# Patient Record
Sex: Female | Born: 1975 | Race: White | Hispanic: No | Marital: Single | Smoking: Current some day smoker
Health system: Southern US, Community
[De-identification: ages and names within clinical notes are randomized; demographics above are authoritative.]

## PROBLEM LIST (undated history)

## (undated) DIAGNOSIS — L0390 Cellulitis, unspecified: Secondary | ICD-10-CM

## (undated) DIAGNOSIS — I330 Acute and subacute infective endocarditis: Secondary | ICD-10-CM

## (undated) DIAGNOSIS — E039 Hypothyroidism, unspecified: Secondary | ICD-10-CM

## (undated) DIAGNOSIS — I2699 Other pulmonary embolism without acute cor pulmonale: Secondary | ICD-10-CM

## (undated) HISTORY — PX: OTHER SURGICAL HISTORY: SHX169

## (undated) HISTORY — PX: BELOW KNEE LEG AMPUTATION: SUR23

## (undated) HISTORY — PX: TOE AMPUTATION: SHX809

---

## 1999-02-07 ENCOUNTER — Encounter: Payer: Self-pay | Admitting: Emergency Medicine

## 1999-02-07 ENCOUNTER — Emergency Department (HOSPITAL_COMMUNITY): Admission: EM | Admit: 1999-02-07 | Discharge: 1999-02-07 | Payer: Self-pay | Admitting: Emergency Medicine

## 2001-03-10 ENCOUNTER — Inpatient Hospital Stay (HOSPITAL_COMMUNITY): Admission: EM | Admit: 2001-03-10 | Discharge: 2001-03-11 | Payer: Self-pay | Admitting: Emergency Medicine

## 2002-06-30 ENCOUNTER — Emergency Department (HOSPITAL_COMMUNITY): Admission: EM | Admit: 2002-06-30 | Discharge: 2002-06-30 | Payer: Self-pay | Admitting: Emergency Medicine

## 2002-06-30 ENCOUNTER — Encounter: Payer: Self-pay | Admitting: Emergency Medicine

## 2003-05-07 ENCOUNTER — Emergency Department (HOSPITAL_COMMUNITY): Admission: EM | Admit: 2003-05-07 | Discharge: 2003-05-08 | Payer: Self-pay | Admitting: Emergency Medicine

## 2003-05-08 ENCOUNTER — Encounter: Payer: Self-pay | Admitting: Emergency Medicine

## 2003-07-31 ENCOUNTER — Emergency Department (HOSPITAL_COMMUNITY): Admission: EM | Admit: 2003-07-31 | Discharge: 2003-07-31 | Payer: Self-pay | Admitting: Emergency Medicine

## 2003-08-05 ENCOUNTER — Emergency Department (HOSPITAL_COMMUNITY): Admission: AD | Admit: 2003-08-05 | Discharge: 2003-08-05 | Payer: Self-pay | Admitting: Family Medicine

## 2004-06-08 ENCOUNTER — Emergency Department (HOSPITAL_COMMUNITY): Admission: EM | Admit: 2004-06-08 | Discharge: 2004-06-08 | Payer: Self-pay | Admitting: Emergency Medicine

## 2004-06-13 ENCOUNTER — Inpatient Hospital Stay (HOSPITAL_COMMUNITY): Admission: EM | Admit: 2004-06-13 | Discharge: 2004-06-22 | Payer: Self-pay | Admitting: Emergency Medicine

## 2004-06-18 ENCOUNTER — Ambulatory Visit: Payer: Self-pay | Admitting: Infectious Diseases

## 2004-07-24 ENCOUNTER — Inpatient Hospital Stay (HOSPITAL_COMMUNITY): Admission: EM | Admit: 2004-07-24 | Discharge: 2004-07-30 | Payer: Self-pay | Admitting: Emergency Medicine

## 2004-07-24 ENCOUNTER — Ambulatory Visit: Payer: Self-pay | Admitting: Infectious Diseases

## 2005-12-07 ENCOUNTER — Emergency Department (HOSPITAL_COMMUNITY): Admission: EM | Admit: 2005-12-07 | Discharge: 2005-12-08 | Payer: Self-pay | Admitting: Emergency Medicine

## 2006-04-26 ENCOUNTER — Emergency Department (HOSPITAL_COMMUNITY): Admission: EM | Admit: 2006-04-26 | Discharge: 2006-04-26 | Payer: Self-pay | Admitting: Emergency Medicine

## 2006-05-15 ENCOUNTER — Emergency Department (HOSPITAL_COMMUNITY): Admission: EM | Admit: 2006-05-15 | Discharge: 2006-05-15 | Payer: Self-pay | Admitting: Emergency Medicine

## 2006-09-04 ENCOUNTER — Emergency Department (HOSPITAL_COMMUNITY): Admission: EM | Admit: 2006-09-04 | Discharge: 2006-09-04 | Payer: Self-pay | Admitting: Emergency Medicine

## 2006-09-06 ENCOUNTER — Emergency Department (HOSPITAL_COMMUNITY): Admission: EM | Admit: 2006-09-06 | Discharge: 2006-09-06 | Payer: Self-pay | Admitting: Emergency Medicine

## 2007-03-22 ENCOUNTER — Emergency Department (HOSPITAL_COMMUNITY): Admission: EM | Admit: 2007-03-22 | Discharge: 2007-03-22 | Payer: Self-pay | Admitting: Emergency Medicine

## 2007-05-07 ENCOUNTER — Emergency Department (HOSPITAL_COMMUNITY): Admission: EM | Admit: 2007-05-07 | Discharge: 2007-05-07 | Payer: Self-pay | Admitting: Emergency Medicine

## 2007-05-13 ENCOUNTER — Inpatient Hospital Stay (HOSPITAL_COMMUNITY): Admission: EM | Admit: 2007-05-13 | Discharge: 2007-05-24 | Payer: Self-pay | Admitting: Emergency Medicine

## 2007-05-17 ENCOUNTER — Ambulatory Visit: Payer: Self-pay | Admitting: Infectious Disease

## 2007-05-20 ENCOUNTER — Encounter (INDEPENDENT_AMBULATORY_CARE_PROVIDER_SITE_OTHER): Payer: Self-pay | Admitting: Surgery

## 2007-05-20 ENCOUNTER — Encounter (INDEPENDENT_AMBULATORY_CARE_PROVIDER_SITE_OTHER): Payer: Self-pay | Admitting: Internal Medicine

## 2007-05-20 ENCOUNTER — Ambulatory Visit: Payer: Self-pay | Admitting: Surgery

## 2007-05-21 ENCOUNTER — Encounter (INDEPENDENT_AMBULATORY_CARE_PROVIDER_SITE_OTHER): Payer: Self-pay | Admitting: *Deleted

## 2007-06-12 ENCOUNTER — Ambulatory Visit: Payer: Self-pay | Admitting: *Deleted

## 2007-06-12 ENCOUNTER — Encounter (INDEPENDENT_AMBULATORY_CARE_PROVIDER_SITE_OTHER): Payer: Self-pay | Admitting: Internal Medicine

## 2007-06-12 DIAGNOSIS — E119 Type 2 diabetes mellitus without complications: Secondary | ICD-10-CM | POA: Insufficient documentation

## 2007-06-12 DIAGNOSIS — E109 Type 1 diabetes mellitus without complications: Secondary | ICD-10-CM | POA: Insufficient documentation

## 2007-06-12 DIAGNOSIS — E1165 Type 2 diabetes mellitus with hyperglycemia: Secondary | ICD-10-CM

## 2007-06-12 DIAGNOSIS — E118 Type 2 diabetes mellitus with unspecified complications: Secondary | ICD-10-CM

## 2007-06-12 DIAGNOSIS — Z794 Long term (current) use of insulin: Secondary | ICD-10-CM | POA: Insufficient documentation

## 2007-06-12 DIAGNOSIS — D649 Anemia, unspecified: Secondary | ICD-10-CM | POA: Insufficient documentation

## 2007-06-12 DIAGNOSIS — E785 Hyperlipidemia, unspecified: Secondary | ICD-10-CM | POA: Insufficient documentation

## 2007-06-14 ENCOUNTER — Telehealth (INDEPENDENT_AMBULATORY_CARE_PROVIDER_SITE_OTHER): Payer: Self-pay | Admitting: *Deleted

## 2007-07-30 ENCOUNTER — Encounter: Admission: RE | Admit: 2007-07-30 | Discharge: 2007-07-30 | Payer: Self-pay | Admitting: Surgery

## 2007-12-11 ENCOUNTER — Emergency Department (HOSPITAL_COMMUNITY): Admission: EM | Admit: 2007-12-11 | Discharge: 2007-12-11 | Payer: Self-pay | Admitting: Emergency Medicine

## 2008-02-15 ENCOUNTER — Inpatient Hospital Stay (HOSPITAL_COMMUNITY): Admission: EM | Admit: 2008-02-15 | Discharge: 2008-02-18 | Payer: Self-pay | Admitting: Emergency Medicine

## 2008-02-27 ENCOUNTER — Telehealth: Payer: Self-pay | Admitting: *Deleted

## 2008-04-21 ENCOUNTER — Emergency Department (HOSPITAL_COMMUNITY): Admission: EM | Admit: 2008-04-21 | Discharge: 2008-04-21 | Payer: Self-pay | Admitting: Emergency Medicine

## 2008-04-29 ENCOUNTER — Inpatient Hospital Stay (HOSPITAL_COMMUNITY): Admission: EM | Admit: 2008-04-29 | Discharge: 2008-05-05 | Payer: Self-pay | Admitting: Emergency Medicine

## 2008-05-07 ENCOUNTER — Ambulatory Visit: Payer: Self-pay | Admitting: Family Medicine

## 2008-05-07 ENCOUNTER — Encounter (INDEPENDENT_AMBULATORY_CARE_PROVIDER_SITE_OTHER): Payer: Self-pay | Admitting: Adult Health

## 2008-05-07 LAB — CONVERTED CEMR LAB
INR: 2.7 — ABNORMAL HIGH (ref 0.0–1.5)
Prothrombin Time: 30.9 s — ABNORMAL HIGH (ref 11.6–15.2)

## 2008-06-11 ENCOUNTER — Telehealth (INDEPENDENT_AMBULATORY_CARE_PROVIDER_SITE_OTHER): Payer: Self-pay | Admitting: *Deleted

## 2008-09-30 ENCOUNTER — Inpatient Hospital Stay (HOSPITAL_COMMUNITY): Admission: EM | Admit: 2008-09-30 | Discharge: 2008-10-05 | Payer: Self-pay | Admitting: Emergency Medicine

## 2008-10-01 ENCOUNTER — Ambulatory Visit: Payer: Self-pay | Admitting: Surgery

## 2008-10-01 ENCOUNTER — Encounter (INDEPENDENT_AMBULATORY_CARE_PROVIDER_SITE_OTHER): Payer: Self-pay | Admitting: Internal Medicine

## 2009-03-12 ENCOUNTER — Inpatient Hospital Stay (HOSPITAL_COMMUNITY): Admission: EM | Admit: 2009-03-12 | Discharge: 2009-03-18 | Payer: Self-pay | Admitting: Emergency Medicine

## 2009-03-12 ENCOUNTER — Encounter (INDEPENDENT_AMBULATORY_CARE_PROVIDER_SITE_OTHER): Payer: Self-pay | Admitting: Internal Medicine

## 2009-03-13 ENCOUNTER — Encounter (INDEPENDENT_AMBULATORY_CARE_PROVIDER_SITE_OTHER): Payer: Self-pay | Admitting: Internal Medicine

## 2009-03-15 ENCOUNTER — Encounter (INDEPENDENT_AMBULATORY_CARE_PROVIDER_SITE_OTHER): Payer: Self-pay | Admitting: Internal Medicine

## 2009-03-15 ENCOUNTER — Ambulatory Visit: Payer: Self-pay | Admitting: Vascular Surgery

## 2010-08-14 ENCOUNTER — Encounter: Payer: Self-pay | Admitting: Surgery

## 2010-08-18 IMAGING — CR DG CHEST 2V
2 series · 2 of 2 positions shown · non-contrast
Comparison: Two-view chest x-ray 04/21/2008 and 12/07/2005.

CLINICAL DATA: Shortness of breath.  Wheezing.

CHEST - 2 VIEW 04/29/2008:

[w chest pa]
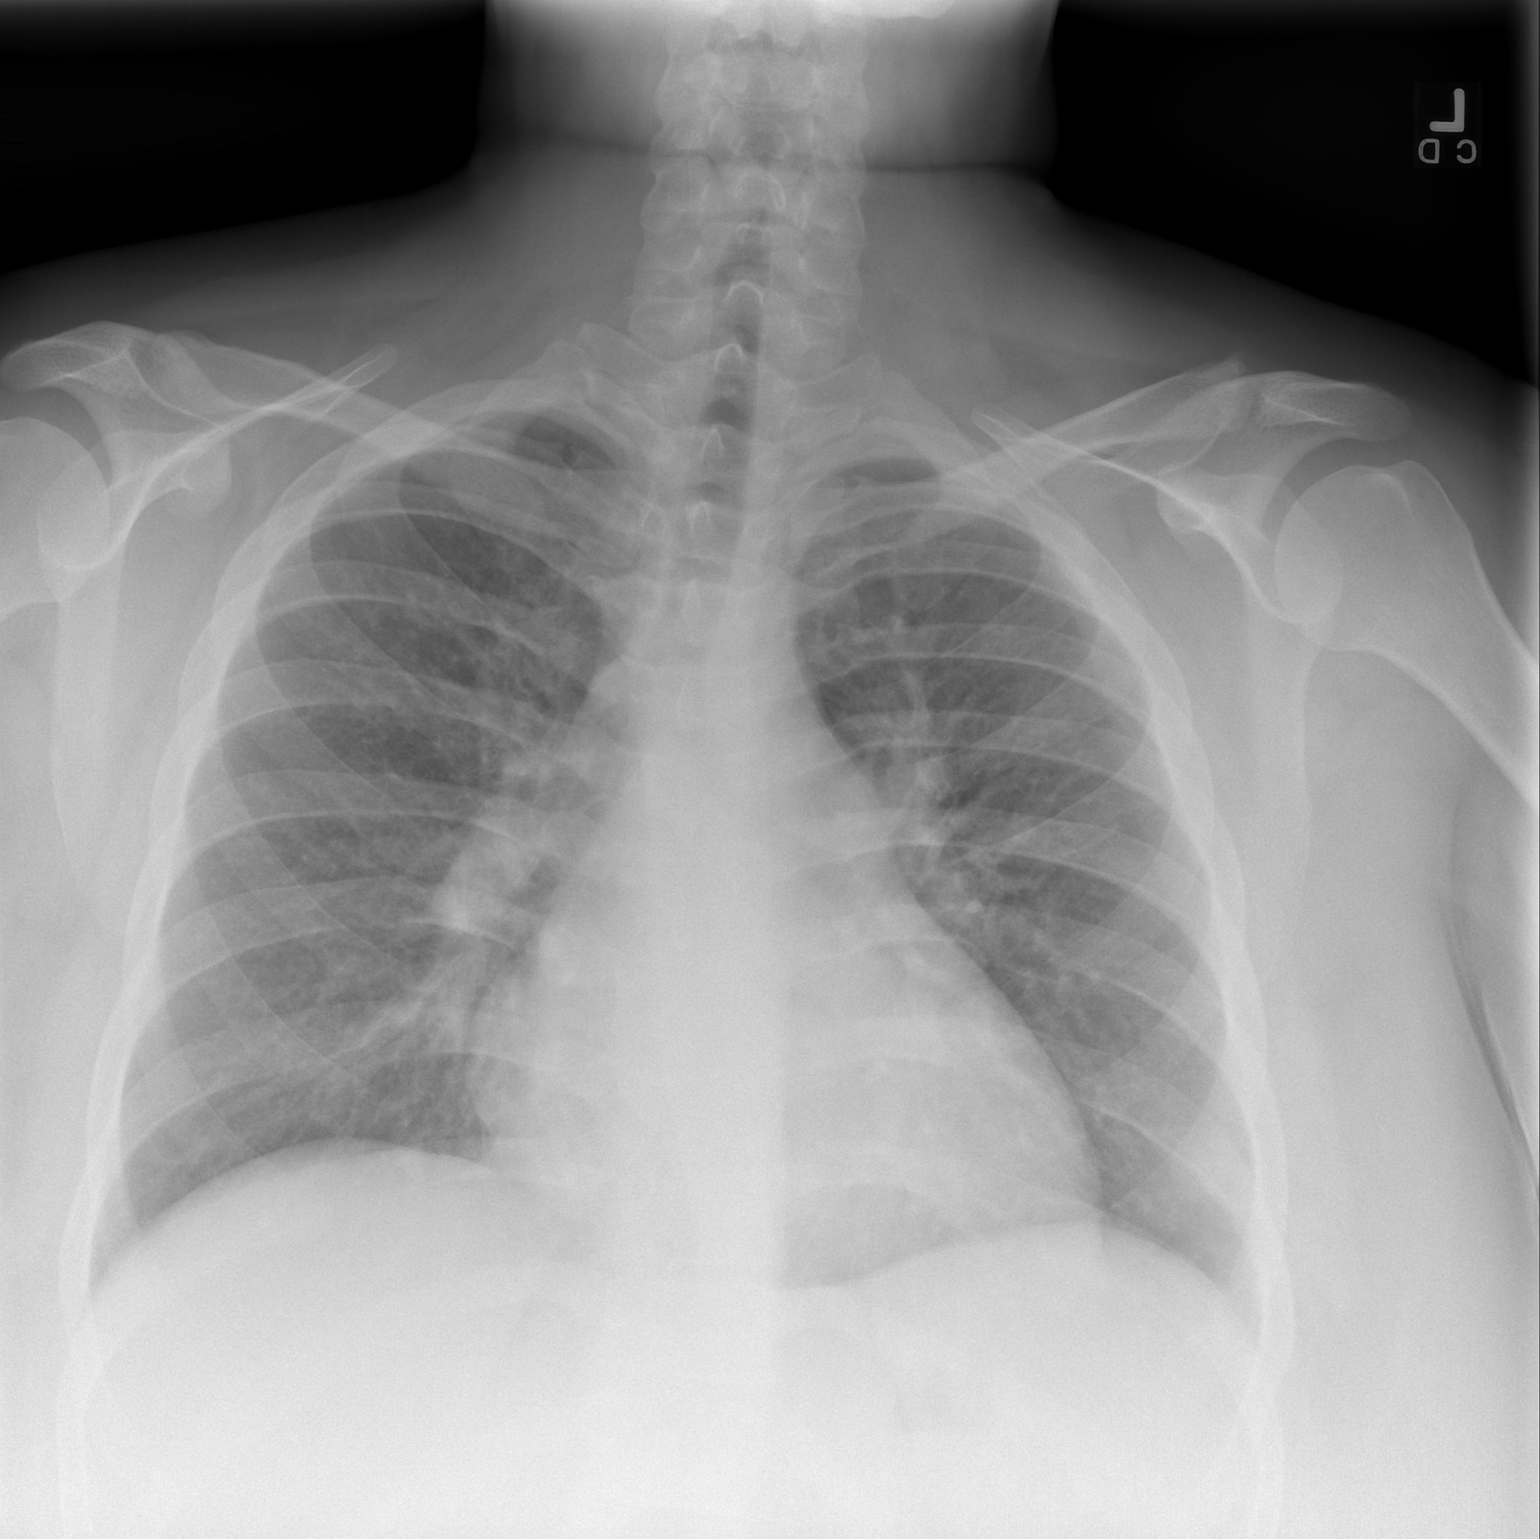

[w chest lat]
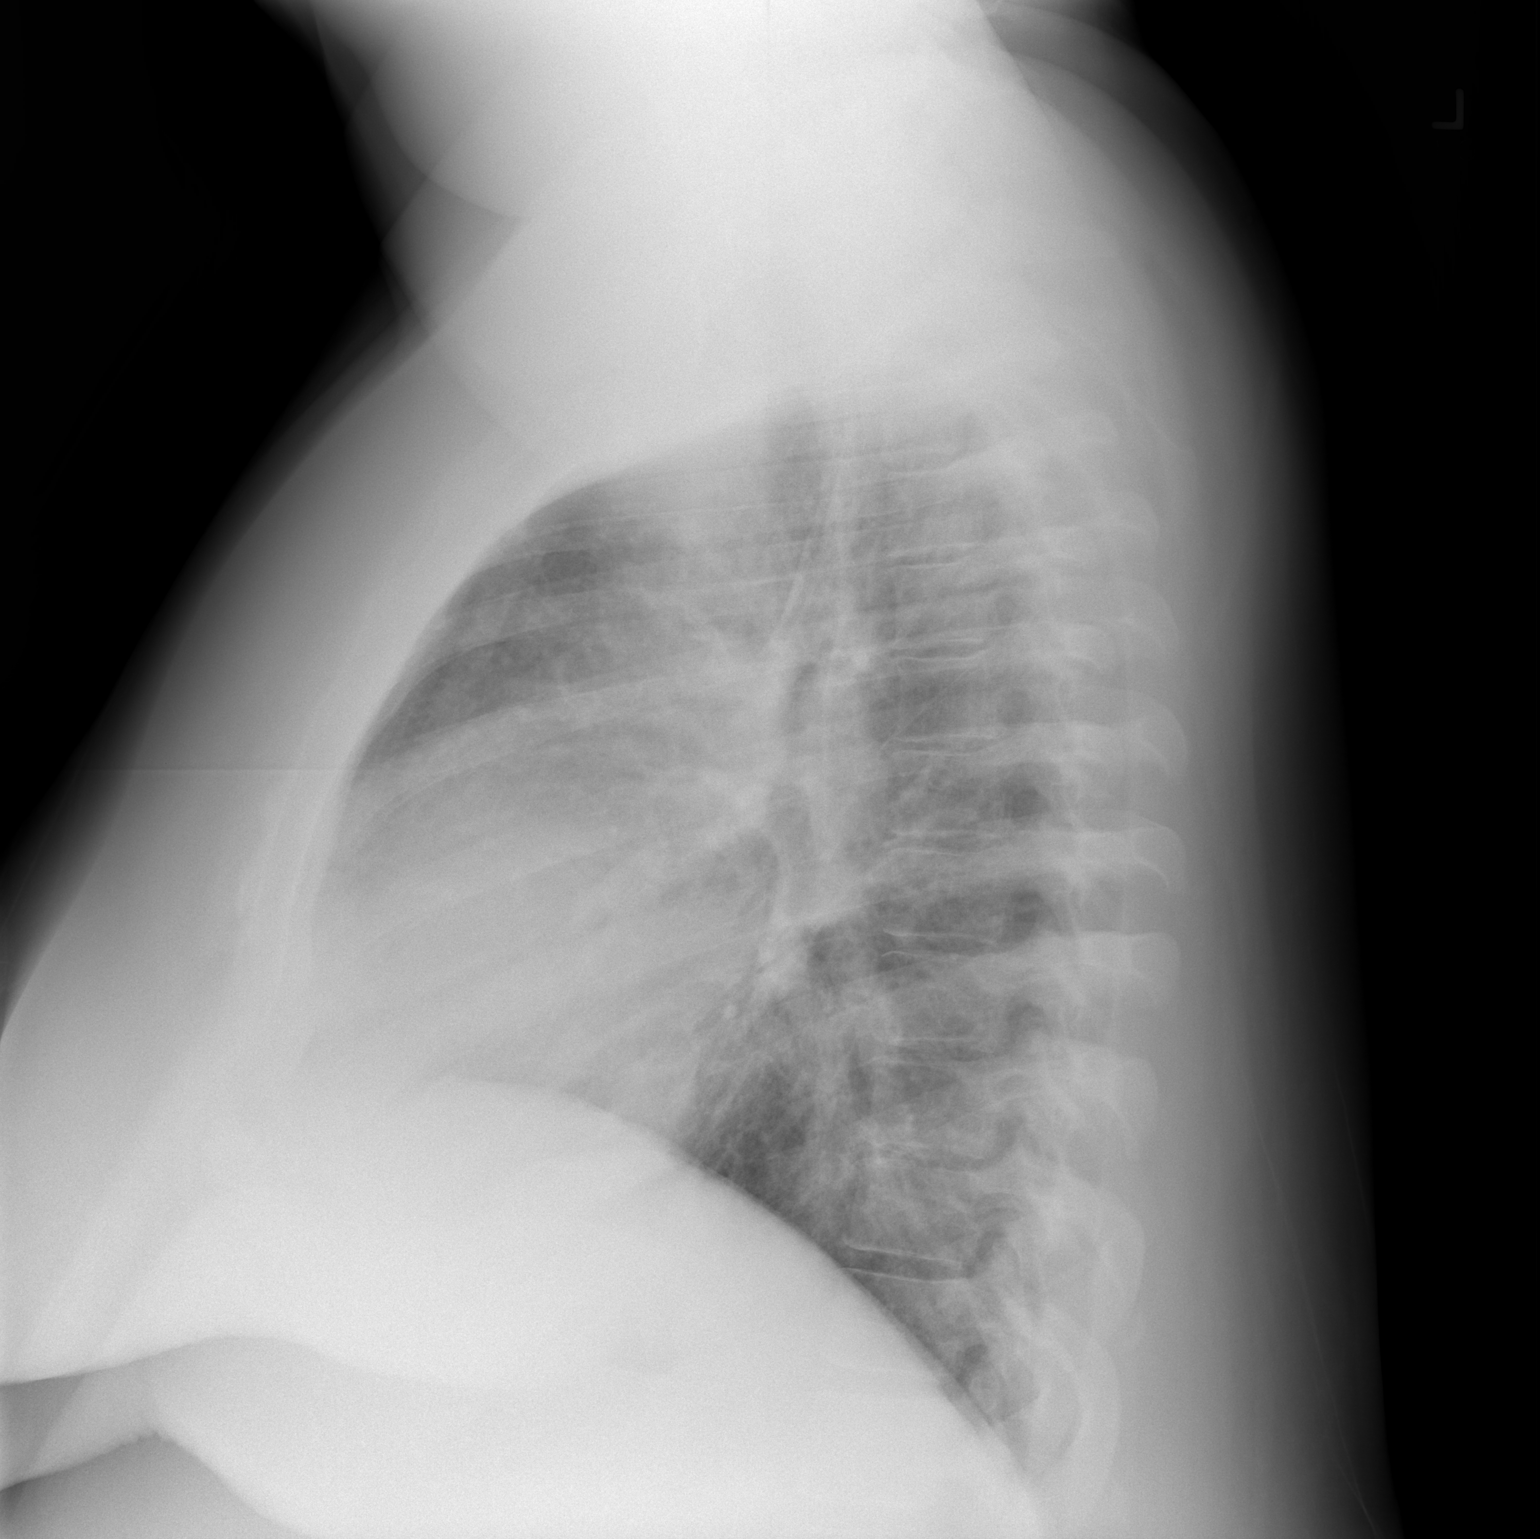

[2 of 2 positions shown; findings below may reference images not displayed]

FINDINGS: Cardiomediastinal silhouette unremarkable and unchanged.
Mildly prominent bronchovascular markings diffusely and mild
central peribronchial thickening, slightly more prominent than on
the prior examinations.  No localized airspace consolidation.  No
pleural effusions.  Visualized bony thorax intact.
IMPRESSION: Mild changes of acute asthma / bronchitis without localized
airspace pneumonia.

## 2010-10-29 LAB — GLUCOSE, CAPILLARY
Glucose-Capillary: 112 mg/dL — ABNORMAL HIGH (ref 70–99)
Glucose-Capillary: 117 mg/dL — ABNORMAL HIGH (ref 70–99)
Glucose-Capillary: 121 mg/dL — ABNORMAL HIGH (ref 70–99)
Glucose-Capillary: 124 mg/dL — ABNORMAL HIGH (ref 70–99)
Glucose-Capillary: 127 mg/dL — ABNORMAL HIGH (ref 70–99)
Glucose-Capillary: 152 mg/dL — ABNORMAL HIGH (ref 70–99)
Glucose-Capillary: 164 mg/dL — ABNORMAL HIGH (ref 70–99)
Glucose-Capillary: 174 mg/dL — ABNORMAL HIGH (ref 70–99)
Glucose-Capillary: 215 mg/dL — ABNORMAL HIGH (ref 70–99)

## 2010-10-29 LAB — URINALYSIS, ROUTINE W REFLEX MICROSCOPIC
Bilirubin Urine: NEGATIVE
Ketones, ur: NEGATIVE mg/dL
Ketones, ur: NEGATIVE mg/dL
Nitrite: NEGATIVE
Nitrite: NEGATIVE
Protein, ur: 100 mg/dL — AB
pH: 5.5 (ref 5.0–8.0)
pH: 5.5 (ref 5.0–8.0)

## 2010-10-29 LAB — COMPREHENSIVE METABOLIC PANEL
AST: 12 U/L (ref 0–37)
Albumin: 3 g/dL — ABNORMAL LOW (ref 3.5–5.2)
Alkaline Phosphatase: 46 U/L (ref 39–117)
Chloride: 97 mEq/L (ref 96–112)
GFR calc Af Amer: >60 mL/min (ref >60)
Potassium: 3.8 mEq/L (ref 3.5–5.1)
Total Bilirubin: 0.8 mg/dL (ref 0.3–1.2)
Total Protein: 7 g/dL (ref 6.0–8.3)

## 2010-10-29 LAB — POCT I-STAT, CHEM 8
Calcium, Ion: 1.12 mmol/L (ref 1.12–1.32)
Creatinine, Ser: 0.5 mg/dL (ref 0.4–1.2)
Glucose, Bld: 233 mg/dL — ABNORMAL HIGH (ref 70–99)
Hemoglobin: 14.3 g/dL (ref 12.0–15.0)
Potassium: 3.9 mEq/L (ref 3.5–5.1)

## 2010-10-29 LAB — DIFFERENTIAL
Basophils Relative: 1 % (ref 0–1)
Basophils Relative: 2 % — ABNORMAL HIGH (ref 0–1)
Lymphocytes Relative: 13 % (ref 12–46)
Lymphocytes Relative: 20 % (ref 12–46)
Lymphs Abs: 1.5 10*3/uL (ref 0.7–4.0)
Lymphs Abs: 1.8 10*3/uL (ref 0.7–4.0)
Monocytes Absolute: 0.6 10*3/uL (ref 0.1–1.0)
Monocytes Relative: 5 % (ref 3–12)
Monocytes Relative: 8 % (ref 3–12)
Neutro Abs: 6.3 10*3/uL (ref 1.7–7.7)
Neutro Abs: 9 10*3/uL — ABNORMAL HIGH (ref 1.7–7.7)
Neutrophils Relative %: 69 % (ref 43–77)
Neutrophils Relative %: 78 % — ABNORMAL HIGH (ref 43–77)

## 2010-10-29 LAB — BASIC METABOLIC PANEL
BUN: 7 mg/dL (ref 6–23)
CO2: 28 mEq/L (ref 19–32)
Calcium: 8.6 mg/dL (ref 8.4–10.5)
Calcium: 8.7 mg/dL (ref 8.4–10.5)
Creatinine, Ser: 0.76 mg/dL (ref 0.4–1.2)
Creatinine, Ser: 0.88 mg/dL (ref 0.4–1.2)
GFR calc Af Amer: >60 mL/min (ref >60)
GFR calc non Af Amer: >60 mL/min (ref >60)
GFR calc non Af Amer: >60 mL/min (ref >60)
Glucose, Bld: 133 mg/dL — ABNORMAL HIGH (ref 70–99)
Sodium: 135 mEq/L (ref 135–145)

## 2010-10-29 LAB — CULTURE, ROUTINE-ABSCESS

## 2010-10-29 LAB — CBC
Hemoglobin: 14.2 g/dL (ref 12.0–15.0)
MCHC: 34.8 g/dL (ref 30.0–36.0)
Platelets: 267 10*3/uL (ref 150–400)
RBC: 4.16 MIL/uL (ref 3.87–5.11)
RBC: 4.52 MIL/uL (ref 3.87–5.11)
RDW: 12.4 % (ref 11.5–15.5)
WBC: 11.5 10*3/uL — ABNORMAL HIGH (ref 4.0–10.5)
WBC: 9.1 10*3/uL (ref 4.0–10.5)

## 2010-10-29 LAB — LIPID PANEL
Cholesterol: 178 mg/dL (ref 0–200)
HDL: 23 mg/dL — ABNORMAL LOW (ref >39)
LDL Cholesterol: 96 mg/dL (ref 0–99)
Total CHOL/HDL Ratio: 7.7 RATIO
Triglycerides: 297 mg/dL — ABNORMAL HIGH (ref <150)
VLDL: UNDETERMINED mg/dL (ref 0–40)

## 2010-10-29 LAB — CULTURE, BLOOD (ROUTINE X 2): Culture: NO GROWTH

## 2010-10-29 LAB — URINE CULTURE
Colony Count: NO GROWTH
Culture: NO GROWTH

## 2010-10-29 LAB — WOUND CULTURE

## 2010-10-29 LAB — URINE MICROSCOPIC-ADD ON

## 2010-10-29 LAB — PROTIME-INR
INR: 0.9 (ref 0.00–1.49)
Prothrombin Time: 12.4 seconds (ref 11.6–15.2)

## 2010-10-29 LAB — ANAEROBIC CULTURE

## 2010-10-29 LAB — HEMOGLOBIN A1C: Mean Plasma Glucose: 186 mg/dL

## 2010-10-29 LAB — APTT: aPTT: 25 seconds (ref 24–37)

## 2010-11-03 LAB — CBC
HCT: 40.5 % (ref 36.0–46.0)
Hemoglobin: 12.6 g/dL (ref 12.0–15.0)
MCHC: 35.7 g/dL (ref 30.0–36.0)
MCHC: 36 g/dL (ref 30.0–36.0)
MCV: 89.9 fL (ref 78.0–100.0)
MCV: 90.7 fL (ref 78.0–100.0)
Platelets: 221 10*3/uL (ref 150–400)
RBC: 3.89 MIL/uL (ref 3.87–5.11)
RBC: 4.51 MIL/uL (ref 3.87–5.11)
RDW: 12.6 % (ref 11.5–15.5)
WBC: 10.8 10*3/uL — ABNORMAL HIGH (ref 4.0–10.5)

## 2010-11-03 LAB — URINALYSIS, ROUTINE W REFLEX MICROSCOPIC
Glucose, UA: >1000 mg/dL — AB
Hgb urine dipstick: NEGATIVE
Ketones, ur: NEGATIVE mg/dL
Protein, ur: NEGATIVE mg/dL
pH: 5.5 (ref 5.0–8.0)

## 2010-11-03 LAB — BASIC METABOLIC PANEL
BUN: 3 mg/dL — ABNORMAL LOW (ref 6–23)
CO2: 25 mEq/L (ref 19–32)
CO2: 25 mEq/L (ref 19–32)
Calcium: 8.4 mg/dL (ref 8.4–10.5)
Chloride: 103 mEq/L (ref 96–112)
Chloride: 93 mEq/L — ABNORMAL LOW (ref 96–112)
Chloride: 96 mEq/L (ref 96–112)
Creatinine, Ser: 0.33 mg/dL — ABNORMAL LOW (ref 0.4–1.2)
Creatinine, Ser: 0.44 mg/dL (ref 0.4–1.2)
Creatinine, Ser: 0.51 mg/dL (ref 0.4–1.2)
GFR calc Af Amer: >60 mL/min (ref >60)
GFR calc Af Amer: >60 mL/min (ref >60)
GFR calc Af Amer: >60 mL/min (ref >60)
GFR calc non Af Amer: >60 mL/min (ref >60)
Glucose, Bld: 401 mg/dL — ABNORMAL HIGH (ref 70–99)
Potassium: 3.9 mEq/L (ref 3.5–5.1)
Potassium: 4.6 mEq/L (ref 3.5–5.1)
Sodium: 123 mEq/L — ABNORMAL LOW (ref 135–145)
Sodium: 126 mEq/L — ABNORMAL LOW (ref 135–145)
Sodium: 127 mEq/L — ABNORMAL LOW (ref 135–145)

## 2010-11-03 LAB — GLUCOSE, CAPILLARY
Glucose-Capillary: 184 mg/dL — ABNORMAL HIGH (ref 70–99)
Glucose-Capillary: 213 mg/dL — ABNORMAL HIGH (ref 70–99)
Glucose-Capillary: 224 mg/dL — ABNORMAL HIGH (ref 70–99)
Glucose-Capillary: 233 mg/dL — ABNORMAL HIGH (ref 70–99)
Glucose-Capillary: 241 mg/dL — ABNORMAL HIGH (ref 70–99)
Glucose-Capillary: 251 mg/dL — ABNORMAL HIGH (ref 70–99)
Glucose-Capillary: 260 mg/dL — ABNORMAL HIGH (ref 70–99)
Glucose-Capillary: 281 mg/dL — ABNORMAL HIGH (ref 70–99)
Glucose-Capillary: 301 mg/dL — ABNORMAL HIGH (ref 70–99)
Glucose-Capillary: 331 mg/dL — ABNORMAL HIGH (ref 70–99)
Glucose-Capillary: 424 mg/dL — ABNORMAL HIGH (ref 70–99)

## 2010-11-03 LAB — COMPREHENSIVE METABOLIC PANEL
CO2: 23 mEq/L (ref 19–32)
Calcium: 8.4 mg/dL (ref 8.4–10.5)
Creatinine, Ser: 0.67 mg/dL (ref 0.4–1.2)
GFR calc non Af Amer: >60 mL/min (ref >60)
Glucose, Bld: 283 mg/dL — ABNORMAL HIGH (ref 70–99)

## 2010-11-03 LAB — CULTURE, BLOOD (ROUTINE X 2): Culture: NO GROWTH

## 2010-11-03 LAB — DIFFERENTIAL
Eosinophils Absolute: 0.2 10*3/uL (ref 0.0–0.7)
Eosinophils Relative: 2 % (ref 0–5)
Lymphocytes Relative: 17 % (ref 12–46)
Lymphs Abs: 1.8 10*3/uL (ref 0.7–4.0)
Monocytes Relative: 3 % (ref 3–12)

## 2010-11-03 LAB — URINE MICROSCOPIC-ADD ON

## 2010-11-03 LAB — RAPID URINE DRUG SCREEN, HOSP PERFORMED
Benzodiazepines: NOT DETECTED
Cocaine: NOT DETECTED
Opiates: POSITIVE — AB

## 2010-11-03 LAB — MAGNESIUM: Magnesium: 2 mg/dL (ref 1.5–2.5)

## 2010-11-03 LAB — CORTISOL: Cortisol, Plasma: 14.2 ug/dL

## 2010-11-03 LAB — TSH: TSH: 2.427 u[IU]/mL (ref 0.350–4.500)

## 2010-12-06 NOTE — H&P (Signed)
NAME:  Karen Bennett, Karen Bennett NO.:  [id number]   MEDICAL RECORD NO.:  [id number]          PATIENT TYPE:  EMS   LOCATION:  ED                           FACILITY:  [facility]   PHYSICIAN:  Arne Cleveland, MD       DATE OF BIRTH:  May 08, 1976   DATE OF ADMISSION:  03/12/2009  DATE OF DISCHARGE:                              HISTORY & PHYSICAL   CHIEF COMPLAINT:  Left foot abscess.   HISTORY OF PRESENT ILLNESS:  Karen Bennett is a 35 year old Caucasian  female who noticed some pain in her left foot which she thought was  reactivation of left fourth and fifth metatarsal fractures.  This  started about 2 months ago.  She put an Ace bandage on her foot and wore  her boot that she was given when she was treated for this in March 2010.  She then started noticing a slight raw area a few weeks later, so she  stopped wearing the boot and only used the Ace bandage.  Two days ago  she developed swelling and pain in the left foot and then last  night/early this morning she had an ulceration on the lateral part of  her left foot over the fifth metatarsal and on the sole under the fifth  metatarsal with drainage and her left fifth toe developed a slight blue  blister with small area of ulceration and draining.  The patient came to  the emergency room because of pain and the abscess draining.   PAST MEDICAL HISTORY:  1. Significant for morbid obesity.  2. Diabetes type 2 which she is not compliant with because she states      she has no insurance.   PAST SURGICAL HISTORY:  Only surgery she had was recently when she broke  her left fifth and fourth metatarsal.  She developed swelling in her  left lower leg and a large hematoma which later became an abscess.  She  had incision and drainage and a tube was put into the abscess cavity and  a wound vac was used to treat her infection.  That is the only surgery  that she has had.   FAMILY HISTORY:  Significant for heart disease and diabetes.  Her  mother  recently had a stroke and a heart attack and is currently at a nursing  facility and recuperating from that.  During that time her father was  found dead at their home and the cause is not sure, but they feel  complications of diabetes.  The patient has been under a lot of anxiety  and stress because of that.   DRUG ALLERGIES:  DOXYCYCLINE causes nausea.   MEDICATIONS:  She only takes over-the-counter ibuprofen.  She does not  take anything for her diabetes.   SOCIAL HISTORY:  She is single.  Her dad recently expired.  Her mom has  had a stroke and heart attack.   REVIEW OF SYSTEMS:  Other than the findings in the history of present  illness, the patient's review of systems is only positive for chronic  medical problems.  No other  positive findings on review of systems.   PHYSICAL EXAMINATION:  GENERAL APPEARANCE:  She is anxious, morbidly  obese in mild distress secondary to pain Caucasian female lying in the  bed.  VITALS:  Temperature 98.4, pulse 102, blood pressure 183/116,  respirations 18.  HEENT:  Head is atraumatic, normocephalic.  Eyes: Pupils are equal,  round and reactive to light.  Disks sharp.  Ears:  External ear canals  and tympanic membranes are normal.  Oropharynx basically normal.  She  does have a tongue piercing.  NECK:  Supple without jugular venous distention, thyromegaly, thyroid  mass or carotid bruit.  CHEST:  Normal to inspection and palpation.  BREASTS/PELVIC/RECTAL:  Exam deferred at the patient's request.  HEART:  Regular rhythm and rate.  Normal S1, S2 without murmur or  gallop.  ABDOMEN:  Morbidly obese, soft, nontender with normoactive bowel sounds.  No hepatomegaly.  No splenomegaly.  No palpable mass.  SKIN/EXTREMITIES:  Normal except for the left foot.  Both legs have  trace edema.  Left leg has more, 1+ from the knee down.  Her left foot  is swollen almost to the ankle.  She has an ulcer on the top of her  fifth metatarsal and also in  the same location, at the base or sole over  the fifth metatarsal.  There is drainage coming from the ulcer and her  fifth toe has a large hematoma bleb and a small area of draining  ulceration.   X-ray of her left foot shows findings of soft tissue ulceration  laterally at the level of the distal fifth metatarsal, subacute healing  fractures involving fourth and fifth metatarsals, marked diffuse tissue  swelling.  No osteolysis involving the fifth metatarsal or phalange of  the fifth toe to suggest osteomyelitis currently.  Joint space is well  preserved.  Spurring arising from the dorsal aspect of the distal  calcification at the insertion of the Achilles tendon posterior  calcaneus.  Impression:  Soft tissue ulceration lateral left fifth  metatarsal, no radiographic evidence of osteomyelitis and subacute  healing fractures of the fourth and fifth metatarsal.   LABORATORY:  CBC:  White count 11.5, hemoglobin 14.3, hematocrit 43,  platelets 114, left shift with 78% neutrophils.  Sed rate is 43.   IMPRESSION:  1. Left foot cellulitis and abscess formation with cellulitis and dark      blue bleb formation and ulceration the left fifth toe.  2. Diabetes non control.  3. Morbid obesity.  4. Tobacco use.   PLAN:  1. Obtain Doppler of the left leg to rule out deep venous thrombosis.  2. Obtain arterial Dopplers of the left leg to assess circulation.  3. MRI of the left foot to rule out osteomyelitis.  4. Obtain surgical evaluation for evaluation and treatment of the left      foot abscess.  5. Start the patient on a 1800 calorie ADA diet.  6. Start the patient on sliding scale with Accu-Cheks before meals and      at bedtime.   The patient is being admitted to the C Team.      Arne Cleveland, MD  Electronically Signed     ML/MEDQ  D:  03/12/2009  T:  03/12/2009  Job:  [id number]

## 2010-12-06 NOTE — Discharge Summary (Signed)
NAME:  Karen Bennett, Karen Bennett               ACCOUNT NO.:  [id number]   MEDICAL RECORD NO.:  [id number]          PATIENT TYPE:  INP   LOCATION:  1318                         FACILITY:  [facility]   PHYSICIAN:  Lonia Blood, M.D.DATE OF BIRTH:  July 21, 1976   DATE OF ADMISSION:  03/12/2009  DATE OF DISCHARGE:  03/18/2009                               DISCHARGE SUMMARY   PRIMARY CARE PHYSICIAN:  Unassigned.   DISCHARGE DIAGNOSES:  1. 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      a.     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      b.     Preoperative vancomycin and Zosyn empirically.      c.     Wound culture from March 12, 2009, confirming methicillin-       resistant staphylococcus aureus (MRSA) susceptible to clindamycin.      d.     Transition to clindamycin oral prior to discharge home.  2. Severely uncontrolled diabetes.      a.     Noncompliance secondary to financial constraints.      b.     CBG well-controlled during hospital stay.      c.     Outpatient referral to diabetes education and assistance       with procurement of medications.  3. Morbid obesity.  4. Reported history of hypothyroidism with normal TSH and free T4      during hospital stay.  5. History of tobacco and marijuana abuse.  The patient advised to      come absolutely discontinue both during hospital stay.  6. Pyuria.  Urine culture and to the urine.  Urinalysis suggestive of      candidal vulvovaginitis.  Empiric treatment.  7. Hypertriglyceridemia.  Treatment Initiated.   DISCHARGE MEDICATIONS:  1. Glucotrol 10 mg p.o. b.i.d.  2. Glucophage 500 mg p.o. b.i.d.  3  Zocor 20 mg p.o. q.h.s.  1. Clindamycin 300 mg p.o. q.8h.  Dose subject to change  2. Oxycodone 5 mg 1 to 2 tablets p.o. q.4h. p.r.n. severe pain.  3. Tylenol 650 mg p.o. q.4h. p.r.n. pain.   FOLLOW UP:  1. Clinical social work has been consulted to assist the patient in   obtaining a primary care physician.  This will be of critical      importance as the patient's CBG's must be very strictly controlled      and followed to assure maximal wound healing.  Additionally, the      patient will need follow-up LFTs and lipid assessment in 6 to 8      weeks, as Zocor therapy has been initiated.  2. The patient will follow up with Dr. Aldean Baker, her orthopedic      surgeon, in 1 to 2 weeks.  She is to call his office at [phone].  3. Wound care:  The patient is instructed to keep her current dressing      intact until she follows up with Dr. Lajoyce Corners in  1 to 2 weeks, as      discussed above.   PROCEDURE:  1. CT scan of the left lower extremity on March 14, 2009, forefoot      cellulitis with probable small superficial abscess dorsal and      lateral to the mid fifth metatarsal.  Effusion and synovial      enhancement of the fifth metatarsal phalangeal joint suspicious for      infection.  Early osteomyelitis of the fifth metatarsal head cannot      be excluded.  Healing fractures of the fourth and fifth metatarsal      shafts.  2. ABIs of bilateral lower extremities, March 15, 2009.  ABIs greater      than 1.0 bilaterally.  3  Left lower extremity venous Doppler, March 15, 2009.  No obvious  DVT.   HOSPITAL COURSE:  [name] is a pleasant 35 year old female,  who secondary to severe financial restraints, has suffered with severe  uncontrolled diabetes.  Additionally, she suffered an injury in March  2010 which was diagnosed as left fourth and fifth metatarsal fractures.  Approximately 2 months prior to her admission in the hospital, she began  to experience significant pain on the lateral aspect of her left foot.  She began to treat this at home with different wraps and wearing a  pressure-relieving blue.  A few weeks later, she began to notice  significant erythema of the lateral foot 2 days prior to her admission.  This progressed to include  severe pain as well as ulceration of the  lateral aspect of the foot and the fifth metatarsal.  She then presented  to the [facility] Emergency Room.  The patient was felt to be suffering  with significant diabetic foot infection.  Wound cultures were  accomplished.  The patient was found to be suffering with MRSA  cellulitis.  The patient was admitted to the acute units.  She was  treated empirically with vancomycin and Zosyn.  Diabetes medications  were adjusted to assure for a strict blood sugar control.  Given the  patient's history of hypothyroidism, a TSH and free T4 was assessed.  These were found to be well within the normal range without the patient  on replacement medications for over a year.  Attempts were made to  accomplish an MRI, but because of the patient's morbid obesity, this was  not able to be accomplished.  CT scan of the foot was therefore  accomplished.  Results are as noted above.  ABIs and left lower  extremity venous Dopplers were carried out with results as noted above.  Ultimately, it was felt that the patient would need surgical  debridement.  Dr. Aldean Baker of orthopedic surgery was consulted.  Dr.  Lajoyce Corners took the patient to the OR on March 16, 2009 and ultimately  performed a left foot fourth and fifth ray amputation.  The patient  tolerated the procedure very well.  CBGs were strictly controlled in the  perioperative period.  The patient improved clinically on her empiric  antibiotic therapy.   At the present time, the patient is medically stable.  She is afebrile  and tolerating a regular diet.  CBGs are much improved.  The patient is  suffering some pain, but this is currently controllable.  The patient  will be cleared for discharge home once it is proven that she will  remain afebrile on oral antibiotics.  These are being initiated tonight.  Additionally, Social  Work will be consulted to assist with outpatient  follow-up and assure that the patient  has all assistance available to  procure her medications and appropriate medical care.  As noted above,  hypertriglyceridemia was noted during this hospital stay with  triglyceride levels in excess of 400.  As a result, Zocor therapy has  been initiated.   It is anticipated that Ms. Keegan will be discharged the hospital on  March 18, 2009.  Any medication adjustments from those listed above  will be dictated in a follow-up addendum as necessary.      Lonia Blood, M.D.  Electronically Signed     JTM/MEDQ  D:  03/17/2009  T:  03/17/2009  Job:  [id number]

## 2010-12-06 NOTE — Discharge Summary (Signed)
NAME:  Karen Bennett, Karen Bennett               ACCOUNT NO.:  [id number]   MEDICAL RECORD NO.:  [id number]          PATIENT TYPE:  INP   LOCATION:  1536                         FACILITY:  [facility]   PHYSICIAN:  Hind I Elsaid, MD      DATE OF BIRTH:  10/01/1975   DATE OF ADMISSION:  02/15/2008  DATE OF DISCHARGE:  02/18/2008                               DISCHARGE SUMMARY   DISCHARGE DIAGNOSES:  1. Acute pyelonephritis/urinary tract infection.  2. Diabetes mellitus, uncontrolled with hemoglobin C1 of 11.  3. Nausea and vomiting, resolved.  4. Constipation, resolved.  5. Hypertension.  6. Morbid obesity.  7. Right hip pain, improving, felt to be secondary to sciatica or      nerve compression.   DISCHARGE MEDICATIONS:  1. Cipro 500 mg p.o. q.12 h for 4 days.  2. Insulin/Lantus 25 units subcu q.h.s.  3. Lisinopril 10 mg daily.  4. Darvocet 1-2 tablets p.o. q.6 h for two weeks.  5. Ibuprofen 400 mg q.6 h p.r.n.  6. Laxative.   PROCEDURES:  Abdominal x-ray, no acute findings.   HISTORY OF PRESENT ILLNESS:  This is a 35 year old Caucasian female,  morbidly obese, presented with chief complaint of flank/abdominal pain.  Also, with nausea and vomiting and constipation.  I planned to do a CT  of the abdomen and pelvis, but secondary to overweight could not be  done.  Found to have blood sugars of 304.  Patient admitted for further  evaluation.  1. The patient admitted as the case of UTI/viral nephritis.  Placed on      Cipro.  Urinalysis and culture were collected.  It was      insufficient.  Patient placed on Cipro with complete resolution of      her symptoms.  The patient will be treated empirically with Cipro      p.o. for another four days.  During hospitalization, the patient      has no further abdominal pain and we did not feel a need for CT of      abdomen and pelvis as necessary.  The patient had blood workup that      was completely normal with normal lactic acid and normal liver  function.  2. Diabetes mellitus.  The patient was found to have significantly      high CBG with hemoglobin C1 of 11.  The patient apparently was      diagnosed in 2008, but she is not on any medication.  Diabetic      coordinator contacted, and diabetic teaching was done during      hospitalization.  The patient, also, will have an appointment with      [facility] for further management of her hyperglycemia.  Diabetic      education was done during hospitalization.  3. Obesity.  Nutrition and dietician consultation was done, also      during hospitalization.  4. Constipation which has resolved.  The patient had good bowel      movement during hospital stay.  5. Hyponatremia.  Completely resolved during hospitalization.  Sodium  level today is 131.  6. Right hip pain.  No further workup was done for that, and the      patient will receive pain medication, mainly Darvocet and      ibuprofen.  We feel that the right hip pain secondary to overweight      with possibility of      osteoarthritis or degenerative joint.  There are no white blood      cells or fever, and the pain is really very slight in nature.  The      patient was asked to follow with [facility] if pain continued.   DISPOSITION:  We felt that the patient was medically stable to be  discharged home.      Hind Bosie Helper, MD  Electronically Signed     HIE/MEDQ  D:  02/18/2008  T:  02/18/2008  Job:  [id number]

## 2010-12-06 NOTE — Discharge Summary (Signed)
NAME:  Karen Bennett, TONKINSON                ACCOUNT NO.:  [id number]   MEDICAL RECORD NO.:  [id number]          PATIENT TYPE:  INP   LOCATION:  1538                         [facility]ACILITY:  [facility]   PHYSICIAN:  Herbie Saxon, MDDATE OF BIRTH:  1976-03-11   DATE OF ADMISSION:  05/12/2007  DATE OF DISCHARGE:                               DISCHARGE SUMMARY   ADDENDUM:  This is an Addendum to the Interim Discharge Summary for [facility]   DISCHARGE DIAGNOSES:  Will include:  1. Left thigh cellulitis, rule out underlying fluid collection or      abscess, rule out underlying deep vein thrombosis.  2. Morbid obesity.  3. Diabetes mellitus.  4. Vulvovaginitis.  5. Anemia of chronic disease.  6. History of methicillin-resistant Staphylococcus aureus infection,      status post incision and drainage left thigh.  7. History of tobacco abuse.  8. Dietary indiscretion.  9. Dyslipidemia.      Herbie Saxon, MD  Electronically Signed     MIO/MEDQ  D:  05/19/2007  T:  05/19/2007  Job:  [id number]

## 2010-12-06 NOTE — Op Note (Signed)
NAME:  Karen Bennett, Karen Bennett               ACCOUNT NO.:  [id number]   MEDICAL RECORD NO.:  [id number]          PATIENT TYPE:  INP   LOCATION:  1318                         FACILITY:  [facility]   PHYSICIAN:  Nadara Mustard, MD     DATE OF BIRTH:  1976-05-29   DATE OF PROCEDURE:  03/16/2009  DATE OF DISCHARGE:                               OPERATIVE REPORT   PREOPERATIVE DIAGNOSIS:  Abscess osteomyelitis, left foot.   POSTOPERATIVE DIAGNOSIS:  Abscess osteomyelitis, left foot.   PROCEDURE:  1. Left foot fifth ray amputation.  2. Left foot fourth ray amputation.   ANESTHESIA:  General.   ESTIMATED BLOOD LOSS:  Minimal.   ANTIBIOTICS:  Vancomycin and Zosyn preoperatively.   DRAINS:  None.   COMPLICATIONS:  None.   TOURNIQUET TIME:  None.   DISPOSITION:  To PACU in stable condition.   PROCEDURE:  The patient is a 35 year old woman with diabetic insensate  neuropathy who has had a several-month history and ulcer over the  plantar lateral aspect of the left foot beneath the fifth metatarsal  head.  The patient was admitted on 08/20, started on IV antibiotics to  decrease the cellulitis ascending up her leg and once the cellulitis was  stabilized, the patient presents at this time for surgical intervention.  Risks and benefits of surgery were discussed including persistent  infection, neurovascular injury, nonhealing wound, need for a higher-  level amputation.  The patient states she understands and wishes to  proceed at this time.  Her BMI is 69.   DESCRIPTION OF PROCEDURE:  The patient was brought to OR room two and  underwent a general LMA anesthetic.  After an adequate level of  anesthesia was obtained the patient's left lower extremity was prepped  using DuraPrep and draped into a sterile field.  The fifth ray was  amputated with the ulcers and one block of tissue.  There was a deep  abscess and necrosis of the fifth metatarsal head with complete  destruction of the bone from the  chronic osteomyelitis.  Deep cultures  were obtained.  There was a section that extended over to the fourth  metatarsal.  Due to the extension of the abscess of the fourth  metatarsal the fourth ray was also amputated.  The wound was irrigated  with normal saline.  After the amputation there was good bleeding and  granulation tissue in the wound bed and no evidence of any further  abscess.  The wound was then closed using 2-0 nylon with a far-near-near-  far suture.  The wound approximated well without tension on the skin.  The wound was covered with Adaptic, orthopedic sponges,  AB dressing, Webril and a Coban dressing was wrapped from her proximal  tibia to the toes.  The patient was then extubated, taken to PACU in  stable condition.  Plan for physical therapy, progressive ambulation  with a Darco shoe, touchdown weightbearing on the left.      Nadara Mustard, MD  Electronically Signed     MVD/MEDQ  D:  03/16/2009  T:  03/16/2009  Job:  [id number] 

## 2010-12-06 NOTE — Consult Note (Signed)
NAME:  Karen Bennett, AUER                ACCOUNT NO.:  [id number]   MEDICAL RECORD NO.:  [id number]          PATIENT TYPE:  EMS   LOCATION:  ED                           FACILITY:  [facility]   PHYSICIAN:  Alfonse Ras, MD   DATE OF BIRTH:  04-04-1976   DATE OF CONSULTATION:  03/22/2007  DATE OF DISCHARGE:                                 CONSULTATION   CHIEF COMPLAINT:  Abdominal pain.   HISTORY OF PRESENT ILLNESS:  The patient is an extremely, morbidly obese  35 year old white female, who comes in with a 3 day history of abdominal  pain on the right side radiating to her right flank.  She was seen by  Dr. Deretha Emory, and I was asked to further evaluate the patient to rule  out appendicitis.  The patient was weighed and was too large to be  weighed because she weighed over 450 pounds.  She describes her pain as  in the right mid abdomen radiating to her right flank.  She denies any  acholic stools or dark urine.  She denies any fevers at home.  She has  been having some nausea and vomiting at home.   PAST MEDICAL HISTORY:  Is none.   MEDICATIONS:  Are none.   LABORATORY DATA:  Laboratory values show a white count 11.8 thousand,  hemoglobin of 15.6.   Urinalysis does show positive for urine nitrates and urine WBC's were 11  to 20.   REVIEW OF SYSTEMS:  Is negative for dysuria.  Other review of systems  are positive as above.   PHYSICAL EXAM:  She is an age appropriate, morbidly obese white female  in minimal distress.  Her temperature is 99.6, blood pressure is 157/79.  Her heart rate is initially 85 and then 110, respiratory rate is 20.  Limited abdominal exam shows tenderness in the right upper quadrant  which is quite exquisite; however, minimal right lower quadrant  tenderness.  No other abdominal tenderness.   IMPRESSION:  Right upper quadrant tenderness, pain and nausea.  Normal  liver function test.   PLAN:  Ultrasound of the right upper quadrant to rule out  cholelithiasis.  I do not think the patient has appendicitis.  However,  without being able to CT scan the patient.  I really think the first  step should be ultrasound of the right upper quadrant.  Pending those  results, if that shows gallstones and a mildly thickened gallbladder  wall, we can certainly send her home on p.o. antibiotics and follow-up  with her for an elective cholecystectomy at some point.  We will await  those results.      Alfonse Ras, MD  Electronically Signed    KRE/MEDQ  D:  03/22/2007  T:  03/24/2007  Job:  [id number]

## 2010-12-06 NOTE — H&P (Signed)
NAME:  Karen Bennett, Karen Bennett               ACCOUNT NO.:  [id number]   MEDICAL RECORD NO.:  [id number]          PATIENT TYPE:  EMS   LOCATION:  ED                           FACILITY:  [facility]   PHYSICIAN:  Hind I Elsaid, MD      DATE OF BIRTH:  1975-12-30   DATE OF ADMISSION:  02/14/2008  DATE OF DISCHARGE:                              HISTORY & PHYSICAL   CHIEF COMPLAINT:  Nausea, vomiting, and abdominal pain for six days.   HISTORY OF PRESENT ILLNESS:  This is a 35 year old Caucasian female with  morbid obesity who presented to the emergency room with a chief  complaint of right flank abdominal pain that started suddenly on Monday.  The pain was 8/10.  It did radiate from the right flank to the right  lower abdomen.  Condition associated with nausea and vomiting.  The  patient admitted she vomits whatever she eats.  She denies any  hematemesis.  Vomiting is greenish to clear on color.  Patient denies  any fever.  Also, condition associated with constipation.  Last bowel  movement was on Monday.  Patient denies any surgical history to the  abdomen.  Patient denies any burning micturition.  At this time, patient  admitted pain mainly at the right lower abdomen.  The patient's pain is  sharp.  In the emergency room, plan of doing CT abdomen and pelvis for  evaluation of her cause of abdominal pain was unable to be done  secondary to overweight.  Patient was admitted for further evaluation of  her nausea, vomiting, and abdominal pain.   PAST MEDICAL HISTORY:  1. Morbid obesity.  2. History of MRSA.  3. Diabetes mellitus.  Patient noncompliant with her diabetes      medication.  She denies being on any diabetes.  4. History of anemia of chronic disease and dyslipidemia.   MEDICATIONS:  None.   ALLERGIES:  DOXYCYCLINE.   PAST SURGICAL HISTORY:  Incision and drainage of left thigh abscess.   SOCIAL HISTORY:  Patient works at the [employer].  Denies any  smoking.  Denies any alcohol  abuse.  Denies any IV drug abuse.  She is  single.  She lives with her mother.   FAMILY HISTORY:  Significant for diabetes.   REVIEW OF SYSTEMS:  Patient denies any headache.  Denies loss of  consciousness.  Denies blurring of vision or seizure activity.  Denies  any shortness of breath or chest pain.  Denies any weakness.  Denies any  joint pain.   LAB WORKUP:  White blood cells on the urine 11-20, RBCs 0-5, urine  glucose more than 1000, small leukocytes.  Lipase 15, sodium 130,  potassium 3.7, chloride 96, glucose 304, BUN 5, creatinine 0.9.  CBC:  White blood cells 7.6, hemoglobin 14.2, hematocrit 40.5, platelets 270.   As we mentioned, CT scan cannot be done secondary to overweight.   PHYSICAL EXAMINATION:  VITAL SIGNS:  Temperature 98.2, blood pressure  187/109, pulse rate 97, respiratory rate 20, saturating 98% on room air.  HEENT:  Patient lying comfortably in bed, not in  respiratory distress or  shortness of breath.  Morbidly obese lady.  Mucosa moist.  No  lymphadenopathy.  No JVD.  LUNGS:  Normal fascicular breathing with equal air entry.  HEART:  S1 and S2 without any murmur or gallop.  ABDOMEN:  Markedly obese.  Soft.  Some tenderness at the right lower  quadrant with tenderness at the costophrenic angle.  Bowel sounds  positive.  Could not appreciate any organomegaly.  EXTREMITIES:  Peripheral pulses intact.  There is no lower limb edema.  CNS:  Patient alert and oriented x3 without focal neurological findings.  RECTAL:  Empty rectal vault.   ASSESSMENT/PLAN:  1. This is a morbidly obese female who presented with nausea,      vomiting, and abdominal pain, possibility of pyelonephritis.      Cannot rule out acute abdominal process.  Patient was admitted to      the hospital, kept on a clear-liquid diet.  Will start the patient      on Cipro, Flagyl, and pain medications.  Supplement antiemetics.      Will get an x-ray of the abdomen, upright and flat.  Will get       lactic acid level and bicarb level.  Will begin the patient on      Cipro and Flagyl to cover for the possibility of pyelonephritis.      The patient needs to have a CT of the abdomen and pelvis.  Will      arrange that with an open CT scan.  If symptoms worsening, will      consult surgery.  2. Diabetes mellitus:  Patient diagnosed with diabetes mellitus since      2008.  She is not on any diabetic regimen.  The cause is unclear.      Will place the patient on a small dose of insulin, Lantus, and      sliding scale.  Oral hypoglycemic agent.  Will get hemoglobin A1C.  3. Hyperlipidemia:  Will get lipid profile and start the patient on a      statin.  4. Obesity:  Counseling.  Nutrition consult.  5. Hyponatremia, possibly secondary to hyperglycemia.  Will repeat.  6. Hypokalemia:  Would replace.  7. Deep venous thrombosis and gastrointestinal prophylaxis.      Hind Bosie Helper, MD  Electronically Signed     HIE/MEDQ  D:  02/15/2008  T:  02/15/2008  Job:  [id number]

## 2010-12-06 NOTE — H&P (Signed)
NAME:  Karen Bennett, Karen Bennett               ACCOUNT NO.:  [id number]   MEDICAL RECORD NO.:  [id number]          PATIENT TYPE:  INP   LOCATION:  0104                         FACILITY:  [facility]   PHYSICIAN:  Lucita Ferrara, MD         DATE OF BIRTH:  [birth year]   DATE OF ADMISSION:  04/29/2008  DATE OF DISCHARGE:                              HISTORY & PHYSICAL   PRIMARY CARE PHYSICIAN:  None.  The patient is a morbidly obese Caucasian female who presented to [facility] with  a chief complaint of shortness of breath  accompanied by pleuritis.  The patient's supposedly was diagnosed with  bronchitis last week, was given Bactrim and albuterol as an outpatient  and also nebulizer treatments and regardless of this outpatient  management, the patient still has shortness of breath with a progressive  cough.  The  patient denies recent travel, trauma to her lower  extremities.  Denies smoking, and denies hypercoagulable state or being  diagnosed with hypercoagulable syndrome or lupus.  She also denies  pregnancy.  Also denies for contraceptive pills.  She was evaluated in  the emergency room and was found to have pulmonary embolus which she  never had before.   REVIEW OF SYSTEMS:  Her review of systems otherwise negative.  She  denies fevers, chills, nausea, vomiting.  She denies any lower extremity  swelling.   PAST MEDICAL HISTORY:  1. History of pyelonephritis and urinary tract infection.  2. Diabetes which is uncontrolled with hemoglobin A1c of 11.  3. Morbidly obese.  4. History of MRSA.  5. History of left thigh abscess, status post incision and drainage,      February 2008.  6. History of tobacco abuse.  No longer smokes.   PAST SURGICAL HISTORY:  As above, abscess drainage.   SOCIAL HISTORY:  No longer smokes, no drugs or alcohol.   ALLERGIES:  Allergic to DOXYCYCLINE.   MEDICATIONS AT HOME:  None.  The patient states that she cannot afford  any of her medications.  She is  on not any prescription medications  right now.   PHYSICAL EXAMINATION:  GENERAL:  The patient is a morbidly obese female.  She is in no acute distress.  HEENT: She is normocephalic, atraumatic.  Sclerae nonicteric.  Extraocular muscles intact.  NECK:  Supple.  No JVD.  No carotid bruits.  CARDIOVASCULAR:  S1 and S2. Regular rate and rhythm.  No murmurs, rubs.  LUNGS:  Although due to her body habitus, it is very hard to hear the  lung sounds or the heart sounds.  ABDOMEN:  Morbidly obese.  Soft,  nontender.  I could hear some bowel sounds.  NEURO:  Patient is oriented x3.  Cranial nerves II-XII grossly intact.   LABORATORY DATA:  Her EKG shows normal sinus rhythm.  Normal ST-T waves  unchanged from May 19, 2008.  Her glucose was 266, D-dimer was  elevated to 0.4. Beta natriuretic peptide 552.  Her glucose was found to  be 330 here.  Hemoglobin 30.9, hematocrit 41.9 and her troponin is less  than 0.05.  Her chest x-ray shows mild changes of acute asthma and  bronchitis without localization of any type of pneumonia.  And her first  set of cardiac markers were essentially negative.  Her CT angiography  showed extensive bilateral pulmonary embolism, patchy densities in both  lungs.  Could be small infarctions or superimposed infection.  Critical  test results verified by emergency room physician.   ASSESSMENT:  A 35 year old, morbidly obese female with:  1. Shortness of breath and pleuritis with bilateral pulmonary embolism      diagnosed here in the emergency room.  No hemodynamic compromise or      instability.  2. Morbid obesity.  3. Diabetes type 2, uncontrolled.  4. Noncompliance with medical care including diabetes.  5. Increased beta natriuretic peptide with known congestive heart      failure and fluid overload.  The chest x-ray showing pulmonary      edema.   DISCUSSION AND PLAN:  1. Pulmonary embolism, likely secondary to immobility  given the      patient's body  habitus.  The patient states that she has not been      very mobile.  Question value of getting lower extremity Dopplers      secondary to body habitus.  The patient regardless is treated with      Lovenox and Coumadin per pharmacy protocol.  The patient needs to      establish with a primary care physician for compliance reasons and      to check coags.  Was initiated on Coumadin.  This needs to be      discussed with social services to see if we can set her up with      [facility].  I question the value getting hypercoagulable profile      in a patient that has obvious risk factors for DVT/PE.  Thus we      will go ahead and proceed.  Also question getting a 2-D      echocardiogram given her body habitus may not be very beneficial.      The patient has no hemodynamic compromise at this point. We may      proceed with getting a 2-D echocardiogram and/or ccm consult, if      there is hypotension, tachycardia or hypoxemia, or other signs of      hemodynamic compromise.  2. Diabetes.  Will initiate sliding scale insulin, hemoglobin A1c,      Lantus, and diabetic teaching.   Further plans really depend on her progress.      Lucita Ferrara, MD  Electronically Signed     RR/MEDQ  D:  04/29/2008  T:  04/30/2008  Job:  [id number]

## 2010-12-06 NOTE — Discharge Summary (Signed)
NAMEKERRYANN, Karen Bennett               ACCOUNT NO.:  [id number]   MEDICAL RECORD NO.:  [id number]          PATIENT TYPE:  INP   LOCATION:  1509                         [facility]ACILITY:  [name]   PHYSICIAN:  Lonia Blood, M.D.DATE OF BIRTH:  1976-03-21   DATE OF ADMISSION:  09/30/2008  DATE OF DISCHARGE:  10/05/2008                               DISCHARGE SUMMARY   PRIMARY CARE PHYSICIAN:  [facility].   DISCHARGE DIAGNOSES:  1. Left lower extremity presumed community-acquired methicillin-      resistant Staphylococcus aureus cellulitis.      a.     Lower extremity Doppler evaluation was negative for deep       vein thrombosis.      b.     Plain film x-rays of the left tibia and fibula without       evidence of underlying osteomyelitis.      c.     Improved with empiric IV vancomycin titrated to p.o. Septra.  2. Severely uncontrolled diabetes mellitus.  3. Morbid obesity.  4. Hypothyroidism.  5. Tobacco abuse.  6. Marijuana abuse.   DISCHARGE MEDICATIONS:  1. Glucotrol 10 mg p.o. b.i.d.  2. Glucophage 1000 mg p.o. b.i.d.  3. Synthroid 25 mcg p.o. daily.  4. Lantus insulin 45 units subcutaneous nightly.  5. Avandia 2 mg b.i.d.  6. NovoLog 4 units t.i.d. with meals.  7. Septra DS one b.i.d. for 7 days then stop.  8. Zofran 4 mg q.4h. p.r.n. nausea.  9. Vicodin 5/500 one to two every 6 hours p.r.n. pain.  10.Aspirin 325 mg a day over-the-counter.   FOLLOW UP:  The patient is scheduled with a follow up with Dr. Audria Nine  at [facility] on November 13, 2008, at 10:30 a.m.  At the time, the  patient's CBG log should be reassessed and hemoglobin A1c should be  reassessed.  The patient's left lower extremity should be assessed to  assure that her cellulitis has completely resolved.  The patient is  advised that should she develop recurrence of severe erythema of the  left lower extremity prior to this time that she should call [facility]  for an earlier appointment or present to the  emergency room.   PROCEDURE:  Left lower extremity Doppler evaluation - no evidence of  DVT.   HOSPITAL COURSE:  Ms. Lindie Roberson is a 35 year old female with  previously known diabetes who is morbidly obese.  She presented to the  hospital on the date of her admission with complaints of severe left  lower extremity pain, swelling and tenderness.  Clinical exam suggested  a significant cellulitis.  Plain film x-rays were carried out and  revealed no evidence of underlying bony osteomyelitis or periosteal  lifting.  Doppler evaluation was accomplished and revealed no evidence  of a DVT.  The patient was empirically placed on IV vancomycin therapy.  Her significant erythema improved steadily during the ongoing use of her  vancomycin therapy.  Blood cultures were obtained and were unrevealing.  At such time that the patient had improved sufficiently she was titrated  over to p.o. Septra.  She tolerated  this without difficulty.  With 24-  hour additional hours of oral therapy only the patient's erythema  continued to improve.  At the time of her discharge there is essentially  no erythema and only a moderate amount of persistent induration of the  previously noted severe erythema and band-like induration of the left  lower extremity.  The region effected was approximately 4 inches  superior to the ankle and primarily in the anterior tibial region.   During the hospital stay the patient was also noted to have severely  uncontrolled diabetes.  She admitted to noncompliance with medications  at home and reported this was a financial issue.  It was noted however,  that the patient could afford to continue to smoke cigarettes and was  smoking marijuana as well.  The patient was counseled as to the extreme  importance of ongoing use of her diabetes medicines and for strict  control of her blood sugar.  She was prescribed medications that are  mostly available on the $4.00 list from both [facility]  and [facility]  pharmacies.  CBG improved significant with titration of diabetes  medications.  The patient was advised that she should check her CBG on a  b.i.d. basis at home and record the values.  She was advised to be very  strictly compliant with a diabetic diet and her medication regimen.   During the hospital stay the patient was also found to have  TSH in  excess of 5.  Low-dose Synthroid was initiated.  A follow up TSH is  recommended in 6-8 weeks.      Lonia Blood, M.D.  Electronically Signed     JTM/MEDQ  D:  10/05/2008  T:  10/05/2008  Job:  [id number]   cc:   Melvern Banker  Fax: [phone]

## 2010-12-06 NOTE — Consult Note (Signed)
NAME:  Karen Bennett, Karen Bennett                ACCOUNT NO.:  [id number]   MEDICAL RECORD NO.:  [id number]          PATIENT TYPE:  INP   LOCATION:  1538                         FACILITY:  [facility]   PHYSICIAN:  Wilmon Arms. Corliss Skains, M.D. DATE OF BIRTH:  October 02, 1975   DATE OF CONSULTATION:  05/14/2007  DATE OF DISCHARGE:                                 CONSULTATION   CONSULTING PHYSICIAN:  Herbie Saxon, MD   REASON FOR CONSULTATION:  Cellulitis left thigh.   HISTORY:  The patient is a 35 year old female who is massively obese and  has had very limited previous medical care.  Last week on May 07, 2007, she presented to the emergency department for drainage of a  subcutaneous abscess on the left abdominal wall.  Last Thursday she  began noticing some swelling and pain in her left thigh.  This worsened  and she presented to the emergency department on Sunday evening.  She  was admitted on May 13, 2007, and was placed on Cubicin.  We are  asked to consult today regarding possible incision and drainage..  Of  note, the patient is too large to fit in the CT scanner or MRI.   PAST MEDICAL HISTORY:  New diagnosis of diabetes with a hemoglobin A1c  greater than 9, super-super morbid obesity with an estimated BMI over  77.   PAST SURGICAL HISTORY:  None.   ALLERGIES:  DOXYCYCLINE.   MEDICATIONS:  1. Lovenox for DVT prophylaxis.  2. Senokot.  3. Glucophage.  4. Glipizide, sliding scale insulin.  5. Cubicin.   SOCIAL HISTORY:  The patient smokes a half-a-pack a day x12 years.  Denies alcohol or drug use.   PHYSICAL EXAMINATION:  VITAL SIGNS:  T max 100.0, T current 98.3, pulse  92, blood pressure 132/89, saturations 100% on room air.  GENERAL:  This is a massively obese female in no apparent distress.  HEENT:  EOMI.  Sclerae anicteric.  NECK:  Obese.  Midline trachea.  No palpable masses.  LUNGS:  Distant breath sounds, clear bilaterally.  Normal respiratory  effort.  HEART:  Regular  rate and rhythm.  No murmur.  ABDOMEN:  Massively obese.  Healing noninfected wound left upper abdomen  with minimal drainage.  EXTREMITIES:  The patient's left thigh was edematous and erythematous  medially.  There were no obvious punctate openings.  There was no sign  of fluctuance.  This does not appear to traverse above the inguinal  crease.  In the emergency department they drew a line around the area of  cellulitis and the erythema seems to be decreased.   LABS:  White count today 17.4 which is down from 18.1, hemoglobin 12.9,  platelet count 248.  Sodium 128, potassium 3.7, blood sugar 233, BUN 3,  creatinine 0.57, hemoglobin A1c is 9.3.  The patient had an ultrasound  of the left thigh performed yesterday which showed some edema in the  muscle bundles of the left medial thigh but no discrete abscess or mass.  No drainable fluid collection.   IMPRESSION:  1. Cellulitis of the left thigh with no  clear signs of abscess and no      indications for drainage.  2. Super-super morbid obesity with BMI greater than 77.  3. Poorly controlled diabetes.   RECOMMENDATIONS:  1. No surgical indications at this time.  Continue antibiotics.      Optimize blood sugar control and correct electrolytes.  2. Will follow along with you to see if there are any indications for      surgery.  If she does have incision and drainage there will be some      considerable wound care issues due to her poorly controlled      diabetes and her obesity.      Wilmon Arms. Tsuei, M.D.  Electronically Signed     MKT/MEDQ  D:  05/14/2007  T:  05/15/2007  Job:  [id number]   cc:   Herbie Saxon, MD

## 2010-12-06 NOTE — H&P (Signed)
NAME:  Karen Bennett, Karen Bennett               ACCOUNT NO.:  [id number]   MEDICAL RECORD NO.:  [id number]          PATIENT TYPE:  INP   LOCATION:  0113                         FACILITY:  [facility]   PHYSICIAN:  Pedro Earls, MD     DATE OF BIRTH:  April 28, 1976   DATE OF ADMISSION:  09/30/2008  DATE OF DISCHARGE:                              HISTORY & PHYSICAL   ADMITTING TEAM:  Team [facility], [facility].   CHIEF COMPLAINT:  Left lower extremity pain, swelling, and tenderness.   HISTORY OF PRESENT ILLNESS:  This is a 35 year old white female patient  with a past medical history significant for diabetes type 2 and morbid  obesity who had not been taking her medication for diabetes.  Was  admitted with a chief complaint of left lower extremity edema and pain.  According to the patient, many years ago she had a hematoma in the left  lower extremity which was drained.  Subsequently it got infected, and  patient had a catheter put in as well.  It had healed until a week ago  when patient started noticing some swelling in the left lower extremity  at the site of the previous surgery.  The swelling kept getting worse,  to the extent that today, patient's whole left lower extremity is  swollen with warmth, tenderness, and the redness.  Patient was unable to  ambulate due to the severe pain.  He was also feeling nauseous.  Patient  also feeling thirsty and having to urinate multiple times a day.  Patient has no appetite as well.   REVIEW OF SYSTEMS:  As above.  The rest of the review of systems were  negative.   PAST MEDICAL HISTORY:  1. Morbid obesity.  2. Diabetes, which is uncontrolled.  3. Smoking addiction.   PAST SURGICAL HISTORY:  Patient had a wound VAC placed in the left upper  extremity for the wound infection and also incision and drainage at the  infected hematoma of the left lower extremity.   ALLERGIES:  DOXYCYCLINE, makes her sick to her stomach.   SOCIAL HISTORY:  Smokes 5 to 6 cigarettes  per day for 15 years.  No  alcohol.  No IV drug abuse.   FAMILY HISTORY:  Positive for diabetes and hypotension.   MEDICATIONS:  None.   PHYSICAL EXAMINATION:  VITALS:  Temperature 98, blood pressure 142/74,  pulse 96, respirations 16, pulse ox 96% on room air.  Patient is awake, alert and oriented x3.  Does appear to be in some  distress secondary to the pain in her left lower extremity.  HEENT:  Pupils are equal, round and reactive to light.  No icterus.  No  pallor.  Extraocular muscles are intact.  Oral mucosa is dry.  NECK:  Supple.  No JVD.  No lymphadenopathy.  CVS:  S1 and S2.  Regular.  No murmurs, heaves, or gallops.  CHEST:  Clear.  ABDOMEN: Soft, obese.  Nontender.  Bowel sounds are present.  No  hepatosplenomegaly.  EXTREMITIES:  Peripheral pulses are present.  No clubbing or cyanosis.  Patient has a left lower  extremity edema, warm with tenderness and  erythema.  CNS;  Motor and sensory grossly intact.  Cranial nerves II-XII are  intact.  SKIN:  As above.  MUSCULOSKELETAL:  Unremarkable.   LABS:  Patient's white count is 10.8 with a left shift.  Platelet count  is 235.  Sodium is 127.   X-ray of the left tibial and fibula did not reveal any bony abnormality.  Patient had advanced tricompartmental degenerative changes in the knee.   Blood cultures are pending.   Chest x-ray is pending.   IMPRESSION:  1. Left lower extremity cellulitis.  2. Uncontrolled diabetes type 2.  3. Morbid obesity.  4. Hypotension secondary to dehydration as a result of hyperglycemia.  5. Smoking and nicotine addiction.   PLAN:  Admit the patient to med-surg.  IV Ancef.  Blood cultures.  Insulin sliding scale with basal Lantus.  Continue IV fluids with normal  saline at 125 an hour.  Pain management.  Repeat CBC and BMP.  An 1800-  calorie ADA diet.      Pedro Earls, MD  Electronically Signed     NS/MEDQ  D:  09/30/2008  T:  09/30/2008  Job:  [id number]

## 2010-12-06 NOTE — Op Note (Signed)
NAME:  KSENIA, KUNZ                ACCOUNT NO.:  [id number]   MEDICAL RECORD NO.:  [id number]          PATIENT TYPE:  INP   LOCATION:  1538                         FACILITY:  [facility]   PHYSICIAN:  Wilmon Arms. Corliss Skains, M.D. DATE OF BIRTH:  04/11/1976   DATE OF PROCEDURE:  05/20/2007  DATE OF DISCHARGE:                               OPERATIVE REPORT   PREOPERATIVE DIAGNOSES:  1. Left thigh abscess.  2. Super, super morbid obesity.   POSTOPERATIVE DIAGNOSES:  1. Left thigh abscess.  2. Super, super morbid obesity.   PROCEDURE PERFORMED:  Incision and drainage of left thigh abscess.   SURGEON:  Wilmon Arms. Corliss Skains, M.D., FACS   ANESTHESIA:  General.   INDICATIONS:  The patient is a 35 year old female who is massively obese  with a body mass index greater than 75.  She presented a week ago with  uncontrolled diabetes and left thigh cellulitis.  The patient was too  large to fit inside a CT scan or MRI.  She has been followed with  ultrasounds which never showed a fluid collection.  The patient has had  minimal improvement on IV antibiotics.  Today's ultrasound showed a  fluid collection.  The decision was made to come operating room for  incision and drainage.   DESCRIPTION OF PROCEDURE:  The patient is brought to the operating room,  placed in supine position on her bed.  She was too large to move over to  the operating room table.  Her left medial thigh was exposed.  After an  adequate level of general anesthesia was obtained, the patient's left  thigh was prepped with Betadine and draped in sterile fashion.  A  longitudinal incision was made over the most indurated area.  Dissection  was carried down through about 3 inches of subcutaneous fat.  Blunt  dissection was used to dissect into the very large abscess cavity.  Approximately 4 mL of foul-smelling purulent drainage was suctioned out.  The wound extended both medially and laterally but was all superficial  to the muscle.  Then a  circle of skin was excised to provide better  exposure to the abscess cavity.  The abscess cavity was thoroughly  irrigated with pulse lavage with 3 liters normal saline.  The wound was  then packed with two Kerlix gauze rolls.  An ABD dressing was applied.  The patient was extubated and brought to recovery in stable condition.  All sponge, instrument, needle counts correct.      Wilmon Arms. Tsuei, M.D.  Electronically Signed     MKT/MEDQ  D:  05/20/2007  T:  05/21/2007  Job:  [id number]

## 2010-12-06 NOTE — Consult Note (Signed)
NAME:  Karen Bennett, Karen Bennett               ACCOUNT NO.:  [id number]   MEDICAL RECORD NO.:  [id number]          PATIENT TYPE:  INP   LOCATION:  1318                         FACILITY:  [facility]   PHYSICIAN:  Nadara Mustard, MD     DATE OF BIRTH:  04-23-1976   DATE OF CONSULTATION:  03/12/2009  DATE OF DISCHARGE:                                 CONSULTATION   HISTORY OF PRESENT ILLNESS:  The patient is a 35 year old woman who  states that she had an ulcer develop approximately 2 months ago.  She  states that this did not seem bad until recently when she started having  more drainage, redness and warmth in the left foot.  She was seen in the  emergency room today for evaluation and treatment of the worsening left  foot ulcer.  Vital signs:  Blood pressure 183/126, pulse 103,  respiratory rate 18, temperature 98.4.   PAST MEDICAL HISTORY:  1. Significant for hypertension.  2. Diabetes.  3. Bronchitis.  4. BMI of 69.  5. History of MRSA.   FAMILY HISTORY:  Noncontributory.   SOCIAL HISTORY:  She lives at home.  Does smoke.   ALLERGIES:  DOXYCYCLINE.   LABORATORY DATA:  UA is positive for a protein of 109 mg/dL,  urobilinogen 0.2, nitrates negative, leukocyte esterase small, urine  glucose 250.  BUN of 12, creatinine 0.5 with glucose of 233, hemoglobin  14.3 with a white blood cell count of 11.5.   DIAGNOSTICS:  Radiographs show soft tissue ulceration of the left foot,  but no evidence of any chronic osteomyelitis.   PHYSICAL EXAMINATION:  The patient has a good dorsalis pedis pulse.  She  has cellulitis involving the lateral aspect of the left half of the left  foot.  She has gangrene of the left fifth toe.  There is a Wagner grade  3 ulcer which probes to bone over the lateral aspect of the fifth  metatarsal.  The patient does not have protective sensation.   ASSESSMENT:  1. Diabetic insensate neuropathy.  2. Cellulitis with Wagner grade 3 ulceration left foot.  3. Gangrene of the  fifth toe.   PLAN:  The patient is admitted.  She will start her antibiotics which  include vancomycin and Zosyn.  We will start her on wound care with Dial  soap soaks and Silvadene dressing changes twice a day.  She will obtain  ankle-brachial indices as well as MRI scan.  I discussed with the  patient and her family that we will allow her 48 hours of IV  antibiotics and wound care to see how much the foot will resolve with  conservative measures.  Discussed that she most likely will require some  type of surgical intervention and we will evaluate on Monday.  Hopefully  at best, the patient could get by with amputation of the fifth  metatarsal and fifth toe.      Nadara Mustard, MD  Electronically Signed     MVD/MEDQ  D:  03/12/2009  T:  03/12/2009  Job:  [id number]

## 2010-12-06 NOTE — Discharge Summary (Signed)
NAME:  Karen Bennett, Karen Bennett                ACCOUNT NO.:  [id number]   MEDICAL RECORD NO.:  [id number]          PATIENT TYPE:  INP   LOCATION:  1538                         FACILITY:  [facility]   PHYSICIAN:  Herbie Saxon, MDDATE OF BIRTH:  [birth year]   DATE OF ADMISSION:  05/12/2007  DATE OF DISCHARGE:                               DISCHARGE SUMMARY   CONSULTATIONS:  1. Surgery, Dr. Manus Rudd.  2. Infectious disease, Dr. Johny Sax.  3. Radiology.  Left thigh ultrasound of May 13, 2007 showed edema      separating the muscle bundles of the left medial thigh.  There is      no focal abscess or drainable fluid collection.  A repeat      ultrasound on May 16, 2007 also showed left thigh cellulitis      with no visible abscess.  Chest x-ray on May 16, 2007:  Lungs      are clear.   HOSPITAL COURSE:  This 35 year old Caucasian lady who is morbidly obese  presented to the emergency room complaining of swelling of the left  thigh.  She had earlier visited the emergency room and was treated with  incision and draining and sent out with Bactrim.   PAST MEDICAL HISTORY:  1. She also has a past medical history of MRSA infection  2. Peripheral edema.   On this admission the patient was initially started on Cubicin.  Zosyn  was eventually added.  She was noticed to be having whitish, itchy  vaginal discharge and she was ordered the addition of Diflucan and p.o.  Flagyl.  Glycemic control has been improved by increasing her Lantus  insulin dose and glipizide dose.  She was also started on Niaspan  because of hypertriglyceridemia.  She has been educated on the need for  improved dietary compliance.  Hyponatremia has been improved without  fluid hydration and fever has been controlled with Tylenol p.r.n.  The  culture of the thigh wound has grown MRSA.  This was from the previous  incision and drainage.  Infectious disease was consulted and he switched  the antibiotic regimen  to narrow it down to IV vancomycin and Diflucan  and the patient has remained afebrile; however, the left thigh is still  swollen and tender with erythema and the possibility of repeating the  ultrasound to rule out any underlying abscess collection.  Condition at  present is stable.  Diet to be 1800 calorie ADA, low fat, low  cholesterol.  Activity will be increased as tolerated once the  inflammation of the left thigh improves.  Her current medication will be  dictated on final discharge .   PHYSICAL EXAMINATION:  Temperature 98, pulse 65, respiratory rate 20,  blood pressure is 120/60.  HEENT:  Pupils equal, reactive to light and accommodation.  Extraocular  muscles are intact.  Head is atraumatic, normocephalic.  Mucosa is  moist.  She is morbidly obese.  __________  EXTREMITIES:  No cyanosis, no clubbing.  NECK:  Supple.  There is no swelling of the bilateral lymph nodes.  CHEST:  Clinically clear.  HEART:  Heart sounds 1 and 2 muffled.  ABDOMEN:  She has gross truncal obesity.  No organomegaly palpated.  Left thigh is erythematous and tender.  Shotty inguinal lymph nodes on  the left groin.  Peripheral pulses reduced.  Trace pedal edema.   I attempted to get a Doppler of the left leg to rule out any underlying  deep venous thrombosis.   I will follow  infectious disease and surgical recommendations.  Continue present antibiotics.      Herbie Saxon, MD  Electronically Signed     MIO/MEDQ  D:  05/19/2007  T:  05/19/2007  Job:  [id number]

## 2010-12-06 NOTE — H&P (Signed)
NAME:  Karen Bennett, Karen Bennett                ACCOUNT NO.:  [id number]   MEDICAL RECORD NO.:  [id number]          PATIENT TYPE:  INP   LOCATION:  1538                         FACILITY:  [facility]   PHYSICIAN:  Altha Harm, MDDATE OF BIRTH:  10-15-1975   DATE OF ADMISSION:  05/12/2007  DATE OF DISCHARGE:                              HISTORY & PHYSICAL   CHIEF COMPLAINT:  Swelling of the left thigh.   HISTORY OF PRESENT ILLNESS:  This is a morbidly obese 35 year old female  who presented to the emergency room with complaints of swelling of her  left inner thigh.  Patient states that she was seen in the emergency  room on Tuesday night where she was treated for an abscess with incision  and drainage and sent out with Bactrim.  She states that today she  noticed that she felt as though something popped within the skin of her  thigh and then noticed the redness and swellness.  Patient denies any  fever or chills, she denies any diarrhea.  She had 1 episode of emesis  and some nausea.  Patient denies any recollection of trauma, skin tears  or punctures.  While in the emergency room the ER physician, attempted  an aspiration in the area but did not get anything on drawback.   PAST MEDICAL HISTORY:  The patient has no chronic illnesses.   FAMILY HISTORY:  Significant for diabetes.   SOCIAL HISTORY:  1. The patient smokes about 1/2 pack per day for approximately 12      years.  2. She denies any alcohol or drug use.   CURRENT MEDICATIONS:  Bactrim and Motrin.   ALLERGIES:  DOXYCYCLINE.   PRIMARY MEDICAL PHYSICIAN:  Patient is unassigned without a primary  medical physician.   REVIEW OF SYSTEMS:  Patient denies any weight loss or weight gain, heat  or cold intolerance, any easy bruising, any diarrhea or constipation.  She denies any dizziness, loss of consciousness, headache, blurring of  vision or seizure activity.  She denies any difficulty eating,  swallowing.  She denies any mouth  pain.  She denies any shortness of  breath, cough, dyspnea on exertion.  She denies any chest pain or  palpitations.  Patient denies any abdominal pain.  She denies any  dysuria, frequency or urgency.  She denies any weakness.  Patient states  she does have joint pain upon standing and ambulating.   Laboratory studies in Emergency reveal the following:  White blood cell  count 18.1, hemoglobin 14.3, hematocrit 41, platelet count 264,000.  Sodium 127, potassium 3.6, chloride 94, bicarbonate 25, BUN 4,  creatinine 0.57.   PHYSICAL EXAMINATION:  Patient is morbidly obese, laying in bed.  Temperature 100.1 orally, blood pressure 148/83, heart rate 105,  respiratory rate 16, sats 96% on room air.  HEENT EXAMINATION:  She is normocephalic, atraumatic.  Pupils are  equally round and reactive to light and accommodation, extraocular  movements are intact.  Oropharynx is moist.  Patient has piercing in her  tongue.  There is no exudate, erythema or lesions noted.  NECK EXAMINATION:  Neck  is obese.  Trachea is midline.  I am unable to  palpate any masses or any thyromegaly.  I am unable to appreciate any  JVD and there is no carotid bruit.  RESPIRATORY EXAMINATION:  Patient has normal respiratory effort.  She  has got no wheezing, rales or rhonchi noted.  CARDIOVASCULAR:  She has got a mild tachycardia, normal S1 and S2, no  murmurs, rubs or gallops noted.  ABDOMEN:  Markedly obese.  The patient has a small area where she had  the I&D performed on the left side of her abdomen.  I am unable to  appreciate any masses or hepatosplenomegaly.  However, please note the  exam is almost prohibitive by the patient's obesity.  LYMPHADENOPATHY:  I am unable to appreciate any cervical or axillary  lymphadenopathy.  Patient refuses palpation in the inguinal area  secondary to pain.  NEUROLOGICAL:  Cranial nerves II through XII appear to be grossly  intact.  There is no focal neurological deficits; however,  I am unable  to carry out a formal strength testing on the patient.  Sensation is  intact to light touch and proprioception and the patient has 2+ DTRs in  bilateral upper and lower extremities.  PSYCHIATRIC:  She is alert and oriented x3 with good insight and  cognition, good recent and remote recall.   ASSESSMENT AND PLAN:  This is a morbidly obese female who presents with  a cellulitis of the left thigh area.  I am suspicious for methicillin-  resistant Staphylococcus aureus in this patient and I will start her on  vancomycin.  Blood cultures have been sent and will await culture and  sensitivity.  In light of the patient's obesity and the limitation on  the examination, I will get a CT scan of her leg and hip to further  evaluate for any abscess or collection of fluid in the area.      Altha Harm, MD  Electronically Signed     MAM/MEDQ  D:  05/13/2007  T:  05/13/2007  Job:  [id number]

## 2010-12-06 NOTE — Discharge Summary (Signed)
NAME:  Karen Bennett, Karen Bennett               ACCOUNT NO.:  [id number]   MEDICAL RECORD NO.:  [id number]          PATIENT TYPE:  INP   LOCATION:  1418                         FACILITY:  [facility]   PHYSICIAN:  Lonia Blood, M.D.      DATE OF BIRTH:  27-Jun-1976   DATE OF ADMISSION:  04/29/2008  DATE OF DISCHARGE:                               DISCHARGE SUMMARY   PRIMARY CARE PHYSICIAN:  She is unassigned to [place].   DISCHARGE DIAGNOSES:  1. Extensive bilateral pulmonary emboli.  2. Diabetes.  3. Morbid obesity.  4. Hyponatremia transiently.  5. Transient hypokalemia.  6. History of methicillin-resistant Staphylococcus aureus infection,      currently stable.   DISCHARGE MEDICATIONS:  1. Coumadin currently at 12.5 mg daily, to be adjusted as an      outpatient.  2. Lantus insulin 35 units q.h.s. with sliding scale insulin during      the day.  3. Lopressor 25 mg p.o. b.i.d.   DISPOSITION:  The patient will be discharged home.  We will set her up  with [facility] to possibly get her medications from there and follow  up her PT/INR on a regular basis.   PROCEDURES PERFORMED THIS ADMISSION:  Chest x-ray on April 29, 2008,  showed mild changes of acute asthma and bronchitis without localized  airspace pneumonia.  CT chest with contrast on April 29, 2008, showed  extensive bilateral pulmonary emboli.  Patchy densities in both lungs  could be small infarctions of superimposed infection.   CONSULTATIONS:  None.   BRIEF HISTORY AND PHYSICAL:  Please refer to dictated history and  physical on admission.  In short, however, a 35 year old female that  presented with chief complaint of shortness of breath and pleuritis.  She was diagnosed with bronchitis earlier and treated with some  nebulizers.  The patient was seen in the ER and evaluated, and her CT  scan showed excessive bilateral pulmonary emboli, as indicated above.  Hence, she was admitted for further management.   HOSPITAL COURSE:  1.  Bilateral pulmonary emboli.  The patient was admitted and started      on Lovenox and Coumadin concurrently.  Her INR was followed daily      while the dosing of her Coumadin was adjusted.  She finally had an      INR of 2.0 on May 04, 2008, and today it is 2.6.  Based on the      patient's response, her current dose should be between 10 and 12.5      for now, to be adjusted appropriately as an outpatient.  She has      remained stable.  Occasional chest pain.  Otherwise, her shortness      of breath has virtually disappeared.  2. Diabetes.  The patient's hemoglobin A1c was more than 11,      indicating poor control as an outpatient.  She could not afford her      medications apparently, so she was unable to take her medicine.  We      have put her back on insulin, and her  sugars have been steadily      controlled.  She will need social work input and help, probably      referral to [facility] so she could get her medications through      this program.  3. Morbid obesity.  The patient was counseled as necessary.  4. Transient hypokalemia.  Potassium was low briefly and sodium was      also low.  Sodium was 113, potassium 3.4.  Both were corrected      appropriately with both fluids and potassium supplementation.      Lonia Blood, M.D.  Electronically Signed     LG/MEDQ  D:  05/05/2008  T:  05/05/2008  Job:  [id number]

## 2010-12-09 NOTE — Discharge Summary (Signed)
NAMEABEL, RA               ACCOUNT NO.:  [id number]   MEDICAL RECORD NO.:  [id number]          PATIENT TYPE:  INP   LOCATION:  1318                         FACILITY:  [facility]   PHYSICIAN:  Elliot Cousin, M.D.    DATE OF BIRTH:  1975-12-27   DATE OF ADMISSION:  03/12/2009  DATE OF DISCHARGE:  03/18/2009                               DISCHARGE SUMMARY   ADDENDUM:  Please see the previous discharge summary dictated by Dr.  Jetty Duhamel.   This is an amendment to the discharge medications dictated previously.  Zocor was discontinued secondary to cost.  The patient was therefore  discharged home on pravastatin 20 mg q.h.s. primarily because this  medication can be acquired at the [facility] pharmacy for $4.00 per  prescription.   AMENDED DISCHARGE MEDICATIONS:  1. Glucotrol 10 mg b.i.d.  2. Glucophage 500 mg b.i.d.  3. Pravastatin 20 mg q.h.s.  4. Clindamycin 300 mg every 8 hours.  5. Oxycodone 5 mg 1-2 tablets every 4 hours as needed for severe pain.  6. Tylenol 650 mg every 4 hours as needed for pain.      Elliot Cousin, M.D.  Electronically Signed     DF/MEDQ  D:  03/20/2009  T:  03/20/2009  Job:  [id number]

## 2010-12-09 NOTE — H&P (Signed)
[facility]  Patient:    Karen Bennett, Karen Bennett                      MRN: [id number] Adm. Date:  [id number] Attending:  Charlton Haws                         History and Physical  CHIEF COMPLAINT:  Abscess.  CLINICAL HISTORY:  Ms. Depriest is a 35 year old woman who has had gradually increasing pain and swelling in the right perineal area at about the level of the bottom of the vulva on the right side.  This has gotten progressive more painful, swollen, red.  She thought there was some infection present and has tried to "draw the infection out" by using Epsom salts, drawing salts, etc., to no avail.  She presented to the emergency room this morning for evaluation. She is seen by Dr. _________  and found to have what appeared to be a large indurated area with an abscess and some drainage.  Surgical consultation was requested.  The patient has had no other rectal symptoms nor any vaginal symptoms other than this abscess.  PAST MEDICAL HISTORY:  OPERATIONS:  None.  MEDICATIONS:  None.  ALLERGIES:  None known.  REVIEW OF SYSTEMS:  HEENT:  Negative.  CHEST:  No cough, shortness of breath. HEART:  No history of murmurs, hypertension, other cardiac problems.  ABDOMEN: Negative.  GENITOURINARY:  Negative.  EXTREMITIES:  Negative.  PHYSICAL EXAMINATION:  GENERAL:  The patient is a morbidly obese female who was alert, awake, and in no distress.  VITAL SIGNS:  She has a temperature of 100.2 in the emergency room with a slight tachycardia of a rate of about 130.  HEENT:  Head is normocephalic.  Eyes nonicteric.  Pupils round and regular.  NECK:  Supple.  LUNGS:  Clear to auscultation.  HEART:  Regular.  No murmurs, rubs, or gallops.  ABDOMEN:  Benign.  No masses or tenderness.  Perineal area shows a marked induration to the right of the buttock and vulva with an open area that is about on a level with the inferior edge of the vulva, but lateral  to it, but not quite out to the buttock leg crease.  The induration centered around this area.  It is woody, hard, _________ .  EXTREMITIES:  No cyanosis or edema.  LABORATORY STUDIES:  Show normal urinalysis, although some nitrates were positive.  Electrolytes were okay, although her potassium was borderline low at 3.6.  White count was 17,000 with a hemoglobin of 14.  IMPRESSION:  Perineal abscess.  PLAN:  This abscess I do not believe can be drained in the emergency room under local, so will plan to take her to the operating room for drainage under anesthesia.  I have discussed that with the patient.  I have spoken with anesthesia and we will attempt to do this with a MAC anesthesia. DD:  03/10/01 TD:  03/10/01 Job: [id number]

## 2010-12-09 NOTE — Op Note (Signed)
[facility]  Patient:    Karen Bennett, Karen Bennett                      MRN: [id number] Proc. Date: 03/10/01 Adm. Date:  [id number] Attending:  Charlton Haws                           Operative Report  ACCOUNT:  [id number]  PREOPERATIVE DIAGNOSIS:  Perineal abscess.  POSTOPERATIVE DIAGNOSIS:  Perineal abscess.  OPERATION:  Drainage of perineal abscess.  SURGEON:  Currie Paris, M.D.  ANESTHESIA:  MAC.  CLINICAL HISTORY:  This patient is a 35 year old, who has had a one week history of a developing abscess to the right of the vulva.  It was not clear of the origin, but this was thought to be not drainable under local in the emergency room.  DESCRIPTION OF PROCEDURE:  The patient was brought to the operating room and given some IV sedation.  She was placed in lithotomy position.  The area that was draining was even with the posterior end of the vulva, lateral to it, but not quite out to the buttock crease.  The whole area was indurated, and I really could not tell which way the fluctuance went from there.  I put local in, which was 1% Xylocaine, and made the incision, and then probed the incision with a Kelly clamp.  I apparently got some pus out, and there seemed to be a deep cavity.  It seemed initially to track medially towards the vulva, so I made about a 1.5 cm incision horizontally from the drainage site.  I was unable to examine the abscess cavity digitally and found that it was about 2 cm deep but tracked anteriorly parallel to the vulva and labia.  I therefore extended the incision in that direction after infiltrating more local.  The abscess cavity was about the size of a golf ball, and there was some necrotic tissue in it.  The was all copiously irrigated out.  I did obtain cultures. There was a fair amount of bleeding from the thick, edematous skin and subcutaneous tissues, and this was controlled with cautery.  Once  everything appeared to be completely dry, I went ahead and packed it with Kerlix. Digital rectal exam showed no evidence of any perirectal induration, and I think must have started much more anteriorly and was not really a perirectal abscess, although the etiology was still unclear, as the vulva area also looked fairly normal.  The patient was awakened to be taken back to the recovery room in satisfactory condition.  All counts were correct. DD:  03/10/01 TD:  03/10/01 Job: [id number]

## 2010-12-09 NOTE — Consult Note (Signed)
NAME:  Karen Bennett, Karen Bennett                ACCOUNT NO.:  [id number]   MEDICAL RECORD NO.:  [id number]          PATIENT TYPE:  INP   LOCATION:  0101                         FACILITY:  [facility]   PHYSICIAN:  Currie Paris, M.D.DATE OF BIRTH:  04-09-1976   DATE OF CONSULTATION:  07/24/2004  DATE OF DISCHARGE:                                   CONSULTATION   CHIEF COMPLAINT:  Swelling, left lower extremity.   CLINICAL HISTORY:  Karen Bennett is a 35 year old woman with several medical  issues who presented with chronic left lower extremity swelling and  tenderness.  She states that she had an injury to that about a year and a  half ago and was told she had a hematoma and it has been there ever since.  She has always been concerned about it, nothing's been done, but now she  has had increasing pain and tenderness in this area and it has become more  swollen.   PAST MEDICAL HISTORY:  As noted in her old chart and significant for  diabetes, hypertension, dyslipidemia, tobacco abuse.  She is also noted to  have morbid obesity.  In addition, she recently in November had cellulitis  of the left lower extremity and had I&D by Dr. Orson Slick, which has healed over  completely.  The remainder of her past history normal.   MEDICATIONS:  None.   ALLERGIES:  NONE KNOWN.   SOCIAL HISTORY:  She does not work.  Lives with her mother.  Does not use  alcohol, but does smoke about 1-1/2 packs per day.   REVIEW OF SYSTEMS:  HEENT:  Negative.  CHEST:  No cough or shortness of  breath.  HEART:  Negative.  ABDOMEN:  Negative.  GENITOURINARY:  Negative.  EXTREMITIES:  Notable for recent right leg infection.   PHYSICAL EXAMINATION:  GENERAL:  The patient is alert and oriented,  uncomfortable appearing, morbidly obese.  VITAL SIGNS:  Blood pressure 146/82, pulse 91, respirations 20, temperature  97.0, weight 492 pounds.  HEENT:  Head normocephalic.  Eyes anicteric.  NECK:  Supple.  ABDOMEN:  Benign.  EXTREMITIES:  She is morbidly obese, but just above the ankle on the left  side is a marked area of about 20 cm of fluctuance that is exquisitely  tender and has some overlying erythema.  This is consistent with a  developing abscess.   LABORATORY DATA:  White count 19,000, hemoglobin 15.  Normal electrolytes  and renal function.   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   PLAN:  She is being admitted by the hospitalist service for management of  her medical issues.  I think we need to take her to the operating room for  drainage of this area and cultures.  I discussed this with the patient and  she agreeable.  We will try this with a MAC anesthesia.     Chri   CJS/MEDQ  D:  07/24/2004  T:  07/24/2004  Job:  [id number]

## 2010-12-09 NOTE — Discharge Summary (Signed)
Karen Bennett, Karen Bennett                ACCOUNT NO.:  [id number]   MEDICAL RECORD NO.:  [id number]          PATIENT TYPE:  INP   LOCATION:  0464                         FACILITY:  [facility]   PHYSICIAN:  Marcie Mowers, M.D.DATE OF BIRTH:  04-02-1976   DATE OF ADMISSION:  07/24/2004  DATE OF DISCHARGE:  07/30/2004                                 DISCHARGE SUMMARY   DISCHARGE DIAGNOSES:  1.  Left lower extremity synovitis with methicillin-resistant Staphylococcus      aureus abscess.  2.  Diabetes, type 2.  3.  Dyslipidemia.  4.  Hypertension.  5.  Morbid obesity.   DISCHARGE MEDICATIONS:  1.  Vancomycin 2500 mg IV q.24h. until August 06, 2004.  2.  Doxycycline 100 mg b.i.d. to be started on August 07, 2004 and      continued through August 13, 2004.  3.  Glucophage 500 mg t.i.d.  4.  Zocor 20 mg p.o. daily.  5.  Maxzide p.o. daily.  6.  Aspirin 1 pill daily.   DISPOSITION:  The patient was discharged to home with home health services  to help with administration of vancomycin, to check her serum creatinine and  CBC on August 05, 2004.  The results to be reported to Dr. Barbee Shropshire.  Home  health RN also to help with left lower extremity wound care and J-P  management.   PROCEDURES PERFORMED:  Incision and drainage of left lower extremity  abscess.   CONSULTATIONS:  1.  Dr. Currie Paris.  2.  Infectious disease, Dr. Maurice March.   ADMITTING HISTORY AND PHYSICAL:  In brief, this is a 35 year old Caucasian  female with a past medical history of diabetes, type 2, hypertension,  dyslipidemia, history of tobacco use, presented to emergency department due  to left lower extremity swelling and pain of 1 day duration.  She did not  have any fever or chills.  She did have history of trauma to her left lower  extremity 1-1/2 years ago and stated that her swelling had never gone down.  She was told that she had a hematoma.  No history of any surgeries or recent  trauma.   MEDICATIONS ON ADMISSION:  1.  Zocor 20 mg daily.  2.  Ambien.  3.  Maxzide.  4.  Glucophage 250 mg daily.  5.  Aspirin 81 mg daily.  6.  Percocet p.r.n.   For detailed history and physical, please refer to the history and physical  note by Dr. __________ on July 24, 2004.   LABORATORY DATA ON ADMISSION:  White count of 19.5 with a differential of  87% neutrophils, lymphocytes 7%, monocytes 4%, eosinophils 1%, basophils 1%.  Hemoglobin 15.3, hematocrit 44, platelet count 273,000.  The CBC also showed  increased bands of more than 20%.  A BNP revealed sodium of 136, potassium  4.1, chloride 105, CO2 23, glucose 150, BUN 8, creatinine of 0.6.  Her total  bilirubin was 1.5, alkaline phosphatase 61, AST 26, ALT 48, total protein  6.8, albumin 3.9, calcium 9.4.  Pro time was 12.6, INR 0.9, PTT 23.  Blood  cultures drawn upon admission were negative.  Wound culture done on  admission was reported on July 26, 2004, with methicillin-resistant  Staphylococcus aureus, which was sensitive to vancomycin.   IMAGING STUDIES:  1.  Left tibia and fibula plain x-ray showed soft tissue swelling of distal      anterior calf and diffuse subcutaneous edema.  No fracture or abnormal      calcification was seen.  2.  Portable chest x-ray done, per physician, of PICC line.  It showed      catheter to be in the right IJ.  The catheter was then repositioned, and      a repeat portable chest x-ray was done which showed the PICC line tip to      be in right atrium 2 cm inferior to right atrial SVC junction.  3.  CT scan with contrast of the left lower extremity.  It showed no      significant residual abscess in left lower leg after the surgical      drainage procedure.  No fluid collection was seen around the drain.      Extensive cellulitis involving the lower calf extending into the      hindfoot was seen.   HOSPITAL COURSE:  #1 - LEFT LOWER EXTREMITY ABSCESS:  The patient was  admitted to medical  floor.  She was started on vancomycin and Unasyn as  empiric coverage, started on analgesics.  Surgery was consulted due to  suspicion of abscess.  She underwent the procedure of incision and drainage  and placement of J-P drain.  Wound cultures and blood cultures were sent;  wound culture came back on hospital day #3 as positive for MRSA.  Unasyn was  then discontinued; PICC line was placed.  On January 6 , 2006, infectious  disease specialist was consulted.  The recommendation was to continue the  vancomycin for 2 weeks and then start doxycycline for another week to make  the total treatment for 3 weeks.  ID recommendations were followed for  antibiotic management.  Home health services were consulted for continuous  infused management of wound at home.   #2 - DIABETES MELLITUS, TYPE 2:  Initially, the Metformin was held, and  sliding-scale insulin was instituted.  By hospital day #3, her Metformin was  resumed at her usual dose of 250 mg daily, but her blood sugars were  suboptimally controlled with that dose.  Subsequently, the dose was  increased to 500 mg t.i.d.  Also, patient was instructed to follow up with  Dr. Barbee Shropshire.  She had an appointment on August 03, 2004, set up for follow  up.  She was referred to Dr. Barbee Shropshire for overall diabetic teaching and  management of diabetes.   #3 - HYPERTENSION:  The patient was started on her usual dose of Maxzide.  Throughout the hospital stay, her blood pressure was well-controlled, and  the same dose was continued.   #4 - DYSLIPIDEMIA:  The patient was on Zocor, and her usual dose was  continued.  The patient was to follow up with Dr. Barbee Shropshire, as outpatient  for further ms of her hypertension and dyslipidemia.   #5 - MORBID OBESITY:  The patient was counseled on healthy eating habits and  exercise.   DISCHARGE LABORATORY DATA:  Labs upon discharge:  Her blood cultures were negative by the day of discharge.  Anaerobic cultures came  back 1 day  postdischarge and were negative for any anaerobes.  Serum creatinine was  0.5.  CBC done on July 27, 2004, showed an improved white cell count of  8.6, hemoglobin 13.4, hematocrit 38.3, platelet count 264,000.  BMP was  sodium 135, potassium 3.8, chloride 103, CO2 27, glucose 141, BUN 6,  creatinine 0.6.   FOLLOW UP APPOINTMENT:  The patient was to follow up with Dr. Barbee Shropshire as  outpatient.  She was also to make an appointment with Dr. Jamey Ripa within 1  week for further follow up.  She was sent and discharged to home with home  health care for administration of antibiotics and for lab work to check her  creatinine and CBC.      PMJ/MEDQ  D:  10/01/2004  T:  10/01/2004  Job:  [id number]   cc:   Olene Craven, M.D.  [address]  [address]  [place]  [place] [address]  Fax: [phone]

## 2010-12-09 NOTE — H&P (Signed)
NAME:  Karen Bennett, Karen Bennett                ACCOUNT NO.:  [id number]   MEDICAL RECORD NO.:  [id number]          PATIENT TYPE:  EMS   LOCATION:  ED                           FACILITY:  [facility]   PHYSICIAN:  Gertha Calkin, M.D.DATE OF BIRTH:  Jul 19, 1976   DATE OF ADMISSION:  07/24/2004  DATE OF DISCHARGE:                                HISTORY & PHYSICAL   PRIMARY CARE PHYSICIAN:  Unassigned.   HISTORY OF PRESENT ILLNESS:  This is a 35 year old Caucasian female with  history of hypertension, diabetes newly diagnosed, as well as dyslipidemia  which was newly diagnosed from her most recent admission and discharged in  November of this year.  She also is a tobacco user.  She comes to the ED  today complaining of left lower extremity swelling and pain.  This started  yesterday.  She denies fevers or chills.  No headaches, constipation, or  diarrhea.  No abdominal pain.  Regarding the swelling in her extremities,  she states that she had no recent history of trauma but did hurt her leg  approximately a year and one-half ago.  She states the swelling has been  persistent since then.  Initial incident when she was evaluated, she was  told this was a hematoma.  She states that this swelling has never gone  down.  Even on the prior admission, nothing was done.  She denies any  recent surgeries or recent trauma.   PAST MEDICAL HISTORY:  1.  Diabetes.  2.  Hypertension.  3.  Dyslipidemia.  4.  Tobacco abuse.   MEDICATIONS:  Her discharge medications included:  1.  Zocor 20 mg daily.  2.  Ambien 5 mg q.h.s. p.r.n.  3.  Maxzide 37.5/25 daily.  4.  Glucophage 250 daily.  5.  Aspirin 81 mg daily.  6.  Percocet p.r.n.   ALLERGIES:  None.   SOCIAL HISTORY:  Unemployed, lives with her mother.  She smokes one-half  pack a day for 14 years.  Denies alcohol abuse but does drink socially.  Denies all other illicit drugs.   FAMILY HISTORY:  Negative for hypertension, coronary artery disease,  diabetes.   REVIEW OF SYSTEMS:  No weight loss or changes, no blurred vision, otherwise  Review of Systems negative.   PHYSICAL EXAMINATION:  VITAL SIGNS:  Temperature 98.4, blood pressure  126/76, heart rate 87, respirations 20, 96% on room air.  HEENT:  Unremarkable.  NECK:  Supple without masses.  CHEST: Clear to auscultation bilaterally with good air movement.  CARDIOVASCULAR:  Regular rate and rhythm with no murmurs, rubs, or gallops.  ABDOMEN:  Markedly obese with positive bowel sounds and no  hepatosplenomegaly.  EXTREMITIES:  She has significant band-like swelling which is warm and  tender to light palpation in the left lower extremity right above the  malleolus.  The right leg shows a bandage over the previously drained  abscess from her previous admission.  There is significant 2+ pitting edema  in bilateral extremities.  No palpable cords noted in the left lower  extremity above this area.  She has negative Homan's sign.  NEUROLOGIC:  Cranial nerves are intact without deficit.  Alert and oriented  x 3.  Strength is 5/5.   LABORATORY DATA AND OTHER STUDIES:  White count 19.5, hemoglobin 15.3,  hematocrit 44, platelets 273.  Sodium 136, potassium 4.1, chloride 105,  carbon dioxide 23, glucose 150, BUN 8, creatinine 0.6, calcium 9.4.  Total  protein 6.8, albumin 3.9.  She has a PT of 12.6, INR 0.9, PTT 23.  Blood  cultures were obtained, two sets.  Those have been drawn already.  Total  protein 6.8, albumin 3.9, AST 26, ALT 48, alkaline phosphatase 61, bilirubin  1.5.   IMPRESSION:  1.  Left lower extremity swelling.  2.  Diabetes.  3.  Hypertension.  4.  Dyslipidemia.  5.  Tobacco abuse.   PLAN:  1.  Get lower extremity Dopplers; however, likely this is an abscess.      Surgery is already consulted as they had been following her on her      previous admission.  2.  I will start her empirically on vancomycin and Unasyn until culture data      back.  We can tailor  medications towards sensitivities.  3.  Will give her pain control.  4.  Will hold metformin and check CBGs __________,  5.  Will resume antihypertensive medications and give her a patch at this      time.      JD/MEDQ  D:  07/24/2004  T:  07/24/2004  Job:  [id number]   cc:   Currie Paris, M.D.  [address], [address]  [place]  [place] [address]  Fax: [phone]   Lorre Munroe., M.D.  Fax: [phone]   Rockey Situ. Flavia Shipper., M.D.  [address]  [place]  [place] [address]  Fax: [phone]

## 2010-12-09 NOTE — Op Note (Signed)
NAME:  Karen Bennett, Karen Bennett                ACCOUNT NO.:  [id number]   MEDICAL RECORD NO.:  [id number]          PATIENT TYPE:  INP   LOCATION:  0101                         FACILITY:  [facility]   PHYSICIAN:  Currie Paris, M.D.DATE OF BIRTH:  12-30-1975   DATE OF PROCEDURE:  07/24/2004  DATE OF DISCHARGE:                                 OPERATIVE REPORT   PREOPERATIVE DIAGNOSIS:  Chronic hematoma cavity, left lower extremity,  developing abscess.   POSTOPERATIVE DIAGNOSIS:  Chronic hematoma cavity, left lower extremity,  developing abscess.   PROCEDURE:  Incision and drainage.   SURGEON:  Currie Paris, M.D.   ANESTHESIA:  MAC.   INDICATIONS FOR PROCEDURE:  This patient is a 35 year old morbidly obese  lady with what was thought to be a large hematoma in the left lower  extremity anteriorly.  Recently, this has gotten more tender, swollen, and  the patient has developed an increased white count.  By physical, it was  felt that this was becoming infected.  We discussed the procedure with the  patient, to bring her to the operating room and drain this under anesthesia.   DESCRIPTION OF PROCEDURE:  The patient was seen in the holding area and had  no further questions.  The area on the left lower quadrant was marked by me  and confirmed by the patient. She was taken to the operating room and given  IV sedation.  Lower extremity was prepped with Betadine and draped.  Initially, I infiltrated a little local and then aspirated this area and got  a cloudy, but mainly more serous fluid than pus.   At this point, I put a little more local in and made a vertical incision  over the most fluctuant area, and entered a large, chronic cavity. This had  a well-formed wall consistent with an old hematoma cavity or some other  seroma cavity.  This was completely emptied.  I elected, rather than to pack  this with a large open area, to instead see if we could get this to close  over a drain.  I  put more local in medially, made a small stab wound to  place a 19 Blake drain. This was brought into the wound and cut to the  appropriate length and sutured with a 2-0 nylon to the skin.  We irrigated  the area and then closed with staples.  The patient tolerated the procedure  well, there were no operative complications and all counts were correct.     Chri   CJS/MEDQ  D:  07/24/2004  T:  07/24/2004  Job:  [id number]

## 2010-12-09 NOTE — Discharge Summary (Signed)
Karen Bennett, Karen Bennett                ACCOUNT NO.:  [id number]   MEDICAL RECORD NO.:  [id number]          PATIENT TYPE:  INP   LOCATION:  0357                         FACILITY:  [facility]   PHYSICIAN:  Michaelyn Barter, M.D. DATE OF BIRTH:  08-03-1975   DATE OF ADMISSION:  06/13/2004  DATE OF DISCHARGE:  06/22/2004                                 DISCHARGE SUMMARY   PRIMARY CARE PHYSICIAN:  Unassigned.   HISTORY OF PRESENT ILLNESS:  The patient is a 35 year old female who arrived  in the emergency room with a chief complaint of swelling and pain within her  right leg.  She had been seen a few days previously with the same complaint  and was discharged home on Keflex.  The antibiotics apparently did not help.  Therefore, she came back to the hospital.   PAST MEDICAL HISTORY:  Perianal abscess in 2002.   MEDICATIONS:  No medications.   ALLERGIES:  No allergies.   SOCIAL HISTORY:  Cigarettes:  The patient smokes a half a pack per day.  Alcohol:  The patient denies.   After evaluation of the patient's leg, she was admitted into the hospital  with the diagnosis of cellulitis of the right lower extremity.  She was  initially started on Unasyn.  Venous Dopplers of the lower extremity were  ordered in order to rule out the presence of deep venous thrombosis.  Results of the duplex revealed no evidence of DVT, superficial thrombus, or  Baker's cyst.  The day following the patient's admission into the hospital,  she stated that her leg appeared to be more painful, and it was noted that  there were a few areas which were darker in color.  The patient was  subsequently started on vancomycin as a result for the cellulitis.  Likewise, the patient had never been diagnosed with diabetes mellitus.  However, a hemoglobin A1c was checked, the result of which was 6.4.  Therefore, metformin was initiated.  Lovenox was also started as a DVT  prophylaxis.  By the following day, the patient stated that her  leg was  still painful, although it was less tight than previously.  It was noted  that the patient's right leg continued to remain red in color and painful.  Therefore, the decision was made to consult infectious disease for further  evaluation and help with regard to antibiotic choice.  Blood cultures were  also drawn.  Blood cultures were negative, however.  After Dr. Lenn Sink examined the patient, he vocalized concern that the patient may  have developed an abscess, and surgery was consulted.  Dr. Orson Slick was the  surgeon who responded to the consult.  Dr. Orson Slick had agreed that the  patient had developed an abscess and decided to drain the abscess.  His note  indicated that the patient had a large amount of purulent pus material  within her right lower extremity and that initial drainage took place on  June 20, 2004.  On June 21, 2004, Dr. Orson Slick noted that the patient  was still draining some purulent material.  However, by this  time, the  surrounding area of erythema had decreased.  Dr. Orvan Falconer was the ID  physician who took over after Dr. Roxan Hockey, and by June 22, 2004 surgery  stated that the patient was doing okay, and from their point of view it  would be okay to discharge the patient home.  Likewise, ID concurred that it  would be okay to discharge the patient home and that she could be switched  to p.o. antibiotics.  Therefore, the decision was made to discharge the  patient home.  The patient was discharged home in an improved condition.   DISCHARGE MEDICATIONS:  1.  Zocor 20 mg 1 tablet daily.  2.  Ambien 5 mg 1 tablet at bedtime.  3.  Maxzide 37.5/25, 1 tablet daily.  4.  Glucophage 250 mg 1 tablet daily.  5.  Aspirin 81 mg p.o. daily.  6.  Percocet 5 mg 1 tablet q.6 h. p.r.n. for pain.  7.  Augmentin 875 mg was given x 1 prior to discharge.   The patient was set up with [facility] for dressing changes.  She  was instructed to take all of  her medications as prescribed, to find a  primary care doctor or to go to [facility] within one to two weeks to  follow up treatment of her leg abscess, her newly diagnosed diabetes, and  her newly diagnosed high blood pressure.  She was also instructed to perform  glucose checks one to two times a day and to keep a diary of her sugars.  She was instructed also to consume a low salt 1,800 kcal ADA diet.      Laymond Purser   OR/MEDQ  D:  07/01/2004  T:  07/02/2004  Job:  [id number]

## 2010-12-09 NOTE — Consult Note (Signed)
NAME:  Karen Bennett, Karen Bennett                ACCOUNT NO.:  [id number]   MEDICAL RECORD NO.:  [id number]          PATIENT TYPE:  INP   LOCATION:  0357                         FACILITY:  [facility]   PHYSICIAN:  Lorre Munroe., M.D.DATE OF BIRTH:  09/24/1975   DATE OF CONSULTATION:  06/20/2004  DATE OF DISCHARGE:                                   CONSULTATION   CHIEF COMPLAINT:  Pain in the right leg.   HISTORY OF PRESENT ILLNESS:  This is a 35 year old morbidly obese white  female who had the spontaneous development of swelling, pain, and redness  anterior right leg. She does not recall any trauma. She does not recall any  bits but noticed an area of discoloration and subsequent pain and swelling.  There has been fever at times. No procedures have been done, and there had  been no drainage or pus. There had been development of blister on the area.  The patient has been in the hospital and on intravenous antibiotic  treatment, and I am now consulted to consider surgical treatment of the  area.   PAST MEDICAL HISTORY:  No previous similar problems. The patient has had  problems with her weight for a very long time. She denies diabetes. She had  a perianal abscess drained in 2002. She is currently on no medications. No  medicine allergies. No major operations. Review of systems, family history,  childhood illnesses unremarkable.   PHYSICAL EXAMINATION:  I restricted my exam to the lower extremities. The  patient's legs are massively swollen, but there is not much edema on the  left. Moderate right leg below the knee. There is an area of redness, and  induration without fluctuance anterior right leg. There is some skin  discoloration and fasciculation.   IMPRESSION:  Abscess, right leg, with cellulitis.   PROCEDURE:  After the patient gave me consent, I thoroughly anesthetized the  skin and subcutaneous tissue and deeper tissues in the maximum point of  discoloration and induration. I aspirated  pus and then incised the abscess  with a drainage of copiously yellow-gray pus which I had quite a bit  pressure on it. I then cleansed the abscess cavity, took a culture, and  packed it. Bulky bandage was secured. The patient tolerated that well. I  will change the bandage tomorrow, and it can be reinforced or changed as  necessary today.     Will   WB/MEDQ  D:  06/20/2004  T:  06/20/2004  Job:  [id number]   cc:   Rockey Situ. Flavia Shipper., M.D.  [address]  [place]  [place] [address]  Fax: [phone]

## 2010-12-09 NOTE — H&P (Signed)
[name], [name]                ACCOUNT NO.:  [id number]   MEDICAL RECORD NO.:  [id number]          PATIENT TYPE:  INP   LOCATION:  0357                         FACILITY:  [facility]   PHYSICIAN:  Burnice Logan, M.D.  DATE OF BIRTH:  [birth year]   DATE OF ADMISSION:  06/13/2004  DATE OF DISCHARGE:                                HISTORY & PHYSICAL   CHIEF COMPLAINT:  Painful swelling, right leg.   HISTORY OF PRESENT ILLNESS:  The patient is a 35 year old, mildly obese,  Caucasian lady who was seen in the emergency room with the above complaint.  She had earlier been seen in the emergency room a few days ago with similar  complaint and had been discharged on Keflex.  She has not done any better  with the oral antibiotics and thus came back to the emergency room.  She is  being admitted at this time with cellulitis of her right lower extremity.  She tells me that she had her venous Doppler done last Wednesday, about five  days ago, and this exam was negative for any DVT.   PAST MEDICAL HISTORY:  1.  Perianal abscess in 2002.  2.  She denies any knowledge of hypertension, diabetes, hyperlipidemia.   MEDICATIONS:  No usual medications.   ALLERGIES:  None.   SOCIAL HISTORY:  She is unemployed and lives with her mother.  She smokes  half a pack of cigarettes daily.  She denies the use of alcohol.   REVIEW OF SYSTEMS:  She does not recall any trauma or injury to the right  lower extremity.  No fevers or chills.  She denies any cough, chest pain,  sputum production. No change in bowel habits, no polyuria, dysuria.  All  other systems negative.   PHYSICAL EXAMINATION:  GENERAL:  Mildly obese, Caucasian lady in no acute  distress.  VITAL SIGNS: Temperature 98.3, blood pressure 155/79, heart rate 101.  HEENT:  She is normocephalic.  Symmetric pupils.  Normal external eye  movements.  Pink conjunctivae.  Mucous membranes.  Throat unremarkable.  NECK:  Obese.  Supple.  No masses.  LUNGS:   Clear to auscultation.  Unlabored breathing without use of  accessories.  CARDIOVASCULAR:  Heart sounds S1 and S2 regular.  Apex difficult to locate.  ABDOMEN:  Morbidly obese.  Soft, nontender.  CNS:  Alert and oriented x3.  No focal deficits.  EXTREMITIES:  Markedly swollen and erythematous.  Tender right lower  extremity with swelling, extending from the ankle to the knee area.  No open  wounds or drainage is noted.  Pulses are difficult to feel.   LABORATORY DATA:  White count 12.6, hemoglobin 14.  Glucose 160, creatinine  0.6.   ASSESSMENT/PLAN:  1.  Cellulitis of the right lower extremity.  2.  Morbid obesity.  3.  Elevated blood glucose.   PLAN:  We need to admit the patient for IV antibiotic therapy.  I will be  treating her with Unasyn.  Will repeat the leg vein Doppler to exclude any  deep venous thrombosis because the patient is prone to thromboembolic events  on account of her weight alone.  She has elevated blood glucose.  I will be  checking her hemoglobin A1C level.      ES/MEDQ  D:  06/13/2004  T:  06/14/2004  Job:  [id number]

## 2011-04-19 LAB — POCT I-STAT, CHEM 8
BUN: 4 — ABNORMAL LOW
Calcium, Ion: 1.09 — ABNORMAL LOW
Chloride: 100
Creatinine, Ser: 0.4
Glucose, Bld: 414 — ABNORMAL HIGH

## 2011-04-21 LAB — GLUCOSE, CAPILLARY
Glucose-Capillary: 245 — ABNORMAL HIGH
Glucose-Capillary: 257 — ABNORMAL HIGH
Glucose-Capillary: 267 — ABNORMAL HIGH
Glucose-Capillary: 272 — ABNORMAL HIGH
Glucose-Capillary: 272 — ABNORMAL HIGH
Glucose-Capillary: 275 — ABNORMAL HIGH
Glucose-Capillary: 280 — ABNORMAL HIGH
Glucose-Capillary: 315 — ABNORMAL HIGH

## 2011-04-21 LAB — CBC
HCT: 40.5
Hemoglobin: 12.8
MCHC: 35.1
MCV: 88.6
Platelets: 244
Platelets: 265
Platelets: 270
RDW: 12.8
RDW: 12.9
WBC: 6.8

## 2011-04-21 LAB — POCT I-STAT, CHEM 8
BUN: 5 — ABNORMAL LOW
Calcium, Ion: 1.14
Glucose, Bld: 304 — ABNORMAL HIGH
HCT: 43
TCO2: 28

## 2011-04-21 LAB — COMPREHENSIVE METABOLIC PANEL
ALT: 17
AST: 28
Albumin: 2.9 — ABNORMAL LOW
Alkaline Phosphatase: 45
Alkaline Phosphatase: 49
BUN: 4 — ABNORMAL LOW
CO2: 26
Calcium: 8.8
Chloride: 99
GFR calc Af Amer: >60
GFR calc non Af Amer: >60
Glucose, Bld: 267 — ABNORMAL HIGH
Potassium: 4.2
Sodium: 133 — ABNORMAL LOW
Total Bilirubin: 0.7
Total Protein: 6.4

## 2011-04-21 LAB — URINALYSIS, ROUTINE W REFLEX MICROSCOPIC
Glucose, UA: >1000 — AB
Hgb urine dipstick: NEGATIVE
Nitrite: NEGATIVE
Nitrite: NEGATIVE
Protein, ur: 100 — AB
Protein, ur: 30 — AB
Specific Gravity, Urine: 1.028
Specific Gravity, Urine: 1.041 — ABNORMAL HIGH
Urobilinogen, UA: 0.2
Urobilinogen, UA: 1

## 2011-04-21 LAB — CK TOTAL AND CKMB (NOT AT ARMC)
Relative Index: INVALID
Total CK: 19

## 2011-04-21 LAB — CULTURE, BLOOD (ROUTINE X 2): Culture: NO GROWTH

## 2011-04-21 LAB — PHOSPHORUS: Phosphorus: 4

## 2011-04-21 LAB — URINE CULTURE: Colony Count: >=100000

## 2011-04-21 LAB — HEMOGLOBIN A1C
Hgb A1c MFr Bld: 11 — ABNORMAL HIGH
Mean Plasma Glucose: 314

## 2011-04-21 LAB — DIFFERENTIAL
Basophils Absolute: 0
Eosinophils Absolute: 0.1
Eosinophils Relative: 2

## 2011-04-21 LAB — URINE MICROSCOPIC-ADD ON

## 2011-04-21 LAB — BASIC METABOLIC PANEL
BUN: 7
CO2: 23
Calcium: 8.5
Creatinine, Ser: 0.58
Glucose, Bld: 298 — ABNORMAL HIGH
Sodium: 129 — ABNORMAL LOW

## 2011-04-21 LAB — LIPASE, BLOOD: Lipase: 15

## 2011-04-21 LAB — B-NATRIURETIC PEPTIDE (CONVERTED LAB): Pro B Natriuretic peptide (BNP): <30

## 2011-04-24 LAB — COMPREHENSIVE METABOLIC PANEL
ALT: 19
Alkaline Phosphatase: 41
CO2: 23
Calcium: 9.1
Chloride: 100
GFR calc non Af Amer: >60
Glucose, Bld: 317 — ABNORMAL HIGH
Potassium: 3.4 — ABNORMAL LOW
Sodium: 131 — ABNORMAL LOW
Total Bilirubin: 1.3 — ABNORMAL HIGH

## 2011-04-24 LAB — CARDIOLIPIN ANTIBODIES, IGG, IGM, IGA
Anticardiolipin IgA: <7 — ABNORMAL LOW (ref <13)
Anticardiolipin IgG: <7 — ABNORMAL LOW (ref <11)
Anticardiolipin IgM: 16 (ref <10)

## 2011-04-24 LAB — BASIC METABOLIC PANEL
BUN: 12
CO2: 24
CO2: 25
Calcium: 9.2
Chloride: 100
Chloride: 101
Creatinine, Ser: 0.67
Creatinine, Ser: 0.69
GFR calc Af Amer: >60
GFR calc Af Amer: >60
Potassium: 4
Sodium: 131 — ABNORMAL LOW

## 2011-04-24 LAB — GLUCOSE, CAPILLARY
Glucose-Capillary: 195 — ABNORMAL HIGH
Glucose-Capillary: 203 — ABNORMAL HIGH
Glucose-Capillary: 206 — ABNORMAL HIGH
Glucose-Capillary: 214 — ABNORMAL HIGH
Glucose-Capillary: 226 — ABNORMAL HIGH
Glucose-Capillary: 235 — ABNORMAL HIGH
Glucose-Capillary: 251 — ABNORMAL HIGH
Glucose-Capillary: 262 — ABNORMAL HIGH
Glucose-Capillary: 285 — ABNORMAL HIGH
Glucose-Capillary: 311 — ABNORMAL HIGH
Glucose-Capillary: 320 — ABNORMAL HIGH

## 2011-04-24 LAB — BETA-2-GLYCOPROTEIN I ABS, IGG/M/A
Beta-2 Glyco I IgG: <4 U/mL (ref <20)
Beta-2-Glycoprotein I IgM: <4 U/mL (ref <10)

## 2011-04-24 LAB — CBC
HCT: 37.1
Hemoglobin: 12.9
Hemoglobin: 13.1
MCHC: 34.8
MCHC: 35.1
MCHC: 35.5
MCV: 90.9
MCV: 91
Platelets: 217
RBC: 4.03
RBC: 4.08
RBC: 4.09
WBC: 8.2

## 2011-04-24 LAB — POCT I-STAT, CHEM 8
BUN: 7
Creatinine, Ser: 0.8
Hemoglobin: 13.9
Potassium: 4
Sodium: 131 — ABNORMAL LOW

## 2011-04-24 LAB — LUPUS ANTICOAGULANT PANEL
DRVVT: 44.8 (ref 36.1–47.0)
Lupus Anticoagulant: NOT DETECTED

## 2011-04-24 LAB — ANTITHROMBIN III: AntiThromb III Func: 121 — ABNORMAL HIGH (ref 76–126)

## 2011-04-24 LAB — PROTIME-INR
INR: 2 — ABNORMAL HIGH
INR: 2.6 — ABNORMAL HIGH
Prothrombin Time: 24.3 — ABNORMAL HIGH
Prothrombin Time: 29.4 — ABNORMAL HIGH

## 2011-04-24 LAB — PROTEIN C, TOTAL: Protein C, Total: 130 % (ref 70–140)

## 2011-04-24 LAB — HEMOGLOBIN A1C: Hgb A1c MFr Bld: 11.9 — ABNORMAL HIGH

## 2011-04-24 LAB — POCT CARDIAC MARKERS
CKMB, poc: 1
Myoglobin, poc: 72.1
Troponin i, poc: <0.05

## 2011-04-24 LAB — PROTHROMBIN GENE MUTATION

## 2011-05-03 LAB — LIPID PANEL
LDL Cholesterol: UNDETERMINED
Total CHOL/HDL Ratio: 6.7
Triglycerides: 711 — ABNORMAL HIGH
VLDL: UNDETERMINED

## 2011-05-03 LAB — CBC
HCT: 27.6 — ABNORMAL LOW
HCT: 29 — ABNORMAL LOW
HCT: 32.2 — ABNORMAL LOW
HCT: 33.1 — ABNORMAL LOW
HCT: 41
Hemoglobin: 10 — ABNORMAL LOW
Hemoglobin: 11.8 — ABNORMAL LOW
Hemoglobin: 12.5
Hemoglobin: 12.9
Hemoglobin: 9.8 — ABNORMAL LOW
MCHC: 34.9
MCHC: 35
MCHC: 35.2
MCHC: 35.3
MCHC: 35.5
MCHC: 35.5
MCV: 88.6
MCV: 88.8
MCV: 88.9
MCV: 90.2
Platelets: 262
Platelets: 264
Platelets: 272
RBC: 3.1 — ABNORMAL LOW
RBC: 4.03
RBC: 4.11
RDW: 12.5
RDW: 12.6
RDW: 12.7
RDW: 12.8
RDW: 12.8
RDW: 12.8
WBC: 18.1 — ABNORMAL HIGH
WBC: 9

## 2011-05-03 LAB — DIFFERENTIAL
Basophils Relative: 0
Eosinophils Absolute: 0
Eosinophils Relative: 0
Lymphs Abs: 1.8
Neutrophils Relative %: 87 — ABNORMAL HIGH

## 2011-05-03 LAB — BASIC METABOLIC PANEL
BUN: 4 — ABNORMAL LOW
BUN: 5 — ABNORMAL LOW
CO2: 22
CO2: 25
CO2: 25
CO2: 26
Calcium: 8.5
Calcium: 8.5
Calcium: 8.8
Chloride: 100
Chloride: 102
Chloride: 94 — ABNORMAL LOW
Chloride: 99
Creatinine, Ser: 0.57
Creatinine, Ser: 0.57
Creatinine, Ser: 0.62
GFR calc Af Amer: >60
GFR calc Af Amer: >60
GFR calc non Af Amer: >60
GFR calc non Af Amer: >60
Glucose, Bld: 140 — ABNORMAL HIGH
Glucose, Bld: 220 — ABNORMAL HIGH
Glucose, Bld: 222 — ABNORMAL HIGH
Glucose, Bld: 233 — ABNORMAL HIGH
Potassium: 3.6
Potassium: 3.7
Potassium: 3.8
Sodium: 128 — ABNORMAL LOW
Sodium: 133 — ABNORMAL LOW
Sodium: 135
Sodium: 137

## 2011-05-03 LAB — VANCOMYCIN, TROUGH: Vancomycin Tr: 15.9

## 2011-05-03 LAB — CULTURE, BLOOD (ROUTINE X 2)

## 2011-05-03 LAB — GRAM STAIN

## 2011-05-03 LAB — CREATININE, SERUM
Creatinine, Ser: 0.55
GFR calc non Af Amer: >60

## 2011-05-03 LAB — ANAEROBIC CULTURE

## 2011-05-03 LAB — HEPATIC FUNCTION PANEL
AST: 22
Albumin: 2.2 — ABNORMAL LOW
Bilirubin, Direct: 0.3
Total Protein: 6

## 2011-05-03 LAB — CULTURE, ROUTINE-ABSCESS

## 2011-05-03 LAB — OSMOLALITY: Osmolality: 276

## 2011-05-04 LAB — WOUND CULTURE

## 2011-05-05 LAB — URINALYSIS, ROUTINE W REFLEX MICROSCOPIC
Glucose, UA: NEGATIVE
Ketones, ur: NEGATIVE
pH: 5.5

## 2011-05-05 LAB — URINE MICROSCOPIC-ADD ON

## 2011-05-05 LAB — DIFFERENTIAL
Basophils Relative: 0
Eosinophils Relative: 0
Monocytes Absolute: 1.1 — ABNORMAL HIGH
Monocytes Relative: 9
Neutro Abs: 9.2 — ABNORMAL HIGH

## 2011-05-05 LAB — COMPREHENSIVE METABOLIC PANEL
ALT: 22
AST: 19
Albumin: 3.7
Alkaline Phosphatase: 51
BUN: 4 — ABNORMAL LOW
GFR calc Af Amer: >60
Potassium: 4.2
Sodium: 134 — ABNORMAL LOW
Total Protein: 7.3

## 2011-05-05 LAB — URINE CULTURE

## 2011-05-05 LAB — CBC
Platelets: 211
RDW: 12.3
WBC: 11.8 — ABNORMAL HIGH

## 2011-08-15 ENCOUNTER — Encounter (HOSPITAL_COMMUNITY): Payer: Self-pay | Admitting: *Deleted

## 2011-08-15 ENCOUNTER — Inpatient Hospital Stay (HOSPITAL_COMMUNITY)
Admission: EM | Admit: 2011-08-15 | Discharge: 2011-08-17 | DRG: 603 | Disposition: A | Discharge status: Home / Self Care | Payer: Medicaid Other | Attending: Internal Medicine | Admitting: Internal Medicine

## 2011-08-15 DIAGNOSIS — L0390 Cellulitis, unspecified: Secondary | ICD-10-CM | POA: Diagnosis present

## 2011-08-15 DIAGNOSIS — IMO0002 Reserved for concepts with insufficient information to code with codable children: Secondary | ICD-10-CM | POA: Diagnosis present

## 2011-08-15 DIAGNOSIS — Z794 Long term (current) use of insulin: Secondary | ICD-10-CM | POA: Diagnosis present

## 2011-08-15 DIAGNOSIS — L02419 Cutaneous abscess of limb, unspecified: Principal | ICD-10-CM | POA: Diagnosis present

## 2011-08-15 DIAGNOSIS — Z6841 Body Mass Index (BMI) 40.0 and over, adult: Secondary | ICD-10-CM

## 2011-08-15 DIAGNOSIS — L03119 Cellulitis of unspecified part of limb: Secondary | ICD-10-CM | POA: Diagnosis present

## 2011-08-15 DIAGNOSIS — E1165 Type 2 diabetes mellitus with hyperglycemia: Secondary | ICD-10-CM | POA: Diagnosis present

## 2011-08-15 DIAGNOSIS — E118 Type 2 diabetes mellitus with unspecified complications: Secondary | ICD-10-CM

## 2011-08-15 DIAGNOSIS — E119 Type 2 diabetes mellitus without complications: Secondary | ICD-10-CM | POA: Diagnosis present

## 2011-08-15 DIAGNOSIS — L02619 Cutaneous abscess of unspecified foot: Secondary | ICD-10-CM | POA: Diagnosis present

## 2011-08-15 HISTORY — DX: Other pulmonary embolism without acute cor pulmonale: I26.99

## 2011-08-15 HISTORY — DX: Cellulitis, unspecified: L03.90

## 2011-08-15 LAB — CBC
HCT: 41.3 % (ref 36.0–46.0)
Hemoglobin: 14.7 g/dL (ref 12.0–15.0)
MCH: 30.6 pg (ref 26.0–34.0)
MCHC: 35.6 g/dL (ref 30.0–36.0)

## 2011-08-15 LAB — BASIC METABOLIC PANEL
BUN: 13 mg/dL (ref 6–23)
Creatinine, Ser: 0.51 mg/dL (ref 0.50–1.10)
GFR calc Af Amer: >90 mL/min (ref >90)
GFR calc non Af Amer: >90 mL/min (ref >90)

## 2011-08-15 LAB — DIFFERENTIAL
Basophils Relative: 0 % (ref 0–1)
Eosinophils Absolute: 0.1 10*3/uL (ref 0.0–0.7)
Eosinophils Relative: 1 % (ref 0–5)
Monocytes Absolute: 0.6 10*3/uL (ref 0.1–1.0)
Monocytes Relative: 8 % (ref 3–12)

## 2011-08-15 MED ORDER — INFLUENZA VIRUS VACC SPLIT PF IM SUSP
0.5000 mL | INTRAMUSCULAR | Status: AC
  Administered 2011-08-16: 0.5 mL via INTRAMUSCULAR
  Filled 2011-08-15 (×2): qty 0.5

## 2011-08-15 MED ORDER — ONDANSETRON HCL 4 MG/2ML IJ SOLN
4.0000 mg | Freq: Once | INTRAMUSCULAR | Status: DC
  Filled 2011-08-15: qty 2

## 2011-08-15 MED ORDER — HYDROMORPHONE HCL PF 1 MG/ML IJ SOLN
1.0000 mg | Freq: Once | INTRAMUSCULAR | Status: AC
  Administered 2011-08-15: 1 mg via INTRAVENOUS
  Filled 2011-08-15: qty 1

## 2011-08-15 MED ORDER — VANCOMYCIN HCL IN DEXTROSE 1-5 GM/200ML-% IV SOLN
1000.0000 mg | INTRAVENOUS | Status: AC
  Administered 2011-08-15: 1000 mg via INTRAVENOUS
  Filled 2011-08-15: qty 200

## 2011-08-15 MED ORDER — SODIUM CHLORIDE 0.9 % IV BOLUS (SEPSIS): 500.0000 mL | Freq: Once | INTRAVENOUS | Status: DC

## 2011-08-15 MED ORDER — PROMETHAZINE HCL 25 MG/ML IJ SOLN
12.5000 mg | Freq: Once | INTRAMUSCULAR | Status: AC
  Administered 2011-08-15: 12.5 mg via INTRAVENOUS
  Filled 2011-08-15: qty 1

## 2011-08-15 NOTE — H&P (Signed)
PCP:   None  Chief Complaint:  Left leg swelling, redness and pain  HPI: Karen Bennett is a 36 year old morbidly obese female with type 2 diabetes which is uncontrolled presents to the emergency room with left lower leg swelling redness and pain. Patient reports that she noted a blister on the base of her left foot about 4 days ago, subsequently she noted redness and swelling of her foot which progressively spread up her leg to involve her lower leg. Ambulation is limited due to his severe pain in her left foot. She denies any history of trauma, fevers or chills. She states that her diabetes is uncontrolled she cannot afford to take medicines.   Allergies:   Allergies  Allergen Reactions  . Zofran Itching and Nausea And Vomiting  . Doxycycline       Past Medical History  Diagnosis Date  . Cellulitis   . Diabetes mellitus   . 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 Surgery to remove hematoma in l leg   . Abscess removal from l groin     Prior to Admission medications   Medication Sig Start Date End Date Taking? Authorizing Provider  acetaminophen (TYLENOL) 500 MG tablet Take 1,000 mg by mouth every 6 (six) hours as needed. For pain.   Yes Historical Provider, MD  ibuprofen (ADVIL,MOTRIN) 200 MG tablet Take 400 mg by mouth every 8 (eight) hours as needed. For pain.   Yes Historical Provider, MD    Social History:Single, lives alone in a trailer  , smokes 4 cigarettes a day, denies any alcohol or illicit drug use   Family history: Mother has CAD and had a CVA, father deceased secondary to GI hemorrhage  Review of Systems:  Constitutional: Denies fever, chills, diaphoresis, appetite change and fatigue.  HEENT: Denies photophobia, eye pain, redness, hearing loss, ear pain, congestion, sore throat, rhinorrhea, sneezing, mouth sores,  trouble swallowing, neck pain, neck stiffness and tinnitus.   Respiratory: Denies SOB, DOE, cough, chest tightness,  and wheezing.   Cardiovascular: Denies chest pain, palpitations and leg swelling.  Gastrointestinal: Denies nausea, vomiting, abdominal pain, diarrhea, constipation, blood in stool and abdominal distention.  Genitourinary: Denies dysuria, urgency, frequency, hematuria, flank pain and difficulty urinating.  Musculoskeletal: Denies myalgias, back pain, joint swelling, arthralgias and gait problem.  Skin: Denies pallor, rash and wound.  Neurological: Denies dizziness, seizures, syncope, weakness, light-headedness, numbness and headaches.  Hematological: Denies adenopathy. Easy bruising, personal or family bleeding history  Psychiatric/Behavioral: Denies suicidal ideation, mood changes, confusion, nervousness, sleep disturbance and agitation   Physical Exam: Blood pressure 156/89, pulse 93, temperature 98.1 F (36.7 C), temperature source Oral, resp. rate 18, last menstrual period 07/15/2011, SpO2 98.00%. Morbidly obese female sitting in bed in no acute distress she is alert awake oriented x3 HEENT pupils equal reactive to light oral mucosa is moist for dental hygiene, no JVD unable to appreciate thyromegaly CVS S1-S2 regular rate rhythm no murmurs rubs or gallops Lungs distant breath sounds Abdomen obese soft nontender with normal bowel sounds no organomegaly Extremities: Left foot status post amputation of 2 toes, redness swelling overlying the dorsum of the foot, in addition over the plantar surface there is a small open wound without any obvious drainage but with surrounding swelling and areas concerning for small abscess, this area is fluctuant however  I am unable to express any pus from it. In addition erythema and swelling overlying three quarters of her left lower leg, that is tender and warm to touch.  Labs on Admission:  Results for orders placed during the hospital  encounter of 08/15/11 (from the past 48 hour(s))  CBC     Status: Normal   Collection Time   08/15/11  8:10 PM      Component Value Range Comment   WBC 7.9  4.0 - 10.5 (K/uL)    RBC 4.81  3.87 - 5.11 (MIL/uL)    Hemoglobin 14.7  12.0 - 15.0 (g/dL)    HCT 16.1  09.6 - 04.5 (%)    MCV 85.9  78.0 - 100.0 (fL)    MCH 30.6  26.0 - 34.0 (pg)    MCHC 35.6  30.0 - 36.0 (g/dL)    RDW 40.9  81.1 - 91.4 (%)    Platelets 191  150 - 400 (K/uL)   DIFFERENTIAL     Status: Normal   Collection Time   08/15/11  8:10 PM      Component Value Range Comment   Neutrophils Relative 65  43 - 77 (%)    Neutro Abs 5.1  1.7 - 7.7 (K/uL)    Lymphocytes Relative 26  12 - 46 (%)    Lymphs Abs 2.1  0.7 - 4.0 (K/uL)    Monocytes Relative 8  3 - 12 (%)    Monocytes Absolute 0.6  0.1 - 1.0 (K/uL)    Eosinophils Relative 1  0 - 5 (%)    Eosinophils Absolute 0.1  0.0 - 0.7 (K/uL)    Basophils Relative 0  0 - 1 (%)    Basophils Absolute 0.0  0.0 - 0.1 (K/uL)   BASIC METABOLIC PANEL     Status: Abnormal   Collection Time   08/15/11  8:10 PM      Component Value Range Comment   Sodium 130 (*) 135 - 145 (mEq/L)    Potassium 4.5  3.5 - 5.1 (mEq/L) SLIGHT HEMOLYSIS   Chloride 95 (*) 96 - 112 (mEq/L)    CO2 23  19 - 32 (mEq/L)    Glucose, Bld 305 (*) 70 - 99 (mg/dL)    BUN 13  6 - 23 (mg/dL)    Creatinine, Ser 7.82  0.50 - 1.10 (mg/dL)    Calcium 9.6  8.4 - 10.5 (mg/dL)    GFR calc non Af Amer >90  >90 (mL/min)    GFR calc Af Amer >90  >90 (mL/min)     Radiological Exams on Admission: No results found.  Assessment/Plan 1. left lower leg and foot cellulitis/ rule out osteomyelitis and underlying abscess 2. Uncontrolled diabetes type 2 3. Morbid obesity Plan Will admit to medical floor Start IV vancomycin and IV Zosyn to cover for gram negatives and anaerobes given uncontrolled diabetes Check an MRI of her left foot to rule out osteomyelitis or abscess In terms of her type 2 diabetes, check hemoglobin A1c start  Lantus and sliding scale Will need assistance from the case manager prior to discharge DVT prophylaxis with Lovenox   Time Spent on Admission:  Karen Bennett [facility] Pager: [phone] 08/15/2011, 10:00 PM

## 2011-08-15 NOTE — ED Notes (Signed)
Pt from home c/o L leg swelling, redness and pain that started Saturday night.

## 2011-08-15 NOTE — ED Provider Notes (Signed)
History     CSN: [id number]  Arrival date & time 08/15/11  1637   First MD Initiated Contact with Patient 08/15/11 1809      Chief Complaint  Patient presents with  . Leg Swelling    (Consider location/radiation/quality/duration/timing/severity/associated sxs/prior treatment) HPI... left lower extremity redness and swelling for several days. No fever or chills. Patient is type II diabetic but unable to afford medication. Additionally she is morbidly obese.  Palpation makes it worse. Nothing makes it better. Pain is moderate. No radiation   Past Medical History  Diagnosis Date  . Cellulitis   . Diabetes mellitus   . PE (pulmonary embolism)     Past Surgical History  Procedure Date  . 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 Surgery to remove hematoma in l leg   . Abscess removal from l groin     No family history on file.  History  Substance Use Topics  . Smoking status: Never Smoker   . Smokeless tobacco: Never Used  . Alcohol Use: No    OB History    Grav Para Term Preterm Abortions TAB SAB Ect Mult Living                  Review of Systems  All other systems reviewed and are negative.    Allergies  Zofran and Doxycycline  Home Medications   Current Outpatient Rx  Name Route Sig Dispense Refill  . ACETAMINOPHEN 500 MG PO TABS Oral Take 1,000 mg by mouth every 6 (six) hours as needed. For pain.    . IBUPROFEN 200 MG PO TABS Oral Take 400 mg by mouth every 8 (eight) hours as needed. For pain.      BP 156/89  Pulse 93  Temp(Src) 98.1 F (36.7 C) (Oral)  Resp 18  SpO2 98%  LMP 07/15/2011  Physical Exam  Nursing note and vitals reviewed. Constitutional: She is oriented to person, place, and time. She appears well-developed and well-nourished.       Morbidly obese  HENT:  Head: Normocephalic and atraumatic.  Eyes: Conjunctivae and EOM are normal. Pupils are equal, round, and reactive to light.  Neck: Normal range of motion. Neck supple.    Cardiovascular: Normal rate and regular rhythm.   Pulmonary/Chest: Effort normal and breath sounds normal.  Abdominal: Soft. Bowel sounds are normal.  Musculoskeletal: Normal range of motion.  Neurological: She is alert and oriented to person, place, and time.  Skin:       Left lower extremity: Erythema circumferentially from mid tibia distally to foot consistent with cellulitis  Psychiatric: She has a normal mood and affect.    ED Course  Procedures (including critical care time)  Labs Reviewed  BASIC METABOLIC PANEL - Abnormal; Notable for the following:    Sodium 130 (*)    Chloride 95 (*)    Glucose, Bld 305 (*)    All other components within normal limits  CBC  DIFFERENTIAL   No results found.   1. Cellulitis   2. Morbid obesity   3. Diabetes mellitus       MDM  Patient is morbidly obese, has type 2 diabetes, obvious cellulitis of left lower extremity. Rx IV vancomycin. Admit        Donnetta Hutching, MD 08/15/11 2140

## 2011-08-15 NOTE — ED Notes (Signed)
Report called to Jay, RN.

## 2011-08-15 NOTE — ED Notes (Addendum)
Edema of both lower extremities but more significant in the left lower leg with redness involving over one half of the lower leg---redness and pain began about three days ago--She does have a history for a P.E.4-5 yrs ago which she states was 2nd to birth control pill.  She is on NO blood thinners.

## 2011-08-16 ENCOUNTER — Other Ambulatory Visit (HOSPITAL_COMMUNITY): Payer: Self-pay

## 2011-08-16 DIAGNOSIS — M7989 Other specified soft tissue disorders: Secondary | ICD-10-CM

## 2011-08-16 DIAGNOSIS — M79609 Pain in unspecified limb: Secondary | ICD-10-CM

## 2011-08-16 LAB — GLUCOSE, CAPILLARY
Glucose-Capillary: 316 mg/dL — ABNORMAL HIGH (ref 70–99)
Glucose-Capillary: 229 mg/dL — ABNORMAL HIGH (ref 70–99)

## 2011-08-16 LAB — BASIC METABOLIC PANEL
BUN: 14 mg/dL (ref 6–23)
Chloride: 94 mEq/L — ABNORMAL LOW (ref 96–112)
Creatinine, Ser: 0.54 mg/dL (ref 0.50–1.10)
Glucose, Bld: 326 mg/dL — ABNORMAL HIGH (ref 70–99)
Potassium: 4 mEq/L (ref 3.5–5.1)

## 2011-08-16 LAB — CBC
HCT: 39.4 % (ref 36.0–46.0)
Hemoglobin: 13.3 g/dL (ref 12.0–15.0)
MCHC: 33.8 g/dL (ref 30.0–36.0)
MCV: 86.6 fL (ref 78.0–100.0)
RDW: 12.5 % (ref 11.5–15.5)

## 2011-08-16 LAB — HEMOGLOBIN A1C: Mean Plasma Glucose: 278 mg/dL — ABNORMAL HIGH (ref <117)

## 2011-08-16 MED ORDER — ENOXAPARIN SODIUM 40 MG/0.4ML ~~LOC~~ SOLN
40.0000 mg | SUBCUTANEOUS | Status: DC
  Filled 2011-08-16: qty 0.4

## 2011-08-16 MED ORDER — SODIUM CHLORIDE 0.9 % IV SOLN
INTRAVENOUS | Status: DC
  Administered 2011-08-16: 02:00:00 via INTRAVENOUS

## 2011-08-16 MED ORDER — PIPERACILLIN-TAZOBACTAM 3.375 G IVPB
3.3750 g | Freq: Three times a day (TID) | INTRAVENOUS | Status: DC
  Administered 2011-08-16 (×2): 3.375 g via INTRAVENOUS
  Filled 2011-08-16 (×5): qty 50

## 2011-08-16 MED ORDER — PROMETHAZINE HCL 25 MG/ML IJ SOLN
12.5000 mg | Freq: Four times a day (QID) | INTRAMUSCULAR | Status: DC | PRN
  Administered 2011-08-16 – 2011-08-17 (×5): 12.5 mg via INTRAVENOUS
  Filled 2011-08-16 (×5): qty 1

## 2011-08-16 MED ORDER — VANCOMYCIN HCL IN DEXTROSE 1-5 GM/200ML-% IV SOLN
1000.0000 mg | Freq: Once | INTRAVENOUS | Status: AC
  Administered 2011-08-16: 1000 mg via INTRAVENOUS
  Filled 2011-08-16: qty 200

## 2011-08-16 MED ORDER — INSULIN GLARGINE 100 UNIT/ML ~~LOC~~ SOLN
15.0000 [IU] | Freq: Every day | SUBCUTANEOUS | Status: DC
  Filled 2011-08-16: qty 3

## 2011-08-16 MED ORDER — INSULIN ASPART PROT & ASPART (70-30 MIX) 100 UNIT/ML ~~LOC~~ SUSP
22.0000 [IU] | Freq: Two times a day (BID) | SUBCUTANEOUS | Status: DC
  Administered 2011-08-16 – 2011-08-17 (×2): 22 [IU] via SUBCUTANEOUS
  Filled 2011-08-16: qty 3

## 2011-08-16 MED ORDER — ACETAMINOPHEN 325 MG PO TABS
650.0000 mg | ORAL_TABLET | Freq: Four times a day (QID) | ORAL | Status: DC | PRN
  Administered 2011-08-17: 650 mg via ORAL
  Filled 2011-08-16: qty 2

## 2011-08-16 MED ORDER — ENOXAPARIN SODIUM 80 MG/0.8ML ~~LOC~~ SOLN
80.0000 mg | SUBCUTANEOUS | Status: DC
  Administered 2011-08-16 – 2011-08-17 (×2): 80 mg via SUBCUTANEOUS
  Filled 2011-08-16 (×3): qty 0.8

## 2011-08-16 MED ORDER — ACETAMINOPHEN 650 MG RE SUPP: 650.0000 mg | Freq: Four times a day (QID) | RECTAL | Status: DC | PRN

## 2011-08-16 MED ORDER — HYDROMORPHONE HCL PF 1 MG/ML IJ SOLN
0.5000 mg | INTRAMUSCULAR | Status: DC | PRN
  Administered 2011-08-16 – 2011-08-17 (×7): 1 mg via INTRAVENOUS
  Filled 2011-08-16 (×7): qty 1

## 2011-08-16 MED ORDER — VANCOMYCIN HCL 1000 MG IV SOLR
1500.0000 mg | Freq: Two times a day (BID) | INTRAVENOUS | Status: DC
  Administered 2011-08-16: 1500 mg via INTRAVENOUS
  Filled 2011-08-16 (×2): qty 1500

## 2011-08-16 MED ORDER — SULFAMETHOXAZOLE-TMP DS 800-160 MG PO TABS
1.0000 | ORAL_TABLET | Freq: Two times a day (BID) | ORAL | Status: DC
  Administered 2011-08-16 – 2011-08-17 (×3): 1 via ORAL
  Filled 2011-08-16 (×6): qty 1

## 2011-08-16 MED ORDER — INSULIN ASPART 100 UNIT/ML ~~LOC~~ SOLN
0.0000 [IU] | Freq: Three times a day (TID) | SUBCUTANEOUS | Status: DC
  Administered 2011-08-16: 15 [IU] via SUBCUTANEOUS
  Administered 2011-08-16: 11 [IU] via SUBCUTANEOUS
  Administered 2011-08-16: 15 [IU] via SUBCUTANEOUS
  Administered 2011-08-17: 11 [IU] via SUBCUTANEOUS
  Administered 2011-08-17: 7 [IU] via SUBCUTANEOUS
  Administered 2011-08-17: 11 [IU] via SUBCUTANEOUS
  Filled 2011-08-16: qty 3

## 2011-08-16 NOTE — Progress Notes (Signed)
Subjective: Feeling better today with her leg.  Still having some leg pain but improved.  No other specific complaints.  Objective: Vital signs in last 24 hours: Filed Vitals:   08/15/11 2353 08/16/11 0100 08/16/11 0627 08/16/11 1411  BP: 129/66 162/76 134/68 152/84  Pulse: 74 83 86 71  Temp: 98.6 F (37 C) 98.1 F (36.7 C) 98 F (36.7 C) 97.6 F (36.4 C)  TempSrc: Oral Oral Oral Oral  Resp: 20 20 18 20   Height:  5\' 8"  (1.727 m)    Weight:  188.8 kg (416 lb 3.7 oz)    SpO2: 96% 93% 95% 97%   Weight change:   Intake/Output Summary (Last 24 hours) at 08/16/11 1645 Last data filed at 08/16/11 1300  Gross per 24 hour  Intake    600 ml  Output      0 ml  Net    600 ml    Physical Exam: General: Awake, Oriented, No acute distress. HEENT: EOMI. Neck: Supple CV: S1 and S2 Lungs: Clear to ascultation bilaterally Abdomen: Soft, Nontender, Nondistended, +bowel sounds. Ext: Good pulses.  Erythema over her left lower leg especially in the posterior region still within the margins and improved from yesterday..  Lab Results:  Basename 08/16/11 0423 08/15/11 2010  NA 128* 130*  K 4.0 4.5  CL 94* 95*  CO2 23 23  GLUCOSE 326* 305*  BUN 14 13  CREATININE 0.54 0.51  CALCIUM 9.1 9.6  MG -- --  PHOS -- --   No results found for this basename: AST:2,ALT:2,ALKPHOS:2,BILITOT:2,PROT:2,ALBUMIN:2 in the last 72 hours No results found for this basename: LIPASE:2,AMYLASE:2 in the last 72 hours  Basename 08/16/11 0423 08/15/11 2010  WBC 6.2 7.9  NEUTROABS -- 5.1  HGB 13.3 14.7  HCT 39.4 41.3  MCV 86.6 85.9  PLT 194 191   No results found for this basename: CKTOTAL:3,CKMB:3,CKMBINDEX:3,TROPONINI:3 in the last 72 hours No components found with this basename: POCBNP:3 No results found for this basename: DDIMER:2 in the last 72 hours  Basename 08/15/11 2010  HGBA1C 11.3*   No results found for this basename: CHOL:2,HDL:2,LDLCALC:2,TRIG:2,CHOLHDL:2,LDLDIRECT:2 in the last 72  hours No results found for this basename: TSH,T4TOTAL,FREET3,T3FREE,THYROIDAB in the last 72 hours No results found for this basename: VITAMINB12:2,FOLATE:2,FERRITIN:2,TIBC:2,IRON:2,RETICCTPCT:2 in the last 72 hours  Micro Results: Recent Results (from the past 240 hour(s))  MRSA PCR SCREENING     Status: Normal   Collection Time   08/16/11 12:20 PM      Component Value Range Status Comment   MRSA by PCR NEGATIVE  NEGATIVE  Final     Studies/Results: No results found.  Medications: I have reviewed the patient's current medications. Scheduled Meds:   . enoxaparin (LOVENOX) injection  80 mg Subcutaneous Q24H  .  HYDROmorphone (DILAUDID) injection  1 mg Intravenous Once  . influenza  inactive virus vaccine  0.5 mL Intramuscular Tomorrow-1000  . insulin aspart  0-20 Units Subcutaneous TID WC  . insulin aspart protamine-insulin aspart  22 Units Subcutaneous BID WC  . promethazine  12.5 mg Intravenous Once  . sulfamethoxazole-trimethoprim  1 tablet Oral Q12H  . vancomycin  1,000 mg Intravenous To ER  . vancomycin  1,000 mg Intravenous Once  . DISCONTD: enoxaparin  40 mg Subcutaneous Q24H  . DISCONTD: insulin glargine  15 Units Subcutaneous QHS  . DISCONTD: ondansetron  4 mg Intravenous Once  . DISCONTD: piperacillin-tazobactam (ZOSYN)  IV  3.375 g Intravenous Q8H  . DISCONTD: sodium chloride  500 mL Intravenous Once  .  DISCONTD: vancomycin  1,500 mg Intravenous Q12H   Continuous Infusions:   . DISCONTD: sodium chloride 75 mL/hr at 08/16/11 0206   PRN Meds:.acetaminophen, acetaminophen, HYDROmorphone, promethazine  Assessment/Plan: 1. Left lower leg and foot cellulitis.  Patient was on vancomycin and Zosyn.  Antibiotics transitioned to Bactrim.  Antibiotics since January 22 of 2013.  Patient is overweight for the MRI scanner.  Lower extremity dopplers done of left leg was negative for DVT or superficial venous thromboembolism.  No cyst or abscess noted.  2. DM (diabetes mellitus),  type 2, uncontrolled with complications.  Hemoglobin A1c was 11.3.  Reports that she does not have the means to afford for insulin.  Discontinue Lantus and NovoLog and start the patient on NovoLog 70/30.  Further titration to be done as an outpatient.  3.  Morbid obesity.  Diet and exercise as outpatient.  4.  Prophylaxis.  Lovenox.  5.  Disposition.  Pending.   LOS: 1 day  Melquisedec Journey A, MD 08/16/2011, 4:45 PM

## 2011-08-16 NOTE — Progress Notes (Signed)
Left lower extremity venous duplex completed.  Preliminary report is negative for DVT, SVT, or a Baker's cyst in the left leg.  Negative for DVT in the right common femoral vein. Jefferson Fuel, RVT

## 2011-08-16 NOTE — Progress Notes (Signed)
ANTIBIOTIC CONSULT NOTE - INITIAL  Pharmacy Consult for Vancomycin and Zosyn and lovenox Indication: cellulitis, dvt prophylaxis   Allergies  Allergen Reactions  . Zofran Itching and Nausea And Vomiting  . Doxycycline     Patient Measurements: Height: 5\' 8"  (172.7 cm) (Simultaneous filing. User may not have seen previous data.) Weight: 416 lb 3.7 oz (188.8 kg) (Simultaneous filing. User may not have seen previous data.) IBW/kg (Calculated) : 63.9  Adjusted Body Weight:   Vital Signs: Temp: 98.1 F (36.7 C) (01/23 0100) Temp src: Oral (01/23 0100) BP: 162/76 mmHg (01/23 0100) Pulse Rate: 83  (01/23 0100) Intake/Output from previous day:   Intake/Output from this shift:    Labs:  [facility] 08/15/11 2010  WBC 7.9  HGB 14.7  PLT 191  LABCREA --  CREATININE 0.51   Estimated Creatinine Clearance: 178.2 ml/min (by C-G formula based on Cr of 0.51). No results found for this basename: VANCOTROUGH:2,VANCOPEAK:2,VANCORANDOM:2,GENTTROUGH:2,GENTPEAK:2,GENTRANDOM:2,TOBRATROUGH:2,TOBRAPEAK:2,TOBRARND:2,AMIKACINPEAK:2,AMIKACINTROU:2,AMIKACIN:2, in the last 72 hours   Microbiology: No results found for this or any previous visit (from the past 720 hour(s)).  Medical History: Past Medical History  Diagnosis Date  . Cellulitis   . Diabetes mellitus   . PE (pulmonary embolism)     Medications:  Anti-infectives     Start     Dose/Rate Route Frequency Ordered Stop   08/16/11 1200   vancomycin (VANCOCIN) 1,500 mg in sodium chloride 0.9 % 500 mL IVPB        1,500 mg 250 mL/hr over 120 Minutes Intravenous Every 12 hours 08/16/11 0129     08/16/11 0130   vancomycin (VANCOCIN) IVPB 1000 mg/200 mL premix        1,000 mg 200 mL/hr over 60 Minutes Intravenous  Once 08/16/11 0128     08/16/11 0130   piperacillin-tazobactam (ZOSYN) IVPB 3.375 g        3.375 g 12.5 mL/hr over 240 Minutes Intravenous 3 times per day 08/16/11 0128     08/15/11 2000   vancomycin (VANCOCIN) IVPB 1000  mg/200 mL premix        1,000 mg 200 mL/hr over 60 Minutes Intravenous To Emergency Dept 08/15/11 1952 08/15/11 2230         Assessment: Patient with cellulits and obesity.  First dose of antibiotics already given in ED.  Goal of Therapy:  Vancomycin trough level 10-15 mcg/ml Zosyn based on renal function  Lovenox based on weight/renal function  Plan:  Measure antibiotic drug levels at steady state Follow up culture results Vancomycin 1gm iv x1, then 1500mg  iv q12hr Lovenox 80mg  sq q24hr Zosyn 3.375g IV Q8H infused over 4hrs.   Darlina Guys, Jacquenette Shone Crowford 08/16/2011,1:57 AM

## 2011-08-17 LAB — GLUCOSE, CAPILLARY: Glucose-Capillary: 272 mg/dL — ABNORMAL HIGH (ref 70–99)

## 2011-08-17 MED ORDER — UNABLE TO FIND: Status: DC

## 2011-08-17 MED ORDER — ACETAMINOPHEN 325 MG PO TABS: 650.0000 mg | ORAL_TABLET | Freq: Four times a day (QID) | ORAL | Status: DC | PRN

## 2011-08-17 MED ORDER — HYDROCODONE-ACETAMINOPHEN 5-325 MG PO TABS: 1.0000 | ORAL_TABLET | Freq: Four times a day (QID) | ORAL | Status: AC | PRN

## 2011-08-17 MED ORDER — SULFAMETHOXAZOLE-TMP DS 800-160 MG PO TABS: 1.0000 | ORAL_TABLET | Freq: Two times a day (BID) | ORAL | Status: AC

## 2011-08-17 MED ORDER — INSULIN ASPART PROT & ASPART (70-30 MIX) 100 UNIT/ML ~~LOC~~ SUSP
30.0000 [IU] | Freq: Two times a day (BID) | SUBCUTANEOUS | Status: DC
  Administered 2011-08-17: 30 [IU] via SUBCUTANEOUS
  Filled 2011-08-17: qty 3

## 2011-08-17 MED ORDER — INSULIN ASPART PROT & ASPART (70-30 MIX) 100 UNIT/ML ~~LOC~~ SUSP: 30.0000 [IU] | Freq: Two times a day (BID) | SUBCUTANEOUS | Status: DC

## 2011-08-17 MED ORDER — SULFAMETHOXAZOLE-TMP DS 800-160 MG PO TABS: 1.0000 | ORAL_TABLET | Freq: Two times a day (BID) | ORAL | Status: DC

## 2011-08-17 NOTE — Progress Notes (Signed)
Subjective: Feeling better today with her leg.  Leg not as tight.  No other specific complaints.  Objective: Vital signs in last 24 hours: Filed Vitals:   08/16/11 0627 08/16/11 1411 08/16/11 2116 08/17/11 0605  BP: 134/68 152/84 137/84 137/81  Pulse: 86 71 66 80  Temp: 98 F (36.7 C) 97.6 F (36.4 C) 98 F (36.7 C) 98.3 F (36.8 C)  TempSrc: Oral Oral Oral Oral  Resp: 18 20 20 20   Height:      Weight:      SpO2: 95% 97% 97% 97%   Weight change:   Intake/Output Summary (Last 24 hours) at 08/17/11 1331 Last data filed at 08/17/11 0900  Gross per 24 hour  Intake    240 ml  Output    600 ml  Net   -360 ml    Physical Exam: General: Awake, Oriented, No acute distress. HEENT: EOMI. Neck: Supple CV: S1 and S2 Lungs: Clear to ascultation bilaterally Abdomen: Soft, Nontender, Nondistended, +bowel sounds. Ext: Good pulses.  Erythema over her left lower leg especially in the posterior region still within the margins and improved from yesterday..  Lab Results:  Basename 08/16/11 0423 08/15/11 2010  NA 128* 130*  K 4.0 4.5  CL 94* 95*  CO2 23 23  GLUCOSE 326* 305*  BUN 14 13  CREATININE 0.54 0.51  CALCIUM 9.1 9.6  MG -- --  PHOS -- --   No results found for this basename: AST:2,ALT:2,ALKPHOS:2,BILITOT:2,PROT:2,ALBUMIN:2 in the last 72 hours No results found for this basename: LIPASE:2,AMYLASE:2 in the last 72 hours  Basename 08/16/11 0423 08/15/11 2010  WBC 6.2 7.9  NEUTROABS -- 5.1  HGB 13.3 14.7  HCT 39.4 41.3  MCV 86.6 85.9  PLT 194 191   No results found for this basename: CKTOTAL:3,CKMB:3,CKMBINDEX:3,TROPONINI:3 in the last 72 hours No components found with this basename: POCBNP:3 No results found for this basename: DDIMER:2 in the last 72 hours  Basename 08/15/11 2010  HGBA1C 11.3*   No results found for this basename: CHOL:2,HDL:2,LDLCALC:2,TRIG:2,CHOLHDL:2,LDLDIRECT:2 in the last 72 hours No results found for this basename:  TSH,T4TOTAL,FREET3,T3FREE,THYROIDAB in the last 72 hours No results found for this basename: VITAMINB12:2,FOLATE:2,FERRITIN:2,TIBC:2,IRON:2,RETICCTPCT:2 in the last 72 hours  Micro Results: Recent Results (from the past 240 hour(s))  MRSA PCR SCREENING     Status: Normal   Collection Time   08/16/11 12:20 PM      Component Value Range Status Comment   MRSA by PCR NEGATIVE  NEGATIVE  Final     Studies/Results: No results found.  Medications: I have reviewed the patient's current medications. Scheduled Meds:    . enoxaparin (LOVENOX) injection  80 mg Subcutaneous Q24H  . insulin aspart  0-20 Units Subcutaneous TID WC  . insulin aspart protamine-insulin aspart  22 Units Subcutaneous BID WC  . sulfamethoxazole-trimethoprim  1 tablet Oral Q12H   Continuous Infusions:  PRN Meds:.acetaminophen, acetaminophen, HYDROmorphone, promethazine  Assessment/Plan: 1. Left lower leg and foot cellulitis.  Patient was on vancomycin and Zosyn.  Antibiotics transitioned to Bactrim.  Antibiotics since January 22 of 2013.  Patient is overweight for the MRI scanner.  Lower extremity dopplers done of left leg was negative for DVT or superficial venous thromboembolism.  No cyst or abscess noted. Continue bactrim at discharge.  2. DM (diabetes mellitus), type 2, uncontrolled with complications.  Hemoglobin A1c was 11.3.  Reports that she does not have the means to afford for insulin.  Continue NovoLog 70/30 further titration to be done as outpatient.  3.  Morbid obesity.  Diet and exercise as outpatient.  4.  Prophylaxis.  Lovenox.  5.  Disposition. Discharge patient home today.   LOS: 2 days  Simran Mannis A, MD 08/17/2011, 1:31 PM

## 2011-08-17 NOTE — Progress Notes (Signed)
Information about [employer], [employer] and [employer] transportation given to pt.  Encouraged pt to keep appointment with [facility]. mp

## 2011-08-17 NOTE — Discharge Summary (Signed)
Discharge Summary  Karen Bennett MR#: [id number]  DOB:October 13, 1975  Date of Admission: 08/15/2011 Date of Discharge: 08/17/2011  Patient's PCP: No primary provider on file., [facility].  Attending Physician:Brinnley Lacap A  Consults: None.  Discharge Diagnoses: Active Problems:  DM (diabetes mellitus), type 2, uncontrolled with complications  Cellulitis  Obesity, Class III, BMI 40-49.9 (morbid obesity)  Brief Admitting History and Physical 36 year old Caucasian female with history of type 2 diabetes who presented with left lower leg swelling, redness and pain.  Discharge Medications Medication List  As of 08/17/2011  1:42 PM   STOP taking these medications         ibuprofen 200 MG tablet         TAKE these medications         acetaminophen 325 MG tablet   Commonly known as: TYLENOL   Take 2 tablets (650 mg total) by mouth every 6 (six) hours as needed (or Fever >/= 101).      HYDROcodone-acetaminophen 5-325 MG per tablet   Commonly known as: NORCO   Take 1 tablet by mouth every 6 (six) hours as needed for pain.      insulin aspart protamine-insulin aspart (70-30) 100 UNIT/ML injection   Commonly known as: NOVOLOG 70/30   Inject 30 Units into the skin 2 (two) times daily with a meal.      sulfamethoxazole-trimethoprim 800-160 MG per tablet   Commonly known as: BACTRIM DS   Take 1 tablet by mouth every 12 (twelve) hours.      UNABLE TO FIND   Insulin 60 syringes and 60 needles. Including refill, supply for 2 months.            Hospital Course: 1. Left lower leg and foot cellulitis. Patient was initially started on vancomycin and Zosyn. Antibiotics transitioned to Bactrim on January 23 of 2013. Antibiotics since January 22 of 2013.  Initially planned for MRI of the leg however the patient was over the weight limit for the scanner.  As a result, lower extremity dopplers done of left leg was negative for DVT or superficial venous  thromboembolism. No cyst or abscess noted. Continue bactrim at discharge for 12 more days to complete a two-week course.  Instructed patient that if redness worsened or if erythema extends up to the leg she is to come back to the emergency department for further evaluation.  2. DM (diabetes mellitus), type 2, uncontrolled with complications. Hemoglobin A1c was 11.3. Reports that she does not have the means to afford for insulin, case manager spoke with the patient and offered the patient medication assistance.  Started the patient on NovoLog 70/30 further titration to be done as outpatient.   3. Morbid obesity. Diet and exercise as outpatient.   Day of Discharge BP 137/81  Pulse 80  Temp(Src) 98.3 F (36.8 C) (Oral)  Resp 20  Ht 5\' 8"  (1.727 m)  Wt 188.8 kg (416 lb 3.7 oz)  BMI 63.29 kg/m2  SpO2 97%  LMP 07/15/2011  Results for orders placed during the hospital encounter of 08/15/11 (from the past 48 hour(s))  CBC     Status: Normal   Collection Time   08/15/11  8:10 PM      Component Value Range Comment   WBC 7.9  4.0 - 10.5 (K/uL)    RBC 4.81  3.87 - 5.11 (MIL/uL)    Hemoglobin 14.7  12.0 - 15.0 (g/dL)    HCT 40.9  81.1 - 91.4 (%)  MCV 85.9  78.0 - 100.0 (fL)    MCH 30.6  26.0 - 34.0 (pg)    MCHC 35.6  30.0 - 36.0 (g/dL)    RDW 82.9  56.2 - 13.0 (%)    Platelets 191  150 - 400 (K/uL)   DIFFERENTIAL     Status: Normal   Collection Time   08/15/11  8:10 PM      Component Value Range Comment   Neutrophils Relative 65  43 - 77 (%)    Neutro Abs 5.1  1.7 - 7.7 (K/uL)    Lymphocytes Relative 26  12 - 46 (%)    Lymphs Abs 2.1  0.7 - 4.0 (K/uL)    Monocytes Relative 8  3 - 12 (%)    Monocytes Absolute 0.6  0.1 - 1.0 (K/uL)    Eosinophils Relative 1  0 - 5 (%)    Eosinophils Absolute 0.1  0.0 - 0.7 (K/uL)    Basophils Relative 0  0 - 1 (%)    Basophils Absolute 0.0  0.0 - 0.1 (K/uL)   BASIC METABOLIC PANEL     Status: Abnormal   Collection Time   08/15/11  8:10 PM       Component Value Range Comment   Sodium 130 (*) 135 - 145 (mEq/L)    Potassium 4.5  3.5 - 5.1 (mEq/L) SLIGHT HEMOLYSIS   Chloride 95 (*) 96 - 112 (mEq/L)    CO2 23  19 - 32 (mEq/L)    Glucose, Bld 305 (*) 70 - 99 (mg/dL)    BUN 13  6 - 23 (mg/dL)    Creatinine, Ser 8.65  0.50 - 1.10 (mg/dL)    Calcium 9.6  8.4 - 10.5 (mg/dL)    GFR calc non Af Amer >90  >90 (mL/min)    GFR calc Af Amer >90  >90 (mL/min)   HEMOGLOBIN A1C     Status: Abnormal   Collection Time   08/15/11  8:10 PM      Component Value Range Comment   Hemoglobin A1C 11.3 (*) <5.7 (%)    Mean Plasma Glucose 278 (*) <117 (mg/dL)   CBC     Status: Normal   Collection Time   08/16/11  4:23 AM      Component Value Range Comment   WBC 6.2  4.0 - 10.5 (K/uL)    RBC 4.55  3.87 - 5.11 (MIL/uL)    Hemoglobin 13.3  12.0 - 15.0 (g/dL)    HCT 78.4  69.6 - 29.5 (%)    MCV 86.6  78.0 - 100.0 (fL)    MCH 29.2  26.0 - 34.0 (pg)    MCHC 33.8  30.0 - 36.0 (g/dL)    RDW 28.4  13.2 - 44.0 (%)    Platelets 194  150 - 400 (K/uL)   BASIC METABOLIC PANEL     Status: Abnormal   Collection Time   08/16/11  4:23 AM      Component Value Range Comment   Sodium 128 (*) 135 - 145 (mEq/L)    Potassium 4.0  3.5 - 5.1 (mEq/L)    Chloride 94 (*) 96 - 112 (mEq/L)    CO2 23  19 - 32 (mEq/L)    Glucose, Bld 326 (*) 70 - 99 (mg/dL)    BUN 14  6 - 23 (mg/dL)    Creatinine, Ser 1.02  0.50 - 1.10 (mg/dL)    Calcium 9.1  8.4 - 10.5 (mg/dL)    GFR calc  non Af Amer >90  >90 (mL/min)    GFR calc Af Amer >90  >90 (mL/min)   GLUCOSE, CAPILLARY     Status: Abnormal   Collection Time   08/16/11  8:39 AM      Component Value Range Comment   Glucose-Capillary 336 (*) 70 - 99 (mg/dL)   GLUCOSE, CAPILLARY     Status: Abnormal   Collection Time   08/16/11 11:43 AM      Component Value Range Comment   Glucose-Capillary 316 (*) 70 - 99 (mg/dL)    Comment 1 Notify RN      Comment 2 Documented in Chart     MRSA PCR SCREENING     Status: Normal   Collection Time     08/16/11 12:20 PM      Component Value Range Comment   MRSA by PCR NEGATIVE  NEGATIVE    GLUCOSE, CAPILLARY     Status: Abnormal   Collection Time   08/16/11  4:51 PM      Component Value Range Comment   Glucose-Capillary 266 (*) 70 - 99 (mg/dL)    Comment 1 Notify RN      Comment 2 Documented in Chart     GLUCOSE, CAPILLARY     Status: Abnormal   Collection Time   08/16/11 10:39 PM      Component Value Range Comment   Glucose-Capillary 229 (*) 70 - 99 (mg/dL)    Comment 1 Notify RN     GLUCOSE, CAPILLARY     Status: Abnormal   Collection Time   08/17/11 10:07 AM      Component Value Range Comment   Glucose-Capillary 272 (*) 70 - 99 (mg/dL)    Comment 1 Notify RN      Comment 2 Documented in Chart     GLUCOSE, CAPILLARY     Status: Abnormal   Collection Time   08/17/11 12:10 PM      Component Value Range Comment   Glucose-Capillary 294 (*) 70 - 99 (mg/dL)    Comment 1 Notify RN      Comment 2 Documented in Chart       No results found.  Disposition: Home  Diet: Diabetic diet  Activity: Resume as tolerated   Follow-up Appts: Discharge Orders    Future Orders Please Complete By Expires   Diet Carb Modified      Increase activity slowly      Discharge instructions      Comments:   Followup with [facility] as arranged. If you have worsening symptoms in leg please come back to the ER.      TESTS THAT NEED FOLLOW-UP None  Time spent on discharge, talking to the patient, and coordinating care: 25 mins.   Signed: Cristal Ford, MD 08/17/2011, 1:42 PM

## 2011-09-04 ENCOUNTER — Inpatient Hospital Stay (HOSPITAL_COMMUNITY)
Admission: EM | Admit: 2011-09-04 | Discharge: 2011-09-08 | DRG: 638 | Disposition: A | Discharge status: Home / Self Care | Payer: Self-pay | Attending: Internal Medicine | Admitting: Internal Medicine

## 2011-09-04 ENCOUNTER — Encounter (HOSPITAL_COMMUNITY): Payer: Self-pay | Admitting: Emergency Medicine

## 2011-09-04 DIAGNOSIS — Z91199 Patient's noncompliance with other medical treatment and regimen due to unspecified reason: Secondary | ICD-10-CM

## 2011-09-04 DIAGNOSIS — E876 Hypokalemia: Secondary | ICD-10-CM | POA: Diagnosis present

## 2011-09-04 DIAGNOSIS — Z9119 Patient's noncompliance with other medical treatment and regimen: Secondary | ICD-10-CM

## 2011-09-04 DIAGNOSIS — Z794 Long term (current) use of insulin: Secondary | ICD-10-CM | POA: Diagnosis present

## 2011-09-04 DIAGNOSIS — L97509 Non-pressure chronic ulcer of other part of unspecified foot with unspecified severity: Secondary | ICD-10-CM

## 2011-09-04 DIAGNOSIS — N179 Acute kidney failure, unspecified: Secondary | ICD-10-CM | POA: Diagnosis present

## 2011-09-04 DIAGNOSIS — S98139A Complete traumatic amputation of one unspecified lesser toe, initial encounter: Secondary | ICD-10-CM

## 2011-09-04 DIAGNOSIS — N390 Urinary tract infection, site not specified: Secondary | ICD-10-CM | POA: Diagnosis present

## 2011-09-04 DIAGNOSIS — E872 Acidosis, unspecified: Secondary | ICD-10-CM | POA: Diagnosis present

## 2011-09-04 DIAGNOSIS — IMO0002 Reserved for concepts with insufficient information to code with codable children: Principal | ICD-10-CM | POA: Diagnosis present

## 2011-09-04 DIAGNOSIS — Z86711 Personal history of pulmonary embolism: Secondary | ICD-10-CM

## 2011-09-04 DIAGNOSIS — E119 Type 2 diabetes mellitus without complications: Secondary | ICD-10-CM | POA: Diagnosis present

## 2011-09-04 DIAGNOSIS — E118 Type 2 diabetes mellitus with unspecified complications: Secondary | ICD-10-CM | POA: Diagnosis present

## 2011-09-04 DIAGNOSIS — Z6841 Body Mass Index (BMI) 40.0 and over, adult: Secondary | ICD-10-CM

## 2011-09-04 DIAGNOSIS — L97409 Non-pressure chronic ulcer of unspecified heel and midfoot with unspecified severity: Secondary | ICD-10-CM | POA: Diagnosis present

## 2011-09-04 DIAGNOSIS — E1165 Type 2 diabetes mellitus with hyperglycemia: Principal | ICD-10-CM | POA: Diagnosis present

## 2011-09-04 DIAGNOSIS — E785 Hyperlipidemia, unspecified: Secondary | ICD-10-CM | POA: Diagnosis present

## 2011-09-04 DIAGNOSIS — E871 Hypo-osmolality and hyponatremia: Secondary | ICD-10-CM | POA: Diagnosis present

## 2011-09-04 LAB — GLUCOSE, CAPILLARY: Glucose-Capillary: 290 mg/dL — ABNORMAL HIGH (ref 70–99)

## 2011-09-04 NOTE — ED Notes (Signed)
Pt states she has nausea and vomiting that started Saturday night  Pt states she also has an abscess on the bottom of her left foot  Pt is diabetic  Pt states she just feels real sick, stiff all over, and dizziness

## 2011-09-05 ENCOUNTER — Inpatient Hospital Stay (HOSPITAL_COMMUNITY): Payer: Self-pay

## 2011-09-05 ENCOUNTER — Emergency Department (HOSPITAL_COMMUNITY): Payer: Self-pay

## 2011-09-05 ENCOUNTER — Encounter (HOSPITAL_COMMUNITY): Payer: Self-pay | Admitting: Family Medicine

## 2011-09-05 DIAGNOSIS — L97509 Non-pressure chronic ulcer of other part of unspecified foot with unspecified severity: Secondary | ICD-10-CM

## 2011-09-05 DIAGNOSIS — E11621 Type 2 diabetes mellitus with foot ulcer: Secondary | ICD-10-CM | POA: Diagnosis present

## 2011-09-05 DIAGNOSIS — E871 Hypo-osmolality and hyponatremia: Secondary | ICD-10-CM | POA: Diagnosis present

## 2011-09-05 LAB — URINALYSIS, ROUTINE W REFLEX MICROSCOPIC
Protein, ur: 100 mg/dL — AB
Urobilinogen, UA: 0.2 mg/dL (ref 0.0–1.0)

## 2011-09-05 LAB — CBC
HCT: 42 % (ref 36.0–46.0)
Hemoglobin: 16.2 g/dL — ABNORMAL HIGH (ref 12.0–15.0)
Hemoglobin: 14.9 g/dL (ref 12.0–15.0)
MCH: 30.4 pg (ref 26.0–34.0)
MCHC: 36.7 g/dL — ABNORMAL HIGH (ref 30.0–36.0)
MCHC: 35.5 g/dL (ref 30.0–36.0)
MCV: 85.7 fL (ref 78.0–100.0)
RBC: 5.21 MIL/uL — ABNORMAL HIGH (ref 3.87–5.11)
RBC: 4.9 MIL/uL (ref 3.87–5.11)

## 2011-09-05 LAB — GLUCOSE, CAPILLARY
Glucose-Capillary: 281 mg/dL — ABNORMAL HIGH (ref 70–99)
Glucose-Capillary: 281 mg/dL — ABNORMAL HIGH (ref 70–99)

## 2011-09-05 LAB — LIPASE, BLOOD: Lipase: 20 U/L (ref 11–59)

## 2011-09-05 LAB — DIFFERENTIAL
Basophils Relative: 0 % (ref 0–1)
Lymphocytes Relative: 15 % (ref 12–46)
Lymphs Abs: 1.3 10*3/uL (ref 0.7–4.0)
Monocytes Relative: 8 % (ref 3–12)
Neutro Abs: 6.8 10*3/uL (ref 1.7–7.7)

## 2011-09-05 LAB — MRSA PCR SCREENING: MRSA by PCR: NEGATIVE

## 2011-09-05 LAB — HEMOGLOBIN A1C: Hgb A1c MFr Bld: 10.1 % — ABNORMAL HIGH (ref <5.7)

## 2011-09-05 LAB — COMPREHENSIVE METABOLIC PANEL
Alkaline Phosphatase: 63 U/L (ref 39–117)
BUN: 13 mg/dL (ref 6–23)
Chloride: 88 mEq/L — ABNORMAL LOW (ref 96–112)
GFR calc Af Amer: 53 mL/min — ABNORMAL LOW (ref >90)
GFR calc non Af Amer: 46 mL/min — ABNORMAL LOW (ref >90)
Glucose, Bld: 246 mg/dL — ABNORMAL HIGH (ref 70–99)
Potassium: 3.2 mEq/L — ABNORMAL LOW (ref 3.5–5.1)
Total Bilirubin: 0.9 mg/dL (ref 0.3–1.2)

## 2011-09-05 LAB — URINE MICROSCOPIC-ADD ON

## 2011-09-05 LAB — PROCALCITONIN: Procalcitonin: 4.09 ng/mL

## 2011-09-05 MED ORDER — INSULIN ASPART 100 UNIT/ML ~~LOC~~ SOLN
0.0000 [IU] | Freq: Three times a day (TID) | SUBCUTANEOUS | Status: DC
  Administered 2011-09-05 (×3): 8 [IU] via SUBCUTANEOUS
  Administered 2011-09-05: 4 [IU] via SUBCUTANEOUS
  Administered 2011-09-06: 11 [IU] via SUBCUTANEOUS
  Administered 2011-09-06: 8 [IU] via SUBCUTANEOUS
  Administered 2011-09-06: 11 [IU] via SUBCUTANEOUS
  Administered 2011-09-07 (×3): 8 [IU] via SUBCUTANEOUS
  Administered 2011-09-08: 5 [IU] via SUBCUTANEOUS
  Administered 2011-09-08: 3 [IU] via SUBCUTANEOUS
  Filled 2011-09-05: qty 3

## 2011-09-05 MED ORDER — SODIUM CHLORIDE 0.9 % IV BOLUS (SEPSIS)
1000.0000 mL | Freq: Once | INTRAVENOUS | Status: AC
  Administered 2011-09-05: 1000 mL via INTRAVENOUS

## 2011-09-05 MED ORDER — INSULIN ASPART 100 UNIT/ML ~~LOC~~ SOLN
0.0000 [IU] | Freq: Every day | SUBCUTANEOUS | Status: DC
  Administered 2011-09-06: 2 [IU] via SUBCUTANEOUS
  Administered 2011-09-07: 3 [IU] via SUBCUTANEOUS

## 2011-09-05 MED ORDER — PNEUMOCOCCAL VAC POLYVALENT 25 MCG/0.5ML IJ INJ
0.5000 mL | INJECTION | INTRAMUSCULAR | Status: AC
  Administered 2011-09-06: 0.5 mL via INTRAMUSCULAR
  Filled 2011-09-05: qty 0.5

## 2011-09-05 MED ORDER — ACETAMINOPHEN 650 MG RE SUPP: 650.0000 mg | Freq: Four times a day (QID) | RECTAL | Status: DC | PRN

## 2011-09-05 MED ORDER — ACETAMINOPHEN 325 MG PO TABS
650.0000 mg | ORAL_TABLET | Freq: Four times a day (QID) | ORAL | Status: DC | PRN
Start: 2011-09-05 — End: 2011-09-08
  Administered 2011-09-05 – 2011-09-06 (×2): 650 mg via ORAL
  Filled 2011-09-05 (×2): qty 2

## 2011-09-05 MED ORDER — ENOXAPARIN SODIUM 40 MG/0.4ML ~~LOC~~ SOLN
40.0000 mg | SUBCUTANEOUS | Status: DC
  Filled 2011-09-05: qty 0.4

## 2011-09-05 MED ORDER — INSULIN GLARGINE 100 UNIT/ML ~~LOC~~ SOLN: 20.0000 [IU] | Freq: Every day | SUBCUTANEOUS | Status: DC

## 2011-09-05 MED ORDER — ENOXAPARIN SODIUM 100 MG/ML ~~LOC~~ SOLN
90.0000 mg | SUBCUTANEOUS | Status: DC
  Administered 2011-09-05 – 2011-09-08 (×4): 90 mg via SUBCUTANEOUS
  Filled 2011-09-05 (×5): qty 1

## 2011-09-05 MED ORDER — PROMETHAZINE HCL 25 MG/ML IJ SOLN
12.5000 mg | Freq: Four times a day (QID) | INTRAMUSCULAR | Status: DC | PRN
  Administered 2011-09-05 – 2011-09-08 (×11): 12.5 mg via INTRAVENOUS
  Filled 2011-09-05 (×11): qty 1

## 2011-09-05 MED ORDER — MORPHINE SULFATE 4 MG/ML IJ SOLN
4.0000 mg | Freq: Once | INTRAMUSCULAR | Status: AC
  Administered 2011-09-05: 4 mg via INTRAVENOUS
  Filled 2011-09-05: qty 1

## 2011-09-05 MED ORDER — POTASSIUM CHLORIDE IN NACL 20-0.9 MEQ/L-% IV SOLN
INTRAVENOUS | Status: DC
  Administered 2011-09-05: 09:00:00 via INTRAVENOUS
  Administered 2011-09-07: 1000 mL via INTRAVENOUS
  Filled 2011-09-05 (×6): qty 1000

## 2011-09-05 MED ORDER — ACETAMINOPHEN 500 MG PO TABS
1000.0000 mg | ORAL_TABLET | Freq: Once | ORAL | Status: AC
Start: 2011-09-05 — End: 2011-09-05
  Administered 2011-09-05: 1000 mg via ORAL
  Filled 2011-09-05: qty 2

## 2011-09-05 MED ORDER — PIPERACILLIN SOD-TAZOBACTAM SO 2.25 (2-0.25) G IV SOLR
3.3750 g | Freq: Once | INTRAVENOUS | Status: AC
  Administered 2011-09-05: 3.375 g via INTRAVENOUS
  Filled 2011-09-05 (×2): qty 3.38

## 2011-09-05 MED ORDER — SODIUM CHLORIDE 0.9 % IV SOLN
1500.0000 mg | Freq: Two times a day (BID) | INTRAVENOUS | Status: DC
  Administered 2011-09-05 – 2011-09-07 (×5): 1500 mg via INTRAVENOUS
  Filled 2011-09-05 (×8): qty 1500

## 2011-09-05 MED ORDER — MORPHINE SULFATE 2 MG/ML IJ SOLN
2.0000 mg | INTRAMUSCULAR | Status: DC | PRN
  Administered 2011-09-05 – 2011-09-08 (×14): 2 mg via INTRAVENOUS
  Filled 2011-09-05 (×15): qty 1

## 2011-09-05 MED ORDER — INSULIN GLARGINE 100 UNIT/ML ~~LOC~~ SOLN
10.0000 [IU] | Freq: Every day | SUBCUTANEOUS | Status: DC
  Administered 2011-09-05: 10 [IU] via SUBCUTANEOUS
  Filled 2011-09-05: qty 3

## 2011-09-05 MED ORDER — METOCLOPRAMIDE HCL 5 MG/ML IJ SOLN
10.0000 mg | Freq: Once | INTRAMUSCULAR | Status: AC
  Administered 2011-09-05: 10 mg via INTRAVENOUS
  Filled 2011-09-05: qty 2

## 2011-09-05 MED ORDER — VANCOMYCIN HCL IN DEXTROSE 1-5 GM/200ML-% IV SOLN
1000.0000 mg | Freq: Once | INTRAVENOUS | Status: AC
  Administered 2011-09-05: 1000 mg via INTRAVENOUS
  Filled 2011-09-05: qty 200

## 2011-09-05 MED ORDER — HYDROCODONE-ACETAMINOPHEN 5-325 MG PO TABS
1.0000 | ORAL_TABLET | ORAL | Status: DC | PRN
  Administered 2011-09-05 – 2011-09-06 (×2): 2 via ORAL
  Administered 2011-09-07 – 2011-09-08 (×2): 1 via ORAL
  Administered 2011-09-08 (×2): 2 via ORAL
  Filled 2011-09-05: qty 2
  Filled 2011-09-05: qty 1
  Filled 2011-09-05: qty 2
  Filled 2011-09-05: qty 1
  Filled 2011-09-05 (×3): qty 2

## 2011-09-05 MED ORDER — PIPERACILLIN-TAZOBACTAM 3.375 G IVPB
3.3750 g | Freq: Three times a day (TID) | INTRAVENOUS | Status: DC
  Administered 2011-09-05 – 2011-09-07 (×7): 3.375 g via INTRAVENOUS
  Filled 2011-09-05 (×9): qty 50

## 2011-09-05 NOTE — ED Provider Notes (Signed)
History     CSN: [id number]  Arrival date & time 09/04/11  Paulo Fruit   First MD Initiated Contact with Patient 09/05/11 0111      Chief Complaint  Patient presents with  . Emesis  . Abscess    (Consider location/radiation/quality/duration/timing/severity/associated sxs/prior treatment) HPI History provided by pt.   Pt presents w/ multiple complaints.  Has had N/V/D for the past three days.  No  Hematemesis/hematochezia/melena.  Not tolerating pos.  Has had pain in LLQ but only with sudden movements.  No GU sx.  No fever.  Also c/o painful wound to left foot.  Draining foul-smelling, purulent, bloody discharge.  Per prior chart, pt admitted for cellulitis of left foot and lower leg 1/22-1/24/13.  Pt reports that cellulitis resolved w/ abx but the blister that she had at that time has developed into an open wound.  Pt has untreated diabetes.    Past Medical History  Diagnosis Date  . Cellulitis   . Diabetes mellitus   . PE (pulmonary embolism)     Past Surgical History  Procedure Date  . 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 Surgery to remove hematoma in l leg   . Abscess removal from l groin     Family History  Problem Relation Age of Onset  . Diabetes Mother   . Coronary artery disease Mother     History  Substance Use Topics  . Smoking status: Never Smoker   . Smokeless tobacco: Never Used  . Alcohol Use: No    OB History    Grav Para Term Preterm Abortions TAB SAB Ect Mult Living                  Review of Systems  All other systems reviewed and are negative.    Allergies  Zofran and Doxycycline  Home Medications   Current Outpatient Rx  Name Route Sig Dispense Refill  . ACETAMINOPHEN 325 MG PO TABS Oral Take 2 tablets (650 mg total) by mouth every 6 (six) hours as needed (or Fever >/= 101).    . INSULIN ASPART PROT & ASPART (70-30) 100 UNIT/ML [place] SUSP Subcutaneous Inject 30 Units into the skin 2 (two) times daily with a meal. 30 mL 1    BP 110/48   Pulse 106  Temp(Src) 100.4 F (38 C) (Oral)  Resp 24  SpO2 98%  LMP 07/15/2011  Physical Exam  Nursing note and vitals reviewed. Constitutional: She is oriented to person, place, and time. She appears well-developed and well-nourished. No distress.       Morbidly obese  HENT:  Head: Normocephalic and atraumatic.  Eyes:       Normal appearance  Neck: Normal range of motion.  Cardiovascular: Normal rate and regular rhythm.   Pulmonary/Chest: Effort normal and breath sounds normal.  Abdominal: Soft. Bowel sounds are normal. She exhibits no distension and no mass. There is no tenderness. There is no rebound and no guarding.       No CVA tenderness  Musculoskeletal:       4th and 5th toes surgically absent.  2cm open wound on plantar surface at distal 5th metatarsal.  No drainage currently.  Narrow ring of surrounding erythema that appears inflammatory.  Ttp.    Neurological: She is alert and oriented to person, place, and time.  Skin: Skin is warm and dry. No rash noted.  Psychiatric: She has a normal mood and affect. Her behavior is normal.  ED Course  Procedures (including critical care time)  Labs Reviewed  GLUCOSE, CAPILLARY - Abnormal; Notable for the following:    Glucose-Capillary 290 (*)    All other components within normal limits  GLUCOSE, CAPILLARY - Abnormal; Notable for the following:    Glucose-Capillary 281 (*)    All other components within normal limits  CBC - Abnormal; Notable for the following:    RBC 5.21 (*)    Hemoglobin 16.2 (*)    MCHC 36.7 (*)    All other components within normal limits  COMPREHENSIVE METABOLIC PANEL - Abnormal; Notable for the following:    Sodium 123 (*)    Potassium 3.2 (*)    Chloride 88 (*)    CO2 18 (*)    Glucose, Bld 246 (*)    Creatinine, Ser 1.46 (*)    GFR calc non Af Amer 46 (*)    GFR calc Af Amer 53 (*)    All other components within normal limits  GLUCOSE, CAPILLARY - Abnormal; Notable for the following:     Glucose-Capillary 263 (*)    All other components within normal limits  DIFFERENTIAL  LIPASE, BLOOD  PREGNANCY, URINE  URINALYSIS, ROUTINE W REFLEX MICROSCOPIC  CULTURE, BLOOD (ROUTINE X 2)  CULTURE, BLOOD (ROUTINE X 2)   Dg Foot Complete Left  09/05/2011  *RADIOLOGY REPORT*  Clinical Data: Open wound along the fifth metatarsal.  LEFT FOOT - COMPLETE 3+ VIEW  Comparison: CT of the foot 03/14/2009.  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  Extensive edematous changes are noted along the lateral aspect of the foot.  There is a focal ulceration.  Gas does not extend to the level of the bone.  No osseous erosion is evident.  Moderate diffuse edematous changes are present.  IMPRESSION:  1.  Ulceration along the plantar and lateral aspect of the foot near the residual fifth metatarsal. 2.  The ulceration is not extend to the bone, nor is there any underlying osseous resorption. 3.  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  Original Report Authenticated By: Jamesetta Orleans. MATTERN, M.D.     1. Diabetes mellitus       MDM  36yo, morbidly obese F w/ untreated diabetes, presents w/ c/o N/V/D x 3 days as well as open wound to left foot.  Wound started as a on-traumatic blister; treated for associated cellulitis of left foot and lower leg in January.   Temp 100.2, abd benign and non-tender, open wound plantar surface of left foot w/out drainage or surrounding cellulitis on exam.  Xray left foot to look for obvious osteomyelitis.  BG 290 and rest of labs pending.  Pt to receive IV fluids, morphine, reglan and tylenol.   Pt is in mild DKA w/ bicarb of 18 and anion gap of 17.  A second NS bolus ordered.  Xray left foot shows that ulceration does not extend to the bone and no sign of osteomyelitis.  All results discussed w/ pt.  She reports that her pain and nausea are much improved.  Dr. Nicanor Alcon has consulted [facility] for admissions.          Otilio Miu, [place] 09/05/11 [phone]

## 2011-09-05 NOTE — H&P (Signed)
PCP:   None  Chief Complaint:  Diabetic foot ulcer  HPI: This is an unfortunate 36 year old female who was just admitted treated with cellulitis. She was discharged with Bactrim. She states in addition to the small close blister on the left foot, since discharge the blister has opened and become an ulcer. It is painful and draining. She reports she's had fevers. On Saturday she developed nausea vomiting and diarrhea, which she describes as severe no evidence of bleeding. This resolved by Sunday. She came to the ER today because of her diabetic foot ulcer. She has a history of diabetes mellitus, for which she is not medications. She states she's been off medications for approximately a year despite being diabetic. She does not have a glucometer to check her blood sugar. Her lack of medical compliance appears to be mostly due to lack of transportation.  Review of Systems: Positives bolded anorexia, fever, weight loss,, vision loss, decreased hearing, hoarseness, chest pain, syncope, dyspnea on exertion, peripheral edema, balance deficits, hemoptysis, abdominal pain, melena, hematochezia, severe indigestion/heartburn, hematuria, incontinence, genital sores, muscle weakness, suspicious skin lesions, transient blindness, difficulty walking, depression, unusual weight change, abnormal bleeding, enlarged lymph nodes, angioedema, and breast masses.  Past Medical History: Past Medical History  Diagnosis Date  . Cellulitis   . Diabetes mellitus   . PE (pulmonary embolism)    Past Surgical History  Procedure Date  . 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 Surgery to remove hematoma in l leg   . Abscess removal from l groin     Medications: Prior to Admission medications   Medication Sig Start Date End Date Taking? Authorizing Provider  acetaminophen (TYLENOL) 325 MG tablet Take 2 tablets (650 mg total) by mouth every 6 (six) hours as needed (or Fever >/= 101). 08/17/11 08/16/12 Yes Srikar Cherlynn Kaiser, MD   insulin aspart protamine-insulin aspart (NOVOLOG 70/30) (70-30) 100 UNIT/ML injection Inject 30 Units into the skin 2 (two) times daily with a meal. 08/17/11 08/16/12 Yes Cristal Ford, MD    Allergies:   Allergies  Allergen Reactions  . Zofran Itching and Nausea And Vomiting  . Doxycycline     Social History:  reports that she has never smoked. She has never used smokeless tobacco. She reports that she does not drink alcohol or use illicit drugs.  Family History: Family History  Problem Relation Age of Onset  . Diabetes Mother   . Coronary artery disease Mother     Physical Exam: Filed Vitals:   09/05/11 0113 09/05/11 0302 09/05/11 0425 09/05/11 0432  BP: 110/48 127/57    Pulse: 106 99    Temp: 100.4 F (38 C)   99 F (37.2 C)  TempSrc: Oral   Oral  Resp: 24 16    Height:   5\' 8"  (1.727 m)   Weight:   189.604 kg (418 lb)   SpO2: 98% 99%      General:  Alert and oriented times three, well developed and nourished, no acute distress Eyes: PERRLA, pink conjunctiva, no scleral icterus ENT: Moist oral mucosa, neck supple, no thyromegaly Lungs: clear to ascultation, no wheeze, no crackles, no use of accessory muscles Cardiovascular: regular rate and rhythm, no regurgitation, no gallops, no murmurs. No carotid bruits, no JVD Abdomen: soft, positive BS, non-tender, non-distended, no organomegaly, not an acute abdomen GU: not examined Neuro: CN II - XII grossly intact, sensation intact Musculoskeletal: strength 5/5 all extremities, no clubbing, cyanosis or edema, diabetic  foot ulcer left lateral foot, status post amputation toe Skin: no rash, no subcutaneous crepitation, no decubitus Psych: appropriate patient   Labs on Admission:   Basename 09/05/11 0120  NA 123*  K 3.2*  CL 88*  CO2 18*  GLUCOSE 246*  BUN 13  CREATININE 1.46*  CALCIUM 9.2  MG --  PHOS --    Basename 09/05/11 0120  AST 11  ALT 11  ALKPHOS 63  BILITOT 0.9  PROT 8.0  ALBUMIN 3.5     Basename 09/05/11 0120  LIPASE 20  AMYLASE --    Basename 09/05/11 0120  WBC 8.8  NEUTROABS 6.8  HGB 16.2*  HCT 44.2  MCV 84.8  PLT 167   No results found for this basename: CKTOTAL:3,CKMB:3,CKMBINDEX:3,TROPONINI:3 in the last 72 hours No components found with this basename: POCBNP:3 No results found for this basename: DDIMER:2 in the last 72 hours No results found for this basename: HGBA1C:2 in the last 72 hours No results found for this basename: CHOL:2,HDL:2,LDLCALC:2,TRIG:2,CHOLHDL:2,LDLDIRECT:2 in the last 72 hours No results found for this basename: TSH,T4TOTAL,FREET3,T3FREE,THYROIDAB in the last 72 hours No results found for this basename: VITAMINB12:2,FOLATE:2,FERRITIN:2,TIBC:2,IRON:2,RETICCTPCT:2 in the last 72 hours  Micro Results: No results found for this or any previous visit (from the past 240 hour(s)).   Radiological Exams on Admission: Dg Foot Complete Left  09/05/2011  *RADIOLOGY REPORT*  Clinical Data: Open wound along the fifth metatarsal.  LEFT FOOT - COMPLETE 3+ VIEW  Comparison: CT of the foot 03/14/2009.  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  Extensive edematous changes are noted along the lateral aspect of the foot.  There is a focal ulceration.  Gas does not extend to the level of the bone.  No osseous erosion is evident.  Moderate diffuse edematous changes are present.  IMPRESSION:  1.  Ulceration along the plantar and lateral aspect of the foot near the residual fifth metatarsal. 2.  The ulceration is not extend to the bone, nor is there any underlying osseous resorption. 3.  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  Original Report Authenticated By: Jamesetta Orleans. MATTERN, M.D.    Assessment/Plan Present on Admission:  .Hyponatremia Hypokalemia Likely due to patient's recent bout of gastroenteritis Urine sodium and osmolarity ordered, plasma osmolarity and TSH  IV fluid  hydration  .Diabetic foot ulcer Untreated diabetes mellitus Blood cultures and wound cultures ordered IV antibiotics Vanco and Zosyn ordered  Woundcare consulted  Ankle-brachial indexes ordered CT ordered for extremity rule out bony involvement Hemoglobin A1c ADA diet and sliding scale insulin Low dose Lantus Acute kidney injury/dehydration Gastroenteritis -resolved Metabolic acidosis IV fluid hydration ordered due to patient's recent bout of gastroenteritis .HYPERLIPIDEMIA .Obesity, Class III, BMI 40-49.9 (morbid obesity) History of diabetic foot ulcer than led to toe amputation   Full code and DVT prophylaxis Team 1/DR Oleh Genin, Millette Halberstam 09/05/2011, 4:37 AM

## 2011-09-05 NOTE — Progress Notes (Signed)
   Patient seen and examined, database reviewed.   Patient seen earlier today by my colleague Dr Joneen Roach.  DM 2 not taking her medications, cellulitis,acute on chronic hyponatremia and UTI.  Vancomycin and Zosyn, continue IV Hydration.  Clint Lipps Pager: [phone] 09/05/2011, 6:14 PM

## 2011-09-05 NOTE — Progress Notes (Signed)
ANTIBIOTIC CONSULT NOTE - INITIAL  Pharmacy Consult for Vancomyin/Zosyn Indication: Foot ulcer/diabetic  Allergies  Allergen Reactions  . Zofran Itching and Nausea And Vomiting  . Doxycycline     Patient Measurements: Height: 5\' 8"  (172.7 cm) Weight: 418 lb (189.604 kg) IBW/kg (Calculated) : 63.9  Adjusted Body Weight: 101 kg  Vital Signs: Temp: 98.4 F (36.9 C) (02/12 0719) Temp src: Oral (02/12 0719) BP: 109/67 mmHg (02/12 0719) Pulse Rate: 78  (02/12 0719) Intake/Output from previous day:   Intake/Output from this shift:    Labs:  Basename 09/05/11 0120  WBC 8.8  HGB 16.2*  PLT 167  LABCREA --  CREATININE 1.46*   Estimated Creatinine Clearance: 97.9 ml/min (by C-G formula based on Cr of 1.46). No results found for this basename: VANCOTROUGH:2,VANCOPEAK:2,VANCORANDOM:2,GENTTROUGH:2,GENTPEAK:2,GENTRANDOM:2,TOBRATROUGH:2,TOBRAPEAK:2,TOBRARND:2,AMIKACINPEAK:2,AMIKACINTROU:2,AMIKACIN:2, in the last 72 hours   Microbiology: Recent Results (from the past 720 hour(s))  MRSA PCR SCREENING     Status: Normal   Collection Time   08/16/11 12:20 PM      Component Value Range Status Comment   MRSA by PCR NEGATIVE  NEGATIVE  Final     Medical History: Past Medical History  Diagnosis Date  . Cellulitis   . Diabetes mellitus   . PE (pulmonary embolism)     Medications:  Anti-infectives     Start     Dose/Rate Route Frequency Ordered Stop   09/05/11 1200   piperacillin-tazobactam (ZOSYN) IVPB 3.375 g        3.375 g 12.5 mL/hr over 240 Minutes Intravenous Every 8 hours 09/05/11 0805     09/05/11 1000   vancomycin (VANCOCIN) 1,500 mg in sodium chloride 0.9 % 500 mL IVPB        1,500 mg 250 mL/hr over 120 Minutes Intravenous Every 12 hours 09/05/11 0805     09/05/11 0400   vancomycin (VANCOCIN) IVPB 1000 mg/200 mL premix        1,000 mg 200 mL/hr over 60 Minutes Intravenous  Once 09/05/11 0349 09/05/11 0704   09/05/11 0400   piperacillin-tazobactam (ZOSYN) 3.375 g  in dextrose 5 % 50 mL IVPB        3.375 g 100 mL/hr over 30 Minutes Intravenous  Once 09/05/11 0349           Assessment: -36 yo F with continued diabetic foot ulcer,was treated with Septra as outpatient PTA. Xray with no sign of osteomyelitis. -HcA1C pending, no medical tx of diabetes at home. -Received 1gm Vanc in ED, Zosyn 3.375 x1 -Pt obese, optimum loading dose of Vancomycin 2gm -will adjust Lovenox for TBW  Goal of Therapy:  Vancomycin trough level 15-20 mcg/ml  Plan:  Vancomycin 1500mg  q12 Zosyn 3.375gm q8hr Measure antibiotic drug levels at steady state Follow up culture results  Change Lovenox to 90mg  SQ q24hr for TBW 190kg  Otho Bellows PharmD Pager [phone] 8:14 AM

## 2011-09-05 NOTE — Progress Notes (Signed)
UR complete 

## 2011-09-05 NOTE — ED Notes (Signed)
MD at bedside. Dr. Daun Peacock at bedside.

## 2011-09-05 NOTE — ED Notes (Signed)
Patient transported to X-ray 

## 2011-09-05 NOTE — ED Provider Notes (Signed)
Medical screening examination/treatment/procedure(s) were conducted as a shared visit with non-physician practitioner(s) and myself.  I personally evaluated the patient during the encounter  PERRL MMM RRR CTAB NABS Wound on the left foot  Plan admit  Emanuella Nickle K Alonna Bartling-Rasch, MD 09/05/11 [phone]

## 2011-09-05 NOTE — Progress Notes (Signed)
Inpatient Diabetes Program Recommendations  AACE/ADA: New Consensus Statement on Inpatient Glycemic Control (2009)  Target Ranges:  Prepandial:   less than 140 mg/dL      Peak postprandial:   less than 180 mg/dL (1-2 hours)      Critically ill patients:  140 - 180 mg/dL   Reason for Visit: Hyperglycemia  Results for Karen Bennett, Karen Bennett (MRN [id number]) as of 09/05/2011 14:16  Ref. Range 09/04/2011 21:01 09/05/2011 01:18 09/05/2011 04:36 09/05/2011 07:56 09/05/2011 12:44  Glucose-Capillary Latest Range: 70-99 mg/dL 409 (H) 811 (H) 914 (H) 285 (H) 270 (H)    Inpatient Diabetes Program Recommendations Insulin - Basal: Recommend 70/30 25 units bid Correction (SSI): Increase Novolog to resisstant tidwc and hs  Note: Pt states she cannot afford insulin or meter to check blood sugars.  Does not have transportation for MD visits.  Has friend to bring groceries to her.  Will need social work consult to assist with financial concerns and plan of care after discharge in light of diabetes diagnosis.  Will follow.

## 2011-09-05 NOTE — ED Notes (Signed)
MD at bedside. PA, Santina Evans, at bedside.

## 2011-09-05 NOTE — Progress Notes (Signed)
VASCULAR LAB PRELIMINARY  PRELIMINARY  PRELIMINARY  PRELIMINARY  ARTERIAL  ABI completed: Bilateral ABIs indicate mild decrease in flow.  Waveforms are normal.  ABIs calculated with left brachial pressure are 0.89 which is borderline within normal limits.  Bilateral brachial pressures taken multiple times.      RIGHT    LEFT    PRESSURE WAVEFORM  PRESSURE WAVEFORM  BRACHIAL 171 tri BRACHIAL 154 tri  DP 138 tri DP 127 tri  PT 134 tri PT 137  tri  AT   AT    PER   PER    GREAT TOE   GREAT TOE      RIGHT LEFT  ABI 0.81 0.8     Terance Hart, RVT 09/05/2011, 2:24 PM

## 2011-09-05 NOTE — Consult Note (Signed)
WOC consult Note Reason for Consult: Consult requested for left outer foot wound.  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  This site recently developed a blister this week which ruptured and drained. And has evolved into full thickness wound at this time. Measurement: .2X.2X.2cm Bone palpable with swab.  Undermining at least .5cm, difficult to assess r/t pain when site probed. Wound bed: Red inner wound bed difficult to visualize r/t narrow opening. Drainage (amount, consistency, odor) Mod tan drainage, no odor Periwound: Erythremia and edema surrounding wound to 5 cm.  Suspect fluid is undermining and opening is too narrow to drain adequately. Dressing procedure/placement/frequency: Aquacel to provide antimicrobial benefits and absorb drainage.  CT scan results pending.  If no s/s improvement with vancomycin in next 48 hours, then pt could benefit from ortho consult.  Will not plan to follow further unless re-consulted.  [address], RN, MSN, [employer]  [phone]

## 2011-09-05 NOTE — ED Notes (Signed)
Blood glucose POC 263mg /dl.

## 2011-09-06 LAB — BASIC METABOLIC PANEL
BUN: 15 mg/dL (ref 6–23)
CO2: 19 mEq/L (ref 19–32)
Chloride: 96 mEq/L (ref 96–112)
GFR calc non Af Amer: 85 mL/min — ABNORMAL LOW (ref >90)
Glucose, Bld: 311 mg/dL — ABNORMAL HIGH (ref 70–99)
Potassium: 3.2 mEq/L — ABNORMAL LOW (ref 3.5–5.1)
Sodium: 128 mEq/L — ABNORMAL LOW (ref 135–145)

## 2011-09-06 LAB — CBC
HCT: 39.5 % (ref 36.0–46.0)
Hemoglobin: 14 g/dL (ref 12.0–15.0)
MCH: 30.2 pg (ref 26.0–34.0)
MCV: 85.3 fL (ref 78.0–100.0)
RBC: 4.63 MIL/uL (ref 3.87–5.11)

## 2011-09-06 LAB — URINE CULTURE: Colony Count: NO GROWTH

## 2011-09-06 LAB — GLUCOSE, CAPILLARY: Glucose-Capillary: 277 mg/dL — ABNORMAL HIGH (ref 70–99)

## 2011-09-06 MED ORDER — INSULIN GLARGINE 100 UNIT/ML ~~LOC~~ SOLN: 40.0000 [IU] | Freq: Every day | SUBCUTANEOUS | Status: DC

## 2011-09-06 MED ORDER — LIDOCAINE HCL 1 % IJ SOLN
30.0000 mL | Freq: Once | INTRAMUSCULAR | Status: AC
  Administered 2011-09-06: 30 mL via INTRADERMAL
  Filled 2011-09-06: qty 30

## 2011-09-06 MED ORDER — INSULIN GLARGINE 100 UNIT/ML ~~LOC~~ SOLN
40.0000 [IU] | Freq: Every day | SUBCUTANEOUS | Status: DC
  Administered 2011-09-06 – 2011-09-07 (×2): 40 [IU] via SUBCUTANEOUS

## 2011-09-06 NOTE — Progress Notes (Signed)
Subjective: Still with a lot of pain in L foot  Objective: Vital signs in last 24 hours: Temp:  [99.8 F (37.7 C)-100.7 F (38.2 C)] 99.8 F (37.7 C) (02/13 0506) Pulse Rate:  [87-104] 90  (02/13 0506) Resp:  [18-22] 19  (02/13 0506) BP: (95-159)/(68-80) 159/80 mmHg (02/13 0506) SpO2:  [91 %-96 %] 94 % (02/13 0506) Weight change:  Last BM Date: 09/05/11  Intake/Output from previous day: 02/12 0701 - 02/13 0700 In: 2716.3 [P.O.:1680; I.V.:486.3; IV Piggyback:550] Out: 650 [Urine:650]     Physical Exam: General: Alert, awake, oriented x3, in no acute distress. HEENT: No bruits, no goiter. Heart: Regular rate and rhythm, without murmurs, rubs, gallops. Lungs: Clear to auscultation bilaterally. Abdomen: Soft, minimaly tender in LUQ, nondistended, positive bowel sounds. Extremities: L foot with swelling over the lateral surface, very tender to palpation, opening about 1cm Neuro: Grossly intact, nonfocal.    Lab Results: Basic Metabolic Panel:  Basename 09/06/11 0024 09/05/11 0848 09/05/11 0120  NA 128* -- 123*  K 3.2* -- 3.2*  CL 96 -- 88*  CO2 19 -- 18*  GLUCOSE 311* -- 246*  BUN 15 -- 13  CREATININE 0.88 1.07 --  CALCIUM 8.6 -- 9.2  MG -- -- --  PHOS -- -- --   Liver Function Tests:  Basename 09/05/11 0120  AST 11  ALT 11  ALKPHOS 63  BILITOT 0.9  PROT 8.0  ALBUMIN 3.5    Basename 09/05/11 0120  LIPASE 20  AMYLASE --   No results found for this basename: AMMONIA:2 in the last 72 hours CBC:  Basename 09/06/11 0024 09/05/11 0848 09/05/11 0120  WBC 7.0 7.3 --  NEUTROABS -- -- 6.8  HGB 14.0 14.9 --  HCT 39.5 42.0 --  MCV 85.3 85.7 --  PLT 125* 139* --   Cardiac Enzymes: No results found for this basename: CKTOTAL:3,CKMB:3,CKMBINDEX:3,TROPONINI:3 in the last 72 hours BNP: No results found for this basename: PROBNP:3 in the last 72 hours D-Dimer: No results found for this basename: DDIMER:2 in the last 72 hours CBG:  Basename 09/05/11 2118  09/05/11 1710 09/05/11 1244 09/05/11 0756 09/05/11 0436 09/05/11 0118  GLUCAP 346* 281* 270* 285* 263* 281*   Hemoglobin A1C:  Basename 09/05/11 0848  HGBA1C 10.1*   Fasting Lipid Panel: No results found for this basename: CHOL,HDL,LDLCALC,TRIG,CHOLHDL,LDLDIRECT in the last 72 hours Thyroid Function Tests:  Basename 09/05/11 0848  TSH 4.273  T4TOTAL --  FREET4 --  T3FREE --  THYROIDAB --   Anemia Panel: No results found for this basename: VITAMINB12,FOLATE,FERRITIN,TIBC,IRON,RETICCTPCT in the last 72 hours Coagulation: No results found for this basename: LABPROT:2,INR:2 in the last 72 hours Urine Drug Screen: Drugs of Abuse     Component Value Date/Time   LABOPIA POSITIVE* 09/30/2008 1730   COCAINSCRNUR NONE DETECTED 09/30/2008 1730   LABBENZ NONE DETECTED 09/30/2008 1730   AMPHETMU NONE DETECTED 09/30/2008 1730   THCU POSITIVE* 09/30/2008 1730   LABBARB  Value: NONE DETECTED        DRUG SCREEN FOR MEDICAL PURPOSES ONLY.  IF CONFIRMATION IS NEEDED FOR ANY PURPOSE, NOTIFY LAB WITHIN 5 DAYS.        LOWEST DETECTABLE LIMITS FOR URINE DRUG SCREEN Drug Class       Cutoff (ng/mL) Amphetamine      1000 Barbiturate      200 Benzodiazepine   200 Tricyclics       300 Opiates          300 Cocaine  300 THC              50 09/30/2008 1730    Alcohol Level: No results found for this basename: ETH:2 in the last 72 hours Urinalysis:  Basename 09/05/11 0558  COLORURINE AMBER*  LABSPEC 1.024  PHURINE 5.5  GLUCOSEU 250*  HGBUR LARGE*  BILIRUBINUR SMALL*  KETONESUR TRACE*  PROTEINUR 100*  UROBILINOGEN 0.2  NITRITE POSITIVE*  LEUKOCYTESUR LARGE*    Recent Results (from the past 240 hour(s))  CULTURE, BLOOD (ROUTINE X 2)     Status: Normal (Preliminary result)   Collection Time   09/05/11  5:05 AM      Component Value Range Status Comment   Specimen Description BLOOD LEFT ANTECUBITAL   Final    Special Requests BOTTLES DRAWN AEROBIC AND ANAEROBIC 3CC   Final    Culture  Setup  Time [id number]   Final    Culture     Final    Value:        BLOOD CULTURE RECEIVED NO GROWTH TO DATE CULTURE WILL BE HELD FOR 5 DAYS BEFORE ISSUING A FINAL NEGATIVE REPORT   Report Status PENDING   Incomplete   CULTURE, BLOOD (ROUTINE X 2)     Status: Normal (Preliminary result)   Collection Time   09/05/11  5:11 AM      Component Value Range Status Comment   Specimen Description BLOOD LEFT ANTECUBITAL   Final    Special Requests BOTTLES DRAWN AEROBIC AND ANAEROBIC 5CC   Final    Culture  Setup Time [id number]   Final    Culture     Final    Value:        BLOOD CULTURE RECEIVED NO GROWTH TO DATE CULTURE WILL BE HELD FOR 5 DAYS BEFORE ISSUING A FINAL NEGATIVE REPORT   Report Status PENDING   Incomplete   MRSA PCR SCREENING     Status: Normal   Collection Time   09/05/11  8:23 AM      Component Value Range Status Comment   MRSA by PCR NEGATIVE  NEGATIVE  Final     Studies/Results: Ct Foot Left Wo Contrast  09/05/2011  *RADIOLOGY REPORT*  Clinical Data: Cellulitis and soft tissue ulceration on the foot. Pain.  Draining wound.  Fever.  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  CT OF THE LEFT FOOT WITHOUT CONTRAST  Technique:  Multidetector CT imaging was performed according to the standard protocol. Multiplanar CT image reconstructions were also generated.  Comparison: Radiographs dated 09/05/2011  Findings: There is no evidence of osteomyelitis of the bones of the foot.  Specifically, the stumps of the fourth and fifth metatarsals do not demonstrate any osteomyelitis.  The soft tissue ulceration is distal and lateral to the base of the fifth metatarsal and extends only 5 mm deep in the subcutaneous tissues.  The patient does have moderate osteoarthritic changes of the second, third and fourth tarsometatarsal joints as well as cystic degenerative changes of the middle and medial cuneiform.  IMPRESSION:   No evidence of osteomyelitis or deep soft tissue abscess.  Superficial ulceration and cellulitis of the distal lateral aspect of the foot.  Original Report Authenticated By: Gwynn Burly, M.D.   Dg Foot Complete Left  09/05/2011  *RADIOLOGY REPORT*  Clinical Data: Open wound along the fifth metatarsal.  LEFT FOOT - COMPLETE 3+ VIEW  Comparison: CT of the foot 03/14/2009.  Findings: The patient is status post amputation of the fourth  and fifth metatarsals.  Extensive edematous changes are noted along the lateral aspect of the foot.  There is a focal ulceration.  Gas does not extend to the level of the bone.  No osseous erosion is evident.  Moderate diffuse edematous changes are present.  IMPRESSION:  1.  Ulceration along the plantar and lateral aspect of the foot near the residual fifth metatarsal. 2.  The ulceration is not extend to the bone, nor is there any underlying osseous resorption. 3.  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  Original Report Authenticated By: Jamesetta Orleans. MATTERN, M.D.    Medications: Scheduled Meds:   . enoxaparin (LOVENOX) injection  90 mg Subcutaneous Q24H  . insulin aspart  0-15 Units Subcutaneous TID WC  . insulin aspart  0-5 Units Subcutaneous QHS  . insulin glargine  20 Units Subcutaneous Q breakfast  . piperacillin-tazobactam (ZOSYN)  IV  3.375 g Intravenous Once  . piperacillin-tazobactam (ZOSYN)  IV  3.375 g Intravenous Q8H  . pneumococcal 23 valent vaccine  0.5 mL Intramuscular Tomorrow-1000  . vancomycin  1,500 mg Intravenous Q12H  . DISCONTD: enoxaparin  40 mg Subcutaneous Q24H  . DISCONTD: insulin glargine  10 Units Subcutaneous Q breakfast   Continuous Infusions:   . 0.9 % NaCl with KCl 20 mEq / L 75 mL/hr at 09/05/11 0831   PRN Meds:.acetaminophen, acetaminophen, HYDROcodone-acetaminophen, morphine, promethazine  Assessment/Plan: 1. Diabetic foot ulcer: continue current antibiotics, will consult ortho-i suspect she will need debridement CT with no evidence of abscess or  osteomyelitis, unable to have MRI due to size 2. DM (diabetes mellitus), type 2, uncontrolled with complications: increase lantus, ssi 3. UTI on zosyn, cultures pending 4. HYPERLIPIDEMIA 5. Obesity, Class III, BMI 40-49.9 (morbid obesity) 6. Hyponatremia: suspect related to dehydration, continue IVF, improving 7. DVT prophylaxis with lovenox    LOS: 2 days   [facility] Pager: [phone] 09/06/2011, 8:00 AM

## 2011-09-06 NOTE — Progress Notes (Signed)
Patient ID: Karen Bennett, female   DOB: [birth year], 36 y.o.   MRN: [id number] I discussed with the patient debridement of her left foot ulcer to healthy base and she wished to proceed  Procedure  Left lateral foot ulcer was anesthetized with 8 cc 1% lidocaine following this the superficial thick callus and eschar was removed completely back to clean healthy base pus or abscess was found secondary to a nice pain granulation bed. Normal saline wet-to-dry gauze was applied.  Recommendatio  Wound care consult dressing changes her wound care nurse while in the hospital. Recommend followup and treatment [facility] under care of Dr. Kelly Splinter since there is no evidence of deep infection abscess no osteomyelitis spears be a superficial problem recommended by mouth antibiotics choice home health nurse for dressing changes as necessary before implementing treatment [facility] the [facility]. Would be optimal with the patient to go from the hospital to [facility] for initial evaluation Dr.Sanger and further followup and treatment there. All this was discussed with the patient and she is in agreement.

## 2011-09-06 NOTE — Consult Note (Signed)
Reason for Consult painful nonhealing ulcer lateral left foot Referring Physician: Dr. Si Raider  Karen Bennett is an 36 y.o. female.  HPI: Patient is a 36 year old female with a history of diabetes who's hypertension obesity. 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 Lajoyce Corners. Patient was admitted earlier this winter for cellulitis was treated in had improvement on IV antibiotics was discharged home patient to you have pain and reported redness at the ulcer site over the lateral aspect of the left foot patient was readmitted for reevaluation of this area. Patient reports she's not happy with previous care with Dr.Duda so our services were requested  Past Medical History  Diagnosis Date  . Cellulitis   . Diabetes mellitus   . PE (pulmonary embolism)     Past Surgical History  Procedure Date  . 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 Surgery to remove hematoma in l leg   . Abscess removal from l groin     Family History  Problem Relation Age of Onset  . Diabetes Mother   . Coronary artery disease Mother     Social History:  reports that she has never smoked. She has never used smokeless tobacco. She reports that she does not drink alcohol or use illicit drugs.  Allergies:  Allergies  Allergen Reactions  . Zofran Itching and Nausea And Vomiting  . Doxycycline     Medications: I have reviewed the patient's current medications.  Results for orders placed during the hospital encounter of 09/04/11 (from the past 48 hour(s))  GLUCOSE, CAPILLARY     Status: Abnormal   Collection Time   09/04/11  9:01 PM      Component Value Range Comment   Glucose-Capillary 290 (*) 70 - 99 (mg/dL)   GLUCOSE, CAPILLARY     Status: Abnormal   Collection Time   09/05/11  1:18 AM      Component Value Range Comment   Glucose-Capillary 281 (*) 70 - 99 (mg/dL)   CBC     Status: Abnormal   Collection Time   09/05/11  1:20 AM      Component Value  Range Comment   WBC 8.8  4.0 - 10.5 (K/uL)    RBC 5.21 (*) 3.87 - 5.11 (MIL/uL)    Hemoglobin 16.2 (*) 12.0 - 15.0 (g/dL)    HCT 62.9  52.8 - 41.3 (%)    MCV 84.8  78.0 - 100.0 (fL)    MCH 31.1  26.0 - 34.0 (pg)    MCHC 36.7 (*) 30.0 - 36.0 (g/dL)    RDW 24.4  01.0 - 27.2 (%)    Platelets 167  150 - 400 (K/uL)   DIFFERENTIAL     Status: Normal   Collection Time   09/05/11  1:20 AM      Component Value Range Comment   Neutrophils Relative 77  43 - 77 (%)    Lymphocytes Relative 15  12 - 46 (%)    Monocytes Relative 8  3 - 12 (%)    Eosinophils Relative 0  0 - 5 (%)    Basophils Relative 0  0 - 1 (%)    Neutro Abs 6.8  1.7 - 7.7 (K/uL)    Lymphs Abs 1.3  0.7 - 4.0 (K/uL)    Monocytes Absolute 0.7  0.1 - 1.0 (K/uL)    Eosinophils Absolute 0.0  0.0 - 0.7 (K/uL)  Basophils Absolute 0.0  0.0 - 0.1 (K/uL)    Smear Review MORPHOLOGY UNREMARKABLE     COMPREHENSIVE METABOLIC PANEL     Status: Abnormal   Collection Time   09/05/11  1:20 AM      Component Value Range Comment   Sodium 123 (*) 135 - 145 (mEq/L)    Potassium 3.2 (*) 3.5 - 5.1 (mEq/L)    Chloride 88 (*) 96 - 112 (mEq/L)    CO2 18 (*) 19 - 32 (mEq/L)    Glucose, Bld 246 (*) 70 - 99 (mg/dL)    BUN 13  6 - 23 (mg/dL)    Creatinine, Ser 4.54 (*) 0.50 - 1.10 (mg/dL)    Calcium 9.2  8.4 - 10.5 (mg/dL)    Total Protein 8.0  6.0 - 8.3 (g/dL)    Albumin 3.5  3.5 - 5.2 (g/dL)    AST 11  0 - 37 (U/L)    ALT 11  0 - 35 (U/L)    Alkaline Phosphatase 63  39 - 117 (U/L)    Total Bilirubin 0.9  0.3 - 1.2 (mg/dL)    GFR calc non Af Amer 46 (*) >90 (mL/min)    GFR calc Af Amer 53 (*) >90 (mL/min)   LIPASE, BLOOD     Status: Normal   Collection Time   09/05/11  1:20 AM      Component Value Range Comment   Lipase 20  11 - 59 (U/L)   GLUCOSE, CAPILLARY     Status: Abnormal   Collection Time   09/05/11  4:36 AM      Component Value Range Comment   Glucose-Capillary 263 (*) 70 - 99 (mg/dL)    Comment 1 Notify RN     CULTURE, BLOOD  (ROUTINE X 2)     Status: Normal (Preliminary result)   Collection Time   09/05/11  5:05 AM      Component Value Range Comment   Specimen Description BLOOD LEFT ANTECUBITAL      Special Requests BOTTLES DRAWN AEROBIC AND ANAEROBIC 3CC      Culture  Setup Time [id number]      Culture        Value:        BLOOD CULTURE RECEIVED NO GROWTH TO DATE CULTURE WILL BE HELD FOR 5 DAYS BEFORE ISSUING A FINAL NEGATIVE REPORT   Report Status PENDING     CULTURE, BLOOD (ROUTINE X 2)     Status: Normal (Preliminary result)   Collection Time   09/05/11  5:11 AM      Component Value Range Comment   Specimen Description BLOOD LEFT ANTECUBITAL      Special Requests BOTTLES DRAWN AEROBIC AND ANAEROBIC 5CC      Culture  Setup Time [id number]      Culture        Value:        BLOOD CULTURE RECEIVED NO GROWTH TO DATE CULTURE WILL BE HELD FOR 5 DAYS BEFORE ISSUING A FINAL NEGATIVE REPORT   Report Status PENDING     PREGNANCY, URINE     Status: Normal   Collection Time   09/05/11  5:58 AM      Component Value Range Comment   Preg Test, Ur NEGATIVE  NEGATIVE    URINALYSIS, ROUTINE W REFLEX MICROSCOPIC     Status: Abnormal   Collection Time   09/05/11  5:58 AM      Component Value Range Comment   Color, Urine AMBER (*)  YELLOW  BIOCHEMICALS MAY BE AFFECTED BY COLOR   APPearance TURBID (*) CLEAR     Specific Gravity, Urine 1.024  1.005 - 1.030     pH 5.5  5.0 - 8.0     Glucose, UA 250 (*) NEGATIVE (mg/dL)    Hgb urine dipstick LARGE (*) NEGATIVE     Bilirubin Urine SMALL (*) NEGATIVE     Ketones, ur TRACE (*) NEGATIVE (mg/dL)    Protein, ur 161 (*) NEGATIVE (mg/dL)    Urobilinogen, UA 0.2  0.0 - 1.0 (mg/dL)    Nitrite POSITIVE (*) NEGATIVE     Leukocytes, UA LARGE (*) NEGATIVE    URINE MICROSCOPIC-ADD ON     Status: Abnormal   Collection Time   09/05/11  5:58 AM      Component Value Range Comment   Squamous Epithelial / LPF MANY (*) RARE     WBC, UA TOO NUMEROUS TO COUNT  <3 (WBC/hpf) WITH CLUMPS    RBC / HPF 3-6  <3 (RBC/hpf)    Bacteria, UA MANY (*) RARE     Casts HYALINE CASTS (*) NEGATIVE     Urine-Other MUCOUS PRESENT   FEW YEAST  GLUCOSE, CAPILLARY     Status: Abnormal   Collection Time   09/05/11  7:56 AM      Component Value Range Comment   Glucose-Capillary 285 (*) 70 - 99 (mg/dL)   MRSA PCR SCREENING     Status: Normal   Collection Time   09/05/11  8:23 AM      Component Value Range Comment   MRSA by PCR NEGATIVE  NEGATIVE    OSMOLALITY     Status: Normal   Collection Time   09/05/11  8:48 AM      Component Value Range Comment   Osmolality 282  275 - 300 (mOsm/kg)   TSH     Status: Normal   Collection Time   09/05/11  8:48 AM      Component Value Range Comment   TSH 4.273  0.350 - 4.500 (uIU/mL)   CBC     Status: Abnormal   Collection Time   09/05/11  8:48 AM      Component Value Range Comment   WBC 7.3  4.0 - 10.5 (K/uL)    RBC 4.90  3.87 - 5.11 (MIL/uL)    Hemoglobin 14.9  12.0 - 15.0 (g/dL)    HCT 09.6  04.5 - 40.9 (%)    MCV 85.7  78.0 - 100.0 (fL)    MCH 30.4  26.0 - 34.0 (pg)    MCHC 35.5  30.0 - 36.0 (g/dL)    RDW 81.1  91.4 - 78.2 (%)    Platelets 139 (*) 150 - 400 (K/uL)   CREATININE, SERUM     Status: Abnormal   Collection Time   09/05/11  8:48 AM      Component Value Range Comment   Creatinine, Ser 1.07  0.50 - 1.10 (mg/dL)    GFR calc non Af Amer 67 (*) >90 (mL/min)    GFR calc Af Amer 78 (*) >90 (mL/min)   HEMOGLOBIN A1C     Status: Abnormal   Collection Time   09/05/11  8:48 AM      Component Value Range Comment   Hemoglobin A1C 10.1 (*) <5.7 (%)    Mean Plasma Glucose 243 (*) <117 (mg/dL)   OSMOLALITY, URINE     Status: Abnormal   Collection Time   09/05/11 11:56 AM  Component Value Range Comment   Osmolality, Ur 383 (*) 390 - 1090 (mOsm/kg)   SODIUM, URINE, RANDOM     Status: Normal   Collection Time   09/05/11 11:56 AM      Component Value Range Comment   Sodium, Ur 28     GLUCOSE, CAPILLARY     Status: Abnormal   Collection Time    09/05/11 12:44 PM      Component Value Range Comment   Glucose-Capillary 270 (*) 70 - 99 (mg/dL)   GLUCOSE, CAPILLARY     Status: Abnormal   Collection Time   09/05/11  5:10 PM      Component Value Range Comment   Glucose-Capillary 281 (*) 70 - 99 (mg/dL)   PROCALCITONIN     Status: Normal   Collection Time   09/05/11  7:15 PM      Component Value Range Comment   Procalcitonin 4.09     GLUCOSE, CAPILLARY     Status: Abnormal   Collection Time   09/05/11  9:18 PM      Component Value Range Comment   Glucose-Capillary 346 (*) 70 - 99 (mg/dL)    Comment 1 Notify RN     BASIC METABOLIC PANEL     Status: Abnormal   Collection Time   09/06/11 12:24 AM      Component Value Range Comment   Sodium 128 (*) 135 - 145 (mEq/L)    Potassium 3.2 (*) 3.5 - 5.1 (mEq/L) SLIGHT HEMOLYSIS   Chloride 96  96 - 112 (mEq/L)    CO2 19  19 - 32 (mEq/L)    Glucose, Bld 311 (*) 70 - 99 (mg/dL)    BUN 15  6 - 23 (mg/dL)    Creatinine, Ser 1.61  0.50 - 1.10 (mg/dL)    Calcium 8.6  8.4 - 10.5 (mg/dL)    GFR calc non Af Amer 85 (*) >90 (mL/min)    GFR calc Af Amer >90  >90 (mL/min)   CBC     Status: Abnormal   Collection Time   09/06/11 12:24 AM      Component Value Range Comment   WBC 7.0  4.0 - 10.5 (K/uL)    RBC 4.63  3.87 - 5.11 (MIL/uL)    Hemoglobin 14.0  12.0 - 15.0 (g/dL)    HCT 09.6  04.5 - 40.9 (%)    MCV 85.3  78.0 - 100.0 (fL)    MCH 30.2  26.0 - 34.0 (pg)    MCHC 35.4  30.0 - 36.0 (g/dL)    RDW 81.1  91.4 - 78.2 (%)    Platelets 125 (*) 150 - 400 (K/uL)   GLUCOSE, CAPILLARY     Status: Abnormal   Collection Time   09/06/11  7:38 AM      Component Value Range Comment   Glucose-Capillary 277 (*) 70 - 99 (mg/dL)   GLUCOSE, CAPILLARY     Status: Abnormal   Collection Time   09/06/11 11:35 AM      Component Value Range Comment   Glucose-Capillary 329 (*) 70 - 99 (mg/dL)     Ct Foot Left Wo Contrast  09/05/2011  *RADIOLOGY REPORT*  Clinical Data: Cellulitis and soft tissue ulceration on  the foot. Pain.  Draining wound.  Fever.  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  CT OF THE LEFT FOOT WITHOUT CONTRAST  Technique:  Multidetector CT imaging was performed according to the standard protocol. Multiplanar CT image  reconstructions were also generated.  Comparison: Radiographs dated 09/05/2011  Findings: There is no evidence of osteomyelitis of the bones of the foot.  Specifically, the stumps of the fourth and fifth metatarsals do not demonstrate any osteomyelitis.  The soft tissue ulceration is distal and lateral to the base of the fifth metatarsal and extends only 5 mm deep in the subcutaneous tissues.  The patient does have moderate osteoarthritic changes of the second, third and fourth tarsometatarsal joints as well as cystic degenerative changes of the middle and medial cuneiform.  IMPRESSION:   No evidence of osteomyelitis or deep soft tissue abscess. Superficial ulceration and cellulitis of the distal lateral aspect of the foot.  Original Report Authenticated By: Gwynn Burly, M.D.   Dg Foot Complete Left  09/05/2011  *RADIOLOGY REPORT*  Clinical Data: Open wound along the fifth metatarsal.  LEFT FOOT - COMPLETE 3+ VIEW  Comparison: CT of the foot 03/14/2009.  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  Extensive edematous changes are noted along the lateral aspect of the foot.  There is a focal ulceration.  Gas does not extend to the level of the bone.  No osseous erosion is evident.  Moderate diffuse edematous changes are present.  IMPRESSION:  1.  Ulceration along the plantar and lateral aspect of the foot near the residual fifth metatarsal. 2.  The ulceration is not extend to the bone, nor is there any underlying osseous resorption. 3.  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  Original Report Authenticated By: Jamesetta Orleans. MATTERN, M.D.    Review of Systems  HENT: Negative.     Respiratory: Negative.   Cardiovascular: Negative.   Genitourinary: Negative.   Musculoskeletal: Positive for joint pain.   Blood pressure 123/68, pulse 84, temperature 98.9 F (37.2 C), temperature source Oral, resp. rate 18, height 5\' 8"  (1.727 m), weight 189.604 kg (418 lb), last menstrual period 08/31/2010, SpO2 92.00%. Physical Exam patient is conscious alert and appropriate morbidly obese female. Her skin is without erythema around the lower extremities and 2+ dorsalis pedis pulse is very tender over the lateral aspect of the base of the fifth metatarsal. She has a previous well-healed lateral foot incision in this area. She has a large area of dry callus as scarring skin over the area of very tenderness there is no open drainage no erythema. She's got a very small central area of scabbing in.    Assessment/Plan: Impression left foot superficial ulceration and cellulitis over the distal lateral aspect of the fifth metatarsal base. No evidence of osteomyelitis or deep soft tissue abscess her CT scan. Diabetes and morbid obesity.  Plan findings were reviewed with the patient discussed bedside debridement of callus and ulceration tissue down to clean tissue and then continued wound care treatment through the wound care clinic. Further treatment plan will be to decided after a bedside debridement. Patient was seen and physical exam and decision was made by Dr. Thomasena Edis for further treatment.  Jamelle Rushing 09/06/2011, 4:35 PM

## 2011-09-07 LAB — BASIC METABOLIC PANEL
BUN: 12 mg/dL (ref 6–23)
CO2: 23 mEq/L (ref 19–32)
Calcium: 8.9 mg/dL (ref 8.4–10.5)
Chloride: 97 mEq/L (ref 96–112)
Creatinine, Ser: 0.87 mg/dL (ref 0.50–1.10)
GFR calc Af Amer: >90 mL/min (ref >90)
GFR calc non Af Amer: 86 mL/min — ABNORMAL LOW (ref >90)
Glucose, Bld: 258 mg/dL — ABNORMAL HIGH (ref 70–99)
Potassium: 3.1 mEq/L — ABNORMAL LOW (ref 3.5–5.1)
Sodium: 131 mEq/L — ABNORMAL LOW (ref 135–145)

## 2011-09-07 LAB — GLUCOSE, CAPILLARY
Glucose-Capillary: 256 mg/dL — ABNORMAL HIGH (ref 70–99)
Glucose-Capillary: 253 mg/dL — ABNORMAL HIGH (ref 70–99)
Glucose-Capillary: 256 mg/dL — ABNORMAL HIGH (ref 70–99)

## 2011-09-07 MED ORDER — SODIUM CHLORIDE 0.9 % IV SOLN
INTRAVENOUS | Status: DC | PRN
  Administered 2011-09-07: 250 mL via INTRAVENOUS

## 2011-09-07 MED ORDER — SULFAMETHOXAZOLE-TRIMETHOPRIM 800-160 MG PO TABS: 1.0000 | ORAL_TABLET | Freq: Two times a day (BID) | ORAL | Status: DC

## 2011-09-07 MED ORDER — SULFAMETHOXAZOLE-TMP DS 800-160 MG PO TABS
1.0000 | ORAL_TABLET | Freq: Two times a day (BID) | ORAL | Status: DC
  Administered 2011-09-07 – 2011-09-08 (×2): 1 via ORAL
  Filled 2011-09-07 (×5): qty 1

## 2011-09-07 MED ORDER — HYDROCODONE-ACETAMINOPHEN 5-325 MG PO TABS: 1.0000 | ORAL_TABLET | ORAL | Status: AC | PRN

## 2011-09-07 MED ORDER — INSULIN GLARGINE 100 UNIT/ML ~~LOC~~ SOLN
50.0000 [IU] | Freq: Every day | SUBCUTANEOUS | Status: DC
  Administered 2011-09-08: 50 [IU] via SUBCUTANEOUS
  Filled 2011-09-07: qty 3

## 2011-09-07 NOTE — Progress Notes (Signed)
Inpatient Diabetes Recommendations:  Results for Karen Bennett, Karen Bennett (MRN [id number]) as of 09/07/2011 15:56  Ref. Range 09/06/2011 07:38 09/06/2011 11:35 09/06/2011 17:44 09/06/2011 22:12 09/07/2011 07:58 09/07/2011 12:11  Glucose-Capillary Latest Range: 70-99 mg/dL 956 (H) 213 (H) 086 (H) 217 (H) 253 (H) 256 (H)   Add Novolog 6 units tidwc for meal coverage insulin. Increase Novolog correction to resistant tidwc and hs.

## 2011-09-07 NOTE — Progress Notes (Signed)
Subjective:     Pt comfortable with a little burning on side of foot.     Objective: Vital signs in last 24 hours: Temp:  [98.8 F (37.1 C)-100.5 F (38.1 C)] 100.5 F (38.1 C) (02/14 0745) Pulse Rate:  [80-99] 87  (02/14 0745) Resp:  [18] 18  (02/14 0745) BP: (123-140)/(68-79) 128/78 mmHg (02/14 0745) SpO2:  [92 %-95 %] 95 % (02/14 0745)  Intake/Output from previous day: 02/13 0701 - 02/14 0700 In: 5241 [P.O.:1520; I.V.:2521; IV Piggyback:1200] Out: 2625 [Urine:2625] Intake/Output this shift:     Basename 09/06/11 0024 09/05/11 0848 09/05/11 0120  HGB 14.0 14.9 16.2*    Basename 09/06/11 0024 09/05/11 0848  WBC 7.0 7.3  RBC 4.63 4.90  HCT 39.5 42.0  PLT 125* 139*    Basename 09/07/11 0325 09/06/11 0024  NA 131* 128*  K 3.1* 3.2*  CL 97 96  CO2 23 19  BUN 12 15  CREATININE 0.87 0.88  GLUCOSE 258* 311*  CALCIUM 8.9 8.6   No results found for this basename: LABPT:2,INR:2 in the last 72 hours  Pt cons and alert sitting up eating. No distress. Left foot dressing intact. Toe pink brisk cap refill.  Assessment/Plan: S/p Ulcer debridment ding well. Will take down dressing in am.       Jamelle Rushing 09/07/2011, 1:27 PM

## 2011-09-07 NOTE — Progress Notes (Signed)
Subjective: Pain improved some  Objective: Vital signs in last 24 hours: Temp:  [98.8 F (37.1 C)-100.5 F (38.1 C)] 99 F (37.2 C) (02/14 1444) Pulse Rate:  [80-99] 88  (02/14 1444) Resp:  [18] 18  (02/14 1444) BP: (103-140)/(67-79) 103/67 mmHg (02/14 1444) SpO2:  [92 %-95 %] 93 % (02/14 1444) Weight change:  Last BM Date: 09/04/11  Intake/Output from previous day: 02/13 0701 - 02/14 0700 In: 5241 [P.O.:1520; I.V.:2521; IV Piggyback:1200] Out: 2625 [Urine:2625]     Physical Exam: General: Alert, awake, oriented x3, in no acute distress. HEENT: No bruits, no goiter. Heart: Regular rate and rhythm, without murmurs, rubs, gallops. Lungs: Clear to auscultation bilaterally. Abdomen: Soft, minimaly tender in LUQ, nondistended, positive bowel sounds. Extremities: L foot with dressing  Neuro: Grossly intact, nonfocal.    Lab Results: Basic Metabolic Panel:  Basename 09/07/11 0325 09/06/11 0024  NA 131* 128*  K 3.1* 3.2*  CL 97 96  CO2 23 19  GLUCOSE 258* 311*  BUN 12 15  CREATININE 0.87 0.88  CALCIUM 8.9 8.6  MG -- --  PHOS -- --   Liver Function Tests:  Basename 09/05/11 0120  AST 11  ALT 11  ALKPHOS 63  BILITOT 0.9  PROT 8.0  ALBUMIN 3.5    Basename 09/05/11 0120  LIPASE 20  AMYLASE --   No results found for this basename: AMMONIA:2 in the last 72 hours CBC:  Basename 09/06/11 0024 09/05/11 0848 09/05/11 0120  WBC 7.0 7.3 --  NEUTROABS -- -- 6.8  HGB 14.0 14.9 --  HCT 39.5 42.0 --  MCV 85.3 85.7 --  PLT 125* 139* --   Cardiac Enzymes: No results found for this basename: CKTOTAL:3,CKMB:3,CKMBINDEX:3,TROPONINI:3 in the last 72 hours BNP: No results found for this basename: PROBNP:3 in the last 72 hours D-Dimer: No results found for this basename: DDIMER:2 in the last 72 hours CBG:  Basename 09/07/11 1211 09/07/11 0758 09/06/11 2212 09/06/11 1744 09/06/11 1135 09/06/11 0738  GLUCAP 256* 253* 217* 305* 329* 277*   Hemoglobin  A1C:  Basename 09/05/11 0848  HGBA1C 10.1*   Fasting Lipid Panel: No results found for this basename: CHOL,HDL,LDLCALC,TRIG,CHOLHDL,LDLDIRECT in the last 72 hours Thyroid Function Tests:  Basename 09/05/11 0848  TSH 4.273  T4TOTAL --  FREET4 --  T3FREE --  THYROIDAB --   Anemia Panel: No results found for this basename: VITAMINB12,FOLATE,FERRITIN,TIBC,IRON,RETICCTPCT in the last 72 hours Coagulation: No results found for this basename: LABPROT:2,INR:2 in the last 72 hours Urine Drug Screen: Drugs of Abuse     Component Value Date/Time   LABOPIA POSITIVE* 09/30/2008 1730   COCAINSCRNUR NONE DETECTED 09/30/2008 1730   LABBENZ NONE DETECTED 09/30/2008 1730   AMPHETMU NONE DETECTED 09/30/2008 1730   THCU POSITIVE* 09/30/2008 1730   LABBARB  Value: NONE DETECTED        DRUG SCREEN FOR MEDICAL PURPOSES ONLY.  IF CONFIRMATION IS NEEDED FOR ANY PURPOSE, NOTIFY LAB WITHIN 5 DAYS.        LOWEST DETECTABLE LIMITS FOR URINE DRUG SCREEN Drug Class       Cutoff (ng/mL) Amphetamine      1000 Barbiturate      200 Benzodiazepine   200 Tricyclics       300 Opiates          300 Cocaine          300 THC              50 09/30/2008 1730    Alcohol  Level: No results found for this basename: ETH:2 in the last 72 hours Urinalysis:  Basename 09/05/11 0558  COLORURINE AMBER*  LABSPEC 1.024  PHURINE 5.5  GLUCOSEU 250*  HGBUR LARGE*  BILIRUBINUR SMALL*  KETONESUR TRACE*  PROTEINUR 100*  UROBILINOGEN 0.2  NITRITE POSITIVE*  LEUKOCYTESUR LARGE*    Recent Results (from the past 240 hour(s))  CULTURE, BLOOD (ROUTINE X 2)     Status: Normal (Preliminary result)   Collection Time   09/05/11  5:05 AM      Component Value Range Status Comment   Specimen Description BLOOD LEFT ANTECUBITAL   Final    Special Requests BOTTLES DRAWN AEROBIC AND ANAEROBIC 3CC   Final    Culture  Setup Time [id number]   Final    Culture     Final    Value:        BLOOD CULTURE RECEIVED NO GROWTH TO DATE CULTURE WILL BE  HELD FOR 5 DAYS BEFORE ISSUING A FINAL NEGATIVE REPORT   Report Status PENDING   Incomplete   CULTURE, BLOOD (ROUTINE X 2)     Status: Normal (Preliminary result)   Collection Time   09/05/11  5:11 AM      Component Value Range Status Comment   Specimen Description BLOOD LEFT ANTECUBITAL   Final    Special Requests BOTTLES DRAWN AEROBIC AND ANAEROBIC 5CC   Final    Culture  Setup Time [id number]   Final    Culture     Final    Value:        BLOOD CULTURE RECEIVED NO GROWTH TO DATE CULTURE WILL BE HELD FOR 5 DAYS BEFORE ISSUING A FINAL NEGATIVE REPORT   Report Status PENDING   Incomplete   MRSA PCR SCREENING     Status: Normal   Collection Time   09/05/11  8:23 AM      Component Value Range Status Comment   MRSA by PCR NEGATIVE  NEGATIVE  Final   URINE CULTURE     Status: Normal   Collection Time   09/05/11 11:56 AM      Component Value Range Status Comment   Specimen Description URINE, CLEAN CATCH   Final    Special Requests Zosyn   Final    Culture  Setup Time [id number]   Final    Colony Count NO GROWTH   Final    Culture NO GROWTH   Final    Report Status 09/06/2011 FINAL   Final     Studies/Results: No results found.  Medications: Scheduled Meds:    . enoxaparin (LOVENOX) injection  90 mg Subcutaneous Q24H  . insulin aspart  0-15 Units Subcutaneous TID WC  . insulin aspart  0-5 Units Subcutaneous QHS  . insulin glargine  50 Units Subcutaneous Q breakfast  . lidocaine  30 mL Intradermal Once  . sulfamethoxazole-trimethoprim  1 tablet Oral Q12H  . DISCONTD: insulin glargine  40 Units Subcutaneous Q breakfast  . DISCONTD: piperacillin-tazobactam (ZOSYN)  IV  3.375 g Intravenous Q8H  . DISCONTD: vancomycin  1,500 mg Intravenous Q12H   Continuous Infusions:    . DISCONTD: 0.9 % NaCl with KCl 20 mEq / L 1,000 mL (09/07/11 0403)   PRN Meds:.sodium chloride, acetaminophen, acetaminophen, HYDROcodone-acetaminophen, morphine, promethazine  Assessment/Plan: 1. Diabetic  foot ulcer: change to oral bactrim DS Appreciate ortho consult/assistance Pt reports will not be able to follow up at wound center due to limited/no transportation options CT with no evidence of abscess or osteomyelitis, unable  to have MRI due to size 2. DM (diabetes mellitus), type 2, uncontrolled with complications: increase lantus, ssi, unable to afford meds, [facility] issues 3. UTI , cultures negative, DC zosyn, bactrim should cover usual pathogens 4. HYPERLIPIDEMIA 5. Obesity, Class III, BMI 40-49.9 (morbid obesity) 6. Hyponatremia: suspect related to dehydration, DC IVF, improving 7. DVT prophylaxis with lovenox Dispo: home tomorrow, after ortho wound re-eval  LOS: 3 days   [facility] Pager: [phone] 09/07/2011, 3:48 PM

## 2011-09-08 LAB — GLUCOSE, CAPILLARY
Glucose-Capillary: 214 mg/dL — ABNORMAL HIGH (ref 70–99)
Glucose-Capillary: 191 mg/dL — ABNORMAL HIGH (ref 70–99)

## 2011-09-08 MED ORDER — SULFAMETHOXAZOLE-TRIMETHOPRIM 800-160 MG PO TABS: 1.0000 | ORAL_TABLET | Freq: Two times a day (BID) | ORAL | Status: AC

## 2011-09-08 NOTE — Progress Notes (Signed)
Patient discharged home, all discharge medications and instructions reviewed and questions answered. Patient to be assisted to vehicle by wheelchair.  

## 2011-09-08 NOTE — Discharge Summary (Signed)
Physician Discharge Summary  Patient ID: Karen Bennett MRN: [id number] DOB/AGE: [birth year] 36 y.o.  Admit date: 09/04/2011 Discharge date: 09/08/2011  Primary Care Physician:  No primary provider on file.   Discharge Diagnoses:    Principal Problem:  *Diabetic foot ulcer Active Problems:  DM (diabetes mellitus), type 2, uncontrolled with complications  HYPERLIPIDEMIA  Obesity, Class III, BMI 40-49.9 (morbid obesity)  Hyponatremia    Medication List  As of 09/08/2011  4:07 PM   TAKE these medications         acetaminophen 325 MG tablet   Commonly known as: TYLENOL   Take 2 tablets (650 mg total) by mouth every 6 (six) hours as needed (or Fever >/= 101).      HYDROcodone-acetaminophen 5-325 MG per tablet   Commonly known as: NORCO   Take 1-2 tablets by mouth every 4 (four) hours as needed.      insulin aspart protamine-insulin aspart (70-30) 100 UNIT/ML injection   Commonly known as: NOVOLOG 70/30   Inject 30 Units into the skin 2 (two) times daily with a meal.      sulfamethoxazole-trimethoprim 800-160 MG per tablet   Commonly known as: BACTRIM DS,SEPTRA DS   Take 1 tablet by mouth 2 (two) times daily.             Disposition and Follow-up:  Dr.Collins in 1 week  Consults: Dr.Collins orthopedics   Significant Diagnostic Studies:  Ct Foot Left Wo Contrast 09/05/2011  IMPRESSION:   No evidence of osteomyelitis or deep soft tissue abscess. Superficial ulceration and cellulitis of the distal lateral aspect of the foot.  Original Report Authenticated By: Gwynn Burly, M.D.   Dg Foot Complete Left 09/05/2011    IMPRESSION:  1.  Ulceration along the plantar and lateral aspect of the foot near the residual fifth metatarsal. 2.  The ulceration is not extend to the bone, nor is there any underlying osseous resorption. 3.  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  Original Report Authenticated By: Jamesetta Orleans. MATTERN, M.D.    Brief H  and P: Karen Bennett is a 36 year old morbidly obese diabetic female who was recently discharged from the hospital for antibiotic course for her left foot diabetic ulcer with cellulitis presented to the ER on 2/12 with increasing pain at the site of her ulcer with purulent discharge, nausea, vomiting and feeling feverish.  Hospital Course:  1.Diabetic foot ulcer: For her diabetic foot ulcer she was started on IV vancomycin as well as Zosyn, was seen by Dr. Thomasena Edis with orthopedics, she had a CT of her lower extremity which did not show any evidence of osteomyelitis or underlying abscess. Could not undergo an MRI due to size. Subsequently underwent debridement of the diabetic foot ulcer, has had dressing changes since, has been advised to follow up at the wound care center at [facility] long however patient reports she would be unable to do this since she does not have transportation. She is also set up with a home health nurse for dressing changes and wound check. She is also advised to follow up with Dr. Thomasena Edis. 2. Uncontrolled diabetes: Started on lantus while in the hospital being transitioned back to 70/30, unfortunately compliance continues to be a big issue due to cost and transportation,  patient reports that she is unable to [facility] at [facility] due to transportation related issues. She is been seen by social work as well as case management to assist with possible resources in  the community. 3. Non-compliance: poor motivation with cost and transportation remain challenges 3. Morbid obesity: extensively counselled  Time spent on Discharge:  Signed: Jamone Garrido [facility] Pager: [phone] 09/08/2011, 4:08 PM

## 2011-09-08 NOTE — Progress Notes (Signed)
Subjective: Patient reports her foot is not as painful today   Objective: Vital signs in last 24 hours: Temp:  [97.8 F (36.6 C)-100.5 F (38.1 C)] 97.8 F (36.6 C) (02/15 0450) Pulse Rate:  [80-88] 80  (02/15 0450) Resp:  [17-18] 17  (02/15 0450) BP: (103-161)/(67-78) 136/75 mmHg (02/15 0450) SpO2:  [93 %-95 %] 95 % (02/15 0450)  Intake/Output from previous day: 02/14 0701 - 02/15 0700 In: 1230 [P.O.:1230] Out: -  Intake/Output this shift: Total I/O In: 750 [P.O.:750] Out: -    Basename 09/06/11 0024 09/05/11 0848  HGB 14.0 14.9    Basename 09/06/11 0024 09/05/11 0848  WBC 7.0 7.3  RBC 4.63 4.90  HCT 39.5 42.0  PLT 125* 139*    Basename 09/07/11 0325 09/06/11 0024  NA 131* 128*  K 3.1* 3.2*  CL 97 96  CO2 23 19  BUN 12 15  CREATININE 0.87 0.88  GLUCOSE 258* 311*  CALCIUM 8.9 8.6   No results found for this basename: LABPT:2,INR:2 in the last 72 hours  Patient is conscious alert and appropriate resting in a hospital bed. Patient's left foot is without any erythema just a good dorsalis pedis pulse good light touch sensation throughout she is sensitive with cleaning of the foot. There is a no drainage in the wound the edges are clean  Assessment/Plan: #1 left foot nonhealing ulcer no signs of abscess or osteomyelitis with a history of a previous fourth and fifth partial ray amputation in a diabetic patient Plan patient is to followup with the palp[facility] wound care center for long-term treatment care of her ulcer. She should put as little weight as possible on the foot. Continue with oral antibiotics per medical service. Patient is to have daily wet-to-dry dressing changes will need outpatient home health nurse visits. Patient can make a followup appointment with Dr. Thomasena Edis in 2 weeks for wound care check and referral to a foot specialist for further treatment necessary. Will sign off at this time thank you for the consult. Orthopedic office number [phone]  for her appointment   Jamelle Rushing 09/08/2011, 6:35 AM

## 2011-09-08 NOTE — Progress Notes (Signed)
Spoke with patient several times over last several days regarding transportation issues. Patient states only transportation she has is from a friend after 4 weekdays and on weekends. CSW has provided transportation options for her but she either does not qualify or it is not affordable. Encouraged her to continue to work on trying to arrange transportation with her support system and make appts made at [facility]. Will arrange [facility] with [facility] for wound care and CSW to continue to assist with social needs.

## 2011-09-11 LAB — CULTURE, BLOOD (ROUTINE X 2): Culture: NO GROWTH

## 2011-11-10 ENCOUNTER — Emergency Department (HOSPITAL_COMMUNITY): Payer: Self-pay

## 2011-11-10 ENCOUNTER — Inpatient Hospital Stay (HOSPITAL_COMMUNITY)
Admission: EM | Admit: 2011-11-10 | Discharge: 2011-11-14 | DRG: 629 | Disposition: A | Discharge status: Home / Self Care | Payer: Self-pay | Attending: Internal Medicine | Admitting: Internal Medicine

## 2011-11-10 ENCOUNTER — Encounter (HOSPITAL_COMMUNITY): Payer: Self-pay | Admitting: *Deleted

## 2011-11-10 DIAGNOSIS — E118 Type 2 diabetes mellitus with unspecified complications: Secondary | ICD-10-CM | POA: Diagnosis present

## 2011-11-10 DIAGNOSIS — IMO0002 Reserved for concepts with insufficient information to code with codable children: Principal | ICD-10-CM | POA: Diagnosis present

## 2011-11-10 DIAGNOSIS — L0390 Cellulitis, unspecified: Secondary | ICD-10-CM | POA: Diagnosis present

## 2011-11-10 DIAGNOSIS — E871 Hypo-osmolality and hyponatremia: Secondary | ICD-10-CM | POA: Diagnosis present

## 2011-11-10 DIAGNOSIS — Z86711 Personal history of pulmonary embolism: Secondary | ICD-10-CM

## 2011-11-10 DIAGNOSIS — L97509 Non-pressure chronic ulcer of other part of unspecified foot with unspecified severity: Secondary | ICD-10-CM | POA: Diagnosis present

## 2011-11-10 DIAGNOSIS — S98139A Complete traumatic amputation of one unspecified lesser toe, initial encounter: Secondary | ICD-10-CM

## 2011-11-10 DIAGNOSIS — A498 Other bacterial infections of unspecified site: Secondary | ICD-10-CM | POA: Diagnosis present

## 2011-11-10 DIAGNOSIS — Z9119 Patient's noncompliance with other medical treatment and regimen: Secondary | ICD-10-CM

## 2011-11-10 DIAGNOSIS — E785 Hyperlipidemia, unspecified: Secondary | ICD-10-CM | POA: Diagnosis present

## 2011-11-10 DIAGNOSIS — E1165 Type 2 diabetes mellitus with hyperglycemia: Principal | ICD-10-CM | POA: Diagnosis present

## 2011-11-10 DIAGNOSIS — M79672 Pain in left foot: Secondary | ICD-10-CM | POA: Diagnosis present

## 2011-11-10 DIAGNOSIS — I2699 Other pulmonary embolism without acute cor pulmonale: Secondary | ICD-10-CM | POA: Diagnosis present

## 2011-11-10 DIAGNOSIS — A4901 Methicillin susceptible Staphylococcus aureus infection, unspecified site: Secondary | ICD-10-CM | POA: Diagnosis present

## 2011-11-10 DIAGNOSIS — Z91199 Patient's noncompliance with other medical treatment and regimen due to unspecified reason: Secondary | ICD-10-CM

## 2011-11-10 DIAGNOSIS — Z794 Long term (current) use of insulin: Secondary | ICD-10-CM | POA: Diagnosis present

## 2011-11-10 DIAGNOSIS — D72829 Elevated white blood cell count, unspecified: Secondary | ICD-10-CM | POA: Diagnosis present

## 2011-11-10 DIAGNOSIS — E1169 Type 2 diabetes mellitus with other specified complication: Principal | ICD-10-CM | POA: Diagnosis present

## 2011-11-10 DIAGNOSIS — Z6841 Body Mass Index (BMI) 40.0 and over, adult: Secondary | ICD-10-CM

## 2011-11-10 DIAGNOSIS — Z833 Family history of diabetes mellitus: Secondary | ICD-10-CM

## 2011-11-10 DIAGNOSIS — L02619 Cutaneous abscess of unspecified foot: Secondary | ICD-10-CM | POA: Diagnosis present

## 2011-11-10 DIAGNOSIS — I10 Essential (primary) hypertension: Secondary | ICD-10-CM | POA: Diagnosis present

## 2011-11-10 DIAGNOSIS — E119 Type 2 diabetes mellitus without complications: Secondary | ICD-10-CM | POA: Diagnosis present

## 2011-11-10 LAB — POCT I-STAT, CHEM 8
Glucose, Bld: 302 mg/dL — ABNORMAL HIGH (ref 70–99)
HCT: 48 % — ABNORMAL HIGH (ref 36.0–46.0)
Hemoglobin: 16.3 g/dL — ABNORMAL HIGH (ref 12.0–15.0)
Potassium: 4.3 mEq/L (ref 3.5–5.1)

## 2011-11-10 LAB — CBC
Hemoglobin: 16 g/dL — ABNORMAL HIGH (ref 12.0–15.0)
MCHC: 35.5 g/dL (ref 30.0–36.0)
RDW: 12.8 % (ref 11.5–15.5)

## 2011-11-10 LAB — DIFFERENTIAL
Basophils Absolute: 0 10*3/uL (ref 0.0–0.1)
Basophils Relative: 0 % (ref 0–1)
Monocytes Relative: 6 % (ref 3–12)
Neutro Abs: 7.5 10*3/uL (ref 1.7–7.7)
Neutrophils Relative %: 73 % (ref 43–77)

## 2011-11-10 LAB — GLUCOSE, CAPILLARY: Glucose-Capillary: 297 mg/dL — ABNORMAL HIGH (ref 70–99)

## 2011-11-10 MED ORDER — HEPARIN SODIUM (PORCINE) 5000 UNIT/ML IJ SOLN
5000.0000 [IU] | Freq: Three times a day (TID) | INTRAMUSCULAR | Status: DC
  Administered 2011-11-11 (×2): 5000 [IU] via SUBCUTANEOUS
  Filled 2011-11-10 (×5): qty 1

## 2011-11-10 MED ORDER — INSULIN GLARGINE 100 UNIT/ML ~~LOC~~ SOLN
10.0000 [IU] | Freq: Every day | SUBCUTANEOUS | Status: DC
  Administered 2011-11-11: 10 [IU] via SUBCUTANEOUS

## 2011-11-10 MED ORDER — SODIUM CHLORIDE 0.9 % IJ SOLN
3.0000 mL | Freq: Two times a day (BID) | INTRAMUSCULAR | Status: DC
  Administered 2011-11-11: 10 mL via INTRAVENOUS
  Administered 2011-11-11 – 2011-11-14 (×5): 3 mL via INTRAVENOUS

## 2011-11-10 MED ORDER — OXYCODONE HCL 5 MG PO TABS
5.0000 mg | ORAL_TABLET | ORAL | Status: DC | PRN
  Administered 2011-11-10 – 2011-11-11 (×2): 5 mg via ORAL
  Filled 2011-11-10: qty 1

## 2011-11-10 MED ORDER — ACETAMINOPHEN 650 MG RE SUPP: 650.0000 mg | Freq: Four times a day (QID) | RECTAL | Status: DC | PRN

## 2011-11-10 MED ORDER — SENNA 8.6 MG PO TABS
1.0000 | ORAL_TABLET | Freq: Two times a day (BID) | ORAL | Status: DC
  Administered 2011-11-11 – 2011-11-14 (×7): 8.6 mg via ORAL
  Filled 2011-11-10 (×7): qty 1

## 2011-11-10 MED ORDER — CLINDAMYCIN PHOSPHATE 600 MG/50ML IV SOLN
600.0000 mg | Freq: Once | INTRAVENOUS | Status: AC
  Administered 2011-11-10: 600 mg via INTRAVENOUS
  Filled 2011-11-10: qty 50

## 2011-11-10 MED ORDER — SODIUM CHLORIDE 0.9 % IV SOLN
250.0000 mL | INTRAVENOUS | Status: DC | PRN
  Administered 2011-11-12: 1000 mL via INTRAVENOUS

## 2011-11-10 MED ORDER — HYDROMORPHONE HCL PF 1 MG/ML IJ SOLN
1.0000 mg | Freq: Once | INTRAMUSCULAR | Status: AC
  Administered 2011-11-10: 1 mg via INTRAVENOUS
  Filled 2011-11-10: qty 1

## 2011-11-10 MED ORDER — ONDANSETRON HCL 4 MG/2ML IJ SOLN: 4.0000 mg | Freq: Four times a day (QID) | INTRAMUSCULAR | Status: DC | PRN

## 2011-11-10 MED ORDER — ACETAMINOPHEN 325 MG PO TABS
650.0000 mg | ORAL_TABLET | Freq: Four times a day (QID) | ORAL | Status: DC | PRN
  Administered 2011-11-11: 650 mg via ORAL
  Filled 2011-11-10: qty 2

## 2011-11-10 MED ORDER — SODIUM CHLORIDE 0.9 % IV SOLN: INTRAVENOUS | Status: DC

## 2011-11-10 MED ORDER — SODIUM CHLORIDE 0.9 % IJ SOLN: 3.0000 mL | INTRAMUSCULAR | Status: DC | PRN

## 2011-11-10 MED ORDER — INSULIN ASPART 100 UNIT/ML ~~LOC~~ SOLN
0.0000 [IU] | Freq: Three times a day (TID) | SUBCUTANEOUS | Status: DC
  Administered 2011-11-11: 11 [IU] via SUBCUTANEOUS
  Administered 2011-11-11: 7 [IU] via SUBCUTANEOUS
  Administered 2011-11-11: 11 [IU] via SUBCUTANEOUS
  Administered 2011-11-12 (×2): 7 [IU] via SUBCUTANEOUS
  Administered 2011-11-13: 4 [IU] via SUBCUTANEOUS
  Administered 2011-11-13: 7 [IU] via SUBCUTANEOUS
  Administered 2011-11-13: 4 [IU] via SUBCUTANEOUS
  Administered 2011-11-14 (×2): 7 [IU] via SUBCUTANEOUS

## 2011-11-10 MED ORDER — OXYCODONE-ACETAMINOPHEN 5-325 MG PO TABS
2.0000 | ORAL_TABLET | Freq: Once | ORAL | Status: AC
  Administered 2011-11-10: 2 via ORAL
  Filled 2011-11-10: qty 2

## 2011-11-10 MED ORDER — ONDANSETRON HCL 4 MG PO TABS: 4.0000 mg | ORAL_TABLET | Freq: Four times a day (QID) | ORAL | Status: DC | PRN

## 2011-11-10 MED ORDER — OXYCODONE-ACETAMINOPHEN 5-325 MG PO TABS
ORAL_TABLET | ORAL | Status: AC
  Filled 2011-11-10: qty 1

## 2011-11-10 MED ORDER — DOCUSATE SODIUM 100 MG PO CAPS
100.0000 mg | ORAL_CAPSULE | Freq: Two times a day (BID) | ORAL | Status: DC
  Administered 2011-11-11 – 2011-11-14 (×7): 100 mg via ORAL
  Filled 2011-11-10 (×11): qty 1

## 2011-11-10 NOTE — ED Notes (Signed)
Pt co of Left foot pain, pt states wound on left foot started out as a blister then turned into a callas, the callas was cut open 1  1/2 months ago. Last night foot began to swell. Pt has a open wound at sole of left foot no obvious drainage but odorous. Pt is diabetic

## 2011-11-10 NOTE — H&P (Signed)
PCP:  No primary provider on file.  She has been unable to follow up with anyone due to transportation issues  Chief Complaint:  Left foot pain and swelling  HPI: 35yoF with h/o uncontrolled DM and left foot DM ulceration, s/p 4th and 5th metatarsal  amputations, and recent admissions for DM ulceration and cellulitis now presents with  acute left foot swelling and pain, possible 3rd metatarsal fracture vs Charcot changes.   Pt has been admitted twice in 07/2011 and 08/2011. In Jan, presented with LLE swelling,  redness, pain coming from a blister on base of left foot 4d prior to presentation, that  progressed to LLE cellulitis. Start Vanc/Zosyn, transitioned to Bactrim. Planned MRI, but  she was too obese for the scanner and LE dopplers were done instead (?) which were  negative for DVT. A1c was 11.3, case management consulted bc pt unable to control  diabetes due to financial difficulty.   Then, readmitted 08/2011 when the blister opened and ulcerated and was draining. Noted to  have poor medical complaince due to transportation issues. IV vanc/zosyn ordered. Dr  Thomasena Edis in ortho saw the pt, had CT of LLE which didn't show osteo or abscess, had  debridement of the DM foot ulcer. Advised to see [facility] wound care, but stated she  wouldn't be able to due to transportation, so set up with home health nurse for wound  dressings, wound checks. On Lantus in hospital, back to 70/30 on discharge, but again  compliance, cost, transportation were persistent issues.   After discharge pt states she was slowly improving. She had a visiting nurse for a week  but then was no longer able to keep coming, so she has been dressing it and cleaning it  on her own, applying saline and gauze and dial antibacterial soap. Overall, it was slowly  improving up until Wednesday, when she was walking and felt acute onset left foot pain,  which has progressively gotten worse in the past 1-2d with associated left  dorsal foot  swelling. However, she denies any erythema or warmth, denies fevers, chills, sweats, or  feeling systemically ill -- just pain and swelling, making ambulation difficult.   Vitals with HTN up to 219/120 otherwise stable, and currently BP improved. Labs showed  hypoNa 133, glucose 302, WBC 10.4, Hgb 16.0. Wound culture pending. Left foot plain film  showed significant soft tissue swelling and small amt of soft tissue gas vs defect  lateral to 5th metatarsal, is s/p 4th and 5th metatarsal amputations, and possible  fracture vs Charcot changes at base 3rd metatarsal. Pt was given 600 mg Clinda, dilaudid,  percocet.   ROS as above, otherwise pt wihtout complaint and was in usual state of health. She  appears sad and expresses frustration that she has really been trying to take care of  herself and the wound on her own, but can't afford insulin and has no transportation to  get to appointments. She has only one friend who helps her, and has no family or other  assistance otherwise. She has applied for Medicaid and other assistance to no avail.     Past Medical History  Diagnosis Date  . Cellulitis   . Diabetes mellitus   . PE (pulmonary embolism) ~2007-2008    Not on anticoagulation    Past Surgical History  Procedure Date  . 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 Surgery to remove hematoma in l leg   . Abscess removal  from l groin     Medications:  HOME MEDS: She doesn't take any daily meds, could not afford insulin Prior to Admission medications   Medication Sig Start Date End Date Taking? Authorizing Provider  ibuprofen (ADVIL,MOTRIN) 200 MG tablet Take 400 mg by mouth every 6 (six) hours as needed. pain   Yes Historical Provider, MD    Allergies:  Allergies  Allergen Reactions  . Zofran Itching and Nausea And Vomiting  . Doxycycline Nausea And Vomiting  . Morphine And Related Itching    Social History:   reports that she has never smoked. She has never  used smokeless tobacco. She reports that she does not drink alcohol or use illicit drugs. Lives at home on her own and is ambulatory, is single and has no children, no family who checks up on her or helps her, and only one friend who helps with daily activities when she can. She is a never smoker.    Family History: Family History  Problem Relation Age of Onset  . Diabetes Mother   . Coronary artery disease Mother     Physical Exam: Filed Vitals:   11/10/11 1533 11/10/11 1600 11/10/11 1649 11/10/11 1923  BP: 219/120 158/87 150/74 146/70  Pulse: 87  77 70  Temp: 98.2 F (36.8 C)  98 F (36.7 C) 98.4 F (36.9 C)  TempSrc: Oral  Oral Oral  Resp: 20  20 21   SpO2: 99%  99% 99%   Blood pressure 146/70, pulse 70, temperature 98.4 F (36.9 C), temperature source Oral, resp. rate 21, last menstrual period 10/19/2011, SpO2 99.00%. Gen: Pleasant, very morbidly obese, non-toxic, non-ill appearing F in no distress, able  to relate history well, is soft-spoken and appears sad and understandably frustrated with  her medical situation HEENT: Pupils round, reactive, EOMI, sclera clear and normal. Mouth is moist and normal  appearing.  Lungs: CTAB no w/c/r, good air movement, normal exam throughout Heart: Faint, soft S1/2, no m/g appreciated or other adventitious heart sounds Abd: Large pannus, but soft, non tender, not distended, no facial grimacing to palpation Extrem: Increased bulk, but normal tone, normal perfusion, no BLE edema noted. Her left  foot shows 4th/5th digit amputations and a lateral defect is present from the amputation  that is basline. However, there is soft tissue swelling on the lateral, dorsal aspect of  the foot that is asymmetric, and it is exquisitely tender to palpation. Frankly, there is  not much erythema or warmth and it doesn't look infected to me. On the lateral plantar  aspect there is a diabetic foot ulceration with some serosanguinous drainage, but no  purulence  noted, and no surrounding erythema Neuro: Alert, attentive, conversant, CN 2-12 intact, able to move extremities well,  grossly non-focal.    Labs & Imaging Results for orders placed during the hospital encounter of 11/10/11 (from the past 48 hour(s))  GLUCOSE, CAPILLARY     Status: Abnormal   Collection Time   11/10/11  3:30 PM      Component Value Range Comment   Glucose-Capillary 297 (*) 70 - 99 (mg/dL)    Comment 1 Notify RN     CBC     Status: Abnormal   Collection Time   11/10/11  5:40 PM      Component Value Range Comment   WBC 10.4  4.0 - 10.5 (K/uL)    RBC 5.21 (*) 3.87 - 5.11 (MIL/uL)    Hemoglobin 16.0 (*) 12.0 - 15.0 (g/dL)  HCT 45.1  36.0 - 46.0 (%)    MCV 86.6  78.0 - 100.0 (fL)    MCH 30.7  26.0 - 34.0 (pg)    MCHC 35.5  30.0 - 36.0 (g/dL)    RDW 78.2  95.6 - 21.3 (%)    Platelets 259  150 - 400 (K/uL)   DIFFERENTIAL     Status: Normal   Collection Time   11/10/11  5:40 PM      Component Value Range Comment   Neutrophils Relative 73  43 - 77 (%)    Neutro Abs 7.5  1.7 - 7.7 (K/uL)    Lymphocytes Relative 20  12 - 46 (%)    Lymphs Abs 2.1  0.7 - 4.0 (K/uL)    Monocytes Relative 6  3 - 12 (%)    Monocytes Absolute 0.6  0.1 - 1.0 (K/uL)    Eosinophils Relative 1  0 - 5 (%)    Eosinophils Absolute 0.1  0.0 - 0.7 (K/uL)    Basophils Relative 0  0 - 1 (%)    Basophils Absolute 0.0  0.0 - 0.1 (K/uL)   POCT I-STAT, CHEM 8     Status: Abnormal   Collection Time   11/10/11  5:49 PM      Component Value Range Comment   Sodium 133 (*) 135 - 145 (mEq/L)    Potassium 4.3  3.5 - 5.1 (mEq/L)    Chloride 102  96 - 112 (mEq/L)    BUN 10  6 - 23 (mg/dL)    Creatinine, Ser 0.86  0.50 - 1.10 (mg/dL)    Glucose, Bld 578 (*) 70 - 99 (mg/dL)    Calcium, Ion 4.69  1.12 - 1.32 (mmol/L)    TCO2 23  0 - 100 (mmol/L)    Hemoglobin 16.3 (*) 12.0 - 15.0 (g/dL)    HCT 62.9 (*) 52.8 - 46.0 (%)    Dg Foot Complete Left  11/10/2011  *RADIOLOGY REPORT*  Clinical Data: Lateral foot  pain and swelling.  LEFT FOOT - COMPLETE 3+ VIEW  Comparison: 09/05/2011, plain films and CT  Findings: There is significant soft tissue swelling along the lateral aspect of the foot.  The patient has had previous amputations of the fourth and fifth metatarsals at the proximal aspects.  There is a small amount of soft tissue gas or defect lateral to the fifth metatarsal base.  Small bone density is identified adjacent to the base of the third metatarsal, possibly representing the small bone fracture or sequestrum.  Findings may be related to Charcot joint.  IMPRESSION:  1.  Significant soft tissue swelling and soft tissue gas as described. 2.  Soft tissue swelling may be slightly increased. 3.  Possible fracture of the base of the third metatarsal versus Charcot changes/bony sequestrum.  Original Report Authenticated By: Patterson Hammersmith, M.D.    Impression Present on Admission:  .DM (diabetes mellitus), type 2, uncontrolled with complications .Diabetic foot ulcer .Foot pain, left   35yoF with h/o uncontrolled DM and left foot DM ulceration, s/p 4th and 5th metatarsal  amputations, and recent admissions for DM ulceration and cellulitis now presents with  acute left foot swelling and pain, possible 3rd metatarsal fracture vs Charcot changes.   1. DM foot ulceration, with acute swelling, pain: I do not think this is an infected  ulcer -- there is no erythema, warmth, WBC count, or systemic symptoms. Rather, I think  this is a developing Charcot foot and possible 3rd  metatarsal fracture as evidenced by  x-ray.  - Orthopedics consultation, surgical vs boot mangement? Pain control, elevation. No  indication for antibiotics at present   2. Diabetes: will start lantus and SSI. A1c 10.1 in 08/2011, don't see need to repeat and  I'm pretty sure it will be high.   3. Social: Really this is the issue underlying all of the above, and the reason for the  3rd admission since 07/2011. She is massively  obese, and other then a friend who helps her  get around, she has no family, no friends, etc. to help her. She has no finances to get  medications, and appears to have earnestly tried to get Medicaid and other assistance but  without success.   SubQ heparin Regular bed, [facility] team 3 Presumed full code  Other plans as per orders.  Ester Hilley 11/10/2011, 9:56 PM

## 2011-11-10 NOTE — ED Provider Notes (Addendum)
History     CSN: [id number]  Arrival date & time 11/10/11  1526   First MD Initiated Contact with Patient 11/10/11 1620      Chief Complaint  Patient presents with  . Foot Pain    (Consider location/radiation/quality/duration/timing/severity/associated sxs/prior treatment) HPI Comments: Pt with about 2 month hx of non-healing callous to left foot.  Over last few days has become increasingly more swollen and painful.  Still draining.  Has had 1st 2 toes amputated already from prior infections.  No fevers.  No vomiting.  Has been trying to clean it at home without improvement  The history is provided by the patient.    Past Medical History  Diagnosis Date  . Cellulitis   . Diabetes mellitus   . PE (pulmonary embolism)     Past Surgical History  Procedure Date  . 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 Surgery to remove hematoma in l leg   . Abscess removal from l groin     Family History  Problem Relation Age of Onset  . Diabetes Mother   . Coronary artery disease Mother     History  Substance Use Topics  . Smoking status: Never Smoker   . Smokeless tobacco: Never Used  . Alcohol Use: No    OB History    Grav Para Term Preterm Abortions TAB SAB Ect Mult Living                  Review of Systems  Constitutional: Negative for fever, chills, diaphoresis and fatigue.  HENT: Negative for congestion, rhinorrhea and sneezing.   Eyes: Negative.   Respiratory: Negative for cough, chest tightness and shortness of breath.   Cardiovascular: Negative for chest pain and leg swelling.  Gastrointestinal: Negative for nausea, vomiting, abdominal pain, diarrhea and blood in stool.  Genitourinary: Negative for frequency, hematuria, flank pain and difficulty urinating.  Musculoskeletal: Positive for joint swelling. Negative for back pain and arthralgias.  Skin: Positive for wound. Negative for rash.  Neurological: Negative for dizziness, speech difficulty, weakness, numbness  and headaches.    Allergies  Zofran; Doxycycline; and Morphine and related  Home Medications   Current Outpatient Rx  Name Route Sig Dispense Refill  . IBUPROFEN 200 MG PO TABS Oral Take 400 mg by mouth every 6 (six) hours as needed. pain      BP 146/70  Pulse 70  Temp(Src) 98.4 F (36.9 C) (Oral)  Resp 21  SpO2 99%  LMP 10/19/2011  Physical Exam  Constitutional: She is oriented to person, place, and time. She appears well-developed and well-nourished.  HENT:  Head: Normocephalic and atraumatic.  Eyes: Pupils are equal, round, and reactive to light.  Neck: Normal range of motion. Neck supple.  Cardiovascular: Normal rate, regular rhythm and normal heart sounds.   Pulmonary/Chest: Effort normal and breath sounds normal. No respiratory distress. She has no wheezes. She has no rales. She exhibits no tenderness.  Abdominal: Soft. Bowel sounds are normal. There is no tenderness. There is no rebound and no guarding.  Musculoskeletal: Normal range of motion. She exhibits no edema.       2cm open calloused area to plantar surface of foot.  Moderate amount of erythema and swelling around area.  No evident necrotic areas  Lymphadenopathy:    She has no cervical adenopathy.  Neurological: She is alert and oriented to person, place, and time.  Skin: Skin is warm and dry. No rash noted.  Psychiatric: She has a normal mood and affect.    ED Course  Procedures (including critical care time)  Results for orders placed during the hospital encounter of 11/10/11  GLUCOSE, CAPILLARY      Component Value Range   Glucose-Capillary 297 (*) 70 - 99 (mg/dL)   Comment 1 Notify RN    POCT I-STAT, CHEM 8      Component Value Range   Sodium 133 (*) 135 - 145 (mEq/L)   Potassium 4.3  3.5 - 5.1 (mEq/L)   Chloride 102  96 - 112 (mEq/L)   BUN 10  6 - 23 (mg/dL)   Creatinine, Ser 1.61  0.50 - 1.10 (mg/dL)   Glucose, Bld 096 (*) 70 - 99 (mg/dL)   Calcium, Ion 0.45  4.09 - 1.32 (mmol/L)   TCO2 23   0 - 100 (mmol/L)   Hemoglobin 16.3 (*) 12.0 - 15.0 (g/dL)   HCT 81.1 (*) 91.4 - 46.0 (%)  CBC      Component Value Range   WBC 10.4  4.0 - 10.5 (K/uL)   RBC 5.21 (*) 3.87 - 5.11 (MIL/uL)   Hemoglobin 16.0 (*) 12.0 - 15.0 (g/dL)   HCT 78.2  95.6 - 21.3 (%)   MCV 86.6  78.0 - 100.0 (fL)   MCH 30.7  26.0 - 34.0 (pg)   MCHC 35.5  30.0 - 36.0 (g/dL)   RDW 08.6  57.8 - 46.9 (%)   Platelets 259  150 - 400 (K/uL)  DIFFERENTIAL      Component Value Range   Neutrophils Relative 73  43 - 77 (%)   Neutro Abs 7.5  1.7 - 7.7 (K/uL)   Lymphocytes Relative 20  12 - 46 (%)   Lymphs Abs 2.1  0.7 - 4.0 (K/uL)   Monocytes Relative 6  3 - 12 (%)   Monocytes Absolute 0.6  0.1 - 1.0 (K/uL)   Eosinophils Relative 1  0 - 5 (%)   Eosinophils Absolute 0.1  0.0 - 0.7 (K/uL)   Basophils Relative 0  0 - 1 (%)   Basophils Absolute 0.0  0.0 - 0.1 (K/uL)   Dg Foot Complete Left  11/10/2011  *RADIOLOGY REPORT*  Clinical Data: Lateral foot pain and swelling.  LEFT FOOT - COMPLETE 3+ VIEW  Comparison: 09/05/2011, plain films and CT  Findings: There is significant soft tissue swelling along the lateral aspect of the foot.  The patient has had previous amputations of the fourth and fifth metatarsals at the proximal aspects.  There is a small amount of soft tissue gas or defect lateral to the fifth metatarsal base.  Small bone density is identified adjacent to the base of the third metatarsal, possibly representing the small bone fracture or sequestrum.  Findings may be related to Charcot joint.  IMPRESSION:  1.  Significant soft tissue swelling and soft tissue gas as described. 2.  Soft tissue swelling may be slightly increased. 3.  Possible fracture of the base of the third metatarsal versus Charcot changes/bony sequestrum.  Original Report Authenticated By: Patterson Hammersmith, M.D.     Dg Foot Complete Left  11/10/2011  *RADIOLOGY REPORT*  Clinical Data: Lateral foot pain and swelling.  LEFT FOOT - COMPLETE 3+ VIEW   Comparison: 09/05/2011, plain films and CT  Findings: There is significant soft tissue swelling along the lateral aspect of the foot.  The patient has had previous amputations of the fourth and fifth metatarsals at the proximal aspects.  There is a small  amount of soft tissue gas or defect lateral to the fifth metatarsal base.  Small bone density is identified adjacent to the base of the third metatarsal, possibly representing the small bone fracture or sequestrum.  Findings may be related to Charcot joint.  IMPRESSION:  1.  Significant soft tissue swelling and soft tissue gas as described. 2.  Soft tissue swelling may be slightly increased. 3.  Possible fracture of the base of the third metatarsal versus Charcot changes/bony sequestrum.  Original Report Authenticated By: Patterson Hammersmith, M.D.     1. Cellulitis       MDM  Pt with diabetic foot ulcer, no evidence of necrotizing facsiitis.  No definite osteo.  There is one area of gas in tissues that appears to be where the ulcer is.   Spoke with hospitalist who will admit pt        Rolan Bucco, MD 11/10/11 1610  Rolan Bucco, MD 11/10/11 2028

## 2011-11-11 DIAGNOSIS — I2699 Other pulmonary embolism without acute cor pulmonale: Secondary | ICD-10-CM | POA: Diagnosis present

## 2011-11-11 DIAGNOSIS — D72829 Elevated white blood cell count, unspecified: Secondary | ICD-10-CM | POA: Diagnosis present

## 2011-11-11 LAB — CBC
HCT: 41.5 % (ref 36.0–46.0)
MCHC: 35.4 g/dL (ref 30.0–36.0)
Platelets: 252 10*3/uL (ref 150–400)
RDW: 12.9 % (ref 11.5–15.5)
WBC: 10.6 10*3/uL — ABNORMAL HIGH (ref 4.0–10.5)

## 2011-11-11 LAB — GLUCOSE, CAPILLARY
Glucose-Capillary: 274 mg/dL — ABNORMAL HIGH (ref 70–99)
Glucose-Capillary: 230 mg/dL — ABNORMAL HIGH (ref 70–99)
Glucose-Capillary: 206 mg/dL — ABNORMAL HIGH (ref 70–99)

## 2011-11-11 LAB — BASIC METABOLIC PANEL
BUN: 10 mg/dL (ref 6–23)
Calcium: 9.1 mg/dL (ref 8.4–10.5)
Creatinine, Ser: 0.6 mg/dL (ref 0.50–1.10)
GFR calc Af Amer: >90 mL/min (ref >90)
GFR calc non Af Amer: >90 mL/min (ref >90)

## 2011-11-11 MED ORDER — HYDROMORPHONE HCL PF 1 MG/ML IJ SOLN
1.0000 mg | INTRAMUSCULAR | Status: DC | PRN
  Administered 2011-11-11 – 2011-11-12 (×4): 1 mg via INTRAVENOUS
  Filled 2011-11-11 (×4): qty 1

## 2011-11-11 MED ORDER — PROMETHAZINE HCL 25 MG/ML IJ SOLN
12.5000 mg | Freq: Four times a day (QID) | INTRAMUSCULAR | Status: DC | PRN
  Administered 2011-11-11 – 2011-11-14 (×8): 25 mg via INTRAVENOUS
  Filled 2011-11-11 (×8): qty 1

## 2011-11-11 MED ORDER — ENOXAPARIN SODIUM 150 MG/ML ~~LOC~~ SOLN
180.0000 mg | Freq: Two times a day (BID) | SUBCUTANEOUS | Status: DC
  Filled 2011-11-11 (×2): qty 2

## 2011-11-11 MED ORDER — ENOXAPARIN SODIUM 150 MG/ML ~~LOC~~ SOLN
180.0000 mg | Freq: Two times a day (BID) | SUBCUTANEOUS | Status: AC
  Administered 2011-11-11: 180 mg via SUBCUTANEOUS
  Filled 2011-11-11 (×2): qty 2

## 2011-11-11 MED ORDER — CLINDAMYCIN PHOSPHATE 600 MG/50ML IV SOLN
600.0000 mg | Freq: Four times a day (QID) | INTRAVENOUS | Status: DC
  Administered 2011-11-11 – 2011-11-14 (×13): 600 mg via INTRAVENOUS
  Filled 2011-11-11 (×16): qty 50

## 2011-11-11 NOTE — Progress Notes (Signed)
ANTICOAGULATION CONSULT NOTE - Initial Consult  Pharmacy Consult for Lovenox Indication: pulmonary embolus  Allergies  Allergen Reactions  . Zofran Itching and Nausea And Vomiting  . Doxycycline Nausea And Vomiting  . Morphine And Related Itching    Patient Measurements: Height: 5\' 8"  (172.7 cm) Weight: 395 lb 11.2 oz (179.488 kg) IBW/kg (Calculated) : 63.9   Vital Signs: Temp: 98.1 F (36.7 C) (04/20 0503) Temp src: Oral (04/20 0503) BP: 135/84 mmHg (04/20 0503) Pulse Rate: 64  (04/20 0503)  Labs:  Basename 11/11/11 0352 11/10/11 1749 11/10/11 1740  HGB 14.7 16.3* --  HCT 41.5 48.0* 45.1  PLT 252 -- 259  APTT -- -- --  LABPROT -- -- --  INR -- -- --  HEPARINUNFRC -- -- --  CREATININE 0.60 0.70 --  CKTOTAL -- -- --  CKMB -- -- --  TROPONINI -- -- --   Estimated Creatinine Clearance: 172.2 ml/min (by C-G formula based on Cr of 0.6).  Medical History: Past Medical History  Diagnosis Date  . Cellulitis   . Diabetes mellitus   . PE (pulmonary embolism) ~2007-2008    Not on anticoagulation    Assessment: 35yof with history of PE to begin treatment dose Lovenox for possible new PE.  Patient has been off anticoagulation for years.  CrCl>100 ml/hr, weight is 179 kg.  Capping of LMWH doses does not appear to be needed for the treatment of VTE.  Will dose as recommended by literature review at 1 mg/kg q12h for obese patient.  Goal of Therapy:  0.5-1.0 IU/mL   Plan:  Lovenox 180 mg SQ q12h. F/u anti-Xa level 4 hours following 3rd dose.  Clance Boll 11/11/2011,2:37 PM

## 2011-11-11 NOTE — Consult Note (Signed)
Reason for Consult:  Left acute on chronic foot ulcer with infection  Referring Physician:   Elisabeth Pigeon, MD [facility])  Karen Bennett is an 36 y.o. female.  HPI:   36 yo female with a left foot wound that she has had since February of this year.  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 Lajoyce Corners.  She has not followed up with him since.  This past Wednesday, the left foot wound opened up and began draining more.  She developed worsening pain and swelling and was admitted last evening to the medicine service and started on IV antibiotics.  Ortho was consulted for recommendations, eval&tx.  Past Medical History  Diagnosis Date  . Cellulitis   . Diabetes mellitus   . PE (pulmonary embolism) ~2007-2008    Not on anticoagulation    Past Surgical History  Procedure Date  . 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 Surgery to remove hematoma in l leg   . Abscess removal from l groin     Family History  Problem Relation Age of Onset  . Diabetes Mother   . Coronary artery disease Mother     Social History:  reports that she has never smoked. She has never used smokeless tobacco. She reports that she does not drink alcohol or use illicit drugs.  Allergies:  Allergies  Allergen Reactions  . Zofran Itching and Nausea And Vomiting  . Doxycycline Nausea And Vomiting  . Morphine And Related Itching    Medications: I have reviewed the patient's current medications.  Results for orders placed during the hospital encounter of 11/10/11 (from the past 48 hour(s))  GLUCOSE, CAPILLARY     Status: Abnormal   Collection Time   11/10/11  3:30 PM      Component Value Range Comment   Glucose-Capillary 297 (*) 70 - 99 (mg/dL)    Comment 1 Notify RN     CBC     Status: Abnormal   Collection Time   11/10/11  5:40 PM      Component Value Range Comment   WBC 10.4  4.0 - 10.5 (K/uL)    RBC 5.21 (*) 3.87 - 5.11 (MIL/uL)    Hemoglobin 16.0 (*) 12.0 - 15.0  (g/dL)    HCT 40.9  81.1 - 91.4 (%)    MCV 86.6  78.0 - 100.0 (fL)    MCH 30.7  26.0 - 34.0 (pg)    MCHC 35.5  30.0 - 36.0 (g/dL)    RDW 78.2  95.6 - 21.3 (%)    Platelets 259  150 - 400 (K/uL)   DIFFERENTIAL     Status: Normal   Collection Time   11/10/11  5:40 PM      Component Value Range Comment   Neutrophils Relative 73  43 - 77 (%)    Neutro Abs 7.5  1.7 - 7.7 (K/uL)    Lymphocytes Relative 20  12 - 46 (%)    Lymphs Abs 2.1  0.7 - 4.0 (K/uL)    Monocytes Relative 6  3 - 12 (%)    Monocytes Absolute 0.6  0.1 - 1.0 (K/uL)    Eosinophils Relative 1  0 - 5 (%)    Eosinophils Absolute 0.1  0.0 - 0.7 (K/uL)    Basophils Relative 0  0 - 1 (%)    Basophils Absolute 0.0  0.0 - 0.1 (K/uL)   POCT I-STAT,  CHEM 8     Status: Abnormal   Collection Time   11/10/11  5:49 PM      Component Value Range Comment   Sodium 133 (*) 135 - 145 (mEq/L)    Potassium 4.3  3.5 - 5.1 (mEq/L)    Chloride 102  96 - 112 (mEq/L)    BUN 10  6 - 23 (mg/dL)    Creatinine, Ser 1.61  0.50 - 1.10 (mg/dL)    Glucose, Bld 096 (*) 70 - 99 (mg/dL)    Calcium, Ion 0.45  1.12 - 1.32 (mmol/L)    TCO2 23  0 - 100 (mmol/L)    Hemoglobin 16.3 (*) 12.0 - 15.0 (g/dL)    HCT 40.9 (*) 81.1 - 46.0 (%)   WOUND CULTURE     Status: Normal (Preliminary result)   Collection Time   11/10/11  7:53 PM      Component Value Range Comment   Specimen Description FOOT      Special Requests Normal      Gram Stain        Value: FEW WBC PRESENT,BOTH PMN AND MONONUCLEAR     NO SQUAMOUS EPITHELIAL CELLS SEEN     FEW GRAM POSITIVE COCCI IN PAIRS   Culture PENDING      Report Status PENDING     BASIC METABOLIC PANEL     Status: Abnormal   Collection Time   11/11/11  3:52 AM      Component Value Range Comment   Sodium 127 (*) 135 - 145 (mEq/L)    Potassium 4.7  3.5 - 5.1 (mEq/L) SLIGHT HEMOLYSIS   Chloride 95 (*) 96 - 112 (mEq/L)    CO2 22  19 - 32 (mEq/L)    Glucose, Bld 269 (*) 70 - 99 (mg/dL)    BUN 10  6 - 23 (mg/dL)     Creatinine, Ser 9.14  0.50 - 1.10 (mg/dL)    Calcium 9.1  8.4 - 10.5 (mg/dL)    GFR calc non Af Amer >90  >90 (mL/min)    GFR calc Af Amer >90  >90 (mL/min)   CBC     Status: Abnormal   Collection Time   11/11/11  3:52 AM      Component Value Range Comment   WBC 10.6 (*) 4.0 - 10.5 (K/uL) WHITE COUNT CONFIRMED ON SMEAR   RBC 4.80  3.87 - 5.11 (MIL/uL)    Hemoglobin 14.7  12.0 - 15.0 (g/dL)    HCT 78.2  95.6 - 21.3 (%)    MCV 86.5  78.0 - 100.0 (fL)    MCH 30.6  26.0 - 34.0 (pg)    MCHC 35.4  30.0 - 36.0 (g/dL)    RDW 08.6  57.8 - 46.9 (%)    Platelets 252  150 - 400 (K/uL)   GLUCOSE, CAPILLARY     Status: Abnormal   Collection Time   11/11/11  7:43 AM      Component Value Range Comment   Glucose-Capillary 274 (*) 70 - 99 (mg/dL)    Comment 1 Documented in Chart      Comment 2 Notify RN     GLUCOSE, CAPILLARY     Status: Abnormal   Collection Time   11/11/11  1:55 PM      Component Value Range Comment   Glucose-Capillary 230 (*) 70 - 99 (mg/dL)     Dg Foot Complete Left  11/10/2011  *RADIOLOGY REPORT*  Clinical Data: Lateral foot pain and swelling.  LEFT FOOT - COMPLETE 3+ VIEW  Comparison: 09/05/2011, plain films and CT  Findings: There is significant soft tissue swelling along the lateral aspect of the foot.  The patient has had previous amputations of the fourth and fifth metatarsals at the proximal aspects.  There is a small amount of soft tissue gas or defect lateral to the fifth metatarsal base.  Small bone density is identified adjacent to the base of the third metatarsal, possibly representing the small bone fracture or sequestrum.  Findings may be related to Charcot joint.  IMPRESSION:  1.  Significant soft tissue swelling and soft tissue gas as described. 2.  Soft tissue swelling may be slightly increased. 3.  Possible fracture of the base of the third metatarsal versus Charcot changes/bony sequestrum.  Original Report Authenticated By: Patterson Hammersmith, M.D.    Review of  Systems  All other systems reviewed and are negative.   Blood pressure 135/84, pulse 64, temperature 98.1 F (36.7 C), temperature source Oral, resp. rate 18, height 5\' 8"  (1.727 m), weight 179.488 kg (395 lb 11.2 oz), last menstrual period 10/19/2011, SpO2 94.00%. Physical Exam  Musculoskeletal:       Feet:    Assessment/Plan: Left Diabetic Foot Ulcer with Active Infection 1)  Given the amount of swelling she is having and the pain on exam, I feel that this needs a surgical exploration and debridement.  I'm concerned about worsening infection due to her pain, swelling, and her WBC being higher at 10.6 than it was in February when it was 7.0.  I talked with her in length about this and will plan on surgery for tomorrow 4/21.  I will make her NPO after midnight and proceed with surgery tomorrow.  Kathryne Hitch 11/11/2011, 3:21 PM

## 2011-11-11 NOTE — Progress Notes (Addendum)
Patient ID: Karen Bennett, female   DOB: September 27, 1975, 36 y.o.   MRN: [id number]  Assessment and Plan:  Principal Problem:   *Cellulitis - infected diabetic foot ulcer - continue clindamycin Q 6 hours IV - follow up wound culture results  Active Problems:  History of PE - patient has stopped taking lovenox and coumadin about few years ago but she has stopped on her own as she had no PCP; I entirely sure what the provoking incident was at that time to account for extensive pulmonary embolism but considering her debilitated state which includes but is not limited to morbid obesity and toes amputation (left foot) I think she should be on lovenox. Unfortunately due to her weight issues CT scan does not support her weight (up to 350 lbs). - spoke with pulmonary MD and recommendation was unless there is a strong evidence of new PE, Lovenox is not recommended - we will Discontinue lovenox   DM (diabetes mellitus), type 2, uncontrolled with complications - sliding scale insulin - continue CBG monitoring   Obesity, Class III, BMI 40-49.9 (morbid obesity) - nutrition consult   Hyponatremia - unclear etiology; the patient does tell me she avoids salt at home as she is afraid it will raise her blood pressure - we will repeat in am but for now continue NS; if sodium trends up then we will discontinue IV fluids   Diabetic foot ulcer - ortho consult appreciated - PT/OT evaluation   Leukocytosis - secondary to cellulitis - continue clindamycin Q 6 hours - follow up wound culture results  DVT Prophylaxis - lovenox subQ  Bennett Status - full Bennett  Education  - test results and diagnostic studies were discussed with patient  - patient verbalized the understanding - questions were answered at the bedside and contact information was provided for additional questions or concerns  Subjective: No events overnight.   Objective:  Vital signs in last 24 hours:  Filed Vitals:   11/10/11  1649 11/10/11 1923 11/10/11 2225 11/11/11 0503  BP: 150/74 146/70 122/68 135/84  Pulse: 77 70 78 64  Temp: 98 F (36.7 C) 98.4 F (36.9 C) 97.9 F (36.6 C) 98.1 F (36.7 C)  TempSrc: Oral Oral Oral Oral  Resp: 20 21 18 18   Height:   5\' 8"  (1.727 m)   Weight:   179.488 kg (395 lb 11.2 oz)   SpO2: 99% 99% 92% 94%    Intake/Output from previous day:   Gross per 24 hour  Intake    480 ml  Output      0 ml  Net    480 ml    Physical Exam: General: Alert, awake, oriented x3, in no acute distress; morbidly obese HEENT: No bruits, no goiter. Moist mucous membranes, no scleral icterus, no conjunctival pallor. Heart: Regular rate and rhythm, S1/S2 +, no murmurs, rubs, gallops. Lungs: Clear to auscultation bilaterally. No wheezing, no rhonchi, no rales.  Abdomen: Soft, nontender, nondistended, positive bowel sounds. Extremities: left foot ulcer about < 1 inch in diameter without drainage but there is a slight surrounding erythema and warmth  nonfocal.  Lab Results:  Lab 11/11/11 0352 11/10/11 1749 11/10/11 1740  WBC 10.6* -- 10.4  HGB 14.7 16.3* 16.0*  HCT 41.5 48.0* 45.1  PLT 252 -- 259  MCV 86.5 -- 86.6    Lab 11/11/11 0352 11/10/11 1749  NA 127* 133*  K 4.7 4.3  CL 95* 102  CO2 22 --  GLUCOSE 269* 302*  BUN 10  10  CREATININE 0.60 0.70  CALCIUM 9.1 --   WOUND CULTURE     Status: Normal (Preliminary result)   Collection Time   11/10/11  7:53 PM      Component Value Range Status Comment   Specimen Description FOOT   Final    Special Requests Normal   Final    Gram Stain     Final    Value: FEW WBC PRESENT,BOTH PMN AND MONONUCLEAR     NO SQUAMOUS EPITHELIAL CELLS SEEN     FEW GRAM POSITIVE COCCI IN PAIRS   Culture PENDING   Incomplete    Report Status PENDING   Incomplete     Studies/Results: Dg Foot Complete Left 11/10/2011 IMPRESSION:  1.  Significant soft tissue swelling and soft tissue gas as described. 2.  Soft tissue swelling may be slightly increased. 3.   Possible fracture of the base of the third metatarsal versus Charcot changes/bony sequestrum.     Medications: Scheduled Meds:   . clindamycin (CLEOCIN) IV  600 mg Intravenous Q6H  . docusate sodium  100 mg Oral BID  . heparin  5,000 Units Subcutaneous Q8H  .  HYDROmorphone (DILAUDID) injection  1 mg Intravenous Once  . insulin aspart  0-20 Units Subcutaneous TID WC  . insulin glargine  10 Units Subcutaneous QHS  . oxyCODONE-acetaminophen  2 tablet Oral Once  . senna  1 tablet Oral BID  . sodium chloride  3 mL Intravenous Q12H  . DISCONTD: sodium chloride   Intravenous STAT   Continuous Infusions:  PRN Meds:.sodium chloride, acetaminophen, acetaminophen, HYDROmorphone (DILAUDID) injection, sodium chloride, DISCONTD: ondansetron (ZOFRAN) IV, DISCONTD: ondansetron, DISCONTD: oxyCODONE   LOS: 1 day   Karen Bennett 11/11/2011, 2:12 PM  [facility] Pager: [phone]

## 2011-11-12 ENCOUNTER — Encounter (HOSPITAL_COMMUNITY): Payer: Self-pay | Admitting: Anesthesiology

## 2011-11-12 ENCOUNTER — Encounter (HOSPITAL_COMMUNITY)
Admission: EM | Disposition: A | Discharge status: Home / Self Care | Payer: Self-pay | Source: Home / Self Care | Attending: Internal Medicine

## 2011-11-12 ENCOUNTER — Inpatient Hospital Stay (HOSPITAL_COMMUNITY): Payer: Self-pay | Admitting: Anesthesiology

## 2011-11-12 HISTORY — PX: I & D EXTREMITY: SHX5045

## 2011-11-12 LAB — CBC
HCT: 38.4 % (ref 36.0–46.0)
Hemoglobin: 13.8 g/dL (ref 12.0–15.0)
MCHC: 35.9 g/dL (ref 30.0–36.0)
RBC: 4.39 MIL/uL (ref 3.87–5.11)
WBC: 7.6 10*3/uL (ref 4.0–10.5)

## 2011-11-12 LAB — GLUCOSE, CAPILLARY
Glucose-Capillary: 206 mg/dL — ABNORMAL HIGH (ref 70–99)
Glucose-Capillary: 197 mg/dL — ABNORMAL HIGH (ref 70–99)
Glucose-Capillary: 236 mg/dL — ABNORMAL HIGH (ref 70–99)

## 2011-11-12 LAB — BASIC METABOLIC PANEL
BUN: 11 mg/dL (ref 6–23)
CO2: 20 mEq/L (ref 19–32)
Chloride: 100 mEq/L (ref 96–112)
GFR calc non Af Amer: >90 mL/min (ref >90)
Glucose, Bld: 220 mg/dL — ABNORMAL HIGH (ref 70–99)
Potassium: 4 mEq/L (ref 3.5–5.1)
Sodium: 133 mEq/L — ABNORMAL LOW (ref 135–145)

## 2011-11-12 SURGERY — IRRIGATION AND DEBRIDEMENT EXTREMITY
Anesthesia: General | Site: Foot | Laterality: Left | Wound class: Dirty or Infected

## 2011-11-12 MED ORDER — ENOXAPARIN SODIUM 100 MG/ML ~~LOC~~ SOLN
0.5000 mg/kg | SUBCUTANEOUS | Status: DC
  Administered 2011-11-13 – 2011-11-14 (×2): 90 mg via SUBCUTANEOUS
  Filled 2011-11-12 (×4): qty 1

## 2011-11-12 MED ORDER — HYDROMORPHONE HCL PF 1 MG/ML IJ SOLN
1.0000 mg | INTRAMUSCULAR | Status: DC | PRN
  Administered 2011-11-12 – 2011-11-14 (×10): 1 mg via INTRAVENOUS
  Filled 2011-11-12 (×10): qty 1

## 2011-11-12 MED ORDER — EPHEDRINE SULFATE 50 MG/ML IJ SOLN
INTRAMUSCULAR | Status: DC | PRN
  Administered 2011-11-12: 5 mg via INTRAVENOUS

## 2011-11-12 MED ORDER — FENTANYL CITRATE 0.05 MG/ML IJ SOLN
25.0000 ug | INTRAMUSCULAR | Status: DC | PRN
  Administered 2011-11-12 (×2): 50 ug via INTRAVENOUS

## 2011-11-12 MED ORDER — SODIUM CHLORIDE 0.9 % IR SOLN
Status: DC | PRN
  Administered 2011-11-12: 3000 mL

## 2011-11-12 MED ORDER — METOCLOPRAMIDE HCL 5 MG/ML IJ SOLN
INTRAMUSCULAR | Status: DC | PRN
  Administered 2011-11-12: 10 mg via INTRAVENOUS

## 2011-11-12 MED ORDER — METHOCARBAMOL 500 MG PO TABS
500.0000 mg | ORAL_TABLET | Freq: Four times a day (QID) | ORAL | Status: DC | PRN
  Administered 2011-11-12: 500 mg via ORAL
  Filled 2011-11-12: qty 1

## 2011-11-12 MED ORDER — LIDOCAINE HCL (CARDIAC) 20 MG/ML IV SOLN
INTRAVENOUS | Status: DC | PRN
  Administered 2011-11-12: 100 mg via INTRAVENOUS

## 2011-11-12 MED ORDER — FENTANYL CITRATE 0.05 MG/ML IJ SOLN
INTRAMUSCULAR | Status: DC | PRN
  Administered 2011-11-12: 100 ug via INTRAVENOUS

## 2011-11-12 MED ORDER — OXYCODONE HCL 5 MG PO TABS
10.0000 mg | ORAL_TABLET | ORAL | Status: DC | PRN
  Administered 2011-11-12 – 2011-11-14 (×5): 10 mg via ORAL
  Filled 2011-11-12 (×2): qty 2
  Filled 2011-11-12: qty 1
  Filled 2011-11-12 (×3): qty 2

## 2011-11-12 MED ORDER — MIDAZOLAM HCL 5 MG/5ML IJ SOLN
INTRAMUSCULAR | Status: DC | PRN
  Administered 2011-11-12: 1 mg via INTRAVENOUS

## 2011-11-12 MED ORDER — INSULIN GLARGINE 100 UNIT/ML ~~LOC~~ SOLN
15.0000 [IU] | Freq: Every day | SUBCUTANEOUS | Status: DC
  Administered 2011-11-12: 15 [IU] via SUBCUTANEOUS

## 2011-11-12 MED ORDER — PROPOFOL 10 MG/ML IV BOLUS
INTRAVENOUS | Status: DC | PRN
  Administered 2011-11-12: 200 mg via INTRAVENOUS

## 2011-11-12 MED ORDER — 0.9 % SODIUM CHLORIDE (POUR BTL) OPTIME
TOPICAL | Status: DC | PRN
  Administered 2011-11-12: 1000 mL

## 2011-11-12 SURGICAL SUPPLY — 33 items
BAG SPEC THK2 15X12 ZIP CLS (MISCELLANEOUS) ×1
BAG ZIPLOCK 12X15 (MISCELLANEOUS) ×2 IMPLANT
BANDAGE ESMARK 6X9 LF (GAUZE/BANDAGES/DRESSINGS) ×1 IMPLANT
BANDAGE GAUZE ELAST BULKY 4 IN (GAUZE/BANDAGES/DRESSINGS) ×2 IMPLANT
BNDG CMPR 9X6 STRL LF SNTH (GAUZE/BANDAGES/DRESSINGS) ×1
BNDG COHESIVE 4X5 TAN STRL (GAUZE/BANDAGES/DRESSINGS) ×1 IMPLANT
BNDG ESMARK 6X9 LF (GAUZE/BANDAGES/DRESSINGS) ×2
CLOTH BEACON ORANGE TIMEOUT ST (SAFETY) ×2 IMPLANT
CUFF TOURN SGL QUICK 18 (TOURNIQUET CUFF) IMPLANT
CUFF TOURN SGL QUICK 24 (TOURNIQUET CUFF)
CUFF TOURN SGL QUICK 34 (TOURNIQUET CUFF) ×2
CUFF TRNQT CYL 24X4X40X1 (TOURNIQUET CUFF) IMPLANT
CUFF TRNQT CYL 34X4X40X1 (TOURNIQUET CUFF) IMPLANT
DRAIN PENROSE 18X1/2 LTX STRL (DRAIN) ×1 IMPLANT
DRSG PAD ABDOMINAL 8X10 ST (GAUZE/BANDAGES/DRESSINGS) ×4 IMPLANT
DURAPREP 26ML APPLICATOR (WOUND CARE) ×2 IMPLANT
ELECT REM PT RETURN 9FT ADLT (ELECTROSURGICAL) ×2
ELECTRODE REM PT RTRN 9FT ADLT (ELECTROSURGICAL) ×1 IMPLANT
GAUZE XEROFORM 2X2 STRL (GAUZE/BANDAGES/DRESSINGS) ×1 IMPLANT
GLOVE BIO SURGEON STRL SZ7.5 (GLOVE) ×2 IMPLANT
GOWN STRL REIN XL XLG (GOWN DISPOSABLE) ×2 IMPLANT
HANDPIECE INTERPULSE COAX TIP (DISPOSABLE) ×2
HOVERMATT SINGLE USE (MISCELLANEOUS) ×1 IMPLANT
KIT BASIN OR (CUSTOM PROCEDURE TRAY) ×2 IMPLANT
PACK LOWER EXTREMITY WL (CUSTOM PROCEDURE TRAY) ×2 IMPLANT
PAD CAST 4YDX4 CTTN HI CHSV (CAST SUPPLIES) ×1 IMPLANT
PADDING CAST COTTON 4X4 STRL (CAST SUPPLIES) ×2
POSITIONER SURGICAL ARM (MISCELLANEOUS) ×2 IMPLANT
SET HNDPC FAN SPRY TIP SCT (DISPOSABLE) ×1 IMPLANT
SPONGE GAUZE 4X4 12PLY (GAUZE/BANDAGES/DRESSINGS) ×2 IMPLANT
SUT ETHILON 2 0 PSLX (SUTURE) ×2 IMPLANT
SYR CONTROL 10ML LL (SYRINGE) ×2 IMPLANT
TOWEL OR 17X26 10 PK STRL BLUE (TOWEL DISPOSABLE) ×4 IMPLANT

## 2011-11-12 NOTE — Anesthesia Preprocedure Evaluation (Addendum)
Anesthesia Evaluation    Airway Mallampati: II TM Distance: >3 FB Neck ROM: Full    Dental No notable dental hx.    Pulmonary  breath sounds clear to auscultation  Pulmonary exam normal       Cardiovascular hypertension, Rhythm:Regular Rate:Normal  No meds.   Neuro/Psych    GI/Hepatic   Endo/Other  Diabetes mellitus-, Type obesityUncontrolled DM on no meds due to financial reasons. A!C 10.1  Renal/GU      Musculoskeletal   Abdominal   Peds  Hematology   Anesthesia Other Findings   Reproductive/Obstetrics                           Anesthesia Physical Anesthesia Plan  ASA: III  Anesthesia Plan: General   Post-op Pain Management:    Induction: Intravenous  Airway Management Planned: LMA  Additional Equipment:   Intra-op Plan:   Post-operative Plan: Extubation in OR  Informed Consent: I have reviewed the patients History and Physical, chart, labs and discussed the procedure including the risks, benefits and alternatives for the proposed anesthesia with the patient or authorized representative who has indicated his/her understanding and acceptance.   Dental advisory given  Plan Discussed with: CRNA  Anesthesia Plan Comments: (Uncontrolled diabetes and hypertension and morbid obesity. Short case, NPO 12 hours, will use LMA proseal and metoclopramide. Intubation if necessary.)     Anesthesia Quick Evaluation

## 2011-11-12 NOTE — Anesthesia Postprocedure Evaluation (Signed)
  Anesthesia Post-op Note  Patient: Karen Bennett  Procedure(s) Performed: Procedure(s) (LRB): IRRIGATION AND DEBRIDEMENT EXTREMITY (Left)  Patient Location: PACU  Anesthesia Type: General  Level of Consciousness: awake and alert   Airway and Oxygen Therapy: Patient Spontanous Breathing  Post-op Pain: mild  Post-op Assessment: Post-op Vital signs reviewed, Patient's Cardiovascular Status Stable, Respiratory Function Stable, Patent Airway and No signs of Nausea or vomiting  Post-op Vital Signs: stable  Complications: No apparent anesthesia complications

## 2011-11-12 NOTE — Progress Notes (Signed)
Patient ID: Karen Bennett, female   DOB: 17-Oct-1975, 36 y.o.   MRN: [id number] WBC much improved at 7.6.  Still reports a lot of pressure in her left foot and significant pain.  There is still drainage as well.  Plan: She is NPO.  We will proceed to the OR this am for an I&D of her left foot.  Consent has been obtained and she fully understands the need for surgery and the risks and benefits involved.

## 2011-11-12 NOTE — Brief Op Note (Signed)
11/10/2011 - 11/12/2011  11:08 AM  PATIENT:  Karen Bennett  36 y.o. female  PRE-OPERATIVE DIAGNOSIS:  infected left foot  POST-OPERATIVE DIAGNOSIS:  infected left foot  PROCEDURE:  Procedure(s) (LRB): IRRIGATION AND DEBRIDEMENT EXTREMITY (Left)  SURGEON:  Surgeon(s) and Role:    * Kathryne Hitch, MD - Primary  PHYSICIAN ASSISTANT:   ASSISTANTS: none   ANESTHESIA:   general  EBL:  Total I/O In: -  Out: 400 [Urine:400]  BLOOD ADMINISTERED:none  DRAINS: none   LOCAL MEDICATIONS USED:  NONE  SPECIMEN:  No Specimen  DISPOSITION OF SPECIMEN:  N/A  COUNTS:  YES  TOURNIQUET:   Total Tourniquet Time Documented: Calf (Left) - 18 minutes  DICTATION: .Other Dictation: Dictation Number [id number]  PLAN OF CARE: Admit to inpatient   PATIENT DISPOSITION:  PACU - hemodynamically stable.   Delay start of Pharmacological VTE agent (>24hrs) due to surgical blood loss or risk of bleeding: not applicable

## 2011-11-12 NOTE — Progress Notes (Signed)
OT Cancellation Note  Treatment cancelled today due to pt having surgery this morning. Will await new OT orders. Marland Kitchen  Alba Cory 11/12/2011, 9:59 AM

## 2011-11-12 NOTE — Transfer of Care (Signed)
Immediate Anesthesia Transfer of Care Note  Patient: Karen Bennett  Procedure(s) Performed: Procedure(s) (LRB): IRRIGATION AND DEBRIDEMENT EXTREMITY (Left)  Patient Location: PACU  Anesthesia Type: General  Level of Consciousness: awake, alert  and oriented  Airway & Oxygen Therapy: Patient Spontanous Breathing and Patient connected to face mask oxygen  Post-op Assessment: Report given to PACU RN and Post -op Vital signs reviewed and stable  Post vital signs: Reviewed and stable  Complications: No apparent anesthesia complications

## 2011-11-12 NOTE — Progress Notes (Signed)
Subjective:   Chart reviewed. Patient complains off 8/10 pain in her left foot. Denies dyspnea or chest pain or palpitations. Feels anxious and nervous about procedure.  Patient had extensive pulmonary embolism in 2009 in the context of being on oral contraceptives. She indicates she took Coumadin for 8-9 months and then discontinued voluntarily. There is no other history of VTE before or after that event. No family history of VTE.   Objective  Vital signs in last 24 hours: Filed Vitals:   11/12/11 0945 11/12/11 1101 11/12/11 1130 11/12/11 1148  BP: 124/79 135/69 112/59 108/54  Pulse: 65 58    Temp: 97.6 F (36.4 C) 97.1 F (36.2 C) 98 F (36.7 C) 98 F (36.7 C)  TempSrc: Oral     Resp: 18 15  15   Height:      Weight:      SpO2: 97% 100%  98%   Weight change:   Intake/Output Summary (Last 24 hours) at 11/12/11 1315 Last data filed at 11/12/11 1130  Gross per 24 hour  Intake    620 ml  Output    400 ml  Net    220 ml    Physical Exam:  General Exam: Morbidly obese lying comfortably supine in bed.  Respiratory System: Clear. No increased work of breathing.  Cardiovascular System: First and second heart sounds heard. Regular rate and rhythm. No JVD/murmurs.  Gastrointestinal System: Abdomen is non distended, soft and normal bowel sounds heard.  Central Nervous System: Alert and oriented. No focal neurological deficits. Extremities: Patient has approximately 3 mm area of swelling, redness, tenderness on the left lateral aspect of left foot with associated central ulceration that is oozing purulent material. Amputated left fourth and fifth toes. Symmetric 5 x 5 power.  Labs:  Basic Metabolic Panel:  Lab 11/12/11 1610 11/11/11 0352 11/10/11 1749  NA 133* 127* 133*  K 4.0 4.7 4.3  CL 100 95* 102  CO2 20 22 --  GLUCOSE 220* 269* 302*  BUN 11 10 10   CREATININE 0.69 0.60 0.70  CALCIUM 8.8 9.1 --  ALB -- -- --  PHOS -- -- --   Liver Function Tests: No results found for  this basename: AST:3,ALT:3,ALKPHOS:3,BILITOT:3,PROT:3,ALBUMIN:3 in the last 168 hours No results found for this basename: LIPASE:3,AMYLASE:3 in the last 168 hours No results found for this basename: AMMONIA:3 in the last 168 hours CBC:  Lab 11/12/11 0540 11/11/11 0352 11/10/11 1749 11/10/11 1740  WBC 7.6 10.6* -- 10.4  NEUTROABS -- -- -- 7.5  HGB 13.8 14.7 16.3* --  HCT 38.4 41.5 48.0* --  MCV 87.5 86.5 -- 86.6  PLT 184 252 -- 259   Cardiac Enzymes: No results found for this basename: CKTOTAL:5,CKMB:5,CKMBINDEX:5,TROPONINI:5 in the last 168 hours CBG:  Lab 11/12/11 1115 11/12/11 0747 11/11/11 2146 11/11/11 1824 11/11/11 1355  GLUCAP 197* 206* 206* 256* 230*    Iron Studies: No results found for this basename: IRON,TIBC,TRANSFERRIN,FERRITIN in the last 72 hours Studies/Results: Dg Foot Complete Left  11/10/2011  *RADIOLOGY REPORT*  Clinical Data: Lateral foot pain and swelling.  LEFT FOOT - COMPLETE 3+ VIEW  Comparison: 09/05/2011, plain films and CT  Findings: There is significant soft tissue swelling along the lateral aspect of the foot.  The patient has had previous amputations of the fourth and fifth metatarsals at the proximal aspects.  There is a small amount of soft tissue gas or defect lateral to the fifth metatarsal base.  Small bone density is identified adjacent to the base  of the third metatarsal, possibly representing the small bone fracture or sequestrum.  Findings may be related to Charcot joint.  IMPRESSION:  1.  Significant soft tissue swelling and soft tissue gas as described. 2.  Soft tissue swelling may be slightly increased. 3.  Possible fracture of the base of the third metatarsal versus Charcot changes/bony sequestrum.  Original Report Authenticated By: Patterson Hammersmith, M.D.   Medications:      . clindamycin (CLEOCIN) IV  600 mg Intravenous Q6H  . docusate sodium  100 mg Oral BID  . enoxaparin (LOVENOX) injection  180 mg Subcutaneous Q12H  . insulin aspart   0-20 Units Subcutaneous TID WC  . insulin glargine  10 Units Subcutaneous QHS  . senna  1 tablet Oral BID  . sodium chloride  3 mL Intravenous Q12H  . DISCONTD: enoxaparin (LOVENOX) injection  180 mg Subcutaneous Q12H  . DISCONTD: heparin  5,000 Units Subcutaneous Q8H    I  have reviewed scheduled and prn medications.     Problem/Plan: Principal Problem:  *Cellulitis Active Problems:  DM (diabetes mellitus), type 2, uncontrolled with complications  Obesity, Class III, BMI 40-49.9 (morbid obesity)  Hyponatremia  Diabetic foot ulcer  Leukocytosis  Pulmonary emboli  1. Infected left foot diabetic ulcer,? Abscess: Continue intravenous clindamycin. Orthopedic input appreciated and plan for surgical exploration and debridement later today. Patient reassured. 2. Uncontrolled type 2 diabetes mellitus: We'll increase Lantus to 15 units subcutaneous each bedtime. Continue sliding scale insulin. 3. Hyponatremia: May have an element of pseudohyponatremia secondary to hyperglycemia. Improved and stable. 4. History of pulmonary embolism in 2009 while on oral contraceptives. Has discontinued oral contraceptives. Currently asymptomatic. Will start Lovenox for DVT prophylaxis tomorrow as per all per recommendations. 5. Morbid obesity with BMI 60. Nutrition consulted. 6. Full code.  Discussed with patient and her best friend (at patient's request) at the bedside and updated care and answered questions.  [name] 11/12/2011,1:15 PM  LOS: 2 days

## 2011-11-12 NOTE — Progress Notes (Signed)
PT cancel note:  Noted PT evaluation order, however pt due for surgery today on L foot.  Will follow up tomorrow with pt.    Thanks,  Clovia Cuff, PT

## 2011-11-12 NOTE — Progress Notes (Signed)
Consult received for education due to morbid obesity with BMI of 60.  Pt just s/p surgery.  Will complete when appropriate.  Oran Rein, RD [phone]

## 2011-11-12 NOTE — Op Note (Signed)
Karen Bennett, WHOBREY NO.:  [id number]  MEDICAL RECORD NO.:  [id number]  LOCATION:  1307                         FACILITY:  [facility]  PHYSICIAN:  Vanita Panda. Magnus Ivan, M.D.DATE OF BIRTH:  08/08/1975  DATE OF PROCEDURE:  11/12/2011 DATE OF DISCHARGE:                              OPERATIVE REPORT   PREOPERATIVE DIAGNOSIS:  Left foot acute on chronic infected diabetic foot ulcer.  POSTOPERATIVE DIAGNOSIS:  Left foot acute on chronic infected diabetic foot ulcer.  PROCEDURE:  Irrigation and debridement of left foot diabetic foot ulcer of skin, soft tissue, and bone.  SURGEON:  Vanita Panda. Magnus Ivan, M.D.  ANESTHESIA:  General.  BLOOD LOSS:  Less than 50 mL.  COMPLICATIONS:  None.  INDICATIONS:  Ms. 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 Lajoyce Corners.  Around February she developed a significant callus on the plantar lateral aspect of her left foot and she started to have an open wound. She did not seek any other medical attention other than coming to the emergency room.  She then presented to the emergency room  this week after the wound opened up more and she developed purulent drainage with significant pain.  She had a white blood cell count of over 10000, which is high for her.  I saw her as a consultation yesterday and recommended she undergo irrigation debridement of the wound so we could assess the bone and soft tissues due to the gross purulence coming out of the wound.  She understands this and does wish to proceed with surgery.  DESCRIPTION OF PROCEDURE:  After informed consent was obtained, the appropriate left foot was marked.  She was brought to the operating room and placed supine on the operating table.  General anesthesia was then obtained.  A calf tourniquet was placed due to her morbid obesity.  Her left foot was prepped and draped with Betadine scrub and paint.   A time- out was called.  Sterile drapes were applied as well.  Time-out was called and she was identified as the correct patient, correct left foot. I then had the tourniquet inflated to 300 mm of pressure.  The about 1 cm size wound had gross purulence coming off this from the plantar lateral aspect of her foot.  I made a broad incision and ellipsed out the callus and necrotic tissue.  I dissected down to the bone and did not find soft bone or evidence of osteomyelitis.  I did rongeur the bone.  I did debride a little bit of the end of the bones to make sure. I debrided deep soft tissue with a rongeur and a #10 blade as well.  I then used 3 L of pulsatile lavage solution with normal saline to lavage the wound thoroughly.  I then reapproximated the skin edges with interrupted 2-0 nylon suture.  Xeroform followed with well-padded sterile dressing was applied.  She was awakened, extubated, and taken to recovery room in stable condition.  All final counts were correct and there were no complications noted.     Vanita Panda. Magnus Ivan, M.D.     CYB/MEDQ  D:  11/12/2011  T:  11/12/2011  Job:  [id number]

## 2011-11-12 NOTE — Progress Notes (Signed)
ANTICOAGULATION CONSULT NOTE - Initial Consult  Pharmacy Consult for Lovenox Indication: VTE prophylaxis  Patient Measurements: Height: 5\' 8"  (172.7 cm) Weight: 395 lb 11.2 oz (179.488 kg) IBW/kg (Calculated) : 63.9   Labs:  Basename 11/12/11 0540 11/11/11 0352 11/10/11 1749 11/10/11 1740  HGB 13.8 14.7 -- --  HCT 38.4 41.5 48.0* --  PLT 184 252 -- 259  APTT -- -- -- --  LABPROT -- -- -- --  INR -- -- -- --  HEPARINUNFRC -- -- -- --  CREATININE 0.69 0.60 0.70 --  CKTOTAL -- -- -- --  CKMB -- -- -- --  TROPONINI -- -- -- --   Estimated Creatinine Clearance: 172.2 ml/min (by C-G formula based on Cr of 0.69).  Medical History: Past Medical History  Diagnosis Date  . Cellulitis   . Diabetes mellitus   . PE (pulmonary embolism) ~2007-2008    Not on anticoagulation   Assessment: 35yo obese female s/p I&D of infected left foot.  Dr. Magnus Ivan performed I&D and wants VTE prophylaxis to be delayed until morning of 4/22.  Will begin Lovenox for VTE prophylaxis (obese dosing) in AM.  SCr 0.69, CrCl>100 ml/min, stable.   Goal of Therapy:  Anti-Xa level 0.2-0.4 IU/mL   Plan:  Lovenox 90 mg (0.5 mg/kg) q24h.  Start in AM. Follow up anti-Xa levels.  Clance Boll 11/12/2011,1:13 PM

## 2011-11-13 LAB — GLUCOSE, CAPILLARY: Glucose-Capillary: 222 mg/dL — ABNORMAL HIGH (ref 70–99)

## 2011-11-13 LAB — WOUND CULTURE

## 2011-11-13 MED ORDER — INSULIN GLARGINE 100 UNIT/ML ~~LOC~~ SOLN
25.0000 [IU] | Freq: Every day | SUBCUTANEOUS | Status: DC
  Administered 2011-11-13: 25 [IU] via SUBCUTANEOUS

## 2011-11-13 NOTE — Progress Notes (Signed)
OT Note:  Pt is awaiting surgery shoe is can weight bear only on heel.  She reports that she completed ADL herself this morning and has been able to walk to the bathroom without assistance.  She has a tub at home and will sponge bathe initially.  Pt does live alone but has support from her best friend.  Screened for OT.  [place], [place] [phone] 11/13/2011

## 2011-11-13 NOTE — Progress Notes (Signed)
Subjective:   Left foot pressure/pain is better. Denies any other complaints.  Objective  Vital signs in last 24 hours: Filed Vitals:   11/12/11 1445 11/12/11 2035 11/13/11 0453 11/13/11 1437  BP: 124/68 137/77 124/78 147/62  Pulse: 69 82 66 85  Temp: 98.8 F (37.1 C) 98.1 F (36.7 C) 98.4 F (36.9 C) 98.5 F (36.9 C)  TempSrc: Oral Oral Oral Oral  Resp: 20 19 18 20   Height:      Weight:      SpO2: 96% 98% 93% 96%   Weight change:   Intake/Output Summary (Last 24 hours) at 11/13/11 1636 Last data filed at 11/13/11 1437  Gross per 24 hour  Intake 2115.83 ml  Output    400 ml  Net 1715.83 ml    Physical Exam:  General Exam: Morbidly obese lying comfortably supine in bed.  Respiratory System: Clear. No increased work of breathing.  Cardiovascular System: First and second heart sounds heard. Regular rate and rhythm. No JVD/murmurs.  Gastrointestinal System: Abdomen is non distended, soft and normal bowel sounds heard.  Central Nervous System: Alert and oriented. No focal neurological deficits. Extremities: Left leg dressing is clean dry and intact. Color is normal. Good capillary refill and left toes.  Labs:  Basic Metabolic Panel:  Lab 11/12/11 1610 11/11/11 0352 11/10/11 1749  NA 133* 127* 133*  K 4.0 4.7 4.3  CL 100 95* 102  CO2 20 22 --  GLUCOSE 220* 269* 302*  BUN 11 10 10   CREATININE 0.69 0.60 0.70  CALCIUM 8.8 9.1 --  ALB -- -- --  PHOS -- -- --   Liver Function Tests: No results found for this basename: AST:3,ALT:3,ALKPHOS:3,BILITOT:3,PROT:3,ALBUMIN:3 in the last 168 hours No results found for this basename: LIPASE:3,AMYLASE:3 in the last 168 hours No results found for this basename: AMMONIA:3 in the last 168 hours CBC:  Lab 11/12/11 0540 11/11/11 0352 11/10/11 1749 11/10/11 1740  WBC 7.6 10.6* -- 10.4  NEUTROABS -- -- -- 7.5  HGB 13.8 14.7 16.3* --  HCT 38.4 41.5 48.0* --  MCV 87.5 86.5 -- 86.6  PLT 184 252 -- 259   Cardiac Enzymes: No  results found for this basename: CKTOTAL:5,CKMB:5,CKMBINDEX:5,TROPONINI:5 in the last 168 hours CBG:  Lab 11/13/11 1237 11/13/11 0734 11/12/11 2103 11/12/11 1756 11/12/11 1115  GLUCAP 185* 248* 236* 223* 197*    Iron Studies: No results found for this basename: IRON,TIBC,TRANSFERRIN,FERRITIN in the last 72 hours Studies/Results: No results found. Medications:      . clindamycin (CLEOCIN) IV  600 mg Intravenous Q6H  . docusate sodium  100 mg Oral BID  . enoxaparin (LOVENOX) injection  0.5 mg/kg Subcutaneous Q24H  . insulin aspart  0-20 Units Subcutaneous TID WC  . insulin glargine  15 Units Subcutaneous QHS  . senna  1 tablet Oral BID  . sodium chloride  3 mL Intravenous Q12H    I  have reviewed scheduled and prn medications.     Problem/Plan: Principal Problem:  *Cellulitis Active Problems:  DM (diabetes mellitus), type 2, uncontrolled with complications  Obesity, Class III, BMI 40-49.9 (morbid obesity)  Hyponatremia  Diabetic foot ulcer  Leukocytosis  Pulmonary emboli  1. Left foot acute on chronic infected diabetic foot ulcer, status post I&D on 4/21: Orthopedic input appreciated. Continue IV antibiotics for another day and possible discharge tomorrow on oral doxycycline. 2. Uncontrolled type 2 diabetes mellitus: Issues with noncompliance at home. Patient indicates that she took her 70/30 insulin for 2 weeks and then  could not afford it and has not been on any insulins for approximately a month. Case manager consulted to assist. 3. Hyponatremia: May have an element of pseudohyponatremia secondary to hyperglycemia. Improved and stable. 4. History of pulmonary embolism in 2009 while on oral contraceptives. Has discontinued oral contraceptives. Currently asymptomatic. On Lovenox DVT prophylaxis. 5. Morbid obesity with BMI 60. Nutrition consulted. 6. Full code.  Disposition: Possible discharge home on 4/23.  Karen Bennett 11/13/2011,4:36 PM  LOS: 3 days

## 2011-11-13 NOTE — Progress Notes (Signed)
Patient ID: Karen Bennett, female   DOB: 1976/02/25, 36 y.o.   MRN: [id number] Patient reports left foot pain post-op to be expected.  She has been up on her foot some.  The dressing has scant drainage on it.  My plan would be to have her on IV antibiotics today and then let her go home tomorrow on oral antibiotics such as doxycycline 100 mg twice daily for 2 weeks.  I'll change her dressing myself either tonight or tomorrow am.

## 2011-11-13 NOTE — Progress Notes (Signed)
PT Cancellation Note  Treatment cancelled today due to pt has been up in room walking on heel and actually has used a darco shoe before when she has her toes amputated.  She has a RW at home if she needs it  Pt advised to use it out of doors for extra balance and to make sure she wears a shoe on right foot. Pt does not think she needs PT as she is walking OK.  Attempted multiple times to see pt today and was not successful for various reasons.  Will check back in AM prior to D/C to see if any problems or questions arise.Manson Passey, Cherylann Parr 11/13/2011, 3:40 PM

## 2011-11-13 NOTE — Progress Notes (Signed)
Inpatient Diabetes Program Recommendations  AACE/ADA: New Consensus Statement on Inpatient Glycemic Control (2009)  Target Ranges:  Prepandial:   less than 140 mg/dL      Peak postprandial:   less than 180 mg/dL (1-2 hours)      Critically ill patients:  140 - 180 mg/dL   Reason for Visit: Hyperglycemia No documentation of dosage amounts of 70/30 insulin at home.  Inpatient Diabetes Program Recommendations Insulin - Basal: Pt at 180 kg.  Pt needs at least 30 units lantus to start. Hx states pt takes 70/30 at home, but there is no documentation of doses.  Correction (SSI): Add meal coverage per below and decrease correction to moderate tidwc and add HS correction. Insulin - Meal Coverage: Please add 6 units novolog tidwc  Note: Will talk with patient as soon as possible to help determine insulin dose needs and home doses. Thank you, Lenor Coffin, RN, CNS, Diabetes Coordinator [phone])

## 2011-11-13 NOTE — Progress Notes (Signed)
Referral received for SNF per team meeting, pt plans to D/C home. CSW will follow up with the financial counselor and consult with medicaid. The purpose is to assist the pt with following up on obtaining services through [facility] for transportation based on Medical need. CSW will sign off, please re consult of CSW needs arise.   Kayleen Memos. Leighton Ruff [phone]

## 2011-11-13 NOTE — Consult Note (Signed)
WOC consult Note Reason for Consult: Left foot, DFU Patient consult received from Dr. Waymon Amato upon admission; noted that Dr. Magnus Ivan has already seen and performed a surgical procedure.  He is following.  I did not see.  I will not follow.  Please re-consult if needed. Thanks, Ladona Mow, MSN, RN, [facility], CWOCN [phone])

## 2011-11-13 NOTE — Progress Notes (Signed)
Clinical Social Work Department BRIEF PSYCHOSOCIAL ASSESSMENT 11/13/2011  Patient:  Karen Bennett, Karen Bennett     Account Number:  [id number]     Admit date:  11/10/2011  Clinical Social Worker:  Tommi Emery, CLINICAL SOCIAL WORKER  Date/Time:  11/13/2011 10:33 AM  Referred by:  Physician  Date Referred:  11/13/2011 Referred for  Other - See comment   Other Referral:   Pt may have some financial need based on her Medicad status. The pt informed that she has sumbitted several application and has been denied servies becuase she is single and does not have any kids. The pt has some issues with mobility related to her foot injury and weight. Follow up is being done with [employer] o assist the pt with getting transporation assistance or finding out why she has been denied, if possible   Interview type:  Patient Other interview type:    PSYCHOSOCIAL DATA Living Status:  ALONE Admitted from facility:   Level of care:   Primary support name:   Primary support relationship to patient:   Degree of support available:   The pt has a set of colaterals that she can depend. However, she is not in contact with immediate family and expressed being distant.    CURRENT CONCERNS Current Concerns  Financial Resources  Other - See comment   Other Concerns:   Transportation due to Phsycial ability    SOCIAL WORK ASSESSMENT / PLAN The pt seems to needs assistance with tranportation to medical appotinments to maintain medical compliance based on her physical injury and low level of modbility.   Assessment/plan status:  Other - See comment Other assessment/ plan:   To follow up with Medicaid and financial services and reconsult about why the pt is denied Medicaid   Information/referral to community resources:    PATIENT'S/FAMILY'S RESPONSE TO PLAN OF CARE: the pt is appreciative of the helps.        Kayleen Memos. Leighton Ruff [phone]

## 2011-11-13 NOTE — Plan of Care (Addendum)
Problem: Food- and Nutrition-Related Knowledge Deficit (NB-1.1) Goal: Nutrition education Formal process to instruct or train a patient/client in a skill or to impart knowledge to help patients/clients voluntarily manage or modify food choices and eating behavior to maintain or improve health.  Outcome: Completed/Met Date Met:  11/13/11 Met with pt who reports being on food stamps at home, having $70/week for food. Pt reports being very aware of healthy food choices for diabetics and was able to provide correct examples of healthy food choices. Pt reports eating 1-2 meals/day r/t being on a limited income. Pt frustrated with foods you can buy on food stamps saying "they set you up to fail". Pt reports drinking diet sodas/water. Discussed importance of small frequent meals to increase metabolism. Pt became tearful r/t frustration over multiple admissions despite pt eating correctly. Pt reports 100 pound weight loss in the past 2 years r/t watching portion sizes and exercising. Pt reports she likes to dance as her exercise, and tries to do this 2-3 times/week. Provided handouts on weight loss and healthy eating with food stamps. Pt grateful for visit. Education needs addressed.

## 2011-11-14 LAB — GLUCOSE, CAPILLARY
Glucose-Capillary: 211 mg/dL — ABNORMAL HIGH (ref 70–99)
Glucose-Capillary: 210 mg/dL — ABNORMAL HIGH (ref 70–99)

## 2011-11-14 MED ORDER — OXYCODONE HCL 5 MG PO TABS: 5.0000 mg | ORAL_TABLET | Freq: Four times a day (QID) | ORAL | Status: AC | PRN

## 2011-11-14 MED ORDER — CIPROFLOXACIN HCL 500 MG PO TABS: 500.0000 mg | ORAL_TABLET | Freq: Two times a day (BID) | ORAL | Status: DC

## 2011-11-14 MED ORDER — CIPROFLOXACIN HCL 500 MG PO TABS: 500.0000 mg | ORAL_TABLET | Freq: Two times a day (BID) | ORAL | Status: AC

## 2011-11-14 MED ORDER — INSULIN NPH ISOPHANE & REGULAR (70-30) 100 UNIT/ML ~~LOC~~ SUSP: 25.0000 [IU] | Freq: Two times a day (BID) | SUBCUTANEOUS | Status: DC

## 2011-11-14 MED ORDER — SULFAMETHOXAZOLE-TRIMETHOPRIM 800-160 MG PO TABS: 1.0000 | ORAL_TABLET | Freq: Two times a day (BID) | ORAL | Status: AC

## 2011-11-14 MED ORDER — SULFAMETHOXAZOLE-TRIMETHOPRIM 800-160 MG PO TABS: 1.0000 | ORAL_TABLET | Freq: Two times a day (BID) | ORAL | Status: DC

## 2011-11-14 NOTE — Progress Notes (Signed)
PT Cancellation Note  Treatment cancelled today due to pt states she is independent in walking and does not need PT.  Karen Bennett 11/14/2011, 10:58 AM

## 2011-11-14 NOTE — Progress Notes (Signed)
Pt D/C home, stable, pain improved. Pt will follow up out patient with Dr Magnus Ivan in one week. Social work is working with pt to get medication assistance. Pt has no new complains.vitals stable for Pts normal value. On room air. D/C education done. Medication administration done. Pt verbalizes understanding. RN re- emphasis  teaching on diabetes management. And insulin administration.

## 2011-11-14 NOTE — Discharge Summary (Signed)
Discharge Summary  Karen Bennett MR#: [id number]  DOB:05/06/1976  Date of Admission: 11/10/2011 Date of Discharge: 11/14/2011  Patient's PCP: No primary provider on file.  Attending Physician:Breyon Blass  Consults: 1.  Orthopedics: Dr. Doneen Poisson  Discharge Diagnoses: Principal Problem:  *Cellulitis Active Problems:  DM (diabetes mellitus), type 2, uncontrolled with complications  Obesity, Class III, BMI 40-49.9 (morbid obesity)  Hyponatremia  Diabetic foot ulcer  Leukocytosis  Pulmonary emboli  noncompliance  Brief Admitting History and Physical 35yoF with h/o uncontrolled DM and left foot DM ulceration, s/p 4th and 5th metatarsal amputations, and recent admissions for DM ulceration and cellulitis now presented with acute left foot swelling and pain, possible 3rd metatarsal fracture vs Charcot changes. She has had left foot wound since February of this year. 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 Lajoyce Corners. She has not followed up with him since. This past Wednesday, the left foot wound opened up and began draining more. She developed worsening pain and swelling and was admitted.      Discharge Medications Current Discharge Medication List    START taking these medications   Details  ciprofloxacin (CIPRO) 500 MG tablet Take 1 tablet (500 mg total) by mouth 2 (two) times daily. Qty: 28 tablet, Refills: 0    insulin NPH-insulin regular (NOVOLIN 70/30) (70-30) 100 UNIT/ML injection Inject 25 Units into the skin 2 (two) times daily with a meal. Qty: 10 mL, Refills: 0    oxyCODONE (OXY IR/ROXICODONE) 5 MG immediate release tablet Take 1-2 tablets (5-10 mg total) by mouth every 6 (six) hours as needed for pain. Qty: 30 tablet, Refills: 0    sulfamethoxazole-trimethoprim (BACTRIM DS) 800-160 MG per tablet Take 1 tablet by mouth 2 (two) times daily. Qty: 28 tablet, Refills: 0      CONTINUE these medications which have NOT  CHANGED   Details  ibuprofen (ADVIL,MOTRIN) 200 MG tablet Take 400 mg by mouth every 6 (six) hours as needed. pain        Hospital Course: Cellulitis Present on Admission:  .DM (diabetes mellitus), type 2, uncontrolled with complications .Diabetic foot ulcer .Foot pain, left .Hyponatremia .Obesity, Class III, BMI 40-49.9 (morbid obesity) .Cellulitis .HYPERLIPIDEMIA .Leukocytosis .Pulmonary emboli   1. Left foot acute on chronic infected diabetic foot ulcer, status post I&D on 4/21: Wound culture shows Escherichia coli and MSSA. Patient will be discharged on oral Cipro and Bactrim for 2 weeks. She is to followup with orthopedic M.D. in a week from hospital discharge. 2. Uncontrolled type 2 diabetes mellitus: Issues with noncompliance at home. Patient indicates that she took her 70/30 insulin for 2 weeks and then could not afford it and has not been on any insulins for approximately a month. Patient was provided insulin and antibiotics through the indigent funds, again. Please see care management notes for details. Patient indicates that she has been declined for Medicaid services multiple times. She has been referred to health serv but refuses to go because she does not have transport. She says she cannot afford medications even at the $4 services through [place] or other retail pharmacies. She also indicates that she does not have family or friends who can assist her. It is an extremely difficult situation. Patient counseled repeatedly regarding compliance with medications, diet and M.D. followups. Unclear if this will happen. 3. Hyponatremia: May have an element of pseudohyponatremia secondary to hyperglycemia. Improved and stable. 4. History of pulmonary embolism in 2009 while on oral contraceptives.  Has discontinued oral contraceptives. Currently asymptomatic. Was on Lovenox DVT prophylaxis. 5. Morbid obesity with BMI 60. Nutrition consulted. 6. Full code. 7.   Day of  Discharge Improving pain in the left foot and able to stand up and ambulate to the bathroom by herself with a special boot on the left leg. BP 126/72  Pulse 74  Temp(Src) 98.2 F (36.8 C) (Oral)  Resp 18  Ht 5\' 8"  (1.727 m)  Wt 179.488 kg (395 lb 11.2 oz)  BMI 60.17 kg/m2  SpO2 97%  LMP 10/19/2011 General Exam: Morbidly obese lying comfortably supine in bed.  Respiratory System: Clear. No increased work of breathing.  Cardiovascular System: First and second heart sounds heard. Regular rate and rhythm. No JVD/murmurs.  Gastrointestinal System: Abdomen is non distended, soft and normal bowel sounds heard.  Central Nervous System: Alert and oriented. No focal neurological deficits.  Extremities: Left leg dressing is clean dry and intact. Color is normal. Good capillary refill and left toes.  Basic Metabolic Panel:  Lab 11/12/11 1610 11/11/11 0352 11/10/11 1749  NA 133* 127* 133*  K 4.0 4.7 4.3  CL 100 95* 102  CO2 20 22 --  GLUCOSE 220* 269* 302*  BUN 11 10 10   CREATININE 0.69 0.60 0.70  CALCIUM 8.8 9.1 --  ALB -- -- --  PHOS -- -- --   Liver Function Tests: No results found for this basename: AST:3,ALT:3,ALKPHOS:3,BILITOT:3,PROT:3,ALBUMIN:3 in the last 168 hours No results found for this basename: LIPASE:3,AMYLASE:3 in the last 168 hours No results found for this basename: AMMONIA:3 in the last 168 hours CBC:  Lab 11/12/11 0540 11/11/11 0352 11/10/11 1749 11/10/11 1740  WBC 7.6 10.6* -- 10.4  NEUTROABS -- -- -- 7.5  HGB 13.8 14.7 16.3* --  HCT 38.4 41.5 48.0* --  MCV 87.5 86.5 -- 86.6  PLT 184 252 -- 259   Cardiac Enzymes: No results found for this basename: CKTOTAL:5,CKMB:5,CKMBINDEX:5,TROPONINI:5 in the last 168 hours CBG:  Lab 11/14/11 1224 11/14/11 0739 11/13/11 2225 11/13/11 1716 11/13/11 1237  GLUCAP 210* 211* 210* 222* 185*     Dg Foot Complete Left  11/10/2011  *RADIOLOGY REPORT*  Clinical Data: Lateral foot pain and swelling.  LEFT FOOT - COMPLETE 3+  VIEW  Comparison: 09/05/2011, plain films and CT  Findings: There is significant soft tissue swelling along the lateral aspect of the foot.  The patient has had previous amputations of the fourth and fifth metatarsals at the proximal aspects.  There is a small amount of soft tissue gas or defect lateral to the fifth metatarsal base.  Small bone density is identified adjacent to the base of the third metatarsal, possibly representing the small bone fracture or sequestrum.  Findings may be related to Charcot joint.  IMPRESSION:  1.  Significant soft tissue swelling and soft tissue gas as described. 2.  Soft tissue swelling may be slightly increased. 3.  Possible fracture of the base of the third metatarsal versus Charcot changes/bony sequestrum.  Original Report Authenticated By: Patterson Hammersmith, M.D.     Disposition: Discharged home in stable condition.  Diet: Heart healthy and diabetic  Activity: Increase activity gradually   Follow-up Appts: Discharge Orders    Future Orders Please Complete By Expires   Diet - low sodium heart healthy      Diet Carb Modified      Increase activity slowly      Call MD for:  temperature >100.4      Call MD for:  severe uncontrolled pain      Call MD for:  redness, tenderness, or signs of infection (pain, swelling, redness, odor or green/yellow discharge around incision site)      Discharge wound care:      Comments:   Followup with orthopedic M.D. next week for dressing change. Keep dressing clean and dry.      TESTS THAT NEED FOLLOW-UP None  Time spent on discharge, talking to the patient, and coordinating care: 45 mins.   SignedMarcellus Scott, MD 11/14/2011, 8:37 PM

## 2011-11-14 NOTE — Progress Notes (Signed)
Patient ID: Karen Bennett, female   DOB: 20-Jul-1976, 36 y.o.   MRN: [id number] I changed the dressing on Karen Bennett's left foot and her incision looks good.  A new dressing was applied.  She can be discharged to home form my standpoint on oral antibiotics with follow-up in my office next week.  i will leave instructions in the discharge instructions section of EPIC.

## 2011-11-14 NOTE — Progress Notes (Signed)
CARE MANAGEMENT NOTE 11/14/2011  Patient:  Karen Bennett, Karen Bennett   Account Number:  [id number]  Date Initiated:  11/14/2011  Documentation initiated by:  Bryer Cozzolino  Subjective/Objective Assessment:   36 yo female admitted 11/10/11 foot pain and infected left foot     Action/Plan:   D/C when medically stable   Anticipated DC Date:  11/14/2011   Anticipated DC Plan:  HOME/SELF CARE  In-house referral  Clinical Social Worker      DC Planning Services  CM consult  Medication Assistance                 Status of service:  Completed, signed off    Discharge Disposition:  HOME/SELF CARE  Comments:  11/14/11, Kathi Der, RNC-MNN, BSN, [phone], CM received referral for medication assistance.  Per Riki Rusk in pharmacy, pt received indigent funds for medication assistance on 08/17/11, therefore not eligible for medication assistance.  Pt states that she will not be able to afford prescriptions until Friday when she gets paid. Dr. Waymon Amato requested to check with supervisor for medication assistance, otherwise pt will have to stay until Friday.  Multiple resources have been given to pt for transportation, physician follow up, and medications.  CSW currently working with pt as well.  CM called [facility] and she states they have tried to work with pt to set up appointments, etc. to no avail.  CM received approval from Genella Rife, Assistant Director, for insulin and antibiotics.  Pt aware we will be unable to provide medciation assistance again within the designated time frame for indigent funds.  Pt given Medicaid transportation phone number, [phone].

## 2011-11-14 NOTE — Discharge Instructions (Signed)
Try to keep pressure off of the out-side/lateral part of your foot with weight primarily thru your heel. Wait until this Friday before getting your incision wet in the shower. Call [facility] [phone] for a follow-up appointment next week.

## 2011-11-20 ENCOUNTER — Encounter (HOSPITAL_COMMUNITY): Payer: Self-pay | Admitting: Orthopaedic Surgery

## 2011-11-21 ENCOUNTER — Emergency Department (HOSPITAL_COMMUNITY)
Admission: EM | Admit: 2011-11-21 | Discharge: 2011-11-22 | Disposition: A | Discharge status: Home / Self Care | Payer: Self-pay | Attending: Emergency Medicine | Admitting: Emergency Medicine

## 2011-11-21 ENCOUNTER — Encounter (HOSPITAL_COMMUNITY): Payer: Self-pay | Admitting: Family Medicine

## 2011-11-21 DIAGNOSIS — G8918 Other acute postprocedural pain: Secondary | ICD-10-CM | POA: Insufficient documentation

## 2011-11-21 DIAGNOSIS — Z79899 Other long term (current) drug therapy: Secondary | ICD-10-CM | POA: Insufficient documentation

## 2011-11-21 DIAGNOSIS — Z794 Long term (current) use of insulin: Secondary | ICD-10-CM | POA: Insufficient documentation

## 2011-11-21 DIAGNOSIS — L7682 Other postprocedural complications of skin and subcutaneous tissue: Secondary | ICD-10-CM

## 2011-11-21 DIAGNOSIS — Z86711 Personal history of pulmonary embolism: Secondary | ICD-10-CM | POA: Insufficient documentation

## 2011-11-21 DIAGNOSIS — E119 Type 2 diabetes mellitus without complications: Secondary | ICD-10-CM | POA: Insufficient documentation

## 2011-11-21 DIAGNOSIS — M7989 Other specified soft tissue disorders: Secondary | ICD-10-CM | POA: Insufficient documentation

## 2011-11-21 MED ORDER — PROMETHAZINE HCL 25 MG/ML IJ SOLN
12.5000 mg | Freq: Once | INTRAMUSCULAR | Status: AC
  Administered 2011-11-22: 12.5 mg via INTRAVENOUS
  Filled 2011-11-21: qty 1

## 2011-11-21 MED ORDER — HYDROMORPHONE HCL PF 1 MG/ML IJ SOLN
1.0000 mg | Freq: Once | INTRAMUSCULAR | Status: AC
  Administered 2011-11-22: 1 mg via INTRAVENOUS
  Filled 2011-11-21: qty 1

## 2011-11-21 NOTE — ED Notes (Signed)
Patient states that she has surgery on her foot 2 weeks ago. Presents now with blackened area to left foot with drainage.

## 2011-11-22 LAB — BASIC METABOLIC PANEL
Calcium: 9.3 mg/dL (ref 8.4–10.5)
GFR calc Af Amer: >90 mL/min (ref >90)
GFR calc non Af Amer: >90 mL/min (ref >90)
Sodium: 134 mEq/L — ABNORMAL LOW (ref 135–145)

## 2011-11-22 LAB — CBC
MCH: 30.2 pg (ref 26.0–34.0)
MCHC: 35.3 g/dL (ref 30.0–36.0)
Platelets: 301 10*3/uL (ref 150–400)
RBC: 5.07 MIL/uL (ref 3.87–5.11)
RDW: 12.7 % (ref 11.5–15.5)

## 2011-11-22 LAB — GLUCOSE, CAPILLARY: Glucose-Capillary: 185 mg/dL — ABNORMAL HIGH (ref 70–99)

## 2011-11-22 NOTE — ED Provider Notes (Signed)
  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 She had a recent surgery, there is no postop complications or fevers or wound infections. She is gradually developed a site of dark colored skin within the wound. There is no drainage, purulence, fevers.  Physical exam:  Well appearing, no acute distress, left lower extremity with postoperative wound, there is no dehiscence of the wound, sutures are in place, there is a small amount of eschar in the middle of the wound, no drainage, no foul smelling purulent material, no serosanguineous drainage. There is no surrounding redness or warmth of the foot.  Assessment:  Postoperatively the foot like it is healing appropriately, there is no hard signs of infection, patient cautioned to check her blood sugars daily, she does not have any test strips at home, encouraged to do this to maintain good control of her blood sugars. Will followup at her postop check on the 14th of this month. She has been explained the indications for return including infection  Medical screening examination/treatment/procedure(s) were conducted as a shared visit with non-physician practitioner(s) and myself.  I personally evaluated the patient during the encounter   Vida Roller, MD 11/22/11 [phone]

## 2011-11-22 NOTE — ED Provider Notes (Signed)
History     CSN: [id number]  Arrival date & time 11/21/11  4098   First MD Initiated Contact with Patient 11/21/11 2239      Chief Complaint  Patient presents with  . Foot Pain    (Consider location/radiation/quality/duration/timing/severity/associated sxs/prior treatment) HPI  Patient presents to the emergency department with complaints of left foot-appearing black. She had surgery done on her foot approximately 9 days ago by Dr. Rayburn Ma. The patient had previously had her fourth and fifth toes amputated on that foot but she got a bladder infection and he required debridement. The patient is a diabetic and she does not check her sugars on a daily basis do to being unable to afford her test strips. She denies having fevers, nausea, chills, weakness or any other associated symptoms. She admits to having severe pain to the left foot. He states that she looked at her foot today and noted that there was a black spot was concerned and therefore she came in.  Past Medical History  Diagnosis Date  . Cellulitis   . Diabetes mellitus   . PE (pulmonary embolism) ~2007-2008    Not on anticoagulation    Past Surgical History  Procedure Date  . 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 Surgery to remove hematoma in l leg   . Abscess removal from l groin   . I&d extremity 11/12/2011    Procedure: IRRIGATION AND DEBRIDEMENT EXTREMITY;  Surgeon: Kathryne Hitch, MD;  Location: [facility];  Service: Orthopedics;  Laterality: Left;  foot left    Family History  Problem Relation Age of Onset  . Diabetes Mother   . Coronary artery disease Mother     History  Substance Use Topics  . Smoking status: Never Smoker   . Smokeless tobacco: Never Used  . Alcohol Use: No    OB History    Grav Para Term Preterm Abortions TAB SAB Ect Mult Living                  Review of Systems  ROS   HEENT: denies blurry vision or change in hearing PULMONARY: Denies difficulty breathing and  SOB CARDIAC: denies chest pain or heart palpitations MUSCULOSKELETAL:  denies being unable to ambulate ABDOMEN AL: denies abdominal pain GU: denies loss of bowel or urinary control NEURO: denies numbness and tingling in extremities     Allergies  Zofran; Doxycycline; and Morphine and related  Home Medications   Current Outpatient Rx  Name Route Sig Dispense Refill  . CIPROFLOXACIN HCL 500 MG PO TABS Oral Take 1 tablet (500 mg total) by mouth 2 (two) times daily. 28 tablet 0  . IBUPROFEN 200 MG PO TABS Oral Take 400 mg by mouth every 6 (six) hours as needed. pain    . INSULIN ISOPHANE & REGULAR (70-30) 100 UNIT/ML Lamoni SUSP Subcutaneous Inject 25 Units into the skin 2 (two) times daily with a meal. 10 mL 0  . OXYCODONE HCL 5 MG PO TABS Oral Take 1-2 tablets (5-10 mg total) by mouth every 6 (six) hours as needed for pain. 30 tablet 0  . SULFAMETHOXAZOLE-TRIMETHOPRIM 800-160 MG PO TABS Oral Take 1 tablet by mouth 2 (two) times daily. 28 tablet 0    BP 129/82  Pulse 69  Temp(Src) 98.3 F (36.8 C) (Oral)  Resp 20  Wt 413 lb (187.336 kg)  SpO2 95%  LMP 10/19/2011  Physical Exam  Nursing note and vitals reviewed. Constitutional: She appears  well-developed and well-nourished. No distress.       Morbidly obese  HENT:  Head: Normocephalic and atraumatic.  Eyes: Pupils are equal, round, and reactive to light.  Neck: Normal range of motion. Neck supple.  Cardiovascular: Normal rate and regular rhythm.   Pulmonary/Chest: Effort normal.  Abdominal: Soft.  Musculoskeletal:       Left foot: She exhibits tenderness and swelling.       Feet:       pts 4th and 5th toe digits surgically absent. The patient has all of her sutures in place and wound is healing. No puss or surrounding cellulitis, induration or erythema noted. Pt has blood collection under skin which appears dark in color but not necrotic.  Neurological: She is alert.  Skin: Skin is warm and dry.    ED Course  Procedures  (including critical care time)  Labs Reviewed  CBC - Abnormal; Notable for the following:    Hemoglobin 15.3 (*)    All other components within normal limits  BASIC METABOLIC PANEL - Abnormal; Notable for the following:    Sodium 134 (*)    Glucose, Bld 205 (*)    All other components within normal limits  GLUCOSE, CAPILLARY - Abnormal; Notable for the following:    Glucose-Capillary 185 (*)    All other components within normal limits   No results found.   1. Pain at surgical incision       MDM  Dr. Hyacinth Meeker has evaluated patient as well and agrees with my findings at the patient's left foot findings are appropriate healing post surgery. Do not see any red flags or alarming symptoms that would suggest necrotic tissue or worsening infection. The patient is to followup with Dr. Magnus Ivan within the next week. Her sugars were checked while in the ED and they were 158. The patient admits to taking her insulin as directed. The patient has been advised to return to the ED if her symptoms change or worsen.        Dorthula Matas, PA 11/22/11 [phone]

## 2011-11-22 NOTE — Discharge Instructions (Signed)
Paresthesia Paresthesia is an abnormal burning or prickling sensation. This sensation is generally felt in the hands, arms, legs, or feet. However, it may occur in any part of the body. It is usually not painful. The feeling may be described as:  Tingling or numbness.   "Pins and needles."   Skin crawling.   Buzzing.   Limbs "falling asleep."   Itching.  Most people experience temporary (transient) paresthesia at some time in their lives. CAUSES  Paresthesia may occur when you breathe too quickly (hyperventilation). It can also occur without any apparent cause. Commonly, paresthesia occurs when pressure is placed on a nerve. The feeling quickly goes away once the pressure is removed. For some people, however, paresthesia is a long-lasting (chronic) condition caused by an underlying disorder. The underlying disorder may be:  A traumatic, direct injury to nerves. Examples include a:   Broken (fractured) neck.   Fractured skull.   A disorder affecting the brain and spinal cord (central nervous system). Examples include:   Transverse myelitis.   Encephalitis.   Transient ischemic attack.   Multiple sclerosis.   Stroke.   Tumor or blood vessel problems, such as an arteriovenous malformation pressing against the brain or spinal cord.   A condition that damages the peripheral nerves (peripheral neuropathy). Peripheral nerves are not part of the brain and spinal cord. These conditions include:   Diabetes.   Peripheral vascular disease.   Nerve entrapment syndromes, such as carpal tunnel syndrome.   Shingles.   Hypothyroidism.   Vitamin B12 deficiencies.   Alcoholism.   Heavy metal poisoning (lead, arsenic).   Rheumatoid arthritis.   Systemic lupus erythematosus.  DIAGNOSIS  Your caregiver will attempt to find the underlying cause of your paresthesia. Your caregiver may:  Take your medical history.   Perform a physical exam.   Order various lab tests.    Order imaging tests.  TREATMENT  Treatment for paresthesia depends on the underlying cause. HOME CARE INSTRUCTIONS  Avoid drinking alcohol.   You may consider massage or acupuncture to help relieve your symptoms.   Keep all follow-up appointments as directed by your caregiver.  SEEK IMMEDIATE MEDICAL CARE IF:   You feel weak.   You have trouble walking or moving.   You have problems with speech or vision.   You feel confused.   You cannot control your bladder or bowel movements.   You feel numbness after an injury.   You faint.   Your burning or prickling feeling gets worse when walking.   You have pain, cramps, or dizziness.   You develop a rash.  MAKE SURE YOU:  Understand these instructions.   Will watch your condition.   Will get help right away if you are not doing well or get worse.  Document Released: 06/30/2002 Document Revised: 06/29/2011 Document Reviewed: 03/31/2011 [facility] Patient Information 2012 [place], [place].Neuropathic Pain We often think that pain has a physical cause. If we get rid of the cause, the pain should go away. Nerves themselves can also cause pain. It is called neuropathic pain, which means nerve abnormality. It may be difficult for the patients who have it and for the treating caregivers. Pain is usually described as acute (short-lived) or chronic (long-lasting). Acute pain is related to the physical sensations caused by an injury. It can last from a few seconds to many weeks, but it usually goes away when normal healing occurs. Chronic pain lasts beyond the typical healing time. With neuropathic pain, the nerve fibers themselves  may be damaged or injured. They then send incorrect signals to other pain centers. The pain you feel is real, but the cause is not easy to find.  CAUSES  Chronic pain can result from diseases, such as diabetes and shingles (an infection related to chickenpox), or from trauma, surgery, or amputation. It can also  happen without any known injury or disease. The nerves are sending pain messages, even though there is no identifiable cause for such messages.   Other common causes of neuropathy include diabetes, phantom limb pain, or Regional Pain Syndrome (RPS).   As with all forms of chronic back pain, if neuropathy is not correctly treated, there can be a number of associated problems that lead to a downward cycle for the patient. These include depression, sleeplessness, feelings of fear and anxiety, limited social interaction and inability to do normal daily activities or work.   The most dramatic and mysterious example of neuropathic pain is called "phantom limb syndrome." This occurs when an arm or a leg has been removed because of illness or injury. The brain still gets pain messages from the nerves that originally carried impulses from the missing limb. These nerves now seem to misfire and cause troubling pain.   Neuropathic pain often seems to have no cause. It responds poorly to standard pain treatment.  Neuropathic pain can occur after:  Shingles (herpes zoster virus infection).   A lasting burning sensation of the skin, caused usually by injury to a peripheral nerve.   Peripheral neuropathy which is widespread nerve damage, often caused by diabetes or alcoholism.   Phantom limb pain following an amputation.   Facial nerve problems (trigeminal neuralgia).   Multiple sclerosis.   Reflex sympathetic dystrophy.   Pain which comes with cancer and cancer chemotherapy.   Entrapment neuropathy such as when pressure is put on a nerve such as in carpal tunnel syndrome.   Back, leg, and hip problems (sciatica).   Spine or back surgery.   HIV Infection or AIDS where nerves are infected by viruses.  Your caregiver can explain items in the above list which may apply to you. SYMPTOMS  Characteristics of neuropathic pain are:  Severe, sharp, electric shock-like, shooting, lightening-like,  knife-like.   Pins and needles sensation.   Deep burning, deep cold, or deep ache.   Persistent numbness, tingling, or weakness.   Pain resulting from light touch or other stimulus that would not usually cause pain.   Increased sensitivity to something that would normally cause pain, such as a pinprick.  Pain may persist for months or years following the healing of damaged tissues. When this happens, pain signals no longer sound an alarm about current injuries or injuries about to happen. Instead, the alarm system itself is not working correctly.  Neuropathic pain may get worse instead of better over time. For some people, it can lead to serious disability. It is important to be aware that severe injury in a limb can occur without a proper, protective pain response.Burns, cuts, and other injuries may go unnoticed. Without proper treatment, these injuries can become infected or lead to further disability. Take any injury seriously, and consult your caregiver for treatment. DIAGNOSIS  When you have a pain with no known cause, your caregiver will probably ask some specific questions:   Do you have any other conditions, such as diabetes, shingles, multiple sclerosis, or HIV infection?   How would you describe your pain? (Neuropathic pain is often described as shooting, stabbing, burning, or searing.)  Is your pain worse at any time of the day? (Neuropathic pain is usually worse at night.)   Does the pain seem to follow a certain physical pathway?   Does the pain come from an area that has missing or injured nerves? (An example would be phantom limb pain.)   Is the pain triggered by minor things such as rubbing against the sheets at night?  These questions often help define the type of pain involved. Once your caregiver knows what is happening, treatment can begin. Anticonvulsant, antidepressant drugs, and various pain relievers seem to work in some cases. If another condition, such as  diabetes is involved, better management of that disorder may relieve the neuropathic pain.  TREATMENT  Neuropathic pain is frequently long-lasting and tends not to respond to treatment with narcotic type pain medication. It may respond well to other drugs such as antiseizure and antidepressant medications. Usually, neuropathic problems do not completely go away, but partial improvement is often possible with proper treatment. Your caregivers have large numbers of medications available to treat you. Do not be discouraged if you do not get immediate relief. Sometimes different medications or a combination of medications will be tried before you receive the results you are hoping for. See your caregiver if you have pain that seems to be coming from nowhere and does not go away. Help is available.  SEEK IMMEDIATE MEDICAL CARE IF:   There is a sudden change in the quality of your pain, especially if the change is on only one side of the body.   You notice changes of the skin, such as redness, black or purple discoloration, swelling, or an ulcer.   You cannot move the affected limbs.  Document Released: 04/06/2004 Document Revised: 06/29/2011 Document Reviewed: 04/06/2004 [facility] Patient Information 2012 [place], [place].Neuropathic Pain We often think that pain has a physical cause. If we get rid of the cause, the pain should go away. Nerves themselves can also cause pain. It is called neuropathic pain, which means nerve abnormality. It may be difficult for the patients who have it and for the treating caregivers. Pain is usually described as acute (short-lived) or chronic (long-lasting). Acute pain is related to the physical sensations caused by an injury. It can last from a few seconds to many weeks, but it usually goes away when normal healing occurs. Chronic pain lasts beyond the typical healing time. With neuropathic pain, the nerve fibers themselves may be damaged or injured. They then send incorrect  signals to other pain centers. The pain you feel is real, but the cause is not easy to find.  CAUSES  Chronic pain can result from diseases, such as diabetes and shingles (an infection related to chickenpox), or from trauma, surgery, or amputation. It can also happen without any known injury or disease. The nerves are sending pain messages, even though there is no identifiable cause for such messages.   Other common causes of neuropathy include diabetes, phantom limb pain, or Regional Pain Syndrome (RPS).   As with all forms of chronic back pain, if neuropathy is not correctly treated, there can be a number of associated problems that lead to a downward cycle for the patient. These include depression, sleeplessness, feelings of fear and anxiety, limited social interaction and inability to do normal daily activities or work.   The most dramatic and mysterious example of neuropathic pain is called "phantom limb syndrome." This occurs when an arm or a leg has been removed because of illness or injury.  The brain still gets pain messages from the nerves that originally carried impulses from the missing limb. These nerves now seem to misfire and cause troubling pain.   Neuropathic pain often seems to have no cause. It responds poorly to standard pain treatment.  Neuropathic pain can occur after:  Shingles (herpes zoster virus infection).   A lasting burning sensation of the skin, caused usually by injury to a peripheral nerve.   Peripheral neuropathy which is widespread nerve damage, often caused by diabetes or alcoholism.   Phantom limb pain following an amputation.   Facial nerve problems (trigeminal neuralgia).   Multiple sclerosis.   Reflex sympathetic dystrophy.   Pain which comes with cancer and cancer chemotherapy.   Entrapment neuropathy such as when pressure is put on a nerve such as in carpal tunnel syndrome.   Back, leg, and hip problems (sciatica).   Spine or back surgery.    HIV Infection or AIDS where nerves are infected by viruses.  Your caregiver can explain items in the above list which may apply to you. SYMPTOMS  Characteristics of neuropathic pain are:  Severe, sharp, electric shock-like, shooting, lightening-like, knife-like.   Pins and needles sensation.   Deep burning, deep cold, or deep ache.   Persistent numbness, tingling, or weakness.   Pain resulting from light touch or other stimulus that would not usually cause pain.   Increased sensitivity to something that would normally cause pain, such as a pinprick.  Pain may persist for months or years following the healing of damaged tissues. When this happens, pain signals no longer sound an alarm about current injuries or injuries about to happen. Instead, the alarm system itself is not working correctly.  Neuropathic pain may get worse instead of better over time. For some people, it can lead to serious disability. It is important to be aware that severe injury in a limb can occur without a proper, protective pain response.Burns, cuts, and other injuries may go unnoticed. Without proper treatment, these injuries can become infected or lead to further disability. Take any injury seriously, and consult your caregiver for treatment. DIAGNOSIS  When you have a pain with no known cause, your caregiver will probably ask some specific questions:   Do you have any other conditions, such as diabetes, shingles, multiple sclerosis, or HIV infection?   How would you describe your pain? (Neuropathic pain is often described as shooting, stabbing, burning, or searing.)   Is your pain worse at any time of the day? (Neuropathic pain is usually worse at night.)   Does the pain seem to follow a certain physical pathway?   Does the pain come from an area that has missing or injured nerves? (An example would be phantom limb pain.)   Is the pain triggered by minor things such as rubbing against the sheets at  night?  These questions often help define the type of pain involved. Once your caregiver knows what is happening, treatment can begin. Anticonvulsant, antidepressant drugs, and various pain relievers seem to work in some cases. If another condition, such as diabetes is involved, better management of that disorder may relieve the neuropathic pain.  TREATMENT  Neuropathic pain is frequently long-lasting and tends not to respond to treatment with narcotic type pain medication. It may respond well to other drugs such as antiseizure and antidepressant medications. Usually, neuropathic problems do not completely go away, but partial improvement is often possible with proper treatment. Your caregivers have large numbers of medications available to treat  you. Do not be discouraged if you do not get immediate relief. Sometimes different medications or a combination of medications will be tried before you receive the results you are hoping for. See your caregiver if you have pain that seems to be coming from nowhere and does not go away. Help is available.  SEEK IMMEDIATE MEDICAL CARE IF:   There is a sudden change in the quality of your pain, especially if the change is on only one side of the body.   You notice changes of the skin, such as redness, black or purple discoloration, swelling, or an ulcer.   You cannot move the affected limbs.  Document Released: 04/06/2004 Document Revised: 06/29/2011 Document Reviewed: 04/06/2004 [facility] Patient Information 2012 [place], [place].

## 2011-11-22 NOTE — ED Notes (Signed)
Patient ambulatory with steady gait. Respirations equal and unlabored. Skin warm and dry. No acute distress noted. 

## 2012-09-02 ENCOUNTER — Encounter (HOSPITAL_COMMUNITY): Payer: Self-pay | Admitting: Emergency Medicine

## 2012-09-02 ENCOUNTER — Emergency Department (HOSPITAL_COMMUNITY): Payer: Self-pay

## 2012-09-02 ENCOUNTER — Inpatient Hospital Stay (HOSPITAL_COMMUNITY)
Admission: EM | Admit: 2012-09-02 | Discharge: 2012-09-09 | DRG: 623 | Disposition: A | Discharge status: Home Health | Payer: Medicaid Other | Attending: Internal Medicine | Admitting: Internal Medicine

## 2012-09-02 DIAGNOSIS — L0390 Cellulitis, unspecified: Secondary | ICD-10-CM

## 2012-09-02 DIAGNOSIS — M869 Osteomyelitis, unspecified: Secondary | ICD-10-CM | POA: Diagnosis present

## 2012-09-02 DIAGNOSIS — L97509 Non-pressure chronic ulcer of other part of unspecified foot with unspecified severity: Secondary | ICD-10-CM | POA: Diagnosis present

## 2012-09-02 DIAGNOSIS — Z86711 Personal history of pulmonary embolism: Secondary | ICD-10-CM

## 2012-09-02 DIAGNOSIS — Z91199 Patient's noncompliance with other medical treatment and regimen due to unspecified reason: Secondary | ICD-10-CM

## 2012-09-02 DIAGNOSIS — E876 Hypokalemia: Secondary | ICD-10-CM | POA: Diagnosis not present

## 2012-09-02 DIAGNOSIS — E1169 Type 2 diabetes mellitus with other specified complication: Principal | ICD-10-CM | POA: Diagnosis present

## 2012-09-02 DIAGNOSIS — D649 Anemia, unspecified: Secondary | ICD-10-CM

## 2012-09-02 DIAGNOSIS — S98139A Complete traumatic amputation of one unspecified lesser toe, initial encounter: Secondary | ICD-10-CM

## 2012-09-02 DIAGNOSIS — Z6841 Body Mass Index (BMI) 40.0 and over, adult: Secondary | ICD-10-CM

## 2012-09-02 DIAGNOSIS — D72829 Elevated white blood cell count, unspecified: Secondary | ICD-10-CM

## 2012-09-02 DIAGNOSIS — L02612 Cutaneous abscess of left foot: Secondary | ICD-10-CM

## 2012-09-02 DIAGNOSIS — Z9119 Patient's noncompliance with other medical treatment and regimen: Secondary | ICD-10-CM

## 2012-09-02 DIAGNOSIS — I2699 Other pulmonary embolism without acute cor pulmonale: Secondary | ICD-10-CM

## 2012-09-02 DIAGNOSIS — A4901 Methicillin susceptible Staphylococcus aureus infection, unspecified site: Secondary | ICD-10-CM | POA: Diagnosis present

## 2012-09-02 DIAGNOSIS — E11621 Type 2 diabetes mellitus with foot ulcer: Secondary | ICD-10-CM

## 2012-09-02 DIAGNOSIS — E66813 Obesity, class 3: Secondary | ICD-10-CM

## 2012-09-02 DIAGNOSIS — E119 Type 2 diabetes mellitus without complications: Secondary | ICD-10-CM | POA: Diagnosis present

## 2012-09-02 DIAGNOSIS — E118 Type 2 diabetes mellitus with unspecified complications: Secondary | ICD-10-CM | POA: Diagnosis present

## 2012-09-02 DIAGNOSIS — IMO0002 Reserved for concepts with insufficient information to code with codable children: Principal | ICD-10-CM

## 2012-09-02 DIAGNOSIS — Z794 Long term (current) use of insulin: Secondary | ICD-10-CM | POA: Diagnosis present

## 2012-09-02 DIAGNOSIS — D638 Anemia in other chronic diseases classified elsewhere: Secondary | ICD-10-CM | POA: Diagnosis present

## 2012-09-02 DIAGNOSIS — E871 Hypo-osmolality and hyponatremia: Secondary | ICD-10-CM

## 2012-09-02 DIAGNOSIS — R7881 Bacteremia: Secondary | ICD-10-CM

## 2012-09-02 DIAGNOSIS — L02619 Cutaneous abscess of unspecified foot: Secondary | ICD-10-CM | POA: Diagnosis present

## 2012-09-02 LAB — CBC WITH DIFFERENTIAL/PLATELET
HCT: 37.8 % (ref 36.0–46.0)
Lymphocytes Relative: 19 % (ref 12–46)
Lymphs Abs: 1.9 10*3/uL (ref 0.7–4.0)
MCH: 30 pg (ref 26.0–34.0)
MCHC: 36 g/dL (ref 30.0–36.0)
MCV: 83.3 fL (ref 78.0–100.0)
Monocytes Relative: 7 % (ref 3–12)
Neutro Abs: 7.6 10*3/uL (ref 1.7–7.7)
Neutrophils Relative %: 73 % (ref 43–77)
Platelets: 230 10*3/uL (ref 150–400)
RDW: 12.5 % (ref 11.5–15.5)
WBC: 10.4 10*3/uL (ref 4.0–10.5)

## 2012-09-02 LAB — BASIC METABOLIC PANEL
BUN: 9 mg/dL (ref 6–23)
Chloride: 90 mEq/L — ABNORMAL LOW (ref 96–112)
Glucose, Bld: 339 mg/dL — ABNORMAL HIGH (ref 70–99)
Potassium: 3.8 mEq/L (ref 3.5–5.1)

## 2012-09-02 MED ORDER — HYDROMORPHONE HCL PF 1 MG/ML IJ SOLN
1.0000 mg | Freq: Once | INTRAMUSCULAR | Status: AC
  Administered 2012-09-02: 1 mg via INTRAVENOUS
  Filled 2012-09-02: qty 1

## 2012-09-02 MED ORDER — SODIUM CHLORIDE 0.9 % IV SOLN
Freq: Once | INTRAVENOUS | Status: AC
  Administered 2012-09-02: via INTRAVENOUS

## 2012-09-02 MED ORDER — MORPHINE SULFATE 4 MG/ML IJ SOLN: 6.0000 mg | Freq: Once | INTRAMUSCULAR | Status: DC

## 2012-09-02 MED ORDER — PIPERACILLIN-TAZOBACTAM 3.375 G IVPB
3.3750 g | Freq: Once | INTRAVENOUS | Status: AC
  Administered 2012-09-02: 3.375 g via INTRAVENOUS
  Filled 2012-09-02: qty 50

## 2012-09-02 MED ORDER — VANCOMYCIN HCL IN DEXTROSE 1-5 GM/200ML-% IV SOLN
1000.0000 mg | Freq: Once | INTRAVENOUS | Status: AC
  Administered 2012-09-02: 1000 mg via INTRAVENOUS
  Filled 2012-09-02: qty 200

## 2012-09-02 MED ORDER — METOCLOPRAMIDE HCL 5 MG/ML IJ SOLN
10.0000 mg | Freq: Once | INTRAMUSCULAR | Status: AC
  Administered 2012-09-02: 10 mg via INTRAVENOUS
  Filled 2012-09-02: qty 2

## 2012-09-02 NOTE — ED Notes (Signed)
Pt presents to ed with open wound on her left foot. Base of foot is swollen, wound is open, oozing with foul odor. Pt reports pain 10/10, unable to ambulate.

## 2012-09-02 NOTE — ED Provider Notes (Signed)
History     CSN: [id number]  Arrival date & time 09/02/12  1941   First MD Initiated Contact with Patient 09/02/12 2052      Chief Complaint  Patient presents with  . Wound Infection    The history is provided by the patient and medical records.   The patient reports increasing swelling and pain as well as redness to her distal left foot.  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  She underwent irrigation and debridement of her left foot chronic ulceration in April 2013.  She reports her last week her pain is been increasing in the size of the swelling as does increasing significantly.  She reports is a foul smell coming from it.  She took several days to come the emergency apartment because she states she did not have a ride.  Her pain is moderate to severe in severity.  Her pain is worsened by palpation and movement.  She also has a history of diabetes and pulmonary embolism.   Past Medical History  Diagnosis Date  . Cellulitis   . Diabetes mellitus   . PE (pulmonary embolism) ~2007-2008    Not on anticoagulation    Past Surgical History  Procedure Laterality Date  . Toe amputation      last 2 on L foot  . Surgery to remove hematoma in l leg    . Abscess removal from l groin    . I&d extremity  11/12/2011    Procedure: IRRIGATION AND DEBRIDEMENT EXTREMITY;  Surgeon: Kathryne Hitch, MD;  Location: [facility];  Service: Orthopedics;  Laterality: Left;  foot left    Family History  Problem Relation Age of Onset  . Diabetes Mother   . Coronary artery disease Mother     History  Substance Use Topics  . Smoking status: Never Smoker   . Smokeless tobacco: Never Used  . Alcohol Use: No    OB History   Grav Para Term Preterm Abortions TAB SAB Ect Mult Living                  Review of Systems  All other systems reviewed and are negative.    Allergies  Zofran; Doxycycline; and Morphine and related  Home Medications   Current  Outpatient Rx  Name  Route  Sig  Dispense  Refill  . ibuprofen (ADVIL,MOTRIN) 200 MG tablet   Oral   Take 800 mg by mouth every 6 (six) hours as needed. pain           BP 177/85  Pulse 104  Temp(Src) 98.1 F (36.7 C) (Oral)  Resp 20  SpO2 98%  LMP 08/26/2012  Physical Exam  Nursing note and vitals reviewed. Constitutional: She is oriented to person, place, and time. She appears well-developed and well-nourished. No distress.  HENT:  Head: Normocephalic and atraumatic.  Eyes: EOM are normal.  Neck: Normal range of motion.  Cardiovascular: Normal rate, regular rhythm and normal heart sounds.   Pulmonary/Chest: Effort normal and breath sounds normal.  Abdominal: Soft. She exhibits no distension. There is no tenderness.  Musculoskeletal: Normal range of motion.  Left foot with significant swelling and chronic appearing ulceration of her left distal lateral foot overlying the plantar surface.  There is a large 6 6 x 6 cm fluctuant mass without significant drainage.  This is extremely tender to the patient is warm to the touch.  No significant spreading erythema.  Significant pain along the fourth  and fifth metatarsals.  Neurological: She is alert and oriented to person, place, and time.  Skin: Skin is warm and dry.  Psychiatric: She has a normal mood and affect. Judgment normal.    ED Course  Procedures   Labs Reviewed  BASIC METABOLIC PANEL - Abnormal; Notable for the following:    Sodium 126 (*)    Chloride 90 (*)    Glucose, Bld 339 (*)    Creatinine, Ser 0.47 (*)    All other components within normal limits  CULTURE, BLOOD (ROUTINE X 2)  CULTURE, BLOOD (ROUTINE X 2)  CBC WITH DIFFERENTIAL   Dg Foot Complete Left  09/02/2012  *RADIOLOGY REPORT*  Clinical Data: Diabetes.  Swelling.  Pain.  Wound infection.  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  Extensive prominent overlying and plantar soft tissue swelling noted with a small amount of suspected gas in the soft tissues. Chronic fragmented spurring along the medial base of the remaining fourth metatarsal observed.  The there is some progressive erosion of the distal margin of the fifth metatarsal which could reflect osteomyelitis.  Dorsal midfoot spurring observed. Subcutaneous edema noted dorsally in the foot and ankle.  IMPRESSION:  1. Extensive and increased soft tissue swelling adjacent to the fourth and fifth metatarsals, with some progressive erosion of the distal fifth metatarsal raising the possibility of osteomyelitis, and a small amount of gas along the irregular soft tissue margins favoring ulceration. 2.  Subcutaneous edema noted dorsally in the foot and ankle.   Original Report Authenticated By: Gaylyn Rong, M.D.    I personally reviewed the imaging tests through PACS system I reviewed available ER/hospitalization records through the EMR   1. Diabetic foot ulcer   2. Foot abscess, left       MDM  Spoke with Dr Ophelia Charter, ortho who will evaluate in the AM. Recommends MRI to evaluate further. Pt will be admitted to hospitalist service.         Lyanne Co, MD 09/02/12 [phone]

## 2012-09-02 NOTE — ED Notes (Signed)
Pt c/o pain redness,and swelling to left foot. Pt has a wound that is not healing and malodorous.pt had surery on foot in April of 2013.

## 2012-09-03 ENCOUNTER — Encounter (HOSPITAL_COMMUNITY): Payer: Self-pay | Admitting: Internal Medicine

## 2012-09-03 ENCOUNTER — Inpatient Hospital Stay (HOSPITAL_COMMUNITY): Payer: Self-pay

## 2012-09-03 DIAGNOSIS — L03119 Cellulitis of unspecified part of limb: Secondary | ICD-10-CM

## 2012-09-03 DIAGNOSIS — M869 Osteomyelitis, unspecified: Secondary | ICD-10-CM | POA: Diagnosis present

## 2012-09-03 DIAGNOSIS — E118 Type 2 diabetes mellitus with unspecified complications: Secondary | ICD-10-CM

## 2012-09-03 DIAGNOSIS — M7989 Other specified soft tissue disorders: Secondary | ICD-10-CM

## 2012-09-03 DIAGNOSIS — L97509 Non-pressure chronic ulcer of other part of unspecified foot with unspecified severity: Secondary | ICD-10-CM

## 2012-09-03 DIAGNOSIS — E871 Hypo-osmolality and hyponatremia: Secondary | ICD-10-CM

## 2012-09-03 DIAGNOSIS — L0291 Cutaneous abscess, unspecified: Secondary | ICD-10-CM

## 2012-09-03 DIAGNOSIS — E1169 Type 2 diabetes mellitus with other specified complication: Secondary | ICD-10-CM

## 2012-09-03 DIAGNOSIS — D72829 Elevated white blood cell count, unspecified: Secondary | ICD-10-CM

## 2012-09-03 LAB — GLUCOSE, CAPILLARY
Glucose-Capillary: 313 mg/dL — ABNORMAL HIGH (ref 70–99)
Glucose-Capillary: 270 mg/dL — ABNORMAL HIGH (ref 70–99)
Glucose-Capillary: 312 mg/dL — ABNORMAL HIGH (ref 70–99)
Glucose-Capillary: 288 mg/dL — ABNORMAL HIGH (ref 70–99)

## 2012-09-03 LAB — BASIC METABOLIC PANEL
Chloride: 93 mEq/L — ABNORMAL LOW (ref 96–112)
GFR calc Af Amer: >90 mL/min (ref >90)
GFR calc non Af Amer: >90 mL/min (ref >90)
Potassium: 3.5 mEq/L (ref 3.5–5.1)
Sodium: 127 mEq/L — ABNORMAL LOW (ref 135–145)

## 2012-09-03 LAB — CBC
HCT: 36.4 % (ref 36.0–46.0)
Hemoglobin: 12.8 g/dL (ref 12.0–15.0)
WBC: 10.9 10*3/uL — ABNORMAL HIGH (ref 4.0–10.5)

## 2012-09-03 LAB — SURGICAL PCR SCREEN: MRSA, PCR: NEGATIVE

## 2012-09-03 LAB — HEMOGLOBIN A1C
Hgb A1c MFr Bld: 16.8 % — ABNORMAL HIGH (ref <5.7)
Mean Plasma Glucose: 435 mg/dL — ABNORMAL HIGH (ref <117)

## 2012-09-03 MED ORDER — PIPERACILLIN-TAZOBACTAM 3.375 G IVPB
3.3750 g | Freq: Three times a day (TID) | INTRAVENOUS | Status: DC
  Administered 2012-09-03 – 2012-09-05 (×9): 3.375 g via INTRAVENOUS
  Filled 2012-09-03 (×13): qty 50

## 2012-09-03 MED ORDER — VANCOMYCIN HCL IN DEXTROSE 1-5 GM/200ML-% IV SOLN
1000.0000 mg | Freq: Two times a day (BID) | INTRAVENOUS | Status: DC
  Administered 2012-09-03: 1000 mg via INTRAVENOUS
  Filled 2012-09-03: qty 200

## 2012-09-03 MED ORDER — PROMETHAZINE HCL 25 MG/ML IJ SOLN
12.5000 mg | Freq: Four times a day (QID) | INTRAMUSCULAR | Status: DC | PRN
  Administered 2012-09-03 – 2012-09-09 (×19): 12.5 mg via INTRAVENOUS
  Filled 2012-09-03 (×20): qty 1

## 2012-09-03 MED ORDER — HYDROMORPHONE HCL PF 1 MG/ML IJ SOLN
1.0000 mg | INTRAMUSCULAR | Status: DC | PRN
  Administered 2012-09-03 (×2): 1 mg via INTRAVENOUS
  Filled 2012-09-03 (×2): qty 1

## 2012-09-03 MED ORDER — INSULIN ASPART 100 UNIT/ML ~~LOC~~ SOLN
0.0000 [IU] | Freq: Three times a day (TID) | SUBCUTANEOUS | Status: DC
  Administered 2012-09-03: 5 [IU] via SUBCUTANEOUS

## 2012-09-03 MED ORDER — HYDROMORPHONE HCL PF 1 MG/ML IJ SOLN
1.0000 mg | INTRAMUSCULAR | Status: DC | PRN
  Administered 2012-09-03 – 2012-09-05 (×16): 1 mg via INTRAVENOUS
  Filled 2012-09-03 (×16): qty 1

## 2012-09-03 MED ORDER — ACETAMINOPHEN 325 MG PO TABS
650.0000 mg | ORAL_TABLET | Freq: Four times a day (QID) | ORAL | Status: DC | PRN
  Administered 2012-09-07: 650 mg via ORAL
  Filled 2012-09-03: qty 2

## 2012-09-03 MED ORDER — INSULIN GLARGINE 100 UNIT/ML ~~LOC~~ SOLN
5.0000 [IU] | Freq: Every day | SUBCUTANEOUS | Status: DC
  Administered 2012-09-03: 5 [IU] via SUBCUTANEOUS

## 2012-09-03 MED ORDER — BD GETTING STARTED TAKE HOME KIT: 1/2ML X 30G SYRINGES
1.0000 | Freq: Once | Status: DC
  Filled 2012-09-03: qty 1

## 2012-09-03 MED ORDER — HYDROMORPHONE HCL PF 1 MG/ML IJ SOLN
1.0000 mg | Freq: Once | INTRAMUSCULAR | Status: AC
  Administered 2012-09-03: 1 mg via INTRAVENOUS
  Filled 2012-09-03: qty 1

## 2012-09-03 MED ORDER — IOHEXOL 300 MG/ML  SOLN
100.0000 mL | Freq: Once | INTRAMUSCULAR | Status: AC | PRN
  Administered 2012-09-03: 100 mL via INTRAVENOUS

## 2012-09-03 MED ORDER — HYDROMORPHONE HCL PF 1 MG/ML IJ SOLN
0.5000 mg | INTRAMUSCULAR | Status: DC | PRN
  Administered 2012-09-03: 0.5 mg via INTRAVENOUS
  Filled 2012-09-03: qty 1

## 2012-09-03 MED ORDER — INSULIN ASPART 100 UNIT/ML ~~LOC~~ SOLN
0.0000 [IU] | Freq: Three times a day (TID) | SUBCUTANEOUS | Status: DC
  Administered 2012-09-03: 11 [IU] via SUBCUTANEOUS
  Administered 2012-09-03 – 2012-09-04 (×2): 8 [IU] via SUBCUTANEOUS
  Administered 2012-09-04: 11 [IU] via SUBCUTANEOUS
  Administered 2012-09-05: 5 [IU] via SUBCUTANEOUS
  Administered 2012-09-05: 8 [IU] via SUBCUTANEOUS
  Administered 2012-09-05: 11 [IU] via SUBCUTANEOUS
  Administered 2012-09-06: 3 [IU] via SUBCUTANEOUS
  Administered 2012-09-06: 5 [IU] via SUBCUTANEOUS
  Administered 2012-09-06: 3 [IU] via SUBCUTANEOUS
  Administered 2012-09-07: 5 [IU] via SUBCUTANEOUS
  Administered 2012-09-07 – 2012-09-08 (×3): 3 [IU] via SUBCUTANEOUS
  Administered 2012-09-08 (×2): 2 [IU] via SUBCUTANEOUS
  Administered 2012-09-09: 3 [IU] via SUBCUTANEOUS
  Administered 2012-09-09: 2 [IU] via SUBCUTANEOUS

## 2012-09-03 MED ORDER — SODIUM CHLORIDE 0.9 % IV SOLN: INTRAVENOUS | Status: DC

## 2012-09-03 MED ORDER — OXYCODONE HCL 5 MG PO TABS
5.0000 mg | ORAL_TABLET | ORAL | Status: DC | PRN
  Administered 2012-09-03 – 2012-09-09 (×21): 5 mg via ORAL
  Filled 2012-09-03 (×21): qty 1

## 2012-09-03 MED ORDER — ACETAMINOPHEN 650 MG RE SUPP: 650.0000 mg | Freq: Four times a day (QID) | RECTAL | Status: DC | PRN

## 2012-09-03 MED ORDER — INSULIN ASPART 100 UNIT/ML ~~LOC~~ SOLN
5.0000 [IU] | Freq: Once | SUBCUTANEOUS | Status: AC
  Administered 2012-09-03: 5 [IU] via SUBCUTANEOUS

## 2012-09-03 MED ORDER — BD GETTING STARTED TAKE HOME KIT: 3/10ML X 30G SYRINGES
1.0000 | Freq: Once | Status: AC
  Administered 2012-09-03: 1
  Filled 2012-09-03: qty 1

## 2012-09-03 MED ORDER — SODIUM CHLORIDE 0.9 % IV SOLN
INTRAVENOUS | Status: DC
  Administered 2012-09-03 (×2): via INTRAVENOUS
  Administered 2012-09-04: 100 mL/h via INTRAVENOUS
  Administered 2012-09-06 – 2012-09-09 (×7): via INTRAVENOUS

## 2012-09-03 MED ORDER — VANCOMYCIN HCL 10 G IV SOLR
1500.0000 mg | Freq: Two times a day (BID) | INTRAVENOUS | Status: DC
  Administered 2012-09-03 – 2012-09-05 (×5): 1500 mg via INTRAVENOUS
  Filled 2012-09-03 (×5): qty 1500

## 2012-09-03 NOTE — ED Notes (Signed)
Floor Unit RN unavailable to take report on pt at this time. 

## 2012-09-03 NOTE — Consult Note (Signed)
WOC consult Note Reason for Consult:consulted simultaneously with ortho, who is managing.  I will add only nursing care orders for saline dressings Q shift. Wound type:neuropathic Pressure Ulcer POA:No Measurement:4cm x 2cm x 0.2cm Wound UXL:KGMW, moist, clean and without texture Drainage (amount, consistency, odor) small amount serous exudate Periwound:intact, but consistent with bony deformity noted in ortho note Dressing procedure/placement/frequency:saline dressings twice daily and PRN initiated. I will not follow.  Please re-consult if needed. Thanks, Ladona Mow, MSN, RN, [facility], CWOCN [phone])

## 2012-09-03 NOTE — Progress Notes (Signed)
ANTIBIOTIC CONSULT NOTE - INITIAL  Pharmacy Consult for Vancomycin and Zosyn  Indication: Cellulitis/poss. osteo  Allergies  Allergen Reactions  . Zofran Itching and Nausea And Vomiting  . Doxycycline Nausea And Vomiting  . Morphine And Related Itching    Patient Measurements: Height: 5\' 8"  (172.7 cm) Weight: 390 lb 3.4 oz (177 kg) IBW/kg (Calculated) : 63.9 Adjusted Body Weight:   Vital Signs: Temp: 99.2 F (37.3 C) (02/11 0356) Temp src: Oral (02/11 0356) BP: 133/66 mmHg (02/11 0356) Pulse Rate: 75 (02/11 0356) Intake/Output from previous day:   Intake/Output from this shift:    Labs:  Recent Labs  09/02/12 2220  WBC 10.4  HGB 13.6  PLT 230  CREATININE 0.47*   Estimated Creatinine Clearance: 169 ml/min (by C-G formula based on Cr of 0.47). No results found for this basename: VANCOTROUGH, VANCOPEAK, VANCORANDOM, GENTTROUGH, GENTPEAK, GENTRANDOM, TOBRATROUGH, TOBRAPEAK, TOBRARND, AMIKACINPEAK, AMIKACINTROU, AMIKACIN,  in the last 72 hours   Microbiology: No results found for this or any previous visit (from the past 720 hour(s)).  Medical History: Past Medical History  Diagnosis Date  . Cellulitis   . Diabetes mellitus   . PE (pulmonary embolism) ~2007-2008    Not on anticoagulation    Medications:  Anti-infectives   Start     Dose/Rate Route Frequency Ordered Stop   09/03/12 0600  vancomycin (VANCOCIN) IVPB 1000 mg/200 mL premix     1,000 mg 200 mL/hr over 60 Minutes Intravenous Every 12 hours 09/03/12 0416     09/03/12 0600  piperacillin-tazobactam (ZOSYN) IVPB 3.375 g     3.375 g 12.5 mL/hr over 240 Minutes Intravenous 3 times per day 09/03/12 0416     09/02/12 2145  vancomycin (VANCOCIN) IVPB 1000 mg/200 mL premix     1,000 mg 200 mL/hr over 60 Minutes Intravenous  Once 09/02/12 2136 09/03/12 0144   09/02/12 2145  piperacillin-tazobactam (ZOSYN) IVPB 3.375 g     3.375 g 100 mL/hr over 30 Minutes Intravenous  Once 09/02/12 2136 09/02/12 2350      Assessment: Patient with cellulitis and possible osteo in foot.  First dose of antibiotics already given in ED.  Patient >100kg and only 1gm vancomycin load given vs 2gm.  Will dose for osteo for now.  Goal of Therapy:  Vancomycin trough level 15-20 mcg/ml Zosyn based on renal function  Plan:  Measure antibiotic drug levels at steady state Follow up culture results Vancomycin 1gm iv q12hr (~6hr after prior to more equal 2gm given) Zosyn 3.375g IV Q8H infused over 4hrs.   Aleene Davidson Crowford 09/03/2012,4:21 AM

## 2012-09-03 NOTE — Consult Note (Signed)
Reason for Consult:left diabetic foot infection Referring Physician: Raymond Gurney MD  [facility] Karen Bennett is an 37 y.o. female.  HPI:  37 yo morbidly obese female diabetic , no insurance , medicaid denied in past , had 2 wks of insulin 25 units daily when left the hospital last admission but has not seen any care provider, no doctor. Noticed left foot which has had previous left 4th and 5th ray amputation in past develop increased pain and swelling in her foot.      Past Medical History  Diagnosis Date  . Cellulitis   . Diabetes mellitus   . PE (pulmonary embolism) ~2007-2008    Not on anticoagulation    Past Surgical History  Procedure Laterality Date  . Toe amputation      last 2 on L foot  . Surgery to remove hematoma in l leg    . Abscess removal from l groin    . I&d extremity  11/12/2011    Procedure: IRRIGATION AND DEBRIDEMENT EXTREMITY;  Surgeon: Kathryne Hitch, MD;  Location: [facility];  Service: Orthopedics;  Laterality: Left;  foot left    Family History  Problem Relation Age of Onset  . Diabetes Mother   . Coronary artery disease Mother     Social History:  reports that she has never smoked. She has never used smokeless tobacco. She reports that she does not drink alcohol or use illicit drugs.  Allergies:  Allergies  Allergen Reactions  . Zofran Itching and Nausea And Vomiting  . Doxycycline Nausea And Vomiting  . Morphine And Related Itching    Medications: I have reviewed the patient's current medications.  Results for orders placed during the hospital encounter of 09/02/12 (from the past 48 hour(s))  CBC WITH DIFFERENTIAL     Status: None   Collection Time    09/02/12 10:20 PM      Result Value Range   WBC 10.4  4.0 - 10.5 K/uL   RBC 4.54  3.87 - 5.11 MIL/uL   Hemoglobin 13.6  12.0 - 15.0 g/dL   HCT 16.1  09.6 - 04.5 %   MCV 83.3  78.0 - 100.0 fL   MCH 30.0  26.0 - 34.0 pg   MCHC 36.0  30.0 - 36.0 g/dL   RDW 40.9  81.1 - 91.4 %   Platelets 230  150  - 400 K/uL   Neutrophils Relative 73  43 - 77 %   Neutro Abs 7.6  1.7 - 7.7 K/uL   Lymphocytes Relative 19  12 - 46 %   Lymphs Abs 1.9  0.7 - 4.0 K/uL   Monocytes Relative 7  3 - 12 %   Monocytes Absolute 0.8  0.1 - 1.0 K/uL   Eosinophils Relative 1  0 - 5 %   Eosinophils Absolute 0.1  0.0 - 0.7 K/uL   Basophils Relative 0  0 - 1 %   Basophils Absolute 0.0  0.0 - 0.1 K/uL  BASIC METABOLIC PANEL     Status: Abnormal   Collection Time    09/02/12 10:20 PM      Result Value Range   Sodium 126 (*) 135 - 145 mEq/L   Potassium 3.8  3.5 - 5.1 mEq/L   Chloride 90 (*) 96 - 112 mEq/L   CO2 23  19 - 32 mEq/L   Glucose, Bld 339 (*) 70 - 99 mg/dL   BUN 9  6 - 23 mg/dL   Creatinine, Ser 7.82 (*) 0.50 -  1.10 mg/dL   Calcium 8.9  8.4 - 10.2 mg/dL   GFR calc non Af Amer >90  >90 mL/min   GFR calc Af Amer >90  >90 mL/min   Comment:            The eGFR has been calculated     using the CKD EPI equation.     This calculation has not been     validated in all clinical     situations.     eGFR's persistently     <90 mL/min signify     possible Chronic Kidney Disease.  GLUCOSE, CAPILLARY     Status: Abnormal   Collection Time    09/03/12  3:00 AM      Result Value Range   Glucose-Capillary 313 (*) 70 - 99 mg/dL  SURGICAL PCR SCREEN     Status: Abnormal   Collection Time    09/03/12  3:58 AM      Result Value Range   MRSA, PCR NEGATIVE  NEGATIVE   Staphylococcus aureus POSITIVE (*) NEGATIVE   Comment:            The Xpert SA Assay (FDA     approved for NASAL specimens     in patients over 41 years of age),     is one component of     a comprehensive surveillance     program.  Test performance has     been validated by [employer] for patients greater     than or equal to 75 year old.     It is not intended     to diagnose infection nor to     guide or monitor treatment.  BASIC METABOLIC PANEL     Status: Abnormal   Collection Time    09/03/12  5:05 AM      Result Value Range    Sodium 127 (*) 135 - 145 mEq/L   Potassium 3.5  3.5 - 5.1 mEq/L   Chloride 93 (*) 96 - 112 mEq/L   CO2 24  19 - 32 mEq/L   Glucose, Bld 290 (*) 70 - 99 mg/dL   BUN 9  6 - 23 mg/dL   Creatinine, Ser 7.25  0.50 - 1.10 mg/dL   Calcium 8.7  8.4 - 36.6 mg/dL   GFR calc non Af Amer >90  >90 mL/min   GFR calc Af Amer >90  >90 mL/min   Comment:            The eGFR has been calculated     using the CKD EPI equation.     This calculation has not been     validated in all clinical     situations.     eGFR's persistently     <90 mL/min signify     possible Chronic Kidney Disease.  CBC     Status: Abnormal   Collection Time    09/03/12  5:05 AM      Result Value Range   WBC 10.9 (*) 4.0 - 10.5 K/uL   RBC 4.32  3.87 - 5.11 MIL/uL   Hemoglobin 12.8  12.0 - 15.0 g/dL   HCT 44.0  34.7 - 42.5 %   MCV 84.3  78.0 - 100.0 fL   MCH 29.6  26.0 - 34.0 pg   MCHC 35.2  30.0 - 36.0 g/dL   RDW 95.6  38.7 - 56.4 %   Platelets 221  150 - 400  K/uL  GLUCOSE, CAPILLARY     Status: Abnormal   Collection Time    09/03/12  7:37 AM      Result Value Range   Glucose-Capillary 270 (*) 70 - 99 mg/dL   Comment 1 Notify RN    GLUCOSE, CAPILLARY     Status: Abnormal   Collection Time    09/03/12 11:25 AM      Result Value Range   Glucose-Capillary 268 (*) 70 - 99 mg/dL   Comment 1 Notify RN      Dg Foot Complete Left  09/02/2012  *RADIOLOGY REPORT*  Clinical Data: Diabetes.  Swelling.  Pain.  Wound infection.  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  Extensive prominent overlying and plantar soft tissue swelling noted with a small amount of suspected gas in the soft tissues. Chronic fragmented spurring along the medial base of the remaining fourth metatarsal observed.  The there is some progressive erosion of the distal margin of the fifth metatarsal which could reflect osteomyelitis.   Dorsal midfoot spurring observed. Subcutaneous edema noted dorsally in the foot and ankle.  IMPRESSION:  1. Extensive and increased soft tissue swelling adjacent to the fourth and fifth metatarsals, with some progressive erosion of the distal fifth metatarsal raising the possibility of osteomyelitis, and a small amount of gas along the irregular soft tissue margins favoring ulceration. 2.  Subcutaneous edema noted dorsally in the foot and ankle.   Original Report Authenticated By: Gaylyn Rong, M.D.     Review of Systems  Constitutional: Negative for fever, chills and weight loss.  HENT: Negative.   Eyes: Negative.   Respiratory:       Hx of PE  Cardiovascular: Negative.   Neurological: Negative for sensory change.       Decreased sensation in feet .    Blood pressure 118/56, pulse 87, temperature 99.9 F (37.7 C), temperature source Oral, resp. rate 18, height 5\' 8"  (1.727 m), weight 177 kg (390 lb 3.4 oz), last menstrual period 08/26/2012, SpO2 94.00%. Physical Exam  Constitutional: She appears well-developed.  mobidly obese,  Not ill appearing.   HENT:  Head: Normocephalic.  Cardiovascular: Normal rate.   Respiratory: Effort normal.  GI: She exhibits no distension. There is no tenderness.  Musculoskeletal:  Skin excoriation of left foot, with edge hard callus buildup.  Patient states enlarged area 2 X4 CM developed over the last week with increased pain.  Good cap refill  Skin: No rash noted. No pallor.  Psychiatric: She has a normal mood and affect.    Assessment/Plan: Left foot cellulitis.  Uncontrolled diabetes with previous A!C of 10.1 and no insulin since May of 2013.   Plan CT scan to evaluate whether this can be addressed at bedside or needs to go to the OR if there is an islolated  Fluid collection. She has calf swelling and will Hx of DVT and PE , doppler test  Needs to be done will order. Will followup with you and after review of CT decide on tx.   Bertie Mcconathy  C 09/03/2012, 12:05 PM

## 2012-09-03 NOTE — ED Notes (Signed)
Dr. Patria Mane and Dr. Dierdre Highman at bedside.

## 2012-09-03 NOTE — Progress Notes (Signed)
Left:  No evidence of DVT, superficial thrombosis, or Baker's cyst.  Right:  Negative for DVT in the common femoral vein.  

## 2012-09-03 NOTE — Progress Notes (Signed)
ANTIBIOTIC CONSULT NOTE - FOLLOW UP  Pharmacy Consult for Vanc, Zosyn Indication: cellulitis, r/o osteo  Allergies  Allergen Reactions  . Zofran Itching and Nausea And Vomiting  . Doxycycline Nausea And Vomiting  . Morphine And Related Itching    Patient Measurements: Height: 5\' 8"  (172.7 cm) Weight: 390 lb 3.4 oz (177 kg) IBW/kg (Calculated) : 63.9  Vital Signs: Temp: 99.9 F (37.7 C) (02/11 0829) Temp src: Oral (02/11 0829) BP: 118/56 mmHg (02/11 0829) Pulse Rate: 87 (02/11 0829) Intake/Output from previous day:   Intake/Output from this shift:    Labs:  Recent Labs  09/02/12 2220 09/03/12 0505  WBC 10.4 10.9*  HGB 13.6 12.8  PLT 230 221  CREATININE 0.47* 0.68   Estimated Creatinine Clearance: 169 ml/min (by C-G formula based on Cr of 0.68). No results found for this basename: Rolm [place]ala, [place], GENTTROUGH, GENTPEAK, GENTRANDOM, TOBRATROUGH, TOBRAPEAK, TOBRARND, AMIKACINPEAK, AMIKACINTROU, AMIKACIN,  in the last 72 hours    Assessment:  8 yof with long-standing chronic diabetic ulceration of L foot, s/p fourth and fifth toe amputations in 2013 and underwent I&D 10/2011 presented 09/02/12 with L foot cellulitis. X-ray of L foot shows possibility of osteo.  Today is D#2 Vanc and Zosyn.  MRI cannot be completed to r/o osteo given patient's weight.  Tmax is 99.9, WBC 10.9, Scr okay with CrCl (CG and normalized) > 100 ml/min  Goal of Therapy:  Vancomycin trough level 15-20 mcg/ml  Plan:   Increase Vancomycin to 1500 mg IV q12h  Continue Zosyn as ordered  Pharmacy will f/u  Geoffry Paradise, PharmD, BCPS Pager: [phone] 11:44 AM Pharmacy #: [id number]

## 2012-09-03 NOTE — Progress Notes (Signed)
Patient ID: Karen Bennett, female   DOB: [birth year], 37 y.o.   MRN: [id number] CT of foot shows no abscess formation and no osteomyelitis. Will debribe callus at bedside tomorrow. Her principle problem is no insulin and no medical care after discharge.  Will need some type of help with this obviously to prevent repeated admissions etc.

## 2012-09-03 NOTE — H&P (Signed)
Karen Bennett is an 37 y.o. female.   Patient was seen and examined on September 03, 2012. PCP - none. Chief Complaint: Left foot swelling pain and discharge. HPI: 37 year-old female with history of diabetes mellitus type 2 not on medication presently secondary to financial issues presents with complaint of worsening swelling pain and discharge from left foot. 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 In the ER patient had x-ray of the left foot which shows possibility of osteomyelitis and on clinical exam patient has significant swelling concerning for cellulitis. Patient's blood sugar is also uncontrolled.  Past Medical History  Diagnosis Date  . Cellulitis   . Diabetes mellitus   . PE (pulmonary embolism) ~2007-2008    Not on anticoagulation    Past Surgical History  Procedure Laterality Date  . Toe amputation      last 2 on L foot  . Surgery to remove hematoma in l leg    . Abscess removal from l groin    . I&d extremity  11/12/2011    Procedure: IRRIGATION AND DEBRIDEMENT EXTREMITY;  Surgeon: Kathryne Hitch, MD;  Location: [facility];  Service: Orthopedics;  Laterality: Left;  foot left    Family History  Problem Relation Age of Onset  . Diabetes Mother   . Coronary artery disease Mother    Social History:  reports that she has never smoked. She has never used smokeless tobacco. She reports that she does not drink alcohol or use illicit drugs.  Allergies:  Allergies  Allergen Reactions  . Zofran Itching and Nausea And Vomiting  . Doxycycline Nausea And Vomiting  . Morphine And Related Itching     (Not in a hospital admission)  Results for orders placed during the hospital encounter of 09/02/12 (from the past 48 hour(s))  CBC WITH DIFFERENTIAL     Status: None   Collection Time    09/02/12 10:20 PM      Result Value Range   WBC 10.4  4.0 - 10.5 K/uL   RBC 4.54  3.87 - 5.11 MIL/uL   Hemoglobin 13.6   12.0 - 15.0 g/dL   HCT 91.4  78.2 - 95.6 %   MCV 83.3  78.0 - 100.0 fL   MCH 30.0  26.0 - 34.0 pg   MCHC 36.0  30.0 - 36.0 g/dL   RDW 21.3  08.6 - 57.8 %   Platelets 230  150 - 400 K/uL   Neutrophils Relative 73  43 - 77 %   Neutro Abs 7.6  1.7 - 7.7 K/uL   Lymphocytes Relative 19  12 - 46 %   Lymphs Abs 1.9  0.7 - 4.0 K/uL   Monocytes Relative 7  3 - 12 %   Monocytes Absolute 0.8  0.1 - 1.0 K/uL   Eosinophils Relative 1  0 - 5 %   Eosinophils Absolute 0.1  0.0 - 0.7 K/uL   Basophils Relative 0  0 - 1 %   Basophils Absolute 0.0  0.0 - 0.1 K/uL  BASIC METABOLIC PANEL     Status: Abnormal   Collection Time    09/02/12 10:20 PM      Result Value Range   Sodium 126 (*) 135 - 145 mEq/L   Potassium 3.8  3.5 - 5.1 mEq/L   Chloride 90 (*) 96 - 112 mEq/L   CO2 23  19 - 32 mEq/L   Glucose, Bld 339 (*) 70 -  99 mg/dL   BUN 9  6 - 23 mg/dL   Creatinine, Ser 1.61 (*) 0.50 - 1.10 mg/dL   Calcium 8.9  8.4 - 09.6 mg/dL   GFR calc non Af Amer >90  >90 mL/min   GFR calc Af Amer >90  >90 mL/min   Comment:            The eGFR has been calculated     using the CKD EPI equation.     This calculation has not been     validated in all clinical     situations.     eGFR's persistently     <90 mL/min signify     possible Chronic Kidney Disease.   Dg Foot Complete Left  09/02/2012  *RADIOLOGY REPORT*  Clinical Data: Diabetes.  Swelling.  Pain.  Wound infection.  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  Extensive prominent overlying and plantar soft tissue swelling noted with a small amount of suspected gas in the soft tissues. Chronic fragmented spurring along the medial base of the remaining fourth metatarsal observed.  The there is some progressive erosion of the distal margin of the fifth metatarsal which could reflect osteomyelitis.  Dorsal midfoot spurring observed.  Subcutaneous edema noted dorsally in the foot and ankle.  IMPRESSION:  1. Extensive and increased soft tissue swelling adjacent to the fourth and fifth metatarsals, with some progressive erosion of the distal fifth metatarsal raising the possibility of osteomyelitis, and a small amount of gas along the irregular soft tissue margins favoring ulceration. 2.  Subcutaneous edema noted dorsally in the foot and ankle.   Original Report Authenticated By: Gaylyn Rong, M.D.     Review of Systems  Constitutional: Negative.   HENT: Negative.   Eyes: Negative.   Respiratory: Negative.   Cardiovascular: Negative.   Gastrointestinal: Negative.   Genitourinary: Negative.   Musculoskeletal:       Left foot pain and swelling with discharge.  Skin: Negative.   Neurological: Negative.   Endo/Heme/Allergies: Negative.     Blood pressure 177/85, pulse 104, temperature 98.1 F (36.7 C), temperature source Oral, resp. rate 20, last menstrual period 08/26/2012, SpO2 98.00%. Physical Exam  Constitutional: She is oriented to person, place, and time. She appears well-developed and well-nourished. No distress.  HENT:  Head: Normocephalic and atraumatic.  Eyes: Conjunctivae are normal. Pupils are equal, round, and reactive to light. Right eye exhibits no discharge. Left eye exhibits no discharge. No scleral icterus.  Neck: Normal range of motion. Neck supple.  Cardiovascular: Normal rate and regular rhythm.   Respiratory: Effort normal and breath sounds normal. No respiratory distress. She has no wheezes. She has no rales.  GI: Soft. Bowel sounds are normal. She exhibits no distension. There is no tenderness. There is no rebound.  Musculoskeletal:  Left foot hasa4 cm ulcer on the plantar aspect with drainage.  Neurological: She is alert and oriented to person, place, and time.  Skin: Skin is warm and dry. She is not diaphoretic.     Assessment/Plan #1. Left foot cellulitis with possible osteomyelitis -  at this time ER physician has already discussed with Dr. Ophelia Charter orthopedic surgeon on call. Dr. Ophelia Charter has advised an MRI of the left foot and they will be seeing patient in consult. Continue with vancomycin and Zosyn for now. Pain relief medications. #2. Uncontrolled diabetes mellitus type 2 secondary to  noncompliance - at this time I have ordered one dose of NovoLog 5 units subcutaneous. Place patient on sliding-scale coverage and patient may need long-acting insulin. Check hemoglobin A1c. #3. Hyponatremia - probably from uncontrolled diabetes and dehydration. Recheck metabolic panel after hydration and control of blood sugar.  CODE STATUS - full code.  Matty Vanroekel N. 09/03/2012, 1:54 AM

## 2012-09-03 NOTE — Progress Notes (Signed)
RN notified by imaging department that the patient's MRI can not be done here because she exceeds the weight limit. She could go to [facility] but she would have to be discharged first and seen outpatient. Or she would have to be transferred to [facility]. [facility] on-call notified.

## 2012-09-03 NOTE — Progress Notes (Signed)
[facility] PROGRESS NOTE  Karen Bennett ZOX:[id number] DOB: 1975-08-02 DOA: 09/02/2012 PCP: No primary provider on file.  Assessment/Plan: #1. Left foot cellulitis with possible osteomyelitis  - Will await further recommendations from Dr. Ophelia Charter.  CT of affected foot ordered - DVT preliminary results negative for DVT - continue current antibiotic regimen - x ray of foot interpreted as:  Extensive and increased soft tissue swelling adjacent to the  fourth and fifth metatarsals, with some progressive erosion of the  distal fifth metatarsal raising the possibility of osteomyelitis,  and a small amount of gas along the irregular soft tissue margins  favoring ulceration.  #2. Uncontrolled diabetes mellitus type 2 secondary to noncompliance  - Hemoglobin A1c 16.8  - Add Lantus 5 units qhs - Continue SSI  #3. Hyponatremia  - probably from uncontrolled diabetes and dehydration.  - continue to monitor  Code Status: full Family Communication: No family at bedside Disposition Plan: Pending specialist recommendations.   Consultants:  Ortho: Dr. Ophelia Charter  Procedures:  Doppler of lower extremities  Antibiotics:  Vancomycin and Zosyn  HPI/Subjective: Patient has no new complaints.  No acute issues reported overnight. Pain is currently tolerable  Objective: Filed Vitals:   09/03/12 0230 09/03/12 0356 09/03/12 0621 09/03/12 0829  BP: 143/62 133/66 119/57 118/56  Pulse: 81 75 82 87  Temp:  99.2 F (37.3 C) 99.9 F (37.7 C) 99.9 F (37.7 C)  TempSrc:  Oral Oral Oral  Resp: 18 18 18    Height:  5\' 8"  (1.727 m)    Weight:  177 kg (390 lb 3.4 oz)    SpO2: 98% 100% 100% 94%   No intake or output data in the 24 hours ending 09/03/12 1404 Filed Weights   09/03/12 0356  Weight: 177 kg (390 lb 3.4 oz)    Exam:   General:  Pt in NAD, Alert and Awake  Cardiovascular: RRR, no mrg  Respiratory: CTA BL,no wheezes  Abdomen: soft, ND, obese  Musculoskeletal: Left  foot wrapped in gauze  Data Reviewed: Basic Metabolic Panel:  Recent Labs Lab 09/02/12 2220 09/03/12 0505  NA 126* 127*  K 3.8 3.5  CL 90* 93*  CO2 23 24  GLUCOSE 339* 290*  BUN 9 9  CREATININE 0.47* 0.68  CALCIUM 8.9 8.7   Liver Function Tests: No results found for this basename: AST, ALT, ALKPHOS, BILITOT, PROT, ALBUMIN,  in the last 168 hours No results found for this basename: LIPASE, AMYLASE,  in the last 168 hours No results found for this basename: AMMONIA,  in the last 168 hours CBC:  Recent Labs Lab 09/02/12 2220 09/03/12 0505  WBC 10.4 10.9*  NEUTROABS 7.6  --   HGB 13.6 12.8  HCT 37.8 36.4  MCV 83.3 84.3  PLT 230 221   Cardiac Enzymes: No results found for this basename: CKTOTAL, CKMB, CKMBINDEX, TROPONINI,  in the last 168 hours BNP (last 3 results) No results found for this basename: PROBNP,  in the last 8760 hours CBG:  Recent Labs Lab 09/03/12 0300 09/03/12 0737 09/03/12 1125  GLUCAP 313* 270* 268*    Recent Results (from the past 240 hour(s))  SURGICAL PCR SCREEN     Status: Abnormal   Collection Time    09/03/12  3:58 AM      Result Value Range Status   MRSA, PCR NEGATIVE  NEGATIVE Final   Staphylococcus aureus POSITIVE (*) NEGATIVE Final   Comment:            The  Xpert SA Assay (FDA     approved for NASAL specimens     in patients over 37 years of age),     is one component of     a comprehensive surveillance     program.  Test performance has     been validated by [employer] for patients greater     than or equal to 81 year old.     It is not intended     to diagnose infection nor to     guide or monitor treatment.     Studies: Dg Foot Complete Left  09/02/2012  *RADIOLOGY REPORT*  Clinical Data: Diabetes.  Swelling.  Pain.  Wound infection.  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  Extensive  prominent overlying and plantar soft tissue swelling noted with a small amount of suspected gas in the soft tissues. Chronic fragmented spurring along the medial base of the remaining fourth metatarsal observed.  The there is some progressive erosion of the distal margin of the fifth metatarsal which could reflect osteomyelitis.  Dorsal midfoot spurring observed. Subcutaneous edema noted dorsally in the foot and ankle.  IMPRESSION:  1. Extensive and increased soft tissue swelling adjacent to the fourth and fifth metatarsals, with some progressive erosion of the distal fifth metatarsal raising the possibility of osteomyelitis, and a small amount of gas along the irregular soft tissue margins favoring ulceration. 2.  Subcutaneous edema noted dorsally in the foot and ankle.   Original Report Authenticated By: Gaylyn Rong, M.D.     Scheduled Meds: . insulin aspart  0-15 Units Subcutaneous TID WC  . piperacillin-tazobactam (ZOSYN)  IV  3.375 g Intravenous Q8H  . vancomycin  1,500 mg Intravenous Q12H   Continuous Infusions: . sodium chloride 125 mL/hr at 09/03/12 1335    Principal Problem:   Cellulitis Active Problems:   DM (diabetes mellitus), type 2, uncontrolled with complications   Hyponatremia   Osteomyelitis    Time spent: > 35 minutes    Penny Pia  [facility] Pager [phone] If 8PM-8AM, please contact night-coverage at [email or web address], password [facility] 09/03/2012, 2:04 PM  LOS: 1 day

## 2012-09-03 NOTE — ED Notes (Signed)
Dr. Kakrakandy, hospitalist, at bedside. 

## 2012-09-04 DIAGNOSIS — R7881 Bacteremia: Secondary | ICD-10-CM | POA: Clinically undetermined

## 2012-09-04 DIAGNOSIS — B9561 Methicillin susceptible Staphylococcus aureus infection as the cause of diseases classified elsewhere: Secondary | ICD-10-CM | POA: Clinically undetermined

## 2012-09-04 LAB — BASIC METABOLIC PANEL
BUN: 8 mg/dL (ref 6–23)
Calcium: 8.5 mg/dL (ref 8.4–10.5)
Chloride: 92 mEq/L — ABNORMAL LOW (ref 96–112)
Creatinine, Ser: 0.63 mg/dL (ref 0.50–1.10)
GFR calc Af Amer: >90 mL/min (ref >90)
GFR calc non Af Amer: >90 mL/min (ref >90)

## 2012-09-04 LAB — CBC
HCT: 34.1 % — ABNORMAL LOW (ref 36.0–46.0)
MCHC: 34.6 g/dL (ref 30.0–36.0)
Platelets: 221 10*3/uL (ref 150–400)
RDW: 12.6 % (ref 11.5–15.5)
WBC: 11.2 10*3/uL — ABNORMAL HIGH (ref 4.0–10.5)

## 2012-09-04 LAB — GLUCOSE, CAPILLARY
Glucose-Capillary: 297 mg/dL — ABNORMAL HIGH (ref 70–99)
Glucose-Capillary: 307 mg/dL — ABNORMAL HIGH (ref 70–99)
Glucose-Capillary: 323 mg/dL — ABNORMAL HIGH (ref 70–99)
Glucose-Capillary: 334 mg/dL — ABNORMAL HIGH (ref 70–99)

## 2012-09-04 MED ORDER — INSULIN ASPART PROT & ASPART (70-30 MIX) 100 UNIT/ML ~~LOC~~ SUSP
20.0000 [IU] | Freq: Two times a day (BID) | SUBCUTANEOUS | Status: DC
  Administered 2012-09-04 – 2012-09-05 (×2): 20 [IU] via SUBCUTANEOUS
  Filled 2012-09-04: qty 10

## 2012-09-04 MED ORDER — DOCUSATE SODIUM 100 MG PO CAPS
100.0000 mg | ORAL_CAPSULE | Freq: Two times a day (BID) | ORAL | Status: DC
  Administered 2012-09-04 – 2012-09-09 (×10): 100 mg via ORAL
  Filled 2012-09-04 (×12): qty 1

## 2012-09-04 NOTE — Progress Notes (Signed)
Patient currently watching diabetic videos 501-510.  Will assess for questions when finished.  Patient states that she is comfortable drawing up and injecting herself with insulin, but will allow her to demonstrate at dinner time when she is due for insulin.  Will continue to monitor.

## 2012-09-04 NOTE — Progress Notes (Signed)
RN has called ortho tech and informed them that patient needs a left foot darco shoe.

## 2012-09-04 NOTE — Progress Notes (Signed)
[facility] PROGRESS NOTE  [place] Krauss ZOX:[id number] DOB: Feb 12, 1976 DOA: 09/02/2012  PCP: No PCP  Brief HPI: 37 year-old female with history of diabetes mellitus type 2 not on medication secondary to financial issues presented with complaint of worsening swelling pain and discharge from left foot. 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 In the ER patient had x-ray of the left foot which shows possibility of osteomyelitis and on clinical exam patient has significant swelling concerning for cellulitis. Patient's blood sugar is also uncontrolled.  Past medical history:  Past Medical History  Diagnosis Date  . Cellulitis   . Diabetes mellitus   . PE (pulmonary embolism) ~2007-2008    Not on anticoagulation    Consultants: Ortho (Dr. Ophelia Charter)  Procedures: None yet  Antibiotics: IV Vanc 2/10--> IV Zosyn 2/10-->  Subjective: Patient complains of 10/10 pain in left foot. No other complaints offered.  Objective: Vital Signs  Filed Vitals:   09/03/12 0829 09/03/12 1540 09/03/12 2200 09/04/12 0640  BP: 118/56 134/67 151/83 148/78  Pulse: 87 77 85 95  Temp: 99.9 F (37.7 C) 98.8 F (37.1 C) 99.3 F (37.4 C) 99.4 F (37.4 C)  TempSrc: Oral  Oral Oral  Resp:   20 20  Height:      Weight:      SpO2: 94% 94% 97% 91%    Intake/Output Summary (Last 24 hours) at 09/04/12 1053 Last data filed at 09/04/12 0500  Gross per 24 hour  Intake 3532.08 ml  Output      0 ml  Net 3532.08 ml   Filed Weights   09/03/12 0356  Weight: 177 kg (390 lb 3.4 oz)    General appearance: alert, cooperative, appears stated age and morbidly obese Resp: clear to auscultation bilaterally Cardio: regular rate and rhythm, S1, S2 normal, no murmur, click, rub or gallop GI: soft, non-tender; bowel sounds normal; no masses,  no organomegaly Extremities: left foot covered with dressing Pulses: 2+ and symmetric Neurologic: Alert and  oriented x 3. No focal deficits.  Lab Results:  Basic Metabolic Panel:  Recent Labs Lab 09/02/12 2220 09/03/12 0505 09/04/12 0512  NA 126* 127* 125*  K 3.8 3.5 3.8  CL 90* 93* 92*  CO2 23 24 23   GLUCOSE 339* 290* 330*  BUN 9 9 8   CREATININE 0.47* 0.68 0.63  CALCIUM 8.9 8.7 8.5   CBC:  Recent Labs Lab 09/02/12 2220 09/03/12 0505 09/04/12 0512  WBC 10.4 10.9* 11.2*  NEUTROABS 7.6  --   --   HGB 13.6 12.8 11.8*  HCT 37.8 36.4 34.1*  MCV 83.3 84.3 84.6  PLT 230 221 221   CBG:  Recent Labs Lab 09/03/12 1632 09/03/12 2010 09/04/12 0002 09/04/12 0418 09/04/12 0748  GLUCAP 312* 288* 297* 307* 268*    Recent Results (from the past 240 hour(s))  CULTURE, BLOOD (ROUTINE X 2)     Status: None   Collection Time    09/02/12 10:20 PM      Result Value Range Status   Specimen Description BLOOD RIGHT WRIST   Final   Special Requests BOTTLES DRAWN AEROBIC AND ANAEROBIC   Final   Culture  Setup Time 09/03/2012 03:48   Final   Culture     Final   Value:        BLOOD CULTURE RECEIVED NO GROWTH TO DATE CULTURE WILL BE HELD FOR 5 DAYS BEFORE ISSUING A FINAL NEGATIVE REPORT  Report Status PENDING   Incomplete  CULTURE, BLOOD (ROUTINE X 2)     Status: None   Collection Time    09/02/12 10:30 PM      Result Value Range Status   Specimen Description BLOOD LEFT ARM   Final   Special Requests BOTTLES DRAWN AEROBIC AND ANAEROBIC 5CC   Final   Culture  Setup Time 09/03/2012 03:48   Final   Culture     Final   Value: GRAM POSITIVE COCCI IN CLUSTERS     Note: Gram Stain Report Called to,Read Back By and Verified With: CINDY SHELTON @0418  ON 09/04/12 BY MCLET   Report Status PENDING   Incomplete  SURGICAL PCR SCREEN     Status: Abnormal   Collection Time    09/03/12  3:58 AM      Result Value Range Status   MRSA, PCR NEGATIVE  NEGATIVE Final   Staphylococcus aureus POSITIVE (*) NEGATIVE Final   Comment:            The Xpert SA Assay (FDA     approved for NASAL specimens      in patients over 13 years of age),     is one component of     a comprehensive surveillance     program.  Test performance has     been validated by [employer] for patients greater     than or equal to 5 year old.     It is not intended     to diagnose infection nor to     guide or monitor treatment.      Studies/Results: Ct Foot Left W Contrast  09/04/2012  *RADIOLOGY REPORT*  Clinical Data: Diabetic with left foot infection.  Question abscess.  History of partial amputation.  CT OF THE LEFT FOOT WITH CONTRAST  Technique:  Multidetector CT imaging was performed following the standard protocol during bolus administration of intravenous contrast.  Contrast: OMNIPAQUE IOHEXOL 300 MG/ML  SOLN  Comparison: Radiographs 11/10/2011 and 09/02/2012.  CT 09/05/2011.  Findings: There are new extensive inflammatory changes within the remaining lateral forefoot.  There is a focal abscess adjacent to the remnant of the fifth metatarsal.  This measures 1.4 x 2.2 x 1.4 cm and shows peripheral enhancement.  Peripheral to this well- circumscribed fluid collection is irregular enhancing soft tissue, likely a phlegmon. A small amount of peripherally enhancing fluid appears to extend along the plantar aspect of the Lisfranc joint. No soft tissue emphysema or foreign body is identified.  The remnants of the fourth and fifth metatarsals appear stable status post partial amputation.  There is no evidence of progressive osteomyelitis.  Midfoot degenerative changes are stable.  There is diffuse muscular atrophy throughout the foot.  IMPRESSION:  1.  Recurrent soft tissue infection laterally in the foot with focal abscess adjacent to the remnant of the fifth metatarsal. There is probable extension of infection along the plantar aspect of the Lisfranc joint. 2.  No evidence of recurrent osteomyelitis.   Original Report Authenticated By: Carey Bullocks, M.D.    Dg Foot Complete Left  09/02/2012  *RADIOLOGY  REPORT*  Clinical Data: Diabetes.  Swelling.  Pain.  Wound infection.  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  Extensive prominent overlying and plantar soft tissue swelling noted with a small amount of suspected gas in the  soft tissues. Chronic fragmented spurring along the medial base of the remaining fourth metatarsal observed.  The there is some progressive erosion of the distal margin of the fifth metatarsal which could reflect osteomyelitis.  Dorsal midfoot spurring observed. Subcutaneous edema noted dorsally in the foot and ankle.  IMPRESSION:  1. Extensive and increased soft tissue swelling adjacent to the fourth and fifth metatarsals, with some progressive erosion of the distal fifth metatarsal raising the possibility of osteomyelitis, and a small amount of gas along the irregular soft tissue margins favoring ulceration. 2.  Subcutaneous edema noted dorsally in the foot and ankle.   Original Report Authenticated By: Gaylyn Rong, M.D.     Medications:  Scheduled: . insulin aspart  0-15 Units Subcutaneous TID WC  . insulin aspart protamine-insulin aspart  20 Units Subcutaneous BID WC  . piperacillin-tazobactam (ZOSYN)  IV  3.375 g Intravenous Q8H  . vancomycin  1,500 mg Intravenous Q12H   Continuous: . sodium chloride 125 mL/hr at 09/03/12 1335   YQM:[id number], acetaminophen, HYDROmorphone (DILAUDID) injection, oxyCODONE, promethazine  Assessment/Plan:  Principal Problem:   Cellulitis Active Problems:   DM (diabetes mellitus), type 2, uncontrolled with complications   Hyponatremia   Osteomyelitis    Left foot cellulitis with focal abscess.  No osteomyelitis noted on CT. Plan is for debridement at bedside today. Continue antibiotics. Await blood culture reports. Pain control. Doppler negative for DVT.   Bacteremia In one of two bottles. Possibly  a contaminant. Will wait for final ID.  Uncontrolled diabetes mellitus type 2 secondary to noncompliance  Hemoglobin A1c 16.8. Start Insulin 70/30 as she will not be able to afford any other insulin. Continue SSI   Hyponatremia  Probably from uncontrolled diabetes and dehydration. Continue IVF. Continue to monitor   DVT Prophylaxis SCD's  Code Status: full Code Family Communication: No family at bedside  Disposition Plan: Unclear for now. Likely discharge in 1-2 days    LOS: 2 days   [facility]  [facility] Pager [phone] 09/04/2012, 10:53 AM  If 8PM-8AM, please contact night-coverage at [email or web address], password [facility]

## 2012-09-04 NOTE — Plan of Care (Signed)
Problem: Food- and Nutrition-Related Knowledge Deficit (NB-1.1) Goal: Nutrition education Formal process to instruct or train a patient/client in a skill or to impart knowledge to help patients/clients voluntarily manage or modify food choices and eating behavior to maintain or improve health. Outcome: Completed/Met Date Met:  09/04/12 Pt known to RD from previous admission in April 2013. Pt with HbA1c 16.8% (09/03/12). Pt had met with RD during previous admission regarding healthy eating on a budget and diabetic diet. Pt able to state in current admission how to appropriately count carbohydrates and identify sources of carbohydrates in the diet. Pt reports she has been trying to eat healthy and drinks only water and unsweet tea. Pt reports her biggest portions at mealtimes are of protein and vegetables, not carbohydrates. Pt reports her biggest barrier to following diabetic diet is being on Food Stamps and getting around $180/month. Provided pt with extensive detailed handouts that discuss healthy eating on a budget with food stamps that discuss ways to save money like buying in bulk and less expensive healthy eating choices. Also provided diabetic diet handout for pt to review. Teach back method used. Pt expressed understanding. Expect good compliance. Pt's weight down 23 pounds in the past 10 months which pt attributes to eating healthier. Pt had questions regarding Medicaid, called social worker and left message regarding pt's request.     Wt Readings from Last 10 Encounters:  09/03/12 390 lb 3.4 oz (177 kg)  11/21/11 413 lb (187.336 kg)  11/10/11 395 lb 11.2 oz (179.488 kg)  11/10/11 395 lb 11.2 oz (179.488 kg)  09/05/11 418 lb (189.604 kg)  08/16/11 416 lb 3.7 oz (188.8 kg)  06/12/07 501 lb 14.4 oz (227.66 kg)

## 2012-09-04 NOTE — Progress Notes (Addendum)
Inpatient Diabetes Program Recommendations  AACE/ADA: New Consensus Statement on Inpatient Glycemic Control (2013)  Target Ranges:  Prepandial:   less than 140 mg/dL      Peak postprandial:   less than 180 mg/dL (1-2 hours)      Critically ill patients:  140 - 180 mg/dL    Patient admitted with cellulitis and uncontrolled diabetes.  A1c 16.8% (09/03/12).  Not taking any diabetes medications at home due to financial issues.  Does not have a PCP.  Spoke with patient about her A1c today.  Explained what an A1c is and what it measures.  Reminded patient that uncontrolled CBGs can lead to devastating complications including blindness, kidney failure, neuropathy, etc.  Patient became tearful and told me that she wants to control her blood sugars but that [place] and has no means of transportation and no finances.  Patient does not work.  Receives food stamps from [employer].  Cannot afford any medications and cannot afford to pay her bills.  Per patient, she has a friend that can drive her every now and then, but that friend has a job and works during daytime hours and cannot take the patient to the MD for visits.  Patient also told me she has a female friend who pays her rent and her electricity bills.  Noted care management consult placed yesterday.  Spoke with patient about the fact that she will be discharged home on insulin.  Reminded patient that she can get the insulin we will prescribe for her at [facility] for $25 per vial.  MD to start patient on 70/30 insulin today.  This insulin can be purchased at [facility] for $25 per vial (Novolin Reli-on 70/30 insulin).  Patient again became tearful and told me she could not afford $25.    Have asked RNs to have patient review the DM videos on the patient education network.  Have also asked the RNs to provide a review of insulin instruction for this patient since she will be d/c'd home on insulin.  Will order RD consult to see if we  can provide information on healthy food choices on a limited budget for glucose control and weight loss.  Gave patient basic diabetes informational pamphlets on insulin, A1c and how to prevent long-term complications.    Note: Will follow. Ambrose Finland RN, MSN, CDE Diabetes Coordinator Inpatient Diabetes Program [phone]

## 2012-09-04 NOTE — Progress Notes (Signed)
Patient able to draw up and inject herself with insulin without any difficulty.

## 2012-09-05 LAB — CBC
HCT: 32.7 % — ABNORMAL LOW (ref 36.0–46.0)
Hemoglobin: 11 g/dL — ABNORMAL LOW (ref 12.0–15.0)
MCHC: 33.6 g/dL (ref 30.0–36.0)
MCV: 85.4 fL (ref 78.0–100.0)

## 2012-09-05 LAB — GLUCOSE, CAPILLARY
Glucose-Capillary: 297 mg/dL — ABNORMAL HIGH (ref 70–99)
Glucose-Capillary: 304 mg/dL — ABNORMAL HIGH (ref 70–99)

## 2012-09-05 LAB — BASIC METABOLIC PANEL
BUN: 8 mg/dL (ref 6–23)
CO2: 25 mEq/L (ref 19–32)
Chloride: 94 mEq/L — ABNORMAL LOW (ref 96–112)
Creatinine, Ser: 0.62 mg/dL (ref 0.50–1.10)
GFR calc Af Amer: >90 mL/min (ref >90)
Potassium: 3.6 mEq/L (ref 3.5–5.1)

## 2012-09-05 LAB — VANCOMYCIN, TROUGH: Vancomycin Tr: 7 ug/mL — ABNORMAL LOW (ref 10.0–20.0)

## 2012-09-05 MED ORDER — HYDROMORPHONE HCL PF 1 MG/ML IJ SOLN
1.0000 mg | Freq: Once | INTRAMUSCULAR | Status: AC
  Administered 2012-09-05: 1 mg via INTRAVENOUS
  Filled 2012-09-05: qty 1

## 2012-09-05 MED ORDER — INSULIN ASPART PROT & ASPART (70-30 MIX) 100 UNIT/ML ~~LOC~~ SUSP
25.0000 [IU] | Freq: Two times a day (BID) | SUBCUTANEOUS | Status: DC
  Administered 2012-09-05 – 2012-09-06 (×2): 25 [IU] via SUBCUTANEOUS

## 2012-09-05 MED ORDER — VANCOMYCIN HCL 10 G IV SOLR
1250.0000 mg | Freq: Three times a day (TID) | INTRAVENOUS | Status: DC
  Administered 2012-09-06: 1250 mg via INTRAVENOUS
  Filled 2012-09-05 (×3): qty 1250

## 2012-09-05 NOTE — Progress Notes (Signed)
Inpatient Diabetes Program Recommendations  AACE/ADA: New Consensus Statement on Inpatient Glycemic Control (2013)  Target Ranges:  Prepandial:   less than 140 mg/dL      Peak postprandial:   less than 180 mg/dL (1-2 hours)      Critically ill patients:  140 - 180 mg/dL    Results for ISSABELLE, MCRANEY (MRN [id number]) as of 09/05/2012 12:01  Ref. Range 09/04/2012 00:02 09/04/2012 04:18 09/04/2012 07:48 09/04/2012 11:58 09/04/2012 17:09 09/04/2012 20:19  Glucose-Capillary Latest Range: 70-99 mg/dL 782 (H) 956 (H) 213 (H) 323 (H) 298 (H) 334 (H)    Results for KATHE, WIRICK (MRN [id number]) as of 09/05/2012 12:01  Ref. Range 09/05/2012 00:13 09/05/2012 04:01 09/05/2012 07:54  Glucose-Capillary Latest Range: 70-99 mg/dL 629 (H) 528 (H) 413 (H)   Noted patient received 1st dose 70/30 insulin last night with supper.  RNs have confirmed patient is able to draw up and administer insulin via vial and syringe safely.  Noted RD had visit with patient to re-emphasize carbohydrate counting with patient and eating on a budget.  Noted Dr. Rito Ehrlich increased 70/30 insulin today.  Patient received 20 units 70/30 this morning.  Will get 25 units 70/30 tonight with supper.  May want to go ahead and increase SSI to Resistant scale tid ac + HS.  Will follow. Ambrose Finland RN, MSN, CDE Diabetes Coordinator Inpatient Diabetes Program [phone]

## 2012-09-05 NOTE — Progress Notes (Signed)
ANTIBIOTIC CONSULT NOTE - FOLLOW UP  Pharmacy Consult for Vanc, Zosyn Indication: cellulitis, r/o osteo  Allergies  Allergen Reactions  . Zofran Itching and Nausea And Vomiting  . Doxycycline Nausea And Vomiting  . Morphine And Related Itching   Patient Measurements: Height: 5\' 8"  (172.7 cm) Weight: 390 lb 3.4 oz (177 kg) IBW/kg (Calculated) : 63.9  Vital Signs: Temp: 99.9 F (37.7 C) (02/13 1358) Temp src: Oral (02/13 1358) BP: 132/56 mmHg (02/13 1358) Pulse Rate: 73 (02/13 1358) Intake/Output from previous day: 02/12 0701 - 02/13 0700 In: 2281.3 [P.O.:960; I.V.:1171.3; IV Piggyback:150] Out: 300 [Urine:300] Intake/Output from this shift: Total I/O In: 4970 [P.O.:600; I.V.:2720; IV Piggyback:1650] Out: -   Labs:  Recent Labs  09/03/12 0505 09/04/12 0512 09/05/12 0520  WBC 10.9* 11.2* 9.1  HGB 12.8 11.8* 11.0*  PLT 221 221 249  CREATININE 0.68 0.63 0.62   Estimated Creatinine Clearance: 169 ml/min (by C-G formula based on Cr of 0.62).  Recent Labs  09/05/12 1719  VANCOTROUGH 7.0*   Assessment:  36 yof with long-standing chronic diabetic ulceration of L foot, s/p fourth and fifth toe amputations in 2013 and underwent I&D 10/2011 presented 09/02/12 with L foot cellulitis. X-ray of L foot shows possibility of osteo. MRI cannot be completed to r/o osteo given patient's weight.  Day #4 Vanc and Zosyn.  Tmax is 99.9, WBC wnl, SCr wnl.  CrCl > 144ml/min.  Vanc trough below target range on 1500mg  IV q12h.  Goal of Therapy:  Vancomycin trough level 15-20 mcg/ml  Plan:   Change Vanc to 1250mg  IV q8h.  Continue Zosyn 3.375g IV Q8H infused over 4hrs. Measure Vanc trough at steady state. Follow up renal fxn and culture results.  Charolotte Eke, PharmD, pager [phone]. 09/05/2012,6:43 PM.

## 2012-09-05 NOTE — Progress Notes (Signed)
Dressing changed to left foot.  Old dressing with minimal amount of serous/purulent drainage noted.  Wound cleansed with normal saline, new wet to dry dressing applied with kerlix.  Patient tolerated well.  Left foot elevated on a pillow.  Darco shoe currently in patient's room does not fit patient.  Will continue to monitor.

## 2012-09-05 NOTE — Progress Notes (Signed)
[facility] PROGRESS NOTE  [name] ZOX:[id number] DOB: 1976-02-01 DOA: 09/02/2012  PCP: No PCP  Brief HPI: 37 year-old female with history of diabetes mellitus type 2 not on medication secondary to financial issues presented with complaint of worsening swelling pain and discharge from left foot. 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 In the ER patient had x-ray of the left foot which shows possibility of osteomyelitis and on clinical exam patient has significant swelling concerning for cellulitis. Patient's blood sugar is also uncontrolled.  Past medical history:  Past Medical History  Diagnosis Date  . Cellulitis   . Diabetes mellitus   . PE (pulmonary embolism) ~2007-2008    Not on anticoagulation    Consultants: Ortho (Dr. Ophelia Charter)  Procedures: None yet  Antibiotics: IV Vanc 2/10--> IV Zosyn 2/10-->  Subjective: Patient continues to have 10/10 pain in left foot. IV in left arm infiltrated and left arm is swollen.   Objective: Vital Signs  Filed Vitals:   09/04/12 0640 09/04/12 1621 09/04/12 2207 09/05/12 0535  BP: 148/78 148/73 130/93 136/67  Pulse: 95 86 88 81  Temp: 99.4 F (37.4 C) 100.1 F (37.8 C) 98 F (36.7 C) 98.5 F (36.9 C)  TempSrc: Oral Oral Oral Oral  Resp: 20 20 20 20   Height:      Weight:      SpO2: 91% 95% 92% 92%    Intake/Output Summary (Last 24 hours) at 09/05/12 1040 Last data filed at 09/05/12 0827  Gross per 24 hour  Intake 2641.25 ml  Output    300 ml  Net 2341.25 ml   Filed Weights   09/03/12 0356  Weight: 177 kg (390 lb 3.4 oz)    General appearance: alert, cooperative, appears stated age and morbidly obese Resp: clear to auscultation bilaterally Cardio: regular rate and rhythm, S1, S2 normal, no murmur, click, rub or gallop GI: soft, non-tender; bowel sounds normal; no masses,  no organomegaly Extremities: left foot covered with dressing. Left arm is  swollen with good pulses. No erythema. Pulses: 2+ and symmetric Neurologic: Alert and oriented x 3. No focal deficits.  Lab Results:  Basic Metabolic Panel:  Recent Labs Lab 09/02/12 2220 09/03/12 0505 09/04/12 0512 09/05/12 0520  NA 126* 127* 125* 126*  K 3.8 3.5 3.8 3.6  CL 90* 93* 92* 94*  CO2 23 24 23 25   GLUCOSE 339* 290* 330* 315*  BUN 9 9 8 8   CREATININE 0.47* 0.68 0.63 0.62  CALCIUM 8.9 8.7 8.5 8.8   CBC:  Recent Labs Lab 09/02/12 2220 09/03/12 0505 09/04/12 0512 09/05/12 0520  WBC 10.4 10.9* 11.2* 9.1  NEUTROABS 7.6  --   --   --   HGB 13.6 12.8 11.8* 11.0*  HCT 37.8 36.4 34.1* 32.7*  MCV 83.3 84.3 84.6 85.4  PLT 230 221 221 249   CBG:  Recent Labs Lab 09/04/12 1709 09/04/12 2019 09/05/12 0013 09/05/12 0401 09/05/12 0754  GLUCAP 298* 334* 297* 304* 287*    Recent Results (from the past 240 hour(s))  CULTURE, BLOOD (ROUTINE X 2)     Status: None   Collection Time    09/02/12 10:20 PM      Result Value Range Status   Specimen Description BLOOD RIGHT WRIST   Final   Special Requests BOTTLES DRAWN AEROBIC AND ANAEROBIC   Final   Culture  Setup Time 09/03/2012 03:48   Final   Culture  Final   Value:        BLOOD CULTURE RECEIVED NO GROWTH TO DATE CULTURE WILL BE HELD FOR 5 DAYS BEFORE ISSUING A FINAL NEGATIVE REPORT   Report Status PENDING   Incomplete  CULTURE, BLOOD (ROUTINE X 2)     Status: None   Collection Time    09/02/12 10:30 PM      Result Value Range Status   Specimen Description BLOOD LEFT ARM   Final   Special Requests BOTTLES DRAWN AEROBIC AND ANAEROBIC 5CC   Final   Culture  Setup Time 09/03/2012 03:48   Final   Culture     Final   Value: GRAM POSITIVE COCCI IN CLUSTERS     Note: Gram Stain Report Called to,Read Back By and Verified With: CINDY SHELTON @0418  ON 09/04/12 BY MCLET   Report Status PENDING   Incomplete  SURGICAL PCR SCREEN     Status: Abnormal   Collection Time    09/03/12  3:58 AM      Result Value Range  Status   MRSA, PCR NEGATIVE  NEGATIVE Final   Staphylococcus aureus POSITIVE (*) NEGATIVE Final   Comment:            The Xpert SA Assay (FDA     approved for NASAL specimens     in patients over 32 years of age),     is one component of     a comprehensive surveillance     program.  Test performance has     been validated by [employer] for patients greater     than or equal to 47 year old.     It is not intended     to diagnose infection nor to     guide or monitor treatment.      Studies/Results: Ct Foot Left W Contrast  09/04/2012  *RADIOLOGY REPORT*  Clinical Data: Diabetic with left foot infection.  Question abscess.  History of partial amputation.  CT OF THE LEFT FOOT WITH CONTRAST  Technique:  Multidetector CT imaging was performed following the standard protocol during bolus administration of intravenous contrast.  Contrast: OMNIPAQUE IOHEXOL 300 MG/ML  SOLN  Comparison: Radiographs 11/10/2011 and 09/02/2012.  CT 09/05/2011.  Findings: There are new extensive inflammatory changes within the remaining lateral forefoot.  There is a focal abscess adjacent to the remnant of the fifth metatarsal.  This measures 1.4 x 2.2 x 1.4 cm and shows peripheral enhancement.  Peripheral to this well- circumscribed fluid collection is irregular enhancing soft tissue, likely a phlegmon. A small amount of peripherally enhancing fluid appears to extend along the plantar aspect of the Lisfranc joint. No soft tissue emphysema or foreign body is identified.  The remnants of the fourth and fifth metatarsals appear stable status post partial amputation.  There is no evidence of progressive osteomyelitis.  Midfoot degenerative changes are stable.  There is diffuse muscular atrophy throughout the foot.  IMPRESSION:  1.  Recurrent soft tissue infection laterally in the foot with focal abscess adjacent to the remnant of the fifth metatarsal. There is probable extension of infection along the plantar aspect  of the Lisfranc joint. 2.  No evidence of recurrent osteomyelitis.   Original Report Authenticated By: Carey Bullocks, M.D.     Medications:  Scheduled: . docusate sodium  100 mg Oral BID  . insulin aspart  0-15 Units Subcutaneous TID WC  . insulin aspart protamine-insulin aspart  20 Units Subcutaneous BID WC  .  piperacillin-tazobactam (ZOSYN)  IV  3.375 g Intravenous Q8H  . vancomycin  1,500 mg Intravenous Q12H   Continuous: . sodium chloride 100 mL/hr (09/04/12 1151)   ZOX:[id number], acetaminophen, HYDROmorphone (DILAUDID) injection, oxyCODONE, promethazine  Assessment/Plan:  Principal Problem:   Cellulitis Active Problems:   DM (diabetes mellitus), type 2, uncontrolled with complications   Hyponatremia   Osteomyelitis   Bacteremia    Left foot cellulitis with focal abscess.  No osteomyelitis noted on CT. Plan is for debridement at bedside. Continue antibiotics. Await blood culture reports. Pain control. Doppler negative for DVT.   Bacteremia In one of two bottles. Possibly a contaminant. Will wait for final ID.  Uncontrolled diabetes mellitus type 2 secondary to noncompliance  Hemoglobin A1c 16.8. Start Insulin 70/30 as she will not be able to afford any other insulin. Continue SSI. Increase dose of 70/30.   Hyponatremia  Not much improvement. Probably from uncontrolled diabetes and dehydration. Continue IVF. Continue to monitor   DVT Prophylaxis SCD's  Code Status: full Code Family Communication: No family at bedside  Disposition Plan: Unclear for now.    LOS: 3 days   [facility]  [facility] Pager [phone] 09/05/2012, 10:40 AM  If 8PM-8AM, please contact night-coverage at [email or web address], password [facility]

## 2012-09-05 NOTE — Progress Notes (Signed)
Left forearm IV infiltrate.  Iv in left arm removed. New IV placed in right wrist.  Left arm swollen to +2 nonpitting edema.  As per patient her arm feels tight, but does not hurt.  Positive left radial pulse.  Left arm elevated on pillows.  Dr. Rito Ehrlich aware and saw patient.  Will monitor.

## 2012-09-06 ENCOUNTER — Encounter (HOSPITAL_COMMUNITY)
Admission: EM | Disposition: A | Discharge status: Home Health | Payer: Self-pay | Source: Home / Self Care | Attending: Internal Medicine

## 2012-09-06 ENCOUNTER — Encounter (HOSPITAL_COMMUNITY): Payer: Self-pay | Admitting: Anesthesiology

## 2012-09-06 ENCOUNTER — Inpatient Hospital Stay (HOSPITAL_COMMUNITY): Payer: Medicaid Other

## 2012-09-06 ENCOUNTER — Inpatient Hospital Stay (HOSPITAL_COMMUNITY): Payer: MEDICAID | Admitting: Anesthesiology

## 2012-09-06 DIAGNOSIS — A4901 Methicillin susceptible Staphylococcus aureus infection, unspecified site: Secondary | ICD-10-CM | POA: Diagnosis present

## 2012-09-06 DIAGNOSIS — L02619 Cutaneous abscess of unspecified foot: Secondary | ICD-10-CM

## 2012-09-06 DIAGNOSIS — L03119 Cellulitis of unspecified part of limb: Secondary | ICD-10-CM

## 2012-09-06 DIAGNOSIS — I517 Cardiomegaly: Secondary | ICD-10-CM

## 2012-09-06 HISTORY — PX: I & D EXTREMITY: SHX5045

## 2012-09-06 LAB — BASIC METABOLIC PANEL
BUN: 7 mg/dL (ref 6–23)
Calcium: 8.7 mg/dL (ref 8.4–10.5)
GFR calc Af Amer: >90 mL/min (ref >90)
GFR calc non Af Amer: >90 mL/min (ref >90)
Glucose, Bld: 234 mg/dL — ABNORMAL HIGH (ref 70–99)
Potassium: 3.3 mEq/L — ABNORMAL LOW (ref 3.5–5.1)

## 2012-09-06 LAB — CBC
HCT: 32.1 % — ABNORMAL LOW (ref 36.0–46.0)
MCH: 28.9 pg (ref 26.0–34.0)
MCHC: 33.6 g/dL (ref 30.0–36.0)
RDW: 12.5 % (ref 11.5–15.5)

## 2012-09-06 LAB — CULTURE, BLOOD (ROUTINE X 2)

## 2012-09-06 LAB — GLUCOSE, CAPILLARY
Glucose-Capillary: 237 mg/dL — ABNORMAL HIGH (ref 70–99)
Glucose-Capillary: 180 mg/dL — ABNORMAL HIGH (ref 70–99)
Glucose-Capillary: 156 mg/dL — ABNORMAL HIGH (ref 70–99)

## 2012-09-06 SURGERY — IRRIGATION AND DEBRIDEMENT EXTREMITY
Anesthesia: General | Site: Foot | Laterality: Left | Wound class: Dirty or Infected

## 2012-09-06 MED ORDER — HYDROMORPHONE HCL PF 1 MG/ML IJ SOLN
1.0000 mg | INTRAMUSCULAR | Status: DC | PRN
  Administered 2012-09-06 – 2012-09-09 (×19): 1 mg via INTRAVENOUS
  Filled 2012-09-06 (×19): qty 1

## 2012-09-06 MED ORDER — CEFAZOLIN SODIUM-DEXTROSE 2-3 GM-% IV SOLR
2.0000 g | Freq: Three times a day (TID) | INTRAVENOUS | Status: DC
  Administered 2012-09-06 – 2012-09-09 (×10): 2 g via INTRAVENOUS
  Filled 2012-09-06 (×11): qty 50

## 2012-09-06 MED ORDER — FENTANYL CITRATE 0.05 MG/ML IJ SOLN: 25.0000 ug | INTRAMUSCULAR | Status: DC | PRN

## 2012-09-06 MED ORDER — LACTATED RINGERS IV SOLN
INTRAVENOUS | Status: DC | PRN
  Administered 2012-09-06: 16:00:00 via INTRAVENOUS

## 2012-09-06 MED ORDER — PROPOFOL 10 MG/ML IV BOLUS
INTRAVENOUS | Status: DC | PRN
  Administered 2012-09-06: 200 mg via INTRAVENOUS

## 2012-09-06 MED ORDER — HYDROMORPHONE HCL PF 1 MG/ML IJ SOLN: 1.0000 mg | INTRAMUSCULAR | Status: DC | PRN

## 2012-09-06 MED ORDER — LORAZEPAM 1 MG PO TABS
1.0000 mg | ORAL_TABLET | Freq: Once | ORAL | Status: AC
  Administered 2012-09-06: 1 mg via ORAL
  Filled 2012-09-06: qty 1

## 2012-09-06 MED ORDER — ACETAMINOPHEN 10 MG/ML IV SOLN
INTRAVENOUS | Status: DC | PRN
  Administered 2012-09-06: 1000 mg via INTRAVENOUS

## 2012-09-06 MED ORDER — SODIUM CHLORIDE 0.9 % IJ SOLN
10.0000 mL | Freq: Two times a day (BID) | INTRAMUSCULAR | Status: DC
  Administered 2012-09-07: 12 mL
  Administered 2012-09-08: 10 mL

## 2012-09-06 MED ORDER — MIDAZOLAM HCL 5 MG/5ML IJ SOLN
INTRAMUSCULAR | Status: DC | PRN
  Administered 2012-09-06: 2 mg via INTRAVENOUS

## 2012-09-06 MED ORDER — HYDROMORPHONE HCL PF 1 MG/ML IJ SOLN
1.0000 mg | INTRAMUSCULAR | Status: DC | PRN
  Administered 2012-09-06 (×2): 1 mg via INTRAMUSCULAR
  Filled 2012-09-06 (×3): qty 1

## 2012-09-06 MED ORDER — INSULIN ASPART PROT & ASPART (70-30 MIX) 100 UNIT/ML ~~LOC~~ SUSP
30.0000 [IU] | Freq: Two times a day (BID) | SUBCUTANEOUS | Status: DC
  Administered 2012-09-07 – 2012-09-09 (×5): 30 [IU] via SUBCUTANEOUS

## 2012-09-06 MED ORDER — SODIUM CHLORIDE 0.9 % IJ SOLN
10.0000 mL | INTRAMUSCULAR | Status: DC | PRN
  Administered 2012-09-09: 10 mL

## 2012-09-06 MED ORDER — FENTANYL CITRATE 0.05 MG/ML IJ SOLN
INTRAMUSCULAR | Status: DC | PRN
  Administered 2012-09-06: 50 ug via INTRAVENOUS

## 2012-09-06 MED ORDER — VANCOMYCIN HCL 1000 MG IV SOLR
1000.0000 mg | INTRAVENOUS | Status: DC | PRN
  Administered 2012-09-06: 1000 mg via INTRAVENOUS

## 2012-09-06 MED ORDER — LIDOCAINE HCL (CARDIAC) 20 MG/ML IV SOLN
INTRAVENOUS | Status: DC | PRN
  Administered 2012-09-06: 100 mg via INTRAVENOUS

## 2012-09-06 MED ORDER — POTASSIUM CHLORIDE 10 MEQ/100ML IV SOLN
10.0000 meq | INTRAVENOUS | Status: AC
  Administered 2012-09-06 (×4): 10 meq via INTRAVENOUS
  Filled 2012-09-06 (×4): qty 100

## 2012-09-06 MED ORDER — LACTATED RINGERS IV SOLN: INTRAVENOUS | Status: DC

## 2012-09-06 SURGICAL SUPPLY — 33 items
BAG SPEC THK2 15X12 ZIP CLS (MISCELLANEOUS) ×1
BAG ZIPLOCK 12X15 (MISCELLANEOUS) ×2 IMPLANT
BANDAGE ESMARK 6X9 LF (GAUZE/BANDAGES/DRESSINGS) ×1 IMPLANT
BANDAGE GAUZE ELAST BULKY 4 IN (GAUZE/BANDAGES/DRESSINGS) ×2 IMPLANT
BNDG CMPR 9X6 STRL LF SNTH (GAUZE/BANDAGES/DRESSINGS) ×1
BNDG COHESIVE 6X5 TAN STRL LF (GAUZE/BANDAGES/DRESSINGS) ×1 IMPLANT
BNDG ESMARK 6X9 LF (GAUZE/BANDAGES/DRESSINGS) ×2
CLOTH BEACON ORANGE TIMEOUT ST (SAFETY) ×2 IMPLANT
CUFF TOURN SGL QUICK 18 (TOURNIQUET CUFF) IMPLANT
CUFF TOURN SGL QUICK 24 (TOURNIQUET CUFF)
CUFF TOURN SGL QUICK 34 (TOURNIQUET CUFF)
CUFF TRNQT CYL 24X4X40X1 (TOURNIQUET CUFF) IMPLANT
CUFF TRNQT CYL 34X4X40X1 (TOURNIQUET CUFF) IMPLANT
DRAIN PENROSE 18X1/2 LTX STRL (DRAIN) ×2 IMPLANT
DRSG PAD ABDOMINAL 8X10 ST (GAUZE/BANDAGES/DRESSINGS) ×3 IMPLANT
DURAPREP 26ML APPLICATOR (WOUND CARE) ×2 IMPLANT
ELECT REM PT RETURN 9FT ADLT (ELECTROSURGICAL) ×2
ELECTRODE REM PT RTRN 9FT ADLT (ELECTROSURGICAL) ×1 IMPLANT
EVACUATOR 1/8 PVC DRAIN (DRAIN) ×2 IMPLANT
GAUZE PACKING IODOFORM 1/2 (PACKING) ×1 IMPLANT
GAUZE XEROFORM 5X9 LF (GAUZE/BANDAGES/DRESSINGS) ×1 IMPLANT
GLOVE ORTHO TXT STRL SZ7.5 (GLOVE) ×2 IMPLANT
GOWN PREVENTION PLUS LG XLONG (DISPOSABLE) ×2 IMPLANT
HANDPIECE INTERPULSE COAX TIP (DISPOSABLE) ×2
KIT BASIN OR (CUSTOM PROCEDURE TRAY) ×2 IMPLANT
PACK LOWER EXTREMITY WL (CUSTOM PROCEDURE TRAY) ×2 IMPLANT
PAD CAST 4YDX4 CTTN HI CHSV (CAST SUPPLIES) ×1 IMPLANT
PADDING CAST COTTON 4X4 STRL (CAST SUPPLIES) ×2
POSITIONER SURGICAL ARM (MISCELLANEOUS) ×2 IMPLANT
SET HNDPC FAN SPRY TIP SCT (DISPOSABLE) ×1 IMPLANT
SPONGE GAUZE 4X4 12PLY (GAUZE/BANDAGES/DRESSINGS) ×2 IMPLANT
SYR CONTROL 10ML LL (SYRINGE) ×2 IMPLANT
TOWEL OR 17X26 10 PK STRL BLUE (TOWEL DISPOSABLE) ×2 IMPLANT

## 2012-09-06 NOTE — Anesthesia Preprocedure Evaluation (Addendum)
Anesthesia Evaluation  Patient identified by MRN, date of birth, ID band Patient awake    Reviewed: Allergy & Precautions, H&P , NPO status , Patient's Chart, lab work & pertinent test results  Airway Mallampati: II TM Distance: >3 FB Neck ROM: full    Dental no notable dental hx. (+) Teeth Intact and Dental Advisory Given   Pulmonary neg pulmonary ROS,  Hx. PE breath sounds clear to auscultation  Pulmonary exam normal       Cardiovascular Exercise Tolerance: Good negative cardio ROS  Rhythm:regular Rate:Normal     Neuro/Psych negative neurological ROS  negative psych ROS   GI/Hepatic negative GI ROS, Neg liver ROS,   Endo/Other  negative endocrine ROSdiabetes, Well Controlled, Type 2, Insulin DependentMorbid obesity  Renal/GU negative Renal ROS  negative genitourinary   Musculoskeletal   Abdominal (+) + obese,   Peds  Hematology negative hematology ROS (+) Blood dyscrasia, , Hgb 10.8   Anesthesia Other Findings Na 128  Reproductive/Obstetrics negative OB ROS                        Anesthesia Physical Anesthesia Plan  ASA: III  Anesthesia Plan: General   Post-op Pain Management:    Induction: Intravenous  Airway Management Planned: LMA  Additional Equipment:   Intra-op Plan:   Post-operative Plan:   Informed Consent: I have reviewed the patients History and Physical, chart, labs and discussed the procedure including the risks, benefits and alternatives for the proposed anesthesia with the patient or authorized representative who has indicated his/her understanding and acceptance.   Dental Advisory Given  Plan Discussed with: CRNA and Surgeon  Anesthesia Plan Comments:         Anesthesia Quick Evaluation

## 2012-09-06 NOTE — Transfer of Care (Signed)
Immediate Anesthesia Transfer of Care Note  Patient: Karen Bennett  Procedure(s) Performed: Procedure(s): IRRIGATION AND DEBRIDEMENT EXTREMITY (Left)  Patient Location: PACU  Anesthesia Type:General  Level of Consciousness: sedated  Airway & Oxygen Therapy: Patient Spontanous Breathing and Patient connected to face mask oxygen  Post-op Assessment: Report given to PACU RN and Post -op Vital signs reviewed and stable  Post vital signs: Reviewed and stable  Complications: No apparent anesthesia complications

## 2012-09-06 NOTE — Progress Notes (Signed)
[facility] PROGRESS NOTE  [place] [address] AVW:[id number] DOB: 09/21/1975 DOA: 09/02/2012  PCP: No PCP  Brief HPI: 37 year-old female with history of diabetes mellitus type 2 not on medication secondary to financial issues presented with complaint of worsening swelling pain and discharge from left foot. 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 In the ER patient had x-ray of the left foot which shows possibility of osteomyelitis and on clinical exam patient has significant swelling concerning for cellulitis. Patient's blood sugar is also uncontrolled.  Past medical history:  Past Medical History  Diagnosis Date  . Cellulitis   . Diabetes mellitus   . PE (pulmonary embolism) ~2007-2008    Not on anticoagulation    Consultants: Ortho (Dr. Ophelia Charter)  Procedures: None yet  Antibiotics: IV Vanc 2/10--> IV Zosyn 2/10-->  Subjective: Patient informed me that she was going to be taken to the OR for debridement today. Continues to have 10/10 pain in left foot. PICC line placed right forearm.   Objective: Vital Signs  Filed Vitals:   09/05/12 0535 09/05/12 1358 09/05/12 2200 09/06/12 0600  BP: 136/67 132/56 132/51 117/67  Pulse: 81 73 77 82  Temp: 98.5 F (36.9 C) 99.9 F (37.7 C) 98.6 F (37 C) 97.8 F (36.6 C)  TempSrc: Oral Oral Oral Oral  Resp: 20 18 18 18   Height:      Weight:      SpO2: 92% 94% 97% 93%    Intake/Output Summary (Last 24 hours) at 09/06/12 1120 Last data filed at 09/06/12 0900  Gross per 24 hour  Intake   5330 ml  Output      0 ml  Net   5330 ml   Filed Weights   09/03/12 0356  Weight: 177 kg (390 lb 3.4 oz)    General appearance: alert, cooperative, appears stated age and morbidly obese Resp: clear to auscultation bilaterally Cardio: regular rate and rhythm, S1, S2 normal. Systolic murmur in aortic area. No click, rub or gallop GI: soft, non-tender; bowel sounds normal; no  masses,  no organomegaly Extremities: left foot covered with dressing. Left arm is swollen with good pulses. No erythema. Pulses: 2+ and symmetric Neurologic: Alert and oriented x 3. No focal deficits.  Lab Results:  Basic Metabolic Panel:  Recent Labs Lab 09/02/12 2220 09/03/12 0505 09/04/12 0512 09/05/12 0520 09/06/12 0450  NA 126* 127* 125* 126* 128*  K 3.8 3.5 3.8 3.6 3.3*  CL 90* 93* 92* 94* 92*  CO2 23 24 23 25 24   GLUCOSE 339* 290* 330* 315* 234*  BUN 9 9 8 8 7   CREATININE 0.47* 0.68 0.63 0.62 0.58  CALCIUM 8.9 8.7 8.5 8.8 8.7   CBC:  Recent Labs Lab 09/02/12 2220 09/03/12 0505 09/04/12 0512 09/05/12 0520 09/06/12 0450  WBC 10.4 10.9* 11.2* 9.1 7.5  NEUTROABS 7.6  --   --   --   --   HGB 13.6 12.8 11.8* 11.0* 10.8*  HCT 37.8 36.4 34.1* 32.7* 32.1*  MCV 83.3 84.3 84.6 85.4 85.8  PLT 230 221 221 249 258   CBG:  Recent Labs Lab 09/05/12 1210 09/05/12 1646 09/05/12 2002 09/05/12 2356 09/06/12 0736  GLUCAP 207* 306* 249* 246* 237*    Recent Results (from the past 240 hour(s))  CULTURE, BLOOD (ROUTINE X 2)     Status: None   Collection Time    09/02/12 10:20 PM      Result Value  Range Status   Specimen Description BLOOD RIGHT WRIST   Final   Special Requests BOTTLES DRAWN AEROBIC AND ANAEROBIC   Final   Culture  Setup Time 09/03/2012 03:48   Final   Culture     Final   Value:        BLOOD CULTURE RECEIVED NO GROWTH TO DATE CULTURE WILL BE HELD FOR 5 DAYS BEFORE ISSUING A FINAL NEGATIVE REPORT   Report Status PENDING   Incomplete  CULTURE, BLOOD (ROUTINE X 2)     Status: None   Collection Time    09/02/12 10:30 PM      Result Value Range Status   Specimen Description BLOOD LEFT ARM   Final   Special Requests BOTTLES DRAWN AEROBIC AND ANAEROBIC 5CC   Final   Culture  Setup Time 09/03/2012 03:48   Final   Culture     Final   Value: STAPHYLOCOCCUS AUREUS     Note: RIFAMPIN AND GENTAMICIN SHOULD NOT BE USED AS SINGLE DRUGS FOR TREATMENT OF  STAPH INFECTIONS.     Note: Gram Stain Report Called to,Read Back By and Verified With: CINDY SHELTON @0418  ON 09/04/12 BY MCLET   Report Status 09/06/2012 FINAL   Final   Organism ID, Bacteria STAPHYLOCOCCUS AUREUS   Final  SURGICAL PCR SCREEN     Status: Abnormal   Collection Time    09/03/12  3:58 AM      Result Value Range Status   MRSA, PCR NEGATIVE  NEGATIVE Final   Staphylococcus aureus POSITIVE (*) NEGATIVE Final   Comment:            The Xpert SA Assay (FDA     approved for NASAL specimens     in patients over 12 years of age),     is one component of     a comprehensive surveillance     program.  Test performance has     been validated by [employer] for patients greater     than or equal to 74 year old.     It is not intended     to diagnose infection nor to     guide or monitor treatment.      Studies/Results: No results found.  Medications:  Scheduled: . docusate sodium  100 mg Oral BID  . insulin aspart  0-15 Units Subcutaneous TID WC  . insulin aspart protamine-insulin aspart  25 Units Subcutaneous BID WC  . piperacillin-tazobactam (ZOSYN)  IV  3.375 g Intravenous Q8H  . vancomycin  1,250 mg Intravenous Q8H   Continuous: . sodium chloride 100 mL/hr (09/04/12 1151)   ZOX:[id number], acetaminophen, HYDROmorphone (DILAUDID) injection, oxyCODONE, promethazine  Assessment/Plan:  Principal Problem:   Cellulitis Active Problems:   DM (diabetes mellitus), type 2, uncontrolled with complications   Hyponatremia   Osteomyelitis   Bacteremia    Left foot cellulitis with focal abscess.  No osteomyelitis noted on CT. Plan is for debridement today. Continue antibiotics. MSSA in 1/2 bottles. Pain control. Doppler negative for DVT.   MSSA Bacteremia In one of two bottles. ID to see patient. Will order ECHO for murmur.   Uncontrolled diabetes mellitus type 2 secondary to noncompliance  Hemoglobin A1c 16.8. Started Insulin 70/30 as she will not be able  to afford any other insulin. Continue SSI. Increase dose of 70/30 further today.   Hyponatremia and Hypokalemia Slight improvement today. Probably from uncontrolled diabetes and dehydration. Continue IVF. Continue to monitor. Replete K.  DVT Prophylaxis SCD's  Lack of financial resources and primary care will make managing her chronic medical problems challenging.  Code Status: full Code Family Communication: No family at bedside  Disposition Plan: Unclear for now.    LOS: 4 days   [facility]  [facility] Pager [phone] 09/06/2012, 11:20 AM  If 8PM-8AM, please contact night-coverage at [email or web address], password [facility]

## 2012-09-06 NOTE — Interval H&P Note (Signed)
History and Physical Interval Note:  09/06/2012 5:22 PM  [facility] [place]  has presented today for surgery, with the diagnosis of diabetic infection  The various methods of treatment have been discussed with the patient and family. After consideration of risks, benefits and other options for treatment, the patient has consented to  Procedure(s): IRRIGATION AND DEBRIDEMENT EXTREMITY (Left) as a surgical intervention .  The patient's history has been reviewed, patient examined, no change in status, stable for surgery.  I have reviewed the patient's chart and labs.  Questions were answered to the patient's satisfaction.     YATES,MARK C

## 2012-09-06 NOTE — Progress Notes (Signed)
Met with pt at bedside to discuss assistance with medications. Pt stated she is not working at this time and has no income. She stated she has applied for Medicaid and disability several times and has been denied. She stated she wanted the doctors, case manager and social workers to assist her by calling the social services office and recommending that she qualifies for disability. I informed pt that calling social services to recommend disability for her is not something I can do. I informed her that she does qualify for [facility] program which is once every 12 months with a $3.00 per prescription, with 34 days worth of medications. Pt stated she does not have $3.00. I asked her to see if her friend can assist her. Pt stated that her only friend that brought her to the hospital cannot assist her because her brakes are going bad.  I asked pt about follow up care and whether she had looked at transportation resources provided to her during her last admission. [place] all the resources provided to her for transportation are for people with disability or have medicaid.

## 2012-09-06 NOTE — H&P (View-Only) (Signed)
Patient ID: Karen Bennett, female   DOB: 07-30-1975, 37 y.o.   MRN: [id number] CT without contrast did not show abscess. The CT with contrast did show abscess down by MT head . Will drain in OR today.   Post op should be able to go home on PO ABX in a day or so when pain controlled. Will get additional cultures.

## 2012-09-06 NOTE — Progress Notes (Signed)
cbg 246 but pt ate dinner at 2100 due to being in surgery earlier in evening.   Karen Bennett P

## 2012-09-06 NOTE — Progress Notes (Signed)
ANTIBIOTIC CONSULT NOTE - FOLLOW UP  Pharmacy Consult for Vancomycin, Zosyn Indication: cellulitis, r/o osteo  Allergies  Allergen Reactions  . Zofran Itching and Nausea And Vomiting  . Doxycycline Nausea And Vomiting  . Morphine And Related Itching   Patient Measurements: Height: 5\' 8"  (172.7 cm) Weight: 390 lb 3.4 oz (177 kg) IBW/kg (Calculated) : 63.9  Vital Signs: Temp: 97.8 F (36.6 C) (02/14 0600) Temp src: Oral (02/14 0600) BP: 117/67 mmHg (02/14 0600) Pulse Rate: 82 (02/14 0600) Intake/Output from previous day: 02/13 0701 - 02/14 0700 In: 5210 [P.O.:840; I.V.:2720; IV Piggyback:1650] Out: -  Intake/Output from this shift:    Labs:  Recent Labs  09/04/12 0512 09/05/12 0520 09/06/12 0450  WBC 11.2* 9.1 7.5  HGB 11.8* 11.0* 10.8*  PLT 221 249 258  CREATININE 0.63 0.62 0.58   Estimated Creatinine Clearance: 169 ml/min (by C-G formula based on Cr of 0.58).  Recent Labs  09/05/12 1719  VANCOTROUGH 7.0*   Assessment:  36 yof with long-standing chronic diabetic ulceration of L foot, s/p fourth and fifth toe amputations in 2013 and underwent I&D 10/2011 presented 09/02/12 with L foot cellulitis. X-ray of L foot shows possibility of osteo. MRI cannot be completed to r/o osteo given patient's weight.  2/10 >> vanc >> 2/10 >> zosyn >>  Tmax: afebrile WBCs: 7.8 Renal: Scr=0.58, N/CG > 100 ml/min  2/10 blood x 2 >> MSSA  Dose changes/drug level info: 2/11: Incr vanc from 1g q12h to 1500 q12h empirically for weight/indication. 2/13: VT 7.1 on 1500mg  q12h. Changed to 1250mg  q8h. Recheck VT soon.  Goal of Therapy:  Vancomycin trough level 15-20 mcg/ml  Plan:   Vancomcyin changed to 1250mg  IV q8h last night but patient lost IV access, awaiting PICC.   Check Vancomycin trough at new Css if vancomycin to continue (based on [facility] = MSSA)  ID to see patient per new policy for staph bacteremia  Await recommendations for possible antibiotic adjustments and f/u  blood cx, r/o IE, etc.   Continue Zosyn 3.375g IV Q8H infused over 4hrs.  Juliette Alcide, PharmD, BCPS.   Pager: [phone]  09/06/2012,8:57 AM.

## 2012-09-06 NOTE — Anesthesia Postprocedure Evaluation (Signed)
  Anesthesia Post-op Note  Patient: Karen Bennett  Procedure(s) Performed: Procedure(s) (LRB): IRRIGATION AND DEBRIDEMENT EXTREMITY (Left)  Patient Location: PACU  Anesthesia Type: General  Level of Consciousness: awake and alert   Airway and Oxygen Therapy: Patient Spontanous Breathing  Post-op Pain: mild  Post-op Assessment: Post-op Vital signs reviewed, Patient's Cardiovascular Status Stable, Respiratory Function Stable, Patent Airway and No signs of Nausea or vomiting  Last Vitals:  Filed Vitals:   09/06/12 2003  BP: 125/68  Pulse: 78  Temp: 36.6 C  Resp: 20    Post-op Vital Signs: stable   Complications: No apparent anesthesia complications

## 2012-09-06 NOTE — Progress Notes (Signed)
piccPeripherally Inserted Central Catheter/Midline Placement  The IV Nurse has discussed with the patient and/or persons authorized to consent for the patient, the purpose of this procedure and the potential benefits and risks involved with this procedure.  The benefits include less needle sticks, lab draws from the catheter and patient may be discharged home with the catheter.  Risks include, but not limited to, infection, bleeding, blood clot (thrombus formation), and puncture of an artery; nerve damage and irregular heat beat.  Alternatives to this procedure were also discussed.  PICC/Midline Placement Documentation  PICC / Midline Single Lumen 09/06/12 PICC Right Cephalic (Active)       Ethelda Chick 09/06/2012, 11:06 AM

## 2012-09-06 NOTE — Progress Notes (Signed)
*   Formal ID consultation to follow for staph aureus bacteremia  * FYI:  patient should NOT go home on oral antibiotics, due to the presence staph aureus bacteremia      [place] [facility]icrobial Management Team Staphylococcus aureus bacteremia   Staphylococcus aureus bacteremia (SAB) is associated with a high rate of complications and mortality.  Specific aspects of clinical management are critical to optimizing the outcome of patients with SAB.  Therefore, the [facility] Antimicrobial Management Team [facility]) has initiated an intervention aimed at improving the management of SAB at [facility].  To do so, Infectious Diseases physicians are providing an evidence-based consult for the management of all patients with SAB.     Yes No Comments  Perform follow-up blood cultures (even if the patient is afebrile) to ensure clearance of bacteremia [x]  []  Ordered on 2/14       Perform echocardiography to evaluate for endocarditis (transthoracic ECHO is 40-50% sensitive, TEE is > 90% sensitive) (please order TTE) []  [x]  Please keep in mind, that neither test can definitively EXCLUDE endocarditis, and that should clinical suspicion remain high for endocarditis the patient should then still be treated with an "endocarditis" duration of therapy = 6 weeks       Ensure source control Abscess drainage to occur in OR []  [x]  Have all abscesses been drained effectively? Have deep seeded infections (septic joints or osteomyelitis) had appropriate surgical debridement?  Investigate for "metastatic" sites of infection [x]  []  Does the patient have ANY symptom or physical exam finding that would suggest a deeper infection (back or neck pain that may be suggestive of vertebral osteomyelitis or epidural abscess, muscle pain that could be a symptom of pyomyositis)?  Keep in mind that for deep seeded infections MRI imaging with contrast is preferred rather than other often insensitive tests such as plain x-rays, especially  early in a patient's presentation.  Change antibiotic therapy to _____cefazolin 2gm IV Q8hr__ [x]  []  Beta-lactam antibiotics are preferred for MSSA due to higher cure rates.   If on Vancomycin, goal trough should be 15 - 20 mcg/mL  Estimated duration of IV antibiotic therapy:   At least 4 wks [x]  []  Consult case management for probably prolonged outpatient IV antibiotic therapy

## 2012-09-06 NOTE — Addendum Note (Signed)
Addendum created 09/06/12 2027 by Riyana Biel L Adrion Menz, MD   Modules edited: Anesthesia Responsible Staff    

## 2012-09-06 NOTE — Brief Op Note (Signed)
09/02/2012 - 09/06/2012  6:04 PM  PATIENT:  Karen Bennett  37 y.o. female  PRE-OPERATIVE DIAGNOSIS:  diabetic infection  POST-OPERATIVE DIAGNOSIS:  diabetic infection  PROCEDURE:  Procedure(s): IRRIGATION AND DEBRIDEMENT EXTREMITY (Left)  SURGEON:  Surgeon(s) and Role:    * Eldred Manges, MD - Primary  PHYSICIAN ASSISTANT:   ASSISTANTS:   ANESTHESIA:   general  EBL:  Total I/O In: 2460 [P.O.:480; I.V.:1980] Out: -   BLOOD ADMINISTERED:none  DRAINS: none   LOCAL MEDICATIONS USED:  NONE  SPECIMEN:  No Specimen  DISPOSITION OF SPECIMEN:  N/A  COUNTS:  YES  TOURNIQUET:    DICTATION: .Other Dictation: Dictation Number [id number]  PLAN OF CARE: still inpatient  PATIENT DISPOSITION:  PACU - hemodynamically stable.   Delay start of Pharmacological VTE agent (>24hrs) due to surgical blood loss or risk of bleeding: not applicable

## 2012-09-06 NOTE — Progress Notes (Signed)
Echocardiogram 2D Echocardiogram has been performed.  Karen Bennett 09/06/2012, 4:20 PM

## 2012-09-06 NOTE — Consult Note (Signed)
[facility]  Total days of antibiotics 5        Day 1 cefazolin        ( 4 days of piptazo)        ( 5 days of vanco)       Reason for Consult: MSSA bacteremia and diabetic foot wound    Referring Physician:  Krishnan/automatic SAB consultation  Principal Problem:   Cellulitis Active Problems:   DM (diabetes mellitus), type 2, uncontrolled with complications   Hyponatremia   Osteomyelitis   Bacteremia   MSSA (methicillin susceptible Staphylococcus aureus)    HPI: Karen Bennett is a 37 y.o. 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 On admit, she was found to have leukocytosis of 11 but afebrile. Physical exam suggested cellulitis/ diabetic ulcer measuring 2 x 4 cm. She underwent imaging of foot which revealed a focal abscess of 1.4 x 2.2. X 1.4 cm adjacent to remnant of 5th MT. As well as phlegmon. And small amount of peripheral enhancing fluid along plantar aspect of the lisfranc joint. No evidence of progressive osteo by CT imaging. She was empirically started on vancomycin and piptazo as well as blood cultures drain. She was found to have 1 of 2 sets of blood cultures + MSSA (oxa S) on day 2 of growth. She has been evaluted by wound care, orthopedics, and diabetes coordinator.  Past Medical History  Diagnosis Date  . Cellulitis   . Diabetes mellitus   . PE (pulmonary embolism) ~2007-2008    Not on anticoagulation    Allergies:  Allergies  Allergen Reactions  . Zofran Itching and Nausea And Vomiting  . Doxycycline Nausea And Vomiting  . Morphine And Related Itching    MEDICATIONS: .  ceFAZolin (ANCEF) IV  2 g Intravenous Q8H  . docusate sodium  100 mg Oral BID  . insulin aspart  0-15 Units Subcutaneous TID WC  . insulin aspart protamine-insulin aspart  30 Units Subcutaneous BID  WC  . potassium chloride  10 mEq Intravenous Q1 Hr x 4  . sodium chloride  10-40 mL Intracatheter Q12H  . vancomycin  1,250 mg Intravenous Q8H    History  Substance Use Topics  . Smoking status: Never Smoker   . Smokeless tobacco: Never Used  . Alcohol Use: No    Family History  Problem Relation Age of Onset  . Diabetes Mother   . Coronary artery disease Mother      Review of Systems  Constitutional: Negative for fever, chills, diaphoresis, activity change, appetite change, fatigue and unexpected weight change.  HENT: Negative for congestion, sore throat, rhinorrhea, sneezing, trouble swallowing and sinus pressure.  Eyes: Negative for photophobia and visual disturbance.  Respiratory: Negative for cough, chest tightness, shortness of breath, wheezing and stridor.  Cardiovascular: Negative for chest pain, palpitations and leg swelling.  Gastrointestinal: Negative for nausea, vomiting, abdominal pain, diarrhea, constipation, blood in stool, abdominal distention and anal bleeding.  Genitourinary: Negative for dysuria, hematuria, flank pain and difficulty urinating.  Musculoskeletal: Negative for myalgias, back pain, joint swelling, arthralgias and gait problem.  Skin: redness and drainage to left foot Neurological: Negative for dizziness, tremors, weakness and light-headedness.  Hematological: Negative for adenopathy. Does not bruise/bleed easily.      OBJECTIVE: Temp:  [97.8 F (36.6 C)-98.6  F (37 C)] 98.4 F (36.9 C) (02/14 1325) Pulse Rate:  [77-84] 84 (02/14 1325) Resp:  [18] 18 (02/14 1325) BP: (117-141)/(51-84) 141/84 mmHg (02/14 1325) SpO2:  [93 %-97 %] 96 % (02/14 1325)  Constitutional: She is oriented to person, place, and time. She appears well-developed and well-nourished. No distress.  HENT:  Normocephalic and atraumatic.  Eyes: Conjunctivae are normal. Pupils are equal, round, and reactive to light. . No scleral icterus.  Neck: Normal range of motion. Neck  supple.  Cardiovascular: Normal rate and regular rhythm.  Respiratory: Effort normal and breath sounds normal. No respiratory distress. She has no wheezes. She has no rales.  GI: Soft. Bowel sounds are normal. She exhibits no distension. There is no tenderness. There is no rebound.  Ext: Left foot has a 2 x4 cm ulcer on the plantar aspect with drainage with 4th and 5th toe amputated Skin: Skin is warm and dry. She is not diaphoretic.   LABS: Results for orders placed during the hospital encounter of 09/02/12 (from the past 48 hour(s))  GLUCOSE, CAPILLARY     Status: Abnormal   Collection Time    09/04/12  5:09 PM      Result Value Range   Glucose-Capillary 298 (*) 70 - 99 mg/dL   Comment 1 Notify RN    GLUCOSE, CAPILLARY     Status: Abnormal   Collection Time    09/04/12  8:19 PM      Result Value Range   Glucose-Capillary 334 (*) 70 - 99 mg/dL  GLUCOSE, CAPILLARY     Status: Abnormal   Collection Time    09/05/12 12:13 AM      Result Value Range   Glucose-Capillary 297 (*) 70 - 99 mg/dL  GLUCOSE, CAPILLARY     Status: Abnormal   Collection Time    09/05/12  4:01 AM      Result Value Range   Glucose-Capillary 304 (*) 70 - 99 mg/dL  BASIC METABOLIC PANEL     Status: Abnormal   Collection Time    09/05/12  5:20 AM      Result Value Range   Sodium 126 (*) 135 - 145 mEq/L   Potassium 3.6  3.5 - 5.1 mEq/L   Chloride 94 (*) 96 - 112 mEq/L   CO2 25  19 - 32 mEq/L   Glucose, Bld 315 (*) 70 - 99 mg/dL   BUN 8  6 - 23 mg/dL   Creatinine, Ser 1.61  0.50 - 1.10 mg/dL   Calcium 8.8  8.4 - 09.6 mg/dL   GFR calc non Af Amer >90  >90 mL/min   GFR calc Af Amer >90  >90 mL/min   Comment:            The eGFR has been calculated     using the CKD EPI equation.     This calculation has not been     validated in all clinical     situations.     eGFR's persistently     <90 mL/min signify     possible Chronic Kidney Disease.  CBC     Status: Abnormal   Collection Time    09/05/12  5:20  AM      Result Value Range   WBC 9.1  4.0 - 10.5 K/uL   RBC 3.83 (*) 3.87 - 5.11 MIL/uL   Hemoglobin 11.0 (*) 12.0 - 15.0 g/dL   HCT 04.5 (*) 40.9 - 81.1 %   MCV 85.4  78.0 - 100.0 fL   MCH 28.7  26.0 - 34.0 pg   MCHC 33.6  30.0 - 36.0 g/dL   RDW 16.1  09.6 - 04.5 %   Platelets 249  150 - 400 K/uL  GLUCOSE, CAPILLARY     Status: Abnormal   Collection Time    09/05/12  7:54 AM      Result Value Range   Glucose-Capillary 287 (*) 70 - 99 mg/dL   Comment 1 Notify RN    GLUCOSE, CAPILLARY     Status: Abnormal   Collection Time    09/05/12 12:10 PM      Result Value Range   Glucose-Capillary 207 (*) 70 - 99 mg/dL   Comment 1 Notify RN    GLUCOSE, CAPILLARY     Status: Abnormal   Collection Time    09/05/12  4:46 PM      Result Value Range   Glucose-Capillary 306 (*) 70 - 99 mg/dL  VANCOMYCIN, TROUGH     Status: Abnormal   Collection Time    09/05/12  5:19 PM      Result Value Range   Vancomycin Tr 7.0 (*) 10.0 - 20.0 ug/mL  GLUCOSE, CAPILLARY     Status: Abnormal   Collection Time    09/05/12  8:02 PM      Result Value Range   Glucose-Capillary 249 (*) 70 - 99 mg/dL  GLUCOSE, CAPILLARY     Status: Abnormal   Collection Time    09/05/12 11:56 PM      Result Value Range   Glucose-Capillary 246 (*) 70 - 99 mg/dL   Comment 1 Notify RN    CBC     Status: Abnormal   Collection Time    09/06/12  4:50 AM      Result Value Range   WBC 7.5  4.0 - 10.5 K/uL   RBC 3.74 (*) 3.87 - 5.11 MIL/uL   Hemoglobin 10.8 (*) 12.0 - 15.0 g/dL   HCT 40.9 (*) 81.1 - 91.4 %   MCV 85.8  78.0 - 100.0 fL   MCH 28.9  26.0 - 34.0 pg   MCHC 33.6  30.0 - 36.0 g/dL   RDW 78.2  95.6 - 21.3 %   Platelets 258  150 - 400 K/uL  BASIC METABOLIC PANEL     Status: Abnormal   Collection Time    09/06/12  4:50 AM      Result Value Range   Sodium 128 (*) 135 - 145 mEq/L   Potassium 3.3 (*) 3.5 - 5.1 mEq/L   Chloride 92 (*) 96 - 112 mEq/L   CO2 24  19 - 32 mEq/L   Glucose, Bld 234 (*) 70 - 99 mg/dL   BUN  7  6 - 23 mg/dL   Creatinine, Ser 0.86  0.50 - 1.10 mg/dL   Calcium 8.7  8.4 - 57.8 mg/dL   GFR calc non Af Amer >90  >90 mL/min   GFR calc Af Amer >90  >90 mL/min   Comment:            The eGFR has been calculated     using the CKD EPI equation.     This calculation has not been     validated in all clinical     situations.     eGFR's persistently     <90 mL/min signify     possible Chronic Kidney Disease.  GLUCOSE, CAPILLARY     Status: Abnormal  Collection Time    09/06/12  7:36 AM      Result Value Range   Glucose-Capillary 237 (*) 70 - 99 mg/dL   Comment 1 Notify RN    GLUCOSE, CAPILLARY     Status: Abnormal   Collection Time    09/06/12 11:41 AM      Result Value Range   Glucose-Capillary 180 (*) 70 - 99 mg/dL   Comment 1 Notify RN      MICRO: 2/10 blood cx 1 of 2 sets + MSSA  IMAGING: Dg Chest Port 1 View  09/06/2012  *RADIOLOGY REPORT*  Clinical Data: Evaluation of line placement.  PORTABLE CHEST - 1 VIEW  Comparison: 09/30/2008.  Findings: Tip of right sided venous catheter terminates in the right lower portion of superior vena cava at cavoatrial junction. No pneumothorax is evident.  No pulmonary infiltrates are evident. No pleural abnormality is evident.  Cardiac silhouette is borderline size accentuated by the AP magnification.  IMPRESSION: Tip of right sided venous catheter terminates in the right lower portion of superior vena cava at cavoatrial junction.  No pneumothorax is evident.   Original Report Authenticated By: Onalee Hua Call     Assessment/Plan:  37yo F with poorly controlled diabetes presents with cellulitis/worsening of diabetic ulcer of partially amputated left foot found to have deep tissue abscess in addition to MSSA bacteremia, likely secondary to foot infection.  1) recommend to narrow antibiotics to cefazolin 2gm IV Q8hrs. No hx of MRSA, no hx of PsA. She will need at least 4 wks of IV therapy. May need to extend longer if evidence of  endocarditis  2) we will repeat blood cultures today to ensure that her bacteremia has cleared. Please do not place picc line for antibiotics until 2/16 when we have NGTD noted at 48hrs on current blood cultures  3) please get TTE to see if any evidence of valvular vegetations. If appears to have thickened valves may consider getting TEE  4) agree with source control : the I x D of foot abscess. Please have Dr. Ophelia Charter send more tissue cultures from the OR  5) will arrange for ID follow up at [facility] in 2 wks as well as 4 wks to see how she is tolerating antibiotics  Cliffton Asters available for questions over weekend. I will return on Monday to provide further recs.  Duke Salvia Drue Second MD MPH [facility] [phone]

## 2012-09-06 NOTE — Progress Notes (Signed)
PACU nursing note: Pt made aware of problem with PICC  Line.  Site assessed and redressed in PACU. Ice applied pt not keeping ice in place and refusing to take pain meds. She was anxious to get back to her room. Nursing care explained to pt but she insisted on going to room. Comfort measures provided and pt transferred to floor .She was on the phone trying to order a meal before vital signs could be taken.

## 2012-09-06 NOTE — Progress Notes (Signed)
Patient ID: Karen Bennett, female   DOB: 04/03/1976, 36 y.o.   MRN: [id number] CT without contrast did not show abscess. The CT with contrast did show abscess down by MT head . Will drain in OR today.   Post op should be able to go home on PO ABX in a day or so when pain controlled. Will get additional cultures.  

## 2012-09-07 DIAGNOSIS — D649 Anemia, unspecified: Secondary | ICD-10-CM

## 2012-09-07 LAB — CBC
HCT: 30.7 % — ABNORMAL LOW (ref 36.0–46.0)
Hemoglobin: 10.6 g/dL — ABNORMAL LOW (ref 12.0–15.0)
MCH: 29.2 pg (ref 26.0–34.0)
MCHC: 34.5 g/dL (ref 30.0–36.0)
MCV: 84.6 fL (ref 78.0–100.0)

## 2012-09-07 LAB — BASIC METABOLIC PANEL
BUN: 7 mg/dL (ref 6–23)
GFR calc non Af Amer: >90 mL/min (ref >90)
Glucose, Bld: 285 mg/dL — ABNORMAL HIGH (ref 70–99)
Potassium: 4 mEq/L (ref 3.5–5.1)

## 2012-09-07 LAB — GLUCOSE, CAPILLARY
Glucose-Capillary: 242 mg/dL — ABNORMAL HIGH (ref 70–99)
Glucose-Capillary: 164 mg/dL — ABNORMAL HIGH (ref 70–99)

## 2012-09-07 MED ORDER — HEPARIN SODIUM (PORCINE) 5000 UNIT/ML IJ SOLN
5000.0000 [IU] | Freq: Three times a day (TID) | INTRAMUSCULAR | Status: DC
  Administered 2012-09-07 – 2012-09-09 (×7): 5000 [IU] via SUBCUTANEOUS
  Filled 2012-09-07 (×9): qty 1

## 2012-09-07 NOTE — Progress Notes (Signed)
Patient ID: Karen Bennett, female   DOB: Mar 30, 1976, 37 y.o.   MRN: [id number]         [facility]    Date of Admission:  09/02/2012   Total days of antibiotics 6        Day 2 cefazolin         Principal Problem:   Cellulitis Active Problems:   DM (diabetes mellitus), type 2, uncontrolled with complications   Hyponatremia   Osteomyelitis   Bacteremia   MSSA (methicillin susceptible Staphylococcus aureus)   .  ceFAZolin (ANCEF) IV  2 g Intravenous Q8H  . docusate sodium  100 mg Oral BID  . insulin aspart  0-15 Units Subcutaneous TID WC  . insulin aspart protamine-insulin aspart  30 Units Subcutaneous BID WC  . sodium chloride  10-40 mL Intracatheter Q12H   Objective: Temp:  [97.9 F (36.6 C)-98.8 F (37.1 C)] 98.8 F (37.1 C) (02/15 0634) Pulse Rate:  [73-90] 73 (02/15 0634) Resp:  [12-21] 18 (02/15 0634) BP: (110-145)/(55-97) 129/97 mmHg (02/15 0634) SpO2:  [91 %-99 %] 98 % (02/15 0634)  General: Had I&D of left lateral foot abscess today.  Lab Results Lab Results  Component Value Date   WBC 8.1 09/07/2012   HGB 10.6* 09/07/2012   HCT 30.7* 09/07/2012   MCV 84.6 09/07/2012   PLT PLATELET CLUMPS NOTED ON SMEAR, COUNT APPEARS ADEQUATE 09/07/2012    Microbiology: Recent Results (from the past 240 hour(s))  CULTURE, BLOOD (ROUTINE X 2)     Status: None   Collection Time    09/02/12 10:20 PM      Result Value Range Status   Specimen Description BLOOD RIGHT WRIST   Final   Special Requests BOTTLES DRAWN AEROBIC AND ANAEROBIC   Final   Culture  Setup Time 09/03/2012 03:48   Final   Culture     Final   Value:        BLOOD CULTURE RECEIVED NO GROWTH TO DATE CULTURE WILL BE HELD FOR 5 DAYS BEFORE ISSUING A FINAL NEGATIVE REPORT   Report Status PENDING   Incomplete  CULTURE, BLOOD (ROUTINE X 2)     Status: None   Collection Time    09/02/12 10:30 PM      Result Value Range Status   Specimen Description BLOOD LEFT ARM   Final   Special Requests BOTTLES DRAWN AEROBIC AND ANAEROBIC 5CC   Final   Culture  Setup Time 09/03/2012 03:48   Final   Culture     Final   Value: STAPHYLOCOCCUS AUREUS     Note: RIFAMPIN AND GENTAMICIN SHOULD NOT BE USED AS SINGLE DRUGS FOR TREATMENT OF STAPH INFECTIONS.     Note: Gram Stain Report Called to,Read Back By and Verified With: CINDY SHELTON @0418  ON 09/04/12 BY MCLET   Report Status 09/06/2012 FINAL   Final   Organism ID, Bacteria STAPHYLOCOCCUS AUREUS   Final  SURGICAL PCR SCREEN     Status: Abnormal   Collection Time    09/03/12  3:58 AM      Result Value Range Status   MRSA, PCR NEGATIVE  NEGATIVE Final   Staphylococcus aureus POSITIVE (*) NEGATIVE Final   Comment:            The Xpert SA Assay (FDA     approved for NASAL specimens     in patients over 67 years of age),     is one component of  a comprehensive surveillance     program.  Test performance has     been validated by [facility] for patients greater     than or equal to 12 year old.     It is not intended     to diagnose infection nor to     guide or monitor treatment.  WOUND CULTURE     Status: None   Collection Time    09/06/12  5:56 PM      Result Value Range Status   Specimen Description FOOT LEFT   Final   Special Requests NONE   Final   Gram Stain     Final   Value: RARE WBC PRESENT,BOTH PMN AND MONONUCLEAR     NO SQUAMOUS EPITHELIAL CELLS SEEN     NO ORGANISMS SEEN   Culture PENDING   Incomplete   Report Status PENDING   Incomplete  ANAEROBIC CULTURE     Status: None   Collection Time    09/06/12  5:56 PM      Result Value Range Status   Specimen Description FOOT LEFT   Final   Special Requests NONE   Final   Gram Stain     Final   Value: RARE WBC PRESENT,BOTH PMN AND MONONUCLEAR     NO SQUAMOUS EPITHELIAL CELLS SEEN     NO ORGANISMS SEEN   Culture PENDING   Incomplete   Report Status PENDING   Incomplete   Assessment: She has MSSA bacteremia related to a left foot abscess. No  evidence of endocarditis was seen on transthoracic echocardiogram. I will continue IV cefazolin for now. I will discuss the potential utility of TEE with Dr. Drue Second and have her followup on February 17.   Plan: 1. Continue cefazolin 2. Followup repeat blood cultures and wound culture 3. Please call me for any questions tomorrow. Dr. Drue Second will followup on February 17  Cliffton Asters, MD [facility] [phone] pager   [phone] cell 09/07/2012, 11:01 AM

## 2012-09-07 NOTE — Progress Notes (Signed)
Subjective: 1 Day Post-Op Procedure(s) (LRB): IRRIGATION AND DEBRIDEMENT EXTREMITY (Left) Patient reports pain as mild.    Objective: Vital signs in last 24 hours: Temp:  [97.9 F (36.6 C)-98.8 F (37.1 C)] 98.8 F (37.1 C) (02/15 0634) Pulse Rate:  [73-90] 73 (02/15 0634) Resp:  [12-21] 18 (02/15 0634) BP: (110-145)/(55-97) 129/97 mmHg (02/15 0634) SpO2:  [91 %-99 %] 98 % (02/15 0634)  Intake/Output from previous day: 02/14 0701 - 02/15 0700 In: 5550 [P.O.:1320; I.V.:4080; IV Piggyback:150] Out: -  Intake/Output this shift:     Recent Labs  09/05/12 0520 09/06/12 0450 09/07/12 0559  HGB 11.0* 10.8* 10.6*    Recent Labs  09/06/12 0450 09/07/12 0559  WBC 7.5 8.1  RBC 3.74* 3.63*  HCT 32.1* 30.7*  PLT 258 PLATELET CLUMPS NOTED ON SMEAR, COUNT APPEARS ADEQUATE    Recent Labs  09/06/12 0450 09/07/12 0559  NA 128* 129*  K 3.3* 4.0  CL 92* 98  CO2 24 20  BUN 7 7  CREATININE 0.58 0.51  GLUCOSE 234* 285*  CALCIUM 8.7 8.8   No results found for this basename: LABPT, INR,  in the last 72 hours  decrease sensation to both feet .  as before c/w diabetic neuropathy  Assessment/Plan: 1 Day Post-Op Procedure(s) (LRB): IRRIGATION AND DEBRIDEMENT EXTREMITY (Left)   Continue ABX ,    I CHANGED DRESSING AND ADVANCED IODOFORM NUGUAZE STRIP AND REDRESSED. NOTHING ON CULTURES YET FROM INTRA-OP.  HAD MSSA IN BLOOD LIKELY PATHOGEN.  WILL NEED SOCIAL SERVICE HELP TO GET SOME TYPE OF HELP TO SIGN UP  For [employer] insurance/ medicaid.  Has no insurance and no doctor and if something is not done before she leaves she will be returning for readmission for problems when she runs out of insulin.   Kenneth Cuaresma C 09/07/2012, 1:19 PM

## 2012-09-07 NOTE — Op Note (Signed)
Karen Bennett, STREET               ACCOUNT NO.:  [id number]  MEDICAL RECORD NO.:  [id number]  LOCATION:  1508                         FACILITY:  [facility]  PHYSICIAN:  Abyan Cadman C. Ophelia Charter, M.D.    DATE OF BIRTH:  1976-02-17  DATE OF PROCEDURE:  09/06/2012 DATE OF DISCHARGE:                              OPERATIVE REPORT   PREOPERATIVE DIAGNOSIS:  Left foot subcutaneous abscess.  POSTOPERATIVE DIAGNOSIS:  Left foot subcutaneous abscess.  PROCEDURE:  Excision and debridement, and drainage of subcutaneous Abscess.   (Sharp scapel excision of necrotic subcutaneous tissue )  SURGEON:  Maryetta Shafer C. Ophelia Charter, M.D.  ANESTHESIA:  General.  TOURNIQUET:  None.  ESTIMATED BLOOD LOSS:  Minimal.  PACKING:  One-half inch iodoform Nu Gauze drip.  INDICATIONS FOR PROCEDURE:  This 37 year old, 290-pound diabetic last did insulin a year ago, does not have any insurance, was not able to get any insulin she states due to financial problems and lack of insurance. She presented to the hospital with increasing pain and swelling in her foot, and plain radiographs were negative for osteomyelitis.  Due to her large size, she would not fit in the MRI scanner and CT scan was performed which did not show any evidence of osteomyelitis and initial CT scan without contrast did not show an abscess.  CT scan with contrast did show a small subcutaneous abscess and blood cultures grew Staph aureus which was methicillin sensitive.  DESCRIPTION OF PROCEDURE:  After induction of general anesthesia, the leg was scrubbed and prepped with Betadine scrub and solution and a sticky U drape, and stockinette.  Extremity sheets and drapes were applied.  The CT scan was used and displayed in the operating room for localization.  Time-out procedure was completed.  Vancomycin was available and was not given until the cultures were obtained.  Incision was made laterally on the foot consistent with 2 cm along the lateral aspect.  The patient  already had fourth and fifth ray amputation in the past.  Incision was made over the lateral aspect so that she would have the incision directly over the plantar surface.  This was a 1.5 cm distal to the metatarsals fourth and fifth that had been amputated. Tonsil clamp was inserted.  Purulence was obtained and aerobic and anaerobic cultures were obtained.  Suction was used to suction this out and was curetted. Sharp scapel excision of necrotic sub Q fat.   One liter of saline solution was used for irrigation. Palpation and probing showed no dissection proximally.  Infection was in the subcutaneous pocket and the area was squeezed to remove any remaining purulent material, and after suctioning and irrigation, no further purulence was noted.  The calluses around the edge, which the patient probably not had trimmed in a year were debrided sharply with a 10 scalpel blade down the soft tissue. Large piece of Xeroform was applied after the wound was packed with 0.25- inch Nu Gauze with Betadine and strip followed by 4x4s, ABDs, and Coban. The patient tolerated the procedure well.  Vancomycin was started on Friday after cultures were obtained.     [name] C. Ophelia Charter, M.D.     MCY/MEDQ  D:  09/06/2012  T:  09/07/2012  Job:  [id number]

## 2012-09-07 NOTE — Progress Notes (Addendum)
[facility] PROGRESS NOTE  Karen Bennett JXB:[id number] DOB: 11-02-1975 DOA: 09/02/2012  PCP: No PCP  Brief HPI: 37 year-old female with history of diabetes mellitus type 2 not on medication secondary to financial issues presented with complaint of worsening swelling pain and discharge from left foot. 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 In the ER patient had x-ray of the left foot which shows possibility of osteomyelitis and on clinical exam patient has significant swelling concerning for cellulitis. Patient's blood sugar is also uncontrolled.  Past medical history:  Past Medical History  Diagnosis Date  . Cellulitis   . Diabetes mellitus   . PE (pulmonary embolism) ~2007-2008    Not on anticoagulation    Consultants: Ortho (Dr. Ophelia Charter)  Procedures: I&D 2/14 of left foot  Antibiotics: IV Vanc 2/10-->2/14 IV Zosyn 2/10-->2/14 IV Cefazolin 2/14-->  Subjective: Patient feels slightly better. Says the surgery went ok. Denies new complaints.  Objective: Vital Signs  Filed Vitals:   09/06/12 2003 09/06/12 2223 09/06/12 2300 09/07/12 0634  BP: 125/68 133/65 145/75 129/97  Pulse: 78 87 85 73  Temp: 97.9 F (36.6 C) 97.9 F (36.6 C) 98.5 F (36.9 C) 98.8 F (37.1 C)  TempSrc: Oral Oral Oral Oral  Resp: 20 20 18 18   Height:      Weight:      SpO2: 99% 97% 95% 98%    Intake/Output Summary (Last 24 hours) at 09/07/12 1243 Last data filed at 09/07/12 0700  Gross per 24 hour  Intake   5070 ml  Output      0 ml  Net   5070 ml   Filed Weights   09/03/12 0356  Weight: 177 kg (390 lb 3.4 oz)    General appearance: alert, cooperative, appears stated age and morbidly obese Resp: clear to auscultation bilaterally Cardio: regular rate and rhythm, S1, S2 normal. Systolic murmur in aortic area. No click, rub or gallop GI: soft, non-tender; bowel sounds normal; no masses,  no organomegaly Extremities:  left foot covered with dressing. Pulses: 2+ and symmetric Neurologic: Alert and oriented x 3. No focal deficits.  Lab Results:  Basic Metabolic Panel:  Recent Labs Lab 09/03/12 0505 09/04/12 0512 09/05/12 0520 09/06/12 0450 09/07/12 0559  NA 127* 125* 126* 128* 129*  K 3.5 3.8 3.6 3.3* 4.0  CL 93* 92* 94* 92* 98  CO2 24 23 25 24 20   GLUCOSE 290* 330* 315* 234* 285*  BUN 9 8 8 7 7   CREATININE 0.68 0.63 0.62 0.58 0.51  CALCIUM 8.7 8.5 8.8 8.7 8.8   CBC:  Recent Labs Lab 09/02/12 2220 09/03/12 0505 09/04/12 0512 09/05/12 0520 09/06/12 0450 09/07/12 0559  WBC 10.4 10.9* 11.2* 9.1 7.5 8.1  NEUTROABS 7.6  --   --   --   --   --   HGB 13.6 12.8 11.8* 11.0* 10.8* 10.6*  HCT 37.8 36.4 34.1* 32.7* 32.1* 30.7*  MCV 83.3 84.3 84.6 85.4 85.8 84.6  PLT 230 221 221 249 258 PLATELET CLUMPS NOTED ON SMEAR, COUNT APPEARS ADEQUATE   CBG:  Recent Labs Lab 09/06/12 0736 09/06/12 1141 09/06/12 1613 09/06/12 2201 09/07/12 0749  GLUCAP 237* 180* 156* 246* 242*    Recent Results (from the past 240 hour(s))  CULTURE, BLOOD (ROUTINE X 2)     Status: None   Collection Time    09/02/12 10:20 PM      Result Value Range Status  Specimen Description BLOOD RIGHT WRIST   Final   Special Requests BOTTLES DRAWN AEROBIC AND ANAEROBIC   Final   Culture  Setup Time 09/03/2012 03:48   Final   Culture     Final   Value:        BLOOD CULTURE RECEIVED NO GROWTH TO DATE CULTURE WILL BE HELD FOR 5 DAYS BEFORE ISSUING A FINAL NEGATIVE REPORT   Report Status PENDING   Incomplete  CULTURE, BLOOD (ROUTINE X 2)     Status: None   Collection Time    09/02/12 10:30 PM      Result Value Range Status   Specimen Description BLOOD LEFT ARM   Final   Special Requests BOTTLES DRAWN AEROBIC AND ANAEROBIC 5CC   Final   Culture  Setup Time 09/03/2012 03:48   Final   Culture     Final   Value: STAPHYLOCOCCUS AUREUS     Note: RIFAMPIN AND GENTAMICIN SHOULD NOT BE USED AS SINGLE DRUGS FOR TREATMENT OF  STAPH INFECTIONS.     Note: Gram Stain Report Called to,Read Back By and Verified With: CINDY SHELTON @0418  ON 09/04/12 BY MCLET   Report Status 09/06/2012 FINAL   Final   Organism ID, Bacteria STAPHYLOCOCCUS AUREUS   Final  SURGICAL PCR SCREEN     Status: Abnormal   Collection Time    09/03/12  3:58 AM      Result Value Range Status   MRSA, PCR NEGATIVE  NEGATIVE Final   Staphylococcus aureus POSITIVE (*) NEGATIVE Final   Comment:            The Xpert SA Assay (FDA     approved for NASAL specimens     in patients over 48 years of age),     is one component of     a comprehensive surveillance     program.  Test performance has     been validated by [employer] for patients greater     than or equal to 32 year old.     It is not intended     to diagnose infection nor to     guide or monitor treatment.  WOUND CULTURE     Status: None   Collection Time    09/06/12  5:56 PM      Result Value Range Status   Specimen Description FOOT LEFT   Final   Special Requests NONE   Final   Gram Stain     Final   Value: RARE WBC PRESENT,BOTH PMN AND MONONUCLEAR     NO SQUAMOUS EPITHELIAL CELLS SEEN     NO ORGANISMS SEEN   Culture PENDING   Incomplete   Report Status PENDING   Incomplete  ANAEROBIC CULTURE     Status: None   Collection Time    09/06/12  5:56 PM      Result Value Range Status   Specimen Description FOOT LEFT   Final   Special Requests NONE   Final   Gram Stain     Final   Value: RARE WBC PRESENT,BOTH PMN AND MONONUCLEAR     NO SQUAMOUS EPITHELIAL CELLS SEEN     NO ORGANISMS SEEN   Culture     Final   Value: NO ANAEROBES ISOLATED; CULTURE IN PROGRESS FOR 5 DAYS   Report Status PENDING   Incomplete      Studies/Results: Dg Chest Port 1 View  09/06/2012  *RADIOLOGY REPORT*  Clinical Data:  Evaluation of line placement.  PORTABLE CHEST - 1 VIEW  Comparison: 09/30/2008.  Findings: Tip of right sided venous catheter terminates in the right lower portion of superior  vena cava at cavoatrial junction. No pneumothorax is evident.  No pulmonary infiltrates are evident. No pleural abnormality is evident.  Cardiac silhouette is borderline size accentuated by the AP magnification.  IMPRESSION: Tip of right sided venous catheter terminates in the right lower portion of superior vena cava at cavoatrial junction.  No pneumothorax is evident.   Original Report Authenticated By: Onalee Hua Call     Medications:  Scheduled: .  ceFAZolin (ANCEF) IV  2 g Intravenous Q8H  . docusate sodium  100 mg Oral BID  . insulin aspart  0-15 Units Subcutaneous TID WC  . insulin aspart protamine-insulin aspart  30 Units Subcutaneous BID WC  . sodium chloride  10-40 mL Intracatheter Q12H   Continuous: . sodium chloride 100 mL/hr at 09/07/12 1011   ZOX:[id number], acetaminophen, HYDROmorphone (DILAUDID) injection, oxyCODONE, promethazine, sodium chloride  Assessment/Plan:  Principal Problem:   Cellulitis Active Problems:   DM (diabetes mellitus), type 2, uncontrolled with complications   Hyponatremia   Osteomyelitis   Bacteremia   MSSA (methicillin susceptible Staphylococcus aureus)    Left foot cellulitis with focal abscess.  Status post debridement 2/14. Doing well. Antibiotics changed by ID. No osteomyelitis noted on CT. Pain control. Doppler negative for DVT.   MSSA Bacteremia In one of two bottles. ID following. Management per them. ECHO did not reveal any obvious vegetations. Need for TEE deferred to ID.   Uncontrolled diabetes mellitus type 2 secondary to noncompliance  Hemoglobin A1c 16.8. Started Insulin 70/30 as she will not be able to afford any other insulin. Continue SSI. Will not increase dose today. Consider increasing 2/16 if CBG remains high.   Hyponatremia and Hypokalemia RN reports very high water/diet coke intake. Continue IVF.  Will restrict fluids.  Normocytic Anemia Likely due to chronic disease. Monitor.  DVT Prophylaxis SCD's. Initiate  Heparin now that I&D is done.  Lack of financial resources and primary care will make managing her chronic medical problems challenging.  Code Status: full Code Family Communication: No family at bedside  Disposition Plan: Unclear for now.    LOS: 5 days   [facility]  [facility] Pager [phone] 09/07/2012, 12:43 PM  If 8PM-8AM, please contact night-coverage at [email or web address], password [facility]

## 2012-09-08 LAB — IRON AND TIBC
Iron: 30 ug/dL — ABNORMAL LOW (ref 42–135)
UIBC: 113 ug/dL — ABNORMAL LOW (ref 125–400)

## 2012-09-08 LAB — GLUCOSE, CAPILLARY
Glucose-Capillary: 207 mg/dL — ABNORMAL HIGH (ref 70–99)
Glucose-Capillary: 156 mg/dL — ABNORMAL HIGH (ref 70–99)
Glucose-Capillary: 173 mg/dL — ABNORMAL HIGH (ref 70–99)

## 2012-09-08 LAB — RETICULOCYTES: Retic Count, Absolute: 105.9 10*3/uL (ref 19.0–186.0)

## 2012-09-08 LAB — FOLATE: Folate: 14.6 ng/mL

## 2012-09-08 MED ORDER — CYCLOBENZAPRINE HCL 10 MG PO TABS
10.0000 mg | ORAL_TABLET | Freq: Three times a day (TID) | ORAL | Status: DC | PRN
  Administered 2012-09-08 (×2): 10 mg via ORAL
  Filled 2012-09-08 (×2): qty 1

## 2012-09-08 NOTE — Progress Notes (Signed)
Patient ID: Karen Bennett, female   DOB: 14-Sep-1975, 37 y.o.   MRN: [id number] PATIENT ID: Karen Bennett        MRN:  [id number]          DOB/AGE: 1975/09/04 / 37 y.o.    Norlene Campbell, MD   Karen Code, PA-C [address] [place], [place]  [address]                             [phone]   PROGRESS NOTE  Subjective:  negative for Chest Pain  negative for Shortness of Breath  negative for Nausea/Vomiting   negative for Calf Pain    Tolerating Diet: yes         Patient reports pain as mild.    Left foot feels less tight, minimal pain  Objective: Vital signs in last 24 hours:   Patient Vitals for the past 24 hrs:  BP Temp Temp src Pulse Resp SpO2  09/08/12 0610 119/76 mmHg 98.3 F (36.8 C) Oral 78 20 95 %  09/07/12 2200 127/75 mmHg 98.2 F (36.8 C) Oral 76 20 96 %  09/07/12 1436 136/59 mmHg 99 F (37.2 C) Oral 88 20 95 %      Intake/Output from previous day:       Intake/Output this shift:       Intake/Output     02/15 0701 - 02/16 0700 02/16 0701 - 02/17 0700   P.O.     I.V. (mL/kg)     IV Piggyback     Total Intake(mL/kg)     Net               LABORATORY DATA:  Recent Labs  09/02/12 2220 09/03/12 0505 09/04/12 0512 09/05/12 0520 09/06/12 0450 09/07/12 0559  WBC 10.4 10.9* 11.2* 9.1 7.5 8.1  HGB 13.6 12.8 11.8* 11.0* 10.8* 10.6*  HCT 37.8 36.4 34.1* 32.7* 32.1* 30.7*  PLT 230 221 221 249 258 PLATELET CLUMPS NOTED ON SMEAR, COUNT APPEARS ADEQUATE    Recent Labs  09/02/12 2220 09/03/12 0505 09/04/12 0512 09/05/12 0520 09/06/12 0450 09/07/12 0559  NA 126* 127* 125* 126* 128* 129*  K 3.8 3.5 3.8 3.6 3.3* 4.0  CL 90* 93* 92* 94* 92* 98  CO2 23 24 23 25 24 20   BUN 9 9 8 8 7 7   CREATININE 0.47* 0.68 0.63 0.62 0.58 0.51  GLUCOSE 339* 290* 330* 315* 234* 285*  CALCIUM 8.9 8.7 8.5 8.8 8.7 8.8   Lab Results  Component Value Date   INR 0.9 03/12/2009   INR 2.7* 05/07/2008   INR 2.6* 05/05/2008    Recent Radiographic Studies  :  Ct Foot Left W Contrast  09/04/2012  *RADIOLOGY REPORT*  Clinical Data: Diabetic with left foot infection.  Question abscess.  History of partial amputation.  CT OF THE LEFT FOOT WITH CONTRAST  Technique:  Multidetector CT imaging was performed following the standard protocol during bolus administration of intravenous contrast.  Contrast: OMNIPAQUE IOHEXOL 300 MG/ML  SOLN  Comparison: Radiographs 11/10/2011 and 09/02/2012.  CT 09/05/2011.  Findings: There are new extensive inflammatory changes within the remaining lateral forefoot.  There is a focal abscess adjacent to the remnant of the fifth metatarsal.  This measures 1.4 x 2.2 x 1.4 cm and shows peripheral enhancement.  Peripheral to this well- circumscribed fluid collection is irregular enhancing soft tissue, likely a phlegmon. A small amount of peripherally enhancing fluid appears  to extend along the plantar aspect of the Lisfranc joint. No soft tissue emphysema or foreign body is identified.  The remnants of the fourth and fifth metatarsals appear stable status post partial amputation.  There is no evidence of progressive osteomyelitis.  Midfoot degenerative changes are stable.  There is diffuse muscular atrophy throughout the foot.  IMPRESSION:  1.  Recurrent soft tissue infection laterally in the foot with focal abscess adjacent to the remnant of the fifth metatarsal. There is probable extension of infection along the plantar aspect of the Lisfranc joint. 2.  No evidence of recurrent osteomyelitis.   Original Report Authenticated By: Carey Bullocks, M.D.    Dg Chest Port 1 View  09/06/2012  *RADIOLOGY REPORT*  Clinical Data: Evaluation of line placement.  PORTABLE CHEST - 1 VIEW  Comparison: 09/30/2008.  Findings: Tip of right sided venous catheter terminates in the right lower portion of superior vena cava at cavoatrial junction. No pneumothorax is evident.  No pulmonary infiltrates are evident. No pleural abnormality is evident.  Cardiac  silhouette is borderline size accentuated by the AP magnification.  IMPRESSION: Tip of right sided venous catheter terminates in the right lower portion of superior vena cava at cavoatrial junction.  No pneumothorax is evident.   Original Report Authenticated By: Onalee Hua Call    Dg Foot Complete Left  09/02/2012  *RADIOLOGY REPORT*  Clinical Data: Diabetes.  Swelling.  Pain.  Wound infection.  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  Extensive prominent overlying and plantar soft tissue swelling noted with a small amount of suspected gas in the soft tissues. Chronic fragmented spurring along the medial base of the remaining fourth metatarsal observed.  The there is some progressive erosion of the distal margin of the fifth metatarsal which could reflect osteomyelitis.  Dorsal midfoot spurring observed. Subcutaneous edema noted dorsally in the foot and ankle.  IMPRESSION:  1. Extensive and increased soft tissue swelling adjacent to the fourth and fifth metatarsals, with some progressive erosion of the distal fifth metatarsal raising the possibility of osteomyelitis, and a small amount of gas along the irregular soft tissue margins favoring ulceration. 2.  Subcutaneous edema noted dorsally in the foot and ankle.   Original Report Authenticated By: Gaylyn Rong, M.D.      Examination:  General appearance: alert and morbidly obese  Wound Exam: draining   Drainage:  Scant/small amount Serosanguinous   Assessment:    2 Days Post-Op  Procedure(s) (LRB): IRRIGATION AND DEBRIDEMENT EXTREMITY (Left)    Left foot with decreased swelling, erythema,pain. Penrose advanced with minimal drainage, new dressing in place. Cultures with MSSA, on appropriate antiobiotic.        Valeria Batman 09/08/2012 12:37 PM

## 2012-09-08 NOTE — Progress Notes (Signed)
[facility] PROGRESS NOTE  Fulton Och ZOX:[id number] DOB: 10/13/1975 DOA: 09/02/2012  PCP: No PCP  Brief HPI: 37 year-old female with history of diabetes mellitus type 2 not on medication secondary to financial issues presented with complaint of worsening swelling pain and discharge from left foot. 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 In the ER patient had x-ray of the left foot which shows possibility of osteomyelitis and on clinical exam patient has significant swelling concerning for cellulitis. Patient's blood sugar is also uncontrolled.  Past medical history:  Past Medical History  Diagnosis Date  . Cellulitis   . Diabetes mellitus   . PE (pulmonary embolism) ~2007-2008    Not on anticoagulation    Consultants: Ortho (Dr. Ophelia Charter)  Procedures: I&D 2/14 of left foot  Antibiotics: IV Vanc 2/10-->2/14 IV Zosyn 2/10-->2/14 IV Cefazolin 2/14-->  Subjective: Patient feels about the same. Pain is stable. No new complaints.  Objective: Vital Signs  Filed Vitals:   09/07/12 0634 09/07/12 1436 09/07/12 2200 09/08/12 0610  BP: 129/97 136/59 127/75 119/76  Pulse: 73 88 76 78  Temp: 98.8 F (37.1 C) 99 F (37.2 C) 98.2 F (36.8 C) 98.3 F (36.8 C)  TempSrc: Oral Oral Oral Oral  Resp: 18 20 20 20   Height:      Weight:      SpO2: 98% 95% 96% 95%   No intake or output data in the 24 hours ending 09/08/12 1054 Filed Weights   09/03/12 0356  Weight: 177 kg (390 lb 3.4 oz)    General appearance: alert, cooperative, appears stated age and morbidly obese Resp: clear to auscultation bilaterally Cardio: regular rate and rhythm, S1, S2 normal. Systolic murmur in aortic area. No click, rub or gallop GI: soft, non-tender; bowel sounds normal; no masses,  no organomegaly Extremities: left foot covered with dressing. Pulses: 2+ and symmetric Neurologic: Alert and oriented x 3. No focal deficits.  Lab  Results:  Basic Metabolic Panel:  Recent Labs Lab 09/03/12 0505 09/04/12 0512 09/05/12 0520 09/06/12 0450 09/07/12 0559  NA 127* 125* 126* 128* 129*  K 3.5 3.8 3.6 3.3* 4.0  CL 93* 92* 94* 92* 98  CO2 24 23 25 24 20   GLUCOSE 290* 330* 315* 234* 285*  BUN 9 8 8 7 7   CREATININE 0.68 0.63 0.62 0.58 0.51  CALCIUM 8.7 8.5 8.8 8.7 8.8   CBC:  Recent Labs Lab 09/02/12 2220 09/03/12 0505 09/04/12 0512 09/05/12 0520 09/06/12 0450 09/07/12 0559  WBC 10.4 10.9* 11.2* 9.1 7.5 8.1  NEUTROABS 7.6  --   --   --   --   --   HGB 13.6 12.8 11.8* 11.0* 10.8* 10.6*  HCT 37.8 36.4 34.1* 32.7* 32.1* 30.7*  MCV 83.3 84.3 84.6 85.4 85.8 84.6  PLT 230 221 221 249 258 PLATELET CLUMPS NOTED ON SMEAR, COUNT APPEARS ADEQUATE   CBG:  Recent Labs Lab 09/07/12 0749 09/07/12 1242 09/07/12 1728 09/07/12 2226 09/08/12 0728  GLUCAP 242* 164* 198* 207* 156*    Recent Results (from the past 240 hour(s))  CULTURE, BLOOD (ROUTINE X 2)     Status: None   Collection Time    09/02/12 10:20 PM      Result Value Range Status   Specimen Description BLOOD RIGHT WRIST   Final   Special Requests BOTTLES DRAWN AEROBIC AND ANAEROBIC   Final   Culture  Setup Time 09/03/2012 03:48   Final  Culture     Final   Value:        BLOOD CULTURE RECEIVED NO GROWTH TO DATE CULTURE WILL BE HELD FOR 5 DAYS BEFORE ISSUING A FINAL NEGATIVE REPORT   Report Status PENDING   Incomplete  CULTURE, BLOOD (ROUTINE X 2)     Status: None   Collection Time    09/02/12 10:30 PM      Result Value Range Status   Specimen Description BLOOD LEFT ARM   Final   Special Requests BOTTLES DRAWN AEROBIC AND ANAEROBIC 5CC   Final   Culture  Setup Time 09/03/2012 03:48   Final   Culture     Final   Value: STAPHYLOCOCCUS AUREUS     Note: RIFAMPIN AND GENTAMICIN SHOULD NOT BE USED AS SINGLE DRUGS FOR TREATMENT OF STAPH INFECTIONS.     Note: Gram Stain Report Called to,Read Back By and Verified With: CINDY SHELTON @0418  ON 09/04/12  BY MCLET   Report Status 09/06/2012 FINAL   Final   Organism ID, Bacteria STAPHYLOCOCCUS AUREUS   Final  SURGICAL PCR SCREEN     Status: Abnormal   Collection Time    09/03/12  3:58 AM      Result Value Range Status   MRSA, PCR NEGATIVE  NEGATIVE Final   Staphylococcus aureus POSITIVE (*) NEGATIVE Final   Comment:            The Xpert SA Assay (FDA     approved for NASAL specimens     in patients over 17 years of age),     is one component of     a comprehensive surveillance     program.  Test performance has     been validated by [employer] for patients greater     than or equal to 25 year old.     It is not intended     to diagnose infection nor to     guide or monitor treatment.  WOUND CULTURE     Status: None   Collection Time    09/06/12  5:56 PM      Result Value Range Status   Specimen Description FOOT LEFT   Final   Special Requests NONE   Final   Gram Stain     Final   Value: RARE WBC PRESENT,BOTH PMN AND MONONUCLEAR     NO SQUAMOUS EPITHELIAL CELLS SEEN     NO ORGANISMS SEEN   Culture NO GROWTH 1 DAY   Final   Report Status PENDING   Incomplete  ANAEROBIC CULTURE     Status: None   Collection Time    09/06/12  5:56 PM      Result Value Range Status   Specimen Description FOOT LEFT   Final   Special Requests NONE   Final   Gram Stain     Final   Value: RARE WBC PRESENT,BOTH PMN AND MONONUCLEAR     NO SQUAMOUS EPITHELIAL CELLS SEEN     NO ORGANISMS SEEN   Culture     Final   Value: NO ANAEROBES ISOLATED; CULTURE IN PROGRESS FOR 5 DAYS   Report Status PENDING   Incomplete      Studies/Results: Dg Chest Port 1 View  09/06/2012  *RADIOLOGY REPORT*  Clinical Data: Evaluation of line placement.  PORTABLE CHEST - 1 VIEW  Comparison: 09/30/2008.  Findings: Tip of right sided venous catheter terminates in the right lower portion of superior vena cava  at cavoatrial junction. No pneumothorax is evident.  No pulmonary infiltrates are evident. No pleural  abnormality is evident.  Cardiac silhouette is borderline size accentuated by the AP magnification.  IMPRESSION: Tip of right sided venous catheter terminates in the right lower portion of superior vena cava at cavoatrial junction.  No pneumothorax is evident.   Original Report Authenticated By: Onalee Hua Call     Medications:  Scheduled: .  ceFAZolin (ANCEF) IV  2 g Intravenous Q8H  . docusate sodium  100 mg Oral BID  . heparin subcutaneous  5,000 Units Subcutaneous Q8H  . insulin aspart  0-15 Units Subcutaneous TID WC  . insulin aspart protamine-insulin aspart  30 Units Subcutaneous BID WC  . sodium chloride  10-40 mL Intracatheter Q12H   Continuous: . sodium chloride 100 mL/hr at 09/08/12 1610   RUE:[id number], acetaminophen, cyclobenzaprine, HYDROmorphone (DILAUDID) injection, oxyCODONE, promethazine, sodium chloride  Assessment/Plan:  Principal Problem:   Cellulitis Active Problems:   DM (diabetes mellitus), type 2, uncontrolled with complications   Hyponatremia   Osteomyelitis   Bacteremia   MSSA (methicillin susceptible Staphylococcus aureus)    Left foot cellulitis with focal abscess.  Status post debridement 2/14. Doing well. Antibiotics changed by ID to Cefazolin. No osteomyelitis noted on CT. Pain control. Doppler negative for DVT.   MSSA Bacteremia In one of two bottles. ID following. Management per them. ECHO did not reveal any obvious vegetations. Need for TEE deferred to ID.   Uncontrolled diabetes mellitus type 2 secondary to noncompliance  Hemoglobin A1c 16.8. Started Insulin 70/30 as she will not be able to afford any other insulin. Continue SSI. Continue current dose.   Hyponatremia and Hypokalemia RN reports very high water/diet coke intake. Continue IVF.  Will restrict fluids. Repeat labs tomorrow.  Normocytic Anemia Likely due to chronic disease. Monitor. Anemia panel pending.  DVT Prophylaxis Heparin  Lack of financial resources and primary care  will make managing her chronic medical problems challenging.  Code Status: full Code Family Communication: No family at bedside  Disposition Plan: Unclear for now.    LOS: 6 days   [facility]  [facility] Pager [phone] 09/08/2012, 10:54 AM  If 8PM-8AM, please contact night-coverage at [email or web address], password [facility]

## 2012-09-09 ENCOUNTER — Encounter (HOSPITAL_COMMUNITY): Payer: Self-pay | Admitting: Orthopaedic Surgery

## 2012-09-09 LAB — CBC
MCH: 29.2 pg (ref 26.0–34.0)
MCV: 87.5 fL (ref 78.0–100.0)
Platelets: 274 10*3/uL (ref 150–400)
RDW: 13 % (ref 11.5–15.5)

## 2012-09-09 LAB — BASIC METABOLIC PANEL
Calcium: 8.7 mg/dL (ref 8.4–10.5)
Creatinine, Ser: 0.53 mg/dL (ref 0.50–1.10)
GFR calc Af Amer: >90 mL/min (ref >90)

## 2012-09-09 LAB — CULTURE, BLOOD (ROUTINE X 2): Culture: NO GROWTH

## 2012-09-09 MED ORDER — "INSULIN SYRINGE 28G X 1/2"" 0.5 ML MISC": Status: DC

## 2012-09-09 MED ORDER — INSULIN ASPART PROT & ASPART (70-30 MIX) 100 UNIT/ML ~~LOC~~ SUSP: 30.0000 [IU] | Freq: Two times a day (BID) | SUBCUTANEOUS | Status: DC

## 2012-09-09 MED ORDER — OXYCODONE HCL 5 MG PO TABS: 5.0000 mg | ORAL_TABLET | ORAL | Status: DC | PRN

## 2012-09-09 MED ORDER — HEPARIN SOD (PORK) LOCK FLUSH 100 UNIT/ML IV SOLN
250.0000 [IU] | INTRAVENOUS | Status: AC | PRN
  Administered 2012-09-09: 250 [IU]

## 2012-09-09 MED ORDER — ACETAMINOPHEN 325 MG PO TABS: 650.0000 mg | ORAL_TABLET | Freq: Four times a day (QID) | ORAL | Status: DC | PRN

## 2012-09-09 MED ORDER — DSS 100 MG PO CAPS: 100.0000 mg | ORAL_CAPSULE | Freq: Two times a day (BID) | ORAL | Status: DC

## 2012-09-09 MED ORDER — HEPARIN (PORCINE) LOCK FLUSH 10 UNIT/ML IV SOLN: 10.0000 [IU] | INTRAVENOUS | Status: DC | PRN

## 2012-09-09 MED ORDER — BLOOD GLUCOSE METER KIT: PACK | Status: DC

## 2012-09-09 MED ORDER — CEFAZOLIN SODIUM-DEXTROSE 2-3 GM-% IV SOLR: 2.0000 g | Freq: Three times a day (TID) | INTRAVENOUS | Status: DC

## 2012-09-09 MED ORDER — METHOCARBAMOL 500 MG PO TABS: 500.0000 mg | ORAL_TABLET | Freq: Three times a day (TID) | ORAL | Status: DC | PRN

## 2012-09-09 MED ORDER — METHOCARBAMOL 500 MG PO TABS
500.0000 mg | ORAL_TABLET | Freq: Three times a day (TID) | ORAL | Status: DC | PRN
  Administered 2012-09-09: 500 mg via ORAL
  Filled 2012-09-09 (×2): qty 1

## 2012-09-09 NOTE — Progress Notes (Signed)
[facility] PROGRESS NOTE  [place] Karen Bennett:[id number] DOB: 01/11/1976 DOA: 09/02/2012  PCP: No PCP  Brief HPI: 37 year-old female with history of diabetes mellitus type 2 not on medication secondary to financial issues presented with complaint of worsening swelling pain and discharge from left foot. 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 In the ER patient had x-ray of the left foot which shows possibility of osteomyelitis and on clinical exam patient has significant swelling concerning for cellulitis. Patient's blood sugar is also uncontrolled.  Past medical history:  Past Medical History  Diagnosis Date  . Cellulitis   . Diabetes mellitus   . PE (pulmonary embolism) ~2007-2008    Not on anticoagulation    Consultants: Ortho (Dr. Ophelia Charter)  Procedures: I&D 2/14 of left foot  Antibiotics: IV Vanc 2/10-->2/14 IV Zosyn 2/10-->2/14 IV Cefazolin 2/14-->  Subjective: Patient feels better. Pain is reasonably well controlled. No new complaints.  Objective: Vital Signs  Filed Vitals:   09/08/12 0610 09/08/12 1358 09/08/12 2210 09/09/12 0500  BP: 119/76 126/75 125/74 124/74  Pulse: 78 78 71 76  Temp: 98.3 F (36.8 C) 98.3 F (36.8 C) 98.3 F (36.8 C) 98.1 F (36.7 C)  TempSrc: Oral Oral Oral Oral  Resp: 20 20 18 18   Height:      Weight:      SpO2: 95% 92% 90% 94%    Intake/Output Summary (Last 24 hours) at 09/09/12 1140 Last data filed at 09/08/12 2141  Gross per 24 hour  Intake    150 ml  Output      0 ml  Net    150 ml   Filed Weights   09/03/12 0356  Weight: 177 kg (390 lb 3.4 oz)    General appearance: alert, cooperative, appears stated age and morbidly obese Resp: clear to auscultation bilaterally Cardio: regular rate and rhythm, S1, S2 normal. Systolic murmur in aortic area. No click, rub or gallop GI: soft, non-tender; bowel sounds normal; no masses,  no organomegaly Extremities: left  foot covered with dressing. Pulses: 2+ and symmetric Neurologic: Alert and oriented x 3. No focal deficits.  Lab Results:  Basic Metabolic Panel:  Recent Labs Lab 09/04/12 0512 09/05/12 0520 09/06/12 0450 09/07/12 0559 09/09/12 0500  NA 125* 126* 128* 129* 133*  K 3.8 3.6 3.3* 4.0 3.9  CL 92* 94* 92* 98 99  CO2 23 25 24 20 25   GLUCOSE 330* 315* 234* 285* 165*  BUN 8 8 7 7 7   CREATININE 0.63 0.62 0.58 0.51 0.53  CALCIUM 8.5 8.8 8.7 8.8 8.7   CBC:  Recent Labs Lab 09/02/12 2220  09/04/12 0512 09/05/12 0520 09/06/12 0450 09/07/12 0559 09/09/12 0500  WBC 10.4  < > 11.2* 9.1 7.5 8.1 7.4  NEUTROABS 7.6  --   --   --   --   --   --   HGB 13.6  < > 11.8* 11.0* 10.8* 10.6* 9.8*  HCT 37.8  < > 34.1* 32.7* 32.1* 30.7* 29.4*  MCV 83.3  < > 84.6 85.4 85.8 84.6 87.5  PLT 230  < > 221 249 258 PLATELET CLUMPS NOTED ON SMEAR, COUNT APPEARS ADEQUATE 274  < > = values in this interval not displayed. CBG:  Recent Labs Lab 09/08/12 0728 09/08/12 1139 09/08/12 1652 09/08/12 2207 09/09/12 0815  GLUCAP 156* 140* 143* 173* 163*    Recent Results (from the past 240 hour(s))  CULTURE, BLOOD (ROUTINE X  2)     Status: None   Collection Time    09/02/12 10:20 PM      Result Value Range Status   Specimen Description BLOOD RIGHT WRIST   Final   Special Requests BOTTLES DRAWN AEROBIC AND ANAEROBIC   Final   Culture  Setup Time 09/03/2012 03:48   Final   Culture NO GROWTH 5 DAYS   Final   Report Status 09/09/2012 FINAL   Final  CULTURE, BLOOD (ROUTINE X 2)     Status: None   Collection Time    09/02/12 10:30 PM      Result Value Range Status   Specimen Description BLOOD LEFT ARM   Final   Special Requests BOTTLES DRAWN AEROBIC AND ANAEROBIC 5CC   Final   Culture  Setup Time 09/03/2012 03:48   Final   Culture     Final   Value: STAPHYLOCOCCUS AUREUS     Note: RIFAMPIN AND GENTAMICIN SHOULD NOT BE USED AS SINGLE DRUGS FOR TREATMENT OF STAPH INFECTIONS.     Note: Gram Stain  Report Called to,Read Back By and Verified With: CINDY SHELTON @0418  ON 09/04/12 BY MCLET   Report Status 09/06/2012 FINAL   Final   Organism ID, Bacteria STAPHYLOCOCCUS AUREUS   Final  SURGICAL PCR SCREEN     Status: Abnormal   Collection Time    09/03/12  3:58 AM      Result Value Range Status   MRSA, PCR NEGATIVE  NEGATIVE Final   Staphylococcus aureus POSITIVE (*) NEGATIVE Final   Comment:            The Xpert SA Assay (FDA     approved for NASAL specimens     in patients over 7 years of age),     is one component of     a comprehensive surveillance     program.  Test performance has     been validated by [employer] for patients greater     than or equal to 87 year old.     It is not intended     to diagnose infection nor to     guide or monitor treatment.  CULTURE, BLOOD (ROUTINE X 2)     Status: None   Collection Time    09/06/12  3:05 PM      Result Value Range Status   Specimen Description BLOOD LEFT ARM   Final   Special Requests BOTTLES DRAWN AEROBIC ONLY    Final   Culture  Setup Time 09/07/2012 00:10   Final   Culture     Final   Value:        BLOOD CULTURE RECEIVED NO GROWTH TO DATE CULTURE WILL BE HELD FOR 5 DAYS BEFORE ISSUING A FINAL NEGATIVE REPORT   Report Status PENDING   Incomplete  CULTURE, BLOOD (ROUTINE X 2)     Status: None   Collection Time    09/06/12  3:12 PM      Result Value Range Status   Specimen Description BLOOD LEFT ARM   Final   Special Requests BOTTLES DRAWN AEROBIC AND ANAEROBIC    Final   Culture  Setup Time 09/07/2012 00:10   Final   Culture     Final   Value:        BLOOD CULTURE RECEIVED NO GROWTH TO DATE CULTURE WILL BE HELD FOR 5 DAYS BEFORE ISSUING A FINAL NEGATIVE REPORT   Report Status  PENDING   Incomplete  WOUND CULTURE     Status: None   Collection Time    09/06/12  5:56 PM      Result Value Range Status   Specimen Description FOOT LEFT   Final   Special Requests NONE   Final   Gram Stain     Final   Value:  RARE WBC PRESENT,BOTH PMN AND MONONUCLEAR     NO SQUAMOUS EPITHELIAL CELLS SEEN     NO ORGANISMS SEEN   Culture Culture reincubated for better growth   Final   Report Status PENDING   Incomplete  ANAEROBIC CULTURE     Status: None   Collection Time    09/06/12  5:56 PM      Result Value Range Status   Specimen Description FOOT LEFT   Final   Special Requests NONE   Final   Gram Stain     Final   Value: RARE WBC PRESENT,BOTH PMN AND MONONUCLEAR     NO SQUAMOUS EPITHELIAL CELLS SEEN     NO ORGANISMS SEEN   Culture     Final   Value: NO ANAEROBES ISOLATED; CULTURE IN PROGRESS FOR 5 DAYS   Report Status PENDING   Incomplete      Studies/Results: No results found.  Medications:  Scheduled: .  ceFAZolin (ANCEF) IV  2 g Intravenous Q8H  . docusate sodium  100 mg Oral BID  . heparin subcutaneous  5,000 Units Subcutaneous Q8H  . insulin aspart  0-15 Units Subcutaneous TID WC  . insulin aspart protamine-insulin aspart  30 Units Subcutaneous BID WC  . sodium chloride  10-40 mL Intracatheter Q12H   Continuous: . sodium chloride 100 mL/hr at 09/09/12 1030   Bennett:[id number], acetaminophen, HYDROmorphone (DILAUDID) injection, methocarbamol, oxyCODONE, promethazine, sodium chloride  Assessment/Plan:  Principal Problem:   Cellulitis Active Problems:   DM (diabetes mellitus), type 2, uncontrolled with complications   Hyponatremia   Osteomyelitis   Bacteremia   MSSA (methicillin susceptible Staphylococcus aureus)    Left foot cellulitis with focal abscess.  Status post debridement 2/14. Doing well. Antibiotics changed by ID to Cefazolin. No osteomyelitis noted on CT. Pain control. Doppler negative for DVT.   MSSA Bacteremia In one of two bottles. ID following. IV Cefazolin for 4 weeks. ECHO did not reveal any obvious vegetations. Need for TEE deferred to ID.   Uncontrolled diabetes mellitus type 2 secondary to noncompliance  Hemoglobin A1c 16.8. Started Insulin 70/30 as she  will not be able to afford any other insulin. Continue SSI. Continue current dose.   Hyponatremia and Hypokalemia Sodium levels much improved with fluid restriction. Patient to be instructed on same. RN reports very high water/diet coke intake.   Normocytic Anemia Likely due to chronic disease. Monitor. Anemia panel reviewed. No iron deficiency.  DVT Prophylaxis Heparin  Lack of financial resources and primary care will make managing her chronic medical problems challenging.  Code Status: full Code Family Communication: No family at bedside  Disposition Plan: Await ID input prior to discharge.    LOS: 7 days   [facility]  [facility] Pager [phone] 09/09/2012, 11:40 AM  If 8PM-8AM, please contact night-coverage at [email or web address], password [facility]

## 2012-09-09 NOTE — Progress Notes (Signed)
Discharge instructions given to pt, verbalized understanding. Left the unit in stable condition. 

## 2012-09-09 NOTE — Progress Notes (Signed)
I provided pt with information for [facility] located at the [facility] at [address] [phone]. They are open 10 am -7 pm Monday -Friday which will work well for pt since she stated her friend who can provide transportation is only available after 4 pm. I asked pt to call to make an appt and also see what documents they need her to present for eligibility purposes. Pt voiced understanding. Pt will be  getting HHRN services for IV antibiotics and wound care through [facility]. I will also assist her with medications through the [facility] program.

## 2012-09-09 NOTE — Progress Notes (Signed)
    [facility]    Date of Admission:  09/02/2012   Total days of antibiotics 8        Day 4 cefazolin           ID: Karen Bennett is a 37 y.o. female with poorly controlled diabetes who presents with MSSA bacteremia and diabetic foot uler, deep tissue abscess s/p I x D on 2/15 Principal Problem:   Cellulitis Active Problems:   DM (diabetes mellitus), type 2, uncontrolled with complications   Hyponatremia   Osteomyelitis   Bacteremia   MSSA (methicillin susceptible Staphylococcus aureus)    Subjective: afebrile  Medications:  .  ceFAZolin (ANCEF) IV  2 g Intravenous Q8H  . docusate sodium  100 mg Oral BID  . heparin subcutaneous  5,000 Units Subcutaneous Q8H  . insulin aspart  0-15 Units Subcutaneous TID WC  . insulin aspart protamine-insulin aspart  30 Units Subcutaneous BID WC  . sodium chloride  10-40 mL Intracatheter Q12H    Objective: Vital signs in last 24 hours: Temp:  [98.1 F (36.7 C)-98.3 F (36.8 C)] 98.1 F (36.7 C) (02/17 0500) Pulse Rate:  [71-78] 76 (02/17 0500) Resp:  [18-20] 18 (02/17 0500) BP: (124-126)/(74-75) 124/74 mmHg (02/17 0500) SpO2:  [90 %-94 %] 94 % (02/17 0500)  Ext:  left lateral foot wrapped since surgery     Lab Results  Recent Labs  09/07/12 0559 09/09/12 0500  WBC 8.1 7.4  HGB 10.6* 9.8*  HCT 30.7* 29.4*  NA 129* 133*  K 4.0 3.9  CL 98 99  CO2 20 25  BUN 7 7  CREATININE 0.51 0.53    Microbiology: 2/14 wound cx reincubating 2/14 blood cx NGTD 2/10 blood cx 1 of 2 sets MSSA Studies/Results: No results found.   Assessment/Plan: 37 y.o. female with poorly controlled diabetes who presents with MSSA bacteremia and diabetic foot uler, deep tissue abscess s/p I x D on 2/14.  Would treat for a total of 28 days of therapy with cefazolin 2gm IV every 8hrs. Count first day of antibiotics as 2/15 (day after surgery and documentation of no longer being bacteremic. ) End date of antibiotics is  March 14th, 2014.  Will need weekly dressing changes of picc line by home health.  We will have her be seen in the ID clinic in 7-10days and March 13th.  Drue [facility] Cell: [phone] Pager: [phone]  09/09/2012, 1:49 PM

## 2012-09-09 NOTE — Discharge Summary (Signed)
[facility]  Physician Discharge Summary   Patient ID: Karen Bennett MRN: [id number] DOB/AGE: 09-26-1975 37 y.o.  Admit date: 09/02/2012 Discharge date: 09/09/2012  PCP: Patient has been given information for the indigent clinic at [facility] urgent care.  DISCHARGE DIAGNOSES:  Principal Problem:   Cellulitis Active Problems:   DM (diabetes mellitus), type 2, uncontrolled with complications   Hyponatremia   Osteomyelitis   Bacteremia   MSSA (methicillin susceptible Staphylococcus aureus)   RECOMMENDATIONS FOR OUTPATIENT FOLLOW UP: 1. Needs follow up for management of diabetes. 2. Home IV cefazolin for 4 weeks.  DISCHARGE CONDITION: fair  Diet recommendation: Mod Carb  Filed Weights   09/03/12 0356  Weight: 177 kg (390 lb 3.4 oz)    INITIAL HISTORY: 37 year-old female with history of diabetes mellitus type 2 not on medication secondary to financial issues presented with complaint of worsening swelling pain and discharge from left foot. 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 In the ER patient had x-ray of the left foot which shows possibility of osteomyelitis and on clinical exam patient has significant swelling concerning for cellulitis. Patient's blood sugar is also uncontrolled.  Consultations:  Dr. Ophelia Charter (ortho)  ID  Procedures: I&D 2/14 of left foot  2D ECHO Study Conclusions  - Left ventricle: The cavity size was normal. Wall thickness was increased in a pattern of moderate LVH. The estimated ejection fraction was 55%. Regional wall motion abnormalities cannot be excluded. - Left atrium: The atrium was mildly dilated. - Impressions: echnically limited study. No obvious vegetations.  HOSPITAL COURSE:   Left foot cellulitis with focal abscess.  She was started on broad spectrum antibiotics and was seen by ortho. Plan was for debridement which got delayed due to inclement weather. This was done  in the OR subsequently. Patient remained stable. She will follow up with Dr. Ophelia Charter. Wound care will be provided at home. Antibiotics changed to by ID to Cefazolin. No osteomyelitis noted on CT. Doppler negative for DVT. Cultures from the wound are pending.   MSSA Bacteremia  In one of two bottles. ID has seen and ECHO was done. TTE did not reveal any vegetations. IV Cefazolin for 4 weeks till 3/16. ECHO did not reveal any obvious vegetations. ID will arrange follow up. Repeat blood cultures are negative so far.  Uncontrolled diabetes mellitus type 2 secondary to noncompliance  Hemoglobin A1c 16.8. Started Insulin 70/30 as she will not be able to afford any other insulin. Dose was adjusted and CBG are finally under 200. Continue current dose.   Hyponatremia and Hypokalemia  Sodium levels much improved with fluid restriction. Patient to be instructed on same. Potassium was repleted.  Normocytic Anemia  Likely due to chronic disease. Anemia panel reviewed. No iron deficiency.   Overall patient is improved. Further management can be pursued at home. She will be discharged with home health for IV antibiotics and wound care. Patient is agreeable.  PERTINENT LABS:  The results of significant diagnostics from this hospitalization (including imaging, microbiology, ancillary and laboratory) are listed below for reference.    Microbiology: Recent Results (from the past 240 hour(s))  CULTURE, BLOOD (ROUTINE X 2)     Status: None   Collection Time    09/02/12 10:20 PM      Result Value Range Status   Specimen Description BLOOD RIGHT WRIST   Final   Special Requests BOTTLES DRAWN AEROBIC AND ANAEROBIC   Final  Culture  Setup Time 09/03/2012 03:48   Final   Culture NO GROWTH 5 DAYS   Final   Report Status 09/09/2012 FINAL   Final  CULTURE, BLOOD (ROUTINE X 2)     Status: None   Collection Time    09/02/12 10:30 PM      Result Value Range Status   Specimen Description BLOOD LEFT ARM   Final    Special Requests BOTTLES DRAWN AEROBIC AND ANAEROBIC 5CC   Final   Culture  Setup Time 09/03/2012 03:48   Final   Culture     Final   Value: STAPHYLOCOCCUS AUREUS     Note: RIFAMPIN AND GENTAMICIN SHOULD NOT BE USED AS SINGLE DRUGS FOR TREATMENT OF STAPH INFECTIONS.     Note: Gram Stain Report Called to,Read Back By and Verified With: CINDY SHELTON @0418  ON 09/04/12 BY MCLET   Report Status 09/06/2012 FINAL   Final   Organism ID, Bacteria STAPHYLOCOCCUS AUREUS   Final  SURGICAL PCR SCREEN     Status: Abnormal   Collection Time    09/03/12  3:58 AM      Result Value Range Status   MRSA, PCR NEGATIVE  NEGATIVE Final   Staphylococcus aureus POSITIVE (*) NEGATIVE Final   Comment:            The Xpert SA Assay (FDA     approved for NASAL specimens     in patients over 72 years of age),     is one component of     a comprehensive surveillance     program.  Test performance has     been validated by [employer] for patients greater     than or equal to 36 year old.     It is not intended     to diagnose infection nor to     guide or monitor treatment.  CULTURE, BLOOD (ROUTINE X 2)     Status: None   Collection Time    09/06/12  3:05 PM      Result Value Range Status   Specimen Description BLOOD LEFT ARM   Final   Special Requests BOTTLES DRAWN AEROBIC ONLY    Final   Culture  Setup Time 09/07/2012 00:10   Final   Culture     Final   Value:        BLOOD CULTURE RECEIVED NO GROWTH TO DATE CULTURE WILL BE HELD FOR 5 DAYS BEFORE ISSUING A FINAL NEGATIVE REPORT   Report Status PENDING   Incomplete  CULTURE, BLOOD (ROUTINE X 2)     Status: None   Collection Time    09/06/12  3:12 PM      Result Value Range Status   Specimen Description BLOOD LEFT ARM   Final   Special Requests BOTTLES DRAWN AEROBIC AND ANAEROBIC    Final   Culture  Setup Time 09/07/2012 00:10   Final   Culture     Final   Value:        BLOOD CULTURE RECEIVED NO GROWTH TO DATE CULTURE WILL BE HELD FOR 5  DAYS BEFORE ISSUING A FINAL NEGATIVE REPORT   Report Status PENDING   Incomplete  WOUND CULTURE     Status: None   Collection Time    09/06/12  5:56 PM      Result Value Range Status   Specimen Description FOOT LEFT   Final   Special Requests NONE   Final  Gram Stain     Final   Value: RARE WBC PRESENT,BOTH PMN AND MONONUCLEAR     NO SQUAMOUS EPITHELIAL CELLS SEEN     NO ORGANISMS SEEN   Culture Culture reincubated for better growth   Final   Report Status PENDING   Incomplete  ANAEROBIC CULTURE     Status: None   Collection Time    09/06/12  5:56 PM      Result Value Range Status   Specimen Description FOOT LEFT   Final   Special Requests NONE   Final   Gram Stain     Final   Value: RARE WBC PRESENT,BOTH PMN AND MONONUCLEAR     NO SQUAMOUS EPITHELIAL CELLS SEEN     NO ORGANISMS SEEN   Culture     Final   Value: NO ANAEROBES ISOLATED; CULTURE IN PROGRESS FOR 5 DAYS   Report Status PENDING   Incomplete     Labs: Basic Metabolic Panel:  Recent Labs Lab 09/04/12 0512 09/05/12 0520 09/06/12 0450 09/07/12 0559 09/09/12 0500  NA 125* 126* 128* 129* 133*  K 3.8 3.6 3.3* 4.0 3.9  CL 92* 94* 92* 98 99  CO2 23 25 24 20 25   GLUCOSE 330* 315* 234* 285* 165*  BUN 8 8 7 7 7   CREATININE 0.63 0.62 0.58 0.51 0.53  CALCIUM 8.5 8.8 8.7 8.8 8.7   CBC:  Recent Labs Lab 09/02/12 2220  09/04/12 0512 09/05/12 0520 09/06/12 0450 09/07/12 0559 09/09/12 0500  WBC 10.4  < > 11.2* 9.1 7.5 8.1 7.4  NEUTROABS 7.6  --   --   --   --   --   --   HGB 13.6  < > 11.8* 11.0* 10.8* 10.6* 9.8*  HCT 37.8  < > 34.1* 32.7* 32.1* 30.7* 29.4*  MCV 83.3  < > 84.6 85.4 85.8 84.6 87.5  PLT 230  < > 221 249 258 PLATELET CLUMPS NOTED ON SMEAR, COUNT APPEARS ADEQUATE 274  < > = values in this interval not displayed.  CBG:  Recent Labs Lab 09/08/12 1139 09/08/12 1652 09/08/12 2207 09/09/12 0815 09/09/12 1149  GLUCAP 140* 143* 173* 163* 143*     IMAGING STUDIES Ct Foot Left W  Contrast  09/04/2012  *RADIOLOGY REPORT*  Clinical Data: Diabetic with left foot infection.  Question abscess.  History of partial amputation.  CT OF THE LEFT FOOT WITH CONTRAST  Technique:  Multidetector CT imaging was performed following the standard protocol during bolus administration of intravenous contrast.  Contrast: OMNIPAQUE IOHEXOL 300 MG/ML  SOLN  Comparison: Radiographs 11/10/2011 and 09/02/2012.  CT 09/05/2011.  Findings: There are new extensive inflammatory changes within the remaining lateral forefoot.  There is a focal abscess adjacent to the remnant of the fifth metatarsal.  This measures 1.4 x 2.2 x 1.4 cm and shows peripheral enhancement.  Peripheral to this well- circumscribed fluid collection is irregular enhancing soft tissue, likely a phlegmon. A small amount of peripherally enhancing fluid appears to extend along the plantar aspect of the Lisfranc joint. No soft tissue emphysema or foreign body is identified.  The remnants of the fourth and fifth metatarsals appear stable status post partial amputation.  There is no evidence of progressive osteomyelitis.  Midfoot degenerative changes are stable.  There is diffuse muscular atrophy throughout the foot.  IMPRESSION:  1.  Recurrent soft tissue infection laterally in the foot with focal abscess adjacent to the remnant of the fifth metatarsal. There is probable extension of  infection along the plantar aspect of the Lisfranc joint. 2.  No evidence of recurrent osteomyelitis.   Original Report Authenticated By: Carey Bullocks, M.D.    Dg Chest Port 1 View  09/06/2012  *RADIOLOGY REPORT*  Clinical Data: Evaluation of line placement.  PORTABLE CHEST - 1 VIEW  Comparison: 09/30/2008.  Findings: Tip of right sided venous catheter terminates in the right lower portion of superior vena cava at cavoatrial junction. No pneumothorax is evident.  No pulmonary infiltrates are evident. No pleural abnormality is evident.  Cardiac silhouette is  borderline size accentuated by the AP magnification.  IMPRESSION: Tip of right sided venous catheter terminates in the right lower portion of superior vena cava at cavoatrial junction.  No pneumothorax is evident.   Original Report Authenticated By: Onalee Hua Call    Dg Foot Complete Left  09/02/2012  *RADIOLOGY REPORT*  Clinical Data: Diabetes.  Swelling.  Pain.  Wound infection.  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  Extensive prominent overlying and plantar soft tissue swelling noted with a small amount of suspected gas in the soft tissues. Chronic fragmented spurring along the medial base of the remaining fourth metatarsal observed.  The there is some progressive erosion of the distal margin of the fifth metatarsal which could reflect osteomyelitis.  Dorsal midfoot spurring observed. Subcutaneous edema noted dorsally in the foot and ankle.  IMPRESSION:  1. Extensive and increased soft tissue swelling adjacent to the fourth and fifth metatarsals, with some progressive erosion of the distal fifth metatarsal raising the possibility of osteomyelitis, and a small amount of gas along the irregular soft tissue margins favoring ulceration. 2.  Subcutaneous edema noted dorsally in the foot and ankle.   Original Report Authenticated By: Gaylyn Rong, M.D.     DISCHARGE EXAMINATION: See Progress note from earlier today  DISPOSITION: Home with home health  Discharge Orders   Future Orders Complete By Expires     Diet Carb Modified  As directed     Discharge instructions  As directed     Comments:      Please follow up with the providers at the clinic you were given information about for management of diabetes. You will also need follow up with the Infectious disease specialist and with Dr. Ophelia Charter.    Increase activity slowly  As directed       Current Discharge Medication List     START taking these medications   Details  acetaminophen (TYLENOL) 325 MG tablet Take 2 tablets (650 mg total) by mouth every 6 (six) hours as needed for pain. Qty: 60 tablet, Refills: 0    Blood Glucose Monitoring Suppl (BLOOD GLUCOSE METER) kit Use as instructed Qty: 1 each, Refills: 0    ceFAZolin (ANCEF) 2-3 GM-% SOLR Inject 50 mLs (2 g total) into the vein every 8 (eight) hours. For 4 weeks. Till 10/06/12. Qty: 168 each, Refills: 0    docusate sodium 100 MG CAPS Take 100 mg by mouth 2 (two) times daily. Qty: 30 capsule, Refills: 0    insulin aspart protamine-insulin aspart (NOVOLOG 70/30) (70-30) 100 UNIT/ML injection Inject 30 Units into the skin 2 (two) times daily with a meal. Qty: 10 mL, Refills: 3    INSULIN SYRINGE .5CC/28G 28G X 1/2" 0.5 ML MISC Use as instructed Qty: 60 each, Refills: 2    methocarbamol (ROBAXIN) 500 MG tablet Take 1 tablet (  500 mg total) by mouth every 8 (eight) hours as needed. Qty: 30 tablet, Refills: 0    oxyCODONE (OXY IR/ROXICODONE) 5 MG immediate release tablet Take 1 tablet (5 mg total) by mouth every 4 (four) hours as needed for pain. Qty: 20 tablet, Refills: 0      CONTINUE these medications which have NOT CHANGED   Details  ibuprofen (ADVIL,MOTRIN) 200 MG tablet Take 800 mg by mouth every 6 (six) hours as needed. pain       Follow-up Information   Follow up with Eldred Manges, MD. Schedule an appointment as soon as possible for a visit in 2 weeks.   Contact information:   [address] [place] [address] [phone]       Follow up with Judyann Munson, MD. Schedule an appointment as soon as possible for a visit in 2 weeks.   Contact information:   [address] [place] [address] [phone]       TOTAL DISCHARGE TIME: 35 mins  [facility]  [facility] Pager [phone]  09/09/2012, 1:46 PM

## 2012-09-09 NOTE — Progress Notes (Signed)
PICC line capped off, flushed with 10cc NS, GBR, followed by Heparin 100u/ml (5ml). Consuello Masse

## 2012-09-11 LAB — WOUND CULTURE

## 2012-09-11 LAB — ANAEROBIC CULTURE

## 2012-09-13 LAB — CULTURE, BLOOD (ROUTINE X 2): Culture: NO GROWTH

## 2012-09-18 ENCOUNTER — Telehealth: Payer: Self-pay | Admitting: Infectious Disease

## 2012-09-18 ENCOUNTER — Telehealth: Payer: Self-pay | Admitting: Licensed Clinical Social Worker

## 2012-09-18 ENCOUNTER — Inpatient Hospital Stay: Payer: Medicaid Other | Admitting: Infectious Disease

## 2012-09-18 NOTE — Telephone Encounter (Signed)
This patient is more than an hour late. Is she going to come today or reschedule? She has had a bloodstream infeciton with MSSA and needs to be seen in this clinic

## 2012-09-18 NOTE — Telephone Encounter (Signed)
I called the patient to ask her to reschedule her appointment, Dr. Daiva Eves wants her to follow up with [place] because she has a picc line.

## 2012-10-14 ENCOUNTER — Inpatient Hospital Stay: Payer: Self-pay | Admitting: Infectious Disease

## 2013-04-30 ENCOUNTER — Emergency Department (HOSPITAL_COMMUNITY): Payer: Medicaid Other

## 2013-04-30 ENCOUNTER — Inpatient Hospital Stay (HOSPITAL_COMMUNITY)
Admission: EM | Admit: 2013-04-30 | Discharge: 2013-05-12 | DRG: 629 | Disposition: A | Discharge status: Skilled Nursing Facility | Payer: Medicaid Other | Attending: Internal Medicine | Admitting: Internal Medicine

## 2013-04-30 ENCOUNTER — Encounter (HOSPITAL_COMMUNITY): Payer: Self-pay | Admitting: Emergency Medicine

## 2013-04-30 DIAGNOSIS — I96 Gangrene, not elsewhere classified: Secondary | ICD-10-CM | POA: Diagnosis present

## 2013-04-30 DIAGNOSIS — M009 Pyogenic arthritis, unspecified: Secondary | ICD-10-CM | POA: Diagnosis present

## 2013-04-30 DIAGNOSIS — Z86711 Personal history of pulmonary embolism: Secondary | ICD-10-CM

## 2013-04-30 DIAGNOSIS — Z6841 Body Mass Index (BMI) 40.0 and over, adult: Secondary | ICD-10-CM

## 2013-04-30 DIAGNOSIS — Z91199 Patient's noncompliance with other medical treatment and regimen due to unspecified reason: Secondary | ICD-10-CM

## 2013-04-30 DIAGNOSIS — E1129 Type 2 diabetes mellitus with other diabetic kidney complication: Secondary | ICD-10-CM | POA: Diagnosis present

## 2013-04-30 DIAGNOSIS — M9080 Osteopathy in diseases classified elsewhere, unspecified site: Secondary | ICD-10-CM | POA: Diagnosis present

## 2013-04-30 DIAGNOSIS — D72829 Elevated white blood cell count, unspecified: Secondary | ICD-10-CM

## 2013-04-30 DIAGNOSIS — A491 Streptococcal infection, unspecified site: Secondary | ICD-10-CM

## 2013-04-30 DIAGNOSIS — Z9119 Patient's noncompliance with other medical treatment and regimen: Secondary | ICD-10-CM

## 2013-04-30 DIAGNOSIS — E1165 Type 2 diabetes mellitus with hyperglycemia: Principal | ICD-10-CM | POA: Diagnosis present

## 2013-04-30 DIAGNOSIS — L02419 Cutaneous abscess of limb, unspecified: Secondary | ICD-10-CM | POA: Diagnosis present

## 2013-04-30 DIAGNOSIS — N189 Chronic kidney disease, unspecified: Secondary | ICD-10-CM | POA: Diagnosis present

## 2013-04-30 DIAGNOSIS — M86179 Other acute osteomyelitis, unspecified ankle and foot: Secondary | ICD-10-CM | POA: Diagnosis present

## 2013-04-30 DIAGNOSIS — A4901 Methicillin susceptible Staphylococcus aureus infection, unspecified site: Secondary | ICD-10-CM

## 2013-04-30 DIAGNOSIS — G546 Phantom limb syndrome with pain: Secondary | ICD-10-CM | POA: Diagnosis present

## 2013-04-30 DIAGNOSIS — E785 Hyperlipidemia, unspecified: Secondary | ICD-10-CM | POA: Diagnosis present

## 2013-04-30 DIAGNOSIS — L03116 Cellulitis of left lower limb: Secondary | ICD-10-CM

## 2013-04-30 DIAGNOSIS — Z794 Long term (current) use of insulin: Secondary | ICD-10-CM

## 2013-04-30 DIAGNOSIS — IMO0002 Reserved for concepts with insufficient information to code with codable children: Principal | ICD-10-CM | POA: Diagnosis present

## 2013-04-30 DIAGNOSIS — E1149 Type 2 diabetes mellitus with other diabetic neurological complication: Secondary | ICD-10-CM | POA: Diagnosis present

## 2013-04-30 DIAGNOSIS — G547 Phantom limb syndrome without pain: Secondary | ICD-10-CM | POA: Diagnosis present

## 2013-04-30 DIAGNOSIS — R7881 Bacteremia: Secondary | ICD-10-CM

## 2013-04-30 DIAGNOSIS — E876 Hypokalemia: Secondary | ICD-10-CM | POA: Diagnosis not present

## 2013-04-30 DIAGNOSIS — D649 Anemia, unspecified: Secondary | ICD-10-CM | POA: Diagnosis present

## 2013-04-30 DIAGNOSIS — N179 Acute kidney failure, unspecified: Secondary | ICD-10-CM | POA: Diagnosis present

## 2013-04-30 DIAGNOSIS — E1169 Type 2 diabetes mellitus with other specified complication: Secondary | ICD-10-CM | POA: Diagnosis present

## 2013-04-30 DIAGNOSIS — N058 Unspecified nephritic syndrome with other morphologic changes: Secondary | ICD-10-CM | POA: Diagnosis present

## 2013-04-30 DIAGNOSIS — B951 Streptococcus, group B, as the cause of diseases classified elsewhere: Secondary | ICD-10-CM | POA: Diagnosis present

## 2013-04-30 DIAGNOSIS — I2699 Other pulmonary embolism without acute cor pulmonale: Secondary | ICD-10-CM

## 2013-04-30 DIAGNOSIS — E1142 Type 2 diabetes mellitus with diabetic polyneuropathy: Secondary | ICD-10-CM | POA: Diagnosis present

## 2013-04-30 DIAGNOSIS — N289 Disorder of kidney and ureter, unspecified: Secondary | ICD-10-CM | POA: Diagnosis present

## 2013-04-30 DIAGNOSIS — Z833 Family history of diabetes mellitus: Secondary | ICD-10-CM

## 2013-04-30 DIAGNOSIS — M869 Osteomyelitis, unspecified: Secondary | ICD-10-CM | POA: Diagnosis present

## 2013-04-30 DIAGNOSIS — E11621 Type 2 diabetes mellitus with foot ulcer: Secondary | ICD-10-CM | POA: Diagnosis present

## 2013-04-30 DIAGNOSIS — L02619 Cutaneous abscess of unspecified foot: Secondary | ICD-10-CM | POA: Diagnosis present

## 2013-04-30 DIAGNOSIS — N184 Chronic kidney disease, stage 4 (severe): Secondary | ICD-10-CM | POA: Diagnosis present

## 2013-04-30 DIAGNOSIS — E871 Hypo-osmolality and hyponatremia: Secondary | ICD-10-CM | POA: Diagnosis present

## 2013-04-30 DIAGNOSIS — L0390 Cellulitis, unspecified: Secondary | ICD-10-CM

## 2013-04-30 DIAGNOSIS — E119 Type 2 diabetes mellitus without complications: Secondary | ICD-10-CM | POA: Diagnosis present

## 2013-04-30 DIAGNOSIS — S98139A Complete traumatic amputation of one unspecified lesser toe, initial encounter: Secondary | ICD-10-CM

## 2013-04-30 DIAGNOSIS — N1831 Chronic kidney disease, stage 3a: Secondary | ICD-10-CM | POA: Diagnosis present

## 2013-04-30 DIAGNOSIS — Z8249 Family history of ischemic heart disease and other diseases of the circulatory system: Secondary | ICD-10-CM

## 2013-04-30 DIAGNOSIS — L97509 Non-pressure chronic ulcer of other part of unspecified foot with unspecified severity: Secondary | ICD-10-CM | POA: Diagnosis present

## 2013-04-30 LAB — CBC WITH DIFFERENTIAL/PLATELET
Basophils Absolute: 0 10*3/uL (ref 0.0–0.1)
Basophils Relative: 0 % (ref 0–1)
Eosinophils Absolute: 0 10*3/uL (ref 0.0–0.7)
Eosinophils Relative: 0 % (ref 0–5)
HCT: 38 % (ref 36.0–46.0)
Hemoglobin: 13.4 g/dL (ref 12.0–15.0)
Lymphocytes Relative: 14 % (ref 12–46)
Lymphs Abs: 1.1 10*3/uL (ref 0.7–4.0)
MCH: 29.5 pg (ref 26.0–34.0)
MCHC: 35.3 g/dL (ref 30.0–36.0)
MCV: 83.7 fL (ref 78.0–100.0)
Monocytes Absolute: 0.6 10*3/uL (ref 0.1–1.0)
Monocytes Relative: 8 % (ref 3–12)
Neutro Abs: 6 10*3/uL (ref 1.7–7.7)
Neutrophils Relative %: 78 % — ABNORMAL HIGH (ref 43–77)
Platelets: 149 10*3/uL — ABNORMAL LOW (ref 150–400)
RBC: 4.54 MIL/uL (ref 3.87–5.11)
RDW: 12.3 % (ref 11.5–15.5)
WBC: 7.7 10*3/uL (ref 4.0–10.5)

## 2013-04-30 LAB — BASIC METABOLIC PANEL
BUN: 12 mg/dL (ref 6–23)
CO2: 20 mEq/L (ref 19–32)
Calcium: 9.2 mg/dL (ref 8.4–10.5)
Chloride: 88 mEq/L — ABNORMAL LOW (ref 96–112)
Creatinine, Ser: 0.63 mg/dL (ref 0.50–1.10)
GFR calc Af Amer: >90 mL/min (ref >90)
GFR calc non Af Amer: >90 mL/min (ref >90)
Glucose, Bld: 411 mg/dL — ABNORMAL HIGH (ref 70–99)
Potassium: 3.4 mEq/L — ABNORMAL LOW (ref 3.5–5.1)
Sodium: 124 mEq/L — ABNORMAL LOW (ref 135–145)

## 2013-04-30 LAB — GLUCOSE, CAPILLARY: Glucose-Capillary: 388 mg/dL — ABNORMAL HIGH (ref 70–99)

## 2013-04-30 MED ORDER — PROMETHAZINE HCL 25 MG/ML IJ SOLN
12.5000 mg | Freq: Four times a day (QID) | INTRAMUSCULAR | Status: DC | PRN
  Administered 2013-04-30 – 2013-05-07 (×13): 25 mg via INTRAVENOUS
  Administered 2013-05-09: 12.5 mg via INTRAVENOUS
  Administered 2013-05-11: 25 mg via INTRAVENOUS
  Filled 2013-04-30 (×18): qty 1

## 2013-04-30 MED ORDER — INSULIN ASPART PROT & ASPART (70-30 MIX) 100 UNIT/ML ~~LOC~~ SUSP: 6.0000 [IU] | Freq: Once | SUBCUTANEOUS | Status: DC

## 2013-04-30 MED ORDER — SODIUM CHLORIDE 0.9 % IV SOLN
INTRAVENOUS | Status: AC
  Administered 2013-04-30: 22:00:00 via INTRAVENOUS

## 2013-04-30 MED ORDER — PIPERACILLIN-TAZOBACTAM 3.375 G IVPB 30 MIN
3.3750 g | Freq: Once | INTRAVENOUS | Status: AC
  Administered 2013-04-30: 3.375 g via INTRAVENOUS
  Filled 2013-04-30 (×2): qty 50

## 2013-04-30 MED ORDER — PIPERACILLIN-TAZOBACTAM 3.375 G IVPB
3.3750 g | Freq: Three times a day (TID) | INTRAVENOUS | Status: DC
  Administered 2013-05-01 – 2013-05-06 (×17): 3.375 g via INTRAVENOUS
  Filled 2013-04-30 (×18): qty 50

## 2013-04-30 MED ORDER — HYDROMORPHONE HCL PF 1 MG/ML IJ SOLN
1.0000 mg | Freq: Once | INTRAMUSCULAR | Status: AC
  Administered 2013-04-30: 1 mg via INTRAVENOUS
  Filled 2013-04-30: qty 1

## 2013-04-30 MED ORDER — INSULIN ASPART 100 UNIT/ML ~~LOC~~ SOLN
6.0000 [IU] | Freq: Once | SUBCUTANEOUS | Status: AC
  Administered 2013-04-30: 6 [IU] via INTRAVENOUS
  Filled 2013-04-30: qty 1

## 2013-04-30 MED ORDER — HYDROMORPHONE HCL PF 1 MG/ML IJ SOLN
1.0000 mg | INTRAMUSCULAR | Status: DC | PRN
  Administered 2013-04-30 – 2013-05-12 (×88): 1 mg via INTRAVENOUS
  Filled 2013-04-30 (×90): qty 1

## 2013-04-30 MED ORDER — CHLORHEXIDINE GLUCONATE 4 % EX LIQD
60.0000 mL | Freq: Once | CUTANEOUS | Status: DC
  Filled 2013-04-30 (×2): qty 60

## 2013-04-30 MED ORDER — SODIUM CHLORIDE 0.9 % IV SOLN
Freq: Once | INTRAVENOUS | Status: AC
  Administered 2013-04-30: 20:00:00 via INTRAVENOUS

## 2013-04-30 MED ORDER — VANCOMYCIN HCL 10 G IV SOLR
1500.0000 mg | Freq: Two times a day (BID) | INTRAVENOUS | Status: DC
  Administered 2013-05-01 – 2013-05-02 (×4): 1500 mg via INTRAVENOUS
  Filled 2013-04-30 (×6): qty 1500

## 2013-04-30 MED ORDER — LIDOCAINE HCL 2 % IJ SOLN
INTRAMUSCULAR | Status: AC
  Administered 2013-04-30: 23:00:00
  Filled 2013-04-30: qty 20

## 2013-04-30 MED ORDER — INSULIN ASPART 100 UNIT/ML ~~LOC~~ SOLN
0.0000 [IU] | SUBCUTANEOUS | Status: DC
  Administered 2013-04-30: 7 [IU] via SUBCUTANEOUS
  Administered 2013-05-01 (×3): 5 [IU] via SUBCUTANEOUS
  Administered 2013-05-01: 7 [IU] via SUBCUTANEOUS
  Administered 2013-05-01: 9 [IU] via SUBCUTANEOUS

## 2013-04-30 MED ORDER — PROMETHAZINE HCL 25 MG/ML IJ SOLN
12.5000 mg | Freq: Once | INTRAMUSCULAR | Status: AC
  Administered 2013-04-30: 12.5 mg via INTRAVENOUS
  Filled 2013-04-30: qty 1

## 2013-04-30 MED ORDER — ONDANSETRON HCL 4 MG/2ML IJ SOLN: 4.0000 mg | Freq: Four times a day (QID) | INTRAMUSCULAR | Status: DC | PRN

## 2013-04-30 MED ORDER — PIPERACILLIN-TAZOBACTAM 4.5 G IVPB
4.5000 g | Freq: Once | INTRAVENOUS | Status: DC
  Filled 2013-04-30: qty 100

## 2013-04-30 MED ORDER — LIDOCAINE HCL (PF) 2 % IJ SOLN
20.0000 mL | Freq: Once | INTRAMUSCULAR | Status: AC
  Administered 2013-04-30: 20 mL
  Filled 2013-04-30: qty 20

## 2013-04-30 MED ORDER — ONDANSETRON HCL 4 MG PO TABS: 4.0000 mg | ORAL_TABLET | Freq: Four times a day (QID) | ORAL | Status: DC | PRN

## 2013-04-30 MED ORDER — VANCOMYCIN HCL 10 G IV SOLR
2500.0000 mg | Freq: Once | INTRAVENOUS | Status: AC
  Administered 2013-04-30: 2500 mg via INTRAVENOUS
  Filled 2013-04-30: qty 2500

## 2013-04-30 NOTE — H&P (Signed)
Chief Complaint:  Foot pain swelling and redness  HPI: 37 yo female diabetic not taking any meds due to lack of health insurance comes in with several days of worsening swelling and redness to left foot.  Pt s/p toe amputation earlier this year with dr Karen Bennett, reports she had a chronic healing wound there.  Started with more pain in foot called dr Karen Bennett mon who prescribed keflex.  Over the last 24 hours or so the dorsum of the left foot has developed a large swelling with protrusion of some tissue thru wound with foul smelling drainage, and surrounding redness now extending up to ankle area.  No fevers.  No n/v/d.    Review of Systems:  Positive and negative as per HPI otherwise all other systems are negative  Past Medical History: Past Medical History  Diagnosis Date  . Cellulitis   . Diabetes mellitus   . PE (pulmonary embolism) ~2007-2008    Not on anticoagulation   Past Surgical History  Procedure Laterality Date  . Toe amputation      last 2 on L foot  . Surgery to remove hematoma in l leg    . Abscess removal from l groin    . I&d extremity  11/12/2011    Procedure: IRRIGATION AND DEBRIDEMENT EXTREMITY;  Surgeon: Karen Hitch, MD;  Location: [facility] ORS;  Service: Orthopedics;  Laterality: Left;  foot left  . I&d extremity Left 09/06/2012    Procedure: IRRIGATION AND DEBRIDEMENT EXTREMITY;  Surgeon: Karen Manges, MD;  Location: [facility] OR[facility];  Service: Orthopedics;  Laterality: Left;    Medications: Prior to Admission medications   Medication Sig Start Date End Date Taking? Authorizing Provider  cephALEXin (KEFLEX) 500 MG capsule Take 500 mg by mouth 2 (two) times daily.   Yes Historical Provider, MD  ibuprofen (ADVIL,MOTRIN) 200 MG tablet Take 800 mg by mouth every 6 (six) hours as needed. pain   Yes Historical Provider, MD    Allergies:   Allergies  Allergen Reactions  . Zofran Itching and Nausea And Vomiting  . Doxycycline Nausea And Vomiting  . Morphine And Related  Itching    Social History:  reports that she has never smoked. She has never used smokeless tobacco. She reports that she does not drink alcohol or use illicit drugs.  Family History: Family History  Problem Relation Age of Onset  . Diabetes Mother   . Coronary artery disease Mother     Physical Exam: Filed Vitals:   04/30/13 1641 04/30/13 1856  BP: 183/94   Pulse: 117   Temp: 98.1 F (36.7 C)   TempSrc: Oral   Resp: 20   Height:  5' 8.11" (1.73 m)  Weight:  163.295 kg (360 lb)  SpO2: 95%    General appearance: alert, cooperative and no distress Head: Normocephalic, without obvious abnormality, atraumatic Eyes: negative Nose: Nares normal. Septum midline. Mucosa normal. No drainage or sinus tenderness. Neck: no JVD and supple, symmetrical, trachea midline Lungs: clear to auscultation bilaterally Heart: regular rate and rhythm, S1, S2 normal, no murmur, click, rub or gallop Abdomen: soft, non-tender; bowel sounds normal; no masses,  no organomegaly Extremities: left dorsum with large swelling under wound foul smelling with cellulits up to ankle Pulses: 2+ and symmetric Skin: Skin color, texture, turgor normal. No rashes or lesions except above Neurologic: Grossly normal    Labs on Admission:   Recent Labs  04/30/13 1710  NA 124*  K 3.4*  CL 88*  CO2 20  GLUCOSE 411*  BUN 12  CREATININE 0.63  CALCIUM 9.2    Recent Labs  04/30/13 1710  WBC 7.7  NEUTROABS 6.0  HGB 13.4  HCT 38.0  MCV 83.7  PLT 149*    Radiological Exams on Admission: Dg Foot Complete Left  04/30/2013   CLINICAL DATA:  Nonhealing wound  EXAM: LEFT FOOT - COMPLETE 3+ VIEW  COMPARISON:  09/02/2012  FINDINGS: The 4th and 5th metatarsals and digits have been amputated. The base of the 4th and 5th metatarsals remain intact, with diffuse sclerosis. There has been increasing erosion at the tips of these bones since the prior study. . There is also slight widening between the base of the 2nd  metatarsal and medial cuneiform. Marked soft tissue swelling at the amputation site is noted.  IMPRESSION: There is soft tissue swelling about the 4th and 5th metatarsal amputation site. Slightly increased erosion of the distal aspect of the remainder of the bone may represent developing osteomyelitis.   Electronically Signed   By: Karen Bennett M.D.   On: 04/30/2013 18:06    Assessment/Plan  37 yo female with lle cellulitis/absess/osteo Principal Problem:   Diabetic foot ulcer Active Problems:   DM (diabetes mellitus), type 2, uncontrolled with complications   Obesity, Class III, BMI 40-49.9 (morbid obesity)   Hyponatremia   Osteomyelitis  Place on iv vanc/zosyn.  Called Ortho on call will see pt tonight for possible i&d tonight, keep npo, hold anticoagulatiion.  Ssi.  Admit to med.  Full code.    Karen Bennett A 04/30/2013, 7:38 PM

## 2013-04-30 NOTE — Progress Notes (Signed)
Patient confirms she does not have a pcp or insurance.  Patient reports she has tried fro [place] but keeps getting denied.  [facility] provided patient with list of pcps who accept self pay patients, list of discount pharmacies and website needymeds.org for medication assitance, list of financial resources in the community such as local churches and [employer], crisis intervention resources, and information on [employer] and [employer] Act for insurance.  Patient also open to receiving information on the orange card.  EDCM will ask [facility] representative to send patient information on the orange card in the mail.  Patient thankful for resources.  Patient placed resources in her purse.  No further needs at this time.

## 2013-04-30 NOTE — Progress Notes (Addendum)
ANTIBIOTIC CONSULT NOTE - INITIAL  Pharmacy Consult for vancomycin, Zosyn Indication: osteomyelitis  Allergies  Allergen Reactions  . Zofran Itching and Nausea And Vomiting  . Doxycycline Nausea And Vomiting  . Morphine And Related Itching    Patient Measurements: Height: 5' 8.11" (173 cm) Weight: 360 lb (163.295 kg) IBW/kg (Calculated) : 64.15   Vital Signs: Temp: 98.1 F (36.7 C) (10/08 1641) Temp src: Oral (10/08 1641) BP: 183/94 mmHg (10/08 1641) Pulse Rate: 117 (10/08 1641)  Labs:  Recent Labs  04/30/13 1710  WBC 7.7  HGB 13.4  PLT 149*  CREATININE 0.63   Estimated Creatinine Clearance: 160.8 ml/min (by C-G formula based on Cr of 0.63).  Microbiology: No results found for this or any previous visit (from the past 720 hour(s)).  Medical History: Past Medical History  Diagnosis Date  . Cellulitis   . Diabetes mellitus   . PE (pulmonary embolism) ~2007-2008    Not on anticoagulation    Assessment: 85 yoF, PMH significant for uncontrolled diabetes, MSSA bacteremia, and polymicrobial foot infections, admitted 10/8 with suspected osteomyelitis of the L toes. Pt has had an amputation of the 4th and 5th toes on the left foot in 2010. Pt states she has been taking cephalexin 500mg  BID per ortho MD for same issue started Monday 10/6.  Pharmacy has been consulted to dose vancomycin for osteomyelitis.  Noted patient has a one-time order for Zosyn to be administered in the ED  Patient is afebrile  WBC nml, 78% neutrophils  SCr 0.63  Xray of the L foot shows soft tissue swelling about the 4th and 5th metatarsal amputation site and slightly increased erosion of the distal aspect of the remainder of the bone may represent developing osteomyelitis  Weight documented as 163kg  No cultures have been drawn  Goal of Therapy:  Vancomycin trough level 15-20 mcg/ml eradication of infection  Plan:  - vancomycin 2500mg  IV x 1 as a loading dose - vancomycin 1500mg   IV q12h based on obesity nomogram starting at 0800 on 10/9 - vancomycin trough at steady state if indicated - follow-up clinical course, renal function  Thank you for the consult.  Tomi Bamberger, PharmD Clinical Pharmacist Pager: [phone] Pharmacy: [phone] 04/30/2013 7:55 PM   Addendum: Pharmacy now consulted to also dose Zosyn.  - Zosyn 3.375g IV q8, each dose infused over 4 hours  Tomi Bamberger, PharmD Clinical Pharmacist Pager: [phone] Pharmacy: [phone] 04/30/2013 10:14 PM

## 2013-04-30 NOTE — Consult Note (Signed)
Reason for Consult:Left foot swelling, redness and pain. Referring Physician:Rachal Onalee Hua, MD Consulting Physician:NITKA,JAMES E MD  Orthopedic Diagnosis:Left Foot lateral and dorsal abscess, localized area of gangrenous ulcer plantar lateral left  Foot status post left lateral foot 4th and 5th metatarsal level ray amputations.  NWG:NFAOZ Dawn Swickard is an 37 y.o. female with history of diabetes mellitus, underwent left foot 4th and 5th ray amputations by Dr.Marcus Lajoyce Corners 2010. In 10/2011 Dr. Doneen Poisson MD performed a repeat I and D of the Left foot. She has been extremely noncompliant with any followup with orthopaedics and Dr. Annell Greening was on Call at the last ER presentation for her left foot in 08/2012 and preformed an I and D of a left foot lateral abscess. There are no office notes post op and it appears as though she did not return for follow up with Dr. Ophelia Charter after that Procedure. There are no records of Dr. Ophelia Charter being contacted about increasing problems with the left foot this Week. [facility] indicates that she called Dr. Ophelia Charter and a prescription for Keflex was called in for her. She presents With a two week history of increasing left foot swelling erythrema and pain. Had noticed intermittant chills, no fever. Seen in [facility] with normal WBC  But definite left foot swelling erythrema and warmth consistent with a left  Foot lateral abscess. Admitting MD describing as a grapefruit size swelling of the left lateral foot. xrays are concerning For sclerotic changes of the distal resected ends of the 4th and 5th metatarsal amputation ends.   Past Medical History  Diagnosis Date  . Cellulitis   . Diabetes mellitus   . PE (pulmonary embolism) ~2007-2008    Not on anticoagulation    Past Surgical History  Procedure Laterality Date  . Toe amputation      last 2 on L foot  . Surgery to remove hematoma in l leg    . Abscess removal from l groin    . I&d extremity  11/12/2011   Procedure: IRRIGATION AND DEBRIDEMENT EXTREMITY;  Surgeon: Kathryne Hitch, MD;  Location: [facility];  Service: Orthopedics;  Laterality: Left;  foot left  . I&d extremity Left 09/06/2012    Procedure: IRRIGATION AND DEBRIDEMENT EXTREMITY;  Surgeon: Eldred Manges, MD;  Location: [facility];  Service: Orthopedics;  Laterality: Left;    Family History  Problem Relation Age of Onset  . Diabetes Mother   . Coronary artery disease Mother     Social History:  reports that she has never smoked. She has never used smokeless tobacco. She reports that she does not drink alcohol or use illicit drugs.  Allergies:  Allergies  Allergen Reactions  . Zofran Itching and Nausea And Vomiting  . Doxycycline Nausea And Vomiting  . Morphine And Related Itching    Medications:  Prior to Admission:  Prescriptions prior to admission  Medication Sig Dispense Refill  . cephALEXin (KEFLEX) 500 MG capsule Take 500 mg by mouth 2 (two) times daily.      Marland Kitchen ibuprofen (ADVIL,MOTRIN) 200 MG tablet Take 800 mg by mouth every 6 (six) hours as needed. pain       Scheduled: . insulin aspart  0-9 Units Subcutaneous Q4H  . lidocaine      . [START ON 05/01/2013] vancomycin  1,500 mg Intravenous Q12H  . vancomycin  2,500 mg Intravenous Once   Continuous: . sodium chloride      Results for orders placed during the hospital encounter of 04/30/13 (from the  past 48 hour(s))  CBC WITH DIFFERENTIAL     Status: Abnormal   Collection Time    04/30/13  5:10 PM      Result Value Range   WBC 7.7  4.0 - 10.5 K/uL   RBC 4.54  3.87 - 5.11 MIL/uL   Hemoglobin 13.4  12.0 - 15.0 g/dL   HCT 16.1  09.6 - 04.5 %   MCV 83.7  78.0 - 100.0 fL   MCH 29.5  26.0 - 34.0 pg   MCHC 35.3  30.0 - 36.0 g/dL   RDW 40.9  81.1 - 91.4 %   Platelets 149 (*) 150 - 400 K/uL   Neutrophils Relative % 78 (*) 43 - 77 %   Neutro Abs 6.0  1.7 - 7.7 K/uL   Lymphocytes Relative 14  12 - 46 %   Lymphs Abs 1.1  0.7 - 4.0 K/uL   Monocytes Relative 8  3 -  12 %   Monocytes Absolute 0.6  0.1 - 1.0 K/uL   Eosinophils Relative 0  0 - 5 %   Eosinophils Absolute 0.0  0.0 - 0.7 K/uL   Basophils Relative 0  0 - 1 %   Basophils Absolute 0.0  0.0 - 0.1 K/uL  BASIC METABOLIC PANEL     Status: Abnormal   Collection Time    04/30/13  5:10 PM      Result Value Range   Sodium 124 (*) 135 - 145 mEq/L   Potassium 3.4 (*) 3.5 - 5.1 mEq/L   Chloride 88 (*) 96 - 112 mEq/L   CO2 20  19 - 32 mEq/L   Glucose, Bld 411 (*) 70 - 99 mg/dL   BUN 12  6 - 23 mg/dL   Creatinine, Ser 7.82  0.50 - 1.10 mg/dL   Calcium 9.2  8.4 - 95.6 mg/dL   GFR calc non Af Amer >90  >90 mL/min   GFR calc Af Amer >90  >90 mL/min   Comment: (NOTE)     The eGFR has been calculated using the CKD EPI equation.     This calculation has not been validated in all clinical situations.     eGFR's persistently <90 mL/min signify possible Chronic Kidney     Disease.  GLUCOSE, CAPILLARY     Status: Abnormal   Collection Time    04/30/13  8:17 PM      Result Value Range   Glucose-Capillary 388 (*) 70 - 99 mg/dL    Dg Foot Complete Left  04/30/2013   CLINICAL DATA:  Nonhealing wound  EXAM: LEFT FOOT - COMPLETE 3+ VIEW  COMPARISON:  09/02/2012  FINDINGS: The 4th and 5th metatarsals and digits have been amputated. The base of the 4th and 5th metatarsals remain intact, with diffuse sclerosis. There has been increasing erosion at the tips of these bones since the prior study. . There is also slight widening between the base of the 2nd metatarsal and medial cuneiform. Marked soft tissue swelling at the amputation site is noted.  IMPRESSION: There is soft tissue swelling about the 4th and 5th metatarsal amputation site. Slightly increased erosion of the distal aspect of the remainder of the bone may represent developing osteomyelitis.   Electronically Signed   By: Maryclare Bean M.D.   On: 04/30/2013 18:06    ROS Blood pressure 139/75, pulse 100, temperature 98.5 F (36.9 C), temperature source Oral,  resp. rate 20, height 5' 8.11" (1.73 m), weight 170.7 kg (376 lb 5.2 oz),  last menstrual period 04/13/2013, SpO2 95.00%. Physical Exam  Orthopaedic Exam:37 year old obese female lying on hospital bed and able to sit up right, she is in minimal distress. Left foot is diffusely swollen with spread of the great toe 2nd toe interspace and both dorsal and plantar swelling. There Is a necrotic ulcer over the plantar distal lateral forefoot that is showing swelling of the ulcer bed with black and erythrematous changes and seropurulent drainage. An unsually large 8cm circumference subcutaneous area of swelling or abscess is Present underlying the ulcer and extending along the lateral dorsal and plantar aspects of the left forefoot. The 4th and 5th toes are absent from prior amputation. Mild erythrema over the dorsal and lateral forefoot. Swelling of the left foot to The level of the ankle.  Assessment/Plan: Left Foot lateral and dorsal abscess, localized area of gangrenous ulcer plantar lateral left  Foot status post left lateral foot 4th and 5th metatarsal level ray amputations. Extremely noncompliant patient with diabetes, her extremity is at risk of need for below knee amputation.  Plan:Her diabetes is terribly out of control and she is hyponatremic, risk for DKA. Will plan to I and D her left foot  Tonight at the bedside and drain the abscess to decrease risk of systemic sepsis. Will have her scheduled then to undergo a formal I and D of this foot in the OR tomorrow afternoon is she is medically Stable. Unfortunately she only has 3 remaining toes no further partial foot amputations appear to be viable in light of  her size she will break down a 2 toe foot due to poor area of foot to distribute her weight. That may be the problem Causing recurrent ulcer and breakdown at this time.Mid foot or syme amputation level will also likely fail. If the abscess Is extensive BKA may be necessary to control  infection and offer for a long term stable leg that can be fitted with a Prosthesis to allow return to ambulation. Unfortunately her noncompliance prevents much guarantee that future problems may not occur. Will send swab for culture and sensitivity and gram stain.  NITKA,JAMES E 04/30/2013, 9:55 PM

## 2013-04-30 NOTE — Progress Notes (Addendum)
Procedure Note Karen Bennett [id number] 1976/03/19  Procedure: Incision and drainage of left forefoot abscess Indications: Left forefoot abscess. Procedure Details Consent: Risks of procedure as well as the alternatives and risks of each were explained to the (patient/caregiver).  Consent for procedure obtained. Time Out: Verified patient identification, verified procedure, site/side was marked, verified correct patient position, special equipment/implants available, medications/allergies/relevent history reviewed, required imaging and test results available.  Performed   Local Anesthesia Used:Lidocaine 2% plain; 8mL 3cm incision over the plantar lateral left foot, subcut layers opened with suture scissors. NS soaked 4x4 packed to wick incision after obtaining culturette swab for C&S. 4x4 and ADBs wrapped with kerlix and 3inch ace wrap. Amount of Fluid Aspirated: drainage of greater than 50cc serosangineous material Character of Fluid: bloody and brown colored Fluid was sent [id number] A sterile dressing was applied.  Patient did tolerate procedure well. Estimated blood loss: 50cc Will plan for a formal Incision debridement and drainage of the left foot in the operating room tomorrow afternoon.  Maclovio Henson E 04/30/2013, 11:20 PM

## 2013-04-30 NOTE — ED Notes (Signed)
Pt has hx of diabetes, reports she cannot afford insulin. Has had multiple abscess in feet. Last two digits of left foot amputated in 2010. Pt has surgery on abscess in March 2014. Reports Thursday she noticed reddness, swelling, and drainage from left foot. Called ortho doctor Ophelia Charter) who called in abx cephelexin 500mg  BID x2 weeks. Pt started abx on Monday. At present foul odor, reddness, and drainage. Pain 10/10.

## 2013-04-30 NOTE — Progress Notes (Signed)
Utilization Review completed.  Dshawn Mcnay RN CM  

## 2013-05-01 ENCOUNTER — Encounter (HOSPITAL_COMMUNITY)
Admission: EM | Disposition: A | Discharge status: Skilled Nursing Facility | Payer: Self-pay | Source: Home / Self Care | Attending: Internal Medicine

## 2013-05-01 ENCOUNTER — Encounter (HOSPITAL_COMMUNITY): Payer: Medicaid Other | Admitting: Anesthesiology

## 2013-05-01 ENCOUNTER — Inpatient Hospital Stay (HOSPITAL_COMMUNITY): Payer: Medicaid Other | Admitting: Anesthesiology

## 2013-05-01 ENCOUNTER — Encounter (HOSPITAL_COMMUNITY): Payer: Self-pay | Admitting: Certified Registered Nurse Anesthetist

## 2013-05-01 DIAGNOSIS — E871 Hypo-osmolality and hyponatremia: Secondary | ICD-10-CM

## 2013-05-01 DIAGNOSIS — E1149 Type 2 diabetes mellitus with other diabetic neurological complication: Secondary | ICD-10-CM

## 2013-05-01 DIAGNOSIS — E1142 Type 2 diabetes mellitus with diabetic polyneuropathy: Secondary | ICD-10-CM

## 2013-05-01 DIAGNOSIS — E1165 Type 2 diabetes mellitus with hyperglycemia: Secondary | ICD-10-CM

## 2013-05-01 DIAGNOSIS — L97509 Non-pressure chronic ulcer of other part of unspecified foot with unspecified severity: Secondary | ICD-10-CM

## 2013-05-01 DIAGNOSIS — L02619 Cutaneous abscess of unspecified foot: Secondary | ICD-10-CM

## 2013-05-01 DIAGNOSIS — E876 Hypokalemia: Secondary | ICD-10-CM | POA: Diagnosis present

## 2013-05-01 DIAGNOSIS — E1169 Type 2 diabetes mellitus with other specified complication: Secondary | ICD-10-CM

## 2013-05-01 HISTORY — PX: I & D EXTREMITY: SHX5045

## 2013-05-01 HISTORY — PX: APPLICATION OF WOUND VAC: SHX5189

## 2013-05-01 LAB — GRAM STAIN
Gram Stain: NONE SEEN
Gram Stain: NONE SEEN

## 2013-05-01 LAB — CBC
Platelets: 147 10*3/uL — ABNORMAL LOW (ref 150–400)
RBC: 3.96 MIL/uL (ref 3.87–5.11)
RDW: 12.4 % (ref 11.5–15.5)
WBC: 5.6 10*3/uL (ref 4.0–10.5)

## 2013-05-01 LAB — GLUCOSE, CAPILLARY
Glucose-Capillary: 272 mg/dL — ABNORMAL HIGH (ref 70–99)
Glucose-Capillary: 335 mg/dL — ABNORMAL HIGH (ref 70–99)
Glucose-Capillary: 337 mg/dL — ABNORMAL HIGH (ref 70–99)
Glucose-Capillary: 350 mg/dL — ABNORMAL HIGH (ref 70–99)

## 2013-05-01 LAB — BASIC METABOLIC PANEL
Calcium: 8.7 mg/dL (ref 8.4–10.5)
Creatinine, Ser: 0.67 mg/dL (ref 0.50–1.10)
GFR calc Af Amer: >90 mL/min (ref >90)
GFR calc non Af Amer: >90 mL/min (ref >90)
Sodium: 128 mEq/L — ABNORMAL LOW (ref 135–145)

## 2013-05-01 LAB — PROTIME-INR
INR: 1.05 (ref 0.00–1.49)
Prothrombin Time: 13.5 seconds (ref 11.6–15.2)

## 2013-05-01 LAB — HEMOGLOBIN A1C
Hgb A1c MFr Bld: 13 % — ABNORMAL HIGH (ref <5.7)
Mean Plasma Glucose: 326 mg/dL — ABNORMAL HIGH (ref <117)

## 2013-05-01 LAB — C-REACTIVE PROTEIN: CRP: 33.9 mg/dL — ABNORMAL HIGH (ref <0.60)

## 2013-05-01 SURGERY — IRRIGATION AND DEBRIDEMENT EXTREMITY
Anesthesia: General | Site: Foot | Laterality: Left | Wound class: Dirty or Infected

## 2013-05-01 MED ORDER — CHLORHEXIDINE GLUCONATE 4 % EX LIQD
60.0000 mL | Freq: Once | CUTANEOUS | Status: DC
  Filled 2013-05-01: qty 60

## 2013-05-01 MED ORDER — KETAMINE HCL 10 MG/ML IJ SOLN
INTRAMUSCULAR | Status: DC | PRN
  Administered 2013-05-01: 20 mg via INTRAVENOUS

## 2013-05-01 MED ORDER — LACTATED RINGERS IV SOLN: INTRAVENOUS | Status: DC

## 2013-05-01 MED ORDER — LIDOCAINE HCL (CARDIAC) 20 MG/ML IV SOLN
INTRAVENOUS | Status: DC | PRN
  Administered 2013-05-01: 100 mg via INTRAVENOUS

## 2013-05-01 MED ORDER — SODIUM CHLORIDE 0.9 % IR SOLN
Status: DC | PRN
  Administered 2013-05-01: 1000 mL

## 2013-05-01 MED ORDER — PHENOL 1.4 % MT LIQD
1.0000 | OROMUCOSAL | Status: DC | PRN
  Administered 2013-05-01: 1 via OROMUCOSAL
  Filled 2013-05-01: qty 177

## 2013-05-01 MED ORDER — LACTATED RINGERS IV SOLN
INTRAVENOUS | Status: DC | PRN
  Administered 2013-05-01: 13:00:00 via INTRAVENOUS

## 2013-05-01 MED ORDER — FENTANYL CITRATE 0.05 MG/ML IJ SOLN
INTRAMUSCULAR | Status: DC | PRN
  Administered 2013-05-01: 25 ug via INTRAVENOUS
  Administered 2013-05-01: 50 ug via INTRAVENOUS
  Administered 2013-05-01: 25 ug via INTRAVENOUS
  Administered 2013-05-01 (×3): 50 ug via INTRAVENOUS

## 2013-05-01 MED ORDER — FERROUS SULFATE 325 (65 FE) MG PO TABS
325.0000 mg | ORAL_TABLET | Freq: Three times a day (TID) | ORAL | Status: DC
  Administered 2013-05-01 – 2013-05-12 (×32): 325 mg via ORAL
  Filled 2013-05-01 (×37): qty 1

## 2013-05-01 MED ORDER — PROPOFOL 10 MG/ML IV BOLUS
INTRAVENOUS | Status: DC | PRN
  Administered 2013-05-01: 300 mg via INTRAVENOUS

## 2013-05-01 MED ORDER — FENTANYL CITRATE 0.05 MG/ML IJ SOLN: 25.0000 ug | INTRAMUSCULAR | Status: DC | PRN

## 2013-05-01 MED ORDER — SODIUM CHLORIDE 0.9 % IR SOLN
Status: DC | PRN
  Administered 2013-05-01: 15:00:00

## 2013-05-01 MED ORDER — INSULIN ASPART 100 UNIT/ML ~~LOC~~ SOLN
SUBCUTANEOUS | Status: AC
  Filled 2013-05-01: qty 1

## 2013-05-01 MED ORDER — INSULIN ASPART 100 UNIT/ML ~~LOC~~ SOLN
0.0000 [IU] | SUBCUTANEOUS | Status: DC
  Administered 2013-05-01: 11 [IU] via SUBCUTANEOUS
  Administered 2013-05-02: 5 [IU] via SUBCUTANEOUS
  Administered 2013-05-02: 8 [IU] via SUBCUTANEOUS
  Administered 2013-05-02: 11 [IU] via SUBCUTANEOUS
  Administered 2013-05-02: 8 [IU] via SUBCUTANEOUS

## 2013-05-01 MED ORDER — OXYCODONE-ACETAMINOPHEN 5-325 MG PO TABS
1.0000 | ORAL_TABLET | ORAL | Status: DC | PRN
  Administered 2013-05-01 – 2013-05-10 (×24): 1 via ORAL
  Filled 2013-05-01 (×24): qty 1

## 2013-05-01 MED ORDER — MIDAZOLAM HCL 5 MG/5ML IJ SOLN
INTRAMUSCULAR | Status: DC | PRN
  Administered 2013-05-01: 2 mg via INTRAVENOUS

## 2013-05-01 MED ORDER — ENOXAPARIN SODIUM 30 MG/0.3ML ~~LOC~~ SOLN
30.0000 mg | SUBCUTANEOUS | Status: DC
  Administered 2013-05-02 – 2013-05-07 (×6): 30 mg via SUBCUTANEOUS
  Filled 2013-05-01 (×8): qty 0.3

## 2013-05-01 MED ORDER — METOCLOPRAMIDE HCL 10 MG PO TABS
5.0000 mg | ORAL_TABLET | Freq: Three times a day (TID) | ORAL | Status: DC | PRN
  Administered 2013-05-10: 10 mg via ORAL
  Filled 2013-05-01: qty 1

## 2013-05-01 MED ORDER — METOCLOPRAMIDE HCL 5 MG/ML IJ SOLN
5.0000 mg | Freq: Three times a day (TID) | INTRAMUSCULAR | Status: DC | PRN
  Administered 2013-05-04: 10 mg via INTRAVENOUS
  Filled 2013-05-01: qty 2

## 2013-05-01 MED ORDER — ENOXAPARIN SODIUM 30 MG/0.3ML ~~LOC~~ SOLN: 30.0000 mg | SUBCUTANEOUS | Status: DC

## 2013-05-01 SURGICAL SUPPLY — 32 items
BAG SPEC THK2 15X12 ZIP CLS (MISCELLANEOUS) ×1
BAG ZIPLOCK 12X15 (MISCELLANEOUS) ×2 IMPLANT
BANDAGE ELASTIC 6 VELCRO ST LF (GAUZE/BANDAGES/DRESSINGS) ×1 IMPLANT
BANDAGE ESMARK 6X9 LF (GAUZE/BANDAGES/DRESSINGS) ×1 IMPLANT
BANDAGE GAUZE ELAST BULKY 4 IN (GAUZE/BANDAGES/DRESSINGS) ×2 IMPLANT
BNDG CMPR 9X6 STRL LF SNTH (GAUZE/BANDAGES/DRESSINGS) ×1
BNDG ESMARK 6X9 LF (GAUZE/BANDAGES/DRESSINGS) ×2
CLOTH BEACON ORANGE TIMEOUT ST (SAFETY) ×2 IMPLANT
CUFF TOURN SGL QUICK 18 (TOURNIQUET CUFF) IMPLANT
CUFF TOURN SGL QUICK 24 (TOURNIQUET CUFF)
CUFF TOURN SGL QUICK 34 (TOURNIQUET CUFF)
CUFF TRNQT CYL 24X4X40X1 (TOURNIQUET CUFF) IMPLANT
CUFF TRNQT CYL 34X4X40X1 (TOURNIQUET CUFF) IMPLANT
DRAIN PENROSE 18X1/2 LTX STRL (DRAIN) ×2 IMPLANT
DRSG PAD ABDOMINAL 8X10 ST (GAUZE/BANDAGES/DRESSINGS) ×4 IMPLANT
DURAPREP 26ML APPLICATOR (WOUND CARE) ×2 IMPLANT
ELECT REM PT RETURN 9FT ADLT (ELECTROSURGICAL) ×2
ELECTRODE REM PT RTRN 9FT ADLT (ELECTROSURGICAL) ×1 IMPLANT
EVACUATOR 1/8 PVC DRAIN (DRAIN) ×1 IMPLANT
GLOVE ECLIPSE 8.5 STRL (GLOVE) ×4 IMPLANT
GOWN STRL REIN XL XLG (GOWN DISPOSABLE) ×2 IMPLANT
HANDPIECE INTERPULSE COAX TIP (DISPOSABLE) ×2
KIT BASIN OR (CUSTOM PROCEDURE TRAY) ×2 IMPLANT
MANIFOLD NEPTUNE II (INSTRUMENTS) ×2 IMPLANT
PACK LOWER EXTREMITY WL (CUSTOM PROCEDURE TRAY) ×2 IMPLANT
PAD CAST 4YDX4 CTTN HI CHSV (CAST SUPPLIES) ×1 IMPLANT
PADDING CAST COTTON 4X4 STRL (CAST SUPPLIES) ×2
POSITIONER SURGICAL ARM (MISCELLANEOUS) ×2 IMPLANT
SET HNDPC FAN SPRY TIP SCT (DISPOSABLE) ×1 IMPLANT
SPONGE GAUZE 4X4 12PLY (GAUZE/BANDAGES/DRESSINGS) ×2 IMPLANT
SYR CONTROL 10ML LL (SYRINGE) ×2 IMPLANT
TOWEL OR 17X26 10 PK STRL BLUE (TOWEL DISPOSABLE) ×4 IMPLANT

## 2013-05-01 NOTE — Transfer of Care (Signed)
Immediate Anesthesia Transfer of Care Note  Patient: Karen Bennett  Procedure(s) Performed: Procedure(s) (LRB): INCISION AND DRAINAGE LEFT FOREFOOT ABCESS  (Left) APPLICATION OF WOUND VAC (Left)  Patient Location: PACU  Anesthesia Type: General  Level of Consciousness: sedated, patient cooperative and responds to stimulation  Airway & Oxygen Therapy: Patient Spontanous Breathing and Patient connected to face mask oxgen  Post-op Assessment: Report given to PACU RN and Post -op Vital signs reviewed and stable  Post vital signs: Reviewed and stable  Complications: No apparent anesthesia complications

## 2013-05-01 NOTE — Progress Notes (Signed)
CSW received referral for Other psychosocial needs.   CSW attempted to meet with pt, but pt currently in procedure.  CSW to follow up with pt and complete full psychosocial assessment at that time.  Jacklynn Lewis, MSW, [employer]  Clinical Social Work Coverage for [employer], [place]

## 2013-05-01 NOTE — Progress Notes (Signed)
Inpatient Diabetes Program Recommendations  AACE/ADA: New Consensus Statement on Inpatient Glycemic Control (2013)  Target Ranges:  Prepandial:   less than 140 mg/dL      Peak postprandial:   less than 180 mg/dL (1-2 hours)      Critically ill patients:  140 - 180 mg/dL   Reason for Visit: Hyperglycemia  Very familiar with pt from previous hospitalizations.  Results for Karen Bennett, Karen Bennett (MRN [id number]) as of 05/01/2013 13:50  Ref. Range 04/30/2013 20:17 04/30/2013 23:07 05/01/2013 00:40 05/01/2013 04:16 05/01/2013 08:05 05/01/2013 11:53  Glucose-Capillary Latest Range: 70-99 mg/dL 213 (H) 086 (H) 578 (H) 287 (H) 278 (H) 294 (H)   Results for NELTA, CAUDILL (MRN [id number]) as of 05/01/2013 13:50  Ref. Range 04/30/2013 17:10  Hemoglobin A1C Latest Range: <5.7 % 13.0 (H)   Inpatient Diabetes Program Recommendations Insulin - Basal: Add Lantus 25 units QHS. Titrate until FBS<180 mg/dL Correction (SSI): Increase Novolog to resistant Q4 while NPO.  When diet advanced, tidwc and hs HgbA1C: 13.0% uncontrolled Diet: When advanced, CHO mod med  Note: Will follow while inpatient.  Thank you. Ailene Ards, RD, LDN, CDE Inpatient Diabetes Coordinator [phone]

## 2013-05-01 NOTE — Preoperative (Signed)
Beta Blockers   Reason not to administer Beta Blockers:Not Applicable 

## 2013-05-01 NOTE — Progress Notes (Signed)
Received report from PACU, Pt. Back to unit post incision and drainage of left foot abscess. Alert and oriented. Lt. Foot elevated, wound VAC in place. Will continue with current plan of care.

## 2013-05-01 NOTE — Anesthesia Preprocedure Evaluation (Signed)
Anesthesia Evaluation  Patient identified by MRN, date of birth, ID band Patient awake    Reviewed: Allergy & Precautions, H&P , NPO status , Patient's Chart, lab work & pertinent test results  Airway Mallampati: II TM Distance: >3 FB Neck ROM: full    Dental no notable dental hx.    Pulmonary neg pulmonary ROS,  History PE breath sounds clear to auscultation  Pulmonary exam normal       Cardiovascular Exercise Tolerance: Good negative cardio ROS  Rhythm:regular Rate:Normal     Neuro/Psych negative neurological ROS  negative psych ROS   GI/Hepatic negative GI ROS, Neg liver ROS,   Endo/Other  diabetes, Poorly Controlled, Type obesityUncontrolled DM not on meds  Renal/GU negative Renal ROS  negative genitourinary   Musculoskeletal   Abdominal (+) + obese,   Peds  Hematology negative hematology ROS (+)   Anesthesia Other Findings   Reproductive/Obstetrics negative OB ROS                           Anesthesia Physical Anesthesia Plan  ASA: III  Anesthesia Plan: General   Post-op Pain Management:    Induction: Intravenous  Airway Management Planned: LMA  Additional Equipment:   Intra-op Plan:   Post-operative Plan:   Informed Consent: I have reviewed the patients History and Physical, chart, labs and discussed the procedure including the risks, benefits and alternatives for the proposed anesthesia with the patient or authorized representative who has indicated his/her understanding and acceptance.   Dental Advisory Given  Plan Discussed with: CRNA and Surgeon  Anesthesia Plan Comments:         Anesthesia Quick Evaluation

## 2013-05-01 NOTE — Progress Notes (Signed)
Call from labwith result of gram stain, from tissue--no WBC, gram +cocci; from wound--no WBC, No organism. MD notified.

## 2013-05-01 NOTE — Brief Op Note (Signed)
04/30/2013 - 05/01/2013  2:58 PM  PATIENT:  Karen Bennett  37 y.o. female  PRE-OPERATIVE DIAGNOSIS:  left forefoot abcess  POST-OPERATIVE DIAGNOSIS:  left forefoot abcess, with large tumorlike overlying soft tissue mass.  PROCEDURE: Procedure(s): INCISION AND DRAINAGE LEFT FOREFOOT ABCESS  (Left) APPLICATION OF WOUND VAC (Left) Debridement of bone, subcutaneous tissue and skin, 8cm diameter, 50cm2   SURGEON:  Surgeon(s) and Role: Kerrin Champagne, MD - Primary   ANESTHESIA:   general Dr. Leta Jungling.  EBL:  Total I/O In: -  Out: 50 [Blood:50]  BLOOD ADMINISTERED:none  DRAINS:VAC to the left lateral forefoot    LOCAL MEDICATIONS USED:  NONE  SPECIMEN:  Source of Specimen:  specimen of 5th metatarsal base bone and 8cm tumorous appearing overlying tissue  DISPOSITION OF SPECIMEN:  swab of purulent abscess sent for C&S anaerobe and aerobe, bone sent for C&S to micro.  COUNTS:  YES  TOURNIQUET:   Total Tourniquet Time Documented: Calf (Left) - 23 minutes Total: Calf (Left) - 23 minutes   DICTATION: .Reubin Milan Dictation  PLAN OF CARE: Admit to inpatient   PATIENT DISPOSITION:  PACU - hemodynamically stable.   Delay start of Pharmacological VTE agent (>24hrs) due to surgical blood loss or risk of bleeding: no

## 2013-05-01 NOTE — Op Note (Signed)
04/30/2013 - 05/01/2013  3:07 PM  PATIENT:  Karen Bennett  37 y.o. female  MRN: [id number]  OPERATIVE REPORT  PRE-OPERATIVE DIAGNOSIS:  left forefoot abcess  POST-OPERATIVE DIAGNOSIS:  left forefoot abcess with large overlying soft tissue mass tumorous in appearance. Rule out scar carcinoma.  PROCEDURE:  Procedure(s): INCISION AND DRAINAGE LEFT FOREFOOT ABCESS  APPLICATION OF WOUND VAC debridement of bone subcutaneous tissue muscle tissue and skin 8 cm diameter area over the lateral forefoot. 50 cm square.    SURGEON:  Kerrin Champagne, MD     ANESTHESIA:  General, Dr. Leta Jungling    COMPLICATIONS:  None.     COMPONENTS: Medium VAC applied.   PROCEDURE: The patient was met in the holding area, and the appropriate left foot identified and marked with an "X" and my initials.       The patient was then transported to OR and was placed on the operative table in a supine position. The patient was then placed under  general anesthesia without difficulty. The patient is receiving appropriate preoperative antibiotic treatment.  Tourniquet was applied to the operative  thigh.  Leg was then prepped using sterile conditions and draped using sterile technique.  Tourniquet about the left mid calf .Time-out procedure was called and correct  .    The left leg was elevated and Esmarch exsanguinated with a thigh   tourniquet elevated ot 280 mmHg. the initial incision made over the dorsal lateral aspect of the left lateral forefoot in line with the previous old incision scar used for fourth and fifth ray resections. Incision was carried through the border of what appeared to be reactive tissue deep to the more normal appearing tissue. This was then carried along the distal lateral aspect of the forefoot and a curved about the plantar aspect of the left foot  ellipsing and resecting an approximately 8 centimeter diameter tumorous subcutaneous tissue. Purulent drainage noted at the base of the resected  portions of the fifth metatarsal and extending along the superior lateral aspect of the metatarsal. The fifth metatarsal base was resected back to the metatarsal tarsal joint using Leksell rongeur and sharp dissection with 10 blade scalpel. Additional purulent drainage did come from the joint. The base of the fourth metatarsal appeared to be covered with granulation and showed no purulence associated with its bony base. With this then attempts at expressing more purulence for unsuccessful area tourniquet was released and small bleeders were controlled using electrocautery. Irrigation was carried out using 500 cc of double antibiotic solution. A medium VAC sponge was then applied to the wound and fixed to the skin surrounding the wound with tincture of benzoin and the back layer of Tegaderm-like material. Additional iodine Vi-Drape was used to tack down the the edges of the VAC dressing and suction apparatus attached. The back was able to provide sections and and was maintained in 125 mm mercury. A four-inch Ace wrap then applied about the left foot. Patient specimens of mass overlying the left lateral forefoot abscess sent for pathology diagnosis as was bone specimen. Additional swabs were obtained from the initial abscess and sent for both anaerobic and aerobic culture and sensitivity. A container of bone fragments representing the base of the fifth metatarsal were additionally sent for culture. Stat Gram stains were ordered. All instrument and sponge counts were correct. Patient was then reactivated extubated and returned to recovery room in satisfactory condition.       NITKA,JAMES E  05/01/2013, 3:07 PM

## 2013-05-01 NOTE — Anesthesia Postprocedure Evaluation (Signed)
Anesthesia Post Note  Patient: Karen Bennett  Procedure(s) Performed: Procedure(s) (LRB): INCISION AND DRAINAGE LEFT FOREFOOT ABCESS  (Left) APPLICATION OF WOUND VAC (Left)  Anesthesia type: General  Patient location: PACU  Post pain: Pain level controlled  Post assessment: Post-op Vital signs reviewed  Last Vitals: BP 136/52  Pulse 86  Temp(Src) 37.3 C (Oral)  Resp 20  Ht 5' 8.11" (1.73 m)  Wt 376 lb 5.2 oz (170.7 kg)  BMI 57.03 kg/m2  SpO2 98%  LMP 04/13/2013  Post vital signs: Reviewed  Level of consciousness: sedated  Complications: No apparent anesthesia complications

## 2013-05-01 NOTE — Progress Notes (Addendum)
Subjective:   Procedure(s) (LRB): INCISION AND DRAINAGE LEFT FOREFOOT ABCESS  (Left) Awake, alert and oriented x 4 right foot with swelling, dressing is intact. Moderate discomfort. Time of surgery not verified but will be after 1 PM, I will call when a Time is definite. Patient reports pain as moderate.    Objective:   VITALS:  Temp:  [98.1 F (36.7 C)-98.8 F (37.1 C)] 98.8 F (37.1 C) (10/09 0600) Pulse Rate:  [95-117] 95 (10/09 0600) Resp:  [18-20] 20 (10/09 0600) BP: (120-183)/(62-94) 120/62 mmHg (10/09 0600) SpO2:  [95 %-98 %] 96 % (10/09 0600) Weight:  [163.295 kg (360 lb)-170.7 kg (376 lb 5.2 oz)] 170.7 kg (376 lb 5.2 oz) (10/08 2100)  ABD soft Intact pulses distally Dorsiflexion/Plantar flexion intact Incision: dressing C/D/I   LABS  Recent Labs  04/30/13 1710 05/01/13 0527  HGB 13.4 11.6*  WBC 7.7 5.6  PLT 149* 147*    Recent Labs  04/30/13 1710 05/01/13 0527  NA 124* 128*  K 3.4* 3.1*  CL 88* 94*  CO2 20 22  BUN 12 13  CREATININE 0.63 0.67  GLUCOSE 411* 306*    Recent Labs  05/01/13 0527  INR 1.05     Assessment/Plan:   Procedure(s) (LRB): INCISION AND DRAINAGE LEFT FOREFOOT ABCESS  (Left)  Continue ABX therapy due to left foot cellulitis and abscess.Culture taken of surgical site during local incision and drainage last evening. OR this afternoon for formal incision, debridement and drainage. Patient was seen and examined in the preop holding area. There has been no interval  Change in this patient's exam preop  history and physical exam  Lab tests and images have been examined and reviewed.  The Risks benefits and alternative treatments have been discussed  extensively,questions answered.  The patient has elected to undergo the discussed surgical treatment. Lindsay Straka E 05/01/2013, 8:21 AM

## 2013-05-01 NOTE — Progress Notes (Signed)
[facility] PROGRESS NOTE  [place] Eid ZOX:[id number] DOB: 1975-09-20 DOA: 04/30/2013 PCP: No primary provider on file.  Assessment/Plan: 1. Osteomyelitis fourth/fifth left metatarsal; wound care per orthopedic surgery -Continue antibiotics per surgery  2. Diabetes type 2;, increase SSI moderate, obtain lipid panel -Hemoglobin A1c 04/30/2013= 13   3. Hyponatremia; improving with hydration continue normal saline -Foley inserted and tell patient cleared by surgery to bear weight on foot  4. Hypokalemia; repeat CMP, magnesium, will replete deficiencies   5. Infection ; obtain daily CBC  Code Status:Full Family Communication: Disposition Plan:    Consultants:  Orthopedic surgery  Procedures: 05/01/2013  INCISION AND DRAINAGE LEFT FOREFOOT ABCESS (Left) by Dr Kerrin Champagne (orthopedics) .   Antibiotics:  Zosyn 10/9>>  Vancomycin 10/9>>    HPI/Subjective: Karen Bennett is a 37 y.o. 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 In 10/2011 Dr. Doneen Poisson performed a repeat I and D of the Left foot. She has been extremely noncompliant with any followup with orthopaedics and Dr. Annell Greening was on Call at the last ER presentation for her left foot in 08/2012 and preformed an I and D of a left foot lateral abscess. There are no office notes post op and it appears as though she did not return for follow up with Dr. Ophelia Charter after that  Procedure. There are no records of Dr. Ophelia Charter being contacted about increasing problems with the left foot this Week. [place] indicates that she called Dr. Ophelia Charter and a prescription for Keflex was called in for her. She presents With a two week history of increasing left foot swelling erythrema and pain. Had noticed intermittant chills, no fever. Seen in [facility] with normal WBC But definite left foot swelling erythrema and warmth consistent  with a left Foot lateral abscess. Admitting MD describing as a grapefruit size swelling of the left lateral foot. xrays are concerning  For sclerotic changes of the distal resected ends of the 4th and 5th metatarsal amputation ends. TODAY just returned from surgery, positive pain, positive inability to void urine.   Objective: Filed Vitals:   05/01/13 1530 05/01/13 1545 05/01/13 1600 05/01/13 1829  BP: 130/62 136/52 103/68 139/79  Pulse: 89 86 86 99  Temp:  99.1 F (37.3 C) 99.1 F (37.3 C) 98.6 F (37 C)  TempSrc:    Oral  Resp: 19 20 18 20   Height:      Weight:      SpO2: 97% 98% 97% 95%    Intake/Output Summary (Last 24 hours) at 05/01/13 1932 Last data filed at 05/01/13 1545  Gross per 24 hour  Intake 1873.75 ml  Output     50 ml  Net 1823.75 ml   Filed Weights   04/30/13 1856 04/30/13 2100  Weight: 163.295 kg (360 lb) 170.7 kg (376 lb 5.2 oz)    Exam:   General:  A./O. x4, mild distress secondary to left foot pain  Cardiovascular: Regular rhythm and rate, negative murmurs rubs gallops   Respiratory: Good auscultation  Abdomen: Over the obese, plus bowel some  Musculoskeletal: Left foot wrapped in Ace bandage with wound VAC present over the remnants of fourth and fifth metatarsal   Data Reviewed: Basic Metabolic Panel:  Recent Labs Lab 04/30/13 1710 05/01/13 0527  NA 124* 128*  K 3.4* 3.1*  CL 88* 94*  CO2 20 22  GLUCOSE 411* 306*  BUN 12 13  CREATININE 0.63 0.67  CALCIUM 9.2 8.7   Liver Function Tests: No results found for this basename: AST, ALT, ALKPHOS, BILITOT, PROT, ALBUMIN,  in the last 168 hours No results found for this basename: LIPASE, AMYLASE,  in the last 168 hours No results found for this basename: AMMONIA,  in the last 168 hours CBC:  Recent Labs Lab 04/30/13 1710 05/01/13 0527  WBC 7.7 5.6  NEUTROABS 6.0  --   HGB 13.4 11.6*  HCT 38.0 33.3*  MCV 83.7 84.1  PLT 149* 147*   Cardiac Enzymes: No results found for this  basename: CKTOTAL, CKMB, CKMBINDEX, TROPONINI,  in the last 168 hours BNP (last 3 results) No results found for this basename: PROBNP,  in the last 8760 hours CBG:  Recent Labs Lab 05/01/13 0416 05/01/13 0805 05/01/13 1153 05/01/13 1507 05/01/13 1730  GLUCAP 287* 278* 294* 323* 272*    Recent Results (from the past 240 hour(s))  CULTURE, ROUTINE-ABSCESS     Status: None   Collection Time    04/30/13 11:00 PM      Result Value Range Status   Specimen Description ABSCESS LEFT FOOT   Final   Special Requests NONE   Final   Gram Stain     Final   Value: RARE WBC PRESENT, PREDOMINANTLY PMN     NO SQUAMOUS EPITHELIAL CELLS SEEN     NO ORGANISMS SEEN     Performed at [facility]   Culture     Final   Value: NO GROWTH     Performed at [facility]   Report Status PENDING   Incomplete  GRAM STAIN     Status: None   Collection Time    05/01/13  2:08 PM      Result Value Range Status   Specimen Description WOUND ABSCESS LEFT FOOT   Final   Special Requests PATIENT ON FOLLOWING VANCOMYCIN 1.5G ZOSYN 3.375G   Final   Gram Stain     Final   Value: NO ORGANISMS SEEN     NO WBC SEEN     Gram Stain Report Called to,Read Back By and Verified With: EDGALL,E. AT 1618 ON 10.09.14 BY LOVE,T.   Report Status 05/01/2013 FINAL   Final  GRAM STAIN     Status: None   Collection Time    05/01/13  2:14 PM      Result Value Range Status   Specimen Description FOOT LEFT METATARSAL   Final   Special Requests PATIENT ON FOLLOWING VANCOMYCIN 1.5G ZOSYN 3.375G   Final   Gram Stain     Final   Value: NO WBC SEEN     FEW GRAM POSITIVE COCCI     Gram Stain Report Called to,Read Back By and Verified With: EDGAL,E. AT 1617 ON 10.09.14 BY LOVE,T.   Report Status 05/01/2013 FINAL   Final     Studies: Dg Foot Complete Left  04/30/2013   CLINICAL DATA:  Nonhealing wound  EXAM: LEFT FOOT - COMPLETE 3+ VIEW  COMPARISON:  09/02/2012  FINDINGS: The 4th and 5th metatarsals and digits have  been amputated. The base of the 4th and 5th metatarsals remain intact, with diffuse sclerosis. There has been increasing erosion at the tips of these bones since the prior study. . There is also slight widening between the base of the 2nd metatarsal and medial cuneiform. Marked soft tissue swelling at the amputation site is noted.  IMPRESSION: There is soft tissue swelling about the 4th and  5th metatarsal amputation site. Slightly increased erosion of the distal aspect of the remainder of the bone may represent developing osteomyelitis.   Electronically Signed   By: Maryclare Bean M.D.   On: 04/30/2013 18:06    Scheduled Meds: . [START ON 05/02/2013] enoxaparin (LOVENOX) injection  30 mg Subcutaneous Q24H  . ferrous sulfate  325 mg Oral TID PC  . insulin aspart      . insulin aspart  0-9 Units Subcutaneous Q4H  . piperacillin-tazobactam (ZOSYN)  IV  3.375 g Intravenous Q8H  . vancomycin  1,500 mg Intravenous Q12H   Continuous Infusions:   Principal Problem:   Diabetic foot ulcer Active Problems:   DM (diabetes mellitus), type 2, uncontrolled with complications   Obesity, Class III, BMI 40-49.9 (morbid obesity)   Hyponatremia   Osteomyelitis   Cellulitis and abscess of leg, except foot   Diabetic peripheral neuropathy associated with type 2 diabetes mellitus    Time spent: 40 minutes    WOODS, CURTIS, J  [facility] Pager [phone]. If 7PM-7AM, please contact night-coverage at [email or web address], password [facility] 05/01/2013, 7:32 PM  LOS: 1 day

## 2013-05-02 ENCOUNTER — Encounter (HOSPITAL_COMMUNITY): Payer: Self-pay | Admitting: Specialist

## 2013-05-02 ENCOUNTER — Inpatient Hospital Stay (HOSPITAL_COMMUNITY): Payer: Medicaid Other

## 2013-05-02 DIAGNOSIS — E1169 Type 2 diabetes mellitus with other specified complication: Secondary | ICD-10-CM

## 2013-05-02 DIAGNOSIS — M869 Osteomyelitis, unspecified: Secondary | ICD-10-CM

## 2013-05-02 DIAGNOSIS — E785 Hyperlipidemia, unspecified: Secondary | ICD-10-CM

## 2013-05-02 HISTORY — DX: Type 2 diabetes mellitus with other specified complication: E11.69

## 2013-05-02 LAB — GLUCOSE, CAPILLARY
Glucose-Capillary: 243 mg/dL — ABNORMAL HIGH (ref 70–99)
Glucose-Capillary: 276 mg/dL — ABNORMAL HIGH (ref 70–99)
Glucose-Capillary: 277 mg/dL — ABNORMAL HIGH (ref 70–99)
Glucose-Capillary: 318 mg/dL — ABNORMAL HIGH (ref 70–99)

## 2013-05-02 LAB — CBC WITH DIFFERENTIAL/PLATELET
Basophils Relative: 0 % (ref 0–1)
Eosinophils Absolute: 0 10*3/uL (ref 0.0–0.7)
MCH: 28.8 pg (ref 26.0–34.0)
MCHC: 33.4 g/dL (ref 30.0–36.0)
Neutro Abs: 3.4 10*3/uL (ref 1.7–7.7)
Neutrophils Relative %: 54 % (ref 43–77)
Platelets: 182 10*3/uL (ref 150–400)
RBC: 3.68 MIL/uL — ABNORMAL LOW (ref 3.87–5.11)

## 2013-05-02 LAB — COMPREHENSIVE METABOLIC PANEL
ALT: 15 U/L (ref 0–35)
AST: 15 U/L (ref 0–37)
Albumin: 2.1 g/dL — ABNORMAL LOW (ref 3.5–5.2)
CO2: 25 mEq/L (ref 19–32)
Chloride: 97 mEq/L (ref 96–112)
Creatinine, Ser: 0.74 mg/dL (ref 0.50–1.10)
GFR calc Af Amer: >90 mL/min (ref >90)
GFR calc non Af Amer: >90 mL/min (ref >90)
Glucose, Bld: 306 mg/dL — ABNORMAL HIGH (ref 70–99)
Sodium: 132 mEq/L — ABNORMAL LOW (ref 135–145)
Total Bilirubin: 0.6 mg/dL (ref 0.3–1.2)

## 2013-05-02 LAB — LIPID PANEL
LDL Cholesterol: UNDETERMINED mg/dL (ref 0–99)
Total CHOL/HDL Ratio: 11.4 RATIO
Triglycerides: 520 mg/dL — ABNORMAL HIGH (ref <150)

## 2013-05-02 LAB — MAGNESIUM: Magnesium: 1.6 mg/dL (ref 1.5–2.5)

## 2013-05-02 LAB — VANCOMYCIN, TROUGH: Vancomycin Tr: 9.3 ug/mL — ABNORMAL LOW (ref 10.0–20.0)

## 2013-05-02 MED ORDER — IOHEXOL 300 MG/ML  SOLN
100.0000 mL | Freq: Once | INTRAMUSCULAR | Status: AC | PRN
  Administered 2013-05-02: 100 mL via INTRAVENOUS

## 2013-05-02 MED ORDER — ZOLPIDEM TARTRATE 5 MG PO TABS
5.0000 mg | ORAL_TABLET | Freq: Every evening | ORAL | Status: DC | PRN
  Administered 2013-05-02 – 2013-05-11 (×5): 5 mg via ORAL
  Filled 2013-05-02 (×5): qty 1

## 2013-05-02 MED ORDER — VANCOMYCIN HCL 10 G IV SOLR
1250.0000 mg | Freq: Three times a day (TID) | INTRAVENOUS | Status: DC
  Administered 2013-05-03 – 2013-05-06 (×11): 1250 mg via INTRAVENOUS
  Filled 2013-05-02 (×12): qty 1250

## 2013-05-02 MED ORDER — GABAPENTIN 300 MG PO CAPS
300.0000 mg | ORAL_CAPSULE | Freq: Every day | ORAL | Status: DC
  Administered 2013-05-02: 300 mg via ORAL
  Filled 2013-05-02 (×2): qty 1

## 2013-05-02 MED ORDER — INSULIN GLARGINE 100 UNIT/ML ~~LOC~~ SOLN
8.0000 [IU] | Freq: Every day | SUBCUTANEOUS | Status: DC
  Administered 2013-05-02 – 2013-05-03 (×2): 8 [IU] via SUBCUTANEOUS
  Filled 2013-05-02 (×2): qty 0.08

## 2013-05-02 MED ORDER — ATORVASTATIN CALCIUM 40 MG PO TABS
40.0000 mg | ORAL_TABLET | Freq: Every day | ORAL | Status: DC
  Administered 2013-05-02 – 2013-05-11 (×9): 40 mg via ORAL
  Filled 2013-05-02 (×11): qty 1

## 2013-05-02 MED ORDER — INSULIN ASPART 100 UNIT/ML ~~LOC~~ SOLN
0.0000 [IU] | SUBCUTANEOUS | Status: DC
  Administered 2013-05-02: 15 [IU] via SUBCUTANEOUS
  Administered 2013-05-02 – 2013-05-03 (×3): 11 [IU] via SUBCUTANEOUS
  Administered 2013-05-03 (×3): 7 [IU] via SUBCUTANEOUS
  Administered 2013-05-03: 11 [IU] via SUBCUTANEOUS
  Administered 2013-05-04 (×4): 7 [IU] via SUBCUTANEOUS
  Administered 2013-05-04 (×2): 11 [IU] via SUBCUTANEOUS
  Administered 2013-05-04: 4 [IU] via SUBCUTANEOUS
  Administered 2013-05-05: 3 [IU] via SUBCUTANEOUS
  Administered 2013-05-05 (×3): 7 [IU] via SUBCUTANEOUS
  Administered 2013-05-05 – 2013-05-06 (×2): 4 [IU] via SUBCUTANEOUS
  Administered 2013-05-06: 7 [IU] via SUBCUTANEOUS
  Administered 2013-05-06: 21:00:00 via SUBCUTANEOUS
  Administered 2013-05-06: 7 [IU] via SUBCUTANEOUS
  Administered 2013-05-06 – 2013-05-07 (×5): 4 [IU] via SUBCUTANEOUS
  Administered 2013-05-07: 11 [IU] via SUBCUTANEOUS
  Administered 2013-05-07 – 2013-05-08 (×5): 4 [IU] via SUBCUTANEOUS
  Administered 2013-05-08: 7 [IU] via SUBCUTANEOUS
  Administered 2013-05-08: 4 [IU] via SUBCUTANEOUS
  Administered 2013-05-08: 05:00:00 via SUBCUTANEOUS
  Administered 2013-05-09 (×4): 4 [IU] via SUBCUTANEOUS
  Administered 2013-05-09: 7 [IU] via SUBCUTANEOUS
  Administered 2013-05-09 – 2013-05-10 (×3): 4 [IU] via SUBCUTANEOUS
  Administered 2013-05-10: 7 [IU] via SUBCUTANEOUS
  Administered 2013-05-10 (×3): 4 [IU] via SUBCUTANEOUS
  Administered 2013-05-11: 3 [IU] via SUBCUTANEOUS
  Administered 2013-05-11: 4 [IU] via SUBCUTANEOUS
  Administered 2013-05-11: 7 [IU] via SUBCUTANEOUS
  Administered 2013-05-11: 3 [IU] via SUBCUTANEOUS
  Administered 2013-05-11: 18:00:00 via SUBCUTANEOUS
  Administered 2013-05-11: 4 [IU] via SUBCUTANEOUS
  Administered 2013-05-12: 13:00:00 via SUBCUTANEOUS
  Administered 2013-05-12 (×3): 4 [IU] via SUBCUTANEOUS

## 2013-05-02 NOTE — ED Provider Notes (Signed)
Medical screening examination/treatment/procedure(s) were conducted as a shared visit with non-physician practitioner(s) and myself.  I personally evaluated the patient during the encounter Pt presents w/ cellulitis and likely osteo of R foot. T 99.1, Pt non-toxic appearing.  Pt has open wound on R foot w/ dark blistering and erythma/warmth/edema of the foot.  IV abx given, ortho consulted, pt will be admitted to medical service.   Shanna Cisco, MD 05/02/13 [phone]

## 2013-05-02 NOTE — Evaluation (Signed)
Physical Therapy Evaluation Patient Details Name: Karen Bennett MRN: [id number] DOB: 14-Sep-1975 Today's Date: 05/02/2013 Time: 9562-1308 PT Time Calculation (min): 24 min  PT Assessment / Plan / Recommendation History of Present Illness  s/p I and D left foot abcess with VAC placement  Clinical Impression  Pt will benefit from PT to address deficits below;     PT Assessment  Patient needs continued PT services    Follow Up Recommendations  No PT follow up    Does the patient have the potential to tolerate intense rehabilitation      Barriers to Discharge        Equipment Recommendations  Rolling walker with 5" wheels (wide pt weighs #[phone])    Recommendations for Other Services     Frequency Min 3X/week    Precautions / Restrictions Precautions Precautions: Fall Precaution Comments: DARCO shoe L  Restrictions LLE Weight Bearing: Partial weight bearing   Pertinent Vitals/Pain C/o left foot pain, RN aware      Mobility  Bed Mobility Bed Mobility: Supine to Sit Supine to Sit: 5: Supervision;HOB elevated;With rails Details for Bed Mobility Assistance: cues for technique Transfers Transfers: Sit to Stand;Stand to Sit Sit to Stand: 4: Min assist Stand to Sit: 4: Min assist Details for Transfer Assistance: cues for hand placement and wt shift Ambulation/Gait Ambulation/Gait Assistance: 4: Min assist Ambulation Distance (Feet): 20 Feet Assistive device: Rolling walker Ambulation/Gait Assistance Details: cues for sequence, RW safety and PWB Gait Pattern: Step-to pattern Gait velocity: decr    Exercises     PT Diagnosis: Difficulty walking  PT Problem List: Decreased activity tolerance;Decreased mobility;Decreased knowledge of precautions;Decreased knowledge of use of DME;Obesity PT Treatment Interventions: DME instruction;Gait training;Functional mobility training;Therapeutic activities;Therapeutic exercise     PT Goals(Current goals can be found in the  care plan section) Acute Rehab PT Goals Patient Stated Goal: home PT Goal Formulation: With patient Time For Goal Achievement: 05/09/13 Potential to Achieve Goals: Good  Visit Information  Last PT Received On: 05/02/13 Assistance Needed: +2 (safety) History of Present Illness: s/p I and D left foot abcess with VAC placement       Prior Functioning  Home Living Family/patient expects to be discharged to:: Private residence Living Arrangements: Alone Type of Home: Mobile home Home Access: Ramped entrance Home Layout: One level Home Equipment: None Additional Comments: pt states she will not have anyone to help her or check on her at home Prior Function Level of Independence: Independent Communication Communication: No difficulties    Cognition  Cognition Arousal/Alertness: Awake/alert Behavior During Therapy: WFL for tasks assessed/performed Overall Cognitive Status: Within Functional Limits for tasks assessed    Extremity/Trunk Assessment Upper Extremity Assessment Upper Extremity Assessment: Overall WFL for tasks assessed Lower Extremity Assessment Lower Extremity Assessment: LLE deficits/detail LLE Deficits / Details: limited ankle dorsiflexion due to pain, bandaging and soft tissue   Balance    End of Session PT - End of Session Equipment Utilized During Treatment: Gait belt Activity Tolerance: Patient limited by pain Patient left: in chair;with call bell/phone within reach Nurse Communication: Mobility status  GP     [facility] 05/02/2013, 11:41 AM

## 2013-05-02 NOTE — Consult Note (Signed)
WOC wound consult note Reason for Consult: Wound consult requested for vac dressing assistance.  Pt went to the OR yesterday with ortho service for an I&D and vac was applied at that time.  Discussed plan of care with Dr Otelia Sergeant via phone.  He does not desire a dressing change today.  Vac intact with good seal at cont suction to left foot.  Small amt light red drainage in cannister.   Wound type: Full thickness post-op wound.  Dr Otelia Sergeant states that his partner may perform the first post-op dressing change on Saturday; otherwise it will be done on Monday.  Vac dressing kit at bedside. Cammie Mcgee MSN, RN, CWOCN, [place], CNS [phone]

## 2013-05-02 NOTE — Progress Notes (Signed)
[facility] PROGRESS NOTE  Karen Bennett UYQ:[id number] DOB: 1975/11/25 DOA: 04/30/2013 PCP: No primary provider on file.  Assessment/Plan: 1. Osteomyelitis fourth/fifth left metatarsal; wound care per orthopedic surgery -Continue antibiotics per surgery  2. Diabetes type 2;, increased SSI to Resistant and started Lantus 8 unit QD, obtain lipid panel -Hemoglobin A1c 04/30/2013= 13 -Discussed with patient the need to obtain control of her diabetes if she wishes her wounds to heal, and does not want to lose the remainder of her foot. -Spoke with NCM Concerning obtaining a waiver for patient to be placed on Match program again in order to obtain her medication   3. Hyponatremia; improving with hydration continue normal saline -Remove Foley patient cleared by surgery to bear weight on foot in a boot  4. Hypokalemia; obtain Am CMP, magnesium, will replete deficiencies   5. Infection ; obtain daily CBC  6. HLD; start Lipitor 40 mg QD  7. Phantom Pain; Start Neurontin   8. Orthopedic Pain; Continue current pain regimen   Code Status:  Full Family Communication: Disposition Plan:    Consultants:  Orthopedic surgery  Procedures: 05/01/2013  INCISION AND DRAINAGE LEFT FOREFOOT ABCESS (Left) by Dr Kerrin Champagne (orthopedics) .   Antibiotics:  Zosyn 10/9>>  Vancomycin 10/9>>    HPI/Subjective: Karen Bennett is a 37 y.o. 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 In 10/2011 Dr. Doneen Poisson performed a repeat I and D of the Left foot. She has been extremely noncompliant with any followup with orthopaedics and Dr. Annell Greening was on Call at the last ER presentation for her left foot in 08/2012 and preformed an I and D of a left foot lateral abscess. There are no office notes post op and it appears as though she did not return for follow up with Dr. Ophelia Charter after  that  Procedure. There are no records of Dr. Ophelia Charter being contacted about increasing problems with the left foot this Week. [name] indicates that she called Dr. Ophelia Charter and a prescription for Keflex was called in for her. She presents With a two week history of increasing left foot swelling erythrema and pain. Had noticed intermittant chills, no fever. Seen in [facility] with normal WBC But definite left foot swelling erythrema and warmth consistent with a left Foot lateral abscess. Admitting MD describing as a grapefruit size swelling of the left lateral foot. xrays are concerning  For sclerotic changes of the distal resected ends of the 4th and 5th metatarsal amputation ends. 05/01/2013 just returned from surgery, positive pain, positive inability to void urine. TODAY states feeling better except for pain in foot which she describes as electric/scrapping. Would like Foley removed      Objective: Filed Vitals:   05/01/13 1600 05/01/13 1829 05/01/13 2200 05/02/13 0600  BP: 103/68 139/79 163/80 120/82  Pulse: 86 99 85 84  Temp: 99.1 F (37.3 C) 98.6 F (37 C) 99.1 F (37.3 C) 98.8 F (37.1 C)  TempSrc:  Oral Oral Oral  Resp: 18 20 20 20   Height:      Weight:      SpO2: 97% 95% 94% 96%    Intake/Output Summary (Last 24 hours) at 05/02/13 0836 Last data filed at 05/02/13 0500  Gross per 24 hour  Intake   2100 ml  Output     50 ml  Net   2050 ml   Filed Weights   04/30/13 1856  04/30/13 2100  Weight: 163.295 kg (360 lb) 170.7 kg (376 lb 5.2 oz)    Exam:   General:  A./O. X4, NAD  Cardiovascular: Regular rhythm and rate, negative murmurs rubs gallops   Respiratory: Good auscultation  Abdomen: Over the obese, plus bowel some  Musculoskeletal: Left foot wrapped in Ace bandage with wound VAC present over the remnants of fourth and fifth metatarsal. Sitting in chair   Data Reviewed: Basic Metabolic Panel:  Recent Labs Lab 04/30/13 1710 05/01/13 0527 05/02/13 0520  NA 124* 128*  132*  K 3.4* 3.1* 3.4*  CL 88* 94* 97  CO2 20 22 25   GLUCOSE 411* 306* 306*  BUN 12 13 10   CREATININE 0.63 0.67 0.74  CALCIUM 9.2 8.7 8.7  MG  --   --  1.6   Liver Function Tests:  Recent Labs Lab 05/02/13 0520  AST 15  ALT 15  ALKPHOS 63  BILITOT 0.6  PROT 6.4  ALBUMIN 2.1*   No results found for this basename: LIPASE, AMYLASE,  in the last 168 hours No results found for this basename: AMMONIA,  in the last 168 hours CBC:  Recent Labs Lab 04/30/13 1710 05/01/13 0527 05/02/13 0520  WBC 7.7 5.6 6.3  NEUTROABS 6.0  --  3.4  HGB 13.4 11.6* 10.6*  HCT 38.0 33.3* 31.7*  MCV 83.7 84.1 86.1  PLT 149* 147* 182   Cardiac Enzymes: No results found for this basename: CKTOTAL, CKMB, CKMBINDEX, TROPONINI,  in the last 168 hours BNP (last 3 results) No results found for this basename: PROBNP,  in the last 8760 hours CBG:  Recent Labs Lab 05/01/13 1730 05/01/13 2017 05/02/13 0016 05/02/13 0408 05/02/13 0745  GLUCAP 272* 335* 276* 277* 243*    Recent Results (from the past 240 hour(s))  CULTURE, ROUTINE-ABSCESS     Status: None   Collection Time    04/30/13 11:00 PM      Result Value Range Status   Specimen Description ABSCESS LEFT FOOT   Final   Special Requests NONE   Final   Gram Stain     Final   Value: RARE WBC PRESENT, PREDOMINANTLY PMN     NO SQUAMOUS EPITHELIAL CELLS SEEN     NO ORGANISMS SEEN     Performed at [facility]   Culture     Final   Value: NO GROWTH     Performed at [facility]   Report Status PENDING   Incomplete  WOUND CULTURE     Status: None   Collection Time    05/01/13  2:08 PM      Result Value Range Status   Specimen Description WOUND ABSCESS LEFT FOOT   Final   Special Requests PATIENT ON FOLLOWING VANCOMYCIN 1.5G ZOSYN 3.375G   Final   Gram Stain PENDING   Incomplete   Culture     Final   Value: NO GROWTH 1 DAY     Performed at [facility]   Report Status PENDING   Incomplete  GRAM STAIN      Status: None   Collection Time    05/01/13  2:08 PM      Result Value Range Status   Specimen Description WOUND ABSCESS LEFT FOOT   Final   Special Requests PATIENT ON FOLLOWING VANCOMYCIN 1.5G ZOSYN 3.375G   Final   Gram Stain     Final   Value: NO ORGANISMS SEEN     NO WBC SEEN  Gram Stain Report Called to,Read Back By and Verified With: EDGALL,E. AT 1618 ON 10.09.14 BY LOVE,T.   Report Status 05/01/2013 FINAL   Final  TISSUE CULTURE     Status: None   Collection Time    05/01/13  2:14 PM      Result Value Range Status   Specimen Description FOOT LEFT METATARSAL   Final   Special Requests PATIENT ON FOLLOWING VANCOMYCIN 1.5G ZOSYN 3.375G   Final   Gram Stain     Final   Value: NO WBC SEEN     FEW GRAM POSITIVE COCCI IN PAIRS     Performed by [facility]     Performed at [facility]   Culture     Final   Value: NO GROWTH 1 DAY     Performed at [facility]   Report Status PENDING   Incomplete  ANAEROBIC CULTURE     Status: None   Collection Time    05/01/13  2:14 PM      Result Value Range Status   Specimen Description FOOT LEFT METATARSAL   Final   Special Requests PATIENT ON FOLLOWING VANCOMYCIN 1.5G ZOSYN 3.375G   Final   Gram Stain     Final   Value: NO WBC SEEN     FEW GRAM POSITIVE COCCI IN PAIRS     Performed by [facility]     Performed at [facility]   Culture PENDING   Incomplete   Report Status PENDING   Incomplete  GRAM STAIN     Status: None   Collection Time    05/01/13  2:14 PM      Result Value Range Status   Specimen Description FOOT LEFT METATARSAL   Final   Special Requests PATIENT ON FOLLOWING VANCOMYCIN 1.5G ZOSYN 3.375G   Final   Gram Stain     Final   Value: NO WBC SEEN     FEW GRAM POSITIVE COCCI     Gram Stain Report Called to,Read Back By and Verified With: EDGAL,E. AT 1617 ON 10.09.14 BY LOVE,T.   Report Status 05/01/2013 FINAL   Final     Studies: Dg Foot Complete Left  04/30/2013    CLINICAL DATA:  Nonhealing wound  EXAM: LEFT FOOT - COMPLETE 3+ VIEW  COMPARISON:  09/02/2012  FINDINGS: The 4th and 5th metatarsals and digits have been amputated. The base of the 4th and 5th metatarsals remain intact, with diffuse sclerosis. There has been increasing erosion at the tips of these bones since the prior study. . There is also slight widening between the base of the 2nd metatarsal and medial cuneiform. Marked soft tissue swelling at the amputation site is noted.  IMPRESSION: There is soft tissue swelling about the 4th and 5th metatarsal amputation site. Slightly increased erosion of the distal aspect of the remainder of the bone may represent developing osteomyelitis.   Electronically Signed   By: Maryclare Bean M.D.   On: 04/30/2013 18:06    Scheduled Meds: . enoxaparin (LOVENOX) injection  30 mg Subcutaneous Q24H  . ferrous sulfate  325 mg Oral TID PC  . insulin aspart  0-15 Units Subcutaneous Q4H  . piperacillin-tazobactam (ZOSYN)  IV  3.375 g Intravenous Q8H  . vancomycin  1,500 mg Intravenous Q12H   Continuous Infusions:   Principal Problem:   Diabetic foot ulcer Active Problems:   DM (diabetes mellitus), type 2, uncontrolled with complications   Obesity, Class III, BMI 40-49.9 (  morbid obesity)   Hyponatremia   Osteomyelitis   Cellulitis and abscess of leg, except foot   Diabetic peripheral neuropathy associated with type 2 diabetes mellitus   Hypokalemia    Time spent: 40 minutes    WOODS, CURTIS, J  [facility] Pager [phone]. If 7PM-7AM, please contact night-coverage at [email or web address], password [facility] 05/02/2013, 8:36 AM  LOS: 2 days

## 2013-05-02 NOTE — Progress Notes (Signed)
Patient ID: Karen Bennett, female   DOB: [birth year], 37 y.o.   MRN: [id number] Subjective: 1 Day Post-Op Procedure(s) (LRB): INCISION AND DRAINAGE LEFT FOREFOOT ABCESS  (Left) APPLICATION OF WOUND VAC (Left) No chills, awake, alert and oriented x 4. Would like to get out of bed. Has a Darco shoe in the room.  Patient reports pain as mild.    Objective:   VITALS:  Temp:  [98.6 F (37 C)-100.1 F (37.8 C)] 98.8 F (37.1 C) (10/10 0600) Pulse Rate:  [84-99] 84 (10/10 0600) Resp:  [18-20] 20 (10/10 0600) BP: (103-163)/(52-82) 120/82 mmHg (10/10 0600) SpO2:  [94 %-98 %] 96 % (10/10 0600)  Neurologically intact ABD soft Intact pulses distally Dorsiflexion/Plantar flexion intact Foot is warm and there is swelling of the dorsal foot, VAC is drawning negative pressure and intact.   LABS  Recent Labs  04/30/13 1710 05/01/13 0527 05/02/13 0520  HGB 13.4 11.6* 10.6*  WBC 7.7 5.6 6.3  PLT 149* 147* 182    Recent Labs  05/01/13 0527 05/02/13 0520  NA 128* 132*  K 3.1* 3.4*  CL 94* 97  CO2 22 25  BUN 13 10  CREATININE 0.67 0.74  GLUCOSE 306* 306*    Recent Labs  05/01/13 0527  INR 1.05  Gram Stains of soft tissue cultures and deep wound abscess cultures are showing gram positive cocci. Sed rate 100 CRP 33   Assessment/Plan: 1 Day Post-Op Procedure(s) (LRB): INCISION AND DRAINAGE LEFT FOREFOOT ABCESS  (Left) APPLICATION OF WOUND VAC (Left) Osteomyelitis, base of the fifth metatarsal, this has been resected there was also pyarthrosis of the MT-T joints laterally. Tumorous soft tissue is in pathology for diagnosis. Rule out chronic wound carcinoma. Robbie Louis is greater than one year old.  Advance diet Up with therapy Continue ABX therapy due to left foot infection change to more specific antibiotics when organism is identified and sensitivities known. Will order CT scan with contrast to assess for any residual areas of undrained abscesses. Surgeon needs to be  present with 1st VAC change, please call my office [phone] to arrange with the on call surgeon. I will be out of town till Wed next week, my partners at [facility] are available for any needs. [phone]  Evona Westra E 05/02/2013, 8:21 AM

## 2013-05-02 NOTE — ED Provider Notes (Signed)
CSN: [id number]     Arrival date & time 04/30/13  1558 History   First MD Initiated Contact with Patient 04/30/13 1636     Chief Complaint  Patient presents with  . left foot infection    (Consider location/radiation/quality/duration/timing/severity/associated sxs/prior Treatment) HPI Patient presents emergency department with swelling to the left lateral foot started about 1 week ago, but has gotten worse over the last several days.  Patient, states she's noted dark blistering areas to the skin of this over part of her foot.  The patient, states, that she's also noticed increased, redness and swelling to her foot as well.  Patient had amputation of the fourth and fifth toes.  Back in March.  Patient, states, that her foot is not completely healed, since that time.  Patient denies chest pain, nausea, vomiting, diarrhea, headache, blurred vision, weakness, numbness, dizziness, or syncope.  She, states she's had some chills, with a possible fever Past Medical History  Diagnosis Date  . Cellulitis   . Diabetes mellitus   . PE (pulmonary embolism) ~2007-2008    Not on anticoagulation   Past Surgical History  Procedure Laterality Date  . Toe amputation      last 2 on L foot  . Surgery to remove hematoma in l leg    . Abscess removal from l groin    . I&d extremity  11/12/2011    Procedure: IRRIGATION AND DEBRIDEMENT EXTREMITY;  Surgeon: Kathryne Hitch, MD;  Location: [facility];  Service: Orthopedics;  Laterality: Left;  foot left  . I&d extremity Left 09/06/2012    Procedure: IRRIGATION AND DEBRIDEMENT EXTREMITY;  Surgeon: Eldred Manges, MD;  Location: [facility];  Service: Orthopedics;  Laterality: Left;   Family History  Problem Relation Age of Onset  . Diabetes Mother   . Coronary artery disease Mother    History  Substance Use Topics  . Smoking status: Never Smoker   . Smokeless tobacco: Never Used  . Alcohol Use: No   OB History   Grav Para Term Preterm Abortions TAB SAB Ect  Mult Living                 Review of Systems All other systems negative except as documented in the HPI. All pertinent positives and negatives as reviewed in the HPI. Allergies  Zofran; Doxycycline; and Morphine and related  Home Medications  No current outpatient prescriptions on file. BP 163/80  Pulse 85  Temp(Src) 99.1 F (37.3 C) (Oral)  Resp 20  Ht 5' 8.11" (1.73 m)  Wt 376 lb 5.2 oz (170.7 kg)  BMI 57.03 kg/m2  SpO2 94%  LMP 04/13/2013 Physical Exam  Constitutional: She is oriented to person, place, and time. She appears well-developed and well-nourished. No distress.  HENT:  Head: Normocephalic and atraumatic.  Eyes: Pupils are equal, round, and reactive to light.  Cardiovascular: Normal rate, regular rhythm and normal heart sounds.  Exam reveals no gallop and no friction rub.   No murmur heard. Pulmonary/Chest: Effort normal and breath sounds normal. No respiratory distress. She has no wheezes.  Musculoskeletal:       Feet:  Neurological: She is alert and oriented to person, place, and time.  Skin: Skin is warm and dry. No rash noted. No erythema.    ED Course  Procedures (including critical care time) Labs Review Labs Reviewed  CBC WITH DIFFERENTIAL - Abnormal; Notable for the following:    Platelets 149 (*)    Neutrophils Relative % 78 (*)  All other components within normal limits  BASIC METABOLIC PANEL - Abnormal; Notable for the following:    Sodium 124 (*)    Potassium 3.4 (*)    Chloride 88 (*)    Glucose, Bld 411 (*)    All other components within normal limits  GLUCOSE, CAPILLARY - Abnormal; Notable for the following:    Glucose-Capillary 388 (*)    All other components within normal limits  HEMOGLOBIN A1C - Abnormal; Notable for the following:    Hemoglobin A1C 13.0 (*)    Mean Plasma Glucose 326 (*)    All other components within normal limits  BASIC METABOLIC PANEL - Abnormal; Notable for the following:    Sodium 128 (*)    Potassium  3.1 (*)    Chloride 94 (*)    Glucose, Bld 306 (*)    All other components within normal limits  CBC - Abnormal; Notable for the following:    Hemoglobin 11.6 (*)    HCT 33.3 (*)    Platelets 147 (*)    All other components within normal limits  SEDIMENTATION RATE - Abnormal; Notable for the following:    Sed Rate 100 (*)    All other components within normal limits  C-REACTIVE PROTEIN - Abnormal; Notable for the following:    CRP 33.9 (*)    All other components within normal limits  GLUCOSE, CAPILLARY - Abnormal; Notable for the following:    Glucose-Capillary 337 (*)    All other components within normal limits  GLUCOSE, CAPILLARY - Abnormal; Notable for the following:    Glucose-Capillary 350 (*)    All other components within normal limits  GLUCOSE, CAPILLARY - Abnormal; Notable for the following:    Glucose-Capillary 287 (*)    All other components within normal limits  GLUCOSE, CAPILLARY - Abnormal; Notable for the following:    Glucose-Capillary 278 (*)    All other components within normal limits  GLUCOSE, CAPILLARY - Abnormal; Notable for the following:    Glucose-Capillary 294 (*)    All other components within normal limits  GLUCOSE, CAPILLARY - Abnormal; Notable for the following:    Glucose-Capillary 323 (*)    All other components within normal limits  GLUCOSE, CAPILLARY - Abnormal; Notable for the following:    Glucose-Capillary 272 (*)    All other components within normal limits  GLUCOSE, CAPILLARY - Abnormal; Notable for the following:    Glucose-Capillary 335 (*)    All other components within normal limits  CULTURE, ROUTINE-ABSCESS  GRAM STAIN  GRAM STAIN  ANAEROBIC CULTURE  WOUND CULTURE  TISSUE CULTURE  ANAEROBIC CULTURE  APTT  PROTIME-INR  LIPID PANEL  COMPREHENSIVE METABOLIC PANEL  CBC WITH DIFFERENTIAL  MAGNESIUM   Imaging Review Dg Foot Complete Left  04/30/2013   CLINICAL DATA:  Nonhealing wound  EXAM: LEFT FOOT - COMPLETE 3+ VIEW   COMPARISON:  09/02/2012  FINDINGS: The 4th and 5th metatarsals and digits have been amputated. The base of the 4th and 5th metatarsals remain intact, with diffuse sclerosis. There has been increasing erosion at the tips of these bones since the prior study. . There is also slight widening between the base of the 2nd metatarsal and medial cuneiform. Marked soft tissue swelling at the amputation site is noted.  IMPRESSION: There is soft tissue swelling about the 4th and 5th metatarsal amputation site. Slightly increased erosion of the distal aspect of the remainder of the bone may represent developing osteomyelitis.   Electronically Signed  By: Maryclare Bean M.D.   On: 04/30/2013 18:06    EKG Interpretation     Ventricular Rate:    PR Interval:    QRS Duration:   QT Interval:    QTC Calculation:   R Axis:     Text Interpretation:              MDM   1. Cellulitis of foot, left   2. Diabetic foot ulcer   3. DM (diabetes mellitus), type 2, uncontrolled with complications   4. Hyponatremia   5. Obesity, Class III, BMI 40-49.9 (morbid obesity)   6. Osteomyelitis   7. Diabetic peripheral neuropathy associated with type 2 diabetes mellitus   8. Hypokalemia    Patient be admitted to the hospital for further evaluation and care.  Patient started on antibiotics for possible early, osteomyelitis    Carlyle Dolly, PA-C 05/02/13 [phone]

## 2013-05-02 NOTE — Progress Notes (Signed)
[facility] will provide whatever home health services will be needed at d/c. MD will need to order them.  Algernon Huxley, RN BSN   [phone]

## 2013-05-02 NOTE — Evaluation (Signed)
Occupational Therapy Evaluation Patient Details Name: Karen Bennett MRN: [id number] DOB: January 07, 1976 Today's Date: 05/02/2013 Time: 1150-1209 OT Time Calculation (min): 19 min  OT Assessment / Plan / Recommendation History of present illness s/p I and D left foot abcess with VAC placement   Clinical Impression   Pt Presents to OT with decreased I with ADL activity s/p I and D l foot. Pt with decreased I with ADL activity this session due to pain and problems listed below. Pt will benefit from skilled OT to increase I with ADL activity to return to PLOF    OT Assessment  Patient needs continued OT Services                Frequency  Min 2X/week    Precautions / Restrictions Precautions Precautions: Fall Precaution Comments: DARCO shoe L  Restrictions Weight Bearing Restrictions: Yes LLE Weight Bearing: Partial weight bearing       ADL  Grooming: Set up Where Assessed - Grooming: Unsupported sitting Where Assessed - Upper Body Bathing: Unsupported sitting Lower Body Bathing: Moderate assistance Where Assessed - Lower Body Bathing: Supported sit to stand Upper Body Dressing: Minimal assistance Where Assessed - Upper Body Dressing: Supported sit to stand Lower Body Dressing: Moderate assistance Where Assessed - Lower Body Dressing: Supported sit to Pharmacist, hospital: Minimal assistance Toilet Transfer Method: Sit to stand Toileting - Clothing Manipulation and Hygiene: Minimal assistance Where Assessed - Toileting Clothing Manipulation and Hygiene: Sit to stand from 3-in-1 or toilet Transfers/Ambulation Related to ADLs: Pt has very limited help at home.      OT Diagnosis: Generalized weakness;Acute pain  OT Problem List: Decreased activity tolerance;Pain OT Treatment Interventions: Self-care/ADL training;Patient/family education;DME and/or AE instruction   OT Goals(Current goals can be found in the care plan section) Acute Rehab OT Goals Patient Stated Goal:  home OT Goal Formulation: With patient Time For Goal Achievement: 05/16/13 ADL Goals Pt Will Perform Grooming: with modified independence;standing Pt Will Perform Upper Body Bathing: with modified independence;sitting Pt Will Perform Lower Body Bathing: with modified independence;sit to/from stand Pt Will Perform Upper Body Dressing: with modified independence;sitting Pt Will Perform Lower Body Dressing: with modified independence;sit to/from stand Pt Will Transfer to Toilet: with modified independence;ambulating Pt Will Perform Toileting - Clothing Manipulation and hygiene: with modified independence;sit to/from stand  Visit Information  Last OT Received On: 05/02/13 Assistance Needed: +2 History of Present Illness: s/p I and D left foot abcess with VAC placement       Prior Functioning     Home Living Family/patient expects to be discharged to:: Private residence Living Arrangements: Alone Available Help at Discharge: Friend(s);Other (Comment) (friend works) Type of Home: Mobile home Home Access: Ramped entrance Home Layout: One level Home Equipment: None Additional Comments: pt states she will not have anyone to help her or check on her at home Prior Function Level of Independence: Independent Communication Communication: No difficulties         Vision/Perception Vision - History Patient Visual Report: No change from baseline   Cognition  Cognition Arousal/Alertness: Awake/alert Behavior During Therapy: WFL for tasks assessed/performed Overall Cognitive Status: Within Functional Limits for tasks assessed    Extremity/Trunk Assessment Upper Extremity Assessment Upper Extremity Assessment: Overall WFL for tasks assessed Lower Extremity Assessment Lower Extremity Assessment: LLE deficits/detail LLE Deficits / Details: limited ankle dorsiflexion due to pain, bandaging and soft tissue     Mobility Bed Mobility Bed Mobility: Supine to Sit Supine to Sit: 5:  Supervision;HOB elevated;With  rails Details for Bed Mobility Assistance: cues for technique Transfers Sit to Stand: 4: Min assist Stand to Sit: 4: Min assist Details for Transfer Assistance: cues for hand placement and wt shift           End of Session OT - End of Session Activity Tolerance: Patient tolerated treatment well Patient left: in chair;with call bell/phone within reach  GO     Arman Loy, [place]ung 05/02/2013, 12:19 PM

## 2013-05-02 NOTE — Progress Notes (Signed)
Clinical Social Work Department BRIEF PSYCHOSOCIAL ASSESSMENT 05/02/2013  Patient:  Karen Bennett, Karen Bennett     Account Number:  [id number]     Admit date:  04/30/2013  Clinical Social Worker:  Dennison Bulla  Date/Time:  05/02/2013 10:15 AM  Referred by:  Physician  Date Referred:  05/02/2013 Referred for  Psychosocial assessment   Other Referral:   Interview type:  Patient Other interview type:    PSYCHOSOCIAL DATA Living Status:  ALONE Admitted from facility:   Level of care:   Primary support name:  [name] Primary support relationship to patient:  FRIEND Degree of support available:   Adequate    CURRENT CONCERNS Current Concerns  Other - See comment   Other Concerns:   Application process and SNF placement    SOCIAL WORK ASSESSMENT / PLAN CSW received referral to complete psychosocial assessment and to assist with DC planning. CSW reviewed chart and met with patient at bedside. CSW introduced myself and explained role.    Patient reports she lives alone and plans to DC home. Patient reports that she will ask friends to assist her as needed. Patient states that she asked to see CSW because she needs assistance with food stamps application. Patient had completed forms but feared that they would not be accepted to the Department of Social Services soon enough if they were not faxed. CSW faxed information to DSS and provided patient with confirmation page for fax.    Patient reports no further needs but thanked CSW for assistance. CSW is signing off but available if further needs arise.   Assessment/plan status:  Psychosocial Support/Ongoing Assessment of Needs Other assessment/ plan:   Information/referral to community resources:   Department of Social Services assistance    PATIENT'S/FAMILY'S RESPONSE TO PLAN OF CARE: Patient alert and oriented. Patient engaged throughout assessment and thanked CSW for assistance. Patient aware to contact CSW if further needs arise.        [place], [place] [phone]

## 2013-05-02 NOTE — Progress Notes (Signed)
Brief Pharmacy Consult Note - Vancomycin Follow Up  Labs: vanc trough 9.3  A/P: Vanc trough subtherapeutic (goal 15-20). Will change vancomycin from 1500mg  IV q12 to 1250mg  IV q8 and recheck another trough prn.   Hessie Knows, PharmD, BCPS Pager [phone] 05/02/2013 8:51 PM

## 2013-05-02 NOTE — Progress Notes (Signed)
ANTIBIOTIC CONSULT NOTE - FOLLOW UP  Pharmacy Consult for Vancomycin / Zosyn Indication: Diabetic foot infection, osteo  Allergies  Allergen Reactions  . Zofran Itching and Nausea And Vomiting  . Doxycycline Nausea And Vomiting  . Morphine And Related Itching    Patient Measurements: Height: 5' 8.11" (173 cm) Weight: 376 lb 5.2 oz (170.7 kg) IBW/kg (Calculated) : 64.15  Vital Signs: Temp: 98.8 F (37.1 C) (10/10 0600) Temp src: Oral (10/10 0600) BP: 120/82 mmHg (10/10 0600) Pulse Rate: 84 (10/10 0600) Intake/Output from previous day: 10/09 0701 - 10/10 0700 In: 2100 [I.V.:900; IV Piggyback:1200] Out: 50 [Blood:50] Intake/Output from this shift: Total I/O In: 240 [P.O.:240] Out: -   Labs:  Recent Labs  04/30/13 1710 05/01/13 0527 05/02/13 0520  WBC 7.7 5.6 6.3  HGB 13.4 11.6* 10.6*  PLT 149* 147* 182  CREATININE 0.63 0.67 0.74   Estimated Creatinine Clearance: 165.5 ml/min (by C-G formula based on Cr of 0.74). No results found for this basename: VANCOTROUGH, VANCOPEAK, VANCORANDOM, GENTTROUGH, GENTPEAK, GENTRANDOM, TOBRATROUGH, TOBRAPEAK, TOBRARND, AMIKACINPEAK, AMIKACINTROU, AMIKACIN,  in the last 72 hours   Microbiology: Recent Results (from the past 720 hour(s))  CULTURE, ROUTINE-ABSCESS     Status: None   Collection Time    04/30/13 11:00 PM      Result Value Range Status   Specimen Description ABSCESS LEFT FOOT   Final   Special Requests NONE   Final   Gram Stain     Final   Value: RARE WBC PRESENT, PREDOMINANTLY PMN     NO SQUAMOUS EPITHELIAL CELLS SEEN     NO ORGANISMS SEEN     Performed at [facility]   Culture     Final   Value: NO GROWTH     Performed at [facility]   Report Status PENDING   Incomplete  WOUND CULTURE     Status: None   Collection Time    05/01/13  2:08 PM      Result Value Range Status   Specimen Description WOUND ABSCESS LEFT FOOT   Final   Special Requests PATIENT ON FOLLOWING VANCOMYCIN 1.5G ZOSYN  3.375G   Final   Gram Stain PENDING   Incomplete   Culture     Final   Value: NO GROWTH 1 DAY     Performed at [facility]   Report Status PENDING   Incomplete  GRAM STAIN     Status: None   Collection Time    05/01/13  2:08 PM      Result Value Range Status   Specimen Description WOUND ABSCESS LEFT FOOT   Final   Special Requests PATIENT ON FOLLOWING VANCOMYCIN 1.5G ZOSYN 3.375G   Final   Gram Stain     Final   Value: NO ORGANISMS SEEN     NO WBC SEEN     Gram Stain Report Called to,Read Back By and Verified With: EDGALL,E. AT 1618 ON 10.09.14 BY LOVE,T.   Report Status 05/01/2013 FINAL   Final  TISSUE CULTURE     Status: None   Collection Time    05/01/13  2:14 PM      Result Value Range Status   Specimen Description FOOT LEFT METATARSAL   Final   Special Requests PATIENT ON FOLLOWING VANCOMYCIN 1.5G ZOSYN 3.375G   Final   Gram Stain     Final   Value: NO WBC SEEN     FEW GRAM POSITIVE COCCI IN PAIRS  Performed by [facility]     Performed at [facility]   Culture     Final   Value: NO GROWTH 1 DAY     Performed at [facility]   Report Status PENDING   Incomplete  ANAEROBIC CULTURE     Status: None   Collection Time    05/01/13  2:14 PM      Result Value Range Status   Specimen Description FOOT LEFT METATARSAL   Final   Special Requests PATIENT ON FOLLOWING VANCOMYCIN 1.5G ZOSYN 3.375G   Final   Gram Stain     Final   Value: NO WBC SEEN     FEW GRAM POSITIVE COCCI IN PAIRS     Performed by [facility]     Performed at [facility]   Culture PENDING   Incomplete   Report Status PENDING   Incomplete  GRAM STAIN     Status: None   Collection Time    05/01/13  2:14 PM      Result Value Range Status   Specimen Description FOOT LEFT METATARSAL   Final   Special Requests PATIENT ON FOLLOWING VANCOMYCIN 1.5G ZOSYN 3.375G   Final   Gram Stain     Final   Value: NO WBC SEEN     FEW GRAM POSITIVE COCCI     Gram Stain  Report Called to,Read Back By and Verified With: EDGAL,E. AT 1617 ON 10.09.14 BY LOVE,T.   Report Status 05/01/2013 FINAL   Final    Anti-infectives: 10/8 >> Vanc >> 10/8 >> Zosyn >>  Assessment: 36 yoF, PMH significant for uncontrolled diabetes, MSSA bacteremia, and polymicrobial foot infections, admitted 10/8 with suspected osteomyelitis of the L toes. Pt has had an amputation of the 4th and 5th toes on the left foot in 2010.  Day #3 Vancomycin 2500mg  x 1, then 1500mg  IV q12h and Zosyn 3.375g IV q8h  S/p I&D on 10/9, cultures of tissue growing few GPC in pairs  Tm 99.1  WBC wnl  Renal function stable, CrCl >100 ml/min  Goal of Therapy:  Vancomycin trough level 15-20 mcg/ml  Plan:   Check Vancomycin trough tonight at 19:30  Continue Zosyn 3.375gm IV q8h (4hr extended infusions) Follow up renal function & cultures to narrow therapy  Loralee Pacas, PharmD, BCPS Pager: [phone] 05/02/2013,10:33 AM

## 2013-05-02 NOTE — Progress Notes (Signed)
CARE MANAGEMENT NOTE 05/02/2013  Patient:  Karen Bennett, Karen Bennett   Account Number:  [id number]  Date Initiated:  05/02/2013  Documentation initiated by:  [facility]  Subjective/Objective Assessment:   37 year old female admitted with osteomyelitis.     Action/Plan:   Will need HH services at d/c.   Anticipated DC Date:  05/05/2013   Anticipated DC Plan:  HOME W HOME HEALTH SERVICES  In-house referral  Clinical Social Worker      DC Planning Services  CM consult         Status of service:  In process, will continue to follow  Medicare Important Message given?  NA - LOS <3 / Initial given by admissions  Per UR Regulation:  Reviewed for med. necessity/level of care/duration of stay  Comments:  05/02/13 Algernon Huxley RN BSN [phone] Spoke with pt yesterday about noncomplaince, d/c planning, medication assistance and PCP. Pt does not have medical insurance hence the non complaince and is in the process of applying for medicaid and disability. She has appointments next week in regards to this. Pt stated she has a couple from her church that is willing to assist her with transportation to her appointments. I gave her information on [facility] and encouraged her to make an appointment to establish care and also for post hospitalization follow up. Pt has a wound vac on at this time, not sure she'll need it at d/c. Will continue to follow.

## 2013-05-03 LAB — CBC WITH DIFFERENTIAL/PLATELET
Basophils Absolute: 0 10*3/uL (ref 0.0–0.1)
Basophils Relative: 1 % (ref 0–1)
Eosinophils Relative: 2 % (ref 0–5)
HCT: 31 % — ABNORMAL LOW (ref 36.0–46.0)
Hemoglobin: 10.6 g/dL — ABNORMAL LOW (ref 12.0–15.0)
Lymphocytes Relative: 40 % (ref 12–46)
Lymphs Abs: 2.6 10*3/uL (ref 0.7–4.0)
MCV: 85.9 fL (ref 78.0–100.0)
Monocytes Absolute: 0.7 10*3/uL (ref 0.1–1.0)
Monocytes Relative: 10 % (ref 3–12)
Neutro Abs: 3 10*3/uL (ref 1.7–7.7)
RBC: 3.61 MIL/uL — ABNORMAL LOW (ref 3.87–5.11)
RDW: 12.4 % (ref 11.5–15.5)
WBC: 6.4 10*3/uL (ref 4.0–10.5)

## 2013-05-03 LAB — COMPREHENSIVE METABOLIC PANEL
Albumin: 2.2 g/dL — ABNORMAL LOW (ref 3.5–5.2)
Alkaline Phosphatase: 67 U/L (ref 39–117)
BUN: 7 mg/dL (ref 6–23)
Calcium: 9.1 mg/dL (ref 8.4–10.5)
Chloride: 96 mEq/L (ref 96–112)
Creatinine, Ser: 0.6 mg/dL (ref 0.50–1.10)
GFR calc Af Amer: >90 mL/min (ref >90)
Glucose, Bld: 267 mg/dL — ABNORMAL HIGH (ref 70–99)
Potassium: 3.6 mEq/L (ref 3.5–5.1)
Total Protein: 6.5 g/dL (ref 6.0–8.3)

## 2013-05-03 LAB — CULTURE, ROUTINE-ABSCESS

## 2013-05-03 LAB — WOUND CULTURE

## 2013-05-03 LAB — GLUCOSE, CAPILLARY
Glucose-Capillary: 242 mg/dL — ABNORMAL HIGH (ref 70–99)
Glucose-Capillary: 257 mg/dL — ABNORMAL HIGH (ref 70–99)
Glucose-Capillary: 260 mg/dL — ABNORMAL HIGH (ref 70–99)

## 2013-05-03 MED ORDER — INSULIN GLARGINE 100 UNIT/ML ~~LOC~~ SOLN
15.0000 [IU] | Freq: Every day | SUBCUTANEOUS | Status: DC
Start: 2013-05-04 — End: 2013-05-06
  Administered 2013-05-04 – 2013-05-05 (×2): 15 [IU] via SUBCUTANEOUS
  Filled 2013-05-03 (×3): qty 0.15

## 2013-05-03 MED ORDER — GABAPENTIN 300 MG PO CAPS
300.0000 mg | ORAL_CAPSULE | Freq: Two times a day (BID) | ORAL | Status: DC
  Administered 2013-05-03 – 2013-05-09 (×13): 300 mg via ORAL
  Filled 2013-05-03 (×15): qty 1

## 2013-05-03 MED ORDER — LIP MEDEX EX OINT
TOPICAL_OINTMENT | CUTANEOUS | Status: AC
  Filled 2013-05-03: qty 7

## 2013-05-03 NOTE — Progress Notes (Signed)
[facility] PROGRESS NOTE  [place] ZOX:[id number] DOB: 1976/06/30 DOA: 04/30/2013 PCP: No primary provider on file.  Assessment/Plan: 1. Osteomyelitis fourth/fifth left metatarsal; wound care per orthopedic surgery -Continue antibiotics per surgery  2. Diabetes type 2;, increased SSI to Resistant and increase  Lantus 15  unit QD, -Lipid panel; not within ADA guidelines (HDL low, TG= 520), started on Lipitor; will also probably require niacin -Hemoglobin A1c 04/30/2013= 13 -Discussed with patient the need to obtain control of her diabetes if she wishes her wounds to heal, and does not want to lose the remainder of her foot. -Spoke with NCM Concerning obtaining a waiver for patient to be placed on Match program again in order to obtain her medication   3. Hyponatremia; continue to monitor still slightly low.  -Normal saline has been stopped this point secondary to patient increasing consumption of fluid/food  4. Hypokalemia; resolved we'll continue to monitor daily   5. Infection ; CBC within normal limits -Continue antibiotic per surgery  6. HLD; continue Lipitor 40 mg QD  7. Phantom Pain; increase Neurontin to BID  8. Orthopedic Pain; Continue current pain regimen   Code Status:  Full Family Communication: Disposition Plan:    Consultants:  Orthopedic surgery  Procedures: 05/01/2013  INCISION AND DRAINAGE LEFT FOREFOOT ABCESS (Left) by Dr Kerrin Champagne (orthopedics) . Gram stain shows few gram-positive cocci 05/03/2013 Wound cultures; negative to date 05/03/2013  Antibiotics:  Zosyn 10/9>>  Vancomycin 10/9>>    HPI/Subjective: Karen Bennett is a 37 y.o. 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 In 10/2011 Dr. Doneen Poisson performed a repeat I and D of the Left foot. She has been extremely noncompliant with any followup with  orthopaedics and Dr. Annell Greening was on Call at the last ER presentation for her left foot in 08/2012 and preformed an I and D of a left foot lateral abscess. There are no office notes post op and it appears as though she did not return for follow up with Dr. Ophelia Charter after that  Procedure. There are no records of Dr. Ophelia Charter being contacted about increasing problems with the left foot this Week. Karen Bennett indicates that she called Dr. Ophelia Charter and a prescription for Keflex was called in for her. She presents With a two week history of increasing left foot swelling erythrema and pain. Had noticed intermittant chills, no fever. Seen in [facility] with normal WBC But definite left foot swelling erythrema and warmth consistent with a left Foot lateral abscess. Admitting MD describing as a grapefruit size swelling of the left lateral foot. xrays are concerning  For sclerotic changes of the distal resected ends of the 4th and 5th metatarsal amputation ends. 05/01/2013 just returned from surgery, positive pain, positive inability to void urine. 05/02/2013 states feeling better except for pain in foot which she describes as electric/scrapping. Would like Foley removed. TODAY reveals improved, decreased swelling, negative warmth to touch. Patient waiting comfortably in bed for surgeons to come and change wound VAC      Objective: Filed Vitals:   05/02/13 0600 05/02/13 1329 05/02/13 2104 05/03/13 0520  BP: 120/82 100/67 135/78 130/79  Pulse: 84 82 89 83  Temp: 98.8 F (37.1 C) 98.5 F (36.9 C) 98.4 F (36.9 C) 98.6 F (37 C)  TempSrc: Oral Oral Oral Oral  Resp: 20 20 18 17   Height:      Weight:  SpO2: 96% 97% 93% 95%    Intake/Output Summary (Last 24 hours) at 05/03/13 1246 Last data filed at 05/03/13 0428  Gross per 24 hour  Intake   1640 ml  Output    850 ml  Net    790 ml   Filed Weights   04/30/13 1856 04/30/13 2100  Weight: 163.295 kg (360 lb) 170.7 kg (376 lb 5.2 oz)    Exam:   General:  A./O. X4,  NAD  Cardiovascular: Regular rhythm and rate, negative murmurs rubs gallops   Respiratory: Good auscultation  Abdomen: Over the obese, plus bowel some  Musculoskeletal: Left foot wrapped in Ace bandage with wound VAC present over the remnants of fourth and fifth metatarsal. Left lower leg no longer warm to touch, swelling has visibly decreased   Data Reviewed: Basic Metabolic Panel:  Recent Labs Lab 04/30/13 1710 05/01/13 0527 05/02/13 0520 05/03/13 0605  NA 124* 128* 132* 130*  K 3.4* 3.1* 3.4* 3.6  CL 88* 94* 97 96  CO2 20 22 25 24   GLUCOSE 411* 306* 306* 267*  BUN 12 13 10 7   CREATININE 0.63 0.67 0.74 0.60  CALCIUM 9.2 8.7 8.7 9.1  MG  --   --  1.6 1.5   Liver Function Tests:  Recent Labs Lab 05/02/13 0520 05/03/13 0605  AST 15 10  ALT 15 13  ALKPHOS 63 67  BILITOT 0.6 0.6  PROT 6.4 6.5  ALBUMIN 2.1* 2.2*   No results found for this basename: LIPASE, AMYLASE,  in the last 168 hours No results found for this basename: AMMONIA,  in the last 168 hours CBC:  Recent Labs Lab 04/30/13 1710 05/01/13 0527 05/02/13 0520 05/03/13 0605  WBC 7.7 5.6 6.3 6.4  NEUTROABS 6.0  --  3.4 3.0  HGB 13.4 11.6* 10.6* 10.6*  HCT 38.0 33.3* 31.7* 31.0*  MCV 83.7 84.1 86.1 85.9  PLT 149* 147* 182 232   Cardiac Enzymes: No results found for this basename: CKTOTAL, CKMB, CKMBINDEX, TROPONINI,  in the last 168 hours BNP (last 3 results) No results found for this basename: PROBNP,  in the last 8760 hours CBG:  Recent Labs Lab 05/02/13 2103 05/03/13 0024 05/03/13 0517 05/03/13 0743 05/03/13 1126  GLUCAP 267* 210* 224* 242* 257*    Recent Results (from the past 240 hour(s))  CULTURE, ROUTINE-ABSCESS     Status: None   Collection Time    04/30/13 11:00 PM      Result Value Range Status   Specimen Description ABSCESS LEFT FOOT   Final   Special Requests NONE   Final   Gram Stain     Final   Value: RARE WBC PRESENT, PREDOMINANTLY PMN     NO SQUAMOUS EPITHELIAL  CELLS SEEN     NO ORGANISMS SEEN     Performed at [facility]   Culture     Final   Value: RARE GROUP B STREP(S.AGALACTIAE)ISOLATED     Note: TESTING AGAINST S. AGALACTIAE NOT ROUTINELY PERFORMED DUE TO PREDICTABILITY OF AMP/PEN/VAN SUSCEPTIBILITY.     Performed at [facility]   Report Status 05/03/2013 FINAL   Final  ANAEROBIC CULTURE     Status: None   Collection Time    05/01/13  2:08 PM      Result Value Range Status   Specimen Description WOUND ABSCESS LEFT FOOT   Final   Special Requests PATIENT ON FOLLOWING VANCOMYCIN 1.5G ZOSYN 3.375G   Final   Gram Stain  Final   Value: RARE WBC PRESENT,BOTH PMN AND MONONUCLEAR     NO SQUAMOUS EPITHELIAL CELLS SEEN     RARE GRAM POSITIVE COCCI     IN PAIRS     Performed at [facility]   Culture     Final   Value: NO ANAEROBES ISOLATED; CULTURE IN PROGRESS FOR 5 DAYS     Performed at [facility]   Report Status PENDING   Incomplete  WOUND CULTURE     Status: None   Collection Time    05/01/13  2:08 PM      Result Value Range Status   Specimen Description WOUND ABSCESS LEFT FOOT   Final   Special Requests PATIENT ON FOLLOWING VANCOMYCIN 1.5G ZOSYN 3.375G   Final   Gram Stain     Final   Value: RARE WBC PRESENT,BOTH PMN AND MONONUCLEAR     NO SQUAMOUS EPITHELIAL CELLS SEEN     RARE GRAM POSITIVE COCCI     IN PAIRS     Performed at [facility]   Culture     Final   Value: NO GROWTH 1 DAY     Performed at [facility]   Report Status PENDING   Incomplete  GRAM STAIN     Status: None   Collection Time    05/01/13  2:08 PM      Result Value Range Status   Specimen Description WOUND ABSCESS LEFT FOOT   Final   Special Requests PATIENT ON FOLLOWING VANCOMYCIN 1.5G ZOSYN 3.375G   Final   Gram Stain     Final   Value: NO ORGANISMS SEEN     NO WBC SEEN     Gram Stain Report Called to,Read Back By and Verified With: EDGALL,E. AT 1618 ON 10.09.14 BY LOVE,T.   Report Status 05/01/2013  FINAL   Final  TISSUE CULTURE     Status: None   Collection Time    05/01/13  2:14 PM      Result Value Range Status   Specimen Description FOOT LEFT METATARSAL   Final   Special Requests PATIENT ON FOLLOWING VANCOMYCIN 1.5G ZOSYN 3.375G   Final   Gram Stain     Final   Value: NO WBC SEEN     NO ORGANISMS SEEN     Performed at [facility]     Final   Value: Culture reincubated for better growth     Performed at [facility]   Report Status PENDING   Incomplete  ANAEROBIC CULTURE     Status: None   Collection Time    05/01/13  2:14 PM      Result Value Range Status   Specimen Description FOOT LEFT METATARSAL   Final   Special Requests PATIENT ON FOLLOWING VANCOMYCIN 1.5G ZOSYN 3.375G   Final   Gram Stain     Final   Value: NO WBC SEEN     NO ORGANISMS SEEN     Performed at [facility]   Culture     Final   Value: NO ANAEROBES ISOLATED; CULTURE IN PROGRESS FOR 5 DAYS     Performed at [facility]   Report Status PENDING   Incomplete  GRAM STAIN     Status: None   Collection Time    05/01/13  2:14 PM      Result Value Range Status   Specimen Description FOOT LEFT METATARSAL   Final  Special Requests PATIENT ON FOLLOWING VANCOMYCIN 1.5G ZOSYN 3.375G   Final   Gram Stain     Final   Value: NO WBC SEEN     FEW GRAM POSITIVE COCCI     Gram Stain Report Called to,Read Back By and Verified With: EDGAL,E. AT 1617 ON 10.09.14 BY LOVE,T.   Report Status 05/01/2013 FINAL   Final     Studies: Ct Foot Left W Contrast  05/02/2013   CLINICAL DATA:  Persistent soft tissue swelling of the foot. Recent resection of soft tissue mass and of the base of the 5th metatarsal. Previous incision and drainage of abscess.  EXAM: CT OF THE LEFT FOOT WITH CONTRAST  TECHNIQUE: Multidetector CT imaging was performed following the standard protocol during bolus administration of intravenous contrast.  CONTRAST:  OMNIPAQUE IOHEXOL 300 MG/ML  SOLN   COMPARISON:  CT scan dated 09/03/2012 and radiographs dated 04/30/2013  FINDINGS: There are multiple soft tissue abscesses in the midfoot including around the stump of the 4th metatarsal. There is also fluid in the joints between the bases of the 1st and 2nd, 2nd and 3rd and 3rd and 4th metatarsals which I think is pus. There is a pus in the soft tissues along the plantar aspect of the proximal metatarsals. There is also pus in the plantar lateral aspect of the foot adjacent to the soft tissue resection. The area of involvement is approximately 4 cm in diameter.  There are new erosive changes of the basilar 2nd metatarsal since the CT scan of 09/03/2012 and there is increased irregularity of the bases of the 3rd and 4th metatarsals as well as of the medial and middle cuneiforms.  IMPRESSION: 1. Multiple soft tissue abscesses in around the tarsometatarsal joints. Findings consistent with septic joints between the proximal metatarsals. 2. New degenerative and erosive changes in several bones which could be arthritic but I suspect are more likely related to infection.   Electronically Signed   By: Geanie Cooley M.D.   On: 05/02/2013 11:14    Scheduled Meds: . atorvastatin  40 mg Oral q1800  . enoxaparin (LOVENOX) injection  30 mg Subcutaneous Q24H  . ferrous sulfate  325 mg Oral TID PC  . gabapentin  300 mg Oral QHS  . insulin aspart  0-20 Units Subcutaneous Q4H  . insulin glargine  8 Units Subcutaneous Daily  . piperacillin-tazobactam (ZOSYN)  IV  3.375 g Intravenous Q8H  . vancomycin  1,250 mg Intravenous Q8H   Continuous Infusions:   Principal Problem:   Diabetic foot ulcer Active Problems:   DM (diabetes mellitus), type 2, uncontrolled with complications   Obesity, Class III, BMI 40-49.9 (morbid obesity)   Hyponatremia   Osteomyelitis   Cellulitis and abscess of leg, except foot   Diabetic peripheral neuropathy associated with type 2 diabetes mellitus   Hypokalemia   HLD  (hyperlipidemia)    Time spent: 30 minutes    Alisyn Lequire, J  [facility] Pager [phone]. If 7PM-7AM, please contact night-coverage at [email or web address], password [facility] 05/03/2013, 12:46 PM  LOS: 3 days

## 2013-05-03 NOTE — Progress Notes (Signed)
In to see pt for a treatment session and she reports that he is not having any issues with toilet transfers or toileting clothing/hygiene management nor with BADLs--they just take me a little longer now. Her only concern was being able to get around in [employer] and the [employer] where she does most of her shopping. I called [employer] and they do have a motorized scooter which means she will only have to hobble around on the walker and [employer] shoe in [employer]. This news made her quite happy, no other concerns,will sign off due pt reports no other needs from an OT standpoint.  Ignacia Palma, OTR/L [phone] 05/03/2013

## 2013-05-04 LAB — COMPREHENSIVE METABOLIC PANEL
ALT: 11 U/L (ref 0–35)
AST: 10 U/L (ref 0–37)
Albumin: 2.4 g/dL — ABNORMAL LOW (ref 3.5–5.2)
Alkaline Phosphatase: 59 U/L (ref 39–117)
Potassium: 3.3 mEq/L — ABNORMAL LOW (ref 3.5–5.1)
Sodium: 131 mEq/L — ABNORMAL LOW (ref 135–145)
Total Protein: 6.7 g/dL (ref 6.0–8.3)

## 2013-05-04 LAB — CBC WITH DIFFERENTIAL/PLATELET
Basophils Absolute: 0 10*3/uL (ref 0.0–0.1)
Eosinophils Relative: 3 % (ref 0–5)
HCT: 30.4 % — ABNORMAL LOW (ref 36.0–46.0)
Lymphocytes Relative: 41 % (ref 12–46)
MCHC: 33.6 g/dL (ref 30.0–36.0)
MCV: 85.6 fL (ref 78.0–100.0)
Monocytes Absolute: 0.8 10*3/uL (ref 0.1–1.0)
Neutro Abs: 3.1 10*3/uL (ref 1.7–7.7)
Platelets: 274 10*3/uL (ref 150–400)
RDW: 12.3 % (ref 11.5–15.5)
WBC: 7 10*3/uL (ref 4.0–10.5)

## 2013-05-04 LAB — GLUCOSE, CAPILLARY
Glucose-Capillary: 192 mg/dL — ABNORMAL HIGH (ref 70–99)
Glucose-Capillary: 211 mg/dL — ABNORMAL HIGH (ref 70–99)
Glucose-Capillary: 230 mg/dL — ABNORMAL HIGH (ref 70–99)
Glucose-Capillary: 232 mg/dL — ABNORMAL HIGH (ref 70–99)
Glucose-Capillary: 242 mg/dL — ABNORMAL HIGH (ref 70–99)
Glucose-Capillary: 272 mg/dL — ABNORMAL HIGH (ref 70–99)

## 2013-05-04 LAB — TISSUE CULTURE: Gram Stain: NONE SEEN

## 2013-05-04 LAB — MAGNESIUM: Magnesium: 1.4 mg/dL — ABNORMAL LOW (ref 1.5–2.5)

## 2013-05-04 MED ORDER — MAGNESIUM SULFATE 40 MG/ML IJ SOLN
2.0000 g | Freq: Once | INTRAMUSCULAR | Status: DC
  Filled 2013-05-04: qty 50

## 2013-05-04 MED ORDER — POTASSIUM CHLORIDE 10 MEQ/100ML IV SOLN
10.0000 meq | INTRAVENOUS | Status: AC
  Filled 2013-05-04 (×2): qty 100

## 2013-05-04 NOTE — Progress Notes (Signed)
Vancomycin Consult  36 yoF with h/o uncontrolled diabetes, MSSA bacteremia, and polymicrobial foot infections admitted 10/8 with osteomyelitis of 4th and 5th L metatarsal.  Patient underwent bedside I&D by ortho the evening of 10/8 and I&D in the OR 10/9.   10/8 >> Vanc >> 10/8 >> Zosyn >>  Tmax: AFeb WBCs: wnl Renal: SCr stable 0.51, CrCl >100 N  10/8: abscess L foot >> rare Group B Strep 10/9: I&D tissue cx >> few Group B Strep   Patient is currently on D#5 Vancomycin 1250 mg IV q8h and Vancomycin trough returned therapeutic at 15.6 and renal function is stable/wnl.   Plan: Continue Vancomycin 1250 mg IV q8h.  Cultures are growing only Group B Strep.  MD, please consider narrowing antibiotics (stop Vanc/Zosyn, start Rocephin?).    Geoffry Paradise, PharmD, BCPS Pager: [phone] 8:48 PM Pharmacy #: [phone]

## 2013-05-04 NOTE — Progress Notes (Addendum)
[facility] PROGRESS NOTE  [place] ZOX:[id number] DOB: Jul 29, 1975 DOA: 04/30/2013 PCP: No primary provider on file.  Assessment/Plan: 1. Osteomyelitis fourth/fifth left metatarsal; wound care per orthopedic surgery -Continue antibiotics per surgery -Patient to start hydrotherapy to foot 05/05/2013 per orthopedic surgery  2. Diabetes type 2;, increased SSI to Resistant and increase  Lantus 15  unit QD, -Lipid panel; not within ADA guidelines (HDL low, TG= 520), started on Lipitor; will also probably require niacin -Hemoglobin A1c 04/30/2013= 13 -Discussed with patient the need to obtain control of her diabetes if she wishes her wounds to heal, and does not want to lose the remainder of her foot. -Spoke with NCM Concerning obtaining a waiver for patient to be placed on Match program again in order to obtain her medication   3. Hyponatremia; continue to monitor still slightly low.  -Normal saline has been stopped this point secondary to patient increasing consumption of fluid/food  4. Hypokalemia; slightly low will replace  5. Infection ; CBC within normal limits -Continue antibiotic per surgery  6. HLD; continue Lipitor 40 mg QD  7. Phantom Pain; continue Neurontin to BID  8. Orthopedic Pain; Continue current pain regimen   9. Hypomagnesemia; slightly low will replace   Code Status:  Full Family Communication: Disposition Plan:    Consultants:  Orthopedic surgery  Procedures: 05/01/2013  INCISION AND DRAINAGE LEFT FOREFOOT ABCESS (Left) by Dr Kerrin Champagne (orthopedics) . Gram stain shows few gram-positive cocci 05/03/2013 Wound cultures; negative to date 05/03/2013  Antibiotics:  Zosyn 10/9>>  Vancomycin 10/9>>    HPI/Subjective: Karen Bennett is a 37 y.o. 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 In 10/2011 Dr. Doneen Poisson  performed a repeat I and D of the Left foot. She has been extremely noncompliant with any followup with orthopaedics and Dr. Annell Greening was on Call at the last ER presentation for her left foot in 08/2012 and preformed an I and D of a left foot lateral abscess. There are no office notes post op and it appears as though she did not return for follow up with Dr. Ophelia Charter after that  Procedure. There are no records of Dr. Ophelia Charter being contacted about increasing problems with the left foot this Week. Ronna indicates that she called Dr. Ophelia Charter and a prescription for Keflex was called in for her. She presents With a two week history of increasing left foot swelling erythrema and pain. Had noticed intermittant chills, no fever. Seen in [facility] with normal WBC But definite left foot swelling erythrema and warmth consistent with a left Foot lateral abscess. Admitting MD describing as a grapefruit size swelling of the left lateral foot. xrays are concerning  For sclerotic changes of the distal resected ends of the 4th and 5th metatarsal amputation ends. 05/01/2013 just returned from surgery, positive pain, positive inability to void urine. 05/02/2013 states feeling better except for pain in foot which she describes as electric/scrapping. Would like Foley removed. 05/03/2013 reveals improved, decreased swelling, negative warmth to touch. Patient resting comfortably in bed for surgeons to come and change wound VAC TODAY states negative pain, and multiple questions on exactly what hydrotherapy entailed. No other complaints     Objective: Filed Vitals:   05/03/13 0520 05/03/13 1452 05/03/13 2216 05/04/13 0601  BP: 130/79 133/69 119/74 113/68  Pulse: 83 87 77 67  Temp: 98.6 F (37 C) 97.8 F (36.6 C) 98.6 F (  37 C) 97.8 F (36.6 C)  TempSrc: Oral Oral Oral Oral  Resp: 17 20 18 18   Height:      Weight:      SpO2: 95% 99% 97% 93%   No intake or output data in the 24 hours ending 05/04/13 0942 Filed Weights   04/30/13  1856 04/30/13 2100  Weight: 163.295 kg (360 lb) 170.7 kg (376 lb 5.2 oz)    Exam:   General:  A./O. X4, NAD  Cardiovascular: Regular rhythm and rate, negative murmurs rubs gallops   Respiratory: Good auscultation  Abdomen: Over the obese, plus bowel some  Musculoskeletal: Left foot wound VAC present over the remnants of fourth and fifth metatarsal. Negative warm to touch, positives swelling but significantly decreased from yesterday.    Data Reviewed: Basic Metabolic Panel:  Recent Labs Lab 04/30/13 1710 05/01/13 0527 05/02/13 0520 05/03/13 0605 05/04/13 0530  NA 124* 128* 132* 130* 131*  K 3.4* 3.1* 3.4* 3.6 3.3*  CL 88* 94* 97 96 98  CO2 20 22 25 24 22   GLUCOSE 411* 306* 306* 267* 262*  BUN 12 13 10 7 6   CREATININE 0.63 0.67 0.74 0.60 0.51  CALCIUM 9.2 8.7 8.7 9.1 9.4  MG  --   --  1.6 1.5 1.4*   Liver Function Tests:  Recent Labs Lab 05/02/13 0520 05/03/13 0605 05/04/13 0530  AST 15 10 10   ALT 15 13 11   ALKPHOS 63 67 59  BILITOT 0.6 0.6 0.4  PROT 6.4 6.5 6.7  ALBUMIN 2.1* 2.2* 2.4*   No results found for this basename: LIPASE, AMYLASE,  in the last 168 hours No results found for this basename: AMMONIA,  in the last 168 hours CBC:  Recent Labs Lab 04/30/13 1710 05/01/13 0527 05/02/13 0520 05/03/13 0605 05/04/13 0530  WBC 7.7 5.6 6.3 6.4 7.0  NEUTROABS 6.0  --  3.4 3.0 3.1  HGB 13.4 11.6* 10.6* 10.6* 10.2*  HCT 38.0 33.3* 31.7* 31.0* 30.4*  MCV 83.7 84.1 86.1 85.9 85.6  PLT 149* 147* 182 232 274   Cardiac Enzymes: No results found for this basename: CKTOTAL, CKMB, CKMBINDEX, TROPONINI,  in the last 168 hours BNP (last 3 results) No results found for this basename: PROBNP,  in the last 8760 hours CBG:  Recent Labs Lab 05/03/13 1656 05/03/13 2027 05/04/13 0006 05/04/13 0400 05/04/13 0713  GLUCAP 260* 252* 230* 232* 211*    Recent Results (from the past 240 hour(s))  CULTURE, ROUTINE-ABSCESS     Status: None   Collection Time     04/30/13 11:00 PM      Result Value Range Status   Specimen Description ABSCESS LEFT FOOT   Final   Special Requests NONE   Final   Gram Stain     Final   Value: RARE WBC PRESENT, PREDOMINANTLY PMN     NO SQUAMOUS EPITHELIAL CELLS SEEN     NO ORGANISMS SEEN     Performed at [facility]   Culture     Final   Value: RARE GROUP B STREP(S.AGALACTIAE)ISOLATED     Note: TESTING AGAINST S. AGALACTIAE NOT ROUTINELY PERFORMED DUE TO PREDICTABILITY OF AMP/PEN/VAN SUSCEPTIBILITY.     Performed at [facility]   Report Status 05/03/2013 FINAL   Final  ANAEROBIC CULTURE     Status: None   Collection Time    05/01/13  2:08 PM      Result Value Range Status   Specimen Description WOUND ABSCESS LEFT FOOT  Final   Special Requests PATIENT ON FOLLOWING VANCOMYCIN 1.5G ZOSYN 3.375G   Final   Gram Stain     Final   Value: RARE WBC PRESENT,BOTH PMN AND MONONUCLEAR     NO SQUAMOUS EPITHELIAL CELLS SEEN     RARE GRAM POSITIVE COCCI     IN PAIRS     Performed at [facility]   Culture     Final   Value: NO ANAEROBES ISOLATED; CULTURE IN PROGRESS FOR 5 DAYS     Performed at [facility]   Report Status PENDING   Incomplete  WOUND CULTURE     Status: None   Collection Time    05/01/13  2:08 PM      Result Value Range Status   Specimen Description WOUND ABSCESS LEFT FOOT   Final   Special Requests PATIENT ON FOLLOWING VANCOMYCIN 1.5G ZOSYN 3.375G   Final   Gram Stain     Final   Value: RARE WBC PRESENT,BOTH PMN AND MONONUCLEAR     NO SQUAMOUS EPITHELIAL CELLS SEEN     RARE GRAM POSITIVE COCCI     IN PAIRS     Performed at [facility]   Culture     Final   Value: FEW GROUP B STREP(S.AGALACTIAE)ISOLATED     Note: TESTING AGAINST S. AGALACTIAE NOT ROUTINELY PERFORMED DUE TO PREDICTABILITY OF AMP/PEN/VAN SUSCEPTIBILITY.     Performed at [facility]   Report Status 05/03/2013 FINAL   Final  GRAM STAIN     Status: None   Collection Time    05/01/13   2:08 PM      Result Value Range Status   Specimen Description WOUND ABSCESS LEFT FOOT   Final   Special Requests PATIENT ON FOLLOWING VANCOMYCIN 1.5G ZOSYN 3.375G   Final   Gram Stain     Final   Value: NO ORGANISMS SEEN     NO WBC SEEN     Gram Stain Report Called to,Read Back By and Verified With: EDGALL,E. AT 1618 ON 10.09.14 BY LOVE,T.   Report Status 05/01/2013 FINAL   Final  TISSUE CULTURE     Status: None   Collection Time    05/01/13  2:14 PM      Result Value Range Status   Specimen Description FOOT LEFT METATARSAL   Final   Special Requests PATIENT ON FOLLOWING VANCOMYCIN 1.5G ZOSYN 3.375G   Final   Gram Stain     Final   Value: NO WBC SEEN     NO ORGANISMS SEEN     Performed at [facility]     Final   Value: Culture reincubated for better growth     Performed at [facility]   Report Status PENDING   Incomplete  ANAEROBIC CULTURE     Status: None   Collection Time    05/01/13  2:14 PM      Result Value Range Status   Specimen Description FOOT LEFT METATARSAL   Final   Special Requests PATIENT ON FOLLOWING VANCOMYCIN 1.5G ZOSYN 3.375G   Final   Gram Stain     Final   Value: NO WBC SEEN     NO ORGANISMS SEEN     Performed at [facility]   Culture     Final   Value: NO ANAEROBES ISOLATED; CULTURE IN PROGRESS FOR 5 DAYS     Performed at [facility]   Report Status PENDING  Incomplete  GRAM STAIN     Status: None   Collection Time    05/01/13  2:14 PM      Result Value Range Status   Specimen Description FOOT LEFT METATARSAL   Final   Special Requests PATIENT ON FOLLOWING VANCOMYCIN 1.5G ZOSYN 3.375G   Final   Gram Stain     Final   Value: NO WBC SEEN     FEW GRAM POSITIVE COCCI     Gram Stain Report Called to,Read Back By and Verified With: EDGAL,E. AT 1617 ON 10.09.14 BY LOVE,T.   Report Status 05/01/2013 FINAL   Final     Studies: No results found.  Scheduled Meds: . atorvastatin  40 mg Oral q1800  .  enoxaparin (LOVENOX) injection  30 mg Subcutaneous Q24H  . ferrous sulfate  325 mg Oral TID PC  . gabapentin  300 mg Oral BID  . insulin aspart  0-20 Units Subcutaneous Q4H  . insulin glargine  15 Units Subcutaneous Daily  . piperacillin-tazobactam (ZOSYN)  IV  3.375 g Intravenous Q8H  . vancomycin  1,250 mg Intravenous Q8H   Continuous Infusions:   Principal Problem:   Diabetic foot ulcer Active Problems:   DM (diabetes mellitus), type 2, uncontrolled with complications   Obesity, Class III, BMI 40-49.9 (morbid obesity)   Hyponatremia   Osteomyelitis   Cellulitis and abscess of leg, except foot   Diabetic peripheral neuropathy associated with type 2 diabetes mellitus   Hypokalemia   HLD (hyperlipidemia)    Time spent: 30 minutes    WOODS, CURTIS, J  [facility] Pager [phone]. If 7PM-7AM, please contact night-coverage at [email or web address], password [facility] 05/04/2013, 9:42 AM  LOS: 4 days

## 2013-05-04 NOTE — Progress Notes (Signed)
Patient ID: Karen Bennett, female   DOB: Dec 22, 1975, 37 y.o.   MRN: [id number] I changed her left foot VAC at the bedside today.  The wound showed no gross purulence and good blleding tissue as well as good beefy red tissue.  I then placed a new VAC.  I did look at the CT scan of her foot which is quite difficult to interpret.  I think that given the CT findings, she would benefit from daily hydrotherapy to her left foot starting tomorrow 10/13.  Then a new VAC sponge daily.  This is an attempt to salvage her foot and hopefully get it ready for some type of skin graft later as the wound hopefully improves.

## 2013-05-04 NOTE — Progress Notes (Signed)
Physical Therapy Treatment Patient Details Name: Karen Bennett MRN: [id number] DOB: Sep 28, 1975 Today's Date: 05/04/2013 Time: 4010-2725 PT Time Calculation (min): 19 min  PT Assessment / Plan / Recommendation  History of Present Illness s/p I and D left foot abcess with VAC placement   PT Comments   Pt progressing; noted new orders for PT/hydrotherapy to start tomorrow; Will continue to follow;  Follow Up Recommendations  No PT follow up     Does the patient have the potential to tolerate intense rehabilitation     Barriers to Discharge        Equipment Recommendations  Rolling walker with 5" wheels    Recommendations for Other Services    Frequency Min 3X/week   Progress towards PT Goals Progress towards PT goals: Progressing toward goals  Plan Current plan remains appropriate    Precautions / Restrictions Precautions Precautions: Fall Precaution Comments: DARCO shoe L  Restrictions LLE Weight Bearing: Partial weight bearing Other Position/Activity Restrictions: pt verbalizes need for shoe and PWB   Pertinent Vitals/Pain Min c/o pain; pt reports her pain never gets below a 6/10   Mobility  Bed Mobility Bed Mobility: Supine to Sit Supine to Sit: 5: Supervision;HOB elevated Details for Bed Mobility Assistance: supervision for safety and line managment Transfers Transfers: Sit to Stand;Stand to Sit Sit to Stand: 4: Min guard;5: Supervision;From bed Stand to Sit: 4: Min guard;5: Supervision;To chair/3-in-1 Details for Transfer Assistance: cues for hand placement and wt shift; LOB upon initial stand and pt had to sit on EOB; 2 attempts  Ambulation/Gait Ambulation/Gait Assistance: 4: Min guard;5: Supervision Ambulation Distance (Feet): 68 Feet Assistive device: Rolling walker Ambulation/Gait Assistance Details: cues for sequence, PWB, RW safety; chair follow for safety/IV managaement Gait Pattern: Step-to pattern Gait velocity: decr    Exercises     PT  Diagnosis:    PT Problem List:   PT Treatment Interventions:     PT Goals (current goals can now be found in the care plan section) Acute Rehab PT Goals Patient Stated Goal: home Time For Goal Achievement: 05/09/13 Potential to Achieve Goals: Good  Visit Information  Last PT Received On: 05/04/13 Assistance Needed: +2 (for safety/chair) History of Present Illness: s/p I and D left foot abcess with VAC placement    Subjective Data  Subjective: ok, i can try Patient Stated Goal: home   Cognition  Cognition Arousal/Alertness: Awake/alert Behavior During Therapy: WFL for tasks assessed/performed Overall Cognitive Status: Within Functional Limits for tasks assessed    Balance     End of Session PT - End of Session Activity Tolerance: Patient tolerated treatment well Patient left: in chair;with call bell/phone within reach Nurse Communication: Mobility status   GP     [facility] 05/04/2013, 5:17 PM

## 2013-05-05 DIAGNOSIS — G547 Phantom limb syndrome without pain: Secondary | ICD-10-CM

## 2013-05-05 LAB — CBC WITH DIFFERENTIAL/PLATELET
Basophils Absolute: 0 10*3/uL (ref 0.0–0.1)
Basophils Relative: 0 % (ref 0–1)
Eosinophils Absolute: 0.2 10*3/uL (ref 0.0–0.7)
Eosinophils Relative: 3 % (ref 0–5)
Hemoglobin: 10.2 g/dL — ABNORMAL LOW (ref 12.0–15.0)
Lymphs Abs: 2.4 10*3/uL (ref 0.7–4.0)
MCH: 29.3 pg (ref 26.0–34.0)
MCHC: 33.9 g/dL (ref 30.0–36.0)
Monocytes Relative: 10 % (ref 3–12)
Neutrophils Relative %: 45 % (ref 43–77)
Platelets: 297 10*3/uL (ref 150–400)
RDW: 12.7 % (ref 11.5–15.5)

## 2013-05-05 LAB — COMPREHENSIVE METABOLIC PANEL
ALT: 13 U/L (ref 0–35)
AST: 19 U/L (ref 0–37)
Alkaline Phosphatase: 49 U/L (ref 39–117)
BUN: 6 mg/dL (ref 6–23)
CO2: 22 mEq/L (ref 19–32)
Chloride: 102 mEq/L (ref 96–112)
GFR calc Af Amer: >90 mL/min (ref >90)
GFR calc non Af Amer: >90 mL/min (ref >90)
Glucose, Bld: 223 mg/dL — ABNORMAL HIGH (ref 70–99)
Potassium: 3.4 mEq/L — ABNORMAL LOW (ref 3.5–5.1)
Sodium: 132 mEq/L — ABNORMAL LOW (ref 135–145)
Total Bilirubin: 0.4 mg/dL (ref 0.3–1.2)

## 2013-05-05 LAB — GLUCOSE, CAPILLARY
Glucose-Capillary: 203 mg/dL — ABNORMAL HIGH (ref 70–99)
Glucose-Capillary: 182 mg/dL — ABNORMAL HIGH (ref 70–99)
Glucose-Capillary: 185 mg/dL — ABNORMAL HIGH (ref 70–99)
Glucose-Capillary: 208 mg/dL — ABNORMAL HIGH (ref 70–99)

## 2013-05-05 MED ORDER — LIP MEDEX EX OINT
TOPICAL_OINTMENT | CUTANEOUS | Status: AC
  Administered 2013-05-05: 09:00:00
  Filled 2013-05-05: qty 7

## 2013-05-05 NOTE — Progress Notes (Signed)
CARE MANAGEMENT NOTE 05/05/2013  Patient:  Karen Bennett, Karen Bennett   Account Number:  [id number]  Date Initiated:  05/02/2013  Documentation initiated by:  [facility]  Subjective/Objective Assessment:   37 year old female admitted with osteomyelitis.     Action/Plan:   Will need HH services at d/c.   Anticipated DC Date:  05/08/2013   Anticipated DC Plan:  HOME W HOME HEALTH SERVICES  In-house referral  Clinical Social Worker      DC Planning Services  CM consult      Choice offered to / List presented to:            [facility] agency  [facility]   Status of service:  In process, will continue to follow  Medicare Important Message given?  NA - LOS <3 / Initial given by admissions  Per UR Regulation:  Reviewed for med. necessity/level of care/duration of stay   Comments:  05/05/13 Algernon Huxley RN BSN [phone] Pt is now getting daily hydrotherapy and vac changes. If these services are needs at d/c then home may not be an option. I have asked [facility] liasion to review for possible admission there, awaiting a call back.  05/02/13 Algernon Huxley RN BSN [phone] Spoke with pt yesterday about noncomplaince, d/c planning, medication assistance and PCP. Pt does not have medical insurance hence the non complaince and is in the process of applying for medicaid and disability. She has appointments next week in regards to this. Pt stated she has a couple from her church that is willing to assist her with transportation to her appointments. I gave her information on [facility] and encouraged her to make an appointment to establish care and also for post hospitalization follow up. Pt has a wound vac on at this time, not sure she'll need it at d/c. Will continue to follow.  [facility] will provide home health services at d/c pending MD orders.

## 2013-05-05 NOTE — Progress Notes (Signed)
[facility] PROGRESS NOTE  Karen Bennett JXB:[id number] DOB: 12/16/1975 DOA: 04/30/2013 PCP: No primary provider on file.  Assessment/Plan: 1. Osteomyelitis fourth/fifth left metatarsal; wound care per orthopedic surgery -Continue antibiotics per surgery -First round of bedside hydrotherapy to left foot went well per patient; per orthopedic surgery patient will   receive multiple rounds prior to discharge  2. Diabetes type 2;, increased SSI to Resistant and increase  Lantus 15  unit QD, -Hemoglobin A1c 04/30/2013= 13 -Discussed with patient the need to obtain control of her diabetes if she wishes her wounds to heal, and does not want to lose the remainder of her foot. -Spoke with NCM Concerning obtaining a waiver for patient to be placed on Match program again in order to obtain her medication   3. Hyponatremia; continue to monitor still slightly low.  -Normal saline has been stopped this point secondary to patient increasing consumption of fluid/food  4. Hypokalemia; slightly low will replace  5. Infection ; CBC within normal limits -Continue antibiotic per surgery  6. HLD; -Lipid panel; not within ADA guidelines (HDL low, TG= 520), started on Lipitor 40 mg; will also     probably require niacin  7. Phantom Pain; continue Neurontin to BID  8. Orthopedic Pain; Continue current pain regimen   9. Hypomagnesemia; slightly low will replace   Code Status:  Full Family Communication: Disposition Plan: Per surgery   Consultants:  Orthopedic surgery  Procedures: 05/01/2013  INCISION AND DRAINAGE LEFT FOREFOOT ABCESS (Left) by Dr Kerrin Champagne (orthopedics) . Gram stain shows few gram-positive cocci 05/03/2013 Wound cultures; negative to date 05/03/2013   Antibiotics:  Zosyn 10/9>>  Vancomycin 10/9>>    HPI/Subjective: Karen Bennett is a 37 y.o. 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 In 10/2011 Dr. Doneen Poisson performed a repeat I and D of the Left foot. She has been extremely noncompliant with any followup with orthopaedics and Dr. Annell Greening was on Call at the last ER presentation for her left foot in 08/2012 and preformed an I and D of a left foot lateral abscess. There are no office notes post op and it appears as though she did not return for follow up with Dr. Ophelia Charter after that  Procedure. There are no records of Dr. Ophelia Charter being contacted about increasing problems with the left foot this Week. Karen Bennett indicates that she called Dr. Ophelia Charter and a prescription for Keflex was called in for her. She presents With a two week history of increasing left foot swelling erythrema and pain. Had noticed intermittant chills, no fever. Seen in [facility] with normal WBC But definite left foot swelling erythrema and warmth consistent with a left Foot lateral abscess. Admitting MD describing as a grapefruit size swelling of the left lateral foot. xrays are concerning  For sclerotic changes of the distal resected ends of the 4th and 5th metatarsal amputation ends. 05/01/2013 just returned from surgery, positive pain, positive inability to void urine. 05/02/2013 states feeling better except for pain in foot which she describes as electric/scrapping. Would like Foley removed. 05/03/2013 reveals improved, decreased swelling, negative warmth to touch. Patient resting comfortably in bed for surgeons to come and change wound VAC 05/04/2013 states negative pain, and multiple questions on exactly what hydrotherapy entailed. No other complaints. TODAY states that initially anxious about the hydrotherapy for her left foot but after completed felt well.     Objective: Filed  Vitals:   05/04/13 0601 05/04/13 1507 05/04/13 2159 05/05/13 0614  BP: 113/68 167/72 152/78 107/57  Pulse: 67 69 76 71  Temp: 97.8 F (36.6 C) 98.3 F (36.8 C) 98.6 F (37 C) 98 F (36.7 C)  TempSrc:  Oral Oral Oral Oral  Resp: 18 20 18 18   Height:      Weight:      SpO2: 93% 95% 97% 97%    Intake/Output Summary (Last 24 hours) at 05/05/13 1646 Last data filed at 05/05/13 1200  Gross per 24 hour  Intake   2520 ml  Output      0 ml  Net   2520 ml   Filed Weights   04/30/13 1856 04/30/13 2100  Weight: 163.295 kg (360 lb) 170.7 kg (376 lb 5.2 oz)    Exam:   General:  A./O. X4, NAD  Cardiovascular: Regular rhythm and rate, negative murmurs rubs gallops   Respiratory: Good auscultation  Abdomen: Over the obese, plus bowel some  Musculoskeletal: Left foot wound VAC present over the remnants of fourth and fifth metatarsal. Negative warm to touch, positives swelling but continues to decrease .    Data Reviewed: Basic Metabolic Panel:  Recent Labs Lab 05/01/13 0527 05/02/13 0520 05/03/13 0605 05/04/13 0530 05/05/13 0550  NA 128* 132* 130* 131* 132*  K 3.1* 3.4* 3.6 3.3* 3.4*  CL 94* 97 96 98 102  CO2 22 25 24 22 22   GLUCOSE 306* 306* 267* 262* 223*  BUN 13 10 7 6 6   CREATININE 0.67 0.74 0.60 0.51 0.74  CALCIUM 8.7 8.7 9.1 9.4 9.1  MG  --  1.6 1.5 1.4* 1.5   Liver Function Tests:  Recent Labs Lab 05/02/13 0520 05/03/13 0605 05/04/13 0530 05/05/13 0550  AST 15 10 10 19   ALT 15 13 11 13   ALKPHOS 63 67 59 49  BILITOT 0.6 0.6 0.4 0.4  PROT 6.4 6.5 6.7 6.4  ALBUMIN 2.1* 2.2* 2.4* 2.3*   No results found for this basename: LIPASE, AMYLASE,  in the last 168 hours No results found for this basename: AMMONIA,  in the last 168 hours CBC:  Recent Labs Lab 04/30/13 1710 05/01/13 0527 05/02/13 0520 05/03/13 0605 05/04/13 0530 05/05/13 0550  WBC 7.7 5.6 6.3 6.4 7.0 5.8  NEUTROABS 6.0  --  3.4 3.0 3.1 2.7  HGB 13.4 11.6* 10.6* 10.6* 10.2* 10.2*  HCT 38.0 33.3* 31.7* 31.0* 30.4* 30.1*  MCV 83.7 84.1 86.1 85.9 85.6 86.5  PLT 149* 147* 182 232 274 297   Cardiac Enzymes: No results found for this basename: CKTOTAL, CKMB, CKMBINDEX, TROPONINI,  in the last  168 hours BNP (last 3 results) No results found for this basename: PROBNP,  in the last 8760 hours CBG:  Recent Labs Lab 05/04/13 2026 05/04/13 2354 05/05/13 0410 05/05/13 0711 05/05/13 1154  GLUCAP 261* 242* 203* 182* 185*    Recent Results (from the past 240 hour(s))  CULTURE, ROUTINE-ABSCESS     Status: None   Collection Time    04/30/13 11:00 PM      Result Value Range Status   Specimen Description ABSCESS LEFT FOOT   Final   Special Requests NONE   Final   Gram Stain     Final   Value: RARE WBC PRESENT, PREDOMINANTLY PMN     NO SQUAMOUS EPITHELIAL CELLS SEEN     NO ORGANISMS SEEN     Performed at [facility]     Final  Value: RARE GROUP B STREP(S.AGALACTIAE)ISOLATED     Note: TESTING AGAINST S. AGALACTIAE NOT ROUTINELY PERFORMED DUE TO PREDICTABILITY OF AMP/PEN/VAN SUSCEPTIBILITY.     Performed at [facility]   Report Status 05/03/2013 FINAL   Final  ANAEROBIC CULTURE     Status: None   Collection Time    05/01/13  2:08 PM      Result Value Range Status   Specimen Description WOUND ABSCESS LEFT FOOT   Final   Special Requests PATIENT ON FOLLOWING VANCOMYCIN 1.5G ZOSYN 3.375G   Final   Gram Stain     Final   Value: RARE WBC PRESENT,BOTH PMN AND MONONUCLEAR     NO SQUAMOUS EPITHELIAL CELLS SEEN     RARE GRAM POSITIVE COCCI     IN PAIRS     Performed at [facility]   Culture     Final   Value: NO ANAEROBES ISOLATED; CULTURE IN PROGRESS FOR 5 DAYS     Performed at [facility]   Report Status PENDING   Incomplete  WOUND CULTURE     Status: None   Collection Time    05/01/13  2:08 PM      Result Value Range Status   Specimen Description WOUND ABSCESS LEFT FOOT   Final   Special Requests PATIENT ON FOLLOWING VANCOMYCIN 1.5G ZOSYN 3.375G   Final   Gram Stain     Final   Value: RARE WBC PRESENT,BOTH PMN AND MONONUCLEAR     NO SQUAMOUS EPITHELIAL CELLS SEEN     RARE GRAM POSITIVE COCCI     IN PAIRS     Performed at  [facility]   Culture     Final   Value: FEW GROUP B STREP(S.AGALACTIAE)ISOLATED     Note: TESTING AGAINST S. AGALACTIAE NOT ROUTINELY PERFORMED DUE TO PREDICTABILITY OF AMP/PEN/VAN SUSCEPTIBILITY.     Performed at [facility]   Report Status 05/03/2013 FINAL   Final  GRAM STAIN     Status: None   Collection Time    05/01/13  2:08 PM      Result Value Range Status   Specimen Description WOUND ABSCESS LEFT FOOT   Final   Special Requests PATIENT ON FOLLOWING VANCOMYCIN 1.5G ZOSYN 3.375G   Final   Gram Stain     Final   Value: NO ORGANISMS SEEN     NO WBC SEEN     Gram Stain Report Called to,Read Back By and Verified With: EDGALL,E. AT 1618 ON 10.09.14 BY LOVE,T.   Report Status 05/01/2013 FINAL   Final  TISSUE CULTURE     Status: None   Collection Time    05/01/13  2:14 PM      Result Value Range Status   Specimen Description FOOT LEFT METATARSAL   Final   Special Requests PATIENT ON FOLLOWING VANCOMYCIN 1.5G ZOSYN 3.375G   Final   Gram Stain     Final   Value: NO WBC SEEN     NO ORGANISMS SEEN     Performed at [facility]   Culture     Final   Value: FEW GROUP B STREP(S.AGALACTIAE)ISOLATED     Note: TESTING AGAINST S. AGALACTIAE NOT ROUTINELY PERFORMED DUE TO PREDICTABILITY OF AMP/PEN/VAN SUSCEPTIBILITY.     Performed at [facility]   Report Status 05/04/2013 FINAL   Final  ANAEROBIC CULTURE     Status: None   Collection Time    05/01/13  2:14 PM  Result Value Range Status   Specimen Description FOOT LEFT METATARSAL   Final   Special Requests PATIENT ON FOLLOWING VANCOMYCIN 1.5G ZOSYN 3.375G   Final   Gram Stain     Final   Value: NO WBC SEEN     NO ORGANISMS SEEN     Performed at [facility]   Culture     Final   Value: NO ANAEROBES ISOLATED; CULTURE IN PROGRESS FOR 5 DAYS     Performed at [facility]   Report Status PENDING   Incomplete  GRAM STAIN     Status: None   Collection Time    05/01/13  2:14 PM       Result Value Range Status   Specimen Description FOOT LEFT METATARSAL   Final   Special Requests PATIENT ON FOLLOWING VANCOMYCIN 1.5G ZOSYN 3.375G   Final   Gram Stain     Final   Value: NO WBC SEEN     FEW GRAM POSITIVE COCCI     Gram Stain Report Called to,Read Back By and Verified With: EDGAL,E. AT 1617 ON 10.09.14 BY LOVE,T.   Report Status 05/01/2013 FINAL   Final  MRSA PCR SCREENING     Status: None   Collection Time    05/04/13  8:18 AM      Result Value Range Status   MRSA by PCR NEGATIVE  NEGATIVE Final   Comment:            The GeneXpert MRSA Assay (FDA     approved for NASAL specimens     only), is one component of a     comprehensive MRSA colonization     surveillance program. It is not     intended to diagnose MRSA     infection nor to guide or     monitor treatment for     MRSA infections.     Studies: No results found.  Scheduled Meds: . atorvastatin  40 mg Oral q1800  . enoxaparin (LOVENOX) injection  30 mg Subcutaneous Q24H  . ferrous sulfate  325 mg Oral TID PC  . gabapentin  300 mg Oral BID  . insulin aspart  0-20 Units Subcutaneous Q4H  . insulin glargine  15 Units Subcutaneous Daily  . magnesium sulfate 1 - 4 g bolus IVPB  2 g Intravenous Once  . piperacillin-tazobactam (ZOSYN)  IV  3.375 g Intravenous Q8H  . vancomycin  1,250 mg Intravenous Q8H   Continuous Infusions:   Principal Problem:   Diabetic foot ulcer Active Problems:   DM (diabetes mellitus), type 2, uncontrolled with complications   Obesity, Class III, BMI 40-49.9 (morbid obesity)   Hyponatremia   Osteomyelitis   Cellulitis and abscess of leg, except foot   Diabetic peripheral neuropathy associated with type 2 diabetes mellitus   Hypokalemia   HLD (hyperlipidemia)   Hypomagnesemia    Time spent: 30 minutes    WOODS, CURTIS, J  [facility] Pager [phone]. If 7PM-7AM, please contact night-coverage at [email or web address], password [facility] 05/05/2013, 4:46 PM  LOS: 5 days

## 2013-05-05 NOTE — Consult Note (Signed)
WOC wound consult note Reason for Consult: Wound care provided in concert with PT and Hydrotherapy Wound type:neuropathic ulceration Pressure Ulcer POA: No Measurement: 8.5cm x 8.0 cm x 2.5cm Wound bed: 80% red, granulating tissue, 20% black necrotic tissue from 9-12 o'clock.   Drainage (amount, consistency, odor) serosanguinous drainage in old cannister, no exudate noted from wound upon dressing removal Periwound: Maceration from 12-2 o'clock, fluid filled blister with yellow base noted from 9-12 o'clock corresponding with necrotic tissue. Dressing procedure/placement/frequency: Small, single piece of NPWT back foam applied to wound bed and draped to form seal.  applied and suction activated.  Noted is MD POC for this procedure (hydrotherapy and NPWT application) to be performed daily. WOC nursing team will follow for these services. Thanks, Ladona Mow, MSN, RN, GNP, [place], CWON-AP [phone])

## 2013-05-05 NOTE — Progress Notes (Signed)
Inpatient Diabetes Program Recommendations  AACE/ADA: New Consensus Statement on Inpatient Glycemic Control (2013)  Target Ranges:  Prepandial:   less than 140 mg/dL      Peak postprandial:   less than 180 mg/dL (1-2 hours)      Critically ill patients:  140 - 180 mg/dL   Reason for Visit: Uncontrolled DM  37 yo female diabetic not taking any meds due to lack of health insurance comes in with several days of worsening swelling and redness to left foot. Pt s/p toe amputation earlier this year with dr Ophelia Charter. Last hospital admission, pt was discharged on 70/30 30 units bid. Also has been given info on [facility] and [facility] for f/u on glycemic control.  Results for Karen Bennett, Karen Bennett (MRN [id number]) as of 05/05/2013 12:14  Ref. Range 05/04/2013 07:13 05/04/2013 11:29 05/04/2013 16:40 05/04/2013 20:26 05/04/2013 23:54 05/05/2013 04:10 05/05/2013 07:11 05/05/2013 11:54  Glucose-Capillary Latest Range: 70-99 mg/dL 147 (H) 829 (H) 562 (H) 261 (H) 242 (H) 203 (H) 182 (H) 185 (H)  Results for Karen Bennett, Karen Bennett (MRN [id number]) as of 05/05/2013 12:14  Ref. Range 09/03/2012 05:05 04/30/2013 17:10  Hemoglobin A1C Latest Range: <5.7 % 16.8 (H) 13.0 (H)   Received Novolog 40 units and Lantus 15 units on 10/12.  Recommendations: D/C Lantus and begin 70/30 20 units bid. Titrate until blood sugars are <180 mg/dL. Novolog resistant tidwc and hs. (Does not need Q4 since eating)  Obviously pt takes in more calories at home than here in hospital. Would probably send home on Humulin 70/30 30 units bid and Regular insulin S/S. These insulins can be purchased for $24.88 at [facility]. Needs f/u with PCP ([facility] and [facility])  Will continue to follow. Thank you. Ailene Ards, RD, LDN, CDE Inpatient Diabetes Coordinator [phone]

## 2013-05-05 NOTE — Progress Notes (Signed)
PT HYDROTHERAPY EVAL    05/05/13 1702  Subjective Assessment  Subjective I am scared.  (Pt comforted with explaination before, during & after sessio)  Patient and Family Stated Goals To get my foot better  Date of Onset 04/21/13  Prior Treatments 05/02/2013 Dr. Otelia Sergeant I&D of abscess on Left Lateral foot  Evaluation and Treatment  Evaluation and Treatment Procedures Explained to Patient/Family Yes  Evaluation and Treatment Procedures agreed to  Wound 05/05/13 Diabetic ulcer Foot Left L lateral foot I & D   Date First Assessed/Time First Assessed: 05/05/13 1030   Wound Type: Diabetic ulcer  Location: Foot  Location Orientation: Left  Wound Description (Comments): L lateral foot I & D   Present on Admission: Yes  Site / Wound Assessment Brown;Granulation tissue;Painful;Yellow  % Wound base Red or Granulating 75%  % Wound base Yellow 15%  % Wound base Black 10%  Peri-wound Assessment Maceration;Edema (especially around inferiro aspect of wound)  Wound Length (cm) 8 cm  Wound Width (cm) 8.5 cm  Wound Depth (cm) 2.5 cm (right from middle aspect of wound)  Tunneling (cm) none   Undermining (cm) none  Margins Unattacted edges (unapproximated)  Closure None  Drainage Amount Moderate  Drainage Description Serosanguineous (no order detected after VAC removed)  Non-staged Wound Description Full thickness  Treatment Debridement (Selective);Hydrotherapy (Pulse lavage);Negative pressure wound therapy (VAC placed by [facility] nurse after pulsed Lavage)  Dressing Type (VAC )  Dressing Changed New  PTDressing Status Dry;Clean;Intact  Hydrotherapy  Pulsed Lavage with Suction (psi) 8 psi (8-12 varied)  Pulsed Lavage with Suction - Normal Saline Used 1000 mL  Pulsed Lavage Tip Tip with splash shield  Pulsed lavage therapy - wound location L lateral foot  Selective Debridement  Selective Debridement - Location L Lateral foot 9:00-12:00 location  Selective Debridement - Tools Used Forceps;Scissors   Selective Debridement - Tissue Removed brown and yellow necrotic tissue  Wound Therapy - Assess/Plan/Recommendations  Wound Therapy - Clinical Statement Pt presented with some necrotic tissue under VAC after VAC removed. in location of 9:00-12:00 (about 25% necrotic). Pt tolerated pulsed lavage and after I was able to selectively debride a fair amount of necrotic tissue . Other part of wound bed very red and good granulation tissue present. Pt to beenfit from PT pulsed lavage to get wound bed to <5% or less necrotic to continue to [facility] after that.   Wound Therapy - Functional Problem List Chronic diabetic wound  Factors Delaying/Impairing Wound Healing Immobility;Diabetes Mellitus  Hydrotherapy Plan Debridement;Patient/family education;Pulsatile lavage with suction  Wound Therapy - Frequency 6X / week (to soon transition to 3x /week or none. )  Wound Therapy - Current Recommendations Case manager/social work;Diabetic teaching;Nutritionist  Wound Therapy - Follow Up Recommendations Home health RN;Wound Care Center  Wound Plan to continue to provide pulsed lavage with debridement once less than 5% , may need to decrease frequency in order to not be disruptive to granualating tissue in VAC placment and decrease to 3x /week or none once wound bed imporves.   Wound Therapy Goals - Improve the function of patient's integumentary system by progressing the wound(s) through the phases of wound healing by:  Decrease Necrotic Tissue to less than 5%  Decrease Necrotic Tissue - Progress Goal set today  Increase Granulation Tissue to 95% or greater  Increase Granulation Tissue - Progress Goal set today  Patient/Family will be able to  tolerate hydrotherapy and cointue with dressing of VAC per WOC in next setting when able.  Patient/Family Instruction Goal - Progress Goal set today  Goals/treatment plan/discharge plan were made with and agreed upon by patient/family Yes  Time For Goal Achievement 2 weeks   Marella Bile, PT Pager: [phone] 05/05/2013

## 2013-05-06 DIAGNOSIS — Z113 Encounter for screening for infections with a predominantly sexual mode of transmission: Secondary | ICD-10-CM

## 2013-05-06 DIAGNOSIS — G546 Phantom limb syndrome with pain: Secondary | ICD-10-CM | POA: Diagnosis present

## 2013-05-06 LAB — COMPREHENSIVE METABOLIC PANEL
ALT: 13 U/L (ref 0–35)
AST: 14 U/L (ref 0–37)
Albumin: 2.4 g/dL — ABNORMAL LOW (ref 3.5–5.2)
CO2: 21 mEq/L (ref 19–32)
Chloride: 100 mEq/L (ref 96–112)
Creatinine, Ser: 1.52 mg/dL — ABNORMAL HIGH (ref 0.50–1.10)
GFR calc Af Amer: 50 mL/min — ABNORMAL LOW (ref >90)
GFR calc non Af Amer: 43 mL/min — ABNORMAL LOW (ref >90)
Sodium: 132 mEq/L — ABNORMAL LOW (ref 135–145)
Total Bilirubin: 0.4 mg/dL (ref 0.3–1.2)

## 2013-05-06 LAB — ANAEROBIC CULTURE: Gram Stain: NONE SEEN

## 2013-05-06 LAB — CBC WITH DIFFERENTIAL/PLATELET
Basophils Relative: 0 % (ref 0–1)
Eosinophils Absolute: 0.2 10*3/uL (ref 0.0–0.7)
Eosinophils Relative: 2 % (ref 0–5)
HCT: 29.4 % — ABNORMAL LOW (ref 36.0–46.0)
Hemoglobin: 9.9 g/dL — ABNORMAL LOW (ref 12.0–15.0)
Lymphs Abs: 2.3 10*3/uL (ref 0.7–4.0)
MCH: 29.4 pg (ref 26.0–34.0)
MCHC: 33.7 g/dL (ref 30.0–36.0)
MCV: 87.2 fL (ref 78.0–100.0)
Monocytes Relative: 8 % (ref 3–12)
Neutrophils Relative %: 60 % (ref 43–77)
Platelets: 314 10*3/uL (ref 150–400)
RBC: 3.37 MIL/uL — ABNORMAL LOW (ref 3.87–5.11)
WBC: 7.6 10*3/uL (ref 4.0–10.5)

## 2013-05-06 LAB — GLUCOSE, CAPILLARY
Glucose-Capillary: 184 mg/dL — ABNORMAL HIGH (ref 70–99)
Glucose-Capillary: 248 mg/dL — ABNORMAL HIGH (ref 70–99)
Glucose-Capillary: 253 mg/dL — ABNORMAL HIGH (ref 70–99)

## 2013-05-06 LAB — MAGNESIUM: Magnesium: 1.7 mg/dL (ref 1.5–2.5)

## 2013-05-06 MED ORDER — DEXTROSE 5 % IV SOLN
2.0000 g | INTRAVENOUS | Status: DC
  Administered 2013-05-06 – 2013-05-11 (×6): 2 g via INTRAVENOUS
  Filled 2013-05-06 (×7): qty 2

## 2013-05-06 MED ORDER — SODIUM CHLORIDE 0.9 % IJ SOLN
10.0000 mL | INTRAMUSCULAR | Status: DC | PRN
  Administered 2013-05-07 – 2013-05-12 (×7): 10 mL
  Administered 2013-05-12: 20 mL

## 2013-05-06 MED ORDER — METRONIDAZOLE 500 MG PO TABS
500.0000 mg | ORAL_TABLET | Freq: Three times a day (TID) | ORAL | Status: DC
  Administered 2013-05-06 – 2013-05-12 (×19): 500 mg via ORAL
  Filled 2013-05-06 (×22): qty 1

## 2013-05-06 MED ORDER — INSULIN GLARGINE 100 UNIT/ML ~~LOC~~ SOLN
22.0000 [IU] | Freq: Every day | SUBCUTANEOUS | Status: DC
  Filled 2013-05-06: qty 0.22

## 2013-05-06 NOTE — Progress Notes (Addendum)
Physical Therapy Treatment Patient Details Name: Karen Bennett MRN: [id number] DOB: 04-27-1976 Today's Date: 05/06/2013    Visit Information  Last PT Received On: 05/06/13 Assistance Needed: +2 Reason Eval/Treat Not Completed: note addendum/clarification .Marland Kitchen Per PTA Sallyanne Kuster. Pt declined due to not feeling well after lunch and just had completed PICC line placement and hydro therapy earlier in p.m. Pt was motivated, just not feeling well at this moment. Very open to participation in morning tomorrow.  History of Present Illness: s/p I and D left foot abcess with VAC placement     GP     Sallyanne Kuster 05/06/2013, 3:36 PM  Sallyanne Kuster, PTA Office- [phone] Addendum placed by Marella Bile, PT

## 2013-05-06 NOTE — Consult Note (Signed)
WOC wound consult note Reason for Consult:Application of NPWT to left lateral foot following PT treatment with hydrotherapy.  PA Kriste Basque in to view wound during session. 1 piece of black foam applied and secured without incident.  continuous pressure tolerated well. Trecia Rogers will assist with NPWT dressing application tomorrow.  I will assist on Thursday. Thanks, Ladona Mow, MSN, RN, GNP, [place], CWON-AP [phone])

## 2013-05-06 NOTE — Progress Notes (Signed)
PT HYDRO TREATMENT 05/06/13 1130  Subjective Assessment  Subjective I am not as scared today. how does it look.   Date of Onset 04/21/13  Evaluation and Treatment  Evaluation and Treatment Procedures Explained to Patient/Family Yes  Evaluation and Treatment Procedures agreed to  Wound 05/05/13 Diabetic ulcer Foot Left L lateral foot I & D   Date First Assessed/Time First Assessed: 05/05/13 1030   Wound Type: Diabetic ulcer  Location: Foot  Location Orientation: Left  Wound Description (Comments): L lateral foot I & D   Present on Admission: Yes  Site / Wound Assessment Brown;Granulation tissue;Painful;Yellow  % Wound base Red or Granulating 80%  % Wound base Yellow 10%  % Wound base Black 10%  Peri-wound Assessment Maceration;Edema  Wound Length (cm) 8 cm  Wound Width (cm) 8.5 cm  Wound Depth (cm) 2.5 cm  Undermining (cm) .5 (some noted a 9:00 at skin flap, and through to 11:00)  Margins Unattacted edges (unapproximated)  Closure None  Drainage Amount Minimal  Drainage Description Serosanguineous;No odor  Non-staged Wound Description Full thickness  Treatment Debridement (Selective);Hydrotherapy (Pulse lavage);Negative pressure wound therapy (VAC placed by [facility] nurse)  Dressing Status Dry;Intact;Clean (VAC)  Hydrotherapy  Pulsed Lavage with Suction (psi) 8 psi (8-12 varied)  Pulsed Lavage with Suction - Normal Saline Used 1000 mL  Pulsed Lavage Tip Tip with splash shield  PTPulsed lavage therapy - wound location L lateral foot  Selective Debridement  Selective Debridement - Location L Lateral foot 9:00-12:00 location  Selective Debridement - Tools Used Forceps;Scissors  Selective Debridement - Tissue Removed brown and yellow necrotic tissue  Wound Therapy - Assess/Plan/Recommendations  Wound Therapy - Clinical Statement Noted today more granulating tissue in bed with a skin flap/protrusion at 9:00 and necrotic yellow slough under this area and undermining. Debrided  with sharps  best I could today.   Wound Therapy - Functional Problem List Chronic diabetic wound  Factors Delaying/Impairing Wound Healing Immobility;Diabetes Mellitus  Hydrotherapy Plan Debridement;Patient/family education;Pulsatile lavage with suction  Wound Therapy - Frequency 6X / week  Wound Therapy - Current Recommendations Case manager/social work;Diabetic teaching;Nutritionist  Wound Therapy - Follow Up Recommendations Home health RN;Wound Care Center  Wound Plan to continue to provide pulsed lavage with debridement once less than 5% , may need to decrease frequency in order to not be disruptive to granualating tissue in VAC placment and decrease to 3x /week or none once wound bed imporves.   Wound Therapy Goals - Improve the function of patient's integumentary system by progressing the wound(s) through the phases of wound healing by:  Decrease Necrotic Tissue to less than 5%  Decrease Necrotic Tissue - Progress Progressing toward goal  Increase Granulation Tissue to 95% or greater  Increase Granulation Tissue - Progress Progressing toward goal  Marella Bile, PT Pager: [phone] 05/06/2013

## 2013-05-06 NOTE — Progress Notes (Signed)
Peripherally Inserted Central Catheter/Midline Placement  The IV Nurse has discussed with the patient and/or persons authorized to consent for the patient, the purpose of this procedure and the potential benefits and risks involved with this procedure.  The benefits include less needle sticks, lab draws from the catheter and patient may be discharged home with the catheter.  Risks include, but not limited to, infection, bleeding, blood clot (thrombus formation), and puncture of an artery; nerve damage and irregular heat beat.  Alternatives to this procedure were also discussed.  PICC/Midline Placement Documentation        Karen Bennett 05/06/2013, 1:47 PM

## 2013-05-06 NOTE — Progress Notes (Signed)
[facility] PROGRESS NOTE  Karen Bennett MVH:[id number] DOB: 02-18-1976 DOA: 04/30/2013 PCP: No primary provider on file.  Assessment/Plan: 1. Osteomyelitis fourth/fifth left metatarsal; wound care per orthopedic surgery -Continue antibiotics per surgery NOTE; Consulted ID for antibiotic recommendations -Requested PICC line placement for extended antibiotic treatment -Second round of bedside hydrotherapy to left foot scheduled for today;will receive multiple rounds prior to discharge  2. Diabetes type 2;, increased SSI to Resistant and increase  Lantus 22  unit QD, -Hemoglobin A1c 04/30/2013= 13 -Discussed with patient the need to obtain control of her diabetes if she wishes her wounds to heal, and does not want to lose the remainder of her foot. -Spoke with NCM Concerning obtaining a waiver for patient to be placed on Match program again in order to obtain her medication   3. Hyponatremia; continue to monitor still slightly low but stable.  -Normal saline has been stopped this point secondary to patient increasing consumption of fluid/food  4. Hypokalemia; within normal limits  5. Infection ; CBC within normal limits -Continue antibiotic per surgery; awaiting ID recommendations  6. HLD; -Lipid panel; not within ADA guidelines (HDL low, TG= 520), started on Lipitor 40 mg; will also  probably require niacin  7. Phantom Pain; continue Neurontin to BID  8. Orthopedic Pain; Continue current pain regimen. Per patient controlling the pain   9. Hypomagnesemia; within normal limits continue to monitor    Code Status:  Full Family Communication: Disposition Plan: Per surgery   Consultants:  Orthopedic surgery  Procedures: 05/01/2013  INCISION AND DRAINAGE LEFT FOREFOOT ABCESS (Left) by Dr Kerrin Champagne (orthopedics) . Gram stain shows few gram-positive cocci 05/03/2013 Wound cultures; RARE GROUP B STREP(S.AGALACTIAE)ISOLATED (final  05/03/2013)    Antibiotics:  Zosyn 10/9>>  Vancomycin 10/9>>    HPI/Subjective: Karen Bennett is a 37 y.o. 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 In 10/2011 Dr. Doneen Poisson performed a repeat I and D of the Left foot. She has been extremely noncompliant with any followup with orthopaedics and Dr. Annell Greening was on Call at the last ER presentation for her left foot in 08/2012 and preformed an I and D of a left foot lateral abscess. There are no office notes post op and it appears as though she did not return for follow up with Dr. Ophelia Charter after that  Procedure. There are no records of Dr. Ophelia Charter being contacted about increasing problems with the left foot this Week. Teaghan indicates that she called Dr. Ophelia Charter and a prescription for Keflex was called in for her. She presents With a two week history of increasing left foot swelling erythrema and pain. Had noticed intermittant chills, no fever. Seen in [facility] with normal WBC But definite left foot swelling erythrema and warmth consistent with a left Foot lateral abscess. Admitting MD describing as a grapefruit size swelling of the left lateral foot. xrays are concerning  For sclerotic changes of the distal resected ends of the 4th and 5th metatarsal amputation ends. 05/01/2013 just returned from surgery, positive pain, positive inability to void urine. 05/02/2013 states feeling better except for pain in foot which she describes as electric/scrapping. Would like Foley removed. 05/03/2013 reveals improved, decreased swelling, negative warmth to touch. Patient resting comfortably in bed for surgeons to come and change wound VAC 05/04/2013 states negative pain, and multiple questions on exactly what hydrotherapy entailed. No other complaints. 05/05/2013 states that initially anxious  about the hydrotherapy for her left foot but after completed felt  well. TODAY patient resting comfortably in bed, states the foot pain is approximately 8/10 and has not had her pain medication this a.m.     Objective: Filed Vitals:   05/05/13 0614 05/05/13 1659 05/05/13 2123 05/06/13 0453  BP: 107/57 143/64 143/80 123/78  Pulse: 71 72 70 79  Temp: 98 F (36.7 C) 98.1 F (36.7 C) 98.4 F (36.9 C)   TempSrc: Oral Oral Oral Oral  Resp: 18 18 20 20   Height:      Weight:      SpO2: 97% 98% 98% 93%    Intake/Output Summary (Last 24 hours) at 05/06/13 1135 Last data filed at 05/06/13 0815  Gross per 24 hour  Intake   1200 ml  Output      0 ml  Net   1200 ml   Filed Weights   04/30/13 1856 04/30/13 2100  Weight: 163.295 kg (360 lb) 170.7 kg (376 lb 5.2 oz)    Exam:   General:  A./O. X4, NAD  Cardiovascular: Regular rhythm and rate, negative murmurs rubs gallops   Respiratory: Good auscultation  Abdomen: Over the obese, plus bowel some  Musculoskeletal: Left foot wound VAC present over the remnants of fourth and fifth metatarsal. Negative warm to touch, positive swelling but decreased from yesterday .    Data Reviewed: Basic Metabolic Panel:  Recent Labs Lab 05/02/13 0520 05/03/13 0605 05/04/13 0530 05/05/13 0550 05/06/13 0535  NA 132* 130* 131* 132* 132*  K 3.4* 3.6 3.3* 3.4* 3.7  CL 97 96 98 102 100  CO2 25 24 22 22 21   GLUCOSE 306* 267* 262* 223* 203*  BUN 10 7 6 6 9   CREATININE 0.74 0.60 0.51 0.74 1.52*  CALCIUM 8.7 9.1 9.4 9.1 9.3  MG 1.6 1.5 1.4* 1.5 1.7   Liver Function Tests:  Recent Labs Lab 05/02/13 0520 05/03/13 0605 05/04/13 0530 05/05/13 0550 05/06/13 0535  AST 15 10 10 19 14   ALT 15 13 11 13 13   ALKPHOS 63 67 59 49 47  BILITOT 0.6 0.6 0.4 0.4 0.4  PROT 6.4 6.5 6.7 6.4 6.7  ALBUMIN 2.1* 2.2* 2.4* 2.3* 2.4*   No results found for this basename: LIPASE, AMYLASE,  in the last 168 hours No results found for this basename: AMMONIA,  in the last 168 hours CBC:  Recent Labs Lab 05/02/13 0520  05/03/13 0605 05/04/13 0530 05/05/13 0550 05/06/13 0535  WBC 6.3 6.4 7.0 5.8 7.6  NEUTROABS 3.4 3.0 3.1 2.7 4.6  HGB 10.6* 10.6* 10.2* 10.2* 9.9*  HCT 31.7* 31.0* 30.4* 30.1* 29.4*  MCV 86.1 85.9 85.6 86.5 87.2  PLT 182 232 274 297 314   Cardiac Enzymes: No results found for this basename: CKTOTAL, CKMB, CKMBINDEX, TROPONINI,  in the last 168 hours BNP (last 3 results) No results found for this basename: PROBNP,  in the last 8760 hours CBG:  Recent Labs Lab 05/05/13 1641 05/05/13 1957 05/05/13 2350 05/06/13 0349 05/06/13 0731  GLUCAP 208* 218* 192* 181* 184*    Recent Results (from the past 240 hour(s))  CULTURE, ROUTINE-ABSCESS     Status: None   Collection Time    04/30/13 11:00 PM      Result Value Range Status   Specimen Description ABSCESS LEFT FOOT   Final   Special Requests NONE   Final   Gram Stain     Final   Value: RARE WBC PRESENT, PREDOMINANTLY PMN  NO SQUAMOUS EPITHELIAL CELLS SEEN     NO ORGANISMS SEEN     Performed at [facility]   Culture     Final   Value: RARE GROUP B STREP(S.AGALACTIAE)ISOLATED     Note: TESTING AGAINST S. AGALACTIAE NOT ROUTINELY PERFORMED DUE TO PREDICTABILITY OF AMP/PEN/VAN SUSCEPTIBILITY.     Performed at [facility]   Report Status 05/03/2013 FINAL   Final  ANAEROBIC CULTURE     Status: None   Collection Time    05/01/13  2:08 PM      Result Value Range Status   Specimen Description WOUND ABSCESS LEFT FOOT   Final   Special Requests PATIENT ON FOLLOWING VANCOMYCIN 1.5G ZOSYN 3.375G   Final   Gram Stain     Final   Value: RARE WBC PRESENT,BOTH PMN AND MONONUCLEAR     NO SQUAMOUS EPITHELIAL CELLS SEEN     RARE GRAM POSITIVE COCCI     IN PAIRS     Performed at [facility]   Culture     Final   Value: NO ANAEROBES ISOLATED     Performed at [facility]   Report Status 05/06/2013 FINAL   Final  WOUND CULTURE     Status: None   Collection Time    05/01/13  2:08 PM      Result Value  Range Status   Specimen Description WOUND ABSCESS LEFT FOOT   Final   Special Requests PATIENT ON FOLLOWING VANCOMYCIN 1.5G ZOSYN 3.375G   Final   Gram Stain     Final   Value: RARE WBC PRESENT,BOTH PMN AND MONONUCLEAR     NO SQUAMOUS EPITHELIAL CELLS SEEN     RARE GRAM POSITIVE COCCI     IN PAIRS     Performed at [facility]   Culture     Final   Value: FEW GROUP B STREP(S.AGALACTIAE)ISOLATED     Note: TESTING AGAINST S. AGALACTIAE NOT ROUTINELY PERFORMED DUE TO PREDICTABILITY OF AMP/PEN/VAN SUSCEPTIBILITY.     Performed at [facility]   Report Status 05/03/2013 FINAL   Final  GRAM STAIN     Status: None   Collection Time    05/01/13  2:08 PM      Result Value Range Status   Specimen Description WOUND ABSCESS LEFT FOOT   Final   Special Requests PATIENT ON FOLLOWING VANCOMYCIN 1.5G ZOSYN 3.375G   Final   Gram Stain     Final   Value: NO ORGANISMS SEEN     NO WBC SEEN     Gram Stain Report Called to,Read Back By and Verified With: EDGALL,E. AT 1618 ON 10.09.14 BY LOVE,T.   Report Status 05/01/2013 FINAL   Final  TISSUE CULTURE     Status: None   Collection Time    05/01/13  2:14 PM      Result Value Range Status   Specimen Description FOOT LEFT METATARSAL   Final   Special Requests PATIENT ON FOLLOWING VANCOMYCIN 1.5G ZOSYN 3.375G   Final   Gram Stain     Final   Value: NO WBC SEEN     NO ORGANISMS SEEN     Performed at [facility]   Culture     Final   Value: FEW GROUP B STREP(S.AGALACTIAE)ISOLATED     Note: TESTING AGAINST S. AGALACTIAE NOT ROUTINELY PERFORMED DUE TO PREDICTABILITY OF AMP/PEN/VAN SUSCEPTIBILITY.     Performed at [facility]   Report Status  05/04/2013 FINAL   Final  ANAEROBIC CULTURE     Status: None   Collection Time    05/01/13  2:14 PM      Result Value Range Status   Specimen Description FOOT LEFT METATARSAL   Final   Special Requests PATIENT ON FOLLOWING VANCOMYCIN 1.5G ZOSYN 3.375G   Final   Gram Stain     Final    Value: NO WBC SEEN     NO ORGANISMS SEEN     Performed at [facility]   Culture     Final   Value: NO ANAEROBES ISOLATED     Performed at [facility]   Report Status 05/06/2013 FINAL   Final  GRAM STAIN     Status: None   Collection Time    05/01/13  2:14 PM      Result Value Range Status   Specimen Description FOOT LEFT METATARSAL   Final   Special Requests PATIENT ON FOLLOWING VANCOMYCIN 1.5G ZOSYN 3.375G   Final   Gram Stain     Final   Value: NO WBC SEEN     FEW GRAM POSITIVE COCCI     Gram Stain Report Called to,Read Back By and Verified With: EDGAL,E. AT 1617 ON 10.09.14 BY LOVE,T.   Report Status 05/01/2013 FINAL   Final  MRSA PCR SCREENING     Status: None   Collection Time    05/04/13  8:18 AM      Result Value Range Status   MRSA by PCR NEGATIVE  NEGATIVE Final   Comment:            The GeneXpert MRSA Assay (FDA     approved for NASAL specimens     only), is one component of a     comprehensive MRSA colonization     surveillance program. It is not     intended to diagnose MRSA     infection nor to guide or     monitor treatment for     MRSA infections.     Studies: No results found.  Scheduled Meds: . atorvastatin  40 mg Oral q1800  . enoxaparin (LOVENOX) injection  30 mg Subcutaneous Q24H  . ferrous sulfate  325 mg Oral TID PC  . gabapentin  300 mg Oral BID  . insulin aspart  0-20 Units Subcutaneous Q4H  . insulin glargine  15 Units Subcutaneous Daily  . magnesium sulfate 1 - 4 g bolus IVPB  2 g Intravenous Once  . piperacillin-tazobactam (ZOSYN)  IV  3.375 g Intravenous Q8H  . vancomycin  1,250 mg Intravenous Q8H   Continuous Infusions:   Principal Problem:   Diabetic foot ulcer Active Problems:   DM (diabetes mellitus), type 2, uncontrolled with complications   Obesity, Class III, BMI 40-49.9 (morbid obesity)   Hyponatremia   Osteomyelitis   Cellulitis and abscess of leg, except foot   Diabetic peripheral neuropathy  associated with type 2 diabetes mellitus   Hypokalemia   HLD (hyperlipidemia)   Hypomagnesemia   Phantom limb pain    Time spent: 35 minutes    WOODS, CURTIS, J  [facility] Pager [phone]. If 7PM-7AM, please contact night-coverage at [email or web address], password [facility] 05/06/2013, 11:35 AM  LOS: 6 days

## 2013-05-06 NOTE — Progress Notes (Signed)
Subjective: 5 Days Post-Op Procedure(s) (LRB): INCISION AND DRAINAGE LEFT FOREFOOT ABCESS  (Left) APPLICATION OF WOUND VAC (Left) Patient reports pain as moderate.   Pian with dressing changes diminishing.  Objective: Vital signs in last 24 hours: Temp:  [98.1 F (36.7 C)-98.4 F (36.9 C)] 98.4 F (36.9 C) (10/13 2123) Pulse Rate:  [70-79] 79 (10/14 0453) Resp:  [18-20] 20 (10/14 0453) BP: (123-143)/(64-80) 123/78 mmHg (10/14 0453) SpO2:  [93 %-98 %] 93 % (10/14 0453)  Intake/Output from previous day: 10/13 0701 - 10/14 0700 In: 1200 [P.O.:1200] Out: -  Intake/Output this shift: Total I/O In: 360 [P.O.:360] Out: -    Recent Labs  05/04/13 0530 05/05/13 0550 05/06/13 0535  HGB 10.2* 10.2* 9.9*    Recent Labs  05/05/13 0550 05/06/13 0535  WBC 5.8 7.6  RBC 3.48* 3.37*  HCT 30.1* 29.4*  PLT 297 314    Recent Labs  05/05/13 0550 05/06/13 0535  NA 132* 132*  K 3.4* 3.7  CL 102 100  CO2 22 21  BUN 6 9  CREATININE 0.74 1.52*  GLUCOSE 223* 203*  CALCIUM 9.1 9.3   No results found for this basename: LABPT, INR,  in the last 72 hours  Left foot large lateral wound with beefy red granular tissue, most distal edge of wound with undermining and necrotic tissue. Most posterior aspect of wound there is an area of possible wound extension with possible underlying necrotic tissue.   Assessment/Plan: 5 Days Post-Op Procedure(s) (LRB): INCISION AND DRAINAGE LEFT FOREFOOT ABCESS  (Left) APPLICATION OF WOUND VAC (Left) Continue hydrotherapy and wound vac. (Thanks to PT and Wound care). Dr. Otelia Sergeant  To resume care tomorrow. PICC line to be placed today. Continue Vancomycin and zosyn.   Karen Bennett 05/06/2013, 11:58 AM

## 2013-05-06 NOTE — Consult Note (Signed)
[facility]    Date of Admission:  04/30/2013  Date of Consult:  05/06/2013  Reason for Consult: foot abscess, osteomyelitis in diabetic Referring Physician: Dr. Joseph Art   HPI: Karen Bennett is an 37 y.o. 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 X 1.4 cm adjacent to remnant of 5th MT. As well as phlegmon.   Her blood cultures grew  1/2 MSSA. She underwent I and D of abscess by Dr. Ophelia Charter on 09/06/12. Cultures from OR at that time yielded MSSA  She had TTE that did not show endocarditis thought TEE was not pursueed. She was set up with IV ancef 2 g IV q 8 hours and HSFU with [place] but never made her HSFU. She claims to have finished her IV abx and that transportation was issue in getting to our clinic.  She then developed blistering and erythema at her amputation site and over 24 hours prior to admission she developed large swelling with protrusion of some tissue thru wound with foul smelling drainage, and surrounding redness now extending up to ankle area. She was placed on vancomycin and zosyn.   CT scan showed:   1. Multiple soft tissue abscesses in around the tarsometatarsal  joints. Findings consistent with septic joints between the proximal  metatarsals.  2. New degenerative and erosive changes in several bones which could  be arthritic but I suspect are more likely related to infection.    She was taken to the OR and underwent I and D of large forefoot abscess on 05/02/13. Intraoperative and wound cultures have all grown Group B streptococcus.         Past Medical History  Diagnosis Date  . Cellulitis   . Diabetes mellitus   . PE (pulmonary embolism) ~2007-2008    Not on anticoagulation    Past  Surgical History  Procedure Laterality Date  . Toe amputation      last 2 on L foot  . Surgery to remove hematoma in l leg    . Abscess removal from l groin    . I&d extremity  11/12/2011    Procedure: IRRIGATION AND DEBRIDEMENT EXTREMITY;  Surgeon: Kathryne Hitch, MD;  Location: [facility];  Service: Orthopedics;  Laterality: Left;  foot left  . I&d extremity Left 09/06/2012    Procedure: IRRIGATION AND DEBRIDEMENT EXTREMITY;  Surgeon: Eldred Manges, MD;  Location: [facility];  Service: Orthopedics;  Laterality: Left;  . I&d extremity Left 05/01/2013    Procedure: INCISION AND DRAINAGE LEFT FOREFOOT ABCESS ;  Surgeon: Kerrin Champagne, MD;  Location: [facility];  Service: Orthopedics;  Laterality: Left;  . Application of wound vac Left 05/01/2013    Procedure: APPLICATION OF WOUND VAC;  Surgeon: Kerrin Champagne, MD;  Location: [facility];  Service: Orthopedics;  Laterality: Left;  ergies:   Allergies  Allergen Reactions  . Zofran Itching and Nausea And Vomiting  . Doxycycline Nausea And Vomiting  . Morphine And Related Itching     Medications: I have reviewed patients current medications as documented in Epic Anti-infectives   Start     Dose/Rate Route Frequency Ordered Stop   05/06/13 1500  metroNIDAZOLE (FLAGYL) tablet 500  mg     500 mg Oral 3 times per day 05/06/13 1417     05/06/13 1430  cefTRIAXone (ROCEPHIN) 2 g in dextrose 5 % 50 mL IVPB     2 g 2 mL/hr over 1,500 Minutes Intravenous Every 24 hours 05/06/13 1417     05/03/13 0400  vancomycin (VANCOCIN) 1,250 mg in sodium chloride 0.9 % 250 mL IVPB  Status:  Discontinued     1,250 mg 166.7 mL/hr over 90 Minutes Intravenous Every 8 hours 05/02/13 2052 05/06/13 1417   05/01/13 1434  polymyxin B 500,000 Units, bacitracin 50,000 Units in sodium chloride irrigation 0.9 % 500 mL irrigation  Status:  Discontinued       As needed 05/01/13 1434 05/01/13 1501   05/01/13 0800  vancomycin (VANCOCIN) 1,500 mg in sodium chloride 0.9 % 500 mL IVPB   Status:  Discontinued     1,500 mg 250 mL/hr over 120 Minutes Intravenous Every 12 hours 04/30/13 1956 05/02/13 2052   05/01/13 0200  piperacillin-tazobactam (ZOSYN) IVPB 3.375 g  Status:  Discontinued     3.375 g 12.5 mL/hr over 240 Minutes Intravenous Every 8 hours 04/30/13 2215 05/06/13 1417   04/30/13 2000  vancomycin (VANCOCIN) 2,500 mg in sodium chloride 0.9 % 500 mL IVPB     2,500 mg 250 mL/hr over 120 Minutes Intravenous  Once 04/30/13 1909 04/30/13 2209   04/30/13 1915  piperacillin-tazobactam (ZOSYN) IVPB 3.375 g     3.375 g 100 mL/hr over 30 Minutes Intravenous  Once 04/30/13 1915 04/30/13 2007   04/30/13 1845  piperacillin-tazobactam (ZOSYN) IVPB 4.5 g  Status:  Discontinued     4.5 g 200 mL/hr over 30 Minutes Intravenous  Once 04/30/13 1842 04/30/13 1914      Social History:  reports that she has never smoked. She has never used smokeless tobacco. She reports that she does not drink alcohol or use illicit drugs.  Family History  Problem Relation Age of Onset  . Diabetes Mother   . Coronary artery disease Mother     As in HPI and primary teams notes otherwise 12 point review of systems is negative  Blood pressure 148/74, pulse 72, temperature 98.5 F (36.9 C), temperature source Oral, resp. rate 20, height 5' 8.11" (1.73 m), weight 376 lb 5.2 oz (170.7 kg), last menstrual period 04/13/2013, SpO2 94.00%. General: Alert and awake, oriented x3, not in any acute distress, obese HEENT: anicteric sclera, pupils reactive to light and accommodation, EOMI, oropharynx clear and without exudate CVS regular rate, normal r,  no murmur rubs or gallops Chest: clear to auscultation bilaterally, no wheezing, rales or rhonchi Abdomen: soft nontender, nondistended, normal bowel sounds, Extremities: left foot with large vacuum dressing in place Neuro: nonfocal, strength and sensation intact   Results for orders placed during the hospital encounter of 04/30/13 (from the past 48 hour(s))   GLUCOSE, CAPILLARY     Status: Abnormal   Collection Time    05/04/13  4:40 PM      Result Value Range   Glucose-Capillary 272 (*) 70 - 99 mg/dL   Comment 1 Notify RN    VANCOMYCIN, TROUGH     Status: None   Collection Time    05/04/13  7:28 PM      Result Value Range   Vancomycin Tr 15.6  10.0 - 20.0 ug/mL  GLUCOSE, CAPILLARY     Status: Abnormal   Collection Time    05/04/13  8:26 PM      Result  Value Range   Glucose-Capillary 261 (*) 70 - 99 mg/dL   Comment 1 Notify RN    GLUCOSE, CAPILLARY     Status: Abnormal   Collection Time    05/04/13 11:54 PM      Result Value Range   Glucose-Capillary 242 (*) 70 - 99 mg/dL   Comment 1 Notify RN    GLUCOSE, CAPILLARY     Status: Abnormal   Collection Time    05/05/13  4:10 AM      Result Value Range   Glucose-Capillary 203 (*) 70 - 99 mg/dL   Comment 1 Notify RN    COMPREHENSIVE METABOLIC PANEL     Status: Abnormal   Collection Time    05/05/13  5:50 AM      Result Value Range   Sodium 132 (*) 135 - 145 mEq/L   Potassium 3.4 (*) 3.5 - 5.1 mEq/L   Chloride 102  96 - 112 mEq/L   CO2 22  19 - 32 mEq/L   Glucose, Bld 223 (*) 70 - 99 mg/dL   BUN 6  6 - 23 mg/dL   Creatinine, Ser 8.29  0.50 - 1.10 mg/dL   Calcium 9.1  8.4 - 56.2 mg/dL   Total Protein 6.4  6.0 - 8.3 g/dL   Albumin 2.3 (*) 3.5 - 5.2 g/dL   AST 19  0 - 37 U/L   ALT 13  0 - 35 U/L   Alkaline Phosphatase 49  39 - 117 U/L   Total Bilirubin 0.4  0.3 - 1.2 mg/dL   GFR calc non Af Amer >90  >90 mL/min   GFR calc Af Amer >90  >90 mL/min   Comment: (NOTE)     The eGFR has been calculated using the CKD EPI equation.     This calculation has not been validated in all clinical situations.     eGFR's persistently <90 mL/min signify possible Chronic Kidney     Disease.  CBC WITH DIFFERENTIAL     Status: Abnormal   Collection Time    05/05/13  5:50 AM      Result Value Range   WBC 5.8  4.0 - 10.5 K/uL   RBC 3.48 (*) 3.87 - 5.11 MIL/uL   Hemoglobin 10.2 (*) 12.0 - 15.0  g/dL   HCT 13.0 (*) 86.5 - 78.4 %   MCV 86.5  78.0 - 100.0 fL   MCH 29.3  26.0 - 34.0 pg   MCHC 33.9  30.0 - 36.0 g/dL   RDW 69.6  29.5 - 28.4 %   Platelets 297  150 - 400 K/uL   Neutrophils Relative % 45  43 - 77 %   Neutro Abs 2.7  1.7 - 7.7 K/uL   Lymphocytes Relative 41  12 - 46 %   Lymphs Abs 2.4  0.7 - 4.0 K/uL   Monocytes Relative 10  3 - 12 %   Monocytes Absolute 0.6  0.1 - 1.0 K/uL   Eosinophils Relative 3  0 - 5 %   Eosinophils Absolute 0.2  0.0 - 0.7 K/uL   Basophils Relative 0  0 - 1 %   Basophils Absolute 0.0  0.0 - 0.1 K/uL  MAGNESIUM     Status: None   Collection Time    05/05/13  5:50 AM      Result Value Range   Magnesium 1.5  1.5 - 2.5 mg/dL  GLUCOSE, CAPILLARY     Status: Abnormal   Collection Time  05/05/13  7:11 AM      Result Value Range   Glucose-Capillary 182 (*) 70 - 99 mg/dL   Comment 1 Notify RN     Comment 2 Documented in Chart    GLUCOSE, CAPILLARY     Status: Abnormal   Collection Time    05/05/13 11:54 AM      Result Value Range   Glucose-Capillary 185 (*) 70 - 99 mg/dL   Comment 1 Notify RN     Comment 2 Documented in Chart    GLUCOSE, CAPILLARY     Status: Abnormal   Collection Time    05/05/13  4:41 PM      Result Value Range   Glucose-Capillary 208 (*) 70 - 99 mg/dL   Comment 1 Notify RN    GLUCOSE, CAPILLARY     Status: Abnormal   Collection Time    05/05/13  7:57 PM      Result Value Range   Glucose-Capillary 218 (*) 70 - 99 mg/dL  GLUCOSE, CAPILLARY     Status: Abnormal   Collection Time    05/05/13 11:50 PM      Result Value Range   Glucose-Capillary 192 (*) 70 - 99 mg/dL  GLUCOSE, CAPILLARY     Status: Abnormal   Collection Time    05/06/13  3:49 AM      Result Value Range   Glucose-Capillary 181 (*) 70 - 99 mg/dL  COMPREHENSIVE METABOLIC PANEL     Status: Abnormal   Collection Time    05/06/13  5:35 AM      Result Value Range   Sodium 132 (*) 135 - 145 mEq/L   Potassium 3.7  3.5 - 5.1 mEq/L   Chloride 100  96 -  112 mEq/L   CO2 21  19 - 32 mEq/L   Glucose, Bld 203 (*) 70 - 99 mg/dL   BUN 9  6 - 23 mg/dL   Comment: REPEATED TO VERIFY   Creatinine, Ser 1.52 (*) 0.50 - 1.10 mg/dL   Comment: DELTA CHECK NOTED     REPEATED TO VERIFY   Calcium 9.3  8.4 - 10.5 mg/dL   Total Protein 6.7  6.0 - 8.3 g/dL   Albumin 2.4 (*) 3.5 - 5.2 g/dL   AST 14  0 - 37 U/L   ALT 13  0 - 35 U/L   Alkaline Phosphatase 47  39 - 117 U/L   Total Bilirubin 0.4  0.3 - 1.2 mg/dL   GFR calc non Af Amer 43 (*) >90 mL/min   GFR calc Af Amer 50 (*) >90 mL/min   Comment: (NOTE)     The eGFR has been calculated using the CKD EPI equation.     This calculation has not been validated in all clinical situations.     eGFR's persistently <90 mL/min signify possible Chronic Kidney     Disease.  CBC WITH DIFFERENTIAL     Status: Abnormal   Collection Time    05/06/13  5:35 AM      Result Value Range   WBC 7.6  4.0 - 10.5 K/uL   RBC 3.37 (*) 3.87 - 5.11 MIL/uL   Hemoglobin 9.9 (*) 12.0 - 15.0 g/dL   HCT 16.1 (*) 09.6 - 04.5 %   MCV 87.2  78.0 - 100.0 fL   MCH 29.4  26.0 - 34.0 pg   MCHC 33.7  30.0 - 36.0 g/dL   RDW 40.9  81.1 - 91.4 %   Platelets  314  150 - 400 K/uL   Neutrophils Relative % 60  43 - 77 %   Neutro Abs 4.6  1.7 - 7.7 K/uL   Lymphocytes Relative 30  12 - 46 %   Lymphs Abs 2.3  0.7 - 4.0 K/uL   Monocytes Relative 8  3 - 12 %   Monocytes Absolute 0.6  0.1 - 1.0 K/uL   Eosinophils Relative 2  0 - 5 %   Eosinophils Absolute 0.2  0.0 - 0.7 K/uL   Basophils Relative 0  0 - 1 %   Basophils Absolute 0.0  0.0 - 0.1 K/uL  MAGNESIUM     Status: None   Collection Time    05/06/13  5:35 AM      Result Value Range   Magnesium 1.7  1.5 - 2.5 mg/dL  GLUCOSE, CAPILLARY     Status: Abnormal   Collection Time    05/06/13  7:31 AM      Result Value Range   Glucose-Capillary 184 (*) 70 - 99 mg/dL   Comment 1 Notify RN    GLUCOSE, CAPILLARY     Status: Abnormal   Collection Time    05/06/13 12:24 PM      Result Value  Range   Glucose-Capillary 205 (*) 70 - 99 mg/dL      Component Value Date/Time   SDES FOOT LEFT METATARSAL 05/01/2013 1414   SDES FOOT LEFT METATARSAL 05/01/2013 1414   SDES FOOT LEFT METATARSAL 05/01/2013 1414   SPECREQUEST PATIENT ON FOLLOWING VANCOMYCIN 1.5G ZOSYN 3.375G 05/01/2013 1414   SPECREQUEST PATIENT ON FOLLOWING VANCOMYCIN 1.5G ZOSYN 3.375G 05/01/2013 1414   SPECREQUEST PATIENT ON FOLLOWING VANCOMYCIN 1.5G ZOSYN 3.375G 05/01/2013 1414   CULT  Value: FEW GROUP B STREP(S.AGALACTIAE)ISOLATED Note: TESTING AGAINST S. AGALACTIAE NOT ROUTINELY PERFORMED DUE TO PREDICTABILITY OF AMP/PEN/VAN SUSCEPTIBILITY. Performed at [facility] 05/01/2013 1414   CULT  Value: NO ANAEROBES ISOLATED Performed at [facility] 05/01/2013 1414   REPTSTATUS 05/04/2013 FINAL 05/01/2013 1414   REPTSTATUS 05/06/2013 FINAL 05/01/2013 1414   REPTSTATUS 05/01/2013 FINAL 05/01/2013 1414   No results found.   Recent Results (from the past 720 hour(s))  CULTURE, ROUTINE-ABSCESS     Status: None   Collection Time    04/30/13 11:00 PM      Result Value Range Status   Specimen Description ABSCESS LEFT FOOT   Final   Special Requests NONE   Final   Gram Stain     Final   Value: RARE WBC PRESENT, PREDOMINANTLY PMN     NO SQUAMOUS EPITHELIAL CELLS SEEN     NO ORGANISMS SEEN     Performed at [facility]   Culture     Final   Value: RARE GROUP B STREP(S.AGALACTIAE)ISOLATED     Note: TESTING AGAINST S. AGALACTIAE NOT ROUTINELY PERFORMED DUE TO PREDICTABILITY OF AMP/PEN/VAN SUSCEPTIBILITY.     Performed at [facility]   Report Status 05/03/2013 FINAL   Final  ANAEROBIC CULTURE     Status: None   Collection Time    05/01/13  2:08 PM      Result Value Range Status   Specimen Description WOUND ABSCESS LEFT FOOT   Final   Special Requests PATIENT ON FOLLOWING VANCOMYCIN 1.5G ZOSYN 3.375G   Final   Gram Stain     Final   Value: RARE WBC PRESENT,BOTH PMN AND MONONUCLEAR     NO SQUAMOUS  EPITHELIAL CELLS SEEN     RARE Romie Minus  POSITIVE COCCI     IN PAIRS     Performed at [facility]   Culture     Final   Value: NO ANAEROBES ISOLATED     Performed at [facility]   Report Status 05/06/2013 FINAL   Final  WOUND CULTURE     Status: None   Collection Time    05/01/13  2:08 PM      Result Value Range Status   Specimen Description WOUND ABSCESS LEFT FOOT   Final   Special Requests PATIENT ON FOLLOWING VANCOMYCIN 1.5G ZOSYN 3.375G   Final   Gram Stain     Final   Value: RARE WBC PRESENT,BOTH PMN AND MONONUCLEAR     NO SQUAMOUS EPITHELIAL CELLS SEEN     RARE GRAM POSITIVE COCCI     IN PAIRS     Performed at [facility]   Culture     Final   Value: FEW GROUP B STREP(S.AGALACTIAE)ISOLATED     Note: TESTING AGAINST S. AGALACTIAE NOT ROUTINELY PERFORMED DUE TO PREDICTABILITY OF AMP/PEN/VAN SUSCEPTIBILITY.     Performed at [facility]   Report Status 05/03/2013 FINAL   Final  GRAM STAIN     Status: None   Collection Time    05/01/13  2:08 PM      Result Value Range Status   Specimen Description WOUND ABSCESS LEFT FOOT   Final   Special Requests PATIENT ON FOLLOWING VANCOMYCIN 1.5G ZOSYN 3.375G   Final   Gram Stain     Final   Value: NO ORGANISMS SEEN     NO WBC SEEN     Gram Stain Report Called to,Read Back By and Verified With: EDGALL,E. AT 1618 ON 10.09.14 BY LOVE,T.   Report Status 05/01/2013 FINAL   Final  TISSUE CULTURE     Status: None   Collection Time    05/01/13  2:14 PM      Result Value Range Status   Specimen Description FOOT LEFT METATARSAL   Final   Special Requests PATIENT ON FOLLOWING VANCOMYCIN 1.5G ZOSYN 3.375G   Final   Gram Stain     Final   Value: NO WBC SEEN     NO ORGANISMS SEEN     Performed at [facility]   Culture     Final   Value: FEW GROUP B STREP(S.AGALACTIAE)ISOLATED     Note: TESTING AGAINST S. AGALACTIAE NOT ROUTINELY PERFORMED DUE TO PREDICTABILITY OF AMP/PEN/VAN SUSCEPTIBILITY.      Performed at [facility]   Report Status 05/04/2013 FINAL   Final  ANAEROBIC CULTURE     Status: None   Collection Time    05/01/13  2:14 PM      Result Value Range Status   Specimen Description FOOT LEFT METATARSAL   Final   Special Requests PATIENT ON FOLLOWING VANCOMYCIN 1.5G ZOSYN 3.375G   Final   Gram Stain     Final   Value: NO WBC SEEN     NO ORGANISMS SEEN     Performed at [facility]   Culture     Final   Value: NO ANAEROBES ISOLATED     Performed at [facility]   Report Status 05/06/2013 FINAL   Final  GRAM STAIN     Status: None   Collection Time    05/01/13  2:14 PM      Result Value Range Status   Specimen Description FOOT LEFT METATARSAL  Final   Special Requests PATIENT ON FOLLOWING VANCOMYCIN 1.5G ZOSYN 3.375G   Final   Gram Stain     Final   Value: NO WBC SEEN     FEW GRAM POSITIVE COCCI     Gram Stain Report Called to,Read Back By and Verified With: EDGAL,E. AT 1617 ON 10.09.14 BY LOVE,T.   Report Status 05/01/2013 FINAL   Final  MRSA PCR SCREENING     Status: None   Collection Time    05/04/13  8:18 AM      Result Value Range Status   MRSA by PCR NEGATIVE  NEGATIVE Final   Comment:            The GeneXpert MRSA Assay (FDA     approved for NASAL specimens     only), is one component of a     comprehensive MRSA colonization     surveillance program. It is not     intended to diagnose MRSA     infection nor to guide or     monitor treatment for     MRSA infections.     Impression/Recommendation  37 year old with recent amputation site abscess with MSSA and MSSA bacteremia in February, now admitted with abscess at the same site sp I andD with GBS from cultures   #1 Foot abscess Osteomyelitis with GBS in Diabetic:  I am narrowing her to Rocephin daily and will also have her take flagyl in case she had concommittant anerobes playing a role.  I am very concerned about her ability to salvage her amputation site. This is the  second hospitalization in past 8 months and the last time she actually became bacteremic. EVen if  We do cure her current infection I am worried she will yet again succumt to infection at this site  For now would plan on at least 6 weeks of postop rocephin +/- oral flagyl (if she can tolerate this oral antibiotic  #2 Screening: check HIV, hep panel  #3 DM: control of her DM is critical for her wound healing etc.   Thank you so much for this interesting consult  [facility] [phone] (pager) [phone] (office) 05/06/2013, 3:55 PM  Paulette Blanch Dam 05/06/2013, 3:55 PM

## 2013-05-06 NOTE — Progress Notes (Signed)
Clinical Social Work  Patient was discussed during [employer]. CSW received referral to assist with possible SNF placement. Per chart review, it is undetermined how long patient will need hydrotherapy. Patient reports that she does not want to go to SNF placement because she has animals at home. Patient reports she has transportation to go to wound clinic or is interested in [facility]. CM reports depending on needs then it will need to researched to determine if these options are available for patient.  Patient reports she has a Careers information officer named Corrie Dandy [phone]) who is assisting with application.   CSW will continue to follow.  [place], [place] [phone]

## 2013-05-07 DIAGNOSIS — N1831 Chronic kidney disease, stage 3a: Secondary | ICD-10-CM | POA: Diagnosis present

## 2013-05-07 DIAGNOSIS — N179 Acute kidney failure, unspecified: Secondary | ICD-10-CM | POA: Diagnosis present

## 2013-05-07 DIAGNOSIS — N289 Disorder of kidney and ureter, unspecified: Secondary | ICD-10-CM | POA: Diagnosis present

## 2013-05-07 DIAGNOSIS — B951 Streptococcus, group B, as the cause of diseases classified elsewhere: Secondary | ICD-10-CM

## 2013-05-07 DIAGNOSIS — N184 Chronic kidney disease, stage 4 (severe): Secondary | ICD-10-CM | POA: Diagnosis present

## 2013-05-07 LAB — CBC WITH DIFFERENTIAL/PLATELET
Basophils Relative: 0 % (ref 0–1)
Eosinophils Absolute: 0.2 10*3/uL (ref 0.0–0.7)
Eosinophils Relative: 3 % (ref 0–5)
HCT: 28.7 % — ABNORMAL LOW (ref 36.0–46.0)
Hemoglobin: 9.7 g/dL — ABNORMAL LOW (ref 12.0–15.0)
Lymphocytes Relative: 30 % (ref 12–46)
Lymphs Abs: 1.9 10*3/uL (ref 0.7–4.0)
MCH: 29.5 pg (ref 26.0–34.0)
MCHC: 33.8 g/dL (ref 30.0–36.0)
MCV: 87.2 fL (ref 78.0–100.0)
Monocytes Absolute: 0.6 10*3/uL (ref 0.1–1.0)
Monocytes Relative: 9 % (ref 3–12)
Neutrophils Relative %: 58 % (ref 43–77)
RBC: 3.29 MIL/uL — ABNORMAL LOW (ref 3.87–5.11)
WBC: 6.4 10*3/uL (ref 4.0–10.5)

## 2013-05-07 LAB — COMPREHENSIVE METABOLIC PANEL
AST: 12 U/L (ref 0–37)
Albumin: 2.5 g/dL — ABNORMAL LOW (ref 3.5–5.2)
BUN: 12 mg/dL (ref 6–23)
Calcium: 9.5 mg/dL (ref 8.4–10.5)
Chloride: 99 mEq/L (ref 96–112)
Creatinine, Ser: 1.66 mg/dL — ABNORMAL HIGH (ref 0.50–1.10)
GFR calc Af Amer: 45 mL/min — ABNORMAL LOW (ref >90)
GFR calc non Af Amer: 39 mL/min — ABNORMAL LOW (ref >90)
Glucose, Bld: 178 mg/dL — ABNORMAL HIGH (ref 70–99)
Potassium: 3.6 mEq/L (ref 3.5–5.1)
Total Bilirubin: 0.3 mg/dL (ref 0.3–1.2)
Total Protein: 6.6 g/dL (ref 6.0–8.3)

## 2013-05-07 LAB — GLUCOSE, CAPILLARY
Glucose-Capillary: 175 mg/dL — ABNORMAL HIGH (ref 70–99)
Glucose-Capillary: 179 mg/dL — ABNORMAL HIGH (ref 70–99)
Glucose-Capillary: 200 mg/dL — ABNORMAL HIGH (ref 70–99)
Glucose-Capillary: 238 mg/dL — ABNORMAL HIGH (ref 70–99)

## 2013-05-07 LAB — HEPATITIS PANEL, ACUTE
HCV Ab: NEGATIVE
Hep A IgM: NONREACTIVE
Hep B C IgM: NONREACTIVE

## 2013-05-07 LAB — HIV ANTIBODY (ROUTINE TESTING W REFLEX): HIV: NONREACTIVE

## 2013-05-07 LAB — MAGNESIUM: Magnesium: 1.6 mg/dL (ref 1.5–2.5)

## 2013-05-07 MED ORDER — LORAZEPAM 2 MG/ML IJ SOLN
1.0000 mg | Freq: Four times a day (QID) | INTRAMUSCULAR | Status: DC | PRN
  Administered 2013-05-07 – 2013-05-12 (×7): 2 mg via INTRAVENOUS
  Filled 2013-05-07 (×6): qty 1

## 2013-05-07 MED ORDER — ENOXAPARIN SODIUM 60 MG/0.6ML ~~LOC~~ SOLN
55.0000 mg | Freq: Once | SUBCUTANEOUS | Status: AC
  Administered 2013-05-07: 13:00:00 55 mg via SUBCUTANEOUS
  Filled 2013-05-07: qty 0.6

## 2013-05-07 MED ORDER — SODIUM CHLORIDE 0.9 % IV SOLN
INTRAVENOUS | Status: DC
  Administered 2013-05-07: 22:00:00 via INTRAVENOUS
  Administered 2013-05-07: 100 mL/h via INTRAVENOUS
  Administered 2013-05-08: 23:00:00 via INTRAVENOUS
  Administered 2013-05-08: 1000 mL via INTRAVENOUS
  Administered 2013-05-09 – 2013-05-11 (×4): via INTRAVENOUS

## 2013-05-07 MED ORDER — INSULIN GLARGINE 100 UNIT/ML ~~LOC~~ SOLN
30.0000 [IU] | Freq: Every day | SUBCUTANEOUS | Status: DC
  Administered 2013-05-07 – 2013-05-08 (×2): 30 [IU] via SUBCUTANEOUS
  Filled 2013-05-07 (×3): qty 0.3

## 2013-05-07 MED ORDER — ENOXAPARIN SODIUM 100 MG/ML ~~LOC~~ SOLN
85.0000 mg | SUBCUTANEOUS | Status: DC
  Administered 2013-05-08 – 2013-05-10 (×3): 85 mg via SUBCUTANEOUS
  Administered 2013-05-11: 0.85 mg via SUBCUTANEOUS
  Administered 2013-05-12: 10:00:00 via SUBCUTANEOUS
  Filled 2013-05-07 (×5): qty 1

## 2013-05-07 MED ORDER — LORAZEPAM 2 MG/ML IJ SOLN
INTRAMUSCULAR | Status: AC
  Filled 2013-05-07: qty 1

## 2013-05-07 NOTE — Progress Notes (Addendum)
Clinical Social Work Department CLINICAL SOCIAL WORK PLACEMENT NOTE 05/07/2013  Patient:  BETHENNY, LOSEE  Account Number:  [id number] Admit date:  04/30/2013  Clinical Social Worker:  Unk Lightning, LCSW  Date/time:  05/07/2013 11:30 AM  Clinical Social Work is seeking post-discharge placement for this patient at the following level of care:   SKILLED NURSING   (*CSW will update this form in Epic as items are completed)   05/07/2013  Patient/family provided with [facility] Department of Clinical Social Work's list of facilities offering this level of care within the geographic area requested by the patient (or if unable, by the patient's family).  05/07/2013  Patient/family informed of their freedom to choose among providers that offer the needed level of care, that participate in Medicare, Medicaid or managed care program needed by the patient, have an available bed and are willing to accept the patient.  05/07/2013  Patient/family informed of [facility]' ownership interest in [facility], as well as of the fact that they are under no obligation to receive care at this facility.  PASARR submitted to EDS on 05/07/2013 PASARR number received from EDS on 05/07/2013  FL2 transmitted to all facilities in geographic area requested by pt/family on  05/07/2013 FL2 transmitted to all facilities within larger geographic area on   Patient informed that his/her managed care company has contracts with or will negotiate with  certain facilities, including the following:     Patient/family informed of bed offers received:  05/12/13 Patient chooses bed at [facility] Physician recommends and patient chooses bed at    Patient to be transferred to [facility] on  05/12/13 Patient to be transferred to facility by [facility]  The following physician request were entered in Epic:   Additional Comments:

## 2013-05-07 NOTE — Progress Notes (Signed)
I have reviewed this note and agree with all findings. Kati Monifa Blanchette, PT, DPT Pager: [phone]   

## 2013-05-07 NOTE — Progress Notes (Signed)
Clinical Social Work  After MD met with patient, patient reports she is agreeable to SNF placement if needed. CSW explained SNF process and that since she will need LOG that search outside of the county will need to be made as well. Patient agreeable but remains hopeful that she will get better so that she can DC home.  Patient was tearful and upset during session. Patient reports that she wants to work with PT but was too tired yesterday. Patient reports she is working hard to get better and wants everyone to know she is motivated. CSW validated patient's feelings and encouraged her to contact CSW will further needs.  [place], [place] [phone]

## 2013-05-07 NOTE — Progress Notes (Signed)
Physical Therapy Treatment Patient Details Name: Karen Bennett MRN: [id number] DOB: 09/20/1975 Today's Date: 05/07/2013 Time: 2952-8413 PT Time Calculation (min): 28 min  PT Assessment / Plan / Recommendation  History of Present Illness s/p I and D left foot abcess with VAC placement   PT Comments   Pt demonstrating progress as she was able to ambulate longer distances with less assist today. Pt is very motivated and willing to participate in PT. Discharge facility to be determined, as pt receiving wound care for L foot and might require ST-SNF depending on L foot wound progress. Pt would continue to benefit from skilled PT in order to improve functional mobility and safety.  Follow Up Recommendations  Other (comment) (TBD: depending on L foot wound)     Does the patient have the potential to tolerate intense rehabilitation     Barriers to Discharge        Equipment Recommendations  Rolling walker with 5" wheels;Other (comment) (pt will possibly require bariatric RW)    Recommendations for Other Services    Frequency Min 3X/week   Progress towards PT Goals Progress towards PT goals: Goals met and updated - see care plan  Plan Current plan remains appropriate    Precautions / Restrictions Precautions Precautions: Fall Precaution Comments: DARCO shoe L  Restrictions Weight Bearing Restrictions: Yes LLE Weight Bearing: Partial weight bearing   Pertinent Vitals/Pain Pt reports L foot pain 6/10 at rest and 7-9/10 during amb. Pt took seated rest break during ambulation to decrease pain and positioned to comfort at end of session with RN in room administering pain meds.    Mobility  Bed Mobility Bed Mobility: Supine to Sit;Sit to Supine;Sitting - Scoot to Edge of Bed Supine to Sit: 5: Supervision;With rails Sitting - Scoot to Edge of Bed: 5: Supervision Sit to Supine: 5: Supervision Details for Bed Mobility Assistance: Supervision to ensure safety and for line management.  Pt reports 6/10 L foot pain at rest, RN notified and pt requested pain meds. Transfers Transfers: Sit to Stand;Stand to Sit Sit to Stand: 5: Supervision;With upper extremity assist;From bed;From toilet Stand to Sit: 5: Supervision;To toilet;To bed;With upper extremity assist Details for Transfer Assistance: Supervision to ensure safety. VC's for hand placement and to keep LLE extended during sit<>stand to adhere to PWB status. Ambulation/Gait Ambulation/Gait Assistance: 4: Min guard Ambulation Distance (Feet): 120 Feet Assistive device: Rolling walker Ambulation/Gait Assistance Details: Min guard to ensure safety. VC's to adhere to [facility], pt able to donn [facility] shoe ind. on L foot prior to amb. Pt reports L foot pain incr. to 7-9/10 during amb., pt took seated rest break halfway through to decr. pain and fatigue.  Gait Pattern: Step-to pattern;Decreased stance time - left;Decreased stride length;Antalgic Gait velocity: Decrease    Exercises     PT Diagnosis:    PT Problem List:   PT Treatment Interventions:     PT Goals (current goals can now be found in the care plan section)    Visit Information  Last PT Received On: 05/07/13 Assistance Needed: +1 History of Present Illness: s/p I and D left foot abcess with VAC placement    Subjective Data      Cognition  Cognition Arousal/Alertness: Awake/alert Behavior During Therapy: WFL for tasks assessed/performed Overall Cognitive Status: Within Functional Limits for tasks assessed    Balance  Balance Balance Assessed: Yes Dynamic Standing Balance Dynamic Standing - Balance Support: No upper extremity supported;During functional activity Dynamic Standing - Level of Assistance: 5:  Stand by assistance Dynamic Standing - Comments: pt able to stand to perform bathroom and hand hygiene for approx. 2 minutes with supervision to ensure safety.  End of Session PT - End of Session Activity Tolerance: Patient limited by fatigue;Patient  limited by pain Patient left: with call bell/phone within reach;in bed;with nursing/sitter in room Nurse Communication: Patient requests pain meds   GP     Sol Blazing 05/07/2013, 4:33 PM

## 2013-05-07 NOTE — Progress Notes (Signed)
Patient ID: Karen Bennett, female   DOB: 11-Jan-1976, 37 y.o.   MRN: [id number] Subjective: 6 Days Post-Op Procedure(s) (LRB): INCISION AND DRAINAGE LEFT FOREFOOT ABCESS  (Left) APPLICATION OF WOUND VAC (Left)Awake, alert and oriented x 4. Able to use walker to get to bathroom and back with Darco shoe. Dr. Magnus Ivan and his PA have seen Ms. Milroy in my abscence. VAC dressings Were started by Dr. Magnus Ivan along with whirlpool soaks to promote left foot drainage. Results of 10/10 CT with contrast suggest that the left foot has residual areas of concern for Possible abscesses at the 2nd and 3rd tarsal-metatarsal junctions and plantar aspect of The left midfoot laterally.  Patient reports pain as mild.    Objective:   VITALS:  Temp:  [97.4 F (36.3 C)-98.8 F (37.1 C)] 97.4 F (36.3 C) (10/15 0548) Pulse Rate:  [64-72] 64 (10/15 0548) Resp:  [18-20] 18 (10/15 0548) BP: (125-148)/(73-79) 125/79 mmHg (10/15 0548) SpO2:  [94 %-97 %] 97 % (10/15 0548)  ABD soft Intact pulses distally Dorsiflexion/Plantar flexion intact VAC intact, granulation.    LABS  Recent Labs  05/05/13 0550 05/06/13 0535 05/07/13 0425  HGB 10.2* 9.9* 9.7*  WBC 5.8 7.6 6.4  PLT 297 314 286    Recent Labs  05/06/13 0535 05/07/13 0425  NA 132* 134*  K 3.7 3.6  CL 100 99  CO2 21 22  BUN 9 12  CREATININE 1.52* 1.66*  GLUCOSE 203* 178*   No results found for this basename: LABPT, INR,  in the last 72 hours Pathology, soft tissue abscess and osteomyelitis. Intra op cultures with Grp B Strept. (Strept. Agalactiacea)  Assessment/Plan: 6 Days Post-Op Procedure(s) (LRB): INCISION AND DRAINAGE LEFT FOREFOOT ABCESS  (Left) APPLICATION OF WOUND VAC (Left) Osteomyelitis of the left 5th and 4th metatarsal bases. Pyarthrosis of the lateral MT-tarsal joints and at the 2nd and 3rd MT-T joints. Residual abscesses of the left foot plantar midfoot.  I asked Dr. Lajoyce Corners to provide a second opinion of this  patient's foot as to whether a strong consideration of BKA is warranted to control infection and provide the best opportunity to healing and rehab with prosthesis. Continue ABX therapy due to Culture taken of surgical site during I and D of the left foot. Hold discharge until second opinion is obtained as a second procedure BKA may be necessary.   Naila Elizondo E 05/07/2013, 12:19 PM

## 2013-05-07 NOTE — Progress Notes (Signed)
[facility] PROGRESS NOTE  Karen Bennett ZOX:[id number] DOB: 05/11/1976 DOA: 04/30/2013 PCP: No primary provider on file.  Assessment/Plan: 1. Osteomyelitis fourth/fifth left metatarsal; wound care per orthopedic surgery -Continue start antibiotics per ID recommendations; ceftriaxone+ metronidazole for 6 weeks -Requested PICC line placement for extended antibiotic treatment -Continue rounds of bedside hydrotherapy to left foot per surgery -Discussed with patient need to discharge to SNF i/o to provide her the best chance of salvaging her foot. Patient agreed to speak with CSW about placement  2. Diabetes type 2;, increased SSI to Resistant and increase  Lantus 30  unit QD, -Hemoglobin A1c 04/30/2013= 13 -Discussed with patient the need to obtain control of her diabetes if she wishes her wounds to heal, and does not want to lose the remainder of her foot. -Spoke with NCM Concerning obtaining a waiver for patient to be placed on Match program again in order to obtain her medication   3. Hyponatremia; continue to monitor still slightly low but stable.  -Normal saline has been stopped this point secondary to patient increasing consumption of fluid/food  4. Hypokalemia; within normal limits  5. Infection ; CBC within normal limits -Continue antibiotic ID recommendations  6. HLD; -Lipid panel; not within ADA guidelines (HDL low, TG= 520), started on Lipitor 40 mg; will also  probably require niacin  7. Phantom Pain; continue Neurontin to BID  8. Orthopedic Pain; Continue current pain regimen. Per patient controlling the pain   9. Hypomagnesemia; within normal limits continue to monitor   10. Acute renal insufficiency; patient's creatinine is trending up, reconciliation of medication reveals vancomycin being the most likely cause. Vancomycin has been DC'd.  -Will hydrate patient and follow closely  Code Status:  Full Family Communication: Disposition Plan: Per  surgery   Consultants:  Orthopedic surgery  Procedures: 05/01/2013  INCISION AND DRAINAGE LEFT FOREFOOT ABCESS (Left) by Dr Kerrin Champagne (orthopedics) . Gram stain shows few gram-positive cocci 05/03/2013 Wound cultures; RARE GROUP B STREP(S.AGALACTIAE)ISOLATED (final 05/03/2013)    Antibiotics:  Zosyn 10/9>> stopped 10/14   Vancomycin 10/9>> stopped 10/14  Ceftriaxone 10/14>> stop date 11/18  Metronidazole 10/14 stop date 11/18    HPI/Subjective: Karen Bennett is a 37 y.o. 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 In 10/2011 Dr. Doneen Poisson performed a repeat I and D of the Left foot. She has been extremely noncompliant with any followup with orthopaedics and Dr. Annell Greening was on Call at the last ER presentation for her left foot in 08/2012 and preformed an I and D of a left foot lateral abscess. There are no office notes post op and it appears as though she did not return for follow up with Dr. Ophelia Charter after that  Procedure. There are no records of Dr. Ophelia Charter being contacted about increasing problems with the left foot this Week. Karen Bennett indicates that she called Dr. Ophelia Charter and a prescription for Karen Bennett was called in for her. She presents With a two week history of increasing left foot swelling erythrema and pain. Had noticed intermittant chills, no fever. Seen in [facility] with normal WBC But definite left foot swelling erythrema and warmth consistent with a left Foot lateral abscess. Admitting MD describing as a grapefruit size swelling of the left lateral foot. xrays are concerning  For sclerotic changes of the distal resected ends of the 4th and 5th metatarsal amputation ends. 05/01/2013 just returned from surgery, positive pain,  positive inability to void urine. 05/02/2013 states feeling better except for pain in foot which she describes as electric/scrapping. Would like  Foley removed. 05/03/2013 reveals improved, decreased swelling, negative warmth to touch. Patient resting comfortably in bed for surgeons to come and change wound VAC 05/04/2013 states negative pain, and multiple questions on exactly what hydrotherapy entailed. No other complaints. 05/05/2013 states that initially anxious about the hydrotherapy for her left foot but after completed felt well. 05/06/2013 patient resting comfortably in bed, states the foot pain is approximately 8/10 and has not had her pain medication this a.m. TODAY patient upset when I asked her about why she did not participate with PT yesterday. States yesterday she just did not feel up to participating but understands that she must participate in order to improve chance of maintaining left foot.     Objective: Filed Vitals:   05/06/13 0453 05/06/13 1500 05/06/13 2300 05/07/13 0548  BP: 123/78 148/74 139/73 125/79  Pulse: 79 72 69 64  Temp:  98.5 F (36.9 C) 98.8 F (37.1 C) 97.4 F (36.3 C)  TempSrc: Oral Oral Oral Oral  Resp: 20 20 20 18   Height:      Weight:      SpO2: 93% 94% 97% 97%    Intake/Output Summary (Last 24 hours) at 05/07/13 0840 Last data filed at 05/07/13 0600  Gross per 24 hour  Intake    440 ml  Output      0 ml  Net    440 ml   Filed Weights   04/30/13 1856 04/30/13 2100  Weight: 163.295 kg (360 lb) 170.7 kg (376 lb 5.2 oz)    Exam:   General:  A./O. X4, NAD  Cardiovascular: Regular rhythm and rate, negative murmurs rubs gallops   Respiratory: Good auscultation  Abdomen: obese, nontender, plus bowel some  Musculoskeletal: Left foot wound VAC present over the remnants of fourth and fifth metatarsal. Negative warm to touch, swelling almost completely back to baseline     Data Reviewed: Basic Metabolic Panel:  Recent Labs Lab 05/03/13 0605 05/04/13 0530 05/05/13 0550 05/06/13 0535 05/07/13 0425  NA 130* 131* 132* 132* 134*  K 3.6 3.3* 3.4* 3.7 3.6  CL 96 98 102 100 99  CO2  24 22 22 21 22   GLUCOSE 267* 262* 223* 203* 178*  BUN 7 6 6 9 12   CREATININE 0.60 0.51 0.74 1.52* 1.66*  CALCIUM 9.1 9.4 9.1 9.3 9.5  MG 1.5 1.4* 1.5 1.7 1.6   Liver Function Tests:  Recent Labs Lab 05/03/13 0605 05/04/13 0530 05/05/13 0550 05/06/13 0535 05/07/13 0425  AST 10 10 19 14 12   ALT 13 11 13 13 11   ALKPHOS 67 59 49 47 46  BILITOT 0.6 0.4 0.4 0.4 0.3  PROT 6.5 6.7 6.4 6.7 6.6  ALBUMIN 2.2* 2.4* 2.3* 2.4* 2.5*   No results found for this basename: LIPASE, AMYLASE,  in the last 168 hours No results found for this basename: AMMONIA,  in the last 168 hours CBC:  Recent Labs Lab 05/03/13 0605 05/04/13 0530 05/05/13 0550 05/06/13 0535 05/07/13 0425  WBC 6.4 7.0 5.8 7.6 6.4  NEUTROABS 3.0 3.1 2.7 4.6 3.7  HGB 10.6* 10.2* 10.2* 9.9* 9.7*  HCT 31.0* 30.4* 30.1* 29.4* 28.7*  MCV 85.9 85.6 86.5 87.2 87.2  PLT 232 274 297 314 286   Cardiac Enzymes: No results found for this basename: CKTOTAL, CKMB, CKMBINDEX, TROPONINI,  in the last 168 hours BNP (last 3 results) No results  found for this basename: PROBNP,  in the last 8760 hours CBG:  Recent Labs Lab 05/06/13 1613 05/06/13 1957 05/06/13 2327 05/07/13 0352 05/07/13 0720  GLUCAP 248* 253* 175* 154* 173*    Recent Results (from the past 240 hour(s))  CULTURE, ROUTINE-ABSCESS     Status: None   Collection Time    04/30/13 11:00 PM      Result Value Range Status   Specimen Description ABSCESS LEFT FOOT   Final   Special Requests NONE   Final   Gram Stain     Final   Value: RARE WBC PRESENT, PREDOMINANTLY PMN     NO SQUAMOUS EPITHELIAL CELLS SEEN     NO ORGANISMS SEEN     Performed at [facility]   Culture     Final   Value: RARE GROUP B STREP(S.AGALACTIAE)ISOLATED     Note: TESTING AGAINST S. AGALACTIAE NOT ROUTINELY PERFORMED DUE TO PREDICTABILITY OF AMP/PEN/VAN SUSCEPTIBILITY.     Performed at [facility]   Report Status 05/03/2013 FINAL   Final  ANAEROBIC CULTURE     Status: None    Collection Time    05/01/13  2:08 PM      Result Value Range Status   Specimen Description WOUND ABSCESS LEFT FOOT   Final   Special Requests PATIENT ON FOLLOWING VANCOMYCIN 1.5G ZOSYN 3.375G   Final   Gram Stain     Final   Value: RARE WBC PRESENT,BOTH PMN AND MONONUCLEAR     NO SQUAMOUS EPITHELIAL CELLS SEEN     RARE GRAM POSITIVE COCCI     IN PAIRS     Performed at [facility]   Culture     Final   Value: NO ANAEROBES ISOLATED     Performed at [facility]   Report Status 05/06/2013 FINAL   Final  WOUND CULTURE     Status: None   Collection Time    05/01/13  2:08 PM      Result Value Range Status   Specimen Description WOUND ABSCESS LEFT FOOT   Final   Special Requests PATIENT ON FOLLOWING VANCOMYCIN 1.5G ZOSYN 3.375G   Final   Gram Stain     Final   Value: RARE WBC PRESENT,BOTH PMN AND MONONUCLEAR     NO SQUAMOUS EPITHELIAL CELLS SEEN     RARE GRAM POSITIVE COCCI     IN PAIRS     Performed at [facility]   Culture     Final   Value: FEW GROUP B STREP(S.AGALACTIAE)ISOLATED     Note: TESTING AGAINST S. AGALACTIAE NOT ROUTINELY PERFORMED DUE TO PREDICTABILITY OF AMP/PEN/VAN SUSCEPTIBILITY.     Performed at [facility]   Report Status 05/03/2013 FINAL   Final  GRAM STAIN     Status: None   Collection Time    05/01/13  2:08 PM      Result Value Range Status   Specimen Description WOUND ABSCESS LEFT FOOT   Final   Special Requests PATIENT ON FOLLOWING VANCOMYCIN 1.5G ZOSYN 3.375G   Final   Gram Stain     Final   Value: NO ORGANISMS SEEN     NO WBC SEEN     Gram Stain Report Called to,Read Back By and Verified With: EDGALL,E. AT 1618 ON 10.09.14 BY LOVE,T.   Report Status 05/01/2013 FINAL   Final  TISSUE CULTURE     Status: None   Collection Time    05/01/13  2:14 PM  Result Value Range Status   Specimen Description FOOT LEFT METATARSAL   Final   Special Requests PATIENT ON FOLLOWING VANCOMYCIN 1.5G ZOSYN 3.375G   Final   Gram  Stain     Final   Value: NO WBC SEEN     NO ORGANISMS SEEN     Performed at [facility]   Culture     Final   Value: FEW GROUP B STREP(S.AGALACTIAE)ISOLATED     Note: TESTING AGAINST S. AGALACTIAE NOT ROUTINELY PERFORMED DUE TO PREDICTABILITY OF AMP/PEN/VAN SUSCEPTIBILITY.     Performed at [facility]   Report Status 05/04/2013 FINAL   Final  ANAEROBIC CULTURE     Status: None   Collection Time    05/01/13  2:14 PM      Result Value Range Status   Specimen Description FOOT LEFT METATARSAL   Final   Special Requests PATIENT ON FOLLOWING VANCOMYCIN 1.5G ZOSYN 3.375G   Final   Gram Stain     Final   Value: NO WBC SEEN     NO ORGANISMS SEEN     Performed at [facility]   Culture     Final   Value: NO ANAEROBES ISOLATED     Performed at [facility]   Report Status 05/06/2013 FINAL   Final  GRAM STAIN     Status: None   Collection Time    05/01/13  2:14 PM      Result Value Range Status   Specimen Description FOOT LEFT METATARSAL   Final   Special Requests PATIENT ON FOLLOWING VANCOMYCIN 1.5G ZOSYN 3.375G   Final   Gram Stain     Final   Value: NO WBC SEEN     FEW GRAM POSITIVE COCCI     Gram Stain Report Called to,Read Back By and Verified With: EDGAL,E. AT 1617 ON 10.09.14 BY LOVE,T.   Report Status 05/01/2013 FINAL   Final  MRSA PCR SCREENING     Status: None   Collection Time    05/04/13  8:18 AM      Result Value Range Status   MRSA by PCR NEGATIVE  NEGATIVE Final   Comment:            The GeneXpert MRSA Assay (FDA     approved for NASAL specimens     only), is one component of a     comprehensive MRSA colonization     surveillance program. It is not     intended to diagnose MRSA     infection nor to guide or     monitor treatment for     MRSA infections.     Studies: No results found.  Scheduled Meds: . atorvastatin  40 mg Oral q1800  . cefTRIAXone (ROCEPHIN)  IV  2 g Intravenous Q24H  . enoxaparin (LOVENOX) injection  30 mg  Subcutaneous Q24H  . ferrous sulfate  325 mg Oral TID PC  . gabapentin  300 mg Oral BID  . insulin aspart  0-20 Units Subcutaneous Q4H  . insulin glargine  22 Units Subcutaneous Daily  . magnesium sulfate 1 - 4 g bolus IVPB  2 g Intravenous Once  . metroNIDAZOLE  500 mg Oral Q8H   Continuous Infusions:   Principal Problem:   Diabetic foot ulcer Active Problems:   DM (diabetes mellitus), type 2, uncontrolled with complications   Obesity, Class III, BMI 40-49.9 (morbid obesity)   Hyponatremia   Osteomyelitis   Cellulitis and abscess of  leg, except foot   Diabetic peripheral neuropathy associated with type 2 diabetes mellitus   Hypokalemia   HLD (hyperlipidemia)   Hypomagnesemia   Phantom limb pain    Time spent: 35 minutes    WOODS, CURTIS, J  [facility] Pager [phone]. If 7PM-7AM, please contact night-coverage at [email or web address], password [facility] 05/07/2013, 8:40 AM  LOS: 7 days

## 2013-05-07 NOTE — Progress Notes (Signed)
PT HYDROTHERAPY NOTE    05/07/13 1400  Subjective Assessment  Subjective I am sorry I am just sad today taking all the news in . (Pt very emotional but able to comfort)  Patient and Family Stated Goals I know I will do what ever is best for me, I am trying so hard to do everything right.   Date of Onset 04/21/13  Evaluation and Treatment  Evaluation and Treatment Procedures Explained to Patient/Family Yes  Evaluation and Treatment Procedures agreed to  Wound 05/05/13 Diabetic ulcer Foot Left L lateral foot I & D   Date First Assessed/Time First Assessed: 05/05/13 1030   Wound Type: Diabetic ulcer  Location: Foot  Location Orientation: Left  Wound Description (Comments): L lateral foot I & D   Present on Admission: Yes  Site / Wound Assessment Pink;Red  % Wound base Red or Granulating 80%  % Wound base Yellow 10%  % Wound base Black 10%  Peri-wound Assessment Maceration;Edema  Margins Unattacted edges (unapproximated)  Closure None  Drainage Amount Minimal  Drainage Description Serosanguineous  Non-staged Wound Description Full thickness  Dressing Changed Changed  Dressing Status Dry;Intact;Clean (VAC placed by nursing)  Hydrotherapy  Pulsed Lavage with Suction (psi) 8 psi (varied speed from 4-12 psi)  Pulsed Lavage with Suction - Normal Saline Used 1000 mL  Pulsed Lavage Tip Tip with splash shield  Pulsed lavage therapy - wound location L lateral foot  Selective Debridement  Selective Debridement - Location L Lateral foot 9:00-12:00 location (and under the "finger flap at 9:00")  Selective Debridement - Tools Used Forceps;Scissors  Selective Debridement - Tissue Removed brown and yellow necrotic tissue  Wound Therapy - Assess/Plan/Recommendations  Wound Therapy - Clinical Statement very similar to yesterday will slightly less necrotic tissue. Just noted surround area around open woun from 9-12 with outer layer of blister removed from small separate area at 11:00. Pt very  emotuonal today with news of possibilites of foot healing and possibilites of SNF placement.   Wound Therapy - Functional Problem List Chronic diabetic wound  Factors Delaying/Impairing Wound Healing Diabetes Mellitus  Hydrotherapy Plan Debridement;Patient/family education;Pulsatile lavage with suction  Wound Therapy - Frequency 6X / week  Wound Therapy - Current Recommendations Case manager/social work;Diabetic teaching;Nutritionist  Wound Therapy - Follow Up Recommendations Home health RN;Skilled nursing facility (weighing options of best care in controlled [place]virnom[place] due limited resources at home for pt )  Wound Plan to continue to provide pulsed lavage with debridement once less than 5% , may need to decrease frequency in order to not be disruptive to granualating tissue in VAC placment and decrease to 3x /week or none once wound bed imporves.   Wound Therapy Goals - Improve the function of patient's integumentary system by progressing the wound(s) through the phases of wound healing by:  Decrease Necrotic Tissue to less than 5%  Decrease Necrotic Tissue - Progress Progressing toward goal  Increase Granulation Tissue to 95% or greater  Increase Granulation Tissue - Progress Progressing toward goal  Marella Bile, PT Pager: [phone] 05/07/2013

## 2013-05-08 DIAGNOSIS — L0291 Cutaneous abscess, unspecified: Secondary | ICD-10-CM

## 2013-05-08 DIAGNOSIS — D72829 Elevated white blood cell count, unspecified: Secondary | ICD-10-CM

## 2013-05-08 LAB — CBC WITH DIFFERENTIAL/PLATELET
Basophils Absolute: 0 10*3/uL (ref 0.0–0.1)
Eosinophils Absolute: 0.2 10*3/uL (ref 0.0–0.7)
Hemoglobin: 9.4 g/dL — ABNORMAL LOW (ref 12.0–15.0)
Lymphs Abs: 1.9 10*3/uL (ref 0.7–4.0)
MCH: 28.7 pg (ref 26.0–34.0)
Monocytes Relative: 9 % (ref 3–12)
Neutrophils Relative %: 59 % (ref 43–77)
RBC: 3.28 MIL/uL — ABNORMAL LOW (ref 3.87–5.11)

## 2013-05-08 LAB — GLUCOSE, CAPILLARY: Glucose-Capillary: 195 mg/dL — ABNORMAL HIGH (ref 70–99)

## 2013-05-08 LAB — C-REACTIVE PROTEIN: CRP: 3 mg/dL — ABNORMAL HIGH (ref <0.60)

## 2013-05-08 LAB — COMPREHENSIVE METABOLIC PANEL
AST: 10 U/L (ref 0–37)
Albumin: 2.6 g/dL — ABNORMAL LOW (ref 3.5–5.2)
Alkaline Phosphatase: 45 U/L (ref 39–117)
CO2: 22 mEq/L (ref 19–32)
Calcium: 9 mg/dL (ref 8.4–10.5)
Creatinine, Ser: 1.74 mg/dL — ABNORMAL HIGH (ref 0.50–1.10)
GFR calc non Af Amer: 37 mL/min — ABNORMAL LOW (ref >90)
Potassium: 3.9 mEq/L (ref 3.5–5.1)
Total Protein: 6.9 g/dL (ref 6.0–8.3)

## 2013-05-08 MED ORDER — INSULIN GLARGINE 100 UNIT/ML ~~LOC~~ SOLN
40.0000 [IU] | Freq: Every day | SUBCUTANEOUS | Status: DC
  Administered 2013-05-09 – 2013-05-12 (×4): 40 [IU] via SUBCUTANEOUS
  Filled 2013-05-08 (×4): qty 0.4

## 2013-05-08 NOTE — Progress Notes (Signed)
Physical Therapy Treatment Patient Details Name: Karen Bennett MRN: [id number] DOB: [birth year] Today's Date: 05/08/2013 Time: 9147-8295 PT Time Calculation (min): 15 min  PT Assessment / Plan / Recommendation  History of Present Illness s/p I and D left foot abcess with VAC placement   PT Comments   Pt is progressing, limited more by "burning" pain in left foot  Follow Up Recommendations  Other (comment) (depending on L foot wound)     Does the patient have the potential to tolerate intense rehabilitation     Barriers to Discharge        Equipment Recommendations  Rolling walker with 5" wheels (bariatric RW)    Recommendations for Other Services    Frequency Min 3X/week   Progress towards PT Goals Progress towards PT goals: Progressing toward goals  Plan Current plan remains appropriate    Precautions / Restrictions Precautions Precautions: Fall Required Braces or Orthoses:  (due to lines) Restrictions LLE Weight Bearing: Partial weight bearing Other Position/Activity Restrictions: pt verbalizes need for shoe and PWB   Pertinent Vitals/Pain     Mobility  Bed Mobility Bed Mobility: Supine to Sit;Sitting - Scoot to Edge of Bed Supine to Sit: 6: Modified independent (Device/Increase time) Sitting - Scoot to Edge of Bed: 5: Supervision Details for Bed Mobility Assistance: supervision for safety and line management while scooting to EOB Transfers Transfers: Sit to Stand;Stand to Sit Sit to Stand: 5: Supervision;6: Modified independent (Device/Increase time) Stand to Sit: 5: Supervision;To chair/3-in-1;With upper extremity assist Details for Transfer Assistance: Supervision to ensure safety. VC's for hand placement and to keep LLE extended during sit<>stand to adhere to PWB status. Ambulation/Gait Ambulation/Gait Assistance: 4: Min guard;5: Supervision Ambulation Distance (Feet): 70 Feet Assistive device: Rolling walker Ambulation/Gait Assistance Details: verbal  cues for PWB, sequence and use of UEs Gait Pattern: Step-to pattern;Decreased stance time - left;Decreased stride length;Antalgic    Exercises     PT Diagnosis:    PT Problem List:   PT Treatment Interventions:     PT Goals (current goals can now be found in the care plan section) Acute Rehab PT Goals Patient Stated Goal: home Time For Goal Achievement: 05/12/13 Potential to Achieve Goals: Good  Visit Information  Last PT Received On: 05/08/13 Assistance Needed: +1 History of Present Illness: s/p I and D left foot abcess with VAC placement    Subjective Data  Subjective: ok, i can try Patient Stated Goal: home   Cognition  Cognition Arousal/Alertness: Awake/alert Behavior During Therapy: WFL for tasks assessed/performed Overall Cognitive Status: Within Functional Limits for tasks assessed    Balance  Dynamic Standing Balance Dynamic Standing - Balance Support: No upper extremity supported;During functional activity Dynamic Standing - Level of Assistance: 6: Modified independent (Device/Increase time) Dynamic Standing - Balance Activities: Lateral lean/weight shifting  End of Session PT - End of Session Activity Tolerance: Patient limited by pain Patient left: in chair;with call bell/phone within reach Nurse Communication: Patient requests pain meds   GP     [facility] 05/08/2013, 3:04 PM

## 2013-05-08 NOTE — Progress Notes (Signed)
05/08/13 1323  Subjective Assessment  Subjective Thank you for doing what you do. I just am trying to take all of this in and it is hard for me.  Date of Onset 04/21/13  Evaluation and Treatment  Evaluation and Treatment Procedures Explained to Patient/Family Yes  Evaluation and Treatment Procedures agreed to  Wound 05/05/13 Diabetic ulcer Foot Left L lateral foot I & D   Date First Assessed/Time First Assessed: 05/05/13 1030   Wound Type: Diabetic ulcer  Location: Foot  Location Orientation: Left  Wound Description (Comments): L lateral foot I & D   Present on Admission: Yes  Site / Wound Assessment Brown;Granulation tissue;Painful;Yellow  % Wound base Red or Granulating 85%  % Wound base Yellow 10%  % Wound base Black 5%  Peri-wound Assessment Maceration;Edema (improving, and still partial thickness area at 11:00 1.5X1cm)  Margins Unattacted edges (unapproximated)  Closure None  Drainage Amount Minimal (with VAC)  Drainage Description Serosanguineous  Non-staged Wound Description Full thickness  Treatment Debridement (Selective);Hydrotherapy (Pulse lavage);Cleansed;Negative pressure wound therapy  Dressing Type (VAC placed by [facility] nurse)  Dressing Changed New  Dressing Status Dry;Clean;Intact  Hydrotherapy  Pulsed Lavage with Suction (psi) 8 psi (4-12 varied)  Pulsed Lavage with Suction - Normal Saline Used 1000 mL  Pulsed Lavage Tip Tip with splash shield  Pulsed lavage therapy - wound location L lateral foot  Selective Debridement  Selective Debridement - Location L Lateral foot 9:00-12:00 location  Selective Debridement - Tools Used Forceps;Scissors  Selective Debridement - Tissue Removed brown and yellow necrotic tissue  Wound Therapy - Assess/Plan/Recommendations  Wound Therapy - Clinical Statement necrotic areas decreasing and surrounding tissues seem to be improving slightly except for the smaller area at 11:00 which started as a blister area.   Wound Therapy -  Functional Problem List Chronic diabetic wound  Factors Delaying/Impairing Wound Healing Diabetes Mellitus  Hydrotherapy Plan Debridement;Patient/family education;Pulsatile lavage with suction  Wound Therapy - Frequency 6X / week  Wound Therapy - Current Recommendations Case manager/social work;Diabetic teaching;Nutritionist  Wound Therapy - Follow Up Recommendations Home health RN;Skilled nursing facility;Wound Care Center (wieghinh options with other disciplines for best care)  Wound Plan to continue to provide pulsed lavage with debridement once less than 5% , may need to decrease frequency in order to not be disruptive to granualating tissue in VAC placment and decrease to 3x /week or none once wound bed imporves.   Wound Therapy Goals - Improve the function of patient's integumentary system by progressing the wound(s) through the phases of wound healing by:  Decrease Necrotic Tissue to less than 5%  Decrease Necrotic Tissue - Progress Progressing toward goal  Increase Granulation Tissue to 95% or greater  Increase Granulation Tissue - Progress Progressing toward goal  Marella Bile, PT Pager: [phone] 05/08/2013

## 2013-05-08 NOTE — Consult Note (Signed)
  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 Examination patient is no a sending cellulitis. The wound VAC is removed and patient has good beefy granulation tissue at the base of the wound. The wound is essentially 7 cm in diameter and about 2 cm deep. The mid aspect of the wound does probe down to exposed bone. The wound VAC was reapplied and suction regained. Patient does not have active eversion.   Discussed with the patient that I feel that she would have a prolonged course for wound healing and feel that even after wound healing her foot would supinate with loss of the peroneus brevis apposition. And she will also have an underlying chronic osteomyelitis which would cause recurrent infections.   It was my recommendation to her that she proceed with a transtibial amputation. Feel that this would be more durable for the patient. Feel that this should still be a technical challenge with both surgery and with fitting her for a prosthesis.

## 2013-05-08 NOTE — Progress Notes (Signed)
[facility]  Day #3 ceftriaxone Day #3 flagyl  Subjective: No new complaints   Antibiotics:  Anti-infectives   Start     Dose/Rate Route Frequency Ordered Stop   05/06/13 1500  metroNIDAZOLE (FLAGYL) tablet 500 mg     500 mg Oral 3 times per day 05/06/13 1417     05/06/13 1430  cefTRIAXone (ROCEPHIN) 2 g in dextrose 5 % 50 mL IVPB     2 g 2 mL/hr over 1,500 Minutes Intravenous Every 24 hours 05/06/13 1417     05/03/13 0400  vancomycin (VANCOCIN) 1,250 mg in sodium chloride 0.9 % 250 mL IVPB  Status:  Discontinued     1,250 mg 166.7 mL/hr over 90 Minutes Intravenous Every 8 hours 05/02/13 2052 05/06/13 1417   05/01/13 1434  polymyxin B 500,000 Units, bacitracin 50,000 Units in sodium chloride irrigation 0.9 % 500 mL irrigation  Status:  Discontinued       As needed 05/01/13 1434 05/01/13 1501   05/01/13 0800  vancomycin (VANCOCIN) 1,500 mg in sodium chloride 0.9 % 500 mL IVPB  Status:  Discontinued     1,500 mg 250 mL/hr over 120 Minutes Intravenous Every 12 hours 04/30/13 1956 05/02/13 2052   05/01/13 0200  piperacillin-tazobactam (ZOSYN) IVPB 3.375 g  Status:  Discontinued     3.375 g 12.5 mL/hr over 240 Minutes Intravenous Every 8 hours 04/30/13 2215 05/06/13 1417   04/30/13 2000  vancomycin (VANCOCIN) 2,500 mg in sodium chloride 0.9 % 500 mL IVPB     2,500 mg 250 mL/hr over 120 Minutes Intravenous  Once 04/30/13 1909 04/30/13 2209   04/30/13 1915  piperacillin-tazobactam (ZOSYN) IVPB 3.375 g     3.375 g 100 mL/hr over 30 Minutes Intravenous  Once 04/30/13 1915 04/30/13 2007   04/30/13 1845  piperacillin-tazobactam (ZOSYN) IVPB 4.5 g  Status:  Discontinued     4.5 g 200 mL/hr over 30 Minutes Intravenous  Once 04/30/13 1842 04/30/13 1914      Medications: Scheduled Meds: . atorvastatin  40 mg Oral q1800  . cefTRIAXone (ROCEPHIN)  IV  2 g Intravenous Q24H  . enoxaparin (LOVENOX) injection  85 mg Subcutaneous Q24H  . ferrous sulfate  325 mg  Oral TID PC  . gabapentin  300 mg Oral BID  . insulin aspart  0-20 Units Subcutaneous Q4H  . insulin glargine  30 Units Subcutaneous Daily  . magnesium sulfate 1 - 4 g bolus IVPB  2 g Intravenous Once  . metroNIDAZOLE  500 mg Oral Q8H   Continuous Infusions: . sodium chloride 1,000 mL (05/08/13 0822)   PRN Meds:.HYDROmorphone (DILAUDID) injection, LORazepam, metoCLOPramide (REGLAN) injection, metoCLOPramide, oxyCODONE-acetaminophen, phenol, promethazine, sodium chloride, zolpidem   Objective: Weight change:   Intake/Output Summary (Last 24 hours) at 05/08/13 1246 Last data filed at 05/08/13 1200  Gross per 24 hour  Intake 2576.67 ml  Output      0 ml  Net 2576.67 ml   Blood pressure 150/85, pulse 89, temperature 98 F (36.7 C), temperature source Oral, resp. rate 20, height 5' 8.11" (1.73 m), weight 376 lb 5.2 oz (170.7 kg), last menstrual period 04/13/2013, SpO2 95.00%. Temp:  [97.7 F (36.5 C)-98.8 F (37.1 C)] 98 F (36.7 C) (10/16 1100) Pulse Rate:  [69-89] 89 (10/16 1100) Resp:  [18-20] 20 (10/16 1100) BP: (101-153)/(71-85) 150/85 mmHg (10/16 1100) SpO2:  [95 %-100 %] 95 % (10/16 1100)  Physical Exam: General: Alert and awake, oriented x3, not in any acute  distress, obese  HEENT: anicteric sclera,  EOMI, oropharynx clear and without exudate  CVS regular rate, normal r, no murmur rubs or gallops  Chest: clear to auscultation bilaterally, no wheezing, rales or rhonchi  Abdomen: soft nontender, nondistended, normal bowel sounds,  Extremities: left foot with large wound with necrotic areas,   Neuro: nonfocal,    Lab Results:  Recent Labs  05/07/13 0425 05/08/13 0530  WBC 6.4 6.7  HGB 9.7* 9.4*  HCT 28.7* 28.7*  PLT 286 285    BMET  Recent Labs  05/07/13 0425 05/08/13 0530  NA 134* 134*  K 3.6 3.9  CL 99 102  CO2 22 22  GLUCOSE 178* 199*  BUN 12 12  CREATININE 1.66* 1.74*  CALCIUM 9.5 9.0    Micro Results: Recent Results (from the past 240  hour(s))  CULTURE, ROUTINE-ABSCESS     Status: None   Collection Time    04/30/13 11:00 PM      Result Value Range Status   Specimen Description ABSCESS LEFT FOOT   Final   Special Requests NONE   Final   Gram Stain     Final   Value: RARE WBC PRESENT, PREDOMINANTLY PMN     NO SQUAMOUS EPITHELIAL CELLS SEEN     NO ORGANISMS SEEN     Performed at [facility]   Culture     Final   Value: RARE GROUP B STREP(S.AGALACTIAE)ISOLATED     Note: TESTING AGAINST S. AGALACTIAE NOT ROUTINELY PERFORMED DUE TO PREDICTABILITY OF AMP/PEN/VAN SUSCEPTIBILITY.     Performed at [facility]   Report Status 05/03/2013 FINAL   Final  ANAEROBIC CULTURE     Status: None   Collection Time    05/01/13  2:08 PM      Result Value Range Status   Specimen Description WOUND ABSCESS LEFT FOOT   Final   Special Requests PATIENT ON FOLLOWING VANCOMYCIN 1.5G ZOSYN 3.375G   Final   Gram Stain     Final   Value: RARE WBC PRESENT,BOTH PMN AND MONONUCLEAR     NO SQUAMOUS EPITHELIAL CELLS SEEN     RARE GRAM POSITIVE COCCI     IN PAIRS     Performed at [facility]   Culture     Final   Value: NO ANAEROBES ISOLATED     Performed at [facility]   Report Status 05/06/2013 FINAL   Final  WOUND CULTURE     Status: None   Collection Time    05/01/13  2:08 PM      Result Value Range Status   Specimen Description WOUND ABSCESS LEFT FOOT   Final   Special Requests PATIENT ON FOLLOWING VANCOMYCIN 1.5G ZOSYN 3.375G   Final   Gram Stain     Final   Value: RARE WBC PRESENT,BOTH PMN AND MONONUCLEAR     NO SQUAMOUS EPITHELIAL CELLS SEEN     RARE GRAM POSITIVE COCCI     IN PAIRS     Performed at [facility]   Culture     Final   Value: FEW GROUP B STREP(S.AGALACTIAE)ISOLATED     Note: TESTING AGAINST S. AGALACTIAE NOT ROUTINELY PERFORMED DUE TO PREDICTABILITY OF AMP/PEN/VAN SUSCEPTIBILITY.     Performed at [facility]   Report Status 05/03/2013 FINAL   Final  GRAM STAIN      Status: None   Collection Time    05/01/13  2:08 PM      Result  Value Range Status   Specimen Description WOUND ABSCESS LEFT FOOT   Final   Special Requests PATIENT ON FOLLOWING VANCOMYCIN 1.5G ZOSYN 3.375G   Final   Gram Stain     Final   Value: NO ORGANISMS SEEN     NO WBC SEEN     Gram Stain Report Called to,Read Back By and Verified With: EDGALL,E. AT 1618 ON 10.09.14 BY LOVE,T.   Report Status 05/01/2013 FINAL   Final  TISSUE CULTURE     Status: None   Collection Time    05/01/13  2:14 PM      Result Value Range Status   Specimen Description FOOT LEFT METATARSAL   Final   Special Requests PATIENT ON FOLLOWING VANCOMYCIN 1.5G ZOSYN 3.375G   Final   Gram Stain     Final   Value: NO WBC SEEN     NO ORGANISMS SEEN     Performed at [facility]   Culture     Final   Value: FEW GROUP B STREP(S.AGALACTIAE)ISOLATED     Note: TESTING AGAINST S. AGALACTIAE NOT ROUTINELY PERFORMED DUE TO PREDICTABILITY OF AMP/PEN/VAN SUSCEPTIBILITY.     Performed at [facility]   Report Status 05/04/2013 FINAL   Final  ANAEROBIC CULTURE     Status: None   Collection Time    05/01/13  2:14 PM      Result Value Range Status   Specimen Description FOOT LEFT METATARSAL   Final   Special Requests PATIENT ON FOLLOWING VANCOMYCIN 1.5G ZOSYN 3.375G   Final   Gram Stain     Final   Value: NO WBC SEEN     NO ORGANISMS SEEN     Performed at [facility]   Culture     Final   Value: NO ANAEROBES ISOLATED     Performed at [facility]   Report Status 05/06/2013 FINAL   Final  GRAM STAIN     Status: None   Collection Time    05/01/13  2:14 PM      Result Value Range Status   Specimen Description FOOT LEFT METATARSAL   Final   Special Requests PATIENT ON FOLLOWING VANCOMYCIN 1.5G ZOSYN 3.375G   Final   Gram Stain     Final   Value: NO WBC SEEN     FEW GRAM POSITIVE COCCI     Gram Stain Report Called to,Read Back By and Verified With: EDGAL,E. AT 1617 ON 10.09.14 BY  LOVE,T.   Report Status 05/01/2013 FINAL   Final  MRSA PCR SCREENING     Status: None   Collection Time    05/04/13  8:18 AM      Result Value Range Status   MRSA by PCR NEGATIVE  NEGATIVE Final   Comment:            The GeneXpert MRSA Assay (FDA     approved for NASAL specimens     only), is one component of a     comprehensive MRSA colonization     surveillance program. It is not     intended to diagnose MRSA     infection nor to guide or     monitor treatment for     MRSA infections.    Studies/Results: No results found.    Assessment/Plan: Karen Bennett is a 36 y.o. female with recent amputation site abscess with MSSA and MSSA bacteremia in February, now admitted with abscess at the same site sp  I andD with GBS from cultures   #1 Foot abscess Osteomyelitis with GBS in Diabetic:   I am narrowing her to Rocephin daily and flagyl in case she had concommittant anerobes playing a role.   I am remain concerneabout her ability to salvage her amputation site. This is the second hospitalization in past 8 months and the last time she actually became bacteremic. EVen if We do cure her current infection I am worried she will yet again succumb to infection at this site   Dr Lajoyce Corners coming for 2nd opinion per Dr Otelia Sergeant  For now would plan on at least 6 weeks of postop rocephin +/- oral flagyl (if she can tolerate this oral antibiotic   #2 Screening:  HIV, hep panel  Negative  #3 DM: control of her DM is critical for her wound healing etc    LOS: 8 days   Acey Lav 05/08/2013, 12:46 PM

## 2013-05-08 NOTE — Progress Notes (Signed)
[facility] PROGRESS NOTE  Beaver Diss YNW:[id number] DOB: 29-Oct-1975 DOA: 04/30/2013 PCP: No primary provider on file.  Assessment/Plan: 1. Osteomyelitis fourth/fifth left metatarsal; wound care per orthopedic surgery -Continue start antibiotics per ID recommendations; ceftriaxone+ metronidazole for 6 weeks -PICC line placed for 6 weeks of antibiotics t -Continue rounds of bedside hydrotherapy to left foot per surgery -Discussed with patient need to discharge to SNF i/o to provide her the best chance of salvaging her foot. Patient agreed to speak with CSW about placement  2. Diabetes type 2;, increased SSI to Resistant and increase  Lantus 40 unit QD, -Hemoglobin A1c 04/30/2013= 13 -Discussed with patient the need to obtain control of her diabetes if she wishes her wounds to heal, and does not want to lose the remainder of her foot. -Spoke with NCM Concerning obtaining a waiver for patient to be placed on Match program again in order to obtain her medication   3. Hyponatremia; continue to monitor still slightly low but stable.  -Normal saline has been stopped this point secondary to patient increasing consumption of fluid/food  4. Hypokalemia; within normal limits  5. Infection ; CBC within normal limits -Continue antibiotic ID recommendations  6. HLD; -Lipid panel; not within ADA guidelines (HDL low, TG= 520), started on Lipitor 40 mg; will also  probably require niacin  7. Phantom Pain; continue Neurontin to BID  8. Orthopedic Pain; Continue current pain regimen. Per patient controlling the pain   9. Hypomagnesemia; within normal limits continue to monitor   10. Acute renal insufficiency; patient's creatinine is trending up, reconciliation of medication reveals vancomycin and amiodarone being the most likely cause. Vancomycin has been DC'd. Amiodarone has been DC'd -Will hydrate patient and follow closely  Code Status:  Full Family Communication: Disposition Plan:  Per surgery   Consultants:  Orthopedic surgery  Procedures: 05/01/2013  INCISION AND DRAINAGE LEFT FOREFOOT ABCESS (Left) by Dr Kerrin Champagne (orthopedics) . Gram stain shows few gram-positive cocci 05/03/2013 Wound cultures; RARE GROUP B STREP(S.AGALACTIAE)ISOLATED (final 05/03/2013)    Antibiotics:  Zosyn 10/9>> stopped 10/14   Vancomycin 10/9>> stopped 10/14  Ceftriaxone 10/14>> stop date 11/18  Metronidazole 10/14 stop date 11/18    HPI/Subjective: Karen Bennett Bennett is a 37 y.o. 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 In 10/2011 Dr. Doneen Poisson performed a repeat I and D of the Left foot. She has been extremely noncompliant with any followup with orthopaedics and Dr. Annell Greening was on Call at the last ER presentation for her left foot in 08/2012 and preformed an I and D of a left foot lateral abscess. There are no office notes post op and it appears as though she did not return for follow up with Dr. Ophelia Charter after that  Procedure. There are no records of Dr. Ophelia Charter being contacted about increasing problems with the left foot this Week. Karen Bennett Bennett indicates that she called Dr. Ophelia Charter and a prescription for Keflex was called in for her. She presents With a two week history of increasing left foot swelling erythrema and pain. Had noticed intermittant chills, no fever. Seen in [facility] with normal WBC But definite left foot swelling erythrema and warmth consistent with a left Foot lateral abscess. Admitting MD describing as a grapefruit size swelling of the left lateral foot. xrays are concerning  For sclerotic changes of the distal resected ends of the 4th and 5th metatarsal amputation ends. 05/01/2013 just  returned from surgery, positive pain, positive inability to void urine. 05/02/2013 states feeling better except for pain in foot which she describes as electric/scrapping. Would like  Foley removed. 05/03/2013 reveals improved, decreased swelling, negative warmth to touch. Patient resting comfortably in bed for surgeons to come and change wound VAC 05/04/2013 states negative pain, and multiple questions on exactly what hydrotherapy entailed. No other complaints. 05/05/2013 states that initially anxious about the hydrotherapy for her left foot but after completed felt well. 05/06/2013 patient resting comfortably in bed, states the foot pain is approximately 8/10 and has not had her pain medication this a.m. TODAY patient upset when I asked her about why she did not participate with PT yesterday. States yesterday she just did not feel up to participating but understands that she must participate in order to improve chance of maintaining left foot.     Objective: Filed Vitals:   05/08/13 0743 05/08/13 1100 05/08/13 1449 05/08/13 2130  BP: 101/71 150/85 123/74 142/74  Pulse: 69 89 86 80  Temp: 97.7 F (36.5 C) 98 F (36.7 C) 98.3 F (36.8 C) 98.5 F (36.9 C)  TempSrc: Oral Oral Oral Oral  Resp: 18 20 18 18   Height:      Weight:      SpO2: 96% 95% 96% 98%    Intake/Output Summary (Last 24 hours) at 05/08/13 2254 Last data filed at 05/08/13 1900  Gross per 24 hour  Intake 2301.67 ml  Output      0 ml  Net 2301.67 ml   Filed Weights   04/30/13 1856 04/30/13 2100  Weight: 163.295 kg (360 lb) 170.7 kg (376 lb 5.2 oz)    Exam:   General:  A./O. X4, NAD  Cardiovascular: Regular rhythm and rate, negative murmurs rubs gallops   Respiratory: Good auscultation  Abdomen: obese, nontender, plus bowel some  Musculoskeletal: Left foot wound VAC present over the remnants of fourth and fifth metatarsal. Negative warm to touch, swelling almost completely back to baseline     Data Reviewed: Basic Metabolic Panel:  Recent Labs Lab 05/04/13 0530 05/05/13 0550 05/06/13 0535 05/07/13 0425 05/08/13 0530  NA 131* 132* 132* 134* 134*  K 3.3* 3.4* 3.7 3.6 3.9  CL 98 102  100 99 102  CO2 22 22 21 22 22   GLUCOSE 262* 223* 203* 178* 199*  BUN 6 6 9 12 12   CREATININE 0.51 0.74 1.52* 1.66* 1.74*  CALCIUM 9.4 9.1 9.3 9.5 9.0  MG 1.4* 1.5 1.7 1.6 1.7   Liver Function Tests:  Recent Labs Lab 05/04/13 0530 05/05/13 0550 05/06/13 0535 05/07/13 0425 05/08/13 0530  AST 10 19 14 12 10   ALT 11 13 13 11 10   ALKPHOS 59 49 47 46 45  BILITOT 0.4 0.4 0.4 0.3 0.2*  PROT 6.7 6.4 6.7 6.6 6.9  ALBUMIN 2.4* 2.3* 2.4* 2.5* 2.6*   No results found for this basename: LIPASE, AMYLASE,  in the last 168 hours No results found for this basename: AMMONIA,  in the last 168 hours CBC:  Recent Labs Lab 05/04/13 0530 05/05/13 0550 05/06/13 0535 05/07/13 0425 05/08/13 0530  WBC 7.0 5.8 7.6 6.4 6.7  NEUTROABS 3.1 2.7 4.6 3.7 4.0  HGB 10.2* 10.2* 9.9* 9.7* 9.4*  HCT 30.4* 30.1* 29.4* 28.7* 28.7*  MCV 85.6 86.5 87.2 87.2 87.5  PLT 274 297 314 286 285   Cardiac Enzymes: No results found for this basename: CKTOTAL, CKMB, CKMBINDEX, TROPONINI,  in the last 168 hours BNP (last 3 results)  No results found for this basename: PROBNP,  in the last 8760 hours CBG:  Recent Labs Lab 05/08/13 0420 05/08/13 0741 05/08/13 1133 05/08/13 1755 05/08/13 1956  GLUCAP 181* 166* 191* 228* 224*    Recent Results (from the past 240 hour(s))  CULTURE, ROUTINE-ABSCESS     Status: None   Collection Time    04/30/13 11:00 PM      Result Value Range Status   Specimen Description ABSCESS LEFT FOOT   Final   Special Requests NONE   Final   Gram Stain     Final   Value: RARE WBC PRESENT, PREDOMINANTLY PMN     NO SQUAMOUS EPITHELIAL CELLS SEEN     NO ORGANISMS SEEN     Performed at [facility]   Culture     Final   Value: RARE GROUP B STREP(S.AGALACTIAE)ISOLATED     Note: TESTING AGAINST S. AGALACTIAE NOT ROUTINELY PERFORMED DUE TO PREDICTABILITY OF AMP/PEN/VAN SUSCEPTIBILITY.     Performed at [facility]   Report Status 05/03/2013 FINAL   Final  ANAEROBIC  CULTURE     Status: None   Collection Time    05/01/13  2:08 PM      Result Value Range Status   Specimen Description WOUND ABSCESS LEFT FOOT   Final   Special Requests PATIENT ON FOLLOWING VANCOMYCIN 1.5G ZOSYN 3.375G   Final   Gram Stain     Final   Value: RARE WBC PRESENT,BOTH PMN AND MONONUCLEAR     NO SQUAMOUS EPITHELIAL CELLS SEEN     RARE GRAM POSITIVE COCCI     IN PAIRS     Performed at [facility]   Culture     Final   Value: NO ANAEROBES ISOLATED     Performed at [facility]   Report Status 05/06/2013 FINAL   Final  WOUND CULTURE     Status: None   Collection Time    05/01/13  2:08 PM      Result Value Range Status   Specimen Description WOUND ABSCESS LEFT FOOT   Final   Special Requests PATIENT ON FOLLOWING VANCOMYCIN 1.5G ZOSYN 3.375G   Final   Gram Stain     Final   Value: RARE WBC PRESENT,BOTH PMN AND MONONUCLEAR     NO SQUAMOUS EPITHELIAL CELLS SEEN     RARE GRAM POSITIVE COCCI     IN PAIRS     Performed at [facility]   Culture     Final   Value: FEW GROUP B STREP(S.AGALACTIAE)ISOLATED     Note: TESTING AGAINST S. AGALACTIAE NOT ROUTINELY PERFORMED DUE TO PREDICTABILITY OF AMP/PEN/VAN SUSCEPTIBILITY.     Performed at [facility]   Report Status 05/03/2013 FINAL   Final  GRAM STAIN     Status: None   Collection Time    05/01/13  2:08 PM      Result Value Range Status   Specimen Description WOUND ABSCESS LEFT FOOT   Final   Special Requests PATIENT ON FOLLOWING VANCOMYCIN 1.5G ZOSYN 3.375G   Final   Gram Stain     Final   Value: NO ORGANISMS SEEN     NO WBC SEEN     Gram Stain Report Called to,Read Back By and Verified With: EDGALL,E. AT 1618 ON 10.09.14 BY LOVE,T.   Report Status 05/01/2013 FINAL   Final  TISSUE CULTURE     Status: None   Collection Time    05/01/13  2:14 PM      Result Value Range Status   Specimen Description FOOT LEFT METATARSAL   Final   Special Requests PATIENT ON FOLLOWING VANCOMYCIN 1.5G ZOSYN  3.375G   Final   Gram Stain     Final   Value: NO WBC SEEN     NO ORGANISMS SEEN     Performed at [facility]   Culture     Final   Value: FEW GROUP B STREP(S.AGALACTIAE)ISOLATED     Note: TESTING AGAINST S. AGALACTIAE NOT ROUTINELY PERFORMED DUE TO PREDICTABILITY OF AMP/PEN/VAN SUSCEPTIBILITY.     Performed at [facility]   Report Status 05/04/2013 FINAL   Final  ANAEROBIC CULTURE     Status: None   Collection Time    05/01/13  2:14 PM      Result Value Range Status   Specimen Description FOOT LEFT METATARSAL   Final   Special Requests PATIENT ON FOLLOWING VANCOMYCIN 1.5G ZOSYN 3.375G   Final   Gram Stain     Final   Value: NO WBC SEEN     NO ORGANISMS SEEN     Performed at [facility]   Culture     Final   Value: NO ANAEROBES ISOLATED     Performed at [facility]   Report Status 05/06/2013 FINAL   Final  GRAM STAIN     Status: None   Collection Time    05/01/13  2:14 PM      Result Value Range Status   Specimen Description FOOT LEFT METATARSAL   Final   Special Requests PATIENT ON FOLLOWING VANCOMYCIN 1.5G ZOSYN 3.375G   Final   Gram Stain     Final   Value: NO WBC SEEN     FEW GRAM POSITIVE COCCI     Gram Stain Report Called to,Read Back By and Verified With: EDGAL,E. AT 1617 ON 10.09.14 BY LOVE,T.   Report Status 05/01/2013 FINAL   Final  MRSA PCR SCREENING     Status: None   Collection Time    05/04/13  8:18 AM      Result Value Range Status   MRSA by PCR NEGATIVE  NEGATIVE Final   Comment:            The GeneXpert MRSA Assay (FDA     approved for NASAL specimens     only), is one component of a     comprehensive MRSA colonization     surveillance program. It is not     intended to diagnose MRSA     infection nor to guide or     monitor treatment for     MRSA infections.     Studies: No results found.  Scheduled Meds: . atorvastatin  40 mg Oral q1800  . cefTRIAXone (ROCEPHIN)  IV  2 g Intravenous Q24H  . enoxaparin  (LOVENOX) injection  85 mg Subcutaneous Q24H  . ferrous sulfate  325 mg Oral TID PC  . gabapentin  300 mg Oral BID  . insulin aspart  0-20 Units Subcutaneous Q4H  . insulin glargine  30 Units Subcutaneous Daily  . magnesium sulfate 1 - 4 g bolus IVPB  2 g Intravenous Once  . metroNIDAZOLE  500 mg Oral Q8H   Continuous Infusions: . sodium chloride 100 mL/hr at 05/08/13 2238    Principal Problem:   Diabetic foot ulcer Active Problems:   DM (diabetes mellitus), type 2, uncontrolled with complications   Obesity, Class III,  BMI 40-49.9 (morbid obesity)   Hyponatremia   Osteomyelitis   Cellulitis and abscess of leg, except foot   Diabetic peripheral neuropathy associated with type 2 diabetes mellitus   Hypokalemia   HLD (hyperlipidemia)   Hypomagnesemia   Phantom limb pain   Acute renal insufficiency    Time spent: 35 minutes    Dann Galicia, J  [facility] Pager [phone]. If 7PM-7AM, please contact night-coverage at [email or web address], password [facility] 05/08/2013, 10:54 PM  LOS: 8 days

## 2013-05-08 NOTE — Consult Note (Signed)
WOC wound consult note Patient seen in followup; NPWT dressing applied following hydrotherapy treatment and debridement.  I understand that Dr. Lajoyce Corners will see later today and that he may revise POC after examination.   Bedside nursing staff are able to assist with NPWT dressings and I will follow at intervals.  I will not be here tomorrow, but will check on her over the weekend. WOC nursing team will not follow daily, but will remain available to this patient, the nursing and medical team.  Please re-consult if needed. Thanks, Ladona Mow, MSN, RN, GNP, [place], CWON-AP [phone])    .

## 2013-05-08 NOTE — Progress Notes (Signed)
Inpatient Diabetes Program Recommendations  AACE/ADA: New Consensus Statement on Inpatient Glycemic Control (2013)  Target Ranges:  Prepandial:   less than 140 mg/dL      Peak postprandial:   less than 180 mg/dL (1-2 hours)      Critically ill patients:  140 - 180 mg/dL   Reason for Visit: Hyperglycemia Results for Karen Bennett, Karen Bennett (MRN [id number]) as of 05/08/2013 15:12  Ref. Range 05/07/2013 16:22 05/07/2013 19:49 05/08/2013 00:07 05/08/2013 04:20 05/08/2013 07:41 05/08/2013 11:33  Glucose-Capillary Latest Range: 70-99 mg/dL 409 (H) 811 (H) 914 (H) 181 (H) 166 (H) 191 (H)   Blood sugars have improved, but still slightly high for optimal healing.  Recommendations: Change Novolog resistant to tidwc and hs Add meal coverage insulin - Novolog 3 units tidwc.  Will follow daily with glycemic control recommendations.  Thank you. Ailene Ards, RD, LDN, CDE Inpatient Diabetes Coordinator [phone]

## 2013-05-09 LAB — CBC WITH DIFFERENTIAL/PLATELET
Basophils Absolute: 0 10*3/uL (ref 0.0–0.1)
Basophils Relative: 0 % (ref 0–1)
Eosinophils Relative: 2 % (ref 0–5)
Lymphocytes Relative: 24 % (ref 12–46)
MCH: 29.3 pg (ref 26.0–34.0)
Neutro Abs: 4.5 10*3/uL (ref 1.7–7.7)
Platelets: 335 10*3/uL (ref 150–400)
RBC: 3.38 MIL/uL — ABNORMAL LOW (ref 3.87–5.11)
RDW: 12.9 % (ref 11.5–15.5)
WBC: 6.9 10*3/uL (ref 4.0–10.5)

## 2013-05-09 LAB — GLUCOSE, CAPILLARY
Glucose-Capillary: 158 mg/dL — ABNORMAL HIGH (ref 70–99)
Glucose-Capillary: 176 mg/dL — ABNORMAL HIGH (ref 70–99)
Glucose-Capillary: 190 mg/dL — ABNORMAL HIGH (ref 70–99)
Glucose-Capillary: 191 mg/dL — ABNORMAL HIGH (ref 70–99)
Glucose-Capillary: 195 mg/dL — ABNORMAL HIGH (ref 70–99)
Glucose-Capillary: 220 mg/dL — ABNORMAL HIGH (ref 70–99)

## 2013-05-09 LAB — COMPREHENSIVE METABOLIC PANEL
Alkaline Phosphatase: 54 U/L (ref 39–117)
BUN: 11 mg/dL (ref 6–23)
CO2: 22 mEq/L (ref 19–32)
Chloride: 101 mEq/L (ref 96–112)
GFR calc Af Amer: 43 mL/min — ABNORMAL LOW (ref >90)
GFR calc non Af Amer: 37 mL/min — ABNORMAL LOW (ref >90)
Glucose, Bld: 197 mg/dL — ABNORMAL HIGH (ref 70–99)
Potassium: 4.1 mEq/L (ref 3.5–5.1)
Total Bilirubin: 0.3 mg/dL (ref 0.3–1.2)

## 2013-05-09 LAB — MAGNESIUM: Magnesium: 1.6 mg/dL (ref 1.5–2.5)

## 2013-05-09 MED ORDER — FLUCONAZOLE 150 MG PO TABS
150.0000 mg | ORAL_TABLET | Freq: Once | ORAL | Status: AC
  Administered 2013-05-10: 150 mg via ORAL
  Filled 2013-05-09: qty 1

## 2013-05-09 NOTE — Progress Notes (Signed)
Patient ID: Jacqualine Code, female   DOB: 05-15-1976, 37 y.o.   MRN: [id number] I spoke earlier today with Ms. Tocci and received a phone number for Ms. Gorden Harms at the [employer] her application For [employer].[phone] ext [phone] I left a message on her answering machine to page me so I can discuss what steps I can tke to support Ms. Boudoin's application for Medicaid.

## 2013-05-09 NOTE — Progress Notes (Signed)
PT HYDROTHERAPY NOTE    05/09/13 1459  Subjective Assessment  Subjective I was really sad last night, but feel I got beter news this morning from the MD.   Date of Onset 04/21/13  Evaluation and Treatment  Evaluation and Treatment Procedures Explained to Patient/Family Yes  Evaluation and Treatment Procedures agreed to  Wound 05/05/13 Diabetic ulcer Foot Left L lateral foot I & D   Date First Assessed/Time First Assessed: 05/05/13 1030   Wound Type: Diabetic ulcer  Location: Foot  Location Orientation: Left  Wound Description (Comments): L lateral foot I & D   Present on Admission: Yes  Site / Wound Assessment Red;Granulation tissue;Yellow (still 2x2 area on periwound around 11:00)  % Wound base Red or Granulating 90%  % Wound base Yellow 10%  % Wound base Black 0%  Peri-wound Assessment Maceration;Edema  Margins Unattacted edges (unapproximated)  Closure None  Drainage Amount Minimal (in VAC)  Drainage Description Serosanguineous  Non-staged Wound Description Full thickness  Treatment Debridement (Selective);Hydrotherapy (Pulse lavage) (VAC placed after bvy nursing)  Dressing Type (VAC placed after by nursing)  Dressing Changed New  Dressing Status Dry;Clean;Intact  Hydrotherapy  Pulsed Lavage with Suction (psi) 8 psi (varied 8-12)  Pulsed Lavage with Suction - Normal Saline Used 1000 mL  Pulsed Lavage Tip Tip with splash shield  Pulsed lavage therapy - wound location L lateral foot  Selective Debridement  Selective Debridement - Location L Lateral foot 9:00-12:00 location  Selective Debridement - Tools Used Forceps;Scissors  Selective Debridement - Tissue Removed yellow necrotic tissue  Wound Therapy - Assess/Plan/Recommendations  Wound Therapy - Clinical Statement Continues to look better. Finger like flap at 9:00 was gentlly growing into underlying area, so gentle pulled loss to debride under to make sure not creating necrotic hidden area. Seeing progressing with decreased  necrotic area and increased granulation, feel may be able to leave VAC in place for longer periods of time and soon eliminate hydro therpay once necrotic areas minimal. Will assess tomorrow Raelyn Mora)  and then back on Monday. Hoping to leave VAC in place for Sunday if therapists and WOC agree on Saturday.   Wound Therapy - Functional Problem List Chronic diabetic wound  Factors Delaying/Impairing Wound Healing Diabetes Mellitus  Hydrotherapy Plan Debridement;Patient/family education;Pulsatile lavage with suction  Wound Therapy - Frequency 6X / week  Wound Therapy - Current Recommendations Case manager/social work;Diabetic teaching;Nutritionist  Wound Plan to continue to provide pulsed lavage with debridement once less than 5% , may need to decrease frequency in order to not be disruptive to granualating tissue in VAC placment and decrease to 3x /week or none once wound bed imporves.   Wound Therapy Goals - Improve the function of patient's integumentary system by progressing the wound(s) through the phases of wound healing by:  Decrease Necrotic Tissue to less than 5%  Decrease Necrotic Tissue - Progress Progressing toward goal  Increase Granulation Tissue to 95% or greater  Increase Granulation Tissue - Progress Progressing toward goal  Marella Bile, PT Pager: [phone] 05/09/2013

## 2013-05-09 NOTE — Progress Notes (Signed)
[facility] PROGRESS NOTE  Karen Bennett AOZ:[id number] DOB: [birth year] DOA: 04/30/2013 PCP: No primary provider on file.  Assessment/Plan: Osteomyelitis fourth/fifth left metatarsal; wound care per orthopedic surgery - Continue start antibiotics per ID recommendations; ceftriaxone+ metronidazole for 6 weeks, PICC line placed.  - Continue rounds of bedside hydrotherapy to left foot per surgery - placement to SNF pending.  IDDM - increased SSI to Resistant and increase  Lantus 40 unit QD, -Hemoglobin A1c 04/30/2013= 13 Hyponatremia; continue to monitor still slightly low but stable.  -Normal saline has been stopped this point secondary to patient increasing consumption of fluid/food Hypokalemia; within normal limits HLD; -Lipid panel; not within ADA guidelines (HDL low, TG= 520), started on Lipitor 40 mg; will also  probably require niacin Phantom Pain; continue Neurontin to BID Hypomagnesemia; within normal limits continue to monitor  Acute renal insufficiency; patient's creatinine is trending up, now stable. Likely multifactorial, antibiotics, infection in the setting of background with poorly controlled DM. Monitor.    Code Status:  Full Family Communication: Disposition Plan: SNF once placement available.   Consultants:  Orthopedic surgery  Procedures: 05/01/2013  INCISION AND DRAINAGE LEFT FOREFOOT ABCESS (Left) by Dr Kerrin Champagne (orthopedics)  Gram stain shows few gram-positive cocci 05/03/2013 Wound cultures; RARE GROUP B STREP(S.AGALACTIAE)ISOLATED (final 05/03/2013)  Antibiotics:  Zosyn 10/9>> stopped 10/14   Vancomycin 10/9>> stopped 10/14  Ceftriaxone 10/14>> stop date 11/18  Metronidazole 10/14 stop date 11/18  HPI/Subjective: Karen Bennett is a 37 y.o. 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 In 10/2011 Dr. Doneen Poisson performed a  repeat I and D of the Left foot. She has been extremely noncompliant with any followup with orthopaedics and Dr. Annell Greening was on Call at the last ER presentation for her left foot in 08/2012 and preformed an I and D of a left foot lateral abscess. There are no office notes post op and it appears as though she did not return for follow up with Dr. Ophelia Charter after that  Procedure. There are no records of Dr. Ophelia Charter being contacted about increasing problems with the left foot this Week. Karen Bennett indicates that she called Dr. Ophelia Charter and a prescription for Keflex was called in for her. She presents With a two week history of increasing left foot swelling erythrema and pain. Had noticed intermittant chills, no fever. Seen in [facility] with normal WBC But definite left foot swelling erythrema and warmth consistent with a left Foot lateral abscess. Admitting MD describing as a grapefruit size swelling of the left lateral foot. xrays are concerning  For sclerotic changes of the distal resected ends of the 4th and 5th metatarsal amputation ends. 05/01/2013 just returned from surgery, positive pain, positive inability to void urine. 05/02/2013 states feeling better except for pain in foot which she describes as electric/scrapping. Would like Foley removed. 05/03/2013 reveals improved, decreased swelling, negative warmth to touch. Patient resting comfortably in bed for surgeons to come and change wound VAC 05/04/2013 states negative pain, and multiple questions on exactly what hydrotherapy entailed. No other complaints. 05/05/2013 states that initially anxious about the hydrotherapy for her left foot but after completed felt well. 05/06/2013 patient resting comfortably in bed, states the foot pain is approximately 8/10 and has not had her pain medication this a.m. 10/16 patient upset when I asked her about why she did not participate with PT yesterday. States yesterday she just did not feel up  to participating but understands that she must  participate in order to improve chance of maintaining left foot.10/17 she feels well and has no complaints. She is hopeful that she can avoid amputation.    Objective: Filed Vitals:   05/08/13 1449 05/08/13 2130 05/09/13 0504 05/09/13 1429  BP: 123/74 142/74 149/79 155/84  Pulse: 86 80 76 75  Temp: 98.3 F (36.8 C) 98.5 F (36.9 C) 98.7 F (37.1 C) 98.2 F (36.8 C)  TempSrc: Oral Oral Oral Oral  Resp: 18 18 18 20   Height:      Weight:      SpO2: 96% 98% 99% 96%    Intake/Output Summary (Last 24 hours) at 05/09/13 1623 Last data filed at 05/09/13 1223  Gross per 24 hour  Intake   1560 ml  Output      0 ml  Net   1560 ml   Filed Weights   04/30/13 1856 04/30/13 2100  Weight: 163.295 kg (360 lb) 170.7 kg (376 lb 5.2 oz)    Exam:   General:  A./O. X4, NAD  Cardiovascular: Regular rhythm and rate, negative murmurs rubs gallops   Respiratory: Good auscultation  Abdomen: obese, nontender, plus bowel some  Musculoskeletal: Left foot wound VAC present over the remnants of fourth and fifth metatarsal. Negative warm to touch, swelling almost completely back to baseline     Data Reviewed: Basic Metabolic Panel:  Recent Labs Lab 05/05/13 0550 05/06/13 0535 05/07/13 0425 05/08/13 0530 05/09/13 0435  NA 132* 132* 134* 134* 133*  K 3.4* 3.7 3.6 3.9 4.1  CL 102 100 99 102 101  CO2 22 21 22 22 22   GLUCOSE 223* 203* 178* 199* 197*  BUN 6 9 12 12 11   CREATININE 0.74 1.52* 1.66* 1.74* 1.72*  CALCIUM 9.1 9.3 9.5 9.0 9.1  MG 1.5 1.7 1.6 1.7 1.6   Liver Function Tests:  Recent Labs Lab 05/05/13 0550 05/06/13 0535 05/07/13 0425 05/08/13 0530 05/09/13 0435  AST 19 14 12 10 11   ALT 13 13 11 10 10   ALKPHOS 49 47 46 45 54  BILITOT 0.4 0.4 0.3 0.2* 0.3  PROT 6.4 6.7 6.6 6.9 7.2  ALBUMIN 2.3* 2.4* 2.5* 2.6* 2.8*   CBC:  Recent Labs Lab 05/05/13 0550 05/06/13 0535 05/07/13 0425 05/08/13 0530 05/09/13 0435  WBC 5.8 7.6 6.4 6.7 6.9  NEUTROABS 2.7 4.6 3.7 4.0  4.5  HGB 10.2* 9.9* 9.7* 9.4* 9.9*  HCT 30.1* 29.4* 28.7* 28.7* 29.6*  MCV 86.5 87.2 87.2 87.5 87.6  PLT 297 314 286 285 335   CBG:  Recent Labs Lab 05/08/13 1956 05/09/13 0025 05/09/13 0357 05/09/13 0816 05/09/13 1213  GLUCAP 224* 176* 158* 190* 191*    Recent Results (from the past 240 hour(s))  CULTURE, ROUTINE-ABSCESS     Status: None   Collection Time    04/30/13 11:00 PM      Result Value Range Status   Specimen Description ABSCESS LEFT FOOT   Final   Special Requests NONE   Final   Gram Stain     Final   Value: RARE WBC PRESENT, PREDOMINANTLY PMN     NO SQUAMOUS EPITHELIAL CELLS SEEN     NO ORGANISMS SEEN     Performed at [facility]   Culture     Final   Value: RARE GROUP B STREP(S.AGALACTIAE)ISOLATED     Note: TESTING AGAINST S. AGALACTIAE NOT ROUTINELY PERFORMED DUE TO PREDICTABILITY OF AMP/PEN/VAN SUSCEPTIBILITY.     Performed at  [facility]   Report Status 05/03/2013 FINAL   Final  ANAEROBIC CULTURE     Status: None   Collection Time    05/01/13  2:08 PM      Result Value Range Status   Specimen Description WOUND ABSCESS LEFT FOOT   Final   Special Requests PATIENT ON FOLLOWING VANCOMYCIN 1.5G ZOSYN 3.375G   Final   Gram Stain     Final   Value: RARE WBC PRESENT,BOTH PMN AND MONONUCLEAR     NO SQUAMOUS EPITHELIAL CELLS SEEN     RARE GRAM POSITIVE COCCI     IN PAIRS     Performed at [facility]   Culture     Final   Value: NO ANAEROBES ISOLATED     Performed at [facility]   Report Status 05/06/2013 FINAL   Final  WOUND CULTURE     Status: None   Collection Time    05/01/13  2:08 PM      Result Value Range Status   Specimen Description WOUND ABSCESS LEFT FOOT   Final   Special Requests PATIENT ON FOLLOWING VANCOMYCIN 1.5G ZOSYN 3.375G   Final   Gram Stain     Final   Value: RARE WBC PRESENT,BOTH PMN AND MONONUCLEAR     NO SQUAMOUS EPITHELIAL CELLS SEEN     RARE GRAM POSITIVE COCCI     IN PAIRS     Performed  at [facility]   Culture     Final   Value: FEW GROUP B STREP(S.AGALACTIAE)ISOLATED     Note: TESTING AGAINST S. AGALACTIAE NOT ROUTINELY PERFORMED DUE TO PREDICTABILITY OF AMP/PEN/VAN SUSCEPTIBILITY.     Performed at [facility]   Report Status 05/03/2013 FINAL   Final  GRAM STAIN     Status: None   Collection Time    05/01/13  2:08 PM      Result Value Range Status   Specimen Description WOUND ABSCESS LEFT FOOT   Final   Special Requests PATIENT ON FOLLOWING VANCOMYCIN 1.5G ZOSYN 3.375G   Final   Gram Stain     Final   Value: NO ORGANISMS SEEN     NO WBC SEEN     Gram Stain Report Called to,Read Back By and Verified With: EDGALL,E. AT 1618 ON 10.09.14 BY LOVE,T.   Report Status 05/01/2013 FINAL   Final  TISSUE CULTURE     Status: None   Collection Time    05/01/13  2:14 PM      Result Value Range Status   Specimen Description FOOT LEFT METATARSAL   Final   Special Requests PATIENT ON FOLLOWING VANCOMYCIN 1.5G ZOSYN 3.375G   Final   Gram Stain     Final   Value: NO WBC SEEN     NO ORGANISMS SEEN     Performed at [facility]   Culture     Final   Value: FEW GROUP B STREP(S.AGALACTIAE)ISOLATED     Note: TESTING AGAINST S. AGALACTIAE NOT ROUTINELY PERFORMED DUE TO PREDICTABILITY OF AMP/PEN/VAN SUSCEPTIBILITY.     Performed at [facility]   Report Status 05/04/2013 FINAL   Final  ANAEROBIC CULTURE     Status: None   Collection Time    05/01/13  2:14 PM      Result Value Range Status   Specimen Description FOOT LEFT METATARSAL   Final   Special Requests PATIENT ON FOLLOWING VANCOMYCIN 1.5G ZOSYN 3.375G   Final  Gram Stain     Final   Value: NO WBC SEEN     NO ORGANISMS SEEN     Performed at [facility]   Culture     Final   Value: NO ANAEROBES ISOLATED     Performed at [facility]   Report Status 05/06/2013 FINAL   Final  GRAM STAIN     Status: None   Collection Time    05/01/13  2:14 PM      Result Value Range Status    Specimen Description FOOT LEFT METATARSAL   Final   Special Requests PATIENT ON FOLLOWING VANCOMYCIN 1.5G ZOSYN 3.375G   Final   Gram Stain     Final   Value: NO WBC SEEN     FEW GRAM POSITIVE COCCI     Gram Stain Report Called to,Read Back By and Verified With: EDGAL,E. AT 1617 ON 10.09.14 BY LOVE,T.   Report Status 05/01/2013 FINAL   Final  MRSA PCR SCREENING     Status: None   Collection Time    05/04/13  8:18 AM      Result Value Range Status   MRSA by PCR NEGATIVE  NEGATIVE Final   Comment:            The GeneXpert MRSA Assay (FDA     approved for NASAL specimens     only), is one component of a     comprehensive MRSA colonization     surveillance program. It is not     intended to diagnose MRSA     infection nor to guide or     monitor treatment for     MRSA infections.     Studies: No results found.  Scheduled Meds: . atorvastatin  40 mg Oral q1800  . cefTRIAXone (ROCEPHIN)  IV  2 g Intravenous Q24H  . enoxaparin (LOVENOX) injection  85 mg Subcutaneous Q24H  . ferrous sulfate  325 mg Oral TID PC  . gabapentin  300 mg Oral BID  . insulin aspart  0-20 Units Subcutaneous Q4H  . insulin glargine  40 Units Subcutaneous Daily  . magnesium sulfate 1 - 4 g bolus IVPB  2 g Intravenous Once  . metroNIDAZOLE  500 mg Oral Q8H   Continuous Infusions: . sodium chloride 100 mL/hr at 05/09/13 1446   Principal Problem:   Diabetic foot ulcer Active Problems:   DM (diabetes mellitus), type 2, uncontrolled with complications   Obesity, Class III, BMI 40-49.9 (morbid obesity)   Hyponatremia   Osteomyelitis   Cellulitis and abscess of leg, except foot   Diabetic peripheral neuropathy associated with type 2 diabetes mellitus   Hypokalemia   HLD (hyperlipidemia)   Hypomagnesemia   Phantom limb pain   Acute renal insufficiency  Time spent: 35 minutes  Pamella Pert  [facility] Pager [phone]. If 7PM-7AM, please contact night-coverage at [email or web address], password  [facility] 05/09/2013, 4:23 PM  LOS: 9 days

## 2013-05-09 NOTE — Progress Notes (Addendum)
Ms Shartzer was in a high state of stress and anxiety when chaplain visited. She is highly stressed about the condition of her foot and the possibility of having it amputated. She also has other stressors which were extremely high. The stress was so high that she was having what appeared to be panic attacks. Her other chief stressors are:  1 Not having medical insurance and being turned down for Medicaid. 2. Having no insurance means no medications for her diabetes, including no meter to track her sugar levels 3 Having no income and having to depend on the charity of [employer] in [place] 4 The death of her last living relative, her mother, in the last year.  5. Being in the hospital or care facility when her 37th birthday comes on Oct [birth year].  She is alone and afraid. Spiritually she feels abandoned and on her own. She says she would attend a church and build a community of faith but she has no transportation. Thus sense of being cut off and alone.  Chaplain and nurse spoke about the above. Chaplain recommended that a chaplain be paged if Ms Weide's stress dramatically increases again. Her stress is increasing her pain in her physical, mental and spiritual being. To relieve her spiritual and mental pain it is required for someone with proper knowledge and authority to assist her find out why she is being denied medical insurance and/or Medicaid. Further, it is suggested that the Social Workers work with charities to see about buying a reliable diabetes meter that uses inexpensive test strips. While she is in the hospital or medical facilities these items are not necessary. Chaplains should continue in care for Ms Denslow as she works through her grief over the loss of her mother.   Meditation exercises to relieve stress seemed to have been beneficial. Prayer based on her requests was spoken. The beginning session of grief counsel was given.  Follow up chaplain care indicated and  recommended.  Benjie Karvonen. Nary Sneed, DMin, MDiv Chaplain

## 2013-05-09 NOTE — Progress Notes (Signed)
Clinical Social Work  Patient was discussed during progression meeting and it was reported that patient will remain on IV antibiotics with hydrotherapy and wound vac changes. CSW has faxed out patient to several facilities with no bed offers.  CSW discussed case with Chiropodist Timothy Lasso Greene) who suggested CSW [facility] of [address] and [employer] and [employer]. CSW had to leave a message with [facility] and [facility] reports they cannot provide hydrotherapy. [facility] is reviewing patient's information and will contact CSW once they have decided if they can accept patient.  [place], [place] [phone]

## 2013-05-09 NOTE — Progress Notes (Signed)
Patient ID: Karen Bennett, female   DOB: [birth year], 37 y.o.   MRN: [id number] Subjective: 8 Days Post-Op Procedure(s) (LRB): INCISION AND DRAINAGE LEFT FOREFOOT ABCESS  (Left) APPLICATION OF WOUND VAC (Left) awake alert and oriented x4. Dr. Aldean Baker saw this patient yesterday evening and noted large open wound left lateral foot with resection of the base of the fifth metatarsal he is concerned that the patient will lose the ability to evert the left foot. Patient however has been able to ambulate with a Darco shoe to and from the bathroom heel walking on the left foot. This is foot shows warm pink appearance. Patient at this time would prefer to continue conservative management of her left foot rather then proceeding with transtibial amputation. Any foot procedure at this point that removes more bone or tissue will likely require that below knee amputation be done.   Patient reports pain as mild.    Objective:   VITALS:  Temp:  [98 F (36.7 C)-98.7 F (37.1 C)] 98.7 F (37.1 C) (10/17 0504) Pulse Rate:  [76-89] 76 (10/17 0504) Resp:  [18-20] 18 (10/17 0504) BP: (123-150)/(74-85) 149/79 mmHg (10/17 0504) SpO2:  [95 %-99 %] 99 % (10/17 0504)  Neurologically intact ABD soft VAC dressing was removed this morning the left lateral foot demonstrates granulation over 90% with decreased drainage. Small open wound 2 x 2 cm extends to the left lateral fourth metatarsal tarsal joint.  LABS  Recent Labs  05/07/13 0425 05/08/13 0530 05/09/13 0435  HGB 9.7* 9.4* 9.9*  WBC 6.4 6.7 6.9  PLT 286 285 335    Recent Labs  05/08/13 0530 05/09/13 0435  NA 134* 133*  K 3.9 4.1  CL 102 101  CO2 22 22  BUN 12 11  CREATININE 1.74* 1.72*  GLUCOSE 199* 197*   CRP performed 05/08/2013 is 3.0 down from 33. No results found for this basename: LABPT, INR,  in the last 72 hours   Assessment/Plan: 8 Days Post-Op Procedure(s) (LRB): INCISION AND DRAINAGE LEFT FOREFOOT ABCESS   (Left) APPLICATION OF WOUND VAC (Left) Improved CRP compared with her immediate hospitalization admission value suggests some overall improvement in left foot condition.  Plan: In discussion with Karen Bennett, its apparent that the probability of success in treating her left foot is not great. Yet, she has shown improvement with local debridement of osteomyelitic changes of the fifth metatarsal base and drainage of the tarsal metatarsal joints. Further incision and debridement will likely lead to further bone resection and the need to proceed with below-knee amputation. Presently her blood glucose is improving, the clinical exam of her foot shows granulation and decreased drainage. I told Karen Bennett that the decision is clear either consider below-knee amputation or continue with IV antibiotics for 6 weeks and assess as an outpatient with MRI scan. I think she probably can fit in a open MRI weight limit 500 pounds. That being the case we'll continue with dressing changes and VAC care and IV antibiotic treatment. We'll need  weekly monitoring of her situation and should she deteriorate a below knee amputation would be necessary. She understands the risk of chronic infection, the risk that infection can progress to other areas. There is risk that she could even passed away due to infection however her laboratory tests regarding her infection do show moderate improvement compared with last weeks tests. Her renal function we'll need to be carefully monitored and I suspect her renal dysfunction it is a combination of aminoglycoside antibiotics, diabetes  and IV contrast used for contrast CT scan of her foot. I would recommend no further contrast with tests in the future. We'll need to monitor her CRP and sedimentation rate has her clinical course continues. When her foot shows granulation and coverage of the open bone and joint areas and then consider return to the operating room for a split-thickness skin graft if  clinical measures including sedimentation rate CRP and followup MRI scan demonstrates improved status of the left foot so that coverage could be undertaken. She is stable orthopedically will continue to follow while in the hospital and post hospital.When medically indicated and stable transfer to SNF.  Isidro Monks E 05/09/2013, 8:13 AM

## 2013-05-09 NOTE — Progress Notes (Signed)
[facility]  Day #4 ceftriaxone Day #4  flagyl  Subjective: No new complaints   Antibiotics:  Anti-infectives   Start     Dose/Rate Route Frequency Ordered Stop   05/06/13 1500  metroNIDAZOLE (FLAGYL) tablet 500 mg     500 mg Oral 3 times per day 05/06/13 1417     05/06/13 1430  cefTRIAXone (ROCEPHIN) 2 g in dextrose 5 % 50 mL IVPB     2 g 2 mL/hr over 1,500 Minutes Intravenous Every 24 hours 05/06/13 1417     05/03/13 0400  vancomycin (VANCOCIN) 1,250 mg in sodium chloride 0.9 % 250 mL IVPB  Status:  Discontinued     1,250 mg 166.7 mL/hr over 90 Minutes Intravenous Every 8 hours 05/02/13 2052 05/06/13 1417   05/01/13 1434  polymyxin B 500,000 Units, bacitracin 50,000 Units in sodium chloride irrigation 0.9 % 500 mL irrigation  Status:  Discontinued       As needed 05/01/13 1434 05/01/13 1501   05/01/13 0800  vancomycin (VANCOCIN) 1,500 mg in sodium chloride 0.9 % 500 mL IVPB  Status:  Discontinued     1,500 mg 250 mL/hr over 120 Minutes Intravenous Every 12 hours 04/30/13 1956 05/02/13 2052   05/01/13 0200  piperacillin-tazobactam (ZOSYN) IVPB 3.375 g  Status:  Discontinued     3.375 g 12.5 mL/hr over 240 Minutes Intravenous Every 8 hours 04/30/13 2215 05/06/13 1417   04/30/13 2000  vancomycin (VANCOCIN) 2,500 mg in sodium chloride 0.9 % 500 mL IVPB     2,500 mg 250 mL/hr over 120 Minutes Intravenous  Once 04/30/13 1909 04/30/13 2209   04/30/13 1915  piperacillin-tazobactam (ZOSYN) IVPB 3.375 g     3.375 g 100 mL/hr over 30 Minutes Intravenous  Once 04/30/13 1915 04/30/13 2007   04/30/13 1845  piperacillin-tazobactam (ZOSYN) IVPB 4.5 g  Status:  Discontinued     4.5 g 200 mL/hr over 30 Minutes Intravenous  Once 04/30/13 1842 04/30/13 1914      Medications: Scheduled Meds: . atorvastatin  40 mg Oral q1800  . cefTRIAXone (ROCEPHIN)  IV  2 g Intravenous Q24H  . enoxaparin (LOVENOX) injection  85 mg Subcutaneous Q24H  . ferrous sulfate  325 mg  Oral TID PC  . gabapentin  300 mg Oral BID  . insulin aspart  0-20 Units Subcutaneous Q4H  . insulin glargine  40 Units Subcutaneous Daily  . magnesium sulfate 1 - 4 g bolus IVPB  2 g Intravenous Once  . metroNIDAZOLE  500 mg Oral Q8H   Continuous Infusions: . sodium chloride 100 mL/hr at 05/09/13 0531   PRN Meds:.HYDROmorphone (DILAUDID) injection, LORazepam, metoCLOPramide (REGLAN) injection, metoCLOPramide, oxyCODONE-acetaminophen, phenol, promethazine, sodium chloride, zolpidem   Objective: Weight change:   Intake/Output Summary (Last 24 hours) at 05/09/13 1015 Last data filed at 05/09/13 0412  Gross per 24 hour  Intake    720 ml  Output      0 ml  Net    720 ml   Blood pressure 149/79, pulse 76, temperature 98.7 F (37.1 C), temperature source Oral, resp. rate 18, height 5' 8.11" (1.73 m), weight 376 lb 5.2 oz (170.7 kg), last menstrual period 04/13/2013, SpO2 99.00%. Temp:  [98 F (36.7 C)-98.7 F (37.1 C)] 98.7 F (37.1 C) (10/17 0504) Pulse Rate:  [76-89] 76 (10/17 0504) Resp:  [18-20] 18 (10/17 0504) BP: (123-150)/(74-85) 149/79 mmHg (10/17 0504) SpO2:  [95 %-99 %] 99 % (10/17 0504)  Physical Exam: General: Alert  and awake, oriented x3, not in any acute distress, obese  HEENT: anicteric sclera,  EOMI, oropharynx clear and without exudate  CVS regular rate, normal r, no murmur rubs or gallops  Chest: clear to auscultation bilaterally, no wheezing, rales or rhonchi  Abdomen: soft nontender, nondistended, normal bowel sounds,  Extremities: left foot with dressing  Neuro: nonfocal,    Lab Results:  Recent Labs  05/08/13 0530 05/09/13 0435  WBC 6.7 6.9  HGB 9.4* 9.9*  HCT 28.7* 29.6*  PLT 285 335    BMET  Recent Labs  05/08/13 0530 05/09/13 0435  NA 134* 133*  K 3.9 4.1  CL 102 101  CO2 22 22  GLUCOSE 199* 197*  BUN 12 11  CREATININE 1.74* 1.72*  CALCIUM 9.0 9.1    Micro Results: Recent Results (from the past 240 hour(s))  CULTURE,  ROUTINE-ABSCESS     Status: None   Collection Time    04/30/13 11:00 PM      Result Value Range Status   Specimen Description ABSCESS LEFT FOOT   Final   Special Requests NONE   Final   Gram Stain     Final   Value: RARE WBC PRESENT, PREDOMINANTLY PMN     NO SQUAMOUS EPITHELIAL CELLS SEEN     NO ORGANISMS SEEN     Performed at [facility]   Culture     Final   Value: RARE GROUP B STREP(S.AGALACTIAE)ISOLATED     Note: TESTING AGAINST S. AGALACTIAE NOT ROUTINELY PERFORMED DUE TO PREDICTABILITY OF AMP/PEN/VAN SUSCEPTIBILITY.     Performed at [facility]   Report Status 05/03/2013 FINAL   Final  ANAEROBIC CULTURE     Status: None   Collection Time    05/01/13  2:08 PM      Result Value Range Status   Specimen Description WOUND ABSCESS LEFT FOOT   Final   Special Requests PATIENT ON FOLLOWING VANCOMYCIN 1.5G ZOSYN 3.375G   Final   Gram Stain     Final   Value: RARE WBC PRESENT,BOTH PMN AND MONONUCLEAR     NO SQUAMOUS EPITHELIAL CELLS SEEN     RARE GRAM POSITIVE COCCI     IN PAIRS     Performed at [facility]   Culture     Final   Value: NO ANAEROBES ISOLATED     Performed at [facility]   Report Status 05/06/2013 FINAL   Final  WOUND CULTURE     Status: None   Collection Time    05/01/13  2:08 PM      Result Value Range Status   Specimen Description WOUND ABSCESS LEFT FOOT   Final   Special Requests PATIENT ON FOLLOWING VANCOMYCIN 1.5G ZOSYN 3.375G   Final   Gram Stain     Final   Value: RARE WBC PRESENT,BOTH PMN AND MONONUCLEAR     NO SQUAMOUS EPITHELIAL CELLS SEEN     RARE GRAM POSITIVE COCCI     IN PAIRS     Performed at [facility]   Culture     Final   Value: FEW GROUP B STREP(S.AGALACTIAE)ISOLATED     Note: TESTING AGAINST S. AGALACTIAE NOT ROUTINELY PERFORMED DUE TO PREDICTABILITY OF AMP/PEN/VAN SUSCEPTIBILITY.     Performed at [facility]   Report Status 05/03/2013 FINAL   Final  GRAM STAIN     Status: None    Collection Time    05/01/13  2:08 PM  Result Value Range Status   Specimen Description WOUND ABSCESS LEFT FOOT   Final   Special Requests PATIENT ON FOLLOWING VANCOMYCIN 1.5G ZOSYN 3.375G   Final   Gram Stain     Final   Value: NO ORGANISMS SEEN     NO WBC SEEN     Gram Stain Report Called to,Read Back By and Verified With: EDGALL,E. AT 1618 ON 10.09.14 BY LOVE,T.   Report Status 05/01/2013 FINAL   Final  TISSUE CULTURE     Status: None   Collection Time    05/01/13  2:14 PM      Result Value Range Status   Specimen Description FOOT LEFT METATARSAL   Final   Special Requests PATIENT ON FOLLOWING VANCOMYCIN 1.5G ZOSYN 3.375G   Final   Gram Stain     Final   Value: NO WBC SEEN     NO ORGANISMS SEEN     Performed at [facility]   Culture     Final   Value: FEW GROUP B STREP(S.AGALACTIAE)ISOLATED     Note: TESTING AGAINST S. AGALACTIAE NOT ROUTINELY PERFORMED DUE TO PREDICTABILITY OF AMP/PEN/VAN SUSCEPTIBILITY.     Performed at [facility]   Report Status 05/04/2013 FINAL   Final  ANAEROBIC CULTURE     Status: None   Collection Time    05/01/13  2:14 PM      Result Value Range Status   Specimen Description FOOT LEFT METATARSAL   Final   Special Requests PATIENT ON FOLLOWING VANCOMYCIN 1.5G ZOSYN 3.375G   Final   Gram Stain     Final   Value: NO WBC SEEN     NO ORGANISMS SEEN     Performed at [facility]   Culture     Final   Value: NO ANAEROBES ISOLATED     Performed at [facility]   Report Status 05/06/2013 FINAL   Final  GRAM STAIN     Status: None   Collection Time    05/01/13  2:14 PM      Result Value Range Status   Specimen Description FOOT LEFT METATARSAL   Final   Special Requests PATIENT ON FOLLOWING VANCOMYCIN 1.5G ZOSYN 3.375G   Final   Gram Stain     Final   Value: NO WBC SEEN     FEW GRAM POSITIVE COCCI     Gram Stain Report Called to,Read Back By and Verified With: EDGAL,E. AT 1617 ON 10.09.14 BY LOVE,T.   Report  Status 05/01/2013 FINAL   Final  MRSA PCR SCREENING     Status: None   Collection Time    05/04/13  8:18 AM      Result Value Range Status   MRSA by PCR NEGATIVE  NEGATIVE Final   Comment:            The GeneXpert MRSA Assay (FDA     approved for NASAL specimens     only), is one component of a     comprehensive MRSA colonization     surveillance program. It is not     intended to diagnose MRSA     infection nor to guide or     monitor treatment for     MRSA infections.    Studies/Results: No results found.    Assessment/Plan: Karen Bennett is a 37 y.o. female with recent amputation site abscess with MSSA and MSSA bacteremia in February, now admitted with abscess at the same site  sp I andD with GBS from cultures   #1 Foot abscess Osteomyelitis with GBS in Diabetic:   Continue IV  Rocephin daily and flagyl in case she had concommittant anerobes playing a role.   I am remain concerneabout her ability to salvage her amputation site. This is the second hospitalization in past 8 months and the last time she actually became bacteremic. EVen if We do cure her current infection I am worried she will yet again succumb to infection at this site   Dr Lajoyce Corners and Dr Otelia Sergeant have conferred and will go forward with IV abx and local wound care  For now would plan on at least 6 weeks of postop rocephin +/- oral flagyl (if she can tolerate this oral antibiotic with fu in my clinic  She should have weekly CBC, BMP while on these antibiotics  #2 DM: control of her DM is critical for her wound healing etc  I will arrange Hospital followup in our clinic and have given her my card  Please call with further questions   LOS: 9 days   Acey Lav 05/09/2013, 10:15 AM

## 2013-05-09 NOTE — Progress Notes (Addendum)
Clinical Social Work  CSW continues to discuss case with Chiropodist and to search for SNF options at [place].   [facility], [facility], and [facility] all report that they cannot accept patient because they do not offer hydrotherapy. CSW has not received a response from [facility] at this time.   CSW also contacted medical director (Dr. Jacky Kindle) who states he will call CSW with any further suggestions for placement. Dr. Jacky Kindle requests to be kept updated on [facility] decision. CSW called [facility] who reports MD is still reviewing case and to follow up on Monday regarding their decision.  CSW will continue to follow.  [place], [place] [phone]

## 2013-05-10 LAB — GLUCOSE, CAPILLARY
Glucose-Capillary: 167 mg/dL — ABNORMAL HIGH (ref 70–99)
Glucose-Capillary: 172 mg/dL — ABNORMAL HIGH (ref 70–99)
Glucose-Capillary: 187 mg/dL — ABNORMAL HIGH (ref 70–99)
Glucose-Capillary: 195 mg/dL — ABNORMAL HIGH (ref 70–99)

## 2013-05-10 LAB — BASIC METABOLIC PANEL
BUN: 12 mg/dL (ref 6–23)
Calcium: 9.1 mg/dL (ref 8.4–10.5)
Creatinine, Ser: 1.81 mg/dL — ABNORMAL HIGH (ref 0.50–1.10)
GFR calc Af Amer: 41 mL/min — ABNORMAL LOW (ref >90)
GFR calc non Af Amer: 35 mL/min — ABNORMAL LOW (ref >90)
Sodium: 133 mEq/L — ABNORMAL LOW (ref 135–145)

## 2013-05-10 LAB — CBC
HCT: 28.1 % — ABNORMAL LOW (ref 36.0–46.0)
MCHC: 33.5 g/dL (ref 30.0–36.0)
MCV: 87.3 fL (ref 78.0–100.0)
RDW: 13 % (ref 11.5–15.5)

## 2013-05-10 MED ORDER — OXYCODONE HCL 5 MG PO TABS
10.0000 mg | ORAL_TABLET | ORAL | Status: DC | PRN
  Administered 2013-05-10 – 2013-05-11 (×5): 10 mg via ORAL
  Filled 2013-05-10 (×6): qty 2

## 2013-05-10 MED ORDER — GABAPENTIN 300 MG PO CAPS
300.0000 mg | ORAL_CAPSULE | Freq: Three times a day (TID) | ORAL | Status: DC
  Administered 2013-05-10 – 2013-05-12 (×7): 300 mg via ORAL
  Filled 2013-05-10 (×9): qty 1

## 2013-05-10 NOTE — Progress Notes (Signed)
[facility] PROGRESS NOTE  Karen Bennett XBJ:[id number] DOB: [birth year] DOA: 04/30/2013 PCP: No primary provider on file.  Assessment/Plan: Osteomyelitis fourth/fifth left metatarsal; wound care per orthopedic surgery - Continue antibiotics per ID recommendations; ceftriaxone+ metronidazole for 6 weeks, PICC line placed.  - Continue rounds of bedside hydrotherapy to left foot per surgery - placement to SNF pending.  - Dr. Otelia Sergeant and Dr. Lajoyce Corners evaluated patient regarding treatment options of transtibial amputation vs antibiotics with close monitoring for now. Patient decided to try and avoid amputation and will plan for antibiotics. Will need very close monitoring as an outpatient.  IDDM - increased SSI to Resistant and increase  Lantus 40 unit QD, -Hemoglobin A1c 04/30/2013= 13 Hyponatremia; continue to monitor still slightly low but stable.  Hypokalemia; within normal limits this morning Anemia - multifactorial with active infection, minimal blood loss in her wound vac. Stable.  HLD; -Lipid panel; not within ADA guidelines (HDL low, TG= 520), started on Lipitor 40 mg; will also  probably require niacin Phantom Pain; continue Neurontin, increase to TID given ongoing reported  Hypomagnesemia; within normal limits continue to monitor  Acute renal insufficiency; patient's creatinine is trending up, now stable. Likely multifactorial, antibiotics, infection in the setting of background with poorly controlled DM. Monitor.    Code Status:  Full Family Communication: Disposition Plan: SNF once placement available.   Consultants:  Orthopedic surgery  Procedures: 05/01/2013  INCISION AND DRAINAGE LEFT FOREFOOT ABCESS (Left) by Dr Kerrin Champagne (orthopedics)  Gram stain shows few gram-positive cocci 05/03/2013 Wound cultures; RARE GROUP B STREP(S.AGALACTIAE)ISOLATED (final 05/03/2013)  Antibiotics:  Zosyn 10/9>> stopped 10/14   Vancomycin 10/9>> stopped 10/14  Ceftriaxone 10/14>>  stop date 11/18  Metronidazole 10/14 stop date 11/18  Subjective: - states her pain is not that well controlled.  Objective: Filed Vitals:   05/09/13 0504 05/09/13 1429 05/09/13 2200 05/10/13 0700  BP: 149/79 155/84 152/82 121/67  Pulse: 76 75 78 69  Temp: 98.7 F (37.1 C) 98.2 F (36.8 C) 99.4 F (37.4 C) 98.6 F (37 C)  TempSrc: Oral Oral Oral Oral  Resp: 18 20 18 18   Height:      Weight:      SpO2: 99% 96% 96% 94%    Intake/Output Summary (Last 24 hours) at 05/10/13 1008 Last data filed at 05/10/13 3086  Gross per 24 hour  Intake 4726.67 ml  Output      0 ml  Net 4726.67 ml   Filed Weights   04/30/13 1856 04/30/13 2100  Weight: 163.295 kg (360 lb) 170.7 kg (376 lb 5.2 oz)    Exam:  General:  A./O. X4, NAD  Cardiovascular: Regular rhythm and rate, negative murmurs rubs gallops   Respiratory: Good auscultation  Abdomen: obese, nontender, plus bowel some  Musculoskeletal: Left foot wound VAC present over the remnants of fourth and fifth metatarsal. Negative warm to touch, swelling almost completely back to baseline     Data Reviewed: Basic Metabolic Panel:  Recent Labs Lab 05/06/13 0535 05/07/13 0425 05/08/13 0530 05/09/13 0435 05/10/13 0415  NA 132* 134* 134* 133* 133*  K 3.7 3.6 3.9 4.1 3.9  CL 100 99 102 101 101  CO2 21 22 22 22 22   GLUCOSE 203* 178* 199* 197* 179*  BUN 9 12 12 11 12   CREATININE 1.52* 1.66* 1.74* 1.72* 1.81*  CALCIUM 9.3 9.5 9.0 9.1 9.1  MG 1.7 1.6 1.7 1.6 1.7   Liver Function Tests:  Recent Labs Lab 05/05/13 0550 05/06/13 0535 05/07/13  0425 05/08/13 0530 05/09/13 0435  AST 19 14 12 10 11   ALT 13 13 11 10 10   ALKPHOS 49 47 46 45 54  BILITOT 0.4 0.4 0.3 0.2* 0.3  PROT 6.4 6.7 6.6 6.9 7.2  ALBUMIN 2.3* 2.4* 2.5* 2.6* 2.8*   CBC:  Recent Labs Lab 05/05/13 0550 05/06/13 0535 05/07/13 0425 05/08/13 0530 05/09/13 0435 05/10/13 0415  WBC 5.8 7.6 6.4 6.7 6.9 6.2  NEUTROABS 2.7 4.6 3.7 4.0 4.5  --   HGB 10.2*  9.9* 9.7* 9.4* 9.9* 9.4*  HCT 30.1* 29.4* 28.7* 28.7* 29.6* 28.1*  MCV 86.5 87.2 87.2 87.5 87.6 87.3  PLT 297 314 286 285 335 302   CBG:  Recent Labs Lab 05/09/13 1811 05/09/13 2038 05/10/13 0019 05/10/13 0431 05/10/13 0742  GLUCAP 220* 195* 229* 157* 168*    Recent Results (from the past 240 hour(s))  CULTURE, ROUTINE-ABSCESS     Status: None   Collection Time    04/30/13 11:00 PM      Result Value Range Status   Specimen Description ABSCESS LEFT FOOT   Final   Special Requests NONE   Final   Gram Stain     Final   Value: RARE WBC PRESENT, PREDOMINANTLY PMN     NO SQUAMOUS EPITHELIAL CELLS SEEN     NO ORGANISMS SEEN     Performed at [facility]   Culture     Final   Value: RARE GROUP B STREP(S.AGALACTIAE)ISOLATED     Note: TESTING AGAINST S. AGALACTIAE NOT ROUTINELY PERFORMED DUE TO PREDICTABILITY OF AMP/PEN/VAN SUSCEPTIBILITY.     Performed at [facility]   Report Status 05/03/2013 FINAL   Final  ANAEROBIC CULTURE     Status: None   Collection Time    05/01/13  2:08 PM      Result Value Range Status   Specimen Description WOUND ABSCESS LEFT FOOT   Final   Special Requests PATIENT ON FOLLOWING VANCOMYCIN 1.5G ZOSYN 3.375G   Final   Gram Stain     Final   Value: RARE WBC PRESENT,BOTH PMN AND MONONUCLEAR     NO SQUAMOUS EPITHELIAL CELLS SEEN     RARE GRAM POSITIVE COCCI     IN PAIRS     Performed at [facility]   Culture     Final   Value: NO ANAEROBES ISOLATED     Performed at [facility]   Report Status 05/06/2013 FINAL   Final  WOUND CULTURE     Status: None   Collection Time    05/01/13  2:08 PM      Result Value Range Status   Specimen Description WOUND ABSCESS LEFT FOOT   Final   Special Requests PATIENT ON FOLLOWING VANCOMYCIN 1.5G ZOSYN 3.375G   Final   Gram Stain     Final   Value: RARE WBC PRESENT,BOTH PMN AND MONONUCLEAR     NO SQUAMOUS EPITHELIAL CELLS SEEN     RARE GRAM POSITIVE COCCI     IN PAIRS      Performed at [facility]   Culture     Final   Value: FEW GROUP B STREP(S.AGALACTIAE)ISOLATED     Note: TESTING AGAINST S. AGALACTIAE NOT ROUTINELY PERFORMED DUE TO PREDICTABILITY OF AMP/PEN/VAN SUSCEPTIBILITY.     Performed at [facility]   Report Status 05/03/2013 FINAL   Final  GRAM STAIN     Status: None   Collection Time    05/01/13  2:08 PM      Result Value Range Status   Specimen Description WOUND ABSCESS LEFT FOOT   Final   Special Requests PATIENT ON FOLLOWING VANCOMYCIN 1.5G ZOSYN 3.375G   Final   Gram Stain     Final   Value: NO ORGANISMS SEEN     NO WBC SEEN     Gram Stain Report Called to,Read Back By and Verified With: EDGALL,E. AT 1618 ON 10.09.14 BY LOVE,T.   Report Status 05/01/2013 FINAL   Final  TISSUE CULTURE     Status: None   Collection Time    05/01/13  2:14 PM      Result Value Range Status   Specimen Description FOOT LEFT METATARSAL   Final   Special Requests PATIENT ON FOLLOWING VANCOMYCIN 1.5G ZOSYN 3.375G   Final   Gram Stain     Final   Value: NO WBC SEEN     NO ORGANISMS SEEN     Performed at [facility]   Culture     Final   Value: FEW GROUP B STREP(S.AGALACTIAE)ISOLATED     Note: TESTING AGAINST S. AGALACTIAE NOT ROUTINELY PERFORMED DUE TO PREDICTABILITY OF AMP/PEN/VAN SUSCEPTIBILITY.     Performed at [facility]   Report Status 05/04/2013 FINAL   Final  ANAEROBIC CULTURE     Status: None   Collection Time    05/01/13  2:14 PM      Result Value Range Status   Specimen Description FOOT LEFT METATARSAL   Final   Special Requests PATIENT ON FOLLOWING VANCOMYCIN 1.5G ZOSYN 3.375G   Final   Gram Stain     Final   Value: NO WBC SEEN     NO ORGANISMS SEEN     Performed at [facility]   Culture     Final   Value: NO ANAEROBES ISOLATED     Performed at [facility]   Report Status 05/06/2013 FINAL   Final  GRAM STAIN     Status: None   Collection Time    05/01/13  2:14 PM      Result Value  Range Status   Specimen Description FOOT LEFT METATARSAL   Final   Special Requests PATIENT ON FOLLOWING VANCOMYCIN 1.5G ZOSYN 3.375G   Final   Gram Stain     Final   Value: NO WBC SEEN     FEW GRAM POSITIVE COCCI     Gram Stain Report Called to,Read Back By and Verified With: EDGAL,E. AT 1617 ON 10.09.14 BY LOVE,T.   Report Status 05/01/2013 FINAL   Final  MRSA PCR SCREENING     Status: None   Collection Time    05/04/13  8:18 AM      Result Value Range Status   MRSA by PCR NEGATIVE  NEGATIVE Final   Comment:            The GeneXpert MRSA Assay (FDA     approved for NASAL specimens     only), is one component of a     comprehensive MRSA colonization     surveillance program. It is not     intended to diagnose MRSA     infection nor to guide or     monitor treatment for     MRSA infections.     Studies: No results found.  Scheduled Meds: . atorvastatin  40 mg Oral q1800  . cefTRIAXone (ROCEPHIN)  IV  2 g Intravenous Q24H  . enoxaparin (  LOVENOX) injection  85 mg Subcutaneous Q24H  . ferrous sulfate  325 mg Oral TID PC  . gabapentin  300 mg Oral TID  . insulin aspart  0-20 Units Subcutaneous Q4H  . insulin glargine  40 Units Subcutaneous Daily  . magnesium sulfate 1 - 4 g bolus IVPB  2 g Intravenous Once  . metroNIDAZOLE  500 mg Oral Q8H   Continuous Infusions: . sodium chloride 50 mL/hr at 05/09/13 1850   Principal Problem:   Diabetic foot ulcer Active Problems:   DM (diabetes mellitus), type 2, uncontrolled with complications   Obesity, Class III, BMI 40-49.9 (morbid obesity)   Hyponatremia   Osteomyelitis   Cellulitis and abscess of leg, except foot   Diabetic peripheral neuropathy associated with type 2 diabetes mellitus   Hypokalemia   HLD (hyperlipidemia)   Hypomagnesemia   Phantom limb pain   Acute renal insufficiency  Time spent: 25 minutes  Pamella Pert  [facility] Pager [phone]. If 7PM-7AM, please contact night-coverage at  [email or web address], password [facility] 05/10/2013, 10:08 AM  LOS: 10 days

## 2013-05-10 NOTE — Progress Notes (Signed)
Subjective: 9 Days Post-Op Procedure(s) (LRB): INCISION AND DRAINAGE LEFT FOREFOOT ABCESS  (Left) APPLICATION OF WOUND VAC (Left) Patient reports pain as mild.    Objective: Vital signs in last 24 hours: Temp:  [98.2 F (36.8 C)-99.4 F (37.4 C)] 98.6 F (37 C) (10/18 0700) Pulse Rate:  [69-78] 69 (10/18 0700) Resp:  [18-20] 18 (10/18 0700) BP: (121-155)/(67-84) 121/67 mmHg (10/18 0700) SpO2:  [94 %-96 %] 94 % (10/18 0700)  Intake/Output from previous day: 10/17 0701 - 10/18 0700 In: 5326.7 [P.O.:1080; I.V.:4196.7; IV Piggyback:50] Out: -  Intake/Output this shift:     Recent Labs  05/08/13 0530 05/09/13 0435 05/10/13 0415  HGB 9.4* 9.9* 9.4*    Recent Labs  05/09/13 0435 05/10/13 0415  WBC 6.9 6.2  RBC 3.38* 3.22*  HCT 29.6* 28.1*  PLT 335 302    Recent Labs  05/09/13 0435 05/10/13 0415  NA 133* 133*  K 4.1 3.9  CL 101 101  CO2 22 22  BUN 11 12  CREATININE 1.72* 1.81*  GLUCOSE 197* 179*  CALCIUM 9.1 9.1   No results found for this basename: LABPT, INR,  in the last 72 hours  VAC working no leak.    Assessment/Plan: 9 Days Post-Op Procedure(s) (LRB): INCISION AND DRAINAGE LEFT FOREFOOT ABCESS  (Left) APPLICATION OF WOUND VAC (Left) Discharge to SNF  She has no money to get insulin and without diabetic control she has no chance of avoiding amputation and multiple other diabetic complications.    Aly Hauser C 05/10/2013, 10:22 AM

## 2013-05-10 NOTE — Progress Notes (Addendum)
05/10/13 1323 [id number]   Subjective Assessment  Subjective I hope it looks better.  Date of Onset 04/21/13  Evaluation and Treatment  Evaluation and Treatment Procedures Explained to Patient/Family Yes  Evaluation and Treatment Procedures agreed to  Wound 05/05/13 Diabetic ulcer Foot Left L lateral foot I & D   Date First Assessed/Time First Assessed: 05/05/13 1030   Wound Type: Diabetic ulcer  Location: Foot  Location Orientation: Left  Wound Description (Comments): L lateral foot I & D   Present on Admission: Yes  Site / Wound Assessment Red;Granulation tissue;Yellow (still 2x2 area on periwound around 11:00)  % Wound base Red or Granulating 90%  % Wound base Yellow 10%  % Wound base Black 0%  Peri-wound Assessment Maceration;Edema  Wound Length (cm) 10 cm  Wound Width (cm) 8.5 cm  Wound Depth (cm) 2.5 cm (at 11:00 a new small hole has developed.)  Margins Unattacted edges (unapproximated)  Closure None  Drainage Amount Minimal (in VAC)  Drainage Description Serosanguineous  Non-staged Wound Description Full thickness  Treatment Debridement (Selective);Hydrotherapy (Pulse lavage)  Dressing Type (VAC placed after by nursing)  Dressing Status Dry;Clean;Intact  Hydrotherapy  Pulsed Lavage with Suction (psi) 4 psi (varied 8-12)  Pulsed Lavage with Suction - Normal Saline Used 1000 mL  Pulsed Lavage Tip Tip with splash shield  Pulsed lavage therapy - wound location L lateral foot  Selective Debridement  Selective Debridement - Location L Lateral foot 9:00-12:00 location  Selective Debridement - Tools Used Forceps;Scissors  Selective Debridement - Tissue Removed yellow necrotic tissue  Wound Therapy - Assess/Plan/Recommendations  Wound Therapy - Clinical Statement Continues to look better.Per WOC RN, a hole has developed at 11:00. Plan to not perform PLS on 05/11/13 and reassess 05/12/13. VAC will remain until 10/20. Appears wound may not warrant PLS due to % of red tissue in  wound.    Wound Therapy - Functional Problem List Chronic diabetic wound  Factors Delaying/Impairing Wound Healing Diabetes Mellitus  Hydrotherapy Plan Debridement;Patient/family education;Pulsatile lavage with suction  Wound Therapy - Frequency 6X / week  Wound Therapy - Current Recommendations Case manager/social work;Diabetic teaching;Nutritionist  Wound Plan to continue to provide pulsed lavage with debridement once less than 5% , may need to decrease frequency in order to not be disruptive to granualating tissue in VAC placment and decrease to 3x /week or none once wound bed imporves.   Wound Therapy Goals - Improve the function of patient's integumentary system by progressing the wound(s) through the phases of wound healing by:  Decrease Necrotic Tissue to less than 5%  Decrease Necrotic Tissue - Progress Progressing toward goal  Increase Granulation Tissue to 95% or greater  Blanchard Kelch PT [phone]

## 2013-05-10 NOTE — Consult Note (Signed)
WOC wound consult note Reason for Consult:Revaluation of wound following hydrotherapy session and application of NPWT Wound type:Neuropathic ulceration with osteomyelitis Pressure Ulcer POA: Yes Measurement:10cm x 8.5cm x 2cm with new "pocket"  Of depth measuring 0.5cm Wound bed:red granulation tissue with new area of depth at 11 o'clock.   Drainage (amount, consistency, odor) serosangunious in cannister Periwound: area of yellow slough measuring 1cm x 2cm. Dressing procedure/placement/frequency: NPWT ([employer] applied, one single piece of black GranuFoam applied to wound bed and secured with drape.  negative pressure tolerated without difficulty. Together with PT, we have determined that it is acceptable to forego hydrotherapy treatment and dressing change tomorrow.  We will meet at bedside on Monday, 10/20 and reevaluate wound.  Treatment plan will be adjust accordingly at that time. Thanks, Ladona Mow, MSN, RN, GNP, [place], CWON-AP [phone])

## 2013-05-10 NOTE — Progress Notes (Signed)
Follow visit to assess the effectiveness of mediation exercises and the check on Karen Bennett's spiritual pain/distress.  We talked about her ability to see through the pain to walk on her infected foot, and to see through her stress.  Karen Bennett was less stressed than when I saw her last night. This is a positive outcome.   Recommend that the care team continue to provide information to her concerning her status as one who has been denied Medicaid and health insurance. These stessors can be lessened by a person with proper knowledge and authority to assist her is finding why she cannot get proper medications for her diabetes.  Continued chaplain care is indicated as having a positive and affirming affect on Karen Bennett's overall health.  Benjie Karvonen. Meria Crilly, DMin, MDiv Chaplain

## 2013-05-11 LAB — BASIC METABOLIC PANEL
CO2: 23 mEq/L (ref 19–32)
Calcium: 9.2 mg/dL (ref 8.4–10.5)
Chloride: 100 mEq/L (ref 96–112)
Creatinine, Ser: 1.82 mg/dL — ABNORMAL HIGH (ref 0.50–1.10)
GFR calc Af Amer: 41 mL/min — ABNORMAL LOW (ref >90)
Potassium: 3.9 mEq/L (ref 3.5–5.1)
Sodium: 133 mEq/L — ABNORMAL LOW (ref 135–145)

## 2013-05-11 LAB — GLUCOSE, CAPILLARY
Glucose-Capillary: 166 mg/dL — ABNORMAL HIGH (ref 70–99)
Glucose-Capillary: 166 mg/dL — ABNORMAL HIGH (ref 70–99)
Glucose-Capillary: 193 mg/dL — ABNORMAL HIGH (ref 70–99)

## 2013-05-11 LAB — MAGNESIUM: Magnesium: 1.6 mg/dL (ref 1.5–2.5)

## 2013-05-11 LAB — CBC
Hemoglobin: 9.4 g/dL — ABNORMAL LOW (ref 12.0–15.0)
MCH: 29.6 pg (ref 26.0–34.0)
Platelets: 328 10*3/uL (ref 150–400)
RBC: 3.18 MIL/uL — ABNORMAL LOW (ref 3.87–5.11)
RDW: 12.9 % (ref 11.5–15.5)
WBC: 6.9 10*3/uL (ref 4.0–10.5)

## 2013-05-11 NOTE — Progress Notes (Signed)
[facility] PROGRESS NOTE  Candy Kitchen Yinger BJY:[id number] DOB: [birth year] DOA: 04/30/2013 PCP: No primary provider on file.  Assessment/Plan: Osteomyelitis fourth/fifth left metatarsal; - Dr. Otelia Sergeant and Dr. Lajoyce Corners evaluated patient regarding treatment options of transtibial amputation vs antibiotics with close monitoring for now. Patient decided to try and avoid amputation and will plan for antibiotics. Will need very close monitoring as an outpatient.  - antibiotics per ID recommendations; ceftriaxone+ metronidazole for 6 weeks, PICC line placed.  - Continue rounds of bedside hydrotherapy to left foot per surgery - placement to SNF pending.  Acute renal insufficiency; patient's creatinine is trending up, now stable. Likely multifactorial, antibiotics, infection in the setting of background with poorly controlled DM. Monitor.   IDDM - increased SSI to Resistant and increase  Lantus 40 unit QD, -Hemoglobin A1c 04/30/2013= 13 Hyponatremia; continue to monitor still slightly low but stable.  Hypokalemia; within normal limits this morning Anemia - multifactorial with active infection, minimal blood loss in her wound vac. Stable.  HLD; -Lipid panel; not within ADA guidelines (HDL low, TG= 520), started on Lipitor 40 mg; will also  probably require niacin Phantom Pain; continue Neurontin, increased to TID 10/18. Better today.  Hypomagnesemia; within normal limits continue to monitor. Additional Mg 10/19  Code Status:  Full Family Communication: Disposition Plan: SNF once placement available.   Consultants:  Orthopedic surgery  Procedures: 05/01/2013  INCISION AND DRAINAGE LEFT FOREFOOT ABCESS (Left) by Dr Kerrin Champagne (orthopedics)  Gram stain shows few gram-positive cocci 05/03/2013 Wound cultures; RARE GROUP B STREP(S.AGALACTIAE)ISOLATED (final 05/03/2013)  Antibiotics:  Zosyn 10/9>> stopped 10/14   Vancomycin 10/9>> stopped 10/14  Ceftriaxone 10/14>> stop date  11/18  Metronidazole 10/14 stop date 11/18  Subjective: - pain better this morning.  Objective: Filed Vitals:   05/10/13 0700 05/10/13 1454 05/10/13 2207 05/11/13 0605  BP: 121/67 139/73 147/71 134/81  Pulse: 69 79 69 66  Temp: 98.6 F (37 C) 98 F (36.7 C) 99 F (37.2 C) 99.1 F (37.3 C)  TempSrc: Oral Oral Oral Oral  Resp: 18 18 18 18   Height:      Weight:      SpO2: 94% 95% 95% 96%    Intake/Output Summary (Last 24 hours) at 05/11/13 1059 Last data filed at 05/10/13 1900  Gross per 24 hour  Intake    480 ml  Output      0 ml  Net    480 ml   Filed Weights   04/30/13 1856 04/30/13 2100  Weight: 163.295 kg (360 lb) 170.7 kg (376 lb 5.2 oz)    Exam:  General:  A./O. X4, NAD  Cardiovascular: Regular rhythm and rate, negative murmurs rubs gallops   Respiratory: Good auscultation  Abdomen: obese, nontender, plus bowel some  Musculoskeletal: Left foot wound VAC present over the remnants of fourth and fifth metatarsal. Negative warm to touch, swelling almost completely back to baseline     Data Reviewed: Basic Metabolic Panel:  Recent Labs Lab 05/07/13 0425 05/08/13 0530 05/09/13 0435 05/10/13 0415 05/11/13 0415  NA 134* 134* 133* 133* 133*  K 3.6 3.9 4.1 3.9 3.9  CL 99 102 101 101 100  CO2 22 22 22 22 23   GLUCOSE 178* 199* 197* 179* 192*  BUN 12 12 11 12 13   CREATININE 1.66* 1.74* 1.72* 1.81* 1.82*  CALCIUM 9.5 9.0 9.1 9.1 9.2  MG 1.6 1.7 1.6 1.7 1.6   Liver Function Tests:  Recent Labs Lab 05/05/13 0550 05/06/13 0535 05/07/13 0425 05/08/13  0530 05/09/13 0435  AST 19 14 12 10 11   ALT 13 13 11 10 10   ALKPHOS 49 47 46 45 54  BILITOT 0.4 0.4 0.3 0.2* 0.3  PROT 6.4 6.7 6.6 6.9 7.2  ALBUMIN 2.3* 2.4* 2.5* 2.6* 2.8*   CBC:  Recent Labs Lab 05/05/13 0550 05/06/13 0535 05/07/13 0425 05/08/13 0530 05/09/13 0435 05/10/13 0415 05/11/13 0415  WBC 5.8 7.6 6.4 6.7 6.9 6.2 6.9  NEUTROABS 2.7 4.6 3.7 4.0 4.5  --   --   HGB 10.2* 9.9* 9.7*  9.4* 9.9* 9.4* 9.4*  HCT 30.1* 29.4* 28.7* 28.7* 29.6* 28.1* 27.9*  MCV 86.5 87.2 87.2 87.5 87.6 87.3 87.7  PLT 297 314 286 285 335 302 328   CBG:  Recent Labs Lab 05/10/13 1601 05/10/13 2005 05/10/13 2357 05/11/13 0400 05/11/13 0730  GLUCAP 187* 172* 167* 166* 137*    Recent Results (from the past 240 hour(s))  ANAEROBIC CULTURE     Status: None   Collection Time    05/01/13  2:08 PM      Result Value Range Status   Specimen Description WOUND ABSCESS LEFT FOOT   Final   Special Requests PATIENT ON FOLLOWING VANCOMYCIN 1.5G ZOSYN 3.375G   Final   Gram Stain     Final   Value: RARE WBC PRESENT,BOTH PMN AND MONONUCLEAR     NO SQUAMOUS EPITHELIAL CELLS SEEN     RARE GRAM POSITIVE COCCI     IN PAIRS     Performed at [facility]   Culture     Final   Value: NO ANAEROBES ISOLATED     Performed at [facility]   Report Status 05/06/2013 FINAL   Final  WOUND CULTURE     Status: None   Collection Time    05/01/13  2:08 PM      Result Value Range Status   Specimen Description WOUND ABSCESS LEFT FOOT   Final   Special Requests PATIENT ON FOLLOWING VANCOMYCIN 1.5G ZOSYN 3.375G   Final   Gram Stain     Final   Value: RARE WBC PRESENT,BOTH PMN AND MONONUCLEAR     NO SQUAMOUS EPITHELIAL CELLS SEEN     RARE GRAM POSITIVE COCCI     IN PAIRS     Performed at [facility]   Culture     Final   Value: FEW GROUP B STREP(S.AGALACTIAE)ISOLATED     Note: TESTING AGAINST S. AGALACTIAE NOT ROUTINELY PERFORMED DUE TO PREDICTABILITY OF AMP/PEN/VAN SUSCEPTIBILITY.     Performed at [facility]   Report Status 05/03/2013 FINAL   Final  GRAM STAIN     Status: None   Collection Time    05/01/13  2:08 PM      Result Value Range Status   Specimen Description WOUND ABSCESS LEFT FOOT   Final   Special Requests PATIENT ON FOLLOWING VANCOMYCIN 1.5G ZOSYN 3.375G   Final   Gram Stain     Final   Value: NO ORGANISMS SEEN     NO WBC SEEN     Gram Stain Report Called  to,Read Back By and Verified With: EDGALL,E. AT 1618 ON 10.09.14 BY LOVE,T.   Report Status 05/01/2013 FINAL   Final  TISSUE CULTURE     Status: None   Collection Time    05/01/13  2:14 PM      Result Value Range Status   Specimen Description FOOT LEFT METATARSAL   Final   Special Requests  PATIENT ON FOLLOWING VANCOMYCIN 1.5G ZOSYN 3.375G   Final   Gram Stain     Final   Value: NO WBC SEEN     NO ORGANISMS SEEN     Performed at [facility]   Culture     Final   Value: FEW GROUP B STREP(S.AGALACTIAE)ISOLATED     Note: TESTING AGAINST S. AGALACTIAE NOT ROUTINELY PERFORMED DUE TO PREDICTABILITY OF AMP/PEN/VAN SUSCEPTIBILITY.     Performed at [facility]   Report Status 05/04/2013 FINAL   Final  ANAEROBIC CULTURE     Status: None   Collection Time    05/01/13  2:14 PM      Result Value Range Status   Specimen Description FOOT LEFT METATARSAL   Final   Special Requests PATIENT ON FOLLOWING VANCOMYCIN 1.5G ZOSYN 3.375G   Final   Gram Stain     Final   Value: NO WBC SEEN     NO ORGANISMS SEEN     Performed at [facility]   Culture     Final   Value: NO ANAEROBES ISOLATED     Performed at [facility]   Report Status 05/06/2013 FINAL   Final  GRAM STAIN     Status: None   Collection Time    05/01/13  2:14 PM      Result Value Range Status   Specimen Description FOOT LEFT METATARSAL   Final   Special Requests PATIENT ON FOLLOWING VANCOMYCIN 1.5G ZOSYN 3.375G   Final   Gram Stain     Final   Value: NO WBC SEEN     FEW GRAM POSITIVE COCCI     Gram Stain Report Called to,Read Back By and Verified With: EDGAL,E. AT 1617 ON 10.09.14 BY LOVE,T.   Report Status 05/01/2013 FINAL   Final  MRSA PCR SCREENING     Status: None   Collection Time    05/04/13  8:18 AM      Result Value Range Status   MRSA by PCR NEGATIVE  NEGATIVE Final   Comment:            The GeneXpert MRSA Assay (FDA     approved for NASAL specimens     only), is one component of a      comprehensive MRSA colonization     surveillance program. It is not     intended to diagnose MRSA     infection nor to guide or     monitor treatment for     MRSA infections.     Studies: No results found.  Scheduled Meds: . atorvastatin  40 mg Oral q1800  . cefTRIAXone (ROCEPHIN)  IV  2 g Intravenous Q24H  . enoxaparin (LOVENOX) injection  85 mg Subcutaneous Q24H  . ferrous sulfate  325 mg Oral TID PC  . gabapentin  300 mg Oral TID  . insulin aspart  0-20 Units Subcutaneous Q4H  . insulin glargine  40 Units Subcutaneous Daily  . magnesium sulfate 1 - 4 g bolus IVPB  2 g Intravenous Once  . metroNIDAZOLE  500 mg Oral Q8H   Continuous Infusions: . sodium chloride 50 mL/hr at 05/10/13 2130   Principal Problem:   Diabetic foot ulcer Active Problems:   DM (diabetes mellitus), type 2, uncontrolled with complications   Obesity, Class III, BMI 40-49.9 (morbid obesity)   Hyponatremia   Osteomyelitis   Cellulitis and abscess of leg, except foot   Diabetic peripheral neuropathy associated with type  2 diabetes mellitus   Hypokalemia   HLD (hyperlipidemia)   Hypomagnesemia   Phantom limb pain   Acute renal insufficiency  Time spent: 15 minutes  Pamella Pert  [facility] Pager [phone]. If 7PM-7AM, please contact night-coverage at [email or web address], password [facility] 05/11/2013, 10:59 AM  LOS: 11 days

## 2013-05-11 NOTE — Progress Notes (Signed)
Patient ID: Karen Bennett, female   DOB: [birth year], 37 y.o.   MRN: [id number] VAC low leak .  Major problem remains no money for insulin and noncompliance issues with no followup.  Plan SNF.  Patient does not know why she does not qualify for Medicaid.

## 2013-05-11 NOTE — Plan of Care (Signed)
Problem: Phase I Progression Outcomes Goal: OOB as tolerated unless otherwise ordered Outcome: Adequate for Discharge Ambulates with front wheel walker

## 2013-05-12 ENCOUNTER — Other Ambulatory Visit: Payer: Self-pay | Admitting: *Deleted

## 2013-05-12 ENCOUNTER — Inpatient Hospital Stay
Admission: RE | Admit: 2013-05-12 | Discharge: 2013-06-24 | Disposition: A | Discharge status: Home / Self Care | Payer: Medicaid Other | Source: Ambulatory Visit | Attending: Internal Medicine | Admitting: Internal Medicine

## 2013-05-12 DIAGNOSIS — L02419 Cutaneous abscess of limb, unspecified: Secondary | ICD-10-CM

## 2013-05-12 LAB — GLUCOSE, CAPILLARY
Glucose-Capillary: 166 mg/dL — ABNORMAL HIGH (ref 70–99)
Glucose-Capillary: 173 mg/dL — ABNORMAL HIGH (ref 70–99)
Glucose-Capillary: 213 mg/dL — ABNORMAL HIGH (ref 70–99)

## 2013-05-12 LAB — CBC
HCT: 27.3 % — ABNORMAL LOW (ref 36.0–46.0)
Hemoglobin: 9.2 g/dL — ABNORMAL LOW (ref 12.0–15.0)
MCH: 29.4 pg (ref 26.0–34.0)
MCHC: 33.7 g/dL (ref 30.0–36.0)
RBC: 3.13 MIL/uL — ABNORMAL LOW (ref 3.87–5.11)
RDW: 13.2 % (ref 11.5–15.5)

## 2013-05-12 LAB — MAGNESIUM: Magnesium: 1.6 mg/dL (ref 1.5–2.5)

## 2013-05-12 LAB — BASIC METABOLIC PANEL
BUN: 14 mg/dL (ref 6–23)
CO2: 23 mEq/L (ref 19–32)
Chloride: 101 mEq/L (ref 96–112)
GFR calc Af Amer: 45 mL/min — ABNORMAL LOW (ref >90)
GFR calc non Af Amer: 39 mL/min — ABNORMAL LOW (ref >90)
Glucose, Bld: 197 mg/dL — ABNORMAL HIGH (ref 70–99)
Potassium: 3.9 mEq/L (ref 3.5–5.1)

## 2013-05-12 MED ORDER — OXYCODONE-ACETAMINOPHEN 5-325 MG PO TABS: 1.0000 | ORAL_TABLET | ORAL | Status: DC | PRN

## 2013-05-12 MED ORDER — HEPARIN SOD (PORK) LOCK FLUSH 100 UNIT/ML IV SOLN
250.0000 [IU] | INTRAVENOUS | Status: AC | PRN
  Administered 2013-05-12 (×2): 250 [IU]

## 2013-05-12 MED ORDER — ATORVASTATIN CALCIUM 40 MG PO TABS: 40.0000 mg | ORAL_TABLET | Freq: Every day | ORAL | Status: DC

## 2013-05-12 MED ORDER — METRONIDAZOLE 500 MG PO TABS: 500.0000 mg | ORAL_TABLET | Freq: Three times a day (TID) | ORAL | Status: AC

## 2013-05-12 MED ORDER — ASPIRIN EC 325 MG PO TBEC: 325.0000 mg | DELAYED_RELEASE_TABLET | Freq: Two times a day (BID) | ORAL | Status: DC

## 2013-05-12 MED ORDER — GABAPENTIN 300 MG PO CAPS: 300.0000 mg | ORAL_CAPSULE | Freq: Three times a day (TID) | ORAL | Status: DC

## 2013-05-12 MED ORDER — INSULIN GLARGINE 100 UNIT/ML ~~LOC~~ SOLN: 40.0000 [IU] | Freq: Every day | SUBCUTANEOUS | Status: DC

## 2013-05-12 MED ORDER — FERROUS SULFATE 325 (65 FE) MG PO TABS: 325.0000 mg | ORAL_TABLET | Freq: Three times a day (TID) | ORAL | Status: DC

## 2013-05-12 MED ORDER — DEXTROSE 5 % IV SOLN: 2.0000 g | INTRAVENOUS | Status: AC

## 2013-05-12 NOTE — Discharge Summary (Signed)
Physician Discharge Summary  [place] Furtick ZOX:[id number] DOB: [birth year] DOA: 04/30/2013  PCP: No primary provider on file.  Admit date: 04/30/2013 Discharge date: 05/12/2013  Time spent: >35 minutes  Recommendations for Outpatient Follow-up:  F/u with orthopedics in 2 weeks F/u with infectious disease in 2-3 weeks Discharge Diagnoses:  Principal Problem:   Diabetic foot ulcer Active Problems:   DM (diabetes mellitus), type 2, uncontrolled with complications   Obesity, Class III, BMI 40-49.9 (morbid obesity)   Hyponatremia   Osteomyelitis   Cellulitis and abscess of leg, except foot   Diabetic peripheral neuropathy associated with type 2 diabetes mellitus   Hypokalemia   HLD (hyperlipidemia)   Hypomagnesemia   Phantom limb pain   Acute renal insufficiency   Discharge Condition: stable   Diet recommendation: DM   Filed Weights   04/30/13 1856 04/30/13 2100  Weight: 163.295 kg (360 lb) 170.7 kg (376 lb 5.2 oz)    History of present illness:  37 yo female diabetic not taking any meds due to lack of health insurance comes in with several days of worsening swelling and redness to left foot. 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 Osteomyelitis fourth/fifth left metatarsal; - Dr. Otelia Sergeant and Dr. Lajoyce Corners evaluated patient regarding treatment options of transtibial amputation vs antibiotics with close monitoring for now. Patient decided to try and avoid amputation and will plan for antibiotics. Will need very close monitoring as an outpatient.  - antibiotics per ID recommendations; ceftriaxone+ metronidazole for 6 weeks, PICC line placed; need to f/u with ID, ortho if need to change th duration on IV atx; Marland Kitchen  - Continue rounds of bedside hydrotherapy to left foot per surgery  - outpatient f/ui with ID, and ortho in 2-3 weeks  2. Acute renal insufficiency; improved   3. IDDM - increased SSI to Resistant and increased Lantus 40 unit QD,  Hemoglobin A1c 04/30/2013= 13  4. Hyponatremia; hypo K, improved  5. Anemia - multifactorial with active infection, minimal blood loss in her wound vac. Stable.  6. HLD; -Lipid panel; not within ADA guidelines (HDL low, TG= 520), started on Lipitor 40 mg; will also probably require niacin  9. Phantom Pain; continue Neurontin, increased to TID 10/18. Improving  9. Hypomagnesemia; within normal limits continue to monitor. Additional Mg 10/19     Procedures: I&D by ortho  Consultations:  ID, ortho pedics  05/01/2013 INCISION AND DRAINAGE LEFT FOREFOOT ABCESS (Left) by Dr Kerrin Champagne (orthopedics)  Gram stain shows few gram-positive cocci 05/03/2013  Wound cultures; RARE GROUP B STREP(S.AGALACTIAE)ISOLATED (final 05/03/2013)  Antibiotics:  Zosyn 10/9>> stopped 10/14  Vancomycin 10/9>> stopped 10/14  Ceftriaxone 10/14>> stop date 11/18  Metronidazole 10/14 stop date 11/18  Discharge Exam: Filed Vitals:   05/12/13 0624  BP: 143/84  Pulse: 74  Temp: 98.7 F (37.1 C)  Resp: 20    General: alert  Cardiovascular: s1,s2 rrr Respiratory: cta BL   Discharge Instructions  Discharge Orders   Future Appointments Provider Department Dept Phone   05/22/2013 3:00 PM Cliffton Asters, MD [facility] [phone]   Future Orders Complete By Expires   Diet - low sodium heart healthy  As directed    Discharge instructions  As directed    Comments:     Follow up with orthopedics in 2 weeks Follow up with infectious disease in 2-3 week s   Increase activity slowly  As directed  Medication List    STOP taking these medications       cephALEXin 500 MG capsule  Commonly known as:  KEFLEX      TAKE these medications       atorvastatin 40 MG tablet  Commonly known as:  LIPITOR  Take 1 tablet (40 mg total) by mouth daily at 6 PM.     dextrose 5 % SOLN 50 mL with cefTRIAXone 2 G SOLR 2 g  Inject 2 g into the vein daily.     ferrous sulfate  325 (65 FE) MG tablet  Take 1 tablet (325 mg total) by mouth 3 (three) times daily after meals.     gabapentin 300 MG capsule  Commonly known as:  NEURONTIN  Take 1 capsule (300 mg total) by mouth 3 (three) times daily.     ibuprofen 200 MG tablet  Commonly known as:  ADVIL,MOTRIN  Take 800 mg by mouth every 6 (six) hours as needed. pain     insulin glargine 100 UNIT/ML injection  Commonly known as:  LANTUS  Inject 0.4 mLs (40 Units total) into the skin daily.     metroNIDAZOLE 500 MG tablet  Commonly known as:  FLAGYL  Take 1 tablet (500 mg total) by mouth every 8 (eight) hours.     oxyCODONE-acetaminophen 5-325 MG per tablet  Commonly known as:  ROXICET  Take 1 tablet by mouth every 4 (four) hours as needed for pain.       Allergies  Allergen Reactions  . Zofran Itching and Nausea And Vomiting  . Doxycycline Nausea And Vomiting  . Morphine And Related Itching       Follow-up Information   Follow up with NITKA,JAMES E, MD In 1 week.   Specialty:  Orthopedic Surgery   Contact information:   [address] [place] [address] [phone]       Follow up with Eldred Manges, MD. Schedule an appointment as soon as possible for a visit in 2 weeks.   Specialty:  Orthopedic Surgery   Contact information:   [address] [place] [address] [phone]       Follow up with Acey Lav, MD. Schedule an appointment as soon as possible for a visit in 2 weeks.   Specialty:  Infectious Diseases   Contact information:   [address] [place] [address] [phone]        The results of significant diagnostics from this hospitalization (including imaging, microbiology, ancillary and laboratory) are listed below for reference.    Significant Diagnostic Studies: Ct Foot Left W Contrast  05/02/2013   CLINICAL DATA:  Persistent soft tissue swelling of the foot. Recent resection of soft tissue mass and of the base of  the 5th metatarsal. Previous incision and drainage of abscess.  EXAM: CT OF THE LEFT FOOT WITH CONTRAST  TECHNIQUE: Multidetector CT imaging was performed following the standard protocol during bolus administration of intravenous contrast.  CONTRAST:  OMNIPAQUE IOHEXOL 300 MG/ML  SOLN  COMPARISON:  CT scan dated 09/03/2012 and radiographs dated 04/30/2013  FINDINGS: There are multiple soft tissue abscesses in the midfoot including around the stump of the 4th metatarsal. There is also fluid in the joints between the bases of the 1st and 2nd, 2nd and 3rd and 3rd and 4th metatarsals which I think is pus. There is a pus in the soft tissues along the plantar aspect of the proximal metatarsals. There is also pus  in the plantar lateral aspect of the foot adjacent to the soft tissue resection. The area of involvement is approximately 4 cm in diameter.  There are new erosive changes of the basilar 2nd metatarsal since the CT scan of 09/03/2012 and there is increased irregularity of the bases of the 3rd and 4th metatarsals as well as of the medial and middle cuneiforms.  IMPRESSION: 1. Multiple soft tissue abscesses in around the tarsometatarsal joints. Findings consistent with septic joints between the proximal metatarsals. 2. New degenerative and erosive changes in several bones which could be arthritic but I suspect are more likely related to infection.   Electronically Signed   By: Geanie Cooley M.D.   On: 05/02/2013 11:14   Dg Foot Complete Left  04/30/2013   CLINICAL DATA:  Nonhealing wound  EXAM: LEFT FOOT - COMPLETE 3+ VIEW  COMPARISON:  09/02/2012  FINDINGS: The 4th and 5th metatarsals and digits have been amputated. The base of the 4th and 5th metatarsals remain intact, with diffuse sclerosis. There has been increasing erosion at the tips of these bones since the prior study. . There is also slight widening between the base of the 2nd metatarsal and medial cuneiform. Marked soft tissue swelling at the  amputation site is noted.  IMPRESSION: There is soft tissue swelling about the 4th and 5th metatarsal amputation site. Slightly increased erosion of the distal aspect of the remainder of the bone may represent developing osteomyelitis.   Electronically Signed   By: Maryclare Bean M.D.   On: 04/30/2013 18:06    Microbiology: Recent Results (from the past 240 hour(s))  MRSA PCR SCREENING     Status: None   Collection Time    05/04/13  8:18 AM      Result Value Range Status   MRSA by PCR NEGATIVE  NEGATIVE Final   Comment:            The GeneXpert MRSA Assay (FDA     approved for NASAL specimens     only), is one component of a     comprehensive MRSA colonization     surveillance program. It is not     intended to diagnose MRSA     infection nor to guide or     monitor treatment for     MRSA infections.     Labs: Basic Metabolic Panel:  Recent Labs Lab 05/08/13 0530 05/09/13 0435 05/10/13 0415 05/11/13 0415 05/12/13 0426  NA 134* 133* 133* 133* 133*  K 3.9 4.1 3.9 3.9 3.9  CL 102 101 101 100 101  CO2 22 22 22 23 23   GLUCOSE 199* 197* 179* 192* 197*  BUN 12 11 12 13 14   CREATININE 1.74* 1.72* 1.81* 1.82* 1.68*  CALCIUM 9.0 9.1 9.1 9.2 8.9  MG 1.7 1.6 1.7 1.6 1.6   Liver Function Tests:  Recent Labs Lab 05/06/13 0535 05/07/13 0425 05/08/13 0530 05/09/13 0435  AST 14 12 10 11   ALT 13 11 10 10   ALKPHOS 47 46 45 54  BILITOT 0.4 0.3 0.2* 0.3  PROT 6.7 6.6 6.9 7.2  ALBUMIN 2.4* 2.5* 2.6* 2.8*   No results found for this basename: LIPASE, AMYLASE,  in the last 168 hours No results found for this basename: AMMONIA,  in the last 168 hours CBC:  Recent Labs Lab 05/06/13 0535 05/07/13 0425 05/08/13 0530 05/09/13 0435 05/10/13 0415 05/11/13 0415 05/12/13 0426  WBC 7.6 6.4 6.7 6.9 6.2 6.9 6.2  NEUTROABS 4.6 3.7 4.0 4.5  --   --   --  HGB 9.9* 9.7* 9.4* 9.9* 9.4* 9.4* 9.2*  HCT 29.4* 28.7* 28.7* 29.6* 28.1* 27.9* 27.3*  MCV 87.2 87.2 87.5 87.6 87.3 87.7 87.2  PLT  314 286 285 335 302 328 301   Cardiac Enzymes: No results found for this basename: CKTOTAL, CKMB, CKMBINDEX, TROPONINI,  in the last 168 hours BNP: BNP (last 3 results) No results found for this basename: PROBNP,  in the last 8760 hours CBG:  Recent Labs Lab 05/11/13 1640 05/11/13 2003 05/12/13 0018 05/12/13 0403 05/12/13 0753  GLUCAP 193* 213* 166* 173* 177*       Signed:  Jonette Mate N  [facility] 05/12/2013, 11:02 AM

## 2013-05-12 NOTE — Progress Notes (Signed)
[facility] PROGRESS NOTE  Karen Bennett WUJ:[id number] DOB: Jun 25, 1976 DOA: 04/30/2013 PCP: No primary provider on file.  Assessment/Plan: 37 yo female diabetic not taking any meds due to lack of health insurance comes in with several days of worsening swelling and redness to left foot. Pt s/p toe amputation earlier this year with dr yates  1. Osteomyelitis fourth/fifth left metatarsal; - Dr. Otelia Sergeant and Dr. Lajoyce Corners evaluated patient regarding treatment options of transtibial amputation vs antibiotics with close monitoring for now. Patient decided to try and avoid amputation and will plan for antibiotics. Will need very close monitoring as an outpatient.  - antibiotics per ID recommendations; ceftriaxone+ metronidazole for 6 weeks, PICC line placed.  - Continue rounds of bedside hydrotherapy to left foot per surgery - placement to SNF pending. SW involved  2. Acute renal insufficiency; patient's creatinine is trending up, now stable. Likely multifactorial, antibiotics, infection in the setting of background with poorly controlled DM. Monitor.   3. IDDM - increased SSI to Resistant and increased  Lantus 40 unit QD, -Hemoglobin A1c 04/30/2013= 13 4. Hyponatremia; continue to monitor still slightly low but stable.  5. Hypokalemia; within normal limits this morning 6. Anemia - multifactorial with active infection, minimal blood loss in her wound vac. Stable.  7. HLD; -Lipid panel; not within ADA guidelines (HDL low, TG= 520), started on Lipitor 40 mg; will also  probably require niacin 8. Phantom Pain; continue Neurontin, increased to TID 10/18. Improving  9. Hypomagnesemia; within normal limits continue to monitor. Additional Mg 10/19 10. 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   Consultants:  Orthopedic surgery  Procedures: 05/01/2013  INCISION AND DRAINAGE LEFT  FOREFOOT ABCESS (Left) by Dr Kerrin Champagne (orthopedics)  Gram stain shows few gram-positive cocci 05/03/2013 Wound cultures; RARE GROUP B STREP(S.AGALACTIAE)ISOLATED (final 05/03/2013)  Antibiotics:  Zosyn 10/9>> stopped 10/14   Vancomycin 10/9>> stopped 10/14  Ceftriaxone 10/14>> stop date 11/18  Metronidazole 10/14 stop date 11/18  Subjective: - pain better this morning.  Objective: Filed Vitals:   05/11/13 0605 05/11/13 1444 05/11/13 2203 05/12/13 0624  BP: 134/81 148/74 179/82 143/84  Pulse: 66 79 81 74  Temp: 99.1 F (37.3 C) 99.4 F (37.4 C) 98.9 F (37.2 C) 98.7 F (37.1 C)  TempSrc: Oral Oral Oral Oral  Resp: 18 18 18 20   Height:      Weight:      SpO2: 96% 98% 100% 95%    Intake/Output Summary (Last 24 hours) at 05/12/13 1047 Last data filed at 05/11/13 1800  Gross per 24 hour  Intake 3013.33 ml  Output      0 ml  Net 3013.33 ml   Filed Weights   04/30/13 1856 04/30/13 2100  Weight: 163.295 kg (360 lb) 170.7 kg (376 lb 5.2 oz)    Exam:  General:  A./O. X4, NAD  Cardiovascular: Regular rhythm and rate, negative murmurs rubs gallops   Respiratory: Good auscultation  Abdomen: obese, nontender, plus bowel some  Musculoskeletal: Left foot wound VAC present over the remnants of fourth and fifth metatarsal. Negative warm to touch, swelling almost completely back to baseline     Data Reviewed: Basic Metabolic Panel:  Recent Labs Lab 05/08/13 0530 05/09/13 0435 05/10/13 0415 05/11/13 0415 05/12/13 0426  NA 134* 133* 133* 133* 133*  K 3.9 4.1 3.9 3.9 3.9  CL 102 101 101 100 101  CO2  22 22 22 23 23   GLUCOSE 199* 197* 179* 192* 197*  BUN 12 11 12 13 14   CREATININE 1.74* 1.72* 1.81* 1.82* 1.68*  CALCIUM 9.0 9.1 9.1 9.2 8.9  MG 1.7 1.6 1.7 1.6 1.6   Liver Function Tests:  Recent Labs Lab 05/06/13 0535 05/07/13 0425 05/08/13 0530 05/09/13 0435  AST 14 12 10 11   ALT 13 11 10 10   ALKPHOS 47 46 45 54  BILITOT 0.4 0.3 0.2* 0.3  PROT  6.7 6.6 6.9 7.2  ALBUMIN 2.4* 2.5* 2.6* 2.8*   CBC:  Recent Labs Lab 05/06/13 0535 05/07/13 0425 05/08/13 0530 05/09/13 0435 05/10/13 0415 05/11/13 0415 05/12/13 0426  WBC 7.6 6.4 6.7 6.9 6.2 6.9 6.2  NEUTROABS 4.6 3.7 4.0 4.5  --   --   --   HGB 9.9* 9.7* 9.4* 9.9* 9.4* 9.4* 9.2*  HCT 29.4* 28.7* 28.7* 29.6* 28.1* 27.9* 27.3*  MCV 87.2 87.2 87.5 87.6 87.3 87.7 87.2  PLT 314 286 285 335 302 328 301   CBG:  Recent Labs Lab 05/11/13 1640 05/11/13 2003 05/12/13 0018 05/12/13 0403 05/12/13 0753  GLUCAP 193* 213* 166* 173* 177*    Recent Results (from the past 240 hour(s))  MRSA PCR SCREENING     Status: None   Collection Time    05/04/13  8:18 AM      Result Value Range Status   MRSA by PCR NEGATIVE  NEGATIVE Final   Comment:            The GeneXpert MRSA Assay (FDA     approved for NASAL specimens     only), is one component of a     comprehensive MRSA colonization     surveillance program. It is not     intended to diagnose MRSA     infection nor to guide or     monitor treatment for     MRSA infections.     Studies: No results found.  Scheduled Meds: . atorvastatin  40 mg Oral q1800  . cefTRIAXone (ROCEPHIN)  IV  2 g Intravenous Q24H  . enoxaparin (LOVENOX) injection  85 mg Subcutaneous Q24H  . ferrous sulfate  325 mg Oral TID PC  . gabapentin  300 mg Oral TID  . insulin aspart  0-20 Units Subcutaneous Q4H  . insulin glargine  40 Units Subcutaneous Daily  . magnesium sulfate 1 - 4 g bolus IVPB  2 g Intravenous Once  . metroNIDAZOLE  500 mg Oral Q8H   Continuous Infusions: . sodium chloride 50 mL/hr at 05/11/13 2022   Principal Problem:   Diabetic foot ulcer Active Problems:   DM (diabetes mellitus), type 2, uncontrolled with complications   Obesity, Class III, BMI 40-49.9 (morbid obesity)   Hyponatremia   Osteomyelitis   Cellulitis and abscess of leg, except foot   Diabetic peripheral neuropathy associated with type 2 diabetes mellitus    Hypokalemia   HLD (hyperlipidemia)   Hypomagnesemia   Phantom limb pain   Acute renal insufficiency  Time spent: 15 minutes  Esperanza Sheets  [facility] Pager [phone]. If 7PM-7AM, please contact night-coverage at [email or web address], password [facility] 05/12/2013, 10:47 AM  LOS: 12 days

## 2013-05-12 NOTE — Progress Notes (Signed)
Spoke with nurse at [facility] center, she asked me to turn off the wound vac and clamp the line prior to d/c. Jacki Cones McNichols , Wound nurse was called to ask if that was exceptable, she stated yes ,but the wound vac needed to be restarted within 2 hours. The nurse was aware of the time limit and agreed.

## 2013-05-12 NOTE — Progress Notes (Signed)
Clinical Social Work  CSW received call from [facility] who reports they will accept patient today. CSW faxed DC summary and confirmed it was received. [facility] agreeable to accept patient today. CSW informed patient, RN and MD of DC plans. Patient reports she wanted a SNF closer to home. CSW explained the barriers and patient agreeable to DC plans.  CSW prepared DC packet with FL2 and hard scripts included. CSW coordinated transportation via [facility]. Request [phone].  CSW is signing off but available if needed.  [place], [place] [phone]

## 2013-05-12 NOTE — Consult Note (Signed)
WOC wound consult note Reason for Consult:Patient seen today for placement of Wound V.A.C. Following hydrotherapy treatment. Wound type:Neuropathic ulcer Pressure Ulcer POA: No Measurement:See measurements from Saturday Wound ZOX:WRUEA, red granulation tissue with 10% yellow slough.  Small "divit" at 11 o'clock measuring 1.5cm x .5cm x 1.0cm Drainage (amount, consistency, odor) Moderate amount serosanguinous exudate in cannister Periwound:intact, mild maceration in dependent position.  This tissue is protected today with silicone dressing Mepitel. Dressing procedure/placement/frequency: Recommend NPWT to continue three times per week at SNF on M-W-F, with or without pulsatile lavage. Plan is for patient to transfer today. Dr. Otelia Sergeant in to see patient at end of treatment today and agrees with POC. WOC nursing team will not follow, but will remain available to this patient, the nursing and medical team.  Please re-consult if needed. Thanks, Ladona Mow, MSN, RN, GNP, [place], CWON-AP [phone])

## 2013-05-12 NOTE — Progress Notes (Signed)
Patient ID: Karen Bennett, female   DOB: 11/24/1975, 37 y.o.   MRN: [id number] Subjective: 11 Days Post-Op Procedure(s) (LRB): INCISION AND DRAINAGE LEFT FOREFOOT ABCESS  (Left) APPLICATION OF WOUND VAC (Left) Awake, alert and Oriented x 4. Wound care and osteomy care team has just completed dressing changes and VAC. They report 90%  Granulation 10% necrotic tissue, improving from 70/30 of last week. She is scheduled to go to [facility], dressing care and antibiotic treatment, IV.  Patient reports pain as mild.    Objective:   VITALS:  Temp:  [98.7 F (37.1 C)-99.4 F (37.4 C)] 98.7 F (37.1 C) (10/20 0624) Pulse Rate:  [74-81] 74 (10/20 0624) Resp:  [18-20] 20 (10/20 0624) BP: (143-179)/(74-84) 143/84 mmHg (10/20 0624) SpO2:  [95 %-100 %] 95 % (10/20 0624)  ABD soft Sensation intact distally Intact pulses distally Dorsiflexion/Plantar flexion intact Compartment soft decreased swelling no induration or purulence.   LABS  Recent Labs  05/10/13 0415 05/11/13 0415 05/12/13 0426  HGB 9.4* 9.4* 9.2*  WBC 6.2 6.9 6.2  PLT 302 328 301    Recent Labs  05/11/13 0415 05/12/13 0426  NA 133* 133*  K 3.9 3.9  CL 100 101  CO2 23 23  BUN 13 14  CREATININE 1.82* 1.68*  GLUCOSE 192* 197*   No results found for this basename: LABPT, INR,  in the last 72 hours   Assessment/Plan: 11 Days Post-Op Procedure(s) (LRB): INCISION AND DRAINAGE LEFT FOREFOOT ABCESS  (Left) APPLICATION OF WOUND VAC (Left)  Continue ABX therapy due to infection Discharge to SNF Will follow up with me in my office in 9 days, Wed. 05/21/2013. VAC changes and water PIC MWF.  NITKA,JAMES E 05/12/2013, 12:24 PM

## 2013-05-12 NOTE — Progress Notes (Signed)
Clinical Social Work  CSW continues to work on placement. CSW called and left a message at [facility] in order to inquire if they have determined if they can accept patient. CSW will continue to follow.  [place], [place] [phone]

## 2013-05-12 NOTE — Progress Notes (Signed)
05/12/13 1110  1110-1216  Subjective Assessment  Subjective I hope it looks better.  Date of Onset 04/21/13  Evaluation and Treatment  Evaluation and Treatment Procedures Explained to Patient/Family Yes  Evaluation and Treatment Procedures agreed to  Wound 05/05/13 Diabetic ulcer Foot Left L lateral foot I & D   Date First Assessed/Time First Assessed: 05/05/13 1030   Wound Type: Diabetic ulcer  Location: Foot  Location Orientation: Left  Wound Description (Comments): L lateral foot I & D   Present on Admission: Yes  Site / Wound Assessment Red;Granulation tissue;Yellow (still 2x2 area on periwound around 11:00)  % Wound base Red or Granulating 90%  % Wound base Yellow 10%  % Wound base Black 0%  Peri-wound Assessment Maceration;Edema  Wound Length (cm) 10 cm  Wound Width (cm) 8.5 cm  Wound Depth (cm) 2.5 cm  Margins Unattacted edges (unapproximated)  Closure None  Drainage Amount Minimal (in VAC)  Drainage Description Serosanguineous  Non-staged Wound Description Full thickness  Dressing Type (VAC placed after by nursing)  Dressing Status Dry;Clean;Intact  Hydrotherapy  Pulsed Lavage with Suction (psi) 4 psi (varied 8-12)  Pulsed Lavage with Suction - Normal Saline Used 1000 mL  Pulsed Lavage Tip Tip with splash shield  Pulsed lavage therapy - wound location L lateral foot  Selective Debridement  Selective Debridement - Location L Lateral foot 9:00-12:00 location  Selective Debridement - Tools Used Forceps;Scissors  Selective Debridement - Tissue Removed yellow necrotic tissue  Wound Therapy - Assess/Plan/Recommendations  Wound Therapy - Clinical Statement Pt has a .5x.5x.5 hole at 1100 which does not appear to tunnel. Pt has bed at SNF. Plan to decreased to MWF.   Wound Therapy - Functional Problem List Chronic diabetic wound  Factors Delaying/Impairing Wound Healing Diabetes Mellitus  Hydrotherapy Plan Debridement;Patient/family education;Pulsatile lavage with suction   Wound Therapy - Frequency 6X / week  Wound Therapy - Current Recommendations Case manager/social work;Diabetic teaching;Nutritionist  Wound Plan to continue to provide pulsed lavage with debridement once less than 5% , will decrease frequency to MWF and probable ?DC PLS .Marland Kitchen   Wound Therapy Goals - Improve the function of patient's integumentary system by progressing the wound(s) through the phases of wound healing by:  Decrease Necrotic Tissue to less than 5%  Decrease Necrotic Tissue - Progress Progressing toward goal  Increase Granulation Tissue to 95% or greater  Blanchard Kelch PT [phone]

## 2013-05-13 ENCOUNTER — Other Ambulatory Visit: Payer: Self-pay | Admitting: *Deleted

## 2013-05-13 LAB — GLUCOSE, CAPILLARY
Glucose-Capillary: 182 mg/dL — ABNORMAL HIGH (ref 70–99)
Glucose-Capillary: 182 mg/dL — ABNORMAL HIGH (ref 70–99)
Glucose-Capillary: 195 mg/dL — ABNORMAL HIGH (ref 70–99)
Glucose-Capillary: 181 mg/dL — ABNORMAL HIGH (ref 70–99)
Glucose-Capillary: 202 mg/dL — ABNORMAL HIGH (ref 70–99)

## 2013-05-13 MED ORDER — ALPRAZOLAM 0.25 MG PO TABS: ORAL_TABLET | ORAL | Status: DC

## 2013-05-14 ENCOUNTER — Ambulatory Visit (HOSPITAL_COMMUNITY)
Admit: 2013-05-14 | Discharge: 2013-05-14 | Disposition: A | Discharge status: Home / Self Care | Payer: Medicaid Other | Source: Ambulatory Visit | Attending: Internal Medicine | Admitting: Internal Medicine

## 2013-05-14 ENCOUNTER — Non-Acute Institutional Stay (INDEPENDENT_AMBULATORY_CARE_PROVIDER_SITE_OTHER): Payer: Self-pay | Admitting: Internal Medicine

## 2013-05-14 DIAGNOSIS — L02419 Cutaneous abscess of limb, unspecified: Secondary | ICD-10-CM | POA: Insufficient documentation

## 2013-05-14 DIAGNOSIS — E1149 Type 2 diabetes mellitus with other diabetic neurological complication: Secondary | ICD-10-CM

## 2013-05-14 DIAGNOSIS — M869 Osteomyelitis, unspecified: Secondary | ICD-10-CM

## 2013-05-14 DIAGNOSIS — Z794 Long term (current) use of insulin: Secondary | ICD-10-CM | POA: Insufficient documentation

## 2013-05-14 DIAGNOSIS — M9080 Osteopathy in diseases classified elsewhere, unspecified site: Secondary | ICD-10-CM

## 2013-05-14 DIAGNOSIS — E1169 Type 2 diabetes mellitus with other specified complication: Secondary | ICD-10-CM

## 2013-05-14 DIAGNOSIS — IMO0001 Reserved for inherently not codable concepts without codable children: Secondary | ICD-10-CM | POA: Insufficient documentation

## 2013-05-14 DIAGNOSIS — E1142 Type 2 diabetes mellitus with diabetic polyneuropathy: Secondary | ICD-10-CM | POA: Insufficient documentation

## 2013-05-14 LAB — GLUCOSE, CAPILLARY
Glucose-Capillary: 178 mg/dL — ABNORMAL HIGH (ref 70–99)
Glucose-Capillary: 182 mg/dL — ABNORMAL HIGH (ref 70–99)

## 2013-05-14 NOTE — Progress Notes (Signed)
Patient ID: Karen Bennett, female   DOB: 1975-12-14, 37 y.o.   MRN: [id number]  Facility; [facility] Chief complaint; admission to SNF post [facility] 8 to October 21   History: This is a soon to be 37 year old woman who has type 2 diabetes for the last 2 years. She has not been taking anything for diabetes since she is uninsured and unable to afford medications.m she apparently had amputation of her left fourth and fifth toes in 2010 developed an abscess and underwent surgery in March of this year. Since then I believe she has been dealing with an open area. She was admitted to hospital and eventually decisions were made to try and manage this medically. There was some suggestion of considering a below-knee amputation however according to the discharge summary the patient elected for medical therapy. The patient lives alone she could not manage this in her own environment and she has been admitted to your for wound care, completion of her antibiotics. She was seen by infectious disease and recommended for ceftriaxone and Flagyl for 6 weeks she has a PICC line in place. She is getting up pulse lavage to the left foot wound as well as a wound VAC. Other issues in the hospital including insulin-dependent diabetes mellitus now on Lantus 40 units daily her hemoglobin A1c was 13. Acute renal insufficiency, hyponatremia, hypokalemia, anemia and hypomagnesemia. A bone culture showed few group B strep this was done from the left metatarsal with the patient on vancomycin and so was there were no anaerobes. A previous hospitalizations apparently resulted in bacteremia.    has a past medical history of Cellulitis; Diabetes mellitus; and PE (pulmonary embolism) (~2007-2008).  Past Surgical History  Procedure Laterality Date  . Toe amputation      last 2 on L foot  . Surgery to remove hematoma in l leg    . Abscess removal from l groin    . I&d extremity  11/12/2011    Procedure: IRRIGATION  AND DEBRIDEMENT EXTREMITY;  Surgeon: Kathryne Hitch, MD;  Location: [facility];  Service: Orthopedics;  Laterality: Left;  foot left  . I&d extremity Left 09/06/2012    Procedure: IRRIGATION AND DEBRIDEMENT EXTREMITY;  Surgeon: Eldred Manges, MD;  Location: [facility];  Service: Orthopedics;  Laterality: Left;  . I&d extremity Left 05/01/2013    Procedure: INCISION AND DRAINAGE LEFT FOREFOOT ABCESS ;  Surgeon: Kerrin Champagne, MD;  Location: [facility];  Service: Orthopedics;  Laterality: Left;  . Application of wound vac Left 05/01/2013    Procedure: APPLICATION OF WOUND VAC;  Surgeon: Kerrin Champagne, MD;  Location: [facility];  Service: Orthopedics;  Laterality: Left;   Current medications; Lipitor 40 mg daily, ferrous sulfate 325 3 times a day, Neurontin 303 times daily, Advil thought 800 every 6 hours when necessary, Lantus insulin 40 units daily, Flagyl 500 every 8, Roxicet one tablet every 4 hours when necessary   Social; patient lives on her on in a trailer somewhere [place] out of side of [place]. He is not currently employed. She is according to her been denied for disability. She has no resources to afford things like Lantus insulin. Nonsmoker. No recreational drug history  Family History  Problem Relation Age of Onset  . Diabetes Mother   . Coronary artery disease Mother    Review of systems Respiratory no respiratory issues Cardiac no exertional chest pain GI no abdominal pain  Physical examination Cardiac heart sounds  are normal there is a benign sounding midsystolic murmur Abdomen morbidly obese but no masses are palpable. Extremities lymphedema but no pitting edema is noted. Peripheral pulses are palpable appear Left foot; there is substantial swelling from the mid foot laterally. There is an extensive wound measuring 9 x 9.5 x 1. The 1 cm depth is actually a small open area in the mid aspect which still probes to bone. Otherwise the granulation of the substantial wound  appears to be fairly healthy. She has pain swelling and tenderness in the wide area around this wound Neurologic she does not have any reflexes however she has antigravity strength  Impression/plan #1 extensive left foot wound as described the. A CT scan of the foot on October 10 showed multiple soft tissue abscesses in the midfoot and around the stump of the fourth metatarsal. There is also fluid in the joint between the bases of the first and second second and third third and fourth metatarsals which is suggested radiographically to be possible. Renaee Munda was felt to be multiple soft tissue abscesses in the tarsal metatarsal joints. Findings consistent with septic joint between the proximal metatarsals. New degenerative and erosive changes and several bones felt to be secondary to infection. Think the chances that this woman will heal this successfully with medical therapy including antibiotics, aggressive wound care, continue with offloading are virtually 0. I think she would be helped by hyperbaric oxygen in this setting however this is not feasible. For this reason I talked to the patient about a below-knee amputation which would probably present its own problems, whether she could be fitted with a prosthesis postoperatively and on I am uncertain. Nevertheless I think an amputation would probably be a better option for this patient #2 diabetic neuropathy on insulin. I'll see what her CBGs are in adjust this accordingly. It is unlikely she would be able to afford Lantus at home however all continue this for now. #3 need for almost complete offloading of this wound. I discussed this with the patient she has a healing sandal although I am doubtful this is enough . This would create many difficulties for this woman if she were to try did return to her home environment and I spent some time discussing this with her #4 I think this woman certainly qualifies for disability her, she is not able to bear weight on  this foot as long as there is this large defect on the lateral aspect. We will attempt to work on this while she is here which will be for at least 6 weeks while she is on IV Rocephin.

## 2013-05-15 ENCOUNTER — Non-Acute Institutional Stay (SKILLED_NURSING_FACILITY): Payer: Self-pay | Admitting: Internal Medicine

## 2013-05-15 ENCOUNTER — Ambulatory Visit (HOSPITAL_COMMUNITY): Payer: Self-pay | Admitting: Physical Therapy

## 2013-05-15 DIAGNOSIS — M25579 Pain in unspecified ankle and joints of unspecified foot: Secondary | ICD-10-CM

## 2013-05-15 DIAGNOSIS — M25572 Pain in left ankle and joints of left foot: Secondary | ICD-10-CM

## 2013-05-15 DIAGNOSIS — R609 Edema, unspecified: Secondary | ICD-10-CM

## 2013-05-15 LAB — GLUCOSE, CAPILLARY
Glucose-Capillary: 212 mg/dL — ABNORMAL HIGH (ref 70–99)
Glucose-Capillary: 227 mg/dL — ABNORMAL HIGH (ref 70–99)
Glucose-Capillary: 206 mg/dL — ABNORMAL HIGH (ref 70–99)
Glucose-Capillary: 208 mg/dL — ABNORMAL HIGH (ref 70–99)

## 2013-05-15 NOTE — Progress Notes (Signed)
Physical Therapy Wound Evaluation  Patient Details  Name: Karen Bennett MRN: [id number] Date of Birth: [birth year]  Today's Date: 05/14/2013 Time: 1355-1430 PT Time Calculation (min): 35 min Charges: 1 evaluation  Deb < 20cm            Visit#: 1 of 12  Re-eval: 06/14/13   Past Medical History:  Past Medical History  Diagnosis Date  . Cellulitis   . Diabetes mellitus   . PE (pulmonary embolism) ~2007-2008    Not on anticoagulation   Past Surgical History:  Past Surgical History  Procedure Laterality Date  . Toe amputation      last 2 on L foot  . Surgery to remove hematoma in l leg    . Abscess removal from l groin    . I&d extremity  11/12/2011    Procedure: IRRIGATION AND DEBRIDEMENT EXTREMITY;  Surgeon: Kathryne Hitch, MD;  Location: [facility];  Service: Orthopedics;  Laterality: Left;  foot left  . I&d extremity Left 09/06/2012    Procedure: IRRIGATION AND DEBRIDEMENT EXTREMITY;  Surgeon: Eldred Manges, MD;  Location: [facility];  Service: Orthopedics;  Laterality: Left;  . I&d extremity Left 05/01/2013    Procedure: INCISION AND DRAINAGE LEFT FOREFOOT ABCESS ;  Surgeon: Kerrin Champagne, MD;  Location: [facility];  Service: Orthopedics;  Laterality: Left;  . Application of wound vac Left 05/01/2013    Procedure: APPLICATION OF WOUND VAC;  Surgeon: Kerrin Champagne, MD;  Location: [facility];  Service: Orthopedics;  Laterality: Left;   Referring physician/Next Apt: Dr. Leanord Hawking Medical Diagnosis: Osteomyelitis of Lt foot w/Incsion and Drainage (I&D) on 05/01/13  Previous Therapy: Hydrotherapy and wound vac at [facility] Current wound care treatment: Wound Vac and care at [facility]   Subjective: Pt is a 37 year old female referred to PT for hydrotherapy wound care. She has type 2 diabetes for the last 2 years. 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 Since then she has been dealing with an open area w/osteomyelitis. She was admitted to [facility] hospital on 04/30/13 and d/c on 05/13/13 to [facility] wound care. The patient lives alone she could not manage this in her own environment and she has been admitted to your for wound care, completion of her antibiotics. She was seen by infectious disease and recommended for ceftriaxone and Flagyl for 6 weeks she has a PICC line in place. She is getting up pulse lavage to the left foot wound as well as a wound VAC. Other issues in the hospital including insulin-dependent diabetes mellitus now on Lantus 40 units daily her hemoglobin A1c was 13. Acute renal insufficiency, hyponatremia, hypokalemia, anemia and hypomagnesemia. A bone culture showed few group B strep this was done from the left metatarsal with the patient on vancomycin and so was there were no anaerobes. A previous hospitalizations apparently resulted in bacteremia.   When did it begin: 2 years  Is patient agreeable to wound care? yes   Pain:  0/10 Objective:   Wound 1:   Location: Lt lateral aspect of foot.  Length: 8.5 cm Width: 9 cm Depth: 3.5 cm Tunneling: 0 Granulation:90%  Slough: 10% Drainage (amount/description): moderate Periwound: Edematous Other:   Wound 2: Location: Lt lateral aspect of foot.  Length: 2 cm Width: 1.5 cm Depth: unstageable  Tunneling: unstageable Granulation: 0% Slough: 100% Drainage (amount/description): moderate Periwound: Edematous   Dressing Used: Aquacell, Kerlex gauze  Sharps Used/Location: Forceps and Scapel Tissue Remove: slough Pulse-lavage with tip and splashgaurd:   PSI: 500  Saline: 1000 ml  Clinical  Impressions: Pt is a 37 year old female referred to OP PT for wound care with impairments listed below.  Pt will benefit from skilled OP PT wound care to address current impairments listed below: Increased necrotic tissue Decreased Granulation  Tissue Altered Sensation Edema Decreased Mobility Decreased knowledge of wound care Inability to participate in wound care  PT. Plan: Selective sharps debridement, Dressing Change, Pulse-Lavage, Electrical-Stimulation, LASER therapy, Pt/family Education  PT Assessment and Plan PT Frequency: Min 3X/week PT Duration: 4 weeks PT Plan: Hydrotherapy to Lt foot abscess.  Approrpriate dressings as needed.     PT Prognosis Wound Therapy - Potential for Goals: Excellent Good Fair Poor  Goals: Wound Therapy Goals - Improve the function of patient's integumentary system by progressing the wound(s) through the phases of wound healing by:  Decrease Necrotic Tissue to: 0% to continue with wound vac Decrease Necrotic Tissue - Progress: Goal set today  Increase Granulation Tissue to: 100%  To continue with wound vac  Increase Granulation Tissue - Progress: Goal set today  Patient/Family will be able to : Skilled nursing to perform wound care Patient/Family Instruction Goal - Progress: Goal set today   Problem List Patient Active Problem List   Diagnosis Date Noted  . Acute renal insufficiency 05/07/2013  . Phantom limb pain 05/06/2013  . Hypomagnesemia 05/04/2013  . HLD (hyperlipidemia) 05/02/2013  . Hypokalemia 05/01/2013  . Cellulitis and abscess of leg, except foot 04/30/2013    Class: Acute  . Diabetic peripheral neuropathy associated with type 2 diabetes mellitus 04/30/2013    Class: Chronic  . MSSA (methicillin susceptible Staphylococcus aureus) 09/06/2012  . Bacteremia 09/04/2012  . Osteomyelitis 09/03/2012  . Leukocytosis 11/11/2011  . Pulmonary emboli 11/11/2011  . Hyponatremia 09/05/2011  . Diabetic foot ulcer 09/05/2011  . Cellulitis 08/15/2011  . Obesity, Class III, BMI 40-49.9 (morbid obesity) 08/15/2011  . DM (diabetes mellitus), type 2, uncontrolled with complications 06/12/2007    PT Plan of Care Consulted and Agree with Plan of Care: Patient  Annett Fabian, MPT,  ATC 05/15/2013, 3:49 PM  Physician Documentation Your signature is required to indicate approval of the treatment plan as stated above.  Please sign and either send electronically or make a copy of this report for your files and return this physician signed original.   Please mark one 1.__approve of plan  2. ___approve of plan with the following conditions.   ______________________________                                                          _____________________ Physician Signature  Date  

## 2013-05-15 NOTE — Progress Notes (Signed)
Patient ID: Karen Bennett, female   DOB: 1975-09-24, 37 y.o.   MRN: [id number]  This is an acute visit.  Level of care skilled.  Facility [facility].  Chief complaint-acute visit secondary to pain management---.  History of present illness.  Patient is a very pleasant 37 year old female who is here for 6 weeks of long-term antibiotics secondary to a significant left foot wound..  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  There's been subsequently an open area.  There was discussion apparently of a below the knee amputation however she has elected apparently for medical therapy although this could be a challenge.  She currently is on Rocephin and Flagyl for 6 weeks-she also has a wound VAC in place.  .  She is receiving Percocet 11/24/2023 every 4 hours when necessary for pain but she says this helps some but is not lasting long enough.  Her vital signs continued to be stable she denies any fever chills appears to be stable otherwise.  Family medical social history has been reviewed per progress note on 05/14/2013.  Medications have been reviewed per MAR.  Review of systems.  General denies any fever or chills.  Respirate no complaints of cough or shortness of breath.  Cardiac did not complain of chest pain.  GI-no complaints of nausea vomiting diarrhea or constipation.  Muscle skeletal-as noted above is complaining of significant pain.  Neurologic does not complaining of any headache or dizziness does have a history of neuropathy.  Extremities-does complain of some pedal edema  Bilaterally- apparently this has been occurring more in the last few days  Psych does not complaining of depression or anxiety very pleasant individual.  Physical exam.  T--. 97.8 pulse 67 respirations 20 blood pressure 145/81  In general this is a very pleasant young female in no distress lying comfortably in  bed.  Her skin is warm and dry.  Chest is clear to auscultation without any labored breathing.  Heart is regular rate and rhythm without murmur gallop or rub.  Extremities she does have lymphedema bilaterally.  Left foot wound has a wound VAC applied currently there is some edema of her feet bilaterally she states this has increased somewhat the past couple days.  She continues to have some pain around the wound when palpated.  Labs 05/12/2013.  WBC 6.2 hemoglobin 9.2 platelets 301.  Sodium 133 potassium 3.9 BUN 14 creatinine 1.68.  Assessment and plan.  #1-pain management-discussed this with patient she does have a morphine allergy which causes a rash and itching.  Percocet apparently is not totally effective although it helped some-looks like she would've probably benefit from more long-term pain control will discontinue the Percocet and start OxyContin controlled release 10 mg twice a day routine and then OxyIR every 4 hours when necessary for breakthrough pain.  #2-edema-continue to monitor her weights apparently have been stable in the facility encouraged leg elevation when possible physical exam was fairly benign no signs of respiratory compromise-she does have some history of lymphedema and apparently this is not much changed from yesterday.  [id number]    .    --.

## 2013-05-16 ENCOUNTER — Ambulatory Visit (HOSPITAL_COMMUNITY)
Admit: 2013-05-16 | Discharge: 2013-05-16 | Disposition: A | Discharge status: Home / Self Care | Payer: Medicaid Other | Attending: Internal Medicine | Admitting: Internal Medicine

## 2013-05-16 DIAGNOSIS — L02419 Cutaneous abscess of limb, unspecified: Secondary | ICD-10-CM

## 2013-05-16 LAB — GLUCOSE, CAPILLARY: Glucose-Capillary: 160 mg/dL — ABNORMAL HIGH (ref 70–99)

## 2013-05-16 NOTE — Progress Notes (Addendum)
Physical Therapy Wound Care Treatment  Visit # 2/02 Time 1355-1430 Re-assessment due 06/14/2013  Medical Diagnosis: foot absess     Subjective: Pt stated pain in Lt foot wound stays 6/10 with pain meds  Pain: 6/10 Lt foot  Objective:  Wound 1:  Location: Lt lateral aspect of foot.  Length: 8.5 cm  Width: 9 cm  Depth: 3.5 cm  Tunneling: 0  Granulation:90%  Slough: 10%  Drainage (amount/description): moderate  Periwound: Edematous  Other:   Wound 2:  Location: Lt lateral aspect of foot.  Length: 2 cm  Width: 1.5 cm  Depth: unstageable  Tunneling: unstageable  Granulation: 0%  Slough: 100%  Drainage (amount/description): moderate  Periwound: Edematous  Dressing Used: Aquacell, Kerlex gauze  Sharps Used/Location: Forceps and Scapel  Tissue Remove: slough  Pulse-lavage with tip and splashgaurd:  PSI: 500  Saline: 1000 ml   Clinical  Impressions Statement: PLS followed by debridement to remove yellow slough and necrotic tissues to promote healing.  Noted increased maceration distal wound, applied vaseline periwound to protect skin integrity.  Used aquacell to promote healing and gauze dressings.  Wound VAC will be applied by nursing later today.  Pain scale stays 6/10 at end of session.    Plan:  to continue to provide pulsed lavage with debridement once less than 5% , may need to decrease frequency in order to not be disruptive to granualating tissue in VAC placment and decrease to 3x /week or none once wound bed imporves.

## 2013-05-19 ENCOUNTER — Ambulatory Visit (HOSPITAL_COMMUNITY)
Admit: 2013-05-19 | Discharge: 2013-05-19 | Disposition: A | Discharge status: Home / Self Care | Payer: Medicaid Other | Attending: Internal Medicine | Admitting: Internal Medicine

## 2013-05-19 LAB — GLUCOSE, CAPILLARY
Glucose-Capillary: 130 mg/dL — ABNORMAL HIGH (ref 70–99)
Glucose-Capillary: 175 mg/dL — ABNORMAL HIGH (ref 70–99)
Glucose-Capillary: 238 mg/dL — ABNORMAL HIGH (ref 70–99)
Glucose-Capillary: 212 mg/dL — ABNORMAL HIGH (ref 70–99)
Glucose-Capillary: 160 mg/dL — ABNORMAL HIGH (ref 70–99)
Glucose-Capillary: 190 mg/dL — ABNORMAL HIGH (ref 70–99)
Glucose-Capillary: 201 mg/dL — ABNORMAL HIGH (ref 70–99)
Glucose-Capillary: 186 mg/dL — ABNORMAL HIGH (ref 70–99)
Glucose-Capillary: 207 mg/dL — ABNORMAL HIGH (ref 70–99)

## 2013-05-19 NOTE — Progress Notes (Signed)
Physical Therapy Treatment Patient Details  Name: Karen Bennett MRN: [id number] Date of Birth: 1976-04-16  Today's Date: 05/19/2013 Time: 1525-1600 PT Time Calculation (min): 35 min Charges: Selective debridement (= or < 20 cm); Diverter tip   Visit#: 3 of 12  Re-eval: 06/14/13   Subjective: Symptoms/Limitations Symptoms: Pt states that she is always in pain. Pain Assessment Currently in Pain?: Yes Pain Score: 7  Pain Location: Foot Pain Orientation: Left  Objective:  Location: Lt lateral aspect of foot.  Length: 8.5 cm  Width: 9 cm  Depth: 3.5 cm  Tunneling: 0  Granulation:90%  Slough: 10%  Drainage (amount/description): moderate  Periwound: Edematous  Other:  Wound 2:  Location: Lt lateral aspect of foot.  Length: 2 cm  Width: 1.5 cm  Depth: unstageable  Tunneling: unstageable  Granulation: 0%  Slough: 100%  Drainage (amount/description): moderate  Periwound: Edematous  Dressing Used: Saline soaked 4x4, ABD Kerlex gauze  Tools Used For Debridement/Location: Forceps, Q-tip, 4x4/ entire wound  Tissue Remove: slough  Pulse-lavage with tip and splashgaurd:  PSI: 500  Saline: 1000 ml  Clinical Impressions Statement: Pulse lavage completed to cleanse and debride wound. Small area posterior to large wound not debrided as pt states that wound nurse from SNF requested that it be left alone. Wound is clean and without signs or sx of infection. Wound was dressed with saline soaked 4x4 as wound vac will be applied once pt returns to SNF. Plan:  to continue to provide pulsed lavage with debridement once less than 5% , may need to decrease frequency in order to not be disruptive to granulating tissue in VAC placement and decrease to 3x /week or none once wound bed improves.   Seth Bake, PTA 05/19/2013, 5:59 PM

## 2013-05-20 LAB — GLUCOSE, CAPILLARY
Glucose-Capillary: 164 mg/dL — ABNORMAL HIGH (ref 70–99)
Glucose-Capillary: 180 mg/dL — ABNORMAL HIGH (ref 70–99)
Glucose-Capillary: 141 mg/dL — ABNORMAL HIGH (ref 70–99)
Glucose-Capillary: 236 mg/dL — ABNORMAL HIGH (ref 70–99)

## 2013-05-21 ENCOUNTER — Other Ambulatory Visit: Payer: Self-pay | Admitting: *Deleted

## 2013-05-21 ENCOUNTER — Inpatient Hospital Stay (HOSPITAL_COMMUNITY)
Admit: 2013-05-21 | Discharge: 2013-05-21 | Disposition: A | Discharge status: Home / Self Care | Payer: Self-pay | Attending: *Deleted | Admitting: *Deleted

## 2013-05-21 LAB — GLUCOSE, CAPILLARY: Glucose-Capillary: 162 mg/dL — ABNORMAL HIGH (ref 70–99)

## 2013-05-21 MED ORDER — OXYCODONE-ACETAMINOPHEN 5-325 MG PO TABS: 1.0000 | ORAL_TABLET | ORAL | Status: DC | PRN

## 2013-05-21 NOTE — Progress Notes (Signed)
  Patient Details  Name: Karen Bennett MRN: [id number] Date of Birth: 01/23/1976  Today's Date: 05/21/2013 Time: 3664-4034  Spoke with Dr. Vira Browns Nurse Herbert Seta) who gave verbal order to d/c hydrotherapy and physical therapy.  Pt to continue with nursing care and wound vac. Asked the nurse to call [facility] [phone]) and speak with wound care nurse Clydie Braun to d/c pt from PT service.   Marcelia Petersen, MPT, ATC  05/21/2013, 2:45 PM

## 2013-05-22 ENCOUNTER — Ambulatory Visit (INDEPENDENT_AMBULATORY_CARE_PROVIDER_SITE_OTHER): Payer: Medicaid Other | Admitting: Internal Medicine

## 2013-05-22 ENCOUNTER — Encounter: Payer: Self-pay | Admitting: Internal Medicine

## 2013-05-22 VITALS — BP 144/80 | HR 63 | Temp 98.2°F

## 2013-05-22 DIAGNOSIS — D649 Anemia, unspecified: Secondary | ICD-10-CM

## 2013-05-22 DIAGNOSIS — F329 Major depressive disorder, single episode, unspecified: Secondary | ICD-10-CM

## 2013-05-22 DIAGNOSIS — M869 Osteomyelitis, unspecified: Secondary | ICD-10-CM

## 2013-05-22 DIAGNOSIS — F4321 Adjustment disorder with depressed mood: Secondary | ICD-10-CM | POA: Insufficient documentation

## 2013-05-22 DIAGNOSIS — F3289 Other specified depressive episodes: Secondary | ICD-10-CM

## 2013-05-22 DIAGNOSIS — F4320 Adjustment disorder, unspecified: Secondary | ICD-10-CM | POA: Insufficient documentation

## 2013-05-22 LAB — CBC
HCT: 31.4 % — ABNORMAL LOW (ref 36.0–46.0)
MCH: 29.2 pg (ref 26.0–34.0)
MCHC: 34.1 g/dL (ref 30.0–36.0)
MCV: 85.8 fL (ref 78.0–100.0)
Platelets: 363 10*3/uL (ref 150–400)
RBC: 3.66 MIL/uL — ABNORMAL LOW (ref 3.87–5.11)
RDW: 13.9 % (ref 11.5–15.5)
WBC: 7 10*3/uL (ref 4.0–10.5)

## 2013-05-22 LAB — GLUCOSE, CAPILLARY: Glucose-Capillary: 214 mg/dL — ABNORMAL HIGH (ref 70–99)

## 2013-05-22 NOTE — Progress Notes (Signed)
Patient ID: Karen Bennett, female   DOB: 02/17/1976, 37 y.o.   MRN: [id number]         [facility]  Patient Active Problem List   Diagnosis Date Noted  . Osteomyelitis 09/03/2012    Priority: High  . Acute renal insufficiency 05/07/2013  . Phantom limb pain 05/06/2013  . Hypomagnesemia 05/04/2013  . HLD (hyperlipidemia) 05/02/2013  . Hypokalemia 05/01/2013  . Cellulitis and abscess of leg, except foot 04/30/2013    Class: Acute  . Diabetic peripheral neuropathy associated with type 2 diabetes mellitus 04/30/2013    Class: Chronic  . MSSA (methicillin susceptible Staphylococcus aureus) 09/06/2012  . Bacteremia 09/04/2012  . Leukocytosis 11/11/2011  . Pulmonary emboli 11/11/2011  . Hyponatremia 09/05/2011  . Diabetic foot ulcer 09/05/2011  . Cellulitis 08/15/2011  . Obesity, Class III, BMI 40-49.9 (morbid obesity) 08/15/2011  . DM (diabetes mellitus), type 2, uncontrolled with complications 06/12/2007    Patient's Medications  New Prescriptions   No medications on file  Previous Medications   ALPRAZOLAM (XANAX) 0.25 MG TABLET    Take one tablet by mouth twice daily as needed for anxiety   ASPIRIN EC 325 MG TABLET    Take 1 tablet (325 mg total) by mouth 2 (two) times daily.   ATORVASTATIN (LIPITOR) 40 MG TABLET    Take 1 tablet (40 mg total) by mouth daily at 6 PM.   DEXTROSE 5 % SOLN 50 ML WITH CEFTRIAXONE 2 G SOLR 2 G    Inject 2 g into the vein daily.   FERROUS SULFATE 325 (65 FE) MG TABLET    Take 1 tablet (325 mg total) by mouth 3 (three) times daily after meals.   GABAPENTIN (NEURONTIN) 300 MG CAPSULE    Take 1 capsule (300 mg total) by mouth 3 (three) times daily.   IBUPROFEN (ADVIL,MOTRIN) 200 MG TABLET    Take 800 mg by mouth every 6 (six) hours as needed. pain   INSULIN GLARGINE (LANTUS) 100 UNIT/ML INJECTION    Inject 0.4 mLs (40 Units total) into the skin daily.   METRONIDAZOLE (FLAGYL) 500 MG TABLET    Take 1 tablet (500 mg total) by  mouth every 8 (eight) hours.   OXYCODONE-ACETAMINOPHEN (ROXICET) 5-325 MG PER TABLET    Take 1 tablet by mouth every 4 (four) hours as needed for pain.  Modified Medications   No medications on file  Discontinued Medications   No medications on file    Subjective: Karen Bennett is in for hospital followup visit. 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 In February of this year she developed an MSSA left foot infection complicated by bacteremia. She was left with some poorly healing skin on her left lateral foot and she was rehospitalized recently with sudden swelling, redness and pain in her left foot. She underwent debridement and an upper culture grew group B strep. She was placed on IV ceftriaxone and oral metronidazole to cover for the possibility of concomitant anaerobic infection. She is now completed 17 days of antibiotic therapy. She is currently in a skilled nursing facility. She has a VAC dressing on her left foot and says that the wound care team has decided to stop pulsed lavage because the wound is looking much better with nearly 100% red granulation tissue. She's had no problems tolerating her PICC or antibiotics.  She has become very depressed about  being in the nursing home. She recently celebrated her 52th birthday, her first birthday since her mother died last 2023/06/27. She has no other living family. She has 1 close friend who lives near her and [place], [place] but has not been able to see her since she was hospitalized. She tells me that the nurse practitioner at her skilled nursing facility and is considering starting her on Wellbutrin. She lives alone and has been unable to work. She has no primary care physician and has been denied disability previously.  Review of Systems: Pertinent items are noted in HPI.  Past Medical History  Diagnosis Date  .  Cellulitis   . Diabetes mellitus   . PE (pulmonary embolism) ~2007-2008    Not on anticoagulation    History  Substance Use Topics  . Smoking status: Never Smoker   . Smokeless tobacco: Never Used  . Alcohol Use: No    Family History  Problem Relation Age of Onset  . Diabetes Mother   . Coronary artery disease Mother     Allergies  Allergen Reactions  . Zofran Itching and Nausea And Vomiting  . Doxycycline Nausea And Vomiting  . Morphine And Related Itching    Objective: Temp: 98.2 F (36.8 C) (10/30 1453) Temp src: Oral (10/30 1453) BP: 144/80 mmHg (10/30 1453) Pulse Rate: 63 (10/30 1453)  General: She is seated in a wheelchair. She is tearful Skin: Right arm PICC site looks good Lungs: Clear Cor: Regular S1-S2 no murmurs Abdomen: Obese, soft and nontender Left foot: Her fourth and fifth toes are surgically absent. There is a VAC dressing over her left lateral foot.  Lab Results  Component Value Date   CRP 3.0* 05/08/2013   Lab Results  Component Value Date   ESRSEDRATE 100* 05/01/2013   Lab Results  Component Value Date   HGBA1C 13.0* 04/30/2013   Left foot wound culture 05/06/2013: Gram-positive cocci in pairs on Gram stain and group B strep culture  Assessment: It sounds as though her left foot infection is improving. Although he would be a healed to have her continue her IV ceftriaxone and oral Flagyl for a full 6 weeks I would consider changing ceftriaxone to amoxicillin orally and continuing metronidazole if she is able to be discharged back home before time. She seems quite depressed and is suffering an acute grief reaction on the anniversary of her mothers death. I think she would probably do much better with the support of her friend and other church members if she were able to return home sooner. I will see her back in one week to see what can be arranged.  Plan: 1. Continue IV ceftriaxone and oral metronidazole for now 2. Agree with treatment for  depression 3. Followup in one week   Cliffton Asters, MD [facility] [phone] pager   [phone] cell 05/22/2013, 3:20 PM

## 2013-05-23 ENCOUNTER — Ambulatory Visit (HOSPITAL_COMMUNITY): Payer: Self-pay

## 2013-05-23 ENCOUNTER — Other Ambulatory Visit: Payer: Self-pay

## 2013-05-23 LAB — GLUCOSE, CAPILLARY
Glucose-Capillary: 175 mg/dL — ABNORMAL HIGH (ref 70–99)
Glucose-Capillary: 181 mg/dL — ABNORMAL HIGH (ref 70–99)

## 2013-05-23 LAB — SEDIMENTATION RATE: Sed Rate: 105 mm/hr — ABNORMAL HIGH (ref 0–22)

## 2013-05-23 LAB — COMPREHENSIVE METABOLIC PANEL
ALT: 9 U/L (ref 0–35)
AST: 14 U/L (ref 0–37)
Alkaline Phosphatase: 43 U/L (ref 39–117)
BUN: 18 mg/dL (ref 6–23)
Chloride: 99 mEq/L (ref 96–112)
Creat: 1.2 mg/dL — ABNORMAL HIGH (ref 0.50–1.10)
Glucose, Bld: 185 mg/dL — ABNORMAL HIGH (ref 70–99)
Total Bilirubin: 0.4 mg/dL (ref 0.3–1.2)

## 2013-05-23 MED ORDER — OXYCODONE HCL 5 MG PO TABS: 5.0000 mg | ORAL_TABLET | ORAL | Status: DC | PRN

## 2013-05-23 NOTE — Telephone Encounter (Signed)
First rx was shredded, wrong dispense number

## 2013-05-24 LAB — GLUCOSE, CAPILLARY
Glucose-Capillary: 246 mg/dL — ABNORMAL HIGH (ref 70–99)
Glucose-Capillary: 255 mg/dL — ABNORMAL HIGH (ref 70–99)

## 2013-05-25 LAB — GLUCOSE, CAPILLARY
Glucose-Capillary: 192 mg/dL — ABNORMAL HIGH (ref 70–99)
Glucose-Capillary: 246 mg/dL — ABNORMAL HIGH (ref 70–99)

## 2013-05-26 ENCOUNTER — Ambulatory Visit (HOSPITAL_COMMUNITY): Payer: Self-pay | Admitting: Physical Therapy

## 2013-05-26 LAB — GLUCOSE, CAPILLARY
Glucose-Capillary: 154 mg/dL — ABNORMAL HIGH (ref 70–99)
Glucose-Capillary: 146 mg/dL — ABNORMAL HIGH (ref 70–99)

## 2013-05-27 LAB — GLUCOSE, CAPILLARY
Glucose-Capillary: 173 mg/dL — ABNORMAL HIGH (ref 70–99)
Glucose-Capillary: 202 mg/dL — ABNORMAL HIGH (ref 70–99)
Glucose-Capillary: 178 mg/dL — ABNORMAL HIGH (ref 70–99)
Glucose-Capillary: 164 mg/dL — ABNORMAL HIGH (ref 70–99)

## 2013-05-28 ENCOUNTER — Ambulatory Visit (HOSPITAL_COMMUNITY): Payer: Self-pay

## 2013-05-28 ENCOUNTER — Non-Acute Institutional Stay (SKILLED_NURSING_FACILITY): Payer: Self-pay | Admitting: Internal Medicine

## 2013-05-28 DIAGNOSIS — IMO0002 Reserved for concepts with insufficient information to code with codable children: Secondary | ICD-10-CM

## 2013-05-28 DIAGNOSIS — E1169 Type 2 diabetes mellitus with other specified complication: Secondary | ICD-10-CM

## 2013-05-28 DIAGNOSIS — M9080 Osteopathy in diseases classified elsewhere, unspecified site: Secondary | ICD-10-CM

## 2013-05-28 DIAGNOSIS — M869 Osteomyelitis, unspecified: Secondary | ICD-10-CM

## 2013-05-28 DIAGNOSIS — S91302S Unspecified open wound, left foot, sequela: Secondary | ICD-10-CM

## 2013-05-28 NOTE — Progress Notes (Signed)
Patient ID: Karen Bennett, female   DOB: [birth year], 37 y.o.   MRN: [id number] [facility] Chief complaint; followup large wound on her lateral left foot underlying osteomyelitis. History; this is a patient who came to [place] 2 weeks ago. 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 She had developed an abscess in the left foot to March of this year and since then has essentially been dealing with an open area in this area. She has had surgery. I believe at one point a culture of methicillin sensitive staph aureus was obtained. She came to [place] to complete 6 weeks worth of a combination of Rocephin and Flagyl. She is recently followed with Dr. Orvan Falconer of infectious disease. The patient has been receiving pulse lavage and a wound VAC care to the area.  Physical examination; Left foot; the wound itself does not look unstable the granulation tissue appears to be reasonably stable however she still has an area in this wound it easily probes to bone. The a degree of edema in the left foot is considerably better than when I saw this on October 22. This would indicate to me that the antibiotics are having some improvement. Her peripheral pulses are palpable in the foot therefore I think he would be possible to do more aggressive compression on top of the wound VAC. Silver collagen to the area H. probes to bone would also seem reasonable  Impression/plan #1 osteomyelitis left foot in the setting of poorly controlled diabetes and diabetic neuropathy. I think the wound VAC should continue here in an effort to control the area that probes to bone. She is following with orthopedics and they are apparently going to see her in another 3 weeks. Change the dressing to her left foot somewhat although she should continue with the wound VAC. She cannot ambulate on this foot or else there'll be no chance  of saving this part of her limb. I think that sending her home would be a serious error even if her antibiotics were changed to oral she has inadequate support in this setting. If The purpose of her being in a facility now is to attempt to save this foot I think she should remain here for the next several weeks to months. I think her existing antibiotics seem to be helping at least in terms of the edema and therefore there is no reason to really consider altering this in my opinion.

## 2013-05-29 ENCOUNTER — Ambulatory Visit (INDEPENDENT_AMBULATORY_CARE_PROVIDER_SITE_OTHER): Payer: Medicaid Other | Admitting: Internal Medicine

## 2013-05-29 ENCOUNTER — Encounter: Payer: Self-pay | Admitting: Internal Medicine

## 2013-05-29 VITALS — BP 140/81 | HR 62 | Temp 98.3°F

## 2013-05-29 DIAGNOSIS — M869 Osteomyelitis, unspecified: Secondary | ICD-10-CM

## 2013-05-29 LAB — GLUCOSE, CAPILLARY
Glucose-Capillary: 178 mg/dL — ABNORMAL HIGH (ref 70–99)
Glucose-Capillary: 202 mg/dL — ABNORMAL HIGH (ref 70–99)
Glucose-Capillary: 148 mg/dL — ABNORMAL HIGH (ref 70–99)
Glucose-Capillary: 173 mg/dL — ABNORMAL HIGH (ref 70–99)

## 2013-05-29 NOTE — Progress Notes (Signed)
Patient ID: Karen Bennett, female   DOB: [birth year], 37 y.o.   MRN: [id number]         [facility] for Infectious Disease  Patient Active Problem List   Diagnosis Date Noted  . Foot osteomyelitis, left 09/03/2012    Priority: High  . Depression 05/22/2013    Priority: Medium  . Grief reaction 05/22/2013    Priority: Medium  . Normocytic anemia 05/22/2013  . Acute renal insufficiency 05/07/2013  . Phantom limb pain 05/06/2013  . HLD (hyperlipidemia) 05/02/2013  . Diabetic peripheral neuropathy associated with type 2 diabetes mellitus 04/30/2013    Class: Chronic  . MSSA (methicillin susceptible Staphylococcus aureus) 09/06/2012  . Pulmonary emboli 11/11/2011  . Diabetic foot ulcer 09/05/2011  . Obesity, Class III, BMI 40-49.9 (morbid obesity) 08/15/2011  . DM (diabetes mellitus), type 2, uncontrolled with complications 06/12/2007    Patient's Medications  New Prescriptions   No medications on file  Previous Medications   ALPRAZOLAM (XANAX) 0.25 MG TABLET    Take one tablet by mouth twice daily as needed for anxiety   ASPIRIN EC 325 MG TABLET    Take 1 tablet (325 mg total) by mouth 2 (two) times daily.   ATORVASTATIN (LIPITOR) 40 MG TABLET    Take 1 tablet (40 mg total) by mouth daily at 6 PM.   BUPROPION (WELLBUTRIN XL) 300 MG 24 HR TABLET    Take 300 mg by mouth daily.   DEXTROSE 5 % SOLN 50 ML WITH CEFTRIAXONE 2 G SOLR 2 G    Inject 2 g into the vein daily.   FERROUS SULFATE 325 (65 FE) MG TABLET    Take 1 tablet (325 mg total) by mouth 3 (three) times daily after meals.   GABAPENTIN (NEURONTIN) 300 MG CAPSULE    Take 1 capsule (300 mg total) by mouth 3 (three) times daily.   IBUPROFEN (ADVIL,MOTRIN) 200 MG TABLET    Take 800 mg by mouth every 6 (six) hours as needed. pain   INSULIN GLARGINE (LANTUS) 100 UNIT/ML INJECTION    Inject 0.4 mLs (40 Units total) into the skin daily.   METRONIDAZOLE (FLAGYL) 500 MG TABLET    Take 1 tablet (500 mg total) by mouth every 8  (eight) hours.   OXYCODONE (OXY IR/ROXICODONE) 5 MG IMMEDIATE RELEASE TABLET    Take 1 tablet (5 mg total) by mouth every 4 (four) hours as needed for pain.   OXYCODONE-ACETAMINOPHEN (ROXICET) 5-325 MG PER TABLET    Take 1 tablet by mouth every 4 (four) hours as needed for pain.  Modified Medications   No medications on file  Discontinued Medications   No medications on file    Subjective: Karen Bennett is in for her followup visit. She is feeling better today both physically and emotionally. She has made the decision to stay at the skilled nursing facility throughout her planned six-week course of IV ceftriaxone along with oral metronidazole. She has now completed 23 days of therapy. Her wound continues to decrease in size. She's had no problems tolerating her PICC or antibiotics  Review of Systems: Pertinent items are noted in HPI.  Past Medical History  Diagnosis Date  . Cellulitis   . Diabetes mellitus   . PE (pulmonary embolism) ~2007-2008    Not on anticoagulation    History  Substance Use Topics  . Smoking status: Never Smoker   . Smokeless tobacco: Never Used  . Alcohol Use: No    Family History  Problem Relation Age  of Onset  . Diabetes Mother   . Coronary artery disease Mother     Allergies  Allergen Reactions  . Zofran Itching and Nausea And Vomiting  . Doxycycline Nausea And Vomiting  . Morphine And Related Itching    Objective: Temp: 98.3 F (36.8 C) (11/06 1339) Temp src: Oral (11/06 1339) BP: 140/81 mmHg (11/06 1339) Pulse Rate: 62 (11/06 1339)  General: She is much better spirits Skin: Right arm PICC site appears normal She has a VAC dressing on her left foot wound  Lab Results  Component Value Date   CRP <0.5 05/22/2013   Lab Results  Component Value Date   ESRSEDRATE 105* 05/22/2013     Assessment: Her left foot osteomyelitis and wound infection is improving with wound care and antibiotics. We'll plan on 19 more days of IV ceftriaxone and oral  metronidazole.  Plan: 1. Continue antibiotics through November 25 2. Followup in 3 weeks   Cliffton Asters, MD [facility] for Infectious Disease [facility] [phone] pager   [phone] cell 05/29/2013, 1:56 PM

## 2013-05-30 ENCOUNTER — Ambulatory Visit (HOSPITAL_COMMUNITY): Payer: Self-pay | Admitting: Physical Therapy

## 2013-05-30 LAB — GLUCOSE, CAPILLARY
Glucose-Capillary: 129 mg/dL — ABNORMAL HIGH (ref 70–99)
Glucose-Capillary: 265 mg/dL — ABNORMAL HIGH (ref 70–99)
Glucose-Capillary: 151 mg/dL — ABNORMAL HIGH (ref 70–99)
Glucose-Capillary: 185 mg/dL — ABNORMAL HIGH (ref 70–99)
Glucose-Capillary: 195 mg/dL — ABNORMAL HIGH (ref 70–99)

## 2013-05-31 LAB — GLUCOSE, CAPILLARY: Glucose-Capillary: 233 mg/dL — ABNORMAL HIGH (ref 70–99)

## 2013-06-01 LAB — GLUCOSE, CAPILLARY
Glucose-Capillary: 166 mg/dL — ABNORMAL HIGH (ref 70–99)
Glucose-Capillary: 177 mg/dL — ABNORMAL HIGH (ref 70–99)
Glucose-Capillary: 221 mg/dL — ABNORMAL HIGH (ref 70–99)

## 2013-06-02 LAB — GLUCOSE, CAPILLARY
Glucose-Capillary: 201 mg/dL — ABNORMAL HIGH (ref 70–99)
Glucose-Capillary: 167 mg/dL — ABNORMAL HIGH (ref 70–99)

## 2013-06-03 ENCOUNTER — Non-Acute Institutional Stay (INDEPENDENT_AMBULATORY_CARE_PROVIDER_SITE_OTHER): Payer: Self-pay | Admitting: Internal Medicine

## 2013-06-03 DIAGNOSIS — M25572 Pain in left ankle and joints of left foot: Secondary | ICD-10-CM

## 2013-06-03 DIAGNOSIS — M25579 Pain in unspecified ankle and joints of unspecified foot: Secondary | ICD-10-CM

## 2013-06-03 LAB — GLUCOSE, CAPILLARY
Glucose-Capillary: 158 mg/dL — ABNORMAL HIGH (ref 70–99)
Glucose-Capillary: 174 mg/dL — ABNORMAL HIGH (ref 70–99)
Glucose-Capillary: 194 mg/dL — ABNORMAL HIGH (ref 70–99)

## 2013-06-03 NOTE — Progress Notes (Signed)
Patient ID: Karen Bennett, female   DOB: Dec 10, 1975, 37 y.o.   MRN: [id number]  This is an acute visit.  Level of care skilled.  Facility [facility].  Chief complaint-acute visit secondary to pain management.  History of present illness.  The patient is a pleasant 37 year old female here for long-term IV antibiotic therapy secondary to left foot osteomyelitis.  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  She continues to have significant pain although clinically this appears to be improving-I did recently start her on OxyContin twice a day controlled-release as well as OxyIR 5 mg every 4 hours-she is taking the OxyIR regularly every 4 hours-and feels she needs a bit stronger pain medicine.  She does not complaining of any respiratory depression her sedation and nursing staff has not noted this either her vital signs remained stable.  Family medical social history reviewed per progress note on 05/14/2013.  Medications have been reviewed.  Review of systems  Gen. denies any fever or chills.  Respiratory no complaints of shortness of breath or cough.  Cardiac does not complaining of chest pain.  Muscle skeletal-as noted in history of present illness she is complaining of foot pain continued this apparently is not totally relieved with the current pain regiment.  Physical exam.  Temperature is 97.1 pulse 61 respirations 21 blood pressure 139/80 weight is 394.3 this is down about 25 pounds during her stay here which is encouraging.  General this is a morbidly obese young female in no distress lying in bed.  Her skin is warm and dry there is wrapping applied to her lower left leg and foot along with wound VAC applied-she does have positive capillary refill in her toes they are warm to touch with touch she is able to  move them.  Chest is clear to auscultation no labored breathing.  Heart is regular rate and rhythm without murmur gallop or rub.  Labs  06/02/2013.  WBC 5.2 hemoglobin 11.6 platelets 262.  Sodium 135 potassium 4.2 BUN 16 creatinine 0.83.  Liver function tests within normal limits except albumin of 3.3  .  .  Assessment and plan.  #1-pain with history of osteomyelitis-clinically she appears to be stable-she is continuing to complain of pain will increase her OxyContin controlled release  to 20 mg twice a day and continue OxyIR 5 mg every 4 hours when necessary  and monitor this.  [id number]  .

## 2013-06-04 LAB — GLUCOSE, CAPILLARY
Glucose-Capillary: 183 mg/dL — ABNORMAL HIGH (ref 70–99)
Glucose-Capillary: 179 mg/dL — ABNORMAL HIGH (ref 70–99)
Glucose-Capillary: 170 mg/dL — ABNORMAL HIGH (ref 70–99)

## 2013-06-05 LAB — GLUCOSE, CAPILLARY
Glucose-Capillary: 159 mg/dL — ABNORMAL HIGH (ref 70–99)
Glucose-Capillary: 160 mg/dL — ABNORMAL HIGH (ref 70–99)
Glucose-Capillary: 124 mg/dL — ABNORMAL HIGH (ref 70–99)
Glucose-Capillary: 233 mg/dL — ABNORMAL HIGH (ref 70–99)
Glucose-Capillary: 202 mg/dL — ABNORMAL HIGH (ref 70–99)

## 2013-06-06 LAB — GLUCOSE, CAPILLARY: Glucose-Capillary: 156 mg/dL — ABNORMAL HIGH (ref 70–99)

## 2013-06-07 LAB — GLUCOSE, CAPILLARY
Glucose-Capillary: 154 mg/dL — ABNORMAL HIGH (ref 70–99)
Glucose-Capillary: 246 mg/dL — ABNORMAL HIGH (ref 70–99)
Glucose-Capillary: 192 mg/dL — ABNORMAL HIGH (ref 70–99)

## 2013-06-09 ENCOUNTER — Non-Acute Institutional Stay (SKILLED_NURSING_FACILITY): Payer: Self-pay | Admitting: Internal Medicine

## 2013-06-09 DIAGNOSIS — M869 Osteomyelitis, unspecified: Secondary | ICD-10-CM

## 2013-06-09 DIAGNOSIS — E1169 Type 2 diabetes mellitus with other specified complication: Secondary | ICD-10-CM

## 2013-06-09 DIAGNOSIS — M9080 Osteopathy in diseases classified elsewhere, unspecified site: Secondary | ICD-10-CM

## 2013-06-09 LAB — GLUCOSE, CAPILLARY: Glucose-Capillary: 180 mg/dL — ABNORMAL HIGH (ref 70–99)

## 2013-06-10 LAB — GLUCOSE, CAPILLARY
Glucose-Capillary: 207 mg/dL — ABNORMAL HIGH (ref 70–99)
Glucose-Capillary: 181 mg/dL — ABNORMAL HIGH (ref 70–99)
Glucose-Capillary: 244 mg/dL — ABNORMAL HIGH (ref 70–99)
Glucose-Capillary: 192 mg/dL — ABNORMAL HIGH (ref 70–99)

## 2013-06-11 LAB — GLUCOSE, CAPILLARY
Glucose-Capillary: 193 mg/dL — ABNORMAL HIGH (ref 70–99)
Glucose-Capillary: 218 mg/dL — ABNORMAL HIGH (ref 70–99)

## 2013-06-12 LAB — GLUCOSE, CAPILLARY
Glucose-Capillary: 179 mg/dL — ABNORMAL HIGH (ref 70–99)
Glucose-Capillary: 236 mg/dL — ABNORMAL HIGH (ref 70–99)
Glucose-Capillary: 223 mg/dL — ABNORMAL HIGH (ref 70–99)
Glucose-Capillary: 197 mg/dL — ABNORMAL HIGH (ref 70–99)
Glucose-Capillary: 222 mg/dL — ABNORMAL HIGH (ref 70–99)

## 2013-06-13 LAB — GLUCOSE, CAPILLARY: Glucose-Capillary: 192 mg/dL — ABNORMAL HIGH (ref 70–99)

## 2013-06-14 LAB — GLUCOSE, CAPILLARY
Glucose-Capillary: 178 mg/dL — ABNORMAL HIGH (ref 70–99)
Glucose-Capillary: 208 mg/dL — ABNORMAL HIGH (ref 70–99)
Glucose-Capillary: 257 mg/dL — ABNORMAL HIGH (ref 70–99)

## 2013-06-15 LAB — GLUCOSE, CAPILLARY
Glucose-Capillary: 243 mg/dL — ABNORMAL HIGH (ref 70–99)
Glucose-Capillary: 245 mg/dL — ABNORMAL HIGH (ref 70–99)
Glucose-Capillary: 206 mg/dL — ABNORMAL HIGH (ref 70–99)

## 2013-06-16 ENCOUNTER — Other Ambulatory Visit: Payer: Self-pay

## 2013-06-16 ENCOUNTER — Ambulatory Visit: Payer: Self-pay | Admitting: Internal Medicine

## 2013-06-16 LAB — GLUCOSE, CAPILLARY
Glucose-Capillary: 217 mg/dL — ABNORMAL HIGH (ref 70–99)
Glucose-Capillary: 250 mg/dL — ABNORMAL HIGH (ref 70–99)

## 2013-06-16 MED ORDER — OXYCODONE HCL 5 MG PO TABS: ORAL_TABLET | ORAL | Status: DC

## 2013-06-17 LAB — GLUCOSE, CAPILLARY
Glucose-Capillary: 249 mg/dL — ABNORMAL HIGH (ref 70–99)
Glucose-Capillary: 145 mg/dL — ABNORMAL HIGH (ref 70–99)

## 2013-06-18 ENCOUNTER — Ambulatory Visit: Payer: Self-pay | Admitting: Internal Medicine

## 2013-06-18 LAB — GLUCOSE, CAPILLARY: Glucose-Capillary: 173 mg/dL — ABNORMAL HIGH (ref 70–99)

## 2013-06-18 NOTE — Progress Notes (Addendum)
Patient ID: Karen Bennett, female   DOB: [birth year], 37 y.o.   MRN: [id number]           PROGRESS NOTE  DATE:  06/09/2013  FACILITY: [facility]    LEVEL OF CARE:   SNF   Acute Visit   CHIEF COMPLAINT:  Follow up left foot osteomyelitis.    HISTORY OF PRESENT ILLNESS:  This is a young woman whom we have been treating for osteomyelitis of the left foot in the setting of poorly controlled diabetes.  She has been receiving Rocephin IV and there has been a considerable improvement in the swelling surrounding the wound on her foot.  Therefore, I think the Rocephin has been effective.  She has been followed by Dr. Orvan Falconer as well as Orthopedics, Dr. Otelia Sergeant.      PHYSICAL EXAMINATION:   SKIN:  INSPECTION:  The wound on the lateral aspect of her left foot has shown tremendous improvement with healthy granulation.   The only issue here is a crevice in the mid aspect of the wound.  One of these seems superficial, the other is deep although I cannot actually probe to bone with this.  There is no evidence of purulence.  As mentioned, the edema in her foot is considerably better.     ASSESSMENT/PLAN:  Extensive left foot wound.  This has improved quite a bit while she has been here.  She remains on Rocephin.  I am going to pack the crevice loosely with collagen under the foam and a wound VAC.  If the crevice can be teased into closing, then I think that simpler dressings could possibly be used here.  There is some talk about discharging towards the end of the month.  If so, care at home will need to be arranged and she will need to keep the pressure off this area.  Followup at [facility] wound center would be the best option  CPT CODE: 40981

## 2013-06-19 LAB — GLUCOSE, CAPILLARY
Glucose-Capillary: 155 mg/dL — ABNORMAL HIGH (ref 70–99)
Glucose-Capillary: 192 mg/dL — ABNORMAL HIGH (ref 70–99)
Glucose-Capillary: 220 mg/dL — ABNORMAL HIGH (ref 70–99)

## 2013-06-20 LAB — GLUCOSE, CAPILLARY
Glucose-Capillary: 240 mg/dL — ABNORMAL HIGH (ref 70–99)
Glucose-Capillary: 250 mg/dL — ABNORMAL HIGH (ref 70–99)

## 2013-06-21 LAB — GLUCOSE, CAPILLARY
Glucose-Capillary: 156 mg/dL — ABNORMAL HIGH (ref 70–99)
Glucose-Capillary: 192 mg/dL — ABNORMAL HIGH (ref 70–99)

## 2013-06-22 LAB — GLUCOSE, CAPILLARY
Glucose-Capillary: 212 mg/dL — ABNORMAL HIGH (ref 70–99)
Glucose-Capillary: 192 mg/dL — ABNORMAL HIGH (ref 70–99)
Glucose-Capillary: 205 mg/dL — ABNORMAL HIGH (ref 70–99)
Glucose-Capillary: 177 mg/dL — ABNORMAL HIGH (ref 70–99)

## 2013-06-23 LAB — GLUCOSE, CAPILLARY
Glucose-Capillary: 149 mg/dL — ABNORMAL HIGH (ref 70–99)
Glucose-Capillary: 178 mg/dL — ABNORMAL HIGH (ref 70–99)
Glucose-Capillary: 214 mg/dL — ABNORMAL HIGH (ref 70–99)

## 2013-06-24 ENCOUNTER — Encounter: Payer: Self-pay | Admitting: Internal Medicine

## 2013-06-24 ENCOUNTER — Other Ambulatory Visit: Payer: Self-pay | Admitting: *Deleted

## 2013-06-24 ENCOUNTER — Ambulatory Visit (INDEPENDENT_AMBULATORY_CARE_PROVIDER_SITE_OTHER): Payer: Medicaid Other | Admitting: Internal Medicine

## 2013-06-24 VITALS — BP 141/80 | HR 86 | Temp 98.3°F

## 2013-06-24 DIAGNOSIS — M869 Osteomyelitis, unspecified: Secondary | ICD-10-CM

## 2013-06-24 MED ORDER — OXYCODONE HCL ER 20 MG PO T12A: EXTENDED_RELEASE_TABLET | ORAL | Status: DC

## 2013-06-24 NOTE — Progress Notes (Signed)
Patient ID: Karen Bennett, female   DOB: [birth year], 37 y.o.   MRN: [id number]         [facility] for Infectious Disease  Patient Active Problem List   Diagnosis Date Noted  . Foot osteomyelitis, left 09/03/2012    Priority: High  . Depression 05/22/2013    Priority: Medium  . Grief reaction 05/22/2013    Priority: Medium  . Normocytic anemia 05/22/2013  . Acute renal insufficiency 05/07/2013  . Phantom limb pain 05/06/2013  . HLD (hyperlipidemia) 05/02/2013  . Diabetic peripheral neuropathy associated with type 2 diabetes mellitus 04/30/2013    Class: Chronic  . MSSA (methicillin susceptible Staphylococcus aureus) 09/06/2012  . Pulmonary emboli 11/11/2011  . Diabetic foot ulcer 09/05/2011  . Obesity, Class III, BMI 40-49.9 (morbid obesity) 08/15/2011  . DM (diabetes mellitus), type 2, uncontrolled with complications 06/12/2007    Patient's Medications  New Prescriptions   No medications on file  Previous Medications   ALPRAZOLAM (XANAX) 0.25 MG TABLET    Take one tablet by mouth twice daily as needed for anxiety   ASPIRIN EC 325 MG TABLET    Take 1 tablet (325 mg total) by mouth 2 (two) times daily.   ATORVASTATIN (LIPITOR) 40 MG TABLET    Take 1 tablet (40 mg total) by mouth daily at 6 PM.   BUPROPION (WELLBUTRIN XL) 300 MG 24 HR TABLET    Take 300 mg by mouth daily.   FERROUS SULFATE 325 (65 FE) MG TABLET    Take 1 tablet (325 mg total) by mouth 3 (three) times daily after meals.   GABAPENTIN (NEURONTIN) 300 MG CAPSULE    Take 1 capsule (300 mg total) by mouth 3 (three) times daily.   IBUPROFEN (ADVIL,MOTRIN) 200 MG TABLET    Take 800 mg by mouth every 6 (six) hours as needed. pain   INSULIN GLARGINE (LANTUS) 100 UNIT/ML INJECTION    Inject 0.4 mLs (40 Units total) into the skin daily.   OXYCODONE (OXY IR/ROXICODONE) 5 MG IMMEDIATE RELEASE TABLET    1 by mouth every 4 hours as needed for breakthrough pain   OXYCODONE (OXYCONTIN) 20 MG T12A 12 HR TABLET    Take one  tablet by mouth twice daily for pain. Hold for sedation. Do not crush   OXYCODONE-ACETAMINOPHEN (ROXICET) 5-325 MG PER TABLET    Take 1 tablet by mouth every 4 (four) hours as needed for pain.  Modified Medications   No medications on file  Discontinued Medications   No medications on file    Subjective: Karen Bennett is in for her routine visit. She completed 6 weeks of antibiotic therapy on November 25. She was on IV ceftriaxone and oral metronidazole. She had no problems tolerating her PICC or antibiotics. Her PICC was removed yesterday. I spoke with her nurse yesterday and she reported that the wound is looking much better. Karen Bennett confirms that the area on the dorsum of her left foot is beefy red with good granulation tissue. There is only slight drainage and no odor. She has not had any fever, chills or sweats. She has been discharged to a skilled nursing facility and will be going on today after this appointment. Members of her church have agreed to give her transportation to her doctor visits. She will be going to the wound Center tomorrow and has a [facility]. She says that her blood sugars are slowly coming under better control.  Past Medical History  Diagnosis Date  . Cellulitis   . Diabetes mellitus   . PE (pulmonary embolism) ~2007-2008    Not on anticoagulation    History  Substance Use Topics  . Smoking status: Never Smoker   . Smokeless tobacco: Never Used  . Alcohol Use: No    Family History  Problem Relation Age of Onset  . Diabetes Mother   . Coronary artery disease Mother     Allergies  Allergen Reactions  . Zofran Itching and Nausea And Vomiting  . Doxycycline Nausea And Vomiting  . Morphine And Related Itching    Objective: Temp: 98.3 F (36.8 C) (12/02 0912) Temp src: Oral (12/02 0912) BP: 141/80 mmHg (12/02 0912) Pulse Rate: 86 (12/02 0912)  General: She is in much better spirits. She is seated in  a wheelchair Skin: Right arm former PICC site appears normal Left foot is in an Radio broadcast assistant dressing  Lab Results  Component Value Date   CRP <0.5 05/22/2013   Lab Results  Component Value Date   ESRSEDRATE 105* 05/22/2013    Assessment: I suspect that her soft tissue abscess and osteomyelitis have been cured.  Plan: 1. Observe off of antibiotics 2. Followup here as needed   Cliffton Asters, MD [facility] for Infectious Disease [facility] [phone] pager   [phone] cell 06/24/2013, 9:35 AM

## 2013-06-25 ENCOUNTER — Encounter (HOSPITAL_BASED_OUTPATIENT_CLINIC_OR_DEPARTMENT_OTHER): Payer: Medicaid Other | Attending: General Surgery

## 2013-06-25 DIAGNOSIS — L97509 Non-pressure chronic ulcer of other part of unspecified foot with unspecified severity: Secondary | ICD-10-CM | POA: Insufficient documentation

## 2013-06-25 DIAGNOSIS — Z79899 Other long term (current) drug therapy: Secondary | ICD-10-CM | POA: Insufficient documentation

## 2013-06-25 DIAGNOSIS — Z86711 Personal history of pulmonary embolism: Secondary | ICD-10-CM | POA: Insufficient documentation

## 2013-06-25 DIAGNOSIS — Z794 Long term (current) use of insulin: Secondary | ICD-10-CM | POA: Insufficient documentation

## 2013-06-25 DIAGNOSIS — D649 Anemia, unspecified: Secondary | ICD-10-CM | POA: Insufficient documentation

## 2013-06-25 DIAGNOSIS — N289 Disorder of kidney and ureter, unspecified: Secondary | ICD-10-CM | POA: Insufficient documentation

## 2013-06-25 DIAGNOSIS — Z8614 Personal history of Methicillin resistant Staphylococcus aureus infection: Secondary | ICD-10-CM | POA: Insufficient documentation

## 2013-06-25 DIAGNOSIS — S98139A Complete traumatic amputation of one unspecified lesser toe, initial encounter: Secondary | ICD-10-CM | POA: Insufficient documentation

## 2013-06-25 DIAGNOSIS — E785 Hyperlipidemia, unspecified: Secondary | ICD-10-CM | POA: Insufficient documentation

## 2013-06-25 DIAGNOSIS — E1069 Type 1 diabetes mellitus with other specified complication: Secondary | ICD-10-CM | POA: Insufficient documentation

## 2013-06-26 ENCOUNTER — Ambulatory Visit: Payer: Medicaid Other | Attending: Internal Medicine | Admitting: Cardiology

## 2013-06-26 ENCOUNTER — Encounter: Payer: Self-pay | Admitting: Internal Medicine

## 2013-06-26 VITALS — BP 150/79 | HR 76 | Temp 98.4°F | Resp 16 | Wt >= 6400 oz

## 2013-06-26 DIAGNOSIS — F341 Dysthymic disorder: Secondary | ICD-10-CM

## 2013-06-26 DIAGNOSIS — E1169 Type 2 diabetes mellitus with other specified complication: Secondary | ICD-10-CM | POA: Insufficient documentation

## 2013-06-26 DIAGNOSIS — F329 Major depressive disorder, single episode, unspecified: Secondary | ICD-10-CM | POA: Insufficient documentation

## 2013-06-26 DIAGNOSIS — Z139 Encounter for screening, unspecified: Secondary | ICD-10-CM

## 2013-06-26 DIAGNOSIS — M869 Osteomyelitis, unspecified: Secondary | ICD-10-CM | POA: Insufficient documentation

## 2013-06-26 DIAGNOSIS — M79609 Pain in unspecified limb: Secondary | ICD-10-CM

## 2013-06-26 DIAGNOSIS — E785 Hyperlipidemia, unspecified: Secondary | ICD-10-CM

## 2013-06-26 DIAGNOSIS — F419 Anxiety disorder, unspecified: Secondary | ICD-10-CM

## 2013-06-26 DIAGNOSIS — E119 Type 2 diabetes mellitus without complications: Secondary | ICD-10-CM

## 2013-06-26 DIAGNOSIS — M79672 Pain in left foot: Secondary | ICD-10-CM

## 2013-06-26 DIAGNOSIS — M9080 Osteopathy in diseases classified elsewhere, unspecified site: Secondary | ICD-10-CM | POA: Insufficient documentation

## 2013-06-26 HISTORY — DX: Anxiety disorder, unspecified: F41.9

## 2013-06-26 LAB — GLUCOSE, POCT (MANUAL RESULT ENTRY): POC Glucose: 179 mg/dl — AB (ref 70–99)

## 2013-06-26 MED ORDER — IBUPROFEN 800 MG PO TABS: 800.0000 mg | ORAL_TABLET | Freq: Three times a day (TID) | ORAL | Status: DC | PRN

## 2013-06-26 MED ORDER — GLUCOSE BLOOD VI STRP: ORAL_STRIP | Status: AC

## 2013-06-26 MED ORDER — FREESTYLE SYSTEM KIT: 1.0000 | PACK | Status: AC | PRN

## 2013-06-26 NOTE — Progress Notes (Signed)
Patient here  For follow up from Nursing home Has osteomyelitis to her left foot Wound vac was taken off this past week History of DM Needs medication refill Needs script for test strips for one touch ultra 2 glucometer

## 2013-06-26 NOTE — Progress Notes (Signed)
[facility]  NAME:  Karen Bennett, Karen Bennett NO.:  [id number]  MEDICAL RECORD NO.:  [id number]      DATE OF BIRTH:  09/27/1975  PHYSICIAN:  Ardath Sax, M.D.           VISIT DATE:                                  OFFICE VISIT   This is the first visit to the Wound Clinic for [facility], who is a 37 year old morbidly obese, diabetic, she weighs 404 pounds.  She not only has a diagnosis of type 1 diabetes, but she has renal insufficiency, normocytic anemia, hyperlipidemia, history of pulmonary emboli, history of diabetic foot ulcer on the left foot, which required multiple procedures including amputation of the 4th and 5th toes, and later procedures for removing areas of osteomyelitis of her 4th and 5th metatarsals.  She comes to [place] today with an ulcer on the lateral aspect of her left foot that is about 10 cm in diameter, and it has been treated at the hospital with multiple debridements and also wound VAC. She has also had 6 weeks of IV antibiotics.  Apparently, it grew out MRSA.  Her temperature is 98, pulse 80, respirations 18, blood pressure 131/81.  Her medicines that she takes are Xanax, aspirin, Lipitor, Wellbutrin XL, ferrous sulfate, gabapentin, ibuprofen, Lantus insulin, and she is also on oxycodone every day.  I am going to start treating this with silver collagen.  I really think that this is going to take a very long time. 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     Ardath Sax, M.D.     PP/MEDQ  D:  06/25/2013  T:  06/26/2013  Job:  [id number]

## 2013-06-26 NOTE — Patient Instructions (Signed)

## 2013-06-26 NOTE — Progress Notes (Signed)
MRN: [id number] Name: Karen Bennett  Sex: female Age: 37 y.o. DOB: May 30, 1976  Allergies: Zofran; Doxycycline; and Morphine and related  Chief Complaint  Patient presents with  . Hospitalization Follow-up    HPI: Patient is 37 y.o. female who comes today for hospital follow up and establish medical care, she history of diabetes, EMR reviewed patient had left foot osteomyelitis, patient has been following up with infectious disease clinic completed the course of IV antibiotics. Now following up with the wound clinic, she also history of anxiety/depression and is taking Klonopin and Xanax, she monitors her blood sugar at home as per patient fasting sugars are more than 1 50 mg/dL, she is requesting pain medication has left foot pain denies any fever chills.  Past Medical History  Diagnosis Date  . Cellulitis   . Diabetes mellitus   . PE (pulmonary embolism) ~2007-2008    Not on anticoagulation    Past Surgical History  Procedure Laterality Date  . Toe amputation      last 2 on L foot  . Surgery to remove hematoma in l leg    . Abscess removal from l groin    . I&d extremity  11/12/2011    Procedure: IRRIGATION AND DEBRIDEMENT EXTREMITY;  Surgeon: Kathryne Hitch, MD;  Location: [facility];  Service: Orthopedics;  Laterality: Left;  foot left  . I&d extremity Left 09/06/2012    Procedure: IRRIGATION AND DEBRIDEMENT EXTREMITY;  Surgeon: Eldred Manges, MD;  Location: [facility];  Service: Orthopedics;  Laterality: Left;  . I&d extremity Left 05/01/2013    Procedure: INCISION AND DRAINAGE LEFT FOREFOOT ABCESS ;  Surgeon: Kerrin Champagne, MD;  Location: [facility];  Service: Orthopedics;  Laterality: Left;  . Application of wound vac Left 05/01/2013    Procedure: APPLICATION OF WOUND VAC;  Surgeon: Kerrin Champagne, MD;  Location: [facility];  Service: Orthopedics;  Laterality: Left;      Medication List       This list is accurate as of: 06/26/13 11:38 AM.  Always use your most recent med list.                ALPRAZolam 0.25 MG tablet  Commonly known as:  XANAX  Take one tablet by mouth twice daily as needed for anxiety     aspirin EC 325 MG tablet  Take 1 tablet (325 mg total) by mouth 2 (two) times daily.     atorvastatin 40 MG tablet  Commonly known as:  LIPITOR  Take 1 tablet (40 mg total) by mouth daily at 6 PM.     buPROPion 300 MG 24 hr tablet  Commonly known as:  WELLBUTRIN XL  Take 300 mg by mouth daily.     clonazePAM 0.5 MG tablet  Commonly known as:  KLONOPIN  Take 0.5 mg by mouth daily. Take in the AM     ferrous sulfate 325 (65 FE) MG tablet  Take 1 tablet (325 mg total) by mouth 3 (three) times daily after meals.     gabapentin 300 MG capsule  Commonly known as:  NEURONTIN  Take 1 capsule (300 mg total) by mouth 3 (three) times daily.     glucose blood test strip  Use as instructed     ibuprofen 800 MG tablet  Commonly known as:  ADVIL,MOTRIN  Take 1 tablet (800 mg total) by mouth every 8 (eight) hours as needed. pain     insulin aspart 100 UNIT/ML injection  Commonly known  as:  novoLOG  Inject 5 Units into the skin once. For blood sugar >150     insulin glargine 100 UNIT/ML injection  Commonly known as:  LANTUS  Inject 0.4 mLs (40 Units total) into the skin daily.     oxyCODONE 5 MG immediate release tablet  Commonly known as:  Oxy IR/ROXICODONE  1 by mouth every 4 hours as needed for breakthrough pain     OxyCODONE 20 mg T12a 12 hr tablet  Commonly known as:  OXYCONTIN  Take one tablet by mouth twice daily for pain. Hold for sedation. Do not crush     oxyCODONE-acetaminophen 5-325 MG per tablet  Commonly known as:  ROXICET  Take 1 tablet by mouth every 4 (four) hours as needed for pain.     zolpidem 5 MG tablet  Commonly known as:  AMBIEN  Take 5 mg by mouth at bedtime as needed for sleep.        Meds ordered this encounter  Medications  . insulin aspart (NOVOLOG) 100 UNIT/ML injection    Sig: Inject 5 Units into the skin  once. For blood sugar >150  . zolpidem (AMBIEN) 5 MG tablet    Sig: Take 5 mg by mouth at bedtime as needed for sleep.  . clonazePAM (KLONOPIN) 0.5 MG tablet    Sig: Take 0.5 mg by mouth daily. Take in the AM  . glucose blood test strip    Sig: Use as instructed    Dispense:  100 each    Refill:  12  . ibuprofen (ADVIL,MOTRIN) 800 MG tablet    Sig: Take 1 tablet (800 mg total) by mouth every 8 (eight) hours as needed. pain    Dispense:  90 tablet    Refill:  2    Immunization History  Administered Date(s) Administered  . Influenza Split 08/16/2011  . Influenza-Unspecified 05/22/2013  . Pneumococcal Polysaccharide-23 09/06/2011    History  Substance Use Topics  . Smoking status: Never Smoker   . Smokeless tobacco: Never Used  . Alcohol Use: No    Review of Systems  As noted in HPI  Filed Vitals:   06/26/13 1100  BP: 150/79  Pulse: 76  Temp: 98.4 F (36.9 C)  Resp: 16    Physical Exam  Physical Exam  Constitutional: She is oriented to person, place, and time.  Obese female sitting comfortably not in acute distress   Eyes: EOM are normal. Pupils are equal, round, and reactive to light.  Cardiovascular: Normal rate and regular rhythm.   Pulmonary/Chest: Breath sounds normal. No respiratory distress. She has no wheezes. She has no rales.  Musculoskeletal:  Left foot leg wound dressing in place  Neurological: She is alert and oriented to person, place, and time.    CBC    Component Value Date/Time   WBC 7.0 05/22/2013 1554   RBC 3.66* 05/22/2013 1554   RBC 3.31* 09/08/2012 0500   HGB 10.7* 05/22/2013 1554   HCT 31.4* 05/22/2013 1554   PLT 363 05/22/2013 1554   MCV 85.8 05/22/2013 1554   LYMPHSABS 1.7 05/09/2013 0435   MONOABS 0.6 05/09/2013 0435   EOSABS 0.1 05/09/2013 0435   BASOSABS 0.0 05/09/2013 0435    CMP     Component Value Date/Time   NA 135 05/22/2013 1554   K 4.2 05/22/2013 1554   CL 99 05/22/2013 1554   CO2 28 05/22/2013 1554   GLUCOSE  185* 05/22/2013 1554   BUN 18 05/22/2013 1554   CREATININE 1.20*  05/22/2013 1554   CREATININE 1.68* 05/12/2013 0426   CALCIUM 9.1 05/22/2013 1554   PROT 7.5 05/22/2013 1554   ALBUMIN 3.5 05/22/2013 1554   AST 14 05/22/2013 1554   ALT 9 05/22/2013 1554   ALKPHOS 43 05/22/2013 1554   BILITOT 0.4 05/22/2013 1554   GFRNONAA 39* 05/12/2013 0426   GFRAA 45* 05/12/2013 0426    Lab Results  Component Value Date/Time   CHOL 183 05/02/2013  5:20 AM    No components found with this basename: hga1c    Lab Results  Component Value Date/Time   AST 14 05/22/2013  3:54 PM    Assessment and Plan  DM (diabetes mellitus) - Plan: Glucose (CBG), HgB A1c 7.5% patient advised to increase the dose of Lantus to 42 units and titrate the dose of 44 units until fasting sugars between 100 to 120 milligram per deciliter , glucose blood test strip, COMPLETE METABOLIC PANEL WITH GFR  Foot osteomyelitis, left.. completed a course of antibiotic follow up with wound clinic  HLD (hyperlipidemia)  Left foot pain - Plan: ibuprofen (ADVIL,MOTRIN) 800 MG tablet  Anxiety and depression - Plan: Ambulatory referral to Psychiatry  Screening - Plan: TSH, Lipid panel, Vit D  25 hydroxy (rtn osteoporosis monitoring), CBC with Differential  Up to date with flue shot   Return in about 2 months (around 08/27/2013).  Doris Cheadle, MD

## 2013-07-11 ENCOUNTER — Ambulatory Visit (INDEPENDENT_AMBULATORY_CARE_PROVIDER_SITE_OTHER): Payer: Self-pay | Admitting: Surgery

## 2013-07-11 ENCOUNTER — Encounter (INDEPENDENT_AMBULATORY_CARE_PROVIDER_SITE_OTHER): Payer: Self-pay

## 2013-07-11 ENCOUNTER — Telehealth (INDEPENDENT_AMBULATORY_CARE_PROVIDER_SITE_OTHER): Payer: Self-pay

## 2013-07-11 ENCOUNTER — Encounter (INDEPENDENT_AMBULATORY_CARE_PROVIDER_SITE_OTHER): Payer: Self-pay | Admitting: Surgery

## 2013-07-11 ENCOUNTER — Telehealth (INDEPENDENT_AMBULATORY_CARE_PROVIDER_SITE_OTHER): Payer: Self-pay | Admitting: Surgery

## 2013-07-11 VITALS — BP 126/78 | HR 80 | Temp 98.0°F | Resp 18 | Ht 68.0 in | Wt 375.0 lb

## 2013-07-11 DIAGNOSIS — E1169 Type 2 diabetes mellitus with other specified complication: Secondary | ICD-10-CM

## 2013-07-11 DIAGNOSIS — L97509 Non-pressure chronic ulcer of other part of unspecified foot with unspecified severity: Secondary | ICD-10-CM

## 2013-07-11 DIAGNOSIS — E11621 Type 2 diabetes mellitus with foot ulcer: Secondary | ICD-10-CM

## 2013-07-11 NOTE — Telephone Encounter (Signed)
Pt called stating she is very concerned about the conflicting information about scheduling her surgery. She states [facility] Addison would not schedule surgery due to pts inability to pay. She wants to speak with Dr Daphine Deutscher. I advised pt I will send this msg to Dr Daphine Deutscher and to [place] to call pt at [phone].

## 2013-07-11 NOTE — Patient Instructions (Signed)
Skin Grafting  Skin-grafting is a procedure in which a patch of healthy skin is removed from one part of your body and moved to another part of your body which has no skin. It is done under an anesthetic. This means medications are used to make you pain free while healthy skin is removed. The place where skin is removed is called the donor site. The skin is removed by use of a dermatome. A dermatome is a skin-cutting instrument which removes a very thin layer of skin. This is called a split-thickness skin graft. The graft contains the epidermis (top skin layer) and a portion of the dermis. The dermis is the part of the skin which contains blood vessels, nerves, hair follicles, oil and sweat glands. The dermis left behind in the donor area is able to grow back new skin. The donor site will be tender until the new skin grows back. The tenderness comes from the nerves which have been uncovered with the skin removal.  The donor site can be taken from any area of the body. It is usually an area that is hidden by clothes. Common sites are the buttocks or thighs. The site picked depends partly on the visibility of the donor skin and color match. The graft is carefully spread on the bare area to be covered. It is held in place either by gentle pressure from a padded dressing or by a few small stitches. The raw donor area is covered with a sterile nonstick dressing for 3-5 days. This provides comfort and protects the open skin from infection.  For more extensive tissue loss, a full-thickness skin graft may be necessary. This includes the entire thickness of the skin. Common donor sites include flaps from the back or abdominal wall. Full-thickness grafts are used when a lot of tissue is lost. This sometimes happens with open fractures of the lower leg. Full-thickness graft uses both layers (epidermis and dermis) of the skin. Benefits are better contour, more natural color, and less reduction at the grafted site. Disadvantage  of full-thickness skin grafts is that the wound at the donor site is larger and requires more careful management. Sometimes a split-thickness graft must be used to cover the donor site.  Other types of skin grafts include skin taken from a cadaver or from an animal. It is called an allograft when skin is taken from one person and grafted to another person. It is called a xenograft when skin is taken from one species (such as a pig) and transplanted to another species (a human). Disadvantages of these grafts are that the body may reject them. Rejection means that your body tries to get rid of the skin. Artificial skin products are available which can be used and the body will not reject these.  INDICATIONS  Skin grafts may be recommended for:  · Large wounds.  · Surgeries which require skin grafts for healing.  · Any areas of skin loss such as burns.  · Cosmetic reasons on reconstructive surgeries.  · The advantages of rapid skin grafting are that they help prevent fluid loss in the case of burns and they provide a sterile covering which helps prevent infection.  RISKS AND COMPLICATIONS  · Infection.  · Loss of grafted skin.  · Bleeding.  · Reaction to anesthesia.  · Getting an infectious disease if the graft is from someone else (an allograft).  PROGNOSIS   New blood vessels begin growing from the recipient area into the transplanted skin within 36   repeat grafting. Scarring can occur in large wounds. If this is over a joint it can limit the movement of the joint. Poor result from grafting can result in poor cosmetic results. This means it may not look as good as you would like. RECOVERY  Recovery from grafting is usually quick following split thickness grafting. The skin graft must be protected from injury or stretching for 2-3 weeks. Depending on the location of the graft, a dressing may be necessary for 1-2 weeks or  as directed. Avoid all exercises that could stretch or injure the graft for at least one month or as directed by your surgeon. Full-thickness grafts require a longer period of recovery. A hospital stay is most often necessary.  Grafted areas and donor sites should be protected from the sun with clothes or sunscreen. Sun exposure should be avoided until both areas are well healed.  Grafting sites are always a little more likely to get injured than your original equipment. Protect them always. Document Released: 03/09/2005 Document Revised: 10/02/2011 Document Reviewed: 08/26/2007 [employer] Patient Information 2014 [employer].  

## 2013-07-11 NOTE — Telephone Encounter (Signed)
Dr. Daphine Deutscher explained to our surgery schedulers that he will be taking care of this patient's surgery regardless of her financial situation.  I called the patient and explained to her that it has been taken care of and that our surgery schedulers should be calling her sometime next week to schedule her surgery.

## 2013-07-11 NOTE — Telephone Encounter (Signed)
Pt made aware of [facility] financial obligation and policy  Pt currently has no insurance applying for [employer]

## 2013-07-11 NOTE — Progress Notes (Signed)
Chief Complaint:  Left foot granulation tissue  History of Present Illness:  Karen Bennett is an 37 y.o. female referred by Dr. Ardath Sax at the wound center with a left foot that has been operated on by Dr. Otelia Sergeant  for what appears to be A. Diabetic foot with osteomyelitis and probably Charcot changes. She is seen at the wound clinic and is has home health nurse come out and she has a large 4 x 7 cm area of granulation tissue. He has requested that I do a skin graft on this. I explained to the patient that there are risks of this failing that I would be willing to harvest a panel scan probably from her thigh on the left side and then secured that to the left foot. Will see about getting her scheduled after the holidays.  Past Medical History  Diagnosis Date  . Cellulitis   . Diabetes mellitus   . PE (pulmonary embolism) ~2007-2008    Not on anticoagulation    Past Surgical History  Procedure Laterality Date  . Toe amputation      last 2 on L foot  . Surgery to remove hematoma in l leg    . Abscess removal from l groin    . I&d extremity  11/12/2011    Procedure: IRRIGATION AND DEBRIDEMENT EXTREMITY;  Surgeon: Kathryne Hitch, MD;  Location: [facility];  Service: Orthopedics;  Laterality: Left;  foot left  . I&d extremity Left 09/06/2012    Procedure: IRRIGATION AND DEBRIDEMENT EXTREMITY;  Surgeon: Eldred Manges, MD;  Location: [facility];  Service: Orthopedics;  Laterality: Left;  . I&d extremity Left 05/01/2013    Procedure: INCISION AND DRAINAGE LEFT FOREFOOT ABCESS ;  Surgeon: Kerrin Champagne, MD;  Location: [facility];  Service: Orthopedics;  Laterality: Left;  . Application of wound vac Left 05/01/2013    Procedure: APPLICATION OF WOUND VAC;  Surgeon: Kerrin Champagne, MD;  Location: [facility];  Service: Orthopedics;  Laterality: Left;    Current Outpatient Prescriptions  Medication Sig Dispense Refill  . ALPRAZolam (XANAX) 0.25 MG tablet Take one tablet by mouth twice daily as needed  for anxiety  60 tablet  5  . aspirin EC 325 MG tablet Take 1 tablet (325 mg total) by mouth 2 (two) times daily.  30 tablet  0  . atorvastatin (LIPITOR) 40 MG tablet Take 1 tablet (40 mg total) by mouth daily at 6 PM.      . buPROPion (WELLBUTRIN XL) 300 MG 24 hr tablet Take 300 mg by mouth daily.      . clonazePAM (KLONOPIN) 0.5 MG tablet Take 0.5 mg by mouth daily. Take in the AM      . ferrous sulfate 325 (65 FE) MG tablet Take 1 tablet (325 mg total) by mouth 3 (three) times daily after meals.    3  . gabapentin (NEURONTIN) 300 MG capsule Take 1 capsule (300 mg total) by mouth 3 (three) times daily.      Marland Kitchen glucose blood test strip Use as instructed  100 each  12  . glucose monitoring kit (FREESTYLE) monitoring kit 1 each by Does not apply route as needed for other.  1 each  0  . ibuprofen (ADVIL,MOTRIN) 800 MG tablet Take 1 tablet (800 mg total) by mouth every 8 (eight) hours as needed. pain  90 tablet  2  . insulin aspart (NOVOLOG) 100 UNIT/ML injection Inject 5 Units into the skin once.  For blood sugar >150      . insulin glargine (LANTUS) 100 UNIT/ML injection Inject 0.4 mLs (40 Units total) into the skin daily.  10 mL  12  . oxyCODONE (OXY IR/ROXICODONE) 5 MG immediate release tablet 1 by mouth every 4 hours as needed for breakthrough pain  180 tablet  0  . zolpidem (AMBIEN) 5 MG tablet Take 5 mg by mouth at bedtime as needed for sleep.      . OxyCODONE (OXYCONTIN) 20 mg T12A 12 hr tablet Take one tablet by mouth twice daily for pain. Hold for sedation. Do not crush  60 tablet  0  . oxyCODONE-acetaminophen (ROXICET) 5-325 MG per tablet Take 1 tablet by mouth every 4 (four) hours as needed for pain.  180 tablet  0   No current facility-administered medications for this visit.   Zofran; Doxycycline; and Morphine and related Family History  Problem Relation Age of Onset  . Diabetes Mother   . Coronary artery disease Mother   . Stroke Mother    Social History:   reports that she has  never smoked. She has never used smokeless tobacco. She reports that she does not drink alcohol or use illicit drugs.   REVIEW OF SYSTEMS - PERTINENT POSITIVES ONLY: Positive for diabetes mellitus and prior osteomyelitis of the left foot.  Physical Exam:   Blood pressure 126/78, pulse 80, temperature 98 F (36.7 C), resp. rate 18, height 5\' 8"  (1.727 m), weight 375 lb (170.099 kg). Body mass index is 57.03 kg/(m^2).  Gen:  WDWN white female NAD  Neurological: Alert and oriented to person, place, and time. Motor and sensory function is grossly intact  Head: Normocephalic and atraumatic.  Eyes: Conjunctivae are normal. Pupils are equal, round, and reactive to light. No scleral icterus.  Neck: Normal range of motion. Neck supple. No tracheal deviation or thyromegaly present.  Cardiovascular:  SR without murmurs or gallops.  No carotid bruits Respiratory: Effort normal.  No respiratory distress. No chest wall tenderness. Breath sounds normal.  No wheezes, rales or rhonchi.  Abdomen:  nontender and obese GU: Musculoskeletal: Normal range of motion. Extremities are nontender. No cyanosis, edema or clubbing noted Lymphadenopathy: No cervical, preauricular, postauricular or axillary adenopathy is present Skin: Skin is warm and dry. No rash noted. No diaphoresis. No erythema. No pallor. Left foot has marked metatarsal region swelling suggesting a Charcot foot with 3 toes surviving following amputations. See photograph above  Pscyh: Normal mood and affect. Behavior is normal. Judgment and thought content normal.   LABORATORY RESULTS: No results found for this or any previous visit (from the past 48 hour(s)).  RADIOLOGY RESULTS: No results found.  Problem List: Patient Active Problem List   Diagnosis Date Noted  . Anxiety and depression 06/26/2013  . Normocytic anemia 05/22/2013  . Depression 05/22/2013  . Grief reaction 05/22/2013  . Acute renal insufficiency 05/07/2013  . Phantom limb  pain 05/06/2013  . HLD (hyperlipidemia) 05/02/2013  . Diabetic peripheral neuropathy associated with type 2 diabetes mellitus 04/30/2013    Class: Chronic  . MSSA (methicillin susceptible Staphylococcus aureus) 09/06/2012  . Foot osteomyelitis, left 09/03/2012  . Pulmonary emboli 11/11/2011  . Diabetic foot ulcer 09/05/2011  . Obesity, Class III, BMI 40-49.9 (morbid obesity) 08/15/2011  . DM (diabetes mellitus), type 2, uncontrolled with complications 06/12/2007    Assessment & Plan: Open granulating wound on left lateral foot STSG to left foot  Orders in EPIC    Matt B. Daphine Deutscher, MD, FACS  [facility] [phone] beeper [phone]  07/11/2013 11:44 AM

## 2013-07-30 ENCOUNTER — Encounter (HOSPITAL_BASED_OUTPATIENT_CLINIC_OR_DEPARTMENT_OTHER): Payer: Medicaid Other | Attending: General Surgery

## 2013-07-30 DIAGNOSIS — T8789 Other complications of amputation stump: Secondary | ICD-10-CM | POA: Insufficient documentation

## 2013-07-30 DIAGNOSIS — L97509 Non-pressure chronic ulcer of other part of unspecified foot with unspecified severity: Secondary | ICD-10-CM | POA: Insufficient documentation

## 2013-07-30 DIAGNOSIS — E1169 Type 2 diabetes mellitus with other specified complication: Secondary | ICD-10-CM | POA: Insufficient documentation

## 2013-07-30 DIAGNOSIS — E119 Type 2 diabetes mellitus without complications: Secondary | ICD-10-CM | POA: Insufficient documentation

## 2013-07-30 DIAGNOSIS — Y835 Amputation of limb(s) as the cause of abnormal reaction of the patient, or of later complication, without mention of misadventure at the time of the procedure: Secondary | ICD-10-CM | POA: Insufficient documentation

## 2013-08-21 NOTE — Progress Notes (Signed)
[facility]  NAME:  Karen DesanctisCORDIAL, Shamiya               ACCOUNT NO.:  [id number]  MEDICAL RECORD NO.:  [id number]      DATE OF BIRTH:  05-27-1976  PHYSICIAN:  Ardath SaxPeter Emmani Lesueur, M.D.           VISIT DATE:                                  OFFICE VISIT   This is a 38 year old obese woman with diabetes who weighs about 400 pounds.  She has undergone recently an amputation of her left fourth and fifth toes, and had breakdown of the wound.  She has a wound about 10 cm in diameter that were treating with different biologic grafts.  She has no job, is unable to work.  She has had no income whatsoever and I think in light of her wound in her diabetes and her overall status, she needs to be on some sort of government assistance that could maybe be unemployment or temporary disability, but it is going to take a good 3 or 4 months before this is going to be healed and it may require hospitalization and a skin graft, as the wound is quite large and may not respond ordinary treatments of diabetic ulcers, so we are applying for some sort of in-come assistance for this patient.     Ardath SaxPeter Keena Dinse, M.D.     PP/MEDQ  D:  08/20/2013  T:  08/21/2013  Job:  [id number]

## 2013-08-29 ENCOUNTER — Encounter (HOSPITAL_BASED_OUTPATIENT_CLINIC_OR_DEPARTMENT_OTHER): Payer: Medicaid Other | Attending: General Surgery

## 2013-09-01 ENCOUNTER — Ambulatory Visit: Payer: Medicaid Other | Attending: Internal Medicine

## 2013-09-01 DIAGNOSIS — E119 Type 2 diabetes mellitus without complications: Secondary | ICD-10-CM

## 2013-09-01 DIAGNOSIS — Z139 Encounter for screening, unspecified: Secondary | ICD-10-CM

## 2013-09-01 LAB — CBC WITH DIFFERENTIAL/PLATELET
BASOS PCT: 0 % (ref 0–1)
Basophils Absolute: 0 10*3/uL (ref 0.0–0.1)
EOS ABS: 0.3 10*3/uL (ref 0.0–0.7)
EOS PCT: 3 % (ref 0–5)
HEMATOCRIT: 45.2 % (ref 36.0–46.0)
Hemoglobin: 15.7 g/dL — ABNORMAL HIGH (ref 12.0–15.0)
Lymphocytes Relative: 40 % (ref 12–46)
Lymphs Abs: 3.8 10*3/uL (ref 0.7–4.0)
MCH: 29.6 pg (ref 26.0–34.0)
MCHC: 34.7 g/dL (ref 30.0–36.0)
MCV: 85.3 fL (ref 78.0–100.0)
MONO ABS: 0.5 10*3/uL (ref 0.1–1.0)
Monocytes Relative: 5 % (ref 3–12)
Neutro Abs: 4.9 10*3/uL (ref 1.7–7.7)
Neutrophils Relative %: 52 % (ref 43–77)
Platelets: 337 10*3/uL (ref 150–400)
RBC: 5.3 MIL/uL — ABNORMAL HIGH (ref 3.87–5.11)
RDW: 13.5 % (ref 11.5–15.5)
WBC: 9.4 10*3/uL (ref 4.0–10.5)

## 2013-09-01 LAB — LIPID PANEL
CHOL/HDL RATIO: 9.3 ratio
Cholesterol: 327 mg/dL — ABNORMAL HIGH (ref 0–200)
HDL: 35 mg/dL — AB (ref >39)
Triglycerides: 1304 mg/dL — ABNORMAL HIGH (ref <150)

## 2013-09-01 LAB — COMPLETE METABOLIC PANEL WITH GFR
ALBUMIN: 3.9 g/dL (ref 3.5–5.2)
ALK PHOS: 58 U/L (ref 39–117)
ALT: 11 U/L (ref 0–35)
AST: 11 U/L (ref 0–37)
BUN: 15 mg/dL (ref 6–23)
CO2: 25 mEq/L (ref 19–32)
Calcium: 9.5 mg/dL (ref 8.4–10.5)
Chloride: 98 mEq/L (ref 96–112)
Creat: 0.7 mg/dL (ref 0.50–1.10)
GFR, Est African American: >89 mL/min
GFR, Est Non African American: >89 mL/min
Glucose, Bld: 254 mg/dL — ABNORMAL HIGH (ref 70–99)
POTASSIUM: 4.3 meq/L (ref 3.5–5.3)
SODIUM: 135 meq/L (ref 135–145)
Total Bilirubin: 0.6 mg/dL (ref 0.2–1.2)
Total Protein: 7.3 g/dL (ref 6.0–8.3)

## 2013-09-01 LAB — TSH: TSH: 2.492 u[IU]/mL (ref 0.350–4.500)

## 2013-09-02 ENCOUNTER — Telehealth: Payer: Self-pay

## 2013-09-02 LAB — VITAMIN D 25 HYDROXY (VIT D DEFICIENCY, FRACTURES): Vit D, 25-Hydroxy: 22 ng/mL — ABNORMAL LOW (ref 30–89)

## 2013-09-02 NOTE — Telephone Encounter (Signed)
Message copied by Lestine MountJUAREZ, Lukis Bunt L on Tue Sep 02, 2013  2:41 PM ------      Message from: Doris CheadleADVANI, DEEPAK      Created: Tue Sep 02, 2013  9:31 AM       Blood work reviewed, noticed low vitamin D, call patient advise to start ergocalciferol 50,000 units once a week for the duration of  12 weeks.       noticed elevated triglycerides, advise patient for low fat diet. And start taking fenofibrate  145 mg daily, and check with patient if she is taking her Lipitor( should take it daily) .       ------

## 2013-09-02 NOTE — Telephone Encounter (Signed)
Patient not available Left message on voice mail to return our call 

## 2013-09-05 ENCOUNTER — Ambulatory Visit: Payer: Self-pay | Admitting: Internal Medicine

## 2013-09-12 ENCOUNTER — Other Ambulatory Visit: Payer: Self-pay | Admitting: *Deleted

## 2013-09-12 ENCOUNTER — Ambulatory Visit: Payer: Medicaid Other | Attending: Internal Medicine

## 2013-09-17 ENCOUNTER — Other Ambulatory Visit: Payer: Self-pay | Admitting: Internal Medicine

## 2013-09-17 MED ORDER — INSULIN ASPART 100 UNIT/ML ~~LOC~~ SOLN: 5.0000 [IU] | Freq: Once | SUBCUTANEOUS | Status: DC

## 2013-09-17 MED ORDER — INSULIN GLARGINE 100 UNIT/ML ~~LOC~~ SOLN: 40.0000 [IU] | Freq: Every day | SUBCUTANEOUS | Status: DC

## 2013-10-22 ENCOUNTER — Encounter (HOSPITAL_BASED_OUTPATIENT_CLINIC_OR_DEPARTMENT_OTHER): Payer: Medicaid Other | Attending: General Surgery

## 2013-10-22 DIAGNOSIS — I872 Venous insufficiency (chronic) (peripheral): Secondary | ICD-10-CM | POA: Insufficient documentation

## 2013-10-22 DIAGNOSIS — E1169 Type 2 diabetes mellitus with other specified complication: Secondary | ICD-10-CM | POA: Diagnosis not present

## 2013-10-22 DIAGNOSIS — L97809 Non-pressure chronic ulcer of other part of unspecified lower leg with unspecified severity: Secondary | ICD-10-CM | POA: Insufficient documentation

## 2013-10-22 DIAGNOSIS — I890 Lymphedema, not elsewhere classified: Secondary | ICD-10-CM | POA: Insufficient documentation

## 2013-10-22 DIAGNOSIS — L97409 Non-pressure chronic ulcer of unspecified heel and midfoot with unspecified severity: Secondary | ICD-10-CM | POA: Insufficient documentation

## 2013-11-10 ENCOUNTER — Ambulatory Visit: Payer: Medicaid Other | Admitting: Internal Medicine

## 2013-12-03 ENCOUNTER — Encounter (HOSPITAL_BASED_OUTPATIENT_CLINIC_OR_DEPARTMENT_OTHER): Payer: Medicaid Other | Attending: General Surgery

## 2013-12-03 DIAGNOSIS — L97509 Non-pressure chronic ulcer of other part of unspecified foot with unspecified severity: Secondary | ICD-10-CM | POA: Insufficient documentation

## 2013-12-17 DIAGNOSIS — L97509 Non-pressure chronic ulcer of other part of unspecified foot with unspecified severity: Secondary | ICD-10-CM | POA: Diagnosis present

## 2013-12-24 ENCOUNTER — Encounter (HOSPITAL_BASED_OUTPATIENT_CLINIC_OR_DEPARTMENT_OTHER): Payer: Medicaid Other | Attending: General Surgery

## 2013-12-24 DIAGNOSIS — E1169 Type 2 diabetes mellitus with other specified complication: Secondary | ICD-10-CM | POA: Diagnosis not present

## 2013-12-24 DIAGNOSIS — L97509 Non-pressure chronic ulcer of other part of unspecified foot with unspecified severity: Secondary | ICD-10-CM | POA: Insufficient documentation

## 2013-12-31 DIAGNOSIS — E1169 Type 2 diabetes mellitus with other specified complication: Secondary | ICD-10-CM | POA: Diagnosis not present

## 2013-12-31 DIAGNOSIS — L97509 Non-pressure chronic ulcer of other part of unspecified foot with unspecified severity: Secondary | ICD-10-CM | POA: Diagnosis not present

## 2014-01-07 DIAGNOSIS — L97509 Non-pressure chronic ulcer of other part of unspecified foot with unspecified severity: Secondary | ICD-10-CM | POA: Diagnosis not present

## 2014-01-07 DIAGNOSIS — E1169 Type 2 diabetes mellitus with other specified complication: Secondary | ICD-10-CM | POA: Diagnosis not present

## 2014-01-14 DIAGNOSIS — E1169 Type 2 diabetes mellitus with other specified complication: Secondary | ICD-10-CM | POA: Diagnosis not present

## 2014-01-14 DIAGNOSIS — L97509 Non-pressure chronic ulcer of other part of unspecified foot with unspecified severity: Secondary | ICD-10-CM | POA: Diagnosis not present

## 2014-01-21 ENCOUNTER — Encounter (HOSPITAL_BASED_OUTPATIENT_CLINIC_OR_DEPARTMENT_OTHER): Payer: Medicaid Other | Attending: General Surgery

## 2014-01-21 DIAGNOSIS — E1169 Type 2 diabetes mellitus with other specified complication: Secondary | ICD-10-CM | POA: Insufficient documentation

## 2014-01-21 DIAGNOSIS — L97509 Non-pressure chronic ulcer of other part of unspecified foot with unspecified severity: Secondary | ICD-10-CM | POA: Insufficient documentation

## 2014-01-28 DIAGNOSIS — L97509 Non-pressure chronic ulcer of other part of unspecified foot with unspecified severity: Secondary | ICD-10-CM | POA: Diagnosis not present

## 2014-01-28 DIAGNOSIS — E1169 Type 2 diabetes mellitus with other specified complication: Secondary | ICD-10-CM | POA: Diagnosis not present

## 2014-02-04 DIAGNOSIS — E1169 Type 2 diabetes mellitus with other specified complication: Secondary | ICD-10-CM | POA: Diagnosis not present

## 2014-02-04 DIAGNOSIS — L97509 Non-pressure chronic ulcer of other part of unspecified foot with unspecified severity: Secondary | ICD-10-CM | POA: Diagnosis not present

## 2014-02-11 DIAGNOSIS — L97509 Non-pressure chronic ulcer of other part of unspecified foot with unspecified severity: Secondary | ICD-10-CM | POA: Diagnosis not present

## 2014-02-11 DIAGNOSIS — E1169 Type 2 diabetes mellitus with other specified complication: Secondary | ICD-10-CM | POA: Diagnosis not present

## 2014-02-18 DIAGNOSIS — L97509 Non-pressure chronic ulcer of other part of unspecified foot with unspecified severity: Secondary | ICD-10-CM | POA: Diagnosis not present

## 2014-02-18 DIAGNOSIS — E1169 Type 2 diabetes mellitus with other specified complication: Secondary | ICD-10-CM | POA: Diagnosis not present

## 2014-02-25 ENCOUNTER — Encounter (HOSPITAL_BASED_OUTPATIENT_CLINIC_OR_DEPARTMENT_OTHER): Payer: Medicaid Other | Attending: General Surgery

## 2014-02-25 DIAGNOSIS — E1169 Type 2 diabetes mellitus with other specified complication: Secondary | ICD-10-CM | POA: Insufficient documentation

## 2014-02-25 DIAGNOSIS — L97509 Non-pressure chronic ulcer of other part of unspecified foot with unspecified severity: Secondary | ICD-10-CM | POA: Insufficient documentation

## 2014-03-06 DIAGNOSIS — L97509 Non-pressure chronic ulcer of other part of unspecified foot with unspecified severity: Secondary | ICD-10-CM | POA: Diagnosis not present

## 2014-03-06 DIAGNOSIS — E1169 Type 2 diabetes mellitus with other specified complication: Secondary | ICD-10-CM | POA: Diagnosis present

## 2014-03-20 DIAGNOSIS — E1169 Type 2 diabetes mellitus with other specified complication: Secondary | ICD-10-CM | POA: Diagnosis not present

## 2014-03-20 DIAGNOSIS — L97509 Non-pressure chronic ulcer of other part of unspecified foot with unspecified severity: Secondary | ICD-10-CM | POA: Diagnosis not present

## 2014-03-27 ENCOUNTER — Encounter (HOSPITAL_BASED_OUTPATIENT_CLINIC_OR_DEPARTMENT_OTHER): Payer: Medicaid Other | Attending: General Surgery

## 2014-03-27 DIAGNOSIS — L97509 Non-pressure chronic ulcer of other part of unspecified foot with unspecified severity: Secondary | ICD-10-CM | POA: Insufficient documentation

## 2014-03-27 DIAGNOSIS — E1169 Type 2 diabetes mellitus with other specified complication: Secondary | ICD-10-CM | POA: Insufficient documentation

## 2014-04-17 DIAGNOSIS — L97509 Non-pressure chronic ulcer of other part of unspecified foot with unspecified severity: Secondary | ICD-10-CM | POA: Diagnosis not present

## 2014-04-17 DIAGNOSIS — E1169 Type 2 diabetes mellitus with other specified complication: Secondary | ICD-10-CM | POA: Diagnosis not present

## 2014-04-24 ENCOUNTER — Encounter (HOSPITAL_BASED_OUTPATIENT_CLINIC_OR_DEPARTMENT_OTHER): Payer: Medicaid Other | Attending: General Surgery

## 2014-04-24 DIAGNOSIS — E11621 Type 2 diabetes mellitus with foot ulcer: Secondary | ICD-10-CM | POA: Diagnosis present

## 2014-04-24 DIAGNOSIS — L97529 Non-pressure chronic ulcer of other part of left foot with unspecified severity: Secondary | ICD-10-CM | POA: Diagnosis not present

## 2014-10-05 ENCOUNTER — Other Ambulatory Visit: Payer: Self-pay | Admitting: Internal Medicine

## 2015-04-19 DIAGNOSIS — Z91199 Patient's noncompliance with other medical treatment and regimen due to unspecified reason: Secondary | ICD-10-CM

## 2015-08-21 IMAGING — CT CT FOOT*L* W/CM
3 of 4 series · 15 of 27 positions shown, 17 images · IV contrast (omnipaque)
Comparison: CT scan dated 09/03/2012 and radiographs dated
04/30/2013

CLINICAL DATA: Persistent soft tissue swelling of the foot. Recent
resection of soft tissue mass and of the base of the 5th metatarsal.
Previous incision and drainage of abscess.

EXAM:
CT OF THE LEFT FOOT WITH CONTRAST
TECHNIQUE: Multidetector CT imaging was performed following the standard
protocol during bolus administration of intravenous contrast.
CONTRAST:  100mL OMNIPAQUE IOHEXOL 300 MG/ML  SOLN

[Series 5: foot st · axial · 0.35mm/px · z∈[+48,+204]mm · 5 of 118 slices shown, 7 images]
[im 20/118  soft-tissue]
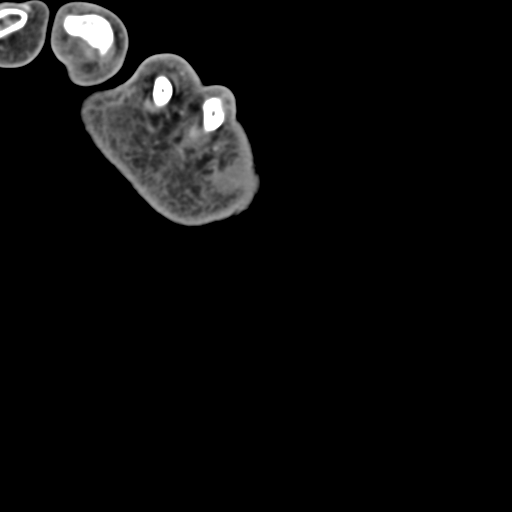
[im 20/118  bone]
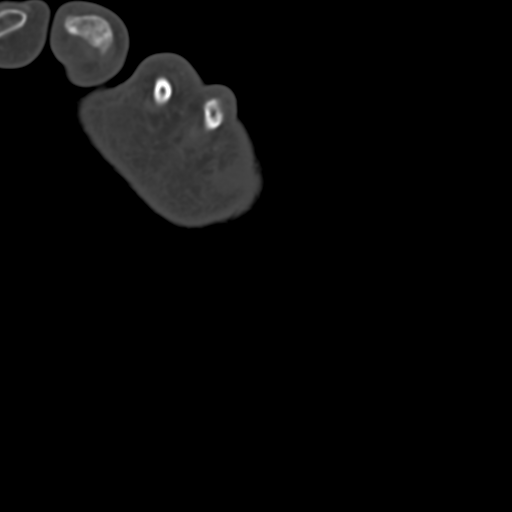
[im 40/118  bone]
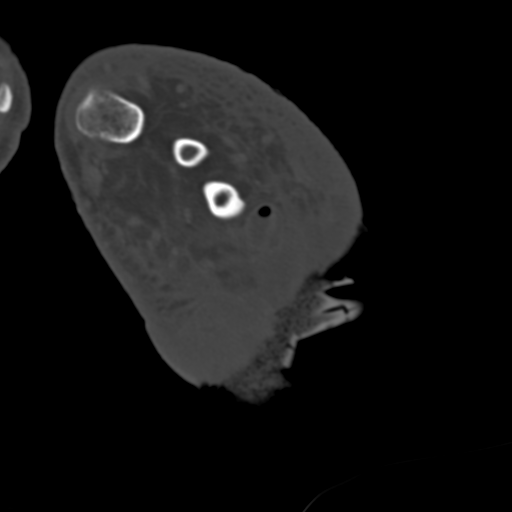
[im 59/118  bone]
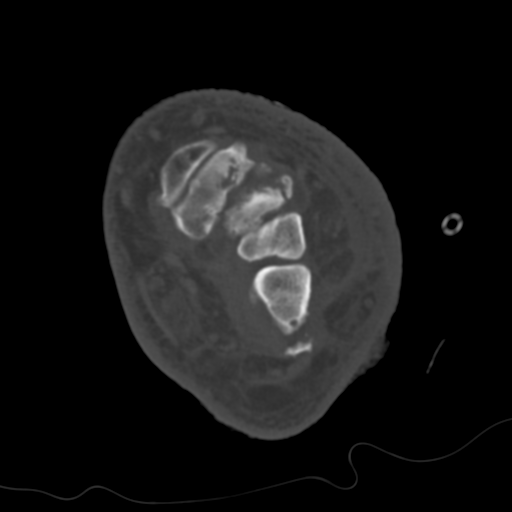
[im 79/118  bone]
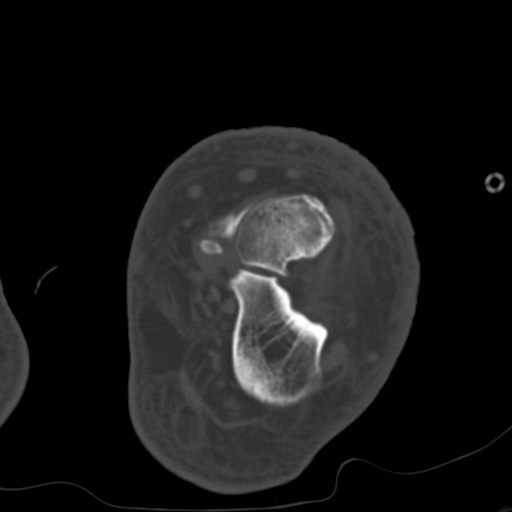
[im 98/118  soft-tissue]
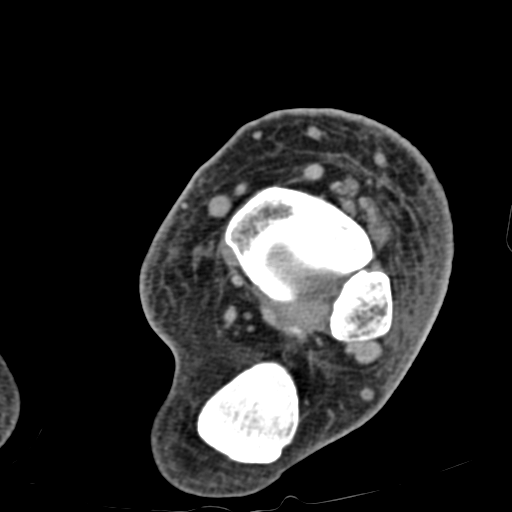
[im 98/118  bone]
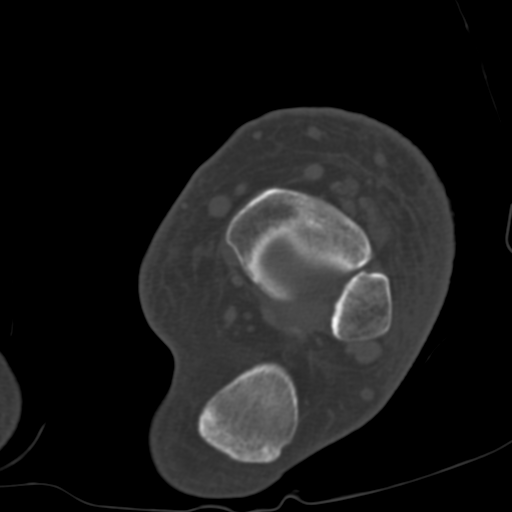

[Series 604: <mpr thick range(2)> · axial · 0.46mm/px · z∈[+113,+255]mm · 5 of 127 slices shown]
[im 22/127  bone]
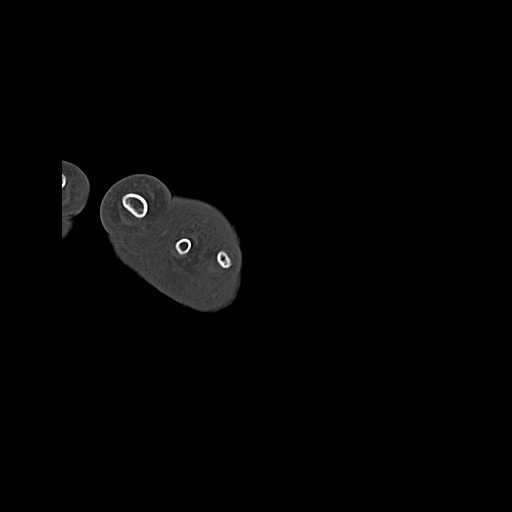
[im 43/127  bone]
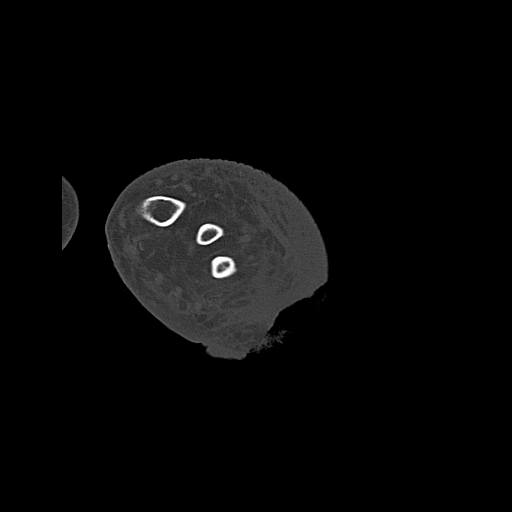
[im 64/127  bone]
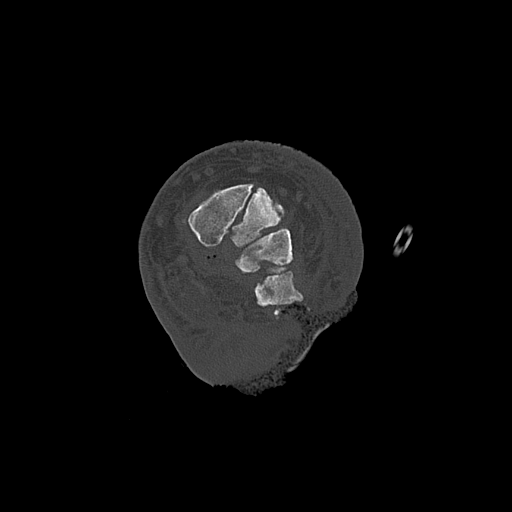
[im 85/127  bone]
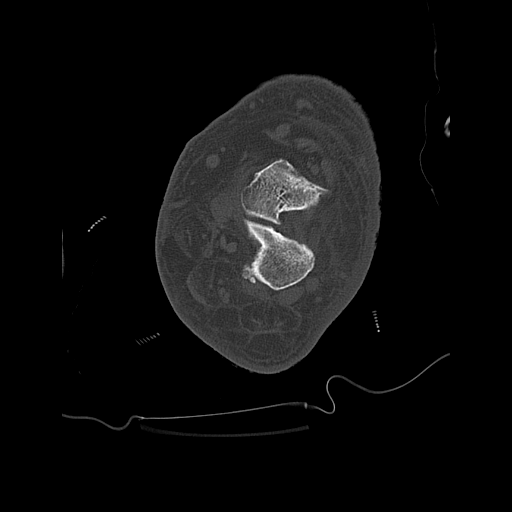
[im 106/127  bone]
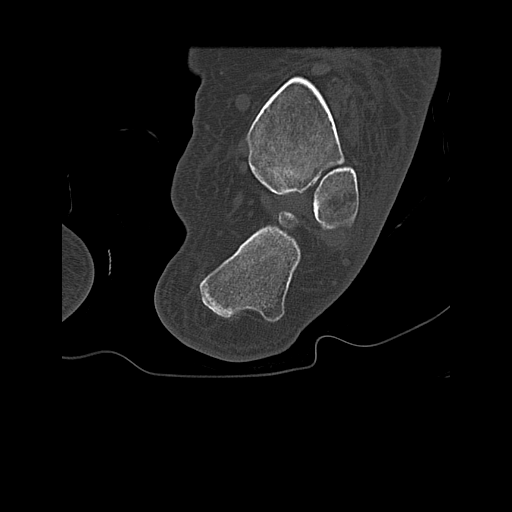

[Series 606: <mpr thick range(4)> · sagittal · 0.46mm/px · 5 of 90 slices shown]
[im 13/90  bone]
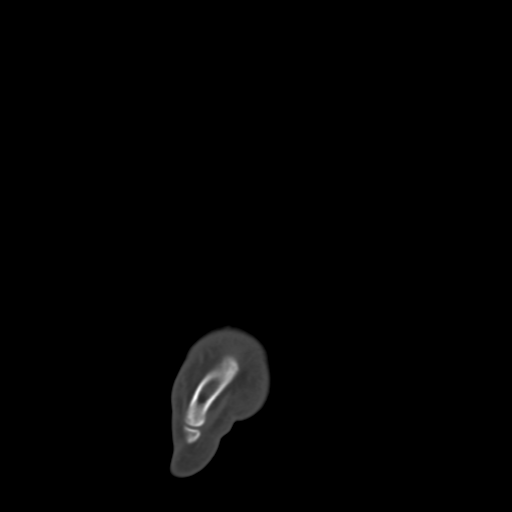
[im 26/90  bone]
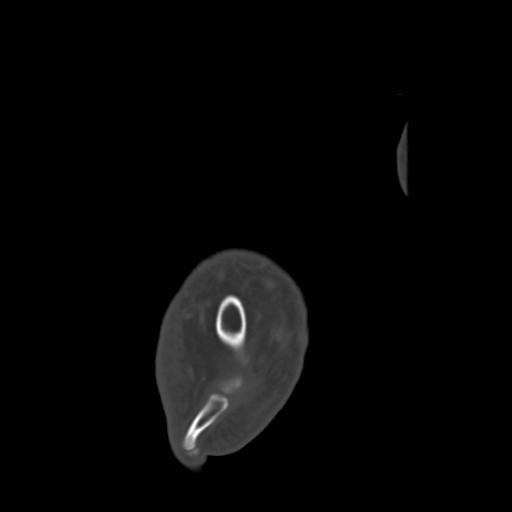
[im 39/90  bone]
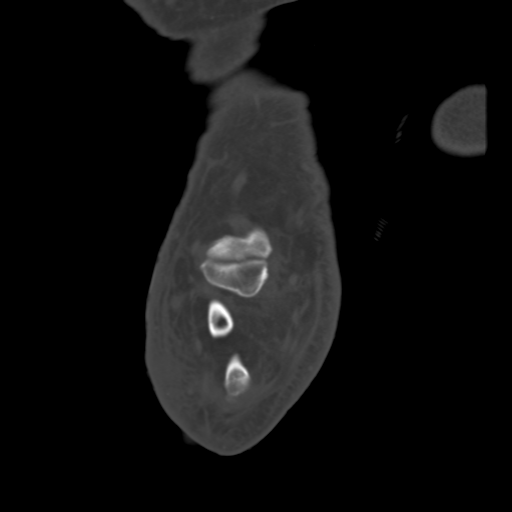
[im 51/90  bone]
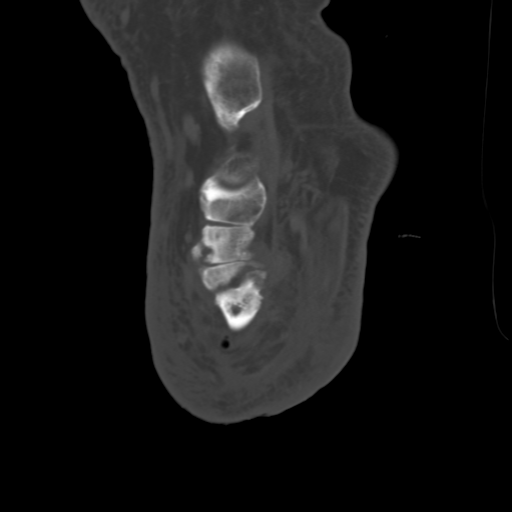
[im 64/90  bone]
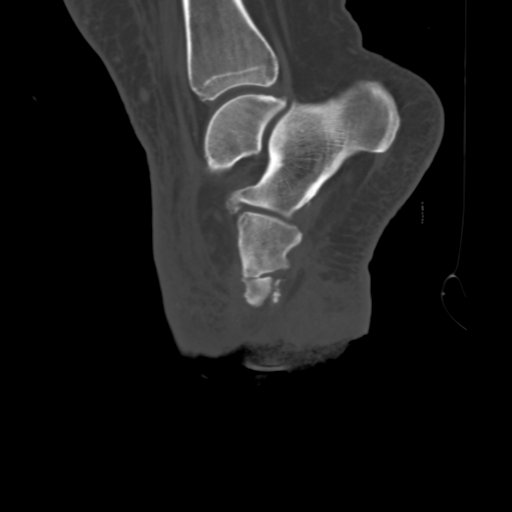

[15 of 27 positions shown; findings below may reference images not displayed]

FINDINGS: There are multiple soft tissue abscesses in the midfoot including
around the stump of the 4th metatarsal. There is also fluid in the
joints between the bases of the 1st and 2nd, 2nd and 3rd and 3rd and
4th metatarsals which I think is pus. There is a pus in the soft
tissues along the plantar aspect of the proximal metatarsals. There
is also pus in the plantar lateral aspect of the foot adjacent to
the soft tissue resection. The area of involvement is approximately
4 cm in diameter.

There are new erosive changes of the basilar 2nd metatarsal since
the CT scan of 09/03/2012 and there is increased irregularity of the
bases of the 3rd and 4th metatarsals as well as of the medial and
middle cuneiforms.
IMPRESSION: 1. Multiple soft tissue abscesses in around the tarsometatarsal
joints. Findings consistent with septic joints between the proximal
metatarsals.
2. New degenerative and erosive changes in several bones which could
be arthritic but I suspect are more likely related to infection.

## 2016-06-21 DIAGNOSIS — L680 Hirsutism: Secondary | ICD-10-CM | POA: Insufficient documentation

## 2017-12-31 DIAGNOSIS — S72401A Unspecified fracture of lower end of right femur, initial encounter for closed fracture: Secondary | ICD-10-CM | POA: Insufficient documentation

## 2017-12-31 DIAGNOSIS — Z720 Tobacco use: Secondary | ICD-10-CM | POA: Diagnosis present

## 2017-12-31 DIAGNOSIS — Z89511 Acquired absence of right leg below knee: Secondary | ICD-10-CM

## 2020-11-12 DIAGNOSIS — Z86718 Personal history of other venous thrombosis and embolism: Secondary | ICD-10-CM | POA: Insufficient documentation

## 2020-12-26 ENCOUNTER — Inpatient Hospital Stay (HOSPITAL_COMMUNITY)
Admission: EM | Admit: 2020-12-26 | Discharge: 2021-01-05 | DRG: 622 | Disposition: A | Discharge status: Home Health | Payer: Medicare Other | Attending: Internal Medicine | Admitting: Internal Medicine

## 2020-12-26 ENCOUNTER — Other Ambulatory Visit: Payer: Self-pay

## 2020-12-26 ENCOUNTER — Emergency Department (HOSPITAL_COMMUNITY): Payer: Medicare Other

## 2020-12-26 ENCOUNTER — Encounter (HOSPITAL_COMMUNITY): Payer: Self-pay

## 2020-12-26 DIAGNOSIS — N179 Acute kidney failure, unspecified: Secondary | ICD-10-CM | POA: Diagnosis present

## 2020-12-26 DIAGNOSIS — E118 Type 2 diabetes mellitus with unspecified complications: Secondary | ICD-10-CM | POA: Diagnosis not present

## 2020-12-26 DIAGNOSIS — M726 Necrotizing fasciitis: Secondary | ICD-10-CM | POA: Diagnosis present

## 2020-12-26 DIAGNOSIS — Z1611 Resistance to penicillins: Secondary | ICD-10-CM | POA: Diagnosis present

## 2020-12-26 DIAGNOSIS — Z9114 Patient's other noncompliance with medication regimen: Secondary | ICD-10-CM

## 2020-12-26 DIAGNOSIS — L89312 Pressure ulcer of right buttock, stage 2: Secondary | ICD-10-CM | POA: Diagnosis present

## 2020-12-26 DIAGNOSIS — I330 Acute and subacute infective endocarditis: Secondary | ICD-10-CM | POA: Diagnosis present

## 2020-12-26 DIAGNOSIS — I708 Atherosclerosis of other arteries: Secondary | ICD-10-CM | POA: Diagnosis present

## 2020-12-26 DIAGNOSIS — Z881 Allergy status to other antibiotic agents status: Secondary | ICD-10-CM

## 2020-12-26 DIAGNOSIS — Z885 Allergy status to narcotic agent status: Secondary | ICD-10-CM

## 2020-12-26 DIAGNOSIS — L8990 Pressure ulcer of unspecified site, unspecified stage: Secondary | ICD-10-CM | POA: Insufficient documentation

## 2020-12-26 DIAGNOSIS — Z79899 Other long term (current) drug therapy: Secondary | ICD-10-CM

## 2020-12-26 DIAGNOSIS — Z20822 Contact with and (suspected) exposure to covid-19: Secondary | ICD-10-CM | POA: Diagnosis present

## 2020-12-26 DIAGNOSIS — Z794 Long term (current) use of insulin: Secondary | ICD-10-CM

## 2020-12-26 DIAGNOSIS — E871 Hypo-osmolality and hyponatremia: Secondary | ICD-10-CM | POA: Diagnosis present

## 2020-12-26 DIAGNOSIS — Z833 Family history of diabetes mellitus: Secondary | ICD-10-CM

## 2020-12-26 DIAGNOSIS — IMO0002 Reserved for concepts with insufficient information to code with codable children: Secondary | ICD-10-CM | POA: Diagnosis present

## 2020-12-26 DIAGNOSIS — B376 Candidal endocarditis: Secondary | ICD-10-CM

## 2020-12-26 DIAGNOSIS — E119 Type 2 diabetes mellitus without complications: Secondary | ICD-10-CM | POA: Diagnosis present

## 2020-12-26 DIAGNOSIS — E11628 Type 2 diabetes mellitus with other skin complications: Secondary | ICD-10-CM | POA: Diagnosis not present

## 2020-12-26 DIAGNOSIS — L02416 Cutaneous abscess of left lower limb: Secondary | ICD-10-CM | POA: Diagnosis present

## 2020-12-26 DIAGNOSIS — Z7901 Long term (current) use of anticoagulants: Secondary | ICD-10-CM

## 2020-12-26 DIAGNOSIS — Z6841 Body Mass Index (BMI) 40.0 and over, adult: Secondary | ICD-10-CM

## 2020-12-26 DIAGNOSIS — B372 Candidiasis of skin and nail: Secondary | ICD-10-CM | POA: Diagnosis present

## 2020-12-26 DIAGNOSIS — Z86711 Personal history of pulmonary embolism: Secondary | ICD-10-CM

## 2020-12-26 DIAGNOSIS — B377 Candidal sepsis: Secondary | ICD-10-CM | POA: Diagnosis not present

## 2020-12-26 DIAGNOSIS — T383X6A Underdosing of insulin and oral hypoglycemic [antidiabetic] drugs, initial encounter: Secondary | ICD-10-CM | POA: Diagnosis present

## 2020-12-26 DIAGNOSIS — L03116 Cellulitis of left lower limb: Secondary | ICD-10-CM | POA: Diagnosis present

## 2020-12-26 DIAGNOSIS — Z7982 Long term (current) use of aspirin: Secondary | ICD-10-CM

## 2020-12-26 DIAGNOSIS — Z86718 Personal history of other venous thrombosis and embolism: Secondary | ICD-10-CM

## 2020-12-26 DIAGNOSIS — B961 Klebsiella pneumoniae [K. pneumoniae] as the cause of diseases classified elsewhere: Secondary | ICD-10-CM | POA: Diagnosis present

## 2020-12-26 DIAGNOSIS — L0390 Cellulitis, unspecified: Secondary | ICD-10-CM | POA: Diagnosis present

## 2020-12-26 DIAGNOSIS — F419 Anxiety disorder, unspecified: Secondary | ICD-10-CM | POA: Diagnosis not present

## 2020-12-26 DIAGNOSIS — E1140 Type 2 diabetes mellitus with diabetic neuropathy, unspecified: Secondary | ICD-10-CM | POA: Diagnosis present

## 2020-12-26 DIAGNOSIS — Z8249 Family history of ischemic heart disease and other diseases of the circulatory system: Secondary | ICD-10-CM

## 2020-12-26 DIAGNOSIS — E785 Hyperlipidemia, unspecified: Secondary | ICD-10-CM | POA: Diagnosis present

## 2020-12-26 DIAGNOSIS — Z7984 Long term (current) use of oral hypoglycemic drugs: Secondary | ICD-10-CM

## 2020-12-26 DIAGNOSIS — I152 Hypertension secondary to endocrine disorders: Secondary | ICD-10-CM

## 2020-12-26 DIAGNOSIS — F32A Depression, unspecified: Secondary | ICD-10-CM | POA: Diagnosis present

## 2020-12-26 DIAGNOSIS — E1165 Type 2 diabetes mellitus with hyperglycemia: Secondary | ICD-10-CM | POA: Diagnosis present

## 2020-12-26 DIAGNOSIS — Z888 Allergy status to other drugs, medicaments and biological substances status: Secondary | ICD-10-CM

## 2020-12-26 DIAGNOSIS — Z89511 Acquired absence of right leg below knee: Secondary | ICD-10-CM

## 2020-12-26 DIAGNOSIS — I10 Essential (primary) hypertension: Secondary | ICD-10-CM | POA: Diagnosis present

## 2020-12-26 DIAGNOSIS — F17210 Nicotine dependence, cigarettes, uncomplicated: Secondary | ICD-10-CM | POA: Diagnosis present

## 2020-12-26 LAB — CBC WITH DIFFERENTIAL/PLATELET
Abs Immature Granulocytes: 0.05 10*3/uL (ref 0.00–0.07)
Basophils Absolute: 0 10*3/uL (ref 0.0–0.1)
Basophils Relative: 1 %
Eosinophils Absolute: 0.1 10*3/uL (ref 0.0–0.5)
Eosinophils Relative: 1 %
HCT: 38.3 % (ref 36.0–46.0)
Hemoglobin: 12.9 g/dL (ref 12.0–15.0)
Immature Granulocytes: 1 %
Lymphocytes Relative: 19 %
Lymphs Abs: 1.3 10*3/uL (ref 0.7–4.0)
MCH: 29.2 pg (ref 26.0–34.0)
MCHC: 33.7 g/dL (ref 30.0–36.0)
MCV: 86.7 fL (ref 80.0–100.0)
Monocytes Absolute: 0.6 10*3/uL (ref 0.1–1.0)
Monocytes Relative: 9 %
Neutro Abs: 4.8 10*3/uL (ref 1.7–7.7)
Neutrophils Relative %: 69 %
Platelets: 263 10*3/uL (ref 150–400)
RBC: 4.42 MIL/uL (ref 3.87–5.11)
RDW: 13 % (ref 11.5–15.5)
WBC: 6.9 10*3/uL (ref 4.0–10.5)
nRBC: 0 % (ref 0.0–0.2)

## 2020-12-26 LAB — COMPREHENSIVE METABOLIC PANEL
ALT: 10 U/L (ref 0–44)
AST: 8 U/L — ABNORMAL LOW (ref 15–41)
Albumin: 2.2 g/dL — ABNORMAL LOW (ref 3.5–5.0)
Alkaline Phosphatase: 77 U/L (ref 38–126)
Anion gap: 8 (ref 5–15)
BUN: 20 mg/dL (ref 6–20)
CO2: 23 mmol/L (ref 22–32)
Calcium: 8.3 mg/dL — ABNORMAL LOW (ref 8.9–10.3)
Chloride: 90 mmol/L — ABNORMAL LOW (ref 98–111)
Creatinine, Ser: 1.04 mg/dL — ABNORMAL HIGH (ref 0.44–1.00)
GFR, Estimated: >60 mL/min (ref >60)
Glucose, Bld: 648 mg/dL (ref 70–99)
Potassium: 4 mmol/L (ref 3.5–5.1)
Sodium: 121 mmol/L — ABNORMAL LOW (ref 135–145)
Total Bilirubin: 0.6 mg/dL (ref 0.3–1.2)
Total Protein: 7.2 g/dL (ref 6.5–8.1)

## 2020-12-26 LAB — LACTIC ACID, PLASMA: Lactic Acid, Venous: 1.1 mmol/L (ref 0.5–1.9)

## 2020-12-26 LAB — CBG MONITORING, ED: Glucose-Capillary: 589 mg/dL (ref 70–99)

## 2020-12-26 MED ORDER — POTASSIUM CHLORIDE 10 MEQ/100ML IV SOLN
10.0000 meq | INTRAVENOUS | Status: AC
  Administered 2020-12-27 (×2): 10 meq via INTRAVENOUS
  Filled 2020-12-26 (×2): qty 100

## 2020-12-26 MED ORDER — PIPERACILLIN-TAZOBACTAM 3.375 G IVPB 30 MIN
3.3750 g | Freq: Once | INTRAVENOUS | Status: AC
  Administered 2020-12-26: 3.375 g via INTRAVENOUS
  Filled 2020-12-26: qty 50

## 2020-12-26 MED ORDER — SODIUM CHLORIDE (PF) 0.9 % IJ SOLN
INTRAMUSCULAR | Status: AC
  Filled 2020-12-26: qty 50

## 2020-12-26 MED ORDER — SODIUM CHLORIDE 0.9 % IV BOLUS
1000.0000 mL | Freq: Once | INTRAVENOUS | Status: AC
  Administered 2020-12-26: 1000 mL via INTRAVENOUS

## 2020-12-26 MED ORDER — FENTANYL CITRATE (PF) 100 MCG/2ML IJ SOLN
100.0000 ug | Freq: Once | INTRAMUSCULAR | Status: AC
  Administered 2020-12-26: 100 ug via INTRAVENOUS
  Filled 2020-12-26: qty 2

## 2020-12-26 MED ORDER — LACTATED RINGERS IV SOLN: INTRAVENOUS | Status: DC

## 2020-12-26 MED ORDER — DIPHENHYDRAMINE HCL 25 MG PO CAPS
50.0000 mg | ORAL_CAPSULE | Freq: Once | ORAL | Status: AC
  Administered 2020-12-26: 50 mg via ORAL
  Filled 2020-12-26: qty 2

## 2020-12-26 MED ORDER — VANCOMYCIN HCL 2000 MG/400ML IV SOLN
2000.0000 mg | Freq: Once | INTRAVENOUS | Status: AC
  Administered 2020-12-26: 2000 mg via INTRAVENOUS
  Filled 2020-12-26: qty 400

## 2020-12-26 MED ORDER — DEXTROSE IN LACTATED RINGERS 5 % IV SOLN: INTRAVENOUS | Status: DC

## 2020-12-26 MED ORDER — VANCOMYCIN HCL 10 G IV SOLR
2000.0000 mg | Freq: Once | INTRAVENOUS | Status: DC
  Filled 2020-12-26: qty 2000

## 2020-12-26 MED ORDER — LACTATED RINGERS IV BOLUS
20.0000 mL/kg | Freq: Once | INTRAVENOUS | Status: AC
  Administered 2020-12-27: 3646 mL via INTRAVENOUS

## 2020-12-26 MED ORDER — DEXTROSE 50 % IV SOLN: 0.0000 mL | INTRAVENOUS | Status: DC | PRN

## 2020-12-26 MED ORDER — METOCLOPRAMIDE HCL 5 MG/ML IJ SOLN
10.0000 mg | Freq: Once | INTRAMUSCULAR | Status: AC
  Administered 2020-12-26: 10 mg via INTRAVENOUS
  Filled 2020-12-26: qty 2

## 2020-12-26 MED ORDER — INSULIN ASPART 100 UNIT/ML IJ SOLN
8.0000 [IU] | Freq: Once | INTRAMUSCULAR | Status: AC
  Administered 2020-12-26: 8 [IU] via INTRAVENOUS
  Filled 2020-12-26: qty 0.08

## 2020-12-26 MED ORDER — IOHEXOL 300 MG/ML  SOLN
100.0000 mL | Freq: Once | INTRAMUSCULAR | Status: AC | PRN
  Administered 2020-12-26: 100 mL via INTRAVENOUS

## 2020-12-26 MED ORDER — INSULIN REGULAR(HUMAN) IN NACL 100-0.9 UT/100ML-% IV SOLN
INTRAVENOUS | Status: DC
  Administered 2020-12-27: 15 [IU]/h via INTRAVENOUS
  Filled 2020-12-26 (×2): qty 100

## 2020-12-26 NOTE — Progress Notes (Signed)
A consult was received from an ED provider for vancomycin and Zosyn per pharmacy dosing.  The patient's profile has been reviewed for ht/wt/allergies/indication/available labs.    A one time order has been placed for Zosyn 3.375 g IV and vancomycin 2000 mg IV.  Further antibiotics/pharmacy consults should be ordered by admitting physician if indicated.                       Thank you, Royce Macadamia, PharmD, BCPS 12/26/2020  7:32 PM

## 2020-12-26 NOTE — ED Provider Notes (Signed)
[facility] DEPT Provider Note   CSN: [id number] Arrival date & time: 12/26/20  1826     History No chief complaint on file.   Karen Bennett is a 45 y.o. female presenting for evaluation of L leg pain.   Pt states she noticed a bruise on her L lateral leg for the past few days. Last night the area developed a blister that popped and was full of blood. Today there was a second lesion that developed and popped. She reports increasing pain and swelling to the area. She denies fevers, but reports feeling generally ill and with associated nausea and vomiting. She has a h/o DM, but has not been checking her BGLs recently. She is s/p R BKA several years ago. She denies recent abx use. She has not had any medication for pain yet today.   HPI     Past Medical History:  Diagnosis Date  . Cellulitis   . Diabetes mellitus   . PE (pulmonary embolism) ~2007-2008   Not on anticoagulation    Patient Active Problem List   Diagnosis Date Noted  . Anxiety and depression 06/26/2013  . Normocytic anemia 05/22/2013  . Depression 05/22/2013  . Grief reaction 05/22/2013  . Acute renal insufficiency 05/07/2013  . Phantom limb pain ([place]) 05/06/2013  . HLD (hyperlipidemia) 05/02/2013  . Diabetic peripheral neuropathy associated with type 2 diabetes mellitus ([place]) 04/30/2013    Class: Chronic  . MSSA (methicillin susceptible Staphylococcus aureus) 09/06/2012  . Foot osteomyelitis, left ([place]) 09/03/2012  . Pulmonary emboli ([place]) 11/11/2011  . Diabetic foot ulcer ([place]) 09/05/2011  . Obesity, Class III, BMI 40-49.9 (morbid obesity) ([place]) 08/15/2011  . DM (diabetes mellitus), type 2, uncontrolled with complications ([place]) 81/82/9937    Past Surgical History:  Procedure Laterality Date  . Abscess removal from L groin    . APPLICATION OF WOUND VAC Left 05/01/2013   Procedure: APPLICATION OF WOUND VAC;  Surgeon: Jessy Oto, MD;  Location: [facility];  Service: Orthopedics;   Laterality: Left;  . I & D EXTREMITY  11/12/2011   Procedure: IRRIGATION AND DEBRIDEMENT EXTREMITY;  Surgeon: Mcarthur Rossetti, MD;  Location: [facility];  Service: Orthopedics;  Laterality: Left;  foot left  . I & D EXTREMITY Left 09/06/2012   Procedure: IRRIGATION AND DEBRIDEMENT EXTREMITY;  Surgeon: Marybelle Killings, MD;  Location: [facility];  Service: Orthopedics;  Laterality: Left;  . I & D EXTREMITY Left 05/01/2013   Procedure: INCISION AND DRAINAGE LEFT FOREFOOT ABCESS ;  Surgeon: Jessy Oto, MD;  Location: [facility];  Service: Orthopedics;  Laterality: Left;  . Surgery to remove hematoma in L leg    . 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     OB History   No obstetric history on file.     Family History  Problem Relation Age of Onset  . Diabetes Mother   . Coronary artery disease Mother   . Stroke Mother     Social History   Tobacco Use  . Smoking status: Never Smoker  . Smokeless tobacco: Never Used  Substance Use Topics  . Alcohol use: No  . Drug use: No    Home Medications Prior to Admission medications   Medication Sig Start Date End Date Taking? Authorizing Provider  ALPRAZolam Duanne Moron) 0.25 MG tablet Take one tablet by mouth twice daily as needed for anxiety 05/13/13   Blanchie Serve, MD  aspirin EC 325 MG tablet Take 1  tablet (325 mg total) by mouth 2 (two) times daily. 05/12/13   Jessy Oto, MD  atorvastatin (LIPITOR) 40 MG tablet Take 1 tablet (40 mg total) by mouth daily at 6 PM. 05/12/13   Buriev, Arie Sabina, MD  buPROPion (WELLBUTRIN XL) 300 MG 24 hr tablet Take 300 mg by mouth daily.    [provider]  clonazePAM (KLONOPIN) 0.5 MG tablet Take 0.5 mg by mouth daily. Take in the AM    [provider]  ferrous sulfate 325 (65 FE) MG tablet Take 1 tablet (325 mg total) by mouth 3 (three) times daily after meals. 05/12/13   Kinnie Feil, MD  gabapentin (NEURONTIN) 300 MG capsule Take 1 capsule (300 mg total) by mouth 3 (three) times daily.  05/12/13   Kinnie Feil, MD  glucose blood test strip Use as instructed 06/26/13   Advani, Vernon Prey, MD  glucose monitoring kit (FREESTYLE) monitoring kit 1 each by Does not apply route as needed for other. 06/26/13   Lorayne Marek, MD  ibuprofen (ADVIL,MOTRIN) 800 MG tablet Take 1 tablet (800 mg total) by mouth every 8 (eight) hours as needed. pain 06/26/13   Lorayne Marek, MD  insulin aspart (NOVOLOG) 100 UNIT/ML injection Inject 5 Units into the skin once. For blood sugar >150 09/17/13   Jegede, Olugbemiga E, MD  insulin glargine (LANTUS) 100 UNIT/ML injection Inject 0.4 mLs (40 Units total) into the skin daily. 09/17/13   Tresa Garter, MD  oxyCODONE (OXY IR/ROXICODONE) 5 MG immediate release tablet 1 by mouth every 4 hours as needed for breakthrough pain 06/16/13   Reed, Tiffany L, DO  OxyCODONE (OXYCONTIN) 20 mg T12A 12 hr tablet Take one tablet by mouth twice daily for pain. Hold for sedation. Do not crush 06/24/13   Blanchie Serve, MD  oxyCODONE-acetaminophen (ROXICET) 5-325 MG per tablet Take 1 tablet by mouth every 4 (four) hours as needed for pain. 05/21/13   Estill Dooms, MD  zolpidem (AMBIEN) 5 MG tablet Take 5 mg by mouth at bedtime as needed for sleep.    [provider]    Allergies    Zofran, Doxycycline, and Morphine and related  Review of Systems   Review of Systems  Gastrointestinal: Positive for nausea.  Skin: Positive for color change and wound.  Neurological: Positive for weakness.  All other systems reviewed and are negative.   Physical Exam Updated Vital Signs BP (!) 166/62   Pulse 79   Temp 99.1 F (37.3 C) (Oral)   Resp 18   Ht _0  (1.676 m)   Wt (!) 182.3 kg   SpO2 96%   BMI 64.88 kg/m   Physical Exam Vitals and nursing note reviewed.  Constitutional:      General: She is not in acute distress.    Appearance: She is well-developed. She is obese.     Comments: Appears as if she feel ill, in NAD  HENT:     Head: Normocephalic  and atraumatic.  Eyes:     Conjunctiva/sclera: Conjunctivae normal.     Pupils: Pupils are equal, round, and reactive to light.  Cardiovascular:     Rate and Rhythm: Normal rate and regular rhythm.     Pulses: Normal pulses.  Pulmonary:     Effort: Pulmonary effort is normal. No respiratory distress.     Breath sounds: Normal breath sounds. No wheezing.  Abdominal:     General: There is no distension.     Palpations: Abdomen is  soft. There is no mass.     Tenderness: There is no abdominal tenderness. There is no guarding or rebound.  Musculoskeletal:     Cervical back: Normal range of motion and neck supple.       Legs:     Comments: Large area of redness and induration with 2 cental lesions draining minimal purlent material. Very ttp. No streaking. No crepitus   S/p R BKA  Skin:    General: Skin is warm and dry.     Capillary Refill: Capillary refill takes less than 2 seconds.  Neurological:     Mental Status: She is alert and oriented to person, place, and time.     ED Results / Procedures / Treatments   Labs (all labs ordered are listed, but only abnormal results are displayed) Labs Reviewed  COMPREHENSIVE METABOLIC PANEL - Abnormal; Notable for the following components:      Result Value   Sodium 121 (*)    Chloride 90 (*)    Glucose, Bld 648 (*)    Creatinine, Ser 1.04 (*)    Calcium 8.3 (*)    Albumin 2.2 (*)    AST 8 (*)    All other components within normal limits  CBG MONITORING, ED - Abnormal; Notable for the following components:   Glucose-Capillary 589 (*)    All other components within normal limits  CULTURE, BLOOD (ROUTINE X 2)  CULTURE, BLOOD (ROUTINE X 2)  RESP PANEL BY RT-PCR (FLU A&B, COVID) ARPGX2  CBC WITH DIFFERENTIAL/PLATELET  LACTIC ACID, PLASMA  LACTIC ACID, PLASMA  BASIC METABOLIC PANEL  BASIC METABOLIC PANEL  BASIC METABOLIC PANEL  BASIC METABOLIC PANEL  OSMOLALITY  CBC WITH DIFFERENTIAL/PLATELET  URINALYSIS, ROUTINE W REFLEX  MICROSCOPIC  CBG MONITORING, ED    EKG None  Radiology CT EXTREMITY LOWER LEFT W CONTRAST  Result Date: 12/26/2020 CLINICAL DATA:  Soft tissue infection suspected, lower leg, xray done L leg infection just proximal to the knee EXAM: CT OF THE LOWER LEFT EXTREMITY (down to the knee) WITH CONTRAST TECHNIQUE: Multidetector CT imaging of the lower left extremity (down to the knee) was performed according to the standard protocol following intravenous contrast administration. CONTRAST:  166m OMNIPAQUE IOHEXOL 300 MG/ML  SOLN COMPARISON:  None. FINDINGS: Bones/Joint/Cartilage No cortical erosion or destruction. No acute displaced fracture or dislocation. Mild to moderate tricompartmental degenerative changes of the knee. Trace left knee effusion. No severe arthropathy of the hip. Ligaments Suboptimally assessed by CT. Muscles and Tendons Atrophic musculature. Question mild edema along the deep fascial layers (4:224-226.) Soft tissues Subcutaneus soft tissue edema and emphysema along the proximal to mid anterolateral left thigh subcutaneus soft tissues. Overlying dermal thickening. No organized fluid collection. Subcutaneus soft tissue edema and dermal thickening along the posteromedial left thigh and visualized posteromedial leg. Other: At least moderate calcified atherosclerotic plaque of the arteries with limited evaluation due to timing of contrast. Varicose veins noted. IMPRESSION: 1. Subcutaneus soft tissue edema and emphysema of the left thigh. Findings consistent with infection with concern for a necrotizing fasciitis. Please correlate clinically as this is a clinical diagnosis. 2. No organized fluid collection. 3. Atherosclerotic plaque. 4. Please note the left leg was not imaged (only thigh). These results were called by telephone at the time of interpretation on 12/26/2020 at 10:31 pm [facility] who verbally acknowledged these results. Electronically Signed   By: MIven FinnM.D.    On: 12/26/2020 22:35    Procedures .Critical Care Performed by:  Kassy Mcenroe, PA-C Authorized by: Franchot Heidelberg, PA-C   Critical care provider statement:    Critical care time (minutes):  50   Critical care time was exclusive of:  Separately billable procedures and treating other patients and teaching time   Critical care was necessary to treat or prevent imminent or life-threatening deterioration of the following conditions:  Sepsis and metabolic crisis   Critical care was time spent personally by me on the following activities:  Blood draw for specimens, development of treatment plan with patient or surrogate, discussions with consultants, evaluation of patient's response to treatment, examination of patient, obtaining history from patient or surrogate, ordering and performing treatments and interventions, ordering and review of laboratory studies, ordering and review of radiographic studies, pulse oximetry, re-evaluation of patient's condition and review of old charts   I assumed direction of critical care for this patient from another provider in my specialty: no     Care discussed with: admitting provider   Comments:     Pt with significant infection of the leg, concerning for possible nec fasciitis. Pt admitted for iv abx and surgery consultation.      Medications Ordered in ED Medications  sodium chloride (PF) 0.9 % injection (has no administration in time range)  insulin aspart (novoLOG) injection 8 Units (has no administration in time range)  sodium chloride 0.9 % bolus 1,000 mL (has no administration in time range)  lactated ringers bolus 3,646 mL (has no administration in time range)  insulin regular, human (MYXREDLIN) 100 units/ 100 mL infusion (has no administration in time range)  lactated ringers infusion (has no administration in time range)  dextrose 5 % in lactated ringers infusion (has no administration in time range)  dextrose 50 % solution 0-50 mL (has no  administration in time range)  potassium chloride 10 mEq in 100 mL IVPB (has no administration in time range)  fentaNYL (SUBLIMAZE) injection 100 mcg (100 mcg Intravenous Given 12/26/20 2019)  metoCLOPramide (REGLAN) injection 10 mg (10 mg Intravenous Given 12/26/20 2019)  diphenhydrAMINE (BENADRYL) capsule 50 mg (50 mg Oral Given 12/26/20 2028)  piperacillin-tazobactam (ZOSYN) IVPB 3.375 g (3.375 g Intravenous New Bag/Given 12/26/20 2035)  vancomycin (VANCOREADY) IVPB 2000 mg/400 mL (2,000 mg Intravenous New Bag/Given 12/26/20 2107)  iohexol (OMNIPAQUE) 300 MG/ML solution 100 mL (100 mLs Intravenous Contrast Given 12/26/20 2215)    ED Course  I have reviewed the triage vital signs and the nursing notes.  Pertinent labs & imaging results that were available during my care of the patient were reviewed by me and considered in my medical decision making (see chart for details).    MDM Rules/Calculators/A&P                          Patient presenting for evaluation of left leg pain, redness, drainage.  On exam, patient has clear signs of infection of the left lateral leg.  She reports feeling generally weak with nausea and vomiting.  No fevers.  Vital signs do not meet SIRS criteria. Will order labs including lactic and blood cultures. Nausea and pain medication ordered. borad spectrum IV abx ordered. CT ordered.   Labs interpreted by me, no leukocytosis.  Lactic is normal.  Patient does have significant hyperglycemia, but no signs of DKA.  When corrected for pseudohyponatremia, sodium is normal at 134.  Will give fluids and glucose to treat hyperglycemia.  CT concerning for significant edema and gas in the proximal mid anterior lateral  leg. No clear abscess. Concern for early fasciitis. on exam, pt without crepitus. Will consult with general surgery.   Discussed with Dr. Marlou Starks from general surgery, who states surgery team will eval pt in the AM. Requests admit to medicine.   Discussed with Dr[facility] Flossie Buffy [facility]. Pt started on insulin gtt.   Discussed with Dr Marlou Starks from gen surgery again regarding concern for nec fasciitis, he will evaluate pt tonight.   Final Clinical Impression(s) / ED Diagnoses Final diagnoses:  Necrotizing fasciitis ([place])  Cellulitis of left lower extremity  Hyperglycemia due to diabetes mellitus [facility])    Rx / DC Orders ED Discharge Orders    None       Franchot Heidelberg, PA-C 12/26/20 2319    Arnaldo Natal, MD 12/26/20 [phone]

## 2020-12-26 NOTE — Progress Notes (Signed)
IV obtained by bedside RN upon arrival. 

## 2020-12-26 NOTE — Consult Note (Addendum)
Reason for Consult:abscess Referring Physician: Dr. Bethel Bornaccavale  Karen Bennett is an 45 y.o. female.  HPI: The patient is a 45 year old white female who presents with a tender area on lateral distal left thigh for the last week. She feels as though she bumped it about a week ago and developed a blister that ruptured. The area has slowly gotten worse over the last week from a pain standpoint. She denies any fever. She does report nausea and vomiting. She had a blood sugar of 648 at 8pm and still needs insulin started. Wbc normal. Lactate normal. No hypotension or tachycardia  Past Medical History:  Diagnosis Date  . Cellulitis   . Diabetes mellitus   . PE (pulmonary embolism) ~2007-2008   Not on anticoagulation    Past Surgical History:  Procedure Laterality Date  . Abscess removal from L groin    . APPLICATION OF WOUND VAC Left 05/01/2013   Procedure: APPLICATION OF WOUND VAC;  Surgeon: Kerrin ChampagneJames E Nitka, MD;  Location: [facility];  Service: Orthopedics;  Laterality: Left;  . I & D EXTREMITY  11/12/2011   Procedure: IRRIGATION AND DEBRIDEMENT EXTREMITY;  Surgeon: Kathryne Hitchhristopher Y Blackman, MD;  Location: [facility];  Service: Orthopedics;  Laterality: Left;  foot left  . I & D EXTREMITY Left 09/06/2012   Procedure: IRRIGATION AND DEBRIDEMENT EXTREMITY;  Surgeon: Eldred MangesMark C Yates, MD;  Location: [facility];  Service: Orthopedics;  Laterality: Left;  . I & D EXTREMITY Left 05/01/2013   Procedure: INCISION AND DRAINAGE LEFT FOREFOOT ABCESS ;  Surgeon: Kerrin ChampagneJames E Nitka, MD;  Location: [facility];  Service: Orthopedics;  Laterality: Left;  . Surgery to remove hematoma in L leg    . 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    Family History  Problem Relation Age of Onset  . Diabetes Mother   . Coronary artery disease Mother   . Stroke Mother     Social History:  reports that she has never smoked. She has never used smokeless tobacco. She reports that she does not drink alcohol and does not use drugs.  Allergies:   Allergies  Allergen Reactions  . Clindamycin Nausea And Vomiting and Nausea Only  . Zofran Itching and Nausea And Vomiting  . Doxycycline Nausea And Vomiting  . Morphine And Related Itching    Medications: I have reviewed the patient's current medications.  Results for orders placed or performed during the hospital encounter of 12/26/20 (from the past 48 hour(s))  Lactic acid, plasma     Status: None   Collection Time: 12/26/20  6:56 PM  Result Value Ref Range   Lactic Acid, Venous 1.1 0.5 - 1.9 mmol/L    Comment: Performed at [facility], [address] [place], [place] [id number]  POC CBG, ED     Status: Abnormal   Collection Time: 12/26/20  7:37 PM  Result Value Ref Range   Glucose-Capillary 589 (HH) 70 - 99 mg/dL    Comment: Glucose reference range applies only to samples taken after fasting for at least 8 hours.  CBC with Differential     Status: None   Collection Time: 12/26/20  8:35 PM  Result Value Ref Range   WBC 6.9 4.0 - 10.5 K/uL   RBC 4.42 3.87 - 5.11 MIL/uL   Hemoglobin 12.9 12.0 - 15.0 g/dL   HCT 75.638.3 43.336.0 - 29.546.0 %   MCV 86.7 80.0 - 100.0 fL   MCH 29.2 26.0 -  34.0 pg   MCHC 33.7 30.0 - 36.0 g/dL   RDW 16.1 09.6 - 04.5 %   Platelets 263 150 - 400 K/uL   nRBC 0.0 0.0 - 0.2 %   Neutrophils Relative % 69 %   Neutro Abs 4.8 1.7 - 7.7 K/uL   Lymphocytes Relative 19 %   Lymphs Abs 1.3 0.7 - 4.0 K/uL   Monocytes Relative 9 %   Monocytes Absolute 0.6 0.1 - 1.0 K/uL   Eosinophils Relative 1 %   Eosinophils Absolute 0.1 0.0 - 0.5 K/uL   Basophils Relative 1 %   Basophils Absolute 0.0 0.0 - 0.1 K/uL   Immature Granulocytes 1 %   Abs Immature Granulocytes 0.05 0.00 - 0.07 K/uL    Comment: Performed at [facility], [address] [place], [place] [address]  Comprehensive metabolic panel     Status: Abnormal   Collection Time: 12/26/20  8:35 PM  Result Value Ref Range   Sodium 121 (L) 135 - 145 mmol/L   Potassium 4.0 3.5 -  5.1 mmol/L   Chloride 90 (L) 98 - 111 mmol/L   CO2 23 22 - 32 mmol/L   Glucose, Bld 648 (HH) 70 - 99 mg/dL    Comment: Glucose reference range applies only to samples taken after fasting for at least 8 hours. CRITICAL RESULT CALLED TO, READ BACK BY AND VERIFIED WITH: Kathlene November AT 2125 ON 12/26/20 BY MAJ    BUN 20 6 - 20 mg/dL   Creatinine, Ser 1.91 (H) 0.44 - 1.00 mg/dL   Calcium 8.3 (L) 8.9 - 10.3 mg/dL   Total Protein 7.2 6.5 - 8.1 g/dL   Albumin 2.2 (L) 3.5 - 5.0 g/dL   AST 8 (L) 15 - 41 U/L   ALT 10 0 - 44 U/L   Alkaline Phosphatase 77 38 - 126 U/L   Total Bilirubin 0.6 0.3 - 1.2 mg/dL   GFR, Estimated >47 >82 mL/min    Comment: (NOTE) Calculated using the CKD-EPI Creatinine Equation (2021)    Anion gap 8 5 - 15    Comment: Performed at [facility], [address], [place], [place] [address]    CT EXTREMITY LOWER LEFT W CONTRAST  Result Date: 12/26/2020 CLINICAL DATA:  Soft tissue infection suspected, lower leg, xray done L leg infection just proximal to the knee EXAM: CT OF THE LOWER LEFT EXTREMITY (down to the knee) WITH CONTRAST TECHNIQUE: Multidetector CT imaging of the lower left extremity (down to the knee) was performed according to the standard protocol following intravenous contrast administration. CONTRAST:  OMNIPAQUE IOHEXOL 300 MG/ML  SOLN COMPARISON:  None. FINDINGS: Bones/Joint/Cartilage No cortical erosion or destruction. No acute displaced fracture or dislocation. Mild to moderate tricompartmental degenerative changes of the knee. Trace left knee effusion. No severe arthropathy of the hip. Ligaments Suboptimally assessed by CT. Muscles and Tendons Atrophic musculature. Question mild edema along the deep fascial layers (4:224-226.) Soft tissues Subcutaneus soft tissue edema and emphysema along the proximal to mid anterolateral left thigh subcutaneus soft tissues. Overlying dermal thickening. No organized fluid collection. Subcutaneus soft  tissue edema and dermal thickening along the posteromedial left thigh and visualized posteromedial leg. Other: At least moderate calcified atherosclerotic plaque of the arteries with limited evaluation due to timing of contrast. Varicose veins noted. IMPRESSION: 1. Subcutaneus soft tissue edema and emphysema of the left thigh. Findings consistent with infection with concern for a necrotizing fasciitis. Please correlate clinically as this is a clinical  diagnosis. 2. No organized fluid collection. 3. Atherosclerotic plaque. 4. Please note the left leg was not imaged (only thigh). These results were called by telephone at the time of interpretation on 12/26/2020 at 10:31 pm to provider [facility] , who verbally acknowledged these results. Electronically Signed   By: Tish Frederickson M.D.   On: 12/26/2020 22:35    Review of Systems  Constitutional: Positive for fatigue. Negative for fever.  HENT: Negative.   Eyes: Negative.   Respiratory: Negative.   Cardiovascular: Negative.   Gastrointestinal: Negative.   Endocrine: Negative.   Genitourinary: Negative.   Musculoskeletal: Positive for gait problem.  Skin: Positive for wound.  Allergic/Immunologic: Negative.   Hematological: Negative.   Psychiatric/Behavioral: Negative.    Blood pressure (!) 166/62, pulse 79, temperature 99.1 F (37.3 C), temperature source Oral, resp. rate 18, height 5\' 6"  (1.676 m), weight (!) 182.3 kg, SpO2 96 %. Physical Exam Constitutional:      General: She is not in acute distress.    Appearance: She is obese.  HENT:     Head: Normocephalic and atraumatic.     Right Ear: External ear normal.     Left Ear: External ear normal.     Nose: Nose normal.     Mouth/Throat:     Mouth: Mucous membranes are moist.     Pharynx: Oropharynx is clear.  Eyes:     General: No scleral icterus.    Extraocular Movements: Extraocular movements intact.     Conjunctiva/sclera: Conjunctivae normal.     Pupils: Pupils are equal,  round, and reactive to light.  Cardiovascular:     Rate and Rhythm: Normal rate and regular rhythm.     Heart sounds: Normal heart sounds.     Comments: R BKA, L toe amputation. Poor peripheral pulses Pulmonary:     Effort: Pulmonary effort is normal. No respiratory distress.     Breath sounds: Normal breath sounds.  Abdominal:     Palpations: Abdomen is soft.     Tenderness: There is no abdominal tenderness.  Musculoskeletal:     Cervical back: Normal range of motion and neck supple. No tenderness.     Comments: There is severe morbid obesity. There is baseline edema of lower extr. There is a ulcerated wound at the lateral distal left thigh with surrounding cellulitis and drainage from the ulcerated area. Area measures about 10cm  Neurological:     General: No focal deficit present.     Mental Status: She is alert and oriented to person, place, and time.  Psychiatric:        Mood and Affect: Mood normal.        Behavior: Behavior normal.     Assessment/Plan: The patient appears to have an abscess of the left lateral distal thigh with a blood sugar of 648. Because of her poorly controlled diabetes she does have significant risk of losing the left leg with any infection. I discussed her situation with anesthesia and we both felt it may be safer for her to get her blood sugar down and resuscitate her aggressively over the next few hours with plans to take her to the operating room once her blood sugar is closer to a normal range for incision and drainage. I discussed this with the patient in detail including the risks and benefits of the surgery including loss of leg as well as some of the technical aspects and she understands and wishes to proceed. She will need aggressive treatment with insulin  and fluids over the next several hours. IV abx have been started. She should go to the icu so she can be monitored closely  Karen Bennett 12/26/2020, 11:56 PM

## 2020-12-26 NOTE — ED Triage Notes (Signed)
Per [facility] pt from home, called out for blisters on left leg, one formed last night and popped and another one formed. Home CNA called. Blisters are warm to touch 160/90 H107 RA 95  R 24

## 2020-12-26 NOTE — H&P (View-Only) (Signed)
Reason for Consult:abscess Referring Physician: Dr. Caccavale  Karen Bennett is an 45 y.o. female.  HPI: The patient is a 45 year old white female who presents with a tender area on lateral distal left thigh for the last week. She feels as though she bumped it about a week ago and developed a blister that ruptured. The area has slowly gotten worse over the last week from a pain standpoint. She denies any fever. She does report nausea and vomiting. She had a blood sugar of 648 at 8pm and still needs insulin started. Wbc normal. Lactate normal. No hypotension or tachycardia  Past Medical History:  Diagnosis Date  . Cellulitis   . Diabetes mellitus   . PE (pulmonary embolism) ~2007-2008   Not on anticoagulation    Past Surgical History:  Procedure Laterality Date  . Abscess removal from L groin    . APPLICATION OF WOUND VAC Left 05/01/2013   Procedure: APPLICATION OF WOUND VAC;  Surgeon: James E Nitka, MD;  Location: [facility];  Service: Orthopedics;  Laterality: Left;  . I & D EXTREMITY  11/12/2011   Procedure: IRRIGATION AND DEBRIDEMENT EXTREMITY;  Surgeon: Christopher Y Blackman, MD;  Location: [facility];  Service: Orthopedics;  Laterality: Left;  foot left  . I & D EXTREMITY Left 09/06/2012   Procedure: IRRIGATION AND DEBRIDEMENT EXTREMITY;  Surgeon: Mark C Yates, MD;  Location: [facility];  Service: Orthopedics;  Laterality: Left;  . I & D EXTREMITY Left 05/01/2013   Procedure: INCISION AND DRAINAGE LEFT FOREFOOT ABCESS ;  Surgeon: James E Nitka, MD;  Location: [facility];  Service: Orthopedics;  Laterality: Left;  . Surgery to remove hematoma in L leg    . 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    Family History  Problem Relation Age of Onset  . Diabetes Mother   . Coronary artery disease Mother   . Stroke Mother     Social History:  reports that she has never smoked. She has never used smokeless tobacco. She reports that she does not drink alcohol and does not use drugs.  Allergies:   Allergies  Allergen Reactions  . Clindamycin Nausea And Vomiting and Nausea Only  . Zofran Itching and Nausea And Vomiting  . Doxycycline Nausea And Vomiting  . Morphine And Related Itching    Medications: I have reviewed the patient's current medications.  Results for orders placed or performed during the hospital encounter of 12/26/20 (from the past 48 hour(s))  Lactic acid, plasma     Status: None   Collection Time: 12/26/20  6:56 PM  Result Value Ref Range   Lactic Acid, Venous 1.1 0.5 - 1.9 mmol/L    Comment: Performed at [facility], [address] [place], [place] [address]  POC CBG, ED     Status: Abnormal   Collection Time: 12/26/20  7:37 PM  Result Value Ref Range   Glucose-Capillary 589 (HH) 70 - 99 mg/dL    Comment: Glucose reference range applies only to samples taken after fasting for at least 8 hours.  CBC with Differential     Status: None   Collection Time: 12/26/20  8:35 PM  Result Value Ref Range   WBC 6.9 4.0 - 10.5 K/uL   RBC 4.42 3.87 - 5.11 MIL/uL   Hemoglobin 12.9 12.0 - 15.0 g/dL   HCT 38.3 36.0 - 46.0 %   MCV 86.7 80.0 - 100.0 fL   MCH 29.2 26.0 -   34.0 pg   MCHC 33.7 30.0 - 36.0 g/dL   RDW 16.1 09.6 - 04.5 %   Platelets 263 150 - 400 K/uL   nRBC 0.0 0.0 - 0.2 %   Neutrophils Relative % 69 %   Neutro Abs 4.8 1.7 - 7.7 K/uL   Lymphocytes Relative 19 %   Lymphs Abs 1.3 0.7 - 4.0 K/uL   Monocytes Relative 9 %   Monocytes Absolute 0.6 0.1 - 1.0 K/uL   Eosinophils Relative 1 %   Eosinophils Absolute 0.1 0.0 - 0.5 K/uL   Basophils Relative 1 %   Basophils Absolute 0.0 0.0 - 0.1 K/uL   Immature Granulocytes 1 %   Abs Immature Granulocytes 0.05 0.00 - 0.07 K/uL    Comment: Performed at [facility], [address] [place], [place] [address]  Comprehensive metabolic panel     Status: Abnormal   Collection Time: 12/26/20  8:35 PM  Result Value Ref Range   Sodium 121 (L) 135 - 145 mmol/L   Potassium 4.0 3.5 -  5.1 mmol/L   Chloride 90 (L) 98 - 111 mmol/L   CO2 23 22 - 32 mmol/L   Glucose, Bld 648 (HH) 70 - 99 mg/dL    Comment: Glucose reference range applies only to samples taken after fasting for at least 8 hours. CRITICAL RESULT CALLED TO, READ BACK BY AND VERIFIED WITH: Kathlene November AT 2125 ON 12/26/20 BY MAJ    BUN 20 6 - 20 mg/dL   Creatinine, Ser 1.91 (H) 0.44 - 1.00 mg/dL   Calcium 8.3 (L) 8.9 - 10.3 mg/dL   Total Protein 7.2 6.5 - 8.1 g/dL   Albumin 2.2 (L) 3.5 - 5.0 g/dL   AST 8 (L) 15 - 41 U/L   ALT 10 0 - 44 U/L   Alkaline Phosphatase 77 38 - 126 U/L   Total Bilirubin 0.6 0.3 - 1.2 mg/dL   GFR, Estimated >47 >82 mL/min    Comment: (NOTE) Calculated using the CKD-EPI Creatinine Equation (2021)    Anion gap 8 5 - 15    Comment: Performed at [facility], [address], [place], [place] [address]    CT EXTREMITY LOWER LEFT W CONTRAST  Result Date: 12/26/2020 CLINICAL DATA:  Soft tissue infection suspected, lower leg, xray done L leg infection just proximal to the knee EXAM: CT OF THE LOWER LEFT EXTREMITY (down to the knee) WITH CONTRAST TECHNIQUE: Multidetector CT imaging of the lower left extremity (down to the knee) was performed according to the standard protocol following intravenous contrast administration. CONTRAST:  OMNIPAQUE IOHEXOL 300 MG/ML  SOLN COMPARISON:  None. FINDINGS: Bones/Joint/Cartilage No cortical erosion or destruction. No acute displaced fracture or dislocation. Mild to moderate tricompartmental degenerative changes of the knee. Trace left knee effusion. No severe arthropathy of the hip. Ligaments Suboptimally assessed by CT. Muscles and Tendons Atrophic musculature. Question mild edema along the deep fascial layers (4:224-226.) Soft tissues Subcutaneus soft tissue edema and emphysema along the proximal to mid anterolateral left thigh subcutaneus soft tissues. Overlying dermal thickening. No organized fluid collection. Subcutaneus soft  tissue edema and dermal thickening along the posteromedial left thigh and visualized posteromedial leg. Other: At least moderate calcified atherosclerotic plaque of the arteries with limited evaluation due to timing of contrast. Varicose veins noted. IMPRESSION: 1. Subcutaneus soft tissue edema and emphysema of the left thigh. Findings consistent with infection with concern for a necrotizing fasciitis. Please correlate clinically as this is a clinical  diagnosis. 2. No organized fluid collection. 3. Atherosclerotic plaque. 4. Please note the left leg was not imaged (only thigh). These results were called by telephone at the time of interpretation on 12/26/2020 at 10:31 pm to provider [facility] , who verbally acknowledged these results. Electronically Signed   By: Tish Frederickson M.D.   On: 12/26/2020 22:35    Review of Systems  Constitutional: Positive for fatigue. Negative for fever.  HENT: Negative.   Eyes: Negative.   Respiratory: Negative.   Cardiovascular: Negative.   Gastrointestinal: Negative.   Endocrine: Negative.   Genitourinary: Negative.   Musculoskeletal: Positive for gait problem.  Skin: Positive for wound.  Allergic/Immunologic: Negative.   Hematological: Negative.   Psychiatric/Behavioral: Negative.    Blood pressure (!) 166/62, pulse 79, temperature 99.1 F (37.3 C), temperature source Oral, resp. rate 18, height 5\' 6"  (1.676 m), weight (!) 182.3 kg, SpO2 96 %. Physical Exam Constitutional:      General: She is not in acute distress.    Appearance: She is obese.  HENT:     Head: Normocephalic and atraumatic.     Right Ear: External ear normal.     Left Ear: External ear normal.     Nose: Nose normal.     Mouth/Throat:     Mouth: Mucous membranes are moist.     Pharynx: Oropharynx is clear.  Eyes:     General: No scleral icterus.    Extraocular Movements: Extraocular movements intact.     Conjunctiva/sclera: Conjunctivae normal.     Pupils: Pupils are equal,  round, and reactive to light.  Cardiovascular:     Rate and Rhythm: Normal rate and regular rhythm.     Heart sounds: Normal heart sounds.     Comments: R BKA, L toe amputation. Poor peripheral pulses Pulmonary:     Effort: Pulmonary effort is normal. No respiratory distress.     Breath sounds: Normal breath sounds.  Abdominal:     Palpations: Abdomen is soft.     Tenderness: There is no abdominal tenderness.  Musculoskeletal:     Cervical back: Normal range of motion and neck supple. No tenderness.     Comments: There is severe morbid obesity. There is baseline edema of lower extr. There is a ulcerated wound at the lateral distal left thigh with surrounding cellulitis and drainage from the ulcerated area. Area measures about 10cm  Neurological:     General: No focal deficit present.     Mental Status: She is alert and oriented to person, place, and time.  Psychiatric:        Mood and Affect: Mood normal.        Behavior: Behavior normal.     Assessment/Plan: The patient appears to have an abscess of the left lateral distal thigh with a blood sugar of 648. Because of her poorly controlled diabetes she does have significant risk of losing the left leg with any infection. I discussed her situation with anesthesia and we both felt it may be safer for her to get her blood sugar down and resuscitate her aggressively over the next few hours with plans to take her to the operating room once her blood sugar is closer to a normal range for incision and drainage. I discussed this with the patient in detail including the risks and benefits of the surgery including loss of leg as well as some of the technical aspects and she understands and wishes to proceed. She will need aggressive treatment with insulin  and fluids over the next several hours. IV abx have been started. She should go to the icu so she can be monitored closely  Chevis Pretty III 12/26/2020, 11:56 PM

## 2020-12-27 ENCOUNTER — Inpatient Hospital Stay (HOSPITAL_COMMUNITY): Payer: Medicare Other | Admitting: Anesthesiology

## 2020-12-27 ENCOUNTER — Encounter (HOSPITAL_COMMUNITY)
Admission: EM | Disposition: A | Discharge status: Home Health | Payer: Self-pay | Source: Home / Self Care | Attending: Internal Medicine

## 2020-12-27 ENCOUNTER — Other Ambulatory Visit: Payer: Self-pay

## 2020-12-27 ENCOUNTER — Encounter (HOSPITAL_COMMUNITY): Payer: Self-pay | Admitting: Family Medicine

## 2020-12-27 DIAGNOSIS — N179 Acute kidney failure, unspecified: Secondary | ICD-10-CM | POA: Diagnosis present

## 2020-12-27 DIAGNOSIS — L89312 Pressure ulcer of right buttock, stage 2: Secondary | ICD-10-CM | POA: Diagnosis present

## 2020-12-27 DIAGNOSIS — I708 Atherosclerosis of other arteries: Secondary | ICD-10-CM | POA: Diagnosis present

## 2020-12-27 DIAGNOSIS — E1165 Type 2 diabetes mellitus with hyperglycemia: Secondary | ICD-10-CM | POA: Diagnosis present

## 2020-12-27 DIAGNOSIS — I340 Nonrheumatic mitral (valve) insufficiency: Secondary | ICD-10-CM | POA: Diagnosis not present

## 2020-12-27 DIAGNOSIS — B377 Candidal sepsis: Secondary | ICD-10-CM | POA: Diagnosis not present

## 2020-12-27 DIAGNOSIS — E118 Type 2 diabetes mellitus with unspecified complications: Secondary | ICD-10-CM | POA: Diagnosis not present

## 2020-12-27 DIAGNOSIS — B961 Klebsiella pneumoniae [K. pneumoniae] as the cause of diseases classified elsewhere: Secondary | ICD-10-CM | POA: Diagnosis present

## 2020-12-27 DIAGNOSIS — Z1611 Resistance to penicillins: Secondary | ICD-10-CM | POA: Diagnosis present

## 2020-12-27 DIAGNOSIS — I152 Hypertension secondary to endocrine disorders: Secondary | ICD-10-CM

## 2020-12-27 DIAGNOSIS — L02416 Cutaneous abscess of left lower limb: Secondary | ICD-10-CM | POA: Diagnosis present

## 2020-12-27 DIAGNOSIS — Z20822 Contact with and (suspected) exposure to covid-19: Secondary | ICD-10-CM | POA: Diagnosis present

## 2020-12-27 DIAGNOSIS — M726 Necrotizing fasciitis: Secondary | ICD-10-CM | POA: Diagnosis present

## 2020-12-27 DIAGNOSIS — E11628 Type 2 diabetes mellitus with other skin complications: Secondary | ICD-10-CM | POA: Diagnosis present

## 2020-12-27 DIAGNOSIS — Z6841 Body Mass Index (BMI) 40.0 and over, adult: Secondary | ICD-10-CM

## 2020-12-27 DIAGNOSIS — F419 Anxiety disorder, unspecified: Secondary | ICD-10-CM | POA: Diagnosis present

## 2020-12-27 DIAGNOSIS — E1140 Type 2 diabetes mellitus with diabetic neuropathy, unspecified: Secondary | ICD-10-CM | POA: Diagnosis present

## 2020-12-27 DIAGNOSIS — I361 Nonrheumatic tricuspid (valve) insufficiency: Secondary | ICD-10-CM | POA: Diagnosis not present

## 2020-12-27 DIAGNOSIS — I330 Acute and subacute infective endocarditis: Secondary | ICD-10-CM | POA: Diagnosis present

## 2020-12-27 DIAGNOSIS — Z86711 Personal history of pulmonary embolism: Secondary | ICD-10-CM | POA: Diagnosis not present

## 2020-12-27 DIAGNOSIS — I10 Essential (primary) hypertension: Secondary | ICD-10-CM | POA: Diagnosis present

## 2020-12-27 DIAGNOSIS — E871 Hypo-osmolality and hyponatremia: Secondary | ICD-10-CM | POA: Diagnosis present

## 2020-12-27 DIAGNOSIS — Z89511 Acquired absence of right leg below knee: Secondary | ICD-10-CM | POA: Diagnosis not present

## 2020-12-27 DIAGNOSIS — F32A Depression, unspecified: Secondary | ICD-10-CM | POA: Diagnosis present

## 2020-12-27 DIAGNOSIS — Z86718 Personal history of other venous thrombosis and embolism: Secondary | ICD-10-CM | POA: Diagnosis not present

## 2020-12-27 DIAGNOSIS — L03116 Cellulitis of left lower limb: Secondary | ICD-10-CM | POA: Diagnosis present

## 2020-12-27 DIAGNOSIS — B372 Candidiasis of skin and nail: Secondary | ICD-10-CM | POA: Diagnosis present

## 2020-12-27 HISTORY — DX: Hypertension secondary to endocrine disorders: I15.2

## 2020-12-27 HISTORY — PX: INCISION AND DRAINAGE ABSCESS: SHX5864

## 2020-12-27 LAB — CBC WITH DIFFERENTIAL/PLATELET
Abs Immature Granulocytes: 0.05 10*3/uL (ref 0.00–0.07)
Basophils Absolute: 0 10*3/uL (ref 0.0–0.1)
Basophils Relative: 0 %
Eosinophils Absolute: 0.1 10*3/uL (ref 0.0–0.5)
Eosinophils Relative: 2 %
HCT: 36.1 % (ref 36.0–46.0)
Hemoglobin: 12.1 g/dL (ref 12.0–15.0)
Immature Granulocytes: 1 %
Lymphocytes Relative: 24 %
Lymphs Abs: 1.5 10*3/uL (ref 0.7–4.0)
MCH: 28.7 pg (ref 26.0–34.0)
MCHC: 33.5 g/dL (ref 30.0–36.0)
MCV: 85.7 fL (ref 80.0–100.0)
Monocytes Absolute: 0.6 10*3/uL (ref 0.1–1.0)
Monocytes Relative: 10 %
Neutro Abs: 3.9 10*3/uL (ref 1.7–7.7)
Neutrophils Relative %: 63 %
Platelets: 251 10*3/uL (ref 150–400)
RBC: 4.21 MIL/uL (ref 3.87–5.11)
RDW: 13 % (ref 11.5–15.5)
WBC: 6.1 10*3/uL (ref 4.0–10.5)
nRBC: 0 % (ref 0.0–0.2)

## 2020-12-27 LAB — BASIC METABOLIC PANEL
Anion gap: 11 (ref 5–15)
Anion gap: 8 (ref 5–15)
Anion gap: 8 (ref 5–15)
BUN: 19 mg/dL (ref 6–20)
BUN: 19 mg/dL (ref 6–20)
BUN: 21 mg/dL — ABNORMAL HIGH (ref 6–20)
CO2: 18 mmol/L — ABNORMAL LOW (ref 22–32)
CO2: 23 mmol/L (ref 22–32)
CO2: 24 mmol/L (ref 22–32)
Calcium: 8.4 mg/dL — ABNORMAL LOW (ref 8.9–10.3)
Calcium: 8.5 mg/dL — ABNORMAL LOW (ref 8.9–10.3)
Calcium: 8.5 mg/dL — ABNORMAL LOW (ref 8.9–10.3)
Chloride: 95 mmol/L — ABNORMAL LOW (ref 98–111)
Chloride: 98 mmol/L (ref 98–111)
Chloride: 95 mmol/L — ABNORMAL LOW (ref 98–111)
Creatinine, Ser: 1 mg/dL (ref 0.44–1.00)
Creatinine, Ser: 1.08 mg/dL — ABNORMAL HIGH (ref 0.44–1.00)
Creatinine, Ser: 1.33 mg/dL — ABNORMAL HIGH (ref 0.44–1.00)
GFR, Estimated: >60 mL/min (ref >60)
GFR, Estimated: >60 mL/min (ref >60)
GFR, Estimated: 51 mL/min — ABNORMAL LOW (ref >60)
Glucose, Bld: 165 mg/dL — ABNORMAL HIGH (ref 70–99)
Glucose, Bld: 447 mg/dL — ABNORMAL HIGH (ref 70–99)
Glucose, Bld: 209 mg/dL — ABNORMAL HIGH (ref 70–99)
Potassium: 3.6 mmol/L (ref 3.5–5.1)
Potassium: 4.1 mmol/L (ref 3.5–5.1)
Potassium: 3.7 mmol/L (ref 3.5–5.1)
Sodium: 126 mmol/L — ABNORMAL LOW (ref 135–145)
Sodium: 127 mmol/L — ABNORMAL LOW (ref 135–145)
Sodium: 127 mmol/L — ABNORMAL LOW (ref 135–145)

## 2020-12-27 LAB — GLUCOSE, CAPILLARY
Glucose-Capillary: 176 mg/dL — ABNORMAL HIGH (ref 70–99)
Glucose-Capillary: 178 mg/dL — ABNORMAL HIGH (ref 70–99)
Glucose-Capillary: 232 mg/dL — ABNORMAL HIGH (ref 70–99)
Glucose-Capillary: 201 mg/dL — ABNORMAL HIGH (ref 70–99)
Glucose-Capillary: 166 mg/dL — ABNORMAL HIGH (ref 70–99)
Glucose-Capillary: 221 mg/dL — ABNORMAL HIGH (ref 70–99)
Glucose-Capillary: 213 mg/dL — ABNORMAL HIGH (ref 70–99)
Glucose-Capillary: 387 mg/dL — ABNORMAL HIGH (ref 70–99)

## 2020-12-27 LAB — URINALYSIS, ROUTINE W REFLEX MICROSCOPIC
Bilirubin Urine: NEGATIVE
Glucose, UA: >=500 mg/dL — AB
Ketones, ur: NEGATIVE mg/dL
Nitrite: NEGATIVE
Protein, ur: 100 mg/dL — AB
Specific Gravity, Urine: 1.027 (ref 1.005–1.030)
WBC, UA: >50 WBC/hpf — ABNORMAL HIGH (ref 0–5)
pH: 6 (ref 5.0–8.0)

## 2020-12-27 LAB — CBG MONITORING, ED
Glucose-Capillary: 509 mg/dL (ref 70–99)
Glucose-Capillary: 393 mg/dL — ABNORMAL HIGH (ref 70–99)
Glucose-Capillary: 293 mg/dL — ABNORMAL HIGH (ref 70–99)
Glucose-Capillary: 183 mg/dL — ABNORMAL HIGH (ref 70–99)
Glucose-Capillary: 196 mg/dL — ABNORMAL HIGH (ref 70–99)

## 2020-12-27 LAB — LACTIC ACID, PLASMA: Lactic Acid, Venous: 1.8 mmol/L (ref 0.5–1.9)

## 2020-12-27 LAB — RESP PANEL BY RT-PCR (FLU A&B, COVID) ARPGX2
Influenza A by PCR: NEGATIVE
Influenza B by PCR: NEGATIVE
SARS Coronavirus 2 by RT PCR: NEGATIVE

## 2020-12-27 LAB — PREGNANCY, URINE: Preg Test, Ur: NEGATIVE

## 2020-12-27 LAB — OSMOLALITY: Osmolality: 296 mOsm/kg — ABNORMAL HIGH (ref 275–295)

## 2020-12-27 SURGERY — INCISION AND DRAINAGE, ABSCESS
Anesthesia: General | Site: Thigh | Laterality: Left

## 2020-12-27 MED ORDER — HYDROMORPHONE HCL 1 MG/ML IJ SOLN
INTRAMUSCULAR | Status: AC
  Filled 2020-12-27: qty 1

## 2020-12-27 MED ORDER — SODIUM CHLORIDE 0.9 % IV SOLN: INTRAVENOUS | Status: DC

## 2020-12-27 MED ORDER — INSULIN GLARGINE 100 UNIT/ML ~~LOC~~ SOLN
20.0000 [IU] | Freq: Two times a day (BID) | SUBCUTANEOUS | Status: DC
  Administered 2020-12-27 (×2): 20 [IU] via SUBCUTANEOUS
  Filled 2020-12-27 (×3): qty 0.2

## 2020-12-27 MED ORDER — MIDAZOLAM HCL 2 MG/2ML IJ SOLN
INTRAMUSCULAR | Status: AC
  Filled 2020-12-27: qty 2

## 2020-12-27 MED ORDER — SUCCINYLCHOLINE CHLORIDE 200 MG/10ML IV SOSY
PREFILLED_SYRINGE | INTRAVENOUS | Status: DC | PRN
  Administered 2020-12-27: 200 mg via INTRAVENOUS

## 2020-12-27 MED ORDER — PROPOFOL 10 MG/ML IV BOLUS
INTRAVENOUS | Status: AC
  Filled 2020-12-27: qty 40

## 2020-12-27 MED ORDER — MORPHINE SULFATE (PF) 2 MG/ML IV SOLN
2.0000 mg | INTRAVENOUS | Status: DC | PRN
  Filled 2020-12-27: qty 1

## 2020-12-27 MED ORDER — TRAZODONE HCL 50 MG PO TABS
25.0000 mg | ORAL_TABLET | Freq: Every day | ORAL | Status: DC
  Administered 2020-12-27 – 2021-01-04 (×10): 25 mg via ORAL
  Filled 2020-12-27 (×11): qty 1

## 2020-12-27 MED ORDER — VANCOMYCIN HCL 2000 MG/400ML IV SOLN
2000.0000 mg | Freq: Two times a day (BID) | INTRAVENOUS | Status: DC
  Administered 2020-12-27: 2000 mg via INTRAVENOUS
  Filled 2020-12-27: qty 400

## 2020-12-27 MED ORDER — FENTANYL CITRATE (PF) 250 MCG/5ML IJ SOLN
INTRAMUSCULAR | Status: DC | PRN
  Administered 2020-12-27: 50 ug via INTRAVENOUS

## 2020-12-27 MED ORDER — BUPROPION HCL ER (XL) 300 MG PO TB24
300.0000 mg | ORAL_TABLET | Freq: Every day | ORAL | Status: DC
  Administered 2020-12-28 – 2021-01-05 (×8): 300 mg via ORAL
  Filled 2020-12-27 (×8): qty 1

## 2020-12-27 MED ORDER — PIPERACILLIN-TAZOBACTAM 3.375 G IVPB
INTRAVENOUS | Status: AC
  Filled 2020-12-27: qty 50

## 2020-12-27 MED ORDER — OXYCODONE HCL 5 MG PO TABS: 5.0000 mg | ORAL_TABLET | Freq: Once | ORAL | Status: DC | PRN

## 2020-12-27 MED ORDER — PROMETHAZINE HCL 25 MG/ML IJ SOLN: 6.2500 mg | INTRAMUSCULAR | Status: DC | PRN

## 2020-12-27 MED ORDER — OXYCODONE HCL 5 MG/5ML PO SOLN
5.0000 mg | Freq: Once | ORAL | Status: DC | PRN
Start: 2020-12-27 — End: 2020-12-27

## 2020-12-27 MED ORDER — PIPERACILLIN-TAZOBACTAM 3.375 G IVPB
3.3750 g | Freq: Three times a day (TID) | INTRAVENOUS | Status: DC
  Administered 2020-12-27 – 2020-12-29 (×8): 3.375 g via INTRAVENOUS
  Filled 2020-12-27 (×9): qty 50

## 2020-12-27 MED ORDER — DEXAMETHASONE SODIUM PHOSPHATE 10 MG/ML IJ SOLN
INTRAMUSCULAR | Status: DC | PRN
  Administered 2020-12-27: 8 mg via INTRAVENOUS

## 2020-12-27 MED ORDER — OXYCODONE HCL 5 MG PO TABS
5.0000 mg | ORAL_TABLET | ORAL | Status: DC | PRN
  Administered 2020-12-27 – 2021-01-05 (×22): 5 mg via ORAL
  Filled 2020-12-27 (×22): qty 1

## 2020-12-27 MED ORDER — ONDANSETRON HCL 4 MG/2ML IJ SOLN
INTRAMUSCULAR | Status: DC | PRN
  Administered 2020-12-27: 44 mg via INTRAVENOUS

## 2020-12-27 MED ORDER — KETOROLAC TROMETHAMINE 30 MG/ML IJ SOLN
30.0000 mg | Freq: Once | INTRAMUSCULAR | Status: AC
  Administered 2020-12-27: 30 mg via INTRAVENOUS
  Filled 2020-12-27: qty 1

## 2020-12-27 MED ORDER — INSULIN ASPART 100 UNIT/ML IJ SOLN
0.0000 [IU] | Freq: Three times a day (TID) | INTRAMUSCULAR | Status: DC
  Administered 2020-12-27: 5 [IU] via SUBCUTANEOUS
  Administered 2020-12-28 (×2): 15 [IU] via SUBCUTANEOUS
  Administered 2020-12-28: 11 [IU] via SUBCUTANEOUS
  Administered 2020-12-29 (×2): 5 [IU] via SUBCUTANEOUS
  Administered 2020-12-29: 8 [IU] via SUBCUTANEOUS
  Administered 2020-12-30 (×2): 5 [IU] via SUBCUTANEOUS
  Administered 2020-12-30 – 2021-01-01 (×6): 3 [IU] via SUBCUTANEOUS
  Administered 2021-01-02 – 2021-01-05 (×8): 2 [IU] via SUBCUTANEOUS

## 2020-12-27 MED ORDER — HYDROMORPHONE HCL 1 MG/ML IJ SOLN
1.0000 mg | INTRAMUSCULAR | Status: DC | PRN
  Administered 2020-12-27 – 2021-01-05 (×26): 1 mg via INTRAVENOUS
  Filled 2020-12-27 (×27): qty 1

## 2020-12-27 MED ORDER — PHENYLEPHRINE 40 MCG/ML (10ML) SYRINGE FOR IV PUSH (FOR BLOOD PRESSURE SUPPORT)
PREFILLED_SYRINGE | INTRAVENOUS | Status: DC | PRN
  Administered 2020-12-27: 80 ug via INTRAVENOUS
  Administered 2020-12-27 (×2): 120 ug via INTRAVENOUS
  Administered 2020-12-27: 80 ug via INTRAVENOUS

## 2020-12-27 MED ORDER — INSULIN ASPART 100 UNIT/ML IJ SOLN: 0.0000 [IU] | Freq: Every day | INTRAMUSCULAR | Status: DC

## 2020-12-27 MED ORDER — MIDAZOLAM HCL 5 MG/5ML IJ SOLN
INTRAMUSCULAR | Status: DC | PRN
  Administered 2020-12-27: 2 mg via INTRAVENOUS

## 2020-12-27 MED ORDER — MIDAZOLAM HCL 2 MG/2ML IJ SOLN
0.5000 mg | Freq: Once | INTRAMUSCULAR | Status: DC | PRN
Start: 2020-12-27 — End: 2020-12-27

## 2020-12-27 MED ORDER — 0.9 % SODIUM CHLORIDE (POUR BTL) OPTIME
TOPICAL | Status: DC | PRN
  Administered 2020-12-27: 1000 mL

## 2020-12-27 MED ORDER — LIDOCAINE 2% (20 MG/ML) 5 ML SYRINGE
INTRAMUSCULAR | Status: DC | PRN
  Administered 2020-12-27: 20 mg via INTRAVENOUS

## 2020-12-27 MED ORDER — PANTOPRAZOLE SODIUM 40 MG PO TBEC
40.0000 mg | DELAYED_RELEASE_TABLET | Freq: Every day | ORAL | Status: DC
  Administered 2020-12-28 – 2021-01-05 (×8): 40 mg via ORAL
  Filled 2020-12-27 (×8): qty 1

## 2020-12-27 MED ORDER — SCOPOLAMINE 1 MG/3DAYS TD PT72
1.0000 | MEDICATED_PATCH | TRANSDERMAL | Status: DC
  Administered 2020-12-27: 1.5 mg via TRANSDERMAL
  Filled 2020-12-27: qty 1

## 2020-12-27 MED ORDER — EPHEDRINE SULFATE-NACL 50-0.9 MG/10ML-% IV SOSY
PREFILLED_SYRINGE | INTRAVENOUS | Status: DC | PRN
  Administered 2020-12-27: 10 mg via INTRAVENOUS

## 2020-12-27 MED ORDER — METHOCARBAMOL 1000 MG/10ML IJ SOLN
500.0000 mg | Freq: Once | INTRAVENOUS | Status: AC
  Administered 2020-12-27: 500 mg via INTRAVENOUS
  Filled 2020-12-27: qty 500

## 2020-12-27 MED ORDER — INSULIN ASPART 100 UNIT/ML IJ SOLN
5.0000 [IU] | Freq: Once | INTRAMUSCULAR | Status: AC
  Administered 2020-12-27: 5 [IU] via INTRAVENOUS

## 2020-12-27 MED ORDER — FENTANYL CITRATE (PF) 100 MCG/2ML IJ SOLN
100.0000 ug | Freq: Once | INTRAMUSCULAR | Status: AC
  Administered 2020-12-27: 100 ug via INTRAVENOUS
  Filled 2020-12-27: qty 2

## 2020-12-27 MED ORDER — VANCOMYCIN HCL IN DEXTROSE 1-5 GM/200ML-% IV SOLN
INTRAVENOUS | Status: AC
  Filled 2020-12-27: qty 400

## 2020-12-27 MED ORDER — ACETAMINOPHEN 325 MG PO TABS
650.0000 mg | ORAL_TABLET | Freq: Four times a day (QID) | ORAL | Status: DC
  Administered 2020-12-27 – 2021-01-05 (×33): 650 mg via ORAL
  Filled 2020-12-27 (×34): qty 2

## 2020-12-27 MED ORDER — FENTANYL CITRATE (PF) 100 MCG/2ML IJ SOLN
INTRAMUSCULAR | Status: AC
  Filled 2020-12-27: qty 2

## 2020-12-27 MED ORDER — VENLAFAXINE HCL ER 75 MG PO CP24
75.0000 mg | ORAL_CAPSULE | Freq: Every day | ORAL | Status: DC
  Administered 2020-12-28 – 2021-01-05 (×8): 75 mg via ORAL
  Filled 2020-12-27 (×8): qty 1

## 2020-12-27 MED ORDER — HYDROMORPHONE HCL 1 MG/ML IJ SOLN
0.2500 mg | INTRAMUSCULAR | Status: DC | PRN
  Administered 2020-12-27 (×2): 0.5 mg via INTRAVENOUS

## 2020-12-27 MED ORDER — CHLORHEXIDINE GLUCONATE CLOTH 2 % EX PADS
6.0000 | MEDICATED_PAD | Freq: Every day | CUTANEOUS | Status: DC
  Administered 2020-12-27 – 2021-01-05 (×9): 6 via TOPICAL

## 2020-12-27 MED ORDER — DIPHENHYDRAMINE HCL 25 MG PO CAPS
25.0000 mg | ORAL_CAPSULE | Freq: Four times a day (QID) | ORAL | Status: DC | PRN
  Filled 2020-12-27: qty 1

## 2020-12-27 MED ORDER — SODIUM CHLORIDE 0.9 % IV SOLN
6.0000 mg/kg | Freq: Every day | INTRAVENOUS | Status: DC
  Filled 2020-12-27: qty 13

## 2020-12-27 MED ORDER — PROPOFOL 10 MG/ML IV BOLUS
INTRAVENOUS | Status: DC | PRN
  Administered 2020-12-27: 300 mg via INTRAVENOUS

## 2020-12-27 MED ORDER — ATORVASTATIN CALCIUM 40 MG PO TABS: 40.0000 mg | ORAL_TABLET | Freq: Every day | ORAL | Status: DC

## 2020-12-27 MED ORDER — LISINOPRIL 2.5 MG PO TABS
2.5000 mg | ORAL_TABLET | Freq: Every day | ORAL | Status: DC
  Filled 2020-12-27: qty 1

## 2020-12-27 MED ORDER — MEPERIDINE HCL 50 MG/ML IJ SOLN: 6.2500 mg | INTRAMUSCULAR | Status: DC | PRN

## 2020-12-27 MED ORDER — NYSTATIN 100000 UNIT/GM EX POWD
Freq: Three times a day (TID) | CUTANEOUS | Status: DC
  Administered 2020-12-27: 1 via TOPICAL
  Filled 2020-12-27 (×4): qty 15

## 2020-12-27 SURGICAL SUPPLY — 30 items
ADH SKN CLS APL DERMABOND .7 (GAUZE/BANDAGES/DRESSINGS) ×1
APL PRP STRL LF DISP 70% ISPRP (MISCELLANEOUS) ×1
BLADE SURG SZ11 CARB STEEL (BLADE) ×2 IMPLANT
CHLORAPREP W/TINT 26 (MISCELLANEOUS) ×2 IMPLANT
COVER SURGICAL LIGHT HANDLE (MISCELLANEOUS) ×2 IMPLANT
COVER WAND RF STERILE (DRAPES) IMPLANT
DECANTER SPIKE VIAL GLASS SM (MISCELLANEOUS) IMPLANT
DERMABOND ADVANCED (GAUZE/BANDAGES/DRESSINGS) ×1
DERMABOND ADVANCED .7 DNX12 (GAUZE/BANDAGES/DRESSINGS) ×1 IMPLANT
DRAPE LAPAROSCOPIC ABDOMINAL (DRAPES) IMPLANT
DRAPE LAPAROTOMY TRNSV 102X78 (DRAPES) IMPLANT
DRSG PAD ABDOMINAL 8X10 ST (GAUZE/BANDAGES/DRESSINGS) IMPLANT
ELECT REM PT RETURN 15FT ADLT (MISCELLANEOUS) ×2 IMPLANT
GAUZE SPONGE 4X4 12PLY STRL (GAUZE/BANDAGES/DRESSINGS) IMPLANT
GLOVE SURG ENC MOIS LTX SZ6 (GLOVE) ×2 IMPLANT
GLOVE SURG MICRO LTX SZ6 (GLOVE) ×2 IMPLANT
GLOVE SURG POLYISO LF SZ7 (GLOVE) ×2 IMPLANT
GLOVE SURG UNDER LTX SZ6.5 (GLOVE) ×2 IMPLANT
GOWN STRL REUS W/TWL LRG LVL3 (GOWN DISPOSABLE) ×2 IMPLANT
GOWN STRL REUS W/TWL XL LVL3 (GOWN DISPOSABLE) ×2 IMPLANT
KIT BASIN OR (CUSTOM PROCEDURE TRAY) ×2 IMPLANT
KIT TURNOVER KIT A (KITS) ×2 IMPLANT
PACK GENERAL/GYN (CUSTOM PROCEDURE TRAY) ×2 IMPLANT
PENCIL SMOKE EVACUATOR (MISCELLANEOUS) IMPLANT
SURGILUBE 2OZ TUBE FLIPTOP (MISCELLANEOUS) IMPLANT
SUT MNCRL AB 4-0 PS2 18 (SUTURE) IMPLANT
SWAB COLLECTION DEVICE MRSA (MISCELLANEOUS) IMPLANT
SWAB CULTURE ESWAB REG 1ML (MISCELLANEOUS) IMPLANT
TOWEL OR 17X26 10 PK STRL BLUE (TOWEL DISPOSABLE) ×2 IMPLANT
TOWEL OR NON WOVEN STRL DISP B (DISPOSABLE) ×2 IMPLANT

## 2020-12-27 NOTE — Anesthesia Postprocedure Evaluation (Signed)
Anesthesia Post Note  Patient: Karen Bennett  Procedure(s) Performed: INCISION AND DRAINAGE LEFT THIGH ABSCESS (Left Thigh)     Patient location during evaluation: PACU Anesthesia Type: General Level of consciousness: awake and alert, patient cooperative and oriented Pain management: pain level controlled Vital Signs Assessment: post-procedure vital signs reviewed and stable Respiratory status: spontaneous breathing, nonlabored ventilation, respiratory function stable and patient connected to nasal cannula oxygen Cardiovascular status: blood pressure returned to baseline and stable Postop Assessment: no apparent nausea or vomiting Anesthetic complications: no   No complications documented.  Last Vitals:  Vitals:   12/27/20 1000 12/27/20 1015  BP: 136/67 124/68  Pulse: 93 84  Resp: (!) 26 20  Temp: 36.7 C   SpO2: 92% 98%    Last Pain:  Vitals:   12/27/20 1015  TempSrc:   PainSc: 4                  Amardeep Beckers,E. Tangala Wiegert

## 2020-12-27 NOTE — Progress Notes (Signed)
Patient's CBG 387 at HS but patient just finished eating. Paged Linton Flemings. New order for one time dose of 5 units novolog ordered and given. Will continue to monitor.

## 2020-12-27 NOTE — Transfer of Care (Signed)
Immediate Anesthesia Transfer of Care Note  Patient: Karen Bennett  Procedure(s) Performed: INCISION AND DRAINAGE LEFT THIGH ABSCESS (Left Thigh)  Patient Location: PACU  Anesthesia Type:General  Level of Consciousness: awake  Airway & Oxygen Therapy: Patient Spontanous Breathing and Patient connected to face mask oxygen  Post-op Assessment: Report given to RN and Post -op Vital signs reviewed and stable  Post vital signs: Reviewed and stable  Last Vitals:  Vitals Value Taken Time  BP 136/67 12/27/20 1000  Temp 36.7 C 12/27/20 1000  Pulse 88 12/27/20 1002  Resp 23 12/27/20 1002  SpO2 100 % 12/27/20 1002  Vitals shown include unvalidated device data.  Last Pain:  Vitals:   12/27/20 0713  TempSrc:   PainSc: 4       Patients Stated Pain Goal: 5 (12/27/20 0713)  Complications: No complications documented.

## 2020-12-27 NOTE — Anesthesia Procedure Notes (Signed)
Procedure Name: Intubation Performed by: Ezekiel Ina, CRNA Pre-anesthesia Checklist: Patient identified, Emergency Drugs available, Suction available and Patient being monitored Patient Re-evaluated:Patient Re-evaluated prior to induction Oxygen Delivery Method: Circle System Utilized Preoxygenation: Pre-oxygenation with 100% oxygen Induction Type: IV induction, Rapid sequence and Cricoid Pressure applied Laryngoscope Size: Glidescope and 4 Grade View: Grade I Tube type: Oral Tube size: 7.0 mm Number of attempts: 1 Airway Equipment and Method: Stylet Placement Confirmation: ETT inserted through vocal cords under direct vision,  positive ETCO2 and breath sounds checked- equal and bilateral Secured at: 22 cm Tube secured with: Tape Dental Injury: Teeth and Oropharynx as per pre-operative assessment  Difficulty Due To: Difficulty was anticipated, Difficult Airway- due to large tongue and Difficult Airway-  due to edematous airway Future Recommendations: Recommend- induction with short-acting agent, and alternative techniques readily available

## 2020-12-27 NOTE — Op Note (Signed)
Operative Note  Karen Bennett  [id number]  [id number]  12/27/2020   Surgeon: Leeroy Bock A ConnorMD  Procedure performed: incision and excisional debridement left thigh soft tissue infection  Preop diagnosis: soft tissue infection, left thigh Post-op diagnosis/intraop findings: same  Specimens: cultures Retained items: kerlix packing EBL: 20cc Complications: none  Description of procedure: After obtaining informed consent the patient was taken to the operating room and placed supine on operating room table wheregeneral endotracheal anesthesia was initiated, preoperative antibiotics were administered, SCDs applied, and a formal timeout was performed.  The left thigh was prepped and draped in usual sterile fashion.  There are 2 ulcerated areas on the lateral aspect of the distal left thigh, the larger of which was incised and the wound probed with a culture swab.  The ulcerated areas were then excised with cautery, removing an approximately 8 cm diameter wedge of skin and subcutaneous tissue.  The abscess cavity was probed, it does track anterior medially about 10 cm, but there feels to be good tissue integrity and the fascia is intact.  Minimal fluid was remaining in the abscess cavity as it had drained spontaneously.  Some necrotic tissue superficially was debrided.  Hemostasis was achieved with cautery, she does have some superficial venous hypertension which made this a little bit difficult.  The wound was then packed with a Betadine soaked Kerlix and covered with a dry dressing.  The patient was then awakened, extubated and taken to PACU in stable condition.     All counts were correct at the completion of the case.

## 2020-12-27 NOTE — Progress Notes (Addendum)
Inpatient Diabetes Program Recommendations  AACE/ADA: New Consensus Statement on Inpatient Glycemic Control (2015)  Target Ranges:  Prepandial:   less than 140 mg/dL      Peak postprandial:   less than 180 mg/dL (1-2 hours)      Critically ill patients:  140 - 180 mg/dL   Lab Results  Component Value Date   GLUCAP 178 (H) 12/27/2020   HGBA1C 7.5% 06/26/2013    Review of Glycemic Control  Diabetes history: DM 2 Outpatient Diabetes medications: Semglee (insulin glargine) 60 units Daily, Janumet [phone] mg Daily, Humalog 0-12 units bid Current orders for Inpatient glycemic control:  IV insulin  A1c pending will result in approx 3 days. Glucose on presentation 648. Will see pt this admission  Inpatient Diabetes Program Recommendations:    At time of transition consider: -  Lantus 60 units (0.3 units/kg) -  Novolog 0-20 units + hs -  Novolog 5 units tid if eating >50% of meals  Addendum  2:33 pm: Spoke with pt at bedside regarding A1c and glucose control at home. The last time pt checked her glucose was 1 week ago. Due to pt feeling ill she did not take her insulin consistently over the past week. I do not expect her A1c to be within goal. Pt reports the last time she had her A1c checked was over 1 year ago. She sees a NP for DM management  And had an appt today that she had to cancel. Her last visit was a tele health visit without lab work.  Discussed glucose and A1c goals for d/c. Spoke with pt about the expectations of being on IV insulin and transitioning to SQ regimen while inpatient. Will dose insulin according to pt needs this admission for better control outpatient.  Thanks,  Christena Deem RN, MSN, BC-ADM Inpatient Diabetes Coordinator Team Pager [phone] (8a-5p)

## 2020-12-27 NOTE — H&P (Addendum)
History and Physical    Karen Bennett CZY:[id number] DOB: 1975-08-30 DOA: 12/26/2020  PCP: Pcp, No  Patient coming from: Home  I have personally briefly reviewed patient's old medical records in [facility]  Chief Complaint: left leg infection  HPI: Karen Bennett is a 45 y.o. 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  Patient noticed about a week ago what she thought was a bruise to her left distal lateral leg.  However in the past 2 days she has noticed blisters that have popped.  Having spreading redness and increased pain.  Denies any fever.  Has had occasional nausea and vomiting.  She has just felt weak and rundown.  Has had decreased p.o. intake.  Has not taken her insulin for several days.  She also smokes about 4 cigarettes/day.  ED Course: She had temperature of 99.1, hypertensive up to systolic of 093A.  CBC negative for leukocytosis.  Lactate 1.1.  Sodium 121.  BG 648.  No anion gap.  Creatinine 1.04.  CT of the lower extremity was concerning for necrotizing fasciitis.  She was evaluated at bedside by Dr. Marlou Starks with general surgery who recommended incision and drainage in the morning following aggressive fluid and glucose management.  Review of Systems: Constitutional: No Weight Change, No Fever ENT/Mouth: No sore throat, No Rhinorrhea Eyes: No Eye Pain, No Vision Changes Cardiovascular: No Chest Pain, no SOB Respiratory: No Cough, No Sputum Gastrointestinal: + Nausea, +Vomiting, No Diarrhea, No Constipation, No Pain Genitourinary: no Urinary Incontinence, No Urgency, No Flank Pain Musculoskeletal: No Arthralgias, No Myalgias Skin: No Skin Lesions, No Pruritus, Neuro: no Weakness, No Numbness Psych: No Anxiety/Panic, No  Depression, no decrease appetite Heme/Lymph: No Bruising, No Bleeding  Past Medical History:  Diagnosis Date  . Cellulitis   . Diabetes mellitus   . PE (pulmonary embolism) ~2007-2008   Not on anticoagulation    Past Surgical History:  Procedure Laterality Date  . Abscess removal from L groin    . APPLICATION OF WOUND VAC Left 05/01/2013   Procedure: APPLICATION OF WOUND VAC;  Surgeon: Jessy Oto, MD;  Location: [facility];  Service: Orthopedics;  Laterality: Left;  . I & D EXTREMITY  11/12/2011   Procedure: IRRIGATION AND DEBRIDEMENT EXTREMITY;  Surgeon: Mcarthur Rossetti, MD;  Location: [facility];  Service: Orthopedics;  Laterality: Left;  foot left  . I & D EXTREMITY Left 09/06/2012   Procedure: IRRIGATION AND DEBRIDEMENT EXTREMITY;  Surgeon: Marybelle Killings, MD;  Location: [facility];  Service: Orthopedics;  Laterality: Left;  . I & D EXTREMITY Left 05/01/2013   Procedure: INCISION AND DRAINAGE LEFT FOREFOOT ABCESS ;  Surgeon: Jessy Oto, MD;  Location: [facility];  Service: Orthopedics;  Laterality: Left;  . Surgery to remove hematoma in L leg    . 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     reports that she has never smoked. She has never used smokeless tobacco. She reports that she does not drink alcohol and does not use drugs. Social History  Allergies  Allergen Reactions  . Clindamycin Nausea And Vomiting and Nausea Only  . Zofran Itching and Nausea And Vomiting  . Doxycycline Nausea And Vomiting  . Morphine And Related Itching    Family History  Problem  Relation Age of Onset  . Diabetes Mother   . Coronary artery disease Mother   . Stroke Mother      Prior to Admission medications   Medication Sig Start Date End Date Taking? Authorizing Provider  atorvastatin (LIPITOR) 40 MG tablet Take 1 tablet (40 mg total) by mouth daily at 6 PM. 05/12/13  Yes Buriev, Arie Sabina, MD  buPROPion (WELLBUTRIN XL) 300 MG 24 hr tablet Take 300 mg by mouth daily.   Yes [provider]   furosemide (LASIX) 40 MG tablet Take 40 mg by mouth daily. 11/12/20  Yes [provider]  glucose blood test strip Use as instructed 06/26/13  Yes Advani, Deepak, MD  glucose monitoring kit (FREESTYLE) monitoring kit 1 each by Does not apply route as needed for other. 06/26/13  Yes Advani, Vernon Prey, MD  HUMALOG KWIKPEN 100 UNIT/ML KwikPen Inject subcutaneously twice daily as per sliding scale: if 70-150= 0 units; 151-200= 2 u; 201-250= 4 u; 251-300= 6 u; 301-350= 8 u; 351-400= 10 u; 401-450= 12u; anything greater than 400 call physician. 10/27/20  Yes [provider]  hydrOXYzine (VISTARIL) 50 MG capsule Take 50 mg by mouth every 6 (six) hours as needed for itching.   Yes [provider]  Iron-FA-B Cmp-C-Biot-Probiotic (FUSION PLUS) CAPS Take 1 capsule by mouth daily.   Yes [provider]  lisinopril (ZESTRIL) 2.5 MG tablet Take 2.5 mg by mouth daily.   Yes [provider]  omeprazole (PRILOSEC) 20 MG capsule Take 1 capsule by mouth daily. 11/12/20  Yes [provider]  potassium chloride (KLOR-CON) 10 MEQ tablet Take 10 mEq by mouth daily. 11/12/20  Yes [provider]  SEMGLEE, YFGN, 100 UNIT/ML SOPN Inject 60 Units into the skin daily. 10/29/20  Yes [provider]  SitaGLIPtin-MetFORMIN HCl (JANUMET XR) [phone] MG TB24 Take 1 tablet by mouth daily.   Yes [provider]  traZODone (DESYREL) 50 MG tablet Take 25 mg by mouth at bedtime.   Yes [provider]  venlafaxine XR (EFFEXOR-XR) 75 MG 24 hr capsule Take 75 mg by mouth daily. 11/12/20  Yes [provider]  XARELTO 20 MG TABS tablet Take 20 mg by mouth daily. 11/12/20  Yes [provider]  aspirin EC 325 MG tablet Take 1 tablet (325 mg total) by mouth 2 (two) times daily. Patient not taking: Reported on 12/26/2020 05/12/13   Jessy Oto, MD  ferrous sulfate 325 (65 FE) MG tablet Take 1 tablet (325 mg total) by mouth 3 (three) times daily after  meals. Patient not taking: Reported on 12/26/2020 05/12/13   Kinnie Feil, MD  insulin aspart (NOVOLOG) 100 UNIT/ML injection Inject 5 Units into the skin once. For blood sugar >150 Patient not taking: No sig reported 09/17/13   Angelica Chessman E, MD  insulin glargine (LANTUS) 100 UNIT/ML injection Inject 0.4 mLs (40 Units total) into the skin daily. Patient not taking: No sig reported 09/17/13   Tresa Garter, MD    Physical Exam: Vitals:   12/26/20 1843 12/26/20 1900 12/26/20 2053 12/26/20 2218  BP:  (!) 163/56 (!) 152/67 (!) 166/62  Pulse:  94 96 79  Resp:  _0 Temp:      TempSrc:      SpO2:  94% 91% 96%  Weight: (!) 182.3 kg     Height: _1  (1.676 m)       Constitutional: NAD, calm, comfortable, non toxic appearing obesity female sitting up in  bed Vitals:   12/26/20 1843 12/26/20 1900 12/26/20 2053 12/26/20 2218  BP:  (!) 163/56 (!) 152/67 (!) 166/62  Pulse:  94 96 79  Resp:  _0 Temp:      TempSrc:      SpO2:  94% 91% 96%  Weight: (!) 182.3 kg     Height: _1  (1.676 m)      Eyes: PERRL, lids and conjunctivae normal ENMT: Mucous membranes are moist.  Neck: normal, supple Respiratory: clear to auscultation bilaterally, no wheezing, no crackles. Normal respiratory effort. No accessory muscle use.  Cardiovascular: Regular rate and rhythm, no murmurs / rubs / gallops. No extremity edema. 2+ pedal pulses. Abdomen: no tenderness, no masses palpatedy. Bowel sounds positive.  Musculoskeletal: no clubbing / cyanosis.Right BKA.  Skin: Large erythematous indurated area with 2 circular ulcerations to distal lateral left lower extremity.  Pain not significantly out of portion with exam.    Neurologic: CN 2-12 grossly intact. Sensation intact. Strength 5/5 in all 4.  Psychiatric: Normal judgment and insight. Alert and oriented x 3. Normal mood.    Labs on Admission: I have personally reviewed following labs and imaging studies  CBC: Recent Labs   Lab 12/26/20 2035  WBC 6.9  NEUTROABS 4.8  HGB 12.9  HCT 38.3  MCV 86.7  PLT 779   Basic Metabolic Panel: Recent Labs  Lab 12/26/20 2035  NA 121*  K 4.0  CL 90*  CO2 23  GLUCOSE 648*  BUN 20  CREATININE 1.04*  CALCIUM 8.3*   GFR: Estimated Creatinine Clearance: 119.5 mL/min (A) (by C-G formula based on SCr of 1.04 mg/dL (H)). Liver Function Tests: Recent Labs  Lab 12/26/20 2035  AST 8*  ALT 10  ALKPHOS 77  BILITOT 0.6  PROT 7.2  ALBUMIN 2.2*   No results for input(s): LIPASE, AMYLASE in the last 168 hours. No results for input(s): AMMONIA in the last 168 hours. Coagulation Profile: No results for input(s): INR, PROTIME in the last 168 hours. Cardiac Enzymes: No results for input(s): CKTOTAL, CKMB, CKMBINDEX, TROPONINI in the last 168 hours. BNP (last 3 results) No results for input(s): PROBNP in the last 8760 hours. HbA1C: No results for input(s): HGBA1C in the last 72 hours. CBG: Recent Labs  Lab 12/26/20 1937  GLUCAP 589*   Lipid Profile: No results for input(s): CHOL, HDL, LDLCALC, TRIG, CHOLHDL, LDLDIRECT in the last 72 hours. Thyroid Function Tests: No results for input(s): TSH, T4TOTAL, FREET4, T3FREE, THYROIDAB in the last 72 hours. Anemia Panel: No results for input(s): VITAMINB12, FOLATE, FERRITIN, TIBC, IRON, RETICCTPCT in the last 72 hours. Urine analysis:    Component Value Date/Time   COLORURINE AMBER (A) 09/05/2011 0558   APPEARANCEUR TURBID (A) 09/05/2011 0558   LABSPEC 1.024 09/05/2011 0558   PHURINE 5.5 09/05/2011 0558   GLUCOSEU 250 (A) 09/05/2011 0558   HGBUR LARGE (A) 09/05/2011 0558   BILIRUBINUR SMALL (A) 09/05/2011 0558   KETONESUR TRACE (A) 09/05/2011 0558   PROTEINUR 100 (A) 09/05/2011 0558   UROBILINOGEN 0.2 09/05/2011 0558   NITRITE POSITIVE (A) 09/05/2011 0558   LEUKOCYTESUR LARGE (A) 09/05/2011 0558    Radiological Exams on Admission: CT EXTREMITY LOWER LEFT W CONTRAST  Result Date: 12/26/2020 CLINICAL DATA:   Soft tissue infection suspected, lower leg, xray done L leg infection just proximal to the knee EXAM: CT OF THE LOWER LEFT EXTREMITY (down to the knee) WITH CONTRAST TECHNIQUE: Multidetector CT imaging of the lower left extremity (down to  the knee) was performed according to the standard protocol following intravenous contrast administration. CONTRAST:  193m OMNIPAQUE IOHEXOL 300 MG/ML  SOLN COMPARISON:  None. FINDINGS: Bones/Joint/Cartilage No cortical erosion or destruction. No acute displaced fracture or dislocation. Mild to moderate tricompartmental degenerative changes of the knee. Trace left knee effusion. No severe arthropathy of the hip. Ligaments Suboptimally assessed by CT. Muscles and Tendons Atrophic musculature. Question mild edema along the deep fascial layers (4:224-226.) Soft tissues Subcutaneus soft tissue edema and emphysema along the proximal to mid anterolateral left thigh subcutaneus soft tissues. Overlying dermal thickening. No organized fluid collection. Subcutaneus soft tissue edema and dermal thickening along the posteromedial left thigh and visualized posteromedial leg. Other: At least moderate calcified atherosclerotic plaque of the arteries with limited evaluation due to timing of contrast. Varicose veins noted. IMPRESSION: 1. Subcutaneus soft tissue edema and emphysema of the left thigh. Findings consistent with infection with concern for a necrotizing fasciitis. Please correlate clinically as this is a clinical diagnosis. 2. No organized fluid collection. 3. Atherosclerotic plaque. 4. Please note the left leg was not imaged (only thigh). These results were called by telephone at the time of interpretation on 12/26/2020 at 10:31 pm [facility] who verbally acknowledged these results. Electronically Signed   By: MIven FinnM.D.   On: 12/26/2020 22:35      Assessment/Plan  Abscess/Cellulitis of the left lower extremity -CT LE concerning for necrotizing  fasciitis.  Patient did not have notable pain out of proportion on exam but exam is difficult given her super morbid obese habitus. -Surgery Dr. TMarlou Starkshas evaluated bedside and recommends taking for incision and drainage tomorrow after aggressive fluid and glucose control -Continue IV vancomycin and Zosyn  Type 2 diabetes s/p right BKA with hyperglycemia -No anion gap. Obtain HbA1C. - replete potassium as needed - continue insulin gtt with goal of 140-180 and AG <12 - IV NS until BG <250, then switch to D5 1/2 NS  - BMP q4hr  - keep NPO  Hypertension -continue home med when able to have PO  Super morbid obesity BMI of 64 Complicates clinical course  Depression/anxiety -continue home antidepressants when able to have PO  Hx of PE/DVT -last imaging on record in Epic from 2009 with PE and noted DVT in 2019  -states she has not been taking Xarelto. Continue to hold for now for I&D tomorrow   DVT prophylaxis:.Lovenox Code Status: Full Family Communication: Plan discussed with patient at bedside  disposition Plan: Home with observation Consults called: General surgery Admission status: Observation  Level of care: Progressive  Status is: Observation  The patient remains OBS appropriate and will d/c before 2 midnights.  Dispo: The patient is from: Home              Anticipated d/c is to: Home              Patient currently is not medically stable to d/c.   Difficult to place patient No         COrene DesanctisDO [facility]   If 7PM-7AM, please contact night-coverage [email or web address]   12/27/2020, 12:15 AM

## 2020-12-27 NOTE — Progress Notes (Signed)
Pharmacy Antibiotic Note  Karen Bennett is a 45 y.o. female admitted on 12/26/2020 with cellulitis.  Pharmacy has been consulted for Vancomycin & Zosyn dosing. 6/5 CT of L thigh suspicious for necrotizing fascititis Noted plans to go to OR for I&D once glucose improves.   Plan: Zosyn 3.375gm IV Q8h to be infused over 4hrs Vancomycin 2gm IV q12h to target AUC 400-550 Check Vancomycin levels at steady state Monitor renal function and cx data  Daily Scr while on Vanc/Zosyn combination    Height: 5\' 6"  (167.6 cm) Weight: (!) 182.3 kg (402 lb) IBW/kg (Calculated) : 59.3  Temp (24hrs), Avg:99.1 F (37.3 C), Min:99.1 F (37.3 C), Max:99.1 F (37.3 C)  Recent Labs  Lab 12/26/20 1856 12/26/20 2035  WBC  --  6.9  CREATININE  --  1.04*  LATICACIDVEN 1.1  --     Estimated Creatinine Clearance: 119.5 mL/min (A) (by C-G formula based on SCr of 1.04 mg/dL (H)).    Allergies  Allergen Reactions  . Clindamycin Nausea And Vomiting and Nausea Only  . Zofran Itching and Nausea And Vomiting  . Doxycycline Nausea And Vomiting  . Morphine And Related Itching    Antimicrobials this admission: 6/5 Vancomycin >>  6/5 Zosyn >>   Dose adjustments this admission:  Microbiology results: 6/5 BCx:  6/5 Resp PCR: negative for COVID/influenza 6/6 Wound cx: I&D pending  Thank you for allowing pharmacy to be a part of this patient's care.  8/5 PharmD, BCPS 12/27/2020 12:31 AM

## 2020-12-27 NOTE — Progress Notes (Addendum)
Pharmacy Antibiotic Note  Karen Bennett is a 45 y.o. female admitted on 12/26/2020 with cellulitis.  Pharmacy has been consulted for Vancomycin & Zosyn dosing. 6/5 CT of L thigh suspicious for necrotizing fascititis 12/27/2020 SCr up to 1.33> to change vancomycin to daptomycin Tm 99.1, WBC 6.1 S/p ID of L thigh abscess- minimal fluid as had drained spontaneously   Plan: Continue Zosyn 3.375gm IV Q8h to be infused over 4hrs DC vancomycin Daptomycin 6 mg/kg IBW = 650 mg IV q24 DC lipitor while on daptomycin> please resume once off dapto F/u renal function, WBC, temp, cultures CK q Tuesday  Height: 5\' 6"  (167.6 cm) Weight: (!) 182.3 kg (402 lb) IBW/kg (Calculated) : 59.3  Temp (24hrs), Avg:98.6 F (37 C), Min:98.1 F (36.7 C), Max:99.1 F (37.3 C)  Recent Labs  Lab 12/26/20 1856 12/26/20 2035 12/27/20 0306 12/27/20 0315 12/27/20 0316 12/27/20 1100  WBC  --  6.9  --  6.1  --   --   CREATININE  --  1.04* 1.00  --  1.08* 1.33*  LATICACIDVEN 1.1  --   --   --   --  1.8    Estimated Creatinine Clearance: 93.4 mL/min (A) (by C-G formula based on SCr of 1.33 mg/dL (H)).    Allergies  Allergen Reactions  . Clindamycin Nausea And Vomiting and Nausea Only  . Zofran Itching and Nausea And Vomiting  . Doxycycline Nausea And Vomiting  . Morphine And Related Itching   Antimicrobials this admission: 6/5 Vancomycin >> 6/6 6/5 Zosyn >>  6/6 dapto>> Dose adjustments this admission:  Microbiology results: 6/5 BCx: sent 6/5 Resp PCR: negative for COVID/influenza 6/6 Wound cx: I&D: sent  Thank you for allowing pharmacy to be a part of this patient's care.   8/5, Pharm.D 12/27/2020 12:33 PM

## 2020-12-27 NOTE — Progress Notes (Addendum)
PROGRESS NOTE    Karen Bennett  XFG:[id number] DOB: 25-Feb-1976 DOA: 12/26/2020 PCP: Pcp, No    Brief Narrative:  Karen Bennett was admitted with the working diagnosis of left distal thigh necrotizing fascitis, complicated with uncontrolled hyperglycemia.   45 year old female past medical history for type 2 diabetes mellitus, diabetic neuropathy, history of PE/DVT, status post right BKA, hypertension, dyslipidemia, depression, anxiety and obesity class III who presented with left lower extremity pain.  Reported 7 days of erythema at the distal left thigh, that progressed over the last 2 days to blistering lesions, associated with spreading painful erythema.  On her initial physical examination blood pressure 163/56, heart rate 94, respiratory rate 18, oxygen saturation 96% her lungs were clear to auscultation bilaterally, heart S1-S2, present, rhythmic, soft abdomen, left lower extremity with large erythematous lesion, ill-defined borders, with central circular ruptured blebs with dark scars.   Sodium 121, potassium 4.0, chloride 90, bicarb 23, glucose 648, BUN 20, creatinine 1.0, anion gap 8, white count 6.9, hemoglobin 12.9, hematocrit 38.3, platelets 263. SARS COVID-19 negative.  Urinalysis specific gravity 1.027, 100 protein, > 500 glucose, > 50 white cells.  Left leg CT with subcutaneous soft tissue edema and emphysema on the left thigh, findings consistent with necrotizing fasciitis.  No organized fluid collection.  Patient was placed on broad-spectrum IV antibiotic therapy and intravenous fluids.   Surgery was consulted and underwent incision and drainage, debridement left thigh soft tissue infection on 12/27/20.   Assessment & Plan:   Principal Problem:   Necrotizing fasciitis of lower leg (HCC) Thigh  Active Problems:   DM (diabetes mellitus), type 2, uncontrolled with complications (HCC)   Anxiety and depression   Cellulitis   BMI 60.0-69.9, adult (HCC)   HTN (hypertension)    History of pulmonary embolism   1. Left distal lateral thigh necrotizing fascitis. (no sepsis). Sp I&D with drainage.  Pain is controlled with IV hydromorphone, she has remained afebrile and blood cultures still pending.   Plan to continue antibiotic therapy with Daptomycin and Zosyn for now, follow on wound cultures, cell count and temperature curve. Continue supportive medical care with IV fluids, analgesics and wound care per surgery team.   2. Uncontrolled hyperglycemia. (no keto acidosis). Patient has been on insulin drip 4 units per hr for the last few hours.  At home on 60 units long acting insulin daily and insulin sliding scale plus pre-meal insulin. Her last Hgb A1c was 7,5 on 2014.   Patient with no nausea or vomiting, no anion gap.  Plan to transition to sq insulin 20 units bid glargine and aspart sliding scale with close glucose monitoring.   3. HTN. Continue blood pressure monitoring. Decrease IV fluids to 75 ml per H for now.  Blood pressure 111/83 and 1632/77 mmHg. Continue to hold on lisinopril for now.   4. Obesity class 3. depression Calculated BMI is 64. Will need life style modification and outpatient follow up.   Continue with venlafaxine and trazodone   5. Hx of DVT and PE. Not on anticoagulation at home.   6. AKI, hyponatremia. Renal function with serum cr up to 1,33 with K at 3,7 and serum Na 127 with Cl at 95.  Continue hydration with isotonic saline at 75 ml per H, follow up renal function in am, avoid hypotension and nephrotoxic medications.    Patient continue to be at high risk for worsening necrotizing fascitis.   Status is: Inpatient  Remains inpatient appropriate because:IV treatments appropriate due to  intensity of illness or inability to take PO   Dispo: The patient is from: Home              Anticipated d/c is to: Home              Patient currently is not medically stable to d/c.   Difficult to place patient No   DVT  prophylaxis: Enoxaparin   Code Status:   full  Family Communication:  No family at the bedside     Consultants:   Surgery   Procedures:   Left thigh I&D plus drainage   Antimicrobials:   daptomycin and zosyn     Subjective: Patient is feeling better, left thigh pain controlled with analgesics, no nausea or vomiting.   Objective: Vitals:   12/27/20 1000 12/27/20 1015 12/27/20 1030 12/27/20 1045  BP: 136/67 124/68 (!) 123/56 111/83  Pulse: 93 84 80 81  Resp: (!) 26 20 16 16   Temp: 98.1 F (36.7 C)   98.7 F (37.1 C)  TempSrc:      SpO2: 92% 98% 96% 98%  Weight:      Height:        Intake/Output Summary (Last 24 hours) at 12/27/2020 1234 Last data filed at 12/27/2020 1045 Gross per 24 hour  Intake 730.91 ml  Output 50 ml  Net 680.91 ml   Filed Weights   12/26/20 1843 12/27/20 0716  Weight: (!) 182.3 kg (!) 182.3 kg    Examination:   General: Not in pain or dyspnea, deconditioned  Neurology: Awake and alert, non focal  E ENT: mild pallor, no icterus, oral mucosa moist Cardiovascular: No JVD. S1-S2 present, rhythmic, no gallops, rubs, or murmurs. No lower extremity edema. Pulmonary: vesicular breath sounds bilaterally, adequate air movement, no wheezing, rhonchi or rales. Gastrointestinal. Abdomen soft and non tender Skin. Left distal lateral thigh with dressing in place.  Musculoskeletal: no joint deformities     Data Reviewed: I have personally reviewed following labs and imaging studies  CBC: Recent Labs  Lab 12/26/20 2035 12/27/20 0315  WBC 6.9 6.1  NEUTROABS 4.8 3.9  HGB 12.9 12.1  HCT 38.3 36.1  MCV 86.7 85.7  PLT 263 251   Basic Metabolic Panel: Recent Labs  Lab 12/26/20 2035 12/27/20 0306 12/27/20 0316 12/27/20 1100  NA 121* 126* 127* 127*  K 4.0 3.6 4.1 3.7  CL 90* 95* 98 95*  CO2 23 23 18* 24  GLUCOSE 648* 447* 165* 209*  BUN 20 19 19  21*  CREATININE 1.04* 1.00 1.08* 1.33*  CALCIUM 8.3* 8.4* 8.5* 8.5*   GFR: Estimated  Creatinine Clearance: 93.4 mL/min (A) (by C-G formula based on SCr of 1.33 mg/dL (H)). Liver Function Tests: Recent Labs  Lab 12/26/20 2035  AST 8*  ALT 10  ALKPHOS 77  BILITOT 0.6  PROT 7.2  ALBUMIN 2.2*   No results for input(s): LIPASE, AMYLASE in the last 168 hours. No results for input(s): AMMONIA in the last 168 hours. Coagulation Profile: No results for input(s): INR, PROTIME in the last 168 hours. Cardiac Enzymes: No results for input(s): CKTOTAL, CKMB, CKMBINDEX, TROPONINI in the last 168 hours. BNP (last 3 results) No results for input(s): PROBNP in the last 8760 hours. HbA1C: No results for input(s): HGBA1C in the last 72 hours. CBG: Recent Labs  Lab 12/27/20 0652 12/27/20 0805 12/27/20 0934 12/27/20 1028 12/27/20 1210  GLUCAP 196* 176* 178* 232* 201*   Lipid Profile: No results for input(s): CHOL, HDL, LDLCALC, TRIG, CHOLHDL,  LDLDIRECT in the last 72 hours. Thyroid Function Tests: No results for input(s): TSH, T4TOTAL, FREET4, T3FREE, THYROIDAB in the last 72 hours. Anemia Panel: No results for input(s): VITAMINB12, FOLATE, FERRITIN, TIBC, IRON, RETICCTPCT in the last 72 hours.    Radiology Studies: I have reviewed all of the imaging during this hospital visit personally     Scheduled Meds: . buPROPion  300 mg Oral Daily  . Chlorhexidine Gluconate Cloth  6 each Topical Daily  . HYDROmorphone      . nystatin   Topical TID  . pantoprazole  40 mg Oral Daily  . traZODone  25 mg Oral QHS  . venlafaxine XR  75 mg Oral Daily   Continuous Infusions: . DAPTOmycin (CUBICIN)  IV    . dextrose 5% lactated ringers 125 mL/hr at 12/27/20 0843  . insulin 4 Units/hr (12/27/20 0843)  . lactated ringers 100 mL/hr at 12/27/20 0843  . piperacillin-tazobactam (ZOSYN)  IV 0 g (12/27/20 0603)  . vancomycin       LOS: 0 days        Ikeya Brockel Annett Gula, MD

## 2020-12-27 NOTE — ED Notes (Addendum)
Unable to get morning labs due to IV trouble PICC line suggested to MD.

## 2020-12-27 NOTE — Anesthesia Preprocedure Evaluation (Addendum)
Anesthesia Evaluation  Patient identified by MRN, date of birth, ID band Patient awake    Reviewed: Allergy & Precautions, NPO status , Patient's Chart, lab work & pertinent test results  History of Anesthesia Complications Negative for: history of anesthetic complications  Airway Mallampati: II  TM Distance: >3 FB Neck ROM: Full    Dental  (+) Chipped, Dental Advisory Given   Pulmonary Current Smoker and Patient abstained from smoking.,    breath sounds clear to auscultation       Cardiovascular hypertension, Pt. on medications (-) angina+ Peripheral Vascular Disease   Rhythm:Regular Rate:Normal     Neuro/Psych Anxiety Depression Peripheral neuropathy    GI/Hepatic Neg liver ROS, GERD  Medicated and Controlled,nausea and vomiting with this cellulitis infection   Endo/Other  diabetes (glu 196), Insulin Dependent, Oral Hypoglycemic AgentsMorbid obesity  Renal/GU Renal InsufficiencyRenal disease     Musculoskeletal   Abdominal (+) + obese,   Peds  Hematology xarelto   Anesthesia Other Findings   Reproductive/Obstetrics                           Anesthesia Physical Anesthesia Plan  ASA: III  Anesthesia Plan: General   Post-op Pain Management:    Induction: Intravenous  PONV Risk Score and Plan: 3 and Ondansetron, Dexamethasone and Scopolamine patch - Pre-op  Airway Management Planned: Oral ETT  Additional Equipment: None  Intra-op Plan:   Post-operative Plan: Extubation in OR  Informed Consent: I have reviewed the patients History and Physical, chart, labs and discussed the procedure including the risks, benefits and alternatives for the proposed anesthesia with the patient or authorized representative who has indicated his/her understanding and acceptance.     Dental advisory given  Plan Discussed with: CRNA and Surgeon  Anesthesia Plan Comments:        Anesthesia  Quick Evaluation

## 2020-12-27 NOTE — Interval H&P Note (Signed)
History and Physical Interval Note:  12/27/2020 8:03 AM  Karen Bennett  has presented today for surgery, with the diagnosis of left thigh abscess.  The various methods of treatment have been discussed with the patient and family. After consideration of risks, benefits and other options for treatment, the patient has consented to  Procedure(s): INCISION AND DRAINAGE LEFT THIGH ABSCESS (Left) as a surgical intervention.  The patient's history has been reviewed, patient examined, no change in status, stable for surgery.  I have reviewed the patient's chart and labs.  Questions were answered to the patient's satisfaction.     Ellon Marasco Lollie Sails

## 2020-12-28 ENCOUNTER — Encounter (HOSPITAL_COMMUNITY): Payer: Self-pay | Admitting: Surgery

## 2020-12-28 DIAGNOSIS — L89312 Pressure ulcer of right buttock, stage 2: Secondary | ICD-10-CM

## 2020-12-28 DIAGNOSIS — L8990 Pressure ulcer of unspecified site, unspecified stage: Secondary | ICD-10-CM | POA: Insufficient documentation

## 2020-12-28 LAB — GLUCOSE, CAPILLARY
Glucose-Capillary: 385 mg/dL — ABNORMAL HIGH (ref 70–99)
Glucose-Capillary: 370 mg/dL — ABNORMAL HIGH (ref 70–99)
Glucose-Capillary: 301 mg/dL — ABNORMAL HIGH (ref 70–99)

## 2020-12-28 LAB — CBC WITH DIFFERENTIAL/PLATELET
Abs Immature Granulocytes: 0.09 10*3/uL — ABNORMAL HIGH (ref 0.00–0.07)
Basophils Absolute: 0 10*3/uL (ref 0.0–0.1)
Basophils Relative: 0 %
Eosinophils Absolute: 0 10*3/uL (ref 0.0–0.5)
Eosinophils Relative: 0 %
HCT: 38.8 % (ref 36.0–46.0)
Hemoglobin: 12.9 g/dL (ref 12.0–15.0)
Immature Granulocytes: 1 %
Lymphocytes Relative: 16 %
Lymphs Abs: 1.2 10*3/uL (ref 0.7–4.0)
MCH: 28.9 pg (ref 26.0–34.0)
MCHC: 33.2 g/dL (ref 30.0–36.0)
MCV: 86.8 fL (ref 80.0–100.0)
Monocytes Absolute: 0.6 10*3/uL (ref 0.1–1.0)
Monocytes Relative: 9 %
Neutro Abs: 5.5 10*3/uL (ref 1.7–7.7)
Neutrophils Relative %: 74 %
Platelets: 243 10*3/uL (ref 150–400)
RBC: 4.47 MIL/uL (ref 3.87–5.11)
RDW: 13.1 % (ref 11.5–15.5)
WBC: 7.5 10*3/uL (ref 4.0–10.5)
nRBC: 0.3 % — ABNORMAL HIGH (ref 0.0–0.2)

## 2020-12-28 LAB — BASIC METABOLIC PANEL
Anion gap: 11 (ref 5–15)
BUN: 25 mg/dL — ABNORMAL HIGH (ref 6–20)
CO2: 19 mmol/L — ABNORMAL LOW (ref 22–32)
Calcium: 8.4 mg/dL — ABNORMAL LOW (ref 8.9–10.3)
Chloride: 97 mmol/L — ABNORMAL LOW (ref 98–111)
Creatinine, Ser: 1.37 mg/dL — ABNORMAL HIGH (ref 0.44–1.00)
GFR, Estimated: 49 mL/min — ABNORMAL LOW (ref >60)
Glucose, Bld: 423 mg/dL — ABNORMAL HIGH (ref 70–99)
Potassium: 4.6 mmol/L (ref 3.5–5.1)
Sodium: 127 mmol/L — ABNORMAL LOW (ref 135–145)

## 2020-12-28 LAB — HEMOGLOBIN A1C
Hgb A1c MFr Bld: 15.5 % — ABNORMAL HIGH (ref 4.8–5.6)
Mean Plasma Glucose: 398 mg/dL

## 2020-12-28 LAB — CK: Total CK: 51 U/L (ref 38–234)

## 2020-12-28 MED ORDER — ENOXAPARIN SODIUM 40 MG/0.4ML IJ SOSY: 40.0000 mg | PREFILLED_SYRINGE | INTRAMUSCULAR | Status: DC

## 2020-12-28 MED ORDER — ENOXAPARIN SODIUM 80 MG/0.8ML IJ SOSY
80.0000 mg | PREFILLED_SYRINGE | INTRAMUSCULAR | Status: DC
  Filled 2020-12-28: qty 0.8

## 2020-12-28 MED ORDER — RIVAROXABAN 10 MG PO TABS
20.0000 mg | ORAL_TABLET | Freq: Every day | ORAL | Status: DC
  Administered 2020-12-28 – 2021-01-05 (×9): 20 mg via ORAL
  Filled 2020-12-28 (×8): qty 2
  Filled 2020-12-28: qty 1

## 2020-12-28 MED ORDER — ATORVASTATIN CALCIUM 40 MG PO TABS
40.0000 mg | ORAL_TABLET | Freq: Every day | ORAL | Status: DC
  Administered 2020-12-28: 40 mg via ORAL
  Filled 2020-12-28: qty 1

## 2020-12-28 MED ORDER — INSULIN GLARGINE 100 UNIT/ML ~~LOC~~ SOLN
30.0000 [IU] | Freq: Two times a day (BID) | SUBCUTANEOUS | Status: DC
  Administered 2020-12-28 – 2020-12-29 (×2): 30 [IU] via SUBCUTANEOUS
  Filled 2020-12-28 (×3): qty 0.3

## 2020-12-28 MED ORDER — INSULIN ASPART 100 UNIT/ML IJ SOLN
6.0000 [IU] | Freq: Three times a day (TID) | INTRAMUSCULAR | Status: DC
  Administered 2020-12-28 – 2020-12-29 (×5): 6 [IU] via SUBCUTANEOUS

## 2020-12-28 NOTE — Progress Notes (Signed)
1 Day Post-Op  Subjective: Having some pain but well controlled.    ROS: See above, otherwise other systems negative  Objective: Vital signs in last 24 hours: Temp:  [97.4 F (36.3 C)-98.7 F (37.1 C)] 97.6 F (36.4 C) (06/07 0820) Pulse Rate:  [74-95] 89 (06/07 0500) Resp:  [11-31] 24 (06/07 0500) BP: (111-162)/(51-83) 159/51 (06/06 2307) SpO2:  [89 %-98 %] 90 % (06/07 0500) Last BM Date: 12/27/20  Intake/Output from previous day: 06/06 0701 - 06/07 0700 In: 1150 [I.V.:1000; IV Piggyback:150] Out: 1700 [Urine:1650; Blood:50] Intake/Output this shift: No intake/output data recorded.  PE: Ext: LLE wound with decrease in erythema today, still with tenderness and some surrounding induration.  Wound is overall clean with no purulent drainage or further necrotic tissue, black in wound is secondary to cautery.    Lab Results:  Recent Labs    12/27/20 0315 12/28/20 0303  WBC 6.1 7.5  HGB 12.1 12.9  HCT 36.1 38.8  PLT 251 243   BMET Recent Labs    12/27/20 1100 12/28/20 0303  NA 127* 127*  K 3.7 4.6  CL 95* 97*  CO2 24 19*  GLUCOSE 209* 423*  BUN 21* 25*  CREATININE 1.33* 1.37*  CALCIUM 8.5* 8.4*   PT/INR No results for input(s): LABPROT, INR in the last 72 hours. CMP     Component Value Date/Time   NA 127 (L) 12/28/2020 0303   K 4.6 12/28/2020 0303   CL 97 (L) 12/28/2020 0303   CO2 19 (L) 12/28/2020 0303   GLUCOSE 423 (H) 12/28/2020 0303   BUN 25 (H) 12/28/2020 0303   CREATININE 1.37 (H) 12/28/2020 0303   CREATININE 0.70 09/01/2013 0954   CALCIUM 8.4 (L) 12/28/2020 0303   PROT 7.2 12/26/2020 2035   ALBUMIN 2.2 (L) 12/26/2020 2035   AST 8 (L) 12/26/2020 2035   ALT 10 12/26/2020 2035   ALKPHOS 77 12/26/2020 2035   BILITOT 0.6 12/26/2020 2035   GFRNONAA 49 (L) 12/28/2020 0303   GFRNONAA >89 09/01/2013 0954   GFRAA >89 09/01/2013 0954   Lipase     Component Value Date/Time   LIPASE 20 09/05/2011 0120       Studies/Results: CT EXTREMITY  LOWER LEFT W CONTRAST  Result Date: 12/26/2020 CLINICAL DATA:  Soft tissue infection suspected, lower leg, xray done L leg infection just proximal to the knee EXAM: CT OF THE LOWER LEFT EXTREMITY (down to the knee) WITH CONTRAST TECHNIQUE: Multidetector CT imaging of the lower left extremity (down to the knee) was performed according to the standard protocol following intravenous contrast administration. CONTRAST:  OMNIPAQUE IOHEXOL 300 MG/ML  SOLN COMPARISON:  None. FINDINGS: Bones/Joint/Cartilage No cortical erosion or destruction. No acute displaced fracture or dislocation. Mild to moderate tricompartmental degenerative changes of the knee. Trace left knee effusion. No severe arthropathy of the hip. Ligaments Suboptimally assessed by CT. Muscles and Tendons Atrophic musculature. Question mild edema along the deep fascial layers (4:224-226.) Soft tissues Subcutaneus soft tissue edema and emphysema along the proximal to mid anterolateral left thigh subcutaneus soft tissues. Overlying dermal thickening. No organized fluid collection. Subcutaneus soft tissue edema and dermal thickening along the posteromedial left thigh and visualized posteromedial leg. Other: At least moderate calcified atherosclerotic plaque of the arteries with limited evaluation due to timing of contrast. Varicose veins noted. IMPRESSION: 1. Subcutaneus soft tissue edema and emphysema of the left thigh. Findings consistent with infection with concern for a necrotizing fasciitis. Please correlate clinically as this is a  clinical diagnosis. 2. No organized fluid collection. 3. Atherosclerotic plaque. 4. Please note the left leg was not imaged (only thigh). These results were called by telephone at the time of interpretation on 12/26/2020 at 10:31 pm to provider [facility] , who verbally acknowledged these results. Electronically Signed   By: Tish Frederickson M.D.   On: 12/26/2020 22:35    Anti-infectives: Anti-infectives (From  admission, onward)   Start     Dose/Rate Route Frequency Ordered Stop   12/27/20 2000  DAPTOmycin (CUBICIN) 650 mg in sodium chloride 0.9 % IVPB  Status:  Discontinued        6 mg/kg  108.5 kg (Adjusted) 126 mL/hr over 30 Minutes Intravenous Daily 12/27/20 1229 12/27/20 1454   12/27/20 1000  vancomycin (VANCOREADY) IVPB 2000 mg/400 mL  Status:  Discontinued        2,000 mg 200 mL/hr over 120 Minutes Intravenous Every 12 hours 12/27/20 0049 12/27/20 1218   12/27/20 0923  vancomycin (VANCOCIN) 1-5 GM/200ML-% IVPB       Note to Pharmacy: Ramond Craver   : cabinet override      12/27/20 0923 12/27/20 2129   12/27/20 0923  piperacillin-tazobactam (ZOSYN) 3.375 GM/50ML IVPB       Note to Pharmacy: Ramond Craver   : cabinet override      12/27/20 0923 12/27/20 0928   12/27/20 0200  piperacillin-tazobactam (ZOSYN) IVPB 3.375 g        3.375 g 12.5 mL/hr over 240 Minutes Intravenous Every 8 hours 12/27/20 0049     12/26/20 2000  vancomycin (VANCOREADY) IVPB 2000 mg/400 mL        2,000 mg 200 mL/hr over 120 Minutes Intravenous Once 12/26/20 1954 12/27/20 0136   12/26/20 1945  piperacillin-tazobactam (ZOSYN) IVPB 3.375 g        3.375 g 100 mL/hr over 30 Minutes Intravenous  Once 12/26/20 1930 12/27/20 0136   12/26/20 1945  vancomycin (VANCOCIN) 2,000 mg in sodium chloride 0.9 % 500 mL IVPB  Status:  Discontinued        2,000 mg 250 mL/hr over 120 Minutes Intravenous  Once 12/26/20 1931 12/26/20 1953       Assessment/Plan POD 1, s/p I&D of L thigh abscess, Dr. Fredricka Bonine -NS WD dressing changes BID to thigh wound -cx pending, G - rods  -mobilize as able, has BKD on R side -cont abx therapy -WBC remains normal  FEN - carb mod diet VTE - Lovenox ID - zosyn  Morbid obesity DM HTN D/O DVT/PE AKI   LOS: 1 day    Letha Cape , [facility] 12/28/2020, 9:02 AM Please see Amion for pager number during day hours 7:00am-4:30pm or 7:00am -11:30am on weekends

## 2020-12-28 NOTE — Progress Notes (Addendum)
PROGRESS NOTE    Karen Bennett  EHU:[id number] DOB: 1976-07-23 DOA: 12/26/2020 PCP: Pcp, No    Brief Narrative:  Karen Bennett was admitted with the working diagnosis of left distal thigh necrotizing fascitis, complicated with uncontrolled hyperglycemia.   45 year old female past medical history for type 2 diabetes mellitus, diabetic neuropathy, history of PE/DVT, status post right BKA, hypertension, dyslipidemia, depression, anxiety and obesity class III who presented with left lower extremity pain.  Reported 7 days of erythema at the distal left thigh, that progressed over the last 2 days to blistering lesions, associated with spreading painful erythema.  On her initial physical examination blood pressure 163/56, heart rate 94, respiratory rate 18, oxygen saturation 96% her lungs were clear to auscultation bilaterally, heart S1-S2, present, rhythmic, soft abdomen, left distal lateral thigh with large erythematous lesion, ill-defined borders, with central circular ruptured blebs with dark scars.   Sodium 121, potassium 4.0, chloride 90, bicarb 23, glucose 648, BUN 20, creatinine 1.0, anion gap 8, white count 6.9, hemoglobin 12.9, hematocrit 38.3, platelets 263. SARS COVID-19 negative.  Urinalysis specific gravity 1.027, 100 protein, > 500 glucose, > 50 white cells.  Left leg CT with subcutaneous soft tissue edema and emphysema on the left thigh, findings consistent with necrotizing fasciitis.  No organized fluid collection.  Patient was placed on broad-spectrum IV antibiotic therapy and intravenous fluids.   Surgery was consulted and underwent incision and drainage, debridement left thigh soft tissue infection on 12/27/20.    Assessment & Plan:   Principal Problem:   Necrotizing fasciitis of lower leg (HCC) Thigh  Active Problems:   DM (diabetes mellitus), type 2, uncontrolled with complications (HCC)   Anxiety and depression   Cellulitis   BMI 60.0-69.9, adult (HCC)   HTN  (hypertension)   History of pulmonary embolism   Pressure injury of skin    1. Left distal lateral thigh necrotizing fascitis. (no sepsis). Sp I&D with drainage 06/06.  Wbc is 7,5, blood cultures with no growth, wound culture with few gram negative rods.  Wound with dressing in place, no surrounding erythema or local edema.   Patient clinically improving, will continue antibiotic therapy with Zosyn to cover gram negative rods and discontinue daptomycin. Continue to follow on further final cultures and sensitivities.   Follow cell count, cultures and temperature curve.   Continue pain control with hydromorphone and oxycodone.   Ok to transfer to medical ward with no telemetry   2. Uncontrolled hyperglycemia, T2DM. (no keto acidosis). Dyslipidemia Hgb A1c was 7,5 on 2014.  Continue uncontrolled hyperglycemia, fasting glucose is 423 with capillary 387 and 385.  Plan to increase basal insulin to 30 units bid glargine, add 6 units pre-meal aspart and continue with insulin sliding scale.   Continue with atorvastatin,.   3. HTN. Continue blood pressure monitoring. Decrease IV fluids to 75 ml per H for now.  Blood pressure 111/83 and 1632/77 mmHg. Continue to hold on lisinopril for now.   4. Obesity class 3. Depression, stage 2 right buttock pressure ulcer (present on admission) Calculated BMI is 64. Will need life style modification and outpatient follow up.  Patient is not ambulatory, right BKA, uses a motorized scooter at home for mobility.  On venlafaxine and trazodone   5. Hx of DVT and PE. Patient has been taking rivaroxaban as outpatient, will resume anticoagulation. She has poor mobility and increase risk for VTE.    6. AKI, hyponatremia.  Pseudohyponatremia with serum Na 127, corrected for hyperglycemia Na is 135,  renal function with serum cr at 1,37 with K at 4,6 and serum bicarbonate at 19, anion gap is 11.  Continue hydration with isotonic saline at 50 ml per H and  follow up renal function in am, avoid hypotension and nephrotoxic medications.    Patient continue to be at high risk for worsening fascitis, AKI  Status is: Inpatient  Remains inpatient appropriate because:Inpatient level of care appropriate due to severity of illness   Dispo: The patient is from: Home              Anticipated d/c is to: Home              Patient currently is not medically stable to d/c.   Difficult to place patient No   DVT prophylaxis: rivaroxaban   Code Status:   full  Family Communication:  No family at the bedside        Skin Documentation: Pressure Injury 12/27/20 Buttocks Right;Left Stage 2 -  Partial thickness loss of dermis presenting as a shallow open injury with a red, pink wound bed without slough. (Active)  12/27/20 1200  Location: Buttocks  Location Orientation: Right;Left  Staging: Stage 2 -  Partial thickness loss of dermis presenting as a shallow open injury with a red, pink wound bed without slough.  Wound Description (Comments):   Present on Admission:      Consultants:   Surgery   Procedures:   I&D left thigh   Antimicrobials:   Daptomycin (dc)  Zosyn     Subjective: Patient is feeling better, her left thigh pain is controlled with analgesics, no nausea or vomiting. No dyspnea or chest pain. At home she is non ambulatory uses a motorized scooter.   Objective: Vitals:   12/28/20 0400 12/28/20 0500 12/28/20 0820 12/28/20 0950  BP:    (!) 159/79  Pulse:  89  87  Resp: (!) 24 (!) 24  20  Temp: 98.5 F (36.9 C)  97.6 F (36.4 C)   TempSrc: Oral  Oral   SpO2:  90%  95%  Weight:      Height:        Intake/Output Summary (Last 24 hours) at 12/28/2020 1028 Last data filed at 12/28/2020 0445 Gross per 24 hour  Intake 600 ml  Output 1650 ml  Net -1050 ml   Filed Weights   12/26/20 1843 12/27/20 0716  Weight: (!) 182.3 kg (!) 182.3 kg    Examination:   General: Not in pain or dyspnea, deconditioned  Neurology:  Awake and alert, non focal  E ENT: mild pallor, no icterus, oral mucosa moist Cardiovascular: No JVD. S1-S2 present, rhythmic, no gallops, rubs, or murmurs. Non pitting left lower extremity edema. Pulmonary: vesicular breath sounds bilaterally, adequate air movement, no wheezing, rhonchi or rales. Gastrointestinal. Abdomen soft and non tender Skin. Left thigh distal wound with dressing in place, no surrounding erythema or edema.   Musculoskeletal: right BKA      Data Reviewed: I have personally reviewed following labs and imaging studies  CBC: Recent Labs  Lab 12/26/20 2035 12/27/20 0315 12/28/20 0303  WBC 6.9 6.1 7.5  NEUTROABS 4.8 3.9 5.5  HGB 12.9 12.1 12.9  HCT 38.3 36.1 38.8  MCV 86.7 85.7 86.8  PLT 263 251 243   Basic Metabolic Panel: Recent Labs  Lab 12/26/20 2035 12/27/20 0306 12/27/20 0316 12/27/20 1100 12/28/20 0303  NA 121* 126* 127* 127* 127*  K 4.0 3.6 4.1 3.7 4.6  CL 90* 95* 98 95*  97*  CO2 23 23 18* 24 19*  GLUCOSE 648* 447* 165* 209* 423*  BUN 20 19 19  21* 25*  CREATININE 1.04* 1.00 1.08* 1.33* 1.37*  CALCIUM 8.3* 8.4* 8.5* 8.5* 8.4*   GFR: Estimated Creatinine Clearance: 90.7 mL/min (A) (by C-G formula based on SCr of 1.37 mg/dL (H)). Liver Function Tests: Recent Labs  Lab 12/26/20 2035  AST 8*  ALT 10  ALKPHOS 77  BILITOT 0.6  PROT 7.2  ALBUMIN 2.2*   No results for input(s): LIPASE, AMYLASE in the last 168 hours. No results for input(s): AMMONIA in the last 168 hours. Coagulation Profile: No results for input(s): INR, PROTIME in the last 168 hours. Cardiac Enzymes: Recent Labs  Lab 12/28/20 0303  CKTOTAL 51   BNP (last 3 results) No results for input(s): PROBNP in the last 8760 hours. HbA1C: No results for input(s): HGBA1C in the last 72 hours. CBG: Recent Labs  Lab 12/27/20 1323 12/27/20 1458 12/27/20 1641 12/27/20 2127 12/28/20 0739  GLUCAP 166* 221* 213* 387* 385*   Lipid Profile: No results for input(s): CHOL,  HDL, LDLCALC, TRIG, CHOLHDL, LDLDIRECT in the last 72 hours. Thyroid Function Tests: No results for input(s): TSH, T4TOTAL, FREET4, T3FREE, THYROIDAB in the last 72 hours. Anemia Panel: No results for input(s): VITAMINB12, FOLATE, FERRITIN, TIBC, IRON, RETICCTPCT in the last 72 hours.    Radiology Studies: I have reviewed all of the imaging during this hospital visit personally     Scheduled Meds: . acetaminophen  650 mg Oral Q6H  . atorvastatin  40 mg Oral q1800  . buPROPion  300 mg Oral Daily  . Chlorhexidine Gluconate Cloth  6 each Topical Daily  . insulin aspart  0-15 Units Subcutaneous TID WC  . insulin glargine  30 Units Subcutaneous BID  . nystatin   Topical TID  . pantoprazole  40 mg Oral Daily  . rivaroxaban  20 mg Oral Q supper  . traZODone  25 mg Oral QHS  . venlafaxine XR  75 mg Oral Daily   Continuous Infusions: . sodium chloride 75 mL/hr at 12/27/20 2100  . piperacillin-tazobactam (ZOSYN)  IV 3.375 g (12/28/20 0945)     LOS: 1 day        Alynn Ellithorpe 02/27/21, MD

## 2020-12-28 NOTE — Progress Notes (Signed)
ANTICOAGULATION CONSULT NOTE - Initial Consult  Pharmacy Consult for Lovenox Indication: VTE prophylaxis  Allergies  Allergen Reactions  . Clindamycin Nausea And Vomiting and Nausea Only  . Zofran Itching and Nausea And Vomiting  . Doxycycline Nausea And Vomiting  . Morphine And Related Itching    Patient Measurements: Height: 5\' 6"  (167.6 cm) Weight: (!) 182.3 kg (402 lb) IBW/kg (Calculated) : 59.3  Vital Signs: Temp: 98.5 F (36.9 C) (06/07 0400) Temp Source: Oral (06/07 0400) BP: 159/51 (06/06 2307) Pulse Rate: 89 (06/07 0500)  Labs: Recent Labs    12/26/20 2035 12/27/20 0306 12/27/20 0315 12/27/20 0316 12/27/20 1100 12/28/20 0303  HGB 12.9  --  12.1  --   --  12.9  HCT 38.3  --  36.1  --   --  38.8  PLT 263  --  251  --   --  243  CREATININE 1.04*   < >  --  1.08* 1.33* 1.37*  CKTOTAL  --   --   --   --   --  51   < > = values in this interval not displayed.    Estimated Creatinine Clearance: 90.7 mL/min (A) (by C-G formula based on SCr of 1.37 mg/dL (H)).   Medical History: Past Medical History:  Diagnosis Date  . Cellulitis   . Diabetes mellitus   . PE (pulmonary embolism) ~2007-2008   Not on anticoagulation    Assessment: 45 yo obese F on Xarelto PTA for hx VTE. Currently admitted with infection of L thigh s/p I&D.   Pharmacy consulted to dose Lovenox for VTE px.  CBC WNL. No bleeding noted.  Renal function >39ml/min.  Goal of Therapy:  Monitor platelets by anticoagulation protocol: Yes   Plan:  Lovenox 80mg  sq q24h Monitor for s/sx of bleeding & renal function F/U to resume Xarelto once clinically appropriate  31m PharmD 12/28/2020,6:15 AM

## 2020-12-28 NOTE — TOC Initial Note (Signed)
Transition of Care [facility]) - Initial/Assessment Note    Patient Details  Name: Karen Bennett MRN: [id number] Date of Birth: [birth year]  Transition of Care [facility]) CM/SW Contact:    Golda Acre, RN Phone Number: 12/28/2020, 8:10 AM  Clinical Narrative:                  45 year old female past medical history for type 2 diabetes mellitus, diabetic neuropathy, history of PE/DVT, status post right BKA, hypertension, dyslipidemia, depression, anxiety and obesity class III who presented with left lower extremity pain.  Reported 7 days of erythema at the distal left thigh, that progressed over the last 2 days to blistering lesions, associated with spreading painful erythema.  On her initial physical examination blood pressure 163/56, heart rate 94, respiratory rate 18, oxygen saturation 96% her lungs were clear to auscultation bilaterally, heart S1-S2, present, rhythmic, soft abdomen, left lower extremity with large erythematous lesion, ill-defined borders, with central circular ruptured blebs with dark scars.  Preop diagnosis: soft tissue infection, left thigh Post-op diagnosis/intraop findings: same Procedure performed: incision and excisional debridement left thigh soft tissue infection Specimens: cultures Retained items: kerlix packing EBL: 20cc Complications: none PLAN: possible hhc for dressing chages,recent bka,single and lives in a apartment.   Expected Discharge Plan: Home w Home Health Services Barriers to Discharge: Continued Medical Work up   Patient Goals and CMS Choice Patient states their goals for this hospitalization and ongoing recovery are:: to go home CMS Medicare.gov Compare Post Acute Care list provided to:: Patient Choice offered to / list presented to : Patient  Expected Discharge Plan and Services Expected Discharge Plan: Home w Home Health Services   Discharge Planning Services: CM Consult   Living arrangements for the past 2 months: Apartment                                       Prior Living Arrangements/Services Living arrangements for the past 2 months: Apartment Lives with:: Self   Do you feel safe going back to the place where you live?: Yes            Criminal Activity/Legal Involvement Pertinent to Current Situation/Hospitalization: No - Comment as needed  Activities of Daily Living Home Assistive Devices/Equipment: Electric scooter,CBG Meter ADL Screening (condition at time of admission) Patient's cognitive ability adequate to safely complete daily activities?: Yes Is the patient deaf or have difficulty hearing?: No Does the patient have difficulty seeing, even when wearing glasses/contacts?: No Does the patient have difficulty concentrating, remembering, or making decisions?: No Patient able to express need for assistance with ADLs?: Yes Does the patient have difficulty dressing or bathing?: No Independently performs ADLs?: No Communication: Independent Dressing (OT): Independent Grooming: Independent Feeding: Independent Bathing: Independent Toileting: Independent with device (comment) In/Out Bed: Independent Walks in Home: Independent with device (comment) Does the patient have difficulty walking or climbing stairs?: Yes (secondary to right aka) Weakness of Legs: Left Weakness of Arms/Hands: None  Permission Sought/Granted                  Emotional Assessment Appearance:: Appears stated age Attitude/Demeanor/Rapport: Engaged Affect (typically observed): Calm Orientation: : Oriented to Place,Oriented to Self,Oriented to  Time,Oriented to Situation Alcohol / Substance Use: Not Applicable Psych Involvement: No (comment)  Admission diagnosis:  Necrotizing fasciitis (HCC) [M72.6] Cellulitis [L03.90] Cellulitis of left lower extremity [L03.116] Hyperglycemia due to diabetes mellitus (HCC) [E11.65]  Patient Active Problem List   Diagnosis Date Noted  . BMI 60.0-69.9, adult (HCC) 12/27/2020  . HTN  (hypertension) 12/27/2020  . History of pulmonary embolism 12/27/2020  . Necrotizing fasciitis of lower leg (HCC) Thigh  12/27/2020  . Cellulitis 12/26/2020  . Anxiety and depression 06/26/2013  . Normocytic anemia 05/22/2013  . Depression 05/22/2013  . Grief reaction 05/22/2013  . Acute renal insufficiency 05/07/2013  . Phantom limb pain (HCC) 05/06/2013  . HLD (hyperlipidemia) 05/02/2013  . Diabetic peripheral neuropathy associated with type 2 diabetes mellitus (HCC) 04/30/2013    Class: Chronic  . MSSA (methicillin susceptible Staphylococcus aureus) 09/06/2012  . Foot osteomyelitis, left (HCC) 09/03/2012  . Pulmonary emboli (HCC) 11/11/2011  . Diabetic foot ulcer (HCC) 09/05/2011  . Obesity, Class III, BMI 40-49.9 (morbid obesity) (HCC) 08/15/2011  . DM (diabetes mellitus), type 2, uncontrolled with complications (HCC) 06/12/2007   PCP:  Pcp, No Pharmacy:   [facility] [address] [place] [address] Phone: [phone] Fax: [phone]  [id number] - [place], [place] - [address] [facility] [address] [place] [address] Phone: [phone] Fax: [phone]  [facility] [address] [place] [address] Phone: [phone] Fax: [phone]  [facility] - [address] [place], [place] - [address] [place] [address] Phone: [phone] Fax: [phone]  [id number] - [place], [place] - [address] AT [facility] [address] [place] [address] Phone: [phone] Fax: [phone]     Social Determinants of Health (SDOH) Interventions    Readmission Risk Interventions No flowsheet data found.

## 2020-12-28 NOTE — Consult Note (Signed)
WOC Nurse Consult Note: Patient receiving care in [facility] 1232. 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  InterDry cannot be used in conjunction with any types of creams or powders.  The choice has to be either the antifungal powder or the InterDry, not both.  Therefore, continue the TID antifungal powder. Thank you for the consult.  Discussed plan of care with the patient and bedside nurse.  WOC nurse will not follow at this time.  Please re-consult the WOC team if needed.  Helmut Muster, RN, MSN, CWOCN, CNS-BC, pager [phone]

## 2020-12-29 LAB — BLOOD CULTURE ID PANEL (REFLEXED) - BCID2

## 2020-12-29 LAB — CBC WITH DIFFERENTIAL/PLATELET
Abs Immature Granulocytes: 0.05 10*3/uL (ref 0.00–0.07)
Basophils Absolute: 0 10*3/uL (ref 0.0–0.1)
Basophils Relative: 1 %
Eosinophils Absolute: 0.2 10*3/uL (ref 0.0–0.5)
Eosinophils Relative: 3 %
HCT: 35.5 % — ABNORMAL LOW (ref 36.0–46.0)
Hemoglobin: 11.4 g/dL — ABNORMAL LOW (ref 12.0–15.0)
Immature Granulocytes: 1 %
Lymphocytes Relative: 29 %
Lymphs Abs: 1.7 10*3/uL (ref 0.7–4.0)
MCH: 29.4 pg (ref 26.0–34.0)
MCHC: 32.1 g/dL (ref 30.0–36.0)
MCV: 91.5 fL (ref 80.0–100.0)
Monocytes Absolute: 0.4 10*3/uL (ref 0.1–1.0)
Monocytes Relative: 8 %
Neutro Abs: 3.4 10*3/uL (ref 1.7–7.7)
Neutrophils Relative %: 58 %
Platelets: 285 10*3/uL (ref 150–400)
RBC: 3.88 MIL/uL (ref 3.87–5.11)
RDW: 13.7 % (ref 11.5–15.5)
WBC: 5.7 10*3/uL (ref 4.0–10.5)
nRBC: 0 % (ref 0.0–0.2)

## 2020-12-29 LAB — BASIC METABOLIC PANEL
Anion gap: 7 (ref 5–15)
BUN: 30 mg/dL — ABNORMAL HIGH (ref 6–20)
CO2: 24 mmol/L (ref 22–32)
Calcium: 8.8 mg/dL — ABNORMAL LOW (ref 8.9–10.3)
Chloride: 100 mmol/L (ref 98–111)
Creatinine, Ser: 1.49 mg/dL — ABNORMAL HIGH (ref 0.44–1.00)
GFR, Estimated: 44 mL/min — ABNORMAL LOW (ref >60)
Glucose, Bld: 265 mg/dL — ABNORMAL HIGH (ref 70–99)
Potassium: 3.9 mmol/L (ref 3.5–5.1)
Sodium: 131 mmol/L — ABNORMAL LOW (ref 135–145)

## 2020-12-29 LAB — GLUCOSE, CAPILLARY
Glucose-Capillary: 221 mg/dL — ABNORMAL HIGH (ref 70–99)
Glucose-Capillary: 233 mg/dL — ABNORMAL HIGH (ref 70–99)
Glucose-Capillary: 234 mg/dL — ABNORMAL HIGH (ref 70–99)
Glucose-Capillary: 256 mg/dL — ABNORMAL HIGH (ref 70–99)
Glucose-Capillary: 264 mg/dL — ABNORMAL HIGH (ref 70–99)

## 2020-12-29 MED ORDER — SODIUM CHLORIDE 0.9 % IV SOLN
2.0000 g | INTRAVENOUS | Status: DC
  Filled 2020-12-29: qty 20

## 2020-12-29 MED ORDER — CEFAZOLIN SODIUM-DEXTROSE 2-4 GM/100ML-% IV SOLN
2.0000 g | Freq: Three times a day (TID) | INTRAVENOUS | Status: AC
  Administered 2020-12-29 – 2021-01-03 (×17): 2 g via INTRAVENOUS
  Filled 2020-12-29 (×17): qty 100

## 2020-12-29 MED ORDER — INSULIN GLARGINE 100 UNIT/ML ~~LOC~~ SOLN
35.0000 [IU] | Freq: Two times a day (BID) | SUBCUTANEOUS | Status: DC
  Administered 2020-12-29 (×2): 35 [IU] via SUBCUTANEOUS
  Filled 2020-12-29 (×3): qty 0.35

## 2020-12-29 MED ORDER — FLUCONAZOLE IN SODIUM CHLORIDE 400-0.9 MG/200ML-% IV SOLN
400.0000 mg | INTRAVENOUS | Status: DC
  Administered 2020-12-30 – 2021-01-03 (×5): 400 mg via INTRAVENOUS
  Filled 2020-12-29 (×5): qty 200

## 2020-12-29 MED ORDER — FLUCONAZOLE IN SODIUM CHLORIDE 400-0.9 MG/200ML-% IV SOLN
400.0000 mg | INTRAVENOUS | Status: AC
  Administered 2020-12-29 (×2): 400 mg via INTRAVENOUS
  Filled 2020-12-29 (×2): qty 200

## 2020-12-29 NOTE — Progress Notes (Signed)
Unable to assess coccyx area. Patient refused roll over

## 2020-12-29 NOTE — Progress Notes (Addendum)
PROGRESS NOTE    Karen Bennett  ESP:[id number] DOB: 1976-06-20 DOA: 12/26/2020 PCP: Pcp, No    Brief Narrative:  Morbidly obese 45 year old female history of type 2 diabetes mellitus, prior history of DVT/PE, right BKA, super morbid obesity, BMI of 68 presented to the ED with left lower extremity pain rash and blistering .  -Noted to have necrotizing fasciitis this was confirmed on imaging  -Started on broad-spectrum antibiotics -Surgery was consulted and underwent incision and drainage, debridement left thigh soft tissue infection on 12/27/20.    Assessment & Plan:   Left lateral thigh necrotizing fascitis -Sp I&D 6/6 by Dr. Doylene Canard -Wound culture with Klebsiella, follow-up sensitivities -Currently on IV Zosyn day 3  -TOC consult, may need short-term rehab for wound care  Candida albicans fungemia -Source could have been above, in the background of uncontrolled diabetes -IV fluconazole day 1 -Will request infectious disease input  Uncontrolled hyperglycemia, T2DM. Morbid obesity, BMI is 65 -Hemoglobin A1c is 15.5 -CBGs poorly controlled, increase Lantus and Premeal NovoLog -Needs to lose tremendous amount of weight -uses a motorized wheelchair  Right BKA  Hypertension -Stable, lisinopril on hold  Stage II right buttock pressure ulcer present on admission  Hx of DVT and PE.  -She had not been taking rivaroxaban as outpatient, this was resumed by my partner on admission  -Remains at high risk for VTE  AKI, hyponatremia.  -Likely secondary to fluid shifts, sodium is improving, continue to monitor -Lisinopril on hold  DVT prophylaxis: rivaroxaban   Code Status:   full  Family Communication:  No family at the bedside    Remains inpatient appropriate because:Inpatient level of care appropriate due to severity of illness   Dispo: The patient is from: Home              Anticipated d/c is to: Home              Patient currently is not medically stable to d/c.    Difficult to place patient No      Skin Documentation: Pressure Injury 12/27/20 Buttocks Right;Left Stage 2 -  Partial thickness loss of dermis presenting as a shallow open injury with a red, pink wound bed without slough. (Active)  12/27/20 1200  Location: Buttocks  Location Orientation: Right;Left  Staging: Stage 2 -  Partial thickness loss of dermis presenting as a shallow open injury with a red, pink wound bed without slough.  Wound Description (Comments):   Present on Admission:      Consultants:   Surgery   Procedures:   I&D left thigh   Antimicrobials:   Daptomycin (dc)  Zosyn     Subjective: -Feels better, pain is better controlled, denies any dyspnea -Do not think she will be able to manage her wound care at home Objective: Vitals:   12/28/20 1656 12/28/20 2012 12/29/20 0028 12/29/20 0438  BP:  121/69 129/67 140/67  Pulse:  68 72 82  Resp:  17 18 18   Temp: 97.6 F (36.4 C)  97.7 F (36.5 C)   TempSrc: Oral     SpO2:  91% 96% 94%  Weight:      Height:        Intake/Output Summary (Last 24 hours) at 12/29/2020 1144 Last data filed at 12/29/2020 1000 Gross per 24 hour  Intake 1177.59 ml  Output 1600 ml  Net -422.41 ml   Filed Weights   12/26/20 1843 12/27/20 0716  Weight: (!) 182.3 kg (!) 182.3 kg    Examination:  General: Morbidly obese female chronically ill-appearing laying in bed, awake alert oriented x3, no distress HEENT: Mild pallor, no icterus, unable to assess JVD CVS: S1-S2, regular rate rhythm Lungs: Distant breath sounds Abdomen: Soft, obese, nontender, bowel sounds present Extremities, right BKA, left lateral thigh wound with dressing today, prior amputation of 2 lateral toes Skin: As above   Data Reviewed: I have personally reviewed following labs and imaging studies  CBC: Recent Labs  Lab 12/26/20 2035 12/27/20 0315 12/28/20 0303 12/29/20 0451  WBC 6.9 6.1 7.5 5.7  NEUTROABS 4.8 3.9 5.5 3.4  HGB 12.9 12.1 12.9 11.4*   HCT 38.3 36.1 38.8 35.5*  MCV 86.7 85.7 86.8 91.5  PLT 263 251 243 285   Basic Metabolic Panel: Recent Labs  Lab 12/27/20 0306 12/27/20 0316 12/27/20 1100 12/28/20 0303 12/29/20 0451  NA 126* 127* 127* 127* 131*  K 3.6 4.1 3.7 4.6 3.9  CL 95* 98 95* 97* 100  CO2 23 18* 24 19* 24  GLUCOSE 447* 165* 209* 423* 265*  BUN 19 19 21* 25* 30*  CREATININE 1.00 1.08* 1.33* 1.37* 1.49*  CALCIUM 8.4* 8.5* 8.5* 8.4* 8.8*   GFR: Estimated Creatinine Clearance: 83.4 mL/min (A) (by C-G formula based on SCr of 1.49 mg/dL (H)). Liver Function Tests: Recent Labs  Lab 12/26/20 2035  AST 8*  ALT 10  ALKPHOS 77  BILITOT 0.6  PROT 7.2  ALBUMIN 2.2*   No results for input(s): LIPASE, AMYLASE in the last 168 hours. No results for input(s): AMMONIA in the last 168 hours. Coagulation Profile: No results for input(s): INR, PROTIME in the last 168 hours. Cardiac Enzymes: Recent Labs  Lab 12/28/20 0303  CKTOTAL 51   BNP (last 3 results) No results for input(s): PROBNP in the last 8760 hours. HbA1C: Recent Labs    12/27/20 1459  HGBA1C 15.5*   CBG: Recent Labs  Lab 12/28/20 0739 12/28/20 1134 12/28/20 1654 12/29/20 0000 12/29/20 0740  GLUCAP 385* 370* 301* 264* 221*   Lipid Profile: No results for input(s): CHOL, HDL, LDLCALC, TRIG, CHOLHDL, LDLDIRECT in the last 72 hours. Thyroid Function Tests: No results for input(s): TSH, T4TOTAL, FREET4, T3FREE, THYROIDAB in the last 72 hours. Anemia Panel: No results for input(s): VITAMINB12, FOLATE, FERRITIN, TIBC, IRON, RETICCTPCT in the last 72 hours.    Radiology Studies: I have reviewed all of the imaging during this hospital visit personally     Scheduled Meds: . acetaminophen  650 mg Oral Q6H  . atorvastatin  40 mg Oral q1800  . buPROPion  300 mg Oral Daily  . Chlorhexidine Gluconate Cloth  6 each Topical Daily  . insulin aspart  0-15 Units Subcutaneous TID WC  . insulin aspart  6 Units Subcutaneous TID WC  .  insulin glargine  35 Units Subcutaneous BID  . nystatin   Topical TID  . pantoprazole  40 mg Oral Daily  . rivaroxaban  20 mg Oral Q supper  . traZODone  25 mg Oral QHS  . venlafaxine XR  75 mg Oral Daily   Continuous Infusions: . [START ON 12/30/2020] fluconazole (DIFLUCAN) IV    . piperacillin-tazobactam (ZOSYN)  IV 3.375 g (12/29/20 1058)     LOS: 2 days    Zannie Cove, MD

## 2020-12-29 NOTE — Consult Note (Signed)
[facility]  Total days of antibiotics 4/piptazo               Reason for Consult: candidemia    Referring Physician: preetha  Principal Problem:   Necrotizing fasciitis of lower leg (HCC) Thigh  Active Problems:   DM (diabetes mellitus), type 2, uncontrolled with complications (HCC)   Anxiety and depression   Cellulitis   BMI 60.0-69.9, adult (HCC)   HTN (hypertension)   History of pulmonary embolism   Pressure injury of skin   HPI: Karen Bennett is a 45 y.o. female with history of DM, hx of cellulitis, PE, right BKA, obesity, who was admitted for new onset right thigh swelling, erythema found to have deep tissue abscess with sepsis physiology. She underwent I x D on 6/6. Prior to admit, her left thigh had quickly changed from small indurated region to spreading erythema and larger region of induration with associated chills. She denies trauma or scratches. No pets.  She last took abtx in April when she was treated for cellulitis to right thigh. Not had similar abscess requiring I x D before other than  She has hx of sepsis for flu requiring vasopressors, ischemic right limb - requiring BKA. 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 Cellulitis   . Diabetes mellitus   . PE (pulmonary embolism) ~2007-2008   Not on anticoagulation    Allergies:  Allergies  Allergen Reactions  . Clindamycin Nausea And Vomiting and Nausea Only  . Zofran Itching and Nausea And Vomiting  . Doxycycline Nausea And Vomiting  . Morphine And Related Itching    MEDICATIONS: . acetaminophen  650 mg Oral Q6H  . buPROPion  300 mg Oral Daily  . Chlorhexidine Gluconate Cloth  6 each Topical Daily  . insulin aspart  0-15 Units Subcutaneous TID WC  . insulin aspart  6 Units Subcutaneous TID WC  . insulin glargine  35 Units Subcutaneous BID  . nystatin    Topical TID  . pantoprazole  40 mg Oral Daily  . rivaroxaban  20 mg Oral Q supper  . traZODone  25 mg Oral QHS  . venlafaxine XR  75 mg Oral Daily    Social History   Tobacco Use  . Smoking status: Never Smoker  . Smokeless tobacco: Never Used  Substance Use Topics  . Alcohol use: No  . Drug use: No    Family History  Problem Relation Age of Onset  . Diabetes Mother   . Coronary artery disease Mother   . Stroke Mother      Review of Systems  Constitutional: Negative for fever, chills, diaphoresis, activity change, appetite change, fatigue and unexpected weight change.  HENT: Negative for congestion, sore throat, rhinorrhea, sneezing, trouble swallowing and sinus pressure.  Eyes: Negative for photophobia and visual disturbance.  Respiratory: Negative for cough, chest tightness, shortness of breath, wheezing and stridor.  Cardiovascular: Negative for chest pain, palpitations and leg swelling.  Gastrointestinal: Negative for nausea, vomiting, abdominal pain, diarrhea, constipation, blood in stool, abdominal distention and anal bleeding.  Genitourinary: Negative for dysuria, hematuria, flank pain and difficulty urinating.  Musculoskeletal: Negative for myalgias, back pain, joint swelling, arthralgias and gait problem.  Skin: +left thigh abscess Neurological: Negative for dizziness, tremors, weakness and light-headedness.  Hematological: Negative for adenopathy. Does not bruise/bleed easily.  Psychiatric/Behavioral: Negative  for behavioral problems, confusion, sleep disturbance, dysphoric mood, decreased concentration and agitation.   OBJECTIVE: Temp:  [97.7 F (36.5 C)-98.2 F (36.8 C)] 98.2 F (36.8 C) (06/08 1348) Pulse Rate:  [68-82] 78 (06/08 1348) Resp:  [17-18] 18 (06/08 1348) BP: (121-140)/(67-69) 138/67 (06/08 1348) SpO2:  [91 %-96 %] 94 % (06/08 1348) Physical Exam  Constitutional:  oriented to person, place, and time. appears well-developed and well-nourished.  No distress.  HENT: [place]/AT, PERRLA, no scleral icterus Mouth/Throat: Oropharynx is clear and moist. No oropharyngeal exudate.  Cardiovascular: Normal rate, regular rhythm and normal heart sounds. Exam reveals no gallop and no friction rub.  No murmur heard.  Pulmonary/Chest: Effort normal and breath sounds normal. No respiratory distress.  has no wheezes.  Neck = supple, no nuchal rigidity Abdominal: Soft. Bowel sounds are normal.  exhibits no distension. There is no tenderness.  Lymphadenopathy: no cervical adenopathy. No axillary adenopathy Neurological: alert and oriented to person, place, and time.  Skin: left thigh-- induration and mild blanching with packing in place Psychiatric: a normal mood and affect.  behavior is normal.    LABS: Results for orders placed or performed during the hospital encounter of 12/26/20 (from the past 48 hour(s))  Glucose, capillary     Status: Abnormal   Collection Time: 12/27/20  9:27 PM  Result Value Ref Range   Glucose-Capillary 387 (H) 70 - 99 mg/dL    Comment: Glucose reference range applies only to samples taken after fasting for at least 8 hours.   Comment 1 Notify RN    Comment 2 Document in Chart   CK     Status: None   Collection Time: 12/28/20  3:03 AM  Result Value Ref Range   Total CK 51 38 - 234 U/L    Comment: Performed at [facility] [address] [place], [place] [address]  Basic metabolic panel     Status: Abnormal   Collection Time: 12/28/20  3:03 AM  Result Value Ref Range   Sodium 127 (L) 135 - 145 mmol/L   Potassium 4.6 3.5 - 5.1 mmol/L    Comment: NO VISIBLE HEMOLYSIS DELTA CHECK NOTED    Chloride 97 (L) 98 - 111 mmol/L   CO2 19 (L) 22 - 32 mmol/L   Glucose, Bld 423 (H) 70 - 99 mg/dL    Comment: Glucose reference range applies only to samples taken after fasting for at least 8 hours.   BUN 25 (H) 6 - 20 mg/dL   Creatinine, Ser 7.82 (H) 0.44 - 1.00 mg/dL   Calcium 8.4 (L) 8.9 - 10.3 mg/dL   GFR,  Estimated 49 (L) >60 mL/min    Comment: (NOTE) Calculated using the CKD-EPI Creatinine Equation (2021)    Anion gap 11 5 - 15    Comment: Performed at [facility], [address], [place], [place] [address]  CBC with Differential/Platelet     Status: Abnormal   Collection Time: 12/28/20  3:03 AM  Result Value Ref Range   WBC 7.5 4.0 - 10.5 K/uL   RBC 4.47 3.87 - 5.11 MIL/uL   Hemoglobin 12.9 12.0 - 15.0 g/dL   HCT 61.4 43.1 - 54.0 %   MCV 86.8 80.0 - 100.0 fL   MCH 28.9 26.0 - 34.0 pg   MCHC 33.2 30.0 - 36.0 g/dL   RDW 08.6 76.1 - 95.0 %   Platelets 243 150 - 400 K/uL   nRBC 0.3 (H) 0.0 - 0.2 %  Neutrophils Relative % 74 %   Neutro Abs 5.5 1.7 - 7.7 K/uL   Lymphocytes Relative 16 %   Lymphs Abs 1.2 0.7 - 4.0 K/uL   Monocytes Relative 9 %   Monocytes Absolute 0.6 0.1 - 1.0 K/uL   Eosinophils Relative 0 %   Eosinophils Absolute 0.0 0.0 - 0.5 K/uL   Basophils Relative 0 %   Basophils Absolute 0.0 0.0 - 0.1 K/uL   Immature Granulocytes 1 %   Abs Immature Granulocytes 0.09 (H) 0.00 - 0.07 K/uL    Comment: Performed at [facility], [address], [place], [place] [address]  Glucose, capillary     Status: Abnormal   Collection Time: 12/28/20  7:39 AM  Result Value Ref Range   Glucose-Capillary 385 (H) 70 - 99 mg/dL    Comment: Glucose reference range applies only to samples taken after fasting for at least 8 hours.   Comment 1 Notify RN    Comment 2 Document in Chart   Glucose, capillary     Status: Abnormal   Collection Time: 12/28/20 11:34 AM  Result Value Ref Range   Glucose-Capillary 370 (H) 70 - 99 mg/dL    Comment: Glucose reference range applies only to samples taken after fasting for at least 8 hours.   Comment 1 Notify RN    Comment 2 Document in Chart   Glucose, capillary     Status: Abnormal   Collection Time: 12/28/20  4:54 PM  Result Value Ref Range   Glucose-Capillary 301 (H) 70 - 99 mg/dL    Comment: Glucose reference  range applies only to samples taken after fasting for at least 8 hours.   Comment 1 Notify RN    Comment 2 Document in Chart   Glucose, capillary     Status: Abnormal   Collection Time: 12/29/20 12:00 AM  Result Value Ref Range   Glucose-Capillary 264 (H) 70 - 99 mg/dL    Comment: Glucose reference range applies only to samples taken after fasting for at least 8 hours.  Basic metabolic panel     Status: Abnormal   Collection Time: 12/29/20  4:51 AM  Result Value Ref Range   Sodium 131 (L) 135 - 145 mmol/L   Potassium 3.9 3.5 - 5.1 mmol/L   Chloride 100 98 - 111 mmol/L   CO2 24 22 - 32 mmol/L   Glucose, Bld 265 (H) 70 - 99 mg/dL    Comment: Glucose reference range applies only to samples taken after fasting for at least 8 hours.   BUN 30 (H) 6 - 20 mg/dL   Creatinine, Ser 1.77 (H) 0.44 - 1.00 mg/dL   Calcium 8.8 (L) 8.9 - 10.3 mg/dL   GFR, Estimated 44 (L) >60 mL/min    Comment: (NOTE) Calculated using the CKD-EPI Creatinine Equation (2021)    Anion gap 7 5 - 15    Comment: Performed at [facility], [address], [place], [place] [address]  CBC with Differential/Platelet     Status: Abnormal   Collection Time: 12/29/20  4:51 AM  Result Value Ref Range   WBC 5.7 4.0 - 10.5 K/uL   RBC 3.88 3.87 - 5.11 MIL/uL   Hemoglobin 11.4 (L) 12.0 - 15.0 g/dL   HCT 00.9 (L) 23.3 - 00.7 %   MCV 91.5 80.0 - 100.0 fL   MCH 29.4 26.0 - 34.0 pg   MCHC 32.1 30.0 - 36.0 g/dL   RDW 62.2 63.3 - 35.4 %  Platelets 285 150 - 400 K/uL   nRBC 0.0 0.0 - 0.2 %   Neutrophils Relative % 58 %   Neutro Abs 3.4 1.7 - 7.7 K/uL   Lymphocytes Relative 29 %   Lymphs Abs 1.7 0.7 - 4.0 K/uL   Monocytes Relative 8 %   Monocytes Absolute 0.4 0.1 - 1.0 K/uL   Eosinophils Relative 3 %   Eosinophils Absolute 0.2 0.0 - 0.5 K/uL   Basophils Relative 1 %   Basophils Absolute 0.0 0.0 - 0.1 K/uL   Immature Granulocytes 1 %   Abs Immature Granulocytes 0.05 0.00 - 0.07 K/uL    Comment: Performed  at [facility], [address] [place], [place] [address]  Glucose, capillary     Status: Abnormal   Collection Time: 12/29/20  7:40 AM  Result Value Ref Range   Glucose-Capillary 221 (H) 70 - 99 mg/dL    Comment: Glucose reference range applies only to samples taken after fasting for at least 8 hours.  Glucose, capillary     Status: Abnormal   Collection Time: 12/29/20 12:00 PM  Result Value Ref Range   Glucose-Capillary 256 (H) 70 - 99 mg/dL    Comment: Glucose reference range applies only to samples taken after fasting for at least 8 hours.  Glucose, capillary     Status: Abnormal   Collection Time: 12/29/20  4:23 PM  Result Value Ref Range   Glucose-Capillary 234 (H) 70 - 99 mg/dL    Comment: Glucose reference range applies only to samples taken after fasting for at least 8 hours.    MICRO: Organism ID, Bacteria KLEBSIELLA PNEUMONIAE   Resulting Agency [facility]      Susceptibility   Klebsiella pneumoniae    MIC    AMPICILLIN >=32 RESIST... Resistant    AMPICILLIN/SULBACTAM 4 SENSITIVE  Sensitive    CEFAZOLIN <=4 SENSITIVE  Sensitive    CEFEPIME <=0.12 SENS... Sensitive    CEFTAZIDIME <=1 SENSITIVE  Sensitive    CEFTRIAXONE <=0.25 SENS... Sensitive    CIPROFLOXACIN <=0.25 SENS... Sensitive    GENTAMICIN <=1 SENSITIVE  Sensitive    IMIPENEM <=0.25 SENS... Sensitive    PIP/TAZO <=4 SENSITIVE  Sensitive    TRIMETH/SULFA <=20 SENSIT... Sensitive           IMAGING: No results found.   Assessment/Plan:  44yo with left thigh deep tissue infection (klebsiella) with candidemia. Unclear with source of candidemia since one would think it would also be isolated from wound culture.  Left thigh abscess s/p debridement = will change abtx cefazolin. Continue with TID wet to dry dressing changes  Candidemia = continue fluconazole. Will need TEE to rule out endocarditis. Please repeat blood cx tomorrow  Cutaneous candidal infection = continue with  topical antifungal cream.

## 2020-12-29 NOTE — Progress Notes (Signed)
PHARMACY - PHYSICIAN COMMUNICATION CRITICAL VALUE ALERT - BLOOD CULTURE IDENTIFICATION (BCID)  Karen Bennett is an 45 y.o. female who presented to [facility] on 12/26/2020 with a chief complaint of worsening infection on left thigh.  Assessment: Noted to have necrotizing fasciitis being followed by surgery for I&D- antibiotic regimen narrowed to Zosyn for klebsiella growing in wound cx. VSS, WBC WNL.  Aerobic bottle of 1 blood cx set now + yeast (BCID+C. Albicans)  Name of physician (or Provider) Contacted: Asia Zierle-Ghosh  Current antibiotics: Zosyn 3.375gm IV Q8h to be infused over 4hrs  Changes to prescribed antibiotics recommended:  Add Diflucan 800mg  IV x1 then 400mg  q24h F/U ID recommendations in am (will receive auto-alert for yeast in blood) *Consider hold Lipitor while on Diflucan    Results for orders placed or performed during the hospital encounter of 12/26/20  Blood Culture ID Panel (Reflexed) (Collected: 12/26/2020  6:56 PM)  Result Value Ref Range   Enterococcus faecalis NOT DETECTED NOT DETECTED   Enterococcus Faecium NOT DETECTED NOT DETECTED   Listeria monocytogenes NOT DETECTED NOT DETECTED   Staphylococcus species NOT DETECTED NOT DETECTED   Staphylococcus aureus (BCID) NOT DETECTED NOT DETECTED   Staphylococcus epidermidis NOT DETECTED NOT DETECTED   Staphylococcus lugdunensis NOT DETECTED NOT DETECTED   Streptococcus species NOT DETECTED NOT DETECTED   Streptococcus agalactiae NOT DETECTED NOT DETECTED   Streptococcus pneumoniae NOT DETECTED NOT DETECTED   Streptococcus pyogenes NOT DETECTED NOT DETECTED   A.calcoaceticus-baumannii NOT DETECTED NOT DETECTED   Bacteroides fragilis NOT DETECTED NOT DETECTED   Enterobacterales NOT DETECTED NOT DETECTED   Enterobacter cloacae complex NOT DETECTED NOT DETECTED   Escherichia coli NOT DETECTED NOT DETECTED   Klebsiella aerogenes NOT DETECTED NOT DETECTED   Klebsiella oxytoca NOT DETECTED NOT DETECTED    Klebsiella pneumoniae NOT DETECTED NOT DETECTED   Proteus species NOT DETECTED NOT DETECTED   Salmonella species NOT DETECTED NOT DETECTED   Serratia marcescens NOT DETECTED NOT DETECTED   Haemophilus influenzae NOT DETECTED NOT DETECTED   Neisseria meningitidis NOT DETECTED NOT DETECTED   Pseudomonas aeruginosa NOT DETECTED NOT DETECTED   Stenotrophomonas maltophilia NOT DETECTED NOT DETECTED   Candida albicans DETECTED (A) NOT DETECTED   Candida auris NOT DETECTED NOT DETECTED   Candida glabrata NOT DETECTED NOT DETECTED   Candida krusei NOT DETECTED NOT DETECTED   Candida parapsilosis NOT DETECTED NOT DETECTED   Candida tropicalis NOT DETECTED NOT DETECTED   Cryptococcus neoformans/gattii NOT DETECTED NOT DETECTED    02/25/21 PharmD 12/29/2020  3:03 AM

## 2020-12-29 NOTE — Evaluation (Signed)
Physical Therapy Evaluation Patient Details Name: Karen Bennett MRN: [id number] DOB: 11/18/1975 Today's Date: 12/29/2020   History of Present Illness  45 year old female  who presented with left lower extremity pain.  Reported 7 days of erythema at the distal left thigh, that progressed to blistering lesions, associated with spreading painful erythema. Dx of necrotizing fasciitis, s/p I&D 12/27/2020. Past medical history:  type 2 diabetes mellitus, diabetic neuropathy, history of PE/DVT, status post right BKA 2019, hypertension, dyslipidemia, depression, anxiety and obesity class III  Clinical Impression  Pt admitted with above diagnosis. Pt is able to roll independently using bedrails for linen change and pericare, she was saturated in urine at start of PT session. Pt reported she was fatigued and in pain after rolling L and R. Will plan to attempt transfers next session, pt will need a bariatric recliner, will order one from portable. At baseline she is independent with anterior/posterior transfers into her motorized WC.  Pt currently with functional limitations due to the deficits listed below (see PT Problem List). Pt will benefit from skilled PT to increase their independence and safety with mobility to allow discharge to the venue listed below.       Follow Up Recommendations Home health PT    Equipment Recommendations  None recommended by PT    Recommendations for Other Services       Precautions / Restrictions Precautions Precautions: Fall Precaution Comments: no falls in past 1 year Restrictions Weight Bearing Restrictions: No      Mobility  Bed Mobility Overal bed mobility: Needs Assistance Bed Mobility: Rolling Rolling: Modified independent (Device/Increase time)         General bed mobility comments: rolling L and R for linen change and pericare, used bed rail, no assist needed    Transfers                 General transfer comment: deferred, will need  bariatric recliner, will request one from portable  Ambulation/Gait                Stairs            Wheelchair Mobility    Modified Rankin (Stroke Patients Only)       Balance                                             Pertinent Vitals/Pain Pain Assessment: 0-10 Pain Score: 8  Pain Location: L thigh Pain Descriptors / Indicators: Grimacing;Sore Pain Intervention(s): Limited activity within patient's tolerance;Monitored during session;Premedicated before session;Repositioned    Home Living Family/patient expects to be discharged to:: Private residence Living Arrangements: Alone Available Help at Discharge: Personal care attendant Type of Home: Apartment Home Access: Level entry     Home Layout: One level Home Equipment: Wheelchair - power;Shower seat Additional Comments: aide comes 7x/week for 5 hrs, helps with cleaning and laundry    Prior Function Level of Independence: Needs assistance   Gait / Transfers Assistance Needed: independent anterior/posterior transfers; non ambulatory for a couple years (shattered femur then, after amputation)  ADL's / Homemaking Assistance Needed: pt reports she can transfer into a shower using shower seat, has roll in shower and transfers onto seat  Comments: pt reported she performs anterior/posterior scoots from bed into/out of motorized WC, she does not stand, she does not have a prosthesis for RLE (amputation was 2019)  Hand Dominance        Extremity/Trunk Assessment   Upper Extremity Assessment Upper Extremity Assessment: Overall WFL for tasks assessed    Lower Extremity Assessment Lower Extremity Assessment: LLE deficits/detail;RLE deficits/detail RLE Deficits / Details: R BKA, able to perform SLR LLE: Unable to fully assess due to pain       Communication   Communication: No difficulties  Cognition Arousal/Alertness: Awake/alert Behavior During Therapy: WFL for tasks  assessed/performed Overall Cognitive Status: Within Functional Limits for tasks assessed                                        General Comments      Exercises     Assessment/Plan    PT Assessment Patient needs continued PT services  PT Problem List Decreased strength;Decreased activity tolerance;Pain       PT Treatment Interventions Therapeutic exercise;Therapeutic activities;Functional mobility training    PT Goals (Current goals can be found in the Care Plan section)  Acute Rehab PT Goals Patient Stated Goal: likes to cook, color, garden PT Goal Formulation: With patient Time For Goal Achievement: 01/12/21 Potential to Achieve Goals: Fair    Frequency Min 3X/week   Barriers to discharge        Co-evaluation               AM-PAC PT "6 Clicks" Mobility  Outcome Measure Help needed turning from your back to your side while in a flat bed without using bedrails?: A Little Help needed moving from lying on your back to sitting on the side of a flat bed without using bedrails?: A Lot Help needed moving to and from a bed to a chair (including a wheelchair)?: A Lot Help needed standing up from a chair using your arms (e.g., wheelchair or bedside chair)?: Total Help needed to walk in hospital room?: Total Help needed climbing 3-5 steps with a railing? : Total 6 Click Score: 10    End of Session   Activity Tolerance: Patient limited by pain;Patient limited by fatigue Patient left: in bed;with call bell/phone within reach Nurse Communication: Mobility status PT Visit Diagnosis: Pain;Other abnormalities of gait and mobility (R26.89) Pain - Right/Left: Left Pain - part of body: Leg    Time: 8756-4332 PT Time Calculation (min) (ACUTE ONLY): 29 min   Charges:   PT Evaluation $PT Eval Moderate Complexity: 1 Mod PT Treatments $Therapeutic Activity: 8-22 mins        Ralene Bathe Kistler PT 12/29/2020  Acute Rehabilitation Services Pager  [phone] Office [phone]

## 2020-12-29 NOTE — Progress Notes (Signed)
2 Days Post-Op  Subjective: No acute change  ROS: See above, otherwise other systems negative  Objective: Vital signs in last 24 hours: Temp:  [97.6 F (36.4 C)-97.7 F (36.5 C)] 97.7 F (36.5 C) (06/08 0028) Pulse Rate:  [68-87] 82 (06/08 0438) Resp:  [17-32] 18 (06/08 0438) BP: (121-159)/(44-79) 140/67 (06/08 0438) SpO2:  [91 %-96 %] 94 % (06/08 0438) Last BM Date: 12/27/20  Intake/Output from previous day: 06/07 0701 - 06/08 0700 In: 817.6 [I.V.:741.3; IV Piggyback:76.3] Out: 1600 [Urine:1600] Intake/Output this shift: No intake/output data recorded.  PE: Ext: LLE wound with decrease in erythema today, still with tenderness and some surrounding induration anteromedially.  Wound is overall clean with no purulent drainage or further necrotic tissue, black in wound is secondary to cautery.    Lab Results:  Recent Labs    12/27/20 0315 12/28/20 0303  WBC 6.1 7.5  HGB 12.1 12.9  HCT 36.1 38.8  PLT 251 243   BMET Recent Labs    12/28/20 0303 12/29/20 0451  NA 127* 131*  K 4.6 3.9  CL 97* 100  CO2 19* 24  GLUCOSE 423* 265*  BUN 25* 30*  CREATININE 1.37* 1.49*  CALCIUM 8.4* 8.8*   PT/INR No results for input(s): LABPROT, INR in the last 72 hours. CMP     Component Value Date/Time   NA 131 (L) 12/29/2020 0451   K 3.9 12/29/2020 0451   CL 100 12/29/2020 0451   CO2 24 12/29/2020 0451   GLUCOSE 265 (H) 12/29/2020 0451   BUN 30 (H) 12/29/2020 0451   CREATININE 1.49 (H) 12/29/2020 0451   CREATININE 0.70 09/01/2013 0954   CALCIUM 8.8 (L) 12/29/2020 0451   PROT 7.2 12/26/2020 2035   ALBUMIN 2.2 (L) 12/26/2020 2035   AST 8 (L) 12/26/2020 2035   ALT 10 12/26/2020 2035   ALKPHOS 77 12/26/2020 2035   BILITOT 0.6 12/26/2020 2035   GFRNONAA 44 (L) 12/29/2020 0451   GFRNONAA >89 09/01/2013 0954   GFRAA >89 09/01/2013 0954   Lipase     Component Value Date/Time   LIPASE 20 09/05/2011 0120       Studies/Results: No results  found.  Anti-infectives: Anti-infectives (From admission, onward)   Start     Dose/Rate Route Frequency Ordered Stop   12/30/20 0400  fluconazole (DIFLUCAN) IVPB 400 mg        400 mg 100 mL/hr over 120 Minutes Intravenous Every 24 hours 12/29/20 0314     12/29/20 0400  fluconazole (DIFLUCAN) IVPB 400 mg        400 mg 100 mL/hr over 120 Minutes Intravenous Every 2 hours 12/29/20 0314 12/29/20 0754   12/27/20 2000  DAPTOmycin (CUBICIN) 650 mg in sodium chloride 0.9 % IVPB  Status:  Discontinued        6 mg/kg  108.5 kg (Adjusted) 126 mL/hr over 30 Minutes Intravenous Daily 12/27/20 1229 12/27/20 1454   12/27/20 1000  vancomycin (VANCOREADY) IVPB 2000 mg/400 mL  Status:  Discontinued        2,000 mg 200 mL/hr over 120 Minutes Intravenous Every 12 hours 12/27/20 0049 12/27/20 1218   12/27/20 0923  vancomycin (VANCOCIN) 1-5 GM/200ML-% IVPB       Note to Pharmacy: Ramond Craver   : cabinet override      12/27/20 0923 12/27/20 2129   12/27/20 0923  piperacillin-tazobactam (ZOSYN) 3.375 GM/50ML IVPB       Note to Pharmacy: Ramond Craver   : cabinet override  12/27/20 0923 12/27/20 0928   12/27/20 0200  piperacillin-tazobactam (ZOSYN) IVPB 3.375 g        3.375 g 12.5 mL/hr over 240 Minutes Intravenous Every 8 hours 12/27/20 0049     12/26/20 2000  vancomycin (VANCOREADY) IVPB 2000 mg/400 mL        2,000 mg 200 mL/hr over 120 Minutes Intravenous Once 12/26/20 1954 12/27/20 0136   12/26/20 1945  piperacillin-tazobactam (ZOSYN) IVPB 3.375 g        3.375 g 100 mL/hr over 30 Minutes Intravenous  Once 12/26/20 1930 12/27/20 0136   12/26/20 1945  vancomycin (VANCOCIN) 2,000 mg in sodium chloride 0.9 % 500 mL IVPB  Status:  Discontinued        2,000 mg 250 mL/hr over 120 Minutes Intravenous  Once 12/26/20 1931 12/26/20 1953       Assessment/Plan POD 2 s/p I&D of L thigh abscess, Dr. Fredricka Bonine -NS WD dressing changes BID to thigh wound -cx pending, G - rods  -mobilize as able, has  BKA on R side -cont abx therapy -continue efforts for glycemic control- she has continued to have consistent BG>200   FEN - carb mod diet VTE - Lovenox ID - zosyn  Morbid obesity DM HTN D/O DVT/PE AKI   LOS: 2 days    Berna Bue , MD [facility] Surgery 12/29/2020, 8:42 AM Please see Amion for pager number during day hours 7:00am-4:30pm or 7:00am -11:30am on weekends

## 2020-12-29 NOTE — Progress Notes (Signed)
PT Cancellation Note  Patient Details Name: Karen Bennett MRN: [id number] DOB: 1975-11-28   Cancelled Treatment:    Reason Eval/Treat Not Completed: Fatigue/lethargy limiting ability to participate;Pain limiting ability to participate (pt declined PT at present, stated she is in pain and needs to sleep right now. Notified RN of pain medication request. Will check back later today. Pt reported she uses a motorized scooter at baseline, she does not have a prosthesis for RLE.)   Tamala Ser PT 12/29/2020  Acute Rehabilitation Services Pager [phone] Office [phone]

## 2020-12-30 LAB — CBC
HCT: 37.7 % (ref 36.0–46.0)
Hemoglobin: 11.9 g/dL — ABNORMAL LOW (ref 12.0–15.0)
MCH: 28.8 pg (ref 26.0–34.0)
MCHC: 31.6 g/dL (ref 30.0–36.0)
MCV: 91.3 fL (ref 80.0–100.0)
Platelets: 303 10*3/uL (ref 150–400)
RBC: 4.13 MIL/uL (ref 3.87–5.11)
RDW: 13.6 % (ref 11.5–15.5)
WBC: 5.5 10*3/uL (ref 4.0–10.5)
nRBC: 0 % (ref 0.0–0.2)

## 2020-12-30 LAB — GLUCOSE, CAPILLARY
Glucose-Capillary: 203 mg/dL — ABNORMAL HIGH (ref 70–99)
Glucose-Capillary: 240 mg/dL — ABNORMAL HIGH (ref 70–99)
Glucose-Capillary: 152 mg/dL — ABNORMAL HIGH (ref 70–99)
Glucose-Capillary: 205 mg/dL — ABNORMAL HIGH (ref 70–99)

## 2020-12-30 LAB — COMPREHENSIVE METABOLIC PANEL
ALT: 10 U/L (ref 0–44)
AST: 9 U/L — ABNORMAL LOW (ref 15–41)
Albumin: 2.3 g/dL — ABNORMAL LOW (ref 3.5–5.0)
Alkaline Phosphatase: 58 U/L (ref 38–126)
Anion gap: 8 (ref 5–15)
BUN: 28 mg/dL — ABNORMAL HIGH (ref 6–20)
CO2: 22 mmol/L (ref 22–32)
Calcium: 8.5 mg/dL — ABNORMAL LOW (ref 8.9–10.3)
Chloride: 100 mmol/L (ref 98–111)
Creatinine, Ser: 1.35 mg/dL — ABNORMAL HIGH (ref 0.44–1.00)
GFR, Estimated: 50 mL/min — ABNORMAL LOW (ref >60)
Glucose, Bld: 240 mg/dL — ABNORMAL HIGH (ref 70–99)
Potassium: 3.9 mmol/L (ref 3.5–5.1)
Sodium: 130 mmol/L — ABNORMAL LOW (ref 135–145)
Total Bilirubin: 0.4 mg/dL (ref 0.3–1.2)
Total Protein: 7 g/dL (ref 6.5–8.1)

## 2020-12-30 MED ORDER — INSULIN ASPART 100 UNIT/ML IJ SOLN
8.0000 [IU] | Freq: Three times a day (TID) | INTRAMUSCULAR | Status: DC
  Administered 2020-12-30 – 2021-01-05 (×17): 8 [IU] via SUBCUTANEOUS

## 2020-12-30 MED ORDER — INSULIN GLARGINE 100 UNIT/ML ~~LOC~~ SOLN
40.0000 [IU] | Freq: Two times a day (BID) | SUBCUTANEOUS | Status: DC
  Administered 2020-12-30 (×2): 40 [IU] via SUBCUTANEOUS
  Filled 2020-12-30 (×3): qty 0.4

## 2020-12-30 NOTE — Progress Notes (Signed)
PROGRESS NOTE    Karen Bennett  QIO:[id number] DOB: 12-08-1975 DOA: 12/26/2020 PCP: Pcp, No    Brief Narrative:  Morbidly obese 45 year old female history of type 2 diabetes mellitus, prior history of DVT/PE, right BKA, super morbid obesity, BMI of 68 presented to the ED with left lower extremity pain rash and blistering .  -Noted to have necrotizing fasciitis this was confirmed on imaging  -Started on broad-spectrum antibiotics -Surgery was consulted and underwent incision and drainage, debridement left thigh soft tissue infection on 12/27/20.    Assessment & Plan:   Left lateral thigh necrotizing fascitis -Sp I&D 6/6 by Dr. Doylene Canard -Wound culture with Klebsiella, follow-up sensitivities -IV Zosyn changed to Ancef yesterday based on cultures, day 4 of antibiotics -Strongly recommended short-term rehab for wound care, she is strongly opposed to this, will need home health RN supplemented by family/friend assistance to do dressing changes  Candida albicans fungemia -Has extensive body folds with MAST and fungal infections, -Currently on IV fluconazole day 2 -Appreciate infectious disease input, TEE recommended, will request cardiology assistance with this -Repeat blood cultures  Uncontrolled hyperglycemia, T2DM. Morbid obesity, BMI is 65 -Hemoglobin A1c is 15.5 -CBGs poorly controlled, increase Lantus and Premeal NovoLog -Needs to lose tremendous amount of weight -uses a motorized wheelchair  Right BKA  Hypertension -Stable, lisinopril on hold  Stage II right buttock pressure ulcer present on admission  Hx of DVT and PE.  -She had not been taking rivaroxaban as outpatient, this was resumed by my partner on admission  -Remains at high risk for VTE   AKI, hyponatremia.  -Likely secondary to fluid shifts, sodium is improving, continue to monitor -Lisinopril on hold  DVT prophylaxis: rivaroxaban   Code Status:   full  Family Communication:  No family at the bedside     Remains inpatient appropriate because:Inpatient level of care appropriate due to severity of illness   Dispo: The patient is from: Home              Anticipated d/c is to: Home              Patient currently is not medically stable to d/c.   Difficult to place patient No      Skin Documentation: Pressure Injury 12/27/20 Buttocks Right;Left Stage 2 -  Partial thickness loss of dermis presenting as a shallow open injury with a red, pink wound bed without slough. (Active)  12/27/20 1200  Location: Buttocks  Location Orientation: Right;Left  Staging: Stage 2 -  Partial thickness loss of dermis presenting as a shallow open injury with a red, pink wound bed without slough.  Wound Description (Comments):   Present on Admission:      Consultants:  Surgery   Procedures:  I&D left thigh    Subjective: -Feels okay overall, does not want to go to rehab, adamantly against this, wondering why home health nursing cannot come home every day to do her dressing, educated patient that this is not an option  Objective: Vitals:   12/29/20 0438 12/29/20 1348 12/29/20 2047 12/30/20 0439  BP: 140/67 138/67 126/65 (!) 152/70  Pulse: 82 78 65 81  Resp: 18 18 18 17   Temp:  98.2 F (36.8 C) 97.8 F (36.6 C) 99.2 F (37.3 C)  TempSrc:  Oral Oral Oral  SpO2: 94% 94% 92% 97%  Weight:      Height:        Intake/Output Summary (Last 24 hours) at 12/30/2020 1116 Last data filed at 12/30/2020  1000 Gross per 24 hour  Intake 1908.12 ml  Output 3350 ml  Net -1441.88 ml   Filed Weights   12/26/20 1843 12/27/20 0716  Weight: (!) 182.3 kg (!) 182.3 kg    Examination:   General: Morbidly obese chronically ill female laying in bed, awake alert oriented x3, no distress HEENT: Neck obese, no icterus CVS: S1-S2, regular rate rhythm regular lungs: Distant breath sounds Abdomen: Obese, soft, nontender with large abdominal wall pannus, bowel sounds present Extremities: Left lateral thigh wound with  dressing, right BKA, 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 Skin: As above   Data Reviewed: I have personally reviewed following labs and imaging studies  CBC: Recent Labs  Lab 12/26/20 2035 12/27/20 0315 12/28/20 0303 12/29/20 0451 12/30/20 0453  WBC 6.9 6.1 7.5 5.7 5.5  NEUTROABS 4.8 3.9 5.5 3.4  --   HGB 12.9 12.1 12.9 11.4* 11.9*  HCT 38.3 36.1 38.8 35.5* 37.7  MCV 86.7 85.7 86.8 91.5 91.3  PLT 263 251 243 285 303   Basic Metabolic Panel: Recent Labs  Lab 12/27/20 0316 12/27/20 1100 12/28/20 0303 12/29/20 0451 12/30/20 0453  NA 127* 127* 127* 131* 130*  K 4.1 3.7 4.6 3.9 3.9  CL 98 95* 97* 100 100  CO2 18* 24 19* 24 22  GLUCOSE 165* 209* 423* 265* 240*  BUN 19 21* 25* 30* 28*  CREATININE 1.08* 1.33* 1.37* 1.49* 1.35*  CALCIUM 8.5* 8.5* 8.4* 8.8* 8.5*   GFR: Estimated Creatinine Clearance: 92 mL/min (A) (by C-G formula based on SCr of 1.35 mg/dL (H)). Liver Function Tests: Recent Labs  Lab 12/26/20 2035 12/30/20 0453  AST 8* 9*  ALT 10 10  ALKPHOS 77 58  BILITOT 0.6 0.4  PROT 7.2 7.0  ALBUMIN 2.2* 2.3*   No results for input(s): LIPASE, AMYLASE in the last 168 hours. No results for input(s): AMMONIA in the last 168 hours. Coagulation Profile: No results for input(s): INR, PROTIME in the last 168 hours. Cardiac Enzymes: Recent Labs  Lab 12/28/20 0303  CKTOTAL 51   BNP (last 3 results) No results for input(s): PROBNP in the last 8760 hours. HbA1C: Recent Labs    12/27/20 1459  HGBA1C 15.5*   CBG: Recent Labs  Lab 12/29/20 0740 12/29/20 1200 12/29/20 1623 12/29/20 2055 12/30/20 0759  GLUCAP 221* 256* 234* 233* 203*   Lipid Profile: No results for input(s): CHOL, HDL, LDLCALC, TRIG, CHOLHDL, LDLDIRECT in the last 72 hours. Thyroid Function Tests: No results for input(s): TSH, T4TOTAL, FREET4, T3FREE, THYROIDAB in the last 72 hours. Anemia Panel: No results for input(s): VITAMINB12, FOLATE, FERRITIN, TIBC, IRON, RETICCTPCT in the last  72 hours.    Radiology Studies: I have reviewed all of the imaging during this hospital visit personally     Scheduled Meds:  acetaminophen  650 mg Oral Q6H   buPROPion  300 mg Oral Daily   Chlorhexidine Gluconate Cloth  6 each Topical Daily   insulin aspart  0-15 Units Subcutaneous TID WC   insulin aspart  8 Units Subcutaneous TID WC   insulin glargine  40 Units Subcutaneous BID   nystatin   Topical TID   pantoprazole  40 mg Oral Daily   rivaroxaban  20 mg Oral Q supper   traZODone  25 mg Oral QHS   venlafaxine XR  75 mg Oral Daily   Continuous Infusions:   ceFAZolin (ANCEF) IV 2 g (12/30/20 0616)   fluconazole (DIFLUCAN) IV 400 mg (12/30/20 0445)  LOS: 3 days    Zannie Cove, MD

## 2020-12-30 NOTE — Progress Notes (Signed)
Progress Note  3 Days Post-Op  Subjective: Patient reports some soreness in wound but overall pain well controlled. She reports she has 2 friends that may be able to help with dressing changes at home. She does not want to go to a SNF.   Objective: Vital signs in last 24 hours: Temp:  [97.8 F (36.6 C)-99.2 F (37.3 C)] 99.2 F (37.3 C) (06/09 0439) Pulse Rate:  [65-81] 81 (06/09 0439) Resp:  [17-18] 17 (06/09 0439) BP: (126-152)/(65-70) 152/70 (06/09 0439) SpO2:  [92 %-97 %] 97 % (06/09 0439) Last BM Date: 12/28/20  Intake/Output from previous day: 06/08 0701 - 06/09 0700 In: 1788.1 [P.O.:1440; IV Piggyback:348.1] Out: 3125 [Urine:3125] Intake/Output this shift: Total I/O In: -  Out: 125 [Urine:125]  PE: Ext: LLE wound with decrease in erythema, still with tenderness and some surrounding induration anteromedially.  Wound is overall clean with no purulent drainage, some fibrinous tissue in wound base that will clean up with dressing changes. Measures 7.5 x 3.5 x 4.5 cm     Lab Results:  Recent Labs    12/29/20 0451 12/30/20 0453  WBC 5.7 5.5  HGB 11.4* 11.9*  HCT 35.5* 37.7  PLT 285 303   BMET Recent Labs    12/29/20 0451 12/30/20 0453  NA 131* 130*  K 3.9 3.9  CL 100 100  CO2 24 22  GLUCOSE 265* 240*  BUN 30* 28*  CREATININE 1.49* 1.35*  CALCIUM 8.8* 8.5*   PT/INR No results for input(s): LABPROT, INR in the last 72 hours. CMP     Component Value Date/Time   NA 130 (L) 12/30/2020 0453   K 3.9 12/30/2020 0453   CL 100 12/30/2020 0453   CO2 22 12/30/2020 0453   GLUCOSE 240 (H) 12/30/2020 0453   BUN 28 (H) 12/30/2020 0453   CREATININE 1.35 (H) 12/30/2020 0453   CREATININE 0.70 09/01/2013 0954   CALCIUM 8.5 (L) 12/30/2020 0453   PROT 7.0 12/30/2020 0453   ALBUMIN 2.3 (L) 12/30/2020 0453   AST 9 (L) 12/30/2020 0453   ALT 10 12/30/2020 0453   ALKPHOS 58 12/30/2020 0453   BILITOT 0.4 12/30/2020 0453   GFRNONAA 50 (L) 12/30/2020 0453    GFRNONAA >89 09/01/2013 0954   GFRAA >89 09/01/2013 0954   Lipase     Component Value Date/Time   LIPASE 20 09/05/2011 0120       Studies/Results: No results found.  Anti-infectives: Anti-infectives (From admission, onward)    Start     Dose/Rate Route Frequency Ordered Stop   12/30/20 0400  fluconazole (DIFLUCAN) IVPB 400 mg        400 mg 100 mL/hr over 120 Minutes Intravenous Every 24 hours 12/29/20 0314     12/29/20 1800  cefTRIAXone (ROCEPHIN) 2 g in sodium chloride 0.9 % 100 mL IVPB  Status:  Discontinued        2 g 200 mL/hr over 30 Minutes Intravenous Every 24 hours 12/29/20 1234 12/29/20 1438   12/29/20 1530  ceFAZolin (ANCEF) IVPB 2g/100 mL premix        2 g 200 mL/hr over 30 Minutes Intravenous Every 8 hours 12/29/20 1438     12/29/20 0400  fluconazole (DIFLUCAN) IVPB 400 mg        400 mg 100 mL/hr over 120 Minutes Intravenous Every 2 hours 12/29/20 0314 12/29/20 0754   12/27/20 2000  DAPTOmycin (CUBICIN) 650 mg in sodium chloride 0.9 % IVPB  Status:  Discontinued  6 mg/kg  108.5 kg (Adjusted) 126 mL/hr over 30 Minutes Intravenous Daily 12/27/20 1229 12/27/20 1454   12/27/20 1000  vancomycin (VANCOREADY) IVPB 2000 mg/400 mL  Status:  Discontinued        2,000 mg 200 mL/hr over 120 Minutes Intravenous Every 12 hours 12/27/20 0049 12/27/20 1218   12/27/20 0923  vancomycin (VANCOCIN) 1-5 GM/200ML-% IVPB       Note to Pharmacy: Ramond Craver   : cabinet override      12/27/20 0923 12/27/20 2129   12/27/20 0923  piperacillin-tazobactam (ZOSYN) 3.375 GM/50ML IVPB       Note to Pharmacy: Ramond Craver   : cabinet override      12/27/20 0923 12/27/20 0928   12/27/20 0200  piperacillin-tazobactam (ZOSYN) IVPB 3.375 g  Status:  Discontinued        3.375 g 12.5 mL/hr over 240 Minutes Intravenous Every 8 hours 12/27/20 0049 12/29/20 1234   12/26/20 2000  vancomycin (VANCOREADY) IVPB 2000 mg/400 mL        2,000 mg 200 mL/hr over 120 Minutes Intravenous Once  12/26/20 1954 12/27/20 0136   12/26/20 1945  piperacillin-tazobactam (ZOSYN) IVPB 3.375 g        3.375 g 100 mL/hr over 30 Minutes Intravenous  Once 12/26/20 1930 12/27/20 0136   12/26/20 1945  vancomycin (VANCOCIN) 2,000 mg in sodium chloride 0.9 % 500 mL IVPB  Status:  Discontinued        2,000 mg 250 mL/hr over 120 Minutes Intravenous  Once 12/26/20 1931 12/26/20 1953        Assessment/Plan POD 3 s/p I&D of L thigh abscess, Dr. Fredricka Bonine - dry dressing changes BID to thigh wound - cx with klebsiella resistant to ampicillin - ID recommending ancef - mobilize as able, has BKA on R side - continue efforts for glycemic control- she has continued to have consistent BG>200  Candidemia - fluconazole per ID, TTE to r/o endocarditis   FEN - carb mod diet VTE - Lovenox ID - ancef 6/8>>   Morbid obesity DM HTN D/O DVT/PE AKI  LOS: 3 days    Juliet Rude, [facility] Surgery 12/30/2020, 9:18 AM Please see Amion for pager number during day hours 7:00am-4:30pm

## 2020-12-30 NOTE — Care Management Important Message (Signed)
Important Message  Patient Details IM Letter given to the Patient. Name: Karen Bennett MRN: [id number] Date of Birth: 02/03/1976   Medicare Important Message Given:  Yes     [name] 12/30/2020, 11:02 AM

## 2020-12-30 NOTE — Progress Notes (Signed)
[facility]    Date of Admission:  12/26/2020   Total days of antibiotics 5/ day 2 cefazolin & day 3 fluconazole          ID: Karen Bennett is a 45 y.o. female with   Principal Problem:   Necrotizing fasciitis of lower leg (HCC) Thigh  Active Problems:   DM (diabetes mellitus), type 2, uncontrolled with complications (HCC)   Anxiety and depression   Cellulitis   BMI 60.0-69.9, adult (HCC)   HTN (hypertension)   History of pulmonary embolism   Pressure injury of skin    Subjective: Has some discomfort when doing dressing changes to her wound on left thigh; no difficulty with vision Medications:   acetaminophen  650 mg Oral Q6H   buPROPion  300 mg Oral Daily   Chlorhexidine Gluconate Cloth  6 each Topical Daily   insulin aspart  0-15 Units Subcutaneous TID WC   insulin aspart  8 Units Subcutaneous TID WC   insulin glargine  40 Units Subcutaneous BID   nystatin   Topical TID   pantoprazole  40 mg Oral Daily   rivaroxaban  20 mg Oral Q supper   traZODone  25 mg Oral QHS   venlafaxine XR  75 mg Oral Daily    Objective: Vital signs in last 24 hours: Temp:  [97.8 F (36.6 C)-99.2 F (37.3 C)] 97.8 F (36.6 C) (06/09 1409) Pulse Rate:  [65-81] 65 (06/09 1409) Resp:  [17-18] 18 (06/09 1409) BP: (121-152)/(63-70) 121/63 (06/09 1409) SpO2:  [92 %-97 %] 93 % (06/09 1409) Physical Exam  Constitutional:  oriented to person, place, and time. appears well-developed and well-nourished. No distress.  HENT: [place]/AT, PERRLA, no scleral icterus Mouth/Throat: Oropharynx is clear and moist. No oropharyngeal exudate.  Cardiovascular: Normal rate, regular rhythm and normal heart sounds. Exam reveals no gallop and no friction rub.  No murmur heard.  Pulmonary/Chest: Effort normal and breath sounds normal. No respiratory distress.  has no wheezes.  Neck = supple, no nuchal rigidity Abdominal: Soft. Bowel sounds are normal.  exhibits no distension. There is no  tenderness.  Skin: left thigh wound bed some fibrinous debris. 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  behavior is normal.    Lab Results Recent Labs    12/29/20 0451 12/30/20 0453  WBC 5.7 5.5  HGB 11.4* 11.9*  HCT 35.5* 37.7  NA 131* 130*  K 3.9 3.9  CL 100 100  CO2 24 22  BUN 30* 28*  CREATININE 1.49* 1.35*   Liver Panel Recent Labs    12/30/20 0453  PROT 7.0  ALBUMIN 2.3*  AST 9*  ALT 10  ALKPHOS 58  BILITOT 0.4     Microbiology: Blood cx NGTD x 2: 6/9 Blood cx 6/5 :candida albicans Blood cx 6/6: no growth to date Wound cx 6/6: Klebsiella pneumoniae      MIC    AMPICILLIN >=32 RESIST... Resistant    AMPICILLIN/SULBACTAM 4 SENSITIVE  Sensitive    CEFAZOLIN <=4 SENSITIVE  Sensitive    CEFEPIME <=0.12 SENS... Sensitive    CEFTAZIDIME <=1 SENSITIVE  Sensitive    CEFTRIAXONE <=0.25 SENS... Sensitive    CIPROFLOXACIN <=0.25 SENS... Sensitive    GENTAMICIN <=1 SENSITIVE  Sensitive    IMIPENEM <=0.25 SENS... Sensitive    PIP/TAZO <=4 SENSITIVE  Sensitive    TRIMETH/SULFA <=20 SENSIT... Sensitive  Studies/Results: No results found.   Assessment/Plan: Candidemia = unclear source. Has cutaneous candidal skin infection in abdominal folds but no significant rash. No fungal elements noted on wound cx from the OR debridement  Being scheduled for TEE on Monday. Continue on fluconazole  Deep tissue infection = + klebsiella. Continue on cefazolin. Will convert to orals when ready for discharge. Continue with wound care recs. Consider santyl for the fibrinous/necrotic area.   [facility] Infectious Diseases Cell: [phone] Pager: [phone]  12/30/2020, 4:02 PM

## 2020-12-30 NOTE — TOC Progression Note (Signed)
Transition of Care [facility]) - Progression Note    Patient Details  Name: Karen Bennett MRN: [id number] Date of Birth: Nov 25, 1975  [facility] CM/SW Contact  Lennart Pall, LCSW Phone Number: 12/30/2020, 3:40 PM  Clinical Narrative:    Met with patient this afternoon to introduce myself and discuss dc needs.  Pt very pleasant and talks easily with me about her living situation.  Pt reports she lives in a handicap accessible apartment and using a power w/c for all mobility.  She has active [facility] aide services 7 days/ wk from 1p-6p (she cannot recall name of agency but fully intends to notify her aide directly when she is ready for dc to restart services.)  She is aware that primary concern is wound care when she returns home and stressed that a [facility] will only make a couple of visits/week. She states that she has spoken with her friend, Myriam Jacobson, who has agreed to assist with wound care.  Explained to pt that she should ask this friend to plan to meet with nursing at the hospital when nearing dc readiness to make sure she is comfortable with wound care.  Pt aware that SNF has been offered as a possible dc option, however, she does NOT want to consider this and understands she must be able to show a safe dc support arrangement.  TOC will continue to follow.   Expected Discharge Plan: [place] Barriers to Discharge: Continued Medical Work up  Expected Discharge Plan and Services Expected Discharge Plan: [place]   Discharge Planning Services: CM Consult   Living arrangements for the past 2 months: Apartment                                       Social Determinants of Health (SDOH) Interventions    Readmission Risk Interventions No flowsheet data found.

## 2020-12-31 LAB — BASIC METABOLIC PANEL
Anion gap: 6 (ref 5–15)
BUN: 26 mg/dL — ABNORMAL HIGH (ref 6–20)
CO2: 27 mmol/L (ref 22–32)
Calcium: 8.6 mg/dL — ABNORMAL LOW (ref 8.9–10.3)
Chloride: 102 mmol/L (ref 98–111)
Creatinine, Ser: 1.12 mg/dL — ABNORMAL HIGH (ref 0.44–1.00)
GFR, Estimated: >60 mL/min (ref >60)
Glucose, Bld: 175 mg/dL — ABNORMAL HIGH (ref 70–99)
Potassium: 4.2 mmol/L (ref 3.5–5.1)
Sodium: 135 mmol/L (ref 135–145)

## 2020-12-31 LAB — GLUCOSE, CAPILLARY
Glucose-Capillary: 175 mg/dL — ABNORMAL HIGH (ref 70–99)
Glucose-Capillary: 164 mg/dL — ABNORMAL HIGH (ref 70–99)
Glucose-Capillary: 210 mg/dL — ABNORMAL HIGH (ref 70–99)
Glucose-Capillary: 152 mg/dL — ABNORMAL HIGH (ref 70–99)

## 2020-12-31 LAB — CBC
HCT: 36.6 % (ref 36.0–46.0)
Hemoglobin: 11.5 g/dL — ABNORMAL LOW (ref 12.0–15.0)
MCH: 29.1 pg (ref 26.0–34.0)
MCHC: 31.4 g/dL (ref 30.0–36.0)
MCV: 92.7 fL (ref 80.0–100.0)
Platelets: 296 10*3/uL (ref 150–400)
RBC: 3.95 MIL/uL (ref 3.87–5.11)
RDW: 13.5 % (ref 11.5–15.5)
WBC: 5.6 10*3/uL (ref 4.0–10.5)
nRBC: 0 % (ref 0.0–0.2)

## 2020-12-31 MED ORDER — ADULT MULTIVITAMIN W/MINERALS CH
1.0000 | ORAL_TABLET | Freq: Every day | ORAL | Status: DC
  Administered 2020-12-31 – 2021-01-05 (×5): 1 via ORAL
  Filled 2020-12-31 (×5): qty 1

## 2020-12-31 MED ORDER — INSULIN GLARGINE 100 UNIT/ML ~~LOC~~ SOLN
45.0000 [IU] | Freq: Two times a day (BID) | SUBCUTANEOUS | Status: DC
  Administered 2020-12-31 – 2021-01-05 (×12): 45 [IU] via SUBCUTANEOUS
  Filled 2020-12-31 (×13): qty 0.45

## 2020-12-31 MED ORDER — JUVEN PO PACK
1.0000 | PACK | Freq: Two times a day (BID) | ORAL | Status: DC
  Administered 2020-12-31 – 2021-01-05 (×9): 1 via ORAL
  Filled 2020-12-31 (×12): qty 1

## 2020-12-31 MED ORDER — ENSURE MAX PROTEIN PO LIQD
11.0000 [oz_av] | Freq: Two times a day (BID) | ORAL | Status: DC
  Administered 2020-12-31 – 2021-01-05 (×9): 11 [oz_av] via ORAL

## 2020-12-31 NOTE — Progress Notes (Signed)
Physical Therapy Treatment Patient Details Name: Karen Bennett MRN: [id number] DOB: 02/17/1976 Today's Date: 12/31/2020    History of Present Illness 45 year old female  who presented with left lower extremity pain.  Reported 7 days of erythema at the distal left thigh, that progressed to blistering lesions, associated with spreading painful erythema. Dx of necrotizing fasciitis, s/p I&D 12/27/2020. Past medical history:  type 2 diabetes mellitus, diabetic neuropathy, history of PE/DVT, status post right BKA 2019, hypertension, dyslipidemia, depression, anxiety and obesity class III    PT Comments    Patient progressing well with acute PT and bariatric recliner requested and delivered to room. Patient required supervision for ant/post scooting in bed and pt Mod Independent for rolling in bed to change linens. Pt completed T-transfer to recliner with min assist to manage bed pad and prevent sheering on butt and posterior thighs. She will continue to benefit from skilled PT interventions to address deficits and maintain functional independence with mobility.      Follow Up Recommendations  Home health PT     Equipment Recommendations  None recommended by PT    Recommendations for Other Services       Precautions / Restrictions Precautions Precautions: Fall Precaution Comments: no falls in past 1 year Restrictions Weight Bearing Restrictions: No    Mobility  Bed Mobility Overal bed mobility: Needs Assistance Bed Mobility: Rolling Rolling: Modified independent (Device/Increase time)         General bed mobility comments: rolling L and R for linen change and pericare, used bed rail, no assist needed    Transfers Overall transfer level: Needs assistance Equipment used: None Transfers: Licensed conveyancer transfers: Supervision;Min assist   General transfer comment: supervision for ant/post scoots in inflated bed. min assist for bed pad  to prevent sheering for posterior scoot to recliner in deflated bed. 2nd person blockgin recliner to prevent form sliding out (chair also locked)  Ambulation/Gait                 Stairs             Wheelchair Mobility    Modified Rankin (Stroke Patients Only)       Balance Overall balance assessment: Needs assistance   Sitting balance-Leahy Scale: Good         Standing balance comment: NT                            Cognition Arousal/Alertness: Awake/alert Behavior During Therapy: WFL for tasks assessed/performed Overall Cognitive Status: Within Functional Limits for tasks assessed                                        Exercises      General Comments        Pertinent Vitals/Pain Pain Assessment: No/denies pain    Home Living                      Prior Function            PT Goals (current goals can now be found in the care plan section) Acute Rehab PT Goals Patient Stated Goal: likes to cook, color, garden PT Goal Formulation: With patient Time For Goal Achievement: 01/12/21 Potential to Achieve Goals: Fair Progress towards PT goals: Progressing toward goals  Frequency    Min 3X/week      PT Plan Current plan remains appropriate    Co-evaluation              AM-PAC PT "6 Clicks" Mobility   Outcome Measure  Help needed turning from your back to your side while in a flat bed without using bedrails?: A Little Help needed moving from lying on your back to sitting on the side of a flat bed without using bedrails?: A Little Help needed moving to and from a bed to a chair (including a wheelchair)?: A Little Help needed standing up from a chair using your arms (e.g., wheelchair or bedside chair)?: Total Help needed to walk in hospital room?: Total Help needed climbing 3-5 steps with a railing? : Total 6 Click Score: 12    End of Session   Activity Tolerance: Patient tolerated treatment  well Patient left: in chair;with call bell/phone within reach Nurse Communication: Mobility status PT Visit Diagnosis: Pain;Other abnormalities of gait and mobility (R26.89) Pain - Right/Left: Left Pain - part of body: Leg     Time: 7591-6384 PT Time Calculation (min) (ACUTE ONLY): 32 min  Charges:  $Therapeutic Activity: 23-37 mins                     Wynn Maudlin, DPT Acute Rehabilitation Services Office [phone] Pager [phone]    Anitra Lauth 12/31/2020, 1:20 PM

## 2020-12-31 NOTE — Progress Notes (Signed)
Initial Nutrition Assessment  DOCUMENTATION CODES:   Morbid obesity  INTERVENTION:   -Ensure MAX Protein po BID, each supplement provides 150 kcal and 30 grams of protein   -Juven Fruit Punch BID, each serving provides 95kcal and 2.5g of protein (amino acids glutamine and arginine)   -Multivitamin with minerals daily  Recommend: -Vitamin C 250 mg BID -Zinc 220 mg  daily x 2 weeks max  NUTRITION DIAGNOSIS:   Increased nutrient needs related to wound healing as evidenced by estimated needs.  GOAL:   Patient will meet greater than or equal to 90% of their needs  MONITOR:   PO intake, Supplement acceptance, Labs, Weight trends, I & O's, Skin  REASON FOR ASSESSMENT:    (Wound)    ASSESSMENT:   45 year old female history of type 2 diabetes mellitus, prior history of DVT/PE, right BKA, super morbid obesity, BMI of 68 presented to the ED with left lower extremity pain rash and blistering .  -Noted to have necrotizing fasciitis this was confirmed on imaging  -Started on broad-spectrum antibiotics  -Surgery was consulted and underwent incision and drainage, debridement left thigh soft tissue infection on 12/27/20.  Patient in room, states she is eating well. PO 100% of meals. States at home she was only eating 2 meals a day. Reviewed with pt the importance of eating consistently for wound healing and controlling blood sugars. Pt with increased needs d/t wounds. Pt agreeable to drinking supplements and taking Juven.   Per weight records, no weight loss noted.  Medications reviewed.  Labs reviewed: CBGs: 152-240  NUTRITION - FOCUSED PHYSICAL EXAM:  No depletions noted.  H/o left BKA.  Diet Order:   Diet Order             Diet Carb Modified Fluid consistency: Thin; Room service appropriate? Yes  Diet effective now                   EDUCATION NEEDS:   Education needs have been addressed  Skin:  Skin Assessment: Skin Integrity Issues: Skin Integrity Issues:: Stage  II, Incisions Stage II: buttocks Incisions: left thigh  Last BM:  6/8  Height:   Ht Readings from Last 1 Encounters:  12/27/20 5\' 6"  (1.676 m)    Weight:   Wt Readings from Last 1 Encounters:  12/27/20 (!) 182.3 kg    BMI:  Body mass index is 68.4 kg/m. -adjusted for BKA  Estimated Nutritional Needs:   Kcal:  2300-2500  Protein:  120-135g  Fluid:  2.2L/day  02/26/21, MS, RD, LDN Inpatient Clinical Dietitian Contact information available via Amion

## 2020-12-31 NOTE — Progress Notes (Signed)
[facility]    Date of Admission:  12/26/2020   Total days of antibiotics 6   ID: Karen Bennett is a 45 y.o. female with  deep tissue infection and candidemia Principal Problem:   Necrotizing fasciitis of lower leg (HCC) Thigh  Active Problems:   DM (diabetes mellitus), type 2, uncontrolled with complications (HCC)   Anxiety and depression   Cellulitis   BMI 60.0-69.9, adult (HCC)   HTN (hypertension)   History of pulmonary embolism   Pressure injury of skin    Subjective: Afebrile. Marland Kitchen Her wound is not as tender. Going to start hydrotherapy  Medications:   acetaminophen  650 mg Oral Q6H   buPROPion  300 mg Oral Daily   Chlorhexidine Gluconate Cloth  6 each Topical Daily   insulin aspart  0-15 Units Subcutaneous TID WC   insulin aspart  8 Units Subcutaneous TID WC   insulin glargine  45 Units Subcutaneous BID   multivitamin with minerals  1 tablet Oral Daily   nutrition supplement (JUVEN)  1 packet Oral BID BM   nystatin   Topical TID   pantoprazole  40 mg Oral Daily   Ensure Max Protein  11 oz Oral BID   rivaroxaban  20 mg Oral Q supper   traZODone  25 mg Oral QHS   venlafaxine XR  75 mg Oral Daily    Objective: Vital signs in last 24 hours: Temp:  [98 F (36.7 C)-98.3 F (36.8 C)] 98 F (36.7 C) (06/10 1247) Pulse Rate:  [62-66] 62 (06/10 1247) Resp:  [17-18] 18 (06/10 1247) BP: (140-149)/(78-87) 149/87 (06/10 1247) SpO2:  [95 %-97 %] 97 % (06/10 1247) Physical Exam  Constitutional:  oriented to person, place, and time. appears well-developed and well-nourished. No distress.  HENT: [place]/AT, PERRLA, no scleral icterus Mouth/Throat: Oropharynx is clear and moist. No oropharyngeal exudate.  Cardiovascular: Normal rate, regular rhythm and normal heart sounds. Exam reveals no gallop and no friction rub.  No murmur heard.  Pulmonary/Chest: Effort normal and breath sounds normal. No respiratory distress.  has no wheezes.  Neck = supple, no  nuchal rigidity Abdominal: Soft. Bowel sounds are normal.  exhibits no distension. There is no tenderness.  Ext= left thigh wound bed packed/dressed. No surrounding erythema Neurological: alert and oriented to person, place, and time.  Skin: Skin is warm and dry. No rash noted. No erythema.  Psychiatric: a normal mood and affect.  behavior is normal.    Lab Results Recent Labs    12/30/20 0453 12/31/20 0439  WBC 5.5 5.6  HGB 11.9* 11.5*  HCT 37.7 36.6  NA 130* 135  K 3.9 4.2  CL 100 102  CO2 22 27  BUN 28* 26*  CREATININE 1.35* 1.12*   Liver Panel Recent Labs    12/30/20 0453  PROT 7.0  ALBUMIN 2.3*  AST 9*  ALT 10  ALKPHOS 58  BILITOT 0.4    Microbiology: Follow up blood cx ngtd Studies/Results: No results found.   Assessment/Plan: Kleb deep tissue infection of left thigh = continue with cefazolin for the time being. Agree with plan for hydro therapy  Candidemia = will have TEE on Monday to see if endocarditis is ruled out; then will treat for short course. Fluconazole sensitivity pending but anticipate it is working since labs are improved   Will see back on Monday  [facility] Cell: [phone] Pager: [phone]  12/31/2020, 4:24 PM

## 2020-12-31 NOTE — Progress Notes (Signed)
PROGRESS NOTE    Karen Bennett  VOZ:[id number] DOB: June 30, 1976 DOA: 12/26/2020 PCP: Pcp, No    Brief Narrative:  Morbidly obese 45 year old female history of type 2 diabetes mellitus, prior history of DVT/PE, right BKA, super morbid obesity, BMI of 68 presented to the ED with left lower extremity pain rash and blistering .  -Noted to have necrotizing fasciitis this was confirmed on imaging  -Started on broad-spectrum antibiotics -Surgery was consulted and underwent incision and drainage, debridement left thigh soft tissue infection on 12/27/20.    Assessment & Plan:   Left lateral thigh abscess -Sp I&D 6/6 by Dr. Doylene Canard -Wound culture with Klebsiella -IV Zosyn changed to Ancef yesterday based on cultures, day 5 of antibiotics -Discussed short-term rehab for wound care, she is adamantly opposed to this, will need home health RN supplemented by family/friend assistance to do dressing changes  Candida albicans fungemia -Has extensive body folds with MASD and cutaneous Candida infection -Currently on IV fluconazole day 3 -OR cultures from thigh abscess negative for yeast -Appreciate infectious disease input, TEE recommended, discussed with cardiology, do not have any openings till Monday  -Follow-up repeat blood cultures  Uncontrolled hyperglycemia, T2DM. Morbid obesity, BMI is 65 -Hemoglobin A1c is 15.5 -CBGs improving but still suboptimal, increase Lantus, continue Premeal NovoLog -Needs to lose tremendous amount of weight -uses a motorized wheelchair  Right BKA  Hypertension -Stable, lisinopril on hold  Stage II right buttock pressure ulcer present on admission  Hx of DVT and PE.  -She had not been taking rivaroxaban as outpatient, this was resumed by my partner on admission  -Remains at high risk for VTE   AKI, hyponatremia.  -Resolved, lisinopril on hold  DVT prophylaxis: rivaroxaban   Code Status:   full  Family Communication:  No family at the bedside     Remains inpatient appropriate because:Inpatient level of care appropriate due to severity of illness   Dispo: The patient is from: Home              Anticipated d/c is to: Home              Patient currently is not medically stable to d/c.   Difficult to place patient No      Skin Documentation: Pressure Injury 12/27/20 Buttocks Right;Left Stage 2 -  Partial thickness loss of dermis presenting as a shallow open injury with a red, pink wound bed without slough. (Active)  12/27/20 1200  Location: Buttocks  Location Orientation: Right;Left  Staging: Stage 2 -  Partial thickness loss of dermis presenting as a shallow open injury with a red, pink wound bed without slough.  Wound Description (Comments):   Present on Admission:      Consultants:  Surgery   Procedures:  I&D left thigh    Subjective: -Feels okay, denies any specific complaints, no events overnight, adamantly declines SNF for wound care Objective: Vitals:   12/30/20 0439 12/30/20 1409 12/30/20 2256 12/31/20 0614  BP: (!) 152/70 121/63 140/79 (!) 144/78  Pulse: 81 65 65 66  Resp: 17 18 17 18   Temp: 99.2 F (37.3 C) 97.8 F (36.6 C) 98.3 F (36.8 C) 98 F (36.7 C)  TempSrc: Oral Oral Oral Oral  SpO2: 97% 93% 95% 96%  Weight:      Height:        Intake/Output Summary (Last 24 hours) at 12/31/2020 1130 Last data filed at 12/31/2020 0630 Gross per 24 hour  Intake 2120 ml  Output 2250 ml  Net -130 ml   Filed Weights   12/26/20 1843 12/27/20 0716  Weight: (!) 182.3 kg (!) 182.3 kg    Examination:   General: Morbidly obese chronically ill female laying in bed, AAOx3, no distress HEENT: Neck obese, no icterus CVS: S1-S2, regular rate rhythm Lungs: Distant breath sounds Abdomen: Obese, soft, nontender, large abdominal wall pannus, bowel sounds present Extremities: Left lateral thigh wound with dressing, right BKA, 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 Skin: As above   Data Reviewed: I have  personally reviewed following labs and imaging studies  CBC: Recent Labs  Lab 12/26/20 2035 12/27/20 0315 12/28/20 0303 12/29/20 0451 12/30/20 0453 12/31/20 0439  WBC 6.9 6.1 7.5 5.7 5.5 5.6  NEUTROABS 4.8 3.9 5.5 3.4  --   --   HGB 12.9 12.1 12.9 11.4* 11.9* 11.5*  HCT 38.3 36.1 38.8 35.5* 37.7 36.6  MCV 86.7 85.7 86.8 91.5 91.3 92.7  PLT 263 251 243 285 303 296   Basic Metabolic Panel: Recent Labs  Lab 12/27/20 1100 12/28/20 0303 12/29/20 0451 12/30/20 0453 12/31/20 0439  NA 127* 127* 131* 130* 135  K 3.7 4.6 3.9 3.9 4.2  CL 95* 97* 100 100 102  CO2 24 19* 24 22 27   GLUCOSE 209* 423* 265* 240* 175*  BUN 21* 25* 30* 28* 26*  CREATININE 1.33* 1.37* 1.49* 1.35* 1.12*  CALCIUM 8.5* 8.4* 8.8* 8.5* 8.6*   GFR: Estimated Creatinine Clearance: 110.9 mL/min (A) (by C-G formula based on SCr of 1.12 mg/dL (H)). Liver Function Tests: Recent Labs  Lab 12/26/20 2035 12/30/20 0453  AST 8* 9*  ALT 10 10  ALKPHOS 77 58  BILITOT 0.6 0.4  PROT 7.2 7.0  ALBUMIN 2.2* 2.3*   No results for input(s): LIPASE, AMYLASE in the last 168 hours. No results for input(s): AMMONIA in the last 168 hours. Coagulation Profile: No results for input(s): INR, PROTIME in the last 168 hours. Cardiac Enzymes: Recent Labs  Lab 12/28/20 0303  CKTOTAL 51   BNP (last 3 results) No results for input(s): PROBNP in the last 8760 hours. HbA1C: No results for input(s): HGBA1C in the last 72 hours.  CBG: Recent Labs  Lab 12/30/20 0759 12/30/20 1143 12/30/20 1633 12/30/20 2254 12/31/20 0732  GLUCAP 203* 240* 152* 205* 175*   Lipid Profile: No results for input(s): CHOL, HDL, LDLCALC, TRIG, CHOLHDL, LDLDIRECT in the last 72 hours. Thyroid Function Tests: No results for input(s): TSH, T4TOTAL, FREET4, T3FREE, THYROIDAB in the last 72 hours. Anemia Panel: No results for input(s): VITAMINB12, FOLATE, FERRITIN, TIBC, IRON, RETICCTPCT in the last 72 hours.  Scheduled Meds:  acetaminophen   650 mg Oral Q6H   buPROPion  300 mg Oral Daily   Chlorhexidine Gluconate Cloth  6 each Topical Daily   insulin aspart  0-15 Units Subcutaneous TID WC   insulin aspart  8 Units Subcutaneous TID WC   insulin glargine  45 Units Subcutaneous BID   multivitamin with minerals  1 tablet Oral Daily   nutrition supplement (JUVEN)  1 packet Oral BID BM   nystatin   Topical TID   pantoprazole  40 mg Oral Daily   Ensure Max Protein  11 oz Oral BID   rivaroxaban  20 mg Oral Q supper   traZODone  25 mg Oral QHS   venlafaxine XR  75 mg Oral Daily   Continuous Infusions:   ceFAZolin (ANCEF) IV 2 g (12/31/20 0630)   fluconazole (DIFLUCAN) IV 400 mg (12/31/20 0408)  LOS: 4 days    Zannie Cove, MD

## 2020-12-31 NOTE — Progress Notes (Signed)
    [facility] has been requested to perform a transesophageal echocardiogram on Karen Bennett for Candida albicans fungemia.   After careful review of history and examination, the risks and benefits of transesophageal echocardiogram have been explained including risks of esophageal damage, perforation (1:10,000 risk), bleeding, pharyngeal hematoma as well as other potential complications associated with conscious sedation including aspiration, arrhythmia, respiratory failure and death. Alternatives to treatment were discussed, questions were answered. Patient is willing to proceed.  TEE - Dr. Dr. Mayford Knife 01/03/21 @ 1030. NPO after midnight. Meds with sips.   Manson Passey, PA-C 12/31/2020 3:56 PM

## 2020-12-31 NOTE — Progress Notes (Signed)
Progress Note  4 Days Post-Op  Subjective: Patient overall feeling well.  No new complaints   Objective: Vital signs in last 24 hours: Temp:  [97.8 F (36.6 C)-98.3 F (36.8 C)] 98 F (36.7 C) (06/10 0614) Pulse Rate:  [65-66] 66 (06/10 0614) Resp:  [17-18] 18 (06/10 0614) BP: (121-144)/(63-79) 144/78 (06/10 0614) SpO2:  [93 %-96 %] 96 % (06/10 0614) Last BM Date: 12/29/20  Intake/Output from previous day: 06/09 0701 - 06/10 0700 In: 2789.5 [P.O.:2400; IV Piggyback:389.5] Out: 2975 [Urine:2975] Intake/Output this shift: No intake/output data recorded.  PE: Ext: LLE wound with decrease in erythema, still with tenderness as expected.  Wound is  more soupy today but was not packed well overnight and generally just isn't as clean today as it has been.  Lab Results:  Recent Labs    12/30/20 0453 12/31/20 0439  WBC 5.5 5.6  HGB 11.9* 11.5*  HCT 37.7 36.6  PLT 303 296   BMET Recent Labs    12/30/20 0453 12/31/20 0439  NA 130* 135  K 3.9 4.2  CL 100 102  CO2 22 27  GLUCOSE 240* 175*  BUN 28* 26*  CREATININE 1.35* 1.12*  CALCIUM 8.5* 8.6*   PT/INR No results for input(s): LABPROT, INR in the last 72 hours. CMP     Component Value Date/Time   NA 135 12/31/2020 0439   K 4.2 12/31/2020 0439   CL 102 12/31/2020 0439   CO2 27 12/31/2020 0439   GLUCOSE 175 (H) 12/31/2020 0439   BUN 26 (H) 12/31/2020 0439   CREATININE 1.12 (H) 12/31/2020 0439   CREATININE 0.70 09/01/2013 0954   CALCIUM 8.6 (L) 12/31/2020 0439   PROT 7.0 12/30/2020 0453   ALBUMIN 2.3 (L) 12/30/2020 0453   AST 9 (L) 12/30/2020 0453   ALT 10 12/30/2020 0453   ALKPHOS 58 12/30/2020 0453   BILITOT 0.4 12/30/2020 0453   GFRNONAA >60 12/31/2020 0439   GFRNONAA >89 09/01/2013 0954   GFRAA >89 09/01/2013 0954   Lipase     Component Value Date/Time   LIPASE 20 09/05/2011 0120       Studies/Results: No results found.  Anti-infectives: Anti-infectives (From admission, onward)     Start     Dose/Rate Route Frequency Ordered Stop   12/30/20 0400  fluconazole (DIFLUCAN) IVPB 400 mg        400 mg 100 mL/hr over 120 Minutes Intravenous Every 24 hours 12/29/20 0314     12/29/20 1800  cefTRIAXone (ROCEPHIN) 2 g in sodium chloride 0.9 % 100 mL IVPB  Status:  Discontinued        2 g 200 mL/hr over 30 Minutes Intravenous Every 24 hours 12/29/20 1234 12/29/20 1438   12/29/20 1530  ceFAZolin (ANCEF) IVPB 2g/100 mL premix        2 g 200 mL/hr over 30 Minutes Intravenous Every 8 hours 12/29/20 1438     12/29/20 0400  fluconazole (DIFLUCAN) IVPB 400 mg        400 mg 100 mL/hr over 120 Minutes Intravenous Every 2 hours 12/29/20 0314 12/29/20 0754   12/27/20 2000  DAPTOmycin (CUBICIN) 650 mg in sodium chloride 0.9 % IVPB  Status:  Discontinued        6 mg/kg  108.5 kg (Adjusted) 126 mL/hr over 30 Minutes Intravenous Daily 12/27/20 1229 12/27/20 1454   12/27/20 1000  vancomycin (VANCOREADY) IVPB 2000 mg/400 mL  Status:  Discontinued        2,000 mg 200 mL/hr over  120 Minutes Intravenous Every 12 hours 12/27/20 0049 12/27/20 1218   12/27/20 0923  vancomycin (VANCOCIN) 1-5 GM/200ML-% IVPB       Note to Pharmacy: Ramond Craver   : cabinet override      12/27/20 0923 12/27/20 2129   12/27/20 0923  piperacillin-tazobactam (ZOSYN) 3.375 GM/50ML IVPB       Note to Pharmacy: Ramond Craver   : cabinet override      12/27/20 0923 12/27/20 0928   12/27/20 0200  piperacillin-tazobactam (ZOSYN) IVPB 3.375 g  Status:  Discontinued        3.375 g 12.5 mL/hr over 240 Minutes Intravenous Every 8 hours 12/27/20 0049 12/29/20 1234   12/26/20 2000  vancomycin (VANCOREADY) IVPB 2000 mg/400 mL        2,000 mg 200 mL/hr over 120 Minutes Intravenous Once 12/26/20 1954 12/27/20 0136   12/26/20 1945  piperacillin-tazobactam (ZOSYN) IVPB 3.375 g        3.375 g 100 mL/hr over 30 Minutes Intravenous  Once 12/26/20 1930 12/27/20 0136   12/26/20 1945  vancomycin (VANCOCIN) 2,000 mg in sodium  chloride 0.9 % 500 mL IVPB  Status:  Discontinued        2,000 mg 250 mL/hr over 120 Minutes Intravenous  Once 12/26/20 1931 12/26/20 1953        Assessment/Plan POD 4, s/p I&D of L thigh abscess, Dr. Fredricka Bonine - dry dressing changes BID to thigh wound and will add hydrotherapy today to help clean it up. 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  Patient is getting at time 2-3 trays per meal. Candidemia - fluconazole per ID, TTE to r/o endocarditis on Monday   FEN - carb mod diet VTE - Lovenox ID - ancef 6/8>>   Morbid obesity DM HTN D/O DVT/PE AKI  LOS: 4 days    Letha Cape, [facility] Surgery 12/31/2020, 11:56 AM Please see Amion for pager number during day hours 7:00am-4:30pm

## 2020-12-31 NOTE — Progress Notes (Signed)
Physical Therapy Wound Evaluation Patient Details  Name: Karen Bennett MRN: [id number] Date of Birth: [birth year]  Today's Date: 12/31/2020 Time: 3606-7703 Time Calculation (min): 64 min  Subjective  Subjective Assessment Subjective: Pt pleasant and requesting to return to bed. pt asking appropriate questions about wound therapy. pt reports she noticed what seemed like a bruise or mark on her Lt thigh last Saturday 12/25/20. She states she covered it with a towel and then a day later it appeared more like a blister and opened up which led her to come to the hospital for assessment. She does not recall hitting her leg against anything but states that would not be unusual for her. Patient and Family Stated Goals: wound to heal and return home Date of Onset: 12/25/20 (pt noticed "bruise" or blister)  Pain Score:    Wound Assessment  Wound / Incision (Open or Dehisced) 12/31/20 Non-pressure wound Thigh Anterior;Distal;Left;Lateral PT HYDROTHERAPY Only (Active)  Wound Image   12/31/20 1500  Dressing Type Gauze (Comment);Abdominal binder 12/31/20 1500  Dressing Changed Changed 12/31/20 1500  Dressing Status Clean;Intact 12/31/20 1500  Dressing Change Frequency Daily 12/31/20 1500  Site / Wound Assessment Red;Yellow;Black;Brown 12/31/20 1500  % Wound base Red or Granulating 35% 12/31/20 1500  % Wound base Yellow/Fibrinous Exudate 50% 12/31/20 1500  % Wound base Black/Eschar 15% 12/31/20 1500  Peri-wound Assessment Intact;Erythema (non-blanchable);Induration 12/31/20 1500  Wound Length (cm) 4.1 cm 12/31/20 1500  Wound Width (cm) 7.7 cm 12/31/20 1500  Wound Depth (cm) 5.3 cm 12/31/20 1500  Wound Volume (cm^3) 167.32 cm^3 12/31/20 1500  Wound Surface Area (cm^2) 31.57 cm^2 12/31/20 1500  Tunneling (cm) distal portio of wound has small opening in wound bed that tunnels back and it's depth is 3.1cm 12/31/20 1500  Margins Unattached edges (unapproximated) 12/31/20 1500  Closure None 12/31/20 1500   Drainage Amount Moderate 12/31/20 1500  Drainage Description Sanguineous;No odor 12/31/20 1500  Non-staged Wound Description Full thickness 12/31/20 1500  Treatment Cleansed;Debridement (Selective);Hydrotherapy (Pulse lavage);Packing (Dry gauze) 12/31/20 1500      Hydrotherapy Pulsed lavage therapy - wound location: Lt thigh wound Pulsed Lavage with Suction (psi): 10 psi Pulsed Lavage with Suction - Normal Saline Used: 1000 mL Pulsed Lavage Tip: Tip with splash shield Selective Debridement Selective Debridement - Location: Lt thigh wound Selective Debridement - Tools Used: Forceps, Scissors Selective Debridement - Tissue Removed: devitalized/necrotic tissue and slough    Wound Assessment and Plan  Wound Therapy - Assess/Plan/Recommendations Wound Therapy - Clinical Statement: Patient presented to [facility] for eep tissue infection and candidemia with 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 Patient evaluated today for Pt hydrotherapy. Wound has moderate sangionous drainage with no maceration of periwound noted and no odor. Errythema appears to have decreased based on leg markings and minimal induration is noted around periwound. Wound cleansed with pulsed lavage and significant amount of slough easily removed with sharps debridement. Dressing change ordered for dry to dry per surgical consult. Will conitnue to assess based on drainage; pt may benefit from silver hydrofiber to manage drainage and reduce wound bioburden to promote healing. Wound Therapy - Functional Problem List: Immobility, decreased skin integrity, impaired stregth and activity tolerance. Factors Delaying/Impairing Wound Healing: Diabetes Mellitus, Immobility (Obesity) Hydrotherapy Plan: Debridement, Dressing change, Patient/family education, Pulsatile lavage with suction Wound Therapy - Frequency: 6X / week Wound Therapy - Current Recommendations: PT Wound Therapy - Follow Up Recommendations: f/u pulsed lavage with  suction, f/u selective debridement, dressing changes by RN, dressing changes by family/patient (pt hopes  to dc home with assist from friend for dressing changes- will need to complete pt/family ed.)  Wound Therapy Goals- Improve the function of patient's integumentary system by progressing the wound(s) through the phases of wound healing (inflammation - proliferation - remodeling) by: Wound Therapy Goals - Improve the function of patient's integumentary system by progressing the wound(s) through the phases of wound healing by: Decrease Necrotic Tissue to: 25% Decrease Necrotic Tissue - Progress: Goal set today Increase Granulation Tissue to: 75% Increase Granulation Tissue - Progress: Goal set today Improve Drainage Characteristics: Min, Serous Improve Drainage Characteristics - Progress: Goal set today Patient/Family will be able to : complete dressing change with min assist from friend as needed. Patient/Family Instruction Goal - Progress: Goal set today Goals/treatment plan/discharge plan were made with and agreed upon by patient/family: Yes Time For Goal Achievement: 2 weeks Wound Therapy - Potential for Goals: Fair  Goals will be updated until maximal potential achieved or discharge criteria met.  Discharge criteria: when goals achieved, discharge from hospital, MD decision/surgical intervention, no progress towards goals, refusal/missing three consecutive treatments without notification or medical reason.  GP     Charges PT Wound Care Charges $Wound Debridement up to 20 cm: < or equal to 20 cm $PT Hydrotherapy Dressing: 1 dressing $PT PLS Gun and Tip: 1 Supply $PT Hydrotherapy Visit: 1 Visit       Verner Mould, DPT Acute Rehabilitation Services Office [phone] Pager [phone]

## 2021-01-01 LAB — AEROBIC/ANAEROBIC CULTURE W GRAM STAIN (SURGICAL/DEEP WOUND)

## 2021-01-01 LAB — GLUCOSE, CAPILLARY
Glucose-Capillary: 160 mg/dL — ABNORMAL HIGH (ref 70–99)
Glucose-Capillary: 160 mg/dL — ABNORMAL HIGH (ref 70–99)
Glucose-Capillary: 97 mg/dL (ref 70–99)
Glucose-Capillary: 158 mg/dL — ABNORMAL HIGH (ref 70–99)

## 2021-01-01 LAB — CULTURE, BLOOD (ROUTINE X 2)
Culture: NO GROWTH
Special Requests: ADEQUATE

## 2021-01-01 NOTE — Progress Notes (Signed)
[facility] PROGRESS NOTE    Karen Bennett  AYT:[id number] DOB: [birth year] DOA: 12/26/2020 PCP: Pcp, No    Brief Narrative:  Morbidly obese 45 year old female history of type 2 diabetes mellitus, prior history of DVT/PE, right BKA, super morbid obesity, BMI of 68 presented to the ED with left lower extremity pain rash and blistering .  -Noted to have necrotizing fasciitis this was confirmed on imaging  -Started on broad-spectrum antibiotics -Surgery was consulted and underwent incision and drainage, debridement left thigh soft tissue infection on 12/27/20.    Assessment & Plan:   Left lateral thigh abscess -Sp I&D 6/6 by Dr. Doylene Canard -Wound culture with Klebsiella -IV Zosyn changed to Ancef based on cultures, day 6 of antibiotics -Discussed short-term rehab for wound care, she is adamantly opposed to this, will need home health RN supplemented by family/friend assistance to do dressing changes  Candida albicans fungemia -Has extensive body folds with MASD and cutaneous Candida infection -Currently on IV fluconazole day 4 -OR cultures from thigh abscess negative for yeast -Appreciate infectious disease input, TEE recommended, discussed with cardiology, do not have any openings till Monday  -Repeat blood cultures are negative so far  Uncontrolled hyperglycemia, T2DM. Morbid obesity, BMI is 65 -Hemoglobin A1c is 15.5 -CBGs more stable, continue current dose of Lantus and Premeal NovoLog  -Needs to lose tremendous amount of weight -uses a motorized wheelchair  Right BKA  Hypertension -Stable, lisinopril on hold  Stage II right buttock pressure ulcer present on admission  Hx of DVT and PE.  -She had not been taking rivaroxaban as outpatient, this was resumed by my partner on admission  -Remains at high risk for VTE   AKI, hyponatremia.  -Resolved, lisinopril on hold  DVT prophylaxis: rivaroxaban   Code Status:   full  Family Communication:  No family at the bedside    Remains  inpatient appropriate because:Inpatient level of care appropriate due to severity of illness   Dispo: The patient is from: Home              Anticipated d/c is to: Home likely Tuesday              Patient currently is not medically stable to d/c.   Difficult to place patient No      Skin Documentation: Pressure Injury 12/27/20 Buttocks Right;Left Stage 2 -  Partial thickness loss of dermis presenting as a shallow open injury with a red, pink wound bed without slough. (Active)  12/27/20 1200  Location: Buttocks  Location Orientation: Right;Left  Staging: Stage 2 -  Partial thickness loss of dermis presenting as a shallow open injury with a red, pink wound bed without slough.  Wound Description (Comments):   Present on Admission:      Consultants:  Surgery  Infectious disease  Procedures:  I&D left thigh    Subjective: -Feels okay, denies any specific complaints, adamantly declines SNF for wound care  Objective: Vitals:   12/31/20 0614 12/31/20 1247 12/31/20 2209 01/01/21 0518  BP: (!) 144/78 (!) 149/87 133/67 138/67  Pulse: 66 62 64 63  Resp: 18 18 18 16   Temp: 98 F (36.7 C) 98 F (36.7 C) 97.9 F (36.6 C) 97.8 F (36.6 C)  TempSrc: Oral Oral Oral Oral  SpO2: 96% 97% 97% 98%  Weight:      Height:        Intake/Output Summary (Last 24 hours) at 01/01/2021 1203 Last data filed at 01/01/2021 1022 Gross per 24 hour  Intake  1068.69 ml  Output 3900 ml  Net -2831.31 ml   Filed Weights   12/26/20 1843 12/27/20 0716  Weight: (!) 182.3 kg (!) 182.3 kg    Examination:   General: Obese chronically ill female, laying in bed, AAOx3, no distress HEENT: Neck obese and, no icterus CVS: S1-S2, regular rate rhythm: Distant breath sounds Abdomen: Obese, soft, nontender, large abdominal wall pannus, bowel sounds present  Extremities, left lateral thigh with dressing, right BKA, 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 Skin: As above   Data Reviewed: I have personally  reviewed following labs and imaging studies  CBC: Recent Labs  Lab 12/26/20 2035 12/27/20 0315 12/28/20 0303 12/29/20 0451 12/30/20 0453 12/31/20 0439  WBC 6.9 6.1 7.5 5.7 5.5 5.6  NEUTROABS 4.8 3.9 5.5 3.4  --   --   HGB 12.9 12.1 12.9 11.4* 11.9* 11.5*  HCT 38.3 36.1 38.8 35.5* 37.7 36.6  MCV 86.7 85.7 86.8 91.5 91.3 92.7  PLT 263 251 243 285 303 296   Basic Metabolic Panel: Recent Labs  Lab 12/27/20 1100 12/28/20 0303 12/29/20 0451 12/30/20 0453 12/31/20 0439  NA 127* 127* 131* 130* 135  K 3.7 4.6 3.9 3.9 4.2  CL 95* 97* 100 100 102  CO2 24 19* 24 22 27   GLUCOSE 209* 423* 265* 240* 175*  BUN 21* 25* 30* 28* 26*  CREATININE 1.33* 1.37* 1.49* 1.35* 1.12*  CALCIUM 8.5* 8.4* 8.8* 8.5* 8.6*   GFR: Estimated Creatinine Clearance: 110.9 mL/min (A) (by C-G formula based on SCr of 1.12 mg/dL (H)). Liver Function Tests: Recent Labs  Lab 12/26/20 2035 12/30/20 0453  AST 8* 9*  ALT 10 10  ALKPHOS 77 58  BILITOT 0.6 0.4  PROT 7.2 7.0  ALBUMIN 2.2* 2.3*   No results for input(s): LIPASE, AMYLASE in the last 168 hours. No results for input(s): AMMONIA in the last 168 hours. Coagulation Profile: No results for input(s): INR, PROTIME in the last 168 hours. Cardiac Enzymes: Recent Labs  Lab 12/28/20 0303  CKTOTAL 51   BNP (last 3 results) No results for input(s): PROBNP in the last 8760 hours. HbA1C: No results for input(s): HGBA1C in the last 72 hours.  CBG: Recent Labs  Lab 12/31/20 1244 12/31/20 1654 12/31/20 2206 01/01/21 0720 01/01/21 1108  GLUCAP 164* 210* 152* 160* 160*   Lipid Profile: No results for input(s): CHOL, HDL, LDLCALC, TRIG, CHOLHDL, LDLDIRECT in the last 72 hours. Thyroid Function Tests: No results for input(s): TSH, T4TOTAL, FREET4, T3FREE, THYROIDAB in the last 72 hours. Anemia Panel: No results for input(s): VITAMINB12, FOLATE, FERRITIN, TIBC, IRON, RETICCTPCT in the last 72 hours.  Scheduled Meds:  acetaminophen  650 mg Oral  Q6H   buPROPion  300 mg Oral Daily   Chlorhexidine Gluconate Cloth  6 each Topical Daily   insulin aspart  0-15 Units Subcutaneous TID WC   insulin aspart  8 Units Subcutaneous TID WC   insulin glargine  45 Units Subcutaneous BID   multivitamin with minerals  1 tablet Oral Daily   nutrition supplement (JUVEN)  1 packet Oral BID BM   nystatin   Topical TID   pantoprazole  40 mg Oral Daily   Ensure Max Protein  11 oz Oral BID   rivaroxaban  20 mg Oral Q supper   traZODone  25 mg Oral QHS   venlafaxine XR  75 mg Oral Daily   Continuous Infusions:   ceFAZolin (ANCEF) IV 2 g (01/01/21 0529)   fluconazole (DIFLUCAN)  IV 400 mg (01/01/21 0325)     LOS: 5 days    Zannie Cove, MD

## 2021-01-01 NOTE — Progress Notes (Signed)
Progress Note  5 Days Post-Op  Subjective: Patient overall feeling well.  No new complaints. Pain tolerable with PRN oxy, tylenol, and IV dilaudid for breakthrough.    Objective: Vital signs in last 24 hours: Temp:  [97.8 F (36.6 C)-98 F (36.7 C)] 97.8 F (36.6 C) (06/11 0518) Pulse Rate:  [62-64] 63 (06/11 0518) Resp:  [16-18] 16 (06/11 0518) BP: (133-149)/(67-87) 138/67 (06/11 0518) SpO2:  [97 %-98 %] 98 % (06/11 0518) Last BM Date: 12/29/20  Intake/Output from previous day: 06/10 0701 - 06/11 0700 In: 1068.7 [P.O.:580; IV Piggyback:488.7] Out: 3500 [Urine:3500] Intake/Output this shift: Total I/O In: -  Out: 300 [Urine:300]  PE: Ext: LLE wound with no residual erythema, still with tenderness as expected.  Wound without significant drainage this AM and packed well.  Lab Results:  Recent Labs    12/30/20 0453 12/31/20 0439  WBC 5.5 5.6  HGB 11.9* 11.5*  HCT 37.7 36.6  PLT 303 296   BMET Recent Labs    12/30/20 0453 12/31/20 0439  NA 130* 135  K 3.9 4.2  CL 100 102  CO2 22 27  GLUCOSE 240* 175*  BUN 28* 26*  CREATININE 1.35* 1.12*  CALCIUM 8.5* 8.6*   PT/INR No results for input(s): LABPROT, INR in the last 72 hours. CMP     Component Value Date/Time   NA 135 12/31/2020 0439   K 4.2 12/31/2020 0439   CL 102 12/31/2020 0439   CO2 27 12/31/2020 0439   GLUCOSE 175 (H) 12/31/2020 0439   BUN 26 (H) 12/31/2020 0439   CREATININE 1.12 (H) 12/31/2020 0439   CREATININE 0.70 09/01/2013 0954   CALCIUM 8.6 (L) 12/31/2020 0439   PROT 7.0 12/30/2020 0453   ALBUMIN 2.3 (L) 12/30/2020 0453   AST 9 (L) 12/30/2020 0453   ALT 10 12/30/2020 0453   ALKPHOS 58 12/30/2020 0453   BILITOT 0.4 12/30/2020 0453   GFRNONAA >60 12/31/2020 0439   GFRNONAA >89 09/01/2013 0954   GFRAA >89 09/01/2013 0954   Lipase     Component Value Date/Time   LIPASE 20 09/05/2011 0120       Studies/Results: No results found.  Anti-infectives: Anti-infectives (From  admission, onward)    Start     Dose/Rate Route Frequency Ordered Stop   12/30/20 0400  fluconazole (DIFLUCAN) IVPB 400 mg        400 mg 100 mL/hr over 120 Minutes Intravenous Every 24 hours 12/29/20 0314     12/29/20 1800  cefTRIAXone (ROCEPHIN) 2 g in sodium chloride 0.9 % 100 mL IVPB  Status:  Discontinued        2 g 200 mL/hr over 30 Minutes Intravenous Every 24 hours 12/29/20 1234 12/29/20 1438   12/29/20 1530  ceFAZolin (ANCEF) IVPB 2g/100 mL premix        2 g 200 mL/hr over 30 Minutes Intravenous Every 8 hours 12/29/20 1438     12/29/20 0400  fluconazole (DIFLUCAN) IVPB 400 mg        400 mg 100 mL/hr over 120 Minutes Intravenous Every 2 hours 12/29/20 0314 12/29/20 0754   12/27/20 2000  DAPTOmycin (CUBICIN) 650 mg in sodium chloride 0.9 % IVPB  Status:  Discontinued        6 mg/kg  108.5 kg (Adjusted) 126 mL/hr over 30 Minutes Intravenous Daily 12/27/20 1229 12/27/20 1454   12/27/20 1000  vancomycin (VANCOREADY) IVPB 2000 mg/400 mL  Status:  Discontinued        2,000 mg 200  mL/hr over 120 Minutes Intravenous Every 12 hours 12/27/20 0049 12/27/20 1218   12/27/20 0923  vancomycin (VANCOCIN) 1-5 GM/200ML-% IVPB       Note to Pharmacy: Ramond Craver   : cabinet override      12/27/20 0923 12/27/20 2129   12/27/20 0923  piperacillin-tazobactam (ZOSYN) 3.375 GM/50ML IVPB       Note to Pharmacy: Ramond Craver   : cabinet override      12/27/20 0923 12/27/20 0928   12/27/20 0200  piperacillin-tazobactam (ZOSYN) IVPB 3.375 g  Status:  Discontinued        3.375 g 12.5 mL/hr over 240 Minutes Intravenous Every 8 hours 12/27/20 0049 12/29/20 1234   12/26/20 2000  vancomycin (VANCOREADY) IVPB 2000 mg/400 mL        2,000 mg 200 mL/hr over 120 Minutes Intravenous Once 12/26/20 1954 12/27/20 0136   12/26/20 1945  piperacillin-tazobactam (ZOSYN) IVPB 3.375 g        3.375 g 100 mL/hr over 30 Minutes Intravenous  Once 12/26/20 1930 12/27/20 0136   12/26/20 1945  vancomycin (VANCOCIN)  2,000 mg in sodium chloride 0.9 % 500 mL IVPB  Status:  Discontinued        2,000 mg 250 mL/hr over 120 Minutes Intravenous  Once 12/26/20 1931 12/26/20 1953        Assessment/Plan POD 5, s/p I&D of L thigh abscess, Dr. Fredricka Bonine - dry dressing changes BID to thigh wound, hydrotherapy  - cx with klebsiella resistant to ampicillin - ID recommending ancef - mobilize as able, has BKA on R side - continue efforts for glycemic control to aid in wound healing.  Patient is getting at time 2-3 trays per meal.  Candidemia - fluconazole per ID, TTE to r/o endocarditis on Monday   FEN - carb mod diet VTE - Lovenox ID - ancef 6/8>>   Morbid obesity DM HTN D/O DVT/PE AKI   LOS: 5 days   Maudry Diego, MD FACS Surgical Oncology, General Surgery, Trauma and [facility], [place] [phone] for weekday/non holidays Check [email or web address] for coverage night/weekend/holidays  Do not use SecureChat as it is not reliable for timely patient care.

## 2021-01-01 NOTE — Progress Notes (Signed)
PT WOUND-HYDROTHERAPY TX NOTE  Patient Details  Name: Karen Bennett MRN: [id number] Date of Birth: [birth year]   Today's Date:01/01/21 Time:1115 Time Calculation (min): 64 min   Subjective  Subjective Assessment Subjective:pt reports she noticed what seemed like a bruise or mark on her Lt thigh last Saturday 12/25/20. She states she covered it with a towel and then a day later it appeared more like a blister and opened up which led her to come to the hospital for assessment. She does not recall hitting her leg against anything but states that would not be unusual for her. Patient and Family Stated Goals: wound to heal and return home Date of Onset: 12/25/20 (pt noticed "bruise" or blister)  Pain Score:  5/10 during tx, 0-10 after tx. Pt was premedicated    Wound Assessment     Wound / Incision (Open or Dehisced) 12/31/20 Non-pressure wound Thigh Anterior;Distal;Left;Lateral PT HYDROTHERAPY Only (Active)  Wound Image    Dressing Type Gauze (Comment); kerlix, hypafix tape   Dressing Changed Changed  Dressing Status Clean;Intact  Dressing Change Frequency Daily  Site / Wound Assessment Red;Yellow;Black;Brown  % Wound base Red or Granulating 35%  % Wound base Yellow/Fibrinous Exudate 50%  % Wound base Black/Eschar 15%  Peri-wound Assessment Intact;Erythema (non-blanchable);Induration  Wound Length (cm) 4.1 cm--per eval   Wound Width (cm) 7.7 cm  Wound Depth (cm) 5.3 cm  Wound Volume (cm^3) 167.32 cm^3  Wound Surface Area (cm^2) 31.57 cm^2  Tunneling (cm) distal portion of wound has small opening in wound bed that tunnels back and it's depth is 3.1cm  Margins Unattached edges (unapproximated)  Closure None  Drainage Amount Moderate  Drainage Description Sanguineous;No odor  Non-staged Wound Description Full thickness  Treatment Cleansed;Debridement (Selective);Hydrotherapy (Pulse lavage);Packing (Dry gauze)       Hydrotherapy Pulsed lavage therapy - wound location: Lt thigh  wound Pulsed Lavage with Suction (psi): 10 psi Pulsed Lavage with Suction - Normal Saline Used: 1000 mL Pulsed Lavage Tip: Tip with splash shield Selective Debridement Selective Debridement - Location: Lt thigh wound Selective Debridement - Tools Used: Forceps, Scissors Selective Debridement - Tissue Removed: devitalized/necrotic tissue and slough      Wound Assessment and Plan  Wound Therapy - Assess/Plan/Recommendations Wound Therapy - Clinical Statement: Patient presented to [facility] for deep tissue infection and candidemia with 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 Patient seen today for Pt hydrotherapy tx. Wound has moderate serosanguinous drainage with no maceration of periwound noted and no odor. Errythema appears further to near absent  based on leg markings and minimal induration is noted around periwound. Wound cleansed with pulsed lavage and small  amount of slough easily removed with sharps debridement. Dressing change ordered for dry to dry per surgical consult. Will conitnue to assess based on drainage--today the superior margins of wound appear dry therefore began packing with dry gauze and transitioned to lightly dampened gauze over dry wound area; pt may benefit from silver hydrofiber to manage drainage and reduce wound bioburden to promote healing, will continue to asssess, pt with decr exudate today  Wound Therapy - Functional Problem List: Immobility, decreased skin integrity, impaired stregth and activity tolerance. Factors Delaying/Impairing Wound Healing: Diabetes Mellitus, Immobility (Obesity) Hydrotherapy Plan: Debridement, Dressing change, Patient/family education, Pulsatile lavage with suction Wound Therapy - Frequency: 6X / week Wound Therapy - Current Recommendations: PT Wound Therapy - Follow Up Recommendations: f/u pulsed lavage with suction, f/u selective debridement, dressing changes by RN, dressing changes by family/patient (pt hopes to dc  home with assist  from friend for dressing changes- will need to complete pt/family ed.)

## 2021-01-02 LAB — GLUCOSE, CAPILLARY
Glucose-Capillary: 145 mg/dL — ABNORMAL HIGH (ref 70–99)
Glucose-Capillary: 157 mg/dL — ABNORMAL HIGH (ref 70–99)
Glucose-Capillary: 137 mg/dL — ABNORMAL HIGH (ref 70–99)
Glucose-Capillary: 150 mg/dL — ABNORMAL HIGH (ref 70–99)

## 2021-01-02 NOTE — Progress Notes (Signed)
Lungs PROGRESS NOTE    Karen Bennett  NUU:[id number] DOB: 1975/11/22 DOA: 12/26/2020 PCP: Pcp, No    Brief Narrative:  Morbidly obese 45 year old female history of type 2 diabetes mellitus, prior history of DVT/PE, right BKA, super morbid obesity, BMI of 68 presented to the ED with left lower extremity pain rash and blistering .  -Noted to have necrotizing fasciitis this was confirmed on imaging  -Started on broad-spectrum antibiotics -Surgery was consulted and underwent incision and drainage, debridement left thigh soft tissue infection on 12/27/20.    Assessment & Plan:   Left lateral thigh abscess -Sp I&D 6/6 by Dr. Doylene Canard -Wound culture with Klebsiella -Remains on IV Ancef now, day 7 of antibiotics, will transition to p.o. at discharge  -Discussed short-term rehab for wound care, she is adamantly opposed to this, will need home health RN supplemented by family/friend assistance to do dressing changes  Candida albicans fungemia -Has extensive body folds with MASD and cutaneous Candida infection -Currently on IV fluconazole day 5 -OR cultures from thigh abscess negative for yeast -Appreciate infectious disease input, TEE recommended, this is set up with cardiology tomorrow at [facility] -Repeat blood cultures are negative so far  Uncontrolled hyperglycemia, T2DM. Morbid obesity, BMI is 65 -Hemoglobin A1c is 15.5 -CBGs more stable, continue current dose of Lantus and Premeal NovoLog  -Needs to lose tremendous amount of weight -uses a motorized wheelchair  Right BKA  Hypertension -Stable, lisinopril on hold  Stage II right buttock pressure ulcer present on admission  Hx of DVT and PE.  -She had not been taking rivaroxaban as outpatient, this was resumed by my partner on admission  -Remains at high risk for VTE   AKI, hyponatremia.  -Resolved, lisinopril on hold  DVT prophylaxis: rivaroxaban   Code Status:   full  Family Communication:  No family at the bedside     Remains inpatient appropriate because:Inpatient level of care appropriate due to severity of illness   Dispo: The patient is from: Home              Anticipated d/c is to: Home likely Tuesday              Patient currently is not medically stable to d/c.   Difficult to place patient No      Skin Documentation: Pressure Injury 12/27/20 Buttocks Right;Left Stage 2 -  Partial thickness loss of dermis presenting as a shallow open injury with a red, pink wound bed without slough. (Active)  12/27/20 1200  Location: Buttocks  Location Orientation: Right;Left  Staging: Stage 2 -  Partial thickness loss of dermis presenting as a shallow open injury with a red, pink wound bed without slough.  Wound Description (Comments):   Present on Admission:      Consultants:  Surgery  Infectious disease  Procedures:  I&D left thigh    Subjective: -Feels okay overall, denies any specific complaints, discussed need to cut back on sodas  Objective: Vitals:   01/01/21 0518 01/01/21 1317 01/01/21 2106 01/02/21 0552  BP: 138/67 123/70 126/68 (!) 149/77  Pulse: 63 62 63 (!) 59  Resp: 16 18 18 18   Temp: 97.8 F (36.6 C) 98 F (36.7 C) 98.2 F (36.8 C) 98.2 F (36.8 C)  TempSrc: Oral Oral Oral Oral  SpO2: 98% 95% 95% 96%  Weight:      Height:        Intake/Output Summary (Last 24 hours) at 01/02/2021 1048 Last data filed at 01/02/2021 1000 Gross per  24 hour  Intake 740 ml  Output 3500 ml  Net -2760 ml   Filed Weights   12/26/20 1843 12/27/20 0716  Weight: (!) 182.3 kg (!) 182.3 kg    Examination:   General: Obese pleasant female laying in bed, AAOx3, no distress CVS: S1-S2, regular rate rhythm Lungs: Distant breath sounds Abdomen: Soft, obese, nontender, large abdominal wall pannus, bowel sounds present Extremities: Left lateral thigh with dressing, right BKA, previous amputation of 2 toes left foot  Skin: As above   Data Reviewed: I have personally reviewed following labs  and imaging studies  CBC: Recent Labs  Lab 12/26/20 2035 12/27/20 0315 12/28/20 0303 12/29/20 0451 12/30/20 0453 12/31/20 0439  WBC 6.9 6.1 7.5 5.7 5.5 5.6  NEUTROABS 4.8 3.9 5.5 3.4  --   --   HGB 12.9 12.1 12.9 11.4* 11.9* 11.5*  HCT 38.3 36.1 38.8 35.5* 37.7 36.6  MCV 86.7 85.7 86.8 91.5 91.3 92.7  PLT 263 251 243 285 303 296   Basic Metabolic Panel: Recent Labs  Lab 12/27/20 1100 12/28/20 0303 12/29/20 0451 12/30/20 0453 12/31/20 0439  NA 127* 127* 131* 130* 135  K 3.7 4.6 3.9 3.9 4.2  CL 95* 97* 100 100 102  CO2 24 19* 24 22 27   GLUCOSE 209* 423* 265* 240* 175*  BUN 21* 25* 30* 28* 26*  CREATININE 1.33* 1.37* 1.49* 1.35* 1.12*  CALCIUM 8.5* 8.4* 8.8* 8.5* 8.6*   GFR: Estimated Creatinine Clearance: 110.9 mL/min (A) (by C-G formula based on SCr of 1.12 mg/dL (H)). Liver Function Tests: Recent Labs  Lab 12/26/20 2035 12/30/20 0453  AST 8* 9*  ALT 10 10  ALKPHOS 77 58  BILITOT 0.6 0.4  PROT 7.2 7.0  ALBUMIN 2.2* 2.3*   No results for input(s): LIPASE, AMYLASE in the last 168 hours. No results for input(s): AMMONIA in the last 168 hours. Coagulation Profile: No results for input(s): INR, PROTIME in the last 168 hours. Cardiac Enzymes: Recent Labs  Lab 12/28/20 0303  CKTOTAL 51   BNP (last 3 results) No results for input(s): PROBNP in the last 8760 hours. HbA1C: No results for input(s): HGBA1C in the last 72 hours.  CBG: Recent Labs  Lab 01/01/21 0720 01/01/21 1108 01/01/21 1638 01/01/21 2110 01/02/21 0725  GLUCAP 160* 160* 97 158* 145*   Lipid Profile: No results for input(s): CHOL, HDL, LDLCALC, TRIG, CHOLHDL, LDLDIRECT in the last 72 hours. Thyroid Function Tests: No results for input(s): TSH, T4TOTAL, FREET4, T3FREE, THYROIDAB in the last 72 hours. Anemia Panel: No results for input(s): VITAMINB12, FOLATE, FERRITIN, TIBC, IRON, RETICCTPCT in the last 72 hours.  Scheduled Meds:  acetaminophen  650 mg Oral Q6H   buPROPion  300 mg  Oral Daily   Chlorhexidine Gluconate Cloth  6 each Topical Daily   insulin aspart  0-15 Units Subcutaneous TID WC   insulin aspart  8 Units Subcutaneous TID WC   insulin glargine  45 Units Subcutaneous BID   multivitamin with minerals  1 tablet Oral Daily   nutrition supplement (JUVEN)  1 packet Oral BID BM   nystatin   Topical TID   pantoprazole  40 mg Oral Daily   Ensure Max Protein  11 oz Oral BID   rivaroxaban  20 mg Oral Q supper   traZODone  25 mg Oral QHS   venlafaxine XR  75 mg Oral Daily   Continuous Infusions:   ceFAZolin (ANCEF) IV 2 g (01/02/21 0738)   fluconazole (DIFLUCAN) IV 400 mg (  01/02/21 0457)     LOS: 6 days    Zannie Cove, MD

## 2021-01-03 ENCOUNTER — Inpatient Hospital Stay (HOSPITAL_COMMUNITY): Payer: Medicare Other

## 2021-01-03 ENCOUNTER — Encounter (HOSPITAL_COMMUNITY)
Admission: EM | Disposition: A | Discharge status: Home Health | Payer: Self-pay | Source: Home / Self Care | Attending: Internal Medicine

## 2021-01-03 ENCOUNTER — Inpatient Hospital Stay (HOSPITAL_COMMUNITY): Payer: Medicare Other | Admitting: Anesthesiology

## 2021-01-03 ENCOUNTER — Encounter (HOSPITAL_COMMUNITY): Payer: Self-pay | Admitting: Family Medicine

## 2021-01-03 ENCOUNTER — Inpatient Hospital Stay: Payer: Self-pay

## 2021-01-03 DIAGNOSIS — I340 Nonrheumatic mitral (valve) insufficiency: Secondary | ICD-10-CM

## 2021-01-03 DIAGNOSIS — I361 Nonrheumatic tricuspid (valve) insufficiency: Secondary | ICD-10-CM

## 2021-01-03 DIAGNOSIS — I330 Acute and subacute infective endocarditis: Secondary | ICD-10-CM | POA: Diagnosis present

## 2021-01-03 HISTORY — PX: TEE WITHOUT CARDIOVERSION: SHX5443

## 2021-01-03 LAB — BASIC METABOLIC PANEL
Anion gap: 4 — ABNORMAL LOW (ref 5–15)
BUN: 30 mg/dL — ABNORMAL HIGH (ref 6–20)
CO2: 29 mmol/L (ref 22–32)
Calcium: 8.9 mg/dL (ref 8.9–10.3)
Chloride: 103 mmol/L (ref 98–111)
Creatinine, Ser: 1.12 mg/dL — ABNORMAL HIGH (ref 0.44–1.00)
GFR, Estimated: >60 mL/min (ref >60)
Glucose, Bld: 145 mg/dL — ABNORMAL HIGH (ref 70–99)
Potassium: 4.8 mmol/L (ref 3.5–5.1)
Sodium: 136 mmol/L (ref 135–145)

## 2021-01-03 LAB — ANTIFUNGAL AST 9 DRUG PANEL
Amphotericin B MIC: 0.5
Fluconazole Islt MIC: 0.5
Flucytosine MIC: 0.5
Itraconazole MIC: 0.06
Posaconazole MIC: 0.03
Source: 183119

## 2021-01-03 LAB — CBC
HCT: 37.4 % (ref 36.0–46.0)
Hemoglobin: 11.4 g/dL — ABNORMAL LOW (ref 12.0–15.0)
MCH: 28.6 pg (ref 26.0–34.0)
MCHC: 30.5 g/dL (ref 30.0–36.0)
MCV: 94 fL (ref 80.0–100.0)
Platelets: 319 K/uL (ref 150–400)
RBC: 3.98 MIL/uL (ref 3.87–5.11)
RDW: 13.9 % (ref 11.5–15.5)
WBC: 5.8 K/uL (ref 4.0–10.5)
nRBC: 0 % (ref 0.0–0.2)

## 2021-01-03 LAB — GLUCOSE, CAPILLARY
Glucose-Capillary: 147 mg/dL — ABNORMAL HIGH (ref 70–99)
Glucose-Capillary: 108 mg/dL — ABNORMAL HIGH (ref 70–99)
Glucose-Capillary: 99 mg/dL (ref 70–99)
Glucose-Capillary: 146 mg/dL — ABNORMAL HIGH (ref 70–99)

## 2021-01-03 SURGERY — ECHOCARDIOGRAM, TRANSESOPHAGEAL
Anesthesia: Monitor Anesthesia Care

## 2021-01-03 MED ORDER — SODIUM CHLORIDE 0.9 % IV SOLN
200.0000 mg | INTRAVENOUS | Status: DC
  Administered 2021-01-03 – 2021-01-05 (×3): 200 mg via INTRAVENOUS
  Filled 2021-01-03 (×3): qty 200

## 2021-01-03 MED ORDER — CEPHALEXIN 500 MG PO CAPS
500.0000 mg | ORAL_CAPSULE | Freq: Four times a day (QID) | ORAL | Status: DC
  Administered 2021-01-04 – 2021-01-05 (×9): 500 mg via ORAL
  Filled 2021-01-03 (×9): qty 1

## 2021-01-03 MED ORDER — PROPOFOL 10 MG/ML IV BOLUS
INTRAVENOUS | Status: DC | PRN
  Administered 2021-01-03 (×3): 20 mg via INTRAVENOUS

## 2021-01-03 MED ORDER — MIDAZOLAM HCL 2 MG/2ML IJ SOLN
INTRAMUSCULAR | Status: DC | PRN
  Administered 2021-01-03: 2 mg via INTRAVENOUS

## 2021-01-03 MED ORDER — PROPOFOL 500 MG/50ML IV EMUL
INTRAVENOUS | Status: DC | PRN
  Administered 2021-01-03: 90 ug/kg/min via INTRAVENOUS
  Administered 2021-01-03: 100 ug/kg/min via INTRAVENOUS

## 2021-01-03 MED ORDER — SODIUM CHLORIDE 0.9% FLUSH
10.0000 mL | Freq: Two times a day (BID) | INTRAVENOUS | Status: DC
  Administered 2021-01-05: 10 mL

## 2021-01-03 MED ORDER — SODIUM CHLORIDE 0.9% FLUSH: 10.0000 mL | INTRAVENOUS | Status: DC | PRN

## 2021-01-03 MED ORDER — SODIUM CHLORIDE 0.9 % IV SOLN: INTRAVENOUS | Status: DC

## 2021-01-03 MED ORDER — LIDOCAINE 2% (20 MG/ML) 5 ML SYRINGE
INTRAMUSCULAR | Status: DC | PRN
  Administered 2021-01-03: 60 mg via INTRAVENOUS

## 2021-01-03 NOTE — Interval H&P Note (Signed)
History and Physical Interval Note:  01/03/2021 11:21 AM  Karen Bennett  has presented today for surgery, with the diagnosis of BACTERIMIA.  The various methods of treatment have been discussed with the patient and family. After consideration of risks, benefits and other options for treatment, the patient has consented to  Procedure(s): TRANSESOPHAGEAL ECHOCARDIOGRAM (TEE) (N/A) as a surgical intervention.  The patient's history has been reviewed, patient examined, no change in status, stable for surgery.  I have reviewed the patient's chart and labs.  Questions were answered to the patient's satisfaction.     [name]

## 2021-01-03 NOTE — Anesthesia Preprocedure Evaluation (Signed)
Anesthesia Evaluation  Patient identified by MRN, date of birth, ID band Patient awake    Reviewed: Allergy & Precautions, NPO status , Patient's Chart, lab work & pertinent test results  History of Anesthesia Complications Negative for: history of anesthetic complications  Airway Mallampati: II  TM Distance: >3 FB Neck ROM: Full    Dental  (+) Chipped, Dental Advisory Given   Pulmonary Current Smoker and Patient abstained from smoking.,    breath sounds clear to auscultation       Cardiovascular hypertension, Pt. on medications (-) angina+ Peripheral Vascular Disease   Rhythm:Regular Rate:Normal     Neuro/Psych PSYCHIATRIC DISORDERS Anxiety Depression Peripheral neuropathy    GI/Hepatic Neg liver ROS, GERD  Medicated and Controlled,nausea and vomiting with this cellulitis infection   Endo/Other  diabetes, Poorly Controlled, Type 2, Insulin Dependent, Oral Hypoglycemic AgentsMorbid obesity  Renal/GU Renal InsufficiencyRenal disease     Musculoskeletal   Abdominal (+) + obese,   Peds  Hematology  (+) Blood dyscrasia, anemia , xarelto   Anesthesia Other Findings   Reproductive/Obstetrics                             Anesthesia Physical  Anesthesia Plan  ASA: 3  Anesthesia Plan: MAC   Post-op Pain Management:    Induction: Intravenous  PONV Risk Score and Plan: 3 and Ondansetron, Dexamethasone and Scopolamine patch - Pre-op  Airway Management Planned: Natural Airway, Mask and Nasal Cannula  Additional Equipment: None  Intra-op Plan:   Post-operative Plan:   Informed Consent: I have reviewed the patients History and Physical, chart, labs and discussed the procedure including the risks, benefits and alternatives for the proposed anesthesia with the patient or authorized representative who has indicated his/her understanding and acceptance.     Dental advisory given  Plan  Discussed with: CRNA and Anesthesiologist  Anesthesia Plan Comments:         Anesthesia Quick Evaluation

## 2021-01-03 NOTE — Progress Notes (Signed)
[facility]    Date of Admission:  12/26/2020   Total days of antibiotics 8           ID: Karen Bennett is a 45 y.o. female with  necrotizing fasciitis of left thigh but also found to have fungemia with no apparent source/risk factors Principal Problem:   Necrotizing fasciitis of lower leg (HCC) Thigh  Active Problems:   DM (diabetes mellitus), type 2, uncontrolled with complications (HCC)   Anxiety and depression   Cellulitis   BMI 60.0-69.9, adult (HCC)   HTN (hypertension)   History of pulmonary embolism   Pressure injury of skin   Infective endocarditis    Subjective: Afebrile. Tolerated TEE- she was found to have a thin-filament like vegetation on anterior leaflet of MV Still has pain with hydrotherapy of left leg wound  Medications:   acetaminophen  650 mg Oral Q6H   buPROPion  300 mg Oral Daily   [START ON 01/04/2021] cephALEXin  500 mg Oral Q6H   Chlorhexidine Gluconate Cloth  6 each Topical Daily   insulin aspart  0-15 Units Subcutaneous TID WC   insulin aspart  8 Units Subcutaneous TID WC   insulin glargine  45 Units Subcutaneous BID   multivitamin with minerals  1 tablet Oral Daily   nutrition supplement (JUVEN)  1 packet Oral BID BM   nystatin   Topical TID   pantoprazole  40 mg Oral Daily   Ensure Max Protein  11 oz Oral BID   rivaroxaban  20 mg Oral Q supper   traZODone  25 mg Oral QHS   venlafaxine XR  75 mg Oral Daily    Objective: Vital signs in last 24 hours: Temp:  [97.8 F (36.6 C)-98.7 F (37.1 C)] 98.7 F (37.1 C) (06/13 1446) Pulse Rate:  [65-80] 69 (06/13 1446) Resp:  [11-18] 18 (06/13 1446) BP: (127-172)/(59-84) 155/77 (06/13 1446) SpO2:  [94 %-98 %] 98 % (06/13 1446) Weight:  [182.3 kg] 182.3 kg (06/13 1133) Physical Exam  Constitutional:  oriented to person, place, and time. appears well-developed and well-nourished. No distress.  HENT: [place]/AT, PERRLA, no scleral icterus Mouth/Throat: Oropharynx is clear  and moist. No oropharyngeal exudate.  Cardiovascular: Normal rate, regular rhythm and normal heart sounds. Exam reveals no gallop and no friction rub.  No murmur heard.  Pulmonary/Chest: Effort normal and breath sounds normal. No respiratory distress.  has no wheezes.  Neck = supple, no nuchal rigidity Abdominal: Soft. Bowel sounds are normal.  exhibits no distension. There is no tenderness.  Lymphadenopathy: no cervical adenopathy. No axillary adenopathy Neurological: alert and oriented to person, place, and time.  Skin: Skin is warm and dry. Deep wound bed with exudate but surrounding erythema and swelling is improved Psychiatric: a normal mood and affect.  behavior is normal.   Lab Results Recent Labs    01/03/21 0433  WBC 5.8  HGB 11.4*  HCT 37.4  NA 136  K 4.8  CL 103  CO2 29  BUN 30*  CREATININE 1.12*     Microbiology: 6/5 blood cx c.albicans 6/6 blood cx ngtd 6/9 blood cx ngtd 6/6 wound cx : klebsiella pneumonaie Studies/Results: No results found.   Assessment/Plan: Candidal native MV endocarditis = plan to switch to anidulafungin to treat for 6 wks followed by fluconazole 400mg  at minimum daily, for life long suppression if no other surgical intervention. We will have ophtho evaluate & rule out  endophthalmitis today. She would not be  a good candidate for ampho B and flucytosine, will plan to do monotherapy with echinocandins  Will need picc line  Klebsiella deep leg wound = will switch to orals with 6 more days of cephalexin. Recommend to do santyl once a day with dressing changes.  [facility] Cell: [phone] Pager: [phone]  01/03/2021, 4:04 PM

## 2021-01-03 NOTE — Progress Notes (Signed)
Lungs PROGRESS NOTE    Karen Bennett  YHC:[id number] DOB: [birth year] DOA: 12/26/2020 PCP: Pcp, No    Brief Narrative:  Morbidly obese 45 year old female history of type 2 diabetes mellitus, prior history of DVT/PE, right BKA, super morbid obesity, BMI of 68 presented to the ED with left lower extremity pain rash and blistering .  -Noted to have necrotizing fasciitis this was confirmed on imaging  -Started on broad-spectrum antibiotics -Surgery was consulted and underwent incision and drainage, debridement left thigh soft tissue infection on 12/27/20.    Assessment & Plan:   Left lateral thigh abscess -Sp I&D 6/6 by Dr. Doylene Canard -Wound culture with Klebsiella -Remains on IV Ancef now, day 8 of antibiotics, will transition to p.o. at discharge  -adamantly declines, will need home health RN supplemented by family/friend assistance to do dressing changes -Close follow-up with [facility]  Candida albicans fungemia -Has extensive body folds with MASD and cutaneous Candida infection -Currently on IV fluconazole day 6 -OR cultures from thigh abscess negative for yeast -Appreciate infectious disease input, TEE recommended, this is set up with cardiology today at [facility] -Repeat blood cultures are negative so far -Antifungal duration per ID  Uncontrolled hyperglycemia, T2DM. Morbid obesity, BMI is 65 -Hemoglobin A1c is 15.5 -CBGs more stable, continue current dose of Lantus and Premeal NovoLog  -Needs to lose tremendous amount of weight -uses a motorized wheelchair  Right BKA  Hypertension -Stable, lisinopril on hold  Stage II right buttock pressure ulcer present on admission  Hx of DVT and PE.  -She had not been taking rivaroxaban as outpatient, this was resumed by my partner on admission  -Remains at high risk for VTE   AKI, hyponatremia.  -Resolved, lisinopril on hold  DVT prophylaxis: rivaroxaban   Code Status:   full  Family Communication:  No family at the bedside    Remains  inpatient appropriate because:Inpatient level of care appropriate due to severity of illness   Dispo: The patient is from: Home              Anticipated d/c is to: Home tomorrow              Patient currently is not medically stable to d/c.   Difficult to place patient No      Skin Documentation: Pressure Injury 12/27/20 Buttocks Right;Left Stage 2 -  Partial thickness loss of dermis presenting as a shallow open injury with a red, pink wound bed without slough. (Active)  12/27/20 1200  Location: Buttocks  Location Orientation: Right;Left  Staging: Stage 2 -  Partial thickness loss of dermis presenting as a shallow open injury with a red, pink wound bed without slough.  Wound Description (Comments):   Present on Admission:      Consultants:  Surgery  Infectious disease  Procedures:  I&D left thigh    Subjective: -N.p.o. for TEE today, denies any other complaints  Objective: Vitals:   01/02/21 0552 01/02/21 1406 01/02/21 2114 01/03/21 0521  BP: (!) 149/77 138/68 127/63 (!) 147/59  Pulse: (!) 59 (!) 58 65 68  Resp: 18 18 17 18   Temp: 98.2 F (36.8 C) 98.1 F (36.7 C) 98.2 F (36.8 C) 97.9 F (36.6 C)  TempSrc: Oral Oral Oral Oral  SpO2: 96% 98% 95% 98%  Weight:      Height:        Intake/Output Summary (Last 24 hours) at 01/03/2021 1035 Last data filed at 01/03/2021 0630 Gross per 24 hour  Intake 820 ml  Output 3300 ml  Net -2480 ml   Filed Weights   12/26/20 1843 12/27/20 0716  Weight: (!) 182.3 kg (!) 182.3 kg    Examination:   General: Morbidly obese pleasant female, laying in bed, AAOx3, no distress CVS: S1-S2, regular rate rhythm Lungs: Distant breath sounds Abdomen: Soft, obese, nontender, large abdominal pannus, bowel sounds present Extremities, left lateral thigh with dressing, right BKA  Skin: As above   Data Reviewed: I have personally reviewed following labs and imaging studies  CBC: Recent Labs  Lab 12/28/20 0303 12/29/20 0451  12/30/20 0453 12/31/20 0439 01/03/21 0433  WBC 7.5 5.7 5.5 5.6 5.8  NEUTROABS 5.5 3.4  --   --   --   HGB 12.9 11.4* 11.9* 11.5* 11.4*  HCT 38.8 35.5* 37.7 36.6 37.4  MCV 86.8 91.5 91.3 92.7 94.0  PLT 243 285 303 296 319   Basic Metabolic Panel: Recent Labs  Lab 12/28/20 0303 12/29/20 0451 12/30/20 0453 12/31/20 0439 01/03/21 0433  NA 127* 131* 130* 135 136  K 4.6 3.9 3.9 4.2 4.8  CL 97* 100 100 102 103  CO2 19* 24 22 27 29   GLUCOSE 423* 265* 240* 175* 145*  BUN 25* 30* 28* 26* 30*  CREATININE 1.37* 1.49* 1.35* 1.12* 1.12*  CALCIUM 8.4* 8.8* 8.5* 8.6* 8.9   GFR: Estimated Creatinine Clearance: 110.9 mL/min (A) (by C-G formula based on SCr of 1.12 mg/dL (H)). Liver Function Tests: Recent Labs  Lab 12/30/20 0453  AST 9*  ALT 10  ALKPHOS 58  BILITOT 0.4  PROT 7.0  ALBUMIN 2.3*   No results for input(s): LIPASE, AMYLASE in the last 168 hours. No results for input(s): AMMONIA in the last 168 hours. Coagulation Profile: No results for input(s): INR, PROTIME in the last 168 hours. Cardiac Enzymes: Recent Labs  Lab 12/28/20 0303  CKTOTAL 51   BNP (last 3 results) No results for input(s): PROBNP in the last 8760 hours. HbA1C: No results for input(s): HGBA1C in the last 72 hours.  CBG: Recent Labs  Lab 01/02/21 0725 01/02/21 1116 01/02/21 1630 01/02/21 2116 01/03/21 0748  GLUCAP 145* 157* 137* 150* 147*   Lipid Profile: No results for input(s): CHOL, HDL, LDLCALC, TRIG, CHOLHDL, LDLDIRECT in the last 72 hours. Thyroid Function Tests: No results for input(s): TSH, T4TOTAL, FREET4, T3FREE, THYROIDAB in the last 72 hours. Anemia Panel: No results for input(s): VITAMINB12, FOLATE, FERRITIN, TIBC, IRON, RETICCTPCT in the last 72 hours.  Scheduled Meds:  acetaminophen  650 mg Oral Q6H   buPROPion  300 mg Oral Daily   Chlorhexidine Gluconate Cloth  6 each Topical Daily   insulin aspart  0-15 Units Subcutaneous TID WC   insulin aspart  8 Units Subcutaneous  TID WC   insulin glargine  45 Units Subcutaneous BID   multivitamin with minerals  1 tablet Oral Daily   nutrition supplement (JUVEN)  1 packet Oral BID BM   nystatin   Topical TID   pantoprazole  40 mg Oral Daily   Ensure Max Protein  11 oz Oral BID   rivaroxaban  20 mg Oral Q supper   traZODone  25 mg Oral QHS   venlafaxine XR  75 mg Oral Daily   Continuous Infusions:   ceFAZolin (ANCEF) IV 2 g (01/03/21 0521)   fluconazole (DIFLUCAN) IV 400 mg (01/03/21 0336)     LOS: 7 days    01/05/21, MD

## 2021-01-03 NOTE — Anesthesia Procedure Notes (Signed)
Procedure Name: MAC Date/Time: 01/03/2021 10:25 AM Performed by: Janace Litten, CRNA Pre-anesthesia Checklist: Patient identified, Emergency Drugs available, Suction available and Patient being monitored Patient Re-evaluated:Patient Re-evaluated prior to induction Oxygen Delivery Method: Nasal cannula

## 2021-01-03 NOTE — Consult Note (Signed)
Ophthalmology HPI: This is a 45 y.o.  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 Ophthalmology was consulted to rule out fungal endophthalmitis. There is no symptoms. Patient denies flashes of light for floaters, eye pain, or decrease in vision. 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       Past Medical History:  Diagnosis Date   Cellulitis    Diabetes mellitus    PE (pulmonary embolism) ~2007-2008   Not on anticoagulation     Past Surgical History:  Procedure Laterality Date   Abscess removal from L groin     APPLICATION OF WOUND VAC Left 05/01/2013   Procedure: APPLICATION OF WOUND VAC;  Surgeon: Jessy Oto, MD;  Location: [facility];  Service: Orthopedics;  Laterality: Left;   I & D EXTREMITY  11/12/2011   Procedure: IRRIGATION AND DEBRIDEMENT EXTREMITY;  Surgeon: Mcarthur Rossetti, MD;  Location: [facility];  Service: Orthopedics;  Laterality: Left;  foot left   I & D EXTREMITY Left 09/06/2012   Procedure: IRRIGATION AND DEBRIDEMENT EXTREMITY;  Surgeon: Marybelle Killings, MD;  Location: [facility];  Service: Orthopedics;  Laterality: Left;   I & D EXTREMITY Left 05/01/2013   Procedure: INCISION AND DRAINAGE LEFT FOREFOOT ABCESS ;  Surgeon: Jessy Oto, MD;  Location: [facility];  Service: Orthopedics;  Laterality: Left;   INCISION AND DRAINAGE ABSCESS Left 12/27/2020   Procedure: INCISION AND DRAINAGE LEFT THIGH ABSCESS;  Surgeon: Clovis Riley, MD;  Location: [facility];  Service: General;  Laterality: Left;   Surgery to remove hematoma in L leg     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     Social History   Socioeconomic History   Marital status: Single    Spouse name: Not on file   Number of children: Not on file   Years of education: Not on file   Highest education level: Not on file  Occupational History   Not on file   Tobacco Use   Smoking status: Never   Smokeless tobacco: Never  Substance and Sexual Activity   Alcohol use: No   Drug use: No   Sexual activity: Never  Other Topics Concern   Not on file  Social History Narrative   Lives at home on her own and is ambulatory, is single and has no children, no family who checks up on her or helps her, and only one friend who helps with daily activities when she can. She is a never smoker.    Social Determinants of Health   Financial Resource Strain: Not on file  Food Insecurity: Not on file  Transportation Needs: Not on file  Physical Activity: Not on file  Stress: Not on file  Social Connections: Not on file  Intimate Partner Violence: Not on file     Allergies  Allergen Reactions   Clindamycin Nausea And Vomiting and Nausea Only   Zofran Itching and Nausea And Vomiting   Doxycycline Nausea And Vomiting   Morphine And Related Itching     No current facility-administered medications on file prior to encounter.   Current Outpatient Medications on File Prior to Encounter  Medication Sig Dispense Refill   atorvastatin (LIPITOR) 40 MG tablet Take 1 tablet (40 mg total) by mouth daily at 6 PM.     buPROPion (  WELLBUTRIN XL) 300 MG 24 hr tablet Take 300 mg by mouth daily.     furosemide (LASIX) 40 MG tablet Take 40 mg by mouth daily.     glucose blood test strip Use as instructed 100 each 12   glucose monitoring kit (FREESTYLE) monitoring kit 1 each by Does not apply route as needed for other. 1 each 0   HUMALOG KWIKPEN 100 UNIT/ML KwikPen Inject subcutaneously twice daily as per sliding scale: if 70-150= 0 units; 151-200= 2 u; 201-250= 4 u; 251-300= 6 u; 301-350= 8 u; 351-400= 10 u; 401-450= 12u; anything greater than 400 call physician.     hydrOXYzine (VISTARIL) 50 MG capsule Take 50 mg by mouth every 6 (six) hours as needed for itching.     Iron-FA-B Cmp-C-Biot-Probiotic (FUSION PLUS) CAPS Take 1 capsule by mouth daily.     lisinopril  (ZESTRIL) 2.5 MG tablet Take 2.5 mg by mouth daily.     omeprazole (PRILOSEC) 20 MG capsule Take 1 capsule by mouth daily.     potassium chloride (KLOR-CON) 10 MEQ tablet Take 10 mEq by mouth daily.     SEMGLEE, YFGN, 100 UNIT/ML SOPN Inject 60 Units into the skin daily.     SitaGLIPtin-MetFORMIN HCl (JANUMET XR) [phone] MG TB24 Take 1 tablet by mouth daily.     traZODone (DESYREL) 50 MG tablet Take 25 mg by mouth at bedtime.     venlafaxine XR (EFFEXOR-XR) 75 MG 24 hr capsule Take 75 mg by mouth daily.     XARELTO 20 MG TABS tablet Take 20 mg by mouth daily.     aspirin EC 325 MG tablet Take 1 tablet (325 mg total) by mouth 2 (two) times daily. (Patient not taking: Reported on 12/26/2020) 30 tablet 0   ferrous sulfate 325 (65 FE) MG tablet Take 1 tablet (325 mg total) by mouth 3 (three) times daily after meals. (Patient not taking: Reported on 12/26/2020)  3   insulin aspart (NOVOLOG) 100 UNIT/ML injection Inject 5 Units into the skin once. For blood sugar >150 (Patient not taking: No sig reported) 4 vial 3   insulin glargine (LANTUS) 100 UNIT/ML injection Inject 0.4 mLs (40 Units total) into the skin daily. (Patient not taking: No sig reported) 4 vial 3     ROS    Exam:  General: Awake, Alert, Oriented *3  Vision (near): without correction    OD: j4  OS: j4  Confrontational Field:   Full to count fingers, both eyes  Extraocular Motility:  Full ductions and versions, both eyes  [place]ox:   Trace commitant exodeviation without vertical.   External:   Normal Symmetry, V1-3 intact, infraorbital nerve appears intact.  Pupils  OD: 6m to 362mreactive without afferent pupillary defect (APD)  OS: 109m39mo 3mm20mactive without afferent pupillary defect (APD)   IOP(tonopen)  OD: 18  OS: 17  Slit Lamp Exam:  Lids/Lashes  OD: Normal Lids and lashes, No lesion or injury  OS: Normal lids and lashes, nor lesion or injury  Conjucntiva/Sclera  OD: White and quiet  OS: White and  quiet  Cornea  OD: Clear without abrasion or defect  OS: Clear without abrasion or defect  Anterior Chamber  OD: Deep and quiet  OS: Deep and quiet  Iris  OD: Normal iris architecture  OS: Normal Iris Architecture   Lens  OD: Clear, Without significant opacities  OS: Clear, Without significant opacities  Anterior Vitreous  OD: Clear, without cell  OS: Clear without cell  POSTERIOR POLE EXAM (Dialated with phenylephrine and tropicamide.Dilation may last up to 24 hours)  View:   OD: 20/20 view without opacities  OS: 20/20 view without opacities  Vitreous:   OD: Clear, no cell  OS: Clear, no cell  Disc:   OD: flat, sharp margin, with appropriate color  OS: flat, sharp margin, with appropriate color  C:D Ratio:   OD: 0.2   OS: 0.2  Macula  OD: Flat, with appropriate light reflex  OS: Flat with appropriate light reflex  Vessels  OD: Normal vasculature, mild tortuosity  OS: Normal vasculatur, mild tortuosity   Periphery  OD: Flat 360 degrees without tear, hole or detachment  OS: Flat 360 degrees without tear, hole or detachment     Assessment and Plan:   This is 45 y.o.  female with necrotizing fascitis and fungemia without signs of fungal endophthalmitis.    No ophthalmic intervention.    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     Follow up as outpatient as needed for glasses update.    Endophthalmitis signs and symptoms reviewed with patient. Follow up with worsening pan, eye redness, new floaters, or worsening vision.    Julian Reil, M.D.  [facility] [address] [place], [place] [address] [phone] ([phone]

## 2021-01-03 NOTE — Progress Notes (Signed)
01/03/2021   Wound / Incision (Open or Dehisced) 12/31/20 Non-pressure wound Thigh Anterior;Distal;Left;Lateral PT HYDROTHERAPY Only  Date First Assessed/Time First Assessed: 12/31/20 1507   Wound Type: Non-pressure wound  Location: Thigh  Location Orientation: Anterior;Distal;Left;Lateral  Wound Description (Comments): PT HYDROTHERAPY Only  Present on Admission: Yes  Dressing Type  --  Gauze (Comment);Abdominal binder  Dressing Changed  --  Changed  Dressing Status  --  Clean;Intact  Dressing Change Frequency  --  Daily  Site / Wound Assessment  --  Red;Yellow;Black;Brown  % Wound base Red or Granulating  --  65%  % Wound base Yellow/Fibrinous Exudate  --  35%  % Wound base Black/Eschar  --    Peri-wound Assessment  --  Intact;Erythema (non-blanchable);Induration  Wound Length (cm)  --    Wound Width (cm)  --    Wound Depth (cm)  --    Wound Volume (cm^3)  --    Wound Surface Area (cm^2)  --    Tunneling (cm)  --  distal portio of wound has small opening in wound bed that tunnels   Margins  --  Unattached edges (unapproximated)  Closure  --  None  Drainage Amount  --  Moderate  Drainage Description  --  Sanguineous;No odor  Non-staged Wound Description  --  Full thickness  Treatment  --  Cleansed;Debridement (Selective);Hydrotherapy (Pulse lavage);Packing (Dry gauze)  Incision (Closed) 12/27/20 Thigh Left  Date First Assessed/Time First Assessed: 12/27/20 0931   Location: Thigh  Location Orientation: Left  Dressing Type Gauze (Comment)  --   Dressing Clean;Dry;Intact  --   Dressing Change Frequency Twice a day  --   Site / Wound Assessment Clean  --   Margins Attached edges (approximated)  --   Drainage Amount Moderate  --   Drainage Description Serosanguineous  --   Treatment Other (Comment)  --   Hydrotherapy  Pulsed Lavage with Suction (psi)  --  10 psi  Pulsed Lavage with Suction - Normal Saline Used  --  1000 mL  Pulsed Lavage Tip  --  Tip with splash shield  Pulsed  lavage therapy - wound location  --  Lt thigh wound  Selective Debridement  Selective Debridement - Location  --  Lt thigh wound  Selective Debridement - Tools Used  --  Forceps;Scissors  Selective Debridement - Tissue Removed  --  devitalized/necrotic tissue and slough  Wound Therapy - Assess/Plan/Recommendations  Wound Therapy - Clinical Statement  --  Patient just returned from [place] having had a TEE and ECHO Per PA order this will be pt's last Hydrotherapy Tx  Wound Therapy - Functional Problem List  --  Immobility, decreased skin integrity, impaired stregth and activity tolerance.  Factors Delaying/Impairing Wound Healing  --  Diabetes Mellitus;Immobility (Obesity)  Hydrotherapy Plan  --  Debridement;Dressing change;Patient/family education;Pulsatile lavage with suction  Wound Therapy - Frequency  --  6X / week  Wound Therapy - Current Recommendations  --  PT  Wound Therapy - Follow Up Recommendations  --  f/u pulsed lavage with suction;f/u selective debridement;dressing changes by RN;dressing changes by family/patient (pt hopes to dc home with assist from friend for dressing changes- will need to complete pt/family ed.)  Wound Therapy Goals - Improve the function of patient's integumentary system by progressing the wound(s) through the phases of wound healing by:  Decrease Necrotic Tissue to  --  25%  Decrease Necrotic Tissue - Progress  --  Goal set today  Increase Granulation Tissue to  --  75%  Increase Granulation Tissue - Progress  --  Goal set today  Improve Drainage Characteristics  --  Min;Serous  Improve Drainage Characteristics - Progress  --  Goal set today  Patient/Family will be able to   --  complete dressing change with min assist from friend as needed.  Patient/Family Instruction Goal - Progress  --  Goal set today  Goals/treatment plan/discharge plan were made with and agreed upon by patient/family  --  Yes  Time For Goal Achievement  --  2 weeks  Wound Therapy -  Potential for Goals  --  Fair    Felecia Shelling  PTA Acute  Rehabilitation Services Pager      [phone] Office      [phone]

## 2021-01-03 NOTE — Progress Notes (Addendum)
PICC not placed 01-03-21

## 2021-01-03 NOTE — Discharge Instructions (Signed)
MIDLINE WOUND CARE: - midline dressing to be changed twice daily - supplies: sterile saline, gauze, scissors, ABD pads, tape  - remove dressing and all packing carefully, moistening with sterile saline as needed to avoid packing/internal dressing sticking to the wound. - clean edges of skin around the wound with water/gauze, making sure there is no tape debris or leakage left on skin that could cause skin irritation or breakdown. - dampen and clean kerlix with sterile saline and pack wound from wound base to skin level, making sure to take note of any possible areas of wound tracking, tunneling and packing appropriately. Wound can be packed loosely. Trim kerlix to size if a whole kerlix is not required. - cover wound with a dry ABD pad and secure with tape.  - write the date/time on the dry dressing/tape to better track when the last dressing change occurred. - apply any skin protectant/powder recommended by clinician to protect skin/skin folds. - change dressing as needed if leakage occurs, wound gets contaminated, or patient requests to shower. - patient may shower daily with wound open and following the shower the wound should be dried and a clean dressing placed.

## 2021-01-03 NOTE — Progress Notes (Signed)
  Echocardiogram 2D Echocardiogram has been performed.  Karen Bennett 01/03/2021, 12:59 PM

## 2021-01-03 NOTE — Progress Notes (Signed)
Progress Note  7 Days Post-Op  Subjective: Patient overall feeling well.  No new complaints   Objective: Vital signs in last 24 hours: Temp:  [97.9 F (36.6 C)-98.2 F (36.8 C)] 97.9 F (36.6 C) (06/13 0521) Pulse Rate:  [58-68] 68 (06/13 0521) Resp:  [17-18] 18 (06/13 0521) BP: (127-147)/(59-68) 147/59 (06/13 0521) SpO2:  [95 %-98 %] 98 % (06/13 0521) Last BM Date: 01/03/21  Intake/Output from previous day: 06/12 0701 - 06/13 0700 In: 1220 [P.O.:720; IV Piggyback:500] Out: 3725 [Urine:3725] Intake/Output this shift: Total I/O In: 0  Out: 100 [Urine:100]  PE: Ext: LLE wound much cleaner today with good tissue, minimal pale adipose tissue.  Overall clean with no purulent drainage  Lab Results:  Recent Labs    01/03/21 0433  WBC 5.8  HGB 11.4*  HCT 37.4  PLT 319   BMET Recent Labs    01/03/21 0433  NA 136  K 4.8  CL 103  CO2 29  GLUCOSE 145*  BUN 30*  CREATININE 1.12*  CALCIUM 8.9   PT/INR No results for input(s): LABPROT, INR in the last 72 hours. CMP     Component Value Date/Time   NA 136 01/03/2021 0433   K 4.8 01/03/2021 0433   CL 103 01/03/2021 0433   CO2 29 01/03/2021 0433   GLUCOSE 145 (H) 01/03/2021 0433   BUN 30 (H) 01/03/2021 0433   CREATININE 1.12 (H) 01/03/2021 0433   CREATININE 0.70 09/01/2013 0954   CALCIUM 8.9 01/03/2021 0433   PROT 7.0 12/30/2020 0453   ALBUMIN 2.3 (L) 12/30/2020 0453   AST 9 (L) 12/30/2020 0453   ALT 10 12/30/2020 0453   ALKPHOS 58 12/30/2020 0453   BILITOT 0.4 12/30/2020 0453   GFRNONAA >60 01/03/2021 0433   GFRNONAA >89 09/01/2013 0954   GFRAA >89 09/01/2013 0954   Lipase     Component Value Date/Time   LIPASE 20 09/05/2011 0120       Studies/Results: No results found.  Anti-infectives: Anti-infectives (From admission, onward)    Start     Dose/Rate Route Frequency Ordered Stop   12/30/20 0400  fluconazole (DIFLUCAN) IVPB 400 mg        400 mg 100 mL/hr over 120 Minutes Intravenous Every  24 hours 12/29/20 0314     12/29/20 1800  cefTRIAXone (ROCEPHIN) 2 g in sodium chloride 0.9 % 100 mL IVPB  Status:  Discontinued        2 g 200 mL/hr over 30 Minutes Intravenous Every 24 hours 12/29/20 1234 12/29/20 1438   12/29/20 1530  ceFAZolin (ANCEF) IVPB 2g/100 mL premix        2 g 200 mL/hr over 30 Minutes Intravenous Every 8 hours 12/29/20 1438     12/29/20 0400  fluconazole (DIFLUCAN) IVPB 400 mg        400 mg 100 mL/hr over 120 Minutes Intravenous Every 2 hours 12/29/20 0314 12/29/20 0754   12/27/20 2000  DAPTOmycin (CUBICIN) 650 mg in sodium chloride 0.9 % IVPB  Status:  Discontinued        6 mg/kg  108.5 kg (Adjusted) 126 mL/hr over 30 Minutes Intravenous Daily 12/27/20 1229 12/27/20 1454   12/27/20 1000  vancomycin (VANCOREADY) IVPB 2000 mg/400 mL  Status:  Discontinued        2,000 mg 200 mL/hr over 120 Minutes Intravenous Every 12 hours 12/27/20 0049 12/27/20 1218   12/27/20 0923  vancomycin (VANCOCIN) 1-5 GM/200ML-% IVPB       Note to Pharmacy:  Karleen Hampshire, [place]   : cabinet override      12/27/20 0923 12/27/20 2129   12/27/20 0923  piperacillin-tazobactam (ZOSYN) 3.375 GM/50ML IVPB       Note to Pharmacy: Ramond Craver   : cabinet override      12/27/20 0923 12/27/20 0928   12/27/20 0200  piperacillin-tazobactam (ZOSYN) IVPB 3.375 g  Status:  Discontinued        3.375 g 12.5 mL/hr over 240 Minutes Intravenous Every 8 hours 12/27/20 0049 12/29/20 1234   12/26/20 2000  vancomycin (VANCOREADY) IVPB 2000 mg/400 mL        2,000 mg 200 mL/hr over 120 Minutes Intravenous Once 12/26/20 1954 12/27/20 0136   12/26/20 1945  piperacillin-tazobactam (ZOSYN) IVPB 3.375 g        3.375 g 100 mL/hr over 30 Minutes Intravenous  Once 12/26/20 1930 12/27/20 0136   12/26/20 1945  vancomycin (VANCOCIN) 2,000 mg in sodium chloride 0.9 % 500 mL IVPB  Status:  Discontinued        2,000 mg 250 mL/hr over 120 Minutes Intravenous  Once 12/26/20 1931 12/26/20 1953         Assessment/Plan POD 7, s/p I&D of L thigh abscess, Dr. Fredricka Bonine - wound much improved with hydrotherapy since Friday.  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  Patient is getting at time 2-3 trays per meal. Candidemia - fluconazole per ID, TTE to r/o endocarditis today   FEN - carb mod diet VTE - Lovenox ID - ancef 6/8>> Follow up - [facility] clinic   Morbid obesity DM HTN D/O DVT/PE AKI  LOS: 7 days    Letha Cape, [facility] Surgery 01/03/2021, 10:56 AM Please see Amion for pager number during day hours 7:00am-4:30pm

## 2021-01-03 NOTE — Transfer of Care (Signed)
Immediate Anesthesia Transfer of Care Note  Patient: Karen Bennett  Procedure(s) Performed: TRANSESOPHAGEAL ECHOCARDIOGRAM (TEE)  Patient Location: PACU and Endoscopy Unit  Anesthesia Type:MAC  Level of Consciousness: drowsy, patient cooperative and responds to stimulation  Airway & Oxygen Therapy: Patient Spontanous Breathing  Post-op Assessment: Report given to RN and Post -op Vital signs reviewed and stable  Post vital signs: Reviewed and stable  Last Vitals:  Vitals Value Taken Time  BP    Temp    Pulse    Resp    SpO2      Last Pain:  Vitals:   01/03/21 1133  TempSrc: Temporal  PainSc: 0-No pain      Patients Stated Pain Goal: 2 (74/93/55 2174)  Complications: No notable events documented.

## 2021-01-03 NOTE — CV Procedure (Addendum)
    PROCEDURE NOTE:  Procedure:  Transesophageal echocardiogram Operator:  [name], MD Indications:  Bacteremia Complications: None  During this procedure the patient is administered a total of Propofol 470mg , Lidocaine 60mg  and Versed 2mg  to achieve and maintain moderate conscious sedation.  The patient's heart rate, blood pressure, and oxygen saturation are monitored continuously during the procedure by anesthesia.   Results: Normal LV size and function Normal RV size and function Normal RA  Normal LA and LA appendage with normal emptying velocity Normal TV with mild to moderate TR Normal PV with mild to moderate PR There is a thin filamentous highly mobile density off the atrial side of the anterior mitral valve leaflet that is seen only in the 4 and 2 chamber views at 0 degrees and 130 degrees that likely represents a vegetation.  The anterior MV leaflet is thickened and calcified at the leaflet tip extending in the the subvalvular apparatus of the attached chordae tendinae.  There is mild to moderate MR. Trileaflet AV with mild aortic valve sclerosis with no stenosis and trivial AR. Normal interatrial septum with no evidence of shunt by colorflow dopper  Normal thoracic and ascending aorta. Mitral valve vegetation is present.   The patient tolerated the procedure well and was transferred back to their room in stable condition.  Signed: , MD [facility]

## 2021-01-04 ENCOUNTER — Inpatient Hospital Stay (HOSPITAL_COMMUNITY): Payer: Medicare Other

## 2021-01-04 HISTORY — PX: IR VENO/EXT/UNI RIGHT: IMG676

## 2021-01-04 HISTORY — PX: IR PTA VENOUS EXCEPT DIALYSIS CIRCUIT: IMG6126

## 2021-01-04 LAB — CULTURE, BLOOD (ROUTINE X 2)
Culture: NO GROWTH
Culture: NO GROWTH
Special Requests: ADEQUATE
Special Requests: ADEQUATE

## 2021-01-04 LAB — GLUCOSE, CAPILLARY
Glucose-Capillary: 143 mg/dL — ABNORMAL HIGH (ref 70–99)
Glucose-Capillary: 85 mg/dL (ref 70–99)
Glucose-Capillary: 145 mg/dL — ABNORMAL HIGH (ref 70–99)
Glucose-Capillary: 192 mg/dL — ABNORMAL HIGH (ref 70–99)

## 2021-01-04 MED ORDER — COLLAGENASE 250 UNIT/GM EX OINT
TOPICAL_OINTMENT | Freq: Every day | CUTANEOUS | 0 refills | Status: DC
Start: 2021-01-04 — End: 2023-01-16

## 2021-01-04 MED ORDER — LIDOCAINE HCL 1 % IJ SOLN
INTRAMUSCULAR | Status: AC
  Administered 2021-01-04: 5 mL
  Filled 2021-01-04: qty 20

## 2021-01-04 MED ORDER — IOHEXOL 300 MG/ML  SOLN
50.0000 mL | Freq: Once | INTRAMUSCULAR | Status: AC | PRN
  Administered 2021-01-04: 5 mL

## 2021-01-04 MED ORDER — COLLAGENASE 250 UNIT/GM EX OINT
TOPICAL_OINTMENT | Freq: Every day | CUTANEOUS | Status: DC
  Filled 2021-01-04: qty 30

## 2021-01-04 MED ORDER — CASPOFUNGIN IV (FOR PTA / DISCHARGE USE ONLY): 150.0000 mg | INTRAVENOUS | 0 refills | Status: DC

## 2021-01-04 MED ORDER — HEPARIN SOD (PORK) LOCK FLUSH 100 UNIT/ML IV SOLN
INTRAVENOUS | Status: AC
  Administered 2021-01-04: 4 [IU]
  Filled 2021-01-04: qty 5

## 2021-01-04 MED ORDER — INSULIN GLARGINE 100 UNIT/ML ~~LOC~~ SOLN: 45.0000 [IU] | Freq: Every day | SUBCUTANEOUS | Status: DC

## 2021-01-04 MED ORDER — ACETAMINOPHEN 325 MG PO TABS: 650.0000 mg | ORAL_TABLET | Freq: Four times a day (QID) | ORAL | Status: DC

## 2021-01-04 MED ORDER — CEPHALEXIN 500 MG PO CAPS: 500.0000 mg | ORAL_CAPSULE | Freq: Four times a day (QID) | ORAL | 0 refills | Status: AC

## 2021-01-04 NOTE — TOC Progression Note (Addendum)
Transition of Care [facility]) - Progression Note    Patient Details  Name: Karen Bennett MRN: [id number] Date of Birth: [birth year]  Transition of [facility] CM/SW Contact  Lennart Pall, LCSW Phone Number: 01/04/2021, 12:33 PM  Clinical Narrative:    Met with pt to review updated dc plans/ needs for home.  Pt aware that she will now need several weeks of IV abx at home and notes she has managed this before.  She is feeling very comfortable with managing abx again and has confirmed with her friend that she will be able to assist with wound care.  Education with friend to take place later this afternoon.  Have placed referral with Carolynn Sayers with [employer] for IV abx and she hopes to loop in this afternoon as well for abx education with friend.  Continue to work on Administrator, arts agency for [facility] and PT visits. Alerted by RN that PICC now to be placed by IR.  Anticipate dc tomorrow and pt confirms she will need PTAR transport.  Continue to follow.  Addendum:  Have secured [facility] for [facility] and PT visits.  Expected Discharge Plan: [place] Barriers to Discharge: Continued Medical Work up  Expected Discharge Plan and Services Expected Discharge Plan: [place]   Discharge Planning Services: CM Consult   Living arrangements for the past 2 months: Apartment                                       Social Determinants of Health (SDOH) Interventions    Readmission Risk Interventions No flowsheet data found.

## 2021-01-04 NOTE — Discharge Summary (Addendum)
Physician Discharge Summary  Karen Bennett CBU:[id number] DOB: [birth year] DOA: 12/26/2020  PCP: Merryl Hacker, No  Admit date: 12/26/2020 Discharge date: 01/04/2021  Time spent: 14mnutes  Recommendations for Outpatient Follow-up:  Home health PT and RN, continue daily dressing changes and IV anidulafungin for 6 weeks to be completed on 7/18 Infectious disease Dr. 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 Morbid obesity, BMI of 68 DM (diabetes mellitus), type 2, uncontrolled with complications ([place] Right BKA   Anxiety and depression   Cellulitis   BMI 60.0-69.9, adult ([place]   HTN (hypertension)   History of pulmonary embolism   Pressure injury of skin   Infective endocarditis   Discharge Condition: Stable  Diet recommendation diabetic  Filed Weights   12/26/20 1843 12/27/20 0716 01/03/21 1133  Weight: (!) 182.3 kg (!) 182.3 kg (!) 182.3 kg    History of present illness:  Morbidly obese 45year old female history of type 2 diabetes mellitus, prior history of DVT/PE, right BKA, super morbid obesity, BMI of 68 presented to the ED with left lower extremity pain rash and blistering . -Noted to have left thigh abscess/necrotizing fasciitis this was confirmed on imaging  Hospital Course:   Left lateral thigh abscess -Sp I&D 6/6 by Dr. CWindle Guard-Wound culture with Klebsiella -Treated with IV Ancef, completed 9 days inpatient, will transition to oral Keflex for 6 more days -adamantly declines, will need home health RN supplemented by family/friend assistance to do dressing changes -Close follow-up with [facility]   Candida albicans fungemia/mitral valve endocarditis -Has extensive body folds with MASD and cutaneous Candida infection -Currently on IV fluconazole day 7 -OR cultures from thigh abscess negative for yeast -Appreciate infectious disease  input, TEE completed yesterday and this was concerning for vegetation involving the mitral valve leaflet -Repeat blood cultures are negative -Ophthalmology evaluation completed, ruled out endophthalmitis -Dr. SBaxter Flatteryrecommended 6 weeks of IV anidulafungin followed by fluconazole 400 mg daily for long-term suppression -PICC line to be placed today   Uncontrolled hyperglycemia, T2DM. Morbid obesity, BMI is 65 -Hemoglobin A1c is 15.5 -CBGs more stable, continue current dose of Lantus and Premeal NovoLog -Needs to lose tremendous amount of weight -uses a motorized wheelchair   Right BKA   Hypertension -Stable, lisinopril resumed at discharge   Stage II right buttock pressure ulcer present on admission, educated regarding frequent repositioning   Hx of DVT and PE. -She had not been taking rivaroxaban as outpatient, this was resumed by my partner on admission -Remains at high risk for VTE   AKI, hyponatremia. -Resolved, lisinopril resumed     Results: Normal LV size and function Normal RV size and function Normal RA Normal LA and LA appendage with normal emptying velocity Normal TV with mild to moderate TR Normal PV with mild to moderate PR There is a thin filamentous highly mobile density off the atrial side of the anterior mitral valve leaflet that is seen only in the 4 and 2 chamber views at 0 degrees and 130 degrees that likely represents a vegetation.  The anterior MV leaflet is thickened and calcified at the leaflet tip extending in the the subvalvular apparatus of the attached chordae tendinae.  There is mild to moderate MR. Trileaflet AV with mild aortic valve sclerosis with no stenosis and trivial AR. Normal interatrial septum with no evidence of shunt by colorflow dopper Normal thoracic and ascending aorta. Mitral valve vegetation  is present.   Discharge Exam: Vitals:   01/04/21 0427 01/04/21 1327  BP: (!) 146/71 140/65  Pulse: 63 63  Resp: 18 18  Temp: 97.8 F  (36.6 C) 97.7 F (36.5 C)  SpO2: 100% 98%    General: Morbidly obese pleasant female, laying in bed, AAOx3, no distress CVS: S1-S2, regular rate rhythm Lungs: Distant breath sounds Abdomen: Soft, obese, nontender, large abdominal pannus, bowel sounds present Extremities, left lateral thigh with dressing, right BKA  Skin: As above  Discharge Instructions   Discharge Instructions     [facility] pharmacist to adjust dose for Vancomycin, Aminoglycosides and other anti-infective therapies as requested by physician.   Complete by: As directed    [facility] to provide Cath Flo 30m   Complete by: As directed    Administer for PICC line occlusion and as ordered by physician for other access device issues.   Ambulatory referral to Cardiothoracic Surgery   Complete by: As directed    Morbidly obese uncontrolled diabetic with Candida albicans mitral valve endocarditis   Anaphylaxis Kit: Provided to treat any anaphylactic reaction to the medication being provided to the patient if First Dose or when requested by physician   Complete by: As directed    Epinephrine 139mml vial / amp: Administer 0.6m2m0.6ml66mubcutaneously once for moderate to severe anaphylaxis, nurse to call physician and pharmacy when reaction occurs and call 911 if needed for immediate care   Diphenhydramine 50mg65mIV vial: Administer 25-50mg 84mM PRN for first dose reaction, rash, itching, mild reaction, nurse to call physician and pharmacy when reaction occurs   Sodium Chloride 0.9% NS 500ml I7mdminister if needed for hypovolemic blood pressure drop or as ordered by physician after call to physician with anaphylactic reaction   Change dressing on IV access line weekly and PRN   Complete by: As directed    Flush IV access with Sodium Chloride 0.9% and Heparin 10 units/ml or 100 units/ml   Complete by: As directed    Home infusion instructions - [facility]   Complete by: As directed     Instructions: Flush IV access with Sodium Chloride 0.9% and Heparin 10units/ml or 100units/ml   Change dressing on IV access line: Weekly and PRN   Instructions Cath Flo 2mg: Ad15mister for PICC Line occlusion and as ordered by physician for other access device   [facility] pharmacist to adjust dose for: Vancomycin, Aminoglycosides and other anti-infective therapies as requested by physician   Method of administration may be changed at the discretion of home infusion pharmacist based upon assessment of the patient and/or caregiver's ability to self-administer the medication ordered   Complete by: As directed       Allergies as of 01/04/2021       Reactions   Clindamycin Nausea And Vomiting, Nausea Only   Zofran Itching, Nausea And Vomiting   Doxycycline Nausea And Vomiting   Morphine And Related Itching        Medication List     STOP taking these medications    aspirin EC 325 MG tablet   ferrous sulfate 325 (65 FE) MG tablet   insulin aspart 100 UNIT/ML injection Commonly known as: novoLOG       TAKE these medications    acetaminophen 325 MG tablet Commonly known as: TYLENOL Take 2 tablets (650 mg total) by mouth every 6 (six) hours.   atorvastatin 40 MG tablet Commonly known as: LIPITOR Take 1 tablet (40 mg total)  by mouth daily at 6 PM.   buPROPion 300 MG 24 hr tablet Commonly known as: WELLBUTRIN XL Take 300 mg by mouth daily.   caspofungin  IVPB Commonly known as: CANCIDAS Inject 150 mg into the vein daily. Indication:  candida albicans endocarditis First Dose: Yes Last Day of Therapy:  02/14/2021 Labs - Once weekly:  CBC/D and BMP, Labs - Every other week:  ESR and CRP Method of administration: Elastomeric Method of administration may be changed at the discretion of home infusion pharmacist based upon assessment of the patient and/or caregiver's ability to self-administer the medication ordered.   cephALEXin 500 MG capsule Commonly known  as: KEFLEX Take 1 capsule (500 mg total) by mouth every 6 (six) hours for 6 days.   collagenase ointment Commonly known as: SANTYL Apply topically daily. To left leg   furosemide 40 MG tablet Commonly known as: LASIX Take 40 mg by mouth daily.   Fusion Plus Caps Take 1 capsule by mouth daily.   glucose blood test strip Use as instructed   glucose monitoring kit monitoring kit 1 each by Does not apply route as needed for other.   HumaLOG KwikPen 100 UNIT/ML KwikPen Generic drug: insulin lispro Inject subcutaneously twice daily as per sliding scale: if 70-150= 0 units; 151-200= 2 u; 201-250= 4 u; 251-300= 6 u; 301-350= 8 u; 351-400= 10 u; 401-450= 12u; anything greater than 400 call physician.   hydrOXYzine 50 MG capsule Commonly known as: VISTARIL Take 50 mg by mouth every 6 (six) hours as needed for itching.   insulin glargine 100 UNIT/ML injection Commonly known as: LANTUS Inject 0.45 mLs (45 Units total) into the skin daily. What changed: how much to take   Janumet XR [phone] MG Tb24 Generic drug: SitaGLIPtin-MetFORMIN HCl Take 1 tablet by mouth daily.   lisinopril 2.5 MG tablet Commonly known as: ZESTRIL Take 2.5 mg by mouth daily.   omeprazole 20 MG capsule Commonly known as: PRILOSEC Take 1 capsule by mouth daily.   potassium chloride 10 MEQ tablet Commonly known as: KLOR-CON Take 10 mEq by mouth daily.   Semglee (yfgn) 100 UNIT/ML Sopn Generic drug: Insulin Glargine-yfgn Inject 60 Units into the skin daily.   traZODone 50 MG tablet Commonly known as: DESYREL Take 25 mg by mouth at bedtime.   venlafaxine XR 75 MG 24 hr capsule Commonly known as: EFFEXOR-XR Take 75 mg by mouth daily.   Xarelto 20 MG Tabs tablet Generic drug: rivaroxaban Take 20 mg by mouth daily.               Discharge Care Instructions  (From admission, onward)           Start     Ordered   01/04/21 0000  Change dressing on IV access line weekly and PRN  (Home  infusion instructions - [facility] )        01/04/21 1139           Allergies  Allergen Reactions   Clindamycin Nausea And Vomiting and Nausea Only   Zofran Itching and Nausea And Vomiting   Doxycycline Nausea And Vomiting   Morphine And Related Itching    Follow-up Information     [facility], [facility] Follow up on 01/25/2021.   Specialty: General Surgery Why: 9:45 Am, arrive by 9:15am for paperwork and check in process Contact information: [address] [place]  [address] [phone]         Carlyle Basques, MD. Go in  1 month(s).   Specialty: Infectious Diseases Contact information: [address] [place] [address] [phone]                  The results of significant diagnostics from this hospitalization (including imaging, microbiology, ancillary and laboratory) are listed below for reference.    Significant Diagnostic Studies: ECHO TEE  Result Date: 01/04/2021    TRANSESOPHOGEAL ECHO REPORT   Patient Name:   Karen Bennett Date of Exam: 01/03/2021 Medical Rec #:  [id number]       Height:       66.0 in Accession #:    [id number]      Weight:       401.9 lb Date of Birth:  03-Dec-1975      BSA:          2.690 m Patient Age:    30 years        BP:           167/84 mmHg Patient Gender: F               HR:           70 bpm. Exam Location:  Inpatient Procedure: 2D Echo, Cardiac Doppler and Color Doppler Indications:     Bacteremia  History:         Patient has prior history of Echocardiogram examinations, most                  recent 09/06/2012.  Sonographer:     Merrie Roof RDCS Referring Phys:  [id number] Leanor Kail Diagnosing Phys: Fransico Him MD PROCEDURE: After discussion of the risks and benefits of a TEE, an informed consent was obtained from the patient. The transesophogeal probe was passed without difficulty through the esophogus of the patient. Imaged were obtained with the patient in a supine position.  Local oropharyngeal anesthetic was provided with Cetacaine. Sedation performed by different physician. The patient was monitored while under deep sedation. Anesthestetic sedation was provided intravenously by Anesthesiology: 445m of Propofol, 651mof Lidocaine. Image quality was excellent. The patient's vital signs; including heart rate, blood pressure, and oxygen saturation; remained stable throughout the procedure. The patient developed no complications during the procedure. IMPRESSIONS  1. Left ventricular ejection fraction, by estimation, is 60 to 65%. The left ventricle has normal function. The left ventricle has no regional wall motion abnormalities.  2. Right ventricular systolic function is normal. The right ventricular size is normal.  3. No left atrial/left atrial appendage thrombus was detected.  4. The mitral valve is abnormal. There is a very thin filamentous highly mobile target that appears to originate from the anterior mitral valve leaflet that is seen only in the 4 and 2 chamber views at 0 degrees and 130 degrees that likely represents a vegetation. Mild to moderate mitral valve regurgitation. No evidence of mitral stenosis.  5. Tricuspid valve regurgitation is mild to moderate.  6. The aortic valve is tricuspid. Aortic valve regurgitation is trivial. Mild aortic valve sclerosis is present, with no evidence of aortic valve stenosis.  7. The inferior vena cava is normal in size with greater than 50% respiratory variability, suggesting right atrial pressure of 3 mmHg. Conclusion(s)/Recommendation(s): Normal biventricular function with evidence of possible mitral valve vegetation. Findings are concerning for vegetation/infective endocarditis as detailed above. FINDINGS  Left Ventricle: Left ventricular ejection fraction, by estimation, is 60 to 65%. The left ventricle has normal function. The left ventricle has no regional wall  motion abnormalities. The left ventricular internal cavity size was  normal in size. There is  no left ventricular hypertrophy. Abnormal (paradoxical) septal motion, consistent with left bundle branch block. Right Ventricle: The right ventricular size is normal. No increase in right ventricular wall thickness. Right ventricular systolic function is normal. Left Atrium: Left atrial size was normal in size. No left atrial/left atrial appendage thrombus was detected. Right Atrium: Right atrial size was normal in size. Pericardium: There is no evidence of pericardial effusion. Mitral Valve: There is a very thin filamentous highly mobile target that appears to originate from the anterior mitral valve leaflet that is seen only in the 4 and 2 chamber views at 0 degrees and 130 degrees that likely represents a vegetation. The mitral valve is abnormal. Mild to moderate mitral valve regurgitation. No evidence of mitral valve stenosis. Tricuspid Valve: The tricuspid valve is normal in structure. Tricuspid valve regurgitation is mild to moderate. No evidence of tricuspid stenosis. Aortic Valve: The aortic valve is tricuspid. Aortic valve regurgitation is trivial. Mild aortic valve sclerosis is present, with no evidence of aortic valve stenosis. Pulmonic Valve: The pulmonic valve was normal in structure. Pulmonic valve regurgitation is mild to moderate. No evidence of pulmonic stenosis. Aorta: The aortic root is normal in size and structure. Venous: The inferior vena cava is normal in size with greater than 50% respiratory variability, suggesting right atrial pressure of 3 mmHg. IAS/Shunts: No atrial level shunt detected by color flow Doppler. Fransico Him MD Electronically signed by Fransico Him MD Signature Date/Time: 01/04/2021/8:47:18 AM    Final    CT EXTREMITY LOWER LEFT W CONTRAST  Result Date: 12/26/2020 CLINICAL DATA:  Soft tissue infection suspected, lower leg, xray done L leg infection just proximal to the knee EXAM: CT OF THE LOWER LEFT EXTREMITY (down to the knee) WITH CONTRAST  TECHNIQUE: Multidetector CT imaging of the lower left extremity (down to the knee) was performed according to the standard protocol following intravenous contrast administration. CONTRAST:  169m OMNIPAQUE IOHEXOL 300 MG/ML  SOLN COMPARISON:  None. FINDINGS: Bones/Joint/Cartilage No cortical erosion or destruction. No acute displaced fracture or dislocation. Mild to moderate tricompartmental degenerative changes of the knee. Trace left knee effusion. No severe arthropathy of the hip. Ligaments Suboptimally assessed by CT. Muscles and Tendons Atrophic musculature. Question mild edema along the deep fascial layers (4:224-226.) Soft tissues Subcutaneus soft tissue edema and emphysema along the proximal to mid anterolateral left thigh subcutaneus soft tissues. Overlying dermal thickening. No organized fluid collection. Subcutaneus soft tissue edema and dermal thickening along the posteromedial left thigh and visualized posteromedial leg. Other: At least moderate calcified atherosclerotic plaque of the arteries with limited evaluation due to timing of contrast. Varicose veins noted. IMPRESSION: 1. Subcutaneus soft tissue edema and emphysema of the left thigh. Findings consistent with infection with concern for a necrotizing fasciitis. Please correlate clinically as this is a clinical diagnosis. 2. No organized fluid collection. 3. Atherosclerotic plaque. 4. Please note the left leg was not imaged (only thigh). These results were called by telephone at the time of interpretation on 12/26/2020 at 10:31 pm [facility] who verbally acknowledged these results. Electronically Signed   By: MIven FinnM.D.   On: 12/26/2020 22:35   [place]KG SITE RITE  Result Date: 01/03/2021 If Site Rite image not attached, placement could not be confirmed due to current cardiac rhythm.   Microbiology: Recent Results (from the past 240 hour(s))  Blood culture (routine x 2)  Status: Abnormal (Preliminary result)    Collection Time: 12/26/20  6:56 PM   Specimen: BLOOD RIGHT ARM  Result Value Ref Range Status   Specimen Description   Final    BLOOD RIGHT ARM Performed at [facility], [address], [place], [place] [address]    Special Requests   Final    BOTTLES DRAWN AEROBIC AND ANAEROBIC Blood Culture adequate volume Performed at [facility] [address] [place], [place] [address]    Culture  Setup Time (A)  Final    YEAST AEROBIC BOTTLE ONLY CRITICAL RESULT CALLED TO, READ BACK BY AND VERIFIED WITH: [place] L. [id number] FCP     Culture (A)  Final    CANDIDA ALBICANS Sent to [facility] for further susceptibility testing. Performed at [facility], [place] [address], [place], [place] [address]    Report Status PENDING  Incomplete  Blood Culture ID Panel (Reflexed)     Status: Abnormal   Collection Time: 12/26/20  6:56 PM  Result Value Ref Range Status   Enterococcus faecalis NOT DETECTED NOT DETECTED Final   Enterococcus Faecium NOT DETECTED NOT DETECTED Final   Listeria monocytogenes NOT DETECTED NOT DETECTED Final   Staphylococcus species NOT DETECTED NOT DETECTED Final   Staphylococcus aureus (BCID) NOT DETECTED NOT DETECTED Final   Staphylococcus epidermidis NOT DETECTED NOT DETECTED Final   Staphylococcus lugdunensis NOT DETECTED NOT DETECTED Final   Streptococcus species NOT DETECTED NOT DETECTED Final   Streptococcus agalactiae NOT DETECTED NOT DETECTED Final   Streptococcus pneumoniae NOT DETECTED NOT DETECTED Final   Streptococcus pyogenes NOT DETECTED NOT DETECTED Final   A.calcoaceticus-baumannii NOT DETECTED NOT DETECTED Final   Bacteroides fragilis NOT DETECTED NOT DETECTED Final   Enterobacterales NOT DETECTED NOT DETECTED Final   Enterobacter cloacae complex NOT DETECTED NOT DETECTED Final   Escherichia coli NOT DETECTED NOT DETECTED Final   Klebsiella aerogenes NOT DETECTED NOT DETECTED Final   Klebsiella oxytoca NOT DETECTED  NOT DETECTED Final   Klebsiella pneumoniae NOT DETECTED NOT DETECTED Final   Proteus species NOT DETECTED NOT DETECTED Final   Salmonella species NOT DETECTED NOT DETECTED Final   Serratia marcescens NOT DETECTED NOT DETECTED Final   Haemophilus influenzae NOT DETECTED NOT DETECTED Final   Neisseria meningitidis NOT DETECTED NOT DETECTED Final   Pseudomonas aeruginosa NOT DETECTED NOT DETECTED Final   Stenotrophomonas maltophilia NOT DETECTED NOT DETECTED Final   Candida albicans DETECTED (A) NOT DETECTED Final    Comment: CRITICAL RESULT CALLED TO, READ BACK BY AND VERIFIED WITH: PHARMD MICHELLE L. [id number] FCP     Candida auris NOT DETECTED NOT DETECTED Final   Candida glabrata NOT DETECTED NOT DETECTED Final   Candida krusei NOT DETECTED NOT DETECTED Final   Candida parapsilosis NOT DETECTED NOT DETECTED Final   Candida tropicalis NOT DETECTED NOT DETECTED Final   Cryptococcus neoformans/gattii NOT DETECTED NOT DETECTED Final    Comment: Performed at [facility], [address], [place], [place] [address]  Antifungal AST 9 Drug Panel     Status: None   Collection Time: 12/26/20  6:56 PM  Result Value Ref Range Status   Organism ID, Yeast Candida albicans  Corrected    Comment: (NOTE) Identification performed by account, not confirmed by this laboratory. CORRECTED ON 06/13 AT 1236: PREVIOUSLY REPORTED AS Preliminary report    Amphotericin B MIC 0.5 ug/mL  Final    Comment: (NOTE) *Breakpoints have been established for  only some organism-drug combinations as indicated. Results of this test are labeled for research purposes only by the assay's manufacturer. The performance characteristics of this assay have not been established by the manufacturer. The result should not be used for treatment or for diagnostic purposes without confirmation of the diagnosis by another medically established diagnostic product or procedure. The performance characteristics were determined  by [facility].    Anidulafungin MIC Comment  Final    Comment: (NOTE) 0.03 ug/mL Susceptible *Breakpoints have been established for only some organism-drug combinations as indicated. Results of this test are labeled for research purposes only by the assay's manufacturer. The performance characteristics of this assay have not been established by the manufacturer. The result should not be used for treatment or for diagnostic purposes without confirmation of the diagnosis by another medically established diagnostic product or procedure. The performance characteristics were determined by [facility].    Caspofungin MIC Comment  Final    Comment: 0.12 ug/mL Susceptible   Micafungin MIC Comment  Final    Comment: 0.016 ug/mL Susceptible   Posaconazole MIC 0.03 ug/mL  Final    Comment: (NOTE) *Breakpoints have been established for only some organism-drug combinations as indicated. Results of this test are labeled for research purposes only by the assay's manufacturer. The performance characteristics of this assay have not been established by the manufacturer. The result should not be used for treatment or for diagnostic purposes without confirmation of the diagnosis by another medically established diagnostic product or procedure. The performance characteristics were determined by [facility].    Fluconazole Islt MIC 0.5 ug/mL Susceptible  Final   Flucytosine MIC 0.5 ug/mL  Final   Itraconazole MIC 0.06 ug/mL  Final   Voriconazole MIC Comment  Final    Comment: (NOTE) 0.016 ug/mL Susceptible Performed At: [facility] [place], [place] [id number] Rush Farmer MD FI:[id number]    Source [phone]  Final    Comment: Performed at [facility], [place] [address], [place], [place] [address]  Resp Panel by RT-PCR (Flu A&B, Covid) Nasopharyngeal Swab     Status: None   Collection Time: 12/26/20 10:33 PM   Specimen: Nasopharyngeal Swab; Nasopharyngeal(NP) swabs in vial  transport medium  Result Value Ref Range Status   SARS Coronavirus 2 by RT PCR NEGATIVE NEGATIVE Final    Comment: (NOTE) SARS-CoV-2 target nucleic acids are NOT DETECTED.  The SARS-CoV-2 RNA is generally detectable in upper respiratory specimens during the acute phase of infection. The lowest concentration of SARS-CoV-2 viral copies this assay can detect is 138 copies/mL. A negative result does not preclude SARS-Cov-2 infection and should not be used as the sole basis for treatment or other patient management decisions. A negative result may occur with  improper specimen collection/handling, submission of specimen other than nasopharyngeal swab, presence of viral mutation(s) within the areas targeted by this assay, and inadequate number of viral copies(<138 copies/mL). A negative result must be combined with clinical observations, patient history, and epidemiological information. The expected result is Negative.  Fact Sheet for Patients:  Entrepreneur[employer]ulse.[employer].au  Fact Sheet for Healthcare Providers:  IncredibleEmployment.be  This test is no t yet approved or cleared by the [place] FDA and  has been authorized for detection and/or diagnosis of SARS-CoV-2 by FDA under an Emergency Use Authorization (EUA). This EUA will remain  in effect (meaning this test can be used) for the duration of the COVID-19 declaration under Section 564(b)(1) of the Act, 21 U.S.C.section 360bbb-3(b)(1), unless the authorization is  terminated  or revoked sooner.       Influenza A by PCR NEGATIVE NEGATIVE Final   Influenza B by PCR NEGATIVE NEGATIVE Final    Comment: (NOTE) The Xpert Xpress SARS-CoV-2/FLU/RSV plus assay is intended as an aid in the diagnosis of influenza from Nasopharyngeal swab specimens and should not be used as a sole basis for treatment. Nasal washings and aspirates are unacceptable for Xpert Xpress SARS-CoV-2/FLU/RSV testing.  Fact  Sheet for Patients: EntrepreneurPulse.com.au  Fact Sheet for Healthcare Providers: IncredibleEmployment.be  This test is not yet approved or cleared by the [place] FDA and has been authorized for detection and/or diagnosis of SARS-CoV-2 by FDA under an Emergency Use Authorization (EUA). This EUA will remain in effect (meaning this test can be used) for the duration of the COVID-19 declaration under Section 564(b)(1) of the Act, 21 U.S.C. section 360bbb-3(b)(1), unless the authorization is terminated or revoked.  Performed at [facility], [place] [address] [place], [place] [address]   Blood culture (routine x 2)     Status: None   Collection Time: 12/27/20  2:15 AM   Specimen: BLOOD  Result Value Ref Range Status   Specimen Description   Final    BLOOD SITE NOT SPECIFIED Performed at [facility] [address] [place], [place] [address]    Special Requests   Final    BOTTLES DRAWN AEROBIC AND ANAEROBIC Blood Culture adequate volume Performed at [facility] [address], [place], [place] [address]    Culture   Final    NO GROWTH 5 DAYS Performed at [facility], [place] [address], [place], [place] [address]    Report Status 01/01/2021 FINAL  Final  Aerobic/Anaerobic Culture w Gram Stain (surgical/deep wound)     Status: None   Collection Time: 12/27/20  8:37 AM   Specimen: Abscess  Result Value Ref Range Status   Specimen Description   Final    ABSCESS LEFT THIGH Performed at [place] [address], [place], [place] [address]    Special Requests   Final    NONE Performed at [facility], [place] [address], [place], [place] [address]    Gram Stain   Final    FEW WBC PRESENT, PREDOMINANTLY PMN FEW GRAM NEGATIVE RODS    Culture   Final    ABUNDANT KLEBSIELLA PNEUMONIAE NO ANAEROBES ISOLATED Performed at [facility], [place] [address], [place], [place] [address]    Report Status 01/01/2021 FINAL  Final   Organism ID, Bacteria KLEBSIELLA PNEUMONIAE  Final      Susceptibility   Klebsiella pneumoniae - MIC*    AMPICILLIN >=32 RESISTANT Resistant     CEFAZOLIN <=4 SENSITIVE Sensitive     CEFEPIME <=0.12 SENSITIVE Sensitive     CEFTAZIDIME <=1 SENSITIVE Sensitive     CEFTRIAXONE <=0.25 SENSITIVE Sensitive     CIPROFLOXACIN <=0.25 SENSITIVE Sensitive     GENTAMICIN <=1 SENSITIVE Sensitive     IMIPENEM <=0.25 SENSITIVE Sensitive     TRIMETH/SULFA <=20 SENSITIVE Sensitive     AMPICILLIN/SULBACTAM 4 SENSITIVE Sensitive     PIP/TAZO <=4 SENSITIVE Sensitive     * ABUNDANT KLEBSIELLA PNEUMONIAE  Culture, blood (routine x 2)     Status: None   Collection Time: 12/30/20  7:53 AM   Specimen: Right Antecubital; Blood  Result Value Ref Range Status   Specimen Description   Final    RIGHT ANTECUBITAL Performed  at [facility], [place] [address] [place], [place] [address]    Special Requests   Final    BOTTLES DRAWN AEROBIC AND ANAEROBIC Blood Culture adequate volume Performed at [facility] [address], [place], [place] [address]    Culture   Final    NO GROWTH 5 DAYS Performed at [facility], [place] [address] [place], [place] [address]    Report Status 01/04/2021 FINAL  Final  Culture, blood (routine x 2)     Status: None   Collection Time: 12/30/20  7:53 AM   Specimen: Left Antecubital; Blood  Result Value Ref Range Status   Specimen Description   Final    LEFT ANTECUBITAL Performed at [facility] [place] [address], [place], [place] [address]    Special Requests   Final    BOTTLES DRAWN AEROBIC ONLY Blood Culture adequate volume Performed at [facility] [address] [place], [place] [address]    Culture   Final    NO GROWTH 5 DAYS Performed at [facility], [place] [address] [place], [place] [address]     Report Status 01/04/2021 FINAL  Final     Labs: Basic Metabolic Panel: Recent Labs  Lab 12/29/20 0451 12/30/20 0453 12/31/20 0439 01/03/21 0433  NA 131* 130* 135 136  K 3.9 3.9 4.2 4.8  CL 100 100 102 103  CO2 '24 22 27 29  ' GLUCOSE 265* 240* 175* 145*  BUN 30* 28* 26* 30*  CREATININE 1.49* 1.35* 1.12* 1.12*  CALCIUM 8.8* 8.5* 8.6* 8.9   Liver Function Tests: Recent Labs  Lab 12/30/20 0453  AST 9*  ALT 10  ALKPHOS 58  BILITOT 0.4  PROT 7.0  ALBUMIN 2.3*   No results for input(s): LIPASE, AMYLASE in the last 168 hours. No results for input(s): AMMONIA in the last 168 hours. CBC: Recent Labs  Lab 12/29/20 0451 12/30/20 0453 12/31/20 0439 01/03/21 0433  WBC 5.7 5.5 5.6 5.8  NEUTROABS 3.4  --   --   --   HGB 11.4* 11.9* 11.5* 11.4*  HCT 35.5* 37.7 36.6 37.4  MCV 91.5 91.3 92.7 94.0  PLT 285 303 296 319   Cardiac Enzymes: No results for input(s): CKTOTAL, CKMB, CKMBINDEX, TROPONINI in the last 168 hours. BNP: BNP (last 3 results) No results for input(s): BNP in the last 8760 hours.  ProBNP (last 3 results) No results for input(s): PROBNP in the last 8760 hours.  CBG: Recent Labs  Lab 01/03/21 1128 01/03/21 1620 01/03/21 2139 01/04/21 0745 01/04/21 1208  GLUCAP 108* 99 146* 143* 85       Signed:  Domenic Polite MD.  [facility] 01/04/2021, 3:05 PM

## 2021-01-04 NOTE — Progress Notes (Signed)
Attempted x2 to insert PICC but unable to pass thru clavicular area. Unable to insert centrally in spite of all measures rendered. As per recommendation, IR to place PICC. RN aware.

## 2021-01-04 NOTE — Progress Notes (Signed)
Progress Note  1 Day Post-Op  Subjective: Patient overall feeling well.  No new complaints   Objective: Vital signs in last 24 hours: Temp:  [97.8 F (36.6 C)-98.7 F (37.1 C)] 97.8 F (36.6 C) (06/14 0427) Pulse Rate:  [61-80] 63 (06/14 0427) Resp:  [11-18] 18 (06/14 0427) BP: (146-172)/(69-84) 146/71 (06/14 0427) SpO2:  [94 %-100 %] 100 % (06/14 0427) Weight:  [182.3 kg] 182.3 kg (06/13 1133) Last BM Date: 01/03/21  Intake/Output from previous day: 06/13 0701 - 06/14 0700 In: 3511.7 [P.O.:2400; I.V.:600; IV Piggyback:511.7] Out: 4101 [Urine:4100; Stool:1] Intake/Output this shift: Total I/O In: 0  Out: 300 [Urine:300]  PE: Ext: LLE wound looks great with some pale granulation tissue starting to developed in wound base.  No further infection or erythema  Lab Results:  Recent Labs    01/03/21 0433  WBC 5.8  HGB 11.4*  HCT 37.4  PLT 319   BMET Recent Labs    01/03/21 0433  NA 136  K 4.8  CL 103  CO2 29  GLUCOSE 145*  BUN 30*  CREATININE 1.12*  CALCIUM 8.9   PT/INR No results for input(s): LABPROT, INR in the last 72 hours. CMP     Component Value Date/Time   NA 136 01/03/2021 0433   K 4.8 01/03/2021 0433   CL 103 01/03/2021 0433   CO2 29 01/03/2021 0433   GLUCOSE 145 (H) 01/03/2021 0433   BUN 30 (H) 01/03/2021 0433   CREATININE 1.12 (H) 01/03/2021 0433   CREATININE 0.70 09/01/2013 0954   CALCIUM 8.9 01/03/2021 0433   PROT 7.0 12/30/2020 0453   ALBUMIN 2.3 (L) 12/30/2020 0453   AST 9 (L) 12/30/2020 0453   ALT 10 12/30/2020 0453   ALKPHOS 58 12/30/2020 0453   BILITOT 0.4 12/30/2020 0453   GFRNONAA >60 01/03/2021 0433   GFRNONAA >89 09/01/2013 0954   GFRAA >89 09/01/2013 0954   Lipase     Component Value Date/Time   LIPASE 20 09/05/2011 0120       Studies/Results: ECHO TEE  Result Date: 01/04/2021    TRANSESOPHOGEAL ECHO REPORT   Patient Name:   Karen Bennett Date of Exam: 01/03/2021 Medical Rec #:  [id number]       Height:        66.0 in Accession #:    [id number]      Weight:       401.9 lb Date of Birth:  1976-07-08      BSA:          2.690 m Patient Age:    44 years        BP:           167/84 mmHg Patient Gender: F               HR:           70 bpm. Exam Location:  Inpatient Procedure: 2D Echo, Cardiac Doppler and Color Doppler Indications:     Bacteremia  History:         Patient has prior history of Echocardiogram examinations, most                  recent 09/06/2012.  Sonographer:     Roosvelt Maser RDCS Referring Phys:  [id number] Manson Passey Diagnosing Phys: [name] MD PROCEDURE: After discussion of the risks and benefits of a TEE, an informed consent was obtained from the patient. The transesophogeal probe was passed without difficulty through the esophogus of  the patient. Imaged were obtained with the patient in a supine position. Local oropharyngeal anesthetic was provided with Cetacaine. Sedation performed by different physician. The patient was monitored while under deep sedation. Anesthestetic sedation was provided intravenously by Anesthesiology: 470mg  of Propofol, 60mg  of Lidocaine. Image quality was excellent. The patient's vital signs; including heart rate, blood pressure, and oxygen saturation; remained stable throughout the procedure. The patient developed no complications during the procedure. IMPRESSIONS  1. Left ventricular ejection fraction, by estimation, is 60 to 65%. The left ventricle has normal function. The left ventricle has no regional wall motion abnormalities.  2. Right ventricular systolic function is normal. The right ventricular size is normal.  3. No left atrial/left atrial appendage thrombus was detected.  4. The mitral valve is abnormal. There is a very thin filamentous highly mobile target that appears to originate from the anterior mitral valve leaflet that is seen only in the 4 and 2 chamber views at 0 degrees and 130 degrees that likely represents a vegetation. Mild to moderate mitral  valve regurgitation. No evidence of mitral stenosis.  5. Tricuspid valve regurgitation is mild to moderate.  6. The aortic valve is tricuspid. Aortic valve regurgitation is trivial. Mild aortic valve sclerosis is present, with no evidence of aortic valve stenosis.  7. The inferior vena cava is normal in size with greater than 50% respiratory variability, suggesting right atrial pressure of 3 mmHg. Conclusion(s)/Recommendation(s): Normal biventricular function with evidence of possible mitral valve vegetation. Findings are concerning for vegetation/infective endocarditis as detailed above. FINDINGS  Left Ventricle: Left ventricular ejection fraction, by estimation, is 60 to 65%. The left ventricle has normal function. The left ventricle has no regional wall motion abnormalities. The left ventricular internal cavity size was normal in size. There is  no left ventricular hypertrophy. Abnormal (paradoxical) septal motion, consistent with left bundle branch block. Right Ventricle: The right ventricular size is normal. No increase in right ventricular wall thickness. Right ventricular systolic function is normal. Left Atrium: Left atrial size was normal in size. No left atrial/left atrial appendage thrombus was detected. Right Atrium: Right atrial size was normal in size. Pericardium: There is no evidence of pericardial effusion. Mitral Valve: There is a very thin filamentous highly mobile target that appears to originate from the anterior mitral valve leaflet that is seen only in the 4 and 2 chamber views at 0 degrees and 130 degrees that likely represents a vegetation. The mitral valve is abnormal. Mild to moderate mitral valve regurgitation. No evidence of mitral valve stenosis. Tricuspid Valve: The tricuspid valve is normal in structure. Tricuspid valve regurgitation is mild to moderate. No evidence of tricuspid stenosis. Aortic Valve: The aortic valve is tricuspid. Aortic valve regurgitation is trivial. Mild aortic  valve sclerosis is present, with no evidence of aortic valve stenosis. Pulmonic Valve: The pulmonic valve was normal in structure. Pulmonic valve regurgitation is mild to moderate. No evidence of pulmonic stenosis. Aorta: The aortic root is normal in size and structure. Venous: The inferior vena cava is normal in size with greater than 50% respiratory variability, suggesting right atrial pressure of 3 mmHg. IAS/Shunts: No atrial level shunt detected by color flow Doppler. MD Electronically signed by MD Signature Date/Time: 01/04/2021/8:47:18 AM    Final    [name] EKG SITE RITE  Result Date: 01/03/2021 If Site Rite image not attached, placement could not be confirmed due to current cardiac rhythm.   Anti-infectives: Anti-infectives (From admission, onward)    Start  Dose/Rate Route Frequency Ordered Stop   01/04/21 0000  cephALEXin (KEFLEX) capsule 500 mg        500 mg Oral Every 6 hours 01/03/21 1509 01/09/21 2359   01/03/21 1600  anidulafungin (ERAXIS) 200 mg in sodium chloride 0.9 % 200 mL IVPB        200 mg 78 mL/hr over 200 Minutes Intravenous Every 24 hours 01/03/21 1509     12/30/20 0400  fluconazole (DIFLUCAN) IVPB 400 mg  Status:  Discontinued        400 mg 100 mL/hr over 120 Minutes Intravenous Every 24 hours 12/29/20 0314 01/03/21 1509   12/29/20 1800  cefTRIAXone (ROCEPHIN) 2 g in sodium chloride 0.9 % 100 mL IVPB  Status:  Discontinued        2 g 200 mL/hr over 30 Minutes Intravenous Every 24 hours 12/29/20 1234 12/29/20 1438   12/29/20 1530  ceFAZolin (ANCEF) IVPB 2g/100 mL premix        2 g 200 mL/hr over 30 Minutes Intravenous Every 8 hours 12/29/20 1438 01/03/21 2159   12/29/20 0400  fluconazole (DIFLUCAN) IVPB 400 mg        400 mg 100 mL/hr over 120 Minutes Intravenous Every 2 hours 12/29/20 0314 12/29/20 0754   12/27/20 2000  DAPTOmycin (CUBICIN) 650 mg in sodium chloride 0.9 % IVPB  Status:  Discontinued        6 mg/kg  108.5 kg (Adjusted) 126  mL/hr over 30 Minutes Intravenous Daily 12/27/20 1229 12/27/20 1454   12/27/20 1000  vancomycin (VANCOREADY) IVPB 2000 mg/400 mL  Status:  Discontinued        2,000 mg 200 mL/hr over 120 Minutes Intravenous Every 12 hours 12/27/20 0049 12/27/20 1218   12/27/20 0923  vancomycin (VANCOCIN) 1-5 GM/200ML-% IVPB       Note to Pharmacy: Ramond CraverSpencer, Brittany   : cabinet override      12/27/20 0923 12/27/20 2129   12/27/20 0923  piperacillin-tazobactam (ZOSYN) 3.375 GM/50ML IVPB       Note to Pharmacy: Ramond CraverSpencer, Brittany   : cabinet override      12/27/20 0923 12/27/20 0928   12/27/20 0200  piperacillin-tazobactam (ZOSYN) IVPB 3.375 g  Status:  Discontinued        3.375 g 12.5 mL/hr over 240 Minutes Intravenous Every 8 hours 12/27/20 0049 12/29/20 1234   12/26/20 2000  vancomycin (VANCOREADY) IVPB 2000 mg/400 mL        2,000 mg 200 mL/hr over 120 Minutes Intravenous Once 12/26/20 1954 12/27/20 0136   12/26/20 1945  piperacillin-tazobactam (ZOSYN) IVPB 3.375 g        3.375 g 100 mL/hr over 30 Minutes Intravenous  Once 12/26/20 1930 12/27/20 0136   12/26/20 1945  vancomycin (VANCOCIN) 2,000 mg in sodium chloride 0.9 % 500 mL IVPB  Status:  Discontinued        2,000 mg 250 mL/hr over 120 Minutes Intravenous  Once 12/26/20 1931 12/26/20 1953        Assessment/Plan POD 8, s/p I&D of L thigh abscess, Dr. 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  Patient is getting at time 2-3 trays per meal.  Candidemia - fluconazole per ID, TTE to r/o endocarditis today   FEN - carb mod diet VTE - Lovenox ID - ancef 6/8>> Follow  up - [facility] clinic   Morbid obesity DM HTN D/O DVT/PE AKI  LOS: 8 days    Letha Cape, [facility] 01/04/2021, 9:40 AM Please see Amion for pager number during day hours  7:00am-4:30pm

## 2021-01-04 NOTE — Progress Notes (Signed)
[facility]    Date of Admission:  12/26/2020     ID: Karen Bennett is a 45 y.o. 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 Awaiting for IR to place central line for IV abtx. Underwent eye exam last night that excluded endophlamitis  ROS: 12 point ros is negative  Medications:   acetaminophen  650 mg Oral Q6H   buPROPion  300 mg Oral Daily   cephALEXin  500 mg Oral Q6H   Chlorhexidine Gluconate Cloth  6 each Topical Daily   collagenase   Topical Daily   heparin lock flush       insulin aspart  0-15 Units Subcutaneous TID WC   insulin aspart  8 Units Subcutaneous TID WC   insulin glargine  45 Units Subcutaneous BID   lidocaine       multivitamin with minerals  1 tablet Oral Daily   nutrition supplement (JUVEN)  1 packet Oral BID BM   nystatin   Topical TID   pantoprazole  40 mg Oral Daily   Ensure Max Protein  11 oz Oral BID   rivaroxaban  20 mg Oral Q supper   sodium chloride flush  10-40 mL Intracatheter Q12H   traZODone  25 mg Oral QHS   venlafaxine XR  75 mg Oral Daily    Objective: Vital signs in last 24 hours: Temp:  [97.7 F (36.5 C)-98 F (36.7 C)] 97.7 F (36.5 C) (06/14 1327) Pulse Rate:  [61-63] 63 (06/14 1327) Resp:  [18] 18 (06/14 1327) BP: (140-147)/(65-71) 140/65 (06/14 1327) SpO2:  [95 %-100 %] 98 % (06/14 1327) Physical Exam  Constitutional:  oriented to person, place, and time. appears well-developed and well-nourished. No distress.  HENT: [place]/AT, PERRLA, no scleral icterus Mouth/Throat: Oropharynx is  clear and moist. No oropharyngeal exudate.  Cardiovascular: Normal rate, regular rhythm and normal heart sounds. Exam reveals no gallop and no friction rub.  No murmur heard.  Pulmonary/Chest: Effort normal and breath sounds normal. No respiratory distress.  has no wheezes.  Neck = supple, no nuchal rigidity Abdominal: Soft. Bowel sounds are normal.  exhibits no distension. There is no tenderness.  Lymphadenopathy: no cervical adenopathy. No axillary adenopathy Neurological: alert and oriented to person, place, and time.  Skin: Skin is warm and dry. Left thigh wound packed/covered  Psychiatric: a normal mood and affect.  behavior is normal.    Lab Results Recent Labs    01/03/21 0433  WBC 5.8  HGB 11.4*  HCT 37.4  NA 136  K 4.8  CL 103  CO2 29  BUN 30*  CREATININE 1.12*     Microbiology: reviewed Studies/Results: ECHO TEE  Result Date: 01/04/2021    TRANSESOPHOGEAL ECHO REPORT   Patient Name:   Karen Bennett Date of Exam: 01/03/2021 Medical Rec #:  [id number]       Height:       66.0 in Accession #:    [id number]      Weight:       401.9 lb Date of  Birth:  1975-12-16      BSA:          2.690 m Patient Age:    44 years        BP:           167/84 mmHg Patient Gender: F               HR:           70 bpm. Exam Location:  Inpatient Procedure: 2D Echo, Cardiac Doppler and Color Doppler Indications:     Bacteremia  History:         Patient has prior history of Echocardiogram examinations, most                  recent 09/06/2012.  Sonographer:     Roosvelt Maser RDCS Referring Phys:  [id number] Manson Passey Diagnosing Phys: [name] MD PROCEDURE: After discussion of the risks and benefits of a TEE, an informed consent was obtained from the patient. The transesophogeal probe was passed without difficulty through the esophogus of the patient. Imaged were obtained with the patient in a supine position. Local oropharyngeal anesthetic was provided with Cetacaine. Sedation performed by  different physician. The patient was monitored while under deep sedation. Anesthestetic sedation was provided intravenously by Anesthesiology: 470mg  of Propofol, 60mg  of Lidocaine. Image quality was excellent. The patient's vital signs; including heart rate, blood pressure, and oxygen saturation; remained stable throughout the procedure. The patient developed no complications during the procedure. IMPRESSIONS  1. Left ventricular ejection fraction, by estimation, is 60 to 65%. The left ventricle has normal function. The left ventricle has no regional wall motion abnormalities.  2. Right ventricular systolic function is normal. The right ventricular size is normal.  3. No left atrial/left atrial appendage thrombus was detected.  4. The mitral valve is abnormal. There is a very thin filamentous highly mobile target that appears to originate from the anterior mitral valve leaflet that is seen only in the 4 and 2 chamber views at 0 degrees and 130 degrees that likely represents a vegetation. Mild to moderate mitral valve regurgitation. No evidence of mitral stenosis.  5. Tricuspid valve regurgitation is mild to moderate.  6. The aortic valve is tricuspid. Aortic valve regurgitation is trivial. Mild aortic valve sclerosis is present, with no evidence of aortic valve stenosis.  7. The inferior vena cava is normal in size with greater than 50% respiratory variability, suggesting right atrial pressure of 3 mmHg. Conclusion(s)/Recommendation(s): Normal biventricular function with evidence of possible mitral valve vegetation. Findings are concerning for vegetation/infective endocarditis as detailed above. FINDINGS  Left Ventricle: Left ventricular ejection fraction, by estimation, is 60 to 65%. The left ventricle has normal function. The left ventricle has no regional wall motion abnormalities. The left ventricular internal cavity size was normal in size. There is  no left ventricular hypertrophy. Abnormal (paradoxical)  septal motion, consistent with left bundle branch block. Right Ventricle: The right ventricular size is normal. No increase in right ventricular wall thickness. Right ventricular systolic function is normal. Left Atrium: Left atrial size was normal in size. No left atrial/left atrial appendage thrombus was detected. Right Atrium: Right atrial size was normal in size. Pericardium: There is no evidence of pericardial effusion. Mitral Valve: There is a very thin filamentous highly mobile target that appears to originate from the anterior mitral valve leaflet that is seen only in the 4 and 2 chamber views at 0 degrees and 130 degrees that likely represents a vegetation.  The mitral valve is abnormal. Mild to moderate mitral valve regurgitation. No evidence of mitral valve stenosis. Tricuspid Valve: The tricuspid valve is normal in structure. Tricuspid valve regurgitation is mild to moderate. No evidence of tricuspid stenosis. Aortic Valve: The aortic valve is tricuspid. Aortic valve regurgitation is trivial. Mild aortic valve sclerosis is present, with no evidence of aortic valve stenosis. Pulmonic Valve: The pulmonic valve was normal in structure. Pulmonic valve regurgitation is mild to moderate. No evidence of pulmonic stenosis. Aorta: The aortic root is normal in size and structure. Venous: The inferior vena cava is normal in size with greater than 50% respiratory variability, suggesting right atrial pressure of 3 mmHg. IAS/Shunts: No atrial level shunt detected by color flow Doppler. [name] MD Electronically signed by [name] MD Signature Date/Time: 01/04/2021/8:47:18 AM    Final    [place] EKG SITE RITE  Result Date: 01/03/2021 If Site Rite image not attached, placement could not be confirmed due to current cardiac rhythm.    Assessment/Plan: C.albicans MV endocarditis = currently on day 2 of echinocandins. We will plan on 6 wks of echinocandins(anidulafungin during hospitalization) and transitioned to  caspofungin at home. Will need teaching on how to infuse. We will follow up and thereafter have her transitioned to oral fluconazole (lifelong)  Will need repeat TTE at end for Iv abtx to evaluate MV function Also recommend to be seen by CT surgery as an outpatient  Klebsiella deep tissue abscess s/p I x D = continue on completion of 1 more week of cephalexin plus wound care dressing daily, with wet to dry plus place a thin film of santyl to fibrinous area for enzymatic debridement.  We will see back in the ID clinic in 2-3 wk.  [facility] Cell: [phone] Pager: [phone]  01/04/2021, 4:54 PM

## 2021-01-04 NOTE — Progress Notes (Signed)
PHARMACY CONSULT NOTE FOR:  OUTPATIENT  PARENTERAL ANTIBIOTIC THERAPY (OPAT)  Indication: candida albicans endocarditis Regimen: caspofungin 150mg  IV q24h End date: 02/14/2021  IV antibiotic discharge orders are pended. To discharging provider:  please sign these orders via discharge navigator,  Select New Orders & click on the button choice - Manage This Unsigned Work.     Thank you for allowing pharmacy to be a part of this patient's care.  02/16/2021 01/04/2021, 11:34 AM

## 2021-01-04 NOTE — Procedures (Signed)
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

## 2021-01-04 NOTE — Anesthesia Postprocedure Evaluation (Signed)
Anesthesia Post Note  Patient: Karen Bennett  Procedure(s) Performed: TRANSESOPHAGEAL ECHOCARDIOGRAM (TEE)     Patient location during evaluation: PACU Anesthesia Type: MAC Level of consciousness: awake and alert Pain management: pain level controlled Vital Signs Assessment: post-procedure vital signs reviewed and stable Respiratory status: spontaneous breathing, nonlabored ventilation, respiratory function stable and patient connected to nasal cannula oxygen Cardiovascular status: stable and blood pressure returned to baseline Postop Assessment: no apparent nausea or vomiting Anesthetic complications: no   No notable events documented.  Last Vitals:  Vitals:   01/04/21 0427 01/04/21 1327  BP: (!) 146/71 140/65  Pulse: 63 63  Resp: 18 18  Temp: 36.6 C 36.5 C  SpO2: 100% 98%    Last Pain:  Vitals:   01/04/21 1817  TempSrc:   PainSc: 8    Pain Goal: Patients Stated Pain Goal: 1 (01/04/21 1817)                 Monice Lundy

## 2021-01-04 NOTE — Care Management Important Message (Signed)
Important Message  Patient Details IM Letter given to the Patient. Name: Karen Bennett MRN: [id number] Date of Birth: 06/08/1976   Medicare Important Message Given:  Yes     [name] 01/04/2021, 1:14 PM

## 2021-01-05 ENCOUNTER — Other Ambulatory Visit (HOSPITAL_COMMUNITY): Payer: Self-pay

## 2021-01-05 DIAGNOSIS — B376 Candidal endocarditis: Secondary | ICD-10-CM

## 2021-01-05 LAB — GLUCOSE, CAPILLARY
Glucose-Capillary: 130 mg/dL — ABNORMAL HIGH (ref 70–99)
Glucose-Capillary: 106 mg/dL — ABNORMAL HIGH (ref 70–99)
Glucose-Capillary: 76 mg/dL (ref 70–99)
Glucose-Capillary: 130 mg/dL — ABNORMAL HIGH (ref 70–99)
Glucose-Capillary: 98 mg/dL (ref 70–99)

## 2021-01-05 MED ORDER — SODIUM CHLORIDE 0.9% FLUSH: 10.0000 mL | INTRAVENOUS | Status: DC | PRN

## 2021-01-05 MED ORDER — MICAFUNGIN SODIUM 50 MG IV SOLR: 150.0000 mg | Freq: Every day | INTRAVENOUS | 0 refills | Status: DC

## 2021-01-05 MED ORDER — SODIUM CHLORIDE 0.9% FLUSH
10.0000 mL | Freq: Two times a day (BID) | INTRAVENOUS | Status: DC
  Administered 2021-01-05: 10 mL

## 2021-01-05 NOTE — TOC Benefit Eligibility Note (Signed)
Patient Advocate Encounter   Received notification that prior authorization for Capofungin Acetate 50 mg solution  is required.   PA submitted on 01/05/2021 Key [id number] Status is pending       Roland Earl, CPhT Pharmacy Patient Advocate Specialist [facility] Antimicrobial Stewardship Team Direct Number: [phone]  Fax: [phone]

## 2021-01-05 NOTE — Discharge Summary (Signed)
Physician Discharge Summary  KASIDEE VOISIN UYQ:[id number] DOB: 11/27/1975 DOA: 12/26/2020  PCP: Merryl Hacker, No  Admit date: 12/26/2020 Discharge date: 01/05/2021  Time spent: 43mnutes  Recommendations for Outpatient Follow-up:  Home health PT and RN, continue daily dressing changes and IV Caspofungin for 6 weeks to be completed on 7/18 Infectious disease Dr. 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 Morbid obesity, BMI of 68 DM (diabetes mellitus), type 2, uncontrolled with complications ([place] Right BKA   Anxiety and depression   Cellulitis   BMI 60.0-69.9, adult ([place]   HTN (hypertension)   History of pulmonary embolism   Pressure injury of skin   Infective endocarditis   Discharge Condition: Stable  Diet recommendation: diabetic  Filed Weights   12/26/20 1843 12/27/20 0716 01/03/21 1133  Weight: (!) 182.3 kg (!) 182.3 kg (!) 182.3 kg    History of present illness:  Morbidly obese 45year old female history of type 2 diabetes mellitus, prior history of DVT/PE, right BKA, super morbid obesity, BMI of 68 presented to the ED with left lower extremity pain rash and blistering . -Noted to have left thigh abscess/necrotizing fasciitis this was confirmed on imaging and underwent I&D on 6/6.  Wound culture grew out Klebsiella and she is treated with IV Ancef.  Subsequently further work-up revealed that she had a Candida albicans fungemia and mitral valve endocarditis.  ID was consulted and placed her on Andifuladungin while hospitalized and then recommended 6 weeks of IV Caspofungin Solution followed by Fluconazole 400 for long-term suppression.  Hospital Course:   Left lateral thigh abscess -Sp I&D 6/6 by Dr. CWindle Guard-Wound culture with Klebsiella -Treated with IV Ancef, completed 9 days inpatient, will  transition to oral Keflex for 6 more days -adamantly declines, will need home health RN supplemented by family/friend assistance to do dressing changes -Close follow-up with [facility] in 1-3 weeks    Candida albicans fungemia/mitral valve endocarditis -Has extensive body folds with MASD and cutaneous Candida infection -Currently on IV fluconazole day 7 -OR cultures from thigh abscess negative for yeast -Appreciate infectious disease input, TEE completed yesterday and this was concerning for vegetation involving the mitral valve leaflet -Repeat blood cultures are negative -Ophthalmology evaluation completed, ruled out endophthalmitis -Dr. SBaxter Flatteryrecommended 6 weeks of IV anidulafungin followed by fluconazole 400 mg daily for long-term suppression -PICC line to be placed yesterday evening -Per ID recommendations she will need a repeat TTE at the end of IV antibiotics to evaluate for mitral valve function and recommend the patient be seen by CT surgery as an outpatient   Uncontrolled hyperglycemia, T2DM. Super Morbid obesity, BMI is 65 -Hemoglobin A1c is 15.5 -CBGs more stable, continue current dose of Lantus and Premeal NovoLog -Needs to lose tremendous amount of weight -uses a motorized wheelchair   Right BKA -Follow up with Ortho as an outpatient    Hypertension -Stable, lisinopril resumed at discharge   Stage II right buttock pressure ulcer present on admission, educated regarding frequent repositioning   Hx of DVT and PE. -She had not been taking rivaroxaban as outpatient, this was resumed by my partner on admission -Remains at high risk for VTE   AKI, hyponatremia. -Resolved, lisinopril resumed     TTE Normal LV size and function Normal RV size and function Normal RA Normal LA and LA appendage with normal emptying velocity Normal TV with mild  to moderate TR Normal PV with mild to moderate PR There is a thin filamentous highly mobile density off the atrial side of the  anterior mitral valve leaflet that is seen only in the 4 and 2 chamber views at 0 degrees and 130 degrees that likely represents a vegetation.  The anterior MV leaflet is thickened and calcified at the leaflet tip extending in the the subvalvular apparatus of the attached chordae tendinae.  There is mild to moderate MR. Trileaflet AV with mild aortic valve sclerosis with no stenosis and trivial AR. Normal interatrial septum with no evidence of shunt by colorflow dopper Normal thoracic and ascending aorta. Mitral valve vegetation is present.   Discharge Exam: Vitals:   01/04/21 2007 01/05/21 0554  BP: (!) 156/72 (!) 156/52  Pulse: 64 (!) 55  Resp: 18 18  Temp: 98.1 F (36.7 C) 98.6 F (37 C)  SpO2: 99% 97%    General: Morbidly obese pleasant female, laying in bed, AAOx3, no distress CVS: S1-S2, regular rate rhythm Lungs: Distant breath sounds Abdomen: Soft, obese, nontender, large abdominal pannus, bowel sounds present Extremities, left lateral thigh with dressing, right BKA  Skin: As above  Discharge Instructions  Discharge Instructions     [facility] pharmacist to adjust dose for Vancomycin, Aminoglycosides and other anti-infective therapies as requested by physician.   Complete by: As directed    [facility] to provide Cath Flo 29m   Complete by: As directed    Administer for PICC line occlusion and as ordered by physician for other access device issues.   Ambulatory referral to Cardiothoracic Surgery   Complete by: As directed    Morbidly obese uncontrolled diabetic with Candida albicans mitral valve endocarditis   Anaphylaxis Kit: Provided to treat any anaphylactic reaction to the medication being provided to the patient if First Dose or when requested by physician   Complete by: As directed    Epinephrine 155mml vial / amp: Administer 0.29m67m0.29ml55mubcutaneously once for moderate to severe anaphylaxis, nurse to call physician and pharmacy when  reaction occurs and call 911 if needed for immediate care   Diphenhydramine 50mg57mIV vial: Administer 25-50mg 54mM PRN for first dose reaction, rash, itching, mild reaction, nurse to call physician and pharmacy when reaction occurs   Sodium Chloride 0.9% NS 500ml I31mdminister if needed for hypovolemic blood pressure drop or as ordered by physician after call to physician with anaphylactic reaction   Change dressing on IV access line weekly and PRN   Complete by: As directed    Flush IV access with Sodium Chloride 0.9% and Heparin 10 units/ml or 100 units/ml   Complete by: As directed    Home infusion instructions - [facility]   Complete by: As directed    Instructions: Flush IV access with Sodium Chloride 0.9% and Heparin 10units/ml or 100units/ml   Change dressing on IV access line: Weekly and PRN   Instructions Cath Flo 2mg: Ad37mister for PICC Line occlusion and as ordered by physician for other access device   [facility] pharmacist to adjust dose for: Vancomycin, Aminoglycosides and other anti-infective therapies as requested by physician   Method of administration may be changed at the discretion of home infusion pharmacist based upon assessment of the patient and/or caregiver's ability to self-administer the medication ordered   Complete by: As directed       Allergies as of 01/05/2021       Reactions   Clindamycin Nausea And Vomiting,  Nausea Only   Zofran Itching, Nausea And Vomiting   Doxycycline Nausea And Vomiting   Morphine And Related Itching        Medication List     STOP taking these medications    aspirin EC 325 MG tablet   ferrous sulfate 325 (65 FE) MG tablet   insulin aspart 100 UNIT/ML injection Commonly known as: novoLOG       TAKE these medications    acetaminophen 325 MG tablet Commonly known as: TYLENOL Take 2 tablets (650 mg total) by mouth every 6 (six) hours.   atorvastatin 40 MG tablet Commonly known as:  LIPITOR Take 1 tablet (40 mg total) by mouth daily at 6 PM.   buPROPion 300 MG 24 hr tablet Commonly known as: WELLBUTRIN XL Take 300 mg by mouth daily.   caspofungin  IVPB Commonly known as: CANCIDAS Inject 150 mg into the vein daily. Indication:  candida albicans endocarditis First Dose: Yes Last Day of Therapy:  02/14/2021 Labs - Once weekly:  CBC/D and BMP, Labs - Every other week:  ESR and CRP Method of administration: Elastomeric Method of administration may be changed at the discretion of home infusion pharmacist based upon assessment of the patient and/or caregiver's ability to self-administer the medication ordered.   cephALEXin 500 MG capsule Commonly known as: KEFLEX Take 1 capsule (500 mg total) by mouth every 6 (six) hours for 6 days.   collagenase ointment Commonly known as: SANTYL Apply topically daily. To left leg   furosemide 40 MG tablet Commonly known as: LASIX Take 40 mg by mouth daily.   Fusion Plus Caps Take 1 capsule by mouth daily.   glucose blood test strip Use as instructed   glucose monitoring kit monitoring kit 1 each by Does not apply route as needed for other.   HumaLOG KwikPen 100 UNIT/ML KwikPen Generic drug: insulin lispro Inject subcutaneously twice daily as per sliding scale: if 70-150= 0 units; 151-200= 2 u; 201-250= 4 u; 251-300= 6 u; 301-350= 8 u; 351-400= 10 u; 401-450= 12u; anything greater than 400 call physician.   hydrOXYzine 50 MG capsule Commonly known as: VISTARIL Take 50 mg by mouth every 6 (six) hours as needed for itching.   insulin glargine 100 UNIT/ML injection Commonly known as: LANTUS Inject 0.45 mLs (45 Units total) into the skin daily. What changed: how much to take   Janumet XR [phone] MG Tb24 Generic drug: SitaGLIPtin-MetFORMIN HCl Take 1 tablet by mouth daily.   lisinopril 2.5 MG tablet Commonly known as: ZESTRIL Take 2.5 mg by mouth daily.   omeprazole 20 MG capsule Commonly known as: PRILOSEC Take  1 capsule by mouth daily.   potassium chloride 10 MEQ tablet Commonly known as: KLOR-CON Take 10 mEq by mouth daily.   Semglee (yfgn) 100 UNIT/ML Sopn Generic drug: Insulin Glargine-yfgn Inject 60 Units into the skin daily.   traZODone 50 MG tablet Commonly known as: DESYREL Take 25 mg by mouth at bedtime.   venlafaxine XR 75 MG 24 hr capsule Commonly known as: EFFEXOR-XR Take 75 mg by mouth daily.   Xarelto 20 MG Tabs tablet Generic drug: rivaroxaban Take 20 mg by mouth daily.               Discharge Care Instructions  (From admission, onward)           Start     Ordered   01/04/21 0000  Change dressing on IV access line weekly and PRN  (Home infusion instructions -  [facility] )        01/04/21 1139           Allergies  Allergen Reactions   Clindamycin Nausea And Vomiting and Nausea Only   Zofran Itching and Nausea And Vomiting   Doxycycline Nausea And Vomiting   Morphine And Related Itching    Follow-up Information     [facility] Follow up on 01/25/2021.   Specialty: General Surgery Why: 9:45 Am, arrive by 9:15am for paperwork and check in process Contact information: [address] [place] [address] [phone]         Carlyle Basques, MD. Go in 1 month(s).   Specialty: Infectious Diseases Contact information: [place] [address] [phone]                  The results of significant diagnostics from this hospitalization (including imaging, microbiology, ancillary and laboratory) are listed below for reference.    Significant Diagnostic Studies: IR Veno/Ext/Uni Right  Result Date: 01/05/2021 INDICATION: 45 year old morbidly obese patient with Candida mitral valve endocarditis. Multiple attempts at bedside PICC placement by the PICC team or unsuccessful due to inability to pass the wire centrally. Patient now referred to interventional radiology for  definitive long-term peripheral venous access. EXAM: RIGHT UPPER EXTREMITY VENOGRAM PTA VEIN RIGHT SUBCLAVIAN PICC LINE PLACEMENT WITH ULTRASOUND AND FLUOROSCOPIC GUIDANCE MEDICATIONS: None. ANESTHESIA/SEDATION: None. FLUOROSCOPY TIME:  Fluoroscopy Time: 4 minutes 48 seconds (231 mGy). COMPLICATIONS: None immediate. PROCEDURE: The patient was advised of the possible risks and complications and agreed to undergo the procedure. The patient was then brought to the angiographic suite for the procedure. The RIGHT arm was prepped with chlorhexidine, draped in the usual sterile fashion using maximum barrier technique (cap and mask, sterile gown, sterile gloves, large sterile sheet, hand hygiene and cutaneous antisepsis) and infiltrated locally with 1% Lidocaine. Ultrasound demonstrated patency of the RIGHT BASILIC vein, and this was documented with an image. Under real-time ultrasound guidance, this vein was accessed with a 21 gauge micropuncture needle and image documentation was performed. A 0.018 wire was introduced in to the vein. The 0.018 guidewire could not be advanced beyond the junction of the clavicle and first rib. Therefore, a right upper extremity venogram was performed through the peel-away sheath. The venogram demonstrates high-grade stenosis of the subclavian vein as it passes between the clavicle and first rib. Multiple collaterals are identified draining into the internal jugular vein. A hydrophilic 3.570 roadrunner wire was next advanced and successfully navigated past the stenosis, down the SVC, through the right heart and into the inferior vena cava. A Mustang 4 x 40 mm low-profile balloon was then advanced over the wire and across the subclavian stenosis. Angioplasty was then performed. The balloon was inflated to full effacement and maintained for 90 seconds. The balloon was removed over the wire. A 5 French single lumen power-injectable PICC was advanced to the lower SVC/right atrial junction.  Fluoroscopy during the procedure and fluoro spot radiograph confirms appropriate catheter position. The catheter was flushed and covered with a sterile dressing. Catheter length: 41 cm IMPRESSION: 1. 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 2. Successful balloon angioplasty of stenosis to 4 mm. 3. Successful right arm Power PICC line placement with ultrasound and fluoroscopic guidance. The catheter is ready for use. Electronically Signed   By: Dellis Filbert.D.  On: 01/05/2021 09:55   IR PTA VENOUS EXCEPT DIALYSIS CIRCUIT  Result Date: 01/05/2021 INDICATION: 45 year old morbidly obese patient with Candida mitral valve endocarditis. Multiple attempts at bedside PICC placement by the PICC team or unsuccessful due to inability to pass the wire centrally. Patient now referred to interventional radiology for definitive long-term peripheral venous access. EXAM: RIGHT UPPER EXTREMITY VENOGRAM PTA VEIN RIGHT SUBCLAVIAN PICC LINE PLACEMENT WITH ULTRASOUND AND FLUOROSCOPIC GUIDANCE MEDICATIONS: None. ANESTHESIA/SEDATION: None. FLUOROSCOPY TIME:  Fluoroscopy Time: 4 minutes 48 seconds (231 mGy). COMPLICATIONS: None immediate. PROCEDURE: The patient was advised of the possible risks and complications and agreed to undergo the procedure. The patient was then brought to the angiographic suite for the procedure. The RIGHT arm was prepped with chlorhexidine, draped in the usual sterile fashion using maximum barrier technique (cap and mask, sterile gown, sterile gloves, large sterile sheet, hand hygiene and cutaneous antisepsis) and infiltrated locally with 1% Lidocaine. Ultrasound demonstrated patency of the RIGHT BASILIC vein, and this was documented with an image. Under real-time ultrasound guidance, this vein was accessed with a 21 gauge micropuncture needle and image documentation was performed. A 0.018 wire was  introduced in to the vein. The 0.018 guidewire could not be advanced beyond the junction of the clavicle and first rib. Therefore, a right upper extremity venogram was performed through the peel-away sheath. The venogram demonstrates high-grade stenosis of the subclavian vein as it passes between the clavicle and first rib. Multiple collaterals are identified draining into the internal jugular vein. A hydrophilic 3.559 roadrunner wire was next advanced and successfully navigated past the stenosis, down the SVC, through the right heart and into the inferior vena cava. A Mustang 4 x 40 mm low-profile balloon was then advanced over the wire and across the subclavian stenosis. Angioplasty was then performed. The balloon was inflated to full effacement and maintained for 90 seconds. The balloon was removed over the wire. A 5 French single lumen power-injectable PICC was advanced to the lower SVC/right atrial junction. Fluoroscopy during the procedure and fluoro spot radiograph confirms appropriate catheter position. The catheter was flushed and covered with a sterile dressing. Catheter length: 41 cm IMPRESSION: 1. 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 2. Successful balloon angioplasty of stenosis to 4 mm. 3. Successful right arm Power PICC line placement with ultrasound and fluoroscopic guidance. The catheter is ready for use. Electronically Signed   By: Jacqulynn Cadet M.D.   On: 01/05/2021 09:55   ECHO TEE  Result Date: 01/04/2021    TRANSESOPHOGEAL ECHO REPORT   Patient Name:   EMMALISE HUARD Date of Exam: 01/03/2021 Medical Rec #:  [id number]       Height:       66.0 in Accession #:    [id number]      Weight:       401.9 lb Date of Birth:  01-11-1976      BSA:          2.690 m Patient Age:    45 years        BP:           167/84 mmHg Patient Gender: F               HR:           70 bpm. Exam Location:   Inpatient Procedure: 2D Echo, Cardiac Doppler and Color Doppler Indications:     Bacteremia  History:         Patient has prior history of Echocardiogram examinations, most                  recent 09/06/2012.  Sonographer:     Merrie Roof RDCS Referring Phys:  [id number] Leanor Kail Diagnosing Phys: Fransico Him MD PROCEDURE: After discussion of the risks and benefits of a TEE, an informed consent was obtained from the patient. The transesophogeal probe was passed without difficulty through the esophogus of the patient. Imaged were obtained with the patient in a supine position. Local oropharyngeal anesthetic was provided with Cetacaine. Sedation performed by different physician. The patient was monitored while under deep sedation. Anesthestetic sedation was provided intravenously by Anesthesiology: 469m of Propofol, 672mof Lidocaine. Image quality was excellent. The patient's vital signs; including heart rate, blood pressure, and oxygen saturation; remained stable throughout the procedure. The patient developed no complications during the procedure. IMPRESSIONS  1. Left ventricular ejection fraction, by estimation, is 60 to 65%. The left ventricle has normal function. The left ventricle has no regional wall motion abnormalities.  2. Right ventricular systolic function is normal. The right ventricular size is normal.  3. No left atrial/left atrial appendage thrombus was detected.  4. The mitral valve is abnormal. There is a very thin filamentous highly mobile target that appears to originate from the anterior mitral valve leaflet that is seen only in the 4 and 2 chamber views at 0 degrees and 130 degrees that likely represents a vegetation. Mild to moderate mitral valve regurgitation. No evidence of mitral stenosis.  5. Tricuspid valve regurgitation is mild to moderate.  6. The aortic valve is tricuspid. Aortic valve regurgitation is trivial. Mild aortic valve sclerosis is present, with no evidence of aortic  valve stenosis.  7. The inferior vena cava is normal in size with greater than 50% respiratory variability, suggesting right atrial pressure of 3 mmHg. Conclusion(s)/Recommendation(s): Normal biventricular function with evidence of possible mitral valve vegetation. Findings are concerning for vegetation/infective endocarditis as detailed above. FINDINGS  Left Ventricle: Left ventricular ejection fraction, by estimation, is 60 to 65%. The left ventricle has normal function. The left ventricle has no regional wall motion abnormalities. The left ventricular internal cavity size was normal in size. There is  no left ventricular hypertrophy. Abnormal (paradoxical) septal motion, consistent with left bundle branch block. Right Ventricle: The right ventricular size is normal. No increase in right ventricular wall thickness. Right ventricular systolic function is normal. Left Atrium: Left atrial size was normal in size. No left atrial/left atrial appendage thrombus was detected. Right Atrium: Right atrial size was normal in size. Pericardium: There is no evidence of pericardial effusion. Mitral Valve: There is a very thin filamentous highly mobile target that appears to originate from the anterior mitral valve leaflet that is seen only in the 4 and 2 chamber views at 0 degrees and 130 degrees that likely represents a vegetation. The mitral valve is abnormal. Mild to moderate mitral valve regurgitation. No evidence of mitral valve stenosis. Tricuspid Valve: The tricuspid valve is normal in structure. Tricuspid valve regurgitation is mild to moderate. No evidence of tricuspid stenosis. Aortic Valve: The aortic valve is tricuspid. Aortic valve regurgitation is trivial. Mild aortic valve sclerosis is present, with no evidence of aortic valve stenosis. Pulmonic Valve: The pulmonic valve was normal in structure. Pulmonic valve regurgitation is mild to moderate. No evidence of pulmonic stenosis. Aorta: The aortic root is normal in  size and structure. Venous: The  inferior vena cava is normal in size with greater than 50% respiratory variability, suggesting right atrial pressure of 3 mmHg. IAS/Shunts: No atrial level shunt detected by color flow Doppler. Fransico Him MD Electronically signed by Fransico Him MD Signature Date/Time: 01/04/2021/8:47:18 AM    Final    CT EXTREMITY LOWER LEFT W CONTRAST  Result Date: 12/26/2020 CLINICAL DATA:  Soft tissue infection suspected, lower leg, xray done L leg infection just proximal to the knee EXAM: CT OF THE LOWER LEFT EXTREMITY (down to the knee) WITH CONTRAST TECHNIQUE: Multidetector CT imaging of the lower left extremity (down to the knee) was performed according to the standard protocol following intravenous contrast administration. CONTRAST:  181m OMNIPAQUE IOHEXOL 300 MG/ML  SOLN COMPARISON:  None. FINDINGS: Bones/Joint/Cartilage No cortical erosion or destruction. No acute displaced fracture or dislocation. Mild to moderate tricompartmental degenerative changes of the knee. Trace left knee effusion. No severe arthropathy of the hip. Ligaments Suboptimally assessed by CT. Muscles and Tendons Atrophic musculature. Question mild edema along the deep fascial layers (4:224-226.) Soft tissues Subcutaneus soft tissue edema and emphysema along the proximal to mid anterolateral left thigh subcutaneus soft tissues. Overlying dermal thickening. No organized fluid collection. Subcutaneus soft tissue edema and dermal thickening along the posteromedial left thigh and visualized posteromedial leg. Other: At least moderate calcified atherosclerotic plaque of the arteries with limited evaluation due to timing of contrast. Varicose veins noted. IMPRESSION: 1. Subcutaneus soft tissue edema and emphysema of the left thigh. Findings consistent with infection with concern for a necrotizing fasciitis. Please correlate clinically as this is a clinical diagnosis. 2. No organized fluid collection. 3. Atherosclerotic  plaque. 4. Please note the left leg was not imaged (only thigh). These results were called by telephone at the time of interpretation on 12/26/2020 at 10:31 pm [facility] who verbally acknowledged these results. Electronically Signed   By: MIven FinnM.D.   On: 12/26/2020 22:35   IR PICC PLACEMENT RIGHT >5 YRS INC IMG GUIDE  Result Date: 01/05/2021 INDICATION: 45year old morbidly obese patient with Candida mitral valve endocarditis. Multiple attempts at bedside PICC placement by the PICC team or unsuccessful due to inability to pass the wire centrally. Patient now referred to interventional radiology for definitive long-term peripheral venous access. EXAM: RIGHT UPPER EXTREMITY VENOGRAM PTA VEIN RIGHT SUBCLAVIAN PICC LINE PLACEMENT WITH ULTRASOUND AND FLUOROSCOPIC GUIDANCE MEDICATIONS: None. ANESTHESIA/SEDATION: None. FLUOROSCOPY TIME:  Fluoroscopy Time: 4 minutes 48 seconds (231 mGy). COMPLICATIONS: None immediate. PROCEDURE: The patient was advised of the possible risks and complications and agreed to undergo the procedure. The patient was then brought to the angiographic suite for the procedure. The RIGHT arm was prepped with chlorhexidine, draped in the usual sterile fashion using maximum barrier technique (cap and mask, sterile gown, sterile gloves, large sterile sheet, hand hygiene and cutaneous antisepsis) and infiltrated locally with 1% Lidocaine. Ultrasound demonstrated patency of the RIGHT BASILIC vein, and this was documented with an image. Under real-time ultrasound guidance, this vein was accessed with a 21 gauge micropuncture needle and image documentation was performed. A 0.018 wire was introduced in to the vein. The 0.018 guidewire could not be advanced beyond the junction of the clavicle and first rib. Therefore, a right upper extremity venogram was performed through the peel-away sheath. 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 Multiple collaterals are identified draining into the internal jugular vein. A hydrophilic 08.588roadrunner wire was next advanced  and successfully navigated past the stenosis, down the SVC, through the right heart and into the inferior vena cava. A Mustang 4 x 40 mm low-profile balloon was then advanced over the wire and across the subclavian stenosis. Angioplasty was then performed. The balloon was inflated to full effacement and maintained for 90 seconds. The balloon was removed over the wire. A 5 French single lumen power-injectable PICC was advanced to the lower SVC/right atrial junction. Fluoroscopy during the procedure and fluoro spot radiograph confirms appropriate catheter position. The catheter was flushed and covered with a sterile dressing. Catheter length: 41 cm IMPRESSION: 1. 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 2. Successful balloon angioplasty of stenosis to 4 mm. 3. Successful right arm Power PICC line placement with ultrasound and fluoroscopic guidance. The catheter is ready for use. Electronically Signed   By: Jacqulynn Cadet M.D.   On: 01/05/2021 09:55   [place] EKG SITE RITE  Result Date: 01/03/2021 If Site Rite image not attached, placement could not be confirmed due to current cardiac rhythm.   Microbiology: Recent Results (from the past 240 hour(s))  Blood culture (routine x 2)     Status: Abnormal (Preliminary result)   Collection Time: 12/26/20  6:56 PM   Specimen: BLOOD RIGHT ARM  Result Value Ref Range Status   Specimen Description   Final    BLOOD RIGHT ARM Performed at [facility], [address], [place], [place] [address]    Special Requests   Final    BOTTLES DRAWN AEROBIC AND ANAEROBIC Blood Culture adequate volume Performed at [facility] [address] [place], [place] [address]    Culture   Setup Time (A)  Final    YEAST AEROBIC BOTTLE ONLY CRITICAL RESULT CALLED TO, READ BACK BY AND VERIFIED WITH: [place] L. [phone] FCP     Culture (A)  Final    CANDIDA ALBICANS Sent to [facility] for further susceptibility testing. Performed at [facility], [place] [address], [place], [place] [address]    Report Status PENDING  Incomplete  Blood Culture ID Panel (Reflexed)     Status: Abnormal   Collection Time: 12/26/20  6:56 PM  Result Value Ref Range Status   Enterococcus faecalis NOT DETECTED NOT DETECTED Final   Enterococcus Faecium NOT DETECTED NOT DETECTED Final   Listeria monocytogenes NOT DETECTED NOT DETECTED Final   Staphylococcus species NOT DETECTED NOT DETECTED Final   Staphylococcus aureus (BCID) NOT DETECTED NOT DETECTED Final   Staphylococcus epidermidis NOT DETECTED NOT DETECTED Final   Staphylococcus lugdunensis NOT DETECTED NOT DETECTED Final   Streptococcus species NOT DETECTED NOT DETECTED Final   Streptococcus agalactiae NOT DETECTED NOT DETECTED Final   Streptococcus pneumoniae NOT DETECTED NOT DETECTED Final   Streptococcus pyogenes NOT DETECTED NOT DETECTED Final   A.calcoaceticus-baumannii NOT DETECTED NOT DETECTED Final   Bacteroides fragilis NOT DETECTED NOT DETECTED Final   Enterobacterales NOT DETECTED NOT DETECTED Final   Enterobacter cloacae complex NOT DETECTED NOT DETECTED Final   Escherichia coli NOT DETECTED NOT DETECTED Final   Klebsiella aerogenes NOT DETECTED NOT DETECTED Final   Klebsiella oxytoca NOT DETECTED NOT DETECTED Final   Klebsiella pneumoniae NOT DETECTED NOT DETECTED Final   Proteus species NOT DETECTED NOT DETECTED Final   Salmonella species NOT DETECTED NOT DETECTED Final   Serratia marcescens NOT DETECTED NOT DETECTED Final   Haemophilus influenzae  NOT DETECTED NOT DETECTED Final   Neisseria meningitidis NOT DETECTED NOT DETECTED Final   Pseudomonas aeruginosa NOT DETECTED NOT DETECTED Final   Stenotrophomonas  maltophilia NOT DETECTED NOT DETECTED Final   Candida albicans DETECTED (A) NOT DETECTED Final    Comment: CRITICAL RESULT CALLED TO, READ BACK BY AND VERIFIED WITH: PHARMD MICHELLE L. [phone] FCP     Candida auris NOT DETECTED NOT DETECTED Final   Candida glabrata NOT DETECTED NOT DETECTED Final   Candida krusei NOT DETECTED NOT DETECTED Final   Candida parapsilosis NOT DETECTED NOT DETECTED Final   Candida tropicalis NOT DETECTED NOT DETECTED Final   Cryptococcus neoformans/gattii NOT DETECTED NOT DETECTED Final    Comment: Performed at [facility] [address] [place], [place] [address]  Antifungal AST 9 Drug Panel     Status: None   Collection Time: 12/26/20  6:56 PM  Result Value Ref Range Status   Organism ID, Yeast Candida albicans  Corrected    Comment: (NOTE) Identification performed by account, not confirmed by this laboratory. CORRECTED ON 06/13 AT 1236: PREVIOUSLY REPORTED AS Preliminary report    Amphotericin B MIC 0.5 ug/mL  Final    Comment: (NOTE) *Breakpoints have been established for only some organism-drug combinations as indicated. Results of this test are labeled for research purposes only by the assay's manufacturer. The performance characteristics of this assay have not been established by the manufacturer. The result should not be used for treatment or for diagnostic purposes without confirmation of the diagnosis by another medically established diagnostic product or procedure. The performance characteristics were determined by [facility].    Anidulafungin MIC Comment  Final    Comment: (NOTE) 0.03 ug/mL Susceptible *Breakpoints have been established for only some organism-drug combinations as indicated. Results of this test are labeled for research purposes only by the assay's manufacturer. The performance characteristics of this assay have not been established by the manufacturer. The result should not be used for treatment or for  diagnostic purposes without confirmation of the diagnosis by another medically established diagnostic product or procedure. The performance characteristics were determined by [facility].    Caspofungin MIC Comment  Final    Comment: 0.12 ug/mL Susceptible   Micafungin MIC Comment  Final    Comment: 0.016 ug/mL Susceptible   Posaconazole MIC 0.03 ug/mL  Final    Comment: (NOTE) *Breakpoints have been established for only some organism-drug combinations as indicated. Results of this test are labeled for research purposes only by the assay's manufacturer. The performance characteristics of this assay have not been established by the manufacturer. The result should not be used for treatment or for diagnostic purposes without confirmation of the diagnosis by another medically established diagnostic product or procedure. The performance characteristics were determined by [facility].    Fluconazole Islt MIC 0.5 ug/mL Susceptible  Final   Flucytosine MIC 0.5 ug/mL  Final   Itraconazole MIC 0.06 ug/mL  Final   Voriconazole MIC Comment  Final    Comment: (NOTE) 0.016 ug/mL Susceptible Performed At: [facility] [place], [place] [id number] Rush Farmer MD JS:[id number]    Source [phone]  Final    Comment: Performed at [facility], [place] [address], [place], [place] [address]  Resp Panel by RT-PCR (Flu A&B, Covid) Nasopharyngeal Swab     Status: None   Collection Time: 12/26/20 10:33 PM   Specimen: Nasopharyngeal Swab; Nasopharyngeal(NP) swabs in vial transport medium  Result Value Ref Range Status  SARS Coronavirus 2 by RT PCR NEGATIVE NEGATIVE Final    Comment: (NOTE) SARS-CoV-2 target nucleic acids are NOT DETECTED.  The SARS-CoV-2 RNA is generally detectable in upper respiratory specimens during the acute phase of infection. The lowest concentration of SARS-CoV-2 viral copies this assay can detect is 138 copies/mL. A negative result does not preclude  SARS-Cov-2 infection and should not be used as the sole basis for treatment or other patient management decisions. A negative result may occur with  improper specimen collection/handling, submission of specimen other than nasopharyngeal swab, presence of viral mutation(s) within the areas targeted by this assay, and inadequate number of viral copies(<138 copies/mL). A negative result must be combined with clinical observations, patient history, and epidemiological information. The expected result is Negative.  Fact Sheet for Patients:  EntrepreneurPulse.com.au  Fact Sheet for Healthcare Providers:  IncredibleEmployment.be  This test is no t yet approved or cleared by the [place] FDA and  has been authorized for detection and/or diagnosis of SARS-CoV-2 by FDA under an Emergency Use Authorization (EUA). This EUA will remain  in effect (meaning this test can be used) for the duration of the COVID-19 declaration under Section 564(b)(1) of the Act, 21 U.S.C.section 360bbb-3(b)(1), unless the authorization is terminated  or revoked sooner.       Influenza A by PCR NEGATIVE NEGATIVE Final   Influenza B by PCR NEGATIVE NEGATIVE Final    Comment: (NOTE) The Xpert Xpress SARS-CoV-2/FLU/RSV plus assay is intended as an aid in the diagnosis of influenza from Nasopharyngeal swab specimens and should not be used as a sole basis for treatment. Nasal washings and aspirates are unacceptable for Xpert Xpress SARS-CoV-2/FLU/RSV testing.  Fact Sheet for Patients: EntrepreneurPulse.com.au  Fact Sheet for Healthcare Providers: IncredibleEmployment.be  This test is not yet approved or cleared by the [place] FDA and has been authorized for detection and/or diagnosis of SARS-CoV-2 by FDA under an Emergency Use Authorization (EUA). This EUA will remain in effect (meaning this test can be used) for the duration of  the COVID-19 declaration under Section 564(b)(1) of the Act, 21 U.S.C. section 360bbb-3(b)(1), unless the authorization is terminated or revoked.  Performed at [facility], [place] [address], [place], [place] [address]   Blood culture (routine x 2)     Status: None   Collection Time: 12/27/20  2:15 AM   Specimen: BLOOD  Result Value Ref Range Status   Specimen Description   Final    BLOOD SITE NOT SPECIFIED Performed at [facility] [address] [place], [place] [address]    Special Requests   Final    BOTTLES DRAWN AEROBIC AND ANAEROBIC Blood Culture adequate volume Performed at [facility] [address] [place], [place] [address]    Culture   Final    NO GROWTH 5 DAYS Performed at [facility], [place] [address] [place], [place] [address]    Report Status 01/01/2021 FINAL  Final  Aerobic/Anaerobic Culture w Gram Stain (surgical/deep wound)     Status: None   Collection Time: 12/27/20  8:37 AM   Specimen: Abscess  Result Value Ref Range Status   Specimen Description   Final    ABSCESS LEFT THIGH Performed at [facility] [address] [place], [place] [address]    Special Requests   Final    NONE Performed at [facility], [place] [address] [place], [place] [address]    Gram Stain  Final    FEW WBC PRESENT, PREDOMINANTLY PMN FEW GRAM NEGATIVE RODS    Culture   Final    ABUNDANT KLEBSIELLA PNEUMONIAE NO ANAEROBES ISOLATED Performed at [facility], [address] [place], [place] [address]    Report Status 01/01/2021 FINAL  Final   Organism ID, Bacteria KLEBSIELLA PNEUMONIAE  Final      Susceptibility   Klebsiella pneumoniae - MIC*    AMPICILLIN >=32 RESISTANT Resistant     CEFAZOLIN <=4 SENSITIVE Sensitive     CEFEPIME <=0.12 SENSITIVE Sensitive     CEFTAZIDIME <=1 SENSITIVE Sensitive     CEFTRIAXONE <=0.25 SENSITIVE Sensitive     CIPROFLOXACIN  <=0.25 SENSITIVE Sensitive     GENTAMICIN <=1 SENSITIVE Sensitive     IMIPENEM <=0.25 SENSITIVE Sensitive     TRIMETH/SULFA <=20 SENSITIVE Sensitive     AMPICILLIN/SULBACTAM 4 SENSITIVE Sensitive     PIP/TAZO <=4 SENSITIVE Sensitive     * ABUNDANT KLEBSIELLA PNEUMONIAE  Culture, blood (routine x 2)     Status: None   Collection Time: 12/30/20  7:53 AM   Specimen: Right Antecubital; Blood  Result Value Ref Range Status   Specimen Description   Final    RIGHT ANTECUBITAL Performed at [facility] [address], [place], [place] [address]    Special Requests   Final    BOTTLES DRAWN AEROBIC AND ANAEROBIC Blood Culture adequate volume Performed at [facility] [address], [place], [place] [address]    Culture   Final    NO GROWTH 5 DAYS Performed at [facility], [place] [address] [place], [place] [address]    Report Status 01/04/2021 FINAL  Final  Culture, blood (routine x 2)     Status: None   Collection Time: 12/30/20  7:53 AM   Specimen: Left Antecubital; Blood  Result Value Ref Range Status   Specimen Description   Final    LEFT ANTECUBITAL Performed at [facility] [address], [place], [place] [address]    Special Requests   Final    BOTTLES DRAWN AEROBIC ONLY Blood Culture adequate volume Performed at [facility] [address] [place], [place] [address]    Culture   Final    NO GROWTH 5 DAYS Performed at [facility], [place] [address], [place], [place] [address]    Report Status 01/04/2021 FINAL  Final     Labs: Basic Metabolic Panel: Recent Labs  Lab 12/30/20 0453 12/31/20 0439 01/03/21 0433  NA 130* 135 136  K 3.9 4.2 4.8  CL 100 102 103  CO2 '22 27 29  ' GLUCOSE 240* 175* 145*  BUN 28* 26* 30*  CREATININE 1.35* 1.12* 1.12*  CALCIUM 8.5* 8.6* 8.9   Liver Function Tests: Recent Labs  Lab 12/30/20 0453  AST 9*  ALT 10  ALKPHOS 58  BILITOT 0.4  PROT 7.0   ALBUMIN 2.3*   No results for input(s): LIPASE, AMYLASE in the last 168 hours. No results for input(s): AMMONIA in the last 168 hours. CBC: Recent Labs  Lab 12/30/20 0453 12/31/20 0439 01/03/21 0433  WBC 5.5 5.6 5.8  HGB 11.9* 11.5* 11.4*  HCT 37.7 36.6 37.4  MCV 91.3 92.7 94.0  PLT 303 296 319   Cardiac Enzymes: No results for input(s): CKTOTAL, CKMB, CKMBINDEX, TROPONINI in the last 168 hours. BNP: BNP (last 3 results) No results for input(s): BNP in the last 8760 hours.  ProBNP (  last 3 results) No results for input(s): PROBNP in the last 8760 hours.  CBG: Recent Labs  Lab 01/04/21 1208 01/04/21 1749 01/04/21 2110 01/05/21 0738 01/05/21 1135  GLUCAP 85 145* 192* 130* 106*       Signed:  Kerney Elbe MD.  [facility] 01/05/2021, 11:59 AM

## 2021-01-05 NOTE — TOC Transition Note (Signed)
Transition of Care [facility]) - CM/SW Discharge Note   Patient Details  Name: Karen Bennett MRN: [id number] Date of Birth: [birth year]  [facility] CM/SW Contact:  Amada Jupiter, LCSW Phone Number: 01/05/2021, 2:52 PM   Clinical Narrative:    Pt medically cleared for dc home today.  Friend completing final education for wound care (abx teaching done yesterday).  HH set up (see below).  [facility] called at 1450 for pt's transport home. No further TOC needs.   Final next level of care: Home w Home Health Services Barriers to Discharge: Barriers Resolved   Patient Goals and CMS Choice Patient states their goals for this hospitalization and ongoing recovery are:: to go home CMS Medicare.gov Compare Post Acute Care list provided to:: Patient Choice offered to / list presented to : Patient  Discharge Placement                       Discharge Plan and Services   Discharge Planning Services: CM Consult                      HH Arranged: IV Antibiotics, PT, RN HH Agency: [facility] Agency Contacted: 01/04/21   Representative spoke with at [facility] Agency: [facility] - Annalee Genta;  [facility] - Jeri Modena  Social Determinants of Health (SDOH) Interventions     Readmission Risk Interventions Readmission Risk Prevention Plan 01/05/2021  Transportation Screening Complete  PCP or Specialist Appt within 5-7 Days Complete  Home Care Screening Complete  Some recent data might be hidden

## 2021-01-05 NOTE — TOC Benefit Eligibility Note (Signed)
Patient Advocate Encounter  Received notification that the request for prior authorization for Capofungin Acetate 50 mg solutionhas been denied      Roland Earl, CPhT Pharmacy Patient Advocate Specialist [facility] Health Antimicrobial Stewardship Team Direct Number: [phone]  Fax: [phone]

## 2021-01-06 LAB — CULTURE, BLOOD (ROUTINE X 2): Special Requests: ADEQUATE

## 2021-01-07 ENCOUNTER — Telehealth: Payer: Self-pay

## 2021-01-07 NOTE — Telephone Encounter (Signed)
Tresa Endo with [facility] called to let our office know that the patient missed her first dose of IV caspofungin in the home. Patient's medication was delivered to the wrong house. The nurse was able to start IV medication in the home today.

## 2021-01-12 DIAGNOSIS — E782 Mixed hyperlipidemia: Secondary | ICD-10-CM | POA: Insufficient documentation

## 2021-01-19 ENCOUNTER — Telehealth: Payer: Self-pay

## 2021-01-19 NOTE — Telephone Encounter (Signed)
Received call from Lawson at [facility] stating that they will most likely not be able to get the patient's labs drawn on 01/24/21 due to the holiday. Advised her that as long as they are drawn by 01/25/21 that should be fine.   Sandie Ano, RN

## 2021-01-20 NOTE — Telephone Encounter (Signed)
I think ok to delay.

## 2021-02-01 ENCOUNTER — Inpatient Hospital Stay (HOSPITAL_COMMUNITY)
Admission: EM | Admit: 2021-02-01 | Discharge: 2021-02-09 | DRG: 314 | Disposition: A | Discharge status: Home Health | Payer: Medicare Other | Attending: Internal Medicine | Admitting: Internal Medicine

## 2021-02-01 ENCOUNTER — Telehealth: Payer: Self-pay

## 2021-02-01 ENCOUNTER — Emergency Department (HOSPITAL_COMMUNITY): Payer: Medicare Other

## 2021-02-01 ENCOUNTER — Encounter (HOSPITAL_COMMUNITY): Payer: Self-pay

## 2021-02-01 DIAGNOSIS — Z89511 Acquired absence of right leg below knee: Secondary | ICD-10-CM

## 2021-02-01 DIAGNOSIS — Z28310 Unvaccinated for covid-19: Secondary | ICD-10-CM

## 2021-02-01 DIAGNOSIS — R7881 Bacteremia: Secondary | ICD-10-CM | POA: Diagnosis not present

## 2021-02-01 DIAGNOSIS — E876 Hypokalemia: Secondary | ICD-10-CM | POA: Diagnosis not present

## 2021-02-01 DIAGNOSIS — I700 Atherosclerosis of aorta: Secondary | ICD-10-CM | POA: Diagnosis present

## 2021-02-01 DIAGNOSIS — Z86718 Personal history of other venous thrombosis and embolism: Secondary | ICD-10-CM

## 2021-02-01 DIAGNOSIS — B376 Candidal endocarditis: Secondary | ICD-10-CM | POA: Diagnosis present

## 2021-02-01 DIAGNOSIS — E119 Type 2 diabetes mellitus without complications: Secondary | ICD-10-CM | POA: Diagnosis present

## 2021-02-01 DIAGNOSIS — I452 Bifascicular block: Secondary | ICD-10-CM | POA: Diagnosis present

## 2021-02-01 DIAGNOSIS — F419 Anxiety disorder, unspecified: Secondary | ICD-10-CM | POA: Diagnosis present

## 2021-02-01 DIAGNOSIS — M1712 Unilateral primary osteoarthritis, left knee: Secondary | ICD-10-CM | POA: Diagnosis present

## 2021-02-01 DIAGNOSIS — Z794 Long term (current) use of insulin: Secondary | ICD-10-CM

## 2021-02-01 DIAGNOSIS — Z7901 Long term (current) use of anticoagulants: Secondary | ICD-10-CM

## 2021-02-01 DIAGNOSIS — Z6841 Body Mass Index (BMI) 40.0 and over, adult: Secondary | ICD-10-CM

## 2021-02-01 DIAGNOSIS — Z20822 Contact with and (suspected) exposure to covid-19: Secondary | ICD-10-CM | POA: Diagnosis present

## 2021-02-01 DIAGNOSIS — Z888 Allergy status to other drugs, medicaments and biological substances status: Secondary | ICD-10-CM

## 2021-02-01 DIAGNOSIS — E1122 Type 2 diabetes mellitus with diabetic chronic kidney disease: Secondary | ICD-10-CM | POA: Diagnosis present

## 2021-02-01 DIAGNOSIS — Z8249 Family history of ischemic heart disease and other diseases of the circulatory system: Secondary | ICD-10-CM

## 2021-02-01 DIAGNOSIS — N1831 Chronic kidney disease, stage 3a: Secondary | ICD-10-CM | POA: Diagnosis present

## 2021-02-01 DIAGNOSIS — I330 Acute and subacute infective endocarditis: Secondary | ICD-10-CM | POA: Diagnosis present

## 2021-02-01 DIAGNOSIS — I2782 Chronic pulmonary embolism: Secondary | ICD-10-CM | POA: Diagnosis present

## 2021-02-01 DIAGNOSIS — I2699 Other pulmonary embolism without acute cor pulmonale: Secondary | ICD-10-CM | POA: Diagnosis present

## 2021-02-01 DIAGNOSIS — L02416 Cutaneous abscess of left lower limb: Secondary | ICD-10-CM | POA: Diagnosis present

## 2021-02-01 DIAGNOSIS — Z1621 Resistance to vancomycin: Secondary | ICD-10-CM | POA: Diagnosis present

## 2021-02-01 DIAGNOSIS — I708 Atherosclerosis of other arteries: Secondary | ICD-10-CM | POA: Diagnosis present

## 2021-02-01 DIAGNOSIS — I152 Hypertension secondary to endocrine disorders: Secondary | ICD-10-CM | POA: Diagnosis present

## 2021-02-01 DIAGNOSIS — Y848 Other medical procedures as the cause of abnormal reaction of the patient, or of later complication, without mention of misadventure at the time of the procedure: Secondary | ICD-10-CM | POA: Diagnosis present

## 2021-02-01 DIAGNOSIS — IMO0002 Reserved for concepts with insufficient information to code with codable children: Secondary | ICD-10-CM | POA: Diagnosis present

## 2021-02-01 DIAGNOSIS — E1165 Type 2 diabetes mellitus with hyperglycemia: Secondary | ICD-10-CM | POA: Diagnosis present

## 2021-02-01 DIAGNOSIS — A419 Sepsis, unspecified organism: Secondary | ICD-10-CM | POA: Diagnosis present

## 2021-02-01 DIAGNOSIS — I2510 Atherosclerotic heart disease of native coronary artery without angina pectoris: Secondary | ICD-10-CM | POA: Diagnosis present

## 2021-02-01 DIAGNOSIS — I10 Essential (primary) hypertension: Secondary | ICD-10-CM | POA: Diagnosis present

## 2021-02-01 DIAGNOSIS — I129 Hypertensive chronic kidney disease with stage 1 through stage 4 chronic kidney disease, or unspecified chronic kidney disease: Secondary | ICD-10-CM | POA: Diagnosis present

## 2021-02-01 DIAGNOSIS — Z79899 Other long term (current) drug therapy: Secondary | ICD-10-CM

## 2021-02-01 DIAGNOSIS — T80219A Unspecified infection due to central venous catheter, initial encounter: Secondary | ICD-10-CM | POA: Diagnosis not present

## 2021-02-01 DIAGNOSIS — A4152 Sepsis due to Pseudomonas: Secondary | ICD-10-CM | POA: Diagnosis not present

## 2021-02-01 DIAGNOSIS — F32A Depression, unspecified: Secondary | ICD-10-CM | POA: Diagnosis present

## 2021-02-01 DIAGNOSIS — Z7984 Long term (current) use of oral hypoglycemic drugs: Secondary | ICD-10-CM

## 2021-02-01 DIAGNOSIS — T80211A Bloodstream infection due to central venous catheter, initial encounter: Principal | ICD-10-CM | POA: Diagnosis present

## 2021-02-01 DIAGNOSIS — Z885 Allergy status to narcotic agent status: Secondary | ICD-10-CM

## 2021-02-01 DIAGNOSIS — I771 Stricture of artery: Secondary | ICD-10-CM | POA: Diagnosis present

## 2021-02-01 DIAGNOSIS — E1142 Type 2 diabetes mellitus with diabetic polyneuropathy: Secondary | ICD-10-CM | POA: Diagnosis present

## 2021-02-01 DIAGNOSIS — B377 Candidal sepsis: Secondary | ICD-10-CM | POA: Diagnosis present

## 2021-02-01 DIAGNOSIS — Z833 Family history of diabetes mellitus: Secondary | ICD-10-CM

## 2021-02-01 DIAGNOSIS — R9431 Abnormal electrocardiogram [ECG] [EKG]: Secondary | ICD-10-CM | POA: Diagnosis not present

## 2021-02-01 DIAGNOSIS — Z881 Allergy status to other antibiotic agents status: Secondary | ICD-10-CM

## 2021-02-01 DIAGNOSIS — Z823 Family history of stroke: Secondary | ICD-10-CM

## 2021-02-01 DIAGNOSIS — E785 Hyperlipidemia, unspecified: Secondary | ICD-10-CM | POA: Diagnosis present

## 2021-02-01 LAB — CBC
HCT: 39.5 % (ref 36.0–46.0)
Hemoglobin: 13.3 g/dL (ref 12.0–15.0)
MCH: 28.6 pg (ref 26.0–34.0)
MCHC: 33.7 g/dL (ref 30.0–36.0)
MCV: 84.9 fL (ref 80.0–100.0)
Platelets: 265 10*3/uL (ref 150–400)
RBC: 4.65 MIL/uL (ref 3.87–5.11)
RDW: 13.4 % (ref 11.5–15.5)
WBC: 10.9 10*3/uL — ABNORMAL HIGH (ref 4.0–10.5)
nRBC: 0 % (ref 0.0–0.2)

## 2021-02-01 LAB — URINALYSIS, ROUTINE W REFLEX MICROSCOPIC
Bilirubin Urine: NEGATIVE
Glucose, UA: >=500 mg/dL — AB
Hgb urine dipstick: NEGATIVE
Ketones, ur: 5 mg/dL — AB
Leukocytes,Ua: NEGATIVE
Nitrite: NEGATIVE
Protein, ur: >=300 mg/dL — AB
Specific Gravity, Urine: 1.025 (ref 1.005–1.030)
pH: 6 (ref 5.0–8.0)

## 2021-02-01 LAB — PROTIME-INR
INR: 1 (ref 0.8–1.2)
Prothrombin Time: 13.2 seconds (ref 11.4–15.2)

## 2021-02-01 LAB — HEPATIC FUNCTION PANEL
ALT: 9 U/L (ref 0–44)
AST: 9 U/L — ABNORMAL LOW (ref 15–41)
Albumin: 2.2 g/dL — ABNORMAL LOW (ref 3.5–5.0)
Alkaline Phosphatase: 81 U/L (ref 38–126)
Bilirubin, Direct: 0.1 mg/dL (ref 0.0–0.2)
Indirect Bilirubin: 0.6 mg/dL (ref 0.3–0.9)
Total Bilirubin: 0.7 mg/dL (ref 0.3–1.2)
Total Protein: 7.5 g/dL (ref 6.5–8.1)

## 2021-02-01 LAB — BASIC METABOLIC PANEL
Anion gap: 10 (ref 5–15)
BUN: 12 mg/dL (ref 6–20)
CO2: 21 mmol/L — ABNORMAL LOW (ref 22–32)
Calcium: 8.7 mg/dL — ABNORMAL LOW (ref 8.9–10.3)
Chloride: 95 mmol/L — ABNORMAL LOW (ref 98–111)
Creatinine, Ser: 1.12 mg/dL — ABNORMAL HIGH (ref 0.44–1.00)
GFR, Estimated: >60 mL/min (ref >60)
Glucose, Bld: 517 mg/dL (ref 70–99)
Potassium: 3.5 mmol/L (ref 3.5–5.1)
Sodium: 126 mmol/L — ABNORMAL LOW (ref 135–145)

## 2021-02-01 LAB — APTT: aPTT: 39 seconds — ABNORMAL HIGH (ref 24–36)

## 2021-02-01 LAB — TROPONIN I (HIGH SENSITIVITY)
Troponin I (High Sensitivity): 11 ng/L
Troponin I (High Sensitivity): 14 ng/L

## 2021-02-01 LAB — GLUCOSE, CAPILLARY
Glucose-Capillary: 507 mg/dL (ref 70–99)
Glucose-Capillary: 515 mg/dL (ref 70–99)
Glucose-Capillary: 526 mg/dL (ref 70–99)

## 2021-02-01 LAB — LACTIC ACID, PLASMA
Lactic Acid, Venous: 1.2 mmol/L (ref 0.5–1.9)
Lactic Acid, Venous: 1.3 mmol/L (ref 0.5–1.9)

## 2021-02-01 LAB — MAGNESIUM: Magnesium: 1.9 mg/dL (ref 1.7–2.4)

## 2021-02-01 LAB — CBG MONITORING, ED
Glucose-Capillary: 519 mg/dL (ref 70–99)
Glucose-Capillary: 486 mg/dL — ABNORMAL HIGH (ref 70–99)

## 2021-02-01 LAB — RESP PANEL BY RT-PCR (FLU A&B, COVID) ARPGX2
Influenza A by PCR: NEGATIVE
Influenza B by PCR: NEGATIVE
SARS Coronavirus 2 by RT PCR: NEGATIVE

## 2021-02-01 LAB — HEPARIN LEVEL (UNFRACTIONATED): Heparin Unfractionated: <0.1 IU/mL — ABNORMAL LOW (ref 0.30–0.70)

## 2021-02-01 LAB — BETA-HYDROXYBUTYRIC ACID: Beta-Hydroxybutyric Acid: 0.37 mmol/L — ABNORMAL HIGH (ref 0.05–0.27)

## 2021-02-01 LAB — I-STAT BETA HCG BLOOD, ED (MC, WL, AP ONLY): I-stat hCG, quantitative: 6.7 m[IU]/mL — ABNORMAL HIGH (ref <5)

## 2021-02-01 MED ORDER — DAKINS (1/4 STRENGTH) 0.125 % EX SOLN
Freq: Two times a day (BID) | CUTANEOUS | Status: AC
  Filled 2021-02-01: qty 473

## 2021-02-01 MED ORDER — SODIUM CHLORIDE 0.9 % IV BOLUS
1000.0000 mL | Freq: Once | INTRAVENOUS | Status: AC
  Administered 2021-02-01: 1000 mL via INTRAVENOUS

## 2021-02-01 MED ORDER — FLUCONAZOLE IN SODIUM CHLORIDE 400-0.9 MG/200ML-% IV SOLN
400.0000 mg | INTRAVENOUS | Status: DC
  Administered 2021-02-01 – 2021-02-02 (×2): 400 mg via INTRAVENOUS
  Filled 2021-02-01 (×2): qty 200

## 2021-02-01 MED ORDER — SODIUM CHLORIDE (PF) 0.9 % IJ SOLN
INTRAMUSCULAR | Status: AC
  Filled 2021-02-01: qty 50

## 2021-02-01 MED ORDER — HYDROXYZINE PAMOATE 50 MG PO CAPS: 50.0000 mg | ORAL_CAPSULE | Freq: Four times a day (QID) | ORAL | Status: DC | PRN

## 2021-02-01 MED ORDER — FUROSEMIDE 40 MG PO TABS: 40.0000 mg | ORAL_TABLET | Freq: Every day | ORAL | Status: DC

## 2021-02-01 MED ORDER — FENTANYL CITRATE (PF) 100 MCG/2ML IJ SOLN
50.0000 ug | Freq: Once | INTRAMUSCULAR | Status: AC
  Administered 2021-02-01: 50 ug via INTRAVENOUS
  Filled 2021-02-01: qty 2

## 2021-02-01 MED ORDER — ACETAMINOPHEN 325 MG PO TABS
650.0000 mg | ORAL_TABLET | Freq: Once | ORAL | Status: AC
  Administered 2021-02-01: 650 mg via ORAL
  Filled 2021-02-01: qty 2

## 2021-02-01 MED ORDER — VANCOMYCIN HCL 1.25 G IV SOLR
1250.0000 mg | Freq: Two times a day (BID) | INTRAVENOUS | Status: DC
  Filled 2021-02-01 (×2): qty 1250

## 2021-02-01 MED ORDER — HEPARIN (PORCINE) 25000 UT/250ML-% IV SOLN
2800.0000 [IU]/h | INTRAVENOUS | Status: DC
  Administered 2021-02-01: 2150 [IU]/h via INTRAVENOUS
  Administered 2021-02-02: 2500 [IU]/h via INTRAVENOUS
  Administered 2021-02-02: 2800 [IU]/h via INTRAVENOUS
  Filled 2021-02-01 (×3): qty 250

## 2021-02-01 MED ORDER — PROCHLORPERAZINE EDISYLATE 10 MG/2ML IJ SOLN
10.0000 mg | Freq: Once | INTRAMUSCULAR | Status: AC
  Administered 2021-02-01: 10 mg via INTRAVENOUS
  Filled 2021-02-01: qty 2

## 2021-02-01 MED ORDER — PANTOPRAZOLE SODIUM 40 MG PO TBEC
40.0000 mg | DELAYED_RELEASE_TABLET | Freq: Every day | ORAL | Status: DC
  Administered 2021-02-02 – 2021-02-09 (×8): 40 mg via ORAL
  Filled 2021-02-01 (×8): qty 1

## 2021-02-01 MED ORDER — INSULIN ASPART 100 UNIT/ML IJ SOLN
0.0000 [IU] | INTRAMUSCULAR | Status: DC
  Administered 2021-02-01: 20 [IU] via SUBCUTANEOUS
  Administered 2021-02-02: 3 [IU] via SUBCUTANEOUS
  Administered 2021-02-02: 7 [IU] via SUBCUTANEOUS
  Administered 2021-02-02: 20 [IU] via SUBCUTANEOUS
  Administered 2021-02-02: 11 [IU] via SUBCUTANEOUS
  Administered 2021-02-02 – 2021-02-03 (×2): 4 [IU] via SUBCUTANEOUS
  Administered 2021-02-03 (×2): 7 [IU] via SUBCUTANEOUS
  Administered 2021-02-03: 4 [IU] via SUBCUTANEOUS
  Administered 2021-02-03: 11 [IU] via SUBCUTANEOUS
  Administered 2021-02-03: 7 [IU] via SUBCUTANEOUS
  Administered 2021-02-04: 4 [IU] via SUBCUTANEOUS
  Administered 2021-02-04: 3 [IU] via SUBCUTANEOUS
  Administered 2021-02-04: 7 [IU] via SUBCUTANEOUS
  Administered 2021-02-04 (×3): 4 [IU] via SUBCUTANEOUS
  Administered 2021-02-05: 7 [IU] via SUBCUTANEOUS
  Administered 2021-02-05 (×2): 4 [IU] via SUBCUTANEOUS
  Administered 2021-02-05 – 2021-02-06 (×3): 3 [IU] via SUBCUTANEOUS
  Administered 2021-02-06: 4 [IU] via SUBCUTANEOUS
  Administered 2021-02-06 – 2021-02-07 (×6): 3 [IU] via SUBCUTANEOUS
  Filled 2021-02-01: qty 0.2

## 2021-02-01 MED ORDER — BUPROPION HCL ER (SR) 100 MG PO TB12: 100.0000 mg | ORAL_TABLET | Freq: Every morning | ORAL | Status: DC

## 2021-02-01 MED ORDER — INSULIN GLARGINE 100 UNIT/ML ~~LOC~~ SOLN
45.0000 [IU] | Freq: Every day | SUBCUTANEOUS | Status: DC
  Administered 2021-02-01 – 2021-02-02 (×2): 45 [IU] via SUBCUTANEOUS
  Filled 2021-02-01 (×2): qty 0.45

## 2021-02-01 MED ORDER — BUPROPION HCL ER (XL) 150 MG PO TB24
300.0000 mg | ORAL_TABLET | Freq: Every day | ORAL | Status: DC
  Administered 2021-02-03 – 2021-02-09 (×7): 300 mg via ORAL
  Filled 2021-02-01 (×7): qty 2

## 2021-02-01 MED ORDER — ATORVASTATIN CALCIUM 40 MG PO TABS: 40.0000 mg | ORAL_TABLET | Freq: Every day | ORAL | Status: DC

## 2021-02-01 MED ORDER — SODIUM CHLORIDE 0.9 % IV SOLN: 2.0000 g | Freq: Once | INTRAVENOUS | Status: DC

## 2021-02-01 MED ORDER — OXYCODONE HCL 5 MG PO TABS
5.0000 mg | ORAL_TABLET | Freq: Four times a day (QID) | ORAL | Status: AC | PRN
Start: 2021-02-01 — End: 2021-02-02
  Administered 2021-02-01 – 2021-02-02 (×2): 5 mg via ORAL
  Filled 2021-02-01 (×2): qty 1

## 2021-02-01 MED ORDER — SODIUM CHLORIDE 0.9 % IV SOLN
200.0000 mg | INTRAVENOUS | Status: DC
  Filled 2021-02-01: qty 200

## 2021-02-01 MED ORDER — TRAZODONE HCL 50 MG PO TABS
25.0000 mg | ORAL_TABLET | Freq: Every day | ORAL | Status: DC
  Administered 2021-02-01 – 2021-02-08 (×8): 25 mg via ORAL
  Filled 2021-02-01 (×8): qty 1

## 2021-02-01 MED ORDER — IOHEXOL 350 MG/ML SOLN
100.0000 mL | Freq: Once | INTRAVENOUS | Status: AC | PRN
  Administered 2021-02-01: 100 mL via INTRAVENOUS

## 2021-02-01 MED ORDER — ONDANSETRON HCL 4 MG PO TABS: 4.0000 mg | ORAL_TABLET | Freq: Four times a day (QID) | ORAL | Status: DC | PRN

## 2021-02-01 MED ORDER — SODIUM CHLORIDE 0.9 % IV SOLN: INTRAVENOUS | Status: DC

## 2021-02-01 MED ORDER — PROMETHAZINE HCL 25 MG PO TABS
12.5000 mg | ORAL_TABLET | Freq: Four times a day (QID) | ORAL | Status: DC | PRN
  Administered 2021-02-01 – 2021-02-04 (×5): 12.5 mg via ORAL
  Filled 2021-02-01 (×6): qty 1

## 2021-02-01 MED ORDER — SODIUM CHLORIDE 0.9 % IV SOLN
2.0000 g | Freq: Once | INTRAVENOUS | Status: AC
  Administered 2021-02-01: 2 g via INTRAVENOUS
  Filled 2021-02-01: qty 2

## 2021-02-01 MED ORDER — HYDROXYZINE HCL 25 MG PO TABS
50.0000 mg | ORAL_TABLET | Freq: Four times a day (QID) | ORAL | Status: DC | PRN
  Filled 2021-02-01: qty 2

## 2021-02-01 MED ORDER — LISINOPRIL 2.5 MG PO TABS
2.5000 mg | ORAL_TABLET | Freq: Every day | ORAL | Status: DC
  Administered 2021-02-02 – 2021-02-09 (×8): 2.5 mg via ORAL
  Filled 2021-02-01 (×9): qty 1

## 2021-02-01 MED ORDER — HEPARIN BOLUS VIA INFUSION
4000.0000 [IU] | Freq: Once | INTRAVENOUS | Status: AC
  Administered 2021-02-01: 4000 [IU] via INTRAVENOUS
  Filled 2021-02-01: qty 4000

## 2021-02-01 MED ORDER — VANCOMYCIN HCL 10 G IV SOLR
2500.0000 mg | Freq: Once | INTRAVENOUS | Status: AC
  Administered 2021-02-01: 2500 mg via INTRAVENOUS
  Filled 2021-02-01 (×2): qty 2500

## 2021-02-01 MED ORDER — VENLAFAXINE HCL ER 75 MG PO CP24
75.0000 mg | ORAL_CAPSULE | Freq: Every day | ORAL | Status: DC
  Administered 2021-02-02 – 2021-02-09 (×8): 75 mg via ORAL
  Filled 2021-02-01 (×7): qty 1

## 2021-02-01 MED ORDER — ACETAMINOPHEN 325 MG PO TABS
650.0000 mg | ORAL_TABLET | Freq: Four times a day (QID) | ORAL | Status: DC
  Administered 2021-02-01 – 2021-02-09 (×32): 650 mg via ORAL
  Filled 2021-02-01 (×32): qty 2

## 2021-02-01 MED ORDER — SODIUM CHLORIDE 0.9 % IV SOLN
2.0000 g | Freq: Three times a day (TID) | INTRAVENOUS | Status: DC
  Administered 2021-02-01: 2 g via INTRAVENOUS
  Filled 2021-02-01 (×3): qty 2

## 2021-02-01 MED ORDER — ONDANSETRON HCL 4 MG/2ML IJ SOLN: 4.0000 mg | Freq: Four times a day (QID) | INTRAMUSCULAR | Status: DC | PRN

## 2021-02-01 MED ORDER — VANCOMYCIN HCL IN DEXTROSE 1-5 GM/200ML-% IV SOLN: 1000.0000 mg | Freq: Once | INTRAVENOUS | Status: DC

## 2021-02-01 NOTE — Consult Note (Addendum)
[facility]  Total days of antibiotics 1               Reason for Consult: fever, nausea, possible DKA   Referring Physician: pahwani  Active Problems:   DM (diabetes mellitus), type 2, uncontrolled with complications (HCC)   Pulmonary emboli (HCC)   Anxiety and depression   HTN (hypertension)   Infective endocarditis   Sepsis (HCC)    HPI: Karen Bennett is a 45 y.o. 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 She was noted to have elevated BS of 600s at SNF and recommended to come to ED for evaluation of DKA since she was also vomiting. She states that she has been doing poorly over the last 3 weeks since she reports not tolerating the iv abtx. Having nausea, some intermittent vomiting. She does recall that she got 4 doses of heparin  by home health on Thursday, but then not felt well since then with rigors, and chills. She has been coughing since this weekend.  Unclear if this was communicated to providers prior to this visit. She also has had poor appetite, so surprised that she has high blood sugar.  In the ED, she was found to have fever of 100.5, WBC of 10.9, repeat BS of 517. One area of erythema to left leg on exam and picc line does not appear to be associated with cellulitis. She was started on vanco/cefepime/ and continued on anidulafungin (through 7/25). Imaging showed evidence of LUL PE.   Past Medical History:  Diagnosis Date   Cellulitis    Diabetes mellitus    PE (pulmonary embolism) ~2007-2008   Not on anticoagulation    Allergies:  Allergies  Allergen Reactions   Clindamycin Nausea And Vomiting   Zofran Itching and Nausea And Vomiting   Doxycycline Nausea And Vomiting   Morphine And Related Itching   MEDICATIONS:  acetaminophen  650 mg Oral Q6H   atorvastatin  40 mg Oral q1800   [START ON 02/02/2021] buPROPion   100 mg Oral q morning   buPROPion  300 mg Oral Daily   furosemide  40 mg Oral Daily   heparin  4,000 Units Intravenous Once   insulin aspart  0-20 Units Subcutaneous Q4H   insulin glargine  45 Units Subcutaneous Daily   lisinopril  2.5 mg Oral Daily   pantoprazole  40 mg Oral Daily   sodium chloride (PF)       sodium hypochlorite   Irrigation BID   traZODone  25 mg Oral QHS   venlafaxine XR  75 mg Oral Daily    Social History   Tobacco Use   Smoking status: Never   Smokeless tobacco: Never  Substance Use Topics   Alcohol use: No   Drug use: No    Family History  Problem Relation Age of Onset   Diabetes Mother    Coronary artery disease Mother    Stroke Mother     Review of Systems -   Constitutional: positive for fever, chills, diaphoresis, activity change, appetite change, fatigue and unexpected weight change.  HENT: Negative for congestion, sore throat, rhinorrhea, sneezing, trouble swallowing and sinus pressure.  Eyes: Negative for photophobia and visual disturbance.  Respiratory: positive for cough, chest tightness, shortness of breath, wheezing and stridor.  Cardiovascular: Negative for chest pain, palpitations and leg swelling.  Gastrointestinal: positive for  nausea, and also vomiting, abdominal pain, diarrhea, constipation, blood in stool, abdominal distention and anal bleeding.  Genitourinary: Negative for dysuria, hematuria, flank pain and difficulty urinating.  Musculoskeletal: Negative for myalgias, back pain, joint swelling, arthralgias and gait problem.  Skin: Negative for color change, pallor, rash and wound.  Neurological: Negative for dizziness, tremors, weakness and light-headedness.  Hematological: Negative for adenopathy. Does not bruise/bleed easily.  Psychiatric/Behavioral: Negative for behavioral problems, confusion, sleep disturbance, dysphoric mood, decreased concentration and agitation.    OBJECTIVE: Temp:  [100.5 F (38.1 C)] 100.5 F (38.1  C) (07/12 1223) Pulse Rate:  [104-116] 104 (07/12 1549) Resp:  [18-33] 22 (07/12 1549) BP: (138-195)/(73-108) 138/73 (07/12 1549) SpO2:  [92 %-97 %] 92 % (07/12 1549) Weight:  [182.3 kg] 182.3 kg (07/12 1409) Physical Exam  Constitutional:  oriented to person, place, and time. appears well-developed and well-nourished. No distress.  HENT: [place]/AT, PERRLA, no scleral icterus Mouth/Throat: Oropharynx is clear and moist. No oropharyngeal exudate.  Cardiovascular: Normal rate, regular rhythm and normal heart sounds. Exam reveals no gallop and no friction rub.  No murmur heard.  Pulmonary/Chest: Effort normal and breath sounds normal. No respiratory distress.  has no wheezes.  Neck = supple, no nuchal rigidity Abdominal: Soft. Bowel sounds are normal.  exhibits no distension. There is no tenderness.  Ext: left leg isolated patch of erythema. Granulation in wound bed. Right arm picc line is c/d/i Lymphadenopathy: no cervical adenopathy. No axillary adenopathy Neurological: alert and oriented to person, place, and time.  Skin: Skin is warm and dry. No rash noted. No erythema.  Psychiatric: a normal mood and affect.  behavior is normal.    LABS: Results for orders placed or performed during the hospital encounter of 02/01/21 (from the past 48 hour(s))  CBG monitoring, ED     Status: Abnormal   Collection Time: 02/01/21 12:25 PM  Result Value Ref Range   Glucose-Capillary 519 (HH) 70 - 99 mg/dL    Comment: Glucose reference range applies only to samples taken after fasting for at least 8 hours.   Comment 1 Notify RN   Basic metabolic panel     Status: Abnormal   Collection Time: 02/01/21 12:33 PM  Result Value Ref Range   Sodium 126 (L) 135 - 145 mmol/L   Potassium 3.5 3.5 - 5.1 mmol/L   Chloride 95 (L) 98 - 111 mmol/L   CO2 21 (L) 22 - 32 mmol/L   Glucose, Bld 517 (HH) 70 - 99 mg/dL    Comment: Glucose reference range applies only to samples taken after fasting for at least 8  hours. CRITICAL RESULT CALLED TO, READ BACK BY AND VERIFIED WITH: HILL, L. RN @1338  02/01/21 KELLY, J.    BUN 12 6 - 20 mg/dL   Creatinine, Ser 04/04/21 (H) 0.44 - 1.00 mg/dL   Calcium 8.7 (L) 8.9 - 10.3 mg/dL   GFR, Estimated 4.01 >02 mL/min    Comment: (NOTE) Calculated using the CKD-EPI Creatinine Equation (2021)    Anion gap 10 5 - 15    Comment: Performed at [facility], [address], [place], [place]  CBC     Status: Abnormal   Collection Time: 02/01/21 12:33 PM  Result Value Ref Range   WBC 10.9 (H) 4.0 - 10.5 K/uL   RBC 4.65 3.87 - 5.11 MIL/uL   Hemoglobin 13.3 12.0 - 15.0 g/dL   HCT 04/04/21 40.3 - 47.4 %   MCV 84.9 80.0 - 100.0 fL  MCH 28.6 26.0 - 34.0 pg   MCHC 33.7 30.0 - 36.0 g/dL   RDW 64.4 03.4 - 74.2 %   Platelets 265 150 - 400 K/uL   nRBC 0.0 0.0 - 0.2 %    Comment: Performed at [facility], [address] [place], [place] [address]  Urinalysis, Routine w reflex microscopic     Status: Abnormal   Collection Time: 02/01/21 12:33 PM  Result Value Ref Range   Color, Urine YELLOW YELLOW   APPearance CLEAR CLEAR   Specific Gravity, Urine 1.025 1.005 - 1.030   pH 6.0 5.0 - 8.0   Glucose, UA >=500 (A) NEGATIVE mg/dL   Hgb urine dipstick NEGATIVE NEGATIVE   Bilirubin Urine NEGATIVE NEGATIVE   Ketones, ur 5 (A) NEGATIVE mg/dL   Protein, ur >=875 (A) NEGATIVE mg/dL   Nitrite NEGATIVE NEGATIVE   Leukocytes,Ua NEGATIVE NEGATIVE   RBC / HPF 0-5 0 - 5 RBC/hpf   WBC, UA 11-20 0 - 5 WBC/hpf   Bacteria, UA FEW (A) NONE SEEN   Squamous Epithelial / LPF 0-5 0 - 5   Mucus PRESENT     Comment: Performed at [facility], [address] [place], [place] [address]  Protime-INR     Status: None   Collection Time: 02/01/21 12:33 PM  Result Value Ref Range   Prothrombin Time 13.2 11.4 - 15.2 seconds   INR 1.0 0.8 - 1.2    Comment: (NOTE) INR goal varies based on device and disease states. Performed at [facility], [address] [place], [place] [address]   APTT     Status: Abnormal   Collection Time: 02/01/21 12:33 PM  Result Value Ref Range   aPTT 39 (H) 24 - 36 seconds    Comment:        IF BASELINE aPTT IS ELEVATED, SUGGEST PATIENT RISK ASSESSMENT BE USED TO DETERMINE APPROPRIATE ANTICOAGULANT THERAPY. Performed at [facility], [address] [place], [place] [address]   Hepatic function panel     Status: Abnormal   Collection Time: 02/01/21 12:33 PM  Result Value Ref Range   Total Protein 7.5 6.5 - 8.1 g/dL   Albumin 2.2 (L) 3.5 - 5.0 g/dL   AST 9 (L) 15 - 41 U/L   ALT 9 0 - 44 U/L   Alkaline Phosphatase 81 38 - 126 U/L   Total Bilirubin 0.7 0.3 - 1.2 mg/dL   Bilirubin, Direct 0.1 0.0 - 0.2 mg/dL   Indirect Bilirubin 0.6 0.3 - 0.9 mg/dL    Comment: Performed at [facility], [address], [place], [place] [address]  Beta-hydroxybutyric acid     Status: Abnormal   Collection Time: 02/01/21 12:33 PM  Result Value Ref Range   Beta-Hydroxybutyric Acid 0.37 (H) 0.05 - 0.27 mmol/L    Comment: Performed at [facility], [address] [place], [place] [address]  Heparin level (unfractionated)     Status: Abnormal   Collection Time: 02/01/21 12:33 PM  Result Value Ref Range   Heparin Unfractionated <0.10 (L) 0.30 - 0.70 IU/mL    Comment: (NOTE) The clinical reportable range upper limit is being lowered to >1.10 to align with the FDA approved guidance for the current laboratory assay.  If heparin results are below expected values, and patient dosage has  been confirmed, suggest follow up testing of antithrombin III levels. Performed at [facility], [address] [place], [place]  [id number]   I-Stat beta hCG blood, ED     Status: Abnormal   Collection Time: 02/01/21 12:37 PM  Result Value Ref Range   I-stat hCG, quantitative 6.7 (H) <5 mIU/mL   Comment 3            Comment:    GEST. AGE      CONC.  (mIU/mL)   <=1 WEEK        5 - 50     2 WEEKS       50 - 500     3 WEEKS       100 - 10,000     4 WEEKS     1,000 - 30,000        FEMALE AND NON-PREGNANT FEMALE:     LESS THAN 5 mIU/mL   Troponin I (High Sensitivity)     Status: None   Collection Time: 02/01/21 12:55 PM  Result Value Ref Range   Troponin I (High Sensitivity) 11 <18 ng/L    Comment: (NOTE) Elevated high sensitivity troponin I (hsTnI) values and significant  changes across serial measurements may suggest ACS but many other  chronic and acute conditions are known to elevate hsTnI results.  Refer to the "Links" section for chest pain algorithms and additional  guidance. Performed at [facility], [address] [place], [place] [id number]   Lactic acid, plasma     Status: None   Collection Time: 02/01/21  1:01 PM  Result Value Ref Range   Lactic Acid, Venous 1.2 0.5 - 1.9 mmol/L    Comment: Performed at [facility], [address] [place], [place] [id number]  Resp Panel by RT-PCR (Flu A&B, Covid) Nasopharyngeal Swab     Status: None   Collection Time: 02/01/21  1:02 PM   Specimen: Nasopharyngeal Swab; Nasopharyngeal(NP) swabs in vial transport medium  Result Value Ref Range   SARS Coronavirus 2 by RT PCR NEGATIVE NEGATIVE    Comment: (NOTE) SARS-CoV-2 target nucleic acids are NOT DETECTED.  The SARS-CoV-2 RNA is generally detectable in upper respiratory specimens during the acute phase of infection. The lowest concentration of SARS-CoV-2 viral copies this assay can detect is 138 copies/mL. A negative result does not preclude SARS-Cov-2 infection and should not be used as the sole basis for treatment or other patient management decisions. A negative result may occur with  improper specimen collection/handling, submission of specimen other than nasopharyngeal swab, presence of viral mutation(s) within the areas targeted by this assay, and inadequate  number of viral copies(<138 copies/mL). A negative result must be combined with clinical observations, patient history, and epidemiological information. The expected result is Negative.  Fact Sheet for Patients:  BloggerCourse.comhttps://www.fda.gov/media/152166/download  Fact Sheet for Healthcare Providers:  SeriousBroker.ithttps://www.fda.gov/media/152162/download  This test is no t yet approved or cleared by the [place] FDA and  has been authorized for detection and/or diagnosis of SARS-CoV-2 by FDA under an Emergency Use Authorization (EUA). This EUA will remain  in effect (meaning this test can be used) for the duration of the COVID-19 declaration under Section 564(b)(1) of the Act, 21 U.S.C.section 360bbb-3(b)(1), unless the authorization is terminated  or revoked sooner.       Influenza A by PCR NEGATIVE NEGATIVE   Influenza B by PCR NEGATIVE NEGATIVE    Comment: (NOTE) The Xpert Xpress SARS-CoV-2/FLU/RSV plus assay is intended as an aid in the diagnosis of influenza from Nasopharyngeal swab specimens and should not be used as a sole basis  for treatment. Nasal washings and aspirates are unacceptable for Xpert Xpress SARS-CoV-2/FLU/RSV testing.  Fact Sheet for Patients: BloggerCourse.com  Fact Sheet for Healthcare Providers: SeriousBroker.it  This test is not yet approved or cleared by the [place] FDA and has been authorized for detection and/or diagnosis of SARS-CoV-2 by FDA under an Emergency Use Authorization (EUA). This EUA will remain in effect (meaning this test can be used) for the duration of the COVID-19 declaration under Section 564(b)(1) of the Act, 21 U.S.C. section 360bbb-3(b)(1), unless the authorization is terminated or revoked.  Performed at [facility], [address], [place], [place] [address]     MICRO:  IMAGING: CT Angio Chest PE W and/or Wo Contrast  Result Date: 02/01/2021 CLINICAL DATA:   Weakness. History of pulmonary emboli. Infected wound of the lower extremity. EXAM: CT ANGIOGRAPHY CHEST WITH CONTRAST TECHNIQUE: Multidetector CT imaging of the chest was performed using the standard protocol during bolus administration of intravenous contrast. Multiplanar CT image reconstructions and MIPs were obtained to evaluate the vascular anatomy. CONTRAST:  OMNIPAQUE IOHEXOL 350 MG/ML SOLN COMPARISON:  Chest radiography earlier same day. Previous CT angiography 04/30/2008. FINDINGS: Cardiovascular: The heart is enlarged. No pericardial effusion. Coronary artery calcification is present. Aortic atherosclerotic calcification is present. Pulmonary arterial opacification is moderate. I think there is an embolus in the left upper lobe pulmonary arterial tree serving an area of abnormal lung described below. See coronal image 74. Mediastinum/Nodes: 15 x 22 mm ovoid lesion anterior to the trachea at the thoracic inlet, enlarged since 2009. This could be a lymph node or possibly a parathyroid adenoma. Few slightly enlarged nodes in the aortopulmonary window. Lungs/Pleura: No pleural effusion. Sub solid consolidation in the anterior aspect of the left upper lobe, the region of lung served by what appears to be an abnormal pulmonary artery probably affected by an embolus. Therefore, this probably represents an area of pulmonary infarction. Lipoid pneumonia was considered, but this would be a very atypical location for that process. Scattered patchy densities elsewhere within the lungs that could be atelectasis or mild patchy infiltrates. Upper Abdomen: Negative Musculoskeletal: No acute bone finding. Ordinary spinal degenerative changes. Review of the MIP images confirms the above findings. IMPRESSION: Pulmonary embolus demonstrated in the left upper lobe pulmonary arterial tree. This serves the anterior aspect of the left upper lobe, which is affected by patchy density probably representing pulmonary  infarction. Enlarging ovoid density anterior to the trachea at the thoracic inlet could be a lymph node or parathyroid adenoma. Aortic Atherosclerosis (ICD10-I70.0). Coronary artery calcification. Electronically Signed   By: Paulina Fusi M.D.   On: 02/01/2021 15:39   CT FEMUR LEFT W CONTRAST  Result Date: 02/01/2021 CLINICAL DATA:  Infected wound at the distal thigh laterally EXAM: CT OF THE LOWER RIGHT EXTREMITY WITH CONTRAST TECHNIQUE: Multidetector CT imaging of the lower right extremity was performed according to the standard protocol following intravenous contrast administration. CONTRAST:  OMNIPAQUE IOHEXOL 350 MG/ML SOLN COMPARISON:  CT 12/26/2020 FINDINGS: Bones/Joint/Cartilage No acute fracture or dislocation. 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 Small-moderate left knee joint effusion, nonspecific. Mild degenerative changes of the left hip joint. No bony erosion or periostitis. Ligaments Suboptimally assessed by CT. Muscles and Tendons Fatty atrophy of the left thigh and visualized proximal calf musculature. No acute musculotendinous abnormality is identified. No intramuscular fluid collection. Soft tissues Prominent skin thickening with marked subcutaneous edema most pronounced at the medial aspect of the distal thigh. Similar findings are noted  to the contralateral right side, partially included within the field of view. This is similar in appearance to the previous study. Portions of the lateral left thigh were not able to be entirely included within the field of view secondary to patient habitus. Previously seen ill-defined area of fluid and gas within the anterior aspect of the distal thigh has been debrided. There is a somewhat tubular shaped air cavity within the anterolateral soft tissues of the distal thigh near the site of prior surgical debridement likely reflecting prior surgical tract (series 3, images 251-263). No organized or drainable fluid collections are  identified. No additional sites of air or gas within the soft tissues. No left inguinal lymphadenopathy. Extensive vascular calcifications. IMPRESSION: 1. Previously seen ill-defined area of fluid and gas within the anterior aspect of the distal thigh has been surgically debrided. There is a somewhat tubular shaped air cavity within the anterolateral superficial soft tissues of the distal thigh at the surgical debridement site likely reflecting prior surgical incision site. No associated fluid collection to suggest abscess at this location. No additional sites of air or gas within the soft tissues. 2. Prominent skin thickening with marked subcutaneous edema most pronounced at the medial aspect of the distal thigh. Similar findings are noted to the contralateral right side, partially included within the field of view. Findings may reflect chronic lymphedema. 3. No acute osseous findings. 4. 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 Small-moderate left knee joint effusion, nonspecific. Electronically Signed   By: Duanne Guess D.O.   On: 02/01/2021 15:28   DG Chest Port 1 View  Result Date: 02/01/2021 CLINICAL DATA:  Hyperglycemia. EXAM: PORTABLE CHEST 1 VIEW COMPARISON:  09/06/2012 FINDINGS: Right-sided PICC line tip is in the mid SVC at the level of the carina. The cardiac silhouette, mediastinal and hilar contours are within normal limits given the AP projection and portable technique. Patchy left upper lobe airspace opacity suspicious for pneumonia. No pulmonary edema or pleural effusions. No pulmonary lesions. The bony thorax is intact. IMPRESSION: 1. Right-sided PICC line tip in the mid SVC. 2. Left upper lobe infiltrate. Electronically Signed   By: Rudie Meyer M.D.   On: 02/01/2021 13:41     Assessment/Plan:  44yo F with obesity with candidal native endocarditis admitted for Hyperglycemia/DKA but now concern for secondary infection causing her presentation - possible sources  includes picc line -bacteremia vs cellulitis  - continue on vanco, cefepime, plus anidulafungin for the time being. Await for culture results to return  - candidal endocarditis = was slated to be anidulafungin through 7/25. Will change her to fluconazole. Since I am concerned some sort of side effects from anidulafungin  -hyperglycemia = thought to be related to infection,   PE= has cough, fever, plus +infiltrate on cxr/CT-a showing pulmonary emboli - started on heparin but also on  vancomycin and cefepime- in case of septic emboli. Maybe worth changing her picc line.

## 2021-02-01 NOTE — Progress Notes (Signed)
A consult was received from an ED physician for vancomycin and cefepime per pharmacy dosing.  The patient's profile has been reviewed for ht/wt/allergies/indication/available labs.    A one time order has been placed for vancomycin 2500mg  and cefepime 2gm IV x1.  Further antibiotics/pharmacy consults should be ordered by admitting physician if indicated.                       Thank you, 02/01/2021  1:49 PM

## 2021-02-01 NOTE — Consult Note (Signed)
Consult Note  Karen Bennett 1975-09-26  [id number].    Requesting MD: Dr. Virgina Norfolk Chief Complaint/Reason for Consult: Left thigh wound   HPI:  Patient is a 45 year old female s/p I&D of left thigh abscess 12/27/20 by Dr. Fredricka Bonine. She was recently discharged 01/03/21. She is currently on PO and IV antifungals for candidemia diagnosed during prior hospitalization. PICC in place. She followed up with PCP who noted blood glucose to be elevated and told her to come in and get evaluated. Reports blood glucose has remained elevated at home although she does not understand how because she is not eating much. She has had nausea, vomiting for the last several weeks. She reports cough and body aches over the last 2 days. Not vaccinated against COVID. She reports her [facility] RN has told her the wound is looking good and does not appear to be infected. She changes the dressing on the days [facility] RN does not come. She reports small area with some redness over the last few days that is mildly tender.  ROS: Review of Systems  Constitutional:  Positive for malaise/fatigue. Negative for chills and fever.  Respiratory:  Positive for cough. Negative for shortness of breath.   Cardiovascular:  Negative for chest pain and palpitations.  Gastrointestinal:  Positive for nausea and vomiting. Negative for abdominal pain.  Genitourinary:  Negative for dysuria and urgency.  Musculoskeletal:  Positive for myalgias.  Skin:        L thigh wound, no odor or increased drainage, small area of erythema medial to wound  All other systems reviewed and are negative.  Family History  Problem Relation Age of Onset   Diabetes Mother    Coronary artery disease Mother    Stroke Mother     Past Medical History:  Diagnosis Date   Cellulitis    Diabetes mellitus    PE (pulmonary embolism) ~2007-2008   Not on anticoagulation    Past Surgical History:  Procedure Laterality Date   Abscess removal from L groin      APPLICATION OF WOUND VAC Left 05/01/2013   Procedure: APPLICATION OF WOUND VAC;  Surgeon: Kerrin Champagne, MD;  Location: [facility];  Service: Orthopedics;  Laterality: Left;   I & D EXTREMITY  11/12/2011   Procedure: IRRIGATION AND DEBRIDEMENT EXTREMITY;  Surgeon: Kathryne Hitch, MD;  Location: [facility];  Service: Orthopedics;  Laterality: Left;  foot left   I & D EXTREMITY Left 09/06/2012   Procedure: IRRIGATION AND DEBRIDEMENT EXTREMITY;  Surgeon: Eldred Manges, MD;  Location: [facility];  Service: Orthopedics;  Laterality: Left;   I & D EXTREMITY Left 05/01/2013   Procedure: INCISION AND DRAINAGE LEFT FOREFOOT ABCESS ;  Surgeon: Kerrin Champagne, MD;  Location: [facility];  Service: Orthopedics;  Laterality: Left;   INCISION AND DRAINAGE ABSCESS Left 12/27/2020   Procedure: INCISION AND DRAINAGE LEFT THIGH ABSCESS;  Surgeon: Berna Bue, MD;  Location: [facility];  Service: General;  Laterality: Left;   IR PTA VENOUS EXCEPT DIALYSIS CIRCUIT  01/04/2021   IR VENO/EXT/UNI RIGHT  01/04/2021   Surgery to remove hematoma in L leg     TEE WITHOUT CARDIOVERSION N/A 01/03/2021   Procedure: TRANSESOPHAGEAL ECHOCARDIOGRAM (TEE);  Surgeon: Quintella Reichert, MD;  Location: [facility];  Service: Cardiovascular;  Laterality: N/A;   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    Social History:  reports that she has never smoked.  She has never used smokeless tobacco. She reports that she does not drink alcohol and does not use drugs.  Allergies:  Allergies  Allergen Reactions   Clindamycin Nausea And Vomiting and Nausea Only   Zofran Itching and Nausea And Vomiting   Doxycycline Nausea And Vomiting   Morphine And Related Itching    (Not in a hospital admission)   Blood pressure (!) 157/108, pulse (!) 116, temperature (!) 100.5 F (38.1 C), temperature source Oral, resp. rate 18, SpO2 97 %. Physical Exam:  General: pleasant, WD, morbidly obese female who is laying in bed in NAD HEENT: head is normocephalic,  atraumatic.  Sclera are noninjected.  PERRL.  Ears and nose without any masses or lesions.  Mouth is pink and moist Heart: regular, rate, and rhythm.  Normal s1,s2. No obvious murmurs, gallops, or rubs noted.  Palpable radial and pedal pulses bilaterally Lungs: CTAB, no wheezes, rhonchi, or rales noted.  Respiratory effort nonlabored Abd: soft, NT, ND MS: LLE wound with mostly beefy red granulation tissue, small tract medially that was about 4 cm and purulent drainage evacuated, small area of erythema medial to wound, blue-green appearing drainage present on outer dressings Skin: warm and dry with no masses, lesions, or rashes Neuro: Cranial nerves 2-12 grossly intact, sensation is normal throughout Psych: A&Ox3 with an appropriate affect.   Results for orders placed or performed during the hospital encounter of 02/01/21 (from the past 48 hour(s))  CBG monitoring, ED     Status: Abnormal   Collection Time: 02/01/21 12:25 PM  Result Value Ref Range   Glucose-Capillary 519 (HH) 70 - 99 mg/dL    Comment: Glucose reference range applies only to samples taken after fasting for at least 8 hours.   Comment 1 Notify RN   Basic metabolic panel     Status: Abnormal   Collection Time: 02/01/21 12:33 PM  Result Value Ref Range   Sodium 126 (L) 135 - 145 mmol/L   Potassium 3.5 3.5 - 5.1 mmol/L   Chloride 95 (L) 98 - 111 mmol/L   CO2 21 (L) 22 - 32 mmol/L   Glucose, Bld 517 (HH) 70 - 99 mg/dL    Comment: Glucose reference range applies only to samples taken after fasting for at least 8 hours. CRITICAL RESULT CALLED TO, READ BACK BY AND VERIFIED WITH: HILL, L. RN @1338  02/01/21 Molly Maselli, J.    BUN 12 6 - 20 mg/dL   Creatinine, Ser 04/04/21 (H) 0.44 - 1.00 mg/dL   Calcium 8.7 (L) 8.9 - 10.3 mg/dL   GFR, Estimated 8.29 >93 mL/min    Comment: (NOTE) Calculated using the CKD-EPI Creatinine Equation (2021)    Anion gap 10 5 - 15    Comment: Performed at [facility], [name],  [place], [place]  CBC     Status: Abnormal   Collection Time: 02/01/21 12:33 PM  Result Value Ref Range   WBC 10.9 (H) 4.0 - 10.5 K/uL   RBC 4.65 3.87 - 5.11 MIL/uL   Hemoglobin 13.3 12.0 - 15.0 g/dL   HCT 04/04/21 93.8 - 10.1 %   MCV 84.9 80.0 - 100.0 fL   MCH 28.6 26.0 - 34.0 pg   MCHC 33.7 30.0 - 36.0 g/dL   RDW 75.1 02.5 - 85.2 %   Platelets 265 150 - 400 K/uL   nRBC 0.0 0.0 - 0.2 %    Comment: Performed at [facility], [address] [place], [place]  Urinalysis, Routine w reflex microscopic     Status: Abnormal   Collection Time: 02/01/21 12:33 PM  Result Value Ref Range   Color, Urine YELLOW YELLOW   APPearance CLEAR CLEAR   Specific Gravity, Urine 1.025 1.005 - 1.030   pH 6.0 5.0 - 8.0   Glucose, UA >=500 (A) NEGATIVE mg/dL   Hgb urine dipstick NEGATIVE NEGATIVE   Bilirubin Urine NEGATIVE NEGATIVE   Ketones, ur 5 (A) NEGATIVE mg/dL   Protein, ur >=751 (A) NEGATIVE mg/dL   Nitrite NEGATIVE NEGATIVE   Leukocytes,Ua NEGATIVE NEGATIVE   RBC / HPF 0-5 0 - 5 RBC/hpf   WBC, UA 11-20 0 - 5 WBC/hpf   Bacteria, UA FEW (A) NONE SEEN   Squamous Epithelial / LPF 0-5 0 - 5   Mucus PRESENT     Comment: Performed at [facility], [address], [place], [place] [address]  I-Stat beta hCG blood, ED     Status: Abnormal   Collection Time: 02/01/21 12:37 PM  Result Value Ref Range   I-stat hCG, quantitative 6.7 (H) <5 mIU/mL   Comment 3            Comment:   GEST. AGE      CONC.  (mIU/mL)   <=1 WEEK        5 - 50     2 WEEKS       50 - 500     3 WEEKS       100 - 10,000     4 WEEKS     1,000 - 30,000        FEMALE AND NON-PREGNANT FEMALE:     LESS THAN 5 mIU/mL    DG Chest Port 1 View  Result Date: 02/01/2021 CLINICAL DATA:  Hyperglycemia. EXAM: PORTABLE CHEST 1 VIEW COMPARISON:  09/06/2012 FINDINGS: Right-sided PICC line tip is in the mid SVC at the level of the carina. The cardiac silhouette, mediastinal and hilar contours  are within normal limits given the AP projection and portable technique. Patchy left upper lobe airspace opacity suspicious for pneumonia. No pulmonary edema or pleural effusions. No pulmonary lesions. The bony thorax is intact. IMPRESSION: 1. Right-sided PICC line tip in the mid SVC. 2. Left upper lobe infiltrate. Electronically Signed   By: Rudie Meyer M.D.   On: 02/01/2021 13:41      Assessment/Plan s/p I&D of left thigh abscess 12/27/20 - evacuated small pocket of purulent drainage - cultures sent  - drainage on dressing concerning for pseudomonal colonization, recommend Dakin's x72 hrs  - recommend CT to r/o undrained collection   - no other surgical recommendations at this time, we will follow   FEN: ok to have a diet from surgery standpoint VTE: ok to have chemical prophylaxis from a surgery standpoint ID: [email or web address]  - per primary attending -  Morbid obesity - BMI 64.9 Hx of PE off anticoagulation Uncontrolled T2DM  Agree with medical admission and further workup for fever. We will re-eval thigh wound later this week.  Juliet Rude, [facility] Surgery 02/01/2021, 1:48 PM Please see Amion for pager number during day hours 7:00am-4:30pm

## 2021-02-01 NOTE — Telephone Encounter (Signed)
Noted and agree with plan.

## 2021-02-01 NOTE — Progress Notes (Addendum)
Pharmacy Antibiotic Note  Karen Bennett is a 45 y.o. female admitted on 02/01/2021 with recent hx of fungal endocarditis and left thigh wound s/p debridement  6/6 . Left thigh wound has new area of redness and tenderness. Noted patient also has a PICC line with some concern for bacteremia/sepsis. Pharmacy has been consulted for vancomycin dosing. SCr is currently 1.12 and CrCl is > 100 ml/min.   Plan: Vancomycin 2500 mg x 1 received in the ED Then 1250 mg every 12 hours  Estimated AUC 501 with SCr 1.12 and Vd 0.5  Monitor renal function, levels as appropriate  Also on cefepime 2 gm IV Q 8 hours and anidulafungin 200 mg daily per Dr. Drue Second  Noted patient was to be on an echinocandin through 7/25    Weight: (!) 182.3 kg (401 lb 14.4 oz) (01/03/21)  Temp (24hrs), Avg:100.5 F (38.1 C), Min:100.5 F (38.1 C), Max:100.5 F (38.1 C)  Recent Labs  Lab 02/01/21 1233 02/01/21 1301  WBC 10.9*  --   CREATININE 1.12*  --   LATICACIDVEN  --  1.2    Estimated Creatinine Clearance: 110.9 mL/min (A) (by C-G formula based on SCr of 1.12 mg/dL (H)).    Allergies  Allergen Reactions   Clindamycin Nausea And Vomiting   Zofran Itching and Nausea And Vomiting   Doxycycline Nausea And Vomiting   Morphine And Related Itching      Thank you for allowing pharmacy to be a part of this patient's care.  Sharin Mons, PharmD, BCPS, BCIDP Infectious Diseases Clinical Pharmacist Phone: [phone] 02/01/2021 4:42 PM

## 2021-02-01 NOTE — Telephone Encounter (Signed)
Tresa Endo with [facility] called to report critical lab value for the patient. Patient's glucose was 627 for labs drawn yesterday. Patient not scheduled to be seen in the office until 02/15/21. Patient diabetes is also being managed by her pcp. I have instructed Tresa Endo to reach out to patient pcp regarding patient glucose level and I have provided her with the number for Dr. Arturo Morton office. Arhaan Chesnut T Pricilla Loveless

## 2021-02-01 NOTE — Progress Notes (Signed)
ANTICOAGULATION CONSULT NOTE - Initial Consult  Pharmacy Consult for heparin Indication: pulmonary embolus  Allergies  Allergen Reactions   Clindamycin Nausea And Vomiting   Zofran Itching and Nausea And Vomiting   Doxycycline Nausea And Vomiting   Morphine And Related Itching    Patient Measurements: Weight: (!) 182.3 kg (401 lb 14.4 oz) (01/03/21) Heparin Dosing Weight: 108.5 kg  Vital Signs: Temp: 100.5 F (38.1 C) (07/12 1223) Temp Source: Oral (07/12 1223) BP: 138/73 (07/12 1549) Pulse Rate: 104 (07/12 1549)  Labs: Recent Labs    02/01/21 1233 02/01/21 1255  HGB 13.3  --   HCT 39.5  --   PLT 265  --   APTT 39*  --   LABPROT 13.2  --   INR 1.0  --   CREATININE 1.12*  --   TROPONINIHS  --  11    Estimated Creatinine Clearance: 110.9 mL/min (A) (by C-G formula based on SCr of 1.12 mg/dL (H)).   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  Per EDP note "Patient states that she is not been taking her Xarelto for the past month due to her nausea vomiting at times".   Wt from 01/03/21 = 182.3 kg, Ht 66 inches. Baseline labs: aPTT 39 sec, INR 1.0, CBC WNL. No bleeding reported.  Goal of Therapy:  Heparin level 0.3-0.7 units/ml Monitor platelets by anticoagulation protocol: Yes   Plan:  Give 4000 units bolus x 1 Start heparin infusion at 2150 units/hr Check anti-Xa level in 6 hours and daily while on heparin Continue to monitor H&H and platelets Ordered add on heparin level for baseline   Herby Abraham, Pharm.D 02/01/2021 4:16 PM    Herby Abraham, Pharm.D 02/01/2021 4:10 PM

## 2021-02-01 NOTE — ED Triage Notes (Signed)
Pt arrived via walk in, c/o hyperglycemia. States she has labs drawn weekly, was called and told she was in DKA. States she has been vomiting and CBGs have been high at home.   [place] for EMS

## 2021-02-01 NOTE — Sepsis Progress Note (Signed)
ELINK tracking the Code Sepsis. 

## 2021-02-01 NOTE — H&P (Signed)
History and Physical    Karen Bennett [name]:[id number] DOB: 09-14-1975 DOA: 02/01/2021  PCP: Pablo Lawrence, NP  Patient coming from: Home  I have personally briefly reviewed patient's old medical records in [place]  Chief Complaint: High blood sugar  HPI: Karen Bennett is a 45 y.o. female with medical history significant of type 2 diabetes mellitus, prior history of DVT/PE, right BKA, super morbid obesity, fungemia, endocarditis who was hospitalized from 12/26/2020 through 01/05/2021 for left lateral thigh abscess and during this hospitalization she was also diagnosed with Candida albicans fungemia/mitral valve endocarditis and she was discharged home on IV micafungin for 6 weeks.  She completed 9 days of IV antibiotics for left thigh abscess which grew Pseudomonas.  She comes to the ER today for advice from the home health nurse which found out that her blood sugar was more than 500.  Although patient herself did not have any complaints.  However having said that, she tells me that she has been having intermittent nausea and vomiting at home since she has been started on antifungals at home.  Initially she claimed that she has not been able to eat or drink anything at home.  When I brought up the fact that she does not look dehydrated for a person who has not had anything to eat for a month.  She felt offended and changed her statement that she has intermittent symptoms and not all the time.  Her friend/neighbor is at the bedside who has been talking for the patient for most part and unfortunately continues to interrupt me from interviewing the patient.  For some reason, they seem to be hostile against medical community and do not seem to be happy with the care they have received so far.  Regarding her, she was not having any chills, sweating or fever at home.  ED Course: Upon arrival to the ED, her temperature was 100.5, she was tachycardic and tachypneic.  Slightly elevated blood pressure  as well.  Mild leukocytosis 10.9.  Yet once again hyponatremia with sodium of 126.  CKD stage IIIa at baseline.  Blood sugar 517 but anion gap normal.  No DKA.  Lactic acid normal.  She is not vaccinated for COVID, however COVID test is negative here.  She was already seen by theGeneral surgery in the ED and they evacuated small pocket of purulent drainage from the left lateral thigh.  Cultures were sent.  They recommended dressing changes with Dakin's x72 hours.  Recommended CT of the left thigh which ruled out abscess.  ED physician also discussed the case with Dr. Graylon Good of ID and per her recommendations, patient was started on cefepime and Rocephin.  Hospital service was then consulted to admit the patient for further management.  CT angiogram was also done which once again showed left-sided PE which is chronic.  Patient was started on heparin drip as well.  Review of Systems: As per HPI otherwise negative.    Past Medical History:  Diagnosis Date   Cellulitis    Diabetes mellitus    PE (pulmonary embolism) ~2007-2008   Not on anticoagulation    Past Surgical History:  Procedure Laterality Date   Abscess removal from L groin     APPLICATION OF WOUND VAC Left 05/01/2013   Procedure: APPLICATION OF WOUND VAC;  Surgeon: Jessy Oto, MD;  Location: [facility];  Service: Orthopedics;  Laterality: Left;   I & D EXTREMITY  11/12/2011   Procedure: IRRIGATION AND DEBRIDEMENT EXTREMITY;  Surgeon: Mcarthur Rossetti, MD;  Location: [facility];  Service: Orthopedics;  Laterality: Left;  foot left   I & D EXTREMITY Left 09/06/2012   Procedure: IRRIGATION AND DEBRIDEMENT EXTREMITY;  Surgeon: Marybelle Killings, MD;  Location: [facility];  Service: Orthopedics;  Laterality: Left;   I & D EXTREMITY Left 05/01/2013   Procedure: INCISION AND DRAINAGE LEFT FOREFOOT ABCESS ;  Surgeon: Jessy Oto, MD;  Location: [facility];  Service: Orthopedics;  Laterality: Left;   INCISION AND DRAINAGE ABSCESS Left 12/27/2020   Procedure:  INCISION AND DRAINAGE LEFT THIGH ABSCESS;  Surgeon: Clovis Riley, MD;  Location: [facility];  Service: General;  Laterality: Left;   IR PTA VENOUS EXCEPT DIALYSIS CIRCUIT  01/04/2021   IR VENO/EXT/UNI RIGHT  01/04/2021   Surgery to remove hematoma in L leg     TEE WITHOUT CARDIOVERSION N/A 01/03/2021   Procedure: TRANSESOPHAGEAL ECHOCARDIOGRAM (TEE);  Surgeon: Sueanne Margarita, MD;  Location: [facility] END[facility];  Service: Cardiovascular;  Laterality: N/A;   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     reports that she has never smoked. She has never used smokeless tobacco. She reports that she does not drink alcohol and does not use drugs.  Allergies  Allergen Reactions   Clindamycin Nausea And Vomiting   Zofran Itching and Nausea And Vomiting   Doxycycline Nausea And Vomiting   Morphine And Related Itching    Family History  Problem Relation Age of Onset   Diabetes Mother    Coronary artery disease Mother    Stroke Mother     Prior to Admission medications   Medication Sig Start Date End Date Taking? Authorizing Provider  acetaminophen (TYLENOL) 325 MG tablet Take 2 tablets (650 mg total) by mouth every 6 (six) hours. 01/04/21   Domenic Polite, MD  atorvastatin (LIPITOR) 40 MG tablet Take 1 tablet (40 mg total) by mouth daily at 6 PM. 05/12/13   Daleen Bo, Arie Sabina, MD  buPROPion [facility] SR) 100 MG 12 hr tablet Take 100 mg by mouth every morning. 11/12/20   [provider]  buPROPion (WELLBUTRIN XL) 300 MG 24 hr tablet Take 300 mg by mouth daily.    [provider]  collagenase (SANTYL) ointment Apply topically daily. To left leg 01/04/21   Domenic Polite, MD  furosemide (LASIX) 40 MG tablet Take 40 mg by mouth daily. 11/12/20   [provider]  glucose blood test strip Use as instructed 06/26/13   Advani, Vernon Prey, MD  glucose monitoring kit (FREESTYLE) monitoring kit 1 each by Does not apply route as needed for other. 06/26/13   Lorayne Marek, MD  HUMALOG KWIKPEN 100  UNIT/ML KwikPen Inject subcutaneously twice daily as per sliding scale: if 70-150= 0 units; 151-200= 2 u; 201-250= 4 u; 251-300= 6 u; 301-350= 8 u; 351-400= 10 u; 401-450= 12u; anything greater than 400 call physician. 10/27/20   [provider]  hydrOXYzine (VISTARIL) 50 MG capsule Take 50 mg by mouth every 6 (six) hours as needed for itching.    [provider]  insulin glargine (LANTUS) 100 UNIT/ML injection Inject 0.45 mLs (45 Units total) into the skin daily. 01/04/21   Domenic Polite, MD  Iron-FA-B Cmp-C-Biot-Probiotic (FUSION PLUS) CAPS Take 1 capsule by mouth daily.    [provider]  lisinopril (ZESTRIL) 2.5 MG tablet Take 2.5 mg by mouth daily.    [provider]  micafungin (MYCAMINE) 50 MG injection Inject 150 mg into the  vein daily. Indication: candida albicans endocarditis First dose: Yes Last Day of Therapy: 02/14/21 Labs - Once weekly: CBC/D and BMP Labs - Every other week: ESR and CRP Method of administration: Elastomeric Method of administration may be changed at the discretion of home infusion pharmacist based upon assessment of the patient and/or caregiver's ability to self-administer the medication ordered 01/05/21 02/14/21  Raiford Noble Latif, DO  omeprazole (PRILOSEC) 20 MG capsule Take 1 capsule by mouth daily. 11/12/20   [provider]  potassium chloride (KLOR-CON) 10 MEQ tablet Take 10 mEq by mouth daily. 11/12/20   [provider]  promethazine (PHENERGAN) 25 MG tablet Take 25 mg by mouth 2 (two) times daily as needed for nausea/vomiting. 01/12/21   [provider]  SEMGLEE, YFGN, 100 UNIT/ML SOPN Inject 60 Units into the skin daily. 10/29/20   [provider]  SitaGLIPtin-MetFORMIN HCl (JANUMET XR) [phone] MG TB24 Take 1 tablet by mouth daily.    [provider]  traZODone (DESYREL) 50 MG tablet Take 25 mg by mouth at bedtime.    [provider]  venlafaxine XR (EFFEXOR-XR) 75 MG 24 hr  capsule Take 75 mg by mouth daily. 11/12/20   [provider]  XARELTO 20 MG TABS tablet Take 20 mg by mouth daily. 11/12/20   [provider]    Physical Exam: Vitals:   02/01/21 1230 02/01/21 1400 02/01/21 1409 02/01/21 1549  BP: (!) 155/93 (!) 195/88  138/73  Pulse: (!) 116 (!) 112  (!) 104  Resp: (!) 21 (!) 33  (!) 22  Temp:      TempSrc:      SpO2: 97% 92%  92%  Weight:   (!) 182.3 kg     Constitutional: NAD, calm, comfortable Vitals:   02/01/21 1230 02/01/21 1400 02/01/21 1409 02/01/21 1549  BP: (!) 155/93 (!) 195/88  138/73  Pulse: (!) 116 (!) 112  (!) 104  Resp: (!) 21 (!) 33  (!) 22  Temp:      TempSrc:      SpO2: 97% 92%  92%  Weight:   (!) 182.3 kg    Eyes: PERRL, lids and conjunctivae normal, super morbidly obese ENMT: Mucous membranes are moist. Posterior pharynx clear of any exudate or lesions.Normal dentition.  Neck: normal, supple, no masses, no thyromegaly Respiratory: clear to auscultation bilaterally, no wheezing, no crackles. Normal respiratory effort. No accessory muscle use.  Cardiovascular: Regular rate and rhythm, no murmurs / rubs / gallops. No extremity edema. 2+ pedal pulses. No carotid bruits.  Abdomen: no tenderness, no masses palpated. No hepatosplenomegaly. Bowel sounds positive.  Musculoskeletal: no clubbing / cyanosis. No joint deformity upper and lower extremities.  Right BKA. Skin: Has dressing on the left lateral thigh.  Dressing not removed. Neurologic: CN 2-12 grossly intact. Sensation intact, DTR normal. Strength 5/5 in all 4.  Psychiatric: Normal judgment and insight. Alert and oriented x 3. Normal mood.    Labs on Admission: I have personally reviewed following labs and imaging studies  CBC: Recent Labs  Lab 02/01/21 1233  WBC 10.9*  HGB 13.3  HCT 39.5  MCV 84.9  PLT 681   Basic Metabolic Panel: Recent Labs  Lab 02/01/21 1233  NA 126*  K 3.5  CL 95*  CO2 21*  GLUCOSE 517*  BUN 12  CREATININE 1.12*   CALCIUM 8.7*   GFR: Estimated Creatinine Clearance: 110.9 mL/min (A) (by C-G formula based on SCr of 1.12 mg/dL (H)). Liver Function Tests: Recent Labs  Lab 02/01/21 1233  AST 9*  ALT 9  ALKPHOS 81  BILITOT 0.7  PROT 7.5  ALBUMIN 2.2*   No results for input(s): LIPASE, AMYLASE in the last 168 hours. No results for input(s): AMMONIA in the last 168 hours. Coagulation Profile: Recent Labs  Lab 02/01/21 1233  INR 1.0   Cardiac Enzymes: No results for input(s): CKTOTAL, CKMB, CKMBINDEX, TROPONINI in the last 168 hours. BNP (last 3 results) No results for input(s): PROBNP in the last 8760 hours. HbA1C: No results for input(s): HGBA1C in the last 72 hours. CBG: Recent Labs  Lab 02/01/21 1225  GLUCAP 519*   Lipid Profile: No results for input(s): CHOL, HDL, LDLCALC, TRIG, CHOLHDL, LDLDIRECT in the last 72 hours. Thyroid Function Tests: No results for input(s): TSH, T4TOTAL, FREET4, T3FREE, THYROIDAB in the last 72 hours. Anemia Panel: No results for input(s): VITAMINB12, FOLATE, FERRITIN, TIBC, IRON, RETICCTPCT in the last 72 hours. Urine analysis:    Component Value Date/Time   COLORURINE YELLOW 02/01/2021 1233   APPEARANCEUR CLEAR 02/01/2021 1233   LABSPEC 1.025 02/01/2021 1233   PHURINE 6.0 02/01/2021 1233   GLUCOSEU >=500 (A) 02/01/2021 1233   HGBUR NEGATIVE 02/01/2021 1233   BILIRUBINUR NEGATIVE 02/01/2021 1233   KETONESUR 5 (A) 02/01/2021 1233   PROTEINUR >=300 (A) 02/01/2021 1233   UROBILINOGEN 0.2 09/05/2011 0558   NITRITE NEGATIVE 02/01/2021 1233   LEUKOCYTESUR NEGATIVE 02/01/2021 1233    Radiological Exams on Admission: CT Angio Chest PE W and/or Wo Contrast  Result Date: 02/01/2021 CLINICAL DATA:  Weakness. History of pulmonary emboli. Infected wound of the lower extremity. EXAM: CT ANGIOGRAPHY CHEST WITH CONTRAST TECHNIQUE: Multidetector CT imaging of the chest was performed using the standard protocol during bolus administration of intravenous  contrast. Multiplanar CT image reconstructions and MIPs were obtained to evaluate the vascular anatomy. CONTRAST:  149m OMNIPAQUE IOHEXOL 350 MG/ML SOLN COMPARISON:  Chest radiography earlier same day. Previous CT angiography 04/30/2008. FINDINGS: Cardiovascular: The heart is enlarged. No pericardial effusion. Coronary artery calcification is present. Aortic atherosclerotic calcification is present. Pulmonary arterial opacification is moderate. I think there is an embolus in the left upper lobe pulmonary arterial tree serving an area of abnormal lung described below. See coronal image 74. Mediastinum/Nodes: 15 x 22 mm ovoid lesion anterior to the trachea at the thoracic inlet, enlarged since 2009. This could be a lymph node or possibly a parathyroid adenoma. Few slightly enlarged nodes in the aortopulmonary window. Lungs/Pleura: No pleural effusion. Sub solid consolidation in the anterior aspect of the left upper lobe, the region of lung served by what appears to be an abnormal pulmonary artery probably affected by an embolus. Therefore, this probably represents an area of pulmonary infarction. Lipoid pneumonia was considered, but this would be a very atypical location for that process. Scattered patchy densities elsewhere within the lungs that could be atelectasis or mild patchy infiltrates. Upper Abdomen: Negative Musculoskeletal: No acute bone finding. Ordinary spinal degenerative changes. Review of the MIP images confirms the above findings. IMPRESSION: Pulmonary embolus demonstrated in the left upper lobe pulmonary arterial tree. This serves the anterior aspect of the left upper lobe, which is affected by patchy density probably representing pulmonary infarction. Enlarging ovoid density anterior to the trachea at the thoracic inlet could be a lymph node or parathyroid adenoma. Aortic Atherosclerosis (ICD10-I70.0). Coronary artery calcification. Electronically Signed   By: MNelson ChimesM.D.   On: 02/01/2021  15:39   CT FEMUR LEFT W CONTRAST  Result Date: 02/01/2021 CLINICAL  DATA:  Infected wound at the distal thigh laterally EXAM: CT OF THE LOWER RIGHT EXTREMITY WITH CONTRAST TECHNIQUE: Multidetector CT imaging of the lower right extremity was performed according to the standard protocol following intravenous contrast administration. CONTRAST:  19m OMNIPAQUE IOHEXOL 350 MG/ML SOLN COMPARISON:  CT 12/26/2020 FINDINGS: Bones/Joint/Cartilage No acute fracture or dislocation. 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 Small-moderate left knee joint effusion, nonspecific. Mild degenerative changes of the left hip joint. No bony erosion or periostitis. Ligaments Suboptimally assessed by CT. Muscles and Tendons Fatty atrophy of the left thigh and visualized proximal calf musculature. No acute musculotendinous abnormality is identified. No intramuscular fluid collection. Soft tissues Prominent skin thickening with marked subcutaneous edema most pronounced at the medial aspect of the distal thigh. Similar findings are noted to the contralateral right side, partially included within the field of view. This is similar in appearance to the previous study. Portions of the lateral left thigh were not able to be entirely included within the field of view secondary to patient habitus. Previously seen ill-defined area of fluid and gas within the anterior aspect of the distal thigh has been debrided. There is a somewhat tubular shaped air cavity within the anterolateral soft tissues of the distal thigh near the site of prior surgical debridement likely reflecting prior surgical tract (series 3, images 251-263). No organized or drainable fluid collections are identified. No additional sites of air or gas within the soft tissues. No left inguinal lymphadenopathy. Extensive vascular calcifications. IMPRESSION: 1. Previously seen ill-defined area of fluid and gas within the anterior aspect of the distal thigh has been  surgically debrided. There is a somewhat tubular shaped air cavity within the anterolateral superficial soft tissues of the distal thigh at the surgical debridement site likely reflecting prior surgical incision site. No associated fluid collection to suggest abscess at this location. No additional sites of air or gas within the soft tissues. 2. Prominent skin thickening with marked subcutaneous edema most pronounced at the medial aspect of the distal thigh. Similar findings are noted to the contralateral right side, partially included within the field of view. Findings may reflect chronic lymphedema. 3. No acute osseous findings. 4. 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 Small-moderate left knee joint effusion, nonspecific. Electronically Signed   By: NDavina PokeD.O.   On: 02/01/2021 15:28   DG Chest Port 1 View  Result Date: 02/01/2021 CLINICAL DATA:  Hyperglycemia. EXAM: PORTABLE CHEST 1 VIEW COMPARISON:  09/06/2012 FINDINGS: Right-sided PICC line tip is in the mid SVC at the level of the carina. The cardiac silhouette, mediastinal and hilar contours are within normal limits given the AP projection and portable technique. Patchy left upper lobe airspace opacity suspicious for pneumonia. No pulmonary edema or pleural effusions. No pulmonary lesions. The bony thorax is intact. IMPRESSION: 1. Right-sided PICC line tip in the mid SVC. 2. Left upper lobe infiltrate. Electronically Signed   By: PMarijo SanesM.D.   On: 02/01/2021 13:41     Assessment/Plan Active Problems:   DM (diabetes mellitus), type 2, uncontrolled with complications ([facility]   Pulmonary emboli (HCC)   Anxiety and depression   HTN (hypertension)   Infective endocarditis   [place]epsis [place]    Sepsis/left thigh superficial/epidural abscess: Patient meets sepsis criteria based on temperature 100.5, tachycardia and tachypnea and leukocytosis.  Likely source is left superficial abscess which has been drained by the  surgery already.  Per ID recommendations, I will continue cefepime and Rocephin.  Wound culture and  blood culture have already been seen.  ID and general surgery are officially consulted and will see this patient.  Will defer to them for further management.  Chronic PE: Continue IV heparin drip for now.  Once patient is able to take p.o., will transition her back to Xarelto.  Candida albicans/fungemia/mitral valve endocarditis: We will resume micafungin.  Per ID note and discharge summary from recent hospitalization, the plan was for her to continue micafungin for 6 weeks and then oral antifungals for probably lifelong.  When discussed with the patient, patient was shocked to hear this.  She claims that no one had mentioned to her that she will need prolonged or perhaps lifelong antifungals.  I advised her to discuss with ID.  Uncontrolled type 2 diabetes mellitus with hyperglycemia: Takes 45 units of Lantus at home which I will continue along with SSI.  Will monitor closely.  Hyperlipidemia: Resume atorvastatin.  Hypertension: Slightly elevated, resume home lisinopril.  CKD stage IIIa: At baseline.  Anxiety and pression: Resume home medications with  DVT prophylaxis: heparin bolus via infusion 4,000 Units Start: 02/01/21 1615 Code Status: Full code Family Communication: Her friend present at bedside.  Plan of care discussed with both of them.  They verbalized understanding.  No further questions asked. Disposition Plan: Will likely require hospitalization for 2 to 3 days. Consults called: ID and general surgery Admission status: Inpatient   Status is: Inpatient  Remains inpatient appropriate because:Inpatient level of care appropriate due to severity of illness  Dispo: The patient is from: Home              Anticipated d/c is to: Home              Patient currently is not medically stable to d/c.   Difficult to place patient No       Darliss Cheney MD [facility]  02/01/2021, 5:04 PM  To contact the attending provider between 7A-7P or the covering provider during after hours 7P-7A, please log into the web site [email or web address]

## 2021-02-01 NOTE — ED Provider Notes (Signed)
[facility] DEPT Provider Note   CSN: [id number] Arrival date & time: 02/01/21  1211     History Chief Complaint  Patient presents with   Hyperglycemia    Karen Bennett is a 45 y.o. female.  Patient currently on IV antifungals for bacteremia from fungal infection.  Had recent prolonged hospital stay.  She is currently on a 6-week course of treatment for fungal bacteremia as well as oral fluconazole.  She also had an I&D of a left thigh abscess about a month ago.  She followed up with the primary doctor who noticed that her blood sugar was elevated and she was told to come for evaluation.  She has had some ongoing nausea and vomiting for the last several weeks as well as body aches, cough over the last 2 days.  She is not vaccinated against COVID.  Currently she has a PICC line.  The history is provided by the patient.  Hyperglycemia Blood sugar level PTA:  High Onset quality:  Gradual Timing:  Constant Progression:  Unchanged Chronicity:  Recurrent Associated symptoms: nausea and vomiting   Associated symptoms: no abdominal pain, no chest pain, no dysuria, no fever and no shortness of breath   Risk factors: obesity       Past Medical History:  Diagnosis Date   Cellulitis    Diabetes mellitus    PE (pulmonary embolism) ~2007-2008   Not on anticoagulation    Patient Active Problem List   Diagnosis Date Noted   Infective endocarditis    Pressure injury of skin 12/28/2020   BMI 60.0-69.9, adult ([place]) 12/27/2020   HTN (hypertension) 12/27/2020   History of pulmonary embolism 12/27/2020   Necrotizing fasciitis of lower leg ([place]) Thigh  12/27/2020   Cellulitis 12/26/2020   Anxiety and depression 06/26/2013   Normocytic anemia 05/22/2013   Depression 05/22/2013   Grief reaction 05/22/2013   Acute renal insufficiency 05/07/2013   Phantom limb pain ([place]) 05/06/2013   HLD (hyperlipidemia) 05/02/2013   Diabetic peripheral neuropathy  associated with type 2 diabetes mellitus ([place]) 04/30/2013    Class: Chronic   MSSA (methicillin susceptible Staphylococcus aureus) 09/06/2012   Foot osteomyelitis, left ([place]) 09/03/2012   Pulmonary emboli ([place]) 11/11/2011   Diabetic foot ulcer ([place]) 09/05/2011   Obesity, Class III, BMI 40-49.9 (morbid obesity) ([place]) 08/15/2011   DM (diabetes mellitus), type 2, uncontrolled with complications ([place]) 54/65/0354    Past Surgical History:  Procedure Laterality Date   Abscess removal from L groin     APPLICATION OF WOUND VAC Left 05/01/2013   Procedure: APPLICATION OF WOUND VAC;  Surgeon: Jessy Oto, MD;  Location: [facility];  Service: Orthopedics;  Laterality: Left;   I & D EXTREMITY  11/12/2011   Procedure: IRRIGATION AND DEBRIDEMENT EXTREMITY;  Surgeon: Mcarthur Rossetti, MD;  Location: [facility];  Service: Orthopedics;  Laterality: Left;  foot left   I & D EXTREMITY Left 09/06/2012   Procedure: IRRIGATION AND DEBRIDEMENT EXTREMITY;  Surgeon: Marybelle Killings, MD;  Location: [facility];  Service: Orthopedics;  Laterality: Left;   I & D EXTREMITY Left 05/01/2013   Procedure: INCISION AND DRAINAGE LEFT FOREFOOT ABCESS ;  Surgeon: Jessy Oto, MD;  Location: [facility];  Service: Orthopedics;  Laterality: Left;   INCISION AND DRAINAGE ABSCESS Left 12/27/2020   Procedure: INCISION AND DRAINAGE LEFT THIGH ABSCESS;  Surgeon: Clovis Riley, MD;  Location: [facility];  Service: General;  Laterality: Left;   IR PTA VENOUS EXCEPT  DIALYSIS CIRCUIT  01/04/2021   IR VENO/EXT/UNI RIGHT  01/04/2021   Surgery to remove hematoma in L leg     TEE WITHOUT CARDIOVERSION N/A 01/03/2021   Procedure: TRANSESOPHAGEAL ECHOCARDIOGRAM (TEE);  Surgeon: Sueanne Margarita, MD;  Location: [facility]COPY;  Service: Cardiovascular;  Laterality: N/A;   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     OB History   No obstetric history on file.     Family History  Problem Relation Age of Onset   Diabetes Mother    Coronary artery disease Mother     Stroke Mother     Social History   Tobacco Use   Smoking status: Never   Smokeless tobacco: Never  Substance Use Topics   Alcohol use: No   Drug use: No    Home Medications Prior to Admission medications   Medication Sig Start Date End Date Taking? Authorizing Provider  acetaminophen (TYLENOL) 325 MG tablet Take 2 tablets (650 mg total) by mouth every 6 (six) hours. 01/04/21   Domenic Polite, MD  atorvastatin (LIPITOR) 40 MG tablet Take 1 tablet (40 mg total) by mouth daily at 6 PM. 05/12/13   Daleen Bo, Arie Sabina, MD  buPROPion [facility] SR) 100 MG 12 hr tablet Take 100 mg by mouth every morning. 11/12/20   [provider]  buPROPion (WELLBUTRIN XL) 300 MG 24 hr tablet Take 300 mg by mouth daily.    [provider]  collagenase (SANTYL) ointment Apply topically daily. To left leg 01/04/21   Domenic Polite, MD  furosemide (LASIX) 40 MG tablet Take 40 mg by mouth daily. 11/12/20   [provider]  glucose blood test strip Use as instructed 06/26/13   Advani, Vernon Prey, MD  glucose monitoring kit (FREESTYLE) monitoring kit 1 each by Does not apply route as needed for other. 06/26/13   Lorayne Marek, MD  HUMALOG KWIKPEN 100 UNIT/ML KwikPen Inject subcutaneously twice daily as per sliding scale: if 70-150= 0 units; 151-200= 2 u; 201-250= 4 u; 251-300= 6 u; 301-350= 8 u; 351-400= 10 u; 401-450= 12u; anything greater than 400 call physician. 10/27/20   [provider]  hydrOXYzine (VISTARIL) 50 MG capsule Take 50 mg by mouth every 6 (six) hours as needed for itching.    [provider]  insulin glargine (LANTUS) 100 UNIT/ML injection Inject 0.45 mLs (45 Units total) into the skin daily. 01/04/21   Domenic Polite, MD  Iron-FA-B Cmp-C-Biot-Probiotic (FUSION PLUS) CAPS Take 1 capsule by mouth daily.    [provider]  lisinopril (ZESTRIL) 2.5 MG tablet Take 2.5 mg by mouth daily.    [provider]  micafungin (MYCAMINE) 50 MG injection  Inject 150 mg into the vein daily. Indication: candida albicans endocarditis First dose: Yes Last Day of Therapy: 02/14/21 Labs - Once weekly: CBC/D and BMP Labs - Every other week: ESR and CRP Method of administration: Elastomeric Method of administration may be changed at the discretion of home infusion pharmacist based upon assessment of the patient and/or caregiver's ability to self-administer the medication ordered 01/05/21 02/14/21  Raiford Noble Latif, DO  omeprazole (PRILOSEC) 20 MG capsule Take 1 capsule by mouth daily. 11/12/20   [provider]  potassium chloride (KLOR-CON) 10 MEQ tablet Take 10 mEq by mouth daily. 11/12/20   [provider]  promethazine (PHENERGAN) 25 MG tablet Take 25 mg by mouth 2 (two) times daily as needed for nausea/vomiting. 01/12/21   [provider]  SEMGLEE, YFGN, 100  UNIT/ML SOPN Inject 60 Units into the skin daily. 10/29/20   [provider]  SitaGLIPtin-MetFORMIN HCl (JANUMET XR) [phone] MG TB24 Take 1 tablet by mouth daily.    [provider]  traZODone (DESYREL) 50 MG tablet Take 25 mg by mouth at bedtime.    [provider]  venlafaxine XR (EFFEXOR-XR) 75 MG 24 hr capsule Take 75 mg by mouth daily. 11/12/20   [provider]  XARELTO 20 MG TABS tablet Take 20 mg by mouth daily. 11/12/20   [provider]    Allergies    Clindamycin, Zofran, Doxycycline, and Morphine and related  Review of Systems   Review of Systems  Constitutional:  Negative for chills and fever.  HENT:  Negative for ear pain and sore throat.   Eyes:  Negative for pain and visual disturbance.  Respiratory:  Negative for cough and shortness of breath.   Cardiovascular:  Negative for chest pain and palpitations.  Gastrointestinal:  Positive for nausea and vomiting. Negative for abdominal pain.  Genitourinary:  Negative for dysuria and hematuria.  Musculoskeletal:  Positive for myalgias. Negative for arthralgias and  back pain.  Skin:  Positive for wound. Negative for color change and rash.  Neurological:  Negative for seizures and syncope.  All other systems reviewed and are negative.  Physical Exam Updated Vital Signs  ED Triage Vitals [02/01/21 1223]  Enc Vitals Group     BP (!) 157/108     Pulse Rate (!) 116     Resp 18     Temp (!) 100.5 F (38.1 C)     Temp Source Oral     SpO2 97 %     Weight      Height      Head Circumference      Peak Flow      Pain Score      Pain Loc      Pain Edu?      Excl. in [place]?     Physical Exam Vitals and nursing note reviewed.  Constitutional:      General: She is not in acute distress.    Appearance: She is well-developed. She is obese. She is not ill-appearing.  HENT:     Head: Normocephalic and atraumatic.     Nose: Nose normal.     Mouth/Throat:     Mouth: Mucous membranes are moist.  Eyes:     Extraocular Movements: Extraocular movements intact.     Conjunctiva/sclera: Conjunctivae normal.     Pupils: Pupils are equal, round, and reactive to light.  Cardiovascular:     Rate and Rhythm: Regular rhythm. Tachycardia present.     Pulses: Normal pulses.     Heart sounds: Normal heart sounds. No murmur heard. Pulmonary:     Effort: Pulmonary effort is normal. No respiratory distress.     Breath sounds: Normal breath sounds.  Abdominal:     Palpations: Abdomen is soft.     Tenderness: There is no abdominal tenderness.     Comments: [place] without any signs of infection  Musculoskeletal:     Cervical back: Normal range of motion and neck supple.  Skin:    General: Skin is warm and dry.     Capillary Refill: Capillary refill takes less than 2 seconds.     Comments: Surgical site at the left thigh overall with no obvious infectious process, there is 1 spot of redness just above surgical site but for the most part surgical site shows  well granulated tissue there is some mild drainage  Neurological:     General: No focal deficit present.      Mental Status: She is alert.    ED Results / Procedures / Treatments   Labs (all labs ordered are listed, but only abnormal results are displayed) Labs Reviewed  BASIC METABOLIC PANEL - Abnormal; Notable for the following components:      Result Value   Sodium 126 (*)    Chloride 95 (*)    CO2 21 (*)    Glucose, Bld 517 (*)    Creatinine, Ser 1.12 (*)    Calcium 8.7 (*)    All other components within normal limits  CBC - Abnormal; Notable for the following components:   WBC 10.9 (*)    All other components within normal limits  URINALYSIS, ROUTINE W REFLEX MICROSCOPIC - Abnormal; Notable for the following components:   Glucose, UA >=500 (*)    Ketones, ur 5 (*)    Protein, ur >=300 (*)    Bacteria, UA FEW (*)    All other components within normal limits  APTT - Abnormal; Notable for the following components:   aPTT 39 (*)    All other components within normal limits  HEPATIC FUNCTION PANEL - Abnormal; Notable for the following components:   Albumin 2.2 (*)    AST 9 (*)    All other components within normal limits  BETA-HYDROXYBUTYRIC ACID - Abnormal; Notable for the following components:   Beta-Hydroxybutyric Acid 0.37 (*)    All other components within normal limits  CBG MONITORING, ED - Abnormal; Notable for the following components:   Glucose-Capillary 519 (*)    All other components within normal limits  I-STAT BETA HCG BLOOD, ED ([facility], [facility], [facility] ONLY) - Abnormal; Notable for the following components:   I-stat hCG, quantitative 6.7 (*)    All other components within normal limits  URINE CULTURE  CULTURE, BLOOD (ROUTINE X 2)  CULTURE, BLOOD (ROUTINE X 2)  RESP PANEL BY RT-PCR (FLU A&B, COVID) [id number]  AEROBIC/ANAEROBIC CULTURE W GRAM STAIN (SURGICAL/DEEP WOUND)  LACTIC ACID, PLASMA  PROTIME-INR  LACTIC ACID, PLASMA  CBG MONITORING, ED  TROPONIN I (HIGH SENSITIVITY)  TROPONIN I (HIGH SENSITIVITY)    EKG EKG Interpretation  Date/Time:  Tuesday February 01 2021 13:48:30  EDT Ventricular Rate:  111 PR Interval:  156 QRS Duration: 172 QT Interval:  363 QTC Calculation: 494 R Axis:   153 Text Interpretation: Sinus tachycardia RBBB and LPFB Confirmed by Lennice Sites ([id number]) on 02/01/2021 1:54:55 PM  Radiology CT Angio Chest PE W and/or Wo Contrast  Result Date: 02/01/2021 CLINICAL DATA:  Weakness. History of pulmonary emboli. Infected wound of the lower extremity. EXAM: CT ANGIOGRAPHY CHEST WITH CONTRAST TECHNIQUE: Multidetector CT imaging of the chest was performed using the standard protocol during bolus administration of intravenous contrast. Multiplanar CT image reconstructions and MIPs were obtained to evaluate the vascular anatomy. CONTRAST:  155m OMNIPAQUE IOHEXOL 350 MG/ML SOLN COMPARISON:  Chest radiography earlier same day. Previous CT angiography 04/30/2008. FINDINGS: Cardiovascular: The heart is enlarged. No pericardial effusion. Coronary artery calcification is present. Aortic atherosclerotic calcification is present. Pulmonary arterial opacification is moderate. I think there is an embolus in the left upper lobe pulmonary arterial tree serving an area of abnormal lung described below. See coronal image 74. Mediastinum/Nodes: 15 x 22 mm ovoid lesion anterior to the trachea at the thoracic inlet, enlarged since 2009. This could be a lymph node or possibly a  parathyroid adenoma. Few slightly enlarged nodes in the aortopulmonary window. Lungs/Pleura: No pleural effusion. Sub solid consolidation in the anterior aspect of the left upper lobe, the region of lung served by what appears to be an abnormal pulmonary artery probably affected by an embolus. Therefore, this probably represents an area of pulmonary infarction. Lipoid pneumonia was considered, but this would be a very atypical location for that process. Scattered patchy densities elsewhere within the lungs that could be atelectasis or mild patchy infiltrates. Upper Abdomen: Negative Musculoskeletal: No acute  bone finding. Ordinary spinal degenerative changes. Review of the MIP images confirms the above findings. IMPRESSION: Pulmonary embolus demonstrated in the left upper lobe pulmonary arterial tree. This serves the anterior aspect of the left upper lobe, which is affected by patchy density probably representing pulmonary infarction. Enlarging ovoid density anterior to the trachea at the thoracic inlet could be a lymph node or parathyroid adenoma. Aortic Atherosclerosis (ICD10-I70.0). Coronary artery calcification. Electronically Signed   By: Nelson Chimes M.D.   On: 02/01/2021 15:39   CT FEMUR LEFT W CONTRAST  Result Date: 02/01/2021 CLINICAL DATA:  Infected wound at the distal thigh laterally EXAM: CT OF THE LOWER RIGHT EXTREMITY WITH CONTRAST TECHNIQUE: Multidetector CT imaging of the lower right extremity was performed according to the standard protocol following intravenous contrast administration. CONTRAST:  120m OMNIPAQUE IOHEXOL 350 MG/ML SOLN COMPARISON:  CT 12/26/2020 FINDINGS: Bones/Joint/Cartilage No acute fracture or dislocation. 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 Small-moderate left knee joint effusion, nonspecific. Mild degenerative changes of the left hip joint. No bony erosion or periostitis. Ligaments Suboptimally assessed by CT. Muscles and Tendons Fatty atrophy of the left thigh and visualized proximal calf musculature. No acute musculotendinous abnormality is identified. No intramuscular fluid collection. Soft tissues Prominent skin thickening with marked subcutaneous edema most pronounced at the medial aspect of the distal thigh. Similar findings are noted to the contralateral right side, partially included within the field of view. This is similar in appearance to the previous study. Portions of the lateral left thigh were not able to be entirely included within the field of view secondary to patient habitus. Previously seen ill-defined area of fluid and gas within the  anterior aspect of the distal thigh has been debrided. There is a somewhat tubular shaped air cavity within the anterolateral soft tissues of the distal thigh near the site of prior surgical debridement likely reflecting prior surgical tract (series 3, images 251-263). No organized or drainable fluid collections are identified. No additional sites of air or gas within the soft tissues. No left inguinal lymphadenopathy. Extensive vascular calcifications. IMPRESSION: 1. Previously seen ill-defined area of fluid and gas within the anterior aspect of the distal thigh has been surgically debrided. There is a somewhat tubular shaped air cavity within the anterolateral superficial soft tissues of the distal thigh at the surgical debridement site likely reflecting prior surgical incision site. No associated fluid collection to suggest abscess at this location. No additional sites of air or gas within the soft tissues. 2. Prominent skin thickening with marked subcutaneous edema most pronounced at the medial aspect of the distal thigh. Similar findings are noted to the contralateral right side, partially included within the field of view. Findings may reflect chronic lymphedema. 3. No acute osseous findings. 4. 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 Small-moderate left knee joint effusion, nonspecific. Electronically Signed   By: NDavina PokeD.O.   On: 02/01/2021 15:28   DG Chest Port 1 View  Result Date:  02/01/2021 CLINICAL DATA:  Hyperglycemia. EXAM: PORTABLE CHEST 1 VIEW COMPARISON:  09/06/2012 FINDINGS: Right-sided PICC line tip is in the mid SVC at the level of the carina. The cardiac silhouette, mediastinal and hilar contours are within normal limits given the AP projection and portable technique. Patchy left upper lobe airspace opacity suspicious for pneumonia. No pulmonary edema or pleural effusions. No pulmonary lesions. The bony thorax is intact. IMPRESSION: 1. Right-sided PICC line tip  in the mid SVC. 2. Left upper lobe infiltrate. Electronically Signed   By: Marijo Sanes M.D.   On: 02/01/2021 13:41    Procedures .Critical Care  Date/Time: 02/01/2021 1:31 PM Performed by: Lennice Sites, DO Authorized by: Lennice Sites, DO   Critical care provider statement:    Critical care time (minutes):  45   Critical care was necessary to treat or prevent imminent or life-threatening deterioration of the following conditions:  Sepsis   Critical care was time spent personally by me on the following activities:  Blood draw for specimens, development of treatment plan with patient or surrogate, discussions with consultants, discussions with primary provider, evaluation of patient's response to treatment, examination of patient, obtaining history from patient or surrogate, ordering and performing treatments and interventions, ordering and review of laboratory studies, ordering and review of radiographic studies, pulse oximetry, re-evaluation of patient's condition and review of old charts   I assumed direction of critical care for this patient from another provider in my specialty: yes     Care discussed with: admitting provider     Medications Ordered in ED Medications  vancomycin (VANCOCIN) 2,500 mg in sodium chloride 0.9 % 500 mL IVPB (has no administration in time range)  sodium chloride (PF) 0.9 % injection (has no administration in time range)  sodium hypochlorite (DAKIN'S 1/4 STRENGTH) topical solution (has no administration in time range)  sodium chloride 0.9 % bolus 1,000 mL (1,000 mLs Intravenous New Bag/Given 02/01/21 1329)  acetaminophen (TYLENOL) tablet 650 mg (650 mg Oral Given 02/01/21 1330)  fentaNYL (SUBLIMAZE) injection 50 mcg (50 mcg Intravenous Given 02/01/21 1329)  prochlorperazine (COMPAZINE) injection 10 mg (10 mg Intravenous Given 02/01/21 1329)  ceFEPIme (MAXIPIME) 2 g in sodium chloride 0.9 % 100 mL IVPB (2 g Intravenous New Bag/Given 02/01/21 1357)  iohexol  (OMNIPAQUE) 350 MG/ML injection 100 mL (100 mLs Intravenous Contrast Given 02/01/21 1441)    ED Course  I have reviewed the triage vital signs and the nursing notes.  Pertinent labs & imaging results that were available during my care of the patient were reviewed by me and considered in my medical decision making (see chart for details).    MDM Rules/Calculators/A&P                          Karen Bennett is a 45 year old female with history of diabetes who presents to the ED with concern for high blood sugar.  She is found to be febrile and tachycardic but otherwise unremarkable vitals.  She is currently on IV antifungals for Candida bacteremia as well as oral fluconazole.  She had prolonged hospital stay recently for this.  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  She had a left thigh wound that was I&D by general surgery that grew out Klebsiella that was treated with antibiotics.  She has developed a cough over the last 2 days.  She is has some chronic nausea and vomiting since her hospital stay.  Her left thigh  I&D site overall appears well but there is a new area of redness and tenderness just above this area.  Does not appear to be any crepitus or significant process but after talking with general surgery we will get a CT scan of the femur to further evaluate for an abscess.  They will come evaluate as he has not been able to follow-up with her since I&D.  We will do sepsis work-up given that she is high risk for bacteremia again as she does have a PICC line.  Talked with Dr. Baxter Flattery with infectious disease who recommends broad-spectrum IV antibiotics with Vanco and cefepime.  Will evaluate for DKA in the setting of infectious process. Will admit.  Urinalysis appears negative for infection.  White count is 10.9.  Some ketones in the urine.  Patient with blood sugar of 517 but normal anion gap.  Bicarb is 21.  Troponin normal.  Chest x-ray concerning for possible infiltrate.   CT scan of the left thigh did not show any new infectious process.  However, CT scan of the chest did show pulmonary embolism in the left upper lobe of the pulmonary tree.  Likely has pulmonary infarct.  Patient states that she is not been taking her Xarelto for the past month due to her nausea vomiting at times.  Overall we will start heparin for pulmonary embolism.  Will admit to rule out bacteremia.  Broad-spectrum antibiotics have been given.  To be admitted to hospitalist.  This chart was dictated using voice recognition software.  Despite best efforts to proofread,  errors can occur which can change the documentation meaning.    Final Clinical Impression(s) / ED Diagnoses Final diagnoses:  Other acute pulmonary embolism, unspecified whether acute cor pulmonale present ([place])  Sepsis, due to unspecified organism, unspecified whether acute organ dysfunction present [facility])    Rx / DC Orders ED Discharge Orders     None        Lennice Sites, DO 02/01/21 1558

## 2021-02-02 ENCOUNTER — Other Ambulatory Visit: Payer: Self-pay

## 2021-02-02 ENCOUNTER — Encounter (HOSPITAL_COMMUNITY): Payer: Self-pay | Admitting: Family Medicine

## 2021-02-02 DIAGNOSIS — I2699 Other pulmonary embolism without acute cor pulmonale: Secondary | ICD-10-CM

## 2021-02-02 DIAGNOSIS — A419 Sepsis, unspecified organism: Secondary | ICD-10-CM | POA: Diagnosis not present

## 2021-02-02 LAB — GLUCOSE, CAPILLARY
Glucose-Capillary: 368 mg/dL — ABNORMAL HIGH (ref 70–99)
Glucose-Capillary: 269 mg/dL — ABNORMAL HIGH (ref 70–99)
Glucose-Capillary: 123 mg/dL — ABNORMAL HIGH (ref 70–99)
Glucose-Capillary: 204 mg/dL — ABNORMAL HIGH (ref 70–99)
Glucose-Capillary: 193 mg/dL — ABNORMAL HIGH (ref 70–99)

## 2021-02-02 LAB — HEPARIN LEVEL (UNFRACTIONATED)
Heparin Unfractionated: <0.1 IU/mL — ABNORMAL LOW (ref 0.30–0.70)
Heparin Unfractionated: <0.1 IU/mL — ABNORMAL LOW (ref 0.30–0.70)
Heparin Unfractionated: <0.1 IU/mL — ABNORMAL LOW (ref 0.30–0.70)
Heparin Unfractionated: 0.11 IU/mL — ABNORMAL LOW (ref 0.30–0.70)

## 2021-02-02 LAB — BLOOD CULTURE ID PANEL (REFLEXED) - BCID2
A.calcoaceticus-baumannii: DETECTED — AB
Bacteroides fragilis: NOT DETECTED
CTX-M ESBL: NOT DETECTED
Candida albicans: NOT DETECTED
Candida auris: NOT DETECTED
Candida glabrata: NOT DETECTED
Candida krusei: NOT DETECTED
Candida parapsilosis: NOT DETECTED
Candida tropicalis: NOT DETECTED
Carbapenem resistance IMP: NOT DETECTED
Carbapenem resistance KPC: NOT DETECTED
Carbapenem resistance NDM: NOT DETECTED
Carbapenem resistance VIM: NOT DETECTED
Cryptococcus neoformans/gattii: NOT DETECTED
Enterobacter cloacae complex: NOT DETECTED
Enterobacterales: NOT DETECTED
Enterococcus Faecium: NOT DETECTED
Enterococcus faecalis: DETECTED — AB
Escherichia coli: NOT DETECTED
Haemophilus influenzae: NOT DETECTED
Klebsiella aerogenes: NOT DETECTED
Klebsiella oxytoca: NOT DETECTED
Klebsiella pneumoniae: NOT DETECTED
Listeria monocytogenes: NOT DETECTED
Neisseria meningitidis: NOT DETECTED
Proteus species: NOT DETECTED
Pseudomonas aeruginosa: NOT DETECTED
Salmonella species: NOT DETECTED
Serratia marcescens: NOT DETECTED
Staphylococcus aureus (BCID): NOT DETECTED
Staphylococcus epidermidis: NOT DETECTED
Staphylococcus lugdunensis: NOT DETECTED
Staphylococcus species: NOT DETECTED
Stenotrophomonas maltophilia: NOT DETECTED
Streptococcus agalactiae: NOT DETECTED
Streptococcus pneumoniae: NOT DETECTED
Streptococcus pyogenes: NOT DETECTED
Streptococcus species: NOT DETECTED
Vancomycin resistance: DETECTED — AB

## 2021-02-02 LAB — COMPREHENSIVE METABOLIC PANEL
ALT: 8 U/L (ref 0–44)
AST: 9 U/L — ABNORMAL LOW (ref 15–41)
Albumin: 1.8 g/dL — ABNORMAL LOW (ref 3.5–5.0)
Alkaline Phosphatase: 66 U/L (ref 38–126)
Anion gap: 6 (ref 5–15)
BUN: 14 mg/dL (ref 6–20)
CO2: 23 mmol/L (ref 22–32)
Calcium: 7.9 mg/dL — ABNORMAL LOW (ref 8.9–10.3)
Chloride: 97 mmol/L — ABNORMAL LOW (ref 98–111)
Creatinine, Ser: 1.13 mg/dL — ABNORMAL HIGH (ref 0.44–1.00)
GFR, Estimated: >60 mL/min (ref >60)
Glucose, Bld: 468 mg/dL — ABNORMAL HIGH (ref 70–99)
Potassium: 3 mmol/L — ABNORMAL LOW (ref 3.5–5.1)
Sodium: 126 mmol/L — ABNORMAL LOW (ref 135–145)
Total Bilirubin: 0.8 mg/dL (ref 0.3–1.2)
Total Protein: 6.2 g/dL — ABNORMAL LOW (ref 6.5–8.1)

## 2021-02-02 LAB — CBC
HCT: 32.3 % — ABNORMAL LOW (ref 36.0–46.0)
Hemoglobin: 10.8 g/dL — ABNORMAL LOW (ref 12.0–15.0)
MCH: 28.5 pg (ref 26.0–34.0)
MCHC: 33.4 g/dL (ref 30.0–36.0)
MCV: 85.2 fL (ref 80.0–100.0)
Platelets: 231 10*3/uL (ref 150–400)
RBC: 3.79 MIL/uL — ABNORMAL LOW (ref 3.87–5.11)
RDW: 13.3 % (ref 11.5–15.5)
WBC: 11.3 10*3/uL — ABNORMAL HIGH (ref 4.0–10.5)
nRBC: 0 % (ref 0.0–0.2)

## 2021-02-02 LAB — PROCALCITONIN: Procalcitonin: 3.75 ng/mL

## 2021-02-02 LAB — PROTIME-INR
INR: 1.1 (ref 0.8–1.2)
Prothrombin Time: 14 seconds (ref 11.4–15.2)

## 2021-02-02 LAB — HIV ANTIBODY (ROUTINE TESTING W REFLEX): HIV Screen 4th Generation wRfx: NONREACTIVE

## 2021-02-02 LAB — CORTISOL-AM, BLOOD: Cortisol - AM: 10.8 ug/dL (ref 6.7–22.6)

## 2021-02-02 LAB — GLUCOSE, RANDOM: Glucose, Bld: 524 mg/dL (ref 70–99)

## 2021-02-02 MED ORDER — HEPARIN BOLUS VIA INFUSION
3000.0000 [IU] | Freq: Once | INTRAVENOUS | Status: AC
  Administered 2021-02-02: 3000 [IU] via INTRAVENOUS
  Filled 2021-02-02: qty 3000

## 2021-02-02 MED ORDER — INSULIN ASPART 100 UNIT/ML IJ SOLN
15.0000 [IU] | Freq: Once | INTRAMUSCULAR | Status: AC
  Administered 2021-02-02: 15 [IU] via SUBCUTANEOUS

## 2021-02-02 MED ORDER — SODIUM CHLORIDE 0.9 % IV SOLN
8.0000 mg/kg | Freq: Once | INTRAVENOUS | Status: AC
  Administered 2021-02-02: 850 mg via INTRAVENOUS
  Filled 2021-02-02: qty 17

## 2021-02-02 MED ORDER — INSULIN GLARGINE 100 UNIT/ML ~~LOC~~ SOLN
60.0000 [IU] | Freq: Every day | SUBCUTANEOUS | Status: DC
  Administered 2021-02-03 – 2021-02-09 (×7): 60 [IU] via SUBCUTANEOUS
  Filled 2021-02-02 (×7): qty 0.6

## 2021-02-02 MED ORDER — METOPROLOL TARTRATE 25 MG PO TABS
12.5000 mg | ORAL_TABLET | Freq: Two times a day (BID) | ORAL | Status: DC
  Administered 2021-02-02 – 2021-02-09 (×15): 12.5 mg via ORAL
  Filled 2021-02-02 (×15): qty 1

## 2021-02-02 MED ORDER — SODIUM CHLORIDE 0.9 % IV SOLN
25.0000 mg | Freq: Four times a day (QID) | INTRAVENOUS | Status: DC | PRN
  Administered 2021-02-02 – 2021-02-05 (×3): 25 mg via INTRAVENOUS
  Filled 2021-02-02: qty 25
  Filled 2021-02-02: qty 1
  Filled 2021-02-02 (×2): qty 25

## 2021-02-02 MED ORDER — SODIUM CHLORIDE 0.9 % IV SOLN
1200.0000 mg | Freq: Every day | INTRAVENOUS | Status: DC
  Administered 2021-02-02: 1200 mg via INTRAVENOUS
  Filled 2021-02-02 (×2): qty 24

## 2021-02-02 MED ORDER — SODIUM CHLORIDE 0.9% FLUSH
10.0000 mL | INTRAVENOUS | Status: DC | PRN
  Administered 2021-02-05: 3 mL

## 2021-02-02 MED ORDER — PROSOURCE PLUS PO LIQD
30.0000 mL | Freq: Two times a day (BID) | ORAL | Status: DC
  Administered 2021-02-02 – 2021-02-03 (×3): 30 mL via ORAL
  Filled 2021-02-02 (×11): qty 30

## 2021-02-02 MED ORDER — SODIUM CHLORIDE 0.9 % IV SOLN
3.0000 g | Freq: Four times a day (QID) | INTRAVENOUS | Status: DC
  Administered 2021-02-02 – 2021-02-04 (×10): 3 g via INTRAVENOUS
  Filled 2021-02-02 (×2): qty 8
  Filled 2021-02-02 (×5): qty 3
  Filled 2021-02-02 (×2): qty 8
  Filled 2021-02-02: qty 3
  Filled 2021-02-02: qty 8

## 2021-02-02 MED ORDER — ADULT MULTIVITAMIN W/MINERALS CH
1.0000 | ORAL_TABLET | Freq: Every day | ORAL | Status: DC
  Administered 2021-02-03 – 2021-02-09 (×7): 1 via ORAL
  Filled 2021-02-02 (×7): qty 1

## 2021-02-02 MED ORDER — HEPARIN BOLUS VIA INFUSION
3000.0000 [IU] | Freq: Once | INTRAVENOUS | Status: DC
  Filled 2021-02-02: qty 3000

## 2021-02-02 MED ORDER — INSULIN ASPART 100 UNIT/ML IJ SOLN
6.0000 [IU] | Freq: Three times a day (TID) | INTRAMUSCULAR | Status: DC
  Administered 2021-02-02 – 2021-02-08 (×12): 6 [IU] via SUBCUTANEOUS

## 2021-02-02 MED ORDER — HYDROMORPHONE HCL 1 MG/ML IJ SOLN
0.5000 mg | Freq: Once | INTRAMUSCULAR | Status: AC
  Administered 2021-02-05: 0.5 mg via INTRAVENOUS
  Filled 2021-02-02: qty 0.5

## 2021-02-02 MED ORDER — HYDROMORPHONE HCL 1 MG/ML IJ SOLN
0.5000 mg | INTRAMUSCULAR | Status: DC | PRN
  Administered 2021-02-02 – 2021-02-09 (×33): 0.5 mg via INTRAVENOUS
  Filled 2021-02-02 (×33): qty 0.5

## 2021-02-02 MED ORDER — HEPARIN BOLUS VIA INFUSION
3000.0000 [IU] | INTRAVENOUS | Status: AC
  Administered 2021-02-02: 3000 [IU] via INTRAVENOUS
  Filled 2021-02-02: qty 3000

## 2021-02-02 MED ORDER — ENSURE MAX PROTEIN PO LIQD
11.0000 [oz_av] | Freq: Every day | ORAL | Status: DC
  Administered 2021-02-02 – 2021-02-08 (×5): 11 [oz_av] via ORAL
  Filled 2021-02-02 (×8): qty 330

## 2021-02-02 MED ORDER — HEPARIN (PORCINE) 25000 UT/250ML-% IV SOLN
3450.0000 [IU]/h | INTRAVENOUS | Status: DC
  Administered 2021-02-03: 3450 [IU]/h via INTRAVENOUS
  Administered 2021-02-03: 3100 [IU]/h via INTRAVENOUS
  Administered 2021-02-03: 3450 [IU]/h via INTRAVENOUS
  Filled 2021-02-02 (×5): qty 250

## 2021-02-02 MED ORDER — POTASSIUM CHLORIDE 10 MEQ/100ML IV SOLN
10.0000 meq | INTRAVENOUS | Status: AC
  Administered 2021-02-02 (×3): 10 meq via INTRAVENOUS
  Filled 2021-02-02 (×3): qty 100

## 2021-02-02 MED ORDER — CHLORHEXIDINE GLUCONATE CLOTH 2 % EX PADS
6.0000 | MEDICATED_PAD | Freq: Every day | CUTANEOUS | Status: DC
  Administered 2021-02-02 – 2021-02-09 (×6): 6 via TOPICAL

## 2021-02-02 NOTE — Progress Notes (Signed)
ANTICOAGULATION CONSULT NOTE - Brief follow up  Pharmacy Consult for heparin Indication: pulmonary embolus  Patient Measurements: Height: 5\' 6"  (167.6 cm) Weight: (!) 182.3 kg (401 lb 14.4 oz) (01/03/21) IBW/kg (Calculated) : 59.3 Heparin Dosing Weight: 108.5 kg  Assessment: Pharmacy consulted to dose heparin in 45 yo F with CTA: PE in left upper lobe pulmonary arterial tree.  Noted to be non-compliant with Xarelto at home.   See full note earlier today from [facility].  02/02/2021 HL 0.11, remains subtherapeutic despite previous rate increase.   Per RN no bleeding and no further IV line or pump complications. Timing: level ordered for 18:00, but was not received in lab until after 19:36 per the lab tech.  Results should be valid since the sample was run within 2 hours of collection.    Goal of Therapy:  Heparin level 0.3-0.7 units/ml Monitor platelets by anticoagulation protocol: Yes   Plan:  Bolus heparin 3000 units x 1 Increase heparin drip to 3100 units/hr Heparin level in 6 hours Daily CBC  02/04/2021 PharmD, BCPS Clinical Pharmacist [facility] main pharmacy [phone] 02/02/2021 8:15 PM

## 2021-02-02 NOTE — Progress Notes (Signed)
ANTICOAGULATION CONSULT NOTE - follow up  Pharmacy Consult for heparin Indication: pulmonary embolus  Allergies  Allergen Reactions   Clindamycin Nausea And Vomiting   Zofran Itching and Nausea And Vomiting   Doxycycline Nausea And Vomiting   Morphine And Related Itching    Patient Measurements: Weight: (!) 182.3 kg (401 lb 14.4 oz) (01/03/21) Heparin Dosing Weight: 108.5 kg  Vital Signs: Temp: 99.2 F (37.3 C) (07/12 2338) Temp Source: Oral (07/12 2338) BP: 126/63 (07/12 2338) Pulse Rate: 102 (07/12 2338)  Labs: Recent Labs    02/01/21 1233 02/01/21 1255 02/01/21 1556 02/02/21 0116  HGB 13.3  --   --  10.8*  HCT 39.5  --   --  32.3*  PLT 265  --   --  231  APTT 39*  --   --   --   LABPROT 13.2  --   --  14.0  INR 1.0  --   --  1.1  HEPARINUNFRC <0.10*  --   --  <0.10*  CREATININE 1.12*  --   --   --   TROPONINIHS  --  11 14  --      Estimated Creatinine Clearance: 110.9 mL/min (A) (by C-G formula based on SCr of 1.12 mg/dL (H)).   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  Per EDP note "Patient states that she is not been taking her Xarelto for the past month due to her nausea vomiting at times".   Wt from 01/03/21 = 182.3 kg, Ht 66 inches. Baseline labs: aPTT 39 sec, INR 1.0, CBC WNL.   02/02/2021 HL <0.1 subtherapeutic on 2150 units/hr Hgb down to 10.8, Plts WNL Per RN no bleeding or interruptions  Goal of Therapy:  Heparin level 0.3-0.7 units/ml Monitor platelets by anticoagulation protocol: Yes   Plan:  Bolus heparin 3000 units x 1 Increase heparin drip to 2500 units/hr Heparin level in 6 hours Daily CBC  Arley Phenix RPh 02/02/2021, 1:55 AM

## 2021-02-02 NOTE — Progress Notes (Signed)
Patient currently has cardiac orders, MD notified, transfer order placed for telemetry bed. Waiting on room to be cleaned, pt is stable.

## 2021-02-02 NOTE — Progress Notes (Signed)
PROGRESS NOTE    RILEI KRAVITZ  TUU:[id number] DOB: 01/30/1976 DOA: 02/01/2021 PCP: Pablo Lawrence, NP   Brief Narrative: 45 y.o. female with medical history significant of type 2 diabetes mellitus, prior history of DVT/PE, right BKA, super morbid obesity, fungemia, endocarditis who was hospitalized from 12/26/2020 through 01/05/2021 for left lateral thigh abscess and during this hospitalization she was also diagnosed with Candida albicans fungemia/mitral valve endocarditis and she was discharged home on IV micafungin for 6 weeks.  She completed 9 days of IV antibiotics for left thigh abscess which grew Pseudomonas.  She comes to the ER today for advice from the home health nurse which found out that her blood sugar was more than 500.  Although patient herself did not have any complaints.  However having said that, she tells me that she has been having intermittent nausea and vomiting at home since she has been started on antifungals at home.  Initially she claimed that she has not been able to eat or drink anything at home.  When I brought up the fact that she does not look dehydrated for a person who has not had anything to eat for a month.  She felt offended and changed her statement that she has intermittent symptoms and not all the time.  Her friend/neighbor is at the bedside who has been talking for the patient for most part and unfortunately continues to interrupt me from interviewing the patient.  For some reason, they seem to be hostile against medical community and do not seem to be happy with the care they have received so far.  Regarding her, she was not having any chills, sweating or fever at home.   ED Course: Upon arrival to the ED, her temperature was 100.5, she was tachycardic and tachypneic.  Slightly elevated blood pressure as well.  Mild leukocytosis 10.9.  Yet once again hyponatremia with sodium of 126.  CKD stage IIIa at baseline.  Blood sugar 517 but anion gap normal.  No DKA.   Lactic acid normal.  She is not vaccinated for COVID, however COVID test is negative here.  She was already seen by theGeneral surgery in the ED and they evacuated small pocket of purulent drainage from the left lateral thigh.  Cultures were sent.  They recommended dressing changes with Dakin's x72 hours.  Recommended CT of the left thigh which ruled out abscess.  ED physician also discussed the case with Dr. Graylon Good of ID and per her recommendations, patient was started on cefepime and Rocephin.  Hospital service was then consulted to admit the patient for further management.  CT angiogram was also done which once again showed left-sided PE which is chronic.  Patient was started on heparin drip as well.    Assessment & Plan:   Active Problems:   DM (diabetes mellitus), type 2, uncontrolled with complications (HCC)   Pulmonary emboli (HCC)   Anxiety and depression   HTN (hypertension)   Infective endocarditis   Sepsis ([place])  #1 sepsis present on admission secondary to left thigh abscess/VRE bacteremia/fungemia.  She met criteria for sepsis at the time of admission with tachypnea tachycardia fever and leukocytosis.  She is status post incision and drainage of the left thigh abscess with Dakin's solution dressings to be done for daily for 3 days.  Followed by ID and general surgery. ?  DC PICC line  #2 Candida mitral valve endocarditis currently on Diflucan.  #3 uncontrolled type 2 diabetes with hyperglycemia blood sugar remains elevated increase  Lantus to 60 units and increase NovoLog to 6 units 3 times a day before meals. CBG (last 3)  Recent Labs    02/02/21 0545 02/02/21 0714 02/02/21 1114  GLUCAP 368* 269* 123*   #4 CKD stage IIIa at baseline  #5 history of essential hypertension on lisinopril  #6 anxiety and depression on Wellbutrin and Effexor  #7 hyperlipidemia continue statin  #8 hypokalemia replete  #9 chronic pulmonary embolism on heparin restart Xarelto when  stable  #10 pseudo hyponatremia due to hyperglycemia  Pressure Injury 12/27/20 Buttocks Right;Left Stage 2 -  Partial thickness loss of dermis presenting as a shallow open injury with a red, pink wound bed without slough. (Active)  12/27/20 1200  Location: Buttocks  Location Orientation: Right;Left  Staging: Stage 2 -  Partial thickness loss of dermis presenting as a shallow open injury with a red, pink wound bed without slough.  Wound Description (Comments):   Present on Admission:      Estimated body mass index is 64.87 kg/m as calculated from the following:   Height as of this encounter: '5\' 6"'  (1.676 m).   Weight as of this encounter: 182.3 kg.  DVT prophylaxis: IV heparin Code Status: Full code Family Communication: None at bedside Disposition Plan: Home  Remains inpatient appropriate because:IV treatments appropriate due to intensity of illness or inability to take PO  Dispo: The patient is from: Home              Anticipated d/c is to: Home              Patient currently is not medically stable to d/c.   Difficult to place patient No    Consultants: General surgery and infectious disease  Procedures: Incision and drainage of the left thigh abscess 02/02/2021 Antimicrobials: Anti-infectives (From admission, onward)    Start     Dose/Rate Route Frequency Ordered Stop   02/02/21 2000  DAPTOmycin (CUBICIN) 1,200 mg in sodium chloride 0.9 % IVPB        1,200 mg 148 mL/hr over 30 Minutes Intravenous Daily 02/02/21 0805     02/02/21 0530  DAPTOmycin (CUBICIN) 850 mg in sodium chloride 0.9 % IVPB        8 mg/kg  108.5 kg (Adjusted) 134 mL/hr over 30 Minutes Intravenous  Once 02/02/21 0431 02/02/21 0611   02/02/21 0530  Ampicillin-Sulbactam (UNASYN) 3 g in sodium chloride 0.9 % 100 mL IVPB        3 g 200 mL/hr over 30 Minutes Intravenous Every 6 hours 02/02/21 0431     02/02/21 0500  Vancomycin (VANCOCIN) 1,250 mg in sodium chloride 0.9 % 250 mL IVPB  Status:   Discontinued        1,250 mg 166.7 mL/hr over 90 Minutes Intravenous Every 12 hours 02/01/21 1647 02/02/21 0431   02/01/21 2200  ceFEPIme (MAXIPIME) 2 g in sodium chloride 0.9 % 100 mL IVPB  Status:  Discontinued        2 g 200 mL/hr over 30 Minutes Intravenous Every 8 hours 02/01/21 1639 02/02/21 0431   02/01/21 2200  fluconazole (DIFLUCAN) IVPB 400 mg        400 mg 100 mL/hr over 120 Minutes Intravenous Every 24 hours 02/01/21 1732     02/01/21 1800  anidulafungin (ERAXIS) 200 mg in sodium chloride 0.9 % 200 mL IVPB  Status:  Discontinued        200 mg 78 mL/hr over 200 Minutes Intravenous Every 24 hours 02/01/21 1639 02/01/21  1731   02/01/21 1700  ceFEPIme (MAXIPIME) 2 g in sodium chloride 0.9 % 100 mL IVPB  Status:  Discontinued        2 g 200 mL/hr over 30 Minutes Intravenous  Once 02/01/21 1654 02/01/21 1658   02/01/21 1700  vancomycin (VANCOCIN) IVPB 1000 mg/200 mL premix  Status:  Discontinued        1,000 mg 200 mL/hr over 60 Minutes Intravenous  Once 02/01/21 1654 02/01/21 1659   02/01/21 1400  ceFEPIme (MAXIPIME) 2 g in sodium chloride 0.9 % 100 mL IVPB        2 g 200 mL/hr over 30 Minutes Intravenous  Once 02/01/21 1347 02/01/21 1605   02/01/21 1400  vancomycin (VANCOCIN) 2,500 mg in sodium chloride 0.9 % 500 mL IVPB        2,500 mg 250 mL/hr over 120 Minutes Intravenous  Once 02/01/21 1348 02/01/21 1805        Subjective: Patient is resting in bed complaining of 10 out of 10 pain to her left lower extremity requesting something stronger for pain control. Blood culture positive for VRE and Candida albicans on unasyn, Cubicin, Diflucan  Objective: Vitals:   02/01/21 2338 02/02/21 0203 02/02/21 0551 02/02/21 0822  BP: 126/63 (!) 150/71 (!) 156/96 (!) 161/85  Pulse: (!) 102 99 94 94  Resp: '18 20 18 20  ' Temp: 99.2 F (37.3 C) 98.9 F (37.2 C) 99.2 F (37.3 C) 98.7 F (37.1 C)  TempSrc: Oral Oral Oral Oral  SpO2: 93% 94% 95% 95%  Weight:      Height:         Intake/Output Summary (Last 24 hours) at 02/02/2021 1039 Last data filed at 02/02/2021 0600 Gross per 24 hour  Intake 2848.22 ml  Output 1850 ml  Net 998.22 ml   Filed Weights   02/01/21 1409  Weight: (!) 182.3 kg    Examination:  General exam: Appears in distress due to pain Respiratory system: Clear to auscultation. Respiratory effort normal. Cardiovascular system: S1 & S2 heard, RRR. No JVD, murmurs, rubs, gallops or clicks. No pedal edema. Gastrointestinal system: Abdomen is nondistended, soft and nontender. No organomegaly or masses felt. Normal bowel sounds heard. Central nervous system: Alert and oriented. No focal neurological deficits. Extremities: Right BKA, left lower extremity with 1+ edema skin: No rashes, lesions or ulcers Psychiatry: Judgement and insight appear normal. Mood & affect appropriate.     Data Reviewed: I have personally reviewed following labs and imaging studies  CBC: Recent Labs  Lab 02/01/21 1233 02/02/21 0116  WBC 10.9* 11.3*  HGB 13.3 10.8*  HCT 39.5 32.3*  MCV 84.9 85.2  PLT 265 683   Basic Metabolic Panel: Recent Labs  Lab 02/01/21 1233 02/01/21 1650 02/02/21 0116  NA 126*  --  126*  K 3.5  --  3.0*  CL 95*  --  97*  CO2 21*  --  23  GLUCOSE 517* 524* 468*  BUN 12  --  14  CREATININE 1.12*  --  1.13*  CALCIUM 8.7*  --  7.9*  MG  --  1.9  --    GFR: Estimated Creatinine Clearance: 110 mL/min (A) (by C-G formula based on SCr of 1.13 mg/dL (H)). Liver Function Tests: Recent Labs  Lab 02/01/21 1233 02/02/21 0116  AST 9* 9*  ALT 9 8  ALKPHOS 81 66  BILITOT 0.7 0.8  PROT 7.5 6.2*  ALBUMIN 2.2* 1.8*   No results for input(s): LIPASE, AMYLASE in the  last 168 hours. No results for input(s): AMMONIA in the last 168 hours. Coagulation Profile: Recent Labs  Lab 02/01/21 1233 02/02/21 0116  INR 1.0 1.1   Cardiac Enzymes: No results for input(s): CKTOTAL, CKMB, CKMBINDEX, TROPONINI in the last 168 hours. BNP (last  3 results) No results for input(s): PROBNP in the last 8760 hours. HbA1C: No results for input(s): HGBA1C in the last 72 hours. CBG: Recent Labs  Lab 02/01/21 1953 02/01/21 2204 02/01/21 2341 02/02/21 0545 02/02/21 0714  GLUCAP 507* 526* 515* 368* 269*   Lipid Profile: No results for input(s): CHOL, HDL, LDLCALC, TRIG, CHOLHDL, LDLDIRECT in the last 72 hours. Thyroid Function Tests: No results for input(s): TSH, T4TOTAL, FREET4, T3FREE, THYROIDAB in the last 72 hours. Anemia Panel: No results for input(s): VITAMINB12, FOLATE, FERRITIN, TIBC, IRON, RETICCTPCT in the last 72 hours. Sepsis Labs: Recent Labs  Lab 02/01/21 1301 02/01/21 1501 02/02/21 0116  PROCALCITON  --   --  3.75  LATICACIDVEN 1.2 1.3  --     Recent Results (from the past 240 hour(s))  Resp Panel by RT-PCR (Flu A&B, Covid) Nasopharyngeal Swab     Status: None   Collection Time: 02/01/21  1:02 PM   Specimen: Nasopharyngeal Swab; Nasopharyngeal(NP) swabs in vial transport medium  Result Value Ref Range Status   SARS Coronavirus 2 by RT PCR NEGATIVE NEGATIVE Final    Comment: (NOTE) SARS-CoV-2 target nucleic acids are NOT DETECTED.  The SARS-CoV-2 RNA is generally detectable in upper respiratory specimens during the acute phase of infection. The lowest concentration of SARS-CoV-2 viral copies this assay can detect is 138 copies/mL. A negative result does not preclude SARS-Cov-2 infection and should not be used as the sole basis for treatment or other patient management decisions. A negative result may occur with  improper specimen collection/handling, submission of specimen other than nasopharyngeal swab, presence of viral mutation(s) within the areas targeted by this assay, and inadequate number of viral copies(<138 copies/mL). A negative result must be combined with clinical observations, patient history, and epidemiological information. The expected result is Negative.  Fact Sheet for Patients:   EntrepreneurPulse.com.au  Fact Sheet for Healthcare Providers:  IncredibleEmployment.be  This test is no t yet approved or cleared by the [place] FDA and  has been authorized for detection and/or diagnosis of SARS-CoV-2 by FDA under an Emergency Use Authorization (EUA). This EUA will remain  in effect (meaning this test can be used) for the duration of the COVID-19 declaration under Section 564(b)(1) of the Act, 21 U.S.C.section 360bbb-3(b)(1), unless the authorization is terminated  or revoked sooner.       Influenza A by PCR NEGATIVE NEGATIVE Final   Influenza B by PCR NEGATIVE NEGATIVE Final    Comment: (NOTE) The Xpert Xpress SARS-CoV-2/FLU/RSV plus assay is intended as an aid in the diagnosis of influenza from Nasopharyngeal swab specimens and should not be used as a sole basis for treatment. Nasal washings and aspirates are unacceptable for Xpert Xpress SARS-CoV-2/FLU/RSV testing.  Fact Sheet for Patients: EntrepreneurPulse.com.au  Fact Sheet for Healthcare Providers: IncredibleEmployment.be  This test is not yet approved or cleared by the [place] FDA and has been authorized for detection and/or diagnosis of SARS-CoV-2 by FDA under an Emergency Use Authorization (EUA). This EUA will remain in effect (meaning this test can be used) for the duration of the COVID-19 declaration under Section 564(b)(1) of the Act, 21 U.S.C. section 360bbb-3(b)(1), unless the authorization is terminated or revoked.  Performed at [facility], [place] [address] [place], [place] [address]   Blood culture (routine x 2)     Status: None (Preliminary result)   Collection Time: 02/01/21  1:40 PM   Specimen: BLOOD  Result Value Ref Range Status   Specimen Description   Final    BLOOD RIGHT ANTECUBITAL Performed at [facility] [address] [place], [place] [address]     Special Requests   Final    BOTTLES DRAWN AEROBIC AND ANAEROBIC Blood Culture results may not be optimal due to an inadequate volume of blood received in culture bottles Performed at [facility] [address], [place], [place] [address]    Culture  Setup Time   Final    GRAM POSITIVE COCCI IN PAIRS IN BOTH AEROBIC AND ANAEROBIC BOTTLES GRAM NEGATIVE RODS AEROBIC BOTTLE ONLY CRITICAL VALUE NOTED.  VALUE IS CONSISTENT WITH PREVIOUSLY REPORTED AND CALLED VALUE.    Culture   Final    NO GROWTH < 24 HOURS Performed at [facility], [place] [address] [place], [place] [address]    Report Status PENDING  Incomplete  Blood culture (routine x 2)     Status: None (Preliminary result)   Collection Time: 02/01/21  1:48 PM   Specimen: BLOOD RIGHT FOREARM  Result Value Ref Range Status   Specimen Description   Final    BLOOD RIGHT FOREARM Performed at [facility] [address] [place], [place] [address]    Special Requests   Final    BOTTLES DRAWN AEROBIC AND ANAEROBIC Blood Culture adequate volume Performed at [facility] [address], [place], [place] [address]    Culture  Setup Time   Final    GRAM POSITIVE COCCI IN PAIRS IN BOTH AEROBIC AND ANAEROBIC BOTTLES GRAM NEGATIVE RODS AEROBIC BOTTLE ONLY CRITICAL RESULT CALLED TO, READ BACK BY AND VERIFIED WITH: EMariah Milling [id number] 02/02/2021 T. TYSOR    Culture   Final    CULTURE REINCUBATED FOR BETTER GROWTH Performed at [facility], [place] [address] [place], [place] [address]    Report Status PENDING  Incomplete  Blood Culture ID Panel (Reflexed)     Status: Abnormal   Collection Time: 02/01/21  1:48 PM  Result Value Ref Range Status   Enterococcus faecalis DETECTED (A) NOT DETECTED Final    Comment: CRITICAL RESULT CALLED TO, READ BACK BY AND VERIFIED WITH: EMariah Milling 0411 02/02/2021 T. TYSOR    Enterococcus Faecium NOT DETECTED NOT DETECTED Final    Listeria monocytogenes NOT DETECTED NOT DETECTED Final   Staphylococcus species NOT DETECTED NOT DETECTED Final   Staphylococcus aureus (BCID) NOT DETECTED NOT DETECTED Final   Staphylococcus epidermidis NOT DETECTED NOT DETECTED Final   Staphylococcus lugdunensis NOT DETECTED NOT DETECTED Final   Streptococcus species NOT DETECTED NOT DETECTED Final   Streptococcus agalactiae NOT DETECTED NOT DETECTED Final   Streptococcus pneumoniae NOT DETECTED NOT DETECTED Final   Streptococcus pyogenes NOT DETECTED NOT DETECTED Final   A.calcoaceticus-baumannii DETECTED (A) NOT DETECTED Final    Comment: CRITICAL RESULT CALLED TO, READ BACK BY AND VERIFIED WITH: E. JACKSON,PHARMD 0411 02/02/2021 T. TYSOR    Bacteroides fragilis NOT DETECTED NOT DETECTED Final   Enterobacterales NOT DETECTED NOT DETECTED Final   Enterobacter cloacae complex NOT DETECTED NOT DETECTED Final   Escherichia coli NOT DETECTED NOT DETECTED Final   Klebsiella aerogenes NOT DETECTED NOT DETECTED Final   Klebsiella oxytoca NOT DETECTED NOT DETECTED Final  Klebsiella pneumoniae NOT DETECTED NOT DETECTED Final   Proteus species NOT DETECTED NOT DETECTED Final   Salmonella species NOT DETECTED NOT DETECTED Final   Serratia marcescens NOT DETECTED NOT DETECTED Final   Haemophilus influenzae NOT DETECTED NOT DETECTED Final   Neisseria meningitidis NOT DETECTED NOT DETECTED Final   Pseudomonas aeruginosa NOT DETECTED NOT DETECTED Final   Stenotrophomonas maltophilia NOT DETECTED NOT DETECTED Final   Candida albicans NOT DETECTED NOT DETECTED Final   Candida auris NOT DETECTED NOT DETECTED Final   Candida glabrata NOT DETECTED NOT DETECTED Final   Candida krusei NOT DETECTED NOT DETECTED Final   Candida parapsilosis NOT DETECTED NOT DETECTED Final   Candida tropicalis NOT DETECTED NOT DETECTED Final   Cryptococcus neoformans/gattii NOT DETECTED NOT DETECTED Final   CTX-M ESBL NOT DETECTED NOT DETECTED Final   Carbapenem  resistance IMP NOT DETECTED NOT DETECTED Final   Carbapenem resistance KPC NOT DETECTED NOT DETECTED Final   Carbapenem resistance NDM NOT DETECTED NOT DETECTED Final   Vancomycin resistance DETECTED (A) NOT DETECTED Final    Comment: CRITICAL RESULT CALLED TO, READ BACK BY AND VERIFIED WITH: EMariah Milling [id number] 02/02/2021 T. TYSOR    Carbapenem resistance VIM NOT DETECTED NOT DETECTED Final    Comment: Performed at [facility], [address], [place], [place] [address]  Aerobic/Anaerobic Culture w Gram Stain (surgical/deep wound)     Status: None (Preliminary result)   Collection Time: 02/01/21  1:49 PM   Specimen: Abscess  Result Value Ref Range Status   Specimen Description   Final    ABSCESS LEFT LEG Performed at [facility] [address] [place], [place] [address]    Special Requests   Final    Immunocompromised Performed at [facility], [place] [address], [place], [place] [address]    Gram Stain PENDING  Incomplete   Culture   Final    MODERATE GRAM NEGATIVE RODS IDENTIFICATION AND SUSCEPTIBILITIES TO FOLLOW Performed at [facility], [place] [address] [place], [place] [address]    Report Status PENDING  Incomplete         Radiology Studies: CT Angio Chest PE W and/or Wo Contrast  Result Date: 02/01/2021 CLINICAL DATA:  Weakness. History of pulmonary emboli. Infected wound of the lower extremity. EXAM: CT ANGIOGRAPHY CHEST WITH CONTRAST TECHNIQUE: Multidetector CT imaging of the chest was performed using the standard protocol during bolus administration of intravenous contrast. Multiplanar CT image reconstructions and MIPs were obtained to evaluate the vascular anatomy. CONTRAST:  117m OMNIPAQUE IOHEXOL 350 MG/ML SOLN COMPARISON:  Chest radiography earlier same day. Previous CT angiography 04/30/2008. FINDINGS: Cardiovascular: The heart is enlarged. No pericardial effusion. Coronary artery calcification is present.  Aortic atherosclerotic calcification is present. Pulmonary arterial opacification is moderate. I think there is an embolus in the left upper lobe pulmonary arterial tree serving an area of abnormal lung described below. See coronal image 74. Mediastinum/Nodes: 15 x 22 mm ovoid lesion anterior to the trachea at the thoracic inlet, enlarged since 2009. This could be a lymph node or possibly a parathyroid adenoma. Few slightly enlarged nodes in the aortopulmonary window. Lungs/Pleura: No pleural effusion. Sub solid consolidation in the anterior aspect of the left upper lobe, the region of lung served by what appears to be an abnormal pulmonary artery probably affected by an embolus. Therefore, this probably represents an area of pulmonary infarction. Lipoid pneumonia was considered, but this would be a very atypical location for  that process. Scattered patchy densities elsewhere within the lungs that could be atelectasis or mild patchy infiltrates. Upper Abdomen: Negative Musculoskeletal: No acute bone finding. Ordinary spinal degenerative changes. Review of the MIP images confirms the above findings. IMPRESSION: Pulmonary embolus demonstrated in the left upper lobe pulmonary arterial tree. This serves the anterior aspect of the left upper lobe, which is affected by patchy density probably representing pulmonary infarction. Enlarging ovoid density anterior to the trachea at the thoracic inlet could be a lymph node or parathyroid adenoma. Aortic Atherosclerosis (ICD10-I70.0). Coronary artery calcification. Electronically Signed   By: Nelson Chimes M.D.   On: 02/01/2021 15:39   CT FEMUR LEFT W CONTRAST  Result Date: 02/01/2021 CLINICAL DATA:  Infected wound at the distal thigh laterally EXAM: CT OF THE LOWER RIGHT EXTREMITY WITH CONTRAST TECHNIQUE: Multidetector CT imaging of the lower right extremity was performed according to the standard protocol following intravenous contrast administration. CONTRAST:  169m  OMNIPAQUE IOHEXOL 350 MG/ML SOLN COMPARISON:  CT 12/26/2020 FINDINGS: Bones/Joint/Cartilage No acute fracture or dislocation. 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 Small-moderate left knee joint effusion, nonspecific. Mild degenerative changes of the left hip joint. No bony erosion or periostitis. Ligaments Suboptimally assessed by CT. Muscles and Tendons Fatty atrophy of the left thigh and visualized proximal calf musculature. No acute musculotendinous abnormality is identified. No intramuscular fluid collection. Soft tissues Prominent skin thickening with marked subcutaneous edema most pronounced at the medial aspect of the distal thigh. Similar findings are noted to the contralateral right side, partially included within the field of view. This is similar in appearance to the previous study. Portions of the lateral left thigh were not able to be entirely included within the field of view secondary to patient habitus. Previously seen ill-defined area of fluid and gas within the anterior aspect of the distal thigh has been debrided. There is a somewhat tubular shaped air cavity within the anterolateral soft tissues of the distal thigh near the site of prior surgical debridement likely reflecting prior surgical tract (series 3, images 251-263). No organized or drainable fluid collections are identified. No additional sites of air or gas within the soft tissues. No left inguinal lymphadenopathy. Extensive vascular calcifications. IMPRESSION: 1. Previously seen ill-defined area of fluid and gas within the anterior aspect of the distal thigh has been surgically debrided. There is a somewhat tubular shaped air cavity within the anterolateral superficial soft tissues of the distal thigh at the surgical debridement site likely reflecting prior surgical incision site. No associated fluid collection to suggest abscess at this location. No additional sites of air or gas within the soft tissues. 2.  Prominent skin thickening with marked subcutaneous edema most pronounced at the medial aspect of the distal thigh. Similar findings are noted to the contralateral right side, partially included within the field of view. Findings may reflect chronic lymphedema. 3. No acute osseous findings. 4. 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 Small-moderate left knee joint effusion, nonspecific. Electronically Signed   By: NDavina PokeD.O.   On: 02/01/2021 15:28   DG Chest Port 1 View  Result Date: 02/01/2021 CLINICAL DATA:  Hyperglycemia. EXAM: PORTABLE CHEST 1 VIEW COMPARISON:  09/06/2012 FINDINGS: Right-sided PICC line tip is in the mid SVC at the level of the carina. The cardiac silhouette, mediastinal and hilar contours are within normal limits given the AP projection and portable technique. Patchy left upper lobe airspace opacity suspicious for pneumonia. No pulmonary edema or pleural effusions. No pulmonary lesions. The bony  thorax is intact. IMPRESSION: 1. Right-sided PICC line tip in the mid SVC. 2. Left upper lobe infiltrate. Electronically Signed   By: Marijo Sanes M.D.   On: 02/01/2021 13:41        Scheduled Meds:  acetaminophen  650 mg Oral Q6H   atorvastatin  40 mg Oral q1800   buPROPion  100 mg Oral q morning   buPROPion  300 mg Oral Daily   Chlorhexidine Gluconate Cloth  6 each Topical Daily   furosemide  40 mg Oral Daily    HYDROmorphone (DILAUDID) injection  0.5 mg Intravenous Once   insulin aspart  0-20 Units Subcutaneous Q4H   insulin glargine  45 Units Subcutaneous Daily   lisinopril  2.5 mg Oral Daily   pantoprazole  40 mg Oral Daily   sodium hypochlorite   Irrigation BID   traZODone  25 mg Oral QHS   venlafaxine XR  75 mg Oral Daily   Continuous Infusions:  sodium chloride 125 mL/hr at 02/02/21 1036   ampicillin-sulbactam (UNASYN) IV 3 g (02/02/21 5672)   DAPTOmycin (CUBICIN)  IV     fluconazole (DIFLUCAN) IV 400 mg (02/01/21 2303)   heparin 2,800  Units/hr (02/02/21 0937)   promethazine (PHENERGAN) injection (IM or IVPB)       LOS: 1 day    Time spent: 40 minutes    Georgette Shell, MD 02/02/2021, 10:39 AM

## 2021-02-02 NOTE — TOC Initial Note (Signed)
Transition of Care [facility]) - Initial/Assessment Note    Patient Details  Name: Karen Bennett MRN: [id number] Date of Birth: 09/25/1975  Transition of Care [facility]) CM/SW Contact:    Lanier Clam, RN Phone Number: 02/02/2021, 2:13 PM  Clinical Narrative: from home. R BKA. D/c plan return back home.                  Expected Discharge Plan: Home/Self Care Barriers to Discharge: Continued Medical Work up   Patient Goals and CMS Choice Patient states their goals for this hospitalization and ongoing recovery are:: go home CMS Medicare.gov Compare Post Acute Care list provided to:: Patient Choice offered to / list presented to : Patient  Expected Discharge Plan and Services Expected Discharge Plan: Home/Self Care   Discharge Planning Services: CM Consult   Living arrangements for the past 2 months: Single Family Home                                      Prior Living Arrangements/Services Living arrangements for the past 2 months: Single Family Home Lives with:: Self                   Activities of Daily Living Home Assistive Devices/Equipment: CBG Meter, Other (Comment) (electric scooter) ADL Screening (condition at time of admission) Patient's cognitive ability adequate to safely complete daily activities?: Yes Is the patient deaf or have difficulty hearing?: No Does the patient have difficulty seeing, even when wearing glasses/contacts?: No Does the patient have difficulty concentrating, remembering, or making decisions?: No Patient able to express need for assistance with ADLs?: Yes Does the patient have difficulty dressing or bathing?: No Independently performs ADLs?: Yes (appropriate for developmental age) (independent with device) Does the patient have difficulty walking or climbing stairs?: Yes (due to Right AKA) Weakness of Legs: Left Weakness of Arms/Hands: Both  Permission Sought/Granted                  Emotional Assessment               Admission diagnosis:  Sepsis (HCC) [A41.9] Sepsis, due to unspecified organism, unspecified whether acute organ dysfunction present (HCC) [A41.9] Other acute pulmonary embolism, unspecified whether acute cor pulmonale present (HCC) [I26.99] Patient Active Problem List   Diagnosis Date Noted   Sepsis (HCC) 02/01/2021   Infective endocarditis    Pressure injury of skin 12/28/2020   BMI 60.0-69.9, adult (HCC) 12/27/2020   HTN (hypertension) 12/27/2020   History of pulmonary embolism 12/27/2020   Necrotizing fasciitis of lower leg (HCC) Thigh  12/27/2020   Cellulitis 12/26/2020   Anxiety and depression 06/26/2013   Normocytic anemia 05/22/2013   Depression 05/22/2013   Grief reaction 05/22/2013   Acute renal insufficiency 05/07/2013   Phantom limb pain (HCC) 05/06/2013   HLD (hyperlipidemia) 05/02/2013   Diabetic peripheral neuropathy associated with type 2 diabetes mellitus (HCC) 04/30/2013    Class: Chronic   MSSA (methicillin susceptible Staphylococcus aureus) 09/06/2012   Foot osteomyelitis, left (HCC) 09/03/2012   Pulmonary emboli (HCC) 11/11/2011   Diabetic foot ulcer (HCC) 09/05/2011   Obesity, Class III, BMI 40-49.9 (morbid obesity) (HCC) 08/15/2011   DM (diabetes mellitus), type 2, uncontrolled with complications (HCC) 06/12/2007   PCP:  [name], NP Pharmacy:   [id number] - [place], [place] - [address]   [place] [address] Phone: [phone] Fax: [phone]     Social Determinants of Health (SDOH) Interventions    Readmission Risk Interventions Readmission Risk Prevention Plan 01/05/2021  Transportation Screening Complete  PCP or Specialist Appt within 5-7 Days Complete  Home Care Screening Complete  Some recent data might be hidden

## 2021-02-02 NOTE — Progress Notes (Signed)
Report given to [place], Charity fundraiser. Patient being transferred to rm [id number].

## 2021-02-02 NOTE — Progress Notes (Signed)
PHARMACY - PHYSICIAN COMMUNICATION CRITICAL VALUE ALERT - BLOOD CULTURE IDENTIFICATION (BCID)  Karen Bennett is an 45 y.o. female who presented to [facility] on 02/01/2021 with a chief complaint of recent hx of fungal endocarditis and left thigh wound s/p debridement  6/6 . Left thigh wound has new area of redness and tenderness. Noted patient also has a PICC line with some concern for bacteremia/sepsis.  Assessment: 4/4 GPC pairs, enterococcus faecalis vanc resistant and 2/4 GNR aerobic bottle each set A.calcoaceticus-baumannii  Name of physician (or Provider) Contacted: Johann Capers  Current antibiotics: vanc and cefepime  Changes to prescribed antibiotics recommended:  D/c current abx Start daptomycin 8mg /kg (AdjBW) Unasyn 3gm IV q6h  Results for orders placed or performed during the hospital encounter of 02/01/21  Blood Culture ID Panel (Reflexed) (Collected: 02/01/2021  1:48 PM)  Result Value Ref Range   Enterococcus faecalis DETECTED (A) NOT DETECTED   Enterococcus Faecium NOT DETECTED NOT DETECTED   Listeria monocytogenes NOT DETECTED NOT DETECTED   Staphylococcus species NOT DETECTED NOT DETECTED   Staphylococcus aureus (BCID) NOT DETECTED NOT DETECTED   Staphylococcus epidermidis NOT DETECTED NOT DETECTED   Staphylococcus lugdunensis NOT DETECTED NOT DETECTED   Streptococcus species NOT DETECTED NOT DETECTED   Streptococcus agalactiae NOT DETECTED NOT DETECTED   Streptococcus pneumoniae NOT DETECTED NOT DETECTED   Streptococcus pyogenes NOT DETECTED NOT DETECTED   A.calcoaceticus-baumannii DETECTED (A) NOT DETECTED   Bacteroides fragilis NOT DETECTED NOT DETECTED   Enterobacterales NOT DETECTED NOT DETECTED   Enterobacter cloacae complex NOT DETECTED NOT DETECTED   Escherichia coli NOT DETECTED NOT DETECTED   Klebsiella aerogenes NOT DETECTED NOT DETECTED   Klebsiella oxytoca NOT DETECTED NOT DETECTED   Klebsiella pneumoniae NOT DETECTED NOT DETECTED   Proteus species NOT  DETECTED NOT DETECTED   Salmonella species NOT DETECTED NOT DETECTED   Serratia marcescens NOT DETECTED NOT DETECTED   Haemophilus influenzae NOT DETECTED NOT DETECTED   Neisseria meningitidis NOT DETECTED NOT DETECTED   Pseudomonas aeruginosa NOT DETECTED NOT DETECTED   Stenotrophomonas maltophilia NOT DETECTED NOT DETECTED   Candida albicans NOT DETECTED NOT DETECTED   Candida auris NOT DETECTED NOT DETECTED   Candida glabrata NOT DETECTED NOT DETECTED   Candida krusei NOT DETECTED NOT DETECTED   Candida parapsilosis NOT DETECTED NOT DETECTED   Candida tropicalis NOT DETECTED NOT DETECTED   Cryptococcus neoformans/gattii NOT DETECTED NOT DETECTED   CTX-M ESBL NOT DETECTED NOT DETECTED   Carbapenem resistance IMP NOT DETECTED NOT DETECTED   Carbapenem resistance KPC NOT DETECTED NOT DETECTED   Carbapenem resistance NDM NOT DETECTED NOT DETECTED   Vancomycin resistance DETECTED (A) NOT DETECTED   Carbapenem resistance VIM NOT DETECTED NOT DETECTED   04/04/2021 RPh 02/02/2021, 4:38 AM

## 2021-02-02 NOTE — Progress Notes (Addendum)
ANTICOAGULATION CONSULT NOTE - follow up  Pharmacy Consult for heparin Indication: pulmonary embolus  Allergies  Allergen Reactions   Clindamycin Nausea And Vomiting   Zofran Itching and Nausea And Vomiting   Doxycycline Nausea And Vomiting   Morphine And Related Itching    Patient Measurements: Height: 5\' 6"  (167.6 cm) Weight: (!) 182.3 kg (401 lb 14.4 oz) (01/03/21) IBW/kg (Calculated) : 59.3 Heparin Dosing Weight: 108.5 kg  Vital Signs: Temp: 98.7 F (37.1 C) (07/13 0822) Temp Source: Oral (07/13 0822) BP: 161/85 (07/13 0822) Pulse Rate: 94 (07/13 0822)  Labs: Recent Labs    02/01/21 1233 02/01/21 1255 02/01/21 1556 02/02/21 0116 02/02/21 0852  HGB 13.3  --   --  10.8*  --   HCT 39.5  --   --  32.3*  --   PLT 265  --   --  231  --   APTT 39*  --   --   --   --   LABPROT 13.2  --   --  14.0  --   INR 1.0  --   --  1.1  --   HEPARINUNFRC <0.10*  --   --  <0.10* <0.10*  CREATININE 1.12*  --   --  1.13*  --   TROPONINIHS  --  11 14  --   --      Estimated Creatinine Clearance: 110 mL/min (A) (by C-G formula based on SCr of 1.13 mg/dL (H)).   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  Per EDP note "Patient states that she is not been taking her Xarelto for the past month due to her nausea vomiting at times".   Wt from 01/03/21 = 182.3 kg, Ht 66 inches. Baseline labs: aPTT 39 sec, INR 1.0, CBC WNL.   02/02/2021 HL <0.1 subtherapeutic on 2500 units/hr Hgb down to 10.8, Plts WNL Per RN no bleeding  Per RN, IV beeping at beginning of shift but flowing ok  Goal of Therapy:  Heparin level 0.3-0.7 units/ml Monitor platelets by anticoagulation protocol: Yes   Plan:  Bolus heparin 3000 units x 1 Increase heparin drip to 2800  units/hr Heparin level in 6 hours Daily CBC  02/04/2021 D  02/02/2021, 9:25 AM  Addendum  RN discovered after speaking to the patient that the IV pump was beeping for a significant portion of the evening. IV site fixed this AM per RN. Will continue with plan for increased rate as treating for PE w/ level not even close to goal at this point and check an early HL @ 1200 to better assess the situation. Hold bolus.   02/04/2021 D  02/02/21 10:08 AM

## 2021-02-02 NOTE — Progress Notes (Signed)
PHARMACY NOTE:  ANTIMICROBIAL RENAL DOSAGE ADJUSTMENT  Current antimicrobial regimen includes a mismatch between antimicrobial dosage and estimated renal function.  As per policy approved by the Pharmacy & Therapeutics and Medical Executive Committees, the antimicrobial dosage will be adjusted accordingly.  Current antimicrobial dosage:  Daptomycin 8 mg/kg (850 mg) every 24 hours  Indication: VRE bacteremia  Renal Function:  Estimated Creatinine Clearance: 110 mL/min (A) (by C-G formula based on SCr of 1.13 mg/dL (H)). []      On intermittent HD, scheduled: []      On CRRT    Antimicrobial dosage has been changed to:  Daptomycin 1200 mg every 24 hours   Additional comments: Increasing dose to 10 to 12  mg/kg for VRE    Thank you for allowing pharmacy to be a part of this patient's care.  , PharmD, BCPS, BCIDP Infectious Diseases Clinical Pharmacist Phone: [phone] 02/02/2021 8:06 AM

## 2021-02-02 NOTE — Progress Notes (Signed)
Initial Nutrition Assessment  DOCUMENTATION CODES:   Morbid obesity  INTERVENTION:  - will order Ensure Max once/day, each supplement provides 150 kcal and 30 grams protein. - will order 30 ml Prosource Plus BID, each supplement provides 100 kcal and 15 grams protein.  - will order 1 tablet multivitamin with minerals/day. - will monitor for need for education prior to d/c.   NUTRITION DIAGNOSIS:   Inadequate oral intake related to acute illness, nausea as evidenced by per patient/family report.  GOAL:   Patient will meet greater than or equal to 90% of their needs  MONITOR:   PO intake, Supplement acceptance, Labs, Weight trends  REASON FOR ASSESSMENT:   Malnutrition Screening Tool  ASSESSMENT:   45 y.o. female with medical history of type 2 DM, prior history of DVT/PE, R BKA, morbid obesity, fungemia, and endocarditis. She was hospitalized from 6/5-6/15 for L lateral thigh abscess and during this hospitalization she was also diagnosed with Candida albicans fungemia/mitral valve endocarditis and she was discharged home on IV micafungin for 6 weeks. She completed 9 days of IV abx. She presented to the ED d/t CBG of >500 mg/dl and intermittent N/V since IV abx start.  Diet advanced from NPO to Carb Modified yesterday at 1650; no intakes documented since that time.   Unable to see patient at time of attempt. She was last seen by a Goodman RD on 12/31/20 at which time she reported good appetite, eating well. She was eating 100% at meals during that hospitalization. She had reported that she only eats 2 meals/day at home.   Weight yesterday was 402 lb and weight appears to have been copied forward from 01/03/21. Non-pitting edema to LUE and BLE documented in the edema section of flow sheet.   Per notes: - sepsis 2/2 L thigh abscess, bacteremia/fungemia - mitral valve endocarditis  - uncontrolled type 2 DM    Labs reviewed; CBGs: 368, 269, and 123 mg/dl, Na: 644 mmol/l, K: 3  mmol/l, Cl: 97 mmol/l, creatinine: 1.13 mg/dl, Ca: 7.9 mg/dl. Medications reviewed; sliding scale novolog, 15 units novolog x1 dose today, 60 units lantus/day, 40 mg oral protonix/day. IVF; NS @ 75 ml/hr.     NUTRITION - FOCUSED PHYSICAL EXAM:  Unable to complete at this time.   Diet Order:   Diet Order             Diet Carb Modified Fluid consistency: Thin; Room service appropriate? Yes  Diet effective now                   EDUCATION NEEDS:   Not appropriate for education at this time  Skin:  Skin Assessment: Reviewed RN Assessment  Last BM:  7/11  Height:   Ht Readings from Last 1 Encounters:  02/01/21 5\' 6"  (1.676 m)    Weight:   Wt Readings from Last 1 Encounters:  02/01/21 (!) 182.3 kg      Estimated Nutritional Needs:  Kcal:  2200-2400 kcal Protein:  110-125 grams Fluid:  >/= 2.4 L/day       04/04/21, MS, RD, LDN, CNSC Inpatient Clinical Dietitian RD pager # available in [facility]  After hours/weekend pager # available in [facility]

## 2021-02-02 NOTE — Progress Notes (Addendum)
Inpatient Diabetes Program Recommendations  AACE/ADA: New Consensus Statement on Inpatient Glycemic Control (2015)  Target Ranges:  Prepandial:   less than 140 mg/dL      Peak postprandial:   less than 180 mg/dL (1-2 hours)      Critically ill patients:  140 - 180 mg/dL   Lab Results  Component Value Date   GLUCAP 269 (H) 02/02/2021   HGBA1C 15.5 (H) 12/27/2020    Review of Glycemic Control  Diabetes history: DM2 Outpatient Diabetes medications: Lantus 45 units QD, Humalog 2-12 units BID, Janumet 100/1000 mg QD, Semglee 60 units QD Current orders for Inpatient glycemic control: Lantus 45 units QD, Novolog 0-20 Q4H  HgbA1C 15.5% CBGs 368, 269 mg/dL  Inpatient Diabetes Program Recommendations:    Increase Lantus to 50 units QD Add Novolog 6 units TID with meals if eating > 50%.  Will speak with pt today about her diabetes control at home. Diabetes Coordinators have met with pt within the last month during previous admission.   Continue to follow.  Thank you. Lorenda Peck, RD, LDN, CDE Inpatient Diabetes Coordinator [phone]   Addendum: Spoke with pt at bedside about HgbA1C of 15.5% and importance of controlling blood sugars. Discussed diet, monitoring blood sugars and f/u with PCP. Encouraged pt to ask PCP for referral to Endo. Pt states she loves to cook for other people and wants to feel better so she can do her crafts and cooking for others. Answered questions. RV

## 2021-02-02 NOTE — Progress Notes (Signed)
[facility]    Date of Admission:  02/01/2021   Total days of antibiotics 2- amp/sub/dapto/fluc          ID: Karen Bennett is a 45 y.o. female with  fever - picc line associated bacteremia. Also ongoing klebsiella SSTI Active Problems:   DM (diabetes mellitus), type 2, uncontrolled with complications (HCC)   Pulmonary emboli (HCC)   Anxiety and depression   HTN (hypertension)   Infective endocarditis   Sepsis (HCC)    Subjective: Feeling better. Afebrile. Had nausea and vomiting this morning but now improved.  Medications:   (feeding supplement) PROSource Plus  30 mL Oral BID BM   acetaminophen  650 mg Oral Q6H   [START ON 02/03/2021] buPROPion  300 mg Oral Daily   Chlorhexidine Gluconate Cloth  6 each Topical Daily    HYDROmorphone (DILAUDID) injection  0.5 mg Intravenous Once   insulin aspart  0-20 Units Subcutaneous Q4H   [START ON 02/03/2021] insulin glargine  60 Units Subcutaneous Daily   lisinopril  2.5 mg Oral Daily   metoprolol tartrate  12.5 mg Oral BID   multivitamin with minerals  1 tablet Oral Daily   pantoprazole  40 mg Oral Daily   Ensure Max Protein  11 oz Oral Daily   sodium hypochlorite   Irrigation BID   traZODone  25 mg Oral QHS   venlafaxine XR  75 mg Oral Daily    Objective: Vital signs in last 24 hours: Temp:  [98.7 F (37.1 C)-100.5 F (38.1 C)] 99.1 F (37.3 C) (07/13 1559) Pulse Rate:  [94-110] 95 (07/13 1559) Resp:  [16-26] 18 (07/13 1559) BP: (108-161)/(58-96) 140/69 (07/13 1559) SpO2:  [92 %-97 %] 92 % (07/13 1559) Physical Exam  Constitutional:  oriented to person, place, and time. appears well-developed and well-nourished. No distress.  HENT: [place]/AT, PERRLA, no scleral icterus Mouth/Throat: Oropharynx is clear and moist. No oropharyngeal exudate.  Cardiovascular: Normal rate, regular rhythm and normal heart sounds. Exam reveals no gallop and no friction rub.  No murmur heard.  Pulmonary/Chest: Effort  normal and breath sounds normal. No respiratory distress.  has no wheezes.  Neck = supple, no nuchal rigidity Abdominal: Soft. Bowel sounds are normal.  exhibits no distension. There is no tenderness.  Ext: tissue- left thigh - less erythema  Right arm picc line is still in place Neurological: alert and oriented to person, place, and time.  Skin: Skin is warm and dry. No rash noted. No erythema.  Psychiatric: a normal mood and affect.  behavior is normal.    Lab Results Recent Labs    02/01/21 1233 02/02/21 0116  WBC 10.9* 11.3*  HGB 13.3 10.8*  HCT 39.5 32.3*  NA 126* 126*  K 3.5 3.0*  CL 95* 97*  CO2 21* 23  BUN 12 14  CREATININE 1.12* 1.13*   Liver Panel Recent Labs    02/01/21 1233 02/02/21 0116  PROT 7.5 6.2*  ALBUMIN 2.2* 1.8*  AST 9* 9*  ALT 9 8  ALKPHOS 81 66  BILITOT 0.7 0.8  BILIDIR 0.1  --   IBILI 0.6  --     Microbiology: Left leg cx= klebsiella Blood cx = VRE and acinetobacter  Studies/Results: CT Angio Chest PE W and/or Wo Contrast  Result Date: 02/01/2021 CLINICAL DATA:  Weakness. History of pulmonary emboli. Infected wound of the lower extremity. EXAM: CT ANGIOGRAPHY CHEST WITH CONTRAST TECHNIQUE: Multidetector CT imaging of the chest was performed using the  standard protocol during bolus administration of intravenous contrast. Multiplanar CT image reconstructions and MIPs were obtained to evaluate the vascular anatomy. CONTRAST:  OMNIPAQUE IOHEXOL 350 MG/ML SOLN COMPARISON:  Chest radiography earlier same day. Previous CT angiography 04/30/2008. FINDINGS: Cardiovascular: The heart is enlarged. No pericardial effusion. Coronary artery calcification is present. Aortic atherosclerotic calcification is present. Pulmonary arterial opacification is moderate. I think there is an embolus in the left upper lobe pulmonary arterial tree serving an area of abnormal lung described below. See coronal image 74. Mediastinum/Nodes: 15 x 22 mm ovoid lesion  anterior to the trachea at the thoracic inlet, enlarged since 2009. This could be a lymph node or possibly a parathyroid adenoma. Few slightly enlarged nodes in the aortopulmonary window. Lungs/Pleura: No pleural effusion. Sub solid consolidation in the anterior aspect of the left upper lobe, the region of lung served by what appears to be an abnormal pulmonary artery probably affected by an embolus. Therefore, this probably represents an area of pulmonary infarction. Lipoid pneumonia was considered, but this would be a very atypical location for that process. Scattered patchy densities elsewhere within the lungs that could be atelectasis or mild patchy infiltrates. Upper Abdomen: Negative Musculoskeletal: No acute bone finding. Ordinary spinal degenerative changes. Review of the MIP images confirms the above findings. IMPRESSION: Pulmonary embolus demonstrated in the left upper lobe pulmonary arterial tree. This serves the anterior aspect of the left upper lobe, which is affected by patchy density probably representing pulmonary infarction. Enlarging ovoid density anterior to the trachea at the thoracic inlet could be a lymph node or parathyroid adenoma. Aortic Atherosclerosis (ICD10-I70.0). Coronary artery calcification. Electronically Signed   By: Paulina Fusi M.D.   On: 02/01/2021 15:39   CT FEMUR LEFT W CONTRAST  Result Date: 02/01/2021 CLINICAL DATA:  Infected wound at the distal thigh laterally EXAM: CT OF THE LOWER RIGHT EXTREMITY WITH CONTRAST TECHNIQUE: Multidetector CT imaging of the lower right extremity was performed according to the standard protocol following intravenous contrast administration. CONTRAST:  OMNIPAQUE IOHEXOL 350 MG/ML SOLN COMPARISON:  CT 12/26/2020 FINDINGS: Bones/Joint/Cartilage No acute fracture or dislocation. 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 Small-moderate left knee joint effusion, nonspecific. Mild degenerative changes of the left hip joint. No  bony erosion or periostitis. Ligaments Suboptimally assessed by CT. Muscles and Tendons Fatty atrophy of the left thigh and visualized proximal calf musculature. No acute musculotendinous abnormality is identified. No intramuscular fluid collection. Soft tissues Prominent skin thickening with marked subcutaneous edema most pronounced at the medial aspect of the distal thigh. Similar findings are noted to the contralateral right side, partially included within the field of view. This is similar in appearance to the previous study. Portions of the lateral left thigh were not able to be entirely included within the field of view secondary to patient habitus. Previously seen ill-defined area of fluid and gas within the anterior aspect of the distal thigh has been debrided. There is a somewhat tubular shaped air cavity within the anterolateral soft tissues of the distal thigh near the site of prior surgical debridement likely reflecting prior surgical tract (series 3, images 251-263). No organized or drainable fluid collections are identified. No additional sites of air or gas within the soft tissues. No left inguinal lymphadenopathy. Extensive vascular calcifications. IMPRESSION: 1. Previously seen ill-defined area of fluid and gas within the anterior aspect of the distal thigh has been surgically debrided. There is a somewhat tubular shaped air cavity within the anterolateral superficial soft tissues of  the distal thigh at the surgical debridement site likely reflecting prior surgical incision site. No associated fluid collection to suggest abscess at this location. No additional sites of air or gas within the soft tissues. 2. Prominent skin thickening with marked subcutaneous edema most pronounced at the medial aspect of the distal thigh. Similar findings are noted to the contralateral right side, partially included within the field of view. Findings may reflect chronic lymphedema. 3. No acute osseous findings. 4.  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 Small-moderate left knee joint effusion, nonspecific. Electronically Signed   By: Duanne Guess D.O.   On: 02/01/2021 15:28   DG Chest Port 1 View  Result Date: 02/01/2021 CLINICAL DATA:  Hyperglycemia. EXAM: PORTABLE CHEST 1 VIEW COMPARISON:  09/06/2012 FINDINGS: Right-sided PICC line tip is in the mid SVC at the level of the carina. The cardiac silhouette, mediastinal and hilar contours are within normal limits given the AP projection and portable technique. Patchy left upper lobe airspace opacity suspicious for pneumonia. No pulmonary edema or pleural effusions. No pulmonary lesions. The bony thorax is intact. IMPRESSION: 1. Right-sided PICC line tip in the mid SVC. 2. Left upper lobe infiltrate. Electronically Signed   By: Rudie Meyer M.D.   On: 02/01/2021 13:41     Assessment/Plan: Klebsiella soft tissue infection = would be covered with amp/sub  VRE & acinetobacter bacteremia = continue with daptomycin and amp/sub. 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   [facility] for Infectious Diseases Cell: [phone] Pager: [phone]  02/02/2021, 5:06 PM

## 2021-02-02 NOTE — Progress Notes (Signed)
CC: Left thigh wound  Subjective: Patient is left thigh site looks good.  No significant drainage on the dressing this AM.  Being packed with Dakin's.  There is no tenderness or swelling on the medial thigh.  Objective: Vital signs in last 24 hours: Temp:  [98.9 F (37.2 C)-100.5 F (38.1 C)] 99.2 F (37.3 C) (07/13 0551) Pulse Rate:  [94-116] 94 (07/13 0551) Resp:  [16-33] 18 (07/13 0551) BP: (108-195)/(58-108) 156/96 (07/13 0551) SpO2:  [92 %-97 %] 95 % (07/13 0551) Weight:  [182.3 kg] 182.3 kg (07/12 1409) Last BM Date: 01/31/21 720 p.o. 687 IV 1850 urine T-max 100.5; afebrile since 1900 last evening; vital signs stable Glucose 517>> 269 Potassium 3.0 Sodium 126 Creatinine 1.13 WBC 10.9>> 11.3 Blood urine and wound cultures pending CT left femur: Ill-defined areas of fluid and gas within the anterior aspect the distal thigh that has been surgically debrided.  There is a tubular shaped air cavity within the anterior lateral superficial soft tissue of the distal thigh at the surgical debridement site reflecting prior surgical incision.  No associated fluid collections to suggest abscess at this location no additional sites of air or gas within the soft tissue.  Prominent thickening with marked subcutaneous edema most pronounced at the medial aspect distal thigh similar findings noted contralateral side partially occluded within the field-of-view reflecting chronic lymphedema.  No acute osseous findings, tricompartmental osteoarthritis left knee with a small moderate left knee joint effusion CT of the chest shows: Pulmonary embolus demonstrated the left upper pulmonary arterial tree.  This serves the anterior aspect of the left upper lobe which is affected by patchy density probably representing pulmonary infarction. Enlarging ovoid density anterior to the trachea at the thoracic inlet. Aortic atherosclerosis/coronary artery calcification Intake/Output from previous day: 07/12  0701 - 07/13 0700 In: 2848.2 [P.O.:720; I.V.:687.8; IV Piggyback:1440.4] Out: 1850 [Urine:1850] Intake/Output this shift: No intake/output data recorded.  General appearance: alert, cooperative, and no distress Incision/Wound: The wound looks good with wet-to-dry dressing.  Lab Results:  Recent Labs    02/01/21 1233 02/02/21 0116  WBC 10.9* 11.3*  HGB 13.3 10.8*  HCT 39.5 32.3*  PLT 265 231    BMET Recent Labs    02/01/21 1233 02/01/21 1650 02/02/21 0116  NA 126*  --  126*  K 3.5  --  3.0*  CL 95*  --  97*  CO2 21*  --  23  GLUCOSE 517* 524* 468*  BUN 12  --  14  CREATININE 1.12*  --  1.13*  CALCIUM 8.7*  --  7.9*   PT/INR Recent Labs    02/01/21 1233 02/02/21 0116  LABPROT 13.2 14.0  INR 1.0 1.1    Recent Labs  Lab 02/01/21 1233 02/02/21 0116  AST 9* 9*  ALT 9 8  ALKPHOS 81 66  BILITOT 0.7 0.8  PROT 7.5 6.2*  ALBUMIN 2.2* 1.8*     Lipase     Component Value Date/Time   LIPASE 20 09/05/2011 0120     Medications:  acetaminophen  650 mg Oral Q6H   atorvastatin  40 mg Oral q1800   buPROPion  100 mg Oral q morning   buPROPion  300 mg Oral Daily   Chlorhexidine Gluconate Cloth  6 each Topical Daily   furosemide  40 mg Oral Daily   insulin aspart  0-20 Units Subcutaneous Q4H   insulin glargine  45 Units Subcutaneous Daily   lisinopril  2.5 mg Oral Daily   pantoprazole  40 mg Oral Daily   sodium hypochlorite   Irrigation BID   traZODone  25 mg Oral QHS   venlafaxine XR  75 mg Oral Daily    Assessment/Plan s/p I&D of left thigh abscess 12/27/20 - evacuated small pocket of purulent drainage - cultures sent - drainage on dressing concerning for pseudomonal colonization, recommend Dakin's x72 hrs - recommend CT to r/o undrained collection   - no other surgical recommendations at this time, we will follow   FEN: ok to have a diet from surgery standpoint VTE: ok to have chemical prophylaxis from a surgery standpoint ID: [email or web address]/[email or web address] 7/12;  Diflucan 7/12>>day 2; daptomycin 7/13>> Unasyn 7/13>>   - per primary attending - Morbid obesity - BMI 64.9 Hx of bilateral PE 04/29/2008  PE off anticoagulation Uncontrolled T2DM  Plan: Wet-to-dry dressings with Dakin's for 3 days then resume normal saline.  Antibiotics per infectious disease.     LOS: 1 day    Karen Bennett 02/02/2021 Please see Amion

## 2021-02-03 DIAGNOSIS — I2699 Other pulmonary embolism without acute cor pulmonale: Secondary | ICD-10-CM | POA: Diagnosis not present

## 2021-02-03 LAB — GLUCOSE, CAPILLARY
Glucose-Capillary: 169 mg/dL — ABNORMAL HIGH (ref 70–99)
Glucose-Capillary: 196 mg/dL — ABNORMAL HIGH (ref 70–99)
Glucose-Capillary: 214 mg/dL — ABNORMAL HIGH (ref 70–99)
Glucose-Capillary: 225 mg/dL — ABNORMAL HIGH (ref 70–99)
Glucose-Capillary: 249 mg/dL — ABNORMAL HIGH (ref 70–99)
Glucose-Capillary: 263 mg/dL — ABNORMAL HIGH (ref 70–99)

## 2021-02-03 LAB — COMPREHENSIVE METABOLIC PANEL
ALT: 7 U/L (ref 0–44)
AST: 8 U/L — ABNORMAL LOW (ref 15–41)
Albumin: 1.8 g/dL — ABNORMAL LOW (ref 3.5–5.0)
Alkaline Phosphatase: 55 U/L (ref 38–126)
Anion gap: 6 (ref 5–15)
BUN: 16 mg/dL (ref 6–20)
CO2: 24 mmol/L (ref 22–32)
Calcium: 8.4 mg/dL — ABNORMAL LOW (ref 8.9–10.3)
Chloride: 102 mmol/L (ref 98–111)
Creatinine, Ser: 1.04 mg/dL — ABNORMAL HIGH (ref 0.44–1.00)
GFR, Estimated: >60 mL/min (ref >60)
Glucose, Bld: 227 mg/dL — ABNORMAL HIGH (ref 70–99)
Potassium: 3.4 mmol/L — ABNORMAL LOW (ref 3.5–5.1)
Sodium: 132 mmol/L — ABNORMAL LOW (ref 135–145)
Total Bilirubin: 0.4 mg/dL (ref 0.3–1.2)
Total Protein: 6.4 g/dL — ABNORMAL LOW (ref 6.5–8.1)

## 2021-02-03 LAB — CBC
HCT: 32.6 % — ABNORMAL LOW (ref 36.0–46.0)
Hemoglobin: 10.6 g/dL — ABNORMAL LOW (ref 12.0–15.0)
MCH: 28.2 pg (ref 26.0–34.0)
MCHC: 32.5 g/dL (ref 30.0–36.0)
MCV: 86.7 fL (ref 80.0–100.0)
Platelets: 262 10*3/uL (ref 150–400)
RBC: 3.76 MIL/uL — ABNORMAL LOW (ref 3.87–5.11)
RDW: 13.6 % (ref 11.5–15.5)
WBC: 10.9 10*3/uL — ABNORMAL HIGH (ref 4.0–10.5)
nRBC: 0 % (ref 0.0–0.2)

## 2021-02-03 LAB — CK: Total CK: 22 U/L — ABNORMAL LOW (ref 38–234)

## 2021-02-03 LAB — HEPARIN LEVEL (UNFRACTIONATED)
Heparin Unfractionated: 0.18 IU/mL — ABNORMAL LOW (ref 0.30–0.70)
Heparin Unfractionated: 0.22 IU/mL — ABNORMAL LOW (ref 0.30–0.70)
Heparin Unfractionated: 0.23 IU/mL — ABNORMAL LOW (ref 0.30–0.70)

## 2021-02-03 MED ORDER — HEPARIN BOLUS VIA INFUSION
2000.0000 [IU] | Freq: Once | INTRAVENOUS | Status: AC
  Administered 2021-02-03: 2000 [IU] via INTRAVENOUS
  Filled 2021-02-03: qty 2000

## 2021-02-03 MED ORDER — FLUCONAZOLE 100 MG PO TABS
400.0000 mg | ORAL_TABLET | Freq: Every day | ORAL | Status: DC
  Administered 2021-02-03 – 2021-02-08 (×6): 400 mg via ORAL
  Filled 2021-02-03 (×7): qty 4

## 2021-02-03 MED ORDER — LINEZOLID 600 MG PO TABS
600.0000 mg | ORAL_TABLET | Freq: Two times a day (BID) | ORAL | Status: DC
  Administered 2021-02-04 (×2): 600 mg via ORAL
  Filled 2021-02-03 (×2): qty 1

## 2021-02-03 MED ORDER — HEPARIN (PORCINE) 25000 UT/250ML-% IV SOLN
3900.0000 [IU]/h | INTRAVENOUS | Status: AC
  Administered 2021-02-03 (×2): 3800 [IU]/h via INTRAVENOUS
  Filled 2021-02-03 (×3): qty 250

## 2021-02-03 MED ORDER — HEPARIN BOLUS VIA INFUSION
3000.0000 [IU] | Freq: Once | INTRAVENOUS | Status: AC
  Administered 2021-02-03: 3000 [IU] via INTRAVENOUS
  Filled 2021-02-03: qty 3000

## 2021-02-03 NOTE — Progress Notes (Signed)
ANTICOAGULATION CONSULT NOTE - Follow Up Consult  Pharmacy Consult for heparin Indication:  pulmonary embolus  Allergies  Allergen Reactions   Clindamycin Nausea And Vomiting   Zofran Itching and Nausea And Vomiting   Doxycycline Nausea And Vomiting   Morphine And Related Itching    Patient Measurements: Height: 5\' 6"  (167.6 cm) Weight: (!) 182.3 kg (401 lb 14.4 oz) (01/03/21) IBW/kg (Calculated) : 59.3 Heparin Dosing Weight: 108 kg  Vital Signs: Temp: 98.2 F (36.8 C) (07/14 1317) Temp Source: Oral (07/14 1317) BP: 131/72 (07/14 1317) Pulse Rate: 67 (07/14 1317)  Labs: Recent Labs    02/01/21 1233 02/01/21 1255 02/01/21 1556 02/02/21 0116 02/02/21 0852 02/03/21 0340 02/03/21 1245 02/03/21 1700  HGB 13.3  --   --  10.8*  --  10.6*  --   --   HCT 39.5  --   --  32.3*  --  32.6*  --   --   PLT 265  --   --  231  --  262  --   --   APTT 39*  --   --   --   --   --   --   --   LABPROT 13.2  --   --  14.0  --   --   --   --   INR 1.0  --   --  1.1  --   --   --   --   HEPARINUNFRC <0.10*  --   --  <0.10*   < > 0.18* 0.22* 0.23*  CREATININE 1.12*  --   --  1.13*  --  1.04*  --   --   CKTOTAL  --   --   --   --   --  22*  --   --   TROPONINIHS  --  11 14  --   --   --   --   --    < > = values in this interval not displayed.     Estimated Creatinine Clearance: 119.5 mL/min (A) (by C-G formula based on SCr of 1.04 mg/dL (H)).   Medications:  - on xarelto 20mg  daily PTA but was not taking it consistently d/t n/v  Assessment: Patient is a 45 y.o obese F with with complicated ID history (thigh infection,  VRE & acinetobacter bacteremia, Candidal native endocarditis) on IV abx PTA and PE on xarelto PTA, presented to the ED on 7/12 after she was found to be hyperglycemic at her PCP office.  Chest CT on 7/12 showed PE in the left upper lobe.   She's currently on heparin drip for VTE.  Today, 02/03/2021: - heparin level  collected at 12:45p is subtherapeutic at 0.22. Pt's  RN stated that heparin bag ran out this morning so the drip was off for an hour. MAR indicated that drip didn't resume back until 0933.  The 0.22 level was drawn about 3 hr early.   - cbc stable - no bleeding documented 1700 Hep level remains low at 0.23 units/ml, RN notes no interruption of Heparin infusion   Goal of Therapy:  Heparin level 0.3-0.7 units/ml Monitor platelets by anticoagulation protocol: Yes   Plan:  - Rebolus Heparin with 2000 units - Increase Heparin rate to 3800 units/hr - Recheck level in 6 hr - monitor for s/sx bleeding - await transition back to 9/12 PharmD 02/03/2021,5:29 PM

## 2021-02-03 NOTE — Progress Notes (Signed)
PROGRESS NOTE    Karen Bennett  QBH:[id number] DOB: 08-01-1975 DOA: 02/01/2021 PCP: Pablo Lawrence, NP   Brief Narrative: 45 y.o. female with medical history significant of type 2 diabetes mellitus, prior history of DVT/PE, right BKA, super morbid obesity, fungemia, endocarditis who was hospitalized from 12/26/2020 through 01/05/2021 for left lateral thigh abscess and during this hospitalization she was also diagnosed with Candida albicans fungemia/mitral valve endocarditis and she was discharged home on IV micafungin for 6 weeks.  She completed 9 days of IV antibiotics for left thigh abscess which grew Pseudomonas.  She comes to the ER today for advice from the home health nurse which found out that her blood sugar was more than 500.  Although patient herself did not have any complaints.  However having said that, she tells me that she has been having intermittent nausea and vomiting at home since she has been started on antifungals at home.  Initially she claimed that she has not been able to eat or drink anything at home.  When I brought up the fact that she does not look dehydrated for a person who has not had anything to eat for a month.  She felt offended and changed her statement that she has intermittent symptoms and not all the time.  Her friend/neighbor is at the bedside who has been talking for the patient for most part and unfortunately continues to interrupt me from interviewing the patient.  For some reason, they seem to be hostile against medical community and do not seem to be happy with the care they have received so far.  Regarding her, she was not having any chills, sweating or fever at home.   ED Course: Upon arrival to the ED, her temperature was 100.5, she was tachycardic and tachypneic.  Slightly elevated blood pressure as well.  Mild leukocytosis 10.9.  Yet once again hyponatremia with sodium of 126.  CKD stage IIIa at baseline.  Blood sugar 517 but anion gap normal.  No DKA.   Lactic acid normal.  She is not vaccinated for COVID, however COVID test is negative here.  She was already seen by theGeneral surgery in the ED and they evacuated small pocket of purulent drainage from the left lateral thigh.  Cultures were sent.  They recommended dressing changes with Dakin's x72 hours.  Recommended CT of the left thigh which ruled out abscess.  ED physician also discussed the case with Dr. Graylon Good of ID and per her recommendations, patient was started on cefepime and Rocephin.  Hospital service was then consulted to admit the patient for further management.  CT angiogram was also done which once again showed left-sided PE which is chronic.  Patient was started on heparin drip as well.    Assessment & Plan:   Active Problems:   DM (diabetes mellitus), type 2, uncontrolled with complications (HCC)   Pulmonary emboli (HCC)   Anxiety and depression   HTN (hypertension)   Infective endocarditis   Sepsis ([place])  #1 sepsis present on admission secondary to left thigh abscess/VRE bacteremia/fungemia.  She met criteria for sepsis at the time of admission with tachypnea tachycardia fever and leukocytosis.  She is status post incision and drainage of the left thigh abscess with Dakin's solution dressings to be done for daily for 3 days. Klebsiella from left leg culture, VRE and Acinetobacter from blood culture  Followed by ID and general surgery. ?  DC PICC line On Unasyn, daptomycin, fluconazole.  #2 Candida mitral valve endocarditis currently  on Diflucan.  #3 uncontrolled type 2 diabetes with hyperglycemia blood sugar remains elevated increase Lantus to 60 units and increase NovoLog to 6 units 3 times a day before meals. CBG (last 3)  Recent Labs    02/03/21 0412 02/03/21 0713 02/03/21 1135  GLUCAP 225* 263* 196*    #4 CKD stage IIIa at baseline  #5 history of essential hypertension on lisinopril and Lopressor.  #6 anxiety and depression on Wellbutrin and Effexor  #7  hyperlipidemia continue statin  #8 hypokalemia repleted  #9 chronic pulmonary embolism on heparin restart Xarelto when stable  #10 pseudo hyponatremia due to hyperglycemia resolved.  Pressure Injury 12/27/20 Buttocks Right;Left Stage 2 -  Partial thickness loss of dermis presenting as a shallow open injury with a red, pink wound bed without slough. (Active)  12/27/20 1200  Location: Buttocks  Location Orientation: Right;Left  Staging: Stage 2 -  Partial thickness loss of dermis presenting as a shallow open injury with a red, pink wound bed without slough.  Wound Description (Comments):   Present on Admission:      Estimated body mass index is 64.87 kg/m as calculated from the following:   Height as of this encounter: 5' 6" (1.676 m).   Weight as of this encounter: 182.3 kg.  DVT prophylaxis: IV heparin Code Status: Full code Family Communication: None at bedside Disposition Plan: Home  Remains inpatient appropriate because:IV treatments appropriate due to intensity of illness or inability to take PO  Dispo: The patient is from: Home              Anticipated d/c is to: Home              Patient currently is not medically stable to d/c.   Difficult to place patient No    Consultants: General surgery and infectious disease  Procedures: Incision and drainage of the left thigh abscess 02/02/2021 Antimicrobials: Anti-infectives (From admission, onward)    Start     Dose/Rate Route Frequency Ordered Stop   02/02/21 2000  DAPTOmycin (CUBICIN) 1,200 mg in sodium chloride 0.9 % IVPB        1,200 mg 148 mL/hr over 30 Minutes Intravenous Daily 02/02/21 0805     02/02/21 0530  DAPTOmycin (CUBICIN) 850 mg in sodium chloride 0.9 % IVPB        8 mg/kg  108.5 kg (Adjusted) 134 mL/hr over 30 Minutes Intravenous  Once 02/02/21 0431 02/02/21 0611   02/02/21 0530  Ampicillin-Sulbactam (UNASYN) 3 g in sodium chloride 0.9 % 100 mL IVPB        3 g 200 mL/hr over 30 Minutes Intravenous  Every 6 hours 02/02/21 0431     02/02/21 0500  Vancomycin (VANCOCIN) 1,250 mg in sodium chloride 0.9 % 250 mL IVPB  Status:  Discontinued        1,250 mg 166.7 mL/hr over 90 Minutes Intravenous Every 12 hours 02/01/21 1647 02/02/21 0431   02/01/21 2200  ceFEPIme (MAXIPIME) 2 g in sodium chloride 0.9 % 100 mL IVPB  Status:  Discontinued        2 g 200 mL/hr over 30 Minutes Intravenous Every 8 hours 02/01/21 1639 02/02/21 0431   02/01/21 2200  fluconazole (DIFLUCAN) IVPB 400 mg        400 mg 100 mL/hr over 120 Minutes Intravenous Every 24 hours 02/01/21 1732     02/01/21 1800  anidulafungin (ERAXIS) 200 mg in sodium chloride 0.9 % 200 mL IVPB  Status:  Discontinued  200 mg 78 mL/hr over 200 Minutes Intravenous Every 24 hours 02/01/21 1639 02/01/21 1731   02/01/21 1700  ceFEPIme (MAXIPIME) 2 g in sodium chloride 0.9 % 100 mL IVPB  Status:  Discontinued        2 g 200 mL/hr over 30 Minutes Intravenous  Once 02/01/21 1654 02/01/21 1658   02/01/21 1700  vancomycin (VANCOCIN) IVPB 1000 mg/200 mL premix  Status:  Discontinued        1,000 mg 200 mL/hr over 60 Minutes Intravenous  Once 02/01/21 1654 02/01/21 1659   02/01/21 1400  ceFEPIme (MAXIPIME) 2 g in sodium chloride 0.9 % 100 mL IVPB        2 g 200 mL/hr over 30 Minutes Intravenous  Once 02/01/21 1347 02/01/21 1605   02/01/21 1400  vancomycin (VANCOCIN) 2,500 mg in sodium chloride 0.9 % 500 mL IVPB        2,500 mg 250 mL/hr over 120 Minutes Intravenous  Once 02/01/21 1348 02/01/21 1805        Subjective: She is resting in bed appears in less distress compared to yesterday.  Blood culture positive for VRE and Candida albicans on unasyn, Cubicin, Diflucan  Objective: Vitals:   02/02/21 1119 02/02/21 1559 02/02/21 1930 02/03/21 0420  BP: (!) 158/65 140/69 115/64 135/67  Pulse: 95 95 84 88  Resp: _0 Temp: 99.5 F (37.5 C) 99.1 F (37.3 C) 98.6 F (37 C) 99 F (37.2 C)  TempSrc: Oral Oral Oral   SpO2: 96% 92% 92%  94%  Weight:      Height:        Intake/Output Summary (Last 24 hours) at 02/03/2021 1252 Last data filed at 02/03/2021 0800 Gross per 24 hour  Intake 3135.74 ml  Output 2450 ml  Net 685.74 ml    Filed Weights   02/01/21 1409  Weight: (!) 182.3 kg    Examination:  General exam: Appears in distress due to pain Respiratory system: Clear to auscultation. Respiratory effort normal. Cardiovascular system: S1 & S2 heard, RRR. No JVD, murmurs, rubs, gallops or clicks. No pedal edema. Gastrointestinal system: Abdomen is nondistended, soft and nontender. No organomegaly or masses felt. Normal bowel sounds heard. Central nervous system: Alert and oriented. No focal neurological deficits. Extremities: Right BKA, left lower extremity with 1+ edema skin: No rashes, lesions or ulcers Psychiatry: Judgement and insight appear normal. Mood & affect appropriate.     Data Reviewed: I have personally reviewed following labs and imaging studies  CBC: Recent Labs  Lab 02/01/21 1233 02/02/21 0116 02/03/21 0340  WBC 10.9* 11.3* 10.9*  HGB 13.3 10.8* 10.6*  HCT 39.5 32.3* 32.6*  MCV 84.9 85.2 86.7  PLT 265 231 324    Basic Metabolic Panel: Recent Labs  Lab 02/01/21 1233 02/01/21 1650 02/02/21 0116 02/03/21 0340  NA 126*  --  126* 132*  K 3.5  --  3.0* 3.4*  CL 95*  --  97* 102  CO2 21*  --  23 24  GLUCOSE 517* 524* 468* 227*  BUN 12  --  14 16  CREATININE 1.12*  --  1.13* 1.04*  CALCIUM 8.7*  --  7.9* 8.4*  MG  --  1.9  --   --     GFR: Estimated Creatinine Clearance: 119.5 mL/min (A) (by C-G formula based on SCr of 1.04 mg/dL (H)). Liver Function Tests: Recent Labs  Lab 02/01/21 1233 02/02/21 0116 02/03/21 0340  AST 9* 9* 8*  ALT 9  8 7  ALKPHOS 81 66 55  BILITOT 0.7 0.8 0.4  PROT 7.5 6.2* 6.4*  ALBUMIN 2.2* 1.8* 1.8*    No results for input(s): LIPASE, AMYLASE in the last 168 hours. No results for input(s): AMMONIA in the last 168 hours. Coagulation  Profile: Recent Labs  Lab 02/01/21 1233 02/02/21 0116  INR 1.0 1.1    Cardiac Enzymes: Recent Labs  Lab 02/03/21 0340  CKTOTAL 22*   BNP (last 3 results) No results for input(s): PROBNP in the last 8760 hours. HbA1C: No results for input(s): HGBA1C in the last 72 hours. CBG: Recent Labs  Lab 02/02/21 1933 02/03/21 0021 02/03/21 0412 02/03/21 0713 02/03/21 1135  GLUCAP 193* 249* 225* 263* 196*    Lipid Profile: No results for input(s): CHOL, HDL, LDLCALC, TRIG, CHOLHDL, LDLDIRECT in the last 72 hours. Thyroid Function Tests: No results for input(s): TSH, T4TOTAL, FREET4, T3FREE, THYROIDAB in the last 72 hours. Anemia Panel: No results for input(s): VITAMINB12, FOLATE, FERRITIN, TIBC, IRON, RETICCTPCT in the last 72 hours. Sepsis Labs: Recent Labs  Lab 02/01/21 1301 02/01/21 1501 02/02/21 0116  PROCALCITON  --   --  3.75  LATICACIDVEN 1.2 1.3  --      Recent Results (from the past 240 hour(s))  Urine culture     Status: Abnormal (Preliminary result)   Collection Time: 02/01/21 12:33 PM   Specimen: In/Out Cath Urine  Result Value Ref Range Status   Specimen Description   Final    IN/OUT CATH URINE Performed at [facility], [place] [address], [place], [place] [address]    Special Requests   Final    NONE Performed at [facility], [place] [address], [place], [place] [address]    Culture >=100,000 COLONIES/mL KLEBSIELLA PNEUMONIAE (A)  Final   Report Status PENDING  Incomplete  Resp Panel by RT-PCR (Flu A&B, Covid) Nasopharyngeal Swab     Status: None   Collection Time: 02/01/21  1:02 PM   Specimen: Nasopharyngeal Swab; Nasopharyngeal(NP) swabs in vial transport medium  Result Value Ref Range Status   SARS Coronavirus 2 by RT PCR NEGATIVE NEGATIVE Final    Comment: (NOTE) SARS-CoV-2 target nucleic acids are NOT DETECTED.  The SARS-CoV-2 RNA is generally detectable in upper respiratory specimens during the acute phase of  infection. The lowest concentration of SARS-CoV-2 viral copies this assay can detect is 138 copies/mL. A negative result does not preclude SARS-Cov-2 infection and should not be used as the sole basis for treatment or other patient management decisions. A negative result may occur with  improper specimen collection/handling, submission of specimen other than nasopharyngeal swab, presence of viral mutation(s) within the areas targeted by this assay, and inadequate number of viral copies(<138 copies/mL). A negative result must be combined with clinical observations, patient history, and epidemiological information. The expected result is Negative.  Fact Sheet for Patients:  EntrepreneurPulse.com.au  Fact Sheet for Healthcare Providers:  IncredibleEmployment.be  This test is no t yet approved or cleared by the [place] FDA and  has been authorized for detection and/or diagnosis of SARS-CoV-2 by FDA under an Emergency Use Authorization (EUA). This EUA will remain  in effect (meaning this test can be used) for the duration of the COVID-19 declaration under Section 564(b)(1) of the Act, 21 U.S.C.section 360bbb-3(b)(1), unless the authorization is terminated  or revoked sooner.       Influenza A by PCR NEGATIVE NEGATIVE Final   Influenza B by PCR NEGATIVE NEGATIVE Final  Comment: (NOTE) The Xpert Xpress SARS-CoV-2/FLU/RSV plus assay is intended as an aid in the diagnosis of influenza from Nasopharyngeal swab specimens and should not be used as a sole basis for treatment. Nasal washings and aspirates are unacceptable for Xpert Xpress SARS-CoV-2/FLU/RSV testing.  Fact Sheet for Patients: EntrepreneurPulse.com.au  Fact Sheet for Healthcare Providers: IncredibleEmployment.be  This test is not yet approved or cleared by the [place] FDA and has been authorized for detection and/or diagnosis of SARS-CoV-2  by FDA under an Emergency Use Authorization (EUA). This EUA will remain in effect (meaning this test can be used) for the duration of the COVID-19 declaration under Section 564(b)(1) of the Act, 21 U.S.C. section 360bbb-3(b)(1), unless the authorization is terminated or revoked.  Performed at [facility], [place] [address], [place], [place] [address]   Blood culture (routine x 2)     Status: Abnormal (Preliminary result)   Collection Time: 02/01/21  1:40 PM   Specimen: BLOOD  Result Value Ref Range Status   Specimen Description   Final    BLOOD RIGHT ANTECUBITAL Performed at [place] [address], [place], [place] [address]    Special Requests   Final    BOTTLES DRAWN AEROBIC AND ANAEROBIC Blood Culture results may not be optimal due to an inadequate volume of blood received in culture bottles Performed at [place] [address] [place], [place] [address]    Culture  Setup Time   Final    GRAM POSITIVE COCCI IN PAIRS IN BOTH AEROBIC AND ANAEROBIC BOTTLES GRAM NEGATIVE RODS AEROBIC BOTTLE ONLY CRITICAL VALUE NOTED.  VALUE IS CONSISTENT WITH PREVIOUSLY REPORTED AND CALLED VALUE. Performed at [facility], [place] [address], [place], [place] [address]    Culture (A)  Final    ACINETOBACTER CALCOACETICUS/BAUMANNII COMPLEX ENTEROCOCCUS FAECALIS    Report Status PENDING  Incomplete  Blood culture (routine x 2)     Status: Abnormal (Preliminary result)   Collection Time: 02/01/21  1:48 PM   Specimen: BLOOD RIGHT FOREARM  Result Value Ref Range Status   Specimen Description   Final    BLOOD RIGHT FOREARM Performed at [place] [address] [place], [place] [address]    Special Requests   Final    BOTTLES DRAWN AEROBIC AND ANAEROBIC Blood Culture adequate volume Performed at [place] [address], [place], [place] [address]    Culture  Setup Time   Final     GRAM POSITIVE COCCI IN PAIRS IN BOTH AEROBIC AND ANAEROBIC BOTTLES GRAM NEGATIVE RODS AEROBIC BOTTLE ONLY CRITICAL RESULT CALLED TO, READ BACK BY AND VERIFIED WITH: EMariah Milling 0321 02/02/2021 T. TYSOR    Culture (A)  Final    ACINETOBACTER CALCOACETICUS/BAUMANNII COMPLEX ENTEROCOCCUS FAECALIS SUSCEPTIBILITIES TO FOLLOW Performed at [facility], [place] [address] [place], [place] [address]    Report Status PENDING  Incomplete  Blood Culture ID Panel (Reflexed)     Status: Abnormal   Collection Time: 02/01/21  1:48 PM  Result Value Ref Range Status   Enterococcus faecalis DETECTED (A) NOT DETECTED Final    Comment: CRITICAL RESULT CALLED TO, READ BACK BY AND VERIFIED WITH: E. JACKSON,PHARMD 0411 02/02/2021 T. TYSOR    Enterococcus Faecium NOT DETECTED NOT DETECTED Final   Listeria monocytogenes NOT DETECTED NOT DETECTED Final   Staphylococcus species NOT DETECTED NOT DETECTED Final   Staphylococcus aureus (BCID) NOT DETECTED NOT DETECTED Final   Staphylococcus  epidermidis NOT DETECTED NOT DETECTED Final   Staphylococcus lugdunensis NOT DETECTED NOT DETECTED Final   Streptococcus species NOT DETECTED NOT DETECTED Final   Streptococcus agalactiae NOT DETECTED NOT DETECTED Final   Streptococcus pneumoniae NOT DETECTED NOT DETECTED Final   Streptococcus pyogenes NOT DETECTED NOT DETECTED Final   A.calcoaceticus-baumannii DETECTED (A) NOT DETECTED Final    Comment: CRITICAL RESULT CALLED TO, READ BACK BY AND VERIFIED WITH: E. JACKSON,PHARMD 0411 02/02/2021 T. TYSOR    Bacteroides fragilis NOT DETECTED NOT DETECTED Final   Enterobacterales NOT DETECTED NOT DETECTED Final   Enterobacter cloacae complex NOT DETECTED NOT DETECTED Final   Escherichia coli NOT DETECTED NOT DETECTED Final   Klebsiella aerogenes NOT DETECTED NOT DETECTED Final   Klebsiella oxytoca NOT DETECTED NOT DETECTED Final   Klebsiella pneumoniae NOT DETECTED NOT DETECTED Final   Proteus species NOT  DETECTED NOT DETECTED Final   Salmonella species NOT DETECTED NOT DETECTED Final   Serratia marcescens NOT DETECTED NOT DETECTED Final   Haemophilus influenzae NOT DETECTED NOT DETECTED Final   Neisseria meningitidis NOT DETECTED NOT DETECTED Final   Pseudomonas aeruginosa NOT DETECTED NOT DETECTED Final   Stenotrophomonas maltophilia NOT DETECTED NOT DETECTED Final   Candida albicans NOT DETECTED NOT DETECTED Final   Candida auris NOT DETECTED NOT DETECTED Final   Candida glabrata NOT DETECTED NOT DETECTED Final   Candida krusei NOT DETECTED NOT DETECTED Final   Candida parapsilosis NOT DETECTED NOT DETECTED Final   Candida tropicalis NOT DETECTED NOT DETECTED Final   Cryptococcus neoformans/gattii NOT DETECTED NOT DETECTED Final   CTX-M ESBL NOT DETECTED NOT DETECTED Final   Carbapenem resistance IMP NOT DETECTED NOT DETECTED Final   Carbapenem resistance KPC NOT DETECTED NOT DETECTED Final   Carbapenem resistance NDM NOT DETECTED NOT DETECTED Final   Vancomycin resistance DETECTED (A) NOT DETECTED Final    Comment: CRITICAL RESULT CALLED TO, READ BACK BY AND VERIFIED WITH: EMariah Milling 8016 02/02/2021 T. TYSOR    Carbapenem resistance VIM NOT DETECTED NOT DETECTED Final    Comment: Performed at [facility], [address], [place], [place] [address]  Aerobic/Anaerobic Culture w Gram Stain (surgical/deep wound)     Status: None (Preliminary result)   Collection Time: 02/01/21  1:49 PM   Specimen: Abscess  Result Value Ref Range Status   Specimen Description   Final    ABSCESS LEFT LEG Performed at [place] [address] [place], [place] [address]    Special Requests   Final    Immunocompromised Performed at [facility], [place] [address], [place], [place] [address]    Gram Stain   Final    FEW WBC PRESENT,BOTH PMN AND MONONUCLEAR FEW GRAM NEGATIVE RODS Performed at [facility], [place] [address], [place], [place]  [address]    Culture   Final    MODERATE KLEBSIELLA PNEUMONIAE CULTURE REINCUBATED FOR BETTER GROWTH NO ANAEROBES ISOLATED; CULTURE IN PROGRESS FOR 5 DAYS    Report Status PENDING  Incomplete   Organism ID, Bacteria KLEBSIELLA PNEUMONIAE  Final      Susceptibility   Klebsiella pneumoniae - MIC*    AMPICILLIN >=32 RESISTANT Resistant     CEFAZOLIN <=4 SENSITIVE Sensitive     CEFEPIME <=0.12 SENSITIVE Sensitive     CEFTAZIDIME <=1 SENSITIVE Sensitive     CEFTRIAXONE <=0.25 SENSITIVE Sensitive     CIPROFLOXACIN <=0.25 SENSITIVE Sensitive     GENTAMICIN <=1 SENSITIVE Sensitive  IMIPENEM <=0.25 SENSITIVE Sensitive     TRIMETH/SULFA <=20 SENSITIVE Sensitive     AMPICILLIN/SULBACTAM 4 SENSITIVE Sensitive     PIP/TAZO <=4 SENSITIVE Sensitive     * MODERATE KLEBSIELLA PNEUMONIAE          Radiology Studies: CT Angio Chest PE W and/or Wo Contrast  Result Date: 02/01/2021 CLINICAL DATA:  Weakness. History of pulmonary emboli. Infected wound of the lower extremity. EXAM: CT ANGIOGRAPHY CHEST WITH CONTRAST TECHNIQUE: Multidetector CT imaging of the chest was performed using the standard protocol during bolus administration of intravenous contrast. Multiplanar CT image reconstructions and MIPs were obtained to evaluate the vascular anatomy. CONTRAST:  110m OMNIPAQUE IOHEXOL 350 MG/ML SOLN COMPARISON:  Chest radiography earlier same day. Previous CT angiography 04/30/2008. FINDINGS: Cardiovascular: The heart is enlarged. No pericardial effusion. Coronary artery calcification is present. Aortic atherosclerotic calcification is present. Pulmonary arterial opacification is moderate. I think there is an embolus in the left upper lobe pulmonary arterial tree serving an area of abnormal lung described below. See coronal image 74. Mediastinum/Nodes: 15 x 22 mm ovoid lesion anterior to the trachea at the thoracic inlet, enlarged since 2009. This could be a lymph node or possibly a parathyroid adenoma. Few  slightly enlarged nodes in the aortopulmonary window. Lungs/Pleura: No pleural effusion. Sub solid consolidation in the anterior aspect of the left upper lobe, the region of lung served by what appears to be an abnormal pulmonary artery probably affected by an embolus. Therefore, this probably represents an area of pulmonary infarction. Lipoid pneumonia was considered, but this would be a very atypical location for that process. Scattered patchy densities elsewhere within the lungs that could be atelectasis or mild patchy infiltrates. Upper Abdomen: Negative Musculoskeletal: No acute bone finding. Ordinary spinal degenerative changes. Review of the MIP images confirms the above findings. IMPRESSION: Pulmonary embolus demonstrated in the left upper lobe pulmonary arterial tree. This serves the anterior aspect of the left upper lobe, which is affected by patchy density probably representing pulmonary infarction. Enlarging ovoid density anterior to the trachea at the thoracic inlet could be a lymph node or parathyroid adenoma. Aortic Atherosclerosis (ICD10-I70.0). Coronary artery calcification. Electronically Signed   By: MNelson ChimesM.D.   On: 02/01/2021 15:39   CT FEMUR LEFT W CONTRAST  Result Date: 02/01/2021 CLINICAL DATA:  Infected wound at the distal thigh laterally EXAM: CT OF THE LOWER RIGHT EXTREMITY WITH CONTRAST TECHNIQUE: Multidetector CT imaging of the lower right extremity was performed according to the standard protocol following intravenous contrast administration. CONTRAST:  1064mOMNIPAQUE IOHEXOL 350 MG/ML SOLN COMPARISON:  CT 12/26/2020 FINDINGS: Bones/Joint/Cartilage No acute fracture or dislocation. 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 Small-moderate left knee joint effusion, nonspecific. Mild degenerative changes of the left hip joint. No bony erosion or periostitis. Ligaments Suboptimally assessed by CT. Muscles and Tendons Fatty atrophy of the left thigh and  visualized proximal calf musculature. No acute musculotendinous abnormality is identified. No intramuscular fluid collection. Soft tissues Prominent skin thickening with marked subcutaneous edema most pronounced at the medial aspect of the distal thigh. Similar findings are noted to the contralateral right side, partially included within the field of view. This is similar in appearance to the previous study. Portions of the lateral left thigh were not able to be entirely included within the field of view secondary to patient habitus. Previously seen ill-defined area of fluid and gas within the anterior aspect of the distal thigh has been debrided. There is a somewhat tubular shaped  air cavity within the anterolateral soft tissues of the distal thigh near the site of prior surgical debridement likely reflecting prior surgical tract (series 3, images 251-263). No organized or drainable fluid collections are identified. No additional sites of air or gas within the soft tissues. No left inguinal lymphadenopathy. Extensive vascular calcifications. IMPRESSION: 1. Previously seen ill-defined area of fluid and gas within the anterior aspect of the distal thigh has been surgically debrided. There is a somewhat tubular shaped air cavity within the anterolateral superficial soft tissues of the distal thigh at the surgical debridement site likely reflecting prior surgical incision site. No associated fluid collection to suggest abscess at this location. No additional sites of air or gas within the soft tissues. 2. Prominent skin thickening with marked subcutaneous edema most pronounced at the medial aspect of the distal thigh. Similar findings are noted to the contralateral right side, partially included within the field of view. Findings may reflect chronic lymphedema. 3. No acute osseous findings. 4. 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 Small-moderate left knee joint effusion, nonspecific. Electronically  Signed   By: Davina Poke D.O.   On: 02/01/2021 15:28   DG Chest Port 1 View  Result Date: 02/01/2021 CLINICAL DATA:  Hyperglycemia. EXAM: PORTABLE CHEST 1 VIEW COMPARISON:  09/06/2012 FINDINGS: Right-sided PICC line tip is in the mid SVC at the level of the carina. The cardiac silhouette, mediastinal and hilar contours are within normal limits given the AP projection and portable technique. Patchy left upper lobe airspace opacity suspicious for pneumonia. No pulmonary edema or pleural effusions. No pulmonary lesions. The bony thorax is intact. IMPRESSION: 1. Right-sided PICC line tip in the mid SVC. 2. Left upper lobe infiltrate. Electronically Signed   By: Marijo Sanes M.D.   On: 02/01/2021 13:41        Scheduled Meds:  (feeding supplement) PROSource Plus  30 mL Oral BID BM   acetaminophen  650 mg Oral Q6H   buPROPion  300 mg Oral Daily   Chlorhexidine Gluconate Cloth  6 each Topical Daily    HYDROmorphone (DILAUDID) injection  0.5 mg Intravenous Once   insulin aspart  0-20 Units Subcutaneous Q4H   insulin aspart  6 Units Subcutaneous TID WC   insulin glargine  60 Units Subcutaneous Daily   lisinopril  2.5 mg Oral Daily   metoprolol tartrate  12.5 mg Oral BID   multivitamin with minerals  1 tablet Oral Daily   pantoprazole  40 mg Oral Daily   Ensure Max Protein  11 oz Oral Daily   sodium hypochlorite   Irrigation BID   traZODone  25 mg Oral QHS   venlafaxine XR  75 mg Oral Daily   Continuous Infusions:  sodium chloride 75 mL/hr at 02/03/21 0639   ampicillin-sulbactam (UNASYN) IV 3 g (02/03/21 1125)   DAPTOmycin (CUBICIN)  IV 1,200 mg (02/02/21 2048)   fluconazole (DIFLUCAN) IV 400 mg (02/02/21 2226)   heparin 3,450 Units/hr (02/03/21 0933)   promethazine (PHENERGAN) injection (IM or IVPB) 25 mg (02/02/21 1122)     LOS: 2 days    Time spent: 40 minutes    Georgette Shell, MD 02/03/2021, 12:52 PM

## 2021-02-03 NOTE — Progress Notes (Signed)
ANTICOAGULATION CONSULT NOTE - follow up  Pharmacy Consult for heparin Indication: pulmonary embolus  Allergies  Allergen Reactions   Clindamycin Nausea And Vomiting   Zofran Itching and Nausea And Vomiting   Doxycycline Nausea And Vomiting   Morphine And Related Itching    Patient Measurements: Height: 5\' 6"  (167.6 cm) Weight: (!) 182.3 kg (401 lb 14.4 oz) (01/03/21) IBW/kg (Calculated) : 59.3 Heparin Dosing Weight: 108.5 kg  Vital Signs: Temp: 98.6 F (37 C) (07/13 1930) Temp Source: Oral (07/13 1930) BP: 115/64 (07/13 1930) Pulse Rate: 84 (07/13 1930)  Labs: Recent Labs    02/01/21 1233 02/01/21 1255 02/01/21 1556 02/02/21 0116 02/02/21 0852 02/02/21 1150 02/02/21 1800 02/03/21 0340  HGB 13.3  --   --  10.8*  --   --   --  10.6*  HCT 39.5  --   --  32.3*  --   --   --  32.6*  PLT 265  --   --  231  --   --   --  262  APTT 39*  --   --   --   --   --   --   --   LABPROT 13.2  --   --  14.0  --   --   --   --   INR 1.0  --   --  1.1  --   --   --   --   HEPARINUNFRC <0.10*  --   --  <0.10*   < > <0.10* 0.11* 0.18*  CREATININE 1.12*  --   --  1.13*  --   --   --   --   TROPONINIHS  --  11 14  --   --   --   --   --    < > = values in this interval not displayed.     Estimated Creatinine Clearance: 110 mL/min (A) (by C-G formula based on SCr of 1.13 mg/dL (H)).   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  Per EDP note "Patient states that she is not been taking her Xarelto for the past month due to her nausea vomiting at times".   Wt from 01/03/21 = 182.3 kg, Ht 66 inches. Baseline labs: aPTT 39 sec, INR 1.0, CBC WNL.   02/03/2021 HL 0.18 subtherapeutic on 3100 units/hr Hgb down to 10.6, Plts WNL Per RN no bleeding or line  interruptions   Goal of Therapy:  Heparin level 0.3-0.7 units/ml Monitor platelets by anticoagulation protocol: Yes   Plan:  Bolus heparin 3000 units x 1 Increase heparin drip to 3450 units/hr Heparin level in 6 hours Daily CBC  02/05/2021 RPh 02/03/2021, 4:18 AM

## 2021-02-03 NOTE — Progress Notes (Signed)
[facility]    Date of Admission:  02/01/2021   Total days of antibiotics 3           ID: Karen Bennett is a 45 y.o. female with   Active Problems:   DM (diabetes mellitus), type 2, uncontrolled with complications (HCC)   Pulmonary emboli (HCC)   Anxiety and depression   HTN (hypertension)   Infective endocarditis   Sepsis (HCC)    Subjective: Still had episode of nausea and vomiting this morning. No fever  Medications:   (feeding supplement) PROSource Plus  30 mL Oral BID BM   acetaminophen  650 mg Oral Q6H   buPROPion  300 mg Oral Daily   Chlorhexidine Gluconate Cloth  6 each Topical Daily   fluconazole  400 mg Oral QHS    HYDROmorphone (DILAUDID) injection  0.5 mg Intravenous Once   insulin aspart  0-20 Units Subcutaneous Q4H   insulin aspart  6 Units Subcutaneous TID WC   insulin glargine  60 Units Subcutaneous Daily   linezolid  600 mg Oral Q12H   lisinopril  2.5 mg Oral Daily   metoprolol tartrate  12.5 mg Oral BID   multivitamin with minerals  1 tablet Oral Daily   pantoprazole  40 mg Oral Daily   Ensure Max Protein  11 oz Oral Daily   sodium hypochlorite   Irrigation BID   traZODone  25 mg Oral QHS   venlafaxine XR  75 mg Oral Daily    Objective: Vital signs in last 24 hours: Temp:  [98.2 F (36.8 C)-99 F (37.2 C)] 98.2 F (36.8 C) (07/14 1317) Pulse Rate:  [67-88] 67 (07/14 1317) Resp:  [14-20] 14 (07/14 1317) BP: (115-135)/(64-72) 131/72 (07/14 1317) SpO2:  [92 %-95 %] 95 % (07/14 1317) Physical Exam  Constitutional:  oriented to person, place, and time. appears well-developed and well-nourished. No distress.  HENT: Karen Bennett/AT, PERRLA, no scleral icterus Mouth/Throat: Oropharynx is clear and moist. No oropharyngeal exudate.  Cardiovascular: Normal rate, regular rhythm and normal heart sounds. Exam reveals no gallop and no friction rub.  No murmur heard.  Pulmonary/Chest: Effort normal and breath sounds normal. No  respiratory distress.  has no wheezes.  Neck = supple, no nuchal rigidity Ext: right picc line still in place. Left leg less cellulitis Abdominal: Soft. Bowel sounds are normal.  exhibits no distension. There is no tenderness.  Lymphadenopathy: no cervical adenopathy. No axillary adenopathy Neurological: alert and oriented to person, place, and time.  Skin: Skin is warm and dry. No rash noted. No erythema.  Psychiatric: a normal mood and affect.  behavior is normal.   Lab Results Recent Labs    02/02/21 0116 02/03/21 0340  WBC 11.3* 10.9*  HGB 10.8* 10.6*  HCT 32.3* 32.6*  NA 126* 132*  K 3.0* 3.4*  CL 97* 102  CO2 23 24  BUN 14 16  CREATININE 1.13* 1.04*   Liver Panel Recent Labs    02/01/21 1233 02/02/21 0116 02/03/21 0340  PROT 7.5 6.2* 6.4*  ALBUMIN 2.2* 1.8* 1.8*  AST 9* 9* 8*  ALT 9 8 7   ALKPHOS 81 66 55  BILITOT 0.7 0.8 0.4  BILIDIR 0.1  --   --   IBILI 0.6  --   --    Sedimentation Rate No results for input(s): ESRSEDRATE in the last 72 hours. C-Reactive Protein No results for input(s): CRP in the last 72 hours.  Microbiology: reviewed Studies/Results: No results found.  Assessment/Plan: Acinetobacter and VRE bacteremia = recommend to continue with amp/sub. Will switch dapto to linezolid temporarily until PIV can be established. 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 Has been switched to iv fluconazole temporarily. Will change to orals until PIV and line holiday can be established  Klebsiella soft tissue infection of left leg = continue on amp/sub  [facility] Cell: [phone] Pager: [phone]  02/03/2021, 5:39 PM

## 2021-02-03 NOTE — Progress Notes (Signed)
IVT:  New PIV access obtained to LPFA g 22 with GBR.  R arm PICC removed with ease at 41 cm length. Slight redness noted at insertion site. Patient education/instruction given w/ understanding. No complaints of pain or discomfort from patient. RN [facility] informed.

## 2021-02-03 NOTE — TOC Progression Note (Signed)
Transition of Care [facility]) - Progression Note    Patient Details  Name: Karen Bennett MRN: [id number] Date of Birth: [birth year]  [facility]) CM/SW Contact  Marilene Vath, Olegario Messier, RN Phone Number: 02/03/2021, 3:30 PM  Clinical Narrative:  d/c plan home w/continued HHC-[facility] rep Marylene Land already following-HHRN-L thigh wound care M-W-F. Will need PTAR @ d/c.     Expected Discharge Plan: Home w Home Health Services Barriers to Discharge: Continued Medical Work up  Expected Discharge Plan and Services Expected Discharge Plan: Home w Home Health Services   Discharge Planning Services: CM Consult   Living arrangements for the past 2 months: Single Family Home                           HH Arranged: RN, PT, OT [facility] Agency: [facility] Date [facility] Agency Contacted: 02/03/21 Time HH Agency Contacted: 1529 Representative spoke with at [facility] Agency: Marylene Land   Social Determinants of Health (SDOH) Interventions    Readmission Risk Interventions Readmission Risk Prevention Plan 01/05/2021  Transportation Screening Complete  PCP or Specialist Appt within 5-7 Days Complete  Home Care Screening Complete  Some recent data might be hidden

## 2021-02-03 NOTE — Progress Notes (Signed)
ANTICOAGULATION CONSULT NOTE - Follow Up Consult  Pharmacy Consult for heparin Indication:  pulmonary embolus  Allergies  Allergen Reactions   Clindamycin Nausea And Vomiting   Zofran Itching and Nausea And Vomiting   Doxycycline Nausea And Vomiting   Morphine And Related Itching    Patient Measurements: Height: 5\' 6"  (167.6 cm) Weight: (!) 182.3 kg (401 lb 14.4 oz) (01/03/21) IBW/kg (Calculated) : 59.3 Heparin Dosing Weight: 108 kg  Vital Signs: Temp: 99 F (37.2 C) (07/14 0420) BP: 135/67 (07/14 0420) Pulse Rate: 88 (07/14 0420)  Labs: Recent Labs    02/01/21 1233 02/01/21 1255 02/01/21 1556 02/02/21 0116 02/02/21 0852 02/02/21 1150 02/02/21 1800 02/03/21 0340  HGB 13.3  --   --  10.8*  --   --   --  10.6*  HCT 39.5  --   --  32.3*  --   --   --  32.6*  PLT 265  --   --  231  --   --   --  262  APTT 39*  --   --   --   --   --   --   --   LABPROT 13.2  --   --  14.0  --   --   --   --   INR 1.0  --   --  1.1  --   --   --   --   HEPARINUNFRC <0.10*  --   --  <0.10*   < > <0.10* 0.11* 0.18*  CREATININE 1.12*  --   --  1.13*  --   --   --  1.04*  CKTOTAL  --   --   --   --   --   --   --  22*  TROPONINIHS  --  11 14  --   --   --   --   --    < > = values in this interval not displayed.    Estimated Creatinine Clearance: 119.5 mL/min (A) (by C-G formula based on SCr of 1.04 mg/dL (H)).   Medications:  - on xarelto 20mg  daily PTA but was not taking it consistently d/t n/v  Assessment: Patient is a 45 y.o obese F with with complicated ID history (thigh infection,  VRE & acinetobacter bacteremia, Candidal native endocarditis) on IV abx PTA and PE on xarelto PTA, presented to the ED on 7/12 after she was found to be hyperglycemic at her PCP office.  Chest CT on 7/12 showed PE in the left upper lobe.   She's currently on heparin drip for VTE.  Today, 02/03/2021: - heparin level  collected at 12:45p is subtherapeutic at 0.22. Pt's RN stated that heparin bag ran out  this morning so the drip was off for an hour. MAR indicated that drip didn't resume back until 0933.  The 0.22 level was drawn about 3 hr early.   - cbc stable - no bleeding documented   Goal of Therapy:  Heparin level 0.3-0.7 units/ml Monitor platelets by anticoagulation protocol: Yes   Plan:  - continue heparin drip at 3450 units/hr for now. Check level at 4pm (~6 hrs after drip resumed back) - monitor for s/sx bleeding  Aleksander Edmiston P 02/03/2021,7:55 AM

## 2021-02-04 ENCOUNTER — Inpatient Hospital Stay (HOSPITAL_COMMUNITY): Payer: Medicare Other

## 2021-02-04 DIAGNOSIS — F32A Depression, unspecified: Secondary | ICD-10-CM | POA: Diagnosis not present

## 2021-02-04 DIAGNOSIS — I2699 Other pulmonary embolism without acute cor pulmonale: Secondary | ICD-10-CM | POA: Diagnosis not present

## 2021-02-04 DIAGNOSIS — F419 Anxiety disorder, unspecified: Secondary | ICD-10-CM | POA: Diagnosis not present

## 2021-02-04 LAB — GLUCOSE, CAPILLARY
Glucose-Capillary: 139 mg/dL — ABNORMAL HIGH (ref 70–99)
Glucose-Capillary: 158 mg/dL — ABNORMAL HIGH (ref 70–99)
Glucose-Capillary: 160 mg/dL — ABNORMAL HIGH (ref 70–99)
Glucose-Capillary: 178 mg/dL — ABNORMAL HIGH (ref 70–99)
Glucose-Capillary: 178 mg/dL — ABNORMAL HIGH (ref 70–99)
Glucose-Capillary: 203 mg/dL — ABNORMAL HIGH (ref 70–99)

## 2021-02-04 LAB — URINE CULTURE: Culture: >=100000 — AB

## 2021-02-04 LAB — CULTURE, BLOOD (ROUTINE X 2): Special Requests: ADEQUATE

## 2021-02-04 LAB — CBC
HCT: 30.7 % — ABNORMAL LOW (ref 36.0–46.0)
Hemoglobin: 10 g/dL — ABNORMAL LOW (ref 12.0–15.0)
MCH: 28.7 pg (ref 26.0–34.0)
MCHC: 32.6 g/dL (ref 30.0–36.0)
MCV: 88.2 fL (ref 80.0–100.0)
Platelets: 269 10*3/uL (ref 150–400)
RBC: 3.48 MIL/uL — ABNORMAL LOW (ref 3.87–5.11)
RDW: 14 % (ref 11.5–15.5)
WBC: 10.3 10*3/uL (ref 4.0–10.5)
nRBC: 0 % (ref 0.0–0.2)

## 2021-02-04 LAB — HEPARIN LEVEL (UNFRACTIONATED)
Heparin Unfractionated: 0.15 IU/mL — ABNORMAL LOW (ref 0.30–0.70)
Heparin Unfractionated: 0.28 IU/mL — ABNORMAL LOW (ref 0.30–0.70)

## 2021-02-04 MED ORDER — SODIUM CHLORIDE 0.9 % IV SOLN
1000.0000 mg | Freq: Three times a day (TID) | INTRAVENOUS | Status: DC
  Administered 2021-02-04 – 2021-02-09 (×15): 1000 mg via INTRAVENOUS
  Filled 2021-02-04 (×21): qty 1000

## 2021-02-04 MED ORDER — ENOXAPARIN SODIUM 150 MG/ML IJ SOSY
150.0000 mg | PREFILLED_SYRINGE | Freq: Two times a day (BID) | INTRAMUSCULAR | Status: DC
  Administered 2021-02-04 – 2021-02-09 (×11): 150 mg via SUBCUTANEOUS
  Filled 2021-02-04 (×12): qty 1

## 2021-02-04 MED ORDER — ENOXAPARIN SODIUM 300 MG/3ML IJ SOLN
160.0000 mg | Freq: Two times a day (BID) | INTRAMUSCULAR | Status: DC
  Filled 2021-02-04: qty 1.6

## 2021-02-04 MED ORDER — HEPARIN BOLUS VIA INFUSION
3000.0000 [IU] | Freq: Once | INTRAVENOUS | Status: AC
  Administered 2021-02-04: 3000 [IU] via INTRAVENOUS
  Filled 2021-02-04: qty 3000

## 2021-02-04 NOTE — Progress Notes (Signed)
ANTICOAGULATION CONSULT NOTE  Pharmacy Consult for heparin >> LMWH Indication: pulmonary embolus  Allergies  Allergen Reactions   Clindamycin Nausea And Vomiting   Zofran Itching and Nausea And Vomiting   Doxycycline Nausea And Vomiting   Morphine And Related Itching    Patient Measurements: Height: 5\' 6"  (167.6 cm) Weight: (!) 182.3 kg (401 lb 14.4 oz) (01/03/21) IBW/kg (Calculated) : 59.3 Heparin Dosing Weight: 108.5 kg  Vital Signs: Temp: 98.3 F (36.8 C) (07/15 0415) Temp Source: Oral (07/15 0415) BP: 111/58 (07/15 0415) Pulse Rate: 75 (07/15 0415)  Labs: Recent Labs    02/01/21 1233 02/01/21 1255 02/01/21 1556 02/02/21 0116 02/02/21 0852 02/03/21 0340 02/03/21 1245 02/03/21 1700 02/04/21 0005 02/04/21 0551  HGB 13.3  --   --  10.8*  --  10.6*  --   --   --  10.0*  HCT 39.5  --   --  32.3*  --  32.6*  --   --   --  30.7*  PLT 265  --   --  231  --  262  --   --   --  269  APTT 39*  --   --   --   --   --   --   --   --   --   LABPROT 13.2  --   --  14.0  --   --   --   --   --   --   INR 1.0  --   --  1.1  --   --   --   --   --   --   HEPARINUNFRC <0.10*  --   --  <0.10*   < > 0.18*   < > 0.23* 0.28* 0.15*  CREATININE 1.12*  --   --  1.13*  --  1.04*  --   --   --   --   CKTOTAL  --   --   --   --   --  22*  --   --   --   --   TROPONINIHS  --  11 14  --   --   --   --   --   --   --    < > = values in this interval not displayed.     Estimated Creatinine Clearance: 119.5 mL/min (A) (by C-G formula based on SCr of 1.04 mg/dL (H)).   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  Per EDP note "Patient states that she is not been taking her Xarelto for the past month due to her nausea vomiting at times".   Wt from 01/03/21 = 182.3  kg, Ht 66 inches. Baseline labs: aPTT 39 sec, INR 1.0, CBC WNL.   02/04/2021 HL 0.15 subtherapeutic on 3800 units/hr Hgb down to 10, plts WNL Per RN heparin no bleeding or  line interruptions SCr stable ~ 1 this admission   Goal of Therapy:  Anti-Xa 0.6-1 Monitor platelets by anticoagulation protocol: Yes   Plan:  Cannot get heparin to goal safely, plan to transition to Lovenox Will initiate Lovenox 150 mg q 12 hours and check anti-Xa level after the third dose  Stop heparin infusion once initial Lovenox dose administered  Luisa Hart D  02/04/21 9:06 AM

## 2021-02-04 NOTE — Progress Notes (Signed)
[facility]    Date of Admission:  02/01/2021   Total days of antibiotics 4          ID: Karen Bennett is a 45 y.o. female with  central line related bloodstream infection with vre and acinetobacter -- still getting treated for candidal endocarditis and leg wound (kleb/ and PsA) Active Problems:   DM (diabetes mellitus), type 2, uncontrolled with complications (HCC)   Pulmonary emboli (HCC)   Anxiety and depression   HTN (hypertension)   Infective endocarditis   Sepsis (HCC)    Subjective: Had picc line removed yesterday. Repeat blood cx last night. Today still having intermittent nausea and vomiting  Medications:   (feeding supplement) PROSource Plus  30 mL Oral BID BM   acetaminophen  650 mg Oral Q6H   buPROPion  300 mg Oral Daily   Chlorhexidine Gluconate Cloth  6 each Topical Daily   enoxaparin (LOVENOX) injection  150 mg Subcutaneous Q12H   fluconazole  400 mg Oral QHS    HYDROmorphone (DILAUDID) injection  0.5 mg Intravenous Once   insulin aspart  0-20 Units Subcutaneous Q4H   insulin aspart  6 Units Subcutaneous TID WC   insulin glargine  60 Units Subcutaneous Daily   lisinopril  2.5 mg Oral Daily   metoprolol tartrate  12.5 mg Oral BID   multivitamin with minerals  1 tablet Oral Daily   pantoprazole  40 mg Oral Daily   Ensure Max Protein  11 oz Oral Daily   traZODone  25 mg Oral QHS   venlafaxine XR  75 mg Oral Daily    Objective: Vital signs in last 24 hours: Temp:  [97.8 F (36.6 C)-98.4 F (36.9 C)] 97.8 F (36.6 C) (07/15 1344) Pulse Rate:  [63-78] 63 (07/15 1344) Resp:  [16-20] 20 (07/15 1344) BP: (111-141)/(58-76) 117/68 (07/15 1344) SpO2:  [93 %-96 %] 96 % (07/15 1344) Physical Exam  Constitutional:  oriented to person, place, and time. appears well-developed and well-nourished. No distress.  HENT: [place]/AT, PERRLA, no scleral icterus Mouth/Throat: Oropharynx is clear and moist. No oropharyngeal exudate.  Cardiovascular:  Normal rate, regular rhythm and normal heart sounds. Exam reveals no gallop and no friction rub.  No murmur heard.  Pulmonary/Chest: Effort normal and breath sounds normal. No respiratory distress.  has no wheezes.  Neck = supple, no nuchal rigidity Abdominal: Soft. Bowel sounds are normal.  exhibits no distension. There is no tenderness.  CBU:LAGT leg slightly less erythema to lateral thigh wound Lymphadenopathy: no cervical adenopathy. No axillary adenopathy Neurological: alert and oriented to person, place, and time.  Skin: Skin is warm and dry. No rash noted. No erythema.  Psychiatric: a normal mood and affect.  behavior is normal.    Lab Results Recent Labs    02/02/21 0116 02/03/21 0340 02/04/21 0551  WBC 11.3* 10.9* 10.3  HGB 10.8* 10.6* 10.0*  HCT 32.3* 32.6* 30.7*  NA 126* 132*  --   K 3.0* 3.4*  --   CL 97* 102  --   CO2 23 24  --   BUN 14 16  --   CREATININE 1.13* 1.04*  --    Liver Panel Recent Labs    02/02/21 0116 02/03/21 0340  PROT 6.2* 6.4*  ALBUMIN 1.8* 1.8*  AST 9* 8*  ALT 8 7  ALKPHOS 66 55  BILITOT 0.8 0.4     Microbiology: 7/14 blood cx NGTD Studies/Results: No results found.   Assessment/Plan: Candidal endocarditis =  continue on fluconazole, currently oral, due to limited IV access. She was previously on caspofungin but did not tolerate  Nausea and vomiting = I wonder if she has element of gastroparesis and may do better for other agent than phenergan  Acinetobacter and e.faecalis (amp S) bacteremia = thought to be due to infected PICC line. Can treat with imipenem to also cover leg wound  Repeat blood cx are drawn on 7/14. If negative at 48hrs, can then get picc line  Pseudomonas and klebseilla deep tissue infection = recommend 14 day of treatment. Started on imipenem due to recent identification of pseudomonas  Dr Zenaida Niece dam available for questions. Dr comer to see on monday  [facility] Cell: [phone] Pager: [phone]  02/04/2021, 4:38 PM

## 2021-02-04 NOTE — Progress Notes (Signed)
PROGRESS NOTE    Karen Bennett  SNK:[id number] DOB: 1975/09/17 DOA: 02/01/2021 PCP: Pablo Lawrence, NP   Brief Narrative: 45 y.o. female with medical history significant of type 2 diabetes mellitus, prior history of DVT/PE, right BKA, super morbid obesity, fungemia, endocarditis who was hospitalized from 12/26/2020 through 01/05/2021 for left lateral thigh abscess and during this hospitalization she was also diagnosed with Candida albicans fungemia/mitral valve endocarditis and she was discharged home on IV micafungin for 6 weeks.  She completed 9 days of IV antibiotics for left thigh abscess which grew Pseudomonas.  She comes to the ER today for advice from the home health nurse which found out that her blood sugar was more than 500.  Although patient herself did not have any complaints.  However having said that, she tells me that she has been having intermittent nausea and vomiting at home since she has been started on antifungals at home.  Initially she claimed that she has not been able to eat or drink anything at home.  When I brought up the fact that she does not look dehydrated for a person who has not had anything to eat for a month.  She felt offended and changed her statement that she has intermittent symptoms and not all the time.  Her friend/neighbor is at the bedside who has been talking for the patient for most part and unfortunately continues to interrupt me from interviewing the patient.  For some reason, they seem to be hostile against medical community and do not seem to be happy with the care they have received so far.  Regarding her, she was not having any chills, sweating or fever at home.   ED Course: Upon arrival to the ED, her temperature was 100.5, she was tachycardic and tachypneic.  Slightly elevated blood pressure as well.  Mild leukocytosis 10.9.  Yet once again hyponatremia with sodium of 126.  CKD stage IIIa at baseline.  Blood sugar 517 but anion gap normal.  No DKA.   Lactic acid normal.  She is not vaccinated for COVID, however COVID test is negative here.  She was already seen by theGeneral surgery in the ED and they evacuated small pocket of purulent drainage from the left lateral thigh.  Cultures were sent.  They recommended dressing changes with Dakin's x72 hours.  Recommended CT of the left thigh which ruled out abscess.  ED physician also discussed the case with Dr. Graylon Good of ID and per her recommendations, patient was started on cefepime and Rocephin.  Hospital service was then consulted to admit the patient for further management.  CT angiogram was also done which once again showed left-sided PE which is chronic.  Patient was started on heparin drip as well.    Assessment & Plan:   Active Problems:   DM (diabetes mellitus), type 2, uncontrolled with complications (HCC)   Pulmonary emboli (HCC)   Anxiety and depression   HTN (hypertension)   Infective endocarditis   Sepsis ([place])  #1 sepsis present on admission secondary to left thigh abscess/VRE bacteremia/fungemia.  She met criteria for sepsis at the time of admission with tachypnea tachycardia fever and leukocytosis.  She is status post incision and drainage of the left thigh abscess with Dakin's solution dressings to be done for daily for 3 days. Klebsiella from left leg culture, VRE and Acinetobacter from blood culture  Followed by ID and general surgery. PICC line dcd 7/14 On Unasyn, zyvox , fluconazole.  #2 Candida mitral valve endocarditis currently  on Diflucan.  #3 uncontrolled type 2 diabetes with hyperglycemia blood sugar remains elevated increase Lantus to 60 units and increase NovoLog to 6 units 3 times a day before meals. CBG (last 3)  Recent Labs    02/04/21 0412 02/04/21 0745 02/04/21 1144  GLUCAP 160* 158* 139*    #4 CKD stage IIIa at baseline  #5 history of essential hypertension on lisinopril and Lopressor.  #6 anxiety and depression on Wellbutrin and Effexor  #7  hyperlipidemia continue statin  #8 hypokalemia repleted  #9 chronic pulmonary embolism on heparin restart Xarelto when stable  #10 pseudo hyponatremia due to hyperglycemia resolved.follow up labs in am   Pressure Injury 12/27/20 Buttocks Right;Left Stage 2 -  Partial thickness loss of dermis presenting as a shallow open injury with a red, pink wound bed without slough. (Active)  12/27/20 1200  Location: Buttocks  Location Orientation: Right;Left  Staging: Stage 2 -  Partial thickness loss of dermis presenting as a shallow open injury with a red, pink wound bed without slough.  Wound Description (Comments):   Present on Admission:      Estimated body mass index is 64.87 kg/m as calculated from the following:   Height as of this encounter: _0  (1.676 m).   Weight as of this encounter: 182.3 kg.  DVT prophylaxis: IV heparin Code Status: Full code Family Communication: None at bedside Disposition Plan: Home  Remains inpatient appropriate because:IV treatments appropriate due to intensity of illness or inability to take PO  Dispo: The patient is from: Home              Anticipated d/c is to: Home              Patient currently is not medically stable to d/c.   Difficult to place patient No    Consultants: General surgery and infectious disease  Procedures: Incision and drainage of the left thigh abscess 02/02/2021 Antimicrobials: Anti-infectives (From admission, onward)    Start     Dose/Rate Route Frequency Ordered Stop   02/03/21 2200  fluconazole (DIFLUCAN) tablet 400 mg        400 mg Oral Daily at bedtime 02/03/21 1528     02/03/21 2200  linezolid (ZYVOX) tablet 600 mg        600 mg Oral Every 12 hours 02/03/21 1528     02/02/21 2000  DAPTOmycin (CUBICIN) 1,200 mg in sodium chloride 0.9 % IVPB  Status:  Discontinued        1,200 mg 148 mL/hr over 30 Minutes Intravenous Daily 02/02/21 0805 02/03/21 1528   02/02/21 0530  DAPTOmycin (CUBICIN) 850 mg in sodium chloride  0.9 % IVPB        8 mg/kg  108.5 kg (Adjusted) 134 mL/hr over 30 Minutes Intravenous  Once 02/02/21 0431 02/02/21 0611   02/02/21 0530  Ampicillin-Sulbactam (UNASYN) 3 g in sodium chloride 0.9 % 100 mL IVPB        3 g 200 mL/hr over 30 Minutes Intravenous Every 6 hours 02/02/21 0431     02/02/21 0500  Vancomycin (VANCOCIN) 1,250 mg in sodium chloride 0.9 % 250 mL IVPB  Status:  Discontinued        1,250 mg 166.7 mL/hr over 90 Minutes Intravenous Every 12 hours 02/01/21 1647 02/02/21 0431   02/01/21 2200  ceFEPIme (MAXIPIME) 2 g in sodium chloride 0.9 % 100 mL IVPB  Status:  Discontinued        2 g 200 mL/hr over 30 Minutes  Intravenous Every 8 hours 02/01/21 1639 02/02/21 0431   02/01/21 2200  fluconazole (DIFLUCAN) IVPB 400 mg  Status:  Discontinued        400 mg 100 mL/hr over 120 Minutes Intravenous Every 24 hours 02/01/21 1732 02/03/21 1528   02/01/21 1800  anidulafungin (ERAXIS) 200 mg in sodium chloride 0.9 % 200 mL IVPB  Status:  Discontinued        200 mg 78 mL/hr over 200 Minutes Intravenous Every 24 hours 02/01/21 1639 02/01/21 1731   02/01/21 1700  ceFEPIme (MAXIPIME) 2 g in sodium chloride 0.9 % 100 mL IVPB  Status:  Discontinued        2 g 200 mL/hr over 30 Minutes Intravenous  Once 02/01/21 1654 02/01/21 1658   02/01/21 1700  vancomycin (VANCOCIN) IVPB 1000 mg/200 mL premix  Status:  Discontinued        1,000 mg 200 mL/hr over 60 Minutes Intravenous  Once 02/01/21 1654 02/01/21 1659   02/01/21 1400  ceFEPIme (MAXIPIME) 2 g in sodium chloride 0.9 % 100 mL IVPB        2 g 200 mL/hr over 30 Minutes Intravenous  Once 02/01/21 1347 02/01/21 1605   02/01/21 1400  vancomycin (VANCOCIN) 2,500 mg in sodium chloride 0.9 % 500 mL IVPB        2,500 mg 250 mL/hr over 120 Minutes Intravenous  Once 02/01/21 1348 02/01/21 1805        Subjective: she feels better  Zyvox started daptomycin stopped due to picc dc   Objective: Vitals:   02/03/21 1317 02/03/21 2058 02/04/21 0415  02/04/21 1001  BP: 131/72 (!) 141/72 (!) 111/58 138/76  Pulse: 67 78 75 77  Resp: _0 Temp: 98.2 F (36.8 C) 98.1 F (36.7 C) 98.3 F (36.8 C) 98.4 F (36.9 C)  TempSrc: Oral Oral Oral Oral  SpO2: 95% 96% 93% 93%  Weight:      Height:        Intake/Output Summary (Last 24 hours) at 02/04/2021 1219 Last data filed at 02/04/2021 3343 Gross per 24 hour  Intake 3822.97 ml  Output 1150 ml  Net 2672.97 ml    Filed Weights   02/01/21 1409  Weight: (!) 182.3 kg    Examination:  General exam: Appears in distress due to pain Respiratory system: Clear to auscultation. Respiratory effort normal. Cardiovascular system: S1 & S2 heard, RRR. No JVD, murmurs, rubs, gallops or clicks. No pedal edema. Gastrointestinal system: Abdomen is nondistended, soft and nontender. No organomegaly or masses felt. Normal bowel sounds heard. Central nervous system: Alert and oriented. No focal neurological deficits. Extremities: Right BKA, left lower extremity with 1+ edema skin: No rashes, lesions or ulcers Psychiatry: Judgement and insight appear normal. Mood & affect appropriate.     Data Reviewed: I have personally reviewed following labs and imaging studies  CBC: Recent Labs  Lab 02/01/21 1233 02/02/21 0116 02/03/21 0340 02/04/21 0551  WBC 10.9* 11.3* 10.9* 10.3  HGB 13.3 10.8* 10.6* 10.0*  HCT 39.5 32.3* 32.6* 30.7*  MCV 84.9 85.2 86.7 88.2  PLT 265 231 262 568    Basic Metabolic Panel: Recent Labs  Lab 02/01/21 1233 02/01/21 1650 02/02/21 0116 02/03/21 0340  NA 126*  --  126* 132*  K 3.5  --  3.0* 3.4*  CL 95*  --  97* 102  CO2 21*  --  23 24  GLUCOSE 517* 524* 468* 227*  BUN 12  --  14 16  CREATININE 1.12*  --  1.13* 1.04*  CALCIUM 8.7*  --  7.9* 8.4*  MG  --  1.9  --   --     GFR: Estimated Creatinine Clearance: 119.5 mL/min (A) (by C-G formula based on SCr of 1.04 mg/dL (H)). Liver Function Tests: Recent Labs  Lab 02/01/21 1233 02/02/21 0116  02/03/21 0340  AST 9* 9* 8*  ALT _0 ALKPHOS 81 66 55  BILITOT 0.7 0.8 0.4  PROT 7.5 6.2* 6.4*  ALBUMIN 2.2* 1.8* 1.8*    No results for input(s): LIPASE, AMYLASE in the last 168 hours. No results for input(s): AMMONIA in the last 168 hours. Coagulation Profile: Recent Labs  Lab 02/01/21 1233 02/02/21 0116  INR 1.0 1.1    Cardiac Enzymes: Recent Labs  Lab 02/03/21 0340  CKTOTAL 22*    BNP (last 3 results) No results for input(s): PROBNP in the last 8760 hours. HbA1C: No results for input(s): HGBA1C in the last 72 hours. CBG: Recent Labs  Lab 02/03/21 2055 02/04/21 0034 02/04/21 0412 02/04/21 0745 02/04/21 1144  GLUCAP 214* 203* 160* 158* 139*    Lipid Profile: No results for input(s): CHOL, HDL, LDLCALC, TRIG, CHOLHDL, LDLDIRECT in the last 72 hours. Thyroid Function Tests: No results for input(s): TSH, T4TOTAL, FREET4, T3FREE, THYROIDAB in the last 72 hours. Anemia Panel: No results for input(s): VITAMINB12, FOLATE, FERRITIN, TIBC, IRON, RETICCTPCT in the last 72 hours. Sepsis Labs: Recent Labs  Lab 02/01/21 1301 02/01/21 1501 02/02/21 0116  PROCALCITON  --   --  3.75  LATICACIDVEN 1.2 1.3  --      Recent Results (from the past 240 hour(s))  Urine culture     Status: Abnormal   Collection Time: 02/01/21 12:33 PM   Specimen: In/Out Cath Urine  Result Value Ref Range Status   Specimen Description   Final    IN/OUT CATH URINE Performed at [facility], [place] [address] [place], [place] [address]    Special Requests   Final    NONE Performed at [facility], [place] [address] [place], [place] [address]    Culture >=100,000 COLONIES/mL KLEBSIELLA PNEUMONIAE (A)  Final   Report Status 02/04/2021 FINAL  Final   Organism ID, Bacteria KLEBSIELLA PNEUMONIAE (A)  Final      Susceptibility   Klebsiella pneumoniae - MIC*    AMPICILLIN >=32 RESISTANT Resistant     CEFAZOLIN <=4 SENSITIVE Sensitive     CEFEPIME  <=0.12 SENSITIVE Sensitive     CEFTRIAXONE <=0.25 SENSITIVE Sensitive     CIPROFLOXACIN <=0.25 SENSITIVE Sensitive     GENTAMICIN <=1 SENSITIVE Sensitive     IMIPENEM <=0.25 SENSITIVE Sensitive     NITROFURANTOIN 64 INTERMEDIATE Intermediate     TRIMETH/SULFA <=20 SENSITIVE Sensitive     AMPICILLIN/SULBACTAM 8 SENSITIVE Sensitive     PIP/TAZO <=4 SENSITIVE Sensitive     * >=100,000 COLONIES/mL KLEBSIELLA PNEUMONIAE  Resp Panel by RT-PCR (Flu A&B, Covid) Nasopharyngeal Swab     Status: None   Collection Time: 02/01/21  1:02 PM   Specimen: Nasopharyngeal Swab; Nasopharyngeal(NP) swabs in vial transport medium  Result Value Ref Range Status   SARS Coronavirus 2 by RT PCR NEGATIVE NEGATIVE Final    Comment: (NOTE) SARS-CoV-2 target nucleic acids are NOT DETECTED.  The SARS-CoV-2 RNA is generally detectable in upper respiratory specimens during the acute phase of infection. The lowest concentration of SARS-CoV-2 viral copies this assay can detect is 138 copies/mL. A negative  result does not preclude SARS-Cov-2 infection and should not be used as the sole basis for treatment or other patient management decisions. A negative result may occur with  improper specimen collection/handling, submission of specimen other than nasopharyngeal swab, presence of viral mutation(s) within the areas targeted by this assay, and inadequate number of viral copies(<138 copies/mL). A negative result must be combined with clinical observations, patient history, and epidemiological information. The expected result is Negative.  Fact Sheet for Patients:  EntrepreneurPulse.com.au  Fact Sheet for Healthcare Providers:  IncredibleEmployment.be  This test is no t yet approved or cleared by the [place] FDA and  has been authorized for detection and/or diagnosis of SARS-CoV-2 by FDA under an Emergency Use Authorization (EUA). This EUA will remain  in effect (meaning this  test can be used) for the duration of the COVID-19 declaration under Section 564(b)(1) of the Act, 21 U.S.C.section 360bbb-3(b)(1), unless the authorization is terminated  or revoked sooner.       Influenza A by PCR NEGATIVE NEGATIVE Final   Influenza B by PCR NEGATIVE NEGATIVE Final    Comment: (NOTE) The Xpert Xpress SARS-CoV-2/FLU/RSV plus assay is intended as an aid in the diagnosis of influenza from Nasopharyngeal swab specimens and should not be used as a sole basis for treatment. Nasal washings and aspirates are unacceptable for Xpert Xpress SARS-CoV-2/FLU/RSV testing.  Fact Sheet for Patients: EntrepreneurPulse.com.au  Fact Sheet for Healthcare Providers: IncredibleEmployment.be  This test is not yet approved or cleared by the [place] FDA and has been authorized for detection and/or diagnosis of SARS-CoV-2 by FDA under an Emergency Use Authorization (EUA). This EUA will remain in effect (meaning this test can be used) for the duration of the COVID-19 declaration under Section 564(b)(1) of the Act, 21 U.S.C. section 360bbb-3(b)(1), unless the authorization is terminated or revoked.  Performed at [facility], [place] [address] [place], [place] [address]   Blood culture (routine x 2)     Status: Abnormal   Collection Time: 02/01/21  1:40 PM   Specimen: BLOOD  Result Value Ref Range Status   Specimen Description   Final    BLOOD RIGHT ANTECUBITAL Performed at [facility] [address] [place], [place] [address]    Special Requests   Final    BOTTLES DRAWN AEROBIC AND ANAEROBIC Blood Culture results may not be optimal due to an inadequate volume of blood received in culture bottles Performed at [facility] [address], [place], [place] [address]    Culture  Setup Time   Final    GRAM POSITIVE COCCI IN PAIRS IN BOTH AEROBIC AND ANAEROBIC BOTTLES GRAM NEGATIVE  RODS AEROBIC BOTTLE ONLY CRITICAL VALUE NOTED.  VALUE IS CONSISTENT WITH PREVIOUSLY REPORTED AND CALLED VALUE.    Culture (A)  Final    ACINETOBACTER CALCOACETICUS/BAUMANNII COMPLEX ENTEROCOCCUS FAECALIS SUSCEPTIBILITIES PERFORMED ON PREVIOUS CULTURE WITHIN THE LAST 5 DAYS. Performed at [facility], [place] [address] [place], [place] [address]    Report Status 02/04/2021 FINAL  Final  Blood culture (routine x 2)     Status: Abnormal   Collection Time: 02/01/21  1:48 PM   Specimen: BLOOD RIGHT FOREARM  Result Value Ref Range Status   Specimen Description   Final    BLOOD RIGHT FOREARM Performed at [facility] [address], [place], [place] [address]    Special Requests   Final    BOTTLES DRAWN AEROBIC AND ANAEROBIC Blood Culture adequate  volume Performed at [facility], [place] [address] [place], [place] [address]    Culture  Setup Time   Final    GRAM POSITIVE COCCI IN PAIRS IN BOTH AEROBIC AND ANAEROBIC BOTTLES GRAM NEGATIVE RODS AEROBIC BOTTLE ONLY CRITICAL RESULT CALLED TO, READ BACK BY AND VERIFIED WITH: Gustavo Lah [id number] 02/02/2021 Mena Goes Performed at [facility], [place] [address], [place], [place] [address]    Culture (A)  Final    ACINETOBACTER CALCOACETICUS/BAUMANNII COMPLEX VANCOMYCIN RESISTANT ENTEROCOCCUS    Report Status 02/04/2021 FINAL  Final   Organism ID, Bacteria ACINETOBACTER CALCOACETICUS/BAUMANNII COMPLEX  Final   Organism ID, Bacteria VANCOMYCIN RESISTANT ENTEROCOCCUS  Final      Susceptibility   Acinetobacter calcoaceticus/baumannii complex - MIC*    CEFTAZIDIME 16 INTERMEDIATE Intermediate     CIPROFLOXACIN 1 SENSITIVE Sensitive     GENTAMICIN <=1 SENSITIVE Sensitive     IMIPENEM <=0.25 SENSITIVE Sensitive     PIP/TAZO 32 INTERMEDIATE Intermediate     TRIMETH/SULFA <=20 SENSITIVE Sensitive     AMPICILLIN/SULBACTAM <=2 SENSITIVE Sensitive     * ACINETOBACTER CALCOACETICUS/BAUMANNII COMPLEX    Vancomycin resistant enterococcus - MIC*    AMPICILLIN <=2 SENSITIVE Sensitive     VANCOMYCIN >=32 RESISTANT Resistant     GENTAMICIN SYNERGY SENSITIVE Sensitive     LINEZOLID 2 SENSITIVE Sensitive     * VANCOMYCIN RESISTANT ENTEROCOCCUS  Blood Culture ID Panel (Reflexed)     Status: Abnormal   Collection Time: 02/01/21  1:48 PM  Result Value Ref Range Status   Enterococcus faecalis DETECTED (A) NOT DETECTED Final    Comment: CRITICAL RESULT CALLED TO, READ BACK BY AND VERIFIED WITH: EMariah Milling 0411 02/02/2021 T. TYSOR    Enterococcus Faecium NOT DETECTED NOT DETECTED Final   Listeria monocytogenes NOT DETECTED NOT DETECTED Final   Staphylococcus species NOT DETECTED NOT DETECTED Final   Staphylococcus aureus (BCID) NOT DETECTED NOT DETECTED Final   Staphylococcus epidermidis NOT DETECTED NOT DETECTED Final   Staphylococcus lugdunensis NOT DETECTED NOT DETECTED Final   Streptococcus species NOT DETECTED NOT DETECTED Final   Streptococcus agalactiae NOT DETECTED NOT DETECTED Final   Streptococcus pneumoniae NOT DETECTED NOT DETECTED Final   Streptococcus pyogenes NOT DETECTED NOT DETECTED Final   A.calcoaceticus-baumannii DETECTED (A) NOT DETECTED Final    Comment: CRITICAL RESULT CALLED TO, READ BACK BY AND VERIFIED WITH: E. JACKSON,PHARMD 0411 02/02/2021 T. TYSOR    Bacteroides fragilis NOT DETECTED NOT DETECTED Final   Enterobacterales NOT DETECTED NOT DETECTED Final   Enterobacter cloacae complex NOT DETECTED NOT DETECTED Final   Escherichia coli NOT DETECTED NOT DETECTED Final   Klebsiella aerogenes NOT DETECTED NOT DETECTED Final   Klebsiella oxytoca NOT DETECTED NOT DETECTED Final   Klebsiella pneumoniae NOT DETECTED NOT DETECTED Final   Proteus species NOT DETECTED NOT DETECTED Final   Salmonella species NOT DETECTED NOT DETECTED Final   Serratia marcescens NOT DETECTED NOT DETECTED Final   Haemophilus influenzae NOT DETECTED NOT DETECTED Final   Neisseria  meningitidis NOT DETECTED NOT DETECTED Final   Pseudomonas aeruginosa NOT DETECTED NOT DETECTED Final   Stenotrophomonas maltophilia NOT DETECTED NOT DETECTED Final   Candida albicans NOT DETECTED NOT DETECTED Final   Candida auris NOT DETECTED NOT DETECTED Final   Candida glabrata NOT DETECTED NOT DETECTED Final   Candida krusei NOT DETECTED NOT DETECTED Final   Candida parapsilosis NOT DETECTED NOT DETECTED Final   Candida tropicalis NOT DETECTED NOT DETECTED Final  Cryptococcus neoformans/gattii NOT DETECTED NOT DETECTED Final   CTX-M ESBL NOT DETECTED NOT DETECTED Final   Carbapenem resistance IMP NOT DETECTED NOT DETECTED Final   Carbapenem resistance KPC NOT DETECTED NOT DETECTED Final   Carbapenem resistance NDM NOT DETECTED NOT DETECTED Final   Vancomycin resistance DETECTED (A) NOT DETECTED Final    Comment: CRITICAL RESULT CALLED TO, READ BACK BY AND VERIFIED WITH: EMariah Milling [id number] 02/02/2021 T. TYSOR    Carbapenem resistance VIM NOT DETECTED NOT DETECTED Final    Comment: Performed at [facility], [place] [address], [place], [place] [address]  Aerobic/Anaerobic Culture w Gram Stain (surgical/deep wound)     Status: None (Preliminary result)   Collection Time: 02/01/21  1:49 PM   Specimen: Abscess  Result Value Ref Range Status   Specimen Description   Final    ABSCESS LEFT LEG Performed at [facility] [address], [place], [place] [address]    Special Requests   Final    Immunocompromised Performed at [facility], [place] [address] [place], [place] [address]    Gram Stain   Final    FEW WBC PRESENT,BOTH PMN AND MONONUCLEAR FEW GRAM NEGATIVE RODS Performed at [facility], [place] [address], [place], [place] [address]    Culture   Final    MODERATE KLEBSIELLA PNEUMONIAE RARE PSEUDOMONAS AERUGINOSA SUSCEPTIBILITIES TO FOLLOW NO ANAEROBES ISOLATED; CULTURE IN PROGRESS FOR 5 DAYS    Report Status PENDING   Incomplete   Organism ID, Bacteria KLEBSIELLA PNEUMONIAE  Final      Susceptibility   Klebsiella pneumoniae - MIC*    AMPICILLIN >=32 RESISTANT Resistant     CEFAZOLIN <=4 SENSITIVE Sensitive     CEFEPIME <=0.12 SENSITIVE Sensitive     CEFTAZIDIME <=1 SENSITIVE Sensitive     CEFTRIAXONE <=0.25 SENSITIVE Sensitive     CIPROFLOXACIN <=0.25 SENSITIVE Sensitive     GENTAMICIN <=1 SENSITIVE Sensitive     IMIPENEM <=0.25 SENSITIVE Sensitive     TRIMETH/SULFA <=20 SENSITIVE Sensitive     AMPICILLIN/SULBACTAM 4 SENSITIVE Sensitive     PIP/TAZO <=4 SENSITIVE Sensitive     * MODERATE KLEBSIELLA PNEUMONIAE  Culture, blood (Routine X 2) w Reflex to ID Panel     Status: None (Preliminary result)   Collection Time: 02/03/21  8:43 PM   Specimen: BLOOD LEFT HAND  Result Value Ref Range Status   Specimen Description   Final    BLOOD LEFT HAND Blood Culture adequate volume BOTTLES DRAWN AEROBIC ONLY Performed at [facility] [address] [place], [place] [address]    Special Requests   Final    NONE Performed at [facility], [place] [address], [place], [place] [address]    Culture   Final    NO GROWTH < 12 HOURS Performed at [facility] [address], [place], [place] [address]    Report Status PENDING  Incomplete  Culture, blood (Routine X 2) w Reflex to ID Panel     Status: None (Preliminary result)   Collection Time: 02/03/21  8:43 PM   Specimen: BLOOD RIGHT ARM  Result Value Ref Range Status   Specimen Description   Final    BLOOD RIGHT ARM Blood Culture adequate volume BOTTLES DRAWN AEROBIC ONLY Performed at [facility] [address] [place], [place] [address]    Special Requests   Final    NONE Performed at [facility],  [place] [address], [place], [place] [address]    Culture   Final    NO GROWTH < 12 HOURS Performed at [facility] [address], [place], [place] [address]    Report  Status PENDING  Incomplete          Radiology Studies: No results found.      Scheduled Meds:  (feeding supplement) PROSource Plus  30 mL Oral BID BM   acetaminophen  650 mg Oral Q6H   buPROPion  300 mg Oral Daily   Chlorhexidine Gluconate Cloth  6 each Topical Daily   enoxaparin (LOVENOX) injection  150 mg Subcutaneous Q12H   fluconazole  400 mg Oral QHS    HYDROmorphone (DILAUDID) injection  0.5 mg Intravenous Once   insulin aspart  0-20 Units Subcutaneous Q4H   insulin aspart  6 Units Subcutaneous TID WC   insulin glargine  60 Units Subcutaneous Daily   linezolid  600 mg Oral Q12H   lisinopril  2.5 mg Oral Daily   metoprolol tartrate  12.5 mg Oral BID   multivitamin with minerals  1 tablet Oral Daily   pantoprazole  40 mg Oral Daily   Ensure Max Protein  11 oz Oral Daily   traZODone  25 mg Oral QHS   venlafaxine XR  75 mg Oral Daily   Continuous Infusions:  sodium chloride 75 mL/hr at 02/04/21 0657   ampicillin-sulbactam (UNASYN) IV Stopped (02/04/21 0511)   promethazine (PHENERGAN) injection (IM or IVPB) Stopped (02/03/21 1511)     LOS: 3 days    Time spent: 40 minutes    Georgette Shell, MD 02/04/2021, 12:19 PM

## 2021-02-04 NOTE — Progress Notes (Signed)
ANTICOAGULATION CONSULT NOTE - follow up  Pharmacy Consult for heparin Indication: pulmonary embolus  Allergies  Allergen Reactions   Clindamycin Nausea And Vomiting   Zofran Itching and Nausea And Vomiting   Doxycycline Nausea And Vomiting   Morphine And Related Itching    Patient Measurements: Height: 5\' 6"  (167.6 cm) Weight: (!) 182.3 kg (401 lb 14.4 oz) (01/03/21) IBW/kg (Calculated) : 59.3 Heparin Dosing Weight: 108.5 kg  Vital Signs: Temp: 98.1 F (36.7 C) (07/14 2058) Temp Source: Oral (07/14 2058) BP: 141/72 (07/14 2058) Pulse Rate: 78 (07/14 2058)  Labs: Recent Labs    02/01/21 1233 02/01/21 1255 02/01/21 1556 02/02/21 0116 02/02/21 0852 02/03/21 0340 02/03/21 1245 02/03/21 1700 02/04/21 0005  HGB 13.3  --   --  10.8*  --  10.6*  --   --   --   HCT 39.5  --   --  32.3*  --  32.6*  --   --   --   PLT 265  --   --  231  --  262  --   --   --   APTT 39*  --   --   --   --   --   --   --   --   LABPROT 13.2  --   --  14.0  --   --   --   --   --   INR 1.0  --   --  1.1  --   --   --   --   --   HEPARINUNFRC <0.10*  --   --  <0.10*   < > 0.18* 0.22* 0.23* 0.28*  CREATININE 1.12*  --   --  1.13*  --  1.04*  --   --   --   CKTOTAL  --   --   --   --   --  22*  --   --   --   TROPONINIHS  --  11 14  --   --   --   --   --   --    < > = values in this interval not displayed.     Estimated Creatinine Clearance: 119.5 mL/min (A) (by C-G formula based on SCr of 1.04 mg/dL (H)).   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  Per EDP note "Patient states that she is not been taking her Xarelto for the past month due to her nausea vomiting at times".   Wt from 01/03/21 = 182.3 kg, Ht 66 inches. Baseline labs: aPTT 39 sec, INR 1.0, CBC  WNL.   02/04/2021 HL 0.28 subtherapeutic on 3800 units/hr Per RN heparin was off for ~ 20-30 minutes 20 minutes before level was drawn no bleeding    Goal of Therapy:  Heparin level 0.3-0.7 units/ml Monitor platelets by anticoagulation protocol: Yes   Plan:  Due to heparin being off before level drawn a true level is unknown, will continue drip at 3800 units/hr Heparin level in 6 hours Daily CBC  02/06/2021 RPh 02/04/2021, 1:00 AM

## 2021-02-04 NOTE — Progress Notes (Addendum)
ANTICOAGULATION CONSULT NOTE - follow up  Pharmacy Consult for heparin Indication: pulmonary embolus  Allergies  Allergen Reactions   Clindamycin Nausea And Vomiting   Zofran Itching and Nausea And Vomiting   Doxycycline Nausea And Vomiting   Morphine And Related Itching    Patient Measurements: Height: 5\' 6"  (167.6 cm) Weight: (!) 182.3 kg (401 lb 14.4 oz) (01/03/21) IBW/kg (Calculated) : 59.3 Heparin Dosing Weight: 108.5 kg  Vital Signs: Temp: 98.3 F (36.8 C) (07/15 0415) Temp Source: Oral (07/15 0415) BP: 111/58 (07/15 0415) Pulse Rate: 75 (07/15 0415)  Labs: Recent Labs    02/01/21 1233 02/01/21 1255 02/01/21 1556 02/02/21 0116 02/02/21 0852 02/03/21 0340 02/03/21 1245 02/03/21 1700 02/04/21 0005 02/04/21 0551  HGB 13.3  --   --  10.8*  --  10.6*  --   --   --  10.0*  HCT 39.5  --   --  32.3*  --  32.6*  --   --   --  30.7*  PLT 265  --   --  231  --  262  --   --   --  269  APTT 39*  --   --   --   --   --   --   --   --   --   LABPROT 13.2  --   --  14.0  --   --   --   --   --   --   INR 1.0  --   --  1.1  --   --   --   --   --   --   HEPARINUNFRC <0.10*  --   --  <0.10*   < > 0.18*   < > 0.23* 0.28* 0.15*  CREATININE 1.12*  --   --  1.13*  --  1.04*  --   --   --   --   CKTOTAL  --   --   --   --   --  22*  --   --   --   --   TROPONINIHS  --  11 14  --   --   --   --   --   --   --    < > = values in this interval not displayed.     Estimated Creatinine Clearance: 119.5 mL/min (A) (by C-G formula based on SCr of 1.04 mg/dL (H)).   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  Per EDP note "Patient states that she is not been taking her Xarelto for the past month due to her nausea vomiting at times".   Wt from 01/03/21 =  182.3 kg, Ht 66 inches. Baseline labs: aPTT 39 sec, INR 1.0, CBC WNL.   02/04/2021 HL 0.15 subtherapeutic on 3800 units/hr Hgb down to 10, plts WNL Per RN heparin no bleeding or  line interruptions   Goal of Therapy:  Heparin level 0.3-0.7 units/ml Monitor platelets by anticoagulation protocol: Yes   Plan:  Bolus heparin 3000 units Increase heparin to 4100 units/hr Heparin level in 6 hours Daily CBC  02/06/2021 RPh 02/04/2021, 6:22 AM  RN informs pharmacy that pump will not allow to run at 4100, upon further investigation  there is a hard max of 3999 units/hr.  Bolus still given, increase heparin to 3900 units/hr Discuss with MD changing to another agent  Arley Phenix RPh 02/04/2021, 6:46 AM

## 2021-02-04 NOTE — Progress Notes (Addendum)
CC: Left thigh wound  Subjective: Doing well this afternoon. Some soreness at left thigh wound  Objective: Vital signs in last 24 hours: Temp:  [97.8 F (36.6 C)-98.4 F (36.9 C)] 97.8 F (36.6 C) (07/15 1344) Pulse Rate:  [63-78] 63 (07/15 1344) Resp:  [16-20] 20 (07/15 1344) BP: (111-141)/(58-76) 117/68 (07/15 1344) SpO2:  [93 %-96 %] 96 % (07/15 1344) Last BM Date: 01/31/21 720 p.o. 687 IV 1850 urine T-max 100.5; afebrile since 1900 last evening; vital signs stable Glucose 517>> 269 Potassium 3.0 Sodium 126 Creatinine 1.13 WBC 10.9>> 11.3 Blood urine and wound cultures pending CT left femur: Ill-defined areas of fluid and gas within the anterior aspect the distal thigh that has been surgically debrided.  There is a tubular shaped air cavity within the anterior lateral superficial soft tissue of the distal thigh at the surgical debridement site reflecting prior surgical incision.  No associated fluid collections to suggest abscess at this location no additional sites of air or gas within the soft tissue.  Prominent thickening with marked subcutaneous edema most pronounced at the medial aspect distal thigh similar findings noted contralateral side partially occluded within the field-of-view reflecting chronic lymphedema.  No acute osseous findings, tricompartmental osteoarthritis left knee with a small moderate left knee joint effusion CT of the chest shows: Pulmonary embolus demonstrated the left upper pulmonary arterial tree.  This serves the anterior aspect of the left upper lobe which is affected by patchy density probably representing pulmonary infarction. Enlarging ovoid density anterior to the trachea at the thoracic inlet. Aortic atherosclerosis/coronary artery calcification Intake/Output from previous day: 07/14 0701 - 07/15 0700 In: 4303 [P.O.:1200; I.V.:2603; IV Piggyback:500] Out: 2050 [Urine:2050] Intake/Output this shift: No intake/output data  recorded.  General appearance: alert, cooperative, and no distress Incision/Wound: The wound looks good with wet-to-dry dressing. No surrounding erythema. Appropriate drainage.     Lab Results:  Recent Labs    02/03/21 0340 02/04/21 0551  WBC 10.9* 10.3  HGB 10.6* 10.0*  HCT 32.6* 30.7*  PLT 262 269     BMET Recent Labs    02/02/21 0116 02/03/21 0340  NA 126* 132*  K 3.0* 3.4*  CL 97* 102  CO2 23 24  GLUCOSE 468* 227*  BUN 14 16  CREATININE 1.13* 1.04*  CALCIUM 7.9* 8.4*    PT/INR Recent Labs    02/02/21 0116  LABPROT 14.0  INR 1.1     Recent Labs  Lab 02/01/21 1233 02/02/21 0116 02/03/21 0340  AST 9* 9* 8*  ALT 9 8 7   ALKPHOS 81 66 55  BILITOT 0.7 0.8 0.4  PROT 7.5 6.2* 6.4*  ALBUMIN 2.2* 1.8* 1.8*      Lipase     Component Value Date/Time   LIPASE 20 09/05/2011 0120     Medications:  (feeding supplement) PROSource Plus  30 mL Oral BID BM   acetaminophen  650 mg Oral Q6H   buPROPion  300 mg Oral Daily   Chlorhexidine Gluconate Cloth  6 each Topical Daily   enoxaparin (LOVENOX) injection  150 mg Subcutaneous Q12H   fluconazole  400 mg Oral QHS    HYDROmorphone (DILAUDID) injection  0.5 mg Intravenous Once   insulin aspart  0-20 Units Subcutaneous Q4H   insulin aspart  6 Units Subcutaneous TID WC   insulin glargine  60 Units Subcutaneous Daily   lisinopril  2.5 mg Oral Daily   metoprolol tartrate  12.5 mg Oral BID   multivitamin with minerals  1  tablet Oral Daily   pantoprazole  40 mg Oral Daily   Ensure Max Protein  11 oz Oral Daily   traZODone  25 mg Oral QHS   venlafaxine XR  75 mg Oral Daily    Assessment/Plan s/p I&D of left thigh abscess 12/27/20 - evacuated small pocket of purulent drainage - culture with klebsiella pneumoniae, rare pseudomonas - abx per ID - drainage on dressing initially concerning for pseudomonal colonization which has been treated with bid Dakin's x72 hrs - wound is healing appropriately. Will discontinue  Dakins and can continue bid wet to dry with normal saline  We will sign off. Please contact use with any questions or concerns   FEN: CM VTE: lovenox ID: [email or web address] 7/12; Diflucan 7/12>>day 2; daptomycin 7/13>> Unasyn 7/13>>   - per primary attending - Morbid obesity - BMI 64.9 Hx of bilateral PE 04/29/2008  PE off anticoagulation Uncontrolled T2DM  Plan: Wet-to-dry dressings with Dakin's for 3 days then resume normal saline.  Antibiotics per infectious disease.     LOS: 3 days    Eric Form 02/04/2021 Please see Amion

## 2021-02-04 NOTE — Progress Notes (Signed)
Patient's heparin dose resulted subtherapeutic. Pharmacy instructed this nurse to titrate heparin gtt to 4100 units and to give a 3000 unit bolus. 3,000 unit bolus was given, however, pump does not allow heparin gtt to go up to 4100 units. Pharmacy made aware, gtt is currently infusing at 3800 units until a resolution is made.

## 2021-02-04 NOTE — Discharge Instructions (Signed)
WOUND CARE: - dressing to be changed twice daily - supplies: sterile saline, kerlix/guaze, scissors, ABD pads, tape  - remove dressing and all packing carefully, moistening with sterile saline as needed to avoid packing/internal dressing sticking to the wound. - clean edges of skin around the wound with water/gauze, making sure there is no tape debris or leakage left on skin that could cause skin irritation or breakdown. - dampen clean kerlix/gauze with sterile saline and pack wound from wound base to skin level, making sure to take note of any possible areas of wound tracking, tunneling and packing appropriately. Wound can be packed loosely. Trim kerlix/gauze to size if a whole roll/piece is not required. - cover wound with a dry ABD pad and secure with tape.  - write the date/time on the dry dressing/tape to better track when the last dressing change occurred. - apply any skin protectant/powder recommended by clinician to protect skin/skin folds. - change dressing as needed if leakage occurs, wound gets contaminated, or patient requests to shower. - patient may shower daily with wound open and following the shower the wound should be dried and a clean dressing placed.   

## 2021-02-05 ENCOUNTER — Inpatient Hospital Stay (HOSPITAL_COMMUNITY): Payer: Medicare Other

## 2021-02-05 DIAGNOSIS — F419 Anxiety disorder, unspecified: Secondary | ICD-10-CM | POA: Diagnosis not present

## 2021-02-05 DIAGNOSIS — I2699 Other pulmonary embolism without acute cor pulmonale: Secondary | ICD-10-CM | POA: Diagnosis not present

## 2021-02-05 DIAGNOSIS — R9431 Abnormal electrocardiogram [ECG] [EKG]: Secondary | ICD-10-CM

## 2021-02-05 DIAGNOSIS — F32A Depression, unspecified: Secondary | ICD-10-CM | POA: Diagnosis not present

## 2021-02-05 DIAGNOSIS — A419 Sepsis, unspecified organism: Secondary | ICD-10-CM | POA: Diagnosis not present

## 2021-02-05 LAB — ECHOCARDIOGRAM COMPLETE
Area-P 1/2: 3.11 cm2
Calc EF: 53.3 %
Height: 66 in
MV M vel: 5.02 m/s
MV Peak grad: 100.8 mmHg
S' Lateral: 4.8 cm
Single Plane A2C EF: 54.2 %
Single Plane A4C EF: 53.4 %
Weight: 6430.38 oz

## 2021-02-05 LAB — BASIC METABOLIC PANEL
Anion gap: 10 (ref 5–15)
BUN: 20 mg/dL (ref 6–20)
CO2: 23 mmol/L (ref 22–32)
Calcium: 8.8 mg/dL — ABNORMAL LOW (ref 8.9–10.3)
Chloride: 104 mmol/L (ref 98–111)
Creatinine, Ser: 1.18 mg/dL — ABNORMAL HIGH (ref 0.44–1.00)
GFR, Estimated: 59 mL/min — ABNORMAL LOW (ref >60)
Glucose, Bld: 108 mg/dL — ABNORMAL HIGH (ref 70–99)
Potassium: 3.6 mmol/L (ref 3.5–5.1)
Sodium: 137 mmol/L (ref 135–145)

## 2021-02-05 LAB — CBC
HCT: 31.8 % — ABNORMAL LOW (ref 36.0–46.0)
Hemoglobin: 9.8 g/dL — ABNORMAL LOW (ref 12.0–15.0)
MCH: 28.2 pg (ref 26.0–34.0)
MCHC: 30.8 g/dL (ref 30.0–36.0)
MCV: 91.4 fL (ref 80.0–100.0)
Platelets: 294 10*3/uL (ref 150–400)
RBC: 3.48 MIL/uL — ABNORMAL LOW (ref 3.87–5.11)
RDW: 14.2 % (ref 11.5–15.5)
WBC: 8.9 10*3/uL (ref 4.0–10.5)
nRBC: 0 % (ref 0.0–0.2)

## 2021-02-05 LAB — GLUCOSE, CAPILLARY
Glucose-Capillary: 101 mg/dL — ABNORMAL HIGH (ref 70–99)
Glucose-Capillary: 106 mg/dL — ABNORMAL HIGH (ref 70–99)
Glucose-Capillary: 123 mg/dL — ABNORMAL HIGH (ref 70–99)
Glucose-Capillary: 150 mg/dL — ABNORMAL HIGH (ref 70–99)
Glucose-Capillary: 161 mg/dL — ABNORMAL HIGH (ref 70–99)
Glucose-Capillary: 175 mg/dL — ABNORMAL HIGH (ref 70–99)
Glucose-Capillary: 230 mg/dL — ABNORMAL HIGH (ref 70–99)

## 2021-02-05 LAB — HEPARIN ANTI-XA: Heparin LMW: 0.86 IU/mL

## 2021-02-05 MED ORDER — METOCLOPRAMIDE HCL 5 MG PO TABS: 5.0000 mg | ORAL_TABLET | Freq: Four times a day (QID) | ORAL | Status: DC | PRN

## 2021-02-05 NOTE — Progress Notes (Signed)
ANTICOAGULATION CONSULT NOTE  Pharmacy Consult for heparin >> LMWH Indication: pulmonary embolus  Allergies  Allergen Reactions   Clindamycin Nausea And Vomiting   Zofran Itching and Nausea And Vomiting   Doxycycline Nausea And Vomiting   Morphine And Related Itching    Patient Measurements: Height: 5\' 6"  (167.6 cm) Weight: (!) 182.3 kg (401 lb 14.4 oz) (01/03/21) IBW/kg (Calculated) : 59.3 Heparin Dosing Weight: 108.5 kg  Vital Signs: Temp: 97.9 F (36.6 C) (07/16 1246) Temp Source: Oral (07/16 1246) BP: 116/69 (07/16 1246) Pulse Rate: 64 (07/16 1246)  Labs: Recent Labs    02/03/21 0340 02/03/21 1245 02/03/21 1700 02/04/21 0005 02/04/21 0551 02/05/21 0448 02/05/21 1457  HGB 10.6*  --   --   --  10.0* 9.8*  --   HCT 32.6*  --   --   --  30.7* 31.8*  --   PLT 262  --   --   --  269 294  --   HEPARINUNFRC 0.18*   < > 0.23* 0.28* 0.15*  --   --   HEPRLOWMOCWT  --   --   --   --   --   --  0.86  CREATININE 1.04*  --   --   --   --  1.18*  --   CKTOTAL 22*  --   --   --   --   --   --    < > = values in this interval not displayed.     Estimated Creatinine Clearance: 105.3 mL/min (A) (by C-G formula based on SCr of 1.18 mg/dL (H)).   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  Per EDP note "Patient states that she is not been taking her Xarelto for the past month due to her nausea vomiting at times".   Wt from 01/03/21 = 182.3 kg, Ht 66 inches. Baseline labs: aPTT 39 sec, INR 1.0, CBC WNL.   02/05/2021 Anti-Xa LMWH: 0.86 Hgb down to 9.8 but stable, plts WNL SCr stable ~ 1 this admission   Goal of Therapy:  Anti-Xa 0.6-1 Monitor platelets by anticoagulation protocol: Yes   Plan:  Continue Lovenox 150 mg q 12 hours  Monitor renal  function, repeat anti-Xa prn  02/07/2021  02/05/21 5:38 PM

## 2021-02-05 NOTE — Progress Notes (Signed)
  Echocardiogram 2D Echocardiogram has been performed.  Stark Bray Swaim 02/05/2021, 10:07 AM

## 2021-02-05 NOTE — Progress Notes (Addendum)
PROGRESS NOTE    Karen Bennett  TGG:[id number] DOB: 12-10-1975 DOA: 02/01/2021 PCP: Pablo Lawrence, NP   Brief Narrative: 45 y.o. female with medical history significant of type 2 diabetes mellitus, prior history of DVT/PE, right BKA, super morbid obesity, fungemia, endocarditis who was hospitalized from 12/26/2020 through 01/05/2021 for left lateral thigh abscess and during this hospitalization she was also diagnosed with Candida albicans fungemia/mitral valve endocarditis and she was discharged home on IV micafungin for 6 weeks.  She completed 9 days of IV antibiotics for left thigh abscess which grew Pseudomonas.  She comes to the ER today for advice from the home health nurse which found out that her blood sugar was more than 500.  Although patient herself did not have any complaints.  However having said that, she tells me that she has been having intermittent nausea and vomiting at home since she has been started on antifungals at home.  Initially she claimed that she has not been able to eat or drink anything at home.  When I brought up the fact that she does not look dehydrated for a person who has not had anything to eat for a month.  She felt offended and changed her statement that she has intermittent symptoms and not all the time.  Her friend/neighbor is at the bedside who has been talking for the patient for most part and unfortunately continues to interrupt me from interviewing the patient.  For some reason, they seem to be hostile against medical community and do not seem to be happy with the care they have received so far.  Regarding her, she was not having any chills, sweating or fever at home.   ED Course: Upon arrival to the ED, her temperature was 100.5, she was tachycardic and tachypneic.  Slightly elevated blood pressure as well.  Mild leukocytosis 10.9.  Yet once again hyponatremia with sodium of 126.  CKD stage IIIa at baseline.  Blood sugar 517 but anion gap normal.  No DKA.   Lactic acid normal.  She is not vaccinated for COVID, however COVID test is negative here.  She was already seen by theGeneral surgery in the ED and they evacuated small pocket of purulent drainage from the left lateral thigh.  Cultures were sent.  They recommended dressing changes with Dakin's x72 hours.  Recommended CT of the left thigh which ruled out abscess.  ED physician also discussed the case with Dr. Graylon Good of ID and per her recommendations, patient was started on cefepime and Rocephin.  Hospital service was then consulted to admit the patient for further management.  CT angiogram was also done which once again showed left-sided PE which is chronic.  Patient was started on heparin drip as well.    Assessment & Plan:   Active Problems:   DM (diabetes mellitus), type 2, uncontrolled with complications (HCC)   Pulmonary emboli (HCC)   Anxiety and depression   HTN (hypertension)   Infective endocarditis   Sepsis ([place])  #1 sepsis present on admission secondary to left thigh abscess growing Klebsiella and Pseudomonas/VRE and acinetobacter bacteremia.  She met criteria for sepsis at the time of admission with tachypnea tachycardia fever and leukocytosis.  She was being treated as an outpatient for fungal mitral valve endocarditis and had a PICC line on admission.  It was thought she had PICC line related bloodstream infection with VRE and Acinetobacter.  PICC line was removed on 02/03/2021.  Repeat blood cultures were done after the PICC line was  removed which are negative so far.  She is status post incision and drainage of the left thigh abscess with Dakin's solution dressings to be done for daily  Klebsiella and pseudomonas  from left leg culture, VRE and Acinetobacter from blood culture  Followed by ID and general surgery. Was being treated for fungal endocarditis as an outpatient PICC line dcd 7/14 repeat culture negative plan for PICC line on Monday ON IMIPENEM SINCE 7/15 TO COVER  ALL-Klebsiella Pseudomonas Acinetobacter VRE  #2 Candida mitral valve endocarditis- currently on Diflucan p.o. since she have only limited IV access.  She was not able to tolerate caspofungin  as an outpatient.  #3 uncontrolled type 2 diabetes with hyperglycemia-CBG improved since increasing Lantus to 60 units and NovoLog to 6 units 3 times a day.   CBG (last 3)  Recent Labs    02/05/21 0022 02/05/21 0411 02/05/21 0717  GLUCAP 175* 123* 101*    #4 CKD stage IIIa at baseline  #5 history of essential hypertension on lisinopril and Lopressor.  #6 anxiety and depression on Wellbutrin and Effexor  #7 hyperlipidemia continue statin  #8 hypokalemia repleted  #9 chronic pulmonary embolism -on Lovenox for now restart Xarelto once procedures are done.  She still has to get a PICC line.    #10 pseudo hyponatremia due to hyperglycemia resolved.follow up labs in am   Pressure Injury 12/27/20 Buttocks Right;Left Stage 2 -  Partial thickness loss of dermis presenting as a shallow open injury with a red, pink wound bed without slough. (Active)  12/27/20 1200  Location: Buttocks  Location Orientation: Right;Left  Staging: Stage 2 -  Partial thickness loss of dermis presenting as a shallow open injury with a red, pink wound bed without slough.  Wound Description (Comments):   Present on Admission:      Estimated body mass index is 64.87 kg/m as calculated from the following:   Height as of this encounter: $RemoveBeforeD'5\' 6"'[id number]$  (1.676 m).   Weight as of this encounter: 182.3 kg.  DVT prophylaxis: IV heparin Code Status: Full code Family Communication: None at bedside Disposition Plan: Home  Remains inpatient appropriate because:IV treatments appropriate due to intensity of illness or inability to take PO  Dispo: The patient is from: Home              Anticipated d/c is to: Home              Patient currently is not medically stable to d/c.   Difficult to place patient No    Consultants: General  surgery and infectious disease  Procedures: Incision and drainage of the left thigh abscess 02/02/2021 Antimicrobials: Anti-infectives (From admission, onward)    Start     Dose/Rate Route Frequency Ordered Stop   02/04/21 1800  imipenem-cilastatin (PRIMAXIN) 1,000 mg in sodium chloride 0.9 % 250 mL IVPB        1,000 mg 250 mL/hr over 60 Minutes Intravenous Every 8 hours 02/04/21 1452     02/03/21 2200  fluconazole (DIFLUCAN) tablet 400 mg        400 mg Oral Daily at bedtime 02/03/21 1528     02/03/21 2200  linezolid (ZYVOX) tablet 600 mg  Status:  Discontinued        600 mg Oral Every 12 hours 02/03/21 1528 02/04/21 1452   02/02/21 2000  DAPTOmycin (CUBICIN) 1,200 mg in sodium chloride 0.9 % IVPB  Status:  Discontinued        1,200 mg 148 mL/hr over 30  Minutes Intravenous Daily 02/02/21 0805 02/03/21 1528   02/02/21 0530  DAPTOmycin (CUBICIN) 850 mg in sodium chloride 0.9 % IVPB        8 mg/kg  108.5 kg (Adjusted) 134 mL/hr over 30 Minutes Intravenous  Once 02/02/21 0431 02/02/21 0611   02/02/21 0530  Ampicillin-Sulbactam (UNASYN) 3 g in sodium chloride 0.9 % 100 mL IVPB  Status:  Discontinued        3 g 200 mL/hr over 30 Minutes Intravenous Every 6 hours 02/02/21 0431 02/04/21 1452   02/02/21 0500  Vancomycin (VANCOCIN) 1,250 mg in sodium chloride 0.9 % 250 mL IVPB  Status:  Discontinued        1,250 mg 166.7 mL/hr over 90 Minutes Intravenous Every 12 hours 02/01/21 1647 02/02/21 0431   02/01/21 2200  ceFEPIme (MAXIPIME) 2 g in sodium chloride 0.9 % 100 mL IVPB  Status:  Discontinued        2 g 200 mL/hr over 30 Minutes Intravenous Every 8 hours 02/01/21 1639 02/02/21 0431   02/01/21 2200  fluconazole (DIFLUCAN) IVPB 400 mg  Status:  Discontinued        400 mg 100 mL/hr over 120 Minutes Intravenous Every 24 hours 02/01/21 1732 02/03/21 1528   02/01/21 1800  anidulafungin (ERAXIS) 200 mg in sodium chloride 0.9 % 200 mL IVPB  Status:  Discontinued        200 mg 78 mL/hr over 200  Minutes Intravenous Every 24 hours 02/01/21 1639 02/01/21 1731   02/01/21 1700  ceFEPIme (MAXIPIME) 2 g in sodium chloride 0.9 % 100 mL IVPB  Status:  Discontinued        2 g 200 mL/hr over 30 Minutes Intravenous  Once 02/01/21 1654 02/01/21 1658   02/01/21 1700  vancomycin (VANCOCIN) IVPB 1000 mg/200 mL premix  Status:  Discontinued        1,000 mg 200 mL/hr over 60 Minutes Intravenous  Once 02/01/21 1654 02/01/21 1659   02/01/21 1400  ceFEPIme (MAXIPIME) 2 g in sodium chloride 0.9 % 100 mL IVPB        2 g 200 mL/hr over 30 Minutes Intravenous  Once 02/01/21 1347 02/01/21 1605   02/01/21 1400  vancomycin (VANCOCIN) 2,500 mg in sodium chloride 0.9 % 500 mL IVPB        2,500 mg 250 mL/hr over 120 Minutes Intravenous  Once 02/01/21 1348 02/01/21 1805        Subjective: She is resting in bed. Blood cultures done 02/03/2021 with no growth.  First blood culture  VRE and Acinetobacter bacteremia   Objective: Vitals:   02/04/21 1001 02/04/21 1344 02/04/21 2009 02/05/21 0415  BP: 138/76 117/68 (!) 113/57 139/61  Pulse: 77 63 68 68  Resp: $Remo'18 20 18 16  'RdDSz$ Temp: 98.4 F (36.9 C) 97.8 F (36.6 C) 98.1 F (36.7 C) 98.1 F (36.7 C)  TempSrc: Oral Oral Oral Oral  SpO2: 93% 96% 94% 94%  Weight:      Height:        Intake/Output Summary (Last 24 hours) at 02/05/2021 1012 Last data filed at 02/05/2021 0334 Gross per 24 hour  Intake 1954.52 ml  Output 850 ml  Net 1104.52 ml    Filed Weights   02/01/21 1409  Weight: (!) 182.3 kg    Examination:  General exam: Appears in distress due to pain Respiratory system: Clear to auscultation. Respiratory effort normal. Cardiovascular system: S1 & S2 heard, RRR. No JVD, murmurs, rubs, gallops or clicks. No pedal edema.  Gastrointestinal system: Abdomen is nondistended, soft and nontender. No organomegaly or masses felt. Normal bowel sounds heard. Central nervous system: Alert and oriented. No focal neurological deficits. Extremities: Right BKA,  left lower extremity with 1+ edema skin: No rashes, lesions or ulcers Psychiatry: Judgement and insight appear normal. Mood & affect appropriate.     Data Reviewed: I have personally reviewed following labs and imaging studies  CBC: Recent Labs  Lab 02/01/21 1233 02/02/21 0116 02/03/21 0340 02/04/21 0551 02/05/21 0448  WBC 10.9* 11.3* 10.9* 10.3 8.9  HGB 13.3 10.8* 10.6* 10.0* 9.8*  HCT 39.5 32.3* 32.6* 30.7* 31.8*  MCV 84.9 85.2 86.7 88.2 91.4  PLT 265 231 262 269 832    Basic Metabolic Panel: Recent Labs  Lab 02/01/21 1233 02/01/21 1650 02/02/21 0116 02/03/21 0340 02/05/21 0448  NA 126*  --  126* 132* 137  K 3.5  --  3.0* 3.4* 3.6  CL 95*  --  97* 102 104  CO2 21*  --  $R'23 24 23  'Kv$ GLUCOSE 517* 524* 468* 227* 108*  BUN 12  --  $R'14 16 20  'Jo$ CREATININE 1.12*  --  1.13* 1.04* 1.18*  CALCIUM 8.7*  --  7.9* 8.4* 8.8*  MG  --  1.9  --   --   --     GFR: Estimated Creatinine Clearance: 105.3 mL/min (A) (by C-G formula based on SCr of 1.18 mg/dL (H)). Liver Function Tests: Recent Labs  Lab 02/01/21 1233 02/02/21 0116 02/03/21 0340  AST 9* 9* 8*  ALT $Re'9 8 7  'Xel$ ALKPHOS 81 66 55  BILITOT 0.7 0.8 0.4  PROT 7.5 6.2* 6.4*  ALBUMIN 2.2* 1.8* 1.8*    No results for input(s): LIPASE, AMYLASE in the last 168 hours. No results for input(s): AMMONIA in the last 168 hours. Coagulation Profile: Recent Labs  Lab 02/01/21 1233 02/02/21 0116  INR 1.0 1.1    Cardiac Enzymes: Recent Labs  Lab 02/03/21 0340  CKTOTAL 22*    BNP (last 3 results) No results for input(s): PROBNP in the last 8760 hours. HbA1C: No results for input(s): HGBA1C in the last 72 hours. CBG: Recent Labs  Lab 02/04/21 1548 02/04/21 2006 02/05/21 0022 02/05/21 0411 02/05/21 0717  GLUCAP 178* 178* 175* 123* 101*    Lipid Profile: No results for input(s): CHOL, HDL, LDLCALC, TRIG, CHOLHDL, LDLDIRECT in the last 72 hours. Thyroid Function Tests: No results for input(s): TSH, T4TOTAL, FREET4,  T3FREE, THYROIDAB in the last 72 hours. Anemia Panel: No results for input(s): VITAMINB12, FOLATE, FERRITIN, TIBC, IRON, RETICCTPCT in the last 72 hours. Sepsis Labs: Recent Labs  Lab 02/01/21 1301 02/01/21 1501 02/02/21 0116  PROCALCITON  --   --  3.75  LATICACIDVEN 1.2 1.3  --      Recent Results (from the past 240 hour(s))  Urine culture     Status: Abnormal   Collection Time: 02/01/21 12:33 PM   Specimen: In/Out Cath Urine  Result Value Ref Range Status   Specimen Description   Final    IN/OUT CATH URINE Performed at [facility], [place] [address] [place], [place] [address]    Special Requests   Final    NONE Performed at [facility], [place] [address] [place], [place] [address]    Culture >=100,000 COLONIES/mL KLEBSIELLA PNEUMONIAE (A)  Final   Report Status 02/04/2021 FINAL  Final   Organism ID, Bacteria KLEBSIELLA PNEUMONIAE (A)  Final      Susceptibility   Klebsiella  pneumoniae - MIC*    AMPICILLIN >=32 RESISTANT Resistant     CEFAZOLIN <=4 SENSITIVE Sensitive     CEFEPIME <=0.12 SENSITIVE Sensitive     CEFTRIAXONE <=0.25 SENSITIVE Sensitive     CIPROFLOXACIN <=0.25 SENSITIVE Sensitive     GENTAMICIN <=1 SENSITIVE Sensitive     IMIPENEM <=0.25 SENSITIVE Sensitive     NITROFURANTOIN 64 INTERMEDIATE Intermediate     TRIMETH/SULFA <=20 SENSITIVE Sensitive     AMPICILLIN/SULBACTAM 8 SENSITIVE Sensitive     PIP/TAZO <=4 SENSITIVE Sensitive     * >=100,000 COLONIES/mL KLEBSIELLA PNEUMONIAE  Resp Panel by RT-PCR (Flu A&B, Covid) Nasopharyngeal Swab     Status: None   Collection Time: 02/01/21  1:02 PM   Specimen: Nasopharyngeal Swab; Nasopharyngeal(NP) swabs in vial transport medium  Result Value Ref Range Status   SARS Coronavirus 2 by RT PCR NEGATIVE NEGATIVE Final    Comment: (NOTE) SARS-CoV-2 target nucleic acids are NOT DETECTED.  The SARS-CoV-2 RNA is generally detectable in upper respiratory specimens during the acute  phase of infection. The lowest concentration of SARS-CoV-2 viral copies this assay can detect is 138 copies/mL. A negative result does not preclude SARS-Cov-2 infection and should not be used as the sole basis for treatment or other patient management decisions. A negative result may occur with  improper specimen collection/handling, submission of specimen other than nasopharyngeal swab, presence of viral mutation(s) within the areas targeted by this assay, and inadequate number of viral copies(<138 copies/mL). A negative result must be combined with clinical observations, patient history, and epidemiological information. The expected result is Negative.  Fact Sheet for Patients:  EntrepreneurPulse.com.au  Fact Sheet for Healthcare Providers:  IncredibleEmployment.be  This test is no t yet approved or cleared by the [place] FDA and  has been authorized for detection and/or diagnosis of SARS-CoV-2 by FDA under an Emergency Use Authorization (EUA). This EUA will remain  in effect (meaning this test can be used) for the duration of the COVID-19 declaration under Section 564(b)(1) of the Act, 21 U.S.C.section 360bbb-3(b)(1), unless the authorization is terminated  or revoked sooner.       Influenza A by PCR NEGATIVE NEGATIVE Final   Influenza B by PCR NEGATIVE NEGATIVE Final    Comment: (NOTE) The Xpert Xpress SARS-CoV-2/FLU/RSV plus assay is intended as an aid in the diagnosis of influenza from Nasopharyngeal swab specimens and should not be used as a sole basis for treatment. Nasal washings and aspirates are unacceptable for Xpert Xpress SARS-CoV-2/FLU/RSV testing.  Fact Sheet for Patients: EntrepreneurPulse.com.au  Fact Sheet for Healthcare Providers: IncredibleEmployment.be  This test is not yet approved or cleared by the [place] FDA and has been authorized for detection and/or diagnosis of  SARS-CoV-2 by FDA under an Emergency Use Authorization (EUA). This EUA will remain in effect (meaning this test can be used) for the duration of the COVID-19 declaration under Section 564(b)(1) of the Act, 21 U.S.C. section 360bbb-3(b)(1), unless the authorization is terminated or revoked.  Performed at [facility], [place] [address] [place], [place] [address]   Blood culture (routine x 2)     Status: Abnormal   Collection Time: 02/01/21  1:40 PM   Specimen: BLOOD  Result Value Ref Range Status   Specimen Description   Final    BLOOD RIGHT ANTECUBITAL Performed at [place] [address] [place], [place] [address]    Special Requests   Final    BOTTLES DRAWN AEROBIC AND ANAEROBIC Blood Culture  results may not be optimal due to an inadequate volume of blood received in culture bottles Performed at [facility], [place] [address], [place], [place] [address]    Culture  Setup Time   Final    GRAM POSITIVE COCCI IN PAIRS IN BOTH AEROBIC AND ANAEROBIC BOTTLES GRAM NEGATIVE RODS AEROBIC BOTTLE ONLY CRITICAL VALUE NOTED.  VALUE IS CONSISTENT WITH PREVIOUSLY REPORTED AND CALLED VALUE.    Culture (A)  Final    ACINETOBACTER CALCOACETICUS/BAUMANNII COMPLEX ENTEROCOCCUS FAECALIS SUSCEPTIBILITIES PERFORMED ON PREVIOUS CULTURE WITHIN THE LAST 5 DAYS. Performed at [facility], [place] [address], [place], [place] [address]    Report Status 02/04/2021 FINAL  Final  Blood culture (routine x 2)     Status: Abnormal   Collection Time: 02/01/21  1:48 PM   Specimen: BLOOD RIGHT FOREARM  Result Value Ref Range Status   Specimen Description   Final    BLOOD RIGHT FOREARM Performed at [place] [address] [place], [place] [address]    Special Requests   Final    BOTTLES DRAWN AEROBIC AND ANAEROBIC Blood Culture adequate volume Performed at [place] [address], [place],  [place] [address]    Culture  Setup Time   Final    GRAM POSITIVE COCCI IN PAIRS IN BOTH AEROBIC AND ANAEROBIC BOTTLES GRAM NEGATIVE RODS AEROBIC BOTTLE ONLY CRITICAL RESULT CALLED TO, READ BACK BY AND VERIFIED WITH: Gustavo Lah [id number] 02/02/2021 Mena Goes Performed at [facility], [place] [address], [place], [place] [address]    Culture (A)  Final    ACINETOBACTER CALCOACETICUS/BAUMANNII COMPLEX VANCOMYCIN RESISTANT ENTEROCOCCUS    Report Status 02/04/2021 FINAL  Final   Organism ID, Bacteria ACINETOBACTER CALCOACETICUS/BAUMANNII COMPLEX  Final   Organism ID, Bacteria VANCOMYCIN RESISTANT ENTEROCOCCUS  Final      Susceptibility   Acinetobacter calcoaceticus/baumannii complex - MIC*    CEFTAZIDIME 16 INTERMEDIATE Intermediate     CIPROFLOXACIN 1 SENSITIVE Sensitive     GENTAMICIN <=1 SENSITIVE Sensitive     IMIPENEM <=0.25 SENSITIVE Sensitive     PIP/TAZO 32 INTERMEDIATE Intermediate     TRIMETH/SULFA <=20 SENSITIVE Sensitive     AMPICILLIN/SULBACTAM <=2 SENSITIVE Sensitive     * ACINETOBACTER CALCOACETICUS/BAUMANNII COMPLEX   Vancomycin resistant enterococcus - MIC*    AMPICILLIN <=2 SENSITIVE Sensitive     VANCOMYCIN >=32 RESISTANT Resistant     GENTAMICIN SYNERGY SENSITIVE Sensitive     LINEZOLID 2 SENSITIVE Sensitive     * VANCOMYCIN RESISTANT ENTEROCOCCUS  Blood Culture ID Panel (Reflexed)     Status: Abnormal   Collection Time: 02/01/21  1:48 PM  Result Value Ref Range Status   Enterococcus faecalis DETECTED (A) NOT DETECTED Final    Comment: CRITICAL RESULT CALLED TO, READ BACK BY AND VERIFIED WITH: EMariah Milling 0411 02/02/2021 T. TYSOR    Enterococcus Faecium NOT DETECTED NOT DETECTED Final   Listeria monocytogenes NOT DETECTED NOT DETECTED Final   Staphylococcus species NOT DETECTED NOT DETECTED Final   Staphylococcus aureus (BCID) NOT DETECTED NOT DETECTED Final   Staphylococcus epidermidis NOT DETECTED NOT DETECTED Final   Staphylococcus lugdunensis NOT  DETECTED NOT DETECTED Final   Streptococcus species NOT DETECTED NOT DETECTED Final   Streptococcus agalactiae NOT DETECTED NOT DETECTED Final   Streptococcus pneumoniae NOT DETECTED NOT DETECTED Final   Streptococcus pyogenes NOT DETECTED NOT DETECTED Final   A.calcoaceticus-baumannii DETECTED (A) NOT DETECTED Final    Comment: CRITICAL RESULT CALLED  TO, READ BACK BY AND VERIFIED WITH: E. JACKSON,PHARMD 0411 02/02/2021 T. TYSOR    Bacteroides fragilis NOT DETECTED NOT DETECTED Final   Enterobacterales NOT DETECTED NOT DETECTED Final   Enterobacter cloacae complex NOT DETECTED NOT DETECTED Final   Escherichia coli NOT DETECTED NOT DETECTED Final   Klebsiella aerogenes NOT DETECTED NOT DETECTED Final   Klebsiella oxytoca NOT DETECTED NOT DETECTED Final   Klebsiella pneumoniae NOT DETECTED NOT DETECTED Final   Proteus species NOT DETECTED NOT DETECTED Final   Salmonella species NOT DETECTED NOT DETECTED Final   Serratia marcescens NOT DETECTED NOT DETECTED Final   Haemophilus influenzae NOT DETECTED NOT DETECTED Final   Neisseria meningitidis NOT DETECTED NOT DETECTED Final   Pseudomonas aeruginosa NOT DETECTED NOT DETECTED Final   Stenotrophomonas maltophilia NOT DETECTED NOT DETECTED Final   Candida albicans NOT DETECTED NOT DETECTED Final   Candida auris NOT DETECTED NOT DETECTED Final   Candida glabrata NOT DETECTED NOT DETECTED Final   Candida krusei NOT DETECTED NOT DETECTED Final   Candida parapsilosis NOT DETECTED NOT DETECTED Final   Candida tropicalis NOT DETECTED NOT DETECTED Final   Cryptococcus neoformans/gattii NOT DETECTED NOT DETECTED Final   CTX-M ESBL NOT DETECTED NOT DETECTED Final   Carbapenem resistance IMP NOT DETECTED NOT DETECTED Final   Carbapenem resistance KPC NOT DETECTED NOT DETECTED Final   Carbapenem resistance NDM NOT DETECTED NOT DETECTED Final   Vancomycin resistance DETECTED (A) NOT DETECTED Final    Comment: CRITICAL RESULT CALLED TO, READ BACK BY  AND VERIFIED WITH: EFelisa Bonier 7200 02/02/2021 T. TYSOR    Carbapenem resistance VIM NOT DETECTED NOT DETECTED Final    Comment: Performed at [facility], [address] [place], [place] [address]  Aerobic/Anaerobic Culture w Gram Stain (surgical/deep wound)     Status: None (Preliminary result)   Collection Time: 02/01/21  1:49 PM   Specimen: Abscess  Result Value Ref Range Status   Specimen Description   Final    ABSCESS LEFT LEG Performed at [facility], [address], [place], [place] [address]    Special Requests   Final    Immunocompromised Performed at [facility], [address], [place], [place] [address]    Gram Stain   Final    FEW WBC PRESENT,BOTH PMN AND MONONUCLEAR FEW GRAM NEGATIVE RODS Performed at [facility], [address] [place] [address], [place], [place] [address]    Culture   Final    MODERATE KLEBSIELLA PNEUMONIAE RARE PSEUDOMONAS AERUGINOSA SUSCEPTIBILITIES TO FOLLOW NO ANAEROBES ISOLATED; CULTURE IN PROGRESS FOR 5 DAYS    Report Status PENDING  Incomplete   Organism ID, Bacteria KLEBSIELLA PNEUMONIAE  Final      Susceptibility   Klebsiella pneumoniae - MIC*    AMPICILLIN >=32 RESISTANT Resistant     CEFAZOLIN <=4 SENSITIVE Sensitive     CEFEPIME <=0.12 SENSITIVE Sensitive     CEFTAZIDIME <=1 SENSITIVE Sensitive     CEFTRIAXONE <=0.25 SENSITIVE Sensitive     CIPROFLOXACIN <=0.25 SENSITIVE Sensitive     GENTAMICIN <=1 SENSITIVE Sensitive     IMIPENEM <=0.25 SENSITIVE Sensitive     TRIMETH/SULFA <=20 SENSITIVE Sensitive     AMPICILLIN/SULBACTAM 4 SENSITIVE Sensitive     PIP/TAZO <=4 SENSITIVE Sensitive     * MODERATE KLEBSIELLA PNEUMONIAE  Culture, blood (Routine X 2) w Reflex to ID Panel     Status: None (Preliminary result)   Collection Time: 02/03/21  8:43 PM  Specimen: BLOOD LEFT HAND  Result Value Ref Range Status   Specimen Description   Final    BLOOD LEFT HAND Blood Culture adequate volume  BOTTLES DRAWN AEROBIC ONLY Performed at [place] [address], [place], [place] [address]    Special Requests   Final    NONE Performed at [facility], [place] [address] [place], [place] [address]    Culture   Final    NO GROWTH < 12 HOURS Performed at [place] [address] [place], [place] [address]    Report Status PENDING  Incomplete  Culture, blood (Routine X 2) w Reflex to ID Panel     Status: None (Preliminary result)   Collection Time: 02/03/21  8:43 PM   Specimen: BLOOD RIGHT ARM  Result Value Ref Range Status   Specimen Description   Final    BLOOD RIGHT ARM Blood Culture adequate volume BOTTLES DRAWN AEROBIC ONLY Performed at [place] [address] [place], [place] [address]    Special Requests   Final    NONE Performed at [facility], [place] [address], [place], [place] [address]    Culture   Final    NO GROWTH < 12 HOURS Performed at [place] [address], [place], [place] [id number]    Report Status PENDING  Incomplete          Radiology Studies: No results found.      Scheduled Meds:  (feeding supplement) PROSource Plus  30 mL Oral BID BM   acetaminophen  650 mg Oral Q6H   buPROPion  300 mg Oral Daily   Chlorhexidine Gluconate Cloth  6 each Topical Daily   enoxaparin (LOVENOX) injection  150 mg Subcutaneous Q12H   fluconazole  400 mg Oral QHS    HYDROmorphone (DILAUDID) injection  0.5 mg Intravenous Once   insulin aspart  0-20 Units Subcutaneous Q4H   insulin aspart  6 Units Subcutaneous TID WC   insulin glargine  60 Units Subcutaneous Daily   lisinopril  2.5 mg Oral Daily   metoprolol tartrate  12.5 mg Oral BID   multivitamin with minerals  1 tablet Oral Daily   pantoprazole  40 mg Oral Daily   Ensure Max Protein  11 oz Oral Daily   traZODone  25 mg Oral QHS   venlafaxine XR  75 mg Oral Daily   Continuous Infusions:  sodium  chloride 75 mL/hr at 02/04/21 1642   imipenem-cilastatin 1,000 mg (02/05/21 0251)   promethazine (PHENERGAN) injection (IM or IVPB) 25 mg (02/05/21 0424)     LOS: 4 days    Time spent: 40 minutes    Georgette Shell, MD 02/05/2021, 10:12 AM

## 2021-02-06 ENCOUNTER — Inpatient Hospital Stay: Payer: Self-pay

## 2021-02-06 DIAGNOSIS — A419 Sepsis, unspecified organism: Secondary | ICD-10-CM | POA: Diagnosis not present

## 2021-02-06 DIAGNOSIS — I2699 Other pulmonary embolism without acute cor pulmonale: Secondary | ICD-10-CM | POA: Diagnosis not present

## 2021-02-06 LAB — CBC
HCT: 32.6 % — ABNORMAL LOW (ref 36.0–46.0)
Hemoglobin: 9.9 g/dL — ABNORMAL LOW (ref 12.0–15.0)
MCH: 28 pg (ref 26.0–34.0)
MCHC: 30.4 g/dL (ref 30.0–36.0)
MCV: 92.1 fL (ref 80.0–100.0)
Platelets: 349 10*3/uL (ref 150–400)
RBC: 3.54 MIL/uL — ABNORMAL LOW (ref 3.87–5.11)
RDW: 14.2 % (ref 11.5–15.5)
WBC: 9.9 10*3/uL (ref 4.0–10.5)
nRBC: 0.2 % (ref 0.0–0.2)

## 2021-02-06 LAB — GLUCOSE, CAPILLARY
Glucose-Capillary: 134 mg/dL — ABNORMAL HIGH (ref 70–99)
Glucose-Capillary: 136 mg/dL — ABNORMAL HIGH (ref 70–99)
Glucose-Capillary: 136 mg/dL — ABNORMAL HIGH (ref 70–99)
Glucose-Capillary: 151 mg/dL — ABNORMAL HIGH (ref 70–99)
Glucose-Capillary: 86 mg/dL (ref 70–99)
Glucose-Capillary: 97 mg/dL (ref 70–99)

## 2021-02-06 LAB — BASIC METABOLIC PANEL
Anion gap: 7 (ref 5–15)
BUN: 21 mg/dL — ABNORMAL HIGH (ref 6–20)
CO2: 25 mmol/L (ref 22–32)
Calcium: 9 mg/dL (ref 8.9–10.3)
Chloride: 106 mmol/L (ref 98–111)
Creatinine, Ser: 1.08 mg/dL — ABNORMAL HIGH (ref 0.44–1.00)
GFR, Estimated: >60 mL/min (ref >60)
Glucose, Bld: 119 mg/dL — ABNORMAL HIGH (ref 70–99)
Potassium: 4.3 mmol/L (ref 3.5–5.1)
Sodium: 138 mmol/L (ref 135–145)

## 2021-02-06 NOTE — Plan of Care (Signed)
  Problem: Clinical Measurements: Goal: Diagnostic test results will improve Outcome: Progressing Goal: Cardiovascular complication will be avoided Outcome: Progressing   Problem: Coping: Goal: Level of anxiety will decrease Outcome: Progressing   Problem: Pain Managment: Goal: General experience of comfort will improve Outcome: Progressing   

## 2021-02-06 NOTE — Progress Notes (Signed)
PROGRESS NOTE    Karen Bennett  TGG:[id number] DOB: 12-10-1975 DOA: 02/01/2021 PCP: Pablo Lawrence, NP   Brief Narrative: 45 y.o. female with medical history significant of type 2 diabetes mellitus, prior history of DVT/PE, right BKA, super morbid obesity, fungemia, endocarditis who was hospitalized from 12/26/2020 through 01/05/2021 for left lateral thigh abscess and during this hospitalization she was also diagnosed with Candida albicans fungemia/mitral valve endocarditis and she was discharged home on IV micafungin for 6 weeks.  She completed 9 days of IV antibiotics for left thigh abscess which grew Pseudomonas.  She comes to the ER today for advice from the home health nurse which found out that her blood sugar was more than 500.  Although patient herself did not have any complaints.  However having said that, she tells me that she has been having intermittent nausea and vomiting at home since she has been started on antifungals at home.  Initially she claimed that she has not been able to eat or drink anything at home.  When I brought up the fact that she does not look dehydrated for a person who has not had anything to eat for a month.  She felt offended and changed her statement that she has intermittent symptoms and not all the time.  Her friend/neighbor is at the bedside who has been talking for the patient for most part and unfortunately continues to interrupt me from interviewing the patient.  For some reason, they seem to be hostile against medical community and do not seem to be happy with the care they have received so far.  Regarding her, she was not having any chills, sweating or fever at home.   ED Course: Upon arrival to the ED, her temperature was 100.5, she was tachycardic and tachypneic.  Slightly elevated blood pressure as well.  Mild leukocytosis 10.9.  Yet once again hyponatremia with sodium of 126.  CKD stage IIIa at baseline.  Blood sugar 517 but anion gap normal.  No DKA.   Lactic acid normal.  She is not vaccinated for COVID, however COVID test is negative here.  She was already seen by theGeneral surgery in the ED and they evacuated small pocket of purulent drainage from the left lateral thigh.  Cultures were sent.  They recommended dressing changes with Dakin's x72 hours.  Recommended CT of the left thigh which ruled out abscess.  ED physician also discussed the case with Dr. Graylon Good of ID and per her recommendations, patient was started on cefepime and Rocephin.  Hospital service was then consulted to admit the patient for further management.  CT angiogram was also done which once again showed left-sided PE which is chronic.  Patient was started on heparin drip as well.    Assessment & Plan:   Active Problems:   DM (diabetes mellitus), type 2, uncontrolled with complications (HCC)   Pulmonary emboli (HCC)   Anxiety and depression   HTN (hypertension)   Infective endocarditis   Sepsis ([place])  #1 sepsis present on admission secondary to left thigh abscess growing Klebsiella and Pseudomonas/VRE and acinetobacter bacteremia.  She met criteria for sepsis at the time of admission with tachypnea tachycardia fever and leukocytosis.  She was being treated as an outpatient for fungal mitral valve endocarditis and had a PICC line on admission.  It was thought she had PICC line related bloodstream infection with VRE and Acinetobacter.  PICC line was removed on 02/03/2021.  Repeat blood cultures were done after the PICC line was  removed which are negative so far.  She is status post incision and drainage of the left thigh abscess with Dakin's solution dressings to be done for daily  Klebsiella and pseudomonas  from left leg culture, VRE and Acinetobacter from blood culture  Followed by ID and general surgery. Was being treated for fungal endocarditis as an outpatient PICC line dcd 7/14 repeat culture negative plan for PICC line on Monday ON IMIPENEM SINCE 7/15 TO COVER  ALL-Klebsiella Pseudomonas Acinetobacter VRE Blood cultures remain negative from 02/03/2021.  Will order for PICC line placement on 02/07/2021.  #2 Candida mitral valve endocarditis- currently on Diflucan p.o. since she have only limited IV access.  She was not able to tolerate caspofungin  as an outpatient.  #3 uncontrolled type 2 diabetes with hyperglycemia-CBG improved since increasing Lantus to 60 units and NovoLog to 6 units 3 times a day.   CBG (last 3)  Recent Labs    02/06/21 0424 02/06/21 0803 02/06/21 1133  GLUCAP 136* 97 134*    #4 CKD stage IIIa at baseline  #5 history of essential hypertension on lisinopril and Lopressor.  #6 anxiety and depression on Wellbutrin and Effexor  #7 hyperlipidemia continue statin  #8 hypokalemia repleted  #9 chronic pulmonary embolism -on Lovenox for now restart Xarelto once procedures are done.  She still has to get a PICC line.    #10 pseudo hyponatremia due to hyperglycemia resolved.follow up labs in am   Pressure Injury 12/27/20 Buttocks Right;Left Stage 2 -  Partial thickness loss of dermis presenting as a shallow open injury with a red, pink wound bed without slough. (Active)  12/27/20 1200  Location: Buttocks  Location Orientation: Right;Left  Staging: Stage 2 -  Partial thickness loss of dermis presenting as a shallow open injury with a red, pink wound bed without slough.  Wound Description (Comments):   Present on Admission:      Estimated body mass index is 64.87 kg/m as calculated from the following:   Height as of this encounter: _0  (1.676 m).   Weight as of this encounter: 182.3 kg.  DVT prophylaxis: IV heparin Code Status: Full code Family Communication: None at bedside Disposition Plan: Home  Remains inpatient appropriate because:IV treatments appropriate due to intensity of illness or inability to take PO  Dispo: The patient is from: Home              Anticipated d/c is to: Home              Patient  currently is not medically stable to d/c.   Difficult to place patient No    Consultants: General surgery and infectious disease  Procedures: Incision and drainage of the left thigh abscess 02/02/2021 Antimicrobials: Anti-infectives (From admission, onward)    Start     Dose/Rate Route Frequency Ordered Stop   02/04/21 1800  imipenem-cilastatin (PRIMAXIN) 1,000 mg in sodium chloride 0.9 % 250 mL IVPB        1,000 mg 250 mL/hr over 60 Minutes Intravenous Every 8 hours 02/04/21 1452     02/03/21 2200  fluconazole (DIFLUCAN) tablet 400 mg        400 mg Oral Daily at bedtime 02/03/21 1528     02/03/21 2200  linezolid (ZYVOX) tablet 600 mg  Status:  Discontinued        600 mg Oral Every 12 hours 02/03/21 1528 02/04/21 1452   02/02/21 2000  DAPTOmycin (CUBICIN) 1,200 mg in sodium chloride 0.9 % IVPB  Status:  Discontinued        1,200 mg 148 mL/hr over 30 Minutes Intravenous Daily 02/02/21 0805 02/03/21 1528   02/02/21 0530  DAPTOmycin (CUBICIN) 850 mg in sodium chloride 0.9 % IVPB        8 mg/kg  108.5 kg (Adjusted) 134 mL/hr over 30 Minutes Intravenous  Once 02/02/21 0431 02/02/21 0611   02/02/21 0530  Ampicillin-Sulbactam (UNASYN) 3 g in sodium chloride 0.9 % 100 mL IVPB  Status:  Discontinued        3 g 200 mL/hr over 30 Minutes Intravenous Every 6 hours 02/02/21 0431 02/04/21 1452   02/02/21 0500  Vancomycin (VANCOCIN) 1,250 mg in sodium chloride 0.9 % 250 mL IVPB  Status:  Discontinued        1,250 mg 166.7 mL/hr over 90 Minutes Intravenous Every 12 hours 02/01/21 1647 02/02/21 0431   02/01/21 2200  ceFEPIme (MAXIPIME) 2 g in sodium chloride 0.9 % 100 mL IVPB  Status:  Discontinued        2 g 200 mL/hr over 30 Minutes Intravenous Every 8 hours 02/01/21 1639 02/02/21 0431   02/01/21 2200  fluconazole (DIFLUCAN) IVPB 400 mg  Status:  Discontinued        400 mg 100 mL/hr over 120 Minutes Intravenous Every 24 hours 02/01/21 1732 02/03/21 1528   02/01/21 1800  anidulafungin (ERAXIS)  200 mg in sodium chloride 0.9 % 200 mL IVPB  Status:  Discontinued        200 mg 78 mL/hr over 200 Minutes Intravenous Every 24 hours 02/01/21 1639 02/01/21 1731   02/01/21 1700  ceFEPIme (MAXIPIME) 2 g in sodium chloride 0.9 % 100 mL IVPB  Status:  Discontinued        2 g 200 mL/hr over 30 Minutes Intravenous  Once 02/01/21 1654 02/01/21 1658   02/01/21 1700  vancomycin (VANCOCIN) IVPB 1000 mg/200 mL premix  Status:  Discontinued        1,000 mg 200 mL/hr over 60 Minutes Intravenous  Once 02/01/21 1654 02/01/21 1659   02/01/21 1400  ceFEPIme (MAXIPIME) 2 g in sodium chloride 0.9 % 100 mL IVPB        2 g 200 mL/hr over 30 Minutes Intravenous  Once 02/01/21 1347 02/01/21 1605   02/01/21 1400  vancomycin (VANCOCIN) 2,500 mg in sodium chloride 0.9 % 500 mL IVPB        2,500 mg 250 mL/hr over 120 Minutes Intravenous  Once 02/01/21 1348 02/01/21 1805        Subjective: She denies any nausea vomiting or diarrhea.  Pain in the left leg is decreasing.  Decreased drainage and erythema noted to the left leg wound  Objective: Vitals:   02/05/21 2031 02/06/21 0429 02/06/21 0911 02/06/21 1135  BP: (!) 148/74 124/64 (!) 144/82 111/61  Pulse: 74 64 68 (!) 58  Resp: _0 Temp: 98.4 F (36.9 C) 98.7 F (37.1 C)  97.8 F (36.6 C)  TempSrc: Oral Oral  Oral  SpO2: 94% 100%  96%  Weight:      Height:        Intake/Output Summary (Last 24 hours) at 02/06/2021 1315 Last data filed at 02/06/2021 1142 Gross per 24 hour  Intake 2074.19 ml  Output 2700 ml  Net -625.81 ml    Filed Weights   02/01/21 1409  Weight: (!) 182.3 kg    Examination:  General exam: Appears in distress due to pain Respiratory system: Clear to auscultation. Respiratory effort normal.  Cardiovascular system: S1 & S2 heard, RRR. No JVD, murmurs, rubs, gallops or clicks. No pedal edema. Gastrointestinal system: Abdomen is nondistended, soft and nontender. No organomegaly or masses felt. Normal bowel sounds  heard. Central nervous system: Alert and oriented. No focal neurological deficits. Extremities: Right BKA, left lower extremity with 1+ edema skin: No rashes, lesions or ulcers Psychiatry: Judgement and insight appear normal. Mood & affect appropriate.     Data Reviewed: I have personally reviewed following labs and imaging studies  CBC: Recent Labs  Lab 02/02/21 0116 02/03/21 0340 02/04/21 0551 02/05/21 0448 02/06/21 0525  WBC 11.3* 10.9* 10.3 8.9 9.9  HGB 10.8* 10.6* 10.0* 9.8* 9.9*  HCT 32.3* 32.6* 30.7* 31.8* 32.6*  MCV 85.2 86.7 88.2 91.4 92.1  PLT 231 262 269 294 017    Basic Metabolic Panel: Recent Labs  Lab 02/01/21 1233 02/01/21 1650 02/02/21 0116 02/03/21 0340 02/05/21 0448 02/06/21 0525  NA 126*  --  126* 132* 137 138  K 3.5  --  3.0* 3.4* 3.6 4.3  CL 95*  --  97* 102 104 106  CO2 21*  --  _0 GLUCOSE 517* 524* 468* 227* 108* 119*  BUN 12  --  _1 21*  CREATININE 1.12*  --  1.13* 1.04* 1.18* 1.08*  CALCIUM 8.7*  --  7.9* 8.4* 8.8* 9.0  MG  --  1.9  --   --   --   --     GFR: Estimated Creatinine Clearance: 115 mL/min (A) (by C-G formula based on SCr of 1.08 mg/dL (H)). Liver Function Tests: Recent Labs  Lab 02/01/21 1233 02/02/21 0116 02/03/21 0340  AST 9* 9* 8*  ALT _2 ALKPHOS 81 66 55  BILITOT 0.7 0.8 0.4  PROT 7.5 6.2* 6.4*  ALBUMIN 2.2* 1.8* 1.8*    No results for input(s): LIPASE, AMYLASE in the last 168 hours. No results for input(s): AMMONIA in the last 168 hours. Coagulation Profile: Recent Labs  Lab 02/01/21 1233 02/02/21 0116  INR 1.0 1.1    Cardiac Enzymes: Recent Labs  Lab 02/03/21 0340  CKTOTAL 22*    BNP (last 3 results) No results for input(s): PROBNP in the last 8760 hours. HbA1C: No results for input(s): HGBA1C in the last 72 hours. CBG: Recent Labs  Lab 02/05/21 2027 02/05/21 2340 02/06/21 0424 02/06/21 0803 02/06/21 1133  GLUCAP 230* 150* 136* 97 134*    Lipid Profile: No  results for input(s): CHOL, HDL, LDLCALC, TRIG, CHOLHDL, LDLDIRECT in the last 72 hours. Thyroid Function Tests: No results for input(s): TSH, T4TOTAL, FREET4, T3FREE, THYROIDAB in the last 72 hours. Anemia Panel: No results for input(s): VITAMINB12, FOLATE, FERRITIN, TIBC, IRON, RETICCTPCT in the last 72 hours. Sepsis Labs: Recent Labs  Lab 02/01/21 1301 02/01/21 1501 02/02/21 0116  PROCALCITON  --   --  3.75  LATICACIDVEN 1.2 1.3  --      Recent Results (from the past 240 hour(s))  Urine culture     Status: Abnormal   Collection Time: 02/01/21 12:33 PM   Specimen: In/Out Cath Urine  Result Value Ref Range Status   Specimen Description   Final    IN/OUT CATH URINE Performed at [facility], [place] [address], [place], [place] [address]    Special Requests   Final    NONE Performed at [facility], [place] [address] [place], [place] [address]    Culture >=100,000 COLONIES/mL KLEBSIELLA PNEUMONIAE (  A)  Final   Report Status 02/04/2021 FINAL  Final   Organism ID, Bacteria KLEBSIELLA PNEUMONIAE (A)  Final      Susceptibility   Klebsiella pneumoniae - MIC*    AMPICILLIN >=32 RESISTANT Resistant     CEFAZOLIN <=4 SENSITIVE Sensitive     CEFEPIME <=0.12 SENSITIVE Sensitive     CEFTRIAXONE <=0.25 SENSITIVE Sensitive     CIPROFLOXACIN <=0.25 SENSITIVE Sensitive     GENTAMICIN <=1 SENSITIVE Sensitive     IMIPENEM <=0.25 SENSITIVE Sensitive     NITROFURANTOIN 64 INTERMEDIATE Intermediate     TRIMETH/SULFA <=20 SENSITIVE Sensitive     AMPICILLIN/SULBACTAM 8 SENSITIVE Sensitive     PIP/TAZO <=4 SENSITIVE Sensitive     * >=100,000 COLONIES/mL KLEBSIELLA PNEUMONIAE  Resp Panel by RT-PCR (Flu A&B, Covid) Nasopharyngeal Swab     Status: None   Collection Time: 02/01/21  1:02 PM   Specimen: Nasopharyngeal Swab; Nasopharyngeal(NP) swabs in vial transport medium  Result Value Ref Range Status   SARS Coronavirus 2 by RT PCR NEGATIVE NEGATIVE Final     Comment: (NOTE) SARS-CoV-2 target nucleic acids are NOT DETECTED.  The SARS-CoV-2 RNA is generally detectable in upper respiratory specimens during the acute phase of infection. The lowest concentration of SARS-CoV-2 viral copies this assay can detect is 138 copies/mL. A negative result does not preclude SARS-Cov-2 infection and should not be used as the sole basis for treatment or other patient management decisions. A negative result may occur with  improper specimen collection/handling, submission of specimen other than nasopharyngeal swab, presence of viral mutation(s) within the areas targeted by this assay, and inadequate number of viral copies(<138 copies/mL). A negative result must be combined with clinical observations, patient history, and epidemiological information. The expected result is Negative.  Fact Sheet for Patients:  EntrepreneurPulse.com.au  Fact Sheet for Healthcare Providers:  IncredibleEmployment.be  This test is no t yet approved or cleared by the [place] FDA and  has been authorized for detection and/or diagnosis of SARS-CoV-2 by FDA under an Emergency Use Authorization (EUA). This EUA will remain  in effect (meaning this test can be used) for the duration of the COVID-19 declaration under Section 564(b)(1) of the Act, 21 U.S.C.section 360bbb-3(b)(1), unless the authorization is terminated  or revoked sooner.       Influenza A by PCR NEGATIVE NEGATIVE Final   Influenza B by PCR NEGATIVE NEGATIVE Final    Comment: (NOTE) The Xpert Xpress SARS-CoV-2/FLU/RSV plus assay is intended as an aid in the diagnosis of influenza from Nasopharyngeal swab specimens and should not be used as a sole basis for treatment. Nasal washings and aspirates are unacceptable for Xpert Xpress SARS-CoV-2/FLU/RSV testing.  Fact Sheet for Patients: EntrepreneurPulse.com.au  Fact Sheet for Healthcare  Providers: IncredibleEmployment.be  This test is not yet approved or cleared by the [place] FDA and has been authorized for detection and/or diagnosis of SARS-CoV-2 by FDA under an Emergency Use Authorization (EUA). This EUA will remain in effect (meaning this test can be used) for the duration of the COVID-19 declaration under Section 564(b)(1) of the Act, 21 U.S.C. section 360bbb-3(b)(1), unless the authorization is terminated or revoked.  Performed at [facility], [place] [address], [place], [place] [address]   Blood culture (routine x 2)     Status: Abnormal   Collection Time: 02/01/21  1:40 PM   Specimen: BLOOD  Result Value Ref Range Status   Specimen Description   Final    BLOOD RIGHT ANTECUBITAL Performed  at [facility], [place] [address], [place], [place] [address]    Special Requests   Final    BOTTLES DRAWN AEROBIC AND ANAEROBIC Blood Culture results may not be optimal due to an inadequate volume of blood received in culture bottles Performed at [place] [address], [place], [place] [address]    Culture  Setup Time   Final    GRAM POSITIVE COCCI IN PAIRS IN BOTH AEROBIC AND ANAEROBIC BOTTLES GRAM NEGATIVE RODS AEROBIC BOTTLE ONLY CRITICAL VALUE NOTED.  VALUE IS CONSISTENT WITH PREVIOUSLY REPORTED AND CALLED VALUE.    Culture (A)  Final    ACINETOBACTER CALCOACETICUS/BAUMANNII COMPLEX ENTEROCOCCUS FAECALIS SUSCEPTIBILITIES PERFORMED ON PREVIOUS CULTURE WITHIN THE LAST 5 DAYS. Performed at [facility], [place] [address], [place], [place] [address]    Report Status 02/04/2021 FINAL  Final  Blood culture (routine x 2)     Status: Abnormal   Collection Time: 02/01/21  1:48 PM   Specimen: BLOOD RIGHT FOREARM  Result Value Ref Range Status   Specimen Description   Final    BLOOD RIGHT FOREARM Performed at [place] [address] [place], [place]  [address]    Special Requests   Final    BOTTLES DRAWN AEROBIC AND ANAEROBIC Blood Culture adequate volume Performed at [place] [address], [place], [place] [address]    Culture  Setup Time   Final    GRAM POSITIVE COCCI IN PAIRS IN BOTH AEROBIC AND ANAEROBIC BOTTLES GRAM NEGATIVE RODS AEROBIC BOTTLE ONLY CRITICAL RESULT CALLED TO, READ BACK BY AND VERIFIED WITH: [place] Lah [id number] 02/02/2021 Mena Goes Performed at [facility], [place] [address] [place], [place] [address]    Culture (A)  Final    ACINETOBACTER CALCOACETICUS/BAUMANNII COMPLEX VANCOMYCIN RESISTANT ENTEROCOCCUS    Report Status 02/04/2021 FINAL  Final   Organism ID, Bacteria ACINETOBACTER CALCOACETICUS/BAUMANNII COMPLEX  Final   Organism ID, Bacteria VANCOMYCIN RESISTANT ENTEROCOCCUS  Final      Susceptibility   Acinetobacter calcoaceticus/baumannii complex - MIC*    CEFTAZIDIME 16 INTERMEDIATE Intermediate     CIPROFLOXACIN 1 SENSITIVE Sensitive     GENTAMICIN <=1 SENSITIVE Sensitive     IMIPENEM <=0.25 SENSITIVE Sensitive     PIP/TAZO 32 INTERMEDIATE Intermediate     TRIMETH/SULFA <=20 SENSITIVE Sensitive     AMPICILLIN/SULBACTAM <=2 SENSITIVE Sensitive     * ACINETOBACTER CALCOACETICUS/BAUMANNII COMPLEX   Vancomycin resistant enterococcus - MIC*    AMPICILLIN <=2 SENSITIVE Sensitive     VANCOMYCIN >=32 RESISTANT Resistant     GENTAMICIN SYNERGY SENSITIVE Sensitive     LINEZOLID 2 SENSITIVE Sensitive     * VANCOMYCIN RESISTANT ENTEROCOCCUS  Blood Culture ID Panel (Reflexed)     Status: Abnormal   Collection Time: 02/01/21  1:48 PM  Result Value Ref Range Status   Enterococcus faecalis DETECTED (A) NOT DETECTED Final    Comment: CRITICAL RESULT CALLED TO, READ BACK BY AND VERIFIED WITH: EMariah Milling 0411 02/02/2021 T. TYSOR    Enterococcus Faecium NOT DETECTED NOT DETECTED Final   Listeria monocytogenes NOT DETECTED NOT DETECTED Final   Staphylococcus species NOT  DETECTED NOT DETECTED Final   Staphylococcus aureus (BCID) NOT DETECTED NOT DETECTED Final   Staphylococcus epidermidis NOT DETECTED NOT DETECTED Final   Staphylococcus lugdunensis NOT DETECTED NOT DETECTED Final   Streptococcus species NOT DETECTED NOT DETECTED Final   Streptococcus agalactiae NOT DETECTED NOT DETECTED Final   Streptococcus  pneumoniae NOT DETECTED NOT DETECTED Final   Streptococcus pyogenes NOT DETECTED NOT DETECTED Final   A.calcoaceticus-baumannii DETECTED (A) NOT DETECTED Final    Comment: CRITICAL RESULT CALLED TO, READ BACK BY AND VERIFIED WITH: E. JACKSON,PHARMD 0411 02/02/2021 T. TYSOR    Bacteroides fragilis NOT DETECTED NOT DETECTED Final   Enterobacterales NOT DETECTED NOT DETECTED Final   Enterobacter cloacae complex NOT DETECTED NOT DETECTED Final   Escherichia coli NOT DETECTED NOT DETECTED Final   Klebsiella aerogenes NOT DETECTED NOT DETECTED Final   Klebsiella oxytoca NOT DETECTED NOT DETECTED Final   Klebsiella pneumoniae NOT DETECTED NOT DETECTED Final   Proteus species NOT DETECTED NOT DETECTED Final   Salmonella species NOT DETECTED NOT DETECTED Final   Serratia marcescens NOT DETECTED NOT DETECTED Final   Haemophilus influenzae NOT DETECTED NOT DETECTED Final   Neisseria meningitidis NOT DETECTED NOT DETECTED Final   Pseudomonas aeruginosa NOT DETECTED NOT DETECTED Final   Stenotrophomonas maltophilia NOT DETECTED NOT DETECTED Final   Candida albicans NOT DETECTED NOT DETECTED Final   Candida auris NOT DETECTED NOT DETECTED Final   Candida glabrata NOT DETECTED NOT DETECTED Final   Candida krusei NOT DETECTED NOT DETECTED Final   Candida parapsilosis NOT DETECTED NOT DETECTED Final   Candida tropicalis NOT DETECTED NOT DETECTED Final   Cryptococcus neoformans/gattii NOT DETECTED NOT DETECTED Final   CTX-M ESBL NOT DETECTED NOT DETECTED Final   Carbapenem resistance IMP NOT DETECTED NOT DETECTED Final   Carbapenem resistance KPC NOT DETECTED  NOT DETECTED Final   Carbapenem resistance NDM NOT DETECTED NOT DETECTED Final   Vancomycin resistance DETECTED (A) NOT DETECTED Final    Comment: CRITICAL RESULT CALLED TO, READ BACK BY AND VERIFIED WITH: EMariah Milling [id number] 02/02/2021 T. TYSOR    Carbapenem resistance VIM NOT DETECTED NOT DETECTED Final    Comment: Performed at [facility], [address], [place], [place] [address]  Aerobic/Anaerobic Culture w Gram Stain (surgical/deep wound)     Status: None (Preliminary result)   Collection Time: 02/01/21  1:49 PM   Specimen: Abscess  Result Value Ref Range Status   Specimen Description   Final    ABSCESS LEFT LEG Performed at [place] [address], [place], [place] [address]    Special Requests   Final    Immunocompromised Performed at [facility], [place] [address] [place], [place] [address]    Gram Stain   Final    FEW WBC PRESENT,BOTH PMN AND MONONUCLEAR FEW GRAM NEGATIVE RODS Performed at [facility], [place] [address], [place], [place] [address]    Culture   Final    MODERATE KLEBSIELLA PNEUMONIAE RARE PSEUDOMONAS AERUGINOSA NO ANAEROBES ISOLATED; CULTURE IN PROGRESS FOR 5 DAYS    Report Status PENDING  Incomplete   Organism ID, Bacteria KLEBSIELLA PNEUMONIAE  Final   Organism ID, Bacteria PSEUDOMONAS AERUGINOSA  Final      Susceptibility   Klebsiella pneumoniae - MIC*    AMPICILLIN >=32 RESISTANT Resistant     CEFAZOLIN <=4 SENSITIVE Sensitive     CEFEPIME <=0.12 SENSITIVE Sensitive     CEFTAZIDIME <=1 SENSITIVE Sensitive     CEFTRIAXONE <=0.25 SENSITIVE Sensitive     CIPROFLOXACIN <=0.25 SENSITIVE Sensitive     GENTAMICIN <=1 SENSITIVE Sensitive     IMIPENEM <=0.25 SENSITIVE Sensitive     TRIMETH/SULFA <=20 SENSITIVE Sensitive     AMPICILLIN/SULBACTAM 4 SENSITIVE Sensitive     PIP/TAZO <=4 SENSITIVE Sensitive     *  MODERATE KLEBSIELLA PNEUMONIAE   Pseudomonas aeruginosa - MIC*    CEFTAZIDIME 4  SENSITIVE Sensitive     CIPROFLOXACIN <=0.25 SENSITIVE Sensitive     GENTAMICIN <=1 SENSITIVE Sensitive     IMIPENEM 1 SENSITIVE Sensitive     PIP/TAZO 8 SENSITIVE Sensitive     CEFEPIME 2 SENSITIVE Sensitive     * RARE PSEUDOMONAS AERUGINOSA  Culture, blood (Routine X 2) w Reflex to ID Panel     Status: None (Preliminary result)   Collection Time: 02/03/21  8:43 PM   Specimen: BLOOD LEFT HAND  Result Value Ref Range Status   Specimen Description   Final    BLOOD LEFT HAND Blood Culture adequate volume BOTTLES DRAWN AEROBIC ONLY Performed at [place] [address], [place], [place] [address]    Special Requests   Final    NONE Performed at [facility], [place] [address], [place], [place] [address]    Culture   Final    NO GROWTH 2 DAYS Performed at [place] [address], [place], [place] [address]    Report Status PENDING  Incomplete  Culture, blood (Routine X 2) w Reflex to ID Panel     Status: None (Preliminary result)   Collection Time: 02/03/21  8:43 PM   Specimen: BLOOD RIGHT ARM  Result Value Ref Range Status   Specimen Description   Final    BLOOD RIGHT ARM Blood Culture adequate volume BOTTLES DRAWN AEROBIC ONLY Performed at [facility] [address] [place], [place] [address]    Special Requests   Final    NONE Performed at [facility], [facility] [address], [place], [place] [address]    Culture   Final    NO GROWTH 2 DAYS Performed at [facility] [address], [place], [place] [address]    Report Status PENDING  Incomplete          Radiology Studies: ECHOCARDIOGRAM COMPLETE  Result Date: 02/05/2021    ECHOCARDIOGRAM REPORT   Patient Name:   Karen Bennett Date of Exam: 02/05/2021 Medical Rec #:  [id number]       Height:       66.0 in Accession #:    [id number]      Weight:       401.9 lb Date of Birth:  [birth year]      BSA:          2.690 m Patient Age:     9 years        BP:           139/61 mmHg Patient Gender: F               HR:           65 bpm. Exam Location:  Inpatient Procedure: 2D Echo, Cardiac Doppler and Color Doppler Indications:    Abnormal ECG R94.31  History:        Patient has prior history of Echocardiogram examinations, most                 recent 01/03/2021. Risk Factors:Hypertension, Diabetes and                 Non-Smoker. PE. Infective endocarditis.  Sonographer:    Vickie Epley RDCS Referring Phys: [id number] Noland Fordyce [place] T[place]ick  Sonographer Comments: Patient is morbidly obese. IMPRESSIONS  1. Left ventricular ejection fraction, by estimation, is 50 to 55%. The left  ventricle has low normal function. The left ventricle demonstrates regional wall motion abnormalities (see scoring diagram/findings for description). The left ventricular internal cavity size was moderately dilated. Left ventricular diastolic parameters are consistent with Grade II diastolic dysfunction (pseudonormalization).  2. Right ventricular systolic function was not well visualized. The right ventricular size is not well visualized. There is mildly elevated pulmonary artery systolic pressure. The estimated right ventricular systolic pressure is 80.9 mmHg.  3. The mitral valve is normal in structure. No evidence of mitral valve regurgitation. No evidence of mitral stenosis.  4. Tricuspid valve regurgitation is moderate.  5. The aortic valve is normal in structure. Aortic valve regurgitation is not visualized. No aortic stenosis is present.  6. The inferior vena cava is dilated in size with <50% respiratory variability, suggesting right atrial pressure of 15 mmHg. FINDINGS  Left Ventricle: Left ventricular ejection fraction, by estimation, is 50 to 55%. The left ventricle has low normal function. The left ventricle demonstrates regional wall motion abnormalities. The left ventricular internal cavity size was moderately dilated. There is no left ventricular hypertrophy. Left  ventricular diastolic parameters are consistent with Grade II diastolic dysfunction (pseudonormalization).  LV Wall Scoring: The antero-lateral wall and apical lateral segment are hypokinetic. Right Ventricle: The right ventricular size is not well visualized. No increase in right ventricular wall thickness. Right ventricular systolic function was not well visualized. There is mildly elevated pulmonary artery systolic pressure. The tricuspid regurgitant velocity is 2.48 m/s, and with an assumed right atrial pressure of 15 mmHg, the estimated right ventricular systolic pressure is 98.3 mmHg. Left Atrium: Left atrial size was normal in size. Right Atrium: Right atrial size was normal in size. Pericardium: There is no evidence of pericardial effusion. Mitral Valve: The mitral valve is normal in structure. No evidence of mitral valve regurgitation. No evidence of mitral valve stenosis. Tricuspid Valve: The tricuspid valve is normal in structure. Tricuspid valve regurgitation is moderate . No evidence of tricuspid stenosis. Aortic Valve: The aortic valve is normal in structure. Aortic valve regurgitation is not visualized. No aortic stenosis is present. Pulmonic Valve: The pulmonic valve was normal in structure. Pulmonic valve regurgitation is not visualized. No evidence of pulmonic stenosis. Aorta: The aortic root is normal in size and structure. Venous: The inferior vena cava is dilated in size with less than 50% respiratory variability, suggesting right atrial pressure of 15 mmHg. IAS/Shunts: No atrial level shunt detected by color flow Doppler.  LEFT VENTRICLE PLAX 2D LVIDd:         6.40 cm      Diastology LVIDs:         4.80 cm      LV e' medial:    5.59 cm/s LV PW:         0.80 cm      LV E/e' medial:  17.3 LV IVS:        0.80 cm      LV e' lateral:   7.22 cm/s LVOT diam:     2.10 cm      LV E/e' lateral: 13.4 LV SV:         77 LV SV Index:   29 LVOT Area:     3.46 cm  LV Volumes (MOD) LV vol d, MOD A2C: 161.0 ml  LV vol d, MOD A4C: 200.0 ml LV vol s, MOD A2C: 73.8 ml LV vol s, MOD A4C: 93.3 ml LV SV MOD A2C:     87.2 ml LV SV MOD A4C:  200.0 ml LV SV MOD BP:      98.9 ml RIGHT VENTRICLE RV S prime:     9.32 cm/s TAPSE (M-mode): 1.7 cm LEFT ATRIUM             Index       RIGHT ATRIUM           Index LA diam:        3.90 cm 1.45 cm/m  RA Area:     18.80 cm LA Vol (A2C):   51.9 ml 19.29 ml/m RA Volume:   56.20 ml  20.89 ml/m LA Vol (A4C):   45.3 ml 16.84 ml/m LA Biplane Vol: 48.6 ml 18.06 ml/m  AORTIC VALVE LVOT Vmax:   99.80 cm/s LVOT Vmean:  71.000 cm/s LVOT VTI:    0.222 m  AORTA Ao Root diam: 3.00 cm MITRAL VALVE               TRICUSPID VALVE MV Area (PHT): 3.11 cm    TR Peak grad:   24.6 mmHg MV Decel Time: 244 msec    TR Vmax:        248.00 cm/s MR Peak grad: 100.8 mmHg MR Vmax:      502.00 cm/s  SHUNTS MV E velocity: 96.50 cm/s  Systemic VTI:  0.22 m MV A velocity: 62.10 cm/s  Systemic Diam: 2.10 cm MV E/A ratio:  1.55 Candee Furbish MD Electronically signed by Candee Furbish MD Signature Date/Time: 02/05/2021/11:59:06 AM    Final         Scheduled Meds:  (feeding supplement) PROSource Plus  30 mL Oral BID BM   acetaminophen  650 mg Oral Q6H   buPROPion  300 mg Oral Daily   Chlorhexidine Gluconate Cloth  6 each Topical Daily   enoxaparin (LOVENOX) injection  150 mg Subcutaneous Q12H   fluconazole  400 mg Oral QHS   insulin aspart  0-20 Units Subcutaneous Q4H   insulin aspart  6 Units Subcutaneous TID WC   insulin glargine  60 Units Subcutaneous Daily   lisinopril  2.5 mg Oral Daily   metoprolol tartrate  12.5 mg Oral BID   multivitamin with minerals  1 tablet Oral Daily   pantoprazole  40 mg Oral Daily   Ensure Max Protein  11 oz Oral Daily   traZODone  25 mg Oral QHS   venlafaxine XR  75 mg Oral Daily   Continuous Infusions:  sodium chloride 75 mL/hr at 02/04/21 1642   imipenem-cilastatin 1,000 mg (02/06/21 0926)   promethazine (PHENERGAN) injection (IM or IVPB) 25 mg (02/05/21 0424)      LOS: 5 days    Time spent: 40 minutes    Georgette Shell, MD 02/06/2021, 1:15 PM

## 2021-02-06 NOTE — Progress Notes (Signed)
PICC order received. Dr Daiva Eves ID approves PICC placement 02/07/21 since pt has adequate PIV access at this time.  Per Dr Ashley Royalty progress notes, plan to place PICC Monday.  Marylene Land RN states 2 PIV's are working well at this time.  Order modified to be placed 02/07/21.

## 2021-02-07 DIAGNOSIS — R7881 Bacteremia: Secondary | ICD-10-CM

## 2021-02-07 DIAGNOSIS — A419 Sepsis, unspecified organism: Secondary | ICD-10-CM | POA: Diagnosis not present

## 2021-02-07 LAB — CBC
HCT: 32.7 % — ABNORMAL LOW (ref 36.0–46.0)
Hemoglobin: 9.9 g/dL — ABNORMAL LOW (ref 12.0–15.0)
MCH: 28.3 pg (ref 26.0–34.0)
MCHC: 30.3 g/dL (ref 30.0–36.0)
MCV: 93.4 fL (ref 80.0–100.0)
Platelets: 320 10*3/uL (ref 150–400)
RBC: 3.5 MIL/uL — ABNORMAL LOW (ref 3.87–5.11)
RDW: 14.4 % (ref 11.5–15.5)
WBC: 8.8 10*3/uL (ref 4.0–10.5)
nRBC: 0 % (ref 0.0–0.2)

## 2021-02-07 LAB — AEROBIC/ANAEROBIC CULTURE W GRAM STAIN (SURGICAL/DEEP WOUND)

## 2021-02-07 LAB — GLUCOSE, CAPILLARY
Glucose-Capillary: 123 mg/dL — ABNORMAL HIGH (ref 70–99)
Glucose-Capillary: 98 mg/dL (ref 70–99)
Glucose-Capillary: 132 mg/dL — ABNORMAL HIGH (ref 70–99)
Glucose-Capillary: 150 mg/dL — ABNORMAL HIGH (ref 70–99)
Glucose-Capillary: 124 mg/dL — ABNORMAL HIGH (ref 70–99)

## 2021-02-07 LAB — BASIC METABOLIC PANEL
Anion gap: 6 (ref 5–15)
BUN: 19 mg/dL (ref 6–20)
CO2: 23 mmol/L (ref 22–32)
Calcium: 8.6 mg/dL — ABNORMAL LOW (ref 8.9–10.3)
Chloride: 106 mmol/L (ref 98–111)
Creatinine, Ser: 1.07 mg/dL — ABNORMAL HIGH (ref 0.44–1.00)
GFR, Estimated: >60 mL/min (ref >60)
Glucose, Bld: 128 mg/dL — ABNORMAL HIGH (ref 70–99)
Potassium: 4.6 mmol/L (ref 3.5–5.1)
Sodium: 135 mmol/L (ref 135–145)

## 2021-02-07 NOTE — Progress Notes (Signed)
Spoke with primary RN about PICC placement. IV team has attempted bedside placement and is unable to get catheter to pass centrally. Recommendations is to have IR place a line for this patient. Secure chat sent to Dr. Jerolyn Center as well to inform her of this situation.

## 2021-02-07 NOTE — Plan of Care (Signed)
  Problem: Clinical Measurements: Goal: Will remain free from infection Outcome: Progressing Goal: Diagnostic test results will improve Outcome: Progressing   Problem: Health Behavior/Discharge Planning: Goal: Ability to manage health-related needs will improve Outcome: Not Progressing   Problem: Activity: Goal: Risk for activity intolerance will decrease Outcome: Not Progressing

## 2021-02-07 NOTE — Progress Notes (Signed)
PROGRESS NOTE    Karen Bennett  TGG:[id number] DOB: 12-10-1975 DOA: 02/01/2021 PCP: Pablo Lawrence, NP   Brief Narrative: 45 y.o. female with medical history significant of type 2 diabetes mellitus, prior history of DVT/PE, right BKA, super morbid obesity, fungemia, endocarditis who was hospitalized from 12/26/2020 through 01/05/2021 for left lateral thigh abscess and during this hospitalization she was also diagnosed with Candida albicans fungemia/mitral valve endocarditis and she was discharged home on IV micafungin for 6 weeks.  She completed 9 days of IV antibiotics for left thigh abscess which grew Pseudomonas.  She comes to the ER today for advice from the home health nurse which found out that her blood sugar was more than 500.  Although patient herself did not have any complaints.  However having said that, she tells me that she has been having intermittent nausea and vomiting at home since she has been started on antifungals at home.  Initially she claimed that she has not been able to eat or drink anything at home.  When I brought up the fact that she does not look dehydrated for a person who has not had anything to eat for a month.  She felt offended and changed her statement that she has intermittent symptoms and not all the time.  Her friend/neighbor is at the bedside who has been talking for the patient for most part and unfortunately continues to interrupt me from interviewing the patient.  For some reason, they seem to be hostile against medical community and do not seem to be happy with the care they have received so far.  Regarding her, she was not having any chills, sweating or fever at home.   ED Course: Upon arrival to the ED, her temperature was 100.5, she was tachycardic and tachypneic.  Slightly elevated blood pressure as well.  Mild leukocytosis 10.9.  Yet once again hyponatremia with sodium of 126.  CKD stage IIIa at baseline.  Blood sugar 517 but anion gap normal.  No DKA.   Lactic acid normal.  She is not vaccinated for COVID, however COVID test is negative here.  She was already seen by theGeneral surgery in the ED and they evacuated small pocket of purulent drainage from the left lateral thigh.  Cultures were sent.  They recommended dressing changes with Dakin's x72 hours.  Recommended CT of the left thigh which ruled out abscess.  ED physician also discussed the case with Dr. Graylon Good of ID and per her recommendations, patient was started on cefepime and Rocephin.  Hospital service was then consulted to admit the patient for further management.  CT angiogram was also done which once again showed left-sided PE which is chronic.  Patient was started on heparin drip as well.    Assessment & Plan:   Active Problems:   DM (diabetes mellitus), type 2, uncontrolled with complications (HCC)   Pulmonary emboli (HCC)   Anxiety and depression   HTN (hypertension)   Infective endocarditis   Sepsis ([place])  #1 sepsis present on admission secondary to left thigh abscess growing Klebsiella and Pseudomonas/VRE and acinetobacter bacteremia.  She met criteria for sepsis at the time of admission with tachypnea tachycardia fever and leukocytosis.  She was being treated as an outpatient for fungal mitral valve endocarditis and had a PICC line on admission.  It was thought she had PICC line related bloodstream infection with VRE and Acinetobacter.  PICC line was removed on 02/03/2021.  Repeat blood cultures were done after the PICC line was  removed which are negative so far.  She is status post incision and drainage of the left thigh abscess with Dakin's solution dressings to be done for daily  Klebsiella and pseudomonas  from left leg culture, VRE and Acinetobacter from blood culture  Followed by ID and general surgery. Was being treated for fungal endocarditis as an outpatient PICC line dcd 7/14 repeat culture negative plan for PICC line by IR   ON IMIPENEM SINCE 7/15 TO COVER  ALL-Klebsiella Pseudomonas Acinetobacter VRE  #2 Candida mitral valve endocarditis- currently on Diflucan p.o. since she have only limited IV access.  She was not able to tolerate caspofungin  as an outpatient.  #3 uncontrolled type 2 diabetes with hyperglycemia-CBG improved since increasing Lantus to 60 units and NovoLog to 6 units 3 times a day.   CBG (last 3)  Recent Labs    02/07/21 0412 02/07/21 0750 02/07/21 1213  GLUCAP 123* 98 132*    #4 CKD stage IIIa at baseline  #5 history of essential hypertension on lisinopril and Lopressor.  #6 anxiety and depression on Wellbutrin and Effexor  #7 hyperlipidemia continue statin  #8 hypokalemia repleted  #9 chronic pulmonary embolism -on Lovenox for now restart Xarelto once procedures are done.  She still has to get a PICC line.    #10 pseudo hyponatremia due to hyperglycemia resolved.follow up labs in am   Pressure Injury 12/27/20 Buttocks Right;Left Stage 2 -  Partial thickness loss of dermis presenting as a shallow open injury with a red, pink wound bed without slough. (Active)  12/27/20 1200  Location: Buttocks  Location Orientation: Right;Left  Staging: Stage 2 -  Partial thickness loss of dermis presenting as a shallow open injury with a red, pink wound bed without slough.  Wound Description (Comments):   Present on Admission:      Estimated body mass index is 64.87 kg/m as calculated from the following:   Height as of this encounter: $RemoveBeforeD'5\' 6"'[id number]$  (1.676 m).   Weight as of this encounter: 182.3 kg.  DVT prophylaxis: IV heparin Code Status: Full code Family Communication: None at bedside Disposition Plan: Home  Remains inpatient appropriate because:IV treatments appropriate due to intensity of illness or inability to take PO  Dispo: The patient is from: Home              Anticipated d/c is to: Home              Patient currently is not medically stable to d/c.   Difficult to place patient No    Consultants: General  surgery and infectious disease  Procedures: Incision and drainage of the left thigh abscess 02/02/2021 Antimicrobials: Anti-infectives (From admission, onward)    Start     Dose/Rate Route Frequency Ordered Stop   02/04/21 1800  imipenem-cilastatin (PRIMAXIN) 1,000 mg in sodium chloride 0.9 % 250 mL IVPB        1,000 mg 250 mL/hr over 60 Minutes Intravenous Every 8 hours 02/04/21 1452     02/03/21 2200  fluconazole (DIFLUCAN) tablet 400 mg        400 mg Oral Daily at bedtime 02/03/21 1528     02/03/21 2200  linezolid (ZYVOX) tablet 600 mg  Status:  Discontinued        600 mg Oral Every 12 hours 02/03/21 1528 02/04/21 1452   02/02/21 2000  DAPTOmycin (CUBICIN) 1,200 mg in sodium chloride 0.9 % IVPB  Status:  Discontinued        1,200 mg 148 mL/hr  over 30 Minutes Intravenous Daily 02/02/21 0805 02/03/21 1528   02/02/21 0530  DAPTOmycin (CUBICIN) 850 mg in sodium chloride 0.9 % IVPB        8 mg/kg  108.5 kg (Adjusted) 134 mL/hr over 30 Minutes Intravenous  Once 02/02/21 0431 02/02/21 0611   02/02/21 0530  Ampicillin-Sulbactam (UNASYN) 3 g in sodium chloride 0.9 % 100 mL IVPB  Status:  Discontinued        3 g 200 mL/hr over 30 Minutes Intravenous Every 6 hours 02/02/21 0431 02/04/21 1452   02/02/21 0500  Vancomycin (VANCOCIN) 1,250 mg in sodium chloride 0.9 % 250 mL IVPB  Status:  Discontinued        1,250 mg 166.7 mL/hr over 90 Minutes Intravenous Every 12 hours 02/01/21 1647 02/02/21 0431   02/01/21 2200  ceFEPIme (MAXIPIME) 2 g in sodium chloride 0.9 % 100 mL IVPB  Status:  Discontinued        2 g 200 mL/hr over 30 Minutes Intravenous Every 8 hours 02/01/21 1639 02/02/21 0431   02/01/21 2200  fluconazole (DIFLUCAN) IVPB 400 mg  Status:  Discontinued        400 mg 100 mL/hr over 120 Minutes Intravenous Every 24 hours 02/01/21 1732 02/03/21 1528   02/01/21 1800  anidulafungin (ERAXIS) 200 mg in sodium chloride 0.9 % 200 mL IVPB  Status:  Discontinued        200 mg 78 mL/hr over 200  Minutes Intravenous Every 24 hours 02/01/21 1639 02/01/21 1731   02/01/21 1700  ceFEPIme (MAXIPIME) 2 g in sodium chloride 0.9 % 100 mL IVPB  Status:  Discontinued        2 g 200 mL/hr over 30 Minutes Intravenous  Once 02/01/21 1654 02/01/21 1658   02/01/21 1700  vancomycin (VANCOCIN) IVPB 1000 mg/200 mL premix  Status:  Discontinued        1,000 mg 200 mL/hr over 60 Minutes Intravenous  Once 02/01/21 1654 02/01/21 1659   02/01/21 1400  ceFEPIme (MAXIPIME) 2 g in sodium chloride 0.9 % 100 mL IVPB        2 g 200 mL/hr over 30 Minutes Intravenous  Once 02/01/21 1347 02/01/21 1605   02/01/21 1400  vancomycin (VANCOCIN) 2,500 mg in sodium chloride 0.9 % 500 mL IVPB        2,500 mg 250 mL/hr over 120 Minutes Intravenous  Once 02/01/21 1348 02/01/21 1805        Subjective:   She is resting in bed denies any nausea vomiting or diarrhea anxious to go home Hoping to get PICC line in today Objective: Vitals:   02/06/21 1135 02/06/21 2002 02/07/21 0448 02/07/21 0912  BP: 111/61 140/68 (!) 151/71 (!) 146/70  Pulse: (!) 58 63 (!) 59 62  Resp: $Remo'16 18 18   'nFYDL$ Temp: 97.8 F (36.6 C) 97.6 F (36.4 C) 97.9 F (36.6 C)   TempSrc: Oral Oral Oral   SpO2: 96% 98% 98%   Weight:      Height:        Intake/Output Summary (Last 24 hours) at 02/07/2021 1234 Last data filed at 02/07/2021 0600 Gross per 24 hour  Intake 951.31 ml  Output 1100 ml  Net -148.69 ml    Filed Weights   02/01/21 1409  Weight: (!) 182.3 kg    Examination:  General exam: Appears in distress due to pain Respiratory system: Clear to auscultation. Respiratory effort normal. Cardiovascular system: S1 & S2 heard, RRR. No JVD, murmurs, rubs, gallops or clicks.  No pedal edema. Gastrointestinal system: Abdomen is nondistended, soft and nontender. No organomegaly or masses felt. Normal bowel sounds heard. Central nervous system: Alert and oriented. No focal neurological deficits. Extremities: Right BKA, left lower extremity with  1+ edema skin: No rashes, lesions or ulcers Psychiatry: Judgement and insight appear normal. Mood & affect appropriate.     Data Reviewed: I have personally reviewed following labs and imaging studies  CBC: Recent Labs  Lab 02/03/21 0340 02/04/21 0551 02/05/21 0448 02/06/21 0525 02/07/21 0414  WBC 10.9* 10.3 8.9 9.9 8.8  HGB 10.6* 10.0* 9.8* 9.9* 9.9*  HCT 32.6* 30.7* 31.8* 32.6* 32.7*  MCV 86.7 88.2 91.4 92.1 93.4  PLT 262 269 294 349 978    Basic Metabolic Panel: Recent Labs  Lab 02/01/21 1650 02/02/21 0116 02/03/21 0340 02/05/21 0448 02/06/21 0525 02/07/21 0414  NA  --  126* 132* 137 138 135  K  --  3.0* 3.4* 3.6 4.3 4.6  CL  --  97* 102 104 106 106  CO2  --  $R'23 24 23 25 23  'fq$ GLUCOSE 524* 468* 227* 108* 119* 128*  BUN  --  $R'14 16 20 'Nl$ 21* 19  CREATININE  --  1.13* 1.04* 1.18* 1.08* 1.07*  CALCIUM  --  7.9* 8.4* 8.8* 9.0 8.6*  MG 1.9  --   --   --   --   --     GFR: Estimated Creatinine Clearance: 116.1 mL/min (A) (by C-G formula based on SCr of 1.07 mg/dL (H)). Liver Function Tests: Recent Labs  Lab 02/01/21 1233 02/02/21 0116 02/03/21 0340  AST 9* 9* 8*  ALT $Re'9 8 7  'Sqr$ ALKPHOS 81 66 55  BILITOT 0.7 0.8 0.4  PROT 7.5 6.2* 6.4*  ALBUMIN 2.2* 1.8* 1.8*    No results for input(s): LIPASE, AMYLASE in the last 168 hours. No results for input(s): AMMONIA in the last 168 hours. Coagulation Profile: Recent Labs  Lab 02/01/21 1233 02/02/21 0116  INR 1.0 1.1    Cardiac Enzymes: Recent Labs  Lab 02/03/21 0340  CKTOTAL 22*    BNP (last 3 results) No results for input(s): PROBNP in the last 8760 hours. HbA1C: No results for input(s): HGBA1C in the last 72 hours. CBG: Recent Labs  Lab 02/06/21 1959 02/06/21 2357 02/07/21 0412 02/07/21 0750 02/07/21 1213  GLUCAP 136* 86 123* 98 132*    Lipid Profile: No results for input(s): CHOL, HDL, LDLCALC, TRIG, CHOLHDL, LDLDIRECT in the last 72 hours. Thyroid Function Tests: No results for input(s): TSH,  T4TOTAL, FREET4, T3FREE, THYROIDAB in the last 72 hours. Anemia Panel: No results for input(s): VITAMINB12, FOLATE, FERRITIN, TIBC, IRON, RETICCTPCT in the last 72 hours. Sepsis Labs: Recent Labs  Lab 02/01/21 1301 02/01/21 1501 02/02/21 0116  PROCALCITON  --   --  3.75  LATICACIDVEN 1.2 1.3  --      Recent Results (from the past 240 hour(s))  Urine culture     Status: Abnormal   Collection Time: 02/01/21 12:33 PM   Specimen: In/Out Cath Urine  Result Value Ref Range Status   Specimen Description   Final    IN/OUT CATH URINE Performed at [facility], [place] [address], [place], [place] [address]    Special Requests   Final    NONE Performed at [facility], [place] [address] [place], [place] [address]    Culture >=100,000 COLONIES/mL KLEBSIELLA PNEUMONIAE (A)  Final   Report Status 02/04/2021 FINAL  Final   Organism  ID, Bacteria KLEBSIELLA PNEUMONIAE (A)  Final      Susceptibility   Klebsiella pneumoniae - MIC*    AMPICILLIN >=32 RESISTANT Resistant     CEFAZOLIN <=4 SENSITIVE Sensitive     CEFEPIME <=0.12 SENSITIVE Sensitive     CEFTRIAXONE <=0.25 SENSITIVE Sensitive     CIPROFLOXACIN <=0.25 SENSITIVE Sensitive     GENTAMICIN <=1 SENSITIVE Sensitive     IMIPENEM <=0.25 SENSITIVE Sensitive     NITROFURANTOIN 64 INTERMEDIATE Intermediate     TRIMETH/SULFA <=20 SENSITIVE Sensitive     AMPICILLIN/SULBACTAM 8 SENSITIVE Sensitive     PIP/TAZO <=4 SENSITIVE Sensitive     * >=100,000 COLONIES/mL KLEBSIELLA PNEUMONIAE  Resp Panel by RT-PCR (Flu A&B, Covid) Nasopharyngeal Swab     Status: None   Collection Time: 02/01/21  1:02 PM   Specimen: Nasopharyngeal Swab; Nasopharyngeal(NP) swabs in vial transport medium  Result Value Ref Range Status   SARS Coronavirus 2 by RT PCR NEGATIVE NEGATIVE Final    Comment: (NOTE) SARS-CoV-2 target nucleic acids are NOT DETECTED.  The SARS-CoV-2 RNA is generally detectable in upper respiratory specimens  during the acute phase of infection. The lowest concentration of SARS-CoV-2 viral copies this assay can detect is 138 copies/mL. A negative result does not preclude SARS-Cov-2 infection and should not be used as the sole basis for treatment or other patient management decisions. A negative result may occur with  improper specimen collection/handling, submission of specimen other than nasopharyngeal swab, presence of viral mutation(s) within the areas targeted by this assay, and inadequate number of viral copies(<138 copies/mL). A negative result must be combined with clinical observations, patient history, and epidemiological information. The expected result is Negative.  Fact Sheet for Patients:  BloggerCourse.com  Fact Sheet for Healthcare Providers:  SeriousBroker.it  This test is no t yet approved or cleared by the [place] FDA and  has been authorized for detection and/or diagnosis of SARS-CoV-2 by FDA under an Emergency Use Authorization (EUA). This EUA will remain  in effect (meaning this test can be used) for the duration of the COVID-19 declaration under Section 564(b)(1) of the Act, 21 U.S.C.section 360bbb-3(b)(1), unless the authorization is terminated  or revoked sooner.       Influenza A by PCR NEGATIVE NEGATIVE Final   Influenza B by PCR NEGATIVE NEGATIVE Final    Comment: (NOTE) The Xpert Xpress SARS-CoV-2/FLU/RSV plus assay is intended as an aid in the diagnosis of influenza from Nasopharyngeal swab specimens and should not be used as a sole basis for treatment. Nasal washings and aspirates are unacceptable for Xpert Xpress SARS-CoV-2/FLU/RSV testing.  Fact Sheet for Patients: BloggerCourse.com  Fact Sheet for Healthcare Providers: SeriousBroker.it  This test is not yet approved or cleared by the [place] FDA and has been authorized for detection  and/or diagnosis of SARS-CoV-2 by FDA under an Emergency Use Authorization (EUA). This EUA will remain in effect (meaning this test can be used) for the duration of the COVID-19 declaration under Section 564(b)(1) of the Act, 21 U.S.C. section 360bbb-3(b)(1), unless the authorization is terminated or revoked.  Performed at [facility], [address], [place], [place] [address]   Blood culture (routine x 2)     Status: Abnormal   Collection Time: 02/01/21  1:40 PM   Specimen: BLOOD  Result Value Ref Range Status   Specimen Description   Final    BLOOD RIGHT ANTECUBITAL Performed at [facility], [address] [place], [place] [address]  Special Requests   Final    BOTTLES DRAWN AEROBIC AND ANAEROBIC Blood Culture results may not be optimal due to an inadequate volume of blood received in culture bottles Performed at  [address] [place], [place] [address]    Culture  Setup Time   Final    GRAM POSITIVE COCCI IN PAIRS IN BOTH AEROBIC AND ANAEROBIC BOTTLES GRAM NEGATIVE RODS AEROBIC BOTTLE ONLY CRITICAL VALUE NOTED.  VALUE IS CONSISTENT WITH PREVIOUSLY REPORTED AND CALLED VALUE.    Culture (A)  Final    ACINETOBACTER CALCOACETICUS/BAUMANNII COMPLEX ENTEROCOCCUS FAECALIS SUSCEPTIBILITIES PERFORMED ON PREVIOUS CULTURE WITHIN THE LAST 5 DAYS. Performed at [facility], [place] [address] [place], [place] [address]    Report Status 02/04/2021 FINAL  Final  Blood culture (routine x 2)     Status: Abnormal   Collection Time: 02/01/21  1:48 PM   Specimen: BLOOD RIGHT FOREARM  Result Value Ref Range Status   Specimen Description   Final    BLOOD RIGHT FOREARM Performed at [place] [address] [place], [place] [address]    Special Requests   Final    BOTTLES DRAWN AEROBIC AND ANAEROBIC Blood Culture adequate volume Performed at [place]  [address] [place], [place] [address]    Culture  Setup Time   Final    GRAM POSITIVE COCCI IN PAIRS IN BOTH AEROBIC AND ANAEROBIC BOTTLES GRAM NEGATIVE RODS AEROBIC BOTTLE ONLY CRITICAL RESULT CALLED TO, READ BACK BY AND VERIFIED WITH: Gustavo Lah [id number] 02/02/2021 Mena Goes Performed at [facility], [place] [address], [place], [place] [address]    Culture (A)  Final    ACINETOBACTER CALCOACETICUS/BAUMANNII COMPLEX VANCOMYCIN RESISTANT ENTEROCOCCUS    Report Status 02/04/2021 FINAL  Final   Organism ID, Bacteria ACINETOBACTER CALCOACETICUS/BAUMANNII COMPLEX  Final   Organism ID, Bacteria VANCOMYCIN RESISTANT ENTEROCOCCUS  Final      Susceptibility   Acinetobacter calcoaceticus/baumannii complex - MIC*    CEFTAZIDIME 16 INTERMEDIATE Intermediate     CIPROFLOXACIN 1 SENSITIVE Sensitive     GENTAMICIN <=1 SENSITIVE Sensitive     IMIPENEM <=0.25 SENSITIVE Sensitive     PIP/TAZO 32 INTERMEDIATE Intermediate     TRIMETH/SULFA <=20 SENSITIVE Sensitive     AMPICILLIN/SULBACTAM <=2 SENSITIVE Sensitive     * ACINETOBACTER CALCOACETICUS/BAUMANNII COMPLEX   Vancomycin resistant enterococcus - MIC*    AMPICILLIN <=2 SENSITIVE Sensitive     VANCOMYCIN >=32 RESISTANT Resistant     GENTAMICIN SYNERGY SENSITIVE Sensitive     LINEZOLID 2 SENSITIVE Sensitive     * VANCOMYCIN RESISTANT ENTEROCOCCUS  Blood Culture ID Panel (Reflexed)     Status: Abnormal   Collection Time: 02/01/21  1:48 PM  Result Value Ref Range Status   Enterococcus faecalis DETECTED (A) NOT DETECTED Final    Comment: CRITICAL RESULT CALLED TO, READ BACK BY AND VERIFIED WITH: EMariah Milling 0411 02/02/2021 T. TYSOR    Enterococcus Faecium NOT DETECTED NOT DETECTED Final   Listeria monocytogenes NOT DETECTED NOT DETECTED Final   Staphylococcus species NOT DETECTED NOT DETECTED Final   Staphylococcus aureus (BCID) NOT DETECTED NOT DETECTED Final   Staphylococcus epidermidis NOT DETECTED NOT DETECTED Final    Staphylococcus lugdunensis NOT DETECTED NOT DETECTED Final   Streptococcus species NOT DETECTED NOT DETECTED Final   Streptococcus agalactiae NOT DETECTED NOT DETECTED Final   Streptococcus pneumoniae NOT DETECTED NOT DETECTED Final   Streptococcus pyogenes NOT DETECTED NOT DETECTED Final  A.calcoaceticus-baumannii DETECTED (A) NOT DETECTED Final    Comment: CRITICAL RESULT CALLED TO, READ BACK BY AND VERIFIED WITH: E. JACKSON,PHARMD 0411 02/02/2021 T. TYSOR    Bacteroides fragilis NOT DETECTED NOT DETECTED Final   Enterobacterales NOT DETECTED NOT DETECTED Final   Enterobacter cloacae complex NOT DETECTED NOT DETECTED Final   Escherichia coli NOT DETECTED NOT DETECTED Final   Klebsiella aerogenes NOT DETECTED NOT DETECTED Final   Klebsiella oxytoca NOT DETECTED NOT DETECTED Final   Klebsiella pneumoniae NOT DETECTED NOT DETECTED Final   Proteus species NOT DETECTED NOT DETECTED Final   Salmonella species NOT DETECTED NOT DETECTED Final   Serratia marcescens NOT DETECTED NOT DETECTED Final   Haemophilus influenzae NOT DETECTED NOT DETECTED Final   Neisseria meningitidis NOT DETECTED NOT DETECTED Final   Pseudomonas aeruginosa NOT DETECTED NOT DETECTED Final   Stenotrophomonas maltophilia NOT DETECTED NOT DETECTED Final   Candida albicans NOT DETECTED NOT DETECTED Final   Candida auris NOT DETECTED NOT DETECTED Final   Candida glabrata NOT DETECTED NOT DETECTED Final   Candida krusei NOT DETECTED NOT DETECTED Final   Candida parapsilosis NOT DETECTED NOT DETECTED Final   Candida tropicalis NOT DETECTED NOT DETECTED Final   Cryptococcus neoformans/gattii NOT DETECTED NOT DETECTED Final   CTX-M ESBL NOT DETECTED NOT DETECTED Final   Carbapenem resistance IMP NOT DETECTED NOT DETECTED Final   Carbapenem resistance KPC NOT DETECTED NOT DETECTED Final   Carbapenem resistance NDM NOT DETECTED NOT DETECTED Final   Vancomycin resistance DETECTED (A) NOT DETECTED Final    Comment: CRITICAL  RESULT CALLED TO, READ BACK BY AND VERIFIED WITH: EMariah Milling 2202 02/02/2021 T. TYSOR    Carbapenem resistance VIM NOT DETECTED NOT DETECTED Final    Comment: Performed at [facility], [address], [place], [place] [address]  Aerobic/Anaerobic Culture w Gram Stain (surgical/deep wound)     Status: None (Preliminary result)   Collection Time: 02/01/21  1:49 PM   Specimen: Abscess  Result Value Ref Range Status   Specimen Description   Final    ABSCESS LEFT LEG Performed at [place] [address], [place], [place] [address]    Special Requests   Final    Immunocompromised Performed at [facility], [place] [address], [place], [place] [address]    Gram Stain   Final    FEW WBC PRESENT,BOTH PMN AND MONONUCLEAR FEW GRAM NEGATIVE RODS Performed at [facility], [place] [address] [place], [place] [address]    Culture   Final    MODERATE KLEBSIELLA PNEUMONIAE RARE PSEUDOMONAS AERUGINOSA NO ANAEROBES ISOLATED; CULTURE IN PROGRESS FOR 5 DAYS    Report Status PENDING  Incomplete   Organism ID, Bacteria KLEBSIELLA PNEUMONIAE  Final   Organism ID, Bacteria PSEUDOMONAS AERUGINOSA  Final      Susceptibility   Klebsiella pneumoniae - MIC*    AMPICILLIN >=32 RESISTANT Resistant     CEFAZOLIN <=4 SENSITIVE Sensitive     CEFEPIME <=0.12 SENSITIVE Sensitive     CEFTAZIDIME <=1 SENSITIVE Sensitive     CEFTRIAXONE <=0.25 SENSITIVE Sensitive     CIPROFLOXACIN <=0.25 SENSITIVE Sensitive     GENTAMICIN <=1 SENSITIVE Sensitive     IMIPENEM <=0.25 SENSITIVE Sensitive     TRIMETH/SULFA <=20 SENSITIVE Sensitive     AMPICILLIN/SULBACTAM 4 SENSITIVE Sensitive     PIP/TAZO <=4 SENSITIVE Sensitive     * MODERATE KLEBSIELLA PNEUMONIAE   Pseudomonas aeruginosa - MIC*    CEFTAZIDIME  4 SENSITIVE Sensitive     CIPROFLOXACIN <=0.25 SENSITIVE Sensitive     GENTAMICIN <=1 SENSITIVE Sensitive     IMIPENEM 1 SENSITIVE Sensitive     PIP/TAZO 8  SENSITIVE Sensitive     CEFEPIME 2 SENSITIVE Sensitive     * RARE PSEUDOMONAS AERUGINOSA  Culture, blood (Routine X 2) w Reflex to ID Panel     Status: None (Preliminary result)   Collection Time: 02/03/21  8:43 PM   Specimen: BLOOD LEFT HAND  Result Value Ref Range Status   Specimen Description   Final    BLOOD LEFT HAND Blood Culture adequate volume BOTTLES DRAWN AEROBIC ONLY Performed at [place] [address] [place], [place] [address]    Special Requests   Final    NONE Performed at [facility], [place] [address], [place], [place] [address]    Culture   Final    NO GROWTH 4 DAYS Performed at [facility], [place] [address] [place], [place] [address]    Report Status PENDING  Incomplete  Culture, blood (Routine X 2) w Reflex to ID Panel     Status: None (Preliminary result)   Collection Time: 02/03/21  8:43 PM   Specimen: BLOOD RIGHT ARM  Result Value Ref Range Status   Specimen Description   Final    BLOOD RIGHT ARM Blood Culture adequate volume BOTTLES DRAWN AEROBIC ONLY Performed at [place] [address] [address] [place], [place] [address]    Special Requests   Final    NONE Performed at [facility], [place] [address] [place], [place] [address]    Culture   Final    NO GROWTH 4 DAYS Performed at [facility], [place] [address] [place], [place] [address]    Report Status PENDING  Incomplete          Radiology Studies: [place] EKG SITE RITE  Result Date: 02/06/2021 If Site Rite image not attached, placement could not be confirmed due to current cardiac rhythm.       Scheduled Meds:  (feeding supplement) PROSource Plus  30 mL Oral BID BM   acetaminophen  650 mg Oral Q6H   buPROPion  300 mg Oral Daily   Chlorhexidine Gluconate Cloth  6 each Topical Daily   enoxaparin (LOVENOX) injection  150 mg Subcutaneous Q12H   fluconazole  400 mg Oral QHS   insulin aspart  0-20 Units  Subcutaneous Q4H   insulin aspart  6 Units Subcutaneous TID WC   insulin glargine  60 Units Subcutaneous Daily   lisinopril  2.5 mg Oral Daily   metoprolol tartrate  12.5 mg Oral BID   multivitamin with minerals  1 tablet Oral Daily   pantoprazole  40 mg Oral Daily   Ensure Max Protein  11 oz Oral Daily   traZODone  25 mg Oral QHS   venlafaxine XR  75 mg Oral Daily   Continuous Infusions:  imipenem-cilastatin 1,000 mg (02/07/21 0911)   promethazine (PHENERGAN) injection (IM or IVPB) 25 mg (02/05/21 0424)     LOS: 6 days    Time spent: 40 minutes    Georgette Shell, MD 02/07/2021, 12:34 PM

## 2021-02-07 NOTE — Care Management Important Message (Signed)
Important Message  Patient Details IM Letter given to the Patient. Name: Karen Bennett MRN: [id number] Date of Birth: May 20, 1976   Medicare Important Message Given:  Yes     [name] 02/07/2021, 12:10 PM

## 2021-02-07 NOTE — Progress Notes (Signed)
PHARMACY CONSULT NOTE FOR:  OUTPATIENT  PARENTERAL ANTIBIOTIC THERAPY (OPAT)  Indication: VRE/acinetobacter bacteremia Regimen: Imipenem/cilastatin 1000 mg every 8 hours  End date: 02/17/21  IV antibiotic discharge orders are pended. To discharging provider:  please sign these orders via discharge navigator,  Select New Orders & click on the button choice - Manage This Unsigned Work.     Thank you for allowing pharmacy to be a part of this patient's care.  Sharin Mons, PharmD, BCPS, BCIDP Infectious Diseases Clinical Pharmacist Phone: [phone] 02/07/2021, 3:25 PM

## 2021-02-07 NOTE — Progress Notes (Signed)
[facility]   Reason for visit: Follow up on bacteremia  Interval History: repeat blood cultures 7/14 remains ngtd.  Remains afebrile, WBC wnl.  No nausea.  No associated rash.    Physical Exam: Constitutional:  Vitals:   02/07/21 0912 02/07/21 1310  BP: (!) 146/70 (!) 142/65  Pulse: 62 (!) 57  Resp:  20  Temp:  97.9 F (36.6 C)  SpO2:  100%   patient appears in NAD Respiratory: Normal respiratory effort; CTA B Cardiovascular: RRR GI: soft, nt, nd  Review of Systems: Constitutional: negative for fevers and chills Gastrointestinal: negative for nausea Integument/breast: negative for rash  Lab Results  Component Value Date   WBC 8.8 02/07/2021   HGB 9.9 (L) 02/07/2021   HCT 32.7 (L) 02/07/2021   MCV 93.4 02/07/2021   PLT 320 02/07/2021    Lab Results  Component Value Date   CREATININE 1.07 (H) 02/07/2021   BUN 19 02/07/2021   NA 135 02/07/2021   K 4.6 02/07/2021   CL 106 02/07/2021   CO2 23 02/07/2021    Lab Results  Component Value Date   ALT 7 02/03/2021   AST 8 (L) 02/03/2021   ALKPHOS 55 02/03/2021     Microbiology: Recent Results (from the past 240 hour(s))  Urine culture     Status: Abnormal   Collection Time: 02/01/21 12:33 PM   Specimen: In/Out Cath Urine  Result Value Ref Range Status   Specimen Description   Final    IN/OUT CATH URINE Performed at [facility], [address], [place], [place] [address]    Special Requests   Final    NONE Performed at [facility], [address] [place], [place] [address]    Culture >=100,000 COLONIES/mL KLEBSIELLA PNEUMONIAE (A)  Final   Report Status 02/04/2021 FINAL  Final   Organism ID, Bacteria KLEBSIELLA PNEUMONIAE (A)  Final      Susceptibility   Klebsiella pneumoniae - MIC*    AMPICILLIN >=32 RESISTANT Resistant     CEFAZOLIN <=4 SENSITIVE Sensitive     CEFEPIME <=0.12 SENSITIVE Sensitive     CEFTRIAXONE <=0.25 SENSITIVE  Sensitive     CIPROFLOXACIN <=0.25 SENSITIVE Sensitive     GENTAMICIN <=1 SENSITIVE Sensitive     IMIPENEM <=0.25 SENSITIVE Sensitive     NITROFURANTOIN 64 INTERMEDIATE Intermediate     TRIMETH/SULFA <=20 SENSITIVE Sensitive     AMPICILLIN/SULBACTAM 8 SENSITIVE Sensitive     PIP/TAZO <=4 SENSITIVE Sensitive     * >=100,000 COLONIES/mL KLEBSIELLA PNEUMONIAE  Resp Panel by RT-PCR (Flu A&B, Covid) Nasopharyngeal Swab     Status: None   Collection Time: 02/01/21  1:02 PM   Specimen: Nasopharyngeal Swab; Nasopharyngeal(NP) swabs in vial transport medium  Result Value Ref Range Status   SARS Coronavirus 2 by RT PCR NEGATIVE NEGATIVE Final    Comment: (NOTE) SARS-CoV-2 target nucleic acids are NOT DETECTED.  The SARS-CoV-2 RNA is generally detectable in upper respiratory specimens during the acute phase of infection. The lowest concentration of SARS-CoV-2 viral copies this assay can detect is 138 copies/mL. A negative result does not preclude SARS-Cov-2 infection and should not be used as the sole basis for treatment or other patient management decisions. A negative result may occur with  improper specimen collection/handling, submission of specimen other than nasopharyngeal swab, presence of viral mutation(s) within the areas targeted by this assay, and inadequate number of viral copies(<138 copies/mL). A negative result must be  combined with clinical observations, patient history, and epidemiological information. The expected result is Negative.  Fact Sheet for Patients:  BloggerCourse.com  Fact Sheet for Healthcare Providers:  SeriousBroker.it  This test is no t yet approved or cleared by the [place] FDA and  has been authorized for detection and/or diagnosis of SARS-CoV-2 by FDA under an Emergency Use Authorization (EUA). This EUA will remain  in effect (meaning this test can be used) for the duration of the COVID-19  declaration under Section 564(b)(1) of the Act, 21 U.S.C.section 360bbb-3(b)(1), unless the authorization is terminated  or revoked sooner.       Influenza A by PCR NEGATIVE NEGATIVE Final   Influenza B by PCR NEGATIVE NEGATIVE Final    Comment: (NOTE) The Xpert Xpress SARS-CoV-2/FLU/RSV plus assay is intended as an aid in the diagnosis of influenza from Nasopharyngeal swab specimens and should not be used as a sole basis for treatment. Nasal washings and aspirates are unacceptable for Xpert Xpress SARS-CoV-2/FLU/RSV testing.  Fact Sheet for Patients: BloggerCourse.com  Fact Sheet for Healthcare Providers: SeriousBroker.it  This test is not yet approved or cleared by the [place] FDA and has been authorized for detection and/or diagnosis of SARS-CoV-2 by FDA under an Emergency Use Authorization (EUA). This EUA will remain in effect (meaning this test can be used) for the duration of the COVID-19 declaration under Section 564(b)(1) of the Act, 21 U.S.C. section 360bbb-3(b)(1), unless the authorization is terminated or revoked.  Performed at [facility], [address] [place], [place] [address]   Blood culture (routine x 2)     Status: Abnormal   Collection Time: 02/01/21  1:40 PM   Specimen: BLOOD  Result Value Ref Range Status   Specimen Description   Final    BLOOD RIGHT ANTECUBITAL Performed at [facility], [address] [place], [place] [address]    Special Requests   Final    BOTTLES DRAWN AEROBIC AND ANAEROBIC Blood Culture results may not be optimal due to an inadequate volume of blood received in culture bottles Performed at [facility], [address], [place], [place] [address]    Culture  Setup Time   Final    GRAM POSITIVE COCCI IN PAIRS IN BOTH AEROBIC AND ANAEROBIC BOTTLES GRAM NEGATIVE RODS AEROBIC BOTTLE ONLY CRITICAL VALUE NOTED.  VALUE  IS CONSISTENT WITH PREVIOUSLY REPORTED AND CALLED VALUE.    Culture (A)  Final    ACINETOBACTER CALCOACETICUS/BAUMANNII COMPLEX ENTEROCOCCUS FAECALIS SUSCEPTIBILITIES PERFORMED ON PREVIOUS CULTURE WITHIN THE LAST 5 DAYS. Performed at [facility], [address] [place] [address], [place], [place] [address]    Report Status 02/04/2021 FINAL  Final  Blood culture (routine x 2)     Status: Abnormal   Collection Time: 02/01/21  1:48 PM   Specimen: BLOOD RIGHT FOREARM  Result Value Ref Range Status   Specimen Description   Final    BLOOD RIGHT FOREARM Performed at [facility], [address], [place], [place] [address]    Special Requests   Final    BOTTLES DRAWN AEROBIC AND ANAEROBIC Blood Culture adequate volume Performed at [facility], [address], [place], [place] [address]    Culture  Setup Time   Final    GRAM POSITIVE COCCI IN PAIRS IN BOTH AEROBIC AND ANAEROBIC BOTTLES GRAM NEGATIVE RODS AEROBIC BOTTLE ONLY CRITICAL RESULT CALLED TO, READ BACK BY AND VERIFIED WITH: EFelisa Bonier 1194 02/02/2021 T. TYSOR Performed at  [facility], [address] [place], [place] [id number]    Culture (A)  Final    ACINETOBACTER CALCOACETICUS/BAUMANNII COMPLEX VANCOMYCIN RESISTANT ENTEROCOCCUS    Report Status 02/04/2021 FINAL  Final   Organism ID, Bacteria ACINETOBACTER CALCOACETICUS/BAUMANNII COMPLEX  Final   Organism ID, Bacteria VANCOMYCIN RESISTANT ENTEROCOCCUS  Final      Susceptibility   Acinetobacter calcoaceticus/baumannii complex - MIC*    CEFTAZIDIME 16 INTERMEDIATE Intermediate     CIPROFLOXACIN 1 SENSITIVE Sensitive     GENTAMICIN <=1 SENSITIVE Sensitive     IMIPENEM <=0.25 SENSITIVE Sensitive     PIP/TAZO 32 INTERMEDIATE Intermediate     TRIMETH/SULFA <=20 SENSITIVE Sensitive     AMPICILLIN/SULBACTAM <=2 SENSITIVE Sensitive     * ACINETOBACTER CALCOACETICUS/BAUMANNII COMPLEX   Vancomycin resistant enterococcus - MIC*     AMPICILLIN <=2 SENSITIVE Sensitive     VANCOMYCIN >=32 RESISTANT Resistant     GENTAMICIN SYNERGY SENSITIVE Sensitive     LINEZOLID 2 SENSITIVE Sensitive     * VANCOMYCIN RESISTANT ENTEROCOCCUS  Blood Culture ID Panel (Reflexed)     Status: Abnormal   Collection Time: 02/01/21  1:48 PM  Result Value Ref Range Status   Enterococcus faecalis DETECTED (A) NOT DETECTED Final    Comment: CRITICAL RESULT CALLED TO, READ BACK BY AND VERIFIED WITH: EFelisa Bonier. JACKSON,PHARMD 0411 02/02/2021 T. TYSOR    Enterococcus Faecium NOT DETECTED NOT DETECTED Final   Listeria monocytogenes NOT DETECTED NOT DETECTED Final   Staphylococcus species NOT DETECTED NOT DETECTED Final   Staphylococcus aureus (BCID) NOT DETECTED NOT DETECTED Final   Staphylococcus epidermidis NOT DETECTED NOT DETECTED Final   Staphylococcus lugdunensis NOT DETECTED NOT DETECTED Final   Streptococcus species NOT DETECTED NOT DETECTED Final   Streptococcus agalactiae NOT DETECTED NOT DETECTED Final   Streptococcus pneumoniae NOT DETECTED NOT DETECTED Final   Streptococcus pyogenes NOT DETECTED NOT DETECTED Final   A.calcoaceticus-baumannii DETECTED (A) NOT DETECTED Final    Comment: CRITICAL RESULT CALLED TO, READ BACK BY AND VERIFIED WITH: E. JACKSON,PHARMD 0411 02/02/2021 T. TYSOR    Bacteroides fragilis NOT DETECTED NOT DETECTED Final   Enterobacterales NOT DETECTED NOT DETECTED Final   Enterobacter cloacae complex NOT DETECTED NOT DETECTED Final   Escherichia coli NOT DETECTED NOT DETECTED Final   Klebsiella aerogenes NOT DETECTED NOT DETECTED Final   Klebsiella oxytoca NOT DETECTED NOT DETECTED Final   Klebsiella pneumoniae NOT DETECTED NOT DETECTED Final   Proteus species NOT DETECTED NOT DETECTED Final   Salmonella species NOT DETECTED NOT DETECTED Final   Serratia marcescens NOT DETECTED NOT DETECTED Final   Haemophilus influenzae NOT DETECTED NOT DETECTED Final   Neisseria meningitidis NOT DETECTED NOT DETECTED Final    Pseudomonas aeruginosa NOT DETECTED NOT DETECTED Final   Stenotrophomonas maltophilia NOT DETECTED NOT DETECTED Final   Candida albicans NOT DETECTED NOT DETECTED Final   Candida auris NOT DETECTED NOT DETECTED Final   Candida glabrata NOT DETECTED NOT DETECTED Final   Candida krusei NOT DETECTED NOT DETECTED Final   Candida parapsilosis NOT DETECTED NOT DETECTED Final   Candida tropicalis NOT DETECTED NOT DETECTED Final   Cryptococcus neoformans/gattii NOT DETECTED NOT DETECTED Final   CTX-M ESBL NOT DETECTED NOT DETECTED Final   Carbapenem resistance IMP NOT DETECTED NOT DETECTED Final   Carbapenem resistance KPC NOT DETECTED NOT DETECTED Final   Carbapenem resistance NDM NOT DETECTED NOT DETECTED Final   Vancomycin resistance DETECTED (A) NOT DETECTED Final    Comment: CRITICAL RESULT CALLED  TO, READ BACK BY AND VERIFIED WITH: E. JACKSON,PHARMD 0411 02/02/2021 T. TYSOR    Carbapenem resistance VIM NOT DETECTED NOT DETECTED Final    Comment: Performed at [facility], [address], [place], [place] [address]  Aerobic/Anaerobic Culture w Gram Stain (surgical/deep wound)     Status: None   Collection Time: 02/01/21  1:49 PM   Specimen: Abscess  Result Value Ref Range Status   Specimen Description   Final    ABSCESS LEFT LEG Performed at [facility], [address] [place], [place] [address]    Special Requests   Final    Immunocompromised Performed at [facility], [address], [place], [place] [address]    Gram Stain   Final    FEW WBC PRESENT,BOTH PMN AND MONONUCLEAR FEW GRAM NEGATIVE RODS    Culture   Final    MODERATE KLEBSIELLA PNEUMONIAE RARE PSEUDOMONAS AERUGINOSA NO ANAEROBES ISOLATED Performed at [facility], [address] [place], [place] [address]    Report Status 02/07/2021 FINAL  Final   Organism ID, Bacteria KLEBSIELLA PNEUMONIAE  Final   Organism ID, Bacteria PSEUDOMONAS AERUGINOSA  Final       Susceptibility   Klebsiella pneumoniae - MIC*    AMPICILLIN >=32 RESISTANT Resistant     CEFAZOLIN <=4 SENSITIVE Sensitive     CEFEPIME <=0.12 SENSITIVE Sensitive     CEFTAZIDIME <=1 SENSITIVE Sensitive     CEFTRIAXONE <=0.25 SENSITIVE Sensitive     CIPROFLOXACIN <=0.25 SENSITIVE Sensitive     GENTAMICIN <=1 SENSITIVE Sensitive     IMIPENEM <=0.25 SENSITIVE Sensitive     TRIMETH/SULFA <=20 SENSITIVE Sensitive     AMPICILLIN/SULBACTAM 4 SENSITIVE Sensitive     PIP/TAZO <=4 SENSITIVE Sensitive     * MODERATE KLEBSIELLA PNEUMONIAE   Pseudomonas aeruginosa - MIC*    CEFTAZIDIME 4 SENSITIVE Sensitive     CIPROFLOXACIN <=0.25 SENSITIVE Sensitive     GENTAMICIN <=1 SENSITIVE Sensitive     IMIPENEM 1 SENSITIVE Sensitive     PIP/TAZO 8 SENSITIVE Sensitive     CEFEPIME 2 SENSITIVE Sensitive     * RARE PSEUDOMONAS AERUGINOSA  Culture, blood (Routine X 2) w Reflex to ID Panel     Status: None (Preliminary result)   Collection Time: 02/03/21  8:43 PM   Specimen: BLOOD LEFT HAND  Result Value Ref Range Status   Specimen Description   Final    BLOOD LEFT HAND Blood Culture adequate volume BOTTLES DRAWN AEROBIC ONLY Performed at [facility], [address], [place], [place] [address]    Special Requests   Final    NONE Performed at [facility], [address] [place], [place] [address]    Culture   Final    NO GROWTH 4 DAYS Performed at [facility], [address], [place], [place] [address]    Report Status PENDING  Incomplete  Culture, blood (Routine X 2) w Reflex to ID Panel     Status: None (Preliminary result)   Collection Time: 02/03/21  8:43 PM   Specimen: BLOOD RIGHT ARM  Result Value Ref Range Status   Specimen Description   Final    BLOOD RIGHT ARM Blood Culture adequate volume BOTTLES DRAWN AEROBIC ONLY Performed at [facility], [address] [place], [place] [address]    Special Requests   Final     NONE Performed at [facility], [address] [place], [place] [address]    Culture   Final    NO GROWTH 4 DAYS Performed at [facility], [address] [place], [place] [address]    Report Status PENDING  Incomplete    Impression/Plan:  1. Leg wound - Pseudomonas and Klebsiella, sensitivities noted.  On imipenem to cover this and bacteremia.  Plan for 14 days through 02/17/21.    2.  Bacteremia - Acinetobacter and E faecalis (VRE, amp sensitive) and on imipenem as above.  Most c/w line-related infection.  Will treat as above through 02/17/21.    3.  Access - picc line attempted and will need IR involvement.    OPAT placed and ok from ID standpoint for discharge Follow up appointment arranged with Dr. Drue Second on 7/26  I will sign off, call with questions

## 2021-02-08 ENCOUNTER — Inpatient Hospital Stay (HOSPITAL_COMMUNITY): Payer: Medicare Other

## 2021-02-08 DIAGNOSIS — F419 Anxiety disorder, unspecified: Secondary | ICD-10-CM | POA: Diagnosis not present

## 2021-02-08 DIAGNOSIS — A419 Sepsis, unspecified organism: Secondary | ICD-10-CM | POA: Diagnosis not present

## 2021-02-08 DIAGNOSIS — I2699 Other pulmonary embolism without acute cor pulmonale: Secondary | ICD-10-CM | POA: Diagnosis not present

## 2021-02-08 DIAGNOSIS — F32A Depression, unspecified: Secondary | ICD-10-CM | POA: Diagnosis not present

## 2021-02-08 LAB — BASIC METABOLIC PANEL
Anion gap: 8 (ref 5–15)
BUN: 18 mg/dL (ref 6–20)
CO2: 24 mmol/L (ref 22–32)
Calcium: 9 mg/dL (ref 8.9–10.3)
Chloride: 106 mmol/L (ref 98–111)
Creatinine, Ser: 1.11 mg/dL — ABNORMAL HIGH (ref 0.44–1.00)
GFR, Estimated: >60 mL/min (ref >60)
Glucose, Bld: 78 mg/dL (ref 70–99)
Potassium: 5 mmol/L (ref 3.5–5.1)
Sodium: 138 mmol/L (ref 135–145)

## 2021-02-08 LAB — GLUCOSE, CAPILLARY
Glucose-Capillary: 106 mg/dL — ABNORMAL HIGH (ref 70–99)
Glucose-Capillary: 116 mg/dL — ABNORMAL HIGH (ref 70–99)
Glucose-Capillary: 124 mg/dL — ABNORMAL HIGH (ref 70–99)
Glucose-Capillary: 127 mg/dL — ABNORMAL HIGH (ref 70–99)
Glucose-Capillary: 73 mg/dL (ref 70–99)
Glucose-Capillary: 90 mg/dL (ref 70–99)

## 2021-02-08 LAB — CULTURE, BLOOD (ROUTINE X 2)
Culture: NO GROWTH
Culture: NO GROWTH

## 2021-02-08 MED ORDER — LIDOCAINE HCL 1 % IJ SOLN
INTRAMUSCULAR | Status: AC
  Filled 2021-02-08: qty 20

## 2021-02-08 MED ORDER — INSULIN ASPART 100 UNIT/ML IJ SOLN
0.0000 [IU] | Freq: Three times a day (TID) | INTRAMUSCULAR | Status: DC
Start: 2021-02-08 — End: 2021-02-09
  Administered 2021-02-08 – 2021-02-09 (×3): 3 [IU] via SUBCUTANEOUS

## 2021-02-08 MED ORDER — INSULIN ASPART 100 UNIT/ML IJ SOLN
0.0000 [IU] | Freq: Every day | INTRAMUSCULAR | Status: DC
Start: 2021-02-08 — End: 2021-02-09

## 2021-02-08 MED ORDER — LIDOCAINE HCL 1 % IJ SOLN
INTRAMUSCULAR | Status: DC | PRN
  Administered 2021-02-08: 10 mL via INTRADERMAL

## 2021-02-08 MED ORDER — HEPARIN SOD (PORK) LOCK FLUSH 100 UNIT/ML IV SOLN
INTRAVENOUS | Status: AC
  Administered 2021-02-08: 500 [IU]
  Filled 2021-02-08: qty 5

## 2021-02-08 NOTE — Progress Notes (Addendum)
PROGRESS NOTE    Karen Bennett  JXB:[id number] DOB: [birth year] DOA: 02/01/2021 PCP: Pablo Lawrence, NP   Brief Narrative: 45 y.o. female with medical history significant of type 2 diabetes mellitus, prior history of DVT/PE, right BKA, super morbid obesity, fungemia, endocarditis who was hospitalized from 12/26/2020 through 01/05/2021 for left lateral thigh abscess and during this hospitalization she was also diagnosed with Candida albicans fungemia/mitral valve endocarditis and she was discharged home on IV micafungin for 6 weeks.  She completed 9 days of IV antibiotics for left thigh abscess which grew Pseudomonas.  She was admitted with nausea vomiting and diarrhea.    She is seen by general surgery and infectious disease.  She had dressing changes on the left thigh with Dakin's for 72 hours.  CT angiogram showed a chronic left-sided PE.  Assessment & Plan:   Active Problems:   DM (diabetes mellitus), type 2, uncontrolled with complications (HCC)   Pulmonary emboli (HCC)   Anxiety and depression   HTN (hypertension)   Infective endocarditis   Sepsis ([place])  #1 sepsis present on admission secondary to left thigh abscess growing Klebsiella and Pseudomonas/VRE and acinetobacter bacteremia.  She met criteria for sepsis at the time of admission with tachypnea tachycardia fever and leukocytosis.  She was being treated as an outpatient for fungal mitral valve endocarditis and had a PICC line on admission.  It was thought she had PICC line related bloodstream infection with VRE and Acinetobacter.  PICC line was removed on 02/03/2021.  Repeat blood cultures were done after the PICC line was removed which are negative so far.  She is status post incision and drainage of the left thigh abscess with Dakin's solution dressings for 3 days. Klebsiella and pseudomonas  from left leg culture, VRE and Acinetobacter from blood culture She was followed by ID and general surgery.  They have signed off. PICC  line dcd 7/14 repeat culture negative  plan for PICC line by IR   ON IMIPENEM SINCE 7/15 TO COVER ALL-Klebsiella Pseudomonas Acinetobacter VRE OPAT orders done by pharmacy already. Patient is waiting for PICC line to be placed and then discharged home.  #2 Candida mitral valve endocarditis-continue Diflucan.  She did not tolerate again as an outpatient.  Upon discharge we will give her 30-day supply of Diflucan and have her follow-up with Dr. Graylon Good.  #3 uncontrolled type 2 diabetes with hyperglycemia-CBG improved since increasing Lantus to 60 units and NovoLog to 6 units 3 times a day.   CBG (last 3)  Recent Labs    02/08/21 0442 02/08/21 0730 02/08/21 1132  GLUCAP 73 90 106*    #4 CKD stage IIIa at baseline  #5 history of essential hypertension on lisinopril and Lopressor.  #6 anxiety and depression on Wellbutrin and Effexor  #7 hyperlipidemia continue statin  #8 hypokalemia repleted  #9 chronic pulmonary embolism -on Lovenox for now restart Xarelto once procedures are done.  She still has to get a PICC line.    #10 pseudo hyponatremia due to hyperglycemia resolved.  Pressure Injury 12/27/20 Buttocks Right;Left Stage 2 -  Partial thickness loss of dermis presenting as a shallow open injury with a red, pink wound bed without slough. (Active)  12/27/20 1200  Location: Buttocks  Location Orientation: Right;Left  Staging: Stage 2 -  Partial thickness loss of dermis presenting as a shallow open injury with a red, pink wound bed without slough.  Wound Description (Comments):   Present on Admission:      Estimated body  mass index is 64.87 kg/m as calculated from the following:   Height as of this encounter: _0  (1.676 m).   Weight as of this encounter: 182.3 kg.  DVT prophylaxis: IV heparin Code Status: Full code Family Communication: None at bedside Disposition Plan: Home  Remains inpatient appropriate because:IV treatments appropriate due to intensity of illness or  inability to take PO  Dispo: The patient is from: Home              Anticipated d/c is to: Home              Patient currently is not medically stable to d/c.   Difficult to place patient No    Consultants: General surgery and infectious disease  Procedures: Incision and drainage of the left thigh abscess 02/02/2021 Antimicrobials: Anti-infectives (From admission, onward)    Start     Dose/Rate Route Frequency Ordered Stop   02/04/21 1800  imipenem-cilastatin (PRIMAXIN) 1,000 mg in sodium chloride 0.9 % 250 mL IVPB        1,000 mg 250 mL/hr over 60 Minutes Intravenous Every 8 hours 02/04/21 1452     02/03/21 2200  fluconazole (DIFLUCAN) tablet 400 mg        400 mg Oral Daily at bedtime 02/03/21 1528     02/03/21 2200  linezolid (ZYVOX) tablet 600 mg  Status:  Discontinued        600 mg Oral Every 12 hours 02/03/21 1528 02/04/21 1452   02/02/21 2000  DAPTOmycin (CUBICIN) 1,200 mg in sodium chloride 0.9 % IVPB  Status:  Discontinued        1,200 mg 148 mL/hr over 30 Minutes Intravenous Daily 02/02/21 0805 02/03/21 1528   02/02/21 0530  DAPTOmycin (CUBICIN) 850 mg in sodium chloride 0.9 % IVPB        8 mg/kg  108.5 kg (Adjusted) 134 mL/hr over 30 Minutes Intravenous  Once 02/02/21 0431 02/02/21 0611   02/02/21 0530  Ampicillin-Sulbactam (UNASYN) 3 g in sodium chloride 0.9 % 100 mL IVPB  Status:  Discontinued        3 g 200 mL/hr over 30 Minutes Intravenous Every 6 hours 02/02/21 0431 02/04/21 1452   02/02/21 0500  Vancomycin (VANCOCIN) 1,250 mg in sodium chloride 0.9 % 250 mL IVPB  Status:  Discontinued        1,250 mg 166.7 mL/hr over 90 Minutes Intravenous Every 12 hours 02/01/21 1647 02/02/21 0431   02/01/21 2200  ceFEPIme (MAXIPIME) 2 g in sodium chloride 0.9 % 100 mL IVPB  Status:  Discontinued        2 g 200 mL/hr over 30 Minutes Intravenous Every 8 hours 02/01/21 1639 02/02/21 0431   02/01/21 2200  fluconazole (DIFLUCAN) IVPB 400 mg  Status:  Discontinued        400 mg 100  mL/hr over 120 Minutes Intravenous Every 24 hours 02/01/21 1732 02/03/21 1528   02/01/21 1800  anidulafungin (ERAXIS) 200 mg in sodium chloride 0.9 % 200 mL IVPB  Status:  Discontinued        200 mg 78 mL/hr over 200 Minutes Intravenous Every 24 hours 02/01/21 1639 02/01/21 1731   02/01/21 1700  ceFEPIme (MAXIPIME) 2 g in sodium chloride 0.9 % 100 mL IVPB  Status:  Discontinued        2 g 200 mL/hr over 30 Minutes Intravenous  Once 02/01/21 1654 02/01/21 1658   02/01/21 1700  vancomycin (VANCOCIN) IVPB 1000 mg/200 mL premix  Status:  Discontinued  1,000 mg 200 mL/hr over 60 Minutes Intravenous  Once 02/01/21 1654 02/01/21 1659   02/01/21 1400  ceFEPIme (MAXIPIME) 2 g in sodium chloride 0.9 % 100 mL IVPB        2 g 200 mL/hr over 30 Minutes Intravenous  Once 02/01/21 1347 02/01/21 1605   02/01/21 1400  vancomycin (VANCOCIN) 2,500 mg in sodium chloride 0.9 % 500 mL IVPB        2,500 mg 250 mL/hr over 120 Minutes Intravenous  Once 02/01/21 1348 02/01/21 1805        Subjective:   She is resting in bed denies any nausea vomiting or diarrhea anxious to go home Hoping to get PICC line in today Objective: Vitals:   02/07/21 1310 02/07/21 2045 02/08/21 0446 02/08/21 1137  BP: (!) 142/65 137/66 (!) 141/67 122/71  Pulse: (!) 57 62 62 60  Resp: _0 Temp: 97.9 F (36.6 C) 98.1 F (36.7 C) 98.2 F (36.8 C) 98 F (36.7 C)  TempSrc: Oral Oral Oral Oral  SpO2: 100% 95% 97% 97%  Weight:      Height:        Intake/Output Summary (Last 24 hours) at 02/08/2021 1333 Last data filed at 02/08/2021 1100 Gross per 24 hour  Intake 997.45 ml  Output 2000 ml  Net -1002.55 ml    Filed Weights   02/01/21 1409  Weight: (!) 182.3 kg    Examination:  General exam: Appears in distress due to pain Respiratory system: Clear to auscultation. Respiratory effort normal. Cardiovascular system: S1 & S2 heard, RRR. No JVD, murmurs, rubs, gallops or clicks. No pedal  edema. Gastrointestinal system: Abdomen is nondistended, soft and nontender. No organomegaly or masses felt. Normal bowel sounds heard. Central nervous system: Alert and oriented. No focal neurological deficits. Extremities: Right BKA, left lower extremity with 1+ edema, left thigh wound appears clean with very minimal drainage. skin: No rashes, lesions or ulcers Psychiatry: Judgement and insight appear normal. Mood & affect appropriate.     Data Reviewed: I have personally reviewed following labs and imaging studies  CBC: Recent Labs  Lab 02/03/21 0340 02/04/21 0551 02/05/21 0448 02/06/21 0525 02/07/21 0414  WBC 10.9* 10.3 8.9 9.9 8.8  HGB 10.6* 10.0* 9.8* 9.9* 9.9*  HCT 32.6* 30.7* 31.8* 32.6* 32.7*  MCV 86.7 88.2 91.4 92.1 93.4  PLT 262 269 294 349 841    Basic Metabolic Panel: Recent Labs  Lab 02/01/21 1650 02/02/21 0116 02/03/21 0340 02/05/21 0448 02/06/21 0525 02/07/21 0414 02/08/21 0424  NA  --    < > 132* 137 138 135 138  K  --    < > 3.4* 3.6 4.3 4.6 5.0  CL  --    < > 102 104 106 106 106  CO2  --    < > _1 GLUCOSE 524*   < > 227* 108* 119* 128* 78  BUN  --    < > 16 20 21* 19 18  CREATININE  --    < > 1.04* 1.18* 1.08* 1.07* 1.11*  CALCIUM  --    < > 8.4* 8.8* 9.0 8.6* 9.0  MG 1.9  --   --   --   --   --   --    < > = values in this interval not displayed.    GFR: Estimated Creatinine Clearance: 111.9 mL/min (A) (by C-G formula based on SCr of 1.11 mg/dL (H)). Liver Function Tests: Recent  Labs  Lab 02/02/21 0116 02/03/21 0340  AST 9* 8*  ALT 8 7  ALKPHOS 66 55  BILITOT 0.8 0.4  PROT 6.2* 6.4*  ALBUMIN 1.8* 1.8*    No results for input(s): LIPASE, AMYLASE in the last 168 hours. No results for input(s): AMMONIA in the last 168 hours. Coagulation Profile: Recent Labs  Lab 02/02/21 0116  INR 1.1    Cardiac Enzymes: Recent Labs  Lab 02/03/21 0340  CKTOTAL 22*    BNP (last 3 results) No results for input(s): PROBNP in  the last 8760 hours. HbA1C: No results for input(s): HGBA1C in the last 72 hours. CBG: Recent Labs  Lab 02/07/21 2041 02/08/21 0014 02/08/21 0442 02/08/21 0730 02/08/21 1132  GLUCAP 124* 116* 73 90 106*    Lipid Profile: No results for input(s): CHOL, HDL, LDLCALC, TRIG, CHOLHDL, LDLDIRECT in the last 72 hours. Thyroid Function Tests: No results for input(s): TSH, T4TOTAL, FREET4, T3FREE, THYROIDAB in the last 72 hours. Anemia Panel: No results for input(s): VITAMINB12, FOLATE, FERRITIN, TIBC, IRON, RETICCTPCT in the last 72 hours. Sepsis Labs: Recent Labs  Lab 02/01/21 1501 02/02/21 0116  PROCALCITON  --  3.75  LATICACIDVEN 1.3  --      Recent Results (from the past 240 hour(s))  Urine culture     Status: Abnormal   Collection Time: 02/01/21 12:33 PM   Specimen: In/Out Cath Urine  Result Value Ref Range Status   Specimen Description   Final    IN/OUT CATH URINE Performed at [facility], [place] [address], [place], [place] [address]    Special Requests   Final    NONE Performed at [facility], [place] [address] [place], [place] [address]    Culture >=100,000 COLONIES/mL KLEBSIELLA PNEUMONIAE (A)  Final   Report Status 02/04/2021 FINAL  Final   Organism ID, Bacteria KLEBSIELLA PNEUMONIAE (A)  Final      Susceptibility   Klebsiella pneumoniae - MIC*    AMPICILLIN >=32 RESISTANT Resistant     CEFAZOLIN <=4 SENSITIVE Sensitive     CEFEPIME <=0.12 SENSITIVE Sensitive     CEFTRIAXONE <=0.25 SENSITIVE Sensitive     CIPROFLOXACIN <=0.25 SENSITIVE Sensitive     GENTAMICIN <=1 SENSITIVE Sensitive     IMIPENEM <=0.25 SENSITIVE Sensitive     NITROFURANTOIN 64 INTERMEDIATE Intermediate     TRIMETH/SULFA <=20 SENSITIVE Sensitive     AMPICILLIN/SULBACTAM 8 SENSITIVE Sensitive     PIP/TAZO <=4 SENSITIVE Sensitive     * >=100,000 COLONIES/mL KLEBSIELLA PNEUMONIAE  Resp Panel by RT-PCR (Flu A&B, Covid) Nasopharyngeal Swab     Status: None    Collection Time: 02/01/21  1:02 PM   Specimen: Nasopharyngeal Swab; Nasopharyngeal(NP) swabs in vial transport medium  Result Value Ref Range Status   SARS Coronavirus 2 by RT PCR NEGATIVE NEGATIVE Final    Comment: (NOTE) SARS-CoV-2 target nucleic acids are NOT DETECTED.  The SARS-CoV-2 RNA is generally detectable in upper respiratory specimens during the acute phase of infection. The lowest concentration of SARS-CoV-2 viral copies this assay can detect is 138 copies/mL. A negative result does not preclude SARS-Cov-2 infection and should not be used as the sole basis for treatment or other patient management decisions. A negative result may occur with  improper specimen collection/handling, submission of specimen other than nasopharyngeal swab, presence of viral mutation(s) within the areas targeted by this assay, and inadequate number of viral copies(<138 copies/mL). A negative result must be combined with clinical observations, patient history,  and epidemiological information. The expected result is Negative.  Fact Sheet for Patients:  EntrepreneurPulse.com.au  Fact Sheet for Healthcare Providers:  IncredibleEmployment.be  This test is no t yet approved or cleared by the [place] FDA and  has been authorized for detection and/or diagnosis of SARS-CoV-2 by FDA under an Emergency Use Authorization (EUA). This EUA will remain  in effect (meaning this test can be used) for the duration of the COVID-19 declaration under Section 564(b)(1) of the Act, 21 U.S.C.section 360bbb-3(b)(1), unless the authorization is terminated  or revoked sooner.       Influenza A by PCR NEGATIVE NEGATIVE Final   Influenza B by PCR NEGATIVE NEGATIVE Final    Comment: (NOTE) The Xpert Xpress SARS-CoV-2/FLU/RSV plus assay is intended as an aid in the diagnosis of influenza from Nasopharyngeal swab specimens and should not be used as a sole basis for treatment.  Nasal washings and aspirates are unacceptable for Xpert Xpress SARS-CoV-2/FLU/RSV testing.  Fact Sheet for Patients: EntrepreneurPulse.com.au  Fact Sheet for Healthcare Providers: IncredibleEmployment.be  This test is not yet approved or cleared by the [place] FDA and has been authorized for detection and/or diagnosis of SARS-CoV-2 by FDA under an Emergency Use Authorization (EUA). This EUA will remain in effect (meaning this test can be used) for the duration of the COVID-19 declaration under Section 564(b)(1) of the Act, 21 U.S.C. section 360bbb-3(b)(1), unless the authorization is terminated or revoked.  Performed at [facility], [place] [address], [place], [place] [address]   Blood culture (routine x 2)     Status: Abnormal   Collection Time: 02/01/21  1:40 PM   Specimen: BLOOD  Result Value Ref Range Status   Specimen Description   Final    BLOOD RIGHT ANTECUBITAL Performed at [facility] [address], [place], [place] [address]    Special Requests   Final    BOTTLES DRAWN AEROBIC AND ANAEROBIC Blood Culture results may not be optimal due to an inadequate volume of blood received in culture bottles Performed at [facility] [address] [place], [place] [address]    Culture  Setup Time   Final    GRAM POSITIVE COCCI IN PAIRS IN BOTH AEROBIC AND ANAEROBIC BOTTLES GRAM NEGATIVE RODS AEROBIC BOTTLE ONLY CRITICAL VALUE NOTED.  VALUE IS CONSISTENT WITH PREVIOUSLY REPORTED AND CALLED VALUE.    Culture (A)  Final    ACINETOBACTER CALCOACETICUS/BAUMANNII COMPLEX ENTEROCOCCUS FAECALIS SUSCEPTIBILITIES PERFORMED ON PREVIOUS CULTURE WITHIN THE LAST 5 DAYS. Performed at [facility], [place] [address], [place], [place] [address]    Report Status 02/04/2021 FINAL  Final  Blood culture (routine x 2)     Status: Abnormal   Collection Time: 02/01/21  1:48 PM   Specimen:  BLOOD RIGHT FOREARM  Result Value Ref Range Status   Specimen Description   Final    BLOOD RIGHT FOREARM Performed at [facility] [address], [place], [place] [address]    Special Requests   Final    BOTTLES DRAWN AEROBIC AND ANAEROBIC Blood Culture adequate volume Performed at [facility] [address] [place], [place] [address]    Culture  Setup Time   Final    GRAM POSITIVE COCCI IN PAIRS IN BOTH AEROBIC AND ANAEROBIC BOTTLES GRAM NEGATIVE RODS AEROBIC BOTTLE ONLY CRITICAL RESULT CALLED TO, READ BACK BY AND VERIFIED WITH: Gustavo Lah [id number] 02/02/2021 Mena Goes Performed at [facility], [place]  [address], [place], [place] [address]    Culture (A)  Final    ACINETOBACTER CALCOACETICUS/BAUMANNII COMPLEX VANCOMYCIN RESISTANT ENTEROCOCCUS    Report Status 02/04/2021 FINAL  Final   Organism ID, Bacteria ACINETOBACTER CALCOACETICUS/BAUMANNII COMPLEX  Final   Organism ID, Bacteria VANCOMYCIN RESISTANT ENTEROCOCCUS  Final      Susceptibility   Acinetobacter calcoaceticus/baumannii complex - MIC*    CEFTAZIDIME 16 INTERMEDIATE Intermediate     CIPROFLOXACIN 1 SENSITIVE Sensitive     GENTAMICIN <=1 SENSITIVE Sensitive     IMIPENEM <=0.25 SENSITIVE Sensitive     PIP/TAZO 32 INTERMEDIATE Intermediate     TRIMETH/SULFA <=20 SENSITIVE Sensitive     AMPICILLIN/SULBACTAM <=2 SENSITIVE Sensitive     * ACINETOBACTER CALCOACETICUS/BAUMANNII COMPLEX   Vancomycin resistant enterococcus - MIC*    AMPICILLIN <=2 SENSITIVE Sensitive     VANCOMYCIN >=32 RESISTANT Resistant     GENTAMICIN SYNERGY SENSITIVE Sensitive     LINEZOLID 2 SENSITIVE Sensitive     * VANCOMYCIN RESISTANT ENTEROCOCCUS  Blood Culture ID Panel (Reflexed)     Status: Abnormal   Collection Time: 02/01/21  1:48 PM  Result Value Ref Range Status   Enterococcus faecalis DETECTED (A) NOT DETECTED Final    Comment: CRITICAL RESULT CALLED TO, READ BACK BY AND VERIFIED WITH: EMariah Milling 0411 02/02/2021 T. TYSOR    Enterococcus Faecium NOT DETECTED NOT DETECTED Final   Listeria monocytogenes NOT DETECTED NOT DETECTED Final   Staphylococcus species NOT DETECTED NOT DETECTED Final   Staphylococcus aureus (BCID) NOT DETECTED NOT DETECTED Final   Staphylococcus epidermidis NOT DETECTED NOT DETECTED Final   Staphylococcus lugdunensis NOT DETECTED NOT DETECTED Final   Streptococcus species NOT DETECTED NOT DETECTED Final   Streptococcus agalactiae NOT DETECTED NOT DETECTED Final   Streptococcus pneumoniae NOT DETECTED NOT DETECTED Final   Streptococcus pyogenes NOT DETECTED NOT DETECTED Final   A.calcoaceticus-baumannii DETECTED (A) NOT DETECTED Final    Comment: CRITICAL RESULT CALLED TO, READ BACK BY AND VERIFIED WITH: E. JACKSON,PHARMD 0411 02/02/2021 T. TYSOR    Bacteroides fragilis NOT DETECTED NOT DETECTED Final   Enterobacterales NOT DETECTED NOT DETECTED Final   Enterobacter cloacae complex NOT DETECTED NOT DETECTED Final   Escherichia coli NOT DETECTED NOT DETECTED Final   Klebsiella aerogenes NOT DETECTED NOT DETECTED Final   Klebsiella oxytoca NOT DETECTED NOT DETECTED Final   Klebsiella pneumoniae NOT DETECTED NOT DETECTED Final   Proteus species NOT DETECTED NOT DETECTED Final   Salmonella species NOT DETECTED NOT DETECTED Final   Serratia marcescens NOT DETECTED NOT DETECTED Final   Haemophilus influenzae NOT DETECTED NOT DETECTED Final   Neisseria meningitidis NOT DETECTED NOT DETECTED Final   Pseudomonas aeruginosa NOT DETECTED NOT DETECTED Final   Stenotrophomonas maltophilia NOT DETECTED NOT DETECTED Final   Candida albicans NOT DETECTED NOT DETECTED Final   Candida auris NOT DETECTED NOT DETECTED Final   Candida glabrata NOT DETECTED NOT DETECTED Final   Candida krusei NOT DETECTED NOT DETECTED Final   Candida parapsilosis NOT DETECTED NOT DETECTED Final   Candida tropicalis NOT DETECTED NOT DETECTED Final   Cryptococcus  neoformans/gattii NOT DETECTED NOT DETECTED Final   CTX-M ESBL NOT DETECTED NOT DETECTED Final   Carbapenem resistance IMP NOT DETECTED NOT DETECTED Final   Carbapenem resistance KPC NOT DETECTED NOT DETECTED Final   Carbapenem resistance NDM NOT DETECTED NOT DETECTED Final   Vancomycin resistance DETECTED (A) NOT DETECTED Final    Comment: CRITICAL RESULT CALLED TO, READ BACK BY AND VERIFIED  WITH: Gustavo Lah [phone] 02/02/2021 T. TYSOR    Carbapenem resistance VIM NOT DETECTED NOT DETECTED Final    Comment: Performed at [facility], [place] [address] [place], [place] [address]  Aerobic/Anaerobic Culture w Gram Stain (surgical/deep wound)     Status: None   Collection Time: 02/01/21  1:49 PM   Specimen: Abscess  Result Value Ref Range Status   Specimen Description   Final    ABSCESS LEFT LEG Performed at [facility] [address], [place], [place] [address]    Special Requests   Final    Immunocompromised Performed at [facility], [place] [address] [place], [place] [address]    Gram Stain   Final    FEW WBC PRESENT,BOTH PMN AND MONONUCLEAR FEW GRAM NEGATIVE RODS    Culture   Final    MODERATE KLEBSIELLA PNEUMONIAE RARE PSEUDOMONAS AERUGINOSA NO ANAEROBES ISOLATED Performed at [facility], [address] [place], [place] [address]    Report Status 02/07/2021 FINAL  Final   Organism ID, Bacteria KLEBSIELLA PNEUMONIAE  Final   Organism ID, Bacteria PSEUDOMONAS AERUGINOSA  Final      Susceptibility   Klebsiella pneumoniae - MIC*    AMPICILLIN >=32 RESISTANT Resistant     CEFAZOLIN <=4 SENSITIVE Sensitive     CEFEPIME <=0.12 SENSITIVE Sensitive     CEFTAZIDIME <=1 SENSITIVE Sensitive     CEFTRIAXONE <=0.25 SENSITIVE Sensitive     CIPROFLOXACIN <=0.25 SENSITIVE Sensitive     GENTAMICIN <=1 SENSITIVE Sensitive     IMIPENEM <=0.25 SENSITIVE Sensitive     TRIMETH/SULFA <=20 SENSITIVE Sensitive     AMPICILLIN/SULBACTAM 4  SENSITIVE Sensitive     PIP/TAZO <=4 SENSITIVE Sensitive     * MODERATE KLEBSIELLA PNEUMONIAE   Pseudomonas aeruginosa - MIC*    CEFTAZIDIME 4 SENSITIVE Sensitive     CIPROFLOXACIN <=0.25 SENSITIVE Sensitive     GENTAMICIN <=1 SENSITIVE Sensitive     IMIPENEM 1 SENSITIVE Sensitive     PIP/TAZO 8 SENSITIVE Sensitive     CEFEPIME 2 SENSITIVE Sensitive     * RARE PSEUDOMONAS AERUGINOSA  Culture, blood (Routine X 2) w Reflex to ID Panel     Status: None   Collection Time: 02/03/21  8:43 PM   Specimen: BLOOD LEFT HAND  Result Value Ref Range Status   Specimen Description   Final    BLOOD LEFT HAND Blood Culture adequate volume BOTTLES DRAWN AEROBIC ONLY Performed at [facility] [address], [place], [place] [address]    Special Requests   Final    NONE Performed at [facility], [place] [address] [place], [place] [address]    Culture   Final    NO GROWTH 5 DAYS Performed at [facility], [place] [address] [place], [place] [address]    Report Status 02/08/2021 FINAL  Final  Culture, blood (Routine X 2) w Reflex to ID Panel     Status: None   Collection Time: 02/03/21  8:43 PM   Specimen: BLOOD RIGHT ARM  Result Value Ref Range Status   Specimen Description   Final    BLOOD RIGHT ARM Blood Culture adequate volume BOTTLES DRAWN AEROBIC ONLY Performed at [facility] [address] [place], [place] [address]    Special Requests   Final    NONE Performed at [facility], [place] [address] [place], [place] [address]    Culture  Final    NO GROWTH 5 DAYS Performed at [facility], [place] [address], [place], [place] [address]    Report Status 02/08/2021 FINAL  Final          Radiology Studies: No results found.      Scheduled Meds:  (feeding supplement) PROSource Plus  30 mL Oral BID BM   acetaminophen  650 mg Oral Q6H   buPROPion  300 mg Oral Daily   Chlorhexidine Gluconate Cloth   6 each Topical Daily   enoxaparin (LOVENOX) injection  150 mg Subcutaneous Q12H   fluconazole  400 mg Oral QHS   insulin aspart  0-20 Units Subcutaneous TID WC   insulin aspart  0-5 Units Subcutaneous QHS   insulin aspart  6 Units Subcutaneous TID WC   insulin glargine  60 Units Subcutaneous Daily   lisinopril  2.5 mg Oral Daily   metoprolol tartrate  12.5 mg Oral BID   multivitamin with minerals  1 tablet Oral Daily   pantoprazole  40 mg Oral Daily   Ensure Max Protein  11 oz Oral Daily   traZODone  25 mg Oral QHS   venlafaxine XR  75 mg Oral Daily   Continuous Infusions:  imipenem-cilastatin 1,000 mg (02/08/21 1005)   promethazine (PHENERGAN) injection (IM or IVPB) 25 mg (02/05/21 0424)     LOS: 7 days    Time spent: 40 minutes    Georgette Shell, MD 02/08/2021, 1:33 PM

## 2021-02-08 NOTE — Procedures (Signed)
Interventional Radiology Procedure Note  Procedure: PICC placement   Findings: Please refer to procedural dictation for full description.  35 cm right basilic vein PICC with tip in right subclavian vein.  Unable to pass beyond central subclavian vein.  Complications: None immediate  Estimated Blood Loss: < 5 mL  Recommendations: PICC ready for immediate use. Given persistent right subclavian stenosis, will arrange for outpatient right upper extremity venogram and angioplasty with moderate sedation in short order.   Marliss Coots, MD

## 2021-02-09 DIAGNOSIS — A4152 Sepsis due to Pseudomonas: Secondary | ICD-10-CM

## 2021-02-09 DIAGNOSIS — T80219A Unspecified infection due to central venous catheter, initial encounter: Secondary | ICD-10-CM

## 2021-02-09 DIAGNOSIS — B376 Candidal endocarditis: Secondary | ICD-10-CM

## 2021-02-09 DIAGNOSIS — I771 Stricture of artery: Secondary | ICD-10-CM | POA: Diagnosis present

## 2021-02-09 LAB — GLUCOSE, CAPILLARY
Glucose-Capillary: 123 mg/dL — ABNORMAL HIGH (ref 70–99)
Glucose-Capillary: 76 mg/dL (ref 70–99)
Glucose-Capillary: 140 mg/dL — ABNORMAL HIGH (ref 70–99)

## 2021-02-09 LAB — HCG, QUANTITATIVE, PREGNANCY: hCG, Beta Chain, Quant, S: 1 m[IU]/mL (ref <5)

## 2021-02-09 MED ORDER — METOCLOPRAMIDE HCL 5 MG PO TABS: 5.0000 mg | ORAL_TABLET | Freq: Four times a day (QID) | ORAL | 1 refills | Status: DC | PRN

## 2021-02-09 MED ORDER — METOPROLOL TARTRATE 25 MG PO TABS: 12.5000 mg | ORAL_TABLET | Freq: Two times a day (BID) | ORAL | 1 refills | Status: DC

## 2021-02-09 MED ORDER — IMIPENEM-CILASTATIN IV (FOR PTA / DISCHARGE USE ONLY): 1000.0000 mg | Freq: Three times a day (TID) | INTRAVENOUS | 0 refills | Status: DC

## 2021-02-09 MED ORDER — RIVAROXABAN 20 MG PO TABS
20.0000 mg | ORAL_TABLET | Freq: Every day | ORAL | Status: DC
  Administered 2021-02-09: 20 mg via ORAL
  Filled 2021-02-09: qty 1

## 2021-02-09 MED ORDER — OXYCODONE HCL 15 MG PO TABS: 15.0000 mg | ORAL_TABLET | Freq: Four times a day (QID) | ORAL | 0 refills | Status: DC | PRN

## 2021-02-09 MED ORDER — ACETAMINOPHEN 325 MG PO TABS: 650.0000 mg | ORAL_TABLET | Freq: Four times a day (QID) | ORAL | Status: DC | PRN

## 2021-02-09 MED ORDER — SODIUM CHLORIDE 0.9% FLUSH: 10.0000 mL | INTRAVENOUS | Status: DC | PRN

## 2021-02-09 MED ORDER — INSULIN DETEMIR 100 UNIT/ML ~~LOC~~ SOLN: 60.0000 [IU] | Freq: Every day | SUBCUTANEOUS | 11 refills | Status: DC

## 2021-02-09 MED ORDER — FLUCONAZOLE 200 MG PO TABS
400.0000 mg | ORAL_TABLET | Freq: Every day | ORAL | 0 refills | Status: DC
Start: 2021-02-09 — End: 2021-09-13

## 2021-02-09 NOTE — Progress Notes (Signed)
Nutrition Follow-up  DOCUMENTATION CODES:   Morbid obesity  INTERVENTION:   - Please obtain updated weight  - Continue Ensure Max po daily, each supplement provides 150 kcal and 30 grams of protein  - Continue MVI with minerals daily  - d/c ProSource Plus as pt is refusing  NUTRITION DIAGNOSIS:   Inadequate oral intake related to acute illness, nausea as evidenced by per patient/family report.  Progressing  GOAL:   Patient will meet greater than or equal to 90% of their needs  Progressing  MONITOR:   PO intake, Supplement acceptance, Labs, Weight trends  REASON FOR ASSESSMENT:   Malnutrition Screening Tool    ASSESSMENT:   44 y.o. female with medical history of type 2 DM, prior history of DVT/PE, R BKA, morbid obesity, fungemia, and endocarditis. She was hospitalized from 6/5-6/15 for L lateral thigh abscess and during this hospitalization she was also diagnosed with Candida albicans fungemia/mitral valve endocarditis and she was discharged home on IV micafungin for 6 weeks. She completed 9 days of IV abx. She presented to the ED d/t CBG of >500 mg/dl and intermittent N/V since IV abx start.  Pt admitted with sepsis secondary to L thigh abscess (bacteremia/fungemia) and mitral valve endocarditis. PICC placed today by IR.  Per flowsheet documentation, pt with moderate pitting edema to LLE.  Attempted to reach pt via phone call to room; however, no answer. PO intake has improved with last 4 meal completions charted as 100%. Pt accepting Ensure Max ~75% of the time it is offered per [facility] documentation. Pt refusing ProSource per [facility] documentation. Will d/c ProSource and continue with Ensure Max.  Meal Completion: 100% x last 4 documented meals  Medications reviewed and include: ProSource Plus BID, diflucan, SSI, novolog 6 units TID with meals, lantus 60 units daily, MVI with minerals, protonix, Ensure Max daily, IV abx  Labs reviewed: creatinine 1.11, hemoglobin  9.9 CBG's: 76-127 x 24 hours  UOP: 2300 ml x 24 hours I/O's: +2.6 L since admit  NUTRITION - FOCUSED PHYSICAL EXAM:  Unable to complete at this time. RD working remotely.  Diet Order:   Diet Order             Diet Carb Modified Fluid consistency: Thin; Room service appropriate? Yes  Diet effective now                   EDUCATION NEEDS:   Not appropriate for education at this time  Skin:  Skin Assessment: Skin Integrity Issues: Other: would to L thigh  Last BM:  02/08/21 large type 4  Height:   Ht Readings from Last 1 Encounters:  02/01/21 5\' 6"  (1.676 m)    Weight:   Wt Readings from Last 1 Encounters:  02/01/21 (!) 182.3 kg    Ideal Body Weight:  59 kg  BMI:  Body mass index is 64.87 kg/m.  Estimated Nutritional Needs:   Kcal:  2200-2400 kcal  Protein:  110-125 grams  Fluid:  >/= 2.4 L/day    04/04/21, MS, RD, LDN Inpatient Clinical Dietitian Please see [facility] for contact information.

## 2021-02-09 NOTE — Progress Notes (Signed)
Pt discharged home today per Dr. Rito Ehrlich. Pt's IV site D/C'd and WDL. Pt's VSS. Pt provided with home medication list, discharge instructions and prescriptions. Verbalized understanding. Pt currently awaiting arrival of [facility] for transport home.

## 2021-02-09 NOTE — Discharge Summary (Signed)
Discharge Summary  Karen Bennett IOM:[id number] DOB: 1975/12/16  PCP: Pablo Lawrence, NP  Admit date: 02/01/2021 Discharge date: 02/09/2021  Time spent: 35 minutes  Recommendations for Outpatient Follow-up:  New medication: Oxy IR every 6 hours as needed New medication: Diflucan 400 mg daily Medication change: Patient no longer on lV micafungin  Discharge Diagnoses:  Active Hospital Problems   Diagnosis Date Noted   Morbid obesity ([place]) 02/09/2021   Stenosis of subclavian artery ([place]) 02/09/2021   Sepsis ([place]) 02/01/2021   Infective endocarditis    HTN (hypertension) 12/27/2020   Anxiety and depression 06/26/2013   Pulmonary emboli ([place]) 11/11/2011   DM (diabetes mellitus), type 2, uncontrolled with complications ([place]) 84/53/6468    Resolved Hospital Problems  No resolved problems to display.    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  Leg abscess grew out Pseudomonas which patient had completed 9 days of IV antibiotics.  Patient admitted on 7/12 for sepsis secondary to superficial abscess of left thigh which have been previously drained by surgery.  Hospital Course:  Active Problems:   DM (diabetes mellitus), type 2, uncontrolled with complications ([place]): E3O at 15. 45 units of long-acting insulin.  Increase to 60.   Pulmonary emboli ([facility])   Anxiety and depression: Continue home medications.    HTN  (hypertension): Beta-blocker added to antihypertensives   I Infective endocarditis   Sepsis ([place]) secondary to infected PICC line in patient with history of infective endocarditis and recent leg abscess: Patient met criteria for sepsis on admission given tachycardia, tachypnea, leukocytosis.  Seen by infectious disease.  For fungemia, micafungin discontinued and patient put on IV Diflucan.  Discharged on p.o. Diflucan next 30 days and she will follow-up as outpatient.  PICC line removed and follow-up blood cultures negative so new PICC line placed with IV imipenem.  Plan will be for 14 days through 7/28.   Morbid obesity ([place]): Meets criteria BMI greater than 40    Stenosis of subclavian artery ([place]): Interventional radiology and attempting to place new PICC line noted severe stenosis of subclavian and recommended patient follow-up with them for consideration of angioplasty in the future once all infections are fully treated.  Patient notified and will consider   Procedures: Removal of previous PICC line New PICC line placement  Consultations: Infectious disease Interventional radiology General surgery  Discharge Exam: BP (!) 150/69 (BP Location: Left Arm)   Pulse (!) 57   Temp 98.1 F (36.7 C) (Oral)   Resp 16   Ht '5\' 6"'  (1.676 m)   Wt (!) 182.3 kg Comment: 01/03/21  SpO2 98%   BMI 64.87 kg/m   General: Alert and oriented x3, no acute distress Cardiovascular: Regular rate and rhythm, S1-S2 Respiratory: Clear to auscultation bilaterally  Discharge Instructions You were cared for by a hospitalist during your hospital stay. If you have any questions about your discharge medications or the care you received while you  were in the hospital after you are discharged, you can call the unit and asked to speak with the hospitalist on call if the hospitalist that took care of you is not available. Once you are discharged, your primary care physician will handle any further medical issues.  Please note that NO REFILLS for any discharge medications will be authorized once you are discharged, as it is imperative that you return to your primary care physician (or establish a relationship with a primary care physician if you do not have one) for your aftercare needs so that they can reassess your need for medications and monitor your lab values.  Discharge Instructions     [facility] pharmacist to adjust dose for Vancomycin, Aminoglycosides and other anti-infective therapies as requested by physician.   Complete by: As directed    [facility] to provide Cath Flo 47m   Complete by: As directed    Administer for PICC line occlusion and as ordered by physician for other access device issues.   Anaphylaxis Kit: Provided to treat any anaphylactic reaction to the medication being provided to the patient if First Dose or when requested by physician   Complete by: As directed    Epinephrine 151mml vial / amp: Administer 0.62m68m0.62ml49mubcutaneously once for moderate to severe anaphylaxis, nurse to call physician and pharmacy when reaction occurs and call 911 if needed for immediate care   Diphenhydramine 50mg75mIV vial: Administer 25-50mg 19mM PRN for first dose reaction, rash, itching, mild reaction, nurse to call physician and pharmacy when reaction occurs   Sodium Chloride 0.9% NS 500ml I90mdminister if needed for hypovolemic blood pressure drop or as ordered by physician after call to physician with anaphylactic reaction   Change dressing on IV access line weekly and PRN   Complete by: As directed    Diet - low sodium heart healthy   Complete by: As directed    Discharge wound care:   Complete by: As directed    Wet to dry dressing change 1-2 times a day   Flush IV access with Sodium Chloride 0.9% and Heparin 10 units/ml or 100 units/ml   Complete by: As directed    Home infusion instructions - [facility]   Complete by: As directed    Instructions:  Flush IV access with Sodium Chloride 0.9% and Heparin 10units/ml or 100units/ml   Change dressing on IV access line: Weekly and PRN   Instructions Cath Flo 2mg: Ad18mister for PICC Line occlusion and as ordered by physician for other access device   [facility] pharmacist to adjust dose for: Vancomycin, Aminoglycosides and other anti-infective therapies as requested by physician   Increase activity slowly   Complete by: As directed    Method of administration may be changed at the discretion of home infusion pharmacist based upon assessment of the patient and/or caregiver's ability to self-administer the medication ordered   Complete by: As directed       Allergies as of 02/09/2021       Reactions   Clindamycin Nausea And Vomiting   Zofran Itching, Nausea And Vomiting   Doxycycline Nausea And Vomiting   Morphine And Related Itching        Medication List     STOP taking these medications    insulin glargine 100 UNIT/ML injection Commonly known as: LANTUS Replaced by: insulin detemir 100 UNIT/ML injection   micafungin 100 MG Solr injection Commonly known as: MYCAMINE   micafungin 50 MG  injection Commonly known as: MYCAMINE   potassium chloride 10 MEQ tablet Commonly known as: KLOR-CON       TAKE these medications    acetaminophen 325 MG tablet Commonly known as: TYLENOL Take 2 tablets (650 mg total) by mouth every 6 (six) hours as needed for mild pain or moderate pain.   atorvastatin 40 MG tablet Commonly known as: LIPITOR Take 1 tablet (40 mg total) by mouth daily at 6 PM.   BIOTIN PO Take 1 Dose by mouth daily.   buPROPion 300 MG 24 hr tablet Commonly known as: WELLBUTRIN XL Take 300 mg by mouth daily. What changed: Another medication with the same name was removed. Continue taking this medication, and follow the directions you see here.   collagenase ointment Commonly known as: SANTYL Apply topically daily. To left leg   fluconazole 200 MG  tablet Commonly known as: DIFLUCAN Take 2 tablets (400 mg total) by mouth at bedtime.   furosemide 40 MG tablet Commonly known as: LASIX Take 40 mg by mouth daily.   Fusion Plus Caps Take 1 capsule by mouth daily.   glucose blood test strip Use as instructed   glucose monitoring kit monitoring kit 1 each by Does not apply route as needed for other.   HumaLOG KwikPen 100 UNIT/ML KwikPen Generic drug: insulin lispro Inject subcutaneously twice daily as per sliding scale: if 70-150= 0 units; 151-200= 2 u; 201-250= 4 u; 251-300= 6 u; 301-350= 8 u; 351-400= 10 u; 401-450= 12u; anything greater than 400 call physician.   hydrOXYzine 50 MG capsule Commonly known as: VISTARIL Take 50 mg by mouth every 6 (six) hours as needed for itching.   imipenem-cilastatin  IVPB Commonly known as: PRIMAXIN Inject 1,000 mg into the vein every 8 (eight) hours for 10 days. Indication:  VRE/acinetobacter bacteremia First Dose: Yes Last Day of Therapy:  02/17/21 Labs - Once weekly:  CBC/D and BMP, Labs - Every other week:  ESR and CRP Method of administration: Mini-Bag Plus / Gravity Method of administration may be changed at the discretion of home infusion pharmacist based upon assessment of the patient and/or caregiver's ability to self-administer the medication ordered.   insulin detemir 100 UNIT/ML injection Commonly known as: Levemir Inject 0.6 mLs (60 Units total) into the skin at bedtime. Replaces: insulin glargine 100 UNIT/ML injection   Janumet XR [phone] MG Tb24 Generic drug: SitaGLIPtin-MetFORMIN HCl Take 1 tablet by mouth daily.   lisinopril 2.5 MG tablet Commonly known as: ZESTRIL Take 2.5 mg by mouth daily.   metoCLOPramide 5 MG tablet Commonly known as: REGLAN Take 1 tablet (5 mg total) by mouth every 6 (six) hours as needed for nausea.   metoprolol tartrate 25 MG tablet Commonly known as: LOPRESSOR Take 0.5 tablets (12.5 mg total) by mouth 2 (two) times daily.    omeprazole 20 MG capsule Commonly known as: PRILOSEC Take 1 capsule by mouth daily.   oxyCODONE 15 MG immediate release tablet Commonly known as: ROXICODONE Take 1 tablet (15 mg total) by mouth every 6 (six) hours as needed for pain.   promethazine 25 MG tablet Commonly known as: PHENERGAN Take 25 mg by mouth 2 (two) times daily as needed for nausea/vomiting.   traZODone 50 MG tablet Commonly known as: DESYREL Take 25 mg by mouth at bedtime.   venlafaxine XR 75 MG 24 hr capsule Commonly known as: EFFEXOR-XR Take 75 mg by mouth daily.   Xarelto 20 MG Tabs tablet Generic drug: rivaroxaban Take 20 mg by  mouth every evening.               Discharge Care Instructions  (From admission, onward)           Start     Ordered   02/09/21 0000  Change dressing on IV access line weekly and PRN  (Home infusion instructions - [facility] )        02/09/21 1105   02/09/21 0000  Discharge wound care:       Comments: Wet to dry dressing change 1-2 times a day   02/09/21 1559           Allergies  Allergen Reactions   Clindamycin Nausea And Vomiting   Zofran Itching and Nausea And Vomiting   Doxycycline Nausea And Vomiting   Morphine And Related Itching    Follow-up Information     [place], [place] Follow up.   Specialty: [place] Why: [facility] nursing/physical therapy/occupational therapy Contact information: [address] [place] [address] [phone]         [facility] [place]. Go on 03/08/2021.   Specialty: General Surgery Why: 8:30 AM. Please arrive 30 min prior to appointment time. For wound re-check Contact information: [place] [address] [phone]                  The results of significant diagnostics from this hospitalization (including imaging, microbiology, ancillary and laboratory) are listed below for reference.    Significant Diagnostic  Studies: CT Angio Chest PE W and/or Wo Contrast  Result Date: 02/01/2021 CLINICAL DATA:  Weakness. History of pulmonary emboli. Infected wound of the lower extremity. EXAM: CT ANGIOGRAPHY CHEST WITH CONTRAST TECHNIQUE: Multidetector CT imaging of the chest was performed using the standard protocol during bolus administration of intravenous contrast. Multiplanar CT image reconstructions and MIPs were obtained to evaluate the vascular anatomy. CONTRAST:  134m OMNIPAQUE IOHEXOL 350 MG/ML SOLN COMPARISON:  Chest radiography earlier same day. Previous CT angiography 04/30/2008. FINDINGS: Cardiovascular: The heart is enlarged. No pericardial effusion. Coronary artery calcification is present. Aortic atherosclerotic calcification is present. Pulmonary arterial opacification is moderate. I think there is an embolus in the left upper lobe pulmonary arterial tree serving an area of abnormal lung described below. See coronal image 74. Mediastinum/Nodes: 15 x 22 mm ovoid lesion anterior to the trachea at the thoracic inlet, enlarged since 2009. This could be a lymph node or possibly a parathyroid adenoma. Few slightly enlarged nodes in the aortopulmonary window. Lungs/Pleura: No pleural effusion. Sub solid consolidation in the anterior aspect of the left upper lobe, the region of lung served by what appears to be an abnormal pulmonary artery probably affected by an embolus. Therefore, this probably represents an area of pulmonary infarction. Lipoid pneumonia was considered, but this would be a very atypical location for that process. Scattered patchy densities elsewhere within the lungs that could be atelectasis or mild patchy infiltrates. Upper Abdomen: Negative Musculoskeletal: No acute bone finding. Ordinary spinal degenerative changes. Review of the MIP images confirms the above findings. IMPRESSION: Pulmonary embolus demonstrated in the left upper lobe pulmonary arterial tree. This serves the anterior aspect of the  left upper lobe, which is affected by patchy density probably representing pulmonary infarction. Enlarging ovoid density anterior to the trachea at the thoracic inlet could be a lymph node or parathyroid adenoma. Aortic Atherosclerosis (ICD10-I70.0). Coronary artery calcification. Electronically Signed   By: MJan FiremanD.  On: 02/01/2021 15:39   CT FEMUR LEFT W CONTRAST  Result Date: 02/01/2021 CLINICAL DATA:  Infected wound at the distal thigh laterally EXAM: CT OF THE LOWER RIGHT EXTREMITY WITH CONTRAST TECHNIQUE: Multidetector CT imaging of the lower right extremity was performed according to the standard protocol following intravenous contrast administration. CONTRAST:  125m OMNIPAQUE IOHEXOL 350 MG/ML SOLN COMPARISON:  CT 12/26/2020 FINDINGS: Bones/Joint/Cartilage No acute fracture or dislocation. 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 Small-moderate left knee joint effusion, nonspecific. Mild degenerative changes of the left hip joint. No bony erosion or periostitis. Ligaments Suboptimally assessed by CT. Muscles and Tendons Fatty atrophy of the left thigh and visualized proximal calf musculature. No acute musculotendinous abnormality is identified. No intramuscular fluid collection. Soft tissues Prominent skin thickening with marked subcutaneous edema most pronounced at the medial aspect of the distal thigh. Similar findings are noted to the contralateral right side, partially included within the field of view. This is similar in appearance to the previous study. Portions of the lateral left thigh were not able to be entirely included within the field of view secondary to patient habitus. Previously seen ill-defined area of fluid and gas within the anterior aspect of the distal thigh has been debrided. There is a somewhat tubular shaped air cavity within the anterolateral soft tissues of the distal thigh near the site of prior surgical debridement likely reflecting prior surgical  tract (series 3, images 251-263). No organized or drainable fluid collections are identified. No additional sites of air or gas within the soft tissues. No left inguinal lymphadenopathy. Extensive vascular calcifications. IMPRESSION: 1. Previously seen ill-defined area of fluid and gas within the anterior aspect of the distal thigh has been surgically debrided. There is a somewhat tubular shaped air cavity within the anterolateral superficial soft tissues of the distal thigh at the surgical debridement site likely reflecting prior surgical incision site. No associated fluid collection to suggest abscess at this location. No additional sites of air or gas within the soft tissues. 2. Prominent skin thickening with marked subcutaneous edema most pronounced at the medial aspect of the distal thigh. Similar findings are noted to the contralateral right side, partially included within the field of view. Findings may reflect chronic lymphedema. 3. No acute osseous findings. 4. 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 Small-moderate left knee joint effusion, nonspecific. Electronically Signed   By: NDavina PokeD.O.   On: 02/01/2021 15:28   DG Chest Port 1 View  Result Date: 02/01/2021 CLINICAL DATA:  Hyperglycemia. EXAM: PORTABLE CHEST 1 VIEW COMPARISON:  09/06/2012 FINDINGS: Right-sided PICC line tip is in the mid SVC at the level of the carina. The cardiac silhouette, mediastinal and hilar contours are within normal limits given the AP projection and portable technique. Patchy left upper lobe airspace opacity suspicious for pneumonia. No pulmonary edema or pleural effusions. No pulmonary lesions. The bony thorax is intact. IMPRESSION: 1. Right-sided PICC line tip in the mid SVC. 2. Left upper lobe infiltrate. Electronically Signed   By: PMarijo SanesM.D.   On: 02/01/2021 13:41   ECHOCARDIOGRAM COMPLETE  Result Date: 02/05/2021    ECHOCARDIOGRAM REPORT   Patient Name:   Karen PINKUSDate  of Exam: 02/05/2021 Medical Rec #:  [id number]      Height:       66.0 in Accession #:    [id number]     Weight:       401.9 lb Date of Birth:  1August 15, 1977  BSA:          2.690 m Patient Age:    44 years        BP:           139/61 mmHg Patient Gender: F               HR:           65 bpm. Exam Location:  Inpatient Procedure: 2D Echo, Cardiac Doppler and Color Doppler Indications:    Abnormal ECG R94.31  History:        Patient has prior history of Echocardiogram examinations, most                 recent 01/03/2021. Risk Factors:Hypertension, Diabetes and                 Non-Smoker. PE. Infective endocarditis.  Sonographer:    Vickie Epley RDCS Referring Phys: [id number] Noland Fordyce MATHEWS  Sonographer Comments: Patient is morbidly obese. IMPRESSIONS  1. Left ventricular ejection fraction, by estimation, is 50 to 55%. The left ventricle has low normal function. The left ventricle demonstrates regional wall motion abnormalities (see scoring diagram/findings for description). The left ventricular internal cavity size was moderately dilated. Left ventricular diastolic parameters are consistent with Grade II diastolic dysfunction (pseudonormalization).  2. Right ventricular systolic function was not well visualized. The right ventricular size is not well visualized. There is mildly elevated pulmonary artery systolic pressure. The estimated right ventricular systolic pressure is 88.4 mmHg.  3. The mitral valve is normal in structure. No evidence of mitral valve regurgitation. No evidence of mitral stenosis.  4. Tricuspid valve regurgitation is moderate.  5. The aortic valve is normal in structure. Aortic valve regurgitation is not visualized. No aortic stenosis is present.  6. The inferior vena cava is dilated in size with <50% respiratory variability, suggesting right atrial pressure of 15 mmHg. FINDINGS  Left Ventricle: Left ventricular ejection fraction, by estimation, is 50 to 55%. The left ventricle has low normal  function. The left ventricle demonstrates regional wall motion abnormalities. The left ventricular internal cavity size was moderately dilated. There is no left ventricular hypertrophy. Left ventricular diastolic parameters are consistent with Grade II diastolic dysfunction (pseudonormalization).  LV Wall Scoring: The antero-lateral wall and apical lateral segment are hypokinetic. Right Ventricle: The right ventricular size is not well visualized. No increase in right ventricular wall thickness. Right ventricular systolic function was not well visualized. There is mildly elevated pulmonary artery systolic pressure. The tricuspid regurgitant velocity is 2.48 m/s, and with an assumed right atrial pressure of 15 mmHg, the estimated right ventricular systolic pressure is 16.6 mmHg. Left Atrium: Left atrial size was normal in size. Right Atrium: Right atrial size was normal in size. Pericardium: There is no evidence of pericardial effusion. Mitral Valve: The mitral valve is normal in structure. No evidence of mitral valve regurgitation. No evidence of mitral valve stenosis. Tricuspid Valve: The tricuspid valve is normal in structure. Tricuspid valve regurgitation is moderate . No evidence of tricuspid stenosis. Aortic Valve: The aortic valve is normal in structure. Aortic valve regurgitation is not visualized. No aortic stenosis is present. Pulmonic Valve: The pulmonic valve was normal in structure. Pulmonic valve regurgitation is not visualized. No evidence of pulmonic stenosis. Aorta: The aortic root is normal in size and structure. Venous: The inferior vena cava is dilated in size with less than 50% respiratory variability, suggesting right atrial pressure of 15 mmHg. IAS/Shunts: No atrial level shunt detected by  color flow Doppler.  LEFT VENTRICLE PLAX 2D LVIDd:         6.40 cm      Diastology LVIDs:         4.80 cm      LV e' medial:    5.59 cm/s LV PW:         0.80 cm      LV E/e' medial:  17.3 LV IVS:        0.80  cm      LV e' lateral:   7.22 cm/s LVOT diam:     2.10 cm      LV E/e' lateral: 13.4 LV SV:         77 LV SV Index:   29 LVOT Area:     3.46 cm  LV Volumes (MOD) LV vol d, MOD A2C: 161.0 ml LV vol d, MOD A4C: 200.0 ml LV vol s, MOD A2C: 73.8 ml LV vol s, MOD A4C: 93.3 ml LV SV MOD A2C:     87.2 ml LV SV MOD A4C:     200.0 ml LV SV MOD BP:      98.9 ml RIGHT VENTRICLE RV S prime:     9.32 cm/s TAPSE (M-mode): 1.7 cm LEFT ATRIUM             Index       RIGHT ATRIUM           Index LA diam:        3.90 cm 1.45 cm/m  RA Area:     18.80 cm LA Vol (A2C):   51.9 ml 19.29 ml/m RA Volume:   56.20 ml  20.89 ml/m LA Vol (A4C):   45.3 ml 16.84 ml/m LA Biplane Vol: 48.6 ml 18.06 ml/m  AORTIC VALVE LVOT Vmax:   99.80 cm/s LVOT Vmean:  71.000 cm/s LVOT VTI:    0.222 m  AORTA Ao Root diam: 3.00 cm MITRAL VALVE               TRICUSPID VALVE MV Area (PHT): 3.11 cm    TR Peak grad:   24.6 mmHg MV Decel Time: 244 msec    TR Vmax:        248.00 cm/s MR Peak grad: 100.8 mmHg MR Vmax:      502.00 cm/s  SHUNTS MV E velocity: 96.50 cm/s  Systemic VTI:  0.22 m MV A velocity: 62.10 cm/s  Systemic Diam: 2.10 cm MV E/A ratio:  1.55 Candee Furbish MD Electronically signed by Candee Furbish MD Signature Date/Time: 02/05/2021/11:59:06 AM    Final    IR PICC PLACEMENT RIGHT >5 YRS INC IMG GUIDE  Result Date: 02/09/2021 INDICATION: 45 year old female with history of endocarditis and left thigh abscess requiring central venous access for long-term antibiotics. 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 The PICC line was removed for a central line holiday given current bacteremia/fungemia. She presents today for PICC line replacement prior to discharge home. EXAM: ULTRASOUND AND FLUOROSCOPIC GUIDED PICC LINE INSERTION MEDICATIONS: None. CONTRAST:  None FLUOROSCOPY TIME:  2 minutes, 0 seconds (66 mGy) COMPLICATIONS: None  immediate. TECHNIQUE: The procedure, risks, benefits, and alternatives were explained to the patient and informed written consent was obtained. A timeout was performed prior to the initiation of the procedure. The right upper extremity was prepped with chlorhexidine in a sterile fashion, and a sterile drape was applied  covering the operative field. Maximum barrier sterile technique with sterile gowns and gloves were used for the procedure. A timeout was performed prior to the initiation of the procedure. Local anesthesia was provided with 1% lidocaine. Under direct ultrasound guidance, the basilic vein was accessed with a micropuncture kit after the overlying soft tissues were anesthetized with 1% lidocaine. After the overlying soft tissues were anesthetized, a small venotomy incision was created and a micropuncture kit was utilized to access the right basilic vein. Real-time ultrasound guidance was utilized for vascular access including the acquisition of a permanent ultrasound image documenting patency of the accessed vessel. A guidewire was advanced to the level of the superior caval-atrial junction for measurement purposes and the PICC line was cut to length. A peel-away sheath was placed and the catheter was inserted however unable to be passed beyond the central right subclavian vein. A V 18 wire was then placed through the catheter with the tip able to be positioned in the right atrium. Despite this stiffer wire, the PICC line would not track beyond the known right subclavian stenosis. Therefore, the 5 [place], dual lumen PICC was cut to 35 cm and was inserted to level of the mid right subclavian vein. A post procedure spot fluoroscopic was obtained. The catheter easily aspirated and flushed and was secured in place with stat lock device. A dressing was applied. The patient tolerated the procedure well without immediate post procedural complication. FINDINGS: Persistent central right subclavian vein stenosis.  Wires were able to be traversed through the stenosis, however the 5 French PICC was unable to be passed into the central veins. After catheter placement, the tip lies within the right subclavian vein the catheter aspirates and flushes normally and is ready for immediate use. IMPRESSION: Successful ultrasound and fluoroscopic guided placement of a right basilic vein approach, 35 cm, 5 French, dual lumen PICC with tip in the right subclavian vein. The PICC line is ready for immediate use. PLAN: Return in short order as an outpatient for right upper extremity venogram and right subclavian venoplasty with moderate sedation. The PICC line will be exchanged at this time to be appropriately positioned at the cavoatrial junction. Ruthann Cancer, MD [facility] Rad[facility] Electronically Signed   By: Ruthann Cancer MD   On: 02/09/2021 07:53   [place] EKG SITE RITE  Result Date: 02/06/2021 If Site Rite image not attached, placement could not be confirmed due to current cardiac rhythm.   Microbiology: Recent Results (from the past 240 hour(s))  Urine culture     Status: Abnormal   Collection Time: 02/01/21 12:33 PM   Specimen: In/Out Cath Urine  Result Value Ref Range Status   Specimen Description   Final    IN/OUT CATH URINE Performed at [facility], [place] [address], [place], [place] [address]    Special Requests   Final    NONE Performed at [facility], [place] [address], [place], [place] [address]    Culture >=100,000 COLONIES/mL KLEBSIELLA PNEUMONIAE (A)  Final   Report Status 02/04/2021 FINAL  Final   Organism ID, Bacteria KLEBSIELLA PNEUMONIAE (A)  Final      Susceptibility   Klebsiella pneumoniae - MIC*    AMPICILLIN >=32 RESISTANT Resistant     CEFAZOLIN <=4 SENSITIVE Sensitive     CEFEPIME <=0.12 SENSITIVE Sensitive     CEFTRIAXONE <=0.25 SENSITIVE Sensitive     CIPROFLOXACIN <=0.25 SENSITIVE Sensitive      GENTAMICIN <=1 SENSITIVE  Sensitive     IMIPENEM <=0.25 SENSITIVE Sensitive     NITROFURANTOIN 64 INTERMEDIATE Intermediate     TRIMETH/SULFA <=20 SENSITIVE Sensitive     AMPICILLIN/SULBACTAM 8 SENSITIVE Sensitive     PIP/TAZO <=4 SENSITIVE Sensitive     * >=100,000 COLONIES/mL KLEBSIELLA PNEUMONIAE  Resp Panel by RT-PCR (Flu A&B, Covid) Nasopharyngeal Swab     Status: None   Collection Time: 02/01/21  1:02 PM   Specimen: Nasopharyngeal Swab; Nasopharyngeal(NP) swabs in vial transport medium  Result Value Ref Range Status   SARS Coronavirus 2 by RT PCR NEGATIVE NEGATIVE Final    Comment: (NOTE) SARS-CoV-2 target nucleic acids are NOT DETECTED.  The SARS-CoV-2 RNA is generally detectable in upper respiratory specimens during the acute phase of infection. The lowest concentration of SARS-CoV-2 viral copies this assay can detect is 138 copies/mL. A negative result does not preclude SARS-Cov-2 infection and should not be used as the sole basis for treatment or other patient management decisions. A negative result may occur with  improper specimen collection/handling, submission of specimen other than nasopharyngeal swab, presence of viral mutation(s) within the areas targeted by this assay, and inadequate number of viral copies(<138 copies/mL). A negative result must be combined with clinical observations, patient history, and epidemiological information. The expected result is Negative.  Fact Sheet for Patients:  EntrepreneurPulse.com.au  Fact Sheet for Healthcare Providers:  IncredibleEmployment.be  This test is no t yet approved or cleared by the [place] FDA and  has been authorized for detection and/or diagnosis of SARS-CoV-2 by FDA under an Emergency Use Authorization (EUA). This EUA will remain  in effect (meaning this test can be used) for the duration of the COVID-19 declaration under Section 564(b)(1) of the Act, 21 U.S.C.section  360bbb-3(b)(1), unless the authorization is terminated  or revoked sooner.       Influenza A by PCR NEGATIVE NEGATIVE Final   Influenza B by PCR NEGATIVE NEGATIVE Final    Comment: (NOTE) The Xpert Xpress SARS-CoV-2/FLU/RSV plus assay is intended as an aid in the diagnosis of influenza from Nasopharyngeal swab specimens and should not be used as a sole basis for treatment. Nasal washings and aspirates are unacceptable for Xpert Xpress SARS-CoV-2/FLU/RSV testing.  Fact Sheet for Patients: EntrepreneurPulse.com.au  Fact Sheet for Healthcare Providers: IncredibleEmployment.be  This test is not yet approved or cleared by the [place] FDA and has been authorized for detection and/or diagnosis of SARS-CoV-2 by FDA under an Emergency Use Authorization (EUA). This EUA will remain in effect (meaning this test can be used) for the duration of the COVID-19 declaration under Section 564(b)(1) of the Act, 21 U.S.C. section 360bbb-3(b)(1), unless the authorization is terminated or revoked.  Performed at [facility], [place] [address], [place], [place] [address]   Blood culture (routine x 2)     Status: Abnormal   Collection Time: 02/01/21  1:40 PM   Specimen: BLOOD  Result Value Ref Range Status   Specimen Description   Final    BLOOD RIGHT ANTECUBITAL Performed at [facility] [address] [place], [place] [address]    Special Requests   Final    BOTTLES DRAWN AEROBIC AND ANAEROBIC Blood Culture results may not be optimal due to an inadequate volume of blood received in culture bottles Performed at [facility] [address], [place], [place] [address]    Culture  Setup Time   Final    GRAM POSITIVE COCCI IN PAIRS IN BOTH AEROBIC  AND ANAEROBIC BOTTLES GRAM NEGATIVE RODS AEROBIC BOTTLE ONLY CRITICAL VALUE NOTED.  VALUE IS CONSISTENT WITH PREVIOUSLY REPORTED AND CALLED VALUE.     Culture (A)  Final    ACINETOBACTER CALCOACETICUS/BAUMANNII COMPLEX ENTEROCOCCUS FAECALIS SUSCEPTIBILITIES PERFORMED ON PREVIOUS CULTURE WITHIN THE LAST 5 DAYS. Performed at [facility], [place] [address] [place], [place] [address]    Report Status 02/04/2021 FINAL  Final  Blood culture (routine x 2)     Status: Abnormal   Collection Time: 02/01/21  1:48 PM   Specimen: BLOOD RIGHT FOREARM  Result Value Ref Range Status   Specimen Description   Final    BLOOD RIGHT FOREARM Performed at [facility] [address], [place], [place] [address]    Special Requests   Final    BOTTLES DRAWN AEROBIC AND ANAEROBIC Blood Culture adequate volume Performed at [facility] [address] [place], [place] [address]    Culture  Setup Time   Final    GRAM POSITIVE COCCI IN PAIRS IN BOTH AEROBIC AND ANAEROBIC BOTTLES GRAM NEGATIVE RODS AEROBIC BOTTLE ONLY CRITICAL RESULT CALLED TO, READ BACK BY AND VERIFIED WITH: Gustavo Lah 0539 02/02/2021 Mena Goes Performed at [facility], [place] [address], [place], [place] [address]    Culture (A)  Final    ACINETOBACTER CALCOACETICUS/BAUMANNII COMPLEX VANCOMYCIN RESISTANT ENTEROCOCCUS    Report Status 02/04/2021 FINAL  Final   Organism ID, Bacteria ACINETOBACTER CALCOACETICUS/BAUMANNII COMPLEX  Final   Organism ID, Bacteria VANCOMYCIN RESISTANT ENTEROCOCCUS  Final      Susceptibility   Acinetobacter calcoaceticus/baumannii complex - MIC*    CEFTAZIDIME 16 INTERMEDIATE Intermediate     CIPROFLOXACIN 1 SENSITIVE Sensitive     GENTAMICIN <=1 SENSITIVE Sensitive     IMIPENEM <=0.25 SENSITIVE Sensitive     PIP/TAZO 32 INTERMEDIATE Intermediate     TRIMETH/SULFA <=20 SENSITIVE Sensitive     AMPICILLIN/SULBACTAM <=2 SENSITIVE Sensitive     * ACINETOBACTER CALCOACETICUS/BAUMANNII COMPLEX   Vancomycin resistant enterococcus - MIC*    AMPICILLIN <=2 SENSITIVE Sensitive     VANCOMYCIN >=32 RESISTANT  Resistant     GENTAMICIN SYNERGY SENSITIVE Sensitive     LINEZOLID 2 SENSITIVE Sensitive     * VANCOMYCIN RESISTANT ENTEROCOCCUS  Blood Culture ID Panel (Reflexed)     Status: Abnormal   Collection Time: 02/01/21  1:48 PM  Result Value Ref Range Status   Enterococcus faecalis DETECTED (A) NOT DETECTED Final    Comment: CRITICAL RESULT CALLED TO, READ BACK BY AND VERIFIED WITH: EMariah Milling 0411 02/02/2021 T. TYSOR    Enterococcus Faecium NOT DETECTED NOT DETECTED Final   Listeria monocytogenes NOT DETECTED NOT DETECTED Final   Staphylococcus species NOT DETECTED NOT DETECTED Final   Staphylococcus aureus (BCID) NOT DETECTED NOT DETECTED Final   Staphylococcus epidermidis NOT DETECTED NOT DETECTED Final   Staphylococcus lugdunensis NOT DETECTED NOT DETECTED Final   Streptococcus species NOT DETECTED NOT DETECTED Final   Streptococcus agalactiae NOT DETECTED NOT DETECTED Final   Streptococcus pneumoniae NOT DETECTED NOT DETECTED Final   Streptococcus pyogenes NOT DETECTED NOT DETECTED Final   A.calcoaceticus-baumannii DETECTED (A) NOT DETECTED Final    Comment: CRITICAL RESULT CALLED TO, READ BACK BY AND VERIFIED WITH: E. JACKSON,PHARMD 0411 02/02/2021 T. TYSOR    Bacteroides fragilis NOT DETECTED NOT DETECTED Final   Enterobacterales NOT DETECTED NOT DETECTED Final   Enterobacter cloacae complex NOT DETECTED NOT DETECTED Final   Escherichia coli NOT DETECTED NOT DETECTED Final  Klebsiella aerogenes NOT DETECTED NOT DETECTED Final   Klebsiella oxytoca NOT DETECTED NOT DETECTED Final   Klebsiella pneumoniae NOT DETECTED NOT DETECTED Final   Proteus species NOT DETECTED NOT DETECTED Final   Salmonella species NOT DETECTED NOT DETECTED Final   Serratia marcescens NOT DETECTED NOT DETECTED Final   Haemophilus influenzae NOT DETECTED NOT DETECTED Final   Neisseria meningitidis NOT DETECTED NOT DETECTED Final   Pseudomonas aeruginosa NOT DETECTED NOT DETECTED Final    Stenotrophomonas maltophilia NOT DETECTED NOT DETECTED Final   Candida albicans NOT DETECTED NOT DETECTED Final   Candida auris NOT DETECTED NOT DETECTED Final   Candida glabrata NOT DETECTED NOT DETECTED Final   Candida krusei NOT DETECTED NOT DETECTED Final   Candida parapsilosis NOT DETECTED NOT DETECTED Final   Candida tropicalis NOT DETECTED NOT DETECTED Final   Cryptococcus neoformans/gattii NOT DETECTED NOT DETECTED Final   CTX-M ESBL NOT DETECTED NOT DETECTED Final   Carbapenem resistance IMP NOT DETECTED NOT DETECTED Final   Carbapenem resistance KPC NOT DETECTED NOT DETECTED Final   Carbapenem resistance NDM NOT DETECTED NOT DETECTED Final   Vancomycin resistance DETECTED (A) NOT DETECTED Final    Comment: CRITICAL RESULT CALLED TO, READ BACK BY AND VERIFIED WITH: EMariah Milling 1448 02/02/2021 T. TYSOR    Carbapenem resistance VIM NOT DETECTED NOT DETECTED Final    Comment: Performed at [facility], [address], [place], [place] [address]  Aerobic/Anaerobic Culture w Gram Stain (surgical/deep wound)     Status: None   Collection Time: 02/01/21  1:49 PM   Specimen: Abscess  Result Value Ref Range Status   Specimen Description   Final    ABSCESS LEFT LEG Performed at [facility] [address], [place], [place] [address]    Special Requests   Final    Immunocompromised Performed at [facility], [place] [address], [place], [place] [address]    Gram Stain   Final    FEW WBC PRESENT,BOTH PMN AND MONONUCLEAR FEW GRAM NEGATIVE RODS    Culture   Final    MODERATE KLEBSIELLA PNEUMONIAE RARE PSEUDOMONAS AERUGINOSA NO ANAEROBES ISOLATED Performed at [facility], [address], [place], [place] [address]    Report Status 02/07/2021 FINAL  Final   Organism ID, Bacteria KLEBSIELLA PNEUMONIAE  Final   Organism ID, Bacteria PSEUDOMONAS AERUGINOSA  Final      Susceptibility   Klebsiella pneumoniae - MIC*    AMPICILLIN  >=32 RESISTANT Resistant     CEFAZOLIN <=4 SENSITIVE Sensitive     CEFEPIME <=0.12 SENSITIVE Sensitive     CEFTAZIDIME <=1 SENSITIVE Sensitive     CEFTRIAXONE <=0.25 SENSITIVE Sensitive     CIPROFLOXACIN <=0.25 SENSITIVE Sensitive     GENTAMICIN <=1 SENSITIVE Sensitive     IMIPENEM <=0.25 SENSITIVE Sensitive     TRIMETH/SULFA <=20 SENSITIVE Sensitive     AMPICILLIN/SULBACTAM 4 SENSITIVE Sensitive     PIP/TAZO <=4 SENSITIVE Sensitive     * MODERATE KLEBSIELLA PNEUMONIAE   Pseudomonas aeruginosa - MIC*    CEFTAZIDIME 4 SENSITIVE Sensitive     CIPROFLOXACIN <=0.25 SENSITIVE Sensitive     GENTAMICIN <=1 SENSITIVE Sensitive     IMIPENEM 1 SENSITIVE Sensitive     PIP/TAZO 8 SENSITIVE Sensitive     CEFEPIME 2 SENSITIVE Sensitive     * RARE PSEUDOMONAS AERUGINOSA  Culture, blood (Routine X 2) w Reflex to ID Panel     Status: None   Collection  Time: 02/03/21  8:43 PM   Specimen: BLOOD LEFT HAND  Result Value Ref Range Status   Specimen Description   Final    BLOOD LEFT HAND Blood Culture adequate volume BOTTLES DRAWN AEROBIC ONLY Performed at [facility] [address], [place], [place] [address]    Special Requests   Final    NONE Performed at [facility], [place] [address], [place], [place] [address]    Culture   Final    NO GROWTH 5 DAYS Performed at [facility], [place] [address] [place], [place] [address]    Report Status 02/08/2021 FINAL  Final  Culture, blood (Routine X 2) w Reflex to ID Panel     Status: None   Collection Time: 02/03/21  8:43 PM   Specimen: BLOOD RIGHT ARM  Result Value Ref Range Status   Specimen Description   Final    BLOOD RIGHT ARM Blood Culture adequate volume BOTTLES DRAWN AEROBIC ONLY Performed at [facility] [address] [place], [place] [address]    Special Requests   Final    NONE Performed at [facility], [place] [address], [place], [place] [address]     Culture   Final    NO GROWTH 5 DAYS Performed at [facility], [place] [address], [place], [place] [address]    Report Status 02/08/2021 FINAL  Final     Labs: Basic Metabolic Panel: Recent Labs  Lab 02/03/21 0340 02/05/21 0448 02/06/21 0525 02/07/21 0414 02/08/21 0424  NA 132* 137 138 135 138  K 3.4* 3.6 4.3 4.6 5.0  CL 102 104 106 106 106  CO2 '24 23 25 23 24  ' GLUCOSE 227* 108* 119* 128* 78  BUN 16 20 21* 19 18  CREATININE 1.04* 1.18* 1.08* 1.07* 1.11*  CALCIUM 8.4* 8.8* 9.0 8.6* 9.0   Liver Function Tests: Recent Labs  Lab 02/03/21 0340  AST 8*  ALT 7  ALKPHOS 55  BILITOT 0.4  PROT 6.4*  ALBUMIN 1.8*   No results for input(s): LIPASE, AMYLASE in the last 168 hours. No results for input(s): AMMONIA in the last 168 hours. CBC: Recent Labs  Lab 02/03/21 0340 02/04/21 0551 02/05/21 0448 02/06/21 0525 02/07/21 0414  WBC 10.9* 10.3 8.9 9.9 8.8  HGB 10.6* 10.0* 9.8* 9.9* 9.9*  HCT 32.6* 30.7* 31.8* 32.6* 32.7*  MCV 86.7 88.2 91.4 92.1 93.4  PLT 262 269 294 349 320   Cardiac Enzymes: Recent Labs  Lab 02/03/21 0340  CKTOTAL 22*   BNP: BNP (last 3 results) No results for input(s): BNP in the last 8760 hours.  ProBNP (last 3 results) No results for input(s): PROBNP in the last 8760 hours.  CBG: Recent Labs  Lab 02/08/21 1628 02/08/21 2107 02/09/21 0733 02/09/21 1146 02/09/21 1634  GLUCAP 124* 127* 123* 76 140*       Signed:  Annita Brod, MD [facility] 02/09/2021, 6:28 PM

## 2021-02-09 NOTE — TOC Transition Note (Signed)
Transition of Care [facility]) - CM/SW Discharge Note   Patient Details  Name: Karen Bennett MRN: [id number] Date of Birth: 03/06/1976  [facility] CM/SW Contact:  Darleene Cleaver, LCSW Phone Number: 02/09/2021, 4:15 PM   Clinical Narrative:     Patient will be going home with home health through [facility], and home IV antibiotics through [facility].  CSW signing off please reconsult with any other social work needs, home health agency has been notified of planned discharge.   Final next level of care: Home w Home Health Services Barriers to Discharge: Barriers Resolved   Patient Goals and CMS Choice Patient states their goals for this hospitalization and ongoing recovery are:: To return back home with home health services and IV antibiotics. CMS Medicare.gov Compare Post Acute Care list provided to:: Patient Choice offered to / list presented to : Patient  Discharge Placement                       Discharge Plan and Services   Discharge Planning Services: CM Consult                      HH Arranged: RN, PT, OT [facility] Agency: [facility] Date [facility] Agency Contacted: 02/09/21 Time HH Agency Contacted: 1614 Representative spoke with at [facility] Agency: Marylene Land  Social Determinants of Health (SDOH) Interventions     Readmission Risk Interventions Readmission Risk Prevention Plan 01/05/2021  Transportation Screening Complete  PCP or Specialist Appt within 5-7 Days Complete  Home Care Screening Complete  Some recent data might be hidden

## 2021-02-09 NOTE — Plan of Care (Signed)

## 2021-02-10 ENCOUNTER — Telehealth (HOSPITAL_COMMUNITY): Payer: Self-pay | Admitting: Radiology

## 2021-02-10 ENCOUNTER — Other Ambulatory Visit (HOSPITAL_COMMUNITY): Payer: Self-pay | Admitting: Interventional Radiology

## 2021-02-10 DIAGNOSIS — I771 Stricture of artery: Secondary | ICD-10-CM

## 2021-02-10 NOTE — Telephone Encounter (Signed)
Tried to call pt to scheduled procedure with Dr. Elby Showers. No answer and no VM. JM

## 2021-02-11 ENCOUNTER — Telehealth (HOSPITAL_COMMUNITY): Payer: Self-pay | Admitting: Radiology

## 2021-02-11 NOTE — Telephone Encounter (Signed)
Tried to call pt to schedule procedure with Dr. Elby Showers. No answer and no VM. JM

## 2021-02-15 ENCOUNTER — Inpatient Hospital Stay: Payer: Medicare Other | Admitting: Internal Medicine

## 2021-02-15 ENCOUNTER — Telehealth: Payer: Self-pay | Admitting: *Deleted

## 2021-02-15 NOTE — Telephone Encounter (Signed)
Called patient to see if she was on her way to today's visit; would offer virtual visit if she was available. Call was not answered, no voicemail. RN unable to leave a message asking patient to reschedule today's missed visit. Andree Coss, RN

## 2021-02-17 ENCOUNTER — Telehealth: Payer: Self-pay | Admitting: Internal Medicine

## 2021-02-17 ENCOUNTER — Other Ambulatory Visit (HOSPITAL_COMMUNITY): Payer: Self-pay

## 2021-02-17 ENCOUNTER — Telehealth: Payer: Self-pay

## 2021-02-17 DIAGNOSIS — T80211D Bloodstream infection due to central venous catheter, subsequent encounter: Secondary | ICD-10-CM

## 2021-02-17 DIAGNOSIS — B376 Candidal endocarditis: Secondary | ICD-10-CM

## 2021-02-17 MED ORDER — LINEZOLID 600 MG PO TABS: 600.0000 mg | ORAL_TABLET | Freq: Two times a day (BID) | ORAL | 0 refills | Status: DC

## 2021-02-17 MED ORDER — AMOXICILLIN 500 MG PO TABS: 1000.0000 mg | ORAL_TABLET | Freq: Three times a day (TID) | ORAL | 0 refills | Status: AC

## 2021-02-17 NOTE — Telephone Encounter (Signed)
I called the patient but no one answered and she did not have a voicemail.

## 2021-02-17 NOTE — Telephone Encounter (Signed)
HH called   The last week picc dislodged 5 cm out. 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

## 2021-02-17 NOTE — Telephone Encounter (Signed)
Received call from patient's home health nurse, she states patient's PICC line is out about 5cm and there is no blood return. She states the line flushes fine. Patient is also complaining of diarrhea. Patient no-showed 02/15/21 appointment with Dr. Drue Second and has not been seen in clinic yet. Advised home health nurse to page on call provider in regards to PICC line being out.   Sandie Ano, RN

## 2021-02-17 NOTE — Telephone Encounter (Signed)
Received follow up call from [place] with [facility]. She spoke with Dr. Renold Don who told her the PICC needed to be replaced. She believed the abx end date was today however after speaking with the patient she was infusing them BID instead of the prescribed Q8 hours.   RN asked that she contact Dr. Renold Don to inform him of this as we haven't seen her in the office yet.   Vannie Hochstetler Loyola Mast, RN

## 2021-02-18 NOTE — Telephone Encounter (Signed)
Gave verbal orders to [facility] to pull PICC per Dr. Renold Don. Orders repeated and verified.   Spoke with patient, advised her that PICC line will be removed and she is being switched from IV antibiotics to oral antibiotic called Augmentin. Advised her it was sent to pharmacy and to pick this up only.   Instructed her that Dr. Renold Don would like her to continue with fluconazole until she is seen in our office. Patient verbalized understanding and has no further questions.   Patient scheduled for follow up with Dr. Luciana Axe 8/9.  Asked patient to please call back with any further questions.   Home health RN Tresa Endo: [phone]  Sandie Ano, RN

## 2021-02-22 ENCOUNTER — Encounter: Payer: Medicare Other | Admitting: Thoracic Surgery (Cardiothoracic Vascular Surgery)

## 2021-02-24 ENCOUNTER — Telehealth: Payer: Self-pay

## 2021-02-24 NOTE — Telephone Encounter (Signed)
Home Health nurse Saunders Glance [phone]) with [facility] called stating she saw the patient today and patient complained of redness and swelling in her right stump. She told Karen Bennett she noticed the redness and swelling a day ago. Patient has missed her hospital follow up appointment with our office and has not been seen here yet. I have advised Karen Bennett to instruct the patient to go to the ER. Karen Bennett verbalized understanding.  Karen Bennett

## 2021-03-01 ENCOUNTER — Inpatient Hospital Stay: Payer: Medicaid Other | Admitting: Internal Medicine

## 2021-03-10 ENCOUNTER — Encounter: Payer: Medicare Other | Admitting: Thoracic Surgery (Cardiothoracic Vascular Surgery)

## 2021-03-11 ENCOUNTER — Encounter: Payer: Medicare Other | Admitting: Thoracic Surgery (Cardiothoracic Vascular Surgery)

## 2021-03-16 ENCOUNTER — Telehealth: Payer: Self-pay | Admitting: *Deleted

## 2021-03-16 NOTE — Telephone Encounter (Signed)
left vm for pt to return call to r/s appt missed from 8/19

## 2021-06-09 ENCOUNTER — Ambulatory Visit: Payer: Medicare Other | Admitting: Internal Medicine

## 2021-09-03 ENCOUNTER — Inpatient Hospital Stay: Payer: Self-pay

## 2021-09-03 ENCOUNTER — Encounter (HOSPITAL_COMMUNITY)
Admission: EM | Disposition: A | Discharge status: Home Health | Payer: Self-pay | Source: Home / Self Care | Attending: Family Medicine

## 2021-09-03 ENCOUNTER — Inpatient Hospital Stay (HOSPITAL_COMMUNITY): Payer: Medicare Other

## 2021-09-03 ENCOUNTER — Inpatient Hospital Stay (HOSPITAL_COMMUNITY): Payer: Medicare Other | Admitting: Certified Registered"

## 2021-09-03 ENCOUNTER — Emergency Department (HOSPITAL_COMMUNITY): Payer: Medicare Other

## 2021-09-03 ENCOUNTER — Encounter (HOSPITAL_COMMUNITY): Payer: Self-pay

## 2021-09-03 ENCOUNTER — Inpatient Hospital Stay (HOSPITAL_COMMUNITY)
Admission: EM | Admit: 2021-09-03 | Discharge: 2021-09-13 | DRG: 853 | Disposition: A | Discharge status: Home Health | Payer: Medicare Other | Attending: Family Medicine | Admitting: Family Medicine

## 2021-09-03 ENCOUNTER — Other Ambulatory Visit: Payer: Self-pay

## 2021-09-03 DIAGNOSIS — F17200 Nicotine dependence, unspecified, uncomplicated: Secondary | ICD-10-CM | POA: Diagnosis present

## 2021-09-03 DIAGNOSIS — N151 Renal and perinephric abscess: Secondary | ICD-10-CM | POA: Diagnosis present

## 2021-09-03 DIAGNOSIS — L89152 Pressure ulcer of sacral region, stage 2: Secondary | ICD-10-CM | POA: Diagnosis present

## 2021-09-03 DIAGNOSIS — I10 Essential (primary) hypertension: Secondary | ICD-10-CM

## 2021-09-03 DIAGNOSIS — Z91199 Patient's noncompliance with other medical treatment and regimen due to unspecified reason: Secondary | ICD-10-CM | POA: Diagnosis not present

## 2021-09-03 DIAGNOSIS — B3789 Other sites of candidiasis: Secondary | ICD-10-CM | POA: Diagnosis not present

## 2021-09-03 DIAGNOSIS — B372 Candidiasis of skin and nail: Secondary | ICD-10-CM | POA: Diagnosis present

## 2021-09-03 DIAGNOSIS — E781 Pure hyperglyceridemia: Secondary | ICD-10-CM | POA: Diagnosis present

## 2021-09-03 DIAGNOSIS — B377 Candidal sepsis: Principal | ICD-10-CM | POA: Diagnosis present

## 2021-09-03 DIAGNOSIS — N179 Acute kidney failure, unspecified: Secondary | ICD-10-CM | POA: Diagnosis not present

## 2021-09-03 DIAGNOSIS — K5900 Constipation, unspecified: Secondary | ICD-10-CM | POA: Diagnosis present

## 2021-09-03 DIAGNOSIS — Z888 Allergy status to other drugs, medicaments and biological substances status: Secondary | ICD-10-CM

## 2021-09-03 DIAGNOSIS — E1142 Type 2 diabetes mellitus with diabetic polyneuropathy: Secondary | ICD-10-CM | POA: Diagnosis present

## 2021-09-03 DIAGNOSIS — R109 Unspecified abdominal pain: Secondary | ICD-10-CM | POA: Diagnosis not present

## 2021-09-03 DIAGNOSIS — I700 Atherosclerosis of aorta: Secondary | ICD-10-CM | POA: Diagnosis present

## 2021-09-03 DIAGNOSIS — Z91128 Patient's intentional underdosing of medication regimen for other reason: Secondary | ICD-10-CM | POA: Diagnosis not present

## 2021-09-03 DIAGNOSIS — L89102 Pressure ulcer of unspecified part of back, stage 2: Secondary | ICD-10-CM | POA: Diagnosis present

## 2021-09-03 DIAGNOSIS — Z86718 Personal history of other venous thrombosis and embolism: Secondary | ICD-10-CM | POA: Diagnosis not present

## 2021-09-03 DIAGNOSIS — R739 Hyperglycemia, unspecified: Secondary | ICD-10-CM

## 2021-09-03 DIAGNOSIS — L89312 Pressure ulcer of right buttock, stage 2: Secondary | ICD-10-CM | POA: Diagnosis present

## 2021-09-03 DIAGNOSIS — N201 Calculus of ureter: Secondary | ICD-10-CM | POA: Diagnosis present

## 2021-09-03 DIAGNOSIS — E785 Hyperlipidemia, unspecified: Secondary | ICD-10-CM | POA: Diagnosis present

## 2021-09-03 DIAGNOSIS — Z720 Tobacco use: Secondary | ICD-10-CM | POA: Diagnosis present

## 2021-09-03 DIAGNOSIS — T508X5A Adverse effect of diagnostic agents, initial encounter: Secondary | ICD-10-CM | POA: Diagnosis not present

## 2021-09-03 DIAGNOSIS — R7881 Bacteremia: Secondary | ICD-10-CM | POA: Diagnosis not present

## 2021-09-03 DIAGNOSIS — I152 Hypertension secondary to endocrine disorders: Secondary | ICD-10-CM | POA: Diagnosis present

## 2021-09-03 DIAGNOSIS — N136 Pyonephrosis: Secondary | ICD-10-CM | POA: Diagnosis present

## 2021-09-03 DIAGNOSIS — E871 Hypo-osmolality and hyponatremia: Secondary | ICD-10-CM | POA: Diagnosis present

## 2021-09-03 DIAGNOSIS — Z86711 Personal history of pulmonary embolism: Secondary | ICD-10-CM

## 2021-09-03 DIAGNOSIS — Z79899 Other long term (current) drug therapy: Secondary | ICD-10-CM

## 2021-09-03 DIAGNOSIS — I890 Lymphedema, not elsewhere classified: Secondary | ICD-10-CM | POA: Diagnosis present

## 2021-09-03 DIAGNOSIS — E1151 Type 2 diabetes mellitus with diabetic peripheral angiopathy without gangrene: Secondary | ICD-10-CM | POA: Diagnosis not present

## 2021-09-03 DIAGNOSIS — F32A Depression, unspecified: Secondary | ICD-10-CM | POA: Diagnosis present

## 2021-09-03 DIAGNOSIS — Z885 Allergy status to narcotic agent status: Secondary | ICD-10-CM

## 2021-09-03 DIAGNOSIS — A419 Sepsis, unspecified organism: Secondary | ICD-10-CM

## 2021-09-03 DIAGNOSIS — Z9114 Patient's other noncompliance with medication regimen: Secondary | ICD-10-CM | POA: Diagnosis not present

## 2021-09-03 DIAGNOSIS — Z20822 Contact with and (suspected) exposure to covid-19: Secondary | ICD-10-CM | POA: Diagnosis present

## 2021-09-03 DIAGNOSIS — I708 Atherosclerosis of other arteries: Secondary | ICD-10-CM | POA: Diagnosis present

## 2021-09-03 DIAGNOSIS — Z7901 Long term (current) use of anticoagulants: Secondary | ICD-10-CM

## 2021-09-03 DIAGNOSIS — Z89511 Acquired absence of right leg below knee: Secondary | ICD-10-CM | POA: Diagnosis not present

## 2021-09-03 DIAGNOSIS — Z87440 Personal history of urinary (tract) infections: Secondary | ICD-10-CM

## 2021-09-03 DIAGNOSIS — F419 Anxiety disorder, unspecified: Secondary | ICD-10-CM | POA: Diagnosis present

## 2021-09-03 DIAGNOSIS — E1100 Type 2 diabetes mellitus with hyperosmolarity without nonketotic hyperglycemic-hyperosmolar coma (NKHHC): Secondary | ICD-10-CM | POA: Diagnosis present

## 2021-09-03 DIAGNOSIS — N390 Urinary tract infection, site not specified: Secondary | ICD-10-CM | POA: Diagnosis not present

## 2021-09-03 DIAGNOSIS — N1411 Contrast-induced nephropathy: Secondary | ICD-10-CM | POA: Diagnosis not present

## 2021-09-03 DIAGNOSIS — Z6841 Body Mass Index (BMI) 40.0 and over, adult: Secondary | ICD-10-CM | POA: Diagnosis not present

## 2021-09-03 DIAGNOSIS — N19 Unspecified kidney failure: Secondary | ICD-10-CM

## 2021-09-03 DIAGNOSIS — N1831 Chronic kidney disease, stage 3a: Secondary | ICD-10-CM | POA: Insufficient documentation

## 2021-09-03 DIAGNOSIS — N289 Disorder of kidney and ureter, unspecified: Secondary | ICD-10-CM

## 2021-09-03 DIAGNOSIS — Z794 Long term (current) use of insulin: Secondary | ICD-10-CM

## 2021-09-03 DIAGNOSIS — Z883 Allergy status to other anti-infective agents status: Secondary | ICD-10-CM

## 2021-09-03 DIAGNOSIS — E1169 Type 2 diabetes mellitus with other specified complication: Secondary | ICD-10-CM | POA: Diagnosis present

## 2021-09-03 DIAGNOSIS — N184 Chronic kidney disease, stage 4 (severe): Secondary | ICD-10-CM | POA: Insufficient documentation

## 2021-09-03 DIAGNOSIS — R112 Nausea with vomiting, unspecified: Secondary | ICD-10-CM

## 2021-09-03 HISTORY — PX: CYSTOSCOPY W/ URETERAL STENT PLACEMENT: SHX1429

## 2021-09-03 HISTORY — DX: Acute and subacute infective endocarditis: I33.0

## 2021-09-03 LAB — GLUCOSE, CAPILLARY
Glucose-Capillary: 132 mg/dL — ABNORMAL HIGH (ref 70–99)
Glucose-Capillary: 168 mg/dL — ABNORMAL HIGH (ref 70–99)
Glucose-Capillary: 173 mg/dL — ABNORMAL HIGH (ref 70–99)
Glucose-Capillary: 173 mg/dL — ABNORMAL HIGH (ref 70–99)
Glucose-Capillary: 175 mg/dL — ABNORMAL HIGH (ref 70–99)
Glucose-Capillary: 183 mg/dL — ABNORMAL HIGH (ref 70–99)

## 2021-09-03 LAB — CBC WITH DIFFERENTIAL/PLATELET
Abs Immature Granulocytes: 0.06 10*3/uL (ref 0.00–0.07)
Basophils Absolute: 0 10*3/uL (ref 0.0–0.1)
Basophils Relative: 0 %
Eosinophils Absolute: 0 10*3/uL (ref 0.0–0.5)
Eosinophils Relative: 0 %
HCT: 38.7 % (ref 36.0–46.0)
Hemoglobin: 12.9 g/dL (ref 12.0–15.0)
Immature Granulocytes: 1 %
Lymphocytes Relative: 4 %
Lymphs Abs: 0.5 10*3/uL — ABNORMAL LOW (ref 0.7–4.0)
MCH: 28.4 pg (ref 26.0–34.0)
MCHC: 33.3 g/dL (ref 30.0–36.0)
MCV: 85.2 fL (ref 80.0–100.0)
Monocytes Absolute: 0.3 10*3/uL (ref 0.1–1.0)
Monocytes Relative: 2 %
Neutro Abs: 12.1 10*3/uL — ABNORMAL HIGH (ref 1.7–7.7)
Neutrophils Relative %: 93 %
Platelets: 322 10*3/uL (ref 150–400)
RBC: 4.54 MIL/uL (ref 3.87–5.11)
RDW: 12.7 % (ref 11.5–15.5)
WBC: 13 10*3/uL — ABNORMAL HIGH (ref 4.0–10.5)
nRBC: 0 % (ref 0.0–0.2)

## 2021-09-03 LAB — BASIC METABOLIC PANEL
Anion gap: 11 (ref 5–15)
BUN: 19 mg/dL (ref 6–20)
CO2: 22 mmol/L (ref 22–32)
Calcium: 8.5 mg/dL — ABNORMAL LOW (ref 8.9–10.3)
Chloride: 94 mmol/L — ABNORMAL LOW (ref 98–111)
Creatinine, Ser: 1.31 mg/dL — ABNORMAL HIGH (ref 0.44–1.00)
GFR, Estimated: 52 mL/min — ABNORMAL LOW (ref >60)
Glucose, Bld: 156 mg/dL — ABNORMAL HIGH (ref 70–99)
Potassium: 4 mmol/L (ref 3.5–5.1)
Sodium: 127 mmol/L — ABNORMAL LOW (ref 135–145)

## 2021-09-03 LAB — CBG MONITORING, ED
Glucose-Capillary: >600 mg/dL (ref 70–99)
Glucose-Capillary: >600 mg/dL (ref 70–99)
Glucose-Capillary: >600 mg/dL (ref 70–99)
Glucose-Capillary: 519 mg/dL (ref 70–99)
Glucose-Capillary: 466 mg/dL — ABNORMAL HIGH (ref 70–99)
Glucose-Capillary: 441 mg/dL — ABNORMAL HIGH (ref 70–99)
Glucose-Capillary: 385 mg/dL — ABNORMAL HIGH (ref 70–99)
Glucose-Capillary: 278 mg/dL — ABNORMAL HIGH (ref 70–99)
Glucose-Capillary: 254 mg/dL — ABNORMAL HIGH (ref 70–99)

## 2021-09-03 LAB — BLOOD GAS, VENOUS
Acid-base deficit: 2.7 mmol/L — ABNORMAL HIGH (ref 0.0–2.0)
Bicarbonate: 22.4 mmol/L (ref 20.0–28.0)
O2 Saturation: 61.4 %
Patient temperature: 98.6
pCO2, Ven: 42.4 mmHg — ABNORMAL LOW (ref 44.0–60.0)
pH, Ven: 7.343 (ref 7.250–7.430)
pO2, Ven: 33.7 mmHg (ref 32.0–45.0)

## 2021-09-03 LAB — I-STAT BETA HCG BLOOD, ED (MC, WL, AP ONLY): I-stat hCG, quantitative: 8.9 m[IU]/mL — ABNORMAL HIGH (ref <5)

## 2021-09-03 LAB — URINALYSIS, ROUTINE W REFLEX MICROSCOPIC
Bilirubin Urine: NEGATIVE
Glucose, UA: >=500 mg/dL — AB
Ketones, ur: 5 mg/dL — AB
Nitrite: NEGATIVE
Protein, ur: >=300 mg/dL — AB
Specific Gravity, Urine: 1.022 (ref 1.005–1.030)
WBC, UA: >50 WBC/hpf — ABNORMAL HIGH (ref 0–5)
pH: 5 (ref 5.0–8.0)

## 2021-09-03 LAB — COMPREHENSIVE METABOLIC PANEL
ALT: 10 U/L (ref 0–44)
AST: 15 U/L (ref 15–41)
Albumin: 2.1 g/dL — ABNORMAL LOW (ref 3.5–5.0)
Alkaline Phosphatase: 91 U/L (ref 38–126)
Anion gap: 13 (ref 5–15)
BUN: 19 mg/dL (ref 6–20)
CO2: 20 mmol/L — ABNORMAL LOW (ref 22–32)
Calcium: 8.5 mg/dL — ABNORMAL LOW (ref 8.9–10.3)
Chloride: 90 mmol/L — ABNORMAL LOW (ref 98–111)
Creatinine, Ser: 1.06 mg/dL — ABNORMAL HIGH (ref 0.44–1.00)
GFR, Estimated: >60 mL/min (ref >60)
Glucose, Bld: 603 mg/dL (ref 70–99)
Potassium: 4 mmol/L (ref 3.5–5.1)
Sodium: 123 mmol/L — ABNORMAL LOW (ref 135–145)
Total Bilirubin: 0.9 mg/dL (ref 0.3–1.2)
Total Protein: 7.5 g/dL (ref 6.5–8.1)

## 2021-09-03 LAB — PREGNANCY, URINE: Preg Test, Ur: NEGATIVE

## 2021-09-03 LAB — HEMOGLOBIN A1C
Hgb A1c MFr Bld: 14.6 % — ABNORMAL HIGH (ref 4.8–5.6)
Mean Plasma Glucose: 372.32 mg/dL

## 2021-09-03 LAB — RESP PANEL BY RT-PCR (FLU A&B, COVID) ARPGX2
Influenza A by PCR: NEGATIVE
Influenza B by PCR: NEGATIVE
SARS Coronavirus 2 by RT PCR: NEGATIVE

## 2021-09-03 LAB — MRSA NEXT GEN BY PCR, NASAL: MRSA by PCR Next Gen: NOT DETECTED

## 2021-09-03 LAB — LACTIC ACID, PLASMA
Lactic Acid, Venous: 3.9 mmol/L (ref 0.5–1.9)
Lactic Acid, Venous: 2.9 mmol/L (ref 0.5–1.9)
Lactic Acid, Venous: 2.5 mmol/L (ref 0.5–1.9)

## 2021-09-03 LAB — LIPASE, BLOOD: Lipase: 44 U/L (ref 11–51)

## 2021-09-03 LAB — BETA-HYDROXYBUTYRIC ACID: Beta-Hydroxybutyric Acid: 1.33 mmol/L — ABNORMAL HIGH (ref 0.05–0.27)

## 2021-09-03 LAB — OSMOLALITY: Osmolality: 300 mOsm/kg — ABNORMAL HIGH (ref 275–295)

## 2021-09-03 SURGERY — CYSTOSCOPY, WITH RETROGRADE PYELOGRAM AND URETERAL STENT INSERTION
Anesthesia: General | Site: Ureter | Laterality: Right

## 2021-09-03 MED ORDER — RIVAROXABAN 10 MG PO TABS
20.0000 mg | ORAL_TABLET | Freq: Every evening | ORAL | Status: DC
  Administered 2021-09-03 – 2021-09-13 (×11): 20 mg via ORAL
  Filled 2021-09-03: qty 1
  Filled 2021-09-03 (×5): qty 2
  Filled 2021-09-03: qty 1
  Filled 2021-09-03 (×4): qty 2

## 2021-09-03 MED ORDER — DEXTROSE 50 % IV SOLN: 0.0000 mL | INTRAVENOUS | Status: DC | PRN

## 2021-09-03 MED ORDER — ROCURONIUM BROMIDE 10 MG/ML (PF) SYRINGE
PREFILLED_SYRINGE | INTRAVENOUS | Status: AC
  Filled 2021-09-03: qty 10

## 2021-09-03 MED ORDER — 0.9 % SODIUM CHLORIDE (POUR BTL) OPTIME
TOPICAL | Status: DC | PRN
  Administered 2021-09-03: 1000 mL

## 2021-09-03 MED ORDER — STERILE WATER FOR IRRIGATION IR SOLN
Status: DC | PRN
  Administered 2021-09-03: 3000 mL

## 2021-09-03 MED ORDER — SODIUM CHLORIDE 0.9 % IV SOLN: INTRAVENOUS | Status: DC | PRN

## 2021-09-03 MED ORDER — SODIUM CHLORIDE 0.9 % IV SOLN
2.0000 g | Freq: Once | INTRAVENOUS | Status: AC
  Administered 2021-09-03: 2 g via INTRAVENOUS
  Filled 2021-09-03: qty 2

## 2021-09-03 MED ORDER — SODIUM CHLORIDE (PF) 0.9 % IJ SOLN
INTRAMUSCULAR | Status: AC
  Filled 2021-09-03: qty 50

## 2021-09-03 MED ORDER — DIAZEPAM 5 MG PO TABS
5.0000 mg | ORAL_TABLET | Freq: Three times a day (TID) | ORAL | Status: DC | PRN
  Administered 2021-09-05 – 2021-09-08 (×4): 5 mg via ORAL
  Filled 2021-09-03 (×2): qty 1
  Filled 2021-09-03: qty 3
  Filled 2021-09-03: qty 1

## 2021-09-03 MED ORDER — SUCCINYLCHOLINE CHLORIDE 200 MG/10ML IV SOSY
PREFILLED_SYRINGE | INTRAVENOUS | Status: DC | PRN
  Administered 2021-09-03: 200 mg via INTRAVENOUS

## 2021-09-03 MED ORDER — TRAZODONE HCL 50 MG PO TABS
150.0000 mg | ORAL_TABLET | Freq: Every day | ORAL | Status: DC
  Administered 2021-09-03 – 2021-09-12 (×10): 150 mg via ORAL
  Filled 2021-09-03 (×10): qty 3

## 2021-09-03 MED ORDER — POTASSIUM CHLORIDE 10 MEQ/100ML IV SOLN
10.0000 meq | INTRAVENOUS | Status: AC
  Administered 2021-09-03 (×2): 10 meq via INTRAVENOUS
  Filled 2021-09-03 (×2): qty 100

## 2021-09-03 MED ORDER — SUCCINYLCHOLINE CHLORIDE 200 MG/10ML IV SOSY
PREFILLED_SYRINGE | INTRAVENOUS | Status: AC
  Filled 2021-09-03: qty 10

## 2021-09-03 MED ORDER — OXYCODONE HCL 5 MG PO TABS
15.0000 mg | ORAL_TABLET | Freq: Four times a day (QID) | ORAL | Status: DC | PRN
  Administered 2021-09-04: 15 mg via ORAL
  Filled 2021-09-03: qty 3

## 2021-09-03 MED ORDER — ACETAMINOPHEN 500 MG PO TABS
1000.0000 mg | ORAL_TABLET | Freq: Four times a day (QID) | ORAL | Status: DC | PRN
  Administered 2021-09-03 – 2021-09-04 (×2): 1000 mg via ORAL
  Filled 2021-09-03 (×3): qty 2

## 2021-09-03 MED ORDER — DROPERIDOL 2.5 MG/ML IJ SOLN
INTRAMUSCULAR | Status: AC
  Filled 2021-09-03: qty 2

## 2021-09-03 MED ORDER — LACTATED RINGERS IV SOLN: INTRAVENOUS | Status: DC

## 2021-09-03 MED ORDER — METOPROLOL TARTRATE 25 MG PO TABS
25.0000 mg | ORAL_TABLET | Freq: Once | ORAL | Status: AC
  Administered 2021-09-03: 25 mg via ORAL
  Filled 2021-09-03: qty 1

## 2021-09-03 MED ORDER — LISINOPRIL 2.5 MG PO TABS
2.5000 mg | ORAL_TABLET | Freq: Once | ORAL | Status: AC
  Administered 2021-09-03: 2.5 mg via ORAL
  Filled 2021-09-03: qty 1

## 2021-09-03 MED ORDER — LACTATED RINGERS IV BOLUS
1000.0000 mL | Freq: Once | INTRAVENOUS | Status: AC
  Administered 2021-09-03: 1000 mL via INTRAVENOUS

## 2021-09-03 MED ORDER — PHENYLEPHRINE 40 MCG/ML (10ML) SYRINGE FOR IV PUSH (FOR BLOOD PRESSURE SUPPORT)
PREFILLED_SYRINGE | INTRAVENOUS | Status: DC | PRN
  Administered 2021-09-03: 120 ug via INTRAVENOUS

## 2021-09-03 MED ORDER — PROMETHAZINE HCL 25 MG PO TABS
25.0000 mg | ORAL_TABLET | Freq: Once | ORAL | Status: AC
  Administered 2021-09-03: 25 mg via ORAL
  Filled 2021-09-03: qty 1

## 2021-09-03 MED ORDER — IOHEXOL 300 MG/ML  SOLN
100.0000 mL | Freq: Once | INTRAMUSCULAR | Status: AC | PRN
  Administered 2021-09-03: 100 mL via INTRAVENOUS

## 2021-09-03 MED ORDER — PHENYLEPHRINE HCL (PRESSORS) 10 MG/ML IV SOLN
INTRAVENOUS | Status: AC
  Filled 2021-09-03: qty 1

## 2021-09-03 MED ORDER — BUPROPION HCL ER (SR) 100 MG PO TB12
100.0000 mg | ORAL_TABLET | Freq: Every morning | ORAL | Status: DC
  Administered 2021-09-04 – 2021-09-13 (×10): 100 mg via ORAL
  Filled 2021-09-03 (×10): qty 1

## 2021-09-03 MED ORDER — HYDROMORPHONE HCL 1 MG/ML IJ SOLN
1.0000 mg | Freq: Once | INTRAMUSCULAR | Status: AC
  Administered 2021-09-03: 1 mg via INTRAVENOUS
  Filled 2021-09-03: qty 1

## 2021-09-03 MED ORDER — ENOXAPARIN SODIUM 40 MG/0.4ML IJ SOSY: 40.0000 mg | PREFILLED_SYRINGE | Freq: Two times a day (BID) | INTRAMUSCULAR | Status: DC

## 2021-09-03 MED ORDER — IOHEXOL 300 MG/ML  SOLN
INTRAMUSCULAR | Status: DC | PRN
  Administered 2021-09-03: 10 mL via URETHRAL

## 2021-09-03 MED ORDER — PROPOFOL 10 MG/ML IV BOLUS
INTRAVENOUS | Status: AC
  Filled 2021-09-03: qty 20

## 2021-09-03 MED ORDER — SODIUM CHLORIDE 0.9 % IV SOLN
2.0000 g | Freq: Three times a day (TID) | INTRAVENOUS | Status: DC
  Administered 2021-09-03 – 2021-09-06 (×8): 2 g via INTRAVENOUS
  Filled 2021-09-03 (×9): qty 2

## 2021-09-03 MED ORDER — LIDOCAINE 2% (20 MG/ML) 5 ML SYRINGE
INTRAMUSCULAR | Status: DC | PRN
  Administered 2021-09-03: 100 mg via INTRAVENOUS

## 2021-09-03 MED ORDER — PROCHLORPERAZINE EDISYLATE 10 MG/2ML IJ SOLN
10.0000 mg | Freq: Once | INTRAMUSCULAR | Status: AC
  Administered 2021-09-03: 10 mg via INTRAVENOUS
  Filled 2021-09-03: qty 2

## 2021-09-03 MED ORDER — LACTATED RINGERS IV BOLUS
20.0000 mL/kg | Freq: Once | INTRAVENOUS | Status: AC
  Administered 2021-09-03: 3828 mL via INTRAVENOUS

## 2021-09-03 MED ORDER — PROCHLORPERAZINE EDISYLATE 10 MG/2ML IJ SOLN: 10.0000 mg | Freq: Four times a day (QID) | INTRAMUSCULAR | Status: DC | PRN

## 2021-09-03 MED ORDER — FENTANYL CITRATE PF 50 MCG/ML IJ SOSY
50.0000 ug | PREFILLED_SYRINGE | Freq: Once | INTRAMUSCULAR | Status: AC
  Administered 2021-09-03: 50 ug via INTRAVENOUS
  Filled 2021-09-03: qty 1

## 2021-09-03 MED ORDER — ACETAMINOPHEN 500 MG PO TABS
1000.0000 mg | ORAL_TABLET | Freq: Once | ORAL | Status: AC
  Administered 2021-09-03: 1000 mg via ORAL
  Filled 2021-09-03: qty 2

## 2021-09-03 MED ORDER — PROPOFOL 10 MG/ML IV BOLUS
INTRAVENOUS | Status: DC | PRN
  Administered 2021-09-03: 200 mg via INTRAVENOUS

## 2021-09-03 MED ORDER — ROCURONIUM BROMIDE 10 MG/ML (PF) SYRINGE
PREFILLED_SYRINGE | INTRAVENOUS | Status: DC | PRN
  Administered 2021-09-03: 40 mg via INTRAVENOUS

## 2021-09-03 MED ORDER — CHLORHEXIDINE GLUCONATE CLOTH 2 % EX PADS
6.0000 | MEDICATED_PAD | Freq: Every day | CUTANEOUS | Status: DC
  Administered 2021-09-03 – 2021-09-13 (×10): 6 via TOPICAL

## 2021-09-03 MED ORDER — DROPERIDOL 2.5 MG/ML IJ SOLN
INTRAMUSCULAR | Status: DC | PRN
  Administered 2021-09-03: .625 mg via INTRAVENOUS

## 2021-09-03 MED ORDER — LACTATED RINGERS IV BOLUS: 20.0000 mL/kg | Freq: Once | INTRAVENOUS | Status: DC

## 2021-09-03 MED ORDER — ONDANSETRON HCL 4 MG/2ML IJ SOLN
INTRAMUSCULAR | Status: AC
  Filled 2021-09-03: qty 2

## 2021-09-03 MED ORDER — INSULIN REGULAR(HUMAN) IN NACL 100-0.9 UT/100ML-% IV SOLN
INTRAVENOUS | Status: DC
  Administered 2021-09-03: 13 [IU]/h via INTRAVENOUS
  Administered 2021-09-03: 6 [IU]/h via INTRAVENOUS
  Administered 2021-09-03: 15 [IU]/h via INTRAVENOUS
  Filled 2021-09-03 (×2): qty 100

## 2021-09-03 MED ORDER — SUGAMMADEX SODIUM 500 MG/5ML IV SOLN
INTRAVENOUS | Status: DC | PRN
  Administered 2021-09-03: 500 mg via INTRAVENOUS

## 2021-09-03 MED ORDER — DEXTROSE IN LACTATED RINGERS 5 % IV SOLN: INTRAVENOUS | Status: DC

## 2021-09-03 MED ORDER — FENTANYL CITRATE (PF) 100 MCG/2ML IJ SOLN
INTRAMUSCULAR | Status: AC
  Filled 2021-09-03: qty 2

## 2021-09-03 MED ORDER — HYDROXYZINE HCL 10 MG PO TABS: 50.0000 mg | ORAL_TABLET | Freq: Three times a day (TID) | ORAL | Status: DC | PRN

## 2021-09-03 MED ORDER — ATORVASTATIN CALCIUM 40 MG PO TABS
40.0000 mg | ORAL_TABLET | Freq: Every day | ORAL | Status: DC
  Administered 2021-09-04 – 2021-09-13 (×10): 40 mg via ORAL
  Filled 2021-09-03 (×10): qty 1

## 2021-09-03 MED ORDER — METRONIDAZOLE 500 MG/100ML IV SOLN
500.0000 mg | Freq: Once | INTRAVENOUS | Status: AC
  Administered 2021-09-03: 500 mg via INTRAVENOUS
  Filled 2021-09-03: qty 100

## 2021-09-03 SURGICAL SUPPLY — 18 items
BAG URO CATCHER STRL LF (MISCELLANEOUS) ×3 IMPLANT
CATH URETL OPEN END 6FR 70 (CATHETERS) ×1 IMPLANT
CLOTH BEACON ORANGE TIMEOUT ST (SAFETY) ×3 IMPLANT
DRSG KUZMA FLUFF (GAUZE/BANDAGES/DRESSINGS) ×1 IMPLANT
GLOVE SURG ENC MOIS LTX SZ7 (GLOVE) ×1 IMPLANT
GLOVE SURG ENC TEXT LTX SZ8 (GLOVE) ×3 IMPLANT
GLOVE SURG LTX SZ7 (GLOVE) ×1 IMPLANT
GOWN STRL REUS W/ TWL XL LVL3 (GOWN DISPOSABLE) IMPLANT
GOWN STRL REUS W/TWL XL LVL3 (GOWN DISPOSABLE) ×5 IMPLANT
GUIDEWIRE ANG ZIPWIRE 038X150 (WIRE) ×1 IMPLANT
GUIDEWIRE STR DUAL SENSOR (WIRE) ×3 IMPLANT
KIT TURNOVER KIT A (KITS) ×1 IMPLANT
MANIFOLD NEPTUNE II (INSTRUMENTS) ×3 IMPLANT
MAT HALF PREVALON HALF STRYKER (MISCELLANEOUS) ×1 IMPLANT
PACK CYSTO (CUSTOM PROCEDURE TRAY) ×3 IMPLANT
STENT URET 6FRX26 CONTOUR (STENTS) ×1 IMPLANT
TRAY FOLEY MTR SLVR 16FR STAT (SET/KITS/TRAYS/PACK) ×1 IMPLANT
TUBING CONNECTING 10 (TUBING) ×3 IMPLANT

## 2021-09-03 NOTE — ED Provider Notes (Signed)
[facility] DEPT Provider Note   CSN: [id number] Arrival date & time: 09/03/21  0520     History  Chief Complaint  Patient presents with   Abdominal Pain    BIB EMS today after a 4 day bout of Abdominal pain, N/V/D Complaining of pain in the Abdomen rated as a 9/10. Stopped taking all medications a month ago. Currently has elevated Temp, BP and Blood glucose.     Karen Bennett is a 46 y.o. female.  HPI Patient is a 46 year old female with a history of right BKA, PE, morbid obesity, type 2 diabetes mellitus, hyperlipidemia, renal insufficiency, anxiety, depression, hypertension, who presents to the emergency department due to nausea, vomiting, and abdominal pain.  Patient states that she has been depressed recently and has not been taking her regular medications for about 1 month.  She states that she is not suicidal.  Patient notes that about 4 days ago she began experiencing intermittent nausea and vomiting.  States that it is bilious.  Denies any hematemesis.  About 2 days ago she then began developing right-sided abdominal pain.  Reports associated constipation.    Home Medications Prior to Admission medications   Medication Sig Start Date End Date Taking? Authorizing Provider  acetaminophen (TYLENOL) 325 MG tablet Take 2 tablets (650 mg total) by mouth every 6 (six) hours as needed for mild pain or moderate pain. 02/09/21   Annita Brod, MD  atorvastatin (LIPITOR) 40 MG tablet Take 1 tablet (40 mg total) by mouth daily at 6 PM. 05/12/13   Buriev, Arie Sabina, MD  BIOTIN PO Take 1 Dose by mouth daily.    [provider]  buPROPion (WELLBUTRIN XL) 300 MG 24 hr tablet Take 300 mg by mouth daily.    [provider]  collagenase (SANTYL) ointment Apply topically daily. To left leg 01/04/21   Domenic Polite, MD  fluconazole (DIFLUCAN) 200 MG tablet Take 2 tablets (400 mg total) by mouth at bedtime. 02/09/21   Annita Brod, MD   furosemide (LASIX) 40 MG tablet Take 40 mg by mouth daily. 11/12/20   [provider]  glucose blood test strip Use as instructed 06/26/13   Advani, Vernon Prey, MD  glucose monitoring kit (FREESTYLE) monitoring kit 1 each by Does not apply route as needed for other. 06/26/13   Lorayne Marek, MD  HUMALOG KWIKPEN 100 UNIT/ML KwikPen Inject subcutaneously twice daily as per sliding scale: if 70-150= 0 units; 151-200= 2 u; 201-250= 4 u; 251-300= 6 u; 301-350= 8 u; 351-400= 10 u; 401-450= 12u; anything greater than 400 call physician. 10/27/20   [provider]  hydrOXYzine (VISTARIL) 50 MG capsule Take 50 mg by mouth every 6 (six) hours as needed for itching.    [provider]  insulin detemir (LEVEMIR) 100 UNIT/ML injection Inject 0.6 mLs (60 Units total) into the skin at bedtime. 02/09/21   Annita Brod, MD  Iron-FA-B Cmp-C-Biot-Probiotic (FUSION PLUS) CAPS Take 1 capsule by mouth daily.    [provider]  lisinopril (ZESTRIL) 2.5 MG tablet Take 2.5 mg by mouth daily.    [provider]  metoCLOPramide (REGLAN) 5 MG tablet Take 1 tablet (5 mg total) by mouth every 6 (six) hours as needed for nausea. 02/09/21   Annita Brod, MD  metoprolol tartrate (LOPRESSOR) 25 MG tablet Take 0.5 tablets (12.5 mg total) by mouth 2 (two) times daily. 02/09/21   Annita Brod, MD  omeprazole (PRILOSEC) 20 MG capsule Take  1 capsule by mouth daily. 11/12/20   [provider]  oxyCODONE (ROXICODONE) 15 MG immediate release tablet Take 1 tablet (15 mg total) by mouth every 6 (six) hours as needed for pain. 02/09/21   Annita Brod, MD  promethazine (PHENERGAN) 25 MG tablet Take 25 mg by mouth 2 (two) times daily as needed for nausea/vomiting. 01/12/21   [provider]  SitaGLIPtin-MetFORMIN HCl (JANUMET XR) 520 584 5351 MG TB24 Take 1 tablet by mouth daily.    [provider]  traZODone (DESYREL) 50 MG tablet Take 25 mg by mouth at bedtime.     [provider]  venlafaxine XR (EFFEXOR-XR) 75 MG 24 hr capsule Take 75 mg by mouth daily. 11/12/20   [provider]  XARELTO 20 MG TABS tablet Take 20 mg by mouth every evening. 11/12/20   [provider]      Allergies    Clindamycin, Zofran, Doxycycline, and Morphine and related    Review of Systems   Review of Systems  All other systems reviewed and are negative. Ten systems reviewed and are negative for acute change, except as noted in the HPI.   Physical Exam Updated Vital Signs BP (!) 105/94    Pulse 95    Temp 99.6 F (37.6 C) (Oral)    Resp 20    Ht '5\' 6"'  (1.676 m)    Wt (!) 191.4 kg    SpO2 97%    BMI 68.11 kg/m  Physical Exam Vitals and nursing note reviewed.  Constitutional:      General: She is not in acute distress.    Appearance: Normal appearance. She is well-developed. She is obese. She is not ill-appearing, toxic-appearing or diaphoretic.  HENT:     Head: Normocephalic and atraumatic.     Right Ear: External ear normal.     Left Ear: External ear normal.     Nose: Nose normal.     Mouth/Throat:     Mouth: Mucous membranes are moist.     Pharynx: Oropharynx is clear. No oropharyngeal exudate or posterior oropharyngeal erythema.  Eyes:     Extraocular Movements: Extraocular movements intact.  Cardiovascular:     Rate and Rhythm: Normal rate and regular rhythm.     Pulses: Normal pulses.     Heart sounds: Normal heart sounds. No murmur heard.   No friction rub. No gallop.  Pulmonary:     Effort: Pulmonary effort is normal. No respiratory distress.     Breath sounds: Normal breath sounds. No stridor. No wheezing, rhonchi or rales.  Abdominal:     General: Abdomen is flat.     Palpations: Abdomen is soft.     Tenderness: There is abdominal tenderness.     Comments: Protuberant abdomen that is soft.  Mild to moderate tenderness noted diffusely along the right central abdomen.  Difficult to assess due to body habitus.   Musculoskeletal:        General: Normal range of motion.     Cervical back: Normal range of motion and neck supple. No tenderness.     Comments: Status post right BKA.  Skin:    General: Skin is warm and dry.  Neurological:     General: No focal deficit present.     Mental Status: She is alert and oriented to person, place, and time.  Psychiatric:        Mood and Affect: Mood normal.        Behavior: Behavior normal.   ED Results /  Procedures / Treatments   Labs (all labs ordered are listed, but only abnormal results are displayed) Labs Reviewed  CBC WITH DIFFERENTIAL/PLATELET - Abnormal; Notable for the following components:      Result Value   WBC 13.0 (*)    Neutro Abs 12.1 (*)    Lymphs Abs 0.5 (*)    All other components within normal limits  CBG MONITORING, ED - Abnormal; Notable for the following components:   Glucose-Capillary >600 (*)    All other components within normal limits  I-STAT BETA HCG BLOOD, ED ([facility], [facility], [facility] ONLY) - Abnormal; Notable for the following components:   I-stat hCG, quantitative 8.9 (*)    All other components within normal limits  RESP PANEL BY RT-PCR (FLU A&B, COVID) ARPGX2  COMPREHENSIVE METABOLIC PANEL  URINALYSIS, ROUTINE W REFLEX MICROSCOPIC  LIPASE, BLOOD  BETA-HYDROXYBUTYRIC ACID  BLOOD GAS, VENOUS  OSMOLALITY   EKG None  Radiology No results found.  Procedures Procedures   Medications Ordered in ED Medications  lactated ringers bolus 1,000 mL (1,000 mLs Intravenous New Bag/Given 09/03/21 0551)  promethazine (PHENERGAN) tablet 25 mg (25 mg Oral Given 09/03/21 0603)  HYDROmorphone (DILAUDID) injection 1 mg (1 mg Intravenous Given 09/03/21 0604)    ED Course/ Medical Decision Making/ A&P                           Medical Decision Making Amount and/or Complexity of Data Reviewed Labs: ordered. Radiology: ordered.  Risk Prescription drug management.  Pt is a 46 y.o. female with a complex medical history who presents to  the emergency department due to nausea, vomiting, abdominal pain.  Patient found to be hyperglycemic greater than 600.  She reports being increasingly depressed over the past month and has not been taking her regular medications.  Denies any SI.  Labs: CBC with a white count of 13, neutrophils of 12.1, lymphocytes 0.5. CBG greater than 600. I-STAT beta-hCG of 8.9. VBG, UA, CMP, lipase, beta hydroxybutyric acid, osmolality, respiratory panel is pending.  Imaging: Chest x-ray is pending.  I, Rayna Sexton, PA-C, personally reviewed and evaluated these images and lab results as part of my medical decision-making.  On my exam heart is regular rate and rhythm.  Lungs clear to auscultation bilaterally.  Abdomen is soft and patient does have mild to moderate tenderness along the right side of the abdomen.  Reports history of constipation.  Also notes nausea and vomiting.  Difficult to assess due to body habitus.  Given patient's elevated blood sugar additional lab work was obtained.  Most of this is pending.  It is in my shift and patient care is being transferred to [place] PA-C.  Disposition pending based on results of her lab work as well as reassessment and response to treatment.  Note: Portions of this report may have been transcribed using voice recognition software. Every effort was made to ensure accuracy; however, inadvertent computerized transcription errors may be present.   Final Clinical Impression(s) / ED Diagnoses Final diagnoses:  Nausea and vomiting, unspecified vomiting type  Abdominal pain, unspecified abdominal location  Hyperglycemia  Noncompliance with medication regimen    Rx / DC Orders ED Discharge Orders     None         Rayna Sexton, PA-C 09/03/21 7482    Maudie Flakes, MD 09/03/21 2300

## 2021-09-03 NOTE — ED Notes (Signed)
This Clinical research associate had difficulty obtaining a second IV. IV team consult was put in. Antibiotics were started before blood cultures were drawn to prevent delay in treatment.

## 2021-09-03 NOTE — Sepsis Progress Note (Signed)
Notified provider of need to order lactic acid. ° °

## 2021-09-03 NOTE — ED Provider Notes (Signed)
I received this patient in handoff.  Please see note by Nigel Sloop, for full history.  In short patient is a 46 year old female with a past medical history of type 2 diabetes presenting today with 2 days worth of nausea, vomiting and abdominal pain.  She tells me that she has been increasingly depressed over the past few months and has not been taking her medications.  Believes she has had no insulin in over a month.  She is also not been taking her antihypertensives.   Patient was noted to be hyperglycemic prior to shift change and given fluids. Further hyperglycemic labs pending at shift change.  Plan will be for me to follow-up on labs, reexamine the patient's abdominal pain and disposition accordingly.  Physical Exam  BP (!) 105/94    Pulse 95    Temp 99.6 F (37.6 C) (Oral)    Resp 20    Ht 5\' 6"  (1.676 m)    Wt (!) 191.4 kg    SpO2 97%    BMI 68.11 kg/m   Physical Exam Vitals and nursing note reviewed.  Constitutional:      General: She is not in acute distress.    Appearance: Normal appearance.  HENT:     Head: Normocephalic and atraumatic.  Eyes:     General: No scleral icterus.    Conjunctiva/sclera: Conjunctivae normal.  Cardiovascular:     Rate and Rhythm: Normal rate and regular rhythm.  Pulmonary:     Effort: Pulmonary effort is normal. No respiratory distress.  Abdominal:     Tenderness: There is abdominal tenderness in the right upper quadrant and right lower quadrant.  Skin:    Findings: No rash.  Neurological:     Mental Status: She is alert.  Psychiatric:        Mood and Affect: Mood normal.   Procedures  .Critical Care Performed by: Rhae Hammock, PA-C Authorized by: Rhae Hammock, PA-C   Critical care provider statement:    Critical care time (minutes):  90   Critical care time was exclusive of:  Separately billable procedures and treating other patients   Critical care was necessary to treat or prevent imminent or life-threatening deterioration  of the following conditions:  Sepsis, metabolic crisis and endocrine crisis   Critical care was time spent personally by me on the following activities:  Development of treatment plan with patient or surrogate, discussions with consultants, evaluation of patient's response to treatment, examination of patient, ordering and review of laboratory studies, ordering and review of radiographic studies, ordering and performing treatments and interventions, pulse oximetry, re-evaluation of patient's condition and review of old charts   I assumed direction of critical care for this patient from another provider in my specialty: no     Care discussed with: admitting provider    ED Course / MDM   Clinical Course as of 09/03/21 1441  Sat Sep 03, 2021  1133 Oral temp 102.5 [MR]  1417 HR now 97, no longer diaphoretic. O2 remains at 100%. Reports feeling continued pain in RLQ. Nausea is still present but less severe. No increased work of breathing [MR]  1437 MD Dahlstedt to see the patient  [MR]    Clinical Course User Index [MR] Trigg Delarocha, Cecilio Asper, PA-C   Medical Decision Making Amount and/or Complexity of Data Reviewed Labs: ordered. Radiology: ordered.  Risk OTC drugs. Prescription drug management.   When I went to reevaluate the patient after additional antiemetics and Dilaudid, she reported she still  had pain and was feeling nauseated and cold.  Appeared diaphoretic.  I rechecked her temperature and her oral temperature was 102.5 and her heart rate at that time was 117.  Sepsis protocol initiated.  With further history taking, patient reports that she has been experiencing some dysuria.  No hematuria.  This has been going on for a few days.   Labwork: Ordered and reviewed by me.  Remarkable results include white blood cell count 13, sodium 123, chloride 90, lactic 3.9, bacteriuria and hematuria  I received a phone call about a critical result from radiologist and the read is below:  IMPRESSION:   1. Chronically obstructing 1.3 x 1.0 cm stone at the right  ureteropelvic junction resulting in severe right-sided  hydronephrosis. There is extensive urothelial enhancement as well as  air within the right renal pelvis and intrarenal calices compatible  with infection. There is a complex intraparenchymal fluid and air  collection in the lower pole of the right kidney measuring up to 4.4  cm, suspicious for developing intrarenal abscess. Urology  consultation is recommended.  2. Small amount of air within the non-dependent portion of the  urinary bladder lumen, which may be secondary to recent  instrumentation versus infection.  3. Hepatosplenomegaly.  4. Cholelithiasis without evidence of acute cholecystitis.  5. Aortic atherosclerosis, age advanced (ICD10-I70.0).    Treatment: -Tylenol was given for fever -Metoprolol and lisinopril home doses were given for HTN -Empiric Flagyl and cefepime were given for patient's sepsis. -Insulin, fluids and potassium were given for patient's hyperglycemia  -Dilaudid x2, fentanyl x2  -Hyperglycemic treatment with insulin, patient's blood sugar came down from 516 to 278.  Will continue to trend.    Due to patient's sepsis, hyperglycemia, obstructing stone and renal abscess, she will be admitted to medicine with Dr. Olevia Bowens.    Darliss Ridgel 09/03/21 [place], Julie, MD 09/04/21 [phone]

## 2021-09-03 NOTE — Sepsis Progress Note (Signed)
eLink is following this Code Sepsis. °

## 2021-09-03 NOTE — ED Triage Notes (Signed)
BIB EMS today after a 4 day bout of Abdominal pain, N/V/D Complaining of pain in the Abdomen rated as a 9/10. Stopped taking all medications a month ago. Currently has elevated Temp, BP and Blood glucose.

## 2021-09-03 NOTE — Op Note (Signed)
Preoperative diagnosis: Right upper ureteral stone with hydronephrosis/renal abscess  Postoperative diagnosis: Same  Principal procedure: Cystoscopy, right retrograde ureteropyelogram, fluoroscopic interpretation, placement of 6 French by 26 cm contour double-J stent without tether  Surgeon: Foy Vanduyne  Anesthesia: General endotracheal  Specimen: None  Estimated blood loss: None  Complications: None  Indications: 46 year old female with multiple medical issues with significant sepsis from a right upper ureteral stone with obstruction/pyelonephrosis, gas in collecting system and a right lower pole abscess.  I have been consulted on her and have recommended urgent stent placement.  I discussed this with the patient who is aware of the procedure, the process and hopefully, expected outcomes.  She desires to proceed.  Findings: Bladder was full of purulent urine.  Generalized erythema of the bladder wall consistent with cystitis.  Ureteral orifices normal in location and configuration.  Retrograde study of the right ureter with revealed mild narrowing submentally with a filling defect in the right upper ureter and pyelocaliectasis above that.  This was consistent with the stone.  Description of procedure: The patient was properly identified and marked.  She was taken to the operating room where general endotracheal anesthetic was administered.  Due to the large size, it took a number of people who move her to the proper position on the cystoscopy table and position her/place her in lithotomy position.  Following this, perineum was prepped and draped, proper timeout performed.  21 French panendoscope advanced into the bladder.  Bladder drained of purulent urine.  Rinsed out several times with the water irrigation.  Following this circumferential inspection performed of the bladder.  The right ureteral orifice was cannulated with a 6 [place] open-ended catheter and using Omnipaque retrograde pyelogram  was performed with the above-mentioned findings.  Following this a sensor tip guidewire was advanced through the open-ended catheter, easily advanced into the upper pole calyx where a curl was seen.  Open-ended catheter removed, 26 cm x 6 French contour double-J stent was then passed over top of the guidewire, positioned appropriately, and the guidewire was removed to deploy the catheter.  Excellent proximal and distal curls were then seen using fluoroscopy and cystoscopy, respectively.  Purulent urine was seen coming from the stent.  At this point the scope was removed.  I passed a 16 French Foley catheter, balloon inflated with 10 cc of water and this was hooked to dependent drainage.  At this point the procedure was terminated.  The patient was awakened, taken to the intensive care unit having tolerated the procedure well

## 2021-09-03 NOTE — Anesthesia Procedure Notes (Addendum)
Procedure Name: Intubation Date/Time: 09/03/2021 5:21 PM Performed by: West Pugh, CRNA Pre-anesthesia Checklist: Patient identified, Emergency Drugs available, Suction available, Patient being monitored and Timeout performed Patient Re-evaluated:Patient Re-evaluated prior to induction Oxygen Delivery Method: Circle system utilized Preoxygenation: Pre-oxygenation with 100% oxygen Induction Type: IV induction and Rapid sequence Ventilation: Mask ventilation without difficulty Laryngoscope Size: Glidescope and 4 Grade View: Grade I Tube type: Oral Tube size: 7.5 mm Number of attempts: 1 Airway Equipment and Method: Rigid stylet and Video-laryngoscopy Placement Confirmation: ETT inserted through vocal cords under direct vision, positive ETCO2, CO2 detector and breath sounds checked- equal and bilateral Secured at: 22 cm Tube secured with: Tape Dental Injury: Teeth and Oropharynx as per pre-operative assessment  Difficulty Due To: Difficulty was anticipated and Difficult Airway- due to large tongue Future Recommendations: Recommend- induction with short-acting agent, and alternative techniques readily available

## 2021-09-03 NOTE — H&P (Signed)
History and Physical    Patient: Karen Bennett ZOX:[id number] DOB: Nov 27, 1975 DOA: 09/03/2021 DOS: the patient was seen and examined on 09/03/2021 PCP: Pablo Lawrence, NP  Patient coming from: Home  Chief Complaint:  Chief Complaint  Patient presents with   Abdominal Pain    BIB EMS today after a 4 day bout of Abdominal pain, N/V/D Complaining of pain in the Abdomen rated as a 9/10. Stopped taking all medications a month ago. Currently has elevated Temp, BP and Blood glucose.     HPI: Karen Bennett is a 46 y.o. female with medical history significant of anxiety, depression, cellulitis of the lower extremities, type 2 diabetes, infective endocarditis with MSSA, history of DVT, history of PE not taking anticoagulation, hypertension, mixed hypertriglyceridemia, class III obesity with a BMI of 68.11 kg/m, tobacco use, right BKA, active smoker, noncompliance with medical treatment  who last took her medications at least a month ago who is coming with a 4-day history of intermittent nausea, emesis, diarrhea, right-sided abdominal pain, polyuria, increased thirst, fever chills and malaise.  Denies rhinorrhea, sore throat, wheezing, but occasionally feels dyspneic.  No chest pain, palpitations, diaphoresis, PND or orthopnea.  She occasionally gets lower extremity edema.  Denied constipation, melena or hematochezia.  She has felt depressed recently and got off of all her medications including insulin.  ED course: Initial vital signs were temperature 99.6 F, pulse 90, respiration 20, BP 148/67 mmHg and O2 sat 99% on room air.  While in the ED, the patient received 1000 mL of acetaminophen, 2 g of cefepime IVPB, metronidazole 500 mg IVPB, 5000 mL of LR bolus, started on an insulin infusion and Phenergan 25 mg p.o. x1.  I added another 1000 mL of LR and 10 mg of Compazine IVP.   Lab work: Her CBC is her white count 13.0 with 93% neutrophils, hemoglobin 12.9 g/dL platelets 322.  Lipase was normal.   CMP showed a sodium 123, potassium 4.0, chloride 90 and CO2 20 mmol/L.  Glucose 603, BUN 19, creatinine 1.06 and calcium 8.5 mg/dL.  LFTs were normal, except for an albumin level 2.1 g/dL.  Venous blood gas had a mildly decreasing PCO2 and mild increase in acid-base deficit, but had a normal pH and bicarbonate level.  Imaging: Portable chest radiograph did not show any evidence of acute cardiopulmonary disease.  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  There is hepatosplenomegaly.  Cholelithiasis without evidence of acute cholecystitis.  Aortic atherosclerosis advanced for age.  Please see images and full radiology report for further details.  Review of Systems: As mentioned in the history of present illness. All other systems reviewed and are negative. Past Medical History:  Diagnosis Date   Anxiety and depression 06/26/2013   Cellulitis    Diabetes mellitus    Infective endocarditis    PE (pulmonary embolism) ~2007-2008   Not on anticoagulation   Past Surgical History:  Procedure Laterality Date   Abscess removal from L groin     APPLICATION OF WOUND VAC Left 05/01/2013   Procedure: APPLICATION OF WOUND VAC;  Surgeon: Jessy Oto, MD;  Location: [facility];  Service: Orthopedics;  Laterality: Left;   I & D EXTREMITY  11/12/2011   Procedure: IRRIGATION AND DEBRIDEMENT EXTREMITY;  Surgeon: Mcarthur Rossetti, MD;  Location: [facility];  Service:  Orthopedics;  Laterality: Left;  foot left   I & D EXTREMITY Left 09/06/2012   Procedure: IRRIGATION AND DEBRIDEMENT EXTREMITY;  Surgeon: Marybelle Killings, MD;  Location: [facility];  Service: Orthopedics;  Laterality: Left;   I & D EXTREMITY Left 05/01/2013   Procedure: INCISION AND DRAINAGE LEFT FOREFOOT ABCESS ;  Surgeon: Jessy Oto, MD;  Location: [facility];  Service: Orthopedics;  Laterality: Left;   INCISION AND DRAINAGE ABSCESS Left 12/27/2020   Procedure: INCISION AND DRAINAGE LEFT THIGH ABSCESS;  Surgeon: Clovis Riley, MD;  Location: [facility];  Service: General;  Laterality: Left;   IR PTA VENOUS EXCEPT DIALYSIS CIRCUIT  01/04/2021   IR VENO/EXT/UNI RIGHT  01/04/2021   Surgery to remove hematoma in L leg     TEE WITHOUT CARDIOVERSION N/A 01/03/2021   Procedure: TRANSESOPHAGEAL ECHOCARDIOGRAM (TEE);  Surgeon: Sueanne Margarita, MD;  Location: [facility] ENDOSCOPY;  Service: Cardiovascular;  Laterality: N/A;   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   Social History:  reports that she has never smoked. She has never used smokeless tobacco. She reports that she does not drink alcohol and does not use drugs.  Allergies  Allergen Reactions   Clindamycin Nausea And Vomiting   Zofran Itching and Nausea And Vomiting   Doxycycline Nausea And Vomiting   Morphine And Related Itching    Family History  Problem Relation Age of Onset   Diabetes Mother    Coronary artery disease Mother    Stroke Mother     Prior to Admission medications   Medication Sig Start Date End Date Taking? Authorizing Provider  acetaminophen (TYLENOL) 325 MG tablet Take 2 tablets (650 mg total) by mouth every 6 (six) hours as needed for mild pain or moderate pain. 02/09/21  Yes Annita Brod, MD  atorvastatin (LIPITOR) 40 MG tablet Take 1 tablet (40 mg total) by mouth daily at 6 PM. 05/12/13  Yes Buriev, Arie Sabina, MD  BIOTIN PO Take 1 Dose by mouth daily.   Yes [provider]  buPROPion ER (WELLBUTRIN SR) 100 MG 12 hr tablet Take 100 mg by mouth every morning. 05/09/21  Yes [provider]  diazepam (VALIUM) 5 MG tablet Take 5 mg by mouth every 8 (eight) hours as needed for anxiety. 03/30/21  Yes [provider]  fluconazole (DIFLUCAN) 200 MG tablet Take 2 tablets (400 mg total) by mouth at bedtime. 02/09/21  Yes Annita Brod, MD  furosemide  (LASIX) 40 MG tablet Take 40 mg by mouth daily. 11/12/20  Yes [provider]  HUMALOG KWIKPEN 100 UNIT/ML KwikPen 0-12 Units 3 (three) times daily. Inject subcutaneously twice daily as per sliding scale: if 70-150= 0 units; 151-200= 2 u; 201-250= 4 u; 251-300= 6 u; 301-350= 8 u; 351-400= 10 u; 401-450= 12u; anything greater than 400 call physician. 10/27/20  Yes [provider]  hydrOXYzine (VISTARIL) 50 MG capsule Take 50 mg by mouth every 8 (eight) hours as needed for itching.   Yes [provider]  insulin glargine (SEMGLEE, YFGN,) 100 UNIT/ML Solostar Pen Inject 60 Units into the skin at bedtime. 03/30/21  Yes [provider]  lisinopril (ZESTRIL) 2.5 MG tablet Take 2.5 mg by mouth daily.   Yes [provider]  metoCLOPramide (REGLAN) 5 MG tablet Take 1 tablet (5 mg total) by mouth every 6 (six) hours as needed for nausea. 02/09/21  Yes Annita Brod, MD  oxyCODONE (ROXICODONE) 15 MG immediate  release tablet Take 1 tablet (15 mg total) by mouth every 6 (six) hours as needed for pain. 02/09/21  Yes Annita Brod, MD  potassium chloride (KLOR-CON) 10 MEQ tablet Take 10 mEq by mouth daily. 05/09/21  Yes [provider]  promethazine (PHENERGAN) 25 MG tablet Take 25 mg by mouth 2 (two) times daily as needed for nausea/vomiting or nausea. 01/12/21  Yes [provider]  traZODone (DESYREL) 150 MG tablet Take 150 mg by mouth at bedtime. 03/30/21  Yes [provider]  XARELTO 20 MG TABS tablet Take 20 mg by mouth every evening. 11/12/20  Yes [provider]  collagenase (SANTYL) ointment Apply topically daily. To left leg Patient not taking: Reported on 09/03/2021 01/04/21   Domenic Polite, MD  glucose blood test strip Use as instructed 06/26/13   Lorayne Marek, MD  glucose monitoring kit (FREESTYLE) monitoring kit 1 each by Does not apply route as needed for other. 06/26/13   Lorayne Marek, MD  insulin detemir (LEVEMIR) 100  UNIT/ML injection Inject 0.6 mLs (60 Units total) into the skin at bedtime. Patient not taking: Reported on 09/03/2021 02/09/21   Annita Brod, MD  metoprolol tartrate (LOPRESSOR) 25 MG tablet Take 0.5 tablets (12.5 mg total) by mouth 2 (two) times daily. Patient not taking: Reported on 09/03/2021 02/09/21   Annita Brod, MD    Physical Exam: Vitals:   09/03/21 1557 09/03/21 1615 09/03/21 1810 09/03/21 1815  BP: 140/74 (!) 128/111 139/64   Pulse: 95 98 (!) 107 (!) 107  Resp: (!) _0 Temp: 100.2 F (37.9 C)     TempSrc: Oral     SpO2: 97% 94% 94% (!) 86%  Weight:      Height:       Physical Exam Vitals and nursing note reviewed.  Constitutional:      Appearance: She is well-developed. She is obese.  HENT:     Head: Normocephalic.  Eyes:     Pupils: Pupils are equal, round, and reactive to light.  Cardiovascular:     Rate and Rhythm: Regular rhythm. Tachycardia present.     Heart sounds: No murmur heard. Pulmonary:     Effort: Pulmonary effort is normal.     Breath sounds: Normal breath sounds.  Abdominal:     General: Abdomen is flat and protuberant. There is no distension or abdominal bruit.     Palpations: Abdomen is soft.     Tenderness: There is abdominal tenderness in the right upper quadrant. There is right CVA tenderness. There is no guarding or rebound.  Musculoskeletal:     Comments: Right BKA. Stage III lymphedema of LLE.  Skin:    General: Skin is warm and dry.     Findings: Rash present.     Comments: LLE rash.  Neurological:     General: No focal deficit present.     Mental Status: She is alert and oriented to person, place, and time.  Psychiatric:        Mood and Affect: Mood normal.        Behavior: Behavior normal.     Data Reviewed:  There are no new results to review at this time.  Assessment and Plan: Principal Problem:   Sepsis secondary to UTI [facility]) Status post stone removal/urinary stent placement. Admit to  stepdown/inpatient. Analgesics as needed. Continue IV fluids. Continue cefepime per pharmacy. Follow blood culture and sensitivity. Follow-up urine culture and sensitivity.  Active Problems:   Diabetic  hyperosmolar non-ketotic state ([place]) Keep NPO. Antiemetics as needed. Continue IV fluids. Continue insulin infusion per Endo tool. Monitor CBG closely. Monitor and replace electrolytes. Resume home insulin per Endo tool.    Diabetic peripheral neuropathy  associated with type 2 diabetes mellitus (HCC) Analgesics as needed.    HTN (hypertension) Resume metoprolol 12.5 mg p.o. twice daily. Consider resuming lisinopril once more hydrated.    HLD (hyperlipidemia)   Aortic atherosclerosis Resume atorvastatin 40 mg p.o. daily.    Morbid obesity (HCC)   BMI 60.0-69.9, adult (HCC) Lifestyle modifications. Consider nutritional services evaluation.    Tobacco abuse Declined nicotine replacement therapy. Tobacco cessation advised.    Hx of right BKA ([place]) Supportive care.  Addendum: PICC line team was unable to try PICC line due to subclavian stenosis.  If central IV line is needed she will need IR intervention.   Advance Care Planning:   Code Status: Full Code   Consults: Dr. Franchot Gallo (urology).  Family Communication:   Severity of Illness: The appropriate patient status for this patient is OBSERVATION. Observation status is judged to be reasonable and necessary in order to provide the required intensity of service to ensure the patient's safety. The patient's presenting symptoms, physical exam findings, and initial radiographic and laboratory data in the context of their medical condition is felt to place them at decreased risk for further clinical deterioration. Furthermore, it is anticipated that the patient will be medically stable for discharge from the hospital within 2 midnights of admission.   Author: Reubin Milan, MD 09/03/2021 6:23 PM  For on call  review [email or web address].   This document was prepared using Dragon voice recognition software and may contain some unintended transcription errors.

## 2021-09-03 NOTE — Anesthesia Postprocedure Evaluation (Signed)
Anesthesia Post Note  Patient: Karen Bennett  Procedure(s) Performed: CYSTOSCOPY WITH RETROGRADE PYELOGRAM/URETERAL STENT PLACEMENT (Right: Ureter)     Patient location during evaluation: PACU Anesthesia Type: General Level of consciousness: awake and alert Pain management: pain level controlled Vital Signs Assessment: post-procedure vital signs reviewed and stable Respiratory status: spontaneous breathing, nonlabored ventilation, respiratory function stable and patient connected to nasal cannula oxygen Cardiovascular status: blood pressure returned to baseline Postop Assessment: no apparent nausea or vomiting Anesthetic complications: no   No notable events documented.  Last Vitals:  Vitals:   09/03/21 1810 09/03/21 1815  BP: 139/64   Pulse: (!) 107 (!) 107  Resp: 11 12  Temp:    SpO2: 94% (!) 86%    Last Pain:  Vitals:   09/03/21 1810  TempSrc:   PainSc: 0-No pain                 Shanda Howells

## 2021-09-03 NOTE — ED Notes (Signed)
CRITICAL VALUE STICKER  CRITICAL VALUE: Glucose 603  DATE & TIME NOTIFIED: 09/03/2021 0806  MD NOTIFIED: Debe Coder [place] PA  [place]: [phone]

## 2021-09-03 NOTE — Anesthesia Preprocedure Evaluation (Addendum)
Anesthesia Evaluation  Patient identified by MRN, date of birth, ID band Patient awake    Reviewed: Allergy & Precautions, NPO status , Patient's Chart, lab work & pertinent test results  History of Anesthesia Complications Negative for: history of anesthetic complications  Airway Mallampati: II  TM Distance: >3 FB     Dental  (+) Poor Dentition   Pulmonary neg pulmonary ROS,    breath sounds clear to auscultation       Cardiovascular hypertension, + Peripheral Vascular Disease   Rhythm:Regular Rate:Normal     Neuro/Psych PSYCHIATRIC DISORDERS Anxiety Depression  Neuromuscular disease    GI/Hepatic History noted Dr. Chilton Si   Endo/Other  diabetes, Poorly Controlled, Type obesity  Renal/GU Renal disease (sepsis with obstructive stone)     Musculoskeletal   Abdominal   Peds  Hematology   Anesthesia Other Findings   Reproductive/Obstetrics                           Anesthesia Physical Anesthesia Plan  ASA: 3 and emergent  Anesthesia Plan: General   Post-op Pain Management: Minimal or no pain anticipated   Induction: Intravenous and Rapid sequence  PONV Risk Score and Plan: 3 and Midazolam, Droperidol and Treatment may vary due to age or medical condition  Airway Management Planned: Oral ETT  Additional Equipment: None  Intra-op Plan:   Post-operative Plan: Extubation in OR  Informed Consent: I have reviewed the patients History and Physical, chart, labs and discussed the procedure including the risks, benefits and alternatives for the proposed anesthesia with the patient or authorized representative who has indicated his/her understanding and acceptance.     Dental advisory given  Plan Discussed with: Anesthesiologist and CRNA  Anesthesia Plan Comments:       Anesthesia Quick Evaluation

## 2021-09-03 NOTE — Progress Notes (Signed)
Pharmacy Antibiotic Note  Karen Bennett is a 46 y.o. female with hx DM presented to he ED on 09/03/2021 with c/o abdominal pain and n/v.  CBG was found to be >600 and is now on an insulin drip.  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 Pharmacy has been consulted to dose cefepime for infection.   Plan: - cefepime 2gm IV q8h  _____________________________________  Height: 5\' 6"  (167.6 cm) Weight: (!) 191.4 kg (422 lb) IBW/kg (Calculated) : 59.3  Temp (24hrs), Avg:99.6 F (37.6 C), Min:99.6 F (37.6 C), Max:99.6 F (37.6 C)  Recent Labs  Lab 09/03/21 0540 09/03/21 1304 09/03/21 1416  WBC 13.0*  --   --   CREATININE 1.06*  --   --   LATICACIDVEN  --  3.9* 2.9*    Estimated Creatinine Clearance: 119.9 mL/min (A) (by C-G formula based on SCr of 1.06 mg/dL (H)).    Allergies  Allergen Reactions   Clindamycin Nausea And Vomiting   Zofran Itching and Nausea And Vomiting   Doxycycline Nausea And Vomiting   Morphine And Related Itching     Thank you for allowing pharmacy to be a part of this patients care.  11/01/21 09/03/2021 3:49 PM

## 2021-09-03 NOTE — Addendum Note (Signed)
Addendum  created 09/03/21 1918 by West Pugh, CRNA   Charge Capture section accepted, Clinical Note Signed, Flowsheet accepted, Intraprocedure Blocks edited, Intraprocedure Meds edited, SmartForm saved, Visit diagnoses modified

## 2021-09-03 NOTE — Transfer of Care (Signed)
Immediate Anesthesia Transfer of Care Note  Patient: Karen Bennett  Procedure(s) Performed: CYSTOSCOPY WITH RETROGRADE PYELOGRAM/URETERAL STENT PLACEMENT (Right: Ureter)  Patient Location: PACU  Anesthesia Type:General  Level of Consciousness: awake, alert  and patient cooperative  Airway & Oxygen Therapy: Patient Spontanous Breathing and Patient connected to face mask oxygen  Post-op Assessment: Report given to RN and Post -op Vital signs reviewed and stable  Post vital signs: Reviewed and stable  Last Vitals:  Vitals Value Taken Time  BP 112/70 09/03/21 1816  Temp    Pulse 106 09/03/21 1822  Resp 22 09/03/21 1822  SpO2 91 % 09/03/21 1822  Vitals shown include unvalidated device data.  Last Pain:  Vitals:   09/03/21 1810  TempSrc:   PainSc: 0-No pain         Complications: No notable events documented.

## 2021-09-03 NOTE — Consult Note (Signed)
Urology Consult   Physician requesting consult: Isla Pence, MD  Reason for consult: Renal abscess, ureteral stone, sepsis  History of Present Illness: Karen Bennett is a 46 y.o. female without prior urologic history who presented to the emergency room today with 2 weeks of abdominal pain.  Worked up for this abdominal pain with a CT scan.  This revealed a large right upper ureteral calculus, proximal hydroureteronephrosis/pyelocaliectasis, gas in the collecting system and a complex abscess in the right lower pole.  Code sepsis initiated, urologic consult requested.  She has never before had kidney stones but see states that she has had urinary tract infections in the past.  She is diabetic and has multiple other medical issues.  Due to recent depression, she has been off all her medications including insulin.   Past Medical History:  Diagnosis Date   Cellulitis    Diabetes mellitus    PE (pulmonary embolism) ~2007-2008   Not on anticoagulation    Past Surgical History:  Procedure Laterality Date   Abscess removal from L groin     APPLICATION OF WOUND VAC Left 05/01/2013   Procedure: APPLICATION OF WOUND VAC;  Surgeon: Jessy Oto, MD;  Location: [facility];  Service: Orthopedics;  Laterality: Left;   I & D EXTREMITY  11/12/2011   Procedure: IRRIGATION AND DEBRIDEMENT EXTREMITY;  Surgeon: Mcarthur Rossetti, MD;  Location: [facility];  Service: Orthopedics;  Laterality: Left;  foot left   I & D EXTREMITY Left 09/06/2012   Procedure: IRRIGATION AND DEBRIDEMENT EXTREMITY;  Surgeon: Marybelle Killings, MD;  Location: [facility];  Service: Orthopedics;  Laterality: Left;   I & D EXTREMITY Left 05/01/2013   Procedure: INCISION AND DRAINAGE LEFT FOREFOOT ABCESS ;  Surgeon: Jessy Oto, MD;  Location: [facility];  Service: Orthopedics;  Laterality: Left;   INCISION AND DRAINAGE ABSCESS Left 12/27/2020   Procedure: INCISION AND DRAINAGE LEFT THIGH ABSCESS;  Surgeon: Clovis Riley, MD;  Location: [facility];   Service: General;  Laterality: Left;   IR PTA VENOUS EXCEPT DIALYSIS CIRCUIT  01/04/2021   IR VENO/EXT/UNI RIGHT  01/04/2021   Surgery to remove hematoma in L leg     TEE WITHOUT CARDIOVERSION N/A 01/03/2021   Procedure: TRANSESOPHAGEAL ECHOCARDIOGRAM (TEE);  Surgeon: Sueanne Margarita, MD;  Location: [facility];  Service: Cardiovascular;  Laterality: N/A;   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     Current Hospital Medications: Scheduled Meds: Continuous Infusions:  dextrose 5% lactated ringers     insulin 13 Units/hr (09/03/21 1443)   lactated ringers     lactated ringers 125 mL/hr at 09/03/21 1113   PRN Meds:.dextrose, prochlorperazine  Allergies:  Allergies  Allergen Reactions   Clindamycin Nausea And Vomiting   Zofran Itching and Nausea And Vomiting   Doxycycline Nausea And Vomiting   Morphine And Related Itching    Family History  Problem Relation Age of Onset   Diabetes Mother    Coronary artery disease Mother    Stroke Mother     Social History:  reports that she has never smoked. She has never used smokeless tobacco. She reports that she does not drink alcohol and does not use drugs.  ROS: A complete review of systems was performed.  All systems are negative except for pertinent findings as noted.  Physical Exam:  Vital signs in last 24 hours: Temp:  [99.6 F (37.6 C)] 99.6 F (37.6 C) (02/11 0536) Pulse Rate:  [  88-126] 100 (02/11 1445) Resp:  [16-31] 25 (02/11 1445) BP: (87-213)/(52-169) 141/108 (02/11 1445) SpO2:  [85 %-100 %] 96 % (02/11 1445) Weight:  [191.4 kg] 191.4 kg (02/11 0538) General:  Alert and oriented, No acute distress.  Morbidly obese HEENT: Normocephalic, atraumatic Neck: No JVD or lymphadenopathy Cardiovascular: Regular rate  Lungs: Normal inspiratory/expiratory excursion Abdomen: Soft, nontender, nondistended, no abdominal masses Extremities: No edema Neurologic: Grossly intact  Laboratory Data:  Recent Labs    09/03/21 0540   WBC 13.0*  HGB 12.9  HCT 38.7  PLT 322    Recent Labs    09/03/21 0540  NA 123*  K 4.0  CL 90*  GLUCOSE 603*  BUN 19  CALCIUM 8.5*  CREATININE 1.06*     Results for orders placed or performed during the hospital encounter of 09/03/21 (from the past 24 hour(s))  CBG monitoring, ED     Status: Abnormal   Collection Time: 09/03/21  5:32 AM  Result Value Ref Range   Glucose-Capillary >600 (HH) 70 - 99 mg/dL  Comprehensive metabolic panel     Status: Abnormal   Collection Time: 09/03/21  5:40 AM  Result Value Ref Range   Sodium 123 (L) 135 - 145 mmol/L   Potassium 4.0 3.5 - 5.1 mmol/L   Chloride 90 (L) 98 - 111 mmol/L   CO2 20 (L) 22 - 32 mmol/L   Glucose, Bld 603 (HH) 70 - 99 mg/dL   BUN 19 6 - 20 mg/dL   Creatinine, Ser 1.06 (H) 0.44 - 1.00 mg/dL   Calcium 8.5 (L) 8.9 - 10.3 mg/dL   Total Protein 7.5 6.5 - 8.1 g/dL   Albumin 2.1 (L) 3.5 - 5.0 g/dL   AST 15 15 - 41 U/L   ALT 10 0 - 44 U/L   Alkaline Phosphatase 91 38 - 126 U/L   Total Bilirubin 0.9 0.3 - 1.2 mg/dL   GFR, Estimated >60 >60 mL/min   Anion gap 13 5 - 15  CBC with Differential     Status: Abnormal   Collection Time: 09/03/21  5:40 AM  Result Value Ref Range   WBC 13.0 (H) 4.0 - 10.5 K/uL   RBC 4.54 3.87 - 5.11 MIL/uL   Hemoglobin 12.9 12.0 - 15.0 g/dL   HCT 38.7 36.0 - 46.0 %   MCV 85.2 80.0 - 100.0 fL   MCH 28.4 26.0 - 34.0 pg   MCHC 33.3 30.0 - 36.0 g/dL   RDW 12.7 11.5 - 15.5 %   Platelets 322 150 - 400 K/uL   nRBC 0.0 0.0 - 0.2 %   Neutrophils Relative % 93 %   Neutro Abs 12.1 (H) 1.7 - 7.7 K/uL   Lymphocytes Relative 4 %   Lymphs Abs 0.5 (L) 0.7 - 4.0 K/uL   Monocytes Relative 2 %   Monocytes Absolute 0.3 0.1 - 1.0 K/uL   Eosinophils Relative 0 %   Eosinophils Absolute 0.0 0.0 - 0.5 K/uL   Basophils Relative 0 %   Basophils Absolute 0.0 0.0 - 0.1 K/uL   Immature Granulocytes 1 %   Abs Immature Granulocytes 0.06 0.00 - 0.07 K/uL  Lipase, blood     Status: None   Collection Time:  09/03/21  5:40 AM  Result Value Ref Range   Lipase 44 11 - 51 U/L  Beta-hydroxybutyric acid     Status: Abnormal   Collection Time: 09/03/21  5:40 AM  Result Value Ref Range   Beta-Hydroxybutyric Acid 1.33 (H) 0.05 -  0.27 mmol/L  Osmolality     Status: Abnormal   Collection Time: 09/03/21  5:40 AM  Result Value Ref Range   Osmolality 300 (H) 275 - 295 mOsm/kg  I-Stat beta hCG blood, ED     Status: Abnormal   Collection Time: 09/03/21  5:49 AM  Result Value Ref Range   I-stat hCG, quantitative 8.9 (H) <5 mIU/mL   Comment 3          Resp Panel by RT-PCR (Flu A&B, Covid) Nasopharyngeal Swab     Status: None   Collection Time: 09/03/21  5:57 AM   Specimen: Nasopharyngeal Swab; Nasopharyngeal(NP) swabs in vial transport medium  Result Value Ref Range   SARS Coronavirus 2 by RT PCR NEGATIVE NEGATIVE   Influenza A by PCR NEGATIVE NEGATIVE   Influenza B by PCR NEGATIVE NEGATIVE  Blood gas, venous     Status: Abnormal   Collection Time: 09/03/21  8:15 AM  Result Value Ref Range   pH, Ven 7.343 7.250 - 7.430   pCO2, Ven 42.4 (L) 44.0 - 60.0 mmHg   pO2, Ven 33.7 32.0 - 45.0 mmHg   Bicarbonate 22.4 20.0 - 28.0 mmol/L   Acid-base deficit 2.7 (H) 0.0 - 2.0 mmol/L   O2 Saturation 61.4 %   Patient temperature 98.6   CBG monitoring, ED     Status: Abnormal   Collection Time: 09/03/21  8:46 AM  Result Value Ref Range   Glucose-Capillary >600 (HH) 70 - 99 mg/dL  CBG monitoring, ED     Status: Abnormal   Collection Time: 09/03/21  9:29 AM  Result Value Ref Range   Glucose-Capillary >600 (HH) 70 - 99 mg/dL  CBG monitoring, ED     Status: Abnormal   Collection Time: 09/03/21 10:14 AM  Result Value Ref Range   Glucose-Capillary 519 (HH) 70 - 99 mg/dL   Comment 1 Notify RN   CBG monitoring, ED     Status: Abnormal   Collection Time: 09/03/21 10:57 AM  Result Value Ref Range   Glucose-Capillary 466 (H) 70 - 99 mg/dL  Urinalysis, Routine w reflex microscopic     Status: Abnormal   Collection  Time: 09/03/21 10:58 AM  Result Value Ref Range   Color, Urine YELLOW YELLOW   APPearance TURBID (A) CLEAR   Specific Gravity, Urine 1.022 1.005 - 1.030   pH 5.0 5.0 - 8.0   Glucose, UA >=500 (A) NEGATIVE mg/dL   Hgb urine dipstick SMALL (A) NEGATIVE   Bilirubin Urine NEGATIVE NEGATIVE   Ketones, ur 5 (A) NEGATIVE mg/dL   Protein, ur >=300 (A) NEGATIVE mg/dL   Nitrite NEGATIVE NEGATIVE   Leukocytes,Ua MODERATE (A) NEGATIVE   RBC / HPF 21-50 0 - 5 RBC/hpf   WBC, UA >50 (H) 0 - 5 WBC/hpf   Bacteria, UA MANY (A) NONE SEEN   Squamous Epithelial / LPF 6-10 0 - 5   WBC Clumps PRESENT    Budding Yeast PRESENT    Amorphous Crystal PRESENT   Pregnancy, urine     Status: None   Collection Time: 09/03/21 10:58 AM  Result Value Ref Range   Preg Test, Ur NEGATIVE NEGATIVE  CBG monitoring, ED     Status: Abnormal   Collection Time: 09/03/21 11:51 AM  Result Value Ref Range   Glucose-Capillary 441 (H) 70 - 99 mg/dL  CBG monitoring, ED     Status: Abnormal   Collection Time: 09/03/21 12:51 PM  Result Value Ref Range   Glucose-Capillary 385 (  H) 70 - 99 mg/dL  Lactic acid, plasma     Status: Abnormal   Collection Time: 09/03/21  1:04 PM  Result Value Ref Range   Lactic Acid, Venous 3.9 (HH) 0.5 - 1.9 mmol/L  Lactic acid, plasma     Status: Abnormal   Collection Time: 09/03/21  2:16 PM  Result Value Ref Range   Lactic Acid, Venous 2.9 (HH) 0.5 - 1.9 mmol/L  CBG monitoring, ED     Status: Abnormal   Collection Time: 09/03/21  2:41 PM  Result Value Ref Range   Glucose-Capillary 278 (H) 70 - 99 mg/dL   Recent Results (from the past 240 hour(s))  Resp Panel by RT-PCR (Flu A&B, Covid) Nasopharyngeal Swab     Status: None   Collection Time: 09/03/21  5:57 AM   Specimen: Nasopharyngeal Swab; Nasopharyngeal(NP) swabs in vial transport medium  Result Value Ref Range Status   SARS Coronavirus 2 by RT PCR NEGATIVE NEGATIVE Final    Comment: (NOTE) SARS-CoV-2 target nucleic acids are NOT  DETECTED.  The SARS-CoV-2 RNA is generally detectable in upper respiratory specimens during the acute phase of infection. The lowest concentration of SARS-CoV-2 viral copies this assay can detect is 138 copies/mL. A negative result does not preclude SARS-Cov-2 infection and should not be used as the sole basis for treatment or other patient management decisions. A negative result may occur with  improper specimen collection/handling, submission of specimen other than nasopharyngeal swab, presence of viral mutation(s) within the areas targeted by this assay, and inadequate number of viral copies(<138 copies/mL). A negative result must be combined with clinical observations, patient history, and epidemiological information. The expected result is Negative.  Fact Sheet for Patients:  EntrepreneurPulse.com.au  Fact Sheet for Healthcare Providers:  IncredibleEmployment.be  This test is no t yet approved or cleared by the [place] FDA and  has been authorized for detection and/or diagnosis of SARS-CoV-2 by FDA under an Emergency Use Authorization (EUA). This EUA will remain  in effect (meaning this test can be used) for the duration of the COVID-19 declaration under Section 564(b)(1) of the Act, 21 U.S.C.section 360bbb-3(b)(1), unless the authorization is terminated  or revoked sooner.       Influenza A by PCR NEGATIVE NEGATIVE Final   Influenza B by PCR NEGATIVE NEGATIVE Final    Comment: (NOTE) The Xpert Xpress SARS-CoV-2/FLU/RSV plus assay is intended as an aid in the diagnosis of influenza from Nasopharyngeal swab specimens and should not be used as a sole basis for treatment. Nasal washings and aspirates are unacceptable for Xpert Xpress SARS-CoV-2/FLU/RSV testing.  Fact Sheet for Patients: EntrepreneurPulse.com.au  Fact Sheet for Healthcare Providers: IncredibleEmployment.be  This test is not yet  approved or cleared by the [place] FDA and has been authorized for detection and/or diagnosis of SARS-CoV-2 by FDA under an Emergency Use Authorization (EUA). This EUA will remain in effect (meaning this test can be used) for the duration of the COVID-19 declaration under Section 564(b)(1) of the Act, 21 U.S.C. section 360bbb-3(b)(1), unless the authorization is terminated or revoked.  Performed at [facility], [place] [address], [place], [place] [address]     Renal Function: Recent Labs    09/03/21 0540  CREATININE 1.06*   Estimated Creatinine Clearance: 119.9 mL/min (A) (by C-G formula based on SCr of 1.06 mg/dL (H)).  Radiologic Imaging: CT ABDOMEN PELVIS W CONTRAST  Result Date: 09/03/2021 CLINICAL DATA:  Abdominal pain for 4 days. EXAM: CT ABDOMEN AND PELVIS WITH  CONTRAST TECHNIQUE: Multidetector CT imaging of the abdomen and pelvis was performed using the standard protocol following bolus administration of intravenous contrast. RADIATION DOSE REDUCTION: This exam was performed according to the departmental dose-optimization program which includes automated exposure control, adjustment of the mA and/or kV according to patient size and/or use of iterative reconstruction technique. CONTRAST:  157mL OMNIPAQUE IOHEXOL 300 MG/ML  SOLN COMPARISON:  CT 11/10/2017 FINDINGS: Lower chest: Coronary artery atherosclerosis.  No acute findings. Hepatobiliary: Hepatomegaly. Liver measures approximately 27 cm in length. No focal liver lesion identified. There are a few tiny layering stones within the gallbladder. No appreciable pericholecystic inflammatory changes by CT. Pancreas: Unremarkable. No pancreatic ductal dilatation or surrounding inflammatory changes. Spleen: Spleen is enlarged measuring approximately 14 cm in length. Scattered punctate calcified granulomas within the spleen. Adrenals/Urinary Tract: Unremarkable adrenal glands. Chronically obstructing 1.3 x 1.0 cm stone  at the right ureteropelvic junction resulting in severe right-sided hydronephrosis. There is air within the renal pelvis and intrarenal calices. Extensive right-sided urothelial enhancement. There is a complex intraparenchymal fluid and air collection in the lower pole of the right kidney measuring approximately 4.4 x 3.4 x 3.7 cm (series 2, image 59). Extensive right-sided perinephric stranding. Small calcification within the upper pole of the left kidney, likely vascular. Left kidney is otherwise unremarkable. No hydronephrosis. There is a small amount of air within the nondependent portion of the urinary bladder lumen. Stomach/Bowel: Stomach is within normal limits. No evidence of bowel wall thickening, distention, or inflammatory changes. Vascular/Lymphatic: Extensive age advanced atherosclerotic calcifications throughout the aortoiliac axis. Multiple borderline enlarged retroperitoneal lymph nodes, likely reactive. Reproductive: No acute findings. Other: No ascites.  No pneumoperitoneum. Musculoskeletal: No acute osseous findings. Degenerative spondylosis with grade 1 anterolisthesis of L5 on S1. IMPRESSION: 1. Chronically obstructing 1.3 x 1.0 cm stone at the right ureteropelvic junction resulting in severe right-sided hydronephrosis. There is extensive urothelial enhancement as well as air within the right renal pelvis and intrarenal calices compatible with infection. There is a complex intraparenchymal fluid and air collection in the lower pole of the right kidney measuring up to 4.4 cm, suspicious for developing intrarenal abscess. Urology consultation is recommended. 2. Small amount of air within the non-dependent portion of the urinary bladder lumen, which may be secondary to recent instrumentation versus infection. 3. Hepatosplenomegaly. 4. Cholelithiasis without evidence of acute cholecystitis. 5. Aortic atherosclerosis, age advanced (ICD10-I70.0). These results were called by telephone at the time of  interpretation on 09/03/2021 at 2:28 pm to provider MADISON REDWINE, PA, who verbally acknowledged these results. Electronically Signed   By: Davina Poke D.O.   On: 09/03/2021 14:30   DG Chest Portable 1 View  Result Date: 09/03/2021 CLINICAL DATA:  46 year old female with history of abdominal pain, nausea, vomiting and diarrhea. EXAM: PORTABLE CHEST 1 VIEW COMPARISON:  Chest x-ray 02/01/2021. FINDINGS: Lung volumes are normal. No consolidative airspace disease. No pleural effusions. No pneumothorax. No pulmonary nodule or mass noted. Pulmonary vasculature and the cardiomediastinal silhouette are within normal limits. IMPRESSION: No radiographic evidence of acute cardiopulmonary disease. Electronically Signed   By: Vinnie Langton M.D.   On: 09/03/2021 07:08    I independently reviewed the above imaging studies as well as relevant laboratories.  Impression/Assessment:  Right upper ureteral stone with proximal hydroureteronephrosis, associated urinary tract infection with gas in collecting system as well as complex abscess in the right lower pole.  Patient is septic  Plan:  I discussed with the patient the fact that she has a serious  issue, namely obstructing stone, improperly draining right kidney as well as complicated infection of her right kidney.  I discussed the fact that she needs urgent stenting, antibiotic management as well as significant medical resuscitation.  Following this, on outpatient basis we will proceed with stone removal.  She is aware of the procedure of cystoscopy and stent placement.  We will perform that urgently.

## 2021-09-03 NOTE — Progress Notes (Signed)
A consult was received from an ED physician for cefepime per pharmacy dosing.  The patient's profile has been reviewed for ht/wt/allergies/indication/available labs.    A one time order has been placed for cefepime 2gm IV x1.  Further antibiotics/pharmacy consults should be ordered by admitting physician if indicated.                       Thank you, Lucia Gaskins 09/03/2021  11:46 AM

## 2021-09-03 NOTE — Progress Notes (Addendum)
Dr Olevia Bowens notified PICC is to be done by IR due to hx of severe stenosis. Will cancel order for IV team placed PICC. Tiffany RN also notified via secure chat.

## 2021-09-04 ENCOUNTER — Encounter (HOSPITAL_COMMUNITY): Payer: Self-pay | Admitting: Urology

## 2021-09-04 DIAGNOSIS — I10 Essential (primary) hypertension: Secondary | ICD-10-CM

## 2021-09-04 DIAGNOSIS — N289 Disorder of kidney and ureter, unspecified: Secondary | ICD-10-CM | POA: Diagnosis not present

## 2021-09-04 DIAGNOSIS — E871 Hypo-osmolality and hyponatremia: Secondary | ICD-10-CM

## 2021-09-04 DIAGNOSIS — E1100 Type 2 diabetes mellitus with hyperosmolarity without nonketotic hyperglycemic-hyperosmolar coma (NKHHC): Secondary | ICD-10-CM

## 2021-09-04 DIAGNOSIS — Z720 Tobacco use: Secondary | ICD-10-CM

## 2021-09-04 DIAGNOSIS — E1142 Type 2 diabetes mellitus with diabetic polyneuropathy: Secondary | ICD-10-CM

## 2021-09-04 DIAGNOSIS — Z6841 Body Mass Index (BMI) 40.0 and over, adult: Secondary | ICD-10-CM

## 2021-09-04 DIAGNOSIS — N201 Calculus of ureter: Secondary | ICD-10-CM

## 2021-09-04 DIAGNOSIS — E785 Hyperlipidemia, unspecified: Secondary | ICD-10-CM

## 2021-09-04 DIAGNOSIS — A419 Sepsis, unspecified organism: Secondary | ICD-10-CM | POA: Diagnosis not present

## 2021-09-04 DIAGNOSIS — Z89511 Acquired absence of right leg below knee: Secondary | ICD-10-CM

## 2021-09-04 LAB — CBC WITH DIFFERENTIAL/PLATELET
Abs Immature Granulocytes: 0.18 10*3/uL — ABNORMAL HIGH (ref 0.00–0.07)
Basophils Absolute: 0.1 10*3/uL (ref 0.0–0.1)
Basophils Relative: 1 %
Eosinophils Absolute: 0 10*3/uL (ref 0.0–0.5)
Eosinophils Relative: 0 %
HCT: 46.2 % — ABNORMAL HIGH (ref 36.0–46.0)
Hemoglobin: 14.5 g/dL (ref 12.0–15.0)
Immature Granulocytes: 2 %
Lymphocytes Relative: 4 %
Lymphs Abs: 0.5 10*3/uL — ABNORMAL LOW (ref 0.7–4.0)
MCH: 28.3 pg (ref 26.0–34.0)
MCHC: 31.4 g/dL (ref 30.0–36.0)
MCV: 90.2 fL (ref 80.0–100.0)
Monocytes Absolute: 0.5 10*3/uL (ref 0.1–1.0)
Monocytes Relative: 4 %
Neutro Abs: 10.3 10*3/uL — ABNORMAL HIGH (ref 1.7–7.7)
Neutrophils Relative %: 89 %
Platelets: 218 10*3/uL (ref 150–400)
RBC: 5.12 MIL/uL — ABNORMAL HIGH (ref 3.87–5.11)
RDW: 13.2 % (ref 11.5–15.5)
WBC: 11.6 10*3/uL — ABNORMAL HIGH (ref 4.0–10.5)
nRBC: 0 % (ref 0.0–0.2)

## 2021-09-04 LAB — GLUCOSE, CAPILLARY
Glucose-Capillary: 132 mg/dL — ABNORMAL HIGH (ref 70–99)
Glucose-Capillary: 132 mg/dL — ABNORMAL HIGH (ref 70–99)
Glucose-Capillary: 141 mg/dL — ABNORMAL HIGH (ref 70–99)
Glucose-Capillary: 146 mg/dL — ABNORMAL HIGH (ref 70–99)
Glucose-Capillary: 147 mg/dL — ABNORMAL HIGH (ref 70–99)
Glucose-Capillary: 161 mg/dL — ABNORMAL HIGH (ref 70–99)
Glucose-Capillary: 182 mg/dL — ABNORMAL HIGH (ref 70–99)
Glucose-Capillary: 184 mg/dL — ABNORMAL HIGH (ref 70–99)
Glucose-Capillary: 190 mg/dL — ABNORMAL HIGH (ref 70–99)

## 2021-09-04 LAB — BASIC METABOLIC PANEL
Anion gap: 9 (ref 5–15)
Anion gap: 8 (ref 5–15)
BUN: 20 mg/dL (ref 6–20)
BUN: 20 mg/dL (ref 6–20)
CO2: 21 mmol/L — ABNORMAL LOW (ref 22–32)
CO2: 21 mmol/L — ABNORMAL LOW (ref 22–32)
Calcium: 8.2 mg/dL — ABNORMAL LOW (ref 8.9–10.3)
Calcium: 8.1 mg/dL — ABNORMAL LOW (ref 8.9–10.3)
Chloride: 97 mmol/L — ABNORMAL LOW (ref 98–111)
Chloride: 97 mmol/L — ABNORMAL LOW (ref 98–111)
Creatinine, Ser: 1.3 mg/dL — ABNORMAL HIGH (ref 0.44–1.00)
Creatinine, Ser: 1.45 mg/dL — ABNORMAL HIGH (ref 0.44–1.00)
GFR, Estimated: 52 mL/min — ABNORMAL LOW (ref >60)
GFR, Estimated: 46 mL/min — ABNORMAL LOW (ref >60)
Glucose, Bld: 143 mg/dL — ABNORMAL HIGH (ref 70–99)
Glucose, Bld: 131 mg/dL — ABNORMAL HIGH (ref 70–99)
Potassium: 3.7 mmol/L (ref 3.5–5.1)
Potassium: 3.7 mmol/L (ref 3.5–5.1)
Sodium: 127 mmol/L — ABNORMAL LOW (ref 135–145)
Sodium: 126 mmol/L — ABNORMAL LOW (ref 135–145)

## 2021-09-04 LAB — LACTIC ACID, PLASMA: Lactic Acid, Venous: 1.2 mmol/L (ref 0.5–1.9)

## 2021-09-04 MED ORDER — SODIUM CHLORIDE 0.9% FLUSH
3.0000 mL | Freq: Two times a day (BID) | INTRAVENOUS | Status: DC
  Administered 2021-09-04 – 2021-09-12 (×10): 3 mL via INTRAVENOUS

## 2021-09-04 MED ORDER — INSULIN ASPART 100 UNIT/ML IJ SOLN
6.0000 [IU] | Freq: Three times a day (TID) | INTRAMUSCULAR | Status: DC
  Administered 2021-09-05 – 2021-09-12 (×12): 6 [IU] via SUBCUTANEOUS

## 2021-09-04 MED ORDER — ORAL CARE MOUTH RINSE
15.0000 mL | Freq: Two times a day (BID) | OROMUCOSAL | Status: DC
  Administered 2021-09-04 – 2021-09-13 (×18): 15 mL via OROMUCOSAL

## 2021-09-04 MED ORDER — SODIUM CHLORIDE 0.9% FLUSH: 3.0000 mL | INTRAVENOUS | Status: DC | PRN

## 2021-09-04 MED ORDER — SODIUM CHLORIDE 0.9 % IV SOLN: 250.0000 mL | INTRAVENOUS | Status: DC | PRN

## 2021-09-04 MED ORDER — OXYCODONE HCL 5 MG PO TABS
15.0000 mg | ORAL_TABLET | ORAL | Status: DC | PRN
  Administered 2021-09-04 – 2021-09-13 (×21): 15 mg via ORAL
  Filled 2021-09-04 (×21): qty 3

## 2021-09-04 MED ORDER — INSULIN ASPART 100 UNIT/ML IJ SOLN
0.0000 [IU] | Freq: Three times a day (TID) | INTRAMUSCULAR | Status: DC
  Administered 2021-09-04: 3 [IU] via SUBCUTANEOUS
  Administered 2021-09-04 (×2): 4 [IU] via SUBCUTANEOUS
  Administered 2021-09-05: 11 [IU] via SUBCUTANEOUS
  Administered 2021-09-05: 4 [IU] via SUBCUTANEOUS
  Administered 2021-09-05: 7 [IU] via SUBCUTANEOUS
  Administered 2021-09-06: 3 [IU] via SUBCUTANEOUS
  Administered 2021-09-06: 4 [IU] via SUBCUTANEOUS
  Administered 2021-09-07: 3 [IU] via SUBCUTANEOUS
  Administered 2021-09-09: 4 [IU] via SUBCUTANEOUS
  Administered 2021-09-09 – 2021-09-10 (×4): 3 [IU] via SUBCUTANEOUS
  Administered 2021-09-11: 4 [IU] via SUBCUTANEOUS
  Administered 2021-09-12: 3 [IU] via SUBCUTANEOUS
  Administered 2021-09-13: 7 [IU] via SUBCUTANEOUS

## 2021-09-04 MED ORDER — INSULIN GLARGINE-YFGN 100 UNIT/ML ~~LOC~~ SOLN
30.0000 [IU] | SUBCUTANEOUS | Status: DC
  Administered 2021-09-04 – 2021-09-05 (×2): 30 [IU] via SUBCUTANEOUS
  Filled 2021-09-04 (×2): qty 0.3

## 2021-09-04 NOTE — Assessment & Plan Note (Addendum)
-  Continue metoprolol.   -Lisinopril on  hold at this time.

## 2021-09-04 NOTE — Progress Notes (Addendum)
PROGRESS NOTE    TRISIA BOWELL  [id number] DOB: 04/17/1976 DOA: 09/03/2021 PCP: Pablo Lawrence, NP    Brief Narrative:  Karen Bennett is a 46 y.o. female with medical history significant of anxiety, depression, cellulitis of the lower extremities, type 2 diabetes, infective endocarditis with MSSA, history of DVT, history of PE not taking anticoagulation, hypertension, mixed hypertriglyceridemia, class III obesity with a BMI of 68.11 kg/m, tobacco use, right BKA, active smoker, noncompliance with medical treatment  who last took her medications at least a month ago presented to hospital with nausea vomiting diarrhea right-sided abdominal pain with polyuria fever and chills.  Had low-grade fever on presentation.  She had leukocytosis.  Lipase was normal.  Sodium was low at 123 and glucose was elevated at 603.  LFTs showed low albumin.  Chest x-ray without any acute findings.  CT scan of the abdomen pelvis showed chronically obstructing 1.3 x 1.0 cm nephrolith at the right UP junction.  Patient was then considered for admission to hospital for further evaluation and treatment.    Assessment and Plan: * Sepsis secondary to UTI [facility])- (present on admission) Status post ureteric stone removal and stent placement.  Continue IV fluids cefepime follow urine cultures blood cultures.  Obstruction of right ureteropelvic junction (UPJ) due to stone- (present on admission) Status post cystoscopy with right retrograde ureteropyelogram and insertion of double-J stent on 09/03/2021 by Dr. Diona Fanti urology.  Diabetic hyperosmolar non-ketotic state ([place])- (present on admission) Continue IV fluids antiemetics and sliding scale insulin long-acting insulin.  Closely monitor.  Latest POC glucose of 141  Hyponatremia- (present on admission) Sodium of 127.  Could be secondary to volume depletion.  Continue with IV hydration.  Acute renal insufficiency Continue to hydrate.  Latest creatinine of 1.4.  HTN  (hypertension)- (present on admission) Continue metoprolol.  Lisinopril hold at this time.   HLD (hyperlipidemia)- (present on admission) Continue Lipitor 40  Diabetic peripheral neuropathy associated with type 2 diabetes mellitus ([place])- (present on admission) Continue supportive care  BMI 60.0-69.9, adult ([place]) Morbid obesity.  Will benefit from weight loss as outpatient.  Hx of right BKA ([place]) Supportive care.  Patient does have home health aide and wheelchair at baseline.  Tobacco abuse- (present on admission) Declined that nicotine patch.  Noncompliance with treatment plan Patient states that she was low in mood and was not taking her medications recently.  Emphasized compliance with medication including insulin.    DVT prophylaxis:  rivaroxaban (XARELTO) tablet 20 mg   Code Status:     Code Status: Full Code  Disposition: Full code Status is: Inpatient  Remains inpatient appropriate because: Status post ureteric intervention, uncontrolled diabetes,   Family Communication: Spoke with the patient at bedside.  Consultants:  Urology  Procedures:  Cystoscopy with right retrograde ureteropyelogram with placement of double-J stent on 09/03/2021  Antimicrobials:  Cefepime 2/11>  Anti-infectives (From admission, onward)    Start     Dose/Rate Route Frequency Ordered Stop   09/03/21 2000  ceFEPIme (MAXIPIME) 2 g in sodium chloride 0.9 % 100 mL IVPB        2 g 200 mL/hr over 30 Minutes Intravenous Every 8 hours 09/03/21 1557 09/10/21 1959   09/03/21 1145  ceFEPIme (MAXIPIME) 2 g in sodium chloride 0.9 % 100 mL IVPB        2 g 200 mL/hr over 30 Minutes Intravenous  Once 09/03/21 1133 09/03/21 1208   09/03/21 1145  metroNIDAZOLE (FLAGYL) IVPB 500 mg  500 mg 100 mL/hr over 60 Minutes Intravenous  Once 09/03/21 1133 09/03/21 1348      Subjective: Today, patient was seen and examined at bedside.  Patient complains of mild nausea and right abdominal pain  especially over the suprapubic area.  Objective: Vitals:   09/04/21 0521 09/04/21 0530 09/04/21 0600 09/04/21 0800  BP: (!) 91/44 (!) 93/46 (!) 108/58   Pulse: 85 85 91   Resp: 18 (!) 21 18   Temp:    98.6 F (37 C)  TempSrc:    Oral  SpO2: 94% 93% 94%   Weight:      Height:        Intake/Output Summary (Last 24 hours) at 09/04/2021 1011 Last data filed at 09/04/2021 0946 Gross per 24 hour  Intake 1889.23 ml  Output 800 ml  Net 1089.23 ml   Filed Weights   09/03/21 0538  Weight: (!) 191.4 kg   Body mass index is 68.11 kg/m.   Physical Examination:  General: Morbidly obese built, not in obvious distress HENT:   No scleral pallor or icterus noted. Oral mucosa is moist.  Chest:  Clear breath sounds.  Diminished breath sounds bilaterally. No crackles or wheezes.  CVS: S1 &S2 heard. No murmur.  Regular rate and rhythm.  Tachycardia Abdomen: Soft, right upper quadrant tenderness, nondistended.  Bowel sounds are heard.   Extremities:  Right below-knee amputation.  Stage III lymphedema of the left lower extremity. Psych: Alert, awake and oriented, slightly low mood CNS:  No cranial nerve deficits.  Power equal in all extremities.   Skin: Warm and dry.  No rashes noted.  Data Reviewed:   CBC: Recent Labs  Lab 09/03/21 0540 09/04/21 0259  WBC 13.0* 11.6*  NEUTROABS 12.1* 10.3*  HGB 12.9 14.5  HCT 38.7 46.2*  MCV 85.2 90.2  PLT 322 [id number]    Basic Metabolic Panel: Recent Labs  Lab 09/03/21 0540 09/03/21 2035 09/03/21 2256 09/04/21 0259  NA 123* 127* 127* 126*  K 4.0 4.0 3.7 3.7  CL 90* 94* 97* 97*  CO2 20* 22 21* 21*  GLUCOSE 603* 156* 143* 131*  BUN 19 19 20 20   CREATININE 1.06* 1.31* 1.30* 1.45*  CALCIUM 8.5* 8.5* 8.2* 8.1*    Liver Function Tests: Recent Labs  Lab 09/03/21 0540  AST 15  ALT 10  ALKPHOS 91  BILITOT 0.9  PROT 7.5  ALBUMIN 2.1*     Radiology Studies: CT ABDOMEN PELVIS W CONTRAST  Result Date: 09/03/2021 CLINICAL DATA:   Abdominal pain for 4 days. EXAM: CT ABDOMEN AND PELVIS WITH CONTRAST TECHNIQUE: Multidetector CT imaging of the abdomen and pelvis was performed using the standard protocol following bolus administration of intravenous contrast. RADIATION DOSE REDUCTION: This exam was performed according to the departmental dose-optimization program which includes automated exposure control, adjustment of the mA and/or kV according to patient size and/or use of iterative reconstruction technique. CONTRAST:  164mL OMNIPAQUE IOHEXOL 300 MG/ML  SOLN COMPARISON:  CT 11/10/2017 FINDINGS: Lower chest: Coronary artery atherosclerosis.  No acute findings. Hepatobiliary: Hepatomegaly. Liver measures approximately 27 cm in length. No focal liver lesion identified. There are a few tiny layering stones within the gallbladder. No appreciable pericholecystic inflammatory changes by CT. Pancreas: Unremarkable. No pancreatic ductal dilatation or surrounding inflammatory changes. Spleen: Spleen is enlarged measuring approximately 14 cm in length. Scattered punctate calcified granulomas within the spleen. Adrenals/Urinary Tract: Unremarkable adrenal glands. Chronically obstructing 1.3 x 1.0 cm stone at the right ureteropelvic junction resulting in severe  right-sided hydronephrosis. There is air within the renal pelvis and intrarenal calices. Extensive right-sided urothelial enhancement. There is a complex intraparenchymal fluid and air collection in the lower pole of the right kidney measuring approximately 4.4 x 3.4 x 3.7 cm (series 2, image 59). Extensive right-sided perinephric stranding. Small calcification within the upper pole of the left kidney, likely vascular. Left kidney is otherwise unremarkable. No hydronephrosis. There is a small amount of air within the nondependent portion of the urinary bladder lumen. Stomach/Bowel: Stomach is within normal limits. No evidence of bowel wall thickening, distention, or inflammatory changes.  Vascular/Lymphatic: Extensive age advanced atherosclerotic calcifications throughout the aortoiliac axis. Multiple borderline enlarged retroperitoneal lymph nodes, likely reactive. Reproductive: No acute findings. Other: No ascites.  No pneumoperitoneum. Musculoskeletal: No acute osseous findings. Degenerative spondylosis with grade 1 anterolisthesis of L5 on S1. IMPRESSION: 1. Chronically obstructing 1.3 x 1.0 cm stone at the right ureteropelvic junction resulting in severe right-sided hydronephrosis. There is extensive urothelial enhancement as well as air within the right renal pelvis and intrarenal calices compatible with infection. There is a complex intraparenchymal fluid and air collection in the lower pole of the right kidney measuring up to 4.4 cm, suspicious for developing intrarenal abscess. Urology consultation is recommended. 2. Small amount of air within the non-dependent portion of the urinary bladder lumen, which may be secondary to recent instrumentation versus infection. 3. Hepatosplenomegaly. 4. Cholelithiasis without evidence of acute cholecystitis. 5. Aortic atherosclerosis, age advanced (ICD10-I70.0). These results were called by telephone at the time of interpretation on 09/03/2021 at 2:28 pm to provider MADISON REDWINE, PA, who verbally acknowledged these results. Electronically Signed   By: Davina Poke D.O.   On: 09/03/2021 14:30   DG Chest Portable 1 View  Result Date: 09/03/2021 CLINICAL DATA:  46 year old female with history of abdominal pain, nausea, vomiting and diarrhea. EXAM: PORTABLE CHEST 1 VIEW COMPARISON:  Chest x-ray 02/01/2021. FINDINGS: Lung volumes are normal. No consolidative airspace disease. No pleural effusions. No pneumothorax. No pulmonary nodule or mass noted. Pulmonary vasculature and the cardiomediastinal silhouette are within normal limits. IMPRESSION: No radiographic evidence of acute cardiopulmonary disease. Electronically Signed   By: Vinnie Langton M.D.    On: 09/03/2021 07:08   DG C-Arm 1-60 Min-No Report  Result Date: 09/03/2021 Fluoroscopy was utilized by the requesting physician.  No radiographic interpretation.   DG C-Arm 1-60 Min-No Report  Result Date: 09/03/2021 Fluoroscopy was utilized by the requesting physician.  No radiographic interpretation.   [place] EKG SITE RITE  Result Date: 09/03/2021 If Site Rite image not attached, placement could not be confirmed due to current cardiac rhythm.     LOS: 1 day    Flora Lipps, MD [facility] 09/04/2021, 10:11 AM

## 2021-09-04 NOTE — Assessment & Plan Note (Addendum)
Declined nicotine patch 

## 2021-09-04 NOTE — Assessment & Plan Note (Addendum)
Continue supportive care

## 2021-09-04 NOTE — Assessment & Plan Note (Addendum)
°-  Likely contrast induced nephropathy, patient was given IV contrast on 09/03/2021 for CT abdomen/pelvis -Creatinine jumped from 1.04 on 09/03/2021 to 2.31 on 09/06/2021 -Creatinine is down to 1.65 after she was started on normal saline at 100 mL/h -We will check BMP today -Renal ultrasound shows medical renal disease -We will discontinue  Foley catheter and obtain voiding trial; discussed with Dr. Retta Diones

## 2021-09-04 NOTE — Assessment & Plan Note (Addendum)
-  Status post ureteric stone removal and stent placement.    -Blood cultures showed candida glabrata. Urine culture showed no growth.  - Patient started on Eraxis, cefepime discontinued by ID.   -Repeat blood cultures are negative to date, ID recommends putting PICC line.  We will consult IR for PICC line placement.   -PICC line could not be placed by IV team, IR consulted -Plan for PICC line today -ID recommend to continue with antifungal till September 30, 2021  -Dr. Tyson Babinski spoke with Dr. Retta Diones urology as well who recommended continuation of treatment

## 2021-09-04 NOTE — Hospital Course (Addendum)
Karen Bennett is a 46 y.o. female with medical history significant of anxiety, depression, cellulitis of the lower extremities, type 2 diabetes, infective endocarditis with MSSA, history of DVT, history of PE not taking anticoagulation, hypertension, mixed hypertriglyceridemia, class III obesity with a BMI of 68.11 kg/m, tobacco use, right BKA, active smoker, noncompliance with medical treatment  who last took her medications at least a month ago presented to hospital with nausea, vomiting, diarrhea, right-sided abdominal pain with polyuria fever and chills.  Patient had low-grade fever and leukocytosis on presentation.  Lipase was normal.  Sodium was low at 123 and glucose was elevated at 603.  LFTs showed low albumin.  Chest x-ray without any acute findings.  CT scan of the abdomen and pelvis showed chronically obstructing 1.3 x 1.0 cm nephrolith at the right UP junction.  During hospitalization, patient underwent right stent placement by urology.  Blood cultures were negative but patient continued to have fever.  Renal function has trended up from initial improvement.  Blood cultures showing Candida.  ID has been consulted including urology at this time.

## 2021-09-04 NOTE — Assessment & Plan Note (Addendum)
-  Sodium was 122 , improved to 130 today after she was put on fluid restriction of 1.5 L/day -She presented with sodium of 123 -Glucose has normalized -Likely increased free water intake and osmotic diuresis from hyperglycemia -urine osmolality is 169, TSH 3.731

## 2021-09-04 NOTE — Assessment & Plan Note (Addendum)
Patient states that she was low in mood and was not taking her medications recently.  Emphasized compliance with medication including insulin.

## 2021-09-04 NOTE — Assessment & Plan Note (Addendum)
-  Resolved  at this time.   -Continue sliding scale insulin, long-acting insulin Accu-Cheks, diabetic diet. -CBG well controlled

## 2021-09-04 NOTE — Progress Notes (Signed)
Inpatient Diabetes Program Recommendations  AACE/ADA: New Consensus Statement on Inpatient Glycemic Control (2015)  Target Ranges:  Prepandial:   less than 140 mg/dL      Peak postprandial:   less than 180 mg/dL (1-2 hours)      Critically ill patients:  140 - 180 mg/dL   Lab Results  Component Value Date   GLUCAP 132 (H) 09/04/2021   HGBA1C 14.6 (H) 09/03/2021    Review of Glycemic Control  Diabetes history: type 2 Outpatient Diabetes medications: Levemir 60 units at HS, Humalog 0-12 units sliding scale TID (not taking either one recently) Current orders for Inpatient glycemic control: Semglee 30 units daily, Novolog 0-20 units correction scale TID, Novolog 6 units TID  Inpatient Diabetes Program Recommendations:   Received diabetes coordinator consult. Agree with current insulin orders. Will continue to follow blood sugar trends. May need to titrate dosages as needed.  Harvel Ricks RN BSN CDE Diabetes Coordinator Pager: [phone]  8am-5pm

## 2021-09-04 NOTE — Assessment & Plan Note (Addendum)
-  Morbid obesity.   -Will benefit from weight loss as outpatient.

## 2021-09-04 NOTE — Assessment & Plan Note (Addendum)
-  Continue Lipitor 40 milligrams daily.

## 2021-09-04 NOTE — Plan of Care (Signed)
  Problem: Nutrition: Goal: Adequate nutrition will be maintained Outcome: Progressing   Problem: Coping: Goal: Level of anxiety will decrease Outcome: Progressing   Problem: Elimination: Goal: Will not experience complications related to bowel motility Outcome: Progressing Goal: Will not experience complications related to urinary retention Outcome: Progressing   Problem: Pain Managment: Goal: General experience of comfort will improve Outcome: Progressing   

## 2021-09-04 NOTE — Assessment & Plan Note (Addendum)
-  Supportive care.   -Patient does have home health aide and wheelchair at baseline.

## 2021-09-04 NOTE — Assessment & Plan Note (Addendum)
-  Status post cystoscopy with right retrograde ureteropyelogram and insertion of double-J stent on 09/03/2021 by Dr. Retta Diones urology.  - Continue antifungals for now

## 2021-09-05 DIAGNOSIS — E1100 Type 2 diabetes mellitus with hyperosmolarity without nonketotic hyperglycemic-hyperosmolar coma (NKHHC): Secondary | ICD-10-CM | POA: Diagnosis not present

## 2021-09-05 DIAGNOSIS — Z6841 Body Mass Index (BMI) 40.0 and over, adult: Secondary | ICD-10-CM | POA: Diagnosis not present

## 2021-09-05 DIAGNOSIS — A419 Sepsis, unspecified organism: Secondary | ICD-10-CM | POA: Diagnosis not present

## 2021-09-05 DIAGNOSIS — L89102 Pressure ulcer of unspecified part of back, stage 2: Secondary | ICD-10-CM | POA: Diagnosis present

## 2021-09-05 DIAGNOSIS — N289 Disorder of kidney and ureter, unspecified: Secondary | ICD-10-CM | POA: Diagnosis not present

## 2021-09-05 LAB — GLUCOSE, CAPILLARY
Glucose-Capillary: 183 mg/dL — ABNORMAL HIGH (ref 70–99)
Glucose-Capillary: 201 mg/dL — ABNORMAL HIGH (ref 70–99)
Glucose-Capillary: 264 mg/dL — ABNORMAL HIGH (ref 70–99)
Glucose-Capillary: 205 mg/dL — ABNORMAL HIGH (ref 70–99)
Glucose-Capillary: 170 mg/dL — ABNORMAL HIGH (ref 70–99)

## 2021-09-05 LAB — COMPREHENSIVE METABOLIC PANEL
ALT: 13 U/L (ref 0–44)
AST: 24 U/L (ref 15–41)
Albumin: 1.5 g/dL — ABNORMAL LOW (ref 3.5–5.0)
Alkaline Phosphatase: 68 U/L (ref 38–126)
Anion gap: 8 (ref 5–15)
BUN: 34 mg/dL — ABNORMAL HIGH (ref 6–20)
CO2: 20 mmol/L — ABNORMAL LOW (ref 22–32)
Calcium: 7.6 mg/dL — ABNORMAL LOW (ref 8.9–10.3)
Chloride: 95 mmol/L — ABNORMAL LOW (ref 98–111)
Creatinine, Ser: 2.04 mg/dL — ABNORMAL HIGH (ref 0.44–1.00)
GFR, Estimated: 30 mL/min — ABNORMAL LOW (ref >60)
Glucose, Bld: 230 mg/dL — ABNORMAL HIGH (ref 70–99)
Potassium: 3.8 mmol/L (ref 3.5–5.1)
Sodium: 123 mmol/L — ABNORMAL LOW (ref 135–145)
Total Bilirubin: 0.2 mg/dL — ABNORMAL LOW (ref 0.3–1.2)
Total Protein: 5.8 g/dL — ABNORMAL LOW (ref 6.5–8.1)

## 2021-09-05 LAB — CBC WITH DIFFERENTIAL/PLATELET
Abs Immature Granulocytes: 0.43 10*3/uL — ABNORMAL HIGH (ref 0.00–0.07)
Basophils Absolute: 0 10*3/uL (ref 0.0–0.1)
Basophils Relative: 1 %
Eosinophils Absolute: 0 10*3/uL (ref 0.0–0.5)
Eosinophils Relative: 0 %
HCT: 31.6 % — ABNORMAL LOW (ref 36.0–46.0)
Hemoglobin: 10.7 g/dL — ABNORMAL LOW (ref 12.0–15.0)
Immature Granulocytes: 5 %
Lymphocytes Relative: 13 %
Lymphs Abs: 1.1 10*3/uL (ref 0.7–4.0)
MCH: 28.6 pg (ref 26.0–34.0)
MCHC: 33.9 g/dL (ref 30.0–36.0)
MCV: 84.5 fL (ref 80.0–100.0)
Monocytes Absolute: 0.5 10*3/uL (ref 0.1–1.0)
Monocytes Relative: 6 %
Neutro Abs: 6 10*3/uL (ref 1.7–7.7)
Neutrophils Relative %: 75 %
Platelets: 248 10*3/uL (ref 150–400)
RBC: 3.74 MIL/uL — ABNORMAL LOW (ref 3.87–5.11)
RDW: 13.6 % (ref 11.5–15.5)
WBC: 8.1 10*3/uL (ref 4.0–10.5)
nRBC: 0 % (ref 0.0–0.2)

## 2021-09-05 LAB — MAGNESIUM: Magnesium: 2 mg/dL (ref 1.7–2.4)

## 2021-09-05 LAB — URINE CULTURE: Culture: NO GROWTH

## 2021-09-05 LAB — OSMOLALITY: Osmolality: 273 mOsm/kg — ABNORMAL LOW (ref 275–295)

## 2021-09-05 LAB — PHOSPHORUS: Phosphorus: 4.2 mg/dL (ref 2.5–4.6)

## 2021-09-05 MED ORDER — SODIUM CHLORIDE 0.9 % IV SOLN: INTRAVENOUS | Status: AC

## 2021-09-05 MED ORDER — INSULIN GLARGINE-YFGN 100 UNIT/ML ~~LOC~~ SOLN
40.0000 [IU] | SUBCUTANEOUS | Status: AC
  Administered 2021-09-06: 40 [IU] via SUBCUTANEOUS
  Filled 2021-09-05 (×2): qty 0.4

## 2021-09-05 NOTE — Progress Notes (Addendum)
PROGRESS NOTE    Karen Bennett  [id number] DOB: 05/23/1976 DOA: 09/03/2021 PCP: Pablo Lawrence, NP    Brief Narrative:  Karen Bennett is a 46 y.o. female with medical history significant of anxiety, depression, cellulitis of the lower extremities, type 2 diabetes, infective endocarditis with MSSA, history of DVT, history of PE not taking anticoagulation, hypertension, mixed hypertriglyceridemia, class III obesity with a BMI of 68.11 kg/m, tobacco use, right BKA, active smoker, noncompliance with medical treatment  who last took her medications at least a month ago presented to hospital with nausea, vomiting, diarrhea, right-sided abdominal pain with polyuria fever and chills.  Patient had low-grade fever and leukocytosis on presentation..  Lipase was normal.  Sodium was low at 123 and glucose was elevated at 603.  LFTs showed low albumin.  Chest x-ray without any acute findings.  CT scan of the abdomen and pelvis showed chronically obstructing 1.3 x 1.0 cm nephrolith at the right UP junction.  Patient was then considered for admission to hospital for further evaluation and treatment.  During hospitalization, patient underwent left stent placement by urology.  Blood cultures were negative bu does continue to have fever.  Renal function has trended up from initial improvement.    Assessment and Plan: * Sepsis secondary to UTI [facility])- (present on admission) Status post ureteric stone removal and stent placement.  Continue IV fluids, cefepime, blood cultures negative in 2 days.  Urine culture still pending.  Patient did have a fever of 1.1 F yesterday evening.  We will continue to monitor temperature curve.  Continue with IV cefepime for now.   Obstruction of right ureteropelvic junction (UPJ) due to stone- (present on admission) Status post cystoscopy with right retrograde ureteropyelogram and insertion of double-J stent on 09/03/2021 by Dr. Diona Fanti urology.  Continue antibiotic for now.   No leukocytosis but temperature max of 101.1 F yesterday at 5 PM.  To monitor closely.  Follow urine cultures.  Creatinine has slightly elevated today.  Continue IV fluids.  Continue antibiotics.  Check BMP in AM.  If rising trend might need ultrasound/urology reevaluation  Diabetic hyperosmolar non-ketotic state ([place])- (present on admission) Continue IV fluids for now.  No nausea vomiting.  Continue insulin regimen.  Patient was counseled regarding the need for taking her insulin regularly at home.  Hyponatremia- (present on admission) Sodium of 123 today from 126.  Continue with normal saline for 1 more day.  Check levels in a.m.  AKI (acute kidney injury) ([place]) Continue to hydrate.  Latest creatinine of 2.04, increased from creatinine of 1.4.  We will continue to monitor closely.  Might need renal ultrasound if renal function does not improve by tomorrow.  HTN (hypertension)- (present on admission) Continue metoprolol.  Lisinopril hold at this time.   HLD (hyperlipidemia)- (present on admission) Continue Lipitor 40 milligrams daily.  Diabetic peripheral neuropathy associated with type 2 diabetes mellitus ([place])- (present on admission) Continue supportive care  BMI 60.0-69.9, adult ([place]) Morbid obesity.  Will benefit from weight loss as outpatient.  Pressure injury of back, stage 2 ([place])- (present on admission) Pressure Injury 09/03/21 Sacrum Medial Stage 2 -  Partial thickness loss of dermis presenting as a shallow open injury with a red, pink wound bed without slough. (Active)  09/03/21 1845  Location: Sacrum  Location Orientation: Medial  Staging: Stage 2 -  Partial thickness loss of dermis presenting as a shallow open injury with a red, pink wound bed without slough.  Wound Description (Comments):   Present  on Admission: Yes     Pressure Injury 09/03/21 Buttocks Right Stage 2 -  Partial thickness loss of dermis presenting as a shallow open injury with a red, pink wound bed  without slough. (Active)  09/03/21 1845  Location: Buttocks  Location Orientation: Right  Staging: Stage 2 -  Partial thickness loss of dermis presenting as a shallow open injury with a red, pink wound bed without slough.  Wound Description (Comments):   Present on Admission: Yes   Continue wound care    Hx of right BKA ([place]) Supportive care.  Patient does have home health aide and wheelchair at baseline.  Tobacco abuse- (present on admission) Declined nicotine patch.  Noncompliance with treatment plan Patient states that she was low in mood and was not taking her medications recently.  Emphasized compliance with medication including insulin.    DVT prophylaxis:  rivaroxaban (XARELTO) tablet 20 mg   Code Status:     Code Status: Full Code  Disposition: Full code Status is: Inpatient  Remains inpatient appropriate because: Status post ureteric intervention, uncontrolled diabetes, fever, rising creatinine trend   Family Communication:  Spoke with the patient at bedside.  Consultants:  Urology  Procedures:  Cystoscopy with right retrograde ureteropyelogram with placement of double-J stent on 09/03/2021  Antimicrobials:  Cefepime 2/11>  Anti-infectives (From admission, onward)    Start     Dose/Rate Route Frequency Ordered Stop   09/03/21 2000  ceFEPIme (MAXIPIME) 2 g in sodium chloride 0.9 % 100 mL IVPB        2 g 200 mL/hr over 30 Minutes Intravenous Every 8 hours 09/03/21 1557 09/10/21 1959   09/03/21 1145  ceFEPIme (MAXIPIME) 2 g in sodium chloride 0.9 % 100 mL IVPB        2 g 200 mL/hr over 30 Minutes Intravenous  Once 09/03/21 1133 09/03/21 1208   09/03/21 1145  metroNIDAZOLE (FLAGYL) IVPB 500 mg        500 mg 100 mL/hr over 60 Minutes Intravenous  Once 09/03/21 1133 09/03/21 1348      Subjective: Today, patient was seen and examined at bedside.  Patient still comes in mild suprapubic pain.  Has not had a bowel movement.  Noted fever yesterday evening.     Objective: Vitals:   09/05/21 0400 09/05/21 0600 09/05/21 0734 09/05/21 0800  BP:  (!) 115/49  108/66  Pulse:  82  (!) 105  Resp:    18  Temp: 98.4 F (36.9 C)  98.2 F (36.8 C)   TempSrc:   Axillary   SpO2:  98%  97%  Weight:      Height:        Intake/Output Summary (Last 24 hours) at 09/05/2021 1118 Last data filed at 09/05/2021 0800 Gross per 24 hour  Intake 284.92 ml  Output 795 ml  Net -510.08 ml   Filed Weights   09/03/21 0538  Weight: (!) 191.4 kg   Body mass index is 68.11 kg/m.   Physical Examination:  General: Morbidly obese built, not in obvious distress HENT:   No scleral pallor or icterus noted. Oral mucosa is moist.  Chest:  Clear breath sounds.  Diminished breath sounds bilaterally. No crackles or wheezes.  CVS: S1 &S2 heard. No murmur.  Regular rate and rhythm. Abdomen: Soft, right upper quadrant tenderness, nondistended.  Bowel sounds are heard.   Extremities: lymphedema of the bilateral lower extremities.  Right below knee amputation.   Psych: Alert, awake and oriented, low mood. CNS:  No cranial nerve deficits.  Moving all extremities. Skin: Warm and dry.  No rashes noted.   Data Reviewed:   CBC: Recent Labs  Lab 09/03/21 0540 09/04/21 0259 09/05/21 0522  WBC 13.0* 11.6* 8.1  NEUTROABS 12.1* 10.3* 6.0  HGB 12.9 14.5 10.7*  HCT 38.7 46.2* 31.6*  MCV 85.2 90.2 84.5  PLT 322 218 [id number]    Basic Metabolic Panel: Recent Labs  Lab 09/03/21 0540 09/03/21 2035 09/03/21 2256 09/04/21 0259 09/05/21 0255  NA 123* 127* 127* 126* 123*  K 4.0 4.0 3.7 3.7 3.8  CL 90* 94* 97* 97* 95*  CO2 20* 22 21* 21* 20*  GLUCOSE 603* 156* 143* 131* 230*  BUN 19 19 20 20  34*  CREATININE 1.06* 1.31* 1.30* 1.45* 2.04*  CALCIUM 8.5* 8.5* 8.2* 8.1* 7.6*  MG  --   --   --   --  2.0  PHOS  --   --   --   --  4.2    Liver Function Tests: Recent Labs  Lab 09/03/21 0540 09/05/21 0255  AST 15 24  ALT 10 13  ALKPHOS 91 68  BILITOT 0.9 0.2*  PROT 7.5  5.8*  ALBUMIN 2.1* 1.5*     Radiology Studies: CT ABDOMEN PELVIS W CONTRAST  Result Date: 09/03/2021 CLINICAL DATA:  Abdominal pain for 4 days. EXAM: CT ABDOMEN AND PELVIS WITH CONTRAST TECHNIQUE: Multidetector CT imaging of the abdomen and pelvis was performed using the standard protocol following bolus administration of intravenous contrast. RADIATION DOSE REDUCTION: This exam was performed according to the departmental dose-optimization program which includes automated exposure control, adjustment of the mA and/or kV according to patient size and/or use of iterative reconstruction technique. CONTRAST:  159mL OMNIPAQUE IOHEXOL 300 MG/ML  SOLN COMPARISON:  CT 11/10/2017 FINDINGS: Lower chest: Coronary artery atherosclerosis.  No acute findings. Hepatobiliary: Hepatomegaly. Liver measures approximately 27 cm in length. No focal liver lesion identified. There are a few tiny layering stones within the gallbladder. No appreciable pericholecystic inflammatory changes by CT. Pancreas: Unremarkable. No pancreatic ductal dilatation or surrounding inflammatory changes. Spleen: Spleen is enlarged measuring approximately 14 cm in length. Scattered punctate calcified granulomas within the spleen. Adrenals/Urinary Tract: Unremarkable adrenal glands. Chronically obstructing 1.3 x 1.0 cm stone at the right ureteropelvic junction resulting in severe right-sided hydronephrosis. There is air within the renal pelvis and intrarenal calices. Extensive right-sided urothelial enhancement. There is a complex intraparenchymal fluid and air collection in the lower pole of the right kidney measuring approximately 4.4 x 3.4 x 3.7 cm (series 2, image 59). Extensive right-sided perinephric stranding. Small calcification within the upper pole of the left kidney, likely vascular. Left kidney is otherwise unremarkable. No hydronephrosis. There is a small amount of air within the nondependent portion of the urinary bladder lumen.  Stomach/Bowel: Stomach is within normal limits. No evidence of bowel wall thickening, distention, or inflammatory changes. Vascular/Lymphatic: Extensive age advanced atherosclerotic calcifications throughout the aortoiliac axis. Multiple borderline enlarged retroperitoneal lymph nodes, likely reactive. Reproductive: No acute findings. Other: No ascites.  No pneumoperitoneum. Musculoskeletal: No acute osseous findings. Degenerative spondylosis with grade 1 anterolisthesis of L5 on S1. IMPRESSION: 1. Chronically obstructing 1.3 x 1.0 cm stone at the right ureteropelvic junction resulting in severe right-sided hydronephrosis. There is extensive urothelial enhancement as well as air within the right renal pelvis and intrarenal calices compatible with infection. There is a complex intraparenchymal fluid and air collection in the lower pole of the right kidney measuring up to 4.4 cm, suspicious  for developing intrarenal abscess. Urology consultation is recommended. 2. Small amount of air within the non-dependent portion of the urinary bladder lumen, which may be secondary to recent instrumentation versus infection. 3. Hepatosplenomegaly. 4. Cholelithiasis without evidence of acute cholecystitis. 5. Aortic atherosclerosis, age advanced (ICD10-I70.0). These results were called by telephone at the time of interpretation on 09/03/2021 at 2:28 pm to provider MADISON REDWINE, PA, who verbally acknowledged these results. Electronically Signed   By: Davina Poke D.O.   On: 09/03/2021 14:30   DG C-Arm 1-60 Min-No Report  Result Date: 09/03/2021 Fluoroscopy was utilized by the requesting physician.  No radiographic interpretation.   DG C-Arm 1-60 Min-No Report  Result Date: 09/03/2021 Fluoroscopy was utilized by the requesting physician.  No radiographic interpretation.   [place] EKG SITE RITE  Result Date: 09/03/2021 If Site Rite image not attached, placement could not be confirmed due to current cardiac rhythm.      LOS: 2 days    Flora Lipps, MD [facility] 09/05/2021, 11:18 AM

## 2021-09-05 NOTE — Plan of Care (Signed)

## 2021-09-05 NOTE — Assessment & Plan Note (Signed)
Pressure Injury 09/03/21 Sacrum Medial Stage 2 -  Partial thickness loss of dermis presenting as a shallow open injury with a red, pink wound bed without slough. (Active)  09/03/21 1845  Location: Sacrum  Location Orientation: Medial  Staging: Stage 2 -  Partial thickness loss of dermis presenting as a shallow open injury with a red, pink wound bed without slough.  Wound Description (Comments):   Present on Admission: Yes     Pressure Injury 09/03/21 Buttocks Right Stage 2 -  Partial thickness loss of dermis presenting as a shallow open injury with a red, pink wound bed without slough. (Active)  09/03/21 1845  Location: Buttocks  Location Orientation: Right  Staging: Stage 2 -  Partial thickness loss of dermis presenting as a shallow open injury with a red, pink wound bed without slough.  Wound Description (Comments):   Present on Admission: Yes   Continue wound care

## 2021-09-05 NOTE — Progress Notes (Signed)
Pt was transferred via bed to new room 1321. Pt had all belongings in hand. Pt transferred without any complications. Pt had no questions regarding transfer. Report called to Floor nurse prior to transport. Pt's dinner tray taken to her new room.

## 2021-09-05 NOTE — Progress Notes (Addendum)
Inpatient Diabetes Program Recommendations  AACE/ADA: New Consensus Statement on Inpatient Glycemic Control (2015)  Target Ranges:  Prepandial:   less than 140 mg/dL      Peak postprandial:   less than 180 mg/dL (1-2 hours)      Critically ill patients:  140 - 180 mg/dL   Lab Results  Component Value Date   GLUCAP 264 (H) 09/05/2021   HGBA1C 14.6 (H) 09/03/2021    Review of Glycemic Control  Latest Reference Range & Units 09/04/21 17:27 09/04/21 20:38 09/05/21 08:12  Glucose-Capillary 70 - 99 mg/dL 732 (H) 202 (H) 542 (H)  (H): Data is abnormally high Diabetes history: type 2 Outpatient Diabetes medications: Levemir 60 units at HS, Humalog 0-12 units sliding scale TID (not taking either one recently) Current orders for Inpatient glycemic control: Semglee 30 units daily, Novolog 0-20 units correction scale TID, Novolog 6 units TID   Inpatient Diabetes Program Recommendations:    Consider increasing Semglee to 40 units QD.   Addendum: Spoke with patient regarding outpatient diabetes management. Patient has not been taking insulin as she has been struggling with depression. Reviewed patient's current A1c of 14.6%. Explained what a A1c is and what it measures. Also reviewed goal A1c with patient, importance of good glucose control @ home, and blood sugar goals. Reviewed patho of DM, need for insulin, role of pancreas, impact of infection risk with poor glycemic control, vascular change and commorbidities.  Patient has a meter and supplies. Is getting treatment for depression. Reviewed frequency of recommended CBG checks. Encouraged to set alarms on phone and reviewed goal setting to help assist.  Patient plans to make a PCP appointment for DM once improved for post hospitalization follow up. Has no further questions at this time.  Secure chat sent to MD with recs.  Thanks, Lujean Rave, MSN, RNC-OB Diabetes Coordinator [phone] (8a-5p)

## 2021-09-06 ENCOUNTER — Inpatient Hospital Stay (HOSPITAL_COMMUNITY): Payer: Medicare Other

## 2021-09-06 DIAGNOSIS — A419 Sepsis, unspecified organism: Secondary | ICD-10-CM | POA: Diagnosis not present

## 2021-09-06 DIAGNOSIS — R7881 Bacteremia: Secondary | ICD-10-CM | POA: Diagnosis not present

## 2021-09-06 DIAGNOSIS — B377 Candidal sepsis: Principal | ICD-10-CM

## 2021-09-06 DIAGNOSIS — Z6841 Body Mass Index (BMI) 40.0 and over, adult: Secondary | ICD-10-CM | POA: Diagnosis not present

## 2021-09-06 DIAGNOSIS — N390 Urinary tract infection, site not specified: Secondary | ICD-10-CM | POA: Diagnosis not present

## 2021-09-06 DIAGNOSIS — N179 Acute kidney failure, unspecified: Secondary | ICD-10-CM | POA: Diagnosis not present

## 2021-09-06 DIAGNOSIS — E1100 Type 2 diabetes mellitus with hyperosmolarity without nonketotic hyperglycemic-hyperosmolar coma (NKHHC): Secondary | ICD-10-CM | POA: Diagnosis not present

## 2021-09-06 DIAGNOSIS — L89102 Pressure ulcer of unspecified part of back, stage 2: Secondary | ICD-10-CM

## 2021-09-06 LAB — BLOOD CULTURE ID PANEL (REFLEXED) - BCID2

## 2021-09-06 LAB — PHOSPHORUS: Phosphorus: 4.7 mg/dL — ABNORMAL HIGH (ref 2.5–4.6)

## 2021-09-06 LAB — COMPREHENSIVE METABOLIC PANEL
ALT: 14 U/L (ref 0–44)
AST: 17 U/L (ref 15–41)
Albumin: 1.6 g/dL — ABNORMAL LOW (ref 3.5–5.0)
Alkaline Phosphatase: 64 U/L (ref 38–126)
Anion gap: 9 (ref 5–15)
BUN: 41 mg/dL — ABNORMAL HIGH (ref 6–20)
CO2: 18 mmol/L — ABNORMAL LOW (ref 22–32)
Calcium: 7.6 mg/dL — ABNORMAL LOW (ref 8.9–10.3)
Chloride: 94 mmol/L — ABNORMAL LOW (ref 98–111)
Creatinine, Ser: 2.31 mg/dL — ABNORMAL HIGH (ref 0.44–1.00)
GFR, Estimated: 26 mL/min — ABNORMAL LOW (ref >60)
Glucose, Bld: 146 mg/dL — ABNORMAL HIGH (ref 70–99)
Potassium: 3.9 mmol/L (ref 3.5–5.1)
Sodium: 121 mmol/L — ABNORMAL LOW (ref 135–145)
Total Bilirubin: 0.2 mg/dL — ABNORMAL LOW (ref 0.3–1.2)
Total Protein: 6.1 g/dL — ABNORMAL LOW (ref 6.5–8.1)

## 2021-09-06 LAB — CBC
HCT: 30.5 % — ABNORMAL LOW (ref 36.0–46.0)
Hemoglobin: 10 g/dL — ABNORMAL LOW (ref 12.0–15.0)
MCH: 28.2 pg (ref 26.0–34.0)
MCHC: 32.8 g/dL (ref 30.0–36.0)
MCV: 86.2 fL (ref 80.0–100.0)
Platelets: 220 10*3/uL (ref 150–400)
RBC: 3.54 MIL/uL — ABNORMAL LOW (ref 3.87–5.11)
RDW: 13.6 % (ref 11.5–15.5)
WBC: 6.7 10*3/uL (ref 4.0–10.5)
nRBC: 0 % (ref 0.0–0.2)

## 2021-09-06 LAB — ECHOCARDIOGRAM COMPLETE
AV Mean grad: 5 mmHg
AV Peak grad: 9 mmHg
Ao pk vel: 1.5 m/s
Area-P 1/2: 3.42 cm2
Height: 66 in
MV M vel: 4.09 m/s
MV Peak grad: 66.9 mmHg
Weight: 6752 oz

## 2021-09-06 LAB — GLUCOSE, CAPILLARY
Glucose-Capillary: 107 mg/dL — ABNORMAL HIGH (ref 70–99)
Glucose-Capillary: 110 mg/dL — ABNORMAL HIGH (ref 70–99)
Glucose-Capillary: 113 mg/dL — ABNORMAL HIGH (ref 70–99)
Glucose-Capillary: 139 mg/dL — ABNORMAL HIGH (ref 70–99)
Glucose-Capillary: 148 mg/dL — ABNORMAL HIGH (ref 70–99)
Glucose-Capillary: 149 mg/dL — ABNORMAL HIGH (ref 70–99)
Glucose-Capillary: 175 mg/dL — ABNORMAL HIGH (ref 70–99)

## 2021-09-06 LAB — TSH: TSH: 3.731 u[IU]/mL (ref 0.350–4.500)

## 2021-09-06 LAB — MAGNESIUM: Magnesium: 2 mg/dL (ref 1.7–2.4)

## 2021-09-06 LAB — OSMOLALITY: Osmolality: 270 mOsm/kg — ABNORMAL LOW (ref 275–295)

## 2021-09-06 MED ORDER — INSULIN GLARGINE-YFGN 100 UNIT/ML ~~LOC~~ SOLN
40.0000 [IU] | Freq: Every day | SUBCUTANEOUS | Status: DC
  Administered 2021-09-07 – 2021-09-13 (×7): 40 [IU] via SUBCUTANEOUS
  Filled 2021-09-06 (×7): qty 0.4

## 2021-09-06 MED ORDER — SODIUM CHLORIDE 0.9 % IV SOLN
200.0000 mg | Freq: Once | INTRAVENOUS | Status: AC
  Administered 2021-09-06: 200 mg via INTRAVENOUS
  Filled 2021-09-06: qty 200

## 2021-09-06 MED ORDER — SODIUM CHLORIDE 0.9 % IV SOLN
100.0000 mg | INTRAVENOUS | Status: DC
  Administered 2021-09-07 – 2021-09-13 (×7): 100 mg via INTRAVENOUS
  Filled 2021-09-06 (×9): qty 100

## 2021-09-06 MED ORDER — PERFLUTREN LIPID MICROSPHERE
1.0000 mL | INTRAVENOUS | Status: AC | PRN
  Administered 2021-09-06: 2 mL via INTRAVENOUS
  Filled 2021-09-06: qty 10

## 2021-09-06 NOTE — Progress Notes (Signed)
PROGRESS NOTE    Karen Bennett  [id number] DOB: Jan 11, 1976 DOA: 09/03/2021 PCP: Karen Lawrence, NP    Brief Narrative:  Karen Bennett is a 46 y.o. female with medical history significant of anxiety, depression, cellulitis of the lower extremities, type 2 diabetes, infective endocarditis with MSSA, history of DVT, history of PE not taking anticoagulation, hypertension, mixed hypertriglyceridemia, class III obesity with a BMI of 68.11 kg/m, tobacco use, right BKA, active smoker, noncompliance with medical treatment  who last took her medications at least a month ago presented to hospital with nausea, vomiting, diarrhea, right-sided abdominal pain with polyuria fever and chills.  Patient had low-grade fever and leukocytosis on presentation.  Lipase was normal.  Sodium was low at 123 and glucose was elevated at 603.  LFTs showed low albumin.  Chest x-ray without any acute findings.  CT scan of the abdomen and pelvis showed chronically obstructing 1.3 x 1.0 cm nephrolith at the right UP junction.  During hospitalization, patient underwent right stent placement by urology.  Blood cultures were negative but patient continued to have fever.  Renal function has trended up from initial improvement.  Blood cultures showing Candida.  ID has been consulted including urology at this time.    Assessment and Plan: * Sepsis secondary to UTI [facility])- (present on admission) Status post ureteric stone removal and stent placement.    Blood cultures showed candida. Urine culture still pending.  Temperature max of 98.8 F.  We will continue to monitor temperature curve. Eraxis has been added to cefepime.  Spoke with Dr. Linus Bennett infectious disease for consultation.  Spoke with Dr. Diona Bennett urology as well who recommended continuation of treatment with antibacterial.   Obstruction of right ureteropelvic junction (UPJ) due to stone- (present on admission) Status post cystoscopy with right retrograde ureteropyelogram  and insertion of double-J stent on 09/03/2021 by Dr. Diona Bennett urology.  Continue antibiotic for now.  No leukocytosis.  Due to rising creatinine levels, we will get ultrasound of the right kidney.  Spoke with Dr. Diona Bennett urology today.  Diabetic hyperosmolar non-ketotic state ([place])- (present on admission) Improved at this time.  Continue sliding scale insulin, long-acting insulin Accu-Cheks, diabetic diet.  Hyponatremia- (present on admission) Sodium of 121 today from 123- 126.  Continue with normal saline for 1 day.  Check urinary labs, osmolality, TSH.  Blood glucose level controlled but sodium low.    AKI (acute kidney injury) ([place]) Continue to hydrate.  Latest creatinine of 2.3, increased from creatinine of 2.0< 1.4.  We will obtain renal ultrasound to look for any new hydronephrosis.  Continue to monitor strict intake and output charting.  Patient currently has a Foley catheter in place.  Might need nephrology evaluation if continues to worsen.  Patient did receive IV contrast while doing CT scan which could have exacerbated the situation.  In view of renal failure will avoid IV contrast for further assessment of the kidneys.  We will however obtain ultrasound at this time.  HTN (hypertension)- (present on admission) Continue metoprolol.  Lisinopril hold at this time.   HLD (hyperlipidemia)- (present on admission) Continue Lipitor 40 milligrams daily.  Diabetic peripheral neuropathy associated with type 2 diabetes mellitus ([place])- (present on admission) Continue supportive care  BMI 60.0-69.9, adult ([place]) Morbid obesity.  Will benefit from weight loss as outpatient.  Pressure injury of back, stage 2 ([place])- (present on admission) Pressure Injury 09/03/21 Sacrum Medial Stage 2 -  Partial thickness loss of dermis presenting as a shallow open injury with  a red, pink wound bed without slough. (Active)  09/03/21 1845  Location: Sacrum  Location Orientation: Medial  Staging: Stage 2 -   Partial thickness loss of dermis presenting as a shallow open injury with a red, pink wound bed without slough.  Wound Description (Comments):   Present on Admission: Yes     Pressure Injury 09/03/21 Buttocks Right Stage 2 -  Partial thickness loss of dermis presenting as a shallow open injury with a red, pink wound bed without slough. (Active)  09/03/21 1845  Location: Buttocks  Location Orientation: Right  Staging: Stage 2 -  Partial thickness loss of dermis presenting as a shallow open injury with a red, pink wound bed without slough.  Wound Description (Comments):   Present on Admission: Yes   Continue wound care    Hx of right BKA ([place]) Supportive care.  Patient does have home health aide and wheelchair at baseline.  Tobacco abuse- (present on admission) Declined nicotine patch.  Noncompliance with treatment plan Patient states that she was low in mood and was not taking her medications recently.  Emphasized compliance with medication including insulin.    DVT prophylaxis: rivaroxaban (XARELTO) tablet 20 mg Start: 09/03/21 1845 rivaroxaban (XARELTO) tablet 20 mg   Code Status:     Code Status: Full Code  Disposition: Full code Status is: Inpatient  Remains inpatient appropriate because: Status post ureteric intervention, Candida sepsis, worsening renal failure.   Family Communication:  Spoke with the patient at bedside.  I tried to call the patient's contact person listed in the computer but was unable to reach.  Consultants:  Urology  Procedures:  Cystoscopy with right retrograde ureteropyelogram with placement of double-J stent on 09/03/2021  Antimicrobials:  Cefepime 2/11> Eraxis 2/14>  Anti-infectives (From admission, onward)    Start     Dose/Rate Route Frequency Ordered Stop   09/07/21 0400  anidulafungin (ERAXIS) 100 mg in sodium chloride 0.9 % 100 mL IVPB       See Hyperspace for full Linked Orders Report.   100 mg 78 mL/hr over 100 Minutes  Intravenous Every 24 hours 09/06/21 0248     09/06/21 0400  anidulafungin (ERAXIS) 200 mg in sodium chloride 0.9 % 200 mL IVPB       See Hyperspace for full Linked Orders Report.   200 mg 78 mL/hr over 200 Minutes Intravenous  Once 09/06/21 0248 09/06/21 0722   09/03/21 2000  ceFEPIme (MAXIPIME) 2 g in sodium chloride 0.9 % 100 mL IVPB        2 g 200 mL/hr over 30 Minutes Intravenous Every 8 hours 09/03/21 1557 09/10/21 1959   09/03/21 1145  ceFEPIme (MAXIPIME) 2 g in sodium chloride 0.9 % 100 mL IVPB        2 g 200 mL/hr over 30 Minutes Intravenous  Once 09/03/21 1133 09/03/21 1208   09/03/21 1145  metroNIDAZOLE (FLAGYL) IVPB 500 mg        500 mg 100 mL/hr over 60 Minutes Intravenous  Once 09/03/21 1133 09/03/21 1348      Subjective: Today, patient was seen and examined at bedside.  Patient denies overt pain, nausea, vomiting, fever or chills.    Objective: Vitals:   09/05/21 1658 09/05/21 2013 09/06/21 0012 09/06/21 0304  BP: (!) 124/54 (!) 97/52 103/67 (!) 119/56  Pulse: 73 77 83 83  Resp: 13 18  18   Temp: 98.8 F (37.1 C) 98.7 F (37.1 C)  97.6 F (36.4 C)  TempSrc: Oral Oral  Oral  SpO2: 100% 94%  94%  Weight:      Height:        Intake/Output Summary (Last 24 hours) at 09/06/2021 1028 Last data filed at 09/06/2021 0600 Gross per 24 hour  Intake 2953.74 ml  Output 1300 ml  Net 1653.74 ml   Filed Weights   09/03/21 0538  Weight: (!) 191.4 kg   Body mass index is 68.11 kg/m.   Physical Examination:  General: Morbidly obese built, not in obvious distress HENT:   No scleral pallor or icterus noted. Oral mucosa is moist.  Chest:  Clear breath sounds.  Diminished breath sounds bilaterally. No crackles or wheezes.  CVS: S1 &S2 heard. No murmur.  Regular rate and rhythm. Abdomen: Soft, nontender, nondistended.  Bowel sounds are heard.  Foley catheter in place. Extremities: No cyanosis, clubbing right below-knee amputation, edema of the left lower extremity Psych:  Alert, awake and oriented, normal mood CNS:  No cranial nerve deficits.  Moving extremities. Skin: Warm and dry.  No rashes noted.   Data Reviewed:   CBC: Recent Labs  Lab 09/03/21 0540 09/04/21 0259 09/05/21 0522 09/06/21 0426  WBC 13.0* 11.6* 8.1 6.7  NEUTROABS 12.1* 10.3* 6.0  --   HGB 12.9 14.5 10.7* 10.0*  HCT 38.7 46.2* 31.6* 30.5*  MCV 85.2 90.2 84.5 86.2  PLT 322 218 248 [id number]    Basic Metabolic Panel: Recent Labs  Lab 09/03/21 2035 09/03/21 2256 09/04/21 0259 09/05/21 0255 09/06/21 0426  NA 127* 127* 126* 123* 121*  K 4.0 3.7 3.7 3.8 3.9  CL 94* 97* 97* 95* 94*  CO2 22 21* 21* 20* 18*  GLUCOSE 156* 143* 131* 230* 146*  BUN 19 20 20  34* 41*  CREATININE 1.31* 1.30* 1.45* 2.04* 2.31*  CALCIUM 8.5* 8.2* 8.1* 7.6* 7.6*  MG  --   --   --  2.0 2.0  PHOS  --   --   --  4.2 4.7*    Liver Function Tests: Recent Labs  Lab 09/03/21 0540 09/05/21 0255 09/06/21 0426  AST 15 24 17   ALT 10 13 14   ALKPHOS 91 68 64  BILITOT 0.9 0.2* 0.2*  PROT 7.5 5.8* 6.1*  ALBUMIN 2.1* 1.5* 1.6*     Radiology Studies: No results found.    LOS: 3 days    Flora Lipps, MD [facility] 09/06/2021, 10:28 AM

## 2021-09-06 NOTE — Progress Notes (Signed)
PHARMACY - PHYSICIAN COMMUNICATION CRITICAL VALUE ALERT - BLOOD CULTURE IDENTIFICATION (BCID)  Karen Bennett is an 46 y.o. female who presented to [facility] on 09/03/2021 with a chief complaint of abdominal pain, fever, hyperglycemia & elevated BP.   Assessment:   Patient admitted with obstructing kidney stone s/p stone removal & stent placement on 09/03/21.   She continues to spike fevers on Cefepime, leukocytosis is improving.  Blood cx now + yeast identified as C. Glabrata with no resistance on BCID.   Name of physician (or Provider) Contacted: Johann Capers  Current antibiotics: Cefepime 2gm IV q8h  Changes to prescribed antibiotics recommended:  Add Anidulafungin 200mg  IV x1 then 100mg  IV daily Narrow vs. discontinue Cefepime to be deferred to rounding MD/ID F/U ID recommendations  Results for orders placed or performed during the hospital encounter of 09/03/21  Blood Culture ID Panel (Reflexed) (Collected: 09/03/2021  1:04 PM)  Result Value Ref Range   Enterococcus faecalis NOT DETECTED NOT DETECTED   Enterococcus Faecium NOT DETECTED NOT DETECTED   Listeria monocytogenes NOT DETECTED NOT DETECTED   Staphylococcus species NOT DETECTED NOT DETECTED   Staphylococcus aureus (BCID) NOT DETECTED NOT DETECTED   Staphylococcus epidermidis NOT DETECTED NOT DETECTED   Staphylococcus lugdunensis NOT DETECTED NOT DETECTED   Streptococcus species NOT DETECTED NOT DETECTED   Streptococcus agalactiae NOT DETECTED NOT DETECTED   Streptococcus pneumoniae NOT DETECTED NOT DETECTED   Streptococcus pyogenes NOT DETECTED NOT DETECTED   A.calcoaceticus-baumannii NOT DETECTED NOT DETECTED   Bacteroides fragilis NOT DETECTED NOT DETECTED   Enterobacterales NOT DETECTED NOT DETECTED   Enterobacter cloacae complex NOT DETECTED NOT DETECTED   Escherichia coli NOT DETECTED NOT DETECTED   Klebsiella aerogenes NOT DETECTED NOT DETECTED   Klebsiella oxytoca NOT DETECTED NOT DETECTED   Klebsiella  pneumoniae NOT DETECTED NOT DETECTED   Proteus species NOT DETECTED NOT DETECTED   Salmonella species NOT DETECTED NOT DETECTED   Serratia marcescens NOT DETECTED NOT DETECTED   Haemophilus influenzae NOT DETECTED NOT DETECTED   Neisseria meningitidis NOT DETECTED NOT DETECTED   Pseudomonas aeruginosa NOT DETECTED NOT DETECTED   Stenotrophomonas maltophilia NOT DETECTED NOT DETECTED   Candida albicans NOT DETECTED NOT DETECTED   Candida auris NOT DETECTED NOT DETECTED   Candida glabrata DETECTED (A) NOT DETECTED   Candida krusei NOT DETECTED NOT DETECTED   Candida parapsilosis NOT DETECTED NOT DETECTED   Candida tropicalis NOT DETECTED NOT DETECTED   Cryptococcus neoformans/gattii NOT DETECTED NOT DETECTED    11/01/21 PharmD 09/06/2021  2:13 AM

## 2021-09-06 NOTE — Plan of Care (Signed)
°  Problem: Nutrition: Goal: Adequate nutrition will be maintained Outcome: Not Progressing   Problem: Activity: Goal: Risk for activity intolerance will decrease Outcome: Not Progressing   Problem: Clinical Measurements: Goal: Will remain free from infection Outcome: Not Progressing

## 2021-09-06 NOTE — Consult Note (Signed)
[name] for Infectious Disease       Reason for Consult: Candidemia    Referring Physician: Dr. Louanne Belton  Principal Problem:   Sepsis secondary to UTI [facility]) Active Problems:   Hyponatremia   Diabetic peripheral neuropathy associated with type 2 diabetes mellitus (HCC)   HLD (hyperlipidemia)   AKI (acute kidney injury) ([place])   BMI 60.0-69.9, adult (HCC)   HTN (hypertension)   Diabetic hyperosmolar non-ketotic state ([place])   Noncompliance with treatment plan   Tobacco abuse   Hx of right BKA (HCC)   Obstruction of right ureteropelvic junction (UPJ) due to stone   Pressure injury of back, stage 2 (HCC)    atorvastatin  40 mg Oral q1800   buPROPion ER  100 mg Oral q morning   Chlorhexidine Gluconate Cloth  6 each Topical Daily   insulin aspart  0-20 Units Subcutaneous TID WC   insulin aspart  6 Units Subcutaneous TID WC   [START ON 09/07/2021] insulin glargine-yfgn  40 Units Subcutaneous Daily   mouth rinse  15 mL Mouth Rinse BID   rivaroxaban  20 mg Oral QPM   sodium chloride flush  3 mL Intravenous Q12H   traZODone  150 mg Oral QHS    Recommendations: Continue anidulafungin Will stop cefepime TTE  Assessment: She has an obstructing stone s/p double-J stent placement, fever and chills and now blood cultures with Candida.  Will treat and monitor.  Will check a TTE.  No visual disturbances so no acute indication for eye exam at this time.  C glabrata known to have resistance to azoles so will continue with anidulafungin pending sensitivities.    Antibiotics: Cefepime 3 days Starting anidulafungin  HPI: Karen Bennett is a 46 y.o. female with a history of multiple medical issues and nephrolithiasis requiring stent placement now with dysuria, nausea, vomiting, fever and chills.  Tmax of 103.2, WBC 13.0 initially.  Double-J stent placed by Dr. Diona Fanti on 09/03/21.  Blood cultures sent then as well and now positive as above with Candida glabrata on BCID.  Urine  culture without any growth.  Started on anidulafungin.  No complaints now.  No visual disturbances.  CT with abscess and gas in the collecting system.    Review of Systems:  Constitutional: negative for fevers and chills Gastrointestinal: negative for nausea and diarrhea Integument/breast: negative for rash All other systems reviewed and are negative    Past Medical History:  Diagnosis Date   Anxiety and depression 06/26/2013   Cellulitis    Diabetes mellitus    Infective endocarditis    PE (pulmonary embolism) ~2007-2008   Not on anticoagulation    Social History   Tobacco Use   Smoking status: Never   Smokeless tobacco: Never  Substance Use Topics   Alcohol use: No   Drug use: No    Family History  Problem Relation Age of Onset   Diabetes Mother    Coronary artery disease Mother    Stroke Mother     Allergies  Allergen Reactions   Clindamycin Nausea And Vomiting   Zofran Itching and Nausea And Vomiting   Doxycycline Nausea And Vomiting   Morphine And Related Itching    Physical Exam: Constitutional: in no apparent distress  Vitals:   09/06/21 0304 09/06/21 1044  BP: (!) 119/56 132/71  Pulse: 83 82  Resp: 18 16  Temp: 97.6 F (36.4 C) 98.2 F (36.8 C)  SpO2: 94% 96%   EYES: anicteric ENMT: no thrush  Cardiovascular: Cor RRR Respiratory: clear; GI: soft, obese Musculoskeletal: no edema Skin: no rashes Neuro: non-focal  Lab Results  Component Value Date   WBC 6.7 09/06/2021   HGB 10.0 (L) 09/06/2021   HCT 30.5 (L) 09/06/2021   MCV 86.2 09/06/2021   PLT 220 09/06/2021    Lab Results  Component Value Date   CREATININE 2.31 (H) 09/06/2021   BUN 41 (H) 09/06/2021   NA 121 (L) 09/06/2021   K 3.9 09/06/2021   CL 94 (L) 09/06/2021   CO2 18 (L) 09/06/2021    Lab Results  Component Value Date   ALT 14 09/06/2021   AST 17 09/06/2021   ALKPHOS 64 09/06/2021     Microbiology: Recent Results (from the past 240 hour(s))  Resp Panel by RT-PCR  (Flu A&B, Covid) Nasopharyngeal Swab     Status: None   Collection Time: 09/03/21  5:57 AM   Specimen: Nasopharyngeal Swab; Nasopharyngeal(NP) swabs in vial transport medium  Result Value Ref Range Status   SARS Coronavirus 2 by RT PCR NEGATIVE NEGATIVE Final    Comment: (NOTE) SARS-CoV-2 target nucleic acids are NOT DETECTED.  The SARS-CoV-2 RNA is generally detectable in upper respiratory specimens during the acute phase of infection. The lowest concentration of SARS-CoV-2 viral copies this assay can detect is 138 copies/mL. A negative result does not preclude SARS-Cov-2 infection and should not be used as the sole basis for treatment or other patient management decisions. A negative result may occur with  improper specimen collection/handling, submission of specimen other than nasopharyngeal swab, presence of viral mutation(s) within the areas targeted by this assay, and inadequate number of viral copies(<138 copies/mL). A negative result must be combined with clinical observations, patient history, and epidemiological information. The expected result is Negative.  Fact Sheet for Patients:  EntrepreneurPulse.com.au  Fact Sheet for Healthcare Providers:  IncredibleEmployment.be  This test is no t yet approved or cleared by the [place] FDA and  has been authorized for detection and/or diagnosis of SARS-CoV-2 by FDA under an Emergency Use Authorization (EUA). This EUA will remain  in effect (meaning this test can be used) for the duration of the COVID-19 declaration under Section 564(b)(1) of the Act, 21 U.S.C.section 360bbb-3(b)(1), unless the authorization is terminated  or revoked sooner.       Influenza A by PCR NEGATIVE NEGATIVE Final   Influenza B by PCR NEGATIVE NEGATIVE Final    Comment: (NOTE) The Xpert Xpress SARS-CoV-2/FLU/RSV plus assay is intended as an aid in the diagnosis of influenza from Nasopharyngeal swab specimens  and should not be used as a sole basis for treatment. Nasal washings and aspirates are unacceptable for Xpert Xpress SARS-CoV-2/FLU/RSV testing.  Fact Sheet for Patients: EntrepreneurPulse.com.au  Fact Sheet for Healthcare Providers: IncredibleEmployment.be  This test is not yet approved or cleared by the [place] FDA and has been authorized for detection and/or diagnosis of SARS-CoV-2 by FDA under an Emergency Use Authorization (EUA). This EUA will remain in effect (meaning this test can be used) for the duration of the COVID-19 declaration under Section 564(b)(1) of the Act, 21 U.S.C. section 360bbb-3(b)(1), unless the authorization is terminated or revoked.  Performed at [facility], [place] [address] [place], [place] [address]   Blood culture (routine x 2)     Status: None (Preliminary result)   Collection Time: 09/03/21  1:04 PM   Specimen: BLOOD  Result Value Ref Range Status   Specimen Description   Final    BLOOD  BLOOD RIGHT FOREARM Performed at [facility] [address] [place], [place] [address]    Special Requests   Final    BOTTLES DRAWN AEROBIC AND ANAEROBIC Blood Culture adequate volume Performed at [facility] [address], [place], [place] [address]    Culture  Setup Time   Final    YEAST AEROBIC BOTTLE ONLY IN BOTH AEROBIC AND ANAEROBIC BOTTLES CRITICAL RESULT CALLED TO, READ BACK BY AND VERIFIED WITH: PHARMD MICHELLE L. 09/06/21@2 :05 BY TW    Culture   Final    CULTURE REINCUBATED FOR BETTER GROWTH Performed at [facility], [place] [address] [place], [place] [address]    Report Status PENDING  Incomplete  Blood culture (routine x 2)     Status: None (Preliminary result)   Collection Time: 09/03/21  1:04 PM   Specimen: BLOOD  Result Value Ref Range Status   Specimen Description   Final    BLOOD LEFT ANTECUBITAL Performed at [facility] [address], [place], [place] [address]    Special Requests   Final    BOTTLES DRAWN AEROBIC AND ANAEROBIC Blood Culture adequate volume Performed at [facility] [address], [place], [place] [address]    Culture  Setup Time   Final    YEAST AEROBIC BOTTLE ONLY CRITICAL VALUE NOTED.  VALUE IS CONSISTENT WITH PREVIOUSLY REPORTED AND CALLED VALUE.    Culture   Final    TOO YOUNG TO READ Performed at [facility], [place] [address], [place], [place] [address]    Report Status PENDING  Incomplete  Blood Culture ID Panel (Reflexed)     Status: Abnormal   Collection Time: 09/03/21  1:04 PM  Result Value Ref Range Status   Enterococcus faecalis NOT DETECTED NOT DETECTED Final   Enterococcus Faecium NOT DETECTED NOT DETECTED Final   Listeria monocytogenes NOT DETECTED NOT DETECTED Final   Staphylococcus species NOT DETECTED NOT DETECTED Final   Staphylococcus aureus (BCID) NOT DETECTED NOT DETECTED Final   Staphylococcus epidermidis NOT DETECTED NOT DETECTED Final   Staphylococcus lugdunensis NOT DETECTED NOT DETECTED Final   Streptococcus species NOT DETECTED NOT DETECTED Final   Streptococcus agalactiae NOT DETECTED NOT DETECTED Final   Streptococcus pneumoniae NOT DETECTED NOT DETECTED Final   Streptococcus pyogenes NOT DETECTED NOT DETECTED Final   A.calcoaceticus-baumannii NOT DETECTED NOT DETECTED Final   Bacteroides fragilis NOT DETECTED NOT DETECTED Final   Enterobacterales NOT DETECTED NOT DETECTED Final   Enterobacter cloacae complex NOT DETECTED NOT DETECTED Final   Escherichia coli NOT DETECTED NOT DETECTED Final   Klebsiella aerogenes NOT DETECTED NOT DETECTED Final   Klebsiella oxytoca NOT DETECTED NOT DETECTED Final   Klebsiella pneumoniae NOT DETECTED NOT DETECTED Final   Proteus species NOT DETECTED NOT DETECTED Final   Salmonella species NOT DETECTED NOT DETECTED Final   Serratia marcescens NOT DETECTED NOT DETECTED  Final   Haemophilus influenzae NOT DETECTED NOT DETECTED Final   Neisseria meningitidis NOT DETECTED NOT DETECTED Final   Pseudomonas aeruginosa NOT DETECTED NOT DETECTED Final   Stenotrophomonas maltophilia NOT DETECTED NOT DETECTED Final   Candida albicans NOT DETECTED NOT DETECTED Final   Candida auris NOT DETECTED NOT DETECTED Final   Candida glabrata DETECTED (A) NOT DETECTED Final    Comment: CRITICAL RESULT CALLED TO, READ BACK BY AND VERIFIED WITH: PHARMD MICHELLE L. 09/06/21@2 :05 BY TW    Candida krusei NOT DETECTED NOT DETECTED Final  Candida parapsilosis NOT DETECTED NOT DETECTED Final   Candida tropicalis NOT DETECTED NOT DETECTED Final   Cryptococcus neoformans/gattii NOT DETECTED NOT DETECTED Final    Comment: Performed at [facility], [address], [place], [place] [address]  MRSA Next Gen by PCR, Nasal     Status: None   Collection Time: 09/03/21  6:45 PM   Specimen: Nasal Mucosa; Nasal Swab  Result Value Ref Range Status   MRSA by PCR Next Gen NOT DETECTED NOT DETECTED Final    Comment: (NOTE) The GeneXpert MRSA Assay (FDA approved for NASAL specimens only), is one component of a comprehensive MRSA colonization surveillance program. It is not intended to diagnose MRSA infection nor to guide or monitor treatment for MRSA infections. Test performance is not FDA approved in patients less than 26 years old. Performed at [facility], [place] [address], [place], [place] [address]   Urine Culture     Status: None   Collection Time: 09/04/21 10:52 AM   Specimen: Urine, Clean Catch  Result Value Ref Range Status   Specimen Description   Final    URINE, CLEAN CATCH Performed at [facility], [place] [address], [place], [place] [address]    Special Requests   Final    NONE Performed at [facility], [place] [address] [place], [place] [address]    Culture   Final    NO GROWTH Performed at [facility], [place] [address], [place], [place] [address]    Report Status 09/05/2021 FINAL  Final    Thayer Headings, [facility] for Infectious Disease [facility] [email or web address] 09/06/2021, 11:40 AM

## 2021-09-07 DIAGNOSIS — N390 Urinary tract infection, site not specified: Secondary | ICD-10-CM | POA: Diagnosis not present

## 2021-09-07 DIAGNOSIS — Z86711 Personal history of pulmonary embolism: Secondary | ICD-10-CM

## 2021-09-07 DIAGNOSIS — N179 Acute kidney failure, unspecified: Secondary | ICD-10-CM | POA: Diagnosis not present

## 2021-09-07 DIAGNOSIS — A419 Sepsis, unspecified organism: Secondary | ICD-10-CM | POA: Diagnosis not present

## 2021-09-07 DIAGNOSIS — E1100 Type 2 diabetes mellitus with hyperosmolarity without nonketotic hyperglycemic-hyperosmolar coma (NKHHC): Secondary | ICD-10-CM | POA: Diagnosis not present

## 2021-09-07 LAB — GLUCOSE, CAPILLARY
Glucose-Capillary: 135 mg/dL — ABNORMAL HIGH (ref 70–99)
Glucose-Capillary: 109 mg/dL — ABNORMAL HIGH (ref 70–99)
Glucose-Capillary: 119 mg/dL — ABNORMAL HIGH (ref 70–99)
Glucose-Capillary: 100 mg/dL — ABNORMAL HIGH (ref 70–99)

## 2021-09-07 LAB — CBC
HCT: 29.9 % — ABNORMAL LOW (ref 36.0–46.0)
Hemoglobin: 9.8 g/dL — ABNORMAL LOW (ref 12.0–15.0)
MCH: 27.8 pg (ref 26.0–34.0)
MCHC: 32.8 g/dL (ref 30.0–36.0)
MCV: 84.7 fL (ref 80.0–100.0)
Platelets: 249 10*3/uL (ref 150–400)
RBC: 3.53 MIL/uL — ABNORMAL LOW (ref 3.87–5.11)
RDW: 13.7 % (ref 11.5–15.5)
WBC: 6.3 10*3/uL (ref 4.0–10.5)
nRBC: 0 % (ref 0.0–0.2)

## 2021-09-07 LAB — SODIUM, URINE, RANDOM: Sodium, Ur: <10 mmol/L

## 2021-09-07 LAB — PHOSPHORUS: Phosphorus: 4.5 mg/dL (ref 2.5–4.6)

## 2021-09-07 LAB — BASIC METABOLIC PANEL
Anion gap: 7 (ref 5–15)
BUN: 43 mg/dL — ABNORMAL HIGH (ref 6–20)
CO2: 20 mmol/L — ABNORMAL LOW (ref 22–32)
Calcium: 7.6 mg/dL — ABNORMAL LOW (ref 8.9–10.3)
Chloride: 95 mmol/L — ABNORMAL LOW (ref 98–111)
Creatinine, Ser: 2.21 mg/dL — ABNORMAL HIGH (ref 0.44–1.00)
GFR, Estimated: 28 mL/min — ABNORMAL LOW (ref >60)
Glucose, Bld: 132 mg/dL — ABNORMAL HIGH (ref 70–99)
Potassium: 3.6 mmol/L (ref 3.5–5.1)
Sodium: 122 mmol/L — ABNORMAL LOW (ref 135–145)

## 2021-09-07 LAB — MAGNESIUM: Magnesium: 2 mg/dL (ref 1.7–2.4)

## 2021-09-07 LAB — OSMOLALITY, URINE: Osmolality, Ur: 169 mOsm/kg — ABNORMAL LOW (ref 300–900)

## 2021-09-07 MED ORDER — SODIUM CHLORIDE 0.9 % IV SOLN: INTRAVENOUS | Status: DC

## 2021-09-07 NOTE — Assessment & Plan Note (Signed)
Continue Xarelto 

## 2021-09-07 NOTE — Plan of Care (Signed)
°  Problem: Clinical Measurements: Goal: Will remain free from infection Outcome: Not Progressing   Problem: Clinical Measurements: Goal: Diagnostic test results will improve Outcome: Not Progressing   Problem: Elimination: Goal: Will not experience complications related to bowel motility Outcome: Not Progressing   Problem: Pain Managment: Goal: General experience of comfort will improve Outcome: Not Progressing   Problem: Skin Integrity: Goal: Risk for impaired skin integrity will decrease Outcome: Not Progressing   

## 2021-09-07 NOTE — Care Management Important Message (Signed)
Important Message  Patient Details IM Letter given to the Patient. Name: HAILE TOPPINS MRN: [id number] Date of Birth: 1976/02/05   Medicare Important Message Given:  Yes     [name] 09/07/2021, 2:33 PM

## 2021-09-07 NOTE — Progress Notes (Signed)
[facility]   Reason for visit: Follow up on Candidemia  Interval History: no rash, no diarrhea.  No vision changes.  WBC 6.3 and remains afebrile > 48 hours.   Day 3 anidulafungin Day 5 total antibiotics  Physical Exam: Constitutional:  Vitals:   09/07/21 0444 09/07/21 1010  BP: 106/63 124/72  Pulse: 75 81  Resp: 16 16  Temp: 98.1 F (36.7 C) 98 F (36.7 C)  SpO2: 97% 98%   patient appears in NAD Respiratory: Normal respiratory effort; CTA B Cardiovascular: RRR GI: soft, nt, nd  Review of Systems: Constitutional: negative for fevers and chills Gastrointestinal: negative for nausea and diarrhea  Lab Results  Component Value Date   WBC 6.3 09/07/2021   HGB 9.8 (L) 09/07/2021   HCT 29.9 (L) 09/07/2021   MCV 84.7 09/07/2021   PLT 249 09/07/2021    Lab Results  Component Value Date   CREATININE 2.21 (H) 09/07/2021   BUN 43 (H) 09/07/2021   NA 122 (L) 09/07/2021   K 3.6 09/07/2021   CL 95 (L) 09/07/2021   CO2 20 (L) 09/07/2021    Lab Results  Component Value Date   ALT 14 09/06/2021   AST 17 09/06/2021   ALKPHOS 64 09/06/2021     Microbiology: Recent Results (from the past 240 hour(s))  Resp Panel by RT-PCR (Flu A&B, Covid) Nasopharyngeal Swab     Status: None   Collection Time: 09/03/21  5:57 AM   Specimen: Nasopharyngeal Swab; Nasopharyngeal(NP) swabs in vial transport medium  Result Value Ref Range Status   SARS Coronavirus 2 by RT PCR NEGATIVE NEGATIVE Final    Comment: (NOTE) SARS-CoV-2 target nucleic acids are NOT DETECTED.  The SARS-CoV-2 RNA is generally detectable in upper respiratory specimens during the acute phase of infection. The lowest concentration of SARS-CoV-2 viral copies this assay can detect is 138 copies/mL. A negative result does not preclude SARS-Cov-2 infection and should not be used as the sole basis for treatment or other patient management decisions. A negative result may occur with  improper  specimen collection/handling, submission of specimen other than nasopharyngeal swab, presence of viral mutation(s) within the areas targeted by this assay, and inadequate number of viral copies(<138 copies/mL). A negative result must be combined with clinical observations, patient history, and epidemiological information. The expected result is Negative.  Fact Sheet for Patients:  BloggerCourse.com  Fact Sheet for Healthcare Providers:  SeriousBroker.it  This test is no t yet approved or cleared by the [place] FDA and  has been authorized for detection and/or diagnosis of SARS-CoV-2 by FDA under an Emergency Use Authorization (EUA). This EUA will remain  in effect (meaning this test can be used) for the duration of the COVID-19 declaration under Section 564(b)(1) of the Act, 21 U.S.C.section 360bbb-3(b)(1), unless the authorization is terminated  or revoked sooner.       Influenza A by PCR NEGATIVE NEGATIVE Final   Influenza B by PCR NEGATIVE NEGATIVE Final    Comment: (NOTE) The Xpert Xpress SARS-CoV-2/FLU/RSV plus assay is intended as an aid in the diagnosis of influenza from Nasopharyngeal swab specimens and should not be used as a sole basis for treatment. Nasal washings and aspirates are unacceptable for Xpert Xpress SARS-CoV-2/FLU/RSV testing.  Fact Sheet for Patients: BloggerCourse.com  Fact Sheet for Healthcare Providers: SeriousBroker.it  This test is not yet approved or cleared by the [place] FDA and has been authorized for detection and/or diagnosis of SARS-CoV-2 by FDA  under an Emergency Use Authorization (EUA). This EUA will remain in effect (meaning this test can be used) for the duration of the COVID-19 declaration under Section 564(b)(1) of the Act, 21 U.S.C. section 360bbb-3(b)(1), unless the authorization is terminated or revoked.  Performed at  [facility], [address], [place], [place] [address]   Blood culture (routine x 2)     Status: Abnormal (Preliminary result)   Collection Time: 09/03/21  1:04 PM   Specimen: BLOOD  Result Value Ref Range Status   Specimen Description   Final    BLOOD BLOOD RIGHT FOREARM Performed at [facility], [address] [place], [place] [address]    Special Requests   Final    BOTTLES DRAWN AEROBIC AND ANAEROBIC Blood Culture adequate volume Performed at [facility], [address], [place], [place] [address]    Culture  Setup Time   Final    YEAST IN BOTH AEROBIC AND ANAEROBIC BOTTLES CRITICAL RESULT CALLED TO, READ BACK BY AND VERIFIED WITH: PHARMD MICHELLE L. 09/06/21@2 :05 BY TW    Culture (A)  Final    CANDIDA GLABRATA Sent to [facility] for further susceptibility testing. Performed at [facility], [address] [place], [place] [address]    Report Status PENDING  Incomplete  Blood culture (routine x 2)     Status: Abnormal (Preliminary result)   Collection Time: 09/03/21  1:04 PM   Specimen: BLOOD  Result Value Ref Range Status   Specimen Description   Final    BLOOD LEFT ANTECUBITAL Performed at [facility], [address] [place], [place] [address]    Special Requests   Final    BOTTLES DRAWN AEROBIC AND ANAEROBIC Blood Culture adequate volume Performed at [facility], [address], [place], [place] [address]    Culture  Setup Time   Final    YEAST IN BOTH AEROBIC AND ANAEROBIC BOTTLES CRITICAL VALUE NOTED.  VALUE IS CONSISTENT WITH PREVIOUSLY REPORTED AND CALLED VALUE. Performed at [facility], [address] [place], [place] [address]    Culture CANDIDA GLABRATA (A)  Final   Report Status PENDING  Incomplete  Blood Culture ID Panel (Reflexed)     Status: Abnormal   Collection Time: 09/03/21  1:04 PM  Result Value Ref Range Status   Enterococcus  faecalis NOT DETECTED NOT DETECTED Final   Enterococcus Faecium NOT DETECTED NOT DETECTED Final   Listeria monocytogenes NOT DETECTED NOT DETECTED Final   Staphylococcus species NOT DETECTED NOT DETECTED Final   Staphylococcus aureus (BCID) NOT DETECTED NOT DETECTED Final   Staphylococcus epidermidis NOT DETECTED NOT DETECTED Final   Staphylococcus lugdunensis NOT DETECTED NOT DETECTED Final   Streptococcus species NOT DETECTED NOT DETECTED Final   Streptococcus agalactiae NOT DETECTED NOT DETECTED Final   Streptococcus pneumoniae NOT DETECTED NOT DETECTED Final   Streptococcus pyogenes NOT DETECTED NOT DETECTED Final   A.calcoaceticus-baumannii NOT DETECTED NOT DETECTED Final   Bacteroides fragilis NOT DETECTED NOT DETECTED Final   Enterobacterales NOT DETECTED NOT DETECTED Final   Enterobacter cloacae complex NOT DETECTED NOT DETECTED Final   Escherichia coli NOT DETECTED NOT DETECTED Final   Klebsiella aerogenes NOT DETECTED NOT DETECTED Final   Klebsiella oxytoca NOT DETECTED NOT DETECTED Final   Klebsiella pneumoniae NOT DETECTED NOT DETECTED Final   Proteus species NOT DETECTED NOT DETECTED Final   Salmonella species NOT DETECTED NOT DETECTED Final   Serratia marcescens NOT  DETECTED NOT DETECTED Final   Haemophilus influenzae NOT DETECTED NOT DETECTED Final   Neisseria meningitidis NOT DETECTED NOT DETECTED Final   Pseudomonas aeruginosa NOT DETECTED NOT DETECTED Final   Stenotrophomonas maltophilia NOT DETECTED NOT DETECTED Final   Candida albicans NOT DETECTED NOT DETECTED Final   Candida auris NOT DETECTED NOT DETECTED Final   Candida glabrata DETECTED (A) NOT DETECTED Final    Comment: CRITICAL RESULT CALLED TO, READ BACK BY AND VERIFIED WITH: PHARMD MICHELLE L. 09/06/21@2 :05 BY TW    Candida krusei NOT DETECTED NOT DETECTED Final   Candida parapsilosis NOT DETECTED NOT DETECTED Final   Candida tropicalis NOT DETECTED NOT DETECTED Final   Cryptococcus neoformans/gattii NOT  DETECTED NOT DETECTED Final    Comment: Performed at [facility], [address] [place], [place] [address]  MRSA Next Gen by PCR, Nasal     Status: None   Collection Time: 09/03/21  6:45 PM   Specimen: Nasal Mucosa; Nasal Swab  Result Value Ref Range Status   MRSA by PCR Next Gen NOT DETECTED NOT DETECTED Final    Comment: (NOTE) The GeneXpert MRSA Assay (FDA approved for NASAL specimens only), is one component of a comprehensive MRSA colonization surveillance program. It is not intended to diagnose MRSA infection nor to guide or monitor treatment for MRSA infections. Test performance is not FDA approved in patients less than 79 years old. Performed at [facility], [address] [place], [place] [address]   Urine Culture     Status: None   Collection Time: 09/04/21 10:52 AM   Specimen: Urine, Clean Catch  Result Value Ref Range Status   Specimen Description   Final    URINE, CLEAN CATCH Performed at [facility], [address], [place], [place] [address]    Special Requests   Final    NONE Performed at [facility], [address], [place], [place] [address]    Culture   Final    NO GROWTH Performed at [facility], [address] [place], [place] [address]    Report Status 09/05/2021 FINAL  Final    Impression/Plan:  1. Candidemia - C glabrata in 4/4 blood culture bottles and on anidulafungin.  She is improving clinically.  Though her urine culture did not grow anything, her UA noted yeast and significant WBCs along with her presentation of hydronephrosis and ureteral stone is most c/w a urinary source.   Will continue with anidulafungin. With a significant chance of azole resistance with C glabrata, will continue with anidulafungin and plan for 3 weeks from today if repeat cultures remain negative.  May transition to fluconazole later if sensitive, likely after discharge.   2.  Renal abscess -  consider IR evaluation for drainage of the abscess.  On antibiotics as above.    3.  Acute kidney injury - improving and from the obstruction, now with double-J stent.    4.  Access - will need a picc line for treatment due to a resistant organism.  Blood cultures sent today and if they remain negative, can consider a picc line on Saturday.  If there is concern for long term renal issues, can consider a tunneled catheter.

## 2021-09-07 NOTE — Progress Notes (Signed)
[facility]  PROGRESS NOTE  Karen Bennett [id number] DOB: 11-28-1975 DOA: 09/03/2021 PCP: Pablo Lawrence, NP  Brief hospital course Karen Bennett is a 46 y.o. female with medical history significant of anxiety, depression, cellulitis of the lower extremities, type 2 diabetes, infective endocarditis with MSSA, history of DVT, history of PE not taking anticoagulation, hypertension, mixed hypertriglyceridemia, class III obesity with a BMI of 68.11 kg/m, tobacco use, right BKA, active smoker, noncompliance with medical treatment  who last took her medications at least a month ago presented to hospital with nausea, vomiting, diarrhea, right-sided abdominal pain with polyuria fever and chills.  Patient had low-grade fever and leukocytosis on presentation.  Lipase was normal.  Sodium was low at 123 and glucose was elevated at 603.  LFTs showed low albumin.  Chest x-ray without any acute findings.  CT scan of the abdomen and pelvis showed chronically obstructing 1.3 x 1.0 cm nephrolith at the right UP junction.  During hospitalization, patient underwent right stent placement by urology.  Blood cultures were negative but patient continued to have fever.  Renal function has trended up from initial improvement.  Blood cultures showing Candida.  ID has been consulted including urology at this time.     Subjective   Patient seen and examined, denies abdominal pain.     Assessment and Plan:  * Sepsis secondary to UTI [facility])- (present on admission) -Status post ureteric stone removal and stent placement.    -Blood cultures showed candida glabrata. Urine culture showed no growth.  - Patient started on Eraxis, cefepime discontinued by ID.   -Repeat blood cultures obtained today, if negative, ID recommends putting PICC line on Saturday  -Dr. Louanne Belton spoke with Dr. Diona Fanti urology as well who recommended continuation of treatment    Pressure injury of back, stage 2 [facility])- (present on  admission) Pressure Injury 09/03/21 Sacrum Medial Stage 2 -  Partial thickness loss of dermis presenting as a shallow open injury with a red, pink wound bed without slough. (Active)  09/03/21 1845  Location: Sacrum  Location Orientation: Medial  Staging: Stage 2 -  Partial thickness loss of dermis presenting as a shallow open injury with a red, pink wound bed without slough.  Wound Description (Comments):   Present on Admission: Yes     Pressure Injury 09/03/21 Buttocks Right Stage 2 -  Partial thickness loss of dermis presenting as a shallow open injury with a red, pink wound bed without slough. (Active)  09/03/21 1845  Location: Buttocks  Location Orientation: Right  Staging: Stage 2 -  Partial thickness loss of dermis presenting as a shallow open injury with a red, pink wound bed without slough.  Wound Description (Comments):   Present on Admission: Yes   Continue wound care    Obstruction of right ureteropelvic junction (UPJ) due to stone- (present on admission) -Status post cystoscopy with right retrograde ureteropyelogram and insertion of double-J stent on 09/03/2021 by Dr. Diona Fanti urology.  - Continue antifungals for now  Hx of right BKA ([place]) -Supportive care.   -Patient does have home health aide and wheelchair at baseline.  Tobacco abuse- (present on admission) Declined nicotine patch.  Noncompliance with treatment plan Patient states that she was low in mood and was not taking her medications recently.  Emphasized compliance with medication including insulin.  Diabetic hyperosmolar non-ketotic state ([place])- (present on admission) -Resolved  at this time.   -Continue sliding scale insulin, long-acting insulin Accu-Cheks, diabetic diet.  History of pulmonary embolism- (present on  admission) - Continue Xarelto  HTN (hypertension)- (present on admission) -Continue metoprolol.   -Lisinopril on  hold at this time.   BMI 60.0-69.9, adult ([place]) -Morbid obesity.    -Will benefit from weight loss as outpatient.  AKI (acute kidney injury) ([place])  -Likely contrast induced nephropathy, patient was given IV contrast on 09/03/2021 for CT abdomen/pelvis -Creatinine jumped from 1.04 on 09/03/2021 to 2.31 on 09/06/2021 -Today creatinine is down to 2.21 -Renal ultrasound shows medical renal disease -Continue Foley catheter -Continue normal saline at 100 mm/h -Follow renal function in a.m., if creatinine still evaluated; consider nephrology consultation  HLD (hyperlipidemia)- (present on admission) -Continue Lipitor 40 milligrams daily.  Diabetic peripheral neuropathy associated with type 2 diabetes mellitus ([place])- (present on admission) -Continue supportive care  Hyponatremia- (present on admission) -Sodium is 122 -She presented with sodium of 123 -Glucose has normalized -Likely increased free water intake and osmotic diuresis from hyperglycemia -We will put on fluid restriction 1.5 L/day -Follow serum sodium in a.m. -Follow urine osmolality, TSH            Medications     atorvastatin  40 mg Oral q1800   buPROPion ER  100 mg Oral q morning   Chlorhexidine Gluconate Cloth  6 each Topical Daily   insulin aspart  0-20 Units Subcutaneous TID WC   insulin aspart  6 Units Subcutaneous TID WC   insulin glargine-yfgn  40 Units Subcutaneous Daily   mouth rinse  15 mL Mouth Rinse BID   rivaroxaban  20 mg Oral QPM   sodium chloride flush  3 mL Intravenous Q12H   traZODone  150 mg Oral QHS     Data Reviewed:   CBG:  Recent Labs  Lab 09/06/21 1359 09/06/21 1655 09/06/21 2325 09/07/21 0755 09/07/21 1154  GLUCAP 107* 175* 113* 135* 109*    SpO2: 96 % O2 Flow Rate (L/min): 4 L/min    Vitals:   09/06/21 2330 09/07/21 0444 09/07/21 1010 09/07/21 1300  BP: 122/60 106/63 124/72 127/60  Pulse: 84 75 81 80  Resp: 18 16 16 16   Temp: 98.7 F (37.1 C) 98.1 F (36.7 C) 98 F (36.7 C) 98 F (36.7 C)  TempSrc: Oral Oral Oral Oral   SpO2: 96% 97% 98% 96%  Weight:      Height:          Data Reviewed:  Basic Metabolic Panel: Recent Labs  Lab 09/03/21 2256 09/04/21 0259 09/05/21 0255 09/06/21 0426 09/07/21 0715  NA 127* 126* 123* 121* 122*  K 3.7 3.7 3.8 3.9 3.6  CL 97* 97* 95* 94* 95*  CO2 21* 21* 20* 18* 20*  GLUCOSE 143* 131* 230* 146* 132*  BUN 20 20 34* 41* 43*  CREATININE 1.30* 1.45* 2.04* 2.31* 2.21*  CALCIUM 8.2* 8.1* 7.6* 7.6* 7.6*  MG  --   --  2.0 2.0 2.0  PHOS  --   --  4.2 4.7* 4.5    CBC: Recent Labs  Lab 09/03/21 0540 09/04/21 0259 09/05/21 0522 09/06/21 0426 09/07/21 0715  WBC 13.0* 11.6* 8.1 6.7 6.3  NEUTROABS 12.1* 10.3* 6.0  --   --   HGB 12.9 14.5 10.7* 10.0* 9.8*  HCT 38.7 46.2* 31.6* 30.5* 29.9*  MCV 85.2 90.2 84.5 86.2 84.7  PLT 322 218 248 220 249    LFT Recent Labs  Lab 09/03/21 0540 09/05/21 0255 09/06/21 0426  AST 15 24 17   ALT 10 13 14   ALKPHOS 91 68 64  BILITOT 0.9 0.2*  0.2*  PROT 7.5 5.8* 6.1*  ALBUMIN 2.1* 1.5* 1.6*     Micro blood culture from 09/03/2021 showed Candida glabrata, sensitivities are pending Urine culture 09/04/2021 showed no growth Repeat blood cultures obtained 09/07/2021    Antibiotics: Anti-infectives (From admission, onward)    Start     Dose/Rate Route Frequency Ordered Stop   09/07/21 0400  anidulafungin (ERAXIS) 100 mg in sodium chloride 0.9 % 100 mL IVPB       See Hyperspace for full Linked Orders Report.   100 mg 78 mL/hr over 100 Minutes Intravenous Every 24 hours 09/06/21 0248     09/06/21 0400  anidulafungin (ERAXIS) 200 mg in sodium chloride 0.9 % 200 mL IVPB       See Hyperspace for full Linked Orders Report.   200 mg 78 mL/hr over 200 Minutes Intravenous  Once 09/06/21 0248 09/06/21 0722   09/03/21 2000  ceFEPIme (MAXIPIME) 2 g in sodium chloride 0.9 % 100 mL IVPB  Status:  Discontinued        2 g 200 mL/hr over 30 Minutes Intravenous Every 8 hours 09/03/21 1557 09/06/21 1037   09/03/21 1145  ceFEPIme  (MAXIPIME) 2 g in sodium chloride 0.9 % 100 mL IVPB        2 g 200 mL/hr over 30 Minutes Intravenous  Once 09/03/21 1133 09/03/21 1208   09/03/21 1145  metroNIDAZOLE (FLAGYL) IVPB 500 mg        500 mg 100 mL/hr over 60 Minutes Intravenous  Once 09/03/21 1133 09/03/21 1348          DVT prophylaxis: Rivaroxaban  Code Status: Full code  Family Communication: No family at bedside  CONSULTS: Infectious disease, urology    Objective    Physical Examination:   General-appears in no acute distress Heart-S1-S2, regular, no murmur auscultated Lungs-clear to auscultation bilaterally, no wheezing or crackles auscultated Abdomen-soft, nontender, no organomegaly Extremities-no edema in the lower extremities Neuro-alert, oriented x3, no focal deficit noted  Status is: Inpatient ; Candida bacteremia         [place]ra   [facility] If 7PM-7AM, please contact night-coverage at [email or web address], Office  [phone]   09/07/2021, 4:32 PM  LOS: 4 days

## 2021-09-07 NOTE — TOC Initial Note (Signed)
Transition of Care [facility]) - Initial/Assessment Note   Patient Details  Name: Karen Bennett MRN: [id number] Date of Birth: 1976/01/14  Transition of Care [facility]) CM/SW Contact:    Sherie Don, LCSW Phone Number: 09/07/2021, 3:12 PM  Clinical Narrative: [facility] consulted as patient will need to discharge home on IV antibiotics. CSW spoke with patient about referral for antibiotics. CSW made referral to [facility] with [facility]. TOC to follow.  Expected Discharge Plan: [place] Barriers to Discharge: Continued Medical Work up  Patient Goals and CMS Choice Patient states their goals for this hospitalization and ongoing recovery are:: Return home CMS Medicare.gov Compare Post Acute Care list provided to:: Patient Choice offered to / list presented to : Patient  Expected Discharge Plan and Services Expected Discharge Plan: [place] In-house Referral: Clinical Social Work Post Acute Care Choice: [place] arrangements for the past 2 months: Apartment             DME Arranged: N/A DME Agency: NA HH Arranged: RN, IV Antibiotics HH Agency: Tour manager Date [place]: 09/07/21 Time [place]: 1433 Representative spoke with at [place]: [place]  Prior Living Arrangements/Services Living arrangements for the past 2 months: Apartment Lives with:: Self Patient language and need for interpreter reviewed:: Yes Do you feel safe going back to the place where you live?: Yes      Need for Family Participation in Patient Care: No (Comment) Care giver support system in place?: Yes (comment) Current home services: DME (Power wheelchair, shower chair) Criminal Activity/Legal Involvement Pertinent to Current Situation/Hospitalization: No - Comment as needed  Activities of Daily Living Home Assistive Devices/Equipment: Shower chair with back, Wheelchair ADL Screening (condition at time of admission) Patient's cognitive ability adequate to safely  complete daily activities?: Yes Is the patient deaf or have difficulty hearing?: No Does the patient have difficulty seeing, even when wearing glasses/contacts?: No Does the patient have difficulty concentrating, remembering, or making decisions?: Yes Patient able to express need for assistance with ADLs?: Yes Does the patient have difficulty dressing or bathing?: Yes Independently performs ADLs?: No Communication: Independent Dressing (OT): Independent Grooming: Independent Feeding: Independent Bathing: Independent with device (comment) (shower chair) Toileting: Needs assistance (when ill) Is this a change from baseline?: Pre-admission baseline In/Out Bed: Independent Walks in Home: Independent with device (comment) (motorized wheelchair) Does the patient have difficulty walking or climbing stairs?: Yes Weakness of Legs: Left (right BKA) Weakness of Arms/Hands: Both  Permission Sought/Granted Permission sought to share information with : Other (comment) Permission granted to share information with : Yes, Verbal Permission Granted Permission granted to share info w AGENCY: [employer], [facility] agencies  Emotional Assessment Attitude/Demeanor/Rapport: Engaged Affect (typically observed): Accepting Orientation: : Oriented to Self, Oriented to Place, Oriented to  Time, Oriented to Situation Alcohol / Substance Use: Not Applicable  Admission diagnosis:  Hyponatremia [E87.1] Hyperglycemia [R73.9] Noncompliance with medication regimen [Z91.14] Renal abscess, right [N15.1] Sepsis secondary to UTI ([place]) [A41.9, N39.0] Abdominal pain, unspecified abdominal location [R10.9] Obstruction of right ureteropelvic junction (UPJ) due to stone [N20.1] Nausea and vomiting, unspecified vomiting type [R11.2] Patient Active Problem List   Diagnosis Date Noted   Pressure injury of back, stage 2 ([place]) 09/05/2021   Obstruction of right ureteropelvic junction (UPJ) due to stone 09/04/2021   Sepsis secondary  to UTI ([place]) 09/03/2021   Diabetic hyperosmolar non-ketotic state ([place]) 09/03/2021   Stenosis of subclavian artery ([place]) 02/09/2021   Sepsis ([place]) 02/01/2021  Mixed hyperlipidemia 01/12/2021   Infective endocarditis    Pressure injury of skin 12/28/2020   BMI 60.0-69.9, adult ([place]) 12/27/2020   HTN (hypertension) 12/27/2020   History of pulmonary embolism 12/27/2020   Necrotizing fasciitis of lower leg ([place]) Thigh  12/27/2020   Cellulitis 12/26/2020   History of DVT (deep vein thrombosis) 11/12/2020   Tobacco abuse 12/31/2017   Hx of right BKA ([place]) 12/31/2017   Closed fracture of right distal femur ([place]) 12/31/2017   Hirsutism 06/21/2016   Noncompliance with treatment plan 04/19/2015   Anxiety and depression 06/26/2013   Normocytic anemia 05/22/2013   Depression 05/22/2013   Grief reaction 05/22/2013   AKI (acute kidney injury) ([place]) 05/07/2013   Phantom limb pain ([place]) 05/06/2013   HLD (hyperlipidemia) 05/02/2013   Diabetic peripheral neuropathy associated with type 2 diabetes mellitus ([place]) 04/30/2013    Class: Chronic   MSSA (methicillin susceptible Staphylococcus aureus) 09/06/2012   Foot osteomyelitis, left ([place]) 09/03/2012   Pulmonary emboli ([place]) 11/11/2011   Hyponatremia 09/05/2011   Diabetic foot ulcer ([place]) 09/05/2011   Obesity, Class III, BMI 40-49.9 (morbid obesity) ([place]) 08/15/2011   DM (diabetes mellitus), type 2, uncontrolled with complications ([place]) [id number]   PCP:  Pablo Lawrence, NP Pharmacy:   [id number] [place] [id number] [place] [id number] Phone: [phone] Fax: [phone]  Readmission Risk Interventions Readmission Risk Prevention Plan 01/05/2021  Transportation Screening Complete  PCP or Specialist Appt within 5-7 Days Complete  Home Care Screening Complete  Some recent data might be hidden

## 2021-09-08 LAB — COMPREHENSIVE METABOLIC PANEL
ALT: 11 U/L (ref 0–44)
AST: 11 U/L — ABNORMAL LOW (ref 15–41)
Albumin: <1.5 g/dL — ABNORMAL LOW (ref 3.5–5.0)
Alkaline Phosphatase: 70 U/L (ref 38–126)
Anion gap: 7 (ref 5–15)
BUN: 40 mg/dL — ABNORMAL HIGH (ref 6–20)
CO2: 19 mmol/L — ABNORMAL LOW (ref 22–32)
Calcium: 7.8 mg/dL — ABNORMAL LOW (ref 8.9–10.3)
Chloride: 101 mmol/L (ref 98–111)
Creatinine, Ser: 1.85 mg/dL — ABNORMAL HIGH (ref 0.44–1.00)
GFR, Estimated: 34 mL/min — ABNORMAL LOW (ref >60)
Glucose, Bld: 118 mg/dL — ABNORMAL HIGH (ref 70–99)
Potassium: 3.9 mmol/L (ref 3.5–5.1)
Sodium: 127 mmol/L — ABNORMAL LOW (ref 135–145)
Total Bilirubin: 0.3 mg/dL (ref 0.3–1.2)
Total Protein: 5.9 g/dL — ABNORMAL LOW (ref 6.5–8.1)

## 2021-09-08 LAB — GLUCOSE, CAPILLARY
Glucose-Capillary: 116 mg/dL — ABNORMAL HIGH (ref 70–99)
Glucose-Capillary: 93 mg/dL (ref 70–99)
Glucose-Capillary: 117 mg/dL — ABNORMAL HIGH (ref 70–99)
Glucose-Capillary: 126 mg/dL — ABNORMAL HIGH (ref 70–99)

## 2021-09-08 LAB — CBC
HCT: 27.9 % — ABNORMAL LOW (ref 36.0–46.0)
Hemoglobin: 9 g/dL — ABNORMAL LOW (ref 12.0–15.0)
MCH: 28 pg (ref 26.0–34.0)
MCHC: 32.3 g/dL (ref 30.0–36.0)
MCV: 86.6 fL (ref 80.0–100.0)
Platelets: 268 10*3/uL (ref 150–400)
RBC: 3.22 MIL/uL — ABNORMAL LOW (ref 3.87–5.11)
RDW: 13.8 % (ref 11.5–15.5)
WBC: 7.6 10*3/uL (ref 4.0–10.5)
nRBC: 0 % (ref 0.0–0.2)

## 2021-09-08 NOTE — Progress Notes (Signed)
[facility]  PROGRESS NOTE  Karen Bennett [id number] DOB: 1976/07/04 DOA: 09/03/2021 PCP: Pablo Lawrence, NP  Brief hospital course Karen Bennett is a 46 y.o. female with medical history significant of anxiety, depression, cellulitis of the lower extremities, type 2 diabetes, infective endocarditis with MSSA, history of DVT, history of PE not taking anticoagulation, hypertension, mixed hypertriglyceridemia, class III obesity with a BMI of 68.11 kg/m, tobacco use, right BKA, active smoker, noncompliance with medical treatment  who last took her medications at least a month ago presented to hospital with nausea, vomiting, diarrhea, right-sided abdominal pain with polyuria fever and chills.  Patient had low-grade fever and leukocytosis on presentation.  Lipase was normal.  Sodium was low at 123 and glucose was elevated at 603.  LFTs showed low albumin.  Chest x-ray without any acute findings.  CT scan of the abdomen and pelvis showed chronically obstructing 1.3 x 1.0 cm nephrolith at the right UP junction.  During hospitalization, patient underwent right stent placement by urology.  Blood cultures were negative but patient continued to have fever.  Renal function has trended up from initial improvement.  Blood cultures showing Candida.  ID has been consulted including urology at this time.     Subjective   Patient seen and examined, denies shortness of breath.     Assessment and Plan:  * Sepsis secondary to UTI [facility])- (present on admission) -Status post ureteric stone removal and stent placement.    -Blood cultures showed candida glabrata. Urine culture showed no growth.  - Patient started on Eraxis, cefepime discontinued by ID.   -Repeat blood cultures obtained, if negative, ID recommends putting PICC line on Saturday  -Dr. Louanne Belton spoke with Dr. Diona Fanti urology as well who recommended continuation of treatment    Pressure injury of back, stage 2 [facility])- (present on  admission) Pressure Injury 09/03/21 Sacrum Medial Stage 2 -  Partial thickness loss of dermis presenting as a shallow open injury with a red, pink wound bed without slough. (Active)  09/03/21 1845  Location: Sacrum  Location Orientation: Medial  Staging: Stage 2 -  Partial thickness loss of dermis presenting as a shallow open injury with a red, pink wound bed without slough.  Wound Description (Comments):   Present on Admission: Yes     Pressure Injury 09/03/21 Buttocks Right Stage 2 -  Partial thickness loss of dermis presenting as a shallow open injury with a red, pink wound bed without slough. (Active)  09/03/21 1845  Location: Buttocks  Location Orientation: Right  Staging: Stage 2 -  Partial thickness loss of dermis presenting as a shallow open injury with a red, pink wound bed without slough.  Wound Description (Comments):   Present on Admission: Yes   Continue wound care    Obstruction of right ureteropelvic junction (UPJ) due to stone- (present on admission) -Status post cystoscopy with right retrograde ureteropyelogram and insertion of double-J stent on 09/03/2021 by Dr. Diona Fanti urology.  - Continue antifungals for now  Hx of right BKA ([place]) -Supportive care.   -Patient does have home health aide and wheelchair at baseline.  Tobacco abuse- (present on admission) Declined nicotine patch.  Noncompliance with treatment plan Patient states that she was low in mood and was not taking her medications recently.  Emphasized compliance with medication including insulin.  Diabetic hyperosmolar non-ketotic state ([place])- (present on admission) -Resolved  at this time.   -Continue sliding scale insulin, long-acting insulin Accu-Cheks, diabetic diet.  History of pulmonary embolism- (present on  admission) - Continue Xarelto  HTN (hypertension)- (present on admission) -Continue metoprolol.   -Lisinopril on  hold at this time.   BMI 60.0-69.9, adult ([place]) -Morbid obesity.    -Will benefit from weight loss as outpatient.  AKI (acute kidney injury) ([place])  -Likely contrast induced nephropathy, patient was given IV contrast on 09/03/2021 for CT abdomen/pelvis -Creatinine jumped from 1.04 on 09/03/2021 to 2.31 on 09/06/2021 -Today creatinine is down to 1.85 after she was started on normal saline at 100 mL/h -Renal ultrasound shows medical renal disease -Continue Foley catheter   HLD (hyperlipidemia)- (present on admission) -Continue Lipitor 40 milligrams daily.  Diabetic peripheral neuropathy associated with type 2 diabetes mellitus ([place])- (present on admission) -Continue supportive care  Hyponatremia- (present on admission) -Sodium was 122 yesterday, improved to 127 today after she was put on fluid restriction of 1.5 L/day -She presented with sodium of 123 -Glucose has normalized -Likely increased free water intake and osmotic diuresis from hyperglycemi -urine osmolality is 169, TSH 3.731            Medications     atorvastatin  40 mg Oral q1800   buPROPion ER  100 mg Oral q morning   Chlorhexidine Gluconate Cloth  6 each Topical Daily   insulin aspart  0-20 Units Subcutaneous TID WC   insulin aspart  6 Units Subcutaneous TID WC   insulin glargine-yfgn  40 Units Subcutaneous Daily   mouth rinse  15 mL Mouth Rinse BID   rivaroxaban  20 mg Oral QPM   sodium chloride flush  3 mL Intravenous Q12H   traZODone  150 mg Oral QHS     Data Reviewed:   CBG:  Recent Labs  Lab 09/07/21 1154 09/07/21 1632 09/07/21 2126 09/08/21 0822 09/08/21 1235  GLUCAP 109* 119* 100* 116* 93    SpO2: 97 % O2 Flow Rate (L/min): 4 L/min    Vitals:   09/07/21 1300 09/07/21 2212 09/08/21 0656 09/08/21 1329  BP: 127/60 123/61 (!) 113/50 (!) 117/55  Pulse: 80 77 75 74  Resp: 16 18 18 18   Temp: 98 F (36.7 C) 98.9 F (37.2 C) 98.5 F (36.9 C) 98.5 F (36.9 C)  TempSrc: Oral Oral Oral Oral  SpO2: 96% 96% 94% 97%  Weight:      Height:           Data Reviewed:  Basic Metabolic Panel: Recent Labs  Lab 09/04/21 0259 09/05/21 0255 09/06/21 0426 09/07/21 0715 09/08/21 0405  NA 126* 123* 121* 122* 127*  K 3.7 3.8 3.9 3.6 3.9  CL 97* 95* 94* 95* 101  CO2 21* 20* 18* 20* 19*  GLUCOSE 131* 230* 146* 132* 118*  BUN 20 34* 41* 43* 40*  CREATININE 1.45* 2.04* 2.31* 2.21* 1.85*  CALCIUM 8.1* 7.6* 7.6* 7.6* 7.8*  MG  --  2.0 2.0 2.0  --   PHOS  --  4.2 4.7* 4.5  --     CBC: Recent Labs  Lab 09/03/21 0540 09/04/21 0259 09/05/21 0522 09/06/21 0426 09/07/21 0715 09/08/21 0405  WBC 13.0* 11.6* 8.1 6.7 6.3 7.6  NEUTROABS 12.1* 10.3* 6.0  --   --   --   HGB 12.9 14.5 10.7* 10.0* 9.8* 9.0*  HCT 38.7 46.2* 31.6* 30.5* 29.9* 27.9*  MCV 85.2 90.2 84.5 86.2 84.7 86.6  PLT 322 218 248 220 249 268    LFT Recent Labs  Lab 09/03/21 0540 09/05/21 0255 09/06/21 0426 09/08/21 0405  AST 15 24 17  11*  ALT  10 13 14 11   ALKPHOS 91 68 64 70  BILITOT 0.9 0.2* 0.2* 0.3  PROT 7.5 5.8* 6.1* 5.9*  ALBUMIN 2.1* 1.5* 1.6* <1.5*     Micro blood culture from 09/03/2021 showed Candida glabrata, sensitivities are pending Urine culture 09/04/2021 showed no growth Repeat blood cultures obtained 09/07/2021    Antibiotics: Anti-infectives (From admission, onward)    Start     Dose/Rate Route Frequency Ordered Stop   09/07/21 0400  anidulafungin (ERAXIS) 100 mg in sodium chloride 0.9 % 100 mL IVPB       See Hyperspace for full Linked Orders Report.   100 mg 78 mL/hr over 100 Minutes Intravenous Every 24 hours 09/06/21 0248     09/06/21 0400  anidulafungin (ERAXIS) 200 mg in sodium chloride 0.9 % 200 mL IVPB       See Hyperspace for full Linked Orders Report.   200 mg 78 mL/hr over 200 Minutes Intravenous  Once 09/06/21 0248 09/06/21 0722   09/03/21 2000  ceFEPIme (MAXIPIME) 2 g in sodium chloride 0.9 % 100 mL IVPB  Status:  Discontinued        2 g 200 mL/hr over 30 Minutes Intravenous Every 8 hours 09/03/21 1557 09/06/21 1037    09/03/21 1145  ceFEPIme (MAXIPIME) 2 g in sodium chloride 0.9 % 100 mL IVPB        2 g 200 mL/hr over 30 Minutes Intravenous  Once 09/03/21 1133 09/03/21 1208   09/03/21 1145  metroNIDAZOLE (FLAGYL) IVPB 500 mg        500 mg 100 mL/hr over 60 Minutes Intravenous  Once 09/03/21 1133 09/03/21 1348          DVT prophylaxis: Rivaroxaban  Code Status: Full code  Family Communication: No family at bedside  CONSULTS: Infectious disease, urology    Objective    Physical Examination:   General-appears in no acute distress Heart-S1-S2, regular, no murmur auscultated Lungs-clear to auscultation bilaterally, no wheezing or crackles auscultated Abdomen-soft, nontender, no organomegaly Extremities-bilateral trace edema in the lower extremities Neuro-alert, oriented x3, no focal deficit noted  Status is: Inpatient ; Candida bacteremia         [place]   [facility] If 7PM-7AM, please contact night-coverage at [email or web address], Office  [phone]   09/08/2021, 2:18 PM  LOS: 5 days

## 2021-09-09 ENCOUNTER — Inpatient Hospital Stay: Payer: Self-pay

## 2021-09-09 DIAGNOSIS — E871 Hypo-osmolality and hyponatremia: Secondary | ICD-10-CM | POA: Diagnosis not present

## 2021-09-09 DIAGNOSIS — N179 Acute kidney failure, unspecified: Secondary | ICD-10-CM | POA: Diagnosis not present

## 2021-09-09 DIAGNOSIS — R109 Unspecified abdominal pain: Secondary | ICD-10-CM

## 2021-09-09 DIAGNOSIS — A419 Sepsis, unspecified organism: Secondary | ICD-10-CM | POA: Diagnosis not present

## 2021-09-09 DIAGNOSIS — N390 Urinary tract infection, site not specified: Secondary | ICD-10-CM | POA: Diagnosis not present

## 2021-09-09 LAB — BASIC METABOLIC PANEL
Anion gap: 6 (ref 5–15)
BUN: 37 mg/dL — ABNORMAL HIGH (ref 6–20)
CO2: 20 mmol/L — ABNORMAL LOW (ref 22–32)
Calcium: 7.8 mg/dL — ABNORMAL LOW (ref 8.9–10.3)
Chloride: 103 mmol/L (ref 98–111)
Creatinine, Ser: 1.71 mg/dL — ABNORMAL HIGH (ref 0.44–1.00)
GFR, Estimated: 37 mL/min — ABNORMAL LOW (ref >60)
Glucose, Bld: 174 mg/dL — ABNORMAL HIGH (ref 70–99)
Potassium: 3.8 mmol/L (ref 3.5–5.1)
Sodium: 129 mmol/L — ABNORMAL LOW (ref 135–145)

## 2021-09-09 LAB — GLUCOSE, CAPILLARY
Glucose-Capillary: 155 mg/dL — ABNORMAL HIGH (ref 70–99)
Glucose-Capillary: 131 mg/dL — ABNORMAL HIGH (ref 70–99)
Glucose-Capillary: 130 mg/dL — ABNORMAL HIGH (ref 70–99)
Glucose-Capillary: 111 mg/dL — ABNORMAL HIGH (ref 70–99)
Glucose-Capillary: 160 mg/dL — ABNORMAL HIGH (ref 70–99)

## 2021-09-09 NOTE — Progress Notes (Signed)
Pt has not wanted to eat so far today and has been sleeping all day.

## 2021-09-09 NOTE — TOC Progression Note (Signed)
Transition of Care [facility]) - Progression Note   Patient Details  Name: Karen Bennett MRN: [id number] Date of Birth: 1976/07/10  [facility]) CM/SW Contact  Ewing Schlein, LCSW Phone Number: 09/09/2021, 1:20 PM  Clinical Narrative: [facility] will not accept the patient back for [facility] services. Pam with [employer] to meet with patient today. HH will likely be a barrier to discharge. TOC to follow.  Expected Discharge Plan: Home w Home Health Services Barriers to Discharge: Continued Medical Work up  Expected Discharge Plan and Services Expected Discharge Plan: Home w Home Health Services In-house Referral: Clinical Social Work Post Acute Care Choice: Home Health Living arrangements for the past 2 months: Apartment              DME Arranged: N/A DME Agency: NA HH Arranged: RN, IV Antibiotics HH Agency: Surveyor, mining Date HH Agency Contacted: 09/07/21 Time HH Agency Contacted: 1433 Representative spoke with at [facility] Agency: Pam  Readmission Risk Interventions Readmission Risk Prevention Plan 09/07/2021 01/05/2021  Transportation Screening Complete Complete  PCP or Specialist Appt within 5-7 Days - Complete  Home Care Screening - Complete  HRI or Home Care Consult Complete -  SW Recovery Care/Counseling Consult Complete -  Palliative Care Screening Not Applicable -  Some recent data might be hidden

## 2021-09-09 NOTE — Progress Notes (Signed)
[facility]  PROGRESS NOTE  Karen Bennett [id number] DOB: 02/17/1976 DOA: 09/03/2021 PCP: Pablo Lawrence, NP  Brief hospital course Karen Bennett is a 46 y.o. female with medical history significant of anxiety, depression, cellulitis of the lower extremities, type 2 diabetes, infective endocarditis with MSSA, history of DVT, history of PE not taking anticoagulation, hypertension, mixed hypertriglyceridemia, class III obesity with a BMI of 68.11 kg/m, tobacco use, right BKA, active smoker, noncompliance with medical treatment  who last took her medications at least a month ago presented to hospital with nausea, vomiting, diarrhea, right-sided abdominal pain with polyuria fever and chills.  Patient had low-grade fever and leukocytosis on presentation.  Lipase was normal.  Sodium was low at 123 and glucose was elevated at 603.  LFTs showed low albumin.  Chest x-ray without any acute findings.  CT scan of the abdomen and pelvis showed chronically obstructing 1.3 x 1.0 cm nephrolith at the right UP junction.  During hospitalization, patient underwent right stent placement by urology.  Blood cultures were negative but patient continued to have fever.  Renal function has trended up from initial improvement.  Blood cultures showing Candida.  ID has been consulted including urology at this time.     Subjective   Patient seen and examined, no new complaints.  PICC line could not be placed by IV team.     Assessment and Plan:  * Sepsis secondary to UTI [facility])- (present on admission) -Status post ureteric stone removal and stent placement.    -Blood cultures showed candida glabrata. Urine culture showed no growth.  - Patient started on Eraxis, cefepime discontinued by ID.   -Repeat blood cultures obtained, if negative, ID recommends putting PICC line on Sunday. -PICC line could not be placed by IV team, will consult IR on Sunday if blood cultures remain negative  -Dr. Louanne Belton spoke with  Dr. Diona Fanti urology as well who recommended continuation of treatment    AKI (acute kidney injury) [facility])  -Likely contrast induced nephropathy, patient was given IV contrast on 09/03/2021 for CT abdomen/pelvis -Creatinine jumped from 1.04 on 09/03/2021 to 2.31 on 09/06/2021 -Today creatinine is down to 1.71 after she was started on normal saline at 100 mL/h -Renal ultrasound shows medical renal disease -Continue Foley catheter   Pressure injury of back, stage 2 ([place])- (present on admission) Pressure Injury 09/03/21 Sacrum Medial Stage 2 -  Partial thickness loss of dermis presenting as a shallow open injury with a red, pink wound bed without slough. (Active)  09/03/21 1845  Location: Sacrum  Location Orientation: Medial  Staging: Stage 2 -  Partial thickness loss of dermis presenting as a shallow open injury with a red, pink wound bed without slough.  Wound Description (Comments):   Present on Admission: Yes     Pressure Injury 09/03/21 Buttocks Right Stage 2 -  Partial thickness loss of dermis presenting as a shallow open injury with a red, pink wound bed without slough. (Active)  09/03/21 1845  Location: Buttocks  Location Orientation: Right  Staging: Stage 2 -  Partial thickness loss of dermis presenting as a shallow open injury with a red, pink wound bed without slough.  Wound Description (Comments):   Present on Admission: Yes   Continue wound care    Obstruction of right ureteropelvic junction (UPJ) due to stone- (present on admission) -Status post cystoscopy with right retrograde ureteropyelogram and insertion of double-J stent on 09/03/2021 by Dr. Diona Fanti urology.  - Continue antifungals for now  Hx of  right BKA ([place]) -Supportive care.   -Patient does have home health aide and wheelchair at baseline.  Tobacco abuse- (present on admission) Declined nicotine patch.  Noncompliance with treatment plan Patient states that she was low in mood and was not taking her  medications recently.  Emphasized compliance with medication including insulin.  Diabetic hyperosmolar non-ketotic state ([place])- (present on admission) -Resolved  at this time.   -Continue sliding scale insulin, long-acting insulin Accu-Cheks, diabetic diet. -CBG well controlled  History of pulmonary embolism- (present on admission) - Continue Xarelto  HTN (hypertension)- (present on admission) -Continue metoprolol.   -Lisinopril on  hold at this time.   BMI 60.0-69.9, adult ([place]) -Morbid obesity.   -Will benefit from weight loss as outpatient.  HLD (hyperlipidemia)- (present on admission) -Continue Lipitor 40 milligrams daily.  Diabetic peripheral neuropathy associated with type 2 diabetes mellitus ([place])- (present on admission) -Continue supportive care  Hyponatremia- (present on admission) -Sodium was 122 yesterday, improved to 129 today after she was put on fluid restriction of 1.5 L/day -She presented with sodium of 123 -Glucose has normalized -Likely increased free water intake and osmotic diuresis from hyperglycemi -urine osmolality is 169, TSH 3.731            Medications     atorvastatin  40 mg Oral q1800   buPROPion ER  100 mg Oral q morning   Chlorhexidine Gluconate Cloth  6 each Topical Daily   insulin aspart  0-20 Units Subcutaneous TID WC   insulin aspart  6 Units Subcutaneous TID WC   insulin glargine-yfgn  40 Units Subcutaneous Daily   mouth rinse  15 mL Mouth Rinse BID   rivaroxaban  20 mg Oral QPM   sodium chloride flush  3 mL Intravenous Q12H   traZODone  150 mg Oral QHS     Data Reviewed:   CBG:  Recent Labs  Lab 09/08/21 1235 09/08/21 1710 09/08/21 2218 09/09/21 0815 09/09/21 1147  GLUCAP 93 117* 126* 155* 131*    SpO2: 96 % O2 Flow Rate (L/min): 4 L/min    Vitals:   09/08/21 1329 09/08/21 2236 09/09/21 0613 09/09/21 1257  BP: (!) 117/55 134/62 (!) 109/53 (!) 107/53  Pulse: 74 71 68 65  Resp: 18 16 16 16   Temp: 98.5 F  (36.9 C) 99.1 F (37.3 C) 98.1 F (36.7 C) 98 F (36.7 C)  TempSrc: Oral Oral Oral   SpO2: 97% 97% 93% 96%  Weight:      Height:          Data Reviewed:  Basic Metabolic Panel: Recent Labs  Lab 09/05/21 0255 09/06/21 0426 09/07/21 0715 09/08/21 0405 09/09/21 0404  NA 123* 121* 122* 127* 129*  K 3.8 3.9 3.6 3.9 3.8  CL 95* 94* 95* 101 103  CO2 20* 18* 20* 19* 20*  GLUCOSE 230* 146* 132* 118* 174*  BUN 34* 41* 43* 40* 37*  CREATININE 2.04* 2.31* 2.21* 1.85* 1.71*  CALCIUM 7.6* 7.6* 7.6* 7.8* 7.8*  MG 2.0 2.0 2.0  --   --   PHOS 4.2 4.7* 4.5  --   --     CBC: Recent Labs  Lab 09/03/21 0540 09/04/21 0259 09/05/21 0522 09/06/21 0426 09/07/21 0715 09/08/21 0405  WBC 13.0* 11.6* 8.1 6.7 6.3 7.6  NEUTROABS 12.1* 10.3* 6.0  --   --   --   HGB 12.9 14.5 10.7* 10.0* 9.8* 9.0*  HCT 38.7 46.2* 31.6* 30.5* 29.9* 27.9*  MCV 85.2 90.2 84.5 86.2 84.7 86.6  PLT 322 218 248 220 249 268    LFT Recent Labs  Lab 09/03/21 0540 09/05/21 0255 09/06/21 0426 09/08/21 0405  AST 15 24 17  11*  ALT 10 13 14 11   ALKPHOS 91 68 64 70  BILITOT 0.9 0.2* 0.2* 0.3  PROT 7.5 5.8* 6.1* 5.9*  ALBUMIN 2.1* 1.5* 1.6* <1.5*     Micro blood culture from 09/03/2021 showed Candida glabrata, sensitivities are pending Urine culture 09/04/2021 showed no growth Repeat blood cultures obtained 09/07/2021    Antibiotics: Anti-infectives (From admission, onward)    Start     Dose/Rate Route Frequency Ordered Stop   09/07/21 0400  anidulafungin (ERAXIS) 100 mg in sodium chloride 0.9 % 100 mL IVPB       See Hyperspace for full Linked Orders Report.   100 mg 78 mL/hr over 100 Minutes Intravenous Every 24 hours 09/06/21 0248     09/06/21 0400  anidulafungin (ERAXIS) 200 mg in sodium chloride 0.9 % 200 mL IVPB       See Hyperspace for full Linked Orders Report.   200 mg 78 mL/hr over 200 Minutes Intravenous  Once 09/06/21 0248 09/06/21 0722   09/03/21 2000  ceFEPIme (MAXIPIME) 2 g in sodium  chloride 0.9 % 100 mL IVPB  Status:  Discontinued        2 g 200 mL/hr over 30 Minutes Intravenous Every 8 hours 09/03/21 1557 09/06/21 1037   09/03/21 1145  ceFEPIme (MAXIPIME) 2 g in sodium chloride 0.9 % 100 mL IVPB        2 g 200 mL/hr over 30 Minutes Intravenous  Once 09/03/21 1133 09/03/21 1208   09/03/21 1145  metroNIDAZOLE (FLAGYL) IVPB 500 mg        500 mg 100 mL/hr over 60 Minutes Intravenous  Once 09/03/21 1133 09/03/21 1348          DVT prophylaxis: Rivaroxaban  Code Status: Full code  Family Communication: No family at bedside  CONSULTS: Infectious disease, urology    Objective    Physical Examination:   General-appears in no acute distress Heart-S1-S2, regular, no murmur auscultated Lungs-clear to auscultation bilaterally, no wheezing or crackles auscultated Abdomen-soft, nontender, no organomegaly Extremities-no edema in the lower extremities Neuro-alert, oriented x3, no focal deficit noted  Status is: Inpatient ; Candida bacteremia   Alex   [facility] If 7PM-7AM, please contact night-coverage at [email or web address], Office  [phone]   09/09/2021, 1:58 PM  LOS: 6 days

## 2021-09-09 NOTE — Progress Notes (Signed)
PHARMACY CONSULT NOTE FOR:  OUTPATIENT  PARENTERAL ANTIBIOTIC THERAPY (OPAT)  Indication: Candidemia (Candida Glabrata) Regimen: Caspofungin IV 50mg  once daily End date: March 10th, 2023  IV antibiotic discharge orders are pended. To discharging provider:  please sign these orders via discharge navigator,  Select New Orders & click on the button choice - Manage This Unsigned Work.     Thank you for allowing pharmacy to be a part of this patient's care.  March 12th, 2023, PharmD Candidate [phone] 09/09/2021, 10:22 AM

## 2021-09-09 NOTE — Progress Notes (Addendum)
Informed by picc team that the patient is a difficult picc placement. Will need IR order for picc placement tomorrow if the repeat blood cultures remain negative tomorrow.  Thayer Headings, MD

## 2021-09-09 NOTE — Progress Notes (Addendum)
[facility] for Infectious Disease   Reason for visit: Follow up on Candidemia  Interval History: repeat blood cultures remain ngtd; remains afebrile.  No new complaints.    Day 5 anidulafungin  Physical Exam: Constitutional:  Vitals:   09/08/21 2236 09/09/21 0613  BP: 134/62 (!) 109/53  Pulse: 71 68  Resp: 16 16  Temp: 99.1 F (37.3 C) 98.1 F (36.7 C)  SpO2: 97% 93%   patient appears in NAD Respiratory: Normal respiratory effort; CTA B Cardiovascular: RRR GI: soft, nt, nd  Review of Systems: Constitutional: negative for fevers and chills Gastrointestinal: negative for vomiting  Lab Results  Component Value Date   WBC 7.6 09/08/2021   HGB 9.0 (L) 09/08/2021   HCT 27.9 (L) 09/08/2021   MCV 86.6 09/08/2021   PLT 268 09/08/2021    Lab Results  Component Value Date   CREATININE 1.71 (H) 09/09/2021   BUN 37 (H) 09/09/2021   NA 129 (L) 09/09/2021   K 3.8 09/09/2021   CL 103 09/09/2021   CO2 20 (L) 09/09/2021    Lab Results  Component Value Date   ALT 11 09/08/2021   AST 11 (L) 09/08/2021   ALKPHOS 70 09/08/2021     Microbiology: Recent Results (from the past 240 hour(s))  Resp Panel by RT-PCR (Flu A&B, Covid) Nasopharyngeal Swab     Status: None   Collection Time: 09/03/21  5:57 AM   Specimen: Nasopharyngeal Swab; Nasopharyngeal(NP) swabs in vial transport medium  Result Value Ref Range Status   SARS Coronavirus 2 by RT PCR NEGATIVE NEGATIVE Final    Comment: (NOTE) SARS-CoV-2 target nucleic acids are NOT DETECTED.  The SARS-CoV-2 RNA is generally detectable in upper respiratory specimens during the acute phase of infection. The lowest concentration of SARS-CoV-2 viral copies this assay can detect is 138 copies/mL. A negative result does not preclude SARS-Cov-2 infection and should not be used as the sole basis for treatment or other patient management decisions. A negative result may occur with  improper specimen collection/handling, submission  of specimen other than nasopharyngeal swab, presence of viral mutation(s) within the areas targeted by this assay, and inadequate number of viral copies(<138 copies/mL). A negative result must be combined with clinical observations, patient history, and epidemiological information. The expected result is Negative.  Fact Sheet for Patients:  EntrepreneurPulse.com.au  Fact Sheet for Healthcare Providers:  IncredibleEmployment.be  This test is no t yet approved or cleared by the [place] FDA and  has been authorized for detection and/or diagnosis of SARS-CoV-2 by FDA under an Emergency Use Authorization (EUA). This EUA will remain  in effect (meaning this test can be used) for the duration of the COVID-19 declaration under Section 564(b)(1) of the Act, 21 U.S.C.section 360bbb-3(b)(1), unless the authorization is terminated  or revoked sooner.       Influenza A by PCR NEGATIVE NEGATIVE Final   Influenza B by PCR NEGATIVE NEGATIVE Final    Comment: (NOTE) The Xpert Xpress SARS-CoV-2/FLU/RSV plus assay is intended as an aid in the diagnosis of influenza from Nasopharyngeal swab specimens and should not be used as a sole basis for treatment. Nasal washings and aspirates are unacceptable for Xpert Xpress SARS-CoV-2/FLU/RSV testing.  Fact Sheet for Patients: EntrepreneurPulse.com.au  Fact Sheet for Healthcare Providers: IncredibleEmployment.be  This test is not yet approved or cleared by the [place] FDA and has been authorized for detection and/or diagnosis of SARS-CoV-2 by FDA under an Emergency Use Authorization (EUA). This EUA will remain  in effect (meaning this test can be used) for the duration of the COVID-19 declaration under Section 564(b)(1) of the Act, 21 U.S.C. section 360bbb-3(b)(1), unless the authorization is terminated or revoked.  Performed at [facility], [place]  [address] [place], [place] [address]   Blood culture (routine x 2)     Status: Abnormal (Preliminary result)   Collection Time: 09/03/21  1:04 PM   Specimen: BLOOD  Result Value Ref Range Status   Specimen Description   Final    BLOOD BLOOD RIGHT FOREARM Performed at [facility] [address], [place], [place] [address]    Special Requests   Final    BOTTLES DRAWN AEROBIC AND ANAEROBIC Blood Culture adequate volume Performed at [facility] [address], [place], [place] [address]    Culture  Setup Time   Final    YEAST IN BOTH AEROBIC AND ANAEROBIC BOTTLES CRITICAL RESULT CALLED TO, READ BACK BY AND VERIFIED WITH: PHARMD MICHELLE L. 09/06/21'@2' :05 BY TW    Culture (A)  Final    CANDIDA GLABRATA Sent to [place] for further susceptibility testing. Performed at [facility], [place] [address], [place], [place] [address]    Report Status PENDING  Incomplete  Blood culture (routine x 2)     Status: Abnormal (Preliminary result)   Collection Time: 09/03/21  1:04 PM   Specimen: BLOOD  Result Value Ref Range Status   Specimen Description   Final    BLOOD LEFT ANTECUBITAL Performed at [facility] [address] [place], [place] [address]    Special Requests   Final    BOTTLES DRAWN AEROBIC AND ANAEROBIC Blood Culture adequate volume Performed at [facility] [address] [place], [place] [address]    Culture  Setup Time   Final    YEAST IN BOTH AEROBIC AND ANAEROBIC BOTTLES CRITICAL VALUE NOTED.  VALUE IS CONSISTENT WITH PREVIOUSLY REPORTED AND CALLED VALUE. Performed at [facility], [place] [address] [place], [place] [address]    Culture CANDIDA GLABRATA (A)  Final   Report Status PENDING  Incomplete  Blood Culture ID Panel (Reflexed)     Status: Abnormal   Collection Time: 09/03/21  1:04 PM  Result Value Ref Range Status   Enterococcus faecalis NOT DETECTED NOT DETECTED Final    Enterococcus Faecium NOT DETECTED NOT DETECTED Final   Listeria monocytogenes NOT DETECTED NOT DETECTED Final   Staphylococcus species NOT DETECTED NOT DETECTED Final   Staphylococcus aureus (BCID) NOT DETECTED NOT DETECTED Final   Staphylococcus epidermidis NOT DETECTED NOT DETECTED Final   Staphylococcus lugdunensis NOT DETECTED NOT DETECTED Final   Streptococcus species NOT DETECTED NOT DETECTED Final   Streptococcus agalactiae NOT DETECTED NOT DETECTED Final   Streptococcus pneumoniae NOT DETECTED NOT DETECTED Final   Streptococcus pyogenes NOT DETECTED NOT DETECTED Final   A.calcoaceticus-baumannii NOT DETECTED NOT DETECTED Final   Bacteroides fragilis NOT DETECTED NOT DETECTED Final   Enterobacterales NOT DETECTED NOT DETECTED Final   Enterobacter cloacae complex NOT DETECTED NOT DETECTED Final   Escherichia coli NOT DETECTED NOT DETECTED Final   Klebsiella aerogenes NOT DETECTED NOT DETECTED Final   Klebsiella oxytoca NOT DETECTED NOT DETECTED Final   Klebsiella pneumoniae NOT DETECTED NOT DETECTED Final   Proteus species NOT DETECTED NOT DETECTED Final   Salmonella species NOT DETECTED NOT DETECTED Final   Serratia marcescens NOT DETECTED NOT DETECTED Final   Haemophilus influenzae NOT DETECTED  NOT DETECTED Final   Neisseria meningitidis NOT DETECTED NOT DETECTED Final   Pseudomonas aeruginosa NOT DETECTED NOT DETECTED Final   Stenotrophomonas maltophilia NOT DETECTED NOT DETECTED Final   Candida albicans NOT DETECTED NOT DETECTED Final   Candida auris NOT DETECTED NOT DETECTED Final   Candida glabrata DETECTED (A) NOT DETECTED Final    Comment: CRITICAL RESULT CALLED TO, READ BACK BY AND VERIFIED WITH: PHARMD MICHELLE L. 09/06/21'@2' :05 BY TW    Candida krusei NOT DETECTED NOT DETECTED Final   Candida parapsilosis NOT DETECTED NOT DETECTED Final   Candida tropicalis NOT DETECTED NOT DETECTED Final   Cryptococcus neoformans/gattii NOT DETECTED NOT DETECTED Final    Comment:  Performed at [facility] [address], [place], [place] [address]  Antifungal AST 9 Drug Panel     Status: None   Collection Time: 09/03/21  3:57 PM  Result Value Ref Range Status   Organism ID, Yeast Preliminary report  Final    Comment: (NOTE) Specimen has been received and testing has been initiated. Performed At: [facility] [place], [place] [id number] Rush Farmer MD AT:[id number]    Amphotericin B MIC PENDING  Incomplete   Anidulafungin MIC PENDING  Incomplete   Caspofungin MIC PENDING  Incomplete   Micafungin MIC PENDING  Incomplete   Posaconazole MIC PENDING  Incomplete   Fluconazole Islt MIC PENDING  Incomplete   Flucytosine MIC PENDING  Incomplete   Itraconazole MIC PENDING  Incomplete   Voriconazole MIC PENDING  Incomplete   Source BLOOD  Corrected    Comment: Performed at [facility], [place]. [address], [place], [place] [address] CORRECTED ON 02/15 AT 0919: PREVIOUSLY REPORTED AS [id number]   MRSA Next Gen by PCR, Nasal     Status: None   Collection Time: 09/03/21  6:45 PM   Specimen: Nasal Mucosa; Nasal Swab  Result Value Ref Range Status   MRSA by PCR Next Gen NOT DETECTED NOT DETECTED Final    Comment: (NOTE) The GeneXpert MRSA Assay (FDA approved for NASAL specimens only), is one component of a comprehensive MRSA colonization surveillance program. It is not intended to diagnose MRSA infection nor to guide or monitor treatment for MRSA infections. Test performance is not FDA approved in patients less than 76 years old. Performed at [facility], [place] [address], [place], [place] [address]   Urine Culture     Status: None   Collection Time: 09/04/21 10:52 AM   Specimen: Urine, Clean Catch  Result Value Ref Range Status   Specimen Description   Final    URINE, CLEAN CATCH Performed at [facility], [place] [address] [place], [place] [address]    Special Requests   Final     NONE Performed at [facility], [place] [address] [place], [place] [address]    Culture   Final    NO GROWTH Performed at [facility], [place] [address] [place], [place] [address]    Report Status 09/05/2021 FINAL  Final  Culture, blood (routine x 2)     Status: None (Preliminary result)   Collection Time: 09/07/21 12:20 PM   Specimen: BLOOD  Result Value Ref Range Status   Specimen Description   Final    BLOOD RIGHT ANTECUBITAL Performed at [facility] [address], [place],  [address]    Special Requests   Final    BOTTLES DRAWN AEROBIC AND ANAEROBIC Blood Culture adequate  volume Performed at [facility], [place] [address], [place], [place] [address]    Culture   Final    NO GROWTH 2 DAYS Performed at [facility] [address], [place], [place] [address]    Report Status PENDING  Incomplete  Culture, blood (routine x 2)     Status: None (Preliminary result)   Collection Time: 09/07/21  2:08 PM   Specimen: BLOOD  Result Value Ref Range Status   Specimen Description   Final    BLOOD LEFT ANTECUBITAL Performed at [facility] [address] [place] [address] [place], [place] [address]    Special Requests   Final    BOTTLES DRAWN AEROBIC ONLY Blood Culture adequate volume Performed at [facility] [address] [place], [place] [address]    Culture   Final    NO GROWTH 2 DAYS Performed at [facility] [address], [place], [place] [address]    Report Status PENDING  Incomplete    Impression/Plan:  1. Candidemia - with C glabrata and sensitivities sent.  With a significant chance of azole resistance, will use IV caspofungin at discharge for 3 weeks for the renal abscess.  It does not appear that the abscess will undergo any drainage procedure though I do not see any note about it.  Would be ideal for drainage if possible.   No visual changes so no indication for  ophthalmologic exam at this time.    2.  Medication monitoring - will get weekly labs on IV antibiotics.  3.  OPAT - will order picc line for tomorrow if repeat blood cultures remain negative  Diagnosis: Candidemia, renal stone with obstruction and infection, renal abscess  Culture Result: Candida glabrata  Allergies  Allergen Reactions   Clindamycin Nausea And Vomiting   Zofran Itching and Nausea And Vomiting   Doxycycline Nausea And Vomiting   Morphine And Related Itching    OPAT Orders Discharge antibiotics to be given via PICC line Discharge antibiotics: Caspofungin IV 50 mg once daily Per pharmacy protocol yes Duration: 3 weeks End Date: September 30, 2021  [place] Per Protocol: yes  Home health RN for IV administration and teaching; PICC line care and labs.    Labs weekly while on IV antibiotics: _x_ CBC with differential __ BMP _x_ CMP __ CRP __ ESR __ Vancomycin trough __ CK  __x Please pull PIC at completion of IV antibiotics __ Please leave PIC in place until doctor has seen patient or been notified  Fax weekly labs to [phone]  Clinic Follow Up Appt: March 8   @ 2pm with Dr. Linus Salmons

## 2021-09-09 NOTE — Plan of Care (Signed)
°  Problem: Education: Goal: Knowledge of General Education information will improve Description: Including pain rating scale, medication(s)/side effects and non-pharmacologic comfort measures Outcome: Progressing   Problem: Health Behavior/Discharge Planning: Goal: Ability to manage health-related needs will improve Outcome: Progressing   Problem: Clinical Measurements: Goal: Ability to maintain clinical measurements within normal limits will improve Outcome: Progressing Goal: Diagnostic test results will improve Outcome: Progressing Goal: Respiratory complications will improve Outcome: Progressing Goal: Cardiovascular complication will be avoided Outcome: Progressing   Problem: Nutrition: Goal: Adequate nutrition will be maintained Outcome: Progressing   Problem: Coping: Goal: Level of anxiety will decrease Outcome: Progressing   Problem: Pain Managment: Goal: General experience of comfort will improve Outcome: Progressing   Problem: Safety: Goal: Ability to remain free from injury will improve Outcome: Progressing   Problem: Skin Integrity: Goal: Risk for impaired skin integrity will decrease Outcome: Progressing   Problem: Education: Goal: Ability to describe self-care measures that may prevent or decrease complications (Diabetes Survival Skills Education) will improve Outcome: Progressing Goal: Individualized Educational Video(s) Outcome: Progressing   Problem: Coping: Goal: Ability to adjust to condition or change in health will improve Outcome: Progressing

## 2021-09-09 NOTE — Progress Notes (Signed)
The patient is injury-free, afebrile, alert, and oriented X 3. Vital signs were within the baseline during this shift. Pt denies chest pain, SOB, nausea, vomiting, dizziness, signs or symptoms of bleeding, or acute changes during this shift. We will continue to monitor and work toward achieving the care plan goals °

## 2021-09-10 DIAGNOSIS — A419 Sepsis, unspecified organism: Secondary | ICD-10-CM | POA: Diagnosis not present

## 2021-09-10 DIAGNOSIS — R109 Unspecified abdominal pain: Secondary | ICD-10-CM | POA: Diagnosis not present

## 2021-09-10 DIAGNOSIS — N179 Acute kidney failure, unspecified: Secondary | ICD-10-CM | POA: Diagnosis not present

## 2021-09-10 DIAGNOSIS — E871 Hypo-osmolality and hyponatremia: Secondary | ICD-10-CM | POA: Diagnosis not present

## 2021-09-10 LAB — BASIC METABOLIC PANEL
Anion gap: 5 (ref 5–15)
BUN: 36 mg/dL — ABNORMAL HIGH (ref 6–20)
CO2: 20 mmol/L — ABNORMAL LOW (ref 22–32)
Calcium: 8 mg/dL — ABNORMAL LOW (ref 8.9–10.3)
Chloride: 105 mmol/L (ref 98–111)
Creatinine, Ser: 1.65 mg/dL — ABNORMAL HIGH (ref 0.44–1.00)
GFR, Estimated: 39 mL/min — ABNORMAL LOW (ref >60)
Glucose, Bld: 168 mg/dL — ABNORMAL HIGH (ref 70–99)
Potassium: 3.7 mmol/L (ref 3.5–5.1)
Sodium: 130 mmol/L — ABNORMAL LOW (ref 135–145)

## 2021-09-10 LAB — ANTIFUNGAL AST 9 DRUG PANEL
Amphotericin B MIC: 1
Fluconazole Islt MIC: 16
Flucytosine MIC: 0.06
Itraconazole MIC: 1
Posaconazole MIC: 2
Voriconazole MIC: 0.5

## 2021-09-10 LAB — GLUCOSE, CAPILLARY
Glucose-Capillary: 147 mg/dL — ABNORMAL HIGH (ref 70–99)
Glucose-Capillary: 124 mg/dL — ABNORMAL HIGH (ref 70–99)
Glucose-Capillary: 124 mg/dL — ABNORMAL HIGH (ref 70–99)
Glucose-Capillary: 131 mg/dL — ABNORMAL HIGH (ref 70–99)

## 2021-09-10 NOTE — Progress Notes (Signed)
[facility]  PROGRESS NOTE  Karen Bennett [id number] DOB: 1976/02/20 DOA: 09/03/2021 PCP: Pablo Lawrence, NP  Brief hospital course Karen Bennett is a 46 y.o. female with medical history significant of anxiety, depression, cellulitis of the lower extremities, type 2 diabetes, infective endocarditis with MSSA, history of DVT, history of PE not taking anticoagulation, hypertension, mixed hypertriglyceridemia, class III obesity with a BMI of 68.11 kg/m, tobacco use, right BKA, active smoker, noncompliance with medical treatment  who last took her medications at least a month ago presented to hospital with nausea, vomiting, diarrhea, right-sided abdominal pain with polyuria fever and chills.  Patient had low-grade fever and leukocytosis on presentation.  Lipase was normal.  Sodium was low at 123 and glucose was elevated at 603.  LFTs showed low albumin.  Chest x-ray without any acute findings.  CT scan of the abdomen and pelvis showed chronically obstructing 1.3 x 1.0 cm nephrolith at the right UP junction.  During hospitalization, patient underwent right stent placement by urology.  Blood cultures were negative but patient continued to have fever.  Renal function has trended up from initial improvement.  Blood cultures showing Candida.  ID has been consulted including urology at this time.     Subjective   Patient seen and examined, denies any complaints.  Blood cultures remain negative to date.     Assessment and Plan:  * Sepsis secondary to UTI [facility])- (present on admission) -Status post ureteric stone removal and stent placement.    -Blood cultures showed candida glabrata. Urine culture showed no growth.  - Patient started on Eraxis, cefepime discontinued by ID.   -Repeat blood cultures are negative to date, ID recommends putting PICC line.  We will consult IR for PICC line placement.   -PICC line could not be placed by IV team, will consult IR   -Dr. Louanne Belton spoke with Dr.  Diona Fanti urology as well who recommended continuation of treatment    Diabetic hyperosmolar non-ketotic state ([place])- (present on admission) -Resolved  at this time.   -Continue sliding scale insulin, long-acting insulin Accu-Cheks, diabetic diet. 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 Diona Fanti urology.  - Continue antifungals for now  AKI (acute kidney injury) ([place])  -Likely contrast induced nephropathy, patient was given IV contrast on 09/03/2021 for CT abdomen/pelvis -Creatinine jumped from 1.04 on 09/03/2021 to 2.31 on 09/06/2021 -Today creatinine is down to 1.65 after she was started on normal saline at 100 mL/h -Renal ultrasound shows medical renal disease -Continue Foley catheter   Pressure injury of back, stage 2 ([place])- (present on admission) Pressure Injury 09/03/21 Sacrum Medial Stage 2 -  Partial thickness loss of dermis presenting as a shallow open injury with a red, pink wound bed without slough. (Active)  09/03/21 1845  Location: Sacrum  Location Orientation: Medial  Staging: Stage 2 -  Partial thickness loss of dermis presenting as a shallow open injury with a red, pink wound bed without slough.  Wound Description (Comments):   Present on Admission:  Yes     Pressure Injury 09/03/21 Buttocks Right Stage 2 -  Partial thickness loss of dermis presenting as a shallow open injury with a red, pink wound bed  without slough. (Active)  09/03/21 1845  Location: Buttocks  Location Orientation: Right  Staging: Stage 2 -  Partial thickness loss of dermis presenting as a shallow open injury with a red, pink wound bed without slough.  Wound Description (Comments):   Present on Admission: Yes   Continue wound care    Hx of right BKA ([place]) -Supportive care.   -Patient does have home health aide and wheelchair at baseline.  Tobacco abuse- (present on admission) Declined nicotine patch.  Noncompliance with treatment plan Patient states that she was low in mood and was not taking her medications recently.  Emphasized compliance with medication including insulin.  HTN (hypertension)- (present on admission) -Continue metoprolol.   -Lisinopril on  hold at this time.   BMI 60.0-69.9, adult ([place]) -Morbid obesity.   -Will benefit from weight loss as outpatient.  HLD (hyperlipidemia)- (present on admission) -Continue Lipitor 40 milligrams daily.  Diabetic peripheral neuropathy associated with type 2 diabetes mellitus ([place])- (present on admission) -Continue supportive care   Medications     atorvastatin  40 mg Oral q1800   buPROPion ER  100 mg Oral q morning   Chlorhexidine Gluconate Cloth  6 each Topical Daily   insulin aspart  0-20 Units Subcutaneous TID WC   insulin aspart  6 Units Subcutaneous TID WC   insulin glargine-yfgn  40 Units Subcutaneous Daily   mouth rinse  15 mL Mouth Rinse BID   rivaroxaban  20 mg Oral QPM   sodium chloride flush  3 mL Intravenous Q12H   traZODone  150 mg Oral QHS     Data Reviewed:   CBG:  Recent Labs  Lab 09/09/21 1409 09/09/21 1737 09/09/21 2054 09/10/21 0751 09/10/21 1140  GLUCAP 130* 111* 160* 147* 124*    SpO2: 94 % O2 Flow Rate (L/min): 4 L/min    Vitals:   09/09/21 1257 09/09/21 2052 09/10/21 0508 09/10/21 1342  BP: (!) 107/53 (!) 113/49 (!) 110/50 (!) 95/46  Pulse: 65 67 65 69  Resp: 16 16 16 18   Temp: 98 F (36.7 C) 98.4  F (36.9 C) 98.6 F (37 C) 98.2 F (36.8 C)  TempSrc:  Oral Oral Oral  SpO2: 96% 100% 93% 94%  Weight:      Height:          Data Reviewed:  Basic Metabolic Panel: Recent Labs  Lab 09/05/21 0255 09/06/21 0426 09/07/21 0715 09/08/21 0405 09/09/21 0404 09/10/21 0541  NA 123* 121* 122* 127* 129* 130*  K 3.8 3.9 3.6 3.9 3.8 3.7  CL 95* 94* 95* 101 103 105  CO2 20* 18* 20* 19* 20* 20*  GLUCOSE 230* 146* 132* 118* 174* 168*  BUN 34* 41* 43* 40* 37* 36*  CREATININE 2.04* 2.31* 2.21* 1.85* 1.71* 1.65*  CALCIUM 7.6* 7.6* 7.6* 7.8* 7.8* 8.0*  MG 2.0 2.0 2.0  --   --   --   PHOS 4.2 4.7* 4.5  --   --   --     CBC: Recent Labs  Lab 09/04/21 0259 09/05/21 0522 09/06/21 0426 09/07/21 0715 09/08/21 0405  WBC 11.6* 8.1 6.7 6.3 7.6  NEUTROABS 10.3* 6.0  --   --   --   HGB 14.5 10.7* 10.0* 9.8* 9.0*  HCT 46.2* 31.6* 30.5* 29.9* 27.9*  MCV 90.2 84.5  86.2 84.7 86.6  PLT 218 248 220 249 268    LFT Recent Labs  Lab 09/05/21 0255 09/06/21 0426 09/08/21 0405  AST 24 17 11*  ALT 13 14 11   ALKPHOS 68 64 70  BILITOT 0.2* 0.2* 0.3  PROT 5.8* 6.1* 5.9*  ALBUMIN 1.5* 1.6* <1.5*     Micro blood culture from 09/03/2021 showed Candida glabrata, sensitivities are pending Urine culture 09/04/2021 showed no growth Repeat blood cultures obtained 09/07/2021    Antibiotics: Anti-infectives (From admission, onward)    Start     Dose/Rate Route Frequency Ordered Stop   09/07/21 0400  anidulafungin (ERAXIS) 100 mg in sodium chloride 0.9 % 100 mL IVPB       See Hyperspace for full Linked Orders Report.   100 mg 78 mL/hr over 100 Minutes Intravenous Every 24 hours 09/06/21 0248     09/06/21 0400  anidulafungin (ERAXIS) 200 mg in sodium chloride 0.9 % 200 mL IVPB       See Hyperspace for full Linked Orders Report.   200 mg 78 mL/hr over 200 Minutes Intravenous  Once 09/06/21 0248 09/06/21 0722   09/03/21 2000  ceFEPIme (MAXIPIME) 2 g in sodium chloride 0.9 % 100 mL IVPB  Status:   Discontinued        2 g 200 mL/hr over 30 Minutes Intravenous Every 8 hours 09/03/21 1557 09/06/21 1037   09/03/21 1145  ceFEPIme (MAXIPIME) 2 g in sodium chloride 0.9 % 100 mL IVPB        2 g 200 mL/hr over 30 Minutes Intravenous  Once 09/03/21 1133 09/03/21 1208   09/03/21 1145  metroNIDAZOLE (FLAGYL) IVPB 500 mg        500 mg 100 mL/hr over 60 Minutes Intravenous  Once 09/03/21 1133 09/03/21 1348          DVT prophylaxis: Rivaroxaban  Code Status: Full code  Family Communication: No family at bedside  CONSULTS: Infectious disease, urology    Objective    Physical Examination:   General-appears in no acute distress Heart-S1-S2, regular, no murmur auscultated Lungs-clear to auscultation bilaterally, no wheezing or crackles auscultated Abdomen-soft, nontender, no organomegaly Extremities-no edema in the lower extremities Neuro-alert, oriented x3, no focal deficit noted  Status is: Inpatient ; Candida bacteremia   [place]   [facility] If 7PM-7AM, please contact night-coverage at [email or web address], Office  [phone]   09/10/2021, 2:02 PM  LOS: 7 days

## 2021-09-10 NOTE — TOC Progression Note (Signed)
Transition of Care [facility]) - Progression Note   Patient Details  Name: Karen Bennett MRN: [id number] Date of Birth: 1975/08/29  [facility]) CM/SW Contact  Ewing Schlein, LCSW Phone Number: 09/10/2021, 1:23 PM  Clinical Narrative: CSW followed up with Pam with [facility]. Patient will be set up with [facility] for [facility]. TOC to follow.  Expected Discharge Plan: Home w Home Health Services Barriers to Discharge: Continued Medical Work up  Expected Discharge Plan and Services Expected Discharge Plan: Home w Home Health Services In-house Referral: Clinical Social Work Post Acute Care Choice: Home Health Living arrangements for the past 2 months: Apartment          DME Arranged: N/A DME Agency: NA HH Arranged: RN, IV Antibiotics HH Agency: [employer]or, [employer] Date HH Agency Contacted: 09/07/21 Time HH Agency Contacted: 1433 Representative spoke with at [facility] Agency: Pam  Readmission Risk Interventions Readmission Risk Prevention Plan 09/07/2021 01/05/2021  Transportation Screening Complete Complete  PCP or Specialist Appt within 5-7 Days - Complete  Home Care Screening - Complete  HRI or Home Care Consult Complete -  SW Recovery Care/Counseling Consult Complete -  Palliative Care Screening Not Applicable -  Some recent data might be hidden

## 2021-09-11 DIAGNOSIS — E871 Hypo-osmolality and hyponatremia: Secondary | ICD-10-CM | POA: Diagnosis not present

## 2021-09-11 DIAGNOSIS — A419 Sepsis, unspecified organism: Secondary | ICD-10-CM | POA: Diagnosis not present

## 2021-09-11 DIAGNOSIS — N179 Acute kidney failure, unspecified: Secondary | ICD-10-CM | POA: Diagnosis not present

## 2021-09-11 DIAGNOSIS — B3789 Other sites of candidiasis: Secondary | ICD-10-CM

## 2021-09-11 DIAGNOSIS — R109 Unspecified abdominal pain: Secondary | ICD-10-CM | POA: Diagnosis not present

## 2021-09-11 LAB — BASIC METABOLIC PANEL
Anion gap: 3 — ABNORMAL LOW (ref 5–15)
BUN: 31 mg/dL — ABNORMAL HIGH (ref 6–20)
CO2: 20 mmol/L — ABNORMAL LOW (ref 22–32)
Calcium: 8.1 mg/dL — ABNORMAL LOW (ref 8.9–10.3)
Chloride: 107 mmol/L (ref 98–111)
Creatinine, Ser: 1.45 mg/dL — ABNORMAL HIGH (ref 0.44–1.00)
GFR, Estimated: 46 mL/min — ABNORMAL LOW (ref >60)
Glucose, Bld: 132 mg/dL — ABNORMAL HIGH (ref 70–99)
Potassium: 3.7 mmol/L (ref 3.5–5.1)
Sodium: 130 mmol/L — ABNORMAL LOW (ref 135–145)

## 2021-09-11 LAB — GLUCOSE, CAPILLARY
Glucose-Capillary: 153 mg/dL — ABNORMAL HIGH (ref 70–99)
Glucose-Capillary: 117 mg/dL — ABNORMAL HIGH (ref 70–99)
Glucose-Capillary: 106 mg/dL — ABNORMAL HIGH (ref 70–99)
Glucose-Capillary: 134 mg/dL — ABNORMAL HIGH (ref 70–99)

## 2021-09-11 MED ORDER — NYSTATIN 100000 UNIT/GM EX POWD
Freq: Three times a day (TID) | CUTANEOUS | Status: DC
  Filled 2021-09-11: qty 15

## 2021-09-11 NOTE — Progress Notes (Addendum)
[facility]  PROGRESS NOTE  Karen Bennett [id number] DOB: 19-Jul-1976 DOA: 09/03/2021 PCP: Karen Lawrence, NP  Brief hospital course Karen Bennett is a 46 y.o. female with medical history significant of anxiety, depression, cellulitis of the lower extremities, type 2 diabetes, infective endocarditis with MSSA, history of DVT, history of PE not taking anticoagulation, hypertension, mixed hypertriglyceridemia, class III obesity with a BMI of 68.11 kg/m, tobacco use, right BKA, active smoker, noncompliance with medical treatment  who last took her medications at least a month ago presented to hospital with nausea, vomiting, diarrhea, right-sided abdominal pain with polyuria fever and chills.  Patient had low-grade fever and leukocytosis on presentation.  Lipase was normal.  Sodium was low at 123 and glucose was elevated at 603.  LFTs showed low albumin.  Chest x-ray without any acute findings.  CT scan of the abdomen and pelvis showed chronically obstructing 1.3 x 1.0 cm nephrolith at the right UP junction.  During hospitalization, patient underwent right stent placement by urology.  Blood cultures were negative but patient continued to have fever.  Renal function has trended up from initial improvement.  Blood cultures showing Candida.  ID has been consulted including urology at this time.     Subjective   Patient seen and examined, developed erythematous rash on the groin due to excessive sweating.  IR consulted for PICC line placement.  Plan for PICC line placement tomorrow.     Assessment and Plan:  * Sepsis secondary to UTI [facility])- (present on admission) -Status post ureteric stone removal and stent placement.    -Blood cultures showed candida glabrata. Urine culture showed no growth.  - Patient started on Eraxis, cefepime discontinued by ID.   -Repeat blood cultures are negative to date, ID recommends putting PICC line.  We will consult IR for PICC line placement.   -PICC  line could not be placed by IV team, IR consulted -Plan for PICC line on Monday  -Dr. Louanne Bennett spoke with Dr. Diona Bennett urology as well who recommended continuation of treatment    Diabetic hyperosmolar non-ketotic state ([name])- (present on admission) -Resolved  at this time.   -Continue sliding scale insulin, long-acting insulin Accu-Cheks, diabetic diet. 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 Diona Bennett urology.  - Continue antifungals for now  AKI (acute kidney injury) ([name])  -Likely contrast induced nephropathy, patient was given IV contrast on 09/03/2021 for CT abdomen/pelvis -Creatinine jumped from 1.04 on 09/03/2021 to 2.31 on 09/06/2021 -Creatinine is down to 1.65 after she was started on normal saline at 100 mL/h -We will check BMP today -Renal ultrasound shows medical renal disease -Continue Foley catheter   Pressure injury of back, stage 2 ([name])- (present on admission) Pressure Injury 09/03/21 Sacrum Medial Stage 2 -  Partial thickness loss of dermis presenting as a shallow open injury with a red, pink wound bed without slough. (Active)  09/03/21 1845  Location: Sacrum  Location Orientation: Medial  Staging: Stage 2 -  Partial thickness loss of dermis presenting  as a shallow open injury with a red, pink wound bed without slough.  Wound Description (Comments):   Present on Admission: Yes     Pressure Injury  09/03/21 Buttocks Right Stage 2 -  Partial thickness loss of dermis presenting as a shallow open injury with a red, pink wound bed without slough. (Active)  09/03/21 1845  Location: Buttocks  Location Orientation: Right  Staging: Stage 2 -  Partial thickness loss of dermis presenting as a shallow open injury with a red, pink wound bed without slough.  Wound Description (Comments):   Present on Admission: Yes   Continue wound care    Candida rash of groin - We will start nystatin powder 3 times daily  Hx of right BKA ([name]) -Supportive care.   -Patient does have home health aide and wheelchair at baseline.  Tobacco abuse- (present on admission) Declined nicotine patch.  Noncompliance with treatment plan Patient states that she was low in mood and was not taking her medications recently.  Emphasized compliance with medication including insulin.  HTN (hypertension)- (present on admission) -Continue metoprolol.   -Lisinopril on  hold at this time.   BMI 60.0-69.9, adult ([name]) -Morbid obesity.   -Will benefit from weight loss as outpatient.  HLD (hyperlipidemia)- (present on admission) -Continue Lipitor 40 milligrams daily.  Diabetic peripheral neuropathy associated with type 2 diabetes mellitus ([name])- (present on admission) -Continue supportive care   Medications     atorvastatin  40 mg Oral q1800   buPROPion ER  100 mg Oral q morning   Chlorhexidine Gluconate Cloth  6 each Topical Daily   insulin aspart  0-20 Units Subcutaneous TID WC   insulin aspart  6 Units Subcutaneous TID WC   insulin glargine-yfgn  40 Units Subcutaneous Daily   mouth rinse  15 mL Mouth Rinse BID   nystatin   Topical TID   rivaroxaban  20 mg Oral QPM   sodium chloride flush  3 mL Intravenous Q12H   traZODone  150 mg Oral QHS     Data Reviewed:   CBG:  Recent Labs  Lab 09/10/21 1140 09/10/21 1647 09/10/21 2239 09/11/21 0810 09/11/21 1146  GLUCAP 124* 124* 131* 153* 117*    SpO2:  97 % O2 Flow Rate (L/min): 4 L/min    Vitals:   09/10/21 1342 09/10/21 1737 09/10/21 2246 09/11/21 0556  BP: (!) 95/46 (!) 110/53 120/60 (!) 118/59  Pulse: 69 64 70 66  Resp: 18  16 14   Temp: 98.2 F (36.8 C)  98 F (36.7 C) 97.8 F (36.6 C)  TempSrc: Oral  Oral Oral  SpO2: 94%  97% 97%  Weight:      Height:          Data Reviewed:  Basic Metabolic Panel: Recent Labs  Lab 09/05/21 0255 09/06/21 0426 09/07/21 0715 09/08/21 0405 09/09/21 0404 09/10/21 0541  NA 123* 121* 122* 127* 129* 130*  K 3.8 3.9 3.6 3.9 3.8 3.7  CL 95* 94* 95* 101 103 105  CO2 20* 18* 20* 19* 20* 20*  GLUCOSE 230* 146* 132* 118* 174* 168*  BUN 34* 41* 43* 40* 37* 36*  CREATININE 2.04* 2.31* 2.21* 1.85* 1.71* 1.65*  CALCIUM 7.6* 7.6* 7.6* 7.8* 7.8* 8.0*  MG 2.0 2.0 2.0  --   --   --   PHOS 4.2 4.7* 4.5  --   --   --     CBC: Recent Labs  Lab 09/05/21 0522 09/06/21 0426 09/07/21 0715 09/08/21 0405  WBC 8.1 6.7 6.3 7.6  NEUTROABS 6.0  --   --   --   HGB 10.7* 10.0* 9.8* 9.0*  HCT 31.6* 30.5* 29.9* 27.9*  MCV 84.5 86.2 84.7 86.6  PLT 248 220 249 268    LFT Recent Labs  Lab 09/05/21 0255 09/06/21 0426 09/08/21 0405  AST 24 17 11*  ALT 13 14 11   ALKPHOS 68 64 70  BILITOT 0.2* 0.2* 0.3  PROT 5.8* 6.1* 5.9*  ALBUMIN 1.5* 1.6* <1.5*     Micro blood culture from 09/03/2021 showed Candida glabrata, sensitivities are pending Urine culture 09/04/2021 showed no growth Repeat blood cultures obtained 09/07/2021    Antibiotics: Anti-infectives (From admission, onward)    Start     Dose/Rate Route Frequency Ordered Stop   09/07/21 0400  anidulafungin (ERAXIS) 100 mg in sodium chloride 0.9 % 100 mL IVPB       See Hyperspace for full Linked Orders Report.   100 mg 78 mL/hr over 100 Minutes Intravenous Every 24 hours 09/06/21 0248     09/06/21 0400  anidulafungin (ERAXIS) 200 mg in sodium chloride 0.9 % 200 mL IVPB       See Hyperspace for full Linked Orders Report.   200 mg 78  mL/hr over 200 Minutes Intravenous  Once 09/06/21 0248 09/06/21 0722   09/03/21 2000  ceFEPIme (MAXIPIME) 2 g in sodium chloride 0.9 % 100 mL IVPB  Status:  Discontinued        2 g 200 mL/hr over 30 Minutes Intravenous Every 8 hours 09/03/21 1557 09/06/21 1037   09/03/21 1145  ceFEPIme (MAXIPIME) 2 g in sodium chloride 0.9 % 100 mL IVPB        2 g 200 mL/hr over 30 Minutes Intravenous  Once 09/03/21 1133 09/03/21 1208   09/03/21 1145  metroNIDAZOLE (FLAGYL) IVPB 500 mg        500 mg 100 mL/hr over 60 Minutes Intravenous  Once 09/03/21 1133 09/03/21 1348          DVT prophylaxis: Rivaroxaban  Code Status: Full code  Family Communication: No family at bedside  CONSULTS: Infectious disease, urology    Objective    Physical Examination:  General-appears in no acute distress Heart-S1-S2, regular, no murmur auscultated Lungs-clear to auscultation bilaterally, no wheezing or crackles auscultated Abdomen-soft, nontender, no organomegaly Extremities-no edema in the lower extremities Neuro-alert, oriented x3, no focal deficit noted  Status is: Inpatient ; Candida bacteremia   [place]   [facility] If 7PM-7AM, please contact night-coverage at [email or web address], Office  [phone]   09/11/2021, 11:49 AM  LOS: 8 days

## 2021-09-11 NOTE — Assessment & Plan Note (Addendum)
-   Continue nystatin powder 3 times daily

## 2021-09-12 ENCOUNTER — Inpatient Hospital Stay (HOSPITAL_COMMUNITY): Payer: Medicare Other

## 2021-09-12 ENCOUNTER — Inpatient Hospital Stay: Payer: Self-pay

## 2021-09-12 DIAGNOSIS — R109 Unspecified abdominal pain: Secondary | ICD-10-CM | POA: Diagnosis not present

## 2021-09-12 DIAGNOSIS — B3789 Other sites of candidiasis: Secondary | ICD-10-CM | POA: Diagnosis not present

## 2021-09-12 DIAGNOSIS — N179 Acute kidney failure, unspecified: Secondary | ICD-10-CM | POA: Diagnosis not present

## 2021-09-12 DIAGNOSIS — A419 Sepsis, unspecified organism: Secondary | ICD-10-CM | POA: Diagnosis not present

## 2021-09-12 HISTORY — PX: IR US GUIDE VASC ACCESS RIGHT: IMG2390

## 2021-09-12 HISTORY — PX: IR FLUORO GUIDE CV LINE RIGHT: IMG2283

## 2021-09-12 LAB — GLUCOSE, CAPILLARY
Glucose-Capillary: 136 mg/dL — ABNORMAL HIGH (ref 70–99)
Glucose-Capillary: 113 mg/dL — ABNORMAL HIGH (ref 70–99)
Glucose-Capillary: 92 mg/dL (ref 70–99)
Glucose-Capillary: 127 mg/dL — ABNORMAL HIGH (ref 70–99)

## 2021-09-12 LAB — CULTURE, BLOOD (ROUTINE X 2)
Culture: NO GROWTH
Culture: NO GROWTH
Special Requests: ADEQUATE
Special Requests: ADEQUATE

## 2021-09-12 MED ORDER — LIDOCAINE HCL 1 % IJ SOLN
INTRAMUSCULAR | Status: DC | PRN
  Administered 2021-09-12: 10 mL via INTRADERMAL

## 2021-09-12 MED ORDER — LIDOCAINE HCL 1 % IJ SOLN
INTRAMUSCULAR | Status: AC
  Filled 2021-09-12: qty 20

## 2021-09-12 NOTE — TOC Transition Note (Signed)
Transition of Care [facility]) - CM/SW Discharge Note   Patient Details  Name: Karen Bennett MRN: [id number] Date of Birth: 1975/10/28  Transition of Care (TOC) CM/SW Contact:  Armanda Heritage, RN Phone Number: 09/12/2021, 1:18 PM   Clinical Narrative:    CM followed up with Jeri Modena, [employer] is ready to initiate home IV antibiotics, noted HH orders in and agency updated.  No further TOC needs identified.       Barriers to Discharge: Continued Medical Work up   Patient Goals and CMS Choice Patient states their goals for this hospitalization and ongoing recovery are:: Return home CMS Medicare.gov Compare Post Acute Care list provided to:: Patient Choice offered to / list presented to : Patient  Discharge Placement                       Discharge Plan and Services In-house Referral: Clinical Social Work   Post Acute Care Choice: Home Health          DME Arranged: N/A DME Agency: NA       HH Arranged: RN, IV Antibiotics HH Agency: [employer]or, [employer] Date HH Agency Contacted: 09/07/21 Time HH Agency Contacted: 1433 Representative spoke with at [facility] Agency: Pam  Social Determinants of Health (SDOH) Interventions     Readmission Risk Interventions Readmission Risk Prevention Plan 09/12/2021 09/07/2021 01/05/2021  Transportation Screening - Complete Complete  PCP or Specialist Appt within 5-7 Days - - Complete  Home Care Screening - - Complete  Medication Review (RN Care Manager) Complete - -  HRI or Home Care Consult - Complete -  SW Recovery Care/Counseling Consult - Complete -  Palliative Care Screening - Not Applicable -  Skilled Nursing Facility Not Applicable - -  Some recent data might be hidden

## 2021-09-12 NOTE — Procedures (Signed)
Vascular and Interventional Radiology Procedure Note  Patient: Karen Bennett DOB: October 18, 1975 Medical Record Number: [id number] Note Date/Time: 09/12/21 5:46 PM   Performing Physician: Roanna Banning, MD Assistant(s): None  Diagnosis: Poor IV access  Procedure: TUNNELED CENTRAL VENOUS CATHETER PLACEMENT  Anesthesia: Local Anesthetic Complications: None Estimated Blood Loss:  0 mL Specimens:  None  Findings:  Successful placement of right-sided,  25 cm  (tip-to-cuff), tunneled "Powerline" catheter with the tip of the catheter in the proximal right atrium.  Plan: Catheter ready for use.  See detailed procedure note with images in PACS. The patient tolerated the procedure well without incident or complication and was returned to Floor Bed in stable condition.    Roanna Banning, MD Vascular [facility] Interventional Radiology Specialists [facility] Radiology   Pager. [phone] Clinic. [phone]

## 2021-09-12 NOTE — Progress Notes (Addendum)
[facility]  PROGRESS NOTE  Karen Bennett [id number] DOB: 1975-12-23 DOA: 09/03/2021 PCP: Pablo Lawrence, NP  Brief hospital course Karen Bennett is a 46 y.o. female with medical history significant of anxiety, depression, cellulitis of the lower extremities, type 2 diabetes, infective endocarditis with MSSA, history of DVT, history of PE not taking anticoagulation, hypertension, mixed hypertriglyceridemia, class III obesity with a BMI of 68.11 kg/m, tobacco use, right BKA, active smoker, noncompliance with medical treatment  who last took her medications at least a month ago presented to hospital with nausea, vomiting, diarrhea, right-sided abdominal pain with polyuria fever and chills.  Patient had low-grade fever and leukocytosis on presentation.  Lipase was normal.  Sodium was low at 123 and glucose was elevated at 603.  LFTs showed low albumin.  Chest x-ray without any acute findings.  CT scan of the abdomen and pelvis showed chronically obstructing 1.3 x 1.0 cm nephrolith at the right UP junction.  During hospitalization, patient underwent right stent placement by urology.  Blood cultures were negative but patient continued to have fever.  Renal function has trended up from initial improvement.  Blood cultures showing Candida.  ID has been consulted including urology at this time.     Subjective   Patient seen and examined, plan for PICC line placement today.     Assessment and Plan:  * Sepsis secondary to UTI [facility])- (present on admission) -Status post ureteric stone removal and stent placement.    -Blood cultures showed candida glabrata. Urine culture showed no growth.  - Patient started on Eraxis, cefepime discontinued by ID.   -Repeat blood cultures are negative to date, ID recommends putting PICC line.  We will consult IR for PICC line placement.   -PICC line could not be placed by IV team, IR consulted -Plan for PICC line today -ID recommend to continue with  antifungal till September 30, 2021  -Dr. Louanne Belton spoke with Dr. Diona Fanti urology as well who recommended continuation of treatment    Diabetic hyperosmolar non-ketotic state ([place])- (present on admission) -Resolved  at this time.   -Continue sliding scale insulin, long-acting insulin Accu-Cheks, diabetic diet. -CBG well controlled  History of pulmonary embolism- (present on admission) - Continue Xarelto  Hyponatremia- (present on admission) -Sodium was 122 , improved to 130 today after she was put on fluid restriction of 1.5 L/day -She presented with sodium of 123 -Glucose has normalized -Likely increased free water intake and osmotic diuresis from hyperglycemia -urine osmolality is 169, TSH 3.731   Obstruction of right ureteropelvic junction (UPJ) due to stone- (present on admission) -Status post cystoscopy with right retrograde ureteropyelogram and insertion of double-J stent on 09/03/2021 by Dr. Diona Fanti urology.  - Continue antifungals for now  AKI (acute kidney injury) ([place])  -Likely contrast induced nephropathy, patient was given IV contrast on 09/03/2021 for CT abdomen/pelvis -Creatinine jumped from 1.04 on 09/03/2021 to 2.31 on 09/06/2021 -Creatinine is down to 1.65 after she was started on normal saline at 100 mL/h -We will check BMP today -Renal ultrasound shows medical renal disease -We will discontinue  Foley catheter and obtain voiding trial; discussed with Dr. Diona Fanti   Pressure injury of back, stage 2 ([place])- (present on admission) Pressure Injury 09/03/21 Sacrum Medial Stage 2 -  Partial thickness loss of dermis presenting as a shallow open injury with a red, pink wound bed without slough. (Active)  09/03/21 1845  Location: Sacrum  Location Orientation: Medial  Staging: Stage 2 -  Partial thickness loss of  dermis presenting as a shallow open injury with a red, pink wound bed without slough.  Wound Description (Comments):   Present on Admission: Yes     Pressure  Injury 09/03/21 Buttocks Right Stage 2 -  Partial thickness loss of dermis presenting as a shallow open injury with a red, pink wound bed without slough. (Active)  09/03/21 1845  Location: Buttocks  Location Orientation: Right  Staging: Stage 2 -  Partial thickness loss of dermis presenting as a shallow open injury with a red, pink wound bed without slough.  Wound Description (Comments):   Present on Admission: Yes   Continue wound care    Candida rash of groin - Continue nystatin powder 3 times daily  Hx of right BKA ([facility]) -Supportive care.   -Patient does have home health aide and wheelchair at baseline.  Tobacco abuse- (present on admission) Declined nicotine patch.  Noncompliance with treatment plan Patient states that she was low in mood and was not taking her medications recently.  Emphasized compliance with medication including insulin.  HTN (hypertension)- (present on admission) -Continue metoprolol.   -Lisinopril on  hold at this time.   BMI 60.0-69.9, adult ([place]) -Morbid obesity.   -Will benefit from weight loss as outpatient.  HLD (hyperlipidemia)- (present on admission) -Continue Lipitor 40 milligrams daily.  Diabetic peripheral neuropathy associated with type 2 diabetes mellitus ([place])- (present on admission) -Continue supportive care   Medications     atorvastatin  40 mg Oral q1800   buPROPion ER  100 mg Oral q morning   Chlorhexidine Gluconate Cloth  6 each Topical Daily   insulin aspart  0-20 Units Subcutaneous TID WC   insulin aspart  6 Units Subcutaneous TID WC   insulin glargine-yfgn  40 Units Subcutaneous Daily   mouth rinse  15 mL Mouth Rinse BID   nystatin   Topical TID   rivaroxaban  20 mg Oral QPM   sodium chloride flush  3 mL Intravenous Q12H   traZODone  150 mg Oral QHS     Data Reviewed:   CBG:  Recent Labs  Lab 09/11/21 1146 09/11/21 1724 09/11/21 2128 09/12/21 0714 09/12/21 1135  GLUCAP 117* 106* 134* 136* 113*     SpO2: 96 % O2 Flow Rate (L/min): 4 L/min    Vitals:   09/11/21 1412 09/11/21 2134 09/12/21 0616 09/12/21 1319  BP: (!) 101/43 (!) 122/59 (!) 136/58 (!) 126/56  Pulse: 64 66 70 67  Resp: 18 18 18 18   Temp: 98.9 F (37.2 C) 98.2 F (36.8 C) 98.2 F (36.8 C) 99 F (37.2 C)  TempSrc: Oral Oral Oral Oral  SpO2: 96% 93% 95% 96%  Weight:      Height:          Data Reviewed:  Basic Metabolic Panel: Recent Labs  Lab 09/06/21 0426 09/07/21 0715 09/08/21 0405 09/09/21 0404 09/10/21 0541 09/11/21 1206  NA 121* 122* 127* 129* 130* 130*  K 3.9 3.6 3.9 3.8 3.7 3.7  CL 94* 95* 101 103 105 107  CO2 18* 20* 19* 20* 20* 20*  GLUCOSE 146* 132* 118* 174* 168* 132*  BUN 41* 43* 40* 37* 36* 31*  CREATININE 2.31* 2.21* 1.85* 1.71* 1.65* 1.45*  CALCIUM 7.6* 7.6* 7.8* 7.8* 8.0* 8.1*  MG 2.0 2.0  --   --   --   --   PHOS 4.7* 4.5  --   --   --   --     CBC: Recent Labs  Lab 09/06/21  [id number] 09/07/21 0715 09/08/21 0405  WBC 6.7 6.3 7.6  HGB 10.0* 9.8* 9.0*  HCT 30.5* 29.9* 27.9*  MCV 86.2 84.7 86.6  PLT 220 249 268    LFT Recent Labs  Lab 09/06/21 0426 09/08/21 0405  AST 17 11*  ALT 14 11  ALKPHOS 64 70  BILITOT 0.2* 0.3  PROT 6.1* 5.9*  ALBUMIN 1.6* <1.5*     Micro blood culture from 09/03/2021 showed Candida glabrata, sensitivities are pending Urine culture 09/04/2021 showed no growth Repeat blood cultures obtained 09/07/2021    Antibiotics: Anti-infectives (From admission, onward)    Start     Dose/Rate Route Frequency Ordered Stop   09/07/21 0400  anidulafungin (ERAXIS) 100 mg in sodium chloride 0.9 % 100 mL IVPB       See Hyperspace for full Linked Orders Report.   100 mg 78 mL/hr over 100 Minutes Intravenous Every 24 hours 09/06/21 0248     09/06/21 0400  anidulafungin (ERAXIS) 200 mg in sodium chloride 0.9 % 200 mL IVPB       See Hyperspace for full Linked Orders Report.   200 mg 78 mL/hr over 200 Minutes Intravenous  Once 09/06/21 0248 09/06/21 0722    09/03/21 2000  ceFEPIme (MAXIPIME) 2 g in sodium chloride 0.9 % 100 mL IVPB  Status:  Discontinued        2 g 200 mL/hr over 30 Minutes Intravenous Every 8 hours 09/03/21 1557 09/06/21 1037   09/03/21 1145  ceFEPIme (MAXIPIME) 2 g in sodium chloride 0.9 % 100 mL IVPB        2 g 200 mL/hr over 30 Minutes Intravenous  Once 09/03/21 1133 09/03/21 1208   09/03/21 1145  metroNIDAZOLE (FLAGYL) IVPB 500 mg        500 mg 100 mL/hr over 60 Minutes Intravenous  Once 09/03/21 1133 09/03/21 1348          DVT prophylaxis: Rivaroxaban  Code Status: Full code  Family Communication: No family at bedside  CONSULTS: Infectious disease, urology    Objective    Physical Examination:  General-appears in no acute distress Heart-S1-S2, regular, no murmur auscultated Lungs-clear to auscultation bilaterally, no wheezing or crackles auscultated Abdomen-soft, nontender, no organomegaly Extremities-no edema in the lower extremities Neuro-alert, oriented x3, no focal deficit noted  Status is: Inpatient ; Candida bacteremia   [place]   [facility] If 7PM-7AM, please contact night-coverage at [email or web address], Office  [phone]   09/12/2021, 2:25 PM  LOS: 9 days

## 2021-09-12 NOTE — Care Management Important Message (Signed)
Important Message  Patient Details IM Letter given to the Patient. Name: Karen Bennett MRN: [id number] Date of Birth: 04-06-1976   Medicare Important Message Given:  Yes     [name] 09/12/2021, 2:59 PM

## 2021-09-13 DIAGNOSIS — N151 Renal and perinephric abscess: Secondary | ICD-10-CM

## 2021-09-13 DIAGNOSIS — A419 Sepsis, unspecified organism: Secondary | ICD-10-CM | POA: Diagnosis not present

## 2021-09-13 DIAGNOSIS — N179 Acute kidney failure, unspecified: Secondary | ICD-10-CM | POA: Diagnosis not present

## 2021-09-13 DIAGNOSIS — E1100 Type 2 diabetes mellitus with hyperosmolarity without nonketotic hyperglycemic-hyperosmolar coma (NKHHC): Secondary | ICD-10-CM | POA: Diagnosis not present

## 2021-09-13 DIAGNOSIS — Z86711 Personal history of pulmonary embolism: Secondary | ICD-10-CM | POA: Diagnosis not present

## 2021-09-13 LAB — GLUCOSE, CAPILLARY
Glucose-Capillary: 214 mg/dL — ABNORMAL HIGH (ref 70–99)
Glucose-Capillary: 106 mg/dL — ABNORMAL HIGH (ref 70–99)
Glucose-Capillary: 102 mg/dL — ABNORMAL HIGH (ref 70–99)
Glucose-Capillary: 102 mg/dL — ABNORMAL HIGH (ref 70–99)

## 2021-09-13 MED ORDER — OXYCODONE HCL 15 MG PO TABS: 15.0000 mg | ORAL_TABLET | Freq: Four times a day (QID) | ORAL | 0 refills | Status: DC | PRN

## 2021-09-13 MED ORDER — SODIUM CHLORIDE 0.9% FLUSH: 10.0000 mL | INTRAVENOUS | Status: DC | PRN

## 2021-09-13 MED ORDER — SODIUM CHLORIDE 0.9% FLUSH: 10.0000 mL | Freq: Two times a day (BID) | INTRAVENOUS | Status: DC

## 2021-09-13 MED ORDER — CASPOFUNGIN IV (FOR PTA / DISCHARGE USE ONLY): 50.0000 mg | INTRAVENOUS | 0 refills | Status: AC

## 2021-09-13 MED ORDER — HEPARIN SOD (PORK) LOCK FLUSH 100 UNIT/ML IV SOLN
250.0000 [IU] | INTRAVENOUS | Status: AC | PRN
  Administered 2021-09-13: 250 [IU]
  Filled 2021-09-13: qty 2.5

## 2021-09-13 NOTE — Discharge Summary (Signed)
Physician Discharge Summary   Patient: Karen Bennett MRN: [id number] DOB: 1975/11/05  Admit date:     09/03/2021  Discharge date: 09/13/21  Discharge Physician: Oswald Hillock   PCP: Pablo Lawrence, NP   Recommendations at discharge:   Follow-up urology as outpatient  Discharge Diagnoses: Principal Problem:   Sepsis secondary to UTI [facility]) Active Problems:   Diabetic hyperosmolar non-ketotic state ([place])   Hyponatremia   History of pulmonary embolism   Obstruction of right ureteropelvic junction (UPJ) due to stone   AKI (acute kidney injury) ([place])   Pressure injury of back, stage 2 (HCC)   Diabetic peripheral neuropathy associated with type 2 diabetes mellitus (HCC)   HLD (hyperlipidemia)   BMI 60.0-69.9, adult (HCC)   HTN (hypertension)   Noncompliance with treatment plan   Tobacco abuse   Hx of right BKA (HCC)   Candida rash of groin  Resolved Problems:   * No resolved hospital problems. *   Hospital Course: Karen Bennett is a 46 y.o. female with medical history significant of anxiety, depression, cellulitis of the lower extremities, type 2 diabetes, infective endocarditis with MSSA, history of DVT, history of PE not taking anticoagulation, hypertension, mixed hypertriglyceridemia, class III obesity with a BMI of 68.11 kg/m, tobacco use, right BKA, active smoker, noncompliance with medical treatment  who last took her medications at least a month ago presented to hospital with nausea, vomiting, diarrhea, right-sided abdominal pain with polyuria fever and chills.  Patient had low-grade fever and leukocytosis on presentation.  Lipase was normal.  Sodium was low at 123 and glucose was elevated at 603.  LFTs showed low albumin.  Chest x-ray without any acute findings.  CT scan of the abdomen and pelvis showed chronically obstructing 1.3 x 1.0 cm nephrolith at the right UP junction.  During hospitalization, patient underwent right stent placement by urology.  Blood cultures were  negative but patient continued to have fever.  Renal function has trended up from initial improvement.  Blood cultures showing Candida.  ID has been consulted including urology at this time.  Assessment and Plan:  * Sepsis secondary to UTI [facility])- (present on admission) -Status post ureteric stone removal and stent placement.    -Blood cultures showed candida glabrata. Urine culture showed no growth.  - Patient started on Eraxis, cefepime discontinued by ID.   -Repeat blood cultures are negative to date, ID recommends putting PICC line.  We will consult IR for PICC line placement.   -PICC line could not be placed by IV team, IR consulted -Plan for PICC line today -ID recommend to continue with antifungal till September 30, 2021  -Dr. Louanne Belton spoke with Dr. Diona Fanti urology as well who recommended continuation of treatment    Diabetic hyperosmolar non-ketotic state ([place])- (present on admission) -Resolved  at this time.   -Continue home regimen  History of pulmonary embolism- (present on admission) - Continue Xarelto  Hyponatremia- (present on admission) -Sodium was 122 , improved to 130 today after she was put on fluid restriction of 1.5 L/day -She presented with sodium of 123 -Glucose has normalized -Likely increased free water intake and osmotic diuresis from hyperglycemia -urine osmolality is 169, TSH 3.731   Obstruction of right ureteropelvic junction (UPJ) due to stone- (present on admission) -Status post cystoscopy with right retrograde ureteropyelogram and insertion of double-J stent on 09/03/2021 by Dr. Diona Fanti urology.  - Continue antifungals for now -Follow-up urology as outpatient  AKI (acute kidney injury) ([place])  -Likely contrast induced  nephropathy, patient was given IV contrast on 09/03/2021 for CT abdomen/pelvis -Creatinine jumped from 1.04 on 09/03/2021 to 2.31 on 09/06/2021 -Creatinine is down to 1.45 after she was started on normal saline at 100 mL/h -We will check  BMP today -Renal ultrasound shows medical renal disease -Foley catheter was discontinued   Pressure injury of back, stage 2 ([place])- (present on admission) Pressure Injury 09/03/21 Sacrum Medial Stage 2 -  Partial thickness loss of dermis presenting as a shallow open injury with a red, pink wound bed without slough. (Active)  09/03/21 1845  Location: Sacrum  Location Orientation: Medial  Staging: Stage 2 -  Partial thickness loss of dermis presenting as a shallow open injury with a red, pink wound bed without slough.  Wound Description (Comments):   Present on Admission: Yes     Pressure Injury 09/03/21 Buttocks Right Stage 2 -  Partial thickness loss of dermis presenting as a shallow open injury with a red, pink wound bed without slough. (Active)  09/03/21 1845  Location: Buttocks  Location Orientation: Right  Staging: Stage 2 -  Partial thickness loss of dermis presenting as a shallow open injury with a red, pink wound bed without slough.  Wound Description (Comments):   Present on Admission: Yes   Continue wound care  Candida rash of groin -Improved with nystatin powder 3 times daily  Hx of right BKA (HCC) -Supportive care.   -Patient does have home health aide and wheelchair at baseline.  Tobacco abuse- (present on admission) Declined nicotine patch.  Noncompliance with treatment plan Patient states that she was low in mood and was not taking her medications recently.  Emphasized compliance with medication including insulin.  HTN (hypertension)- (present on admission) -Continue metoprolol -Lisinopril has been discontinued   BMI 60.0-69.9, adult (HCC) -Morbid obesity.   -Will benefit from weight loss as outpatient.  HLD (hyperlipidemia)- (present on admission) -Continue Lipitor 40 milligrams daily.  Diabetic peripheral neuropathy associated with type 2 diabetes mellitus ([place])- (present on admission) -Continue supportive care         Pain control - [place] Controlled Substance Reporting System database was reviewed. and patient was instructed, not to drive, operate heavy machinery, perform activities at heights, swimming or participation in water activities or provide baby-sitting services while on Pain, Sleep and Anxiety Medications; until their outpatient Physician has advised to do so again. Also recommended to not to take more than prescribed Pain, Sleep and Anxiety Medications.   Consultants: Urology, infectious disease Procedures performed: Ureteral stent placement Disposition: Home Diet recommendation:  Discharge Diet Orders (From admission, onward)     Start     Ordered   09/13/21 0000  Diet - low sodium heart healthy        09/13/21 1152           Carb modified diet  DISCHARGE MEDICATION: Allergies as of 09/13/2021       Reactions   Clindamycin Nausea And Vomiting   Zofran Itching, Nausea And Vomiting   Doxycycline Nausea And Vomiting   Morphine And Related Itching        Medication List     STOP taking these medications    fluconazole 200 MG tablet Commonly known as: DIFLUCAN   insulin detemir 100 UNIT/ML injection Commonly known as: Levemir   lisinopril 2.5 MG tablet Commonly known as: ZESTRIL       TAKE these medications    acetaminophen 325 MG tablet Commonly known as: TYLENOL Take 2 tablets (650 mg  total) by mouth every 6 (six) hours as needed for mild pain or moderate pain.   atorvastatin 40 MG tablet Commonly known as: LIPITOR Take 1 tablet (40 mg total) by mouth daily at 6 PM.   BIOTIN PO Take 1 Dose by mouth daily.   buPROPion ER 100 MG 12 hr tablet Commonly known as: WELLBUTRIN SR Take 100 mg by mouth every morning.   caspofungin  IVPB Commonly known as: CANCIDAS Inject 50 mg into the vein daily for 21 days. Indication:  Candidemia (Candida Glabrata) First Dose: Yes Last Day of Therapy:  March 10th, 2023 Labs - Once weekly:  CBC/D and BMP, Labs - Every other week:  ESR  and CRP Method of administration: Elastomeric Method of administration may be changed at the discretion of home infusion pharmacist based upon assessment of the patient and/or caregiver's ability to self-administer the medication ordered.   collagenase ointment Commonly known as: SANTYL Apply topically daily. To left leg   diazepam 5 MG tablet Commonly known as: VALIUM Take 5 mg by mouth every 8 (eight) hours as needed for anxiety.   furosemide 40 MG tablet Commonly known as: LASIX Take 40 mg by mouth daily.   glucose blood test strip Use as instructed   glucose monitoring kit monitoring kit 1 each by Does not apply route as needed for other.   HumaLOG KwikPen 100 UNIT/ML KwikPen Generic drug: insulin lispro 0-12 Units 3 (three) times daily. Inject subcutaneously twice daily as per sliding scale: if 70-150= 0 units; 151-200= 2 u; 201-250= 4 u; 251-300= 6 u; 301-350= 8 u; 351-400= 10 u; 401-450= 12u; anything greater than 400 call physician.   hydrOXYzine 50 MG capsule Commonly known as: VISTARIL Take 50 mg by mouth every 8 (eight) hours as needed for itching.   metoCLOPramide 5 MG tablet Commonly known as: REGLAN Take 1 tablet (5 mg total) by mouth every 6 (six) hours as needed for nausea.   metoprolol tartrate 25 MG tablet Commonly known as: LOPRESSOR Take 0.5 tablets (12.5 mg total) by mouth 2 (two) times daily.   oxyCODONE 15 MG immediate release tablet Commonly known as: ROXICODONE Take 1 tablet (15 mg total) by mouth every 6 (six) hours as needed for pain.   potassium chloride 10 MEQ tablet Commonly known as: KLOR-CON Take 10 mEq by mouth daily.   promethazine 25 MG tablet Commonly known as: PHENERGAN Take 25 mg by mouth 2 (two) times daily as needed for nausea/vomiting or nausea.   Semglee (yfgn) 100 UNIT/ML Solostar Pen Generic drug: insulin glargine Inject 60 Units into the skin at bedtime.   traZODone 150 MG tablet Commonly known as: DESYREL Take 150  mg by mouth at bedtime.   Xarelto 20 MG Tabs tablet Generic drug: rivaroxaban Take 20 mg by mouth every evening.               Discharge Care Instructions  (From admission, onward)           Start     Ordered   09/13/21 0000  Change dressing on IV access line weekly and PRN  (Home infusion instructions - [facility] )        09/13/21 1152            Follow-up Information     [facility] Follow up.   Why: [facility] will provide home IV antibiotics.        Franchot Gallo, MD. Schedule an appointment as soon as possible for a visit.  Specialty: Urology Contact information: [place] [address] [phone]                 Discharge Exam: Danley Danker Weights   09/03/21 0538  Weight: (!) 191.4 kg   General-appears in no acute distress Heart-S1-S2, regular, no murmur auscultated Lungs-clear to auscultation bilaterally, no wheezing or crackles auscultated Abdomen-soft, nontender, no organomegaly Extremities-no edema in the lower extremities Neuro-alert, oriented x3, no focal deficit noted  Condition at discharge: good  The results of significant diagnostics from this hospitalization (including imaging, microbiology, ancillary and laboratory) are listed below for reference.   Imaging Studies: CT ABDOMEN PELVIS W CONTRAST  Result Date: 09/03/2021 CLINICAL DATA:  Abdominal pain for 4 days. EXAM: CT ABDOMEN AND PELVIS WITH CONTRAST TECHNIQUE: Multidetector CT imaging of the abdomen and pelvis was performed using the standard protocol following bolus administration of intravenous contrast. RADIATION DOSE REDUCTION: This exam was performed according to the departmental dose-optimization program which includes automated exposure control, adjustment of the mA and/or kV according to patient size and/or use of iterative reconstruction technique. CONTRAST:  146m OMNIPAQUE IOHEXOL 300 MG/ML  SOLN COMPARISON:  CT 11/10/2017 FINDINGS: Lower  chest: Coronary artery atherosclerosis.  No acute findings. Hepatobiliary: Hepatomegaly. Liver measures approximately 27 cm in length. No focal liver lesion identified. There are a few tiny layering stones within the gallbladder. No appreciable pericholecystic inflammatory changes by CT. Pancreas: Unremarkable. No pancreatic ductal dilatation or surrounding inflammatory changes. Spleen: Spleen is enlarged measuring approximately 14 cm in length. Scattered punctate calcified granulomas within the spleen. Adrenals/Urinary Tract: Unremarkable adrenal glands. Chronically obstructing 1.3 x 1.0 cm stone at the right ureteropelvic junction resulting in severe right-sided hydronephrosis. There is air within the renal pelvis and intrarenal calices. Extensive right-sided urothelial enhancement. There is a complex intraparenchymal fluid and air collection in the lower pole of the right kidney measuring approximately 4.4 x 3.4 x 3.7 cm (series 2, image 59). Extensive right-sided perinephric stranding. Small calcification within the upper pole of the left kidney, likely vascular. Left kidney is otherwise unremarkable. No hydronephrosis. There is a small amount of air within the nondependent portion of the urinary bladder lumen. Stomach/Bowel: Stomach is within normal limits. No evidence of bowel wall thickening, distention, or inflammatory changes. Vascular/Lymphatic: Extensive age advanced atherosclerotic calcifications throughout the aortoiliac axis. Multiple borderline enlarged retroperitoneal lymph nodes, likely reactive. Reproductive: No acute findings. Other: No ascites.  No pneumoperitoneum. Musculoskeletal: No acute osseous findings. Degenerative spondylosis with grade 1 anterolisthesis of L5 on S1. IMPRESSION: 1. Chronically obstructing 1.3 x 1.0 cm stone at the right ureteropelvic junction resulting in severe right-sided hydronephrosis. There is extensive urothelial enhancement as well as air within the right renal  pelvis and intrarenal calices compatible with infection. There is a complex intraparenchymal fluid and air collection in the lower pole of the right kidney measuring up to 4.4 cm, suspicious for developing intrarenal abscess. Urology consultation is recommended. 2. Small amount of air within the non-dependent portion of the urinary bladder lumen, which may be secondary to recent instrumentation versus infection. 3. Hepatosplenomegaly. 4. Cholelithiasis without evidence of acute cholecystitis. 5. Aortic atherosclerosis, age advanced (ICD10-I70.0). These results were called by telephone at the time of interpretation on 09/03/2021 at 2:28 pm to provider MADISON REDWINE, PA, who verbally acknowledged these results. Electronically Signed   By: NDavina PokeD.O.   On: 09/03/2021 14:30   [place]NAL  Result Date: 09/06/2021 CLINICAL DATA:  Renal failure. EXAM: RENAL / URINARY TRACT ULTRASOUND  COMPLETE COMPARISON:  09/03/2021. FINDINGS: Right Kidney: Renal measurements: 10.7 x 5.0 x 5.2 cm = volume: 147.2 mL. Slightly increased parenchymal echogenicity. There is a cyst in the lower pole measuring 4.6 x 3.5 x 2.8 cm. A 7 mm shadowing calculus is noted in the midpole. No mass or hydronephrosis visualized. Left Kidney: Renal measurements: 16.4 x 7.1 x 8.0 cm = volume: 488.9 mL. Slightly increased parenchymal echogenicity. No mass or hydronephrosis visualized. Bladder: The bladder is nondistended and a Foley catheter is in place. Other: None. IMPRESSION: 1. Slightly increased parenchymal echogenicity bilaterally which may be associated with medical renal disease. 2. Asymmetrically enlarged left kidney measuring 16.4 cm in length. 3. Right renal cyst and calculus. Electronically Signed   By: Brett Fairy M.D.   On: 09/06/2021 21:40   IR Fluoro Guide CV Line Right  Result Date: 09/13/2021 INDICATION: Poor IV access EXAM: ULTRASOUND AND FLUOROSCOPIC GUIDED PLACEMENT OF TUNNELED CENTRAL VENOUS CATHETER MEDICATIONS: None.  ANESTHESIA/SEDATION: Local anesthetic sedation was employed during this procedure. The patient's level of consciousness and vital signs were monitored continuously by radiology nursing throughout the procedure under my direct supervision. FLUOROSCOPY TIME:  0 minutes, 36 seconds (7 mGy) COMPLICATIONS: None immediate. PROCEDURE: Informed written consent was obtained from the patient and/or patient's representative after a discussion of the risks, benefits, and alternatives to treatment. Questions regarding the procedure were encouraged and answered. The RIGHT neck and chest were prepped with chlorhexidine in a sterile fashion, and a sterile drape was applied covering the operative field. Maximum barrier sterile technique with sterile gowns and gloves were used for the procedure. A timeout was performed prior to the initiation of the procedure. After the overlying soft tissues were anesthetized, a small venotomy incision was created and a micropuncture kit was utilized to access the internal jugular vein. Real-time ultrasound guidance was utilized for vascular access including the acquisition of a permanent ultrasound image documenting patency of the accessed vessel. The microwire was utilized to measure appropriate catheter length. The micropuncture sheath was exchanged for a peel-away sheath over a guidewire. A 4 Fr single lumen tunneled central venous catheter measuring 25 cm was tunneled in a retrograde fashion from the anterior chest wall to the venotomy incision. The catheter was then placed through the peel-away sheath with tip ultimately positioned at the superior caval-atrial junction. Final catheter positioning was confirmed and documented with a spot radiographic image. The catheter aspirates and flushes normally. The catheter was flushed with appropriate volume heparin dwells. The catheter exit site was secured with a 2-0 Ethilon retention suture. The venotomy incision was closed with Dermabond. Dressings  were applied. The patient tolerated the procedure well without immediate post procedural complication. FINDINGS: After catheter placement, the tip lies within the superior cavoatrial junction. The catheter aspirates and flushes normally and is ready for immediate use. IMPRESSION: Successful placement of 25 cm single lumen tunneled central venous (Powerline) catheter via the RIGHT internal jugular vein The tip of the catheter is positioned within the superior cavoatrial junction. The catheter is ready for immediate use. Michaelle Birks, MD [facility] Radiology Electronically Signed   By: Michaelle Birks M.D.   On: 09/13/2021 08:12   IR US Guide Vasc Access Right  Result Date: 09/13/2021 INDICATION: Poor IV access EXAM: ULTRASOUND AND FLUOROSCOPIC GUIDED PLACEMENT OF TUNNELED CENTRAL VENOUS CATHETER MEDICATIONS: None. ANESTHESIA/SEDATION: Local anesthetic sedation was employed during this procedure. The patient's level of consciousness and vital signs were monitored continuously by radiology nursing throughout the  procedure under my direct supervision. FLUOROSCOPY TIME:  0 minutes, 36 seconds (7 mGy) COMPLICATIONS: None immediate. PROCEDURE: Informed written consent was obtained from the patient and/or patient's representative after a discussion of the risks, benefits, and alternatives to treatment. Questions regarding the procedure were encouraged and answered. The RIGHT neck and chest were prepped with chlorhexidine in a sterile fashion, and a sterile drape was applied covering the operative field. Maximum barrier sterile technique with sterile gowns and gloves were used for the procedure. A timeout was performed prior to the initiation of the procedure. After the overlying soft tissues were anesthetized, a small venotomy incision was created and a micropuncture kit was utilized to access the internal jugular vein. Real-time ultrasound guidance was utilized for vascular  access including the acquisition of a permanent ultrasound image documenting patency of the accessed vessel. The microwire was utilized to measure appropriate catheter length. The micropuncture sheath was exchanged for a peel-away sheath over a guidewire. A 4 Fr single lumen tunneled central venous catheter measuring 25 cm was tunneled in a retrograde fashion from the anterior chest wall to the venotomy incision. The catheter was then placed through the peel-away sheath with tip ultimately positioned at the superior caval-atrial junction. Final catheter positioning was confirmed and documented with a spot radiographic image. The catheter aspirates and flushes normally. The catheter was flushed with appropriate volume heparin dwells. The catheter exit site was secured with a 2-0 Ethilon retention suture. The venotomy incision was closed with Dermabond. Dressings were applied. The patient tolerated the procedure well without immediate post procedural complication. FINDINGS: After catheter placement, the tip lies within the superior cavoatrial junction. The catheter aspirates and flushes normally and is ready for immediate use. IMPRESSION: Successful placement of 25 cm single lumen tunneled central venous (Powerline) catheter via the RIGHT internal jugular vein The tip of the catheter is positioned within the superior cavoatrial junction. The catheter is ready for immediate use. Michaelle Birks, MD [facility] Radiology Electronically Signed   By: Michaelle Birks M.D.   On: 09/13/2021 08:12   DG Chest Portable 1 View  Result Date: 09/03/2021 CLINICAL DATA:  46 year old female with history of abdominal pain, nausea, vomiting and diarrhea. EXAM: PORTABLE CHEST 1 VIEW COMPARISON:  Chest x-ray 02/01/2021. FINDINGS: Lung volumes are normal. No consolidative airspace disease. No pleural effusions. No pneumothorax. No pulmonary nodule or mass noted. Pulmonary vasculature and the  cardiomediastinal silhouette are within normal limits. IMPRESSION: No radiographic evidence of acute cardiopulmonary disease. Electronically Signed   By: Vinnie Langton M.D.   On: 09/03/2021 07:08   DG C-Arm 1-60 Min-No Report  Result Date: 09/03/2021 Fluoroscopy was utilized by the requesting physician.  No radiographic interpretation.   DG C-Arm 1-60 Min-No Report  Result Date: 09/03/2021 Fluoroscopy was utilized by the requesting physician.  No radiographic interpretation.   ECHOCARDIOGRAM COMPLETE  Result Date: 09/06/2021    ECHOCARDIOGRAM REPORT   Patient Name:   Karen Bennett Date of Exam: 09/06/2021 Medical Rec #:  [id number]       Height:       66.0 in Accession #:    [id number]      Weight:       422.0 lb Date of Birth:  1975-12-28      BSA:          2.747 m Patient Age:    19 years        BP:  132/71 mmHg Patient Gender: F               HR:           76 bpm. Exam Location:  Inpatient Procedure: 2D Echo, Cardiac Doppler, Color Doppler and Intracardiac            Opacification Agent Indications:    Bacteremia  History:        Patient has prior history of Echocardiogram examinations, most                 recent 02/05/2021. Signs/Symptoms:Bacteremia; Risk                 Factors:Diabetes.  Sonographer:    Luisa Hart RDCS Referring Phys: [place]  Sonographer Comments: Technically difficult study due to poor echo windows and patient is morbidly obese. Image acquisition challenging due to patient body habitus and Image acquisition challenging due to uncooperative patient. IMPRESSIONS  1. Poor quality study.  2. Left ventricular ejection fraction, by estimation, is 50 to 55%. The left ventricle has low normal function. Left ventricular endocardial border not optimally defined to evaluate regional wall motion. Left ventricular diastolic parameters are consistent with Grade I diastolic dysfunction (impaired relaxation).  3. Right ventricular systolic function is normal. The right  ventricular size is normal.  4. The mitral valve is grossly normal. Trivial mitral valve regurgitation. No evidence of mitral stenosis.  5. The aortic valve is grossly normal. Aortic valve regurgitation is not visualized. No aortic stenosis is present. FINDINGS  Left Ventricle: Left ventricular ejection fraction, by estimation, is 50 to 55%. The left ventricle has low normal function. Left ventricular endocardial border not optimally defined to evaluate regional wall motion. Definity contrast agent was given IV  to delineate the left ventricular endocardial borders. The left ventricular internal cavity size was normal in size. There is no left ventricular hypertrophy. Abnormal (paradoxical) septal motion, consistent with left bundle branch block. Left ventricular diastolic parameters are consistent with Grade I diastolic dysfunction (impaired relaxation). Right Ventricle: The right ventricular size is normal. No increase in right ventricular wall thickness. Right ventricular systolic function is normal. Left Atrium: Left atrial size was normal in size. Right Atrium: Right atrial size was normal in size. Pericardium: There is no evidence of pericardial effusion. Mitral Valve: The mitral valve is grossly normal. Trivial mitral valve regurgitation. No evidence of mitral valve stenosis. MV peak gradient, 5.2 mmHg. The mean mitral valve gradient is 3.0 mmHg. Tricuspid Valve: The tricuspid valve is grossly normal. Tricuspid valve regurgitation is mild . No evidence of tricuspid stenosis. Aortic Valve: The aortic valve is grossly normal. Aortic valve regurgitation is not visualized. No aortic stenosis is present. Aortic valve mean gradient measures 5.0 mmHg. Aortic valve peak gradient measures 9.0 mmHg. Pulmonic Valve: The pulmonic valve was grossly normal. Pulmonic valve regurgitation is not visualized. No evidence of pulmonic stenosis. Aorta: The aortic root and ascending aorta are structurally normal, with no evidence  of dilitation. Venous: The inferior vena cava was not well visualized. IAS/Shunts: The atrial septum is grossly normal.  LEFT VENTRICLE PLAX 2D LVOT diam:     1.90 cm   Diastology LVOT Area:     2.84 cm  LV e' medial:    5.11 cm/s                          LV E/e' medial:  22.9  LV e' lateral:   9.57 cm/s                          LV E/e' lateral: 12.2  RIGHT VENTRICLE RV Basal diam:  5.20 cm RV Mid diam:    4.00 cm RV S prime:     9.36 cm/s TAPSE (M-mode): 1.9 cm LEFT ATRIUM           Index        RIGHT ATRIUM           Index LA Vol (A4C): 42.5 ml 15.47 ml/m  RA Area:     15.10 cm                                    RA Volume:   39.40 ml  14.34 ml/m  AORTIC VALVE              PULMONIC VALVE AV Vmax:      150.00 cm/s PV Vmax:       1.27 m/s AV Vmean:     99.300 cm/s PV Vmean:      86.200 cm/s AV VTI:       0.254 m     PV VTI:        0.258 m AV Peak Grad: 9.0 mmHg    PV Peak grad:  6.5 mmHg AV Mean Grad: 5.0 mmHg    PV Mean grad:  3.5 mmHg  AORTA Ao Root diam: 2.70 cm Ao Asc diam:  2.70 cm MITRAL VALVE                TRICUSPID VALVE MV Area (PHT): 3.42 cm     TR Peak grad:   33.9 mmHg MV Peak grad:  5.2 mmHg     TR Vmax:        291.00 cm/s MV Mean grad:  3.0 mmHg MV Vmax:       1.14 m/s     SHUNTS MV Vmean:      78.0 cm/s    Systemic Diam: 1.90 cm MV Decel Time: 222 msec MR Peak grad: 66.9 mmHg MR Vmax:      409.00 cm/s MV E velocity: 117.00 cm/s MV A velocity: 133.00 cm/s MV E/A ratio:  0.88 Eleonore Chiquito MD Electronically signed by Eleonore Chiquito MD Signature Date/Time: 09/06/2021/3:37:44 PM    Final    [place] EKG SITE RITE  Result Date: 09/12/2021 If Site Rite image not attached, placement could not be confirmed due to current cardiac rhythm.  [place] EKG SITE RITE  Result Date: 09/09/2021 If Site Rite image not attached, placement could not be confirmed due to current cardiac rhythm.  [place] EKG SITE RITE  Result Date: 09/03/2021 If Site Rite image not attached, placement could not be  confirmed due to current cardiac rhythm.   Microbiology: Results for orders placed or performed during the hospital encounter of 09/03/21  Resp Panel by RT-PCR (Flu A&B, Covid) Nasopharyngeal Swab     Status: None   Collection Time: 09/03/21  5:57 AM   Specimen: Nasopharyngeal Swab; Nasopharyngeal(NP) swabs in vial transport medium  Result Value Ref Range Status   SARS Coronavirus 2 by RT PCR NEGATIVE NEGATIVE Final    Comment: (NOTE) SARS-CoV-2 target nucleic acids are NOT DETECTED.  The SARS-CoV-2 RNA is generally detectable in upper respiratory specimens during the acute phase of infection. The lowest concentration  of SARS-CoV-2 viral copies this assay can detect is 138 copies/mL. A negative result does not preclude SARS-Cov-2 infection and should not be used as the sole basis for treatment or other patient management decisions. A negative result may occur with  improper specimen collection/handling, submission of specimen other than nasopharyngeal swab, presence of viral mutation(s) within the areas targeted by this assay, and inadequate number of viral copies(<138 copies/mL). A negative result must be combined with clinical observations, patient history, and epidemiological information. The expected result is Negative.  Fact Sheet for Patients:  EntrepreneurPulse.com.au  Fact Sheet for Healthcare Providers:  IncredibleEmployment.be  This test is no t yet approved or cleared by the [place] FDA and  has been authorized for detection and/or diagnosis of SARS-CoV-2 by FDA under an Emergency Use Authorization (EUA). This EUA will remain  in effect (meaning this test can be used) for the duration of the COVID-19 declaration under Section 564(b)(1) of the Act, 21 U.S.C.section 360bbb-3(b)(1), unless the authorization is terminated  or revoked sooner.       Influenza A by PCR NEGATIVE NEGATIVE Final   Influenza B by PCR NEGATIVE NEGATIVE  Final    Comment: (NOTE) The Xpert Xpress SARS-CoV-2/FLU/RSV plus assay is intended as an aid in the diagnosis of influenza from Nasopharyngeal swab specimens and should not be used as a sole basis for treatment. Nasal washings and aspirates are unacceptable for Xpert Xpress SARS-CoV-2/FLU/RSV testing.  Fact Sheet for Patients: EntrepreneurPulse.com.au  Fact Sheet for Healthcare Providers: IncredibleEmployment.be  This test is not yet approved or cleared by the [place] FDA and has been authorized for detection and/or diagnosis of SARS-CoV-2 by FDA under an Emergency Use Authorization (EUA). This EUA will remain in effect (meaning this test can be used) for the duration of the COVID-19 declaration under Section 564(b)(1) of the Act, 21 U.S.C. section 360bbb-3(b)(1), unless the authorization is terminated or revoked.  Performed at [facility], [place] [address], [place], [place] [address]   Blood culture (routine x 2)     Status: Abnormal (Preliminary result)   Collection Time: 09/03/21  1:04 PM   Specimen: BLOOD  Result Value Ref Range Status   Specimen Description   Final    BLOOD BLOOD RIGHT FOREARM Performed at [place] [address] [place], [place] [address]    Special Requests   Final    BOTTLES DRAWN AEROBIC AND ANAEROBIC Blood Culture adequate volume Performed at [facility] [address], [place], [place] [address]    Culture  Setup Time   Final    YEAST IN BOTH AEROBIC AND ANAEROBIC BOTTLES CRITICAL RESULT CALLED TO, READ BACK BY AND VERIFIED WITH: PHARMD MICHELLE L. 09/06/21'@2' :05 BY TW    Culture (A)  Final    CANDIDA GLABRATA Sent to [place] for further susceptibility testing. Performed at [facility], [place] [address] [place], [place] [address]    Report Status PENDING  Incomplete  Blood culture (routine x 2)     Status: Abnormal (Preliminary  result)   Collection Time: 09/03/21  1:04 PM   Specimen: BLOOD  Result Value Ref Range Status   Specimen Description   Final    BLOOD LEFT ANTECUBITAL Performed at [facility] [address], [place], [place] [address]    Special Requests   Final    BOTTLES DRAWN AEROBIC AND ANAEROBIC Blood Culture adequate volume Performed at [facility] [address] [place],  [place] [address]    Culture  Setup Time   Final    YEAST IN BOTH AEROBIC AND ANAEROBIC BOTTLES CRITICAL VALUE NOTED.  VALUE IS CONSISTENT WITH PREVIOUSLY REPORTED AND CALLED VALUE. Performed at [facility], [place] [address], [place], [place] [address]    Culture CANDIDA GLABRATA (A)  Final   Report Status PENDING  Incomplete  Blood Culture ID Panel (Reflexed)     Status: Abnormal   Collection Time: 09/03/21  1:04 PM  Result Value Ref Range Status   Enterococcus faecalis NOT DETECTED NOT DETECTED Final   Enterococcus Faecium NOT DETECTED NOT DETECTED Final   Listeria monocytogenes NOT DETECTED NOT DETECTED Final   Staphylococcus species NOT DETECTED NOT DETECTED Final   Staphylococcus aureus (BCID) NOT DETECTED NOT DETECTED Final   Staphylococcus epidermidis NOT DETECTED NOT DETECTED Final   Staphylococcus lugdunensis NOT DETECTED NOT DETECTED Final   Streptococcus species NOT DETECTED NOT DETECTED Final   Streptococcus agalactiae NOT DETECTED NOT DETECTED Final   Streptococcus pneumoniae NOT DETECTED NOT DETECTED Final   Streptococcus pyogenes NOT DETECTED NOT DETECTED Final   A.calcoaceticus-baumannii NOT DETECTED NOT DETECTED Final   Bacteroides fragilis NOT DETECTED NOT DETECTED Final   Enterobacterales NOT DETECTED NOT DETECTED Final   Enterobacter cloacae complex NOT DETECTED NOT DETECTED Final   Escherichia coli NOT DETECTED NOT DETECTED Final   Klebsiella aerogenes NOT DETECTED NOT DETECTED Final   Klebsiella oxytoca NOT DETECTED NOT DETECTED Final   Klebsiella  pneumoniae NOT DETECTED NOT DETECTED Final   Proteus species NOT DETECTED NOT DETECTED Final   Salmonella species NOT DETECTED NOT DETECTED Final   Serratia marcescens NOT DETECTED NOT DETECTED Final   Haemophilus influenzae NOT DETECTED NOT DETECTED Final   Neisseria meningitidis NOT DETECTED NOT DETECTED Final   Pseudomonas aeruginosa NOT DETECTED NOT DETECTED Final   Stenotrophomonas maltophilia NOT DETECTED NOT DETECTED Final   Candida albicans NOT DETECTED NOT DETECTED Final   Candida auris NOT DETECTED NOT DETECTED Final   Candida glabrata DETECTED (A) NOT DETECTED Final    Comment: CRITICAL RESULT CALLED TO, READ BACK BY AND VERIFIED WITH: PHARMD MICHELLE L. 09/06/21'@2' :05 BY TW    Candida krusei NOT DETECTED NOT DETECTED Final   Candida parapsilosis NOT DETECTED NOT DETECTED Final   Candida tropicalis NOT DETECTED NOT DETECTED Final   Cryptococcus neoformans/gattii NOT DETECTED NOT DETECTED Final    Comment: Performed at [facility], [address], [place], [place] [address]  Antifungal AST 9 Drug Panel     Status: None   Collection Time: 09/03/21  3:57 PM  Result Value Ref Range Status   Organism ID, Yeast Candida glabrata  Corrected    Comment: (NOTE) Identification performed by account, not confirmed by this laboratory. CORRECTED ON 02/18 AT 1235: PREVIOUSLY REPORTED AS Preliminary report    Amphotericin B MIC 1.0 ug/mL  Final    Comment: (NOTE) Breakpoints have been established for only some organism-drug combinations as indicated. This test was developed and its performance characteristics determined by [facility]. It has not been cleared or approved by the Food and Drug Administration.    Anidulafungin MIC Comment  Final    Comment: (NOTE) 0.03 ug/mL Susceptible Breakpoints have been established for only some organism-drug combinations as indicated. This test was developed and its performance characteristics determined by [facility]. It has not been cleared or  approved by the Food and Drug Administration.    Caspofungin MIC Comment  Final    Comment: (NOTE)  0.06 ug/mL Susceptible Breakpoints have been established for only some organism-drug combinations as indicated. This test was developed and its performance characteristics determined by [facility]. It has not been cleared or approved by the Food and Drug Administration.    Micafungin MIC Comment  Final    Comment: (NOTE) 0.03 ug/mL Susceptible Breakpoints have been established for only some organism-drug combinations as indicated. This test was developed and its performance characteristics determined by [facility]. It has not been cleared or approved by the Food and Drug Administration.    Posaconazole MIC 2.0 ug/mL  Final    Comment: (NOTE) Breakpoints have been established for only some organism-drug combinations as indicated. This test was developed and its performance characteristics determined by [facility]. It has not been cleared or approved by the Food and Drug Administration.    Fluconazole Islt MIC 16.0 ug/mL  Final    Comment: (NOTE) Susceptible Dose Dependent Breakpoints have been established for only some organism-drug combinations as indicated. This test was developed and its performance characteristics determined by [facility]. It has not been cleared or approved by the Food and Drug Administration.    Flucytosine MIC 0.06 ug/mL or less  Final    Comment: (NOTE) Breakpoints have been established for only some organism-drug combinations as indicated. This test was developed and its performance characteristics determined by [facility]. It has not been cleared or approved by the Food and Drug Administration.    Itraconazole MIC 1.0 ug/mL  Final    Comment: (NOTE) Breakpoints have been established for only some organism-drug combinations as indicated. This test was developed and its performance characteristics determined by [facility]. It has not been cleared or approved by  the Food and Drug Administration.    Voriconazole MIC 0.5 ug/mL  Final    Comment: (NOTE) Breakpoints have been established for only some organism-drug combinations as indicated. This test was developed and its performance characteristics determined by [facility]. It has not been cleared or approved by the Food and Drug Administration. Performed At: [facility] [place], [place] [id number] Rush Farmer MD GQ:[id number]    Source BLOOD  Corrected    Comment: Performed at [facility], [place] [address], [place], [place] [address] CORRECTED ON 02/15 AT 0919: PREVIOUSLY REPORTED AS [id number]   MRSA Next Gen by PCR, Nasal     Status: None   Collection Time: 09/03/21  6:45 PM   Specimen: Nasal Mucosa; Nasal Swab  Result Value Ref Range Status   MRSA by PCR Next Gen NOT DETECTED NOT DETECTED Final    Comment: (NOTE) The GeneXpert MRSA Assay (FDA approved for NASAL specimens only), is one component of a comprehensive MRSA colonization surveillance program. It is not intended to diagnose MRSA infection nor to guide or monitor treatment for MRSA infections. Test performance is not FDA approved in patients less than 5 years old. Performed at [facility], [place] [address], [place], [place] [address]   Urine Culture     Status: None   Collection Time: 09/04/21 10:52 AM   Specimen: Urine, Clean Catch  Result Value Ref Range Status   Specimen Description   Final    URINE, CLEAN CATCH Performed at [facility], [place], [place] [address], [place], [place] [address]    Special Requests   Final    NONE Performed at [facility], [place] [address] [place],  [address]    Culture   Final    NO GROWTH Performed at [facility], [place] [address] [place], [place] [address]    Report Status 09/05/2021 FINAL  Final  Culture, blood (routine x 2)     Status: None   Collection Time: 09/07/21 12:20 PM   Specimen: BLOOD   Result Value Ref Range Status   Specimen Description   Final    BLOOD RIGHT ANTECUBITAL Performed at [facility] [address] [place], [place] [address]    Special Requests   Final    BOTTLES DRAWN AEROBIC AND ANAEROBIC Blood Culture adequate volume Performed at [place] [address], [place], [place] [address]    Culture   Final    NO GROWTH 5 DAYS Performed at [facility], [place] [name], [place], [place] [address]    Report Status 09/12/2021 FINAL  Final  Culture, blood (routine x 2)     Status: None   Collection Time: 09/07/21  2:08 PM   Specimen: BLOOD  Result Value Ref Range Status   Specimen Description   Final    BLOOD LEFT ANTECUBITAL Performed at [facility] [address] [place], [place] [address]    Special Requests   Final    BOTTLES DRAWN AEROBIC ONLY Blood Culture adequate volume Performed at [facility] [address], [place], [place] [address]    Culture   Final    NO GROWTH 5 DAYS Performed at [facility], [place] [address], [place], [place] [address]    Report Status 09/12/2021 FINAL  Final    Labs: CBC: Recent Labs  Lab 09/07/21 0715 09/08/21 0405  WBC 6.3 7.6  HGB 9.8* 9.0*  HCT 29.9* 27.9*  MCV 84.7 86.6  PLT 249 166   Basic Metabolic Panel: Recent Labs  Lab 09/07/21 0715 09/08/21 0405 09/09/21 0404 09/10/21 0541 09/11/21 1206  NA 122* 127* 129* 130* 130*  K 3.6 3.9 3.8 3.7 3.7  CL 95* 101 103 105 107  CO2 20* 19* 20* 20* 20*  GLUCOSE 132* 118* 174* 168* 132*  BUN 43* 40* 37* 36* 31*  CREATININE 2.21* 1.85* 1.71* 1.65* 1.45*  CALCIUM 7.6* 7.8* 7.8* 8.0* 8.1*  MG 2.0  --   --   --   --   PHOS 4.5  --   --   --   --    Liver Function Tests: Recent Labs  Lab 09/08/21 0405  AST 11*  ALT 11  ALKPHOS 70  BILITOT 0.3  PROT 5.9*  ALBUMIN <1.5*   CBG: Recent Labs  Lab 09/12/21 1135 09/12/21 1630 09/12/21 2235  09/13/21 0740 09/13/21 1142  GLUCAP 113* 92 127* 214* 106*    Discharge time spent: greater than 30 minutes.  Signed: Oswald Hillock, MD [facility] 09/13/2021

## 2021-09-13 NOTE — TOC Transition Note (Signed)
Transition of Care [facility]) - CM/SW Discharge Note  Patient Details  Name: Karen Bennett MRN: [id number] Date of Birth: 1975-10-29  [facility] CM/SW Contact:  Ewing Schlein, LCSW Phone Number: 09/13/2021, 1:02 PM  Clinical Narrative: CSW updated by Elita Quick with [employer] that training has been completed and nursing will meet with patient tomorrow. Medical necessity form done; PTAR scheduled. TOC signing off.  Final next level of care: Home w Home Health Services Barriers to Discharge: Barriers Resolved  Patient Goals and CMS Choice Patient states their goals for this hospitalization and ongoing recovery are:: Return home CMS Medicare.gov Compare Post Acute Care list provided to:: Patient Choice offered to / list presented to : Patient  Discharge Plan and Services In-house Referral: Clinical Social Work Post Acute Care Choice: Home Health          DME Arranged: N/A DME Agency: NA HH Arranged: RN, IV Antibiotics HH Agency: [employer], [employer] Date HH Agency Contacted: 09/07/21 Time HH Agency Contacted: 1433 Representative spoke with at [facility] Agency: Pam  Readmission Risk Interventions Readmission Risk Prevention Plan 09/12/2021 09/07/2021 01/05/2021  Transportation Screening - Complete Complete  PCP or Specialist Appt within 5-7 Days - - Complete  Home Care Screening - - Complete  Medication Review (RN Care Manager) Complete - -  HRI or Home Care Consult - Complete -  SW Recovery Care/Counseling Consult - Complete -  Palliative Care Screening - Not Applicable -  Skilled Nursing Facility Not Applicable - -  Some recent data might be hidden

## 2021-09-14 LAB — CULTURE, BLOOD (ROUTINE X 2)
Special Requests: ADEQUATE
Special Requests: ADEQUATE

## 2021-09-28 ENCOUNTER — Inpatient Hospital Stay: Payer: Medicare Other | Admitting: Internal Medicine

## 2021-10-05 ENCOUNTER — Inpatient Hospital Stay: Payer: Medicare Other | Admitting: Internal Medicine

## 2021-10-05 ENCOUNTER — Telehealth: Payer: Self-pay

## 2021-10-05 ENCOUNTER — Other Ambulatory Visit: Payer: Self-pay

## 2021-10-05 DIAGNOSIS — B376 Candidal endocarditis: Secondary | ICD-10-CM

## 2021-10-05 DIAGNOSIS — T80211D Bloodstream infection due to central venous catheter, subsequent encounter: Secondary | ICD-10-CM

## 2021-10-05 NOTE — Telephone Encounter (Addendum)
I spoke to the patient to advise her she did not need to follow up with our office today per Dr. Renold Don.  ?Patient scheduled with [facility] IR to have tunneled CVC removed 10/10/21 at 11:30 am. I attempted to contact patient to give her the appointment. Patient did not answer and voicemail was left for her to call back.  ?[name] ? ?

## 2021-10-06 NOTE — Telephone Encounter (Signed)
Thank you Diminique!

## 2021-10-06 NOTE — Telephone Encounter (Signed)
Patient returned call and is aware of appointment

## 2021-10-06 NOTE — Telephone Encounter (Signed)
Patient called back stating she is needing different appointment due to transportation needing to know about her appointment 3 days in advance. [name] with IR has been rescheduled to 10/12/21 @ 12pm and arrival time 11:30 am. I attempted to call patient back to give her new appointment information. Patient did not answer and I left a message on a secured voicemail with all appointment information.  ?Karen Bennett T Roe Wilner ? ?

## 2021-10-10 ENCOUNTER — Ambulatory Visit (HOSPITAL_COMMUNITY): Payer: Medicare Other

## 2021-10-12 ENCOUNTER — Ambulatory Visit (HOSPITAL_COMMUNITY): Admission: RE | Admit: 2021-10-12 | Payer: Medicare Other | Source: Ambulatory Visit

## 2021-10-20 ENCOUNTER — Ambulatory Visit (HOSPITAL_COMMUNITY): Admission: RE | Admit: 2021-10-20 | Payer: Medicare Other | Source: Ambulatory Visit

## 2021-10-20 NOTE — Telephone Encounter (Signed)
Please see below Dr. Renold Don. ?Neville Pauls T Mekhai Venuto ? ?

## 2021-10-20 NOTE — Telephone Encounter (Signed)
I received a message from [place] at [employer] stating patient has missed to appointments to get her port removed.  ? ?I attempted to contact the patient to follow up with her regarding her missed appointments. Patient did not answer and I left a message for her to call back on a secured voicemail. ?Franciszek Platten T Kenita Bines ? ?

## 2021-10-24 ENCOUNTER — Other Ambulatory Visit: Payer: Self-pay

## 2021-10-24 ENCOUNTER — Observation Stay (HOSPITAL_COMMUNITY)
Admission: EM | Admit: 2021-10-24 | Discharge: 2021-10-25 | Disposition: A | Discharge status: Home Health | Payer: Medicare Other | Attending: Internal Medicine | Admitting: Internal Medicine

## 2021-10-24 ENCOUNTER — Encounter (HOSPITAL_COMMUNITY): Payer: Self-pay

## 2021-10-24 ENCOUNTER — Emergency Department (HOSPITAL_COMMUNITY): Payer: Medicare Other

## 2021-10-24 DIAGNOSIS — Z89511 Acquired absence of right leg below knee: Secondary | ICD-10-CM

## 2021-10-24 DIAGNOSIS — Z794 Long term (current) use of insulin: Secondary | ICD-10-CM | POA: Diagnosis not present

## 2021-10-24 DIAGNOSIS — Z86718 Personal history of other venous thrombosis and embolism: Secondary | ICD-10-CM | POA: Insufficient documentation

## 2021-10-24 DIAGNOSIS — R2689 Other abnormalities of gait and mobility: Secondary | ICD-10-CM | POA: Diagnosis not present

## 2021-10-24 DIAGNOSIS — E785 Hyperlipidemia, unspecified: Secondary | ICD-10-CM | POA: Diagnosis not present

## 2021-10-24 DIAGNOSIS — R8281 Pyuria: Secondary | ICD-10-CM | POA: Diagnosis present

## 2021-10-24 DIAGNOSIS — Y848 Other medical procedures as the cause of abnormal reaction of the patient, or of later complication, without mention of misadventure at the time of the procedure: Secondary | ICD-10-CM | POA: Diagnosis not present

## 2021-10-24 DIAGNOSIS — Z86711 Personal history of pulmonary embolism: Secondary | ICD-10-CM | POA: Diagnosis not present

## 2021-10-24 DIAGNOSIS — Z7901 Long term (current) use of anticoagulants: Secondary | ICD-10-CM | POA: Insufficient documentation

## 2021-10-24 DIAGNOSIS — I2699 Other pulmonary embolism without acute cor pulmonale: Secondary | ICD-10-CM | POA: Diagnosis present

## 2021-10-24 DIAGNOSIS — I10 Essential (primary) hypertension: Secondary | ICD-10-CM | POA: Diagnosis not present

## 2021-10-24 DIAGNOSIS — R739 Hyperglycemia, unspecified: Secondary | ICD-10-CM | POA: Diagnosis not present

## 2021-10-24 DIAGNOSIS — Z8679 Personal history of other diseases of the circulatory system: Secondary | ICD-10-CM | POA: Diagnosis not present

## 2021-10-24 DIAGNOSIS — T82848A Pain from vascular prosthetic devices, implants and grafts, initial encounter: Secondary | ICD-10-CM | POA: Diagnosis not present

## 2021-10-24 DIAGNOSIS — D649 Anemia, unspecified: Secondary | ICD-10-CM | POA: Diagnosis present

## 2021-10-24 DIAGNOSIS — E039 Hypothyroidism, unspecified: Secondary | ICD-10-CM | POA: Insufficient documentation

## 2021-10-24 DIAGNOSIS — E871 Hypo-osmolality and hyponatremia: Secondary | ICD-10-CM | POA: Insufficient documentation

## 2021-10-24 DIAGNOSIS — I2782 Chronic pulmonary embolism: Secondary | ICD-10-CM

## 2021-10-24 DIAGNOSIS — E1165 Type 2 diabetes mellitus with hyperglycemia: Secondary | ICD-10-CM | POA: Diagnosis not present

## 2021-10-24 DIAGNOSIS — Z79899 Other long term (current) drug therapy: Secondary | ICD-10-CM | POA: Diagnosis not present

## 2021-10-24 DIAGNOSIS — E1169 Type 2 diabetes mellitus with other specified complication: Secondary | ICD-10-CM | POA: Diagnosis present

## 2021-10-24 LAB — BETA-HYDROXYBUTYRIC ACID: Beta-Hydroxybutyric Acid: 0.19 mmol/L (ref 0.05–0.27)

## 2021-10-24 LAB — URINALYSIS, ROUTINE W REFLEX MICROSCOPIC
Bilirubin Urine: NEGATIVE
Glucose, UA: >=500 mg/dL — AB
Ketones, ur: NEGATIVE mg/dL
Nitrite: NEGATIVE
Protein, ur: >=300 mg/dL — AB
Specific Gravity, Urine: 1.021 (ref 1.005–1.030)
WBC, UA: >50 WBC/hpf — ABNORMAL HIGH (ref 0–5)
pH: 6 (ref 5.0–8.0)

## 2021-10-24 LAB — CBC WITH DIFFERENTIAL/PLATELET
Abs Immature Granulocytes: 0.08 10*3/uL — ABNORMAL HIGH (ref 0.00–0.07)
Abs Immature Granulocytes: 0.08 10*3/uL — ABNORMAL HIGH (ref 0.00–0.07)
Basophils Absolute: 0 10*3/uL (ref 0.0–0.1)
Basophils Absolute: 0 10*3/uL (ref 0.0–0.1)
Basophils Relative: 1 %
Basophils Relative: 1 %
Eosinophils Absolute: 0.1 10*3/uL (ref 0.0–0.5)
Eosinophils Absolute: 0.1 10*3/uL (ref 0.0–0.5)
Eosinophils Relative: 2 %
Eosinophils Relative: 2 %
HCT: 39.4 % (ref 36.0–46.0)
HCT: 36.4 % (ref 36.0–46.0)
Hemoglobin: 13.2 g/dL (ref 12.0–15.0)
Hemoglobin: 11.9 g/dL — ABNORMAL LOW (ref 12.0–15.0)
Immature Granulocytes: 1 %
Immature Granulocytes: 1 %
Lymphocytes Relative: 28 %
Lymphocytes Relative: 31 %
Lymphs Abs: 1.7 10*3/uL (ref 0.7–4.0)
Lymphs Abs: 2 10*3/uL (ref 0.7–4.0)
MCH: 28.5 pg (ref 26.0–34.0)
MCH: 27.9 pg (ref 26.0–34.0)
MCHC: 33.5 g/dL (ref 30.0–36.0)
MCHC: 32.7 g/dL (ref 30.0–36.0)
MCV: 85.1 fL (ref 80.0–100.0)
MCV: 85.4 fL (ref 80.0–100.0)
Monocytes Absolute: 0.5 10*3/uL (ref 0.1–1.0)
Monocytes Absolute: 0.4 10*3/uL (ref 0.1–1.0)
Monocytes Relative: 8 %
Monocytes Relative: 6 %
Neutro Abs: 3.6 10*3/uL (ref 1.7–7.7)
Neutro Abs: 3.9 10*3/uL (ref 1.7–7.7)
Neutrophils Relative %: 60 %
Neutrophils Relative %: 59 %
Platelets: 378 10*3/uL (ref 150–400)
Platelets: 325 10*3/uL (ref 150–400)
RBC: 4.63 MIL/uL (ref 3.87–5.11)
RBC: 4.26 MIL/uL (ref 3.87–5.11)
RDW: 13.1 % (ref 11.5–15.5)
RDW: 13.2 % (ref 11.5–15.5)
WBC: 6.1 10*3/uL (ref 4.0–10.5)
WBC: 6.5 10*3/uL (ref 4.0–10.5)
nRBC: 0 % (ref 0.0–0.2)
nRBC: 0 % (ref 0.0–0.2)

## 2021-10-24 LAB — COMPREHENSIVE METABOLIC PANEL
ALT: 8 U/L (ref 0–44)
AST: 10 U/L — ABNORMAL LOW (ref 15–41)
Albumin: 2 g/dL — ABNORMAL LOW (ref 3.5–5.0)
Alkaline Phosphatase: 91 U/L (ref 38–126)
Anion gap: 9 (ref 5–15)
BUN: 10 mg/dL (ref 6–20)
CO2: 23 mmol/L (ref 22–32)
Calcium: 8.6 mg/dL — ABNORMAL LOW (ref 8.9–10.3)
Chloride: 92 mmol/L — ABNORMAL LOW (ref 98–111)
Creatinine, Ser: 0.93 mg/dL (ref 0.44–1.00)
GFR, Estimated: >60 mL/min (ref >60)
Glucose, Bld: 666 mg/dL (ref 70–99)
Potassium: 4.3 mmol/L (ref 3.5–5.1)
Sodium: 124 mmol/L — ABNORMAL LOW (ref 135–145)
Total Bilirubin: 0.6 mg/dL (ref 0.3–1.2)
Total Protein: 7.8 g/dL (ref 6.5–8.1)

## 2021-10-24 LAB — CBG MONITORING, ED
Glucose-Capillary: >600 mg/dL (ref 70–99)
Glucose-Capillary: >600 mg/dL (ref 70–99)
Glucose-Capillary: 546 mg/dL (ref 70–99)
Glucose-Capillary: 423 mg/dL — ABNORMAL HIGH (ref 70–99)
Glucose-Capillary: 220 mg/dL — ABNORMAL HIGH (ref 70–99)

## 2021-10-24 LAB — PREGNANCY, URINE: Preg Test, Ur: NEGATIVE

## 2021-10-24 LAB — BASIC METABOLIC PANEL
Anion gap: 10 (ref 5–15)
BUN: 11 mg/dL (ref 6–20)
CO2: 21 mmol/L — ABNORMAL LOW (ref 22–32)
Calcium: 8.5 mg/dL — ABNORMAL LOW (ref 8.9–10.3)
Chloride: 96 mmol/L — ABNORMAL LOW (ref 98–111)
Creatinine, Ser: 0.93 mg/dL (ref 0.44–1.00)
GFR, Estimated: >60 mL/min (ref >60)
Glucose, Bld: 536 mg/dL (ref 70–99)
Potassium: 4 mmol/L (ref 3.5–5.1)
Sodium: 127 mmol/L — ABNORMAL LOW (ref 135–145)

## 2021-10-24 LAB — I-STAT BETA HCG BLOOD, ED (MC, WL, AP ONLY): I-stat hCG, quantitative: 6.4 m[IU]/mL — ABNORMAL HIGH (ref <5)

## 2021-10-24 LAB — I-STAT VENOUS BLOOD GAS, ED
Acid-Base Excess: 0 mmol/L (ref 0.0–2.0)
Bicarbonate: 27.1 mmol/L (ref 20.0–28.0)
Calcium, Ion: 1.18 mmol/L (ref 1.15–1.40)
HCT: 40 % (ref 36.0–46.0)
Hemoglobin: 13.6 g/dL (ref 12.0–15.0)
O2 Saturation: 67 %
Potassium: 4.8 mmol/L (ref 3.5–5.1)
Sodium: 125 mmol/L — ABNORMAL LOW (ref 135–145)
TCO2: 29 mmol/L (ref 22–32)
pCO2, Ven: 51.4 mmHg (ref 44–60)
pH, Ven: 7.33 (ref 7.25–7.43)
pO2, Ven: 38 mmHg (ref 32–45)

## 2021-10-24 LAB — OSMOLALITY: Osmolality: 300 mOsm/kg — ABNORMAL HIGH (ref 275–295)

## 2021-10-24 MED ORDER — OXYCODONE HCL 5 MG PO TABS: 15.0000 mg | ORAL_TABLET | Freq: Four times a day (QID) | ORAL | Status: DC | PRN

## 2021-10-24 MED ORDER — LACTATED RINGERS IV BOLUS
20.0000 mL/kg | Freq: Once | INTRAVENOUS | Status: DC
  Administered 2021-10-24: 1000 mL via INTRAVENOUS

## 2021-10-24 MED ORDER — ACETAMINOPHEN 650 MG RE SUPP: 650.0000 mg | Freq: Four times a day (QID) | RECTAL | Status: DC | PRN

## 2021-10-24 MED ORDER — DEXTROSE 50 % IV SOLN: 0.0000 mL | INTRAVENOUS | Status: DC | PRN

## 2021-10-24 MED ORDER — POTASSIUM CHLORIDE 10 MEQ/100ML IV SOLN
10.0000 meq | INTRAVENOUS | Status: AC
  Administered 2021-10-24 (×2): 10 meq via INTRAVENOUS
  Filled 2021-10-24: qty 100

## 2021-10-24 MED ORDER — ACETAMINOPHEN 325 MG PO TABS: 650.0000 mg | ORAL_TABLET | Freq: Four times a day (QID) | ORAL | Status: DC | PRN

## 2021-10-24 MED ORDER — INSULIN REGULAR(HUMAN) IN NACL 100-0.9 UT/100ML-% IV SOLN
INTRAVENOUS | Status: DC
  Administered 2021-10-24: 18 [IU]/h via INTRAVENOUS

## 2021-10-24 MED ORDER — METOCLOPRAMIDE HCL 10 MG PO TABS
5.0000 mg | ORAL_TABLET | Freq: Four times a day (QID) | ORAL | Status: DC | PRN
  Administered 2021-10-25: 5 mg via ORAL
  Filled 2021-10-24: qty 1

## 2021-10-24 MED ORDER — INSULIN REGULAR(HUMAN) IN NACL 100-0.9 UT/100ML-% IV SOLN
INTRAVENOUS | Status: DC
  Administered 2021-10-24: 18 [IU]/h via INTRAVENOUS
  Filled 2021-10-24: qty 100

## 2021-10-24 MED ORDER — KETOROLAC TROMETHAMINE 15 MG/ML IJ SOLN
15.0000 mg | Freq: Once | INTRAMUSCULAR | Status: AC
  Administered 2021-10-24: 15 mg via INTRAVENOUS
  Filled 2021-10-24: qty 1

## 2021-10-24 MED ORDER — HYDROCODONE-ACETAMINOPHEN 5-325 MG PO TABS: 1.0000 | ORAL_TABLET | ORAL | Status: DC | PRN

## 2021-10-24 MED ORDER — HYDROXYZINE HCL 25 MG PO TABS: 50.0000 mg | ORAL_TABLET | Freq: Three times a day (TID) | ORAL | Status: DC | PRN

## 2021-10-24 MED ORDER — PROCHLORPERAZINE EDISYLATE 10 MG/2ML IJ SOLN
10.0000 mg | Freq: Once | INTRAMUSCULAR | Status: AC
  Administered 2021-10-24: 10 mg via INTRAVENOUS
  Filled 2021-10-24: qty 2

## 2021-10-24 MED ORDER — TRAZODONE HCL 50 MG PO TABS
150.0000 mg | ORAL_TABLET | Freq: Every day | ORAL | Status: DC
  Administered 2021-10-24: 150 mg via ORAL
  Filled 2021-10-24: qty 3

## 2021-10-24 MED ORDER — DEXTROSE IN LACTATED RINGERS 5 % IV SOLN: INTRAVENOUS | Status: DC

## 2021-10-24 MED ORDER — ATORVASTATIN CALCIUM 40 MG PO TABS: 40.0000 mg | ORAL_TABLET | Freq: Every day | ORAL | Status: DC

## 2021-10-24 MED ORDER — RIVAROXABAN 10 MG PO TABS
20.0000 mg | ORAL_TABLET | Freq: Every evening | ORAL | Status: DC
  Administered 2021-10-24: 20 mg via ORAL
  Filled 2021-10-24: qty 1
  Filled 2021-10-24: qty 2

## 2021-10-24 MED ORDER — BUPROPION HCL ER (SR) 100 MG PO TB12
100.0000 mg | ORAL_TABLET | Freq: Every morning | ORAL | Status: DC
Start: 2021-10-25 — End: 2021-10-25
  Administered 2021-10-25: 100 mg via ORAL
  Filled 2021-10-24 (×2): qty 1

## 2021-10-24 MED ORDER — LACTATED RINGERS IV SOLN: INTRAVENOUS | Status: DC

## 2021-10-24 MED ORDER — DIPHENHYDRAMINE HCL 50 MG/ML IJ SOLN
12.5000 mg | Freq: Once | INTRAMUSCULAR | Status: AC
  Administered 2021-10-24: 12.5 mg via INTRAVENOUS
  Filled 2021-10-24: qty 1

## 2021-10-24 MED ORDER — LACTATED RINGERS IV BOLUS
2000.0000 mL | Freq: Once | INTRAVENOUS | Status: AC
  Administered 2021-10-24: 2000 mL via INTRAVENOUS

## 2021-10-24 NOTE — Assessment & Plan Note (Signed)
chroic stable ?

## 2021-10-24 NOTE — Assessment & Plan Note (Signed)
Follow-up as an outpatient with nutrition °

## 2021-10-24 NOTE — Assessment & Plan Note (Signed)
restart xarelto ?

## 2021-10-24 NOTE — Subjective & Objective (Signed)
Was supposed to have her PICC line removed on March 10th ?She had it for fungemia ?Hx of UPJ ?Hx of BKA with limitted mobility ? Hx of PT/DVT, endocarditis ?Has been taking her xarelto but not her insulin for the past few days ?PICC line site very painful ? ?

## 2021-10-24 NOTE — Assessment & Plan Note (Signed)
Start insulin drip for hyperglycemia transition to home dose when BG stable ?order diabetes coordinator consult ?

## 2021-10-24 NOTE — Assessment & Plan Note (Signed)
In the setting of hyperglycemia ?

## 2021-10-24 NOTE — Assessment & Plan Note (Signed)
chronic PT OT assessment prior to dc, pt with limited mobility ?

## 2021-10-24 NOTE — H&P (Signed)
? ? Karen Bennett:[id number] DOB: Oct 18, 1975 DOA: 10/24/2021 ? ? ?  ?PCP: [name], NP   ?Outpatient Specialists: * NONE ? [facility] ? ?Patient arrived to ER on 10/24/21 at 1817 ?Referred by Attending Therisa Doyne, MD ? ? ?Patient coming from:   ? home Lives alone,     ?  ? ?Chief Complaint:   ?Chief Complaint  ?Patient presents with  ? Vascular Access Problem  ?   ?  ? Hyperglycemia  ?   ?  ? ? ?HPI: ?Karen Bennett is a 46 y.o. female with medical history significant of DM2, HTN, obesity PE, endocarditis  right BKA ? ?Presented with   PICC line pain and nausea has not been using her meds for few days ?Was supposed to have her PICC line removed on March 10th ?She had it for fungemia ?Hx of UPJ ?Hx of BKA with limitted mobility ? Hx of PT/DVT, endocarditis ?Has been taking her xarelto but not her insulin for the past few days ?PICC line site very painful ? ? 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  During hospitalization, patient underwent right stent placement by urology.  Blood cultures were negative but patient continued to have fever.  Renal function has trended up from initial improvement.  Blood cultures showing Candida.  ID has been consulted  ?PICC line paced by IR ID recommend to continue with antifungal till September 30, 2021 ?  ?  ?Regarding pertinent Chronic problems:   ? ? Hyperlipidemia -  on statins Lipitor ?Lipid Panel  ?   ?Component Value Date/Time  ? CHOL 327 (H) 09/01/2013 0954  ? TRIG 1,304 (H) 09/01/2013 0954  ? HDL 35 (L) 09/01/2013 0954  ? CHOLHDL 9.3 09/01/2013 0954  ? VLDL NOT CALC 09/01/2013 0954  ? LDLCALC NOT CALC 09/01/2013 0954  ? ? ? HTN on metoprolol ? DM 2 -  ?Lab Results  ?Component Value Date  ? HGBA1C 14.6 (H) 09/03/2021  ? on insulin,   ?  ? Morbid obesity-   ?BMI Readings from Last 1 Encounters:  ?10/24/21 69.40 kg/m?  ?  ? ? Hx of DVT/PE on - anticoagulation with  Xarelto  ?  ?While in ER: ?  ?PICC line still in place noted to be hyperglycemic started on insulin drip ? ? ? ?Ordered ? ?  ? ?CXR -  NON acute PICC IN SVC ?  ? ?Following Medications were ordered in ER: ?Medications  ?insulin regular, human (MYXREDLIN) 100 units/ 100 mL infusion (18 Units/hr Intravenous New Bag/Given 10/24/21 2012)  ?lactated ringers infusion ( Intravenous New Bag/Given 10/24/21 2002)  ?dextrose 50 % solution 0-50 mL (has no administration in time range)  ?atorvastatin (LIPITOR) tablet 40 mg (has no administration in time range)  ?buPROPion ER [facility] SR) 12 hr tablet 100 mg (has no administration in time range)  ?hydrOXYzine (VISTARIL) capsule 50 mg (has no administration in time range)  ?metoCLOPramide (REGLAN) tablet 5 mg (has no administration in time range)  ?oxyCODONE (Oxy IR/ROXICODONE) immediate release tablet 15 mg (has no administration in time range)  ?traZODone (DESYREL) tablet 150 mg (has no administration in time range)  ?rivaroxaban (XARELTO) tablet 20 mg (has no administration in time range)  ?acetaminophen (TYLENOL) tablet 650 mg (has no administration in time range)  ?  Or  ?acetaminophen (TYLENOL) suppository 650 mg (has no administration in time range)  ?HYDROcodone-acetaminophen (NORCO/VICODIN)  5-325 MG per tablet 1-2 tablet (has no administration in time range)  ?insulin regular, human (MYXREDLIN) 100 units/ 100 mL infusion (has no administration in time range)  ?prochlorperazine (COMPAZINE) injection 10 mg (10 mg Intravenous Given 10/24/21 1940)  ?diphenhydrAMINE (BENADRYL) injection 12.5 mg (12.5 mg Intravenous Given 10/24/21 1941)  ?potassium chloride 10 mEq in 100 mL IVPB (0 mEq Intravenous Stopped 10/24/21 2103)  ?ketorolac (TORADOL) 15 MG/ML injection 15 mg (15 mg Intravenous Given 10/24/21 2014)  ?lactated ringers bolus 2,000 mL (2,000 mLs Intravenous New Bag/Given 10/24/21 2045)  ?   ?  ?ED Triage Vitals  ?Enc Vitals Group  ?   BP 10/24/21 1836 (!) 183/100  ?   Pulse Rate  10/24/21 1836 94  ?   Resp 10/24/21 1836 18  ?   Temp 10/24/21 1836 98.7 ?F (37.1 ?C)  ?   Temp Source 10/24/21 1836 Oral  ?   SpO2 10/24/21 1836 100 %  ?   Weight 10/24/21 2003 (!) 430 lb (195 kg)  ?   Height 10/24/21 2003 5\' 6"  (1.676 m)  ?   Head Circumference --   ?   Peak Flow --   ?   Pain Score --   ?   Pain Loc --   ?   Pain Edu? --   ?   Excl. in GC? --   ?AVWU(98)@TMAX(24)@    ? _________________________________________ ?Significant initial  Findings: ?Abnormal Labs Reviewed  ?CBC WITH DIFFERENTIAL/PLATELET - Abnormal; Notable for the following components:  ?    Result Value  ? Abs Immature Granulocytes 0.08 (*)   ? All other components within normal limits  ?COMPREHENSIVE METABOLIC PANEL - Abnormal; Notable for the following components:  ? Sodium 124 (*)   ? Chloride 92 (*)   ? Glucose, Bld 666 (*)   ? Calcium 8.6 (*)   ? Albumin 2.0 (*)   ? AST 10 (*)   ? All other components within normal limits  ?URINALYSIS, ROUTINE W REFLEX MICROSCOPIC - Abnormal; Notable for the following components:  ? APPearance TURBID (*)   ? Glucose, UA >=500 (*)   ? Hgb urine dipstick SMALL (*)   ? Protein, ur >=300 (*)   ? Leukocytes,Ua LARGE (*)   ? WBC, UA >50 (*)   ? Bacteria, UA FEW (*)   ? All other components within normal limits  ?OSMOLALITY - Abnormal; Notable for the following components:  ? Osmolality 300 (*)   ? All other components within normal limits  ?BASIC METABOLIC PANEL - Abnormal; Notable for the following components:  ? Sodium 127 (*)   ? Chloride 96 (*)   ? CO2 21 (*)   ? Glucose, Bld 536 (*)   ? Calcium 8.5 (*)   ? All other components within normal limits  ?CBC WITH DIFFERENTIAL/PLATELET - Abnormal; Notable for the following components:  ? Hemoglobin 11.9 (*)   ? Abs Immature Granulocytes 0.08 (*)   ? All other components within normal limits  ?CBG MONITORING, ED - Abnormal; Notable for the following components:  ? Glucose-Capillary >600 (*)   ? All other components within normal limits  ?I-STAT VENOUS BLOOD GAS,  ED - Abnormal; Notable for the following components:  ? Sodium 125 (*)   ? All other components within normal limits  ?CBG MONITORING, ED - Abnormal; Notable for the following components:  ? Glucose-Capillary >600 (*)   ? All other components within normal limits  ?CBG MONITORING, ED - Abnormal; Notable for  the following components:  ? Glucose-Capillary 546 (*)   ? All other components within normal limits  ?CBG MONITORING, ED - Abnormal; Notable for the following components:  ? Glucose-Capillary 423 (*)   ? All other components within normal limits  ?  ? ?The recent clinical data is shown below. ?Vitals:  ? 10/24/21 1836 10/24/21 2000 10/24/21 2003 10/24/21 2030  ?BP: (!) 183/100 (!) 166/78  (!) 176/80  ?Pulse: 94 85  82  ?Resp: 18   17  ?Temp: 98.7 ?F (37.1 ?C)     ?TempSrc: Oral     ?SpO2: 100% 97%  98%  ?Weight:   (!) 195 kg   ?Height:   5\' 6"  (1.676 m)   ?  ?  ?WBC ? ?   ?Component Value Date/Time  ? WBC 6.5 10/24/2021 2100  ? LYMPHSABS 2.0 10/24/2021 2100  ? MONOABS 0.4 10/24/2021 2100  ? EOSABS 0.1 10/24/2021 2100  ? BASOSABS 0.0 10/24/2021 2100  ? ? ?    ? ? UA  pyuria ?  ?Urine analysis: ?   ?Component Value Date/Time  ? COLORURINE YELLOW 10/24/2021 1944  ? APPEARANCEUR TURBID (A) 10/24/2021 1944  ? LABSPEC 1.021 10/24/2021 1944  ? PHURINE 6.0 10/24/2021 1944  ? GLUCOSEU >=500 (A) 10/24/2021 1944  ? HGBUR SMALL (A) 10/24/2021 1944  ? BILIRUBINUR NEGATIVE 10/24/2021 1944  ? 12/24/2021 NEGATIVE 10/24/2021 1944  ? PROTEINUR >=300 (A) 10/24/2021 1944  ? UROBILINOGEN 0.2 09/05/2011 0558  ? NITRITE NEGATIVE 10/24/2021 1944  ? LEUKOCYTESUR LARGE (A) 10/24/2021 1944  ? ?  ?Blood culture (routine x 2)     Status: Abnormal  ? Collection Time: 09/03/21  1:04 PM  ? Specimen: BLOOD  ?Result Value Ref Range Status  ? Specimen Description   Final  ?  BLOOD BLOOD RIGHT FOREARM ?Performed at [facility], [address] [place], [place] ?  ? Special Requests   Final  ?  BOTTLES DRAWN AEROBIC AND  ANAEROBIC Blood Culture adequate volume ?Performed at [facility], [address] [place], [place] ?  ? Culture  Setup Time   Final  ?  YEAST ?IN BOTH AEROBIC AND ANAEROBIC

## 2021-10-24 NOTE — Assessment & Plan Note (Addendum)
Pt reports no fever but malaise and pain around the picc site, will need IR consult to see if tunneled line can be taken out  ?Repeat cultures,   no redness or swelling noted around the site ? ?

## 2021-10-24 NOTE — ED Triage Notes (Signed)
Pt c/o picc line pain, supposed to be removed 3/8, not removed r/t transportation issues. Pain/inflammation noted to site. CBG >600 w EMS, pt reports not taking insulin x "a few days," no meds PTA today.  ? ?AKA ? ?160 systolic ?99 HR  ?

## 2021-10-24 NOTE — ED Provider Triage Note (Signed)
Emergency Medicine Provider Triage Evaluation Note ? ?Karen Bennett , a 46 y.o. female  was evaluated in triage.  Pt complains of needing port removed. The patient was supposed to see Dr.Vu to get her port removed on 09/28/21, the patient did not have adequate transportation. Patient reports pain at the site. CBG with EMS >600. Has not taken diabetes medications over the past few days.  ? ?Review of Systems  ?Positive: See above, nausea ?Negative: Chest pain, SOB ? ?Physical Exam  ?There were no vitals taken for this visit. ?Gen:   Awake, no distress   ?Resp:  Normal effort  ?MSK:   Moves extremities without difficulty  ?Other:  Last dressing change 09/26/21. Tunneled line in right chest. ? ?Medical Decision Making  ?Medically screening exam initiated at 6:35 PM.  Appropriate orders placed.  Karen Bennett was informed that the remainder of the evaluation will be completed by another provider, this initial triage assessment does not replace that evaluation, and the importance of remaining in the ED until their evaluation is complete. ? ?Greater than 600 BS here. Will order labs. ?  ?Sherrell Puller, PA-C ?10/24/21 1838 ? ?

## 2021-10-24 NOTE — Assessment & Plan Note (Signed)
Re-start Lipitor 

## 2021-10-24 NOTE — Assessment & Plan Note (Signed)
Pt denies dysuria, urine obtained not on sterile fassion  ?Will hold off on antibiotics ?

## 2021-10-24 NOTE — ED Provider Notes (Signed)
?[facility] ?Provider Note ? ? ?CSN: [id number] ?Arrival date & time: 10/24/21  1817 ? ?  ? ?History ?Chief Complaint  ?Patient presents with  ? Vascular Access Problem  ?   ?  ? Hyperglycemia  ?   ?  ? ? ?Karen Bennett is a 46 y.o. female w/ past medical history of diabetes, hypertension, morbid obesity presenting to the ED for PICC line pain and nausea.  Patient reports she has not been taking her insulin in the last 3 days.  She normally takes 50 units of Lantus at night and uses sliding scale during the day.  She reports she does have her PICC line removed but was unable to get there due to transport. ? ? ?Hyperglycemia ?Associated symptoms: nausea   ?Associated symptoms: no abdominal pain and no fever   ? ?  ? ?Home Medications ?Prior to Admission medications   ?Medication Sig Start Date End Date Taking? Authorizing Provider  ?acetaminophen (TYLENOL) 325 MG tablet Take 2 tablets (650 mg total) by mouth every 6 (six) hours as needed for mild pain or moderate pain. 02/09/21  Yes Annita Brod, MD  ?atorvastatin (LIPITOR) 40 MG tablet Take 1 tablet (40 mg total) by mouth daily at 6 PM. 05/12/13  Yes Buriev, Arie Sabina, MD  ?BIOTIN PO Take 2 capsules by mouth daily. Unsure of dose   Yes [provider]  ?buPROPion ER (WELLBUTRIN SR) 100 MG 12 hr tablet Take 100 mg by mouth every morning. 05/09/21  Yes [provider]  ?diazepam (VALIUM) 5 MG tablet Take 5 mg by mouth every 8 (eight) hours as needed for anxiety. 03/30/21  Yes [provider]  ?furosemide (LASIX) 40 MG tablet Take 40 mg by mouth daily. 11/12/20  Yes [provider]  ?HUMALOG KWIKPEN 100 UNIT/ML KwikPen 0-12 Units 3 (three) times daily. Inject subcutaneously twice daily as per sliding scale: if 70-150= 0 units; 151-200= 2 u; 201-250= 4 u; 251-300= 6 u; 301-350= 8 u; 351-400= 10 u; 401-450= 12u; anything greater than 400 call physician. 10/27/20  Yes [provider]   ?hydrOXYzine (VISTARIL) 50 MG capsule Take 50 mg by mouth every 8 (eight) hours as needed for itching.   Yes [provider]  ?ibuprofen (ADVIL) 200 MG tablet Take 400-600 mg by mouth every 6 (six) hours as needed for headache or moderate pain.   Yes [provider]  ?insulin glargine (SEMGLEE, YFGN,) 100 UNIT/ML Solostar Pen Inject 50 Units into the skin at bedtime. 03/30/21  Yes [provider]  ?metoCLOPramide (REGLAN) 5 MG tablet Take 1 tablet (5 mg total) by mouth every 6 (six) hours as needed for nausea. 02/09/21  Yes Annita Brod, MD  ?metoprolol tartrate (LOPRESSOR) 25 MG tablet Take 0.5 tablets (12.5 mg total) by mouth 2 (two) times daily. 02/09/21  Yes Annita Brod, MD  ?oxyCODONE (ROXICODONE) 15 MG immediate release tablet Take 1 tablet (15 mg total) by mouth every 6 (six) hours as needed for pain. 09/13/21  Yes Oswald Hillock, MD  ?potassium chloride (KLOR-CON) 10 MEQ tablet Take 10 mEq by mouth daily. 05/09/21  Yes [provider]  ?promethazine (PHENERGAN) 25 MG tablet Take 25 mg by mouth 2 (two) times daily as needed for nausea/vomiting or nausea. 01/12/21  Yes [provider]  ?traZODone (DESYREL) 150 MG tablet Take 150 mg by mouth at bedtime. 03/30/21  Yes [provider]  ?XARELTO 20 MG TABS tablet Take 20 mg  by mouth every evening. 11/12/20  Yes [provider]  ?caspofungin (CANCIDAS) 50 MG injection Inject into the vein. ?Patient not taking: Reported on 10/24/2021 09/20/21   [provider]  ?collagenase (SANTYL) ointment Apply topically daily. To left leg ?Patient not taking: Reported on 09/03/2021 01/04/21   Domenic Polite, MD  ?glucose blood test strip Use as instructed 06/26/13   Lorayne Marek, MD  ?glucose monitoring kit (FREESTYLE) monitoring kit 1 each by Does not apply route as needed for other. 06/26/13   Lorayne Marek, MD  ?   ? ?Allergies    ?Clindamycin, Zofran, Doxycycline, and Morphine and related   ? ?Review of  Systems   ?Review of Systems  ?Constitutional:  Negative for chills and fever.  ?Gastrointestinal:  Positive for nausea. Negative for abdominal pain.  ? ?Physical Exam ?Updated Vital Signs ?BP (!) 176/80   Pulse 82   Temp 98.7 ?F (37.1 ?C) (Oral)   Resp 17   Ht 5' 6"  (1.676 m)   Wt (!) 195 kg   SpO2 98%   BMI 69.40 kg/m?  ?Physical Exam ?Vitals and nursing note reviewed.  ?Constitutional:   ?   Appearance: She is obese.  ?Cardiovascular:  ?   Rate and Rhythm: Normal rate and regular rhythm.  ?Pulmonary:  ?   Effort: Pulmonary effort is normal. No respiratory distress.  ?Musculoskeletal:     ?   General: No swelling or tenderness.  ?   Comments: RLE BKA  ?Neurological:  ?   Mental Status: She is alert.  ? ? ?ED Results / Procedures / Treatments   ?Labs ?(all labs ordered are listed, but only abnormal results are displayed) ?Labs Reviewed  ?CBC WITH DIFFERENTIAL/PLATELET - Abnormal; Notable for the following components:  ?    Result Value  ? Abs Immature Granulocytes 0.08 (*)   ? All other components within normal limits  ?COMPREHENSIVE METABOLIC PANEL - Abnormal; Notable for the following components:  ? Sodium 124 (*)   ? Chloride 92 (*)   ? Glucose, Bld 666 (*)   ? Calcium 8.6 (*)   ? Albumin 2.0 (*)   ? AST 10 (*)   ? All other components within normal limits  ?URINALYSIS, ROUTINE W REFLEX MICROSCOPIC - Abnormal; Notable for the following components:  ? APPearance TURBID (*)   ? Glucose, UA >=500 (*)   ? Hgb urine dipstick SMALL (*)   ? Protein, ur >=300 (*)   ? Leukocytes,Ua LARGE (*)   ? WBC, UA >50 (*)   ? Bacteria, UA FEW (*)   ? All other components within normal limits  ?OSMOLALITY - Abnormal; Notable for the following components:  ? Osmolality 300 (*)   ? All other components within normal limits  ?BASIC METABOLIC PANEL - Abnormal; Notable for the following components:  ? Sodium 127 (*)   ? Chloride 96 (*)   ? CO2 21 (*)   ? Glucose, Bld 536 (*)   ? Calcium 8.5 (*)   ? All other components within  normal limits  ?CBC WITH DIFFERENTIAL/PLATELET - Abnormal; Notable for the following components:  ? Hemoglobin 11.9 (*)   ? Abs Immature Granulocytes 0.08 (*)   ? All other components within normal limits  ?CBG MONITORING, ED - Abnormal; Notable for the following components:  ? Glucose-Capillary >600 (*)   ? All other components within normal limits  ?I-STAT VENOUS BLOOD GAS, ED - Abnormal; Notable for the following components:  ? Sodium 125 (*)   ?  All other components within normal limits  ?I-STAT BETA HCG BLOOD, ED ([facility], [facility], [facility] ONLY) - Abnormal; Notable for the following components:  ? I-stat hCG, quantitative 6.4 (*)   ? All other components within normal limits  ?CBG MONITORING, ED - Abnormal; Notable for the following components:  ? Glucose-Capillary >600 (*)   ? All other components within normal limits  ?CBG MONITORING, ED - Abnormal; Notable for the following components:  ? Glucose-Capillary 546 (*)   ? All other components within normal limits  ?CBG MONITORING, ED - Abnormal; Notable for the following components:  ? Glucose-Capillary 423 (*)   ? All other components within normal limits  ?CULTURE, BLOOD (ROUTINE X 2)  ?CULTURE, BLOOD (ROUTINE X 2)  ?CULTURE, BLOOD (SINGLE)  ?URINE CULTURE  ?PREGNANCY, URINE  ?BETA-HYDROXYBUTYRIC ACID  ?BASIC METABOLIC PANEL  ?BASIC METABOLIC PANEL  ?MAGNESIUM  ?PHOSPHORUS  ?CBC WITH DIFFERENTIAL/PLATELET  ?TSH  ?COMPREHENSIVE METABOLIC PANEL  ?BASIC METABOLIC PANEL  ?BASIC METABOLIC PANEL  ?BASIC METABOLIC PANEL  ?BASIC METABOLIC PANEL  ?BLOOD GAS, VENOUS  ? ? ?EKG ?None ? ?Radiology ?DG Chest 2 View ? ?Result Date: 10/24/2021 ?CLINICAL DATA:  PICC line pain EXAM: CHEST - 2 VIEW COMPARISON:  09/03/2021 FINDINGS: Right-sided central venous catheter tip over the SVC. No focal opacity, pleural effusion or pneumothorax. Borderline to mild cardiomegaly with slight central congestion. IMPRESSION: 1. Right-sided central venous catheter tip over the SVC. 2. Borderline to mild  cardiomegaly with slight central congestion Electronically Signed   By: Donavan Foil M.D.   On: 10/24/2021 20:10   ? ?Procedures ?Procedures  ? ? ?Medications Ordered in ED ?Medications  ?insulin regular, human (MYXREDLIN

## 2021-10-25 ENCOUNTER — Observation Stay (HOSPITAL_COMMUNITY): Payer: Medicare Other

## 2021-10-25 DIAGNOSIS — R739 Hyperglycemia, unspecified: Secondary | ICD-10-CM | POA: Diagnosis not present

## 2021-10-25 DIAGNOSIS — T82848A Pain from vascular prosthetic devices, implants and grafts, initial encounter: Secondary | ICD-10-CM | POA: Diagnosis not present

## 2021-10-25 HISTORY — PX: IR RADIOLOGIST EVAL & MGMT: IMG5224

## 2021-10-25 LAB — CBC WITH DIFFERENTIAL/PLATELET
Abs Immature Granulocytes: 0.1 10*3/uL — ABNORMAL HIGH (ref 0.00–0.07)
Basophils Absolute: 0 10*3/uL (ref 0.0–0.1)
Basophils Relative: 0 %
Eosinophils Absolute: 0.2 10*3/uL (ref 0.0–0.5)
Eosinophils Relative: 2 %
HCT: 36.6 % (ref 36.0–46.0)
Hemoglobin: 12 g/dL (ref 12.0–15.0)
Immature Granulocytes: 1 %
Lymphocytes Relative: 36 %
Lymphs Abs: 2.7 10*3/uL (ref 0.7–4.0)
MCH: 27.9 pg (ref 26.0–34.0)
MCHC: 32.8 g/dL (ref 30.0–36.0)
MCV: 85.1 fL (ref 80.0–100.0)
Monocytes Absolute: 0.6 10*3/uL (ref 0.1–1.0)
Monocytes Relative: 9 %
Neutro Abs: 3.8 10*3/uL (ref 1.7–7.7)
Neutrophils Relative %: 52 %
Platelets: 348 10*3/uL (ref 150–400)
RBC: 4.3 MIL/uL (ref 3.87–5.11)
RDW: 13.2 % (ref 11.5–15.5)
WBC: 7.5 10*3/uL (ref 4.0–10.5)
nRBC: 0 % (ref 0.0–0.2)

## 2021-10-25 LAB — COMPREHENSIVE METABOLIC PANEL
ALT: 6 U/L (ref 0–44)
AST: 8 U/L — ABNORMAL LOW (ref 15–41)
Albumin: 1.8 g/dL — ABNORMAL LOW (ref 3.5–5.0)
Alkaline Phosphatase: 63 U/L (ref 38–126)
Anion gap: 7 (ref 5–15)
BUN: 11 mg/dL (ref 6–20)
CO2: 25 mmol/L (ref 22–32)
Calcium: 8.4 mg/dL — ABNORMAL LOW (ref 8.9–10.3)
Chloride: 98 mmol/L (ref 98–111)
Creatinine, Ser: 0.89 mg/dL (ref 0.44–1.00)
GFR, Estimated: >60 mL/min (ref >60)
Glucose, Bld: 162 mg/dL — ABNORMAL HIGH (ref 70–99)
Potassium: 4.3 mmol/L (ref 3.5–5.1)
Sodium: 130 mmol/L — ABNORMAL LOW (ref 135–145)
Total Bilirubin: 0.5 mg/dL (ref 0.3–1.2)
Total Protein: 6.4 g/dL — ABNORMAL LOW (ref 6.5–8.1)

## 2021-10-25 LAB — BASIC METABOLIC PANEL
Anion gap: 7 (ref 5–15)
BUN: 10 mg/dL (ref 6–20)
CO2: 21 mmol/L — ABNORMAL LOW (ref 22–32)
Calcium: 8.6 mg/dL — ABNORMAL LOW (ref 8.9–10.3)
Chloride: 101 mmol/L (ref 98–111)
Creatinine, Ser: 0.81 mg/dL (ref 0.44–1.00)
GFR, Estimated: >60 mL/min (ref >60)
Glucose, Bld: 191 mg/dL — ABNORMAL HIGH (ref 70–99)
Potassium: 3.7 mmol/L (ref 3.5–5.1)
Sodium: 129 mmol/L — ABNORMAL LOW (ref 135–145)

## 2021-10-25 LAB — CBG MONITORING, ED
Glucose-Capillary: 145 mg/dL — ABNORMAL HIGH (ref 70–99)
Glucose-Capillary: 158 mg/dL — ABNORMAL HIGH (ref 70–99)
Glucose-Capillary: 166 mg/dL — ABNORMAL HIGH (ref 70–99)
Glucose-Capillary: 215 mg/dL — ABNORMAL HIGH (ref 70–99)
Glucose-Capillary: 221 mg/dL — ABNORMAL HIGH (ref 70–99)

## 2021-10-25 LAB — TSH: TSH: 8.894 u[IU]/mL — ABNORMAL HIGH (ref 0.350–4.500)

## 2021-10-25 LAB — MAGNESIUM: Magnesium: 1.5 mg/dL — ABNORMAL LOW (ref 1.7–2.4)

## 2021-10-25 LAB — PHOSPHORUS: Phosphorus: 4 mg/dL (ref 2.5–4.6)

## 2021-10-25 MED ORDER — INSULIN ASPART 100 UNIT/ML IJ SOLN: 0.0000 [IU] | Freq: Every day | INTRAMUSCULAR | Status: DC

## 2021-10-25 MED ORDER — INSULIN GLARGINE-YFGN 100 UNIT/ML ~~LOC~~ SOLN
50.0000 [IU] | Freq: Every day | SUBCUTANEOUS | Status: DC
  Administered 2021-10-25: 50 [IU] via SUBCUTANEOUS
  Filled 2021-10-25: qty 0.5

## 2021-10-25 MED ORDER — LEVOTHYROXINE SODIUM 25 MCG PO TABS
50.0000 ug | ORAL_TABLET | Freq: Every day | ORAL | Status: DC
  Administered 2021-10-25: 50 ug via ORAL
  Filled 2021-10-25: qty 2

## 2021-10-25 MED ORDER — INSULIN ASPART 100 UNIT/ML IJ SOLN
0.0000 [IU] | Freq: Three times a day (TID) | INTRAMUSCULAR | Status: DC
  Administered 2021-10-25: 5 [IU] via SUBCUTANEOUS

## 2021-10-25 MED ORDER — MAGNESIUM SULFATE 4 GM/100ML IV SOLN
4.0000 g | Freq: Once | INTRAVENOUS | Status: AC
  Administered 2021-10-25: 4 g via INTRAVENOUS
  Filled 2021-10-25: qty 100

## 2021-10-25 MED ORDER — LEVOTHYROXINE SODIUM 50 MCG PO TABS: 50.0000 ug | ORAL_TABLET | Freq: Every day | ORAL | 0 refills | Status: DC

## 2021-10-25 MED ORDER — LACTATED RINGERS IV SOLN: INTRAVENOUS | Status: DC

## 2021-10-25 NOTE — Discharge Planning (Signed)
Pt currently active with [facility] and [facility] for Home Health services as confirmed by [facility] with Pam of [facility].  Pt will resume HH services with [facility] and [facility]. ?

## 2021-10-25 NOTE — Evaluation (Signed)
Physical Therapy Evaluation ?Patient Details ?Name: Karen Bennett ?MRN: [id number] ?DOB: August 22, 1975 ?Today's Date: 10/25/2021 ? ?History of Present Illness ? Pt is a 46 y/o female presenting on 4/3 with hypergycemia, PICC line pain. PMH includes: DM2, HTN, obesity, R BKA, PE, DVT, endocarditis, cellulitis.  ?Clinical Impression ? Patient presented to the ED on 10/24/21 with hyperglycemia and PICC line pain. Pt's impairments include decreased activity tolerance and strength that are limiting her ability to safely and independently perform bed mobility and transfers. Patient mod I with long sitting and repositioning in bed. Further mobility deferred due to pt concerns of her peripheral IV pulling out with mobility. SPT recommending Home Health PT upon D/C due to mobility deficits. PT will continue to follow acutely to maximize pt's safety and independence with functional mobility. ?   ?   ? ?Recommendations for follow up therapy are one component of a multi-disciplinary discharge planning process, led by the attending physician.  Recommendations may be updated based on patient status, additional functional criteria and insurance authorization. ? ?Follow Up Recommendations Home health PT ? ?  ?Assistance Recommended at Discharge Intermittent Supervision/Assistance  ?Patient can return home with the following ? Assistance with cooking/housework;Assist for transportation;Help with stairs or ramp for entrance ? ?  ?Equipment Recommendations None recommended by PT  ?Recommendations for Other Services ?    ?  ?Functional Status Assessment Patient has had a recent decline in their functional status and demonstrates the ability to make significant improvements in function in a reasonable and predictable amount of time.  ? ?  ?Precautions / Restrictions Precautions ?Precautions: Fall ?Precaution Comments: R BKA ?Restrictions ?Weight Bearing Restrictions: Yes ?RLE Weight Bearing: Non weight bearing  ? ?  ? ?Mobility ? Bed  Mobility ?Overal bed mobility: Modified Independent ?  ?  ?  ?  ?  ?  ?General bed mobility comments: transitioned from supine to long sitting with no assist. Able to scoot up in the bed mod I with increased time and effort. Pt declined further mobility assessment. ?  ? ?Transfers ?  ?  ?  ?  ?  ?  ?  ?  ?  ?  ?  ? ?Ambulation/Gait ?  ?  ?  ?  ?  ?  ?  ?  ? ?Stairs ?  ?  ?  ?  ?  ? ?Wheelchair Mobility ?  ? ?Modified Rankin (Stroke Patients Only) ?  ? ?  ? ?Balance Overall balance assessment: Modified Independent ?  ?  ?  ?  ?  ?  ?  ?  ?  ?  ?  ?  ?  ?  ?  ?  ?  ?  ?   ? ? ? ?Pertinent Vitals/Pain Pain Assessment ?Pain Assessment: 0-10 ?Pain Score: 7  ?Pain Location: PICC line site ?Pain Descriptors / Indicators: Burning, Tender ?Pain Intervention(s): Monitored during session  ? ? ?Home Living Family/patient expects to be discharged to:: Private residence ?Living Arrangements: Alone ?Available Help at Discharge: Personal care attendant ?Type of Home: Apartment ?Home Access: Level entry ?  ?  ?  ?Home Layout: One level ?Home Equipment: Wheelchair - power;Tub bench;Grab bars - toilet;Grab bars - tub/shower;Adaptive equipment;Hand held shower head ?Additional Comments: 3-9 aide assist, M-F and every other weekend  ?  ?Prior Function Prior Level of Function : Independent/Modified Independent ?  ?  ?  ?  ?  ?  ?Mobility Comments: wc for mobility, independent transfers and uses PTAR for transportation ?  ADLs Comments: independent ADLs, assist for IADLs from aide ?  ? ? ?Hand Dominance  ?   ? ?  ?Extremity/Trunk Assessment  ? Upper Extremity Assessment ?Upper Extremity Assessment: Defer to OT evaluation ?  ? ?Lower Extremity Assessment ?Lower Extremity Assessment: RLE deficits/detail;LLE deficits/detail ?RLE Deficits / Details: consistent with R BKA ?  ? ?Cervical / Trunk Assessment ?Cervical / Trunk Assessment: Other exceptions ?Cervical / Trunk Exceptions: obese  ?Communication  ? Communication: No difficulties   ?Cognition Arousal/Alertness: Awake/alert ?Behavior During Therapy: [facility] for tasks assessed/performed ?Overall Cognitive Status: Within Functional Limits for tasks assessed ?  ?  ?  ?  ?  ?  ?  ?  ?  ?  ?  ?  ?  ?  ?  ?  ?  ?  ?  ? ?  ?General Comments General comments (skin integrity, edema, etc.): limited session due to pt voicing concerns with loosing her IV access. She demonstrated bed level ADLs and verbally describing how she performs A/P transfer into bed. She has good support from aide and reports she has been managing at home in power wheelchair. ? ?  ?Exercises    ? ?Assessment/Plan  ?  ?PT Assessment Patient needs continued PT services  ?PT Problem List Decreased range of motion;Decreased activity tolerance;Decreased mobility;Obesity;Pain ? ?   ?  ?PT Treatment Interventions DME instruction;Functional mobility training;Therapeutic activities;Therapeutic exercise;Patient/family education   ? ?PT Goals (Current goals can be found in the Care Plan section)  ?Acute Rehab PT Goals ?Patient Stated Goal: to go home ?PT Goal Formulation: With patient ?Time For Goal Achievement: 11/08/21 ?Potential to Achieve Goals: Good ? ?  ?Frequency Min 3X/week ?  ? ? ?Co-evaluation   ?  ?  ?  ?  ? ? ?  ?AM-PAC PT "6 Clicks" Mobility  ?Outcome Measure Help needed turning from your back to your side while in a flat bed without using bedrails?: None ?Help needed moving from lying on your back to sitting on the side of a flat bed without using bedrails?: None ?Help needed moving to and from a bed to a chair (including a wheelchair)?: A Little ?Help needed standing up from a chair using your arms (e.g., wheelchair or bedside chair)?: Total ?Help needed to walk in hospital room?: Total ?Help needed climbing 3-5 steps with a railing? : Total ?6 Click Score: 14 ? ?  ?End of Session   ?Activity Tolerance: Patient tolerated treatment well ?Patient left: in bed;with call bell/phone within reach;Other (comment) (on hospital bed in  ED) ?Nurse Communication: Mobility status ?PT Visit Diagnosis: Other abnormalities of gait and mobility (R26.89);Pain ?Pain - part of body:  (PICC line area) ?  ? ?Time: 9485-4627 ?PT Time Calculation (min) (ACUTE ONLY): 14 min ? ? ?Charges:   PT Evaluation ?$PT Eval Moderate Complexity: 1 Mod ?  ?  ?   ? ? ?Enis Slipper, SPT ? ?Enis Slipper ?10/25/2021, 1:15 PM ? ?

## 2021-10-25 NOTE — Procedures (Signed)
Interventional Radiology requested to remove tunneled central venous catheter which was placed 09/12/21 by IR. Tunneled CVC removed at the bedside in the ED.  ?Please see full dictation under imaging tab in Epic. ? Alwyn Ren, AGACNP-BC ?[phone] ?10/25/2021, 11:14 AM ? ? ?

## 2021-10-25 NOTE — Evaluation (Signed)
Occupational Therapy Evaluation ?Patient Details ?Name: Karen Bennett ?MRN: [id number] ?DOB: 02/08/1976 ?Today's Date: 10/25/2021 ? ? ?History of Present Illness Pt is a 46 y/o female presenting on 4/3 with hypergycemia, PICC line pain. PMH includes: DM2, HTN, obesity, R BKA, PE, DVT, endocarditis, cellulitis.  ? ?Clinical Impression ?  ?Pt admitted for above and presents near baseline modified independence for ADLs, supervision for transfers.  She reports using a/p transfers into power chair, lateral scoots to/from commode.  She has aide support at home from 3-9 m-f and every other weekend.  NO questions or concerns from OT standpoint.  OT will sign off, no acute need identified.  ?   ? ?Recommendations for follow up therapy are one component of a multi-disciplinary discharge planning process, led by the attending physician.  Recommendations may be updated based on patient status, additional functional criteria and insurance authorization.  ? ?Follow Up Recommendations ? No OT follow up  ?  ?Assistance Recommended at Discharge Intermittent Supervision/Assistance  ?Patient can return home with the following Assistance with cooking/housework;A little help with bathing/dressing/bathroom;Assist for transportation;Help with stairs or ramp for entrance ? ?  ?Functional Status Assessment ? Patient has not had a recent decline in their functional status  ?Equipment Recommendations ? None recommended by OT  ?  ?Recommendations for Other Services   ? ? ?  ?Precautions / Restrictions Precautions ?Precautions: Fall ?Precaution Comments: R BKA ?Restrictions ?Weight Bearing Restrictions: Yes ?RLE Weight Bearing: Non weight bearing  ? ?  ? ?Mobility Bed Mobility ?Overal bed mobility: Modified Independent ?  ?  ?  ?  ?  ?  ?General bed mobility comments: transitioned from supine to long sitting with no assist ?  ? ?Transfers ?  ?  ?  ?  ?  ?  ?  ?  ?  ?  ?  ? ?  ?Balance Overall balance assessment: Modified Independent ?  ?  ?  ?   ?  ?  ?  ?  ?  ?  ?  ?  ?  ?  ?  ?  ?  ?  ?   ? ?ADL either performed or assessed with clinical judgement  ? ?ADL Overall ADL's : At baseline;Modified independent ?  ?  ?  ?  ?  ?  ?  ?  ?  ?  ?  ?  ?  ?  ?  ?  ?  ?  ?  ?General ADL Comments: pt demonstrates LB dressing bed level with modified independence, A/P scoot long sitting in bed with supervision.  Pt reports feeling at her baseline with ADLs.  ? ? ? ?Vision   ?   ?   ?Perception   ?  ?Praxis   ?  ? ?Pertinent Vitals/Pain Pain Assessment ?Pain Assessment: 0-10 ?Pain Score: 7  ?Pain Location: PICC line site ?Pain Descriptors / Indicators: Burning, Tender ?Pain Intervention(s): Monitored during session  ? ? ? ?Hand Dominance   ?  ?Extremity/Trunk Assessment Upper Extremity Assessment ?Upper Extremity Assessment: Overall WFL for tasks assessed ?  ?Lower Extremity Assessment ?Lower Extremity Assessment: Defer to PT evaluation ?  ?Cervical / Trunk Assessment ?Cervical / Trunk Assessment: Other exceptions ?Cervical / Trunk Exceptions: obese ?  ?Communication Communication ?Communication: No difficulties ?  ?Cognition Arousal/Alertness: Awake/alert ?Behavior During Therapy: [facility] for tasks assessed/performed ?Overall Cognitive Status: Within Functional Limits for tasks assessed ?  ?  ?  ?  ?  ?  ?  ?  ?  ?  ?  ?  ?  ?  ?  ?  ?  ?  ?  ?  General Comments  limited session d/t pt voicing concerns with loosing her IV access.  She demonstrates bed level ADLs ( this is how she does at home) and A/P transfer in bed.  She has good support from aide and reports she has been managing at home in power wc.  No falls reported. ? ?  ?Exercises   ?  ?Shoulder Instructions    ? ? ?Home Living Family/patient expects to be discharged to:: Private residence ?Living Arrangements: Alone ?Available Help at Discharge: Personal care attendant ?Type of Home: Apartment ?Home Access: Level entry ?  ?  ?Home Layout: One level ?  ?  ?Bathroom Shower/Tub: Walk-in shower ?  ?Bathroom Toilet:  Standard ?  ?  ?Home Equipment: Wheelchair - power;Tub bench;Grab bars - toilet;Grab bars - tub/shower;Adaptive equipment;Hand held shower head ?Adaptive Equipment: [place] ?Additional Comments: 3-9 aide assist, M-F and everyother weekend ?  ? ?  ?Prior Functioning/Environment Prior Level of Function : Independent/Modified Independent ?  ?  ?  ?  ?  ?  ?Mobility Comments: wc for mobility, independent transfers and uses PTAR for transportation ?ADLs Comments: independent ADLs, assist for IADLs from aide ?  ? ?  ?  ?OT Problem List: Decreased activity tolerance;Obesity ?  ?   ?OT Treatment/Interventions:    ?  ?OT Goals(Current goals can be found in the care plan section) Acute Rehab OT Goals ?Patient Stated Goal: home ?OT Goal Formulation: With patient  ?OT Frequency:   ?  ? ?Co-evaluation   ?  ?  ?  ?  ? ?  ?AM-PAC OT "6 Clicks" Daily Activity     ?Outcome Measure Help from another person eating meals?: None ?Help from another person taking care of personal grooming?: None ?Help from another person toileting, which includes using toliet, bedpan, or urinal?: None ?Help from another person bathing (including washing, rinsing, drying)?: None ?Help from another person to put on and taking off regular upper body clothing?: None ?Help from another person to put on and taking off regular lower body clothing?: None ?6 Click Score: 24 ?  ?End of Session Nurse Communication: Mobility status ? ?Activity Tolerance: Patient tolerated treatment well ?Patient left: in bed;with call bell/phone within reach ? ?OT Visit Diagnosis: Pain ?Pain - part of body:  (picc line site)  ?              ?Time: 5027-7412 ?OT Time Calculation (min): 13 min ?Charges:  OT General Charges ?$OT Visit: 1 Visit ?OT Evaluation ?$OT Eval Low Complexity: 1 Low ? ?Karen Bennett, OT ?Acute Rehabilitation Services ?Pager [phone] ?Office [phone] ? ? ?Karen Bennett ?10/25/2021, 10:55 AM ?

## 2021-10-25 NOTE — ED Notes (Signed)
Pt placed on hospital bed for comfort.

## 2021-10-25 NOTE — Discharge Instructions (Addendum)
Follow with Primary MD [name], NP in 2-3 days  ? ?Get CBC, CMP, TSH, CBGs, follow blood culture results which were drawn this admission in the next 2 to 3 days  ? ?Activity: As tolerated with Full fall precautions use walker/cane & assistance as needed ? ?Disposition Home   ? ?Diet: Heart Healthy Low Carb ? ?Accuchecks 4 times/day, Once in AM empty stomach and then before each meal. ?Log in all results and show them to your Prim.MD in 3 days. ?If any glucose reading is under 80 or above 300 call your Prim MD immidiately. ?Follow Low glucose instructions for glucose under 80 as instructed. ? ? ?Special Instructions: If you have smoked or chewed Tobacco  in the last 2 yrs please stop smoking, stop any regular Alcohol  and or any Recreational drug use. ? ?On your next visit with your primary care physician please Get Medicines reviewed and adjusted. ? ?Please request your Prim.MD to go over all Hospital Tests and Procedure/Radiological results at the follow up, please get all Hospital records sent to your Prim MD by signing hospital release before you go home. ? ?If you experience worsening of your admission symptoms, develop shortness of breath, life threatening emergency, suicidal or homicidal thoughts you must seek medical attention immediately by calling 911 or calling your MD immediately  if symptoms less severe. ? ?You Must read complete instructions/literature along with all the possible adverse reactions/side effects for all the Medicines you take and that have been prescribed to you. Take any new Medicines after you have completely understood and accpet all the possible adverse reactions/side effects.  ? ? ?

## 2021-10-25 NOTE — Discharge Summary (Signed)
?                                                                                ? Karen Bennett VZC:[id number] DOB: December 30, 1975 DOA: 10/24/2021 ? ?PCP: Pablo Lawrence, NP ? ?Admit date: 10/24/2021  Discharge date: 10/25/2021 ? ?Admitted From: Home   Disposition:  Home ? ? ?Recommendations for Outpatient Follow-up:  ? ?Follow up with PCP in 1-2 weeks ? ?PCP Please obtain BMP/CBC, 2 view CXR in 1week,  (see Discharge instructions)  ? ?PCP Please follow up on the following pending results: Follow the final blood culture results drawn this admission, follow glycemic control closely. ? ?Home Health: Home   ?Equipment/Devices: None  ?Consultations: IR ?Discharge Condition: Stable    ?CODE STATUS: Full    ?Diet Recommendation: Heart Healthy Low Carb ? ?Diet Order   ? ?       ?  Diet heart healthy/carb modified Room service appropriate? Yes; Fluid consistency: Thin  Diet effective now       ?  ? ?  ?  ? ?  ?  ? ?Chief Complaint  ?Patient presents with  ? Vascular Access Problem  ?   ?  ? Hyperglycemia  ?   ?  ?  ? ?Brief history of present illness from the day of admission and additional interim summary   ? ?46 y.o. female with medical history significant of DM2, HTN, obesity PE, endocarditis  right BKA who recently had fungemia for which a right-sided tunneled catheter was placed, he has finished her course of IV medications and the catheter was to be removed several weeks ago but due to scheduling issues it could not be, patient started experiencing some discomfort at the right chest wall tunneled catheter site and presented to the ER where she was found to have high sugars and was kept in the hospital for removal of the catheter and management of high sugars. ? ?                                                               Hospital Course  ? ? ?1.  Mild pain and discomfort at the site of central line in the right chest wall.   Placed by IR, removed, no leukocytosis or fever, blood cultures drawn in the ER are so far negative request PCP to follow final results in the next 3 to 4 days.  Patient requested to report back if any fevers. ? ?2.  Recent history of fungal infection requiring IV medications.  Has finished course. ? ?3.  Morbid obesity with DM type II and with extremely poor control due to hyperglycemia A1c recently was greater than 14.  She is somewhat noncompliant with her insulin and excepted that she missed insulin last few days, strictly counseled on compliance, she was not in DKA, she was treated with IV fluids along with insulin, CBGs now stable, requested to check CBGs q. ACH S at home maintain a logbook  and follow-up with PCP in 2 to 3 days.  Again strictly counseled on compliance ? ?4.  Right BKA.  Supportive care. ? ?5.  History of bacterial endocarditis.  No acute issues. ? ?6.  Anemia of chronic disease.  No acute issues monitor ? ?7.  Hypothyroidism.  TSH close to 10.  Placed on Synthroid PCP to monitor TSH and Synthroid dose. ? ?8.  Dyslipidemia.  On home dose statin. ? ?9.  HX of PE.  On Xarelto ? ? ?Discharge diagnosis   ? ? ?Principal Problem: ?  Hyperglycemia ?Active Problems: ?  Obesity, Class III, BMI 40-49.9 (morbid obesity) ([place]) ?  Hyponatremia ?  Pulmonary emboli ([place]) ?  HLD (hyperlipidemia) ?  Hx of bacterial endocarditis ?  Normocytic anemia ?  Hx of right BKA ([place]) ?  Pyuria ? ? ? ?Discharge instructions   ? ?Discharge Instructions   ? ? Discharge instructions   Complete by: As directed ?  ? Follow with Primary MD Pablo Lawrence, NP in 2-3 days  ? ?Get CBC, CMP, TSH, CBGs, follow blood culture results which were drawn this admission in the next 2 to 3 days  ? ?Activity: As tolerated with Full fall precautions use walker/cane & assistance as needed ? ?Disposition Home   ? ?Diet: Heart Healthy Low Carb ? ?Accuchecks 4 times/day, Once in AM empty stomach and then before each meal. ?Log in all results and  show them to your Prim.MD in 3 days. ?If any glucose reading is under 80 or above 300 call your Prim MD immidiately. ?Follow Low glucose instructions for glucose under 80 as instructed. ? ? ?Special Instructions: If you have smoked or chewed Tobacco  in the last 2 yrs please stop smoking, stop any regular Alcohol  and or any Recreational drug use. ? ?On your next visit with your primary care physician please Get Medicines reviewed and adjusted. ? ?Please request your Prim.MD to go over all Hospital Tests and Procedure/Radiological results at the follow up, please get all Hospital records sent to your Prim MD by signing hospital release before you go home. ? ?If you experience worsening of your admission symptoms, develop shortness of breath, life threatening emergency, suicidal or homicidal thoughts you must seek medical attention immediately by calling 911 or calling your MD immediately  if symptoms less severe. ? ?You Must read complete instructions/literature along with all the possible adverse reactions/side effects for all the Medicines you take and that have been prescribed to you. Take any new Medicines after you have completely understood and accpet all the possible adverse reactions/side effects.  ? Increase activity slowly   Complete by: As directed ?  ? ?  ? ? ?Discharge Medications  ? ?Allergies as of 10/25/2021   ? ?   Reactions  ? Clindamycin Nausea And Vomiting  ? Zofran Itching, Nausea And Vomiting  ? Doxycycline Nausea And Vomiting  ? Morphine And Related Itching  ? ?  ? ?  ?Medication List  ?  ? ?STOP taking these medications   ? ?ibuprofen 200 MG tablet ?Commonly known as: ADVIL ?  ? ?  ? ?TAKE these medications   ? ?acetaminophen 325 MG tablet ?Commonly known as: TYLENOL ?Take 2 tablets (650 mg total) by mouth every 6 (six) hours as needed for mild pain or moderate pain. ?  ?atorvastatin 40 MG tablet ?Commonly known as: LIPITOR ?Take 1 tablet (40 mg total) by mouth daily at 6 PM. ?  ?BIOTIN  PO ?Take 2 capsules  by mouth daily. Unsure of dose ?  ?buPROPion ER 100 MG 12 hr tablet ?Commonly known as: WELLBUTRIN SR ?Take 100 mg by mouth every morning. ?  ?collagenase 250 UNIT/GM ointment ?Commonly known as: SANTYL ?Apply topically daily. To left leg ?  ?diazepam 5 MG tablet ?Commonly known as: VALIUM ?Take 5 mg by mouth every 8 (eight) hours as needed for anxiety. ?  ?furosemide 40 MG tablet ?Commonly known as: LASIX ?Take 40 mg by mouth daily. ?  ?glucose blood test strip ?Use as instructed ?  ?glucose monitoring kit monitoring kit ?1 each by Does not apply route as needed for other. ?  ?HumaLOG KwikPen 100 UNIT/ML KwikPen ?Generic drug: insulin lispro ?0-12 Units 3 (three) times daily. Inject subcutaneously twice daily as per sliding scale: if 70-150= 0 units; 151-200= 2 u; 201-250= 4 u; 251-300= 6 u; 301-350= 8 u; 351-400= 10 u; 401-450= 12u; anything greater than 400 call physician. ?  ?hydrOXYzine 50 MG capsule ?Commonly known as: VISTARIL ?Take 50 mg by mouth every 8 (eight) hours as needed for itching. ?  ?levothyroxine 50 MCG tablet ?Commonly known as: SYNTHROID ?Take 1 tablet (50 mcg total) by mouth daily at 6 (six) AM. ?Start taking on: October 26, 2021 ?  ?metoCLOPramide 5 MG tablet ?Commonly known as: REGLAN ?Take 1 tablet (5 mg total) by mouth every 6 (six) hours as needed for nausea. ?  ?metoprolol tartrate 25 MG tablet ?Commonly known as: LOPRESSOR ?Take 0.5 tablets (12.5 mg total) by mouth 2 (two) times daily. ?  ?oxyCODONE 15 MG immediate release tablet ?Commonly known as: ROXICODONE ?Take 1 tablet (15 mg total) by mouth every 6 (six) hours as needed for pain. ?  ?potassium chloride 10 MEQ tablet ?Commonly known as: KLOR-CON ?Take 10 mEq by mouth daily. ?  ?promethazine 25 MG tablet ?Commonly known as: PHENERGAN ?Take 25 mg by mouth 2 (two) times daily as needed for nausea/vomiting or nausea. ?  ?Semglee (yfgn) 100 UNIT/ML Solostar Pen ?Generic drug: insulin glargine ?Inject 50 Units into the  skin at bedtime. ?  ?traZODone 150 MG tablet ?Commonly known as: DESYREL ?Take 150 mg by mouth at bedtime. ?  ?Xarelto 20 MG Tabs tablet ?Generic drug: rivaroxaban ?Take 20 mg by mouth every evening. ?  ? ?  ? ?

## 2021-10-25 NOTE — Telephone Encounter (Signed)
Patient currently admitted at [facility] hospital and it looks like they have removed her port.  ?

## 2021-10-25 NOTE — Care Management Obs Status (Signed)
MEDICARE OBSERVATION STATUS NOTIFICATION ? ? ?Patient Details  ?Name: Karen Bennett ?MRN: [id number] ?Date of Birth: [birth year] ? ? ?Medicare Observation Status Notification Given:  Yes ? ? ? ?Oletta Cohn, RN ?10/25/2021, 12:10 PM ?

## 2021-10-26 LAB — URINE CULTURE: Culture: >=100000 — AB

## 2021-10-29 LAB — CULTURE, BLOOD (SINGLE)
Culture: NO GROWTH
Special Requests: ADEQUATE

## 2021-10-29 LAB — CULTURE, BLOOD (ROUTINE X 2)
Culture: NO GROWTH
Special Requests: ADEQUATE

## 2022-02-20 ENCOUNTER — Inpatient Hospital Stay (HOSPITAL_COMMUNITY)
Admission: EM | Admit: 2022-02-20 | Discharge: 2022-02-23 | DRG: 603 | Disposition: A | Discharge status: Home / Self Care | Payer: Medicare Other | Attending: Internal Medicine | Admitting: Internal Medicine

## 2022-02-20 ENCOUNTER — Emergency Department (HOSPITAL_COMMUNITY): Payer: Medicare Other

## 2022-02-20 ENCOUNTER — Other Ambulatory Visit: Payer: Self-pay

## 2022-02-20 ENCOUNTER — Encounter (HOSPITAL_COMMUNITY): Payer: Self-pay

## 2022-02-20 DIAGNOSIS — Z79890 Hormone replacement therapy (postmenopausal): Secondary | ICD-10-CM

## 2022-02-20 DIAGNOSIS — Z833 Family history of diabetes mellitus: Secondary | ICD-10-CM

## 2022-02-20 DIAGNOSIS — Z86711 Personal history of pulmonary embolism: Secondary | ICD-10-CM

## 2022-02-20 DIAGNOSIS — Z794 Long term (current) use of insulin: Secondary | ICD-10-CM

## 2022-02-20 DIAGNOSIS — Z6841 Body Mass Index (BMI) 40.0 and over, adult: Secondary | ICD-10-CM

## 2022-02-20 DIAGNOSIS — I10 Essential (primary) hypertension: Secondary | ICD-10-CM | POA: Diagnosis present

## 2022-02-20 DIAGNOSIS — B957 Other staphylococcus as the cause of diseases classified elsewhere: Secondary | ICD-10-CM | POA: Diagnosis present

## 2022-02-20 DIAGNOSIS — E871 Hypo-osmolality and hyponatremia: Secondary | ICD-10-CM

## 2022-02-20 DIAGNOSIS — F419 Anxiety disorder, unspecified: Secondary | ICD-10-CM | POA: Diagnosis present

## 2022-02-20 DIAGNOSIS — F32A Depression, unspecified: Secondary | ICD-10-CM | POA: Diagnosis present

## 2022-02-20 DIAGNOSIS — Z888 Allergy status to other drugs, medicaments and biological substances status: Secondary | ICD-10-CM

## 2022-02-20 DIAGNOSIS — Z79899 Other long term (current) drug therapy: Secondary | ICD-10-CM

## 2022-02-20 DIAGNOSIS — G8929 Other chronic pain: Secondary | ICD-10-CM | POA: Diagnosis present

## 2022-02-20 DIAGNOSIS — N1831 Chronic kidney disease, stage 3a: Secondary | ICD-10-CM | POA: Diagnosis present

## 2022-02-20 DIAGNOSIS — Z8781 Personal history of (healed) traumatic fracture: Secondary | ICD-10-CM

## 2022-02-20 DIAGNOSIS — N179 Acute kidney failure, unspecified: Secondary | ICD-10-CM | POA: Diagnosis present

## 2022-02-20 DIAGNOSIS — L03115 Cellulitis of right lower limb: Secondary | ICD-10-CM | POA: Diagnosis not present

## 2022-02-20 DIAGNOSIS — L0390 Cellulitis, unspecified: Secondary | ICD-10-CM | POA: Diagnosis not present

## 2022-02-20 DIAGNOSIS — Z823 Family history of stroke: Secondary | ICD-10-CM

## 2022-02-20 DIAGNOSIS — Z7901 Long term (current) use of anticoagulants: Secondary | ICD-10-CM

## 2022-02-20 DIAGNOSIS — N184 Chronic kidney disease, stage 4 (severe): Secondary | ICD-10-CM | POA: Diagnosis present

## 2022-02-20 DIAGNOSIS — Z8249 Family history of ischemic heart disease and other diseases of the circulatory system: Secondary | ICD-10-CM

## 2022-02-20 DIAGNOSIS — E039 Hypothyroidism, unspecified: Secondary | ICD-10-CM

## 2022-02-20 DIAGNOSIS — E1165 Type 2 diabetes mellitus with hyperglycemia: Secondary | ICD-10-CM | POA: Diagnosis present

## 2022-02-20 DIAGNOSIS — E46 Unspecified protein-calorie malnutrition: Secondary | ICD-10-CM | POA: Diagnosis present

## 2022-02-20 DIAGNOSIS — M79604 Pain in right leg: Secondary | ICD-10-CM

## 2022-02-20 DIAGNOSIS — E119 Type 2 diabetes mellitus without complications: Secondary | ICD-10-CM

## 2022-02-20 DIAGNOSIS — Z7401 Bed confinement status: Secondary | ICD-10-CM

## 2022-02-20 DIAGNOSIS — R7881 Bacteremia: Secondary | ICD-10-CM

## 2022-02-20 DIAGNOSIS — Z91148 Patient's other noncompliance with medication regimen for other reason: Secondary | ICD-10-CM

## 2022-02-20 DIAGNOSIS — Z89511 Acquired absence of right leg below knee: Secondary | ICD-10-CM

## 2022-02-20 DIAGNOSIS — Z885 Allergy status to narcotic agent status: Secondary | ICD-10-CM

## 2022-02-20 LAB — CBC WITH DIFFERENTIAL/PLATELET
Abs Immature Granulocytes: 0.06 10*3/uL (ref 0.00–0.07)
Basophils Absolute: 0 10*3/uL (ref 0.0–0.1)
Basophils Relative: 0 %
Eosinophils Absolute: 0.1 10*3/uL (ref 0.0–0.5)
Eosinophils Relative: 1 %
HCT: 40.4 % (ref 36.0–46.0)
Hemoglobin: 12.6 g/dL (ref 12.0–15.0)
Immature Granulocytes: 1 %
Lymphocytes Relative: 23 %
Lymphs Abs: 2.5 10*3/uL (ref 0.7–4.0)
MCH: 28.3 pg (ref 26.0–34.0)
MCHC: 31.2 g/dL (ref 30.0–36.0)
MCV: 90.8 fL (ref 80.0–100.0)
Monocytes Absolute: 0.6 10*3/uL (ref 0.1–1.0)
Monocytes Relative: 6 %
Neutro Abs: 7.4 10*3/uL (ref 1.7–7.7)
Neutrophils Relative %: 69 %
Platelets: 420 10*3/uL — ABNORMAL HIGH (ref 150–400)
RBC: 4.45 MIL/uL (ref 3.87–5.11)
RDW: 13.6 % (ref 11.5–15.5)
WBC: 10.8 10*3/uL — ABNORMAL HIGH (ref 4.0–10.5)
nRBC: 0 % (ref 0.0–0.2)

## 2022-02-20 LAB — COMPREHENSIVE METABOLIC PANEL
ALT: 9 U/L (ref 0–44)
AST: 10 U/L — ABNORMAL LOW (ref 15–41)
Albumin: 1.7 g/dL — ABNORMAL LOW (ref 3.5–5.0)
Alkaline Phosphatase: 146 U/L — ABNORMAL HIGH (ref 38–126)
Anion gap: 10 (ref 5–15)
BUN: 16 mg/dL (ref 6–20)
CO2: 20 mmol/L — ABNORMAL LOW (ref 22–32)
Calcium: 8 mg/dL — ABNORMAL LOW (ref 8.9–10.3)
Chloride: 100 mmol/L (ref 98–111)
Creatinine, Ser: 1.07 mg/dL — ABNORMAL HIGH (ref 0.44–1.00)
GFR, Estimated: >60 mL/min (ref >60)
Glucose, Bld: 363 mg/dL — ABNORMAL HIGH (ref 70–99)
Potassium: 4.3 mmol/L (ref 3.5–5.1)
Sodium: 130 mmol/L — ABNORMAL LOW (ref 135–145)
Total Bilirubin: 0.4 mg/dL (ref 0.3–1.2)
Total Protein: 7.7 g/dL (ref 6.5–8.1)

## 2022-02-20 LAB — LACTIC ACID, PLASMA: Lactic Acid, Venous: 1.6 mmol/L (ref 0.5–1.9)

## 2022-02-20 LAB — TROPONIN I (HIGH SENSITIVITY): Troponin I (High Sensitivity): 6 ng/L (ref <18)

## 2022-02-20 MED ORDER — OXYCODONE-ACETAMINOPHEN 5-325 MG PO TABS
1.0000 | ORAL_TABLET | Freq: Once | ORAL | Status: AC
  Administered 2022-02-20: 1 via ORAL
  Filled 2022-02-20: qty 1

## 2022-02-20 MED ORDER — OXYCODONE HCL 5 MG PO TABS
15.0000 mg | ORAL_TABLET | Freq: Two times a day (BID) | ORAL | Status: DC | PRN
  Administered 2022-02-21: 15 mg via ORAL
  Filled 2022-02-20 (×2): qty 3

## 2022-02-20 MED ORDER — HYDROMORPHONE HCL 1 MG/ML IJ SOLN
0.5000 mg | Freq: Once | INTRAMUSCULAR | Status: AC
  Administered 2022-02-20: 0.5 mg via INTRAVENOUS
  Filled 2022-02-20: qty 1

## 2022-02-20 MED ORDER — DIAZEPAM 5 MG PO TABS: 5.0000 mg | ORAL_TABLET | Freq: Three times a day (TID) | ORAL | Status: DC | PRN

## 2022-02-20 NOTE — ED Triage Notes (Signed)
Per EMS- patient has a right BKA. Patient states she has had pain from the right knee to the right hip. No redness or wounds to the right leg.  EMS [id number].

## 2022-02-20 NOTE — ED Notes (Signed)
Pt pain is 9 out of 10 prior to pain medication administration.

## 2022-02-20 NOTE — ED Provider Triage Note (Signed)
Emergency Medicine Provider Triage Evaluation Note  Karen Bennett , a 46 y.o. female  was evaluated in triage.  Pt complains of swelling in the right stump.  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  She reports her chronic pain in the leg has worsened and since the weekend it has swollen significantly.   No fevers, no redness per patient.   She is non weight brearing on the leg.   She reports that other than pain and nausea she is feeling ok.   Physical Exam  BP 123/88 (BP Location: Left Arm)   Pulse (!) 111   Temp 98.8 F (37.1 C) (Oral)   Resp 20   Ht 5\' 6"  (1.676 m)   Wt (!) 190.5 kg   LMP 04/13/2013   SpO2 99%   BMI 67.79 kg/m  Gen:   Awake, no distress   Resp:  Normal effort  MSK:   Moves extremities without difficulty  Other:  Normal speech.   Medical Decision Making  Medically screening exam initiated at 7:07 PM.  Appropriate orders placed.  Karen Bennett was informed that the remainder of the evaluation will be completed by another provider, this initial triage assessment does not replace that evaluation, and the importance of remaining in the ED until their evaluation is complete.     04/15/2013, Cristina Gong 02/20/22 1921

## 2022-02-21 ENCOUNTER — Emergency Department (HOSPITAL_COMMUNITY): Payer: Medicare Other

## 2022-02-21 DIAGNOSIS — E119 Type 2 diabetes mellitus without complications: Secondary | ICD-10-CM | POA: Diagnosis not present

## 2022-02-21 DIAGNOSIS — E039 Hypothyroidism, unspecified: Secondary | ICD-10-CM

## 2022-02-21 DIAGNOSIS — M79604 Pain in right leg: Secondary | ICD-10-CM | POA: Diagnosis not present

## 2022-02-21 DIAGNOSIS — Z794 Long term (current) use of insulin: Secondary | ICD-10-CM | POA: Diagnosis not present

## 2022-02-21 LAB — BASIC METABOLIC PANEL
Anion gap: 8 (ref 5–15)
BUN: 19 mg/dL (ref 6–20)
CO2: 22 mmol/L (ref 22–32)
Calcium: 8 mg/dL — ABNORMAL LOW (ref 8.9–10.3)
Chloride: 102 mmol/L (ref 98–111)
Creatinine, Ser: 1.37 mg/dL — ABNORMAL HIGH (ref 0.44–1.00)
GFR, Estimated: 49 mL/min — ABNORMAL LOW (ref >60)
Glucose, Bld: 139 mg/dL — ABNORMAL HIGH (ref 70–99)
Potassium: 4.3 mmol/L (ref 3.5–5.1)
Sodium: 132 mmol/L — ABNORMAL LOW (ref 135–145)

## 2022-02-21 LAB — BLOOD CULTURE ID PANEL (REFLEXED) - BCID2
A.calcoaceticus-baumannii: NOT DETECTED
Bacteroides fragilis: NOT DETECTED
Candida albicans: NOT DETECTED
Candida auris: NOT DETECTED
Candida glabrata: NOT DETECTED
Candida krusei: NOT DETECTED
Candida parapsilosis: NOT DETECTED
Candida tropicalis: NOT DETECTED
Cryptococcus neoformans/gattii: NOT DETECTED
Enterobacter cloacae complex: NOT DETECTED
Enterobacterales: NOT DETECTED
Enterococcus Faecium: NOT DETECTED
Enterococcus faecalis: NOT DETECTED
Escherichia coli: NOT DETECTED
Haemophilus influenzae: NOT DETECTED
Klebsiella aerogenes: NOT DETECTED
Klebsiella oxytoca: NOT DETECTED
Klebsiella pneumoniae: NOT DETECTED
Listeria monocytogenes: NOT DETECTED
Methicillin resistance mecA/C: DETECTED — AB
Neisseria meningitidis: NOT DETECTED
Proteus species: NOT DETECTED
Pseudomonas aeruginosa: NOT DETECTED
Salmonella species: NOT DETECTED
Serratia marcescens: NOT DETECTED
Staphylococcus aureus (BCID): NOT DETECTED
Staphylococcus epidermidis: NOT DETECTED
Staphylococcus lugdunensis: DETECTED — AB
Staphylococcus species: DETECTED — AB
Stenotrophomonas maltophilia: NOT DETECTED
Streptococcus agalactiae: NOT DETECTED
Streptococcus pneumoniae: NOT DETECTED
Streptococcus pyogenes: NOT DETECTED
Streptococcus species: NOT DETECTED

## 2022-02-21 LAB — CBG MONITORING, ED
Glucose-Capillary: 298 mg/dL — ABNORMAL HIGH (ref 70–99)
Glucose-Capillary: 277 mg/dL — ABNORMAL HIGH (ref 70–99)
Glucose-Capillary: 214 mg/dL — ABNORMAL HIGH (ref 70–99)
Glucose-Capillary: 168 mg/dL — ABNORMAL HIGH (ref 70–99)

## 2022-02-21 LAB — CBC
HCT: 34.8 % — ABNORMAL LOW (ref 36.0–46.0)
Hemoglobin: 11.4 g/dL — ABNORMAL LOW (ref 12.0–15.0)
MCH: 28.8 pg (ref 26.0–34.0)
MCHC: 32.8 g/dL (ref 30.0–36.0)
MCV: 87.9 fL (ref 80.0–100.0)
Platelets: 408 10*3/uL — ABNORMAL HIGH (ref 150–400)
RBC: 3.96 MIL/uL (ref 3.87–5.11)
RDW: 13.6 % (ref 11.5–15.5)
WBC: 12.8 10*3/uL — ABNORMAL HIGH (ref 4.0–10.5)
nRBC: 0 % (ref 0.0–0.2)

## 2022-02-21 LAB — GLUCOSE, CAPILLARY: Glucose-Capillary: 202 mg/dL — ABNORMAL HIGH (ref 70–99)

## 2022-02-21 LAB — TROPONIN I (HIGH SENSITIVITY): Troponin I (High Sensitivity): 6 ng/L (ref <18)

## 2022-02-21 LAB — LACTIC ACID, PLASMA: Lactic Acid, Venous: 1.4 mmol/L (ref 0.5–1.9)

## 2022-02-21 LAB — HEMOGLOBIN A1C
Hgb A1c MFr Bld: 12.2 % — ABNORMAL HIGH (ref 4.8–5.6)
Mean Plasma Glucose: 303.44 mg/dL

## 2022-02-21 MED ORDER — VANCOMYCIN HCL 2000 MG/400ML IV SOLN
2000.0000 mg | INTRAVENOUS | Status: AC
  Administered 2022-02-21: 2000 mg via INTRAVENOUS
  Filled 2022-02-21 (×4): qty 400

## 2022-02-21 MED ORDER — DIAZEPAM 5 MG PO TABS: 5.0000 mg | ORAL_TABLET | Freq: Three times a day (TID) | ORAL | Status: DC | PRN

## 2022-02-21 MED ORDER — LORAZEPAM 2 MG/ML IJ SOLN: 1.0000 mg | Freq: Once | INTRAMUSCULAR | Status: DC | PRN

## 2022-02-21 MED ORDER — KETOROLAC TROMETHAMINE 15 MG/ML IJ SOLN
15.0000 mg | Freq: Once | INTRAMUSCULAR | Status: AC
  Administered 2022-02-21: 15 mg via INTRAVENOUS
  Filled 2022-02-21: qty 1

## 2022-02-21 MED ORDER — ATORVASTATIN CALCIUM 40 MG PO TABS
40.0000 mg | ORAL_TABLET | Freq: Every day | ORAL | Status: DC
  Administered 2022-02-21 – 2022-02-22 (×2): 40 mg via ORAL
  Filled 2022-02-21 (×2): qty 1

## 2022-02-21 MED ORDER — INSULIN GLARGINE-YFGN 100 UNIT/ML ~~LOC~~ SOLN
40.0000 [IU] | Freq: Every day | SUBCUTANEOUS | Status: DC
  Administered 2022-02-21 – 2022-02-22 (×2): 40 [IU] via SUBCUTANEOUS
  Filled 2022-02-21 (×3): qty 0.4

## 2022-02-21 MED ORDER — OXYCODONE HCL 5 MG PO TABS
15.0000 mg | ORAL_TABLET | Freq: Four times a day (QID) | ORAL | Status: DC | PRN
Start: 2022-02-21 — End: 2022-02-21

## 2022-02-21 MED ORDER — CEFAZOLIN SODIUM-DEXTROSE 2-4 GM/100ML-% IV SOLN
2.0000 g | Freq: Three times a day (TID) | INTRAVENOUS | Status: DC
Start: 2022-02-21 — End: 2022-02-21

## 2022-02-21 MED ORDER — INSULIN ASPART 100 UNIT/ML IJ SOLN
0.0000 [IU] | Freq: Every day | INTRAMUSCULAR | Status: DC
  Administered 2022-02-21 – 2022-02-22 (×2): 2 [IU] via SUBCUTANEOUS
  Filled 2022-02-21: qty 0.05

## 2022-02-21 MED ORDER — FENTANYL CITRATE PF 50 MCG/ML IJ SOSY
100.0000 ug | PREFILLED_SYRINGE | Freq: Once | INTRAMUSCULAR | Status: AC
  Administered 2022-02-21: 100 ug via INTRAVENOUS
  Filled 2022-02-21: qty 2

## 2022-02-21 MED ORDER — ACETAMINOPHEN 325 MG PO TABS
650.0000 mg | ORAL_TABLET | Freq: Four times a day (QID) | ORAL | Status: DC | PRN
  Administered 2022-02-22 – 2022-02-23 (×2): 650 mg via ORAL
  Filled 2022-02-21 (×2): qty 2

## 2022-02-21 MED ORDER — PROMETHAZINE HCL 25 MG PO TABS: 12.5000 mg | ORAL_TABLET | Freq: Four times a day (QID) | ORAL | Status: DC | PRN

## 2022-02-21 MED ORDER — VANCOMYCIN HCL 1250 MG/250ML IV SOLN
1250.0000 mg | Freq: Two times a day (BID) | INTRAVENOUS | Status: DC
  Administered 2022-02-22 – 2022-02-23 (×3): 1250 mg via INTRAVENOUS
  Filled 2022-02-21 (×3): qty 250

## 2022-02-21 MED ORDER — TRAZODONE HCL 50 MG PO TABS
150.0000 mg | ORAL_TABLET | Freq: Every day | ORAL | Status: DC
  Administered 2022-02-21 – 2022-02-22 (×2): 150 mg via ORAL
  Filled 2022-02-21 (×3): qty 1

## 2022-02-21 MED ORDER — INSULIN LISPRO (1 UNIT DIAL) 100 UNIT/ML (KWIKPEN): 0.0000 [IU] | PEN_INJECTOR | Freq: Three times a day (TID) | SUBCUTANEOUS | Status: DC

## 2022-02-21 MED ORDER — CEFAZOLIN SODIUM-DEXTROSE 2-4 GM/100ML-% IV SOLN
2.0000 g | Freq: Once | INTRAVENOUS | Status: AC
  Administered 2022-02-21: 2 g via INTRAVENOUS
  Filled 2022-02-21: qty 100

## 2022-02-21 MED ORDER — POLYETHYLENE GLYCOL 3350 17 G PO PACK: 17.0000 g | PACK | Freq: Every day | ORAL | Status: DC | PRN

## 2022-02-21 MED ORDER — LORAZEPAM 2 MG/ML IJ SOLN
1.0000 mg | Freq: Once | INTRAMUSCULAR | Status: AC | PRN
  Administered 2022-02-21: 1 mg via INTRAVENOUS
  Filled 2022-02-21: qty 1

## 2022-02-21 MED ORDER — RIVAROXABAN 10 MG PO TABS
20.0000 mg | ORAL_TABLET | Freq: Every day | ORAL | Status: DC
  Administered 2022-02-21 – 2022-02-22 (×2): 20 mg via ORAL
  Filled 2022-02-21 (×2): qty 2

## 2022-02-21 MED ORDER — INSULIN GLARGINE-YFGN 100 UNIT/ML ~~LOC~~ SOLN: 50.0000 [IU] | Freq: Every day | SUBCUTANEOUS | Status: DC

## 2022-02-21 MED ORDER — INSULIN ASPART 100 UNIT/ML IJ SOLN
0.0000 [IU] | Freq: Three times a day (TID) | INTRAMUSCULAR | Status: DC
  Administered 2022-02-21: 5 [IU] via SUBCUTANEOUS
  Administered 2022-02-21: 8 [IU] via SUBCUTANEOUS
  Administered 2022-02-22 (×3): 5 [IU] via SUBCUTANEOUS
  Administered 2022-02-23 (×2): 3 [IU] via SUBCUTANEOUS
  Filled 2022-02-21: qty 0.15

## 2022-02-21 MED ORDER — OXYCODONE HCL 5 MG PO TABS
10.0000 mg | ORAL_TABLET | Freq: Four times a day (QID) | ORAL | Status: DC | PRN
  Administered 2022-02-21 – 2022-02-22 (×3): 10 mg via ORAL
  Filled 2022-02-21 (×2): qty 2

## 2022-02-21 MED ORDER — BUPROPION HCL ER (SR) 100 MG PO TB12
100.0000 mg | ORAL_TABLET | Freq: Every morning | ORAL | Status: DC
  Administered 2022-02-22 – 2022-02-23 (×2): 100 mg via ORAL
  Filled 2022-02-21 (×2): qty 1

## 2022-02-21 MED ORDER — METOCLOPRAMIDE HCL 5 MG PO TABS: 5.0000 mg | ORAL_TABLET | Freq: Four times a day (QID) | ORAL | Status: DC | PRN

## 2022-02-21 MED ORDER — HYDROXYZINE HCL 50 MG PO TABS: 50.0000 mg | ORAL_TABLET | Freq: Three times a day (TID) | ORAL | Status: DC | PRN

## 2022-02-21 MED ORDER — LEVOTHYROXINE SODIUM 50 MCG PO TABS
50.0000 ug | ORAL_TABLET | Freq: Every day | ORAL | Status: DC
  Administered 2022-02-22 – 2022-02-23 (×2): 50 ug via ORAL
  Filled 2022-02-21 (×2): qty 1

## 2022-02-21 MED ORDER — METOPROLOL TARTRATE 12.5 MG HALF TABLET: 12.5000 mg | ORAL_TABLET | Freq: Two times a day (BID) | ORAL | Status: DC

## 2022-02-21 NOTE — ED Provider Notes (Signed)
  Care of patient assumed from PA Anmoore at 0630.  Agree with history, physical exam and plan.  See their note for further details. Briefly, 46 year old female presents emergency department with a complaint of pain and swelling to right BKA stump.  Patient reports that chronic pain is managed by her PCP for which she takes 15 mg oxycodone twice daily but ran out of this 1 week ago.  Pain worsened 3 days ago and has been constant.   Physical Exam  BP (!) 124/57 (BP Location: Left Arm)   Pulse 85   Temp 98 F (36.7 C) (Oral)   Resp 16   Ht 5\' 6"  (1.676 m)   Wt (!) 190.5 kg   LMP 04/13/2013   SpO2 98%   BMI 67.79 kg/m   Physical Exam Vitals and nursing note reviewed.  Constitutional:      General: She is not in acute distress.    Appearance: She is morbidly obese. She is not ill-appearing, toxic-appearing or diaphoretic.  HENT:     Head: Normocephalic.  Eyes:     General: No scleral icterus.       Right eye: No discharge.        Left eye: No discharge.  Cardiovascular:     Rate and Rhythm: Normal rate.  Pulmonary:     Effort: Pulmonary effort is normal.  Musculoskeletal:     Comments: Right BKA stump with mild erythema.     Right Lower Extremity: Right leg is amputated below ankle.  Skin:    General: Skin is warm and dry.  Neurological:     General: No focal deficit present.     Mental Status: She is alert.  Psychiatric:        Behavior: Behavior is cooperative.     Procedures  Procedures  ED Course / MDM     Medical Decision Making Amount and/or Complexity of Data Reviewed Radiology: ordered.  Risk Prescription drug management. Decision regarding hospitalization.   Chronic pain R BKA stump; feels more swollen  Mild erythema bass of stump;  MRI to check for osteo  MRI imaging shows diffuse subcutaneous soft tissue swelling/edema/fluid consistent with severe cellulitis.  No discrete drainable fluid collections or subcutaneous abscess.  No findings for  osteomyelitis involving the tibial or fibular stump or surgical thyroid at the knee joint.  Due to patient's leukocytosis, erythema, and history of neck rash will start patient on Ancef and consult hospitalist admission.  I spoke to hospitalist Dr. 04/15/2013 who will see the patient for admission.       Alois Cliche, PA-C 02/21/22 1427    04/23/22, MD 02/21/22 1505

## 2022-02-21 NOTE — Plan of Care (Signed)
Plan of care reviewed and discussed. °

## 2022-02-21 NOTE — Progress Notes (Addendum)
Inpatient Diabetes Program Recommendations  AACE/ADA: New Consensus Statement on Inpatient Glycemic Control (2015)  Target Ranges:  Prepandial:   less than 140 mg/dL      Peak postprandial:   less than 180 mg/dL (1-2 hours)      Critically ill patients:  140 - 180 mg/dL   Lab Results  Component Value Date   GLUCAP 168 (H) 02/21/2022   HGBA1C 14.6 (H) 09/03/2021    Review of Glycemic Control  Latest Reference Range & Units 02/21/22 07:20 02/21/22 08:07 02/21/22 10:39 02/21/22 11:57  Glucose-Capillary 70 - 99 mg/dL 202 (H) 542 (H) 706 (H) 168 (H)   Diabetes history: DM 2 Outpatient Diabetes medications:  Humalog 0-12 units tid with meals Semglee 50 units q HS Current orders for Inpatient glycemic control:  Novolog moderate tid with meals and HS  Inpatient Diabetes Program Recommendations:    May consider adding 1/2 of home dose of basal insulin.  Consider adding Semglee 25 units daily.  Also recommend recheck of A1C as it was markedly increased in February 2023.   Thanks,  Beryl Meager, RN, BC-ADM Inpatient Diabetes Coordinator Pager [phone]  (8a-5p)

## 2022-02-21 NOTE — ED Notes (Signed)
Patient transported to MRI 

## 2022-02-21 NOTE — Progress Notes (Signed)
Pharmacy Antibiotic Note  Karen Bennett is a 46 y.o. female admitted on 02/20/2022 with cellulitis of RLE stump. PMH significant for endocarditis and candida glabrata fungemia.   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 Pharmacy has been consulted for vancomycin dosing.  Today, 02/21/22 -WBC slightly elevated -Afebrile  Plan: Vancomycin 2000 mg IV LD followed by 1250 mg IV q12h for estimated AUC of 465 Goal vancomycin AUC is 400-550. Check levels at steady state as needed. Monitor renal function, culture data.   Height: 5\' 6"  (167.6 cm) Weight: (!) 190.5 kg (420 lb) IBW/kg (Calculated) : 59.3  Temp (24hrs), Avg:98.3 F (36.8 C), Min:97.9 F (36.6 C), Max:98.9 F (37.2 C)  Recent Labs  Lab 02/20/22 1940 02/20/22 2352  WBC 10.8*  --   CREATININE 1.07*  --   LATICACIDVEN 1.6 1.4    Estimated Creatinine Clearance: 118.4 mL/min (A) (by C-G formula based on SCr of 1.07 mg/dL (H)).    Allergies  Allergen Reactions   Clindamycin Diarrhea and Nausea And Vomiting   Zofran Itching and Nausea And Vomiting   Doxycycline Diarrhea and Nausea And Vomiting   Morphine And Related Hives, Itching and Rash    Antimicrobials this admission: vancomycin 8/1 >>  Cefazolin x1 on 8/1  Dose adjustments this admission:  Microbiology results: 7/31 BCx: 1/4 GPR, 2/4 staph lugdunensis, mecA positive  8/31, PharmD 02/21/2022 3:35 PM

## 2022-02-21 NOTE — ED Provider Notes (Signed)
[facility] DEPT Provider Note   CSN: [id number] Arrival date & time: 02/20/22  1828     History  Chief Complaint  Patient presents with   Leg Pain   Leg Swelling    Karen Bennett is a 46 y.o. female.  46 year old female with a complex past medical history presents to the emergency department for evaluation of pain to her right lower extremity and BKA stump.  She has not been followed by orthopedics for this issue since 2021.  Reports that her chronic pain is managed by her primary care doctor.  She takes 15 mg oxycodone BID, but ran out of this Rx 1 week ago.  Pain worsened 3 days ago and has been constant.  She feels as though her right BKA stump is more swollen.  Denies fevers, V/D, sensation changes.  The history is provided by the patient. No language interpreter was used.  Leg Pain      Home Medications Prior to Admission medications   Medication Sig Start Date End Date Taking? Authorizing Provider  acetaminophen (TYLENOL) 325 MG tablet Take 2 tablets (650 mg total) by mouth every 6 (six) hours as needed for mild pain or moderate pain. 02/09/21   Annita Brod, MD  atorvastatin (LIPITOR) 40 MG tablet Take 1 tablet (40 mg total) by mouth daily at 6 PM. 05/12/13   Buriev, Arie Sabina, MD  BIOTIN PO Take 2 capsules by mouth daily. Unsure of dose    [provider]  buPROPion ER (WELLBUTRIN SR) 100 MG 12 hr tablet Take 100 mg by mouth every morning. 05/09/21   [provider]  collagenase (SANTYL) ointment Apply topically daily. To left leg Patient not taking: Reported on 09/03/2021 01/04/21   Domenic Polite, MD  diazepam (VALIUM) 5 MG tablet Take 5 mg by mouth every 8 (eight) hours as needed for anxiety. 03/30/21   [provider]  furosemide (LASIX) 40 MG tablet Take 40 mg by mouth daily. 11/12/20   [provider]  glucose blood test strip Use as instructed 06/26/13   Advani, Vernon Prey, MD  glucose monitoring kit  (FREESTYLE) monitoring kit 1 each by Does not apply route as needed for other. 06/26/13   Lorayne Marek, MD  HUMALOG KWIKPEN 100 UNIT/ML KwikPen 0-12 Units 3 (three) times daily. Inject subcutaneously twice daily as per sliding scale: if 70-150= 0 units; 151-200= 2 u; 201-250= 4 u; 251-300= 6 u; 301-350= 8 u; 351-400= 10 u; 401-450= 12u; anything greater than 400 call physician. 10/27/20   [provider]  hydrOXYzine (VISTARIL) 50 MG capsule Take 50 mg by mouth every 8 (eight) hours as needed for itching.    [provider]  insulin glargine (SEMGLEE, YFGN,) 100 UNIT/ML Solostar Pen Inject 50 Units into the skin at bedtime. 03/30/21   [provider]  levothyroxine (SYNTHROID) 50 MCG tablet Take 1 tablet (50 mcg total) by mouth daily at 6 (six) AM. 10/26/21   Thurnell Lose, MD  metoCLOPramide (REGLAN) 5 MG tablet Take 1 tablet (5 mg total) by mouth every 6 (six) hours as needed for nausea. 02/09/21   Annita Brod, MD  metoprolol tartrate (LOPRESSOR) 25 MG tablet Take 0.5 tablets (12.5 mg total) by mouth 2 (two) times daily. 02/09/21   Annita Brod, MD  oxyCODONE (ROXICODONE) 15 MG immediate release tablet Take 1 tablet (15 mg total) by mouth every 6 (six) hours as needed for pain. 09/13/21   Oswald Hillock, MD  potassium chloride (KLOR-CON) 10 MEQ tablet Take 10 mEq by mouth daily. 05/09/21   [provider]  promethazine (PHENERGAN) 25 MG tablet Take 25 mg by mouth 2 (two) times daily as needed for nausea/vomiting or nausea. 01/12/21   [provider]  traZODone (DESYREL) 150 MG tablet Take 150 mg by mouth at bedtime. 03/30/21   [provider]  XARELTO 20 MG TABS tablet Take 20 mg by mouth every evening. 11/12/20   [provider]      Allergies    Clindamycin, Zofran, Doxycycline, and Morphine and related    Review of Systems   Review of Systems Ten systems reviewed and are negative for acute change, except as noted in the HPI.     Physical Exam Updated Vital Signs BP (!) 124/57 (BP Location: Left Arm)   Pulse 85   Temp 98 F (36.7 C) (Oral)   Resp 16   Ht _0  (1.676 m)   Wt (!) 190.5 kg   LMP 04/13/2013   SpO2 98%   BMI 67.79 kg/m   Physical Exam Vitals and nursing note reviewed.  Constitutional:      General: She is not in acute distress.    Appearance: She is well-developed. She is not diaphoretic.     Comments: Morbidly obese  HENT:     Head: Normocephalic and atraumatic.  Eyes:     General: No scleral icterus.    Conjunctiva/sclera: Conjunctivae normal.  Cardiovascular:     Rate and Rhythm: Regular rhythm. Tachycardia present.     Pulses: Normal pulses.  Pulmonary:     Effort: Pulmonary effort is normal. No respiratory distress.     Comments: Respirations even and unlabored Musculoskeletal:        General: Normal range of motion.     Cervical back: Normal range of motion.     Comments: Right BKA stump with mild erythema, but no significant heat to touch. No lymphangitic streaking. No dehiscence at stump site or bony deformity.  Skin:    General: Skin is warm and dry.     Coloration: Skin is not pale.     Findings: No rash.  Neurological:     Mental Status: She is alert and oriented to person, place, and time.     Coordination: Coordination normal.  Psychiatric:        Mood and Affect: Mood is anxious. Affect is tearful.        Behavior: Behavior normal.     ED Results / Procedures / Treatments   Labs (all labs ordered are listed, but only abnormal results are displayed) Labs Reviewed  COMPREHENSIVE METABOLIC PANEL - Abnormal; Notable for the following components:      Result Value   Sodium 130 (*)    CO2 20 (*)    Glucose, Bld 363 (*)    Creatinine, Ser 1.07 (*)    Calcium 8.0 (*)    Albumin 1.7 (*)    AST 10 (*)    Alkaline Phosphatase 146 (*)    All other components within normal limits  CBC WITH DIFFERENTIAL/PLATELET - Abnormal; Notable for the following components:    WBC 10.8 (*)    Platelets 420 (*)    All other components within normal limits  CULTURE, BLOOD (ROUTINE X 2)  CULTURE, BLOOD (ROUTINE X 2)  LACTIC ACID, PLASMA  LACTIC ACID, PLASMA  URINALYSIS, ROUTINE W REFLEX MICROSCOPIC  CBG MONITORING, ED  CBG MONITORING, ED  TROPONIN I (HIGH SENSITIVITY)  TROPONIN  I (HIGH SENSITIVITY)    EKG None  Radiology DG Femur Min 2 Views Right  Result Date: 02/20/2022 CLINICAL DATA:  Pain from the knee to the hip EXAM: RIGHT FEMUR 2 VIEWS COMPARISON:  03/19/2018 FINDINGS: Status post intramedullary rod and distal screw fixation of the femur. Comminuted incompletely united distal femoral fracture with residual 1/3 shaft diameter lateral displacement of femoral condyles with respect to the distal shaft of the femur. May be slight increased lateral displacement and angulation. There is fracture of the distal most fixating screw with surrounding lucency. IMPRESSION: Status post intramedullary rod and screw fixation of comminuted distal femoral fracture with largely chronic residual displacement of the distal femoral fracture fragment. Incomplete osseous bridging at the fracture with interval fracture of distal fixating screw in the femur. Generalized heterogeneous lucency within the distal femoral fracture fragment which could be due to osteopenia though correlate for any signs or symptoms of infection to the region. Electronically Signed   By: Donavan Foil M.D.   On: 02/20/2022 20:29   DG Knee 2 Views Right  Result Date: 02/20/2022 CLINICAL DATA:  Leg pain EXAM: RIGHT KNEE - 1-2 VIEW COMPARISON:  02/12/2018, 03/19/2018 FINDINGS: Patient is status post below the knee amputation. Partially visualized intramedullary rod and fixating screws in the femur. There is fracture of the distal most fixating screw with lucency surrounding the screw. About 1/3 shaft diameter lateral displacement of the femoral condyles with respect to the shaft of the femur, this appears to be  a chronic deformity when compared with 2019. Vascular calcifications. No sizable effusion. IMPRESSION: Status post below the knee amputation. Partially visualized intramedullary rod and screw fixation of distal femoral fracture with residual lateral displacement of the femoral condyles with respect to the shaft, this appears to be a chronic finding when compared to 2019 images. There is interval fracture of the distal most fixating screw at the femur with lucency surrounding the screw suggesting loosening. Electronically Signed   By: Donavan Foil M.D.   On: 02/20/2022 20:16    Procedures Procedures    Medications Ordered in ED Medications  oxyCODONE (Oxy IR/ROXICODONE) immediate release tablet 15 mg (has no administration in time range)  ketorolac (TORADOL) 15 MG/ML injection 15 mg (has no administration in time range)  LORazepam (ATIVAN) injection 1 mg (has no administration in time range)  insulin aspart (novoLOG) injection 0-15 Units (has no administration in time range)  insulin aspart (novoLOG) injection 0-5 Units (has no administration in time range)  oxyCODONE-acetaminophen (PERCOCET/ROXICET) 5-325 MG per tablet 1 tablet (1 tablet Oral Given 02/20/22 2009)  HYDROmorphone (DILAUDID) injection 0.5 mg (0.5 mg Intravenous Given 02/20/22 2351)    ED Course/ Medical Decision Making/ A&P                           Medical Decision Making Amount and/or Complexity of Data Reviewed Radiology: ordered.  Risk Prescription drug management.   This patient presents to the ED for concern of RLE pain and pain to right BKA stump, this involves an extensive number of treatment options, and is a complaint that carries with it a high risk of complications and morbidity.  The differential diagnosis includes chronic pain vs cellulitis vs osteomyelitis vs fx   Co morbidities that complicate the patient evaluation  Obesity DM Chronic pain   Additional history obtained:  External records from  outside source obtained and reviewed including right femur Xray from 02/2018 at [facility]   Lab  Tests:  I Ordered, and personally interpreted labs.  The pertinent results include:  Mild leukocytosis of 10.8, hyperglycemia of 363 w/o DKA, Creatinine of 1.07 (baseline ~ 0.9), normal lactate   Imaging Studies ordered:  I ordered imaging studies including Xray R femur and R knee  I independently visualized and interpreted imaging which showed lucency w/in the distal femoral fx fragment (osteopenia vs infection), interval fracture of the distal fixating screw of the femur w/loosening. I agree with the radiologist interpretation   Medicines ordered and prescription drug management:  I ordered medication including Oxycodone and Valium for pain and anxiety; these are chronically prescribed to the patient.  Reevaluation of the patient after these medicines showed that the patient improved I have reviewed the patients home medicines and have made adjustments as needed   Test Considered:  ESR CRP   Reevaluation:  After the interventions noted above, I reevaluated the patient and found that they have :improved   Social Determinants of Health:  Limited mobility due to prior amputation   Dispostion:  Patient presenting to the emergency department for pain to her right lower extremity and right BKA stump.  Her x-ray raises concern for possible osteomyelitis.  She is pending MRI to definitively evaluate her symptoms.  It is certainly possible that this worsening pain is related to her untreated chronic pain as she ran out of her Percocet prescription 1 week ago.  However, noted to have mild leukocytosis with tachycardia, slight erythema to right BKA stump compared to left lower extremity necessitating more extensive evaluation and imaging.  Care signed out to [place], PA-C at change of shift.         Final Clinical Impression(s) / ED Diagnoses Final diagnoses:  Acute pain of right  lower extremity    Rx / DC Orders ED Discharge Orders     None         Antonietta Breach, PA-C 02/21/22 9201    Palumbo, April, MD 02/21/22 0710

## 2022-02-21 NOTE — ED Notes (Signed)
Patient returned back from MRI at this time.  °

## 2022-02-21 NOTE — H&P (Addendum)
History and Physical  KERINA Bennett HDQ:[id number] DOB: [birth year] DOA: 02/20/2022  PCP: Pablo Lawrence, NP Patient coming from: Home   I have personally briefly reviewed patient's old medical records in [place]   Chief Complaint: Right Lower extremity pain, Stump site.   HPI: Karen Bennett is a 46 y.o. female with past medical history significant for anxiety, depression, diabetes type 2, history of infective endocarditis, PE , right BKA,  history of fungemia , morbid obesity, hypothyroidism who presents complaining of right lower extremity pain at the site of her prior stump.  She has chronic pain in the right stump site but got worse on Friday.  She developed redness at the stump site overnight.   Evaluation in the ED: Labs from 7/31, sodium 130, glucose 365, CO2 20, creatinine 1.07, albumin 1.7, lactic acid 1.6, white blood cell 10.8, platelets 420. -x-ray femur right: Status post intramedullary rod and screw fixation of comminuted distal femoral fracture with largely chronic residual displacement of the distal femoral fracture fragment. Incomplete osseous bridging at the fracture with interval fracture of distal fixating screw in the femur. Generalized heterogeneous lucency within the distal femoral fracture fragment which could be due to osteopenia though correlate for any signs or symptoms of infection to the region.  MRI: 1. Diffuse subcutaneous soft tissue swelling/edema/fluid consistent with severe cellulitis. 2. No discrete drainable fluid collection to suggest a subcutaneous abscess. 3. No findings for osteomyelitis involving the tibial or fibular stump or septic arthritis at the knee joint. 4. The supracondylar femur fracture appears largely ununited.  Review of Systems: All systems reviewed and apart from history of presenting illness, are negative.  Past Medical History:  Diagnosis Date   Anxiety and depression 06/26/2013   Cellulitis    Diabetes  mellitus    Infective endocarditis    PE (pulmonary embolism) ~2007-2008   Not on anticoagulation   Past Surgical History:  Procedure Laterality Date   Abscess removal from L groin     APPLICATION OF WOUND VAC Left 05/01/2013   Procedure: APPLICATION OF WOUND VAC;  Surgeon: Jessy Oto, MD;  Location: [facility];  Service: Orthopedics;  Laterality: Left;   BELOW KNEE LEG AMPUTATION Right    CYSTOSCOPY W/ URETERAL STENT PLACEMENT Right 09/03/2021   Procedure: CYSTOSCOPY WITH RETROGRADE PYELOGRAM/URETERAL STENT PLACEMENT;  Surgeon: Franchot Gallo, MD;  Location: [facility];  Service: Urology;  Laterality: Right;   I & D EXTREMITY  11/12/2011   Procedure: IRRIGATION AND DEBRIDEMENT EXTREMITY;  Surgeon: Mcarthur Rossetti, MD;  Location: [facility];  Service: Orthopedics;  Laterality: Left;  foot left   I & D EXTREMITY Left 09/06/2012   Procedure: IRRIGATION AND DEBRIDEMENT EXTREMITY;  Surgeon: Marybelle Killings, MD;  Location: [facility];  Service: Orthopedics;  Laterality: Left;   I & D EXTREMITY Left 05/01/2013   Procedure: INCISION AND DRAINAGE LEFT FOREFOOT ABCESS ;  Surgeon: Jessy Oto, MD;  Location: [facility];  Service: Orthopedics;  Laterality: Left;   INCISION AND DRAINAGE ABSCESS Left 12/27/2020   Procedure: INCISION AND DRAINAGE LEFT THIGH ABSCESS;  Surgeon: Clovis Riley, MD;  Location: [facility];  Service: General;  Laterality: Left;   IR FLUORO GUIDE CV LINE RIGHT  09/12/2021   IR PTA VENOUS EXCEPT DIALYSIS CIRCUIT  01/04/2021   IR RADIOLOGIST EVAL & MGMT  10/25/2021   IR US GUIDE VASC ACCESS RIGHT  09/12/2021   IR VENO/EXT/UNI RIGHT  01/04/2021   Surgery to remove hematoma  in L leg     TEE WITHOUT CARDIOVERSION N/A 01/03/2021   Procedure: TRANSESOPHAGEAL ECHOCARDIOGRAM (TEE);  Surgeon: Sueanne Margarita, MD;  Location[facility];  Service: Cardiovascular;  Laterality: N/A;   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   Social History:  reports that she has never smoked. She has never used  smokeless tobacco. She reports that she does not drink alcohol and does not use drugs.   Allergies  Allergen Reactions   Clindamycin Diarrhea and Nausea And Vomiting   Zofran Itching and Nausea And Vomiting   Doxycycline Diarrhea and Nausea And Vomiting   Morphine And Related Hives, Itching and Rash    Family History  Problem Relation Age of Onset   Diabetes Mother    Coronary artery disease Mother    Stroke Mother     Prior to Admission medications   Medication Sig Start Date End Date Taking? Authorizing Provider  acetaminophen (TYLENOL) 325 MG tablet Take 2 tablets (650 mg total) by mouth every 6 (six) hours as needed for mild pain or moderate pain. 02/09/21   Annita Brod, MD  atorvastatin (LIPITOR) 40 MG tablet Take 1 tablet (40 mg total) by mouth daily at 6 PM. 05/12/13   Buriev, Arie Sabina, MD  BIOTIN PO Take 2 capsules by mouth daily. Unsure of dose    [provider]  buPROPion ER (WELLBUTRIN SR) 100 MG 12 hr tablet Take 100 mg by mouth every morning. 05/09/21   [provider]  collagenase (SANTYL) ointment Apply topically daily. To left leg Patient not taking: Reported on 09/03/2021 01/04/21   Domenic Polite, MD  diazepam (VALIUM) 5 MG tablet Take 5 mg by mouth every 8 (eight) hours as needed for anxiety. 03/30/21   [provider]  furosemide (LASIX) 40 MG tablet Take 40 mg by mouth daily. 11/12/20   [provider]  glucose blood test strip Use as instructed 06/26/13   Advani, Vernon Prey, MD  glucose monitoring kit (FREESTYLE) monitoring kit 1 each by Does not apply route as needed for other. 06/26/13   Lorayne Marek, MD  HUMALOG KWIKPEN 100 UNIT/ML KwikPen 0-12 Units 3 (three) times daily. Inject subcutaneously twice daily as per sliding scale: if 70-150= 0 units; 151-200= 2 u; 201-250= 4 u; 251-300= 6 u; 301-350= 8 u; 351-400= 10 u; 401-450= 12u; anything greater than 400 call physician. 10/27/20   [provider]  hydrOXYzine  (VISTARIL) 50 MG capsule Take 50 mg by mouth every 8 (eight) hours as needed for itching.    [provider]  insulin glargine (SEMGLEE, YFGN,) 100 UNIT/ML Solostar Pen Inject 50 Units into the skin at bedtime. 03/30/21   [provider]  levothyroxine (SYNTHROID) 50 MCG tablet Take 1 tablet (50 mcg total) by mouth daily at 6 (six) AM. 10/26/21   Thurnell Lose, MD  metoCLOPramide (REGLAN) 5 MG tablet Take 1 tablet (5 mg total) by mouth every 6 (six) hours as needed for nausea. 02/09/21   Annita Brod, MD  metoprolol tartrate (LOPRESSOR) 25 MG tablet Take 0.5 tablets (12.5 mg total) by mouth 2 (two) times daily. 02/09/21   Annita Brod, MD  oxyCODONE (ROXICODONE) 15 MG immediate release tablet Take 1 tablet (15 mg total) by mouth every 6 (six) hours as needed for pain. 09/13/21   Oswald Hillock, MD  potassium chloride (KLOR-CON) 10 MEQ tablet Take 10 mEq by mouth daily. 05/09/21   [provider]  promethazine (  PHENERGAN) 25 MG tablet Take 25 mg by mouth 2 (two) times daily as needed for nausea/vomiting or nausea. 01/12/21   [provider]  traZODone (DESYREL) 150 MG tablet Take 150 mg by mouth at bedtime. 03/30/21   [provider]  XARELTO 20 MG TABS tablet Take 20 mg by mouth every evening. 11/12/20   [provider]   Physical Exam: Vitals:   02/21/22 1107 02/21/22 1145 02/21/22 1215 02/21/22 1410  BP:  132/68 133/78 92/62  Pulse:  67 80 92  Resp:  '17 16 16  ' Temp: 97.9 F (36.6 C)   98.3 F (36.8 C)  TempSrc: Oral   Oral  SpO2:  96% 100% 96%  Weight:      Height:        General exam: Moderately built and nourished patient, lying comfortably supine on the gurney in no obvious distress. Head, eyes and ENT: Nontraumatic and normocephalic. Pupils equally reacting to light and accommodation. Oral mucosa moist. Neck: Supple. No JVD, carotid bruit or thyromegaly. Lymphatics: No lymphadenopathy. Respiratory system: Clear to  auscultation. No increased work of breathing. Cardiovascular system: S1 and S2 heard, RRR. No JVD, murmurs, gallops, clicks or pedal edema. Gastrointestinal system: Abdomen is nondistended, soft and nontender. Normal bowel sounds heard. No organomegaly or masses appreciated. Central nervous system: Alert and oriented. No focal neurological deficits. Extremities: Symmetric 5 x 5 power. Peripheral pulses symmetrically felt.  Skin: No rashes or acute findings. Musculoskeletal system: Right BKA, with edema localized redness.  Left lower extremity without significant redness Psychiatry: Pleasant and cooperative.   Labs on Admission:  Basic Metabolic Panel: Recent Labs  Lab 02/20/22 1940  NA 130*  K 4.3  CL 100  CO2 20*  GLUCOSE 363*  BUN 16  CREATININE 1.07*  CALCIUM 8.0*   Liver Function Tests: Recent Labs  Lab 02/20/22 1940  AST 10*  ALT 9  ALKPHOS 146*  BILITOT 0.4  PROT 7.7  ALBUMIN 1.7*   No results for input(s): "LIPASE", "AMYLASE" in the last 168 hours. No results for input(s): "AMMONIA" in the last 168 hours. CBC: Recent Labs  Lab 02/20/22 1940  WBC 10.8*  NEUTROABS 7.4  HGB 12.6  HCT 40.4  MCV 90.8  PLT 420*   Cardiac Enzymes: No results for input(s): "CKTOTAL", "CKMB", "CKMBINDEX", "TROPONINI" in the last 168 hours.  BNP (last 3 results) No results for input(s): "PROBNP" in the last 8760 hours. CBG: Recent Labs  Lab 02/21/22 0720 02/21/22 0807 02/21/22 1039 02/21/22 1157  GLUCAP 298* 277* 214* 168*    Radiological Exams on Admission: DG Femur Min 2 Views Right  Result Date: 02/20/2022 CLINICAL DATA:  Pain from the knee to the hip EXAM: RIGHT FEMUR 2 VIEWS COMPARISON:  03/19/2018 FINDINGS: Status post intramedullary rod and distal screw fixation of the femur. Comminuted incompletely united distal femoral fracture with residual 1/3 shaft diameter lateral displacement of femoral condyles with respect to the distal shaft of the femur. May be slight  increased lateral displacement and angulation. There is fracture of the distal most fixating screw with surrounding lucency. IMPRESSION: Status post intramedullary rod and screw fixation of comminuted distal femoral fracture with largely chronic residual displacement of the distal femoral fracture fragment. Incomplete osseous bridging at the fracture with interval fracture of distal fixating screw in the femur. Generalized heterogeneous lucency within the distal femoral fracture fragment which could be due to osteopenia though correlate for any signs or symptoms of infection to the region. Electronically Signed  By: Donavan Foil M.D.   On: 02/20/2022 20:29   DG Knee 2 Views Right  Result Date: 02/20/2022 CLINICAL DATA:  Leg pain EXAM: RIGHT KNEE - 1-2 VIEW COMPARISON:  02/12/2018, 03/19/2018 FINDINGS: Patient is status post below the knee amputation. Partially visualized intramedullary rod and fixating screws in the femur. There is fracture of the distal most fixating screw with lucency surrounding the screw. About 1/3 shaft diameter lateral displacement of the femoral condyles with respect to the shaft of the femur, this appears to be a chronic deformity when compared with 2019. Vascular calcifications. No sizable effusion. IMPRESSION: Status post below the knee amputation. Partially visualized intramedullary rod and screw fixation of distal femoral fracture with residual lateral displacement of the femoral condyles with respect to the shaft, this appears to be a chronic finding when compared to 2019 images. There is interval fracture of the distal most fixating screw at the femur with lucency surrounding the screw suggesting loosening. Electronically Signed   By: Donavan Foil M.D.   On: 02/20/2022 20:16      Assessment/Plan Active Problems:   Diabetes mellitus type 2, insulin dependent (HCC)   Hyponatremia   Cellulitis   History of pulmonary embolus (PE)   Hypothyroidism   1-Cellulitis Right LE  Stump Site:  Staph Aureus Lugdunensis Bacteremia 2/4 bottle.  Change antibiotics to vancomycin. Follow culture.  Monitor.  Admit for observation.   2-Hyponatremia;  Monitor. Check labs today.  Pseudo-hyponatremia in setting hyperglycemia.   3-Diabetes Type 2, hyperglycemia, uncontrolled.  Continue with Semglee SSI.   4-Hypothyroidism; Continue with synthroid.  5-History of PE;  Continue with Xarelto.   6-Depression, anxiety; resume valium, Wellbutrin, trazodone.   7-HTN; Metoprolol; hold Metoprolol. Patient has not been taking it, and SBP soft.     DVT Prophylaxis: On Xarelto Code Status: Full code Family Communication: care discussed with patient.  Disposition Plan: admit under observation.   Time spent: 75 minutes    Elmarie Shiley MD [facility]   02/21/2022, 3:06 PM

## 2022-02-21 NOTE — Progress Notes (Signed)
PHARMACY - PHYSICIAN COMMUNICATION CRITICAL VALUE ALERT - BLOOD CULTURE IDENTIFICATION (BCID)  Karen Bennett is an 46 y.o. female who presented to [facility] on 02/20/2022 with a chief complaint of right lower extremity pain, stump pain.  Assessment: Pt admitted with cellulitis of RLE stump. PMH significant for endocarditis and candida glabrata fungemia.   BCID + 1/4 GPR BCID + 2/4 GPC in clusters identified as staph lugdunensis, mecA positive  Name of physician (or Provider) Contacted: Dr. Sunnie Nielsen  Current antibiotics: Cefazolin  Changes to prescribed antibiotics recommended: Changing to vancomycin  Results for orders placed or performed during the hospital encounter of 02/20/22  Blood Culture ID Panel (Reflexed) (Collected: 02/20/2022  8:25 PM)  Result Value Ref Range   Enterococcus faecalis NOT DETECTED NOT DETECTED   Enterococcus Faecium NOT DETECTED NOT DETECTED   Listeria monocytogenes NOT DETECTED NOT DETECTED   Staphylococcus species DETECTED (A) NOT DETECTED   Staphylococcus aureus (BCID) NOT DETECTED NOT DETECTED   Staphylococcus epidermidis NOT DETECTED NOT DETECTED   Staphylococcus lugdunensis DETECTED (A) NOT DETECTED   Streptococcus species NOT DETECTED NOT DETECTED   Streptococcus agalactiae NOT DETECTED NOT DETECTED   Streptococcus pneumoniae NOT DETECTED NOT DETECTED   Streptococcus pyogenes NOT DETECTED NOT DETECTED   A.calcoaceticus-baumannii NOT DETECTED NOT DETECTED   Bacteroides fragilis NOT DETECTED NOT DETECTED   Enterobacterales NOT DETECTED NOT DETECTED   Enterobacter cloacae complex NOT DETECTED NOT DETECTED   Escherichia coli NOT DETECTED NOT DETECTED   Klebsiella aerogenes NOT DETECTED NOT DETECTED   Klebsiella oxytoca NOT DETECTED NOT DETECTED   Klebsiella pneumoniae NOT DETECTED NOT DETECTED   Proteus species NOT DETECTED NOT DETECTED   Salmonella species NOT DETECTED NOT DETECTED   Serratia marcescens NOT DETECTED NOT DETECTED   Haemophilus  influenzae NOT DETECTED NOT DETECTED   Neisseria meningitidis NOT DETECTED NOT DETECTED   Pseudomonas aeruginosa NOT DETECTED NOT DETECTED   Stenotrophomonas maltophilia NOT DETECTED NOT DETECTED   Candida albicans NOT DETECTED NOT DETECTED   Candida auris NOT DETECTED NOT DETECTED   Candida glabrata NOT DETECTED NOT DETECTED   Candida krusei NOT DETECTED NOT DETECTED   Candida parapsilosis NOT DETECTED NOT DETECTED   Candida tropicalis NOT DETECTED NOT DETECTED   Cryptococcus neoformans/gattii NOT DETECTED NOT DETECTED   Methicillin resistance mecA/C DETECTED (A) NOT DETECTED    Cindi Carbon, PharmD 02/21/2022  3:27 PM

## 2022-02-22 ENCOUNTER — Observation Stay (HOSPITAL_COMMUNITY): Payer: Medicare Other

## 2022-02-22 DIAGNOSIS — Z794 Long term (current) use of insulin: Secondary | ICD-10-CM

## 2022-02-22 DIAGNOSIS — E119 Type 2 diabetes mellitus without complications: Secondary | ICD-10-CM | POA: Diagnosis not present

## 2022-02-22 DIAGNOSIS — E46 Unspecified protein-calorie malnutrition: Secondary | ICD-10-CM | POA: Diagnosis present

## 2022-02-22 DIAGNOSIS — Z8781 Personal history of (healed) traumatic fracture: Secondary | ICD-10-CM | POA: Diagnosis not present

## 2022-02-22 DIAGNOSIS — Z79899 Other long term (current) drug therapy: Secondary | ICD-10-CM | POA: Diagnosis not present

## 2022-02-22 DIAGNOSIS — Z6841 Body Mass Index (BMI) 40.0 and over, adult: Secondary | ICD-10-CM | POA: Diagnosis not present

## 2022-02-22 DIAGNOSIS — B957 Other staphylococcus as the cause of diseases classified elsewhere: Secondary | ICD-10-CM | POA: Diagnosis present

## 2022-02-22 DIAGNOSIS — Z8249 Family history of ischemic heart disease and other diseases of the circulatory system: Secondary | ICD-10-CM | POA: Diagnosis not present

## 2022-02-22 DIAGNOSIS — Z823 Family history of stroke: Secondary | ICD-10-CM | POA: Diagnosis not present

## 2022-02-22 DIAGNOSIS — G8929 Other chronic pain: Secondary | ICD-10-CM | POA: Diagnosis present

## 2022-02-22 DIAGNOSIS — F419 Anxiety disorder, unspecified: Secondary | ICD-10-CM | POA: Diagnosis present

## 2022-02-22 DIAGNOSIS — N179 Acute kidney failure, unspecified: Secondary | ICD-10-CM | POA: Diagnosis present

## 2022-02-22 DIAGNOSIS — I10 Essential (primary) hypertension: Secondary | ICD-10-CM | POA: Diagnosis present

## 2022-02-22 DIAGNOSIS — E871 Hypo-osmolality and hyponatremia: Secondary | ICD-10-CM | POA: Diagnosis present

## 2022-02-22 DIAGNOSIS — L0390 Cellulitis, unspecified: Secondary | ICD-10-CM | POA: Diagnosis present

## 2022-02-22 DIAGNOSIS — M79604 Pain in right leg: Secondary | ICD-10-CM | POA: Diagnosis not present

## 2022-02-22 DIAGNOSIS — R7881 Bacteremia: Secondary | ICD-10-CM

## 2022-02-22 DIAGNOSIS — L03115 Cellulitis of right lower limb: Secondary | ICD-10-CM | POA: Diagnosis present

## 2022-02-22 DIAGNOSIS — E1165 Type 2 diabetes mellitus with hyperglycemia: Secondary | ICD-10-CM | POA: Diagnosis present

## 2022-02-22 DIAGNOSIS — Z7901 Long term (current) use of anticoagulants: Secondary | ICD-10-CM | POA: Diagnosis not present

## 2022-02-22 DIAGNOSIS — Z79890 Hormone replacement therapy (postmenopausal): Secondary | ICD-10-CM | POA: Diagnosis not present

## 2022-02-22 DIAGNOSIS — Z888 Allergy status to other drugs, medicaments and biological substances status: Secondary | ICD-10-CM | POA: Diagnosis not present

## 2022-02-22 DIAGNOSIS — E039 Hypothyroidism, unspecified: Secondary | ICD-10-CM | POA: Diagnosis present

## 2022-02-22 DIAGNOSIS — Z91148 Patient's other noncompliance with medication regimen for other reason: Secondary | ICD-10-CM | POA: Diagnosis not present

## 2022-02-22 DIAGNOSIS — Z86711 Personal history of pulmonary embolism: Secondary | ICD-10-CM | POA: Diagnosis not present

## 2022-02-22 DIAGNOSIS — Z7401 Bed confinement status: Secondary | ICD-10-CM | POA: Diagnosis not present

## 2022-02-22 DIAGNOSIS — E43 Unspecified severe protein-calorie malnutrition: Secondary | ICD-10-CM

## 2022-02-22 DIAGNOSIS — F32A Depression, unspecified: Secondary | ICD-10-CM | POA: Diagnosis present

## 2022-02-22 LAB — COMPREHENSIVE METABOLIC PANEL
ALT: 7 U/L (ref 0–44)
AST: 8 U/L — ABNORMAL LOW (ref 15–41)
Albumin: <1.5 g/dL — ABNORMAL LOW (ref 3.5–5.0)
Alkaline Phosphatase: 113 U/L (ref 38–126)
Anion gap: 5 (ref 5–15)
BUN: 21 mg/dL — ABNORMAL HIGH (ref 6–20)
CO2: 24 mmol/L (ref 22–32)
Calcium: 7.9 mg/dL — ABNORMAL LOW (ref 8.9–10.3)
Chloride: 106 mmol/L (ref 98–111)
Creatinine, Ser: 1.29 mg/dL — ABNORMAL HIGH (ref 0.44–1.00)
GFR, Estimated: 52 mL/min — ABNORMAL LOW (ref >60)
Glucose, Bld: 191 mg/dL — ABNORMAL HIGH (ref 70–99)
Potassium: 4.5 mmol/L (ref 3.5–5.1)
Sodium: 135 mmol/L (ref 135–145)
Total Bilirubin: 0.3 mg/dL (ref 0.3–1.2)
Total Protein: 5.8 g/dL — ABNORMAL LOW (ref 6.5–8.1)

## 2022-02-22 LAB — CBC
HCT: 35 % — ABNORMAL LOW (ref 36.0–46.0)
Hemoglobin: 10.9 g/dL — ABNORMAL LOW (ref 12.0–15.0)
MCH: 28.8 pg (ref 26.0–34.0)
MCHC: 31.1 g/dL (ref 30.0–36.0)
MCV: 92.6 fL (ref 80.0–100.0)
Platelets: 363 10*3/uL (ref 150–400)
RBC: 3.78 MIL/uL — ABNORMAL LOW (ref 3.87–5.11)
RDW: 13.8 % (ref 11.5–15.5)
WBC: 10.5 10*3/uL (ref 4.0–10.5)
nRBC: 0 % (ref 0.0–0.2)

## 2022-02-22 LAB — GLUCOSE, CAPILLARY
Glucose-Capillary: 246 mg/dL — ABNORMAL HIGH (ref 70–99)
Glucose-Capillary: 237 mg/dL — ABNORMAL HIGH (ref 70–99)
Glucose-Capillary: 214 mg/dL — ABNORMAL HIGH (ref 70–99)
Glucose-Capillary: 234 mg/dL — ABNORMAL HIGH (ref 70–99)

## 2022-02-22 LAB — ECHOCARDIOGRAM COMPLETE
Area-P 1/2: 2.52 cm2
Calc EF: 62 %
Height: 66 in
S' Lateral: 3.9 cm
Single Plane A2C EF: 49.6 %
Single Plane A4C EF: 69.8 %
Weight: 6720 oz

## 2022-02-22 LAB — HIV ANTIBODY (ROUTINE TESTING W REFLEX): HIV Screen 4th Generation wRfx: NONREACTIVE

## 2022-02-22 MED ORDER — OXYCODONE HCL 5 MG PO TABS
10.0000 mg | ORAL_TABLET | ORAL | Status: DC | PRN
  Administered 2022-02-22 – 2022-02-23 (×7): 10 mg via ORAL
  Filled 2022-02-22 (×7): qty 2

## 2022-02-22 MED ORDER — PERFLUTREN LIPID MICROSPHERE
1.0000 mL | INTRAVENOUS | Status: AC | PRN
  Administered 2022-02-22: 3 mL via INTRAVENOUS

## 2022-02-22 NOTE — Assessment & Plan Note (Signed)
-   Outside appearance of skin does not appear consistent with cellulitis however possibly due to body habitus she has not manifested skin changes as MRI right lower extremity states there is "diffuse subcutaneous soft tissue swelling/edema/fluid consistent with severe cellulitis".  There is also no obvious abscess or evidence of osteomyelitis -Treated with vancomycin during hospitalization and de-escalated to Zyvox to complete 7-day course at discharge

## 2022-02-22 NOTE — Progress Notes (Signed)
Progress Note    Karen Bennett   SMO:[id number]  DOB: Mar 07, 1976  DOA: 02/20/2022     0 PCP: Karen Rutherford, NP  Initial CC: RLE pain  Hospital Course: Ms. Karen Bennett is a 47 yo female with PMH cellulitis, endocarditis, multiple bacteremias, PE, DM II, anxiety/depression who presented with right lower extremity pain.  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 There was concern for cellulitis on admission and she underwent MRI for better evaluation.  This showed diffuse subcutaneous soft tissue swelling/edema/fluid consistent with severe cellulitis.  There was no discrete drainable fluid collection to suggest abscess.  Also no findings of osteomyelitis. Admission blood cultures also grew 2/4 bottles (1 set) of Staph lugdenesis.  Given her complicated history, ID was consulted for further recommendations.   Interval History:  No events overnight.  Still complaining of pain in her right middle thigh going down towards her right stump.  Leg does not have any gross appearance of cellulitis but MRI read indicates underlying diffuse subcutaneous swelling consistent with cellulitis.  She understands plan is for further work-up regarding infection.  Assessment and Plan: * Cellulitis - Outside appearance of skin does not appear consistent with cellulitis however possibly due to body habitus she has not manifested skin changes as MRI right lower extremity states there is "diffuse subcutaneous soft tissue swelling/edema/fluid consistent with severe cellulitis".  There is also no obvious abscess or evidence of osteomyelitis - Continuing vancomycin for now.  ID also following  Bacteremia - Admission blood cultures growing 2/4 bottles (1 set) Staph lugdenesis -She does have a known history of  prior bacteremia with Candida glabrata, Acinetobacter, and E faecalis -Have requested input from ID given her complicated history and unclear if this is contaminant versus infectious -Repeat blood cultures today and follow - TTE recommended by ID, follow-up results -Continue vancomycin  AKI (acute kidney injury) (HCC) - baseline creatinine ~ 0.8 - patient presents with increase in creat >0.3 mg/dL above baseline, creat increase >1.5x baseline presumed to have occurred within past 7 days PTA - creat trended up to 1.37 - s/p IVF - trend BMP  Hyponatremia - Mild - continue diet and trending BMP  History of pulmonary embolus (PE) - Patient on chronic Xarelto - At baseline she is bedbound/chair bound  Hypothyroidism - Continue Synthroid  Depression - Continue Valium, Wellbutrin, trazodone  Diabetes mellitus type 2, insulin dependent (HCC) - A1c 12.2% - Continue SSI and CBG monitoring - Continue Semglee, will adjust as necessary   Old records reviewed in assessment of this patient  Antimicrobials: Vanc 8/1 >> current   DVT prophylaxis:  rivaroxaban (XARELTO) tablet 20 mg Start: 02/21/22 1845 rivaroxaban (XARELTO) tablet 20 mg   Code Status:   Code Status: Full Code  Mobility Assessment (last 72 hours)     Mobility Assessment     Row Name 02/21/22 2030 02/21/22 1600         Does patient have an order for bedrest or is patient medically unstable No - Continue assessment No - Continue assessment      What is the highest level of mobility based on the progressive mobility assessment? Level 2 (Chairfast) - Balance while sitting on edge of bed and cannot stand Level 2 (Chairfast) - Balance while sitting on edge of bed and cannot stand  Is the above level different from baseline mobility prior to current illness? No - Consider discontinuing PT/OT --               Disposition Plan:  Home in 1-2 days Status is: Inpt  Objective: Blood pressure (!) 147/88, pulse  77, temperature 98.1 F (36.7 C), resp. rate 16, height 5\' 6"  (1.676 m), weight (!) 190.5 kg, last menstrual period 04/13/2013, SpO2 97 %.  Examination:  Physical Exam Constitutional:      General: She is not in acute distress.    Appearance: Normal appearance. She is obese. She is not ill-appearing.  HENT:     Mouth/Throat:     Mouth: Mucous membranes are moist.  Eyes:     Extraocular Movements: Extraocular movements intact.  Cardiovascular:     Rate and Rhythm: Normal rate and regular rhythm.  Pulmonary:     Effort: Pulmonary effort is normal.     Breath sounds: Normal breath sounds.  Abdominal:     General: Bowel sounds are normal. There is no distension.     Palpations: Abdomen is soft.     Tenderness: There is no abdominal tenderness.  Musculoskeletal:     Cervical back: Normal range of motion and neck supple.     Comments: LE lymphedema noted. See pic below as well of RLE (no erythema, calor, dolor)  Skin:    General: Skin is warm and dry.  Neurological:     General: No focal deficit present.     Mental Status: She is alert.  Psychiatric:        Mood and Affect: Mood normal.        Behavior: Behavior normal.   Picture taken 02/22/22:     Consultants:  ID  Procedures:    Data Reviewed: Results for orders placed or performed during the hospital encounter of 02/20/22 (from the past 24 hour(s))  CBC     Status: Abnormal   Collection Time: 02/21/22  4:10 PM  Result Value Ref Range   WBC 12.8 (H) 4.0 - 10.5 K/uL   RBC 3.96 3.87 - 5.11 MIL/uL   Hemoglobin 11.4 (L) 12.0 - 15.0 g/dL   HCT 04/23/22 (L) 32.9 - 51.8 %   MCV 87.9 80.0 - 100.0 fL   MCH 28.8 26.0 - 34.0 pg   MCHC 32.8 30.0 - 36.0 g/dL   RDW 84.1 66.0 - 63.0 %   Platelets 408 (H) 150 - 400 K/uL   nRBC 0.0 0.0 - 0.2 %  Basic metabolic panel     Status: Abnormal   Collection Time: 02/21/22  4:10 PM  Result Value Ref Range   Sodium 132 (L) 135 - 145 mmol/L   Potassium 4.3 3.5 - 5.1 mmol/L   Chloride 102  98 - 111 mmol/L   CO2 22 22 - 32 mmol/L   Glucose, Bld 139 (H) 70 - 99 mg/dL   BUN 19 6 - 20 mg/dL   Creatinine, Ser 04/23/22 (H) 0.44 - 1.00 mg/dL   Calcium 8.0 (L) 8.9 - 10.3 mg/dL   GFR, Estimated 49 (L) >60 mL/min   Anion gap 8 5 - 15  Hemoglobin A1c     Status: Abnormal   Collection Time: 02/21/22  4:10 PM  Result Value Ref Range   Hgb A1c MFr Bld 12.2 (H) 4.8 - 5.6 %   Mean Plasma Glucose 303.44 mg/dL  Glucose, capillary     Status: Abnormal   Collection Time: 02/21/22 10:48 PM  Result Value  Ref Range   Glucose-Capillary 202 (H) 70 - 99 mg/dL  HIV Antibody (routine testing w rflx)     Status: None   Collection Time: 02/22/22  2:54 AM  Result Value Ref Range   HIV Screen 4th Generation wRfx Non Reactive Non Reactive  Comprehensive metabolic panel     Status: Abnormal   Collection Time: 02/22/22  2:54 AM  Result Value Ref Range   Sodium 135 135 - 145 mmol/L   Potassium 4.5 3.5 - 5.1 mmol/L   Chloride 106 98 - 111 mmol/L   CO2 24 22 - 32 mmol/L   Glucose, Bld 191 (H) 70 - 99 mg/dL   BUN 21 (H) 6 - 20 mg/dL   Creatinine, Ser 5.00 (H) 0.44 - 1.00 mg/dL   Calcium 7.9 (L) 8.9 - 10.3 mg/dL   Total Protein 5.8 (L) 6.5 - 8.1 g/dL   Albumin <3.7 (L) 3.5 - 5.0 g/dL   AST 8 (L) 15 - 41 U/L   ALT 7 0 - 44 U/L   Alkaline Phosphatase 113 38 - 126 U/L   Total Bilirubin 0.3 0.3 - 1.2 mg/dL   GFR, Estimated 52 (L) >60 mL/min   Anion gap 5 5 - 15  CBC     Status: Abnormal   Collection Time: 02/22/22  5:16 AM  Result Value Ref Range   WBC 10.5 4.0 - 10.5 K/uL   RBC 3.78 (L) 3.87 - 5.11 MIL/uL   Hemoglobin 10.9 (L) 12.0 - 15.0 g/dL   HCT 04.8 (L) 88.9 - 16.9 %   MCV 92.6 80.0 - 100.0 fL   MCH 28.8 26.0 - 34.0 pg   MCHC 31.1 30.0 - 36.0 g/dL   RDW 45.0 38.8 - 82.8 %   Platelets 363 150 - 400 K/uL   nRBC 0.0 0.0 - 0.2 %  Glucose, capillary     Status: Abnormal   Collection Time: 02/22/22  8:15 AM  Result Value Ref Range   Glucose-Capillary 246 (H) 70 - 99 mg/dL  Glucose, capillary      Status: Abnormal   Collection Time: 02/22/22 11:25 AM  Result Value Ref Range   Glucose-Capillary 237 (H) 70 - 99 mg/dL    I have Reviewed nursing notes, Vitals, and Lab results since pt's last encounter. Pertinent lab results : see above I have ordered test including BMP, CBC, Mg I have reviewed the last note from staff over past 24 hours I have discussed pt's care plan and test results with nursing staff, case manager   LOS: 0 days   Lewie Chamber, MD [facility] 02/22/2022, 2:47 PM

## 2022-02-22 NOTE — Assessment & Plan Note (Addendum)
-   baseline creatinine ~ 0.8 - patient presents with increase in creat >0.3 mg/dL above baseline, creat increase >1.5x baseline presumed to have occurred within past 7 days PTA - creat trended up to 1.37 - s/p IVF -Creatinine improved prior to discharge -repeat BMP at follow-up

## 2022-02-22 NOTE — Assessment & Plan Note (Signed)
-   Mild - continue diet

## 2022-02-22 NOTE — Assessment & Plan Note (Addendum)
-   A1c 12.2% - suspect poor diet tolerance - continue home regimen

## 2022-02-22 NOTE — Assessment & Plan Note (Signed)
Continue Synthroid °

## 2022-02-22 NOTE — Hospital Course (Signed)
Karen Bennett is a 46 yo female with PMH cellulitis, endocarditis, multiple bacteremias, PE, DM II, anxiety/depression who presented with right lower extremity pain.  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 There was concern for cellulitis on admission and she underwent MRI for better evaluation.  This showed diffuse subcutaneous soft tissue swelling/edema/fluid consistent with severe cellulitis.  There was no discrete drainable fluid collection to suggest abscess.  Also no findings of osteomyelitis. Admission blood cultures also grew 2/4 bottles (1 set) of Staph lugdenesis.  Given her complicated history, ID was consulted for further recommendations. After further maturation of blood cultures, they were noted to be polymicrobial more consistent with probable contamination.  With consultation of ID and in setting of her complicated history, she was transitioned to Zyvox to complete a 7-day course at discharge.

## 2022-02-22 NOTE — Consult Note (Addendum)
[facility]    Date of Admission:  02/20/2022     Reason for Consult: Staph lugdunensis bacteremia     Referring Physician: Dr Frederick Peers  Current antibiotics: Vancomycin    ASSESSMENT:    46 y.o. female admitted with:  Staph lugdunensis positive blood culture: Admission blood cultures in 1 of 2 sets are positive. Repeat cultures obtained today.  BKA stump site pain and swelling, possible cellulitis: She underwent MRI 02/21/22 that showed soft tissue swelling/edema/fluid consistent with possible cellulitis.  No drainable fluid collections noted and no findings of OM or septic arthritis. On exam, there is minimal erythema or warmth and does not appear consistent with typical cellulitis. 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 Chronic pain: reportedly ran out of her opiate pain medication shortly prior to admission. Uncontrolled DM: A1c is 12.2. AKI: Creatinine peaked at 1.37 yesterday.  Improved today.  Protein calorie malnutrition: Albumin is < 1.5. Morbid obesity: Body mass index is 67.79 kg/m.  History of prior bloodstream infections and endocarditis  RECOMMENDATIONS:    Continue vancomycin Follow up repeat cultures She does not appear to have significant findings of cellulitis on exam so wonder if this is more changes associated with edema and pain Her blood cultures are difficult to determine the significance with only 1 of 2 positive.  However, Staph lugdunensis can have similar virulence of Staph aureus so typically treated as a true pathogen Will check a TTE Lab monitoring, glycemic control, nutrition Will follow   Active Problems:   Diabetes mellitus type 2, insulin dependent (HCC)   Hyponatremia   Cellulitis   History of pulmonary embolus (PE)   Hypothyroidism   Bacteremia   MEDICATIONS:    Scheduled Meds:  atorvastatin  40 mg  Oral q1800   buPROPion ER  100 mg Oral q morning   insulin aspart  0-15 Units Subcutaneous TID WC   insulin aspart  0-5 Units Subcutaneous QHS   insulin glargine-yfgn  40 Units Subcutaneous QHS   levothyroxine  50 mcg Oral QAC breakfast   rivaroxaban  20 mg Oral Q supper   traZODone  150 mg Oral QHS   Continuous Infusions:  vancomycin 1,250 mg (02/22/22 0807)   PRN Meds:.acetaminophen, diazepam, hydrOXYzine, LORazepam, metoCLOPramide, oxyCODONE, polyethylene glycol, promethazine  HPI:    Karen Bennett is a 46 y.o. female with a complicated past medical history including candidemia (February 2023), nephrolithiasis status post prior stenting, uncontrolled diabetes, prior native valve endocarditis, prior right BKA, obesity, hypothyroidism who presented 02/20/2022 with worsening right stump pain and swelling.  She reports chronic pain in this right stump but it acutely worsened on Friday with some erythema at the stump site as well.  She presented to the emergency department as a result.  She was found to be hyperglycemic.  She had a mild leukocytosis.  She was afebrile.  She had blood cultures obtained.  These cultures are inadequately labeled with 1 labeled as "site not specified".  This culture is negative.  She has another set of cultures also labeled "site not specified" that is polymicrobial with gram-positive rods in the anaerobic bottle only and gram-positive cocci in clusters in both anaerobic and aerobic bottles.  BC ID has identified staph lugdunensis with methicillin resistance.  She has been placed on vancomycin alone based on this culture results.  Patient underwent imaging of her femur and knee.  This  showed that she is status post intramedullary rod and screw fixation of a comminuted distal femoral fracture with largely chronic residual displacement of the distal femoral fracture fragment.  There was incomplete osseous bridging at the fracture with interval fracture of the distal  fixating screw in the femur.  Generalized lucency was seen within the femoral fracture which could be due to osteopenia though possibly infection as well.  Knee x-ray also suggesting fixating screw at the femur was loosened.  She underwent MRI of the right femur yesterday for further evaluation and this is currently pending.   Past Medical History:  Diagnosis Date   Anxiety and depression 06/26/2013   Cellulitis    Diabetes mellitus    Infective endocarditis    PE (pulmonary embolism) ~2007-2008   Not on anticoagulation    Social History   Tobacco Use   Smoking status: Never   Smokeless tobacco: Never  Vaping Use   Vaping Use: Never used  Substance Use Topics   Alcohol use: No   Drug use: No    Family History  Problem Relation Age of Onset   Diabetes Mother    Coronary artery disease Mother    Stroke Mother     Allergies  Allergen Reactions   Clindamycin Diarrhea and Nausea And Vomiting   Zofran Itching and Nausea And Vomiting   Doxycycline Diarrhea and Nausea And Vomiting   Morphine And Related Hives, Itching and Rash    Review of Systems  Constitutional:  Negative for chills and fever.  Respiratory: Negative.    Cardiovascular: Negative.   Gastrointestinal: Negative.   Genitourinary: Negative.   Musculoskeletal:  Positive for joint pain. Negative for back pain and neck pain.  Skin:        Swelling, erythema of stump site.  All other systems reviewed and are negative.   OBJECTIVE:   Blood pressure (!) 140/81, pulse 89, temperature 98.7 F (37.1 C), resp. rate 16, height 5\' 6"  (1.676 m), weight (!) 190.5 kg, last menstrual period 04/13/2013, SpO2 100 %. Body mass index is 67.79 kg/m.  Physical Exam Constitutional:      Comments: Obese woman, sitting in bed, eating breakfast, no acute distress.  HENT:     Head: Normocephalic and atraumatic.  Eyes:     Extraocular Movements: Extraocular movements intact.     Conjunctiva/sclera: Conjunctivae normal.   Cardiovascular:     Comments: Unable to adequately auscultate any heart sounds given her body habitus Pulmonary:     Effort: Pulmonary effort is normal. No respiratory distress.  Abdominal:     General: There is no distension.     Palpations: Abdomen is soft.     Tenderness: There is no abdominal tenderness.  Musculoskeletal:     Cervical back: Normal range of motion and neck supple.     Comments: Status post right BKA.  Stump site with minimal erythema and no drainage.  No significant tenderness.  Some swelling appreciated that she feels like could "pop like a balloon"  Skin:    General: Skin is warm and dry.  Neurological:     General: No focal deficit present.     Mental Status: She is oriented to person, place, and time.  Psychiatric:        Mood and Affect: Mood normal.        Behavior: Behavior normal.      Lab Results: Lab Results  Component Value Date   WBC 10.5 02/22/2022   HGB 10.9 (L) 02/22/2022  HCT 35.0 (L) 02/22/2022   MCV 92.6 02/22/2022   PLT 363 02/22/2022    Lab Results  Component Value Date   NA 135 02/22/2022   K 4.5 02/22/2022   CO2 24 02/22/2022   GLUCOSE 191 (H) 02/22/2022   BUN 21 (H) 02/22/2022   CREATININE 1.29 (H) 02/22/2022   CALCIUM 7.9 (L) 02/22/2022   GFRNONAA 52 (L) 02/22/2022   GFRAA >89 09/01/2013    Lab Results  Component Value Date   ALT 7 02/22/2022   AST 8 (L) 02/22/2022   ALKPHOS 113 02/22/2022   BILITOT 0.3 02/22/2022       Component Value Date/Time   CRP <0.5 05/22/2013 1554       Component Value Date/Time   ESRSEDRATE 105 (H) 05/22/2013 1554    I have reviewed the micro and lab results in Epic.  Imaging: DG Femur Min 2 Views Right  Result Date: 02/20/2022 CLINICAL DATA:  Pain from the knee to the hip EXAM: RIGHT FEMUR 2 VIEWS COMPARISON:  03/19/2018 FINDINGS: Status post intramedullary rod and distal screw fixation of the femur. Comminuted incompletely united distal femoral fracture with residual 1/3 shaft  diameter lateral displacement of femoral condyles with respect to the distal shaft of the femur. May be slight increased lateral displacement and angulation. There is fracture of the distal most fixating screw with surrounding lucency. IMPRESSION: Status post intramedullary rod and screw fixation of comminuted distal femoral fracture with largely chronic residual displacement of the distal femoral fracture fragment. Incomplete osseous bridging at the fracture with interval fracture of distal fixating screw in the femur. Generalized heterogeneous lucency within the distal femoral fracture fragment which could be due to osteopenia though correlate for any signs or symptoms of infection to the region. Electronically Signed   By: Jasmine Pang M.D.   On: 02/20/2022 20:29   DG Knee 2 Views Right  Result Date: 02/20/2022 CLINICAL DATA:  Leg pain EXAM: RIGHT KNEE - 1-2 VIEW COMPARISON:  02/12/2018, 03/19/2018 FINDINGS: Patient is status post below the knee amputation. Partially visualized intramedullary rod and fixating screws in the femur. There is fracture of the distal most fixating screw with lucency surrounding the screw. About 1/3 shaft diameter lateral displacement of the femoral condyles with respect to the shaft of the femur, this appears to be a chronic deformity when compared with 2019. Vascular calcifications. No sizable effusion. IMPRESSION: Status post below the knee amputation. Partially visualized intramedullary rod and screw fixation of distal femoral fracture with residual lateral displacement of the femoral condyles with respect to the shaft, this appears to be a chronic finding when compared to 2019 images. There is interval fracture of the distal most fixating screw at the femur with lucency surrounding the screw suggesting loosening. Electronically Signed   By: Jasmine Pang M.D.   On: 02/20/2022 20:16     Imaging independently reviewed in Epic.  Vedia Coffer for  Infectious Disease [facility] [phone] pager 02/22/2022, 11:12 AM

## 2022-02-22 NOTE — Assessment & Plan Note (Signed)
-   Patient on chronic Xarelto - At baseline she is bedbound/chair bound

## 2022-02-22 NOTE — Assessment & Plan Note (Addendum)
-   Admission blood cultures growing 2/4 bottles (1 set) Staph lugdenesis; after further maturity the lugdenesis did not grow and instead cultures showed polymicrobial with Staph capitis, hominis, and GPR (raising more concern for probable contamination in general) -She does have a known history of prior bacteremia with Candida glabrata, Acinetobacter, and E faecalis -Patient followed by ID during hospitalization, greatly appreciate assistance - Plan is to complete an empiric course of 7 days of Zyvox at discharge -Patient also understands to hold trazodone during Zyvox course to limit adverse effects -We will continue watching blood cultures at discharge and if become positive with infectious agent, patient understands possible need for returning -Technically difficult TTE but given low suspicion for infection, obtaining TEE was deferred

## 2022-02-22 NOTE — Assessment & Plan Note (Signed)
-   Continue Valium, Wellbutrin, trazodone

## 2022-02-23 ENCOUNTER — Other Ambulatory Visit (HOSPITAL_COMMUNITY): Payer: Self-pay

## 2022-02-23 ENCOUNTER — Telehealth (HOSPITAL_COMMUNITY): Payer: Self-pay | Admitting: Pharmacy Technician

## 2022-02-23 DIAGNOSIS — E119 Type 2 diabetes mellitus without complications: Secondary | ICD-10-CM | POA: Diagnosis not present

## 2022-02-23 DIAGNOSIS — Z8781 Personal history of (healed) traumatic fracture: Secondary | ICD-10-CM

## 2022-02-23 DIAGNOSIS — F32A Depression, unspecified: Secondary | ICD-10-CM

## 2022-02-23 DIAGNOSIS — R7881 Bacteremia: Secondary | ICD-10-CM | POA: Diagnosis not present

## 2022-02-23 DIAGNOSIS — N179 Acute kidney failure, unspecified: Secondary | ICD-10-CM | POA: Diagnosis not present

## 2022-02-23 DIAGNOSIS — L03115 Cellulitis of right lower limb: Secondary | ICD-10-CM | POA: Diagnosis not present

## 2022-02-23 LAB — URINALYSIS, ROUTINE W REFLEX MICROSCOPIC
Bilirubin Urine: NEGATIVE
Glucose, UA: 50 mg/dL — AB
Ketones, ur: NEGATIVE mg/dL
Nitrite: NEGATIVE
Protein, ur: 100 mg/dL — AB
Specific Gravity, Urine: 1.011 (ref 1.005–1.030)
WBC, UA: >50 WBC/hpf — ABNORMAL HIGH (ref 0–5)
pH: 6 (ref 5.0–8.0)

## 2022-02-23 LAB — CBC WITH DIFFERENTIAL/PLATELET
Abs Immature Granulocytes: 0.08 10*3/uL — ABNORMAL HIGH (ref 0.00–0.07)
Basophils Absolute: 0 10*3/uL (ref 0.0–0.1)
Basophils Relative: 0 %
Eosinophils Absolute: 0.3 10*3/uL (ref 0.0–0.5)
Eosinophils Relative: 2 %
HCT: 28.7 % — ABNORMAL LOW (ref 36.0–46.0)
Hemoglobin: 9.1 g/dL — ABNORMAL LOW (ref 12.0–15.0)
Immature Granulocytes: 1 %
Lymphocytes Relative: 24 %
Lymphs Abs: 2.7 10*3/uL (ref 0.7–4.0)
MCH: 29.1 pg (ref 26.0–34.0)
MCHC: 31.7 g/dL (ref 30.0–36.0)
MCV: 91.7 fL (ref 80.0–100.0)
Monocytes Absolute: 0.8 10*3/uL (ref 0.1–1.0)
Monocytes Relative: 7 %
Neutro Abs: 7.3 10*3/uL (ref 1.7–7.7)
Neutrophils Relative %: 66 %
Platelets: 309 10*3/uL (ref 150–400)
RBC: 3.13 MIL/uL — ABNORMAL LOW (ref 3.87–5.11)
RDW: 13.7 % (ref 11.5–15.5)
WBC: 11.2 10*3/uL — ABNORMAL HIGH (ref 4.0–10.5)
nRBC: 0 % (ref 0.0–0.2)

## 2022-02-23 LAB — BASIC METABOLIC PANEL
Anion gap: 4 — ABNORMAL LOW (ref 5–15)
BUN: 24 mg/dL — ABNORMAL HIGH (ref 6–20)
CO2: 23 mmol/L (ref 22–32)
Calcium: 7.6 mg/dL — ABNORMAL LOW (ref 8.9–10.3)
Chloride: 106 mmol/L (ref 98–111)
Creatinine, Ser: 1.28 mg/dL — ABNORMAL HIGH (ref 0.44–1.00)
GFR, Estimated: 53 mL/min — ABNORMAL LOW (ref >60)
Glucose, Bld: 202 mg/dL — ABNORMAL HIGH (ref 70–99)
Potassium: 4.9 mmol/L (ref 3.5–5.1)
Sodium: 133 mmol/L — ABNORMAL LOW (ref 135–145)

## 2022-02-23 LAB — GLUCOSE, CAPILLARY
Glucose-Capillary: 172 mg/dL — ABNORMAL HIGH (ref 70–99)
Glucose-Capillary: 161 mg/dL — ABNORMAL HIGH (ref 70–99)

## 2022-02-23 LAB — MAGNESIUM: Magnesium: 1.6 mg/dL — ABNORMAL LOW (ref 1.7–2.4)

## 2022-02-23 MED ORDER — TRAZODONE HCL 150 MG PO TABS: 150.0000 mg | ORAL_TABLET | Freq: Every day | ORAL | Status: DC

## 2022-02-23 MED ORDER — INSULIN GLARGINE-YFGN 100 UNIT/ML ~~LOC~~ SOLN
45.0000 [IU] | Freq: Every day | SUBCUTANEOUS | Status: DC
  Filled 2022-02-23: qty 0.45

## 2022-02-23 MED ORDER — LINEZOLID 600 MG PO TABS
600.0000 mg | ORAL_TABLET | Freq: Two times a day (BID) | ORAL | Status: DC
  Administered 2022-02-23: 600 mg via ORAL
  Filled 2022-02-23: qty 1

## 2022-02-23 MED ORDER — LINEZOLID 600 MG PO TABS
600.0000 mg | ORAL_TABLET | Freq: Two times a day (BID) | ORAL | 0 refills | Status: AC
  Filled 2022-02-23: qty 12, 6d supply, fill #0

## 2022-02-23 MED ORDER — MAGNESIUM SULFATE 2 GM/50ML IV SOLN
2.0000 g | Freq: Once | INTRAVENOUS | Status: AC
  Administered 2022-02-23: 2 g via INTRAVENOUS
  Filled 2022-02-23: qty 50

## 2022-02-23 NOTE — Assessment & Plan Note (Signed)
-   s/p open treatment with IM nailing right distal third femoral shaft fracture on 12/31/2017 with Dr. Andrena Mews -Patient has known hardware loosening from prior imaging and is again noted on x-rays on admission involving the distal most fixating screw at the femur - She was recommended to follow-up with orthopedic surgery outpatient for further management as she was supposed to back in 2020 but was unable to obtain appointments due to COVID at that time

## 2022-02-23 NOTE — Inpatient Diabetes Management (Signed)
Inpatient Diabetes Program Recommendations  AACE/ADA: New Consensus Statement on Inpatient Glycemic Control (2015)  Target Ranges:  Prepandial:   less than 140 mg/dL      Peak postprandial:   less than 180 mg/dL (1-2 hours)      Critically ill patients:  140 - 180 mg/dL   Lab Results  Component Value Date   GLUCAP 161 (H) 02/23/2022   HGBA1C 12.2 (H) 02/21/2022    Review of Glycemic Control  Latest Reference Range & Units 02/23/22 08:13 02/23/22 11:44  Glucose-Capillary 70 - 99 mg/dL 151 (H) 761 (H)   Diabetes history: DM 2 Outpatient Diabetes medications:  Humalog 0-12 units tid with meals Semglee 50 units q HS Current orders for Inpatient glycemic control:  Novolog moderate tid with meals and HS Semglee 45 units q HS   Inpatient Diabetes Program Recommendations:   Blood sugars improved.  Talked with patient regarding A1C results of >12%.  She states that she is taking her insulin as ordered however notes that blood sugars go up with pasta, etc. We discussed why blood sugar control is important and also the goal A1C.  She states that she plans to f/u with endocrinologist as well. She has meter, supplies, and insulin at home.  Patient appreciated visit.   Beryl Meager, RN, BC-ADM Inpatient Diabetes Coordinator Pager [phone]  (8a-5p)

## 2022-02-23 NOTE — Progress Notes (Signed)
Karen Bennett is a 46 yo female who was consulted by ID for staph lugdunesis bacteremia. Current antibiotics of vancomycin was transitioned to linezolid 600mg  q12h for 7 days per Dr. . Patient is taking trazodone and bupropion at home, and pharmacy discussed with the patient to hold trazodone for the time being as a pre caution for any interactions between the medications. Patient understood and will hold trazodone until she finishes her course of linezolid.  Earlene Plater, PharmD. [facility] Acute Care PGY-1  02/23/2022 9:08 AM

## 2022-02-23 NOTE — Discharge Summary (Signed)
Physician Discharge Summary   CHARISSA KNOWLES MHW:[id number] DOB: Mar 24, 1976 DOA: 02/20/2022  PCP: Pablo Lawrence, NP  Admit date: 02/20/2022 Discharge date: 02/23/2022  Admitted From: Home Disposition:  Home Discharging physician: Dwyane Dee, MD  Recommendations for Outpatient Follow-up:  Repeat BMP at follow up  Home Health:  Equipment/Devices:   Discharge Condition: stable CODE STATUS: Full Diet recommendation:  Diet Orders (From admission, onward)     Start     Ordered   02/23/22 0000  Diet Carb Modified        02/23/22 1026   02/21/22 1602  Diet Carb Modified Fluid consistency: Thin; Room service appropriate? Yes  Diet effective now       Question Answer Comment  Diet-HS Snack? Nothing   Calorie Level Medium 1600-2000   Fluid consistency: Thin   Room service appropriate? Yes      02/21/22 1601            Hospital Course: Ms. Donlan is a 46 yo female with PMH cellulitis, endocarditis, multiple bacteremias, PE, DM II, anxiety/depression who presented with right lower extremity pain.  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 There was concern for cellulitis on admission and she underwent MRI for better evaluation.  This showed diffuse subcutaneous soft tissue swelling/edema/fluid consistent with severe cellulitis.  There was no discrete drainable fluid collection to suggest abscess.  Also no findings of osteomyelitis. Admission blood cultures also grew 2/4 bottles (1 set) of Staph lugdenesis.  Given her complicated history, ID was consulted for further recommendations. After further maturation of blood cultures, they were noted to be polymicrobial more consistent with probable contamination.  With consultation of ID and in setting of her complicated history,  she was transitioned to Zyvox to complete a 7-day course at discharge.  Assessment and Plan: * Cellulitis - Outside appearance of skin does not appear consistent with cellulitis however possibly due to body habitus she has not manifested skin changes as MRI right lower extremity states there is "diffuse subcutaneous soft tissue swelling/edema/fluid consistent with severe cellulitis".  There is also no obvious abscess or evidence of osteomyelitis -Treated with vancomycin during hospitalization and de-escalated to Zyvox to complete 7-day course at discharge  Bacteremia - Admission blood cultures growing 2/4 bottles (1 set) Staph lugdenesis; after further maturity the lugdenesis did not grow and instead cultures showed polymicrobial with Staph capitis, hominis, and GPR (raising more concern for probable contamination in general) -She does have a known history of prior bacteremia with Candida glabrata, Acinetobacter, and E faecalis -Patient followed by ID during hospitalization, greatly appreciate assistance - Plan is to complete an empiric course of 7 days of Zyvox at discharge -Patient also understands to hold trazodone during Zyvox course to limit adverse effects -We will continue watching blood cultures at discharge and if become positive with infectious agent, patient understands possible need for returning -Technically difficult TTE but given low suspicion for infection, obtaining TEE was deferred  AKI (acute kidney injury) ([place]) - baseline creatinine ~ 0.8 - patient presents with increase in creat >0.3 mg/dL above baseline, creat increase >1.5x baseline presumed to have occurred within past 7 days PTA - creat trended up to 1.37 - s/p IVF -Creatinine improved prior to discharge -repeat BMP at follow-up  Hyponatremia - Mild - continue diet  History of  pulmonary embolus (PE) - Patient on chronic Xarelto - At baseline she is bedbound/chair bound  History of femur fracture - s/p open  treatment with IM nailing right distal third femoral shaft fracture on 12/31/2017 with Dr. Driscilla Moats -Patient has known hardware loosening from prior imaging and is again noted on x-rays on admission involving the distal most fixating screw at the femur - She was recommended to follow-up with orthopedic surgery outpatient for further management as she was supposed to back in 2020 but was unable to obtain appointments due to [place] at that time  Hypothyroidism - Continue Synthroid  Depression - Continue Valium, Wellbutrin, trazodone  Diabetes mellitus type 2, insulin dependent ([place]) - A1c 12.2% - suspect poor diet tolerance - continue home regimen    The patient's chronic medical conditions were treated accordingly per the patient's home medication regimen except as noted.  On day of discharge, patient was felt deemed stable for discharge. Patient/family member advised to call PCP or come back to ER if needed.   Principal Diagnosis: Cellulitis  Discharge Diagnoses: Active Hospital Problems   Diagnosis Date Noted   Cellulitis 12/26/2020    Priority: 1.   Bacteremia 02/22/2022    Priority: 2.   AKI (acute kidney injury) ([place]) 05/07/2013    Priority: 3.   Hyponatremia 09/05/2011    Priority: 3.   History of pulmonary embolus (PE) 12/27/2020    Priority: 4.   History of femur fracture 02/23/2022   Hypothyroidism    Depression 05/22/2013   Diabetes mellitus type 2, insulin dependent ([place]) 06/12/2007    Resolved Hospital Problems  No resolved problems to display.     Discharge Instructions     Diet Carb Modified   Complete by: As directed       Allergies as of 02/23/2022       Reactions   Clindamycin Diarrhea, Nausea And Vomiting   Zofran Itching, Nausea And Vomiting   Doxycycline Diarrhea, Nausea And Vomiting   Morphine And Related Hives, Itching, Rash        Medication List     TAKE these medications    acetaminophen 325 MG tablet Commonly known as:  TYLENOL Take 2 tablets (650 mg total) by mouth every 6 (six) hours as needed for mild pain or moderate pain.   atorvastatin 40 MG tablet Commonly known as: LIPITOR Take 1 tablet (40 mg total) by mouth daily at 6 PM.   BIOTIN PO Take 2 capsules by mouth daily. Unsure of dose   buPROPion ER 100 MG 12 hr tablet Commonly known as: WELLBUTRIN SR Take 100 mg by mouth every morning.   collagenase 250 UNIT/GM ointment Commonly known as: SANTYL Apply topically daily. To left leg   diazepam 5 MG tablet Commonly known as: VALIUM Take 5 mg by mouth every 8 (eight) hours as needed for anxiety.   furosemide 40 MG tablet Commonly known as: LASIX Take 40 mg by mouth daily.   glucose blood test strip Use as instructed   glucose monitoring kit monitoring kit 1 each by Does not apply route as needed for other.   HumaLOG KwikPen 100 UNIT/ML KwikPen Generic drug: insulin lispro 0-12 Units 3 (three) times daily. Inject subcutaneously twice daily as per sliding scale: if 70-150= 0 units; 151-200= 2 u; 201-250= 4 u; 251-300= 6 u; 301-350= 8 u; 351-400= 10 u; 401-450= 12u; anything greater than 400 call physician.   hydrOXYzine 50 MG capsule Commonly known as: VISTARIL Take 50 mg by mouth every 8 (  eight) hours as needed for itching.   levothyroxine 50 MCG tablet Commonly known as: SYNTHROID Take 1 tablet (50 mcg total) by mouth daily at 6 (six) AM.   linezolid 600 MG tablet Commonly known as: ZYVOX Take 1 tablet (600 mg total) by mouth every 12 (twelve) hours for 6 days.   metoCLOPramide 5 MG tablet Commonly known as: REGLAN Take 1 tablet (5 mg total) by mouth every 6 (six) hours as needed for nausea.   metoprolol tartrate 25 MG tablet Commonly known as: LOPRESSOR Take 0.5 tablets (12.5 mg total) by mouth 2 (two) times daily.   oxyCODONE 15 MG immediate release tablet Commonly known as: ROXICODONE Take 1 tablet (15 mg total) by mouth every 6 (six) hours as needed for pain.    potassium chloride 10 MEQ tablet Commonly known as: KLOR-CON Take 10 mEq by mouth daily.   promethazine 25 MG tablet Commonly known as: PHENERGAN Take 25 mg by mouth 2 (two) times daily as needed for nausea/vomiting or nausea.   Semglee (yfgn) 100 UNIT/ML Solostar Pen Generic drug: insulin glargine Inject 50 Units into the skin at bedtime.   traZODone 150 MG tablet Commonly known as: DESYREL Take 1 tablet (150 mg total) by mouth at bedtime. Start taking on: March 02, 2022 What changed: These instructions start on March 02, 2022. If you are unsure what to do until then, ask your doctor or other care provider.   Xarelto 20 MG Tabs tablet Generic drug: rivaroxaban Take 20 mg by mouth every evening.        Allergies  Allergen Reactions   Clindamycin Diarrhea and Nausea And Vomiting   Zofran Itching and Nausea And Vomiting   Doxycycline Diarrhea and Nausea And Vomiting   Morphine And Related Hives, Itching and Rash    Consultations: ID  Procedures:   Discharge Exam: BP (!) 156/71 (BP Location: Left Arm)   Pulse 71   Temp 98.3 F (36.8 C) (Oral)   Resp 17   Ht $R'5\' 6"'Wr$  (1.676 m)   Wt (!) 190.5 kg   LMP 04/13/2013   SpO2 93%   BMI 67.79 kg/m  Physical Exam Constitutional:      General: She is not in acute distress.    Appearance: Normal appearance. She is obese. She is not ill-appearing.  HENT:     Mouth/Throat:     Mouth: Mucous membranes are moist.  Eyes:     Extraocular Movements: Extraocular movements intact.  Cardiovascular:     Rate and Rhythm: Normal rate and regular rhythm.  Pulmonary:     Effort: Pulmonary effort is normal.     Breath sounds: Normal breath sounds.  Abdominal:     General: Bowel sounds are normal. There is no distension.     Palpations: Abdomen is soft.     Tenderness: There is no abdominal tenderness.  Musculoskeletal:     Cervical back: Normal range of motion and neck supple.     Comments: LE lymphedema noted.  No erythema  or calor appreciated.  Ongoing tenderness to palpation  Skin:    General: Skin is warm and dry.  Neurological:     General: No focal deficit present.     Mental Status: She is alert.  Psychiatric:        Mood and Affect: Mood normal.        Behavior: Behavior normal.      The results of significant diagnostics from this hospitalization (including imaging, microbiology, ancillary and laboratory) are listed  below for reference.   Microbiology: Recent Results (from the past 240 hour(s))  Culture, blood (routine x 2)     Status: None (Preliminary result)   Collection Time: 02/20/22  7:40 PM   Specimen: BLOOD  Result Value Ref Range Status   Specimen Description   Final    BLOOD SITE NOT SPECIFIED Performed at [facility] [address], [place], [place] [address]    Special Requests   Final    BOTTLES DRAWN AEROBIC AND ANAEROBIC Blood Culture results may not be optimal due to an inadequate volume of blood received in culture bottles Performed at [facility] [address], [place], [place] [address]    Culture   Final    NO GROWTH 2 DAYS Performed at [facility] [address] [place], [place] [address]    Report Status PENDING  Incomplete  Culture, blood (routine x 2)     Status: Abnormal (Preliminary result)   Collection Time: 02/20/22  8:25 PM   Specimen: BLOOD  Result Value Ref Range Status   Specimen Description   Final    BLOOD SITE NOT SPECIFIED Performed at [facility] [address], [place], [place] [address]    Special Requests   Final    BOTTLES DRAWN AEROBIC AND ANAEROBIC Blood Culture adequate volume Performed at [facility] [address] [place], [place] [address]    Culture  Setup Time   Final    GRAM POSITIVE RODS ANAEROBIC BOTTLE ONLY GRAM POSITIVE COCCI IN CLUSTERS IN BOTH AEROBIC AND ANAEROBIC BOTTLES CRITICAL RESULT CALLED TO, READ BACK BY AND VERIFIED WITH:  RN Aldean Baker [phone] _0  FH Performed at [facility], [place] [address] [place], [place] [address]    Culture (A)  Final    STAPHYLOCOCCUS CAPITIS STAPHYLOCOCCUS HOMINIS GRAM POSITIVE RODS    Report Status PENDING  Incomplete  Blood Culture ID Panel (Reflexed)     Status: Abnormal   Collection Time: 02/20/22  8:25 PM  Result Value Ref Range Status   Enterococcus faecalis NOT DETECTED NOT DETECTED Final   Enterococcus Faecium NOT DETECTED NOT DETECTED Final   Listeria monocytogenes NOT DETECTED NOT DETECTED Final   Staphylococcus species DETECTED (A) NOT DETECTED Final    Comment: CRITICAL RESULT CALLED TO, READ BACK BY AND VERIFIED WITH: RN Aldean Baker [phone] _1  FH    Staphylococcus aureus (BCID) NOT DETECTED NOT DETECTED Final   Staphylococcus epidermidis NOT DETECTED NOT DETECTED Final   Staphylococcus lugdunensis DETECTED (A) NOT DETECTED Final    Comment: Methicillin (oxacillin) resistant coagulase negative staphylococcus. Possible blood culture contaminant (unless isolated from more than one blood culture draw or clinical case suggests pathogenicity). No antibiotic treatment is indicated for blood  culture contaminants. CRITICAL RESULT CALLED TO, READ BACK BY AND VERIFIED WITH: RN Aldean Baker [phone] _2  FH    Streptococcus species NOT DETECTED NOT DETECTED Final   Streptococcus agalactiae NOT DETECTED NOT DETECTED Final   Streptococcus pneumoniae NOT DETECTED NOT DETECTED Final   Streptococcus pyogenes NOT DETECTED NOT DETECTED Final   A.calcoaceticus-baumannii NOT DETECTED NOT DETECTED Final   Bacteroides fragilis NOT DETECTED NOT DETECTED Final   Enterobacterales NOT DETECTED NOT DETECTED Final   Enterobacter cloacae complex NOT DETECTED NOT DETECTED Final   Escherichia coli NOT DETECTED NOT DETECTED Final   Klebsiella aerogenes NOT DETECTED NOT DETECTED Final   Klebsiella oxytoca NOT DETECTED NOT DETECTED Final   Klebsiella pneumoniae NOT  DETECTED NOT DETECTED  Final   Proteus species NOT DETECTED NOT DETECTED Final   Salmonella species NOT DETECTED NOT DETECTED Final   Serratia marcescens NOT DETECTED NOT DETECTED Final   Haemophilus influenzae NOT DETECTED NOT DETECTED Final   Neisseria meningitidis NOT DETECTED NOT DETECTED Final   Pseudomonas aeruginosa NOT DETECTED NOT DETECTED Final   Stenotrophomonas maltophilia NOT DETECTED NOT DETECTED Final   Candida albicans NOT DETECTED NOT DETECTED Final   Candida auris NOT DETECTED NOT DETECTED Final   Candida glabrata NOT DETECTED NOT DETECTED Final   Candida krusei NOT DETECTED NOT DETECTED Final   Candida parapsilosis NOT DETECTED NOT DETECTED Final   Candida tropicalis NOT DETECTED NOT DETECTED Final   Cryptococcus neoformans/gattii NOT DETECTED NOT DETECTED Final   Methicillin resistance mecA/C DETECTED (A) NOT DETECTED Final    Comment: CRITICAL RESULT CALLED TO, READ BACK BY AND VERIFIED WITH: RN Aldean Baker [phone] _0  FH Performed at [facility], [address] [place], [place] [address]      Labs: BNP (last 3 results) No results for input(s): "BNP" in the last 8760 hours. Basic Metabolic Panel: Recent Labs  Lab 02/20/22 1940 02/21/22 1610 02/22/22 0254 02/23/22 0223  NA 130* 132* 135 133*  K 4.3 4.3 4.5 4.9  CL 100 102 106 106  CO2 20* _1 GLUCOSE 363* 139* 191* 202*  BUN 16 19 21* 24*  CREATININE 1.07* 1.37* 1.29* 1.28*  CALCIUM 8.0* 8.0* 7.9* 7.6*  MG  --   --   --  1.6*   Liver Function Tests: Recent Labs  Lab 02/20/22 1940 02/22/22 0254  AST 10* 8*  ALT 9 7  ALKPHOS 146* 113  BILITOT 0.4 0.3  PROT 7.7 5.8*  ALBUMIN 1.7* <1.5*   No results for input(s): "LIPASE", "AMYLASE" in the last 168 hours. No results for input(s): "AMMONIA" in the last 168 hours. CBC: Recent Labs  Lab 02/20/22 1940 02/21/22 1610 02/22/22 0516 02/23/22 0223  WBC 10.8* 12.8* 10.5 11.2*  NEUTROABS 7.4  --   --  7.3  HGB 12.6 11.4* 10.9* 9.1*  HCT 40.4 34.8* 35.0*  28.7*  MCV 90.8 87.9 92.6 91.7  PLT 420* 408* 363 309   Cardiac Enzymes: No results for input(s): "CKTOTAL", "CKMB", "CKMBINDEX", "TROPONINI" in the last 168 hours. BNP: Invalid input(s): "POCBNP" CBG: Recent Labs  Lab 02/22/22 0815 02/22/22 1125 02/22/22 1646 02/22/22 2137 02/23/22 0813  GLUCAP 246* 237* 214* 234* 172*   D-Dimer No results for input(s): "DDIMER" in the last 72 hours. Hgb A1c Recent Labs    02/21/22 1610  HGBA1C 12.2*   Lipid Profile No results for input(s): "CHOL", "HDL", "LDLCALC", "TRIG", "CHOLHDL", "LDLDIRECT" in the last 72 hours. Thyroid function studies No results for input(s): "TSH", "T4TOTAL", "T3FREE", "THYROIDAB" in the last 72 hours.  Invalid input(s): "FREET3" Anemia work up No results for input(s): "VITAMINB12", "FOLATE", "FERRITIN", "TIBC", "IRON", "RETICCTPCT" in the last 72 hours. Urinalysis    Component Value Date/Time   COLORURINE YELLOW 02/23/2022 0611   APPEARANCEUR HAZY (A) 02/23/2022 0611   LABSPEC 1.011 02/23/2022 0611   PHURINE 6.0 02/23/2022 0611   GLUCOSEU 50 (A) 02/23/2022 0611   HGBUR LARGE (A) 02/23/2022 0611   BILIRUBINUR NEGATIVE 02/23/2022 0611   KETONESUR NEGATIVE 02/23/2022 0611   PROTEINUR 100 (A) 02/23/2022 0611   UROBILINOGEN 0.2 09/05/2011 0558   NITRITE NEGATIVE 02/23/2022 0611   LEUKOCYTESUR LARGE (A) 02/23/2022 0611   Sepsis Labs Recent Labs  Lab  02/20/22 1940 02/21/22 1610 02/22/22 0516 02/23/22 0223  WBC 10.8* 12.8* 10.5 11.2*   Microbiology Recent Results (from the past 240 hour(s))  Culture, blood (routine x 2)     Status: None (Preliminary result)   Collection Time: 02/20/22  7:40 PM   Specimen: BLOOD  Result Value Ref Range Status   Specimen Description   Final    BLOOD SITE NOT SPECIFIED Performed at [facility] [address] [place], [place] [address]    Special Requests   Final    BOTTLES DRAWN AEROBIC AND ANAEROBIC Blood Culture results may not be optimal  due to an inadequate volume of blood received in culture bottles Performed at [facility] [address] [place], [place] [address]    Culture   Final    NO GROWTH 2 DAYS Performed at [facility] [address] [place], [place] [address]    Report Status PENDING  Incomplete  Culture, blood (routine x 2)     Status: Abnormal (Preliminary result)   Collection Time: 02/20/22  8:25 PM   Specimen: BLOOD  Result Value Ref Range Status   Specimen Description   Final    BLOOD SITE NOT SPECIFIED Performed at [facility] [address], [place], [place] [address]    Special Requests   Final    BOTTLES DRAWN AEROBIC AND ANAEROBIC Blood Culture adequate volume Performed at [facility] [address] [place], [place] [address]    Culture  Setup Time   Final    GRAM POSITIVE RODS ANAEROBIC BOTTLE ONLY GRAM POSITIVE COCCI IN CLUSTERS IN BOTH AEROBIC AND ANAEROBIC BOTTLES CRITICAL RESULT CALLED TO, READ BACK BY AND VERIFIED WITH: RN Aldean Baker [phone] _0  FH Performed at [facility], [place] [address], [place],  [address]    Culture (A)  Final    STAPHYLOCOCCUS CAPITIS STAPHYLOCOCCUS HOMINIS GRAM POSITIVE RODS    Report Status PENDING  Incomplete  Blood Culture ID Panel (Reflexed)     Status: Abnormal   Collection Time: 02/20/22  8:25 PM  Result Value Ref Range Status   Enterococcus faecalis NOT DETECTED NOT DETECTED Final   Enterococcus Faecium NOT DETECTED NOT DETECTED Final   Listeria monocytogenes NOT DETECTED NOT DETECTED Final   Staphylococcus species DETECTED (A) NOT DETECTED Final    Comment: CRITICAL RESULT CALLED TO, READ BACK BY AND VERIFIED WITH: RN Aldean Baker [phone] _1  FH    Staphylococcus aureus (BCID) NOT DETECTED NOT DETECTED Final   Staphylococcus epidermidis NOT DETECTED NOT DETECTED Final   Staphylococcus lugdunensis DETECTED (A) NOT DETECTED Final    Comment: Methicillin  (oxacillin) resistant coagulase negative staphylococcus. Possible blood culture contaminant (unless isolated from more than one blood culture draw or clinical case suggests pathogenicity). No antibiotic treatment is indicated for blood  culture contaminants. CRITICAL RESULT CALLED TO, READ BACK BY AND VERIFIED WITH: RN Aldean Baker [phone] _2  FH    Streptococcus species NOT DETECTED NOT DETECTED Final   Streptococcus agalactiae NOT DETECTED NOT DETECTED Final   Streptococcus pneumoniae NOT DETECTED NOT DETECTED Final   Streptococcus pyogenes NOT DETECTED NOT DETECTED Final   A.calcoaceticus-baumannii NOT DETECTED NOT DETECTED Final   Bacteroides fragilis NOT DETECTED NOT DETECTED Final   Enterobacterales NOT DETECTED NOT DETECTED Final   Enterobacter cloacae complex NOT DETECTED NOT DETECTED Final   Escherichia coli NOT DETECTED NOT DETECTED Final   Klebsiella aerogenes NOT DETECTED NOT DETECTED Final  Klebsiella oxytoca NOT DETECTED NOT DETECTED Final   Klebsiella pneumoniae NOT DETECTED NOT DETECTED Final   Proteus species NOT DETECTED NOT DETECTED Final   Salmonella species NOT DETECTED NOT DETECTED Final   Serratia marcescens NOT DETECTED NOT DETECTED Final   Haemophilus influenzae NOT DETECTED NOT DETECTED Final   Neisseria meningitidis NOT DETECTED NOT DETECTED Final   Pseudomonas aeruginosa NOT DETECTED NOT DETECTED Final   Stenotrophomonas maltophilia NOT DETECTED NOT DETECTED Final   Candida albicans NOT DETECTED NOT DETECTED Final   Candida auris NOT DETECTED NOT DETECTED Final   Candida glabrata NOT DETECTED NOT DETECTED Final   Candida krusei NOT DETECTED NOT DETECTED Final   Candida parapsilosis NOT DETECTED NOT DETECTED Final   Candida tropicalis NOT DETECTED NOT DETECTED Final   Cryptococcus neoformans/gattii NOT DETECTED NOT DETECTED Final   Methicillin resistance mecA/C DETECTED (A) NOT DETECTED Final    Comment: CRITICAL RESULT CALLED TO, READ BACK BY AND VERIFIED  WITH: RN Aldean Baker [phone] _0  FH Performed at [facility], [address], [place], [place] [address]     Procedures/Studies: ECHOCARDIOGRAM COMPLETE  Result Date: 02/22/2022    ECHOCARDIOGRAM REPORT   Patient Name:   TENASIA AULL Date of Exam: 02/22/2022 Medical Rec #:  [id number]       Height:       66.0 in Accession #:    [id number]      Weight:       420.0 lb Date of Birth:  10-May-1976      BSA:          2.741 m Patient Age:    45 years        BP:           147/88 mmHg Patient Gender: F               HR:           77 bpm. Exam Location:  Inpatient Procedure: 2D Echo, Cardiac Doppler, Color Doppler and Intracardiac            Opacification Agent Indications:    Bacteremia R78.81  History:        Patient has prior history of Echocardiogram examinations, most                 recent 09/06/2021. Risk Factors:Diabetes. Past history of                 infective endocarditis, fungemia. Thyroid disease.  Sonographer:    Darlina Sicilian RDCS Referring Phys: [id number] [place]  1. Left ventricular ejection fraction, by estimation, is 55%. The left ventricle has normal function. The left ventricle has no regional wall motion abnormalities. Septal bounce was noted. Left ventricular diastolic parameters are consistent with Grade I diastolic dysfunction (impaired relaxation).  2. Right ventricular systolic function is normal. The right ventricular size is normal. Tricuspid regurgitation signal is inadequate for assessing PA pressure.  3. The mitral valve was not well visualized. No evidence of mitral valve regurgitation. No evidence of mitral stenosis.  4. The aortic valve is tricuspid. Aortic valve regurgitation is not visualized. No aortic stenosis is present.  5. The inferior vena cava is normal in size with greater than 50% respiratory variability, suggesting right atrial pressure of 3 mmHg.  6. Technically difficult study with poor acoustic windows. If there is significant concern for  endocarditis, would need TEE to clear . FINDINGS  Left Ventricle: Left ventricular ejection fraction, by  estimation, is 55%. The left ventricle has normal function. The left ventricle has no regional wall motion abnormalities. Definity contrast agent was given IV to delineate the left ventricular endocardial borders. The left ventricular internal cavity size was normal in size. There is no left ventricular hypertrophy. Left ventricular diastolic parameters are consistent with Grade I diastolic dysfunction (impaired relaxation). Right Ventricle: The right ventricular size is normal. No increase in right ventricular wall thickness. Right ventricular systolic function is normal. Tricuspid regurgitation signal is inadequate for assessing PA pressure. Left Atrium: Left atrial size was normal in size. Right Atrium: Right atrial size was normal in size. Pericardium: There is no evidence of pericardial effusion. Mitral Valve: The mitral valve was not well visualized. No evidence of mitral valve regurgitation. No evidence of mitral valve stenosis. Tricuspid Valve: The tricuspid valve is not well visualized. Tricuspid valve regurgitation is not demonstrated. Aortic Valve: The aortic valve is tricuspid. Aortic valve regurgitation is not visualized. No aortic stenosis is present. Pulmonic Valve: The pulmonic valve was not well visualized. Pulmonic valve regurgitation is not visualized. Aorta: The aortic root is normal in size and structure. Venous: The inferior vena cava is normal in size with greater than 50% respiratory variability, suggesting right atrial pressure of 3 mmHg. IAS/Shunts: No atrial level shunt detected by color flow Doppler.  LEFT VENTRICLE PLAX 2D LVIDd:         5.50 cm      Diastology LVIDs:         3.90 cm      LV e' medial:    6.74 cm/s LV PW:         1.00 cm      LV E/e' medial:  10.5 LV IVS:        0.80 cm      LV e' lateral:   10.60 cm/s LVOT diam:     2.10 cm      LV E/e' lateral: 6.7 LV SV:         61  LV SV Index:   22 LVOT Area:     3.46 cm  LV Volumes (MOD) LV vol d, MOD A2C: 159.0 ml LV vol d, MOD A4C: 171.0 ml LV vol s, MOD A2C: 80.1 ml LV vol s, MOD A4C: 51.6 ml LV SV MOD A2C:     78.9 ml LV SV MOD A4C:     171.0 ml LV SV MOD BP:      105.0 ml RIGHT VENTRICLE RV S prime:     15.30 cm/s TAPSE (M-mode): 2.8 cm LEFT ATRIUM             Index        RIGHT ATRIUM           Index LA diam:        3.80 cm 1.39 cm/m   RA Area:     16.10 cm LA Vol (A2C):   37.4 ml 13.64 ml/m  RA Volume:   39.20 ml  14.30 ml/m LA Vol (A4C):   43.0 ml 15.67 ml/m LA Biplane Vol: 44.6 ml 16.27 ml/m  AORTIC VALVE LVOT Vmax:   113.00 cm/s LVOT Vmean:  74.700 cm/s LVOT VTI:    0.175 m  AORTA Ao Root diam: 2.90 cm Ao Asc diam:  3.50 cm MITRAL VALVE MV Area (PHT): 2.52 cm     SHUNTS MV Decel Time: 301 msec     Systemic VTI:  0.18 m MV E velocity: 71.10 cm/s   Systemic  Diam: 2.10 cm MV A velocity: 100.00 cm/s MV E/A ratio:  0.71 Dalton McleanMD Electronically signed by Franki Monte Signature Date/Time: 02/22/2022/7:28:57 PM    Final    MR FRMUR RIGHT WO CONTRAST  Result Date: 02/21/2022 CLINICAL DATA:  Right lower extremity pain and swelling for 3 days. History of remote trauma below-knee-amputation. EXAM: MR OF THE RIGHT FEMUR WITHOUT CONTRAST TECHNIQUE: Multiplanar, multisequence MR imaging of the right leg was performed. No intravenous contrast was administered. COMPARISON:  Radiograph 02/20/2022 FINDINGS: There is an intramedullary rod in the right femur with for distal screws. The lowest screw is fractured and appears loose on the radiographs. The supracondylar fracture appears largely ununited. Severe diffuse subcutaneous soft tissue swelling/edema/fluid consistent with severe cellulitis. I do not see a discrete drainable fluid collection to suggest a subcutaneous abscess. Diffuse myositis involving the calf musculature which is severely fatty atrophy. No findings suspicious for osteomyelitis or septic arthritis at the knee  joint. IMPRESSION: 1. Diffuse subcutaneous soft tissue swelling/edema/fluid consistent with severe cellulitis. 2. No discrete drainable fluid collection to suggest a subcutaneous abscess. 3. No findings for osteomyelitis involving the tibial or fibular stump or septic arthritis at the knee joint. 4. The supracondylar femur fracture appears largely ununited. Electronically Signed   By: Marijo Sanes M.D.   On: 02/21/2022 13:25   DG Femur Min 2 Views Right  Result Date: 02/20/2022 CLINICAL DATA:  Pain from the knee to the hip EXAM: RIGHT FEMUR 2 VIEWS COMPARISON:  03/19/2018 FINDINGS: Status post intramedullary rod and distal screw fixation of the femur. Comminuted incompletely united distal femoral fracture with residual 1/3 shaft diameter lateral displacement of femoral condyles with respect to the distal shaft of the femur. May be slight increased lateral displacement and angulation. There is fracture of the distal most fixating screw with surrounding lucency. IMPRESSION: Status post intramedullary rod and screw fixation of comminuted distal femoral fracture with largely chronic residual displacement of the distal femoral fracture fragment. Incomplete osseous bridging at the fracture with interval fracture of distal fixating screw in the femur. Generalized heterogeneous lucency within the distal femoral fracture fragment which could be due to osteopenia though correlate for any signs or symptoms of infection to the region. Electronically Signed   By: Donavan Foil M.D.   On: 02/20/2022 20:29   DG Knee 2 Views Right  Result Date: 02/20/2022 CLINICAL DATA:  Leg pain EXAM: RIGHT KNEE - 1-2 VIEW COMPARISON:  02/12/2018, 03/19/2018 FINDINGS: Patient is status post below the knee amputation. Partially visualized intramedullary rod and fixating screws in the femur. There is fracture of the distal most fixating screw with lucency surrounding the screw. About 1/3 shaft diameter lateral displacement of the femoral  condyles with respect to the shaft of the femur, this appears to be a chronic deformity when compared with 2019. Vascular calcifications. No sizable effusion. IMPRESSION: Status post below the knee amputation. Partially visualized intramedullary rod and screw fixation of distal femoral fracture with residual lateral displacement of the femoral condyles with respect to the shaft, this appears to be a chronic finding when compared to 2019 images. There is interval fracture of the distal most fixating screw at the femur with lucency surrounding the screw suggesting loosening. Electronically Signed   By: Donavan Foil M.D.   On: 02/20/2022 20:16     Time coordinating discharge: Over 30 minutes    Dwyane Dee, MD  [facility] 02/23/2022, 10:39 AM

## 2022-02-23 NOTE — TOC Benefit Eligibility Note (Signed)
Patient Product/process development scientist completed.    The patient is currently admitted and upon discharge could be taking linezolid (Zyvox) 600 mg tablets.  The current 7 day co-pay is $0.00.   The patient is insured through [employer] Part D     Roland Earl, CPhT Pharmacy Patient Advocate Specialist [facility] Patient Advocate Team Direct Number: [phone]  Fax: [phone]

## 2022-02-23 NOTE — Progress Notes (Signed)
Patient discharged to home via PTAR. All belongings and medications w/ patient.

## 2022-02-23 NOTE — TOC Transition Note (Signed)
Transition of Care [facility]) - CM/SW Discharge Note  Patient Details  Name: Karen Bennett MRN: [id number] Date of Birth: [birth year]  [facility] CM/SW Contact:  Ewing Schlein, LCSW Phone Number: 02/23/2022, 12:53 PM  Clinical Narrative: Patient will need PTAR transport home. Medical necessity form done; PTAR scheduled. RN updated. TOC signing off.    Final next level of care: Home/Self Care Barriers to Discharge: Barriers Resolved  Patient Goals and CMS Choice Patient states their goals for this hospitalization and ongoing recovery are:: Return home Choice offered to / list presented to : NA  Discharge Plan and Services       DME Arranged: N/A DME Agency: NA  Readmission Risk Interventions    02/23/2022   10:38 AM 09/12/2021    1:15 PM 09/07/2021    3:18 PM  Readmission Risk Prevention Plan  Transportation Screening Complete  Complete  HRI or Home Care Consult Complete    Social Work Consult for Recovery Care Planning/Counseling Complete    Palliative Care Screening Not Applicable    Medication Review Oceanographer) Complete Complete   HRI or Home Care Consult   Complete  SW Recovery Care/Counseling Consult   Complete  Palliative Care Screening   Not Applicable  Skilled Nursing Facility  Not Applicable

## 2022-02-23 NOTE — Telephone Encounter (Signed)
Pharmacy Patient Advocate Encounter  Insurance verification completed.    The patient is insured through [employer] Part D   The patient is currently admitted and ran test claims for the following: linezolid (Zyvox) 600 mg tablets.  Copays and coinsurance results were relayed to Inpatient clinical team.  

## 2022-02-23 NOTE — Progress Notes (Signed)
[facility] for Infectious Disease  Date of Admission:  02/20/2022           Reason for visit: Follow up on cellulitis/bacteremia  Current antibiotics: Vancomycin  ASSESSMENT:    46 y.o. female admitted with:  Positive blood cultures: Her admission blood cultures are polymicrobial in 1 of 2 sets labeled site not specified. She reports they had a difficult time drawing these cultures requiring multiple sticks and cultures have Staph capitis, Staph hominis, and GPR.  Staph lugdunensis detected on BCID but not in cultures as of now.  Perhaps a low level of this bacteria also present to fire the BCID but not enough to grow in culture.  I think this is all consistent with a contaminant in addition to her overall presentation being unclear if there is truly a cellulitis.  Her repeat cultures are NGTD and TTE did not show obvious vegetation.  It was not a great study but overall the suspicion for endocarditis is low and I think TEE can be deferred for now.  Possible cellulitis: She underwent MRI 02/21/22 that showed soft tissue swelling/edema/fluid consistent with possible cellulitis.  No drainable fluid collections noted and no findings of OM or septic arthritis. On exam, there is minimal erythema or warmth and does not appear consistent with typical cellulitis. ? Edema/3rd spacing with her albumin of < 1.5. Depression:  She is on Trazodone and Wellbutrin outpatient. Uncontrolled DM: A1c is 12.2. Protein calorie malnutrition: Albumin is < 1.5. Morbid obesity: Body mass index is 67.79 kg/m.   RECOMMENDATIONS:    Will switch to linezolid 600mg  PO BID.  Plan to treat possible cellulitis and possible uncomplicated BSI x 7 more days from negative cultures.  End date = 03/01/22 although difficult to discern if there is an infection here Discussed with patient and she will hold her Trazodone while on Linezolid to reduce risk of AE Follow up repeat cultures.  Discussed wth patient if her repeats  turn positive then she may need to present back to the ER.  She is agreeable  Glycemic control, Nutrition Will sign off, please call as needed.   Principal Problem:   Cellulitis Active Problems:   Diabetes mellitus type 2, insulin dependent (HCC)   Hyponatremia   AKI (acute kidney injury) ([place])   Depression   History of pulmonary embolus (PE)   Hypothyroidism   Bacteremia    MEDICATIONS:    Scheduled Meds:  atorvastatin  40 mg Oral q1800   buPROPion ER  100 mg Oral q morning   insulin aspart  0-15 Units Subcutaneous TID WC   insulin aspart  0-5 Units Subcutaneous QHS   insulin glargine-yfgn  45 Units Subcutaneous QHS   levothyroxine  50 mcg Oral QAC breakfast   rivaroxaban  20 mg Oral Q supper   traZODone  150 mg Oral QHS   Continuous Infusions:  magnesium sulfate bolus IVPB     vancomycin 1,250 mg (02/23/22 0839)   PRN Meds:.acetaminophen, diazepam, hydrOXYzine, LORazepam, metoCLOPramide, oxyCODONE, polyethylene glycol, promethazine  SUBJECTIVE:   24 hour events:  No acute events overnight  She is feeling well.  States she would like to go home if it is safe to do.    Review of Systems  All other systems reviewed and are negative.     OBJECTIVE:   Blood pressure (!) 156/71, pulse 71, temperature 98.3 F (36.8 C), temperature source Oral, resp. rate 17, height 5\' 6"  (1.676 m), weight (!) 190.5 kg, last  menstrual period 04/13/2013, SpO2 93 %. Body mass index is 67.79 kg/m.  Physical Exam Constitutional:      General: She is not in acute distress.    Appearance: Normal appearance. She is obese.  HENT:     Head: Normocephalic and atraumatic.  Pulmonary:     Effort: Pulmonary effort is normal. No respiratory distress.  Abdominal:     General: There is no distension.     Palpations: Abdomen is soft.  Musculoskeletal:     Comments: S/p right BKA.  Minimal warmth and no erythema.  No TTP.   Skin:    General: Skin is warm and dry.  Neurological:      General: No focal deficit present.     Mental Status: She is alert and oriented to person, place, and time.  Psychiatric:        Mood and Affect: Mood normal.        Behavior: Behavior normal.      Lab Results: Lab Results  Component Value Date   WBC 11.2 (H) 02/23/2022   HGB 9.1 (L) 02/23/2022   HCT 28.7 (L) 02/23/2022   MCV 91.7 02/23/2022   PLT 309 02/23/2022    Lab Results  Component Value Date   NA 133 (L) 02/23/2022   K 4.9 02/23/2022   CO2 23 02/23/2022   GLUCOSE 202 (H) 02/23/2022   BUN 24 (H) 02/23/2022   CREATININE 1.28 (H) 02/23/2022   CALCIUM 7.6 (L) 02/23/2022   GFRNONAA 53 (L) 02/23/2022   GFRAA >89 09/01/2013    Lab Results  Component Value Date   ALT 7 02/22/2022   AST 8 (L) 02/22/2022   ALKPHOS 113 02/22/2022   BILITOT 0.3 02/22/2022       Component Value Date/Time   CRP <0.5 05/22/2013 1554       Component Value Date/Time   ESRSEDRATE 105 (H) 05/22/2013 1554     I have reviewed the micro and lab results in Epic.  Imaging: ECHOCARDIOGRAM COMPLETE  Result Date: 02/22/2022    ECHOCARDIOGRAM REPORT   Patient Name:   Karen Bennett Date of Exam: 02/22/2022 Medical Rec #:  [id number]       Height:       66.0 in Accession #:    [id number]      Weight:       420.0 lb Date of Birth:  02-Dec-1975      BSA:          2.741 m Patient Age:    45 years        BP:           147/88 mmHg Patient Gender: F               HR:           77 bpm. Exam Location:  Inpatient Procedure: 2D Echo, Cardiac Doppler, Color Doppler and Intracardiac            Opacification Agent Indications:    Bacteremia R78.81  History:        Patient has prior history of Echocardiogram examinations, most                 recent 09/06/2021. Risk Factors:Diabetes. Past history of                 infective endocarditis, fungemia. Thyroid disease.  Sonographer:    Leta Jungling RDCS Referring Phys: [id number] Kathlynn Grate IMPRESSIONS  1. Left ventricular ejection fraction, by estimation,  is 55%. The  left ventricle has normal function. The left ventricle has no regional wall motion abnormalities. Septal bounce was noted. Left ventricular diastolic parameters are consistent with Grade I diastolic dysfunction (impaired relaxation).  2. Right ventricular systolic function is normal. The right ventricular size is normal. Tricuspid regurgitation signal is inadequate for assessing PA pressure.  3. The mitral valve was not well visualized. No evidence of mitral valve regurgitation. No evidence of mitral stenosis.  4. The aortic valve is tricuspid. Aortic valve regurgitation is not visualized. No aortic stenosis is present.  5. The inferior vena cava is normal in size with greater than 50% respiratory variability, suggesting right atrial pressure of 3 mmHg.  6. Technically difficult study with poor acoustic windows. If there is significant concern for endocarditis, would need TEE to clear . FINDINGS  Left Ventricle: Left ventricular ejection fraction, by estimation, is 55%. The left ventricle has normal function. The left ventricle has no regional wall motion abnormalities. Definity contrast agent was given IV to delineate the left ventricular endocardial borders. The left ventricular internal cavity size was normal in size. There is no left ventricular hypertrophy. Left ventricular diastolic parameters are consistent with Grade I diastolic dysfunction (impaired relaxation). Right Ventricle: The right ventricular size is normal. No increase in right ventricular wall thickness. Right ventricular systolic function is normal. Tricuspid regurgitation signal is inadequate for assessing PA pressure. Left Atrium: Left atrial size was normal in size. Right Atrium: Right atrial size was normal in size. Pericardium: There is no evidence of pericardial effusion. Mitral Valve: The mitral valve was not well visualized. No evidence of mitral valve regurgitation. No evidence of mitral valve stenosis. Tricuspid Valve: The tricuspid  valve is not well visualized. Tricuspid valve regurgitation is not demonstrated. Aortic Valve: The aortic valve is tricuspid. Aortic valve regurgitation is not visualized. No aortic stenosis is present. Pulmonic Valve: The pulmonic valve was not well visualized. Pulmonic valve regurgitation is not visualized. Aorta: The aortic root is normal in size and structure. Venous: The inferior vena cava is normal in size with greater than 50% respiratory variability, suggesting right atrial pressure of 3 mmHg. IAS/Shunts: No atrial level shunt detected by color flow Doppler.  LEFT VENTRICLE PLAX 2D LVIDd:         5.50 cm      Diastology LVIDs:         3.90 cm      LV e' medial:    6.74 cm/s LV PW:         1.00 cm      LV E/e' medial:  10.5 LV IVS:        0.80 cm      LV e' lateral:   10.60 cm/s LVOT diam:     2.10 cm      LV E/e' lateral: 6.7 LV SV:         61 LV SV Index:   22 LVOT Area:     3.46 cm  LV Volumes (MOD) LV vol d, MOD A2C: 159.0 ml LV vol d, MOD A4C: 171.0 ml LV vol s, MOD A2C: 80.1 ml LV vol s, MOD A4C: 51.6 ml LV SV MOD A2C:     78.9 ml LV SV MOD A4C:     171.0 ml LV SV MOD BP:      105.0 ml RIGHT VENTRICLE RV S prime:     15.30 cm/s TAPSE (M-mode): 2.8 cm LEFT ATRIUM  Index        RIGHT ATRIUM           Index LA diam:        3.80 cm 1.39 cm/m   RA Area:     16.10 cm LA Vol (A2C):   37.4 ml 13.64 ml/m  RA Volume:   39.20 ml  14.30 ml/m LA Vol (A4C):   43.0 ml 15.67 ml/m LA Biplane Vol: 44.6 ml 16.27 ml/m  AORTIC VALVE LVOT Vmax:   113.00 cm/s LVOT Vmean:  74.700 cm/s LVOT VTI:    0.175 m  AORTA Ao Root diam: 2.90 cm Ao Asc diam:  3.50 cm MITRAL VALVE MV Area (PHT): 2.52 cm     SHUNTS MV Decel Time: 301 msec     Systemic VTI:  0.18 m MV E velocity: 71.10 cm/s   Systemic Diam: 2.10 cm MV A velocity: 100.00 cm/s MV E/A ratio:  0.71 Dalton McleanMD Electronically signed by Franki Monte Signature Date/Time: 02/22/2022/7:28:57 PM    Final    MR [id number] RIGHT WO CONTRAST  Result Date:  02/21/2022 CLINICAL DATA:  Right lower extremity pain and swelling for 3 days. History of remote trauma below-knee-amputation. EXAM: MR OF THE RIGHT FEMUR WITHOUT CONTRAST TECHNIQUE: Multiplanar, multisequence MR imaging of the right leg was performed. No intravenous contrast was administered. COMPARISON:  Radiograph 02/20/2022 FINDINGS: There is an intramedullary rod in the right femur with for distal screws. The lowest screw is fractured and appears loose on the radiographs. The supracondylar fracture appears largely ununited. Severe diffuse subcutaneous soft tissue swelling/edema/fluid consistent with severe cellulitis. I do not see a discrete drainable fluid collection to suggest a subcutaneous abscess. Diffuse myositis involving the calf musculature which is severely fatty atrophy. No findings suspicious for osteomyelitis or septic arthritis at the knee joint. IMPRESSION: 1. Diffuse subcutaneous soft tissue swelling/edema/fluid consistent with severe cellulitis. 2. No discrete drainable fluid collection to suggest a subcutaneous abscess. 3. No findings for osteomyelitis involving the tibial or fibular stump or septic arthritis at the knee joint. 4. The supracondylar femur fracture appears largely ununited. Electronically Signed   By: Marijo Sanes M.D.   On: 02/21/2022 13:25     Imaging independently reviewed in Epic.    Raynelle Highland for Infectious Disease [facility] [phone] pager 02/23/2022, 8:59 AM

## 2022-02-24 LAB — CULTURE, BLOOD (ROUTINE X 2): Special Requests: ADEQUATE

## 2022-02-25 LAB — CULTURE, BLOOD (ROUTINE X 2): Culture: NO GROWTH

## 2022-02-27 LAB — CULTURE, BLOOD (ROUTINE X 2)
Culture: NO GROWTH
Culture: NO GROWTH
Special Requests: ADEQUATE
Special Requests: ADEQUATE

## 2022-07-04 DIAGNOSIS — M79604 Pain in right leg: Secondary | ICD-10-CM

## 2023-01-11 ENCOUNTER — Other Ambulatory Visit: Payer: Self-pay

## 2023-01-11 ENCOUNTER — Emergency Department (HOSPITAL_COMMUNITY): Payer: 59

## 2023-01-11 ENCOUNTER — Inpatient Hospital Stay (HOSPITAL_COMMUNITY)
Admission: EM | Admit: 2023-01-11 | Discharge: 2023-01-16 | DRG: 291 | Disposition: A | Discharge status: Home / Self Care | Payer: 59 | Attending: Internal Medicine | Admitting: Internal Medicine

## 2023-01-11 ENCOUNTER — Encounter (HOSPITAL_COMMUNITY): Payer: Self-pay

## 2023-01-11 DIAGNOSIS — E1169 Type 2 diabetes mellitus with other specified complication: Secondary | ICD-10-CM | POA: Diagnosis present

## 2023-01-11 DIAGNOSIS — E1122 Type 2 diabetes mellitus with diabetic chronic kidney disease: Secondary | ICD-10-CM | POA: Diagnosis present

## 2023-01-11 DIAGNOSIS — Z86711 Personal history of pulmonary embolism: Secondary | ICD-10-CM

## 2023-01-11 DIAGNOSIS — Z833 Family history of diabetes mellitus: Secondary | ICD-10-CM | POA: Diagnosis not present

## 2023-01-11 DIAGNOSIS — I110 Hypertensive heart disease with heart failure: Principal | ICD-10-CM | POA: Diagnosis present

## 2023-01-11 DIAGNOSIS — I5033 Acute on chronic diastolic (congestive) heart failure: Secondary | ICD-10-CM | POA: Diagnosis present

## 2023-01-11 DIAGNOSIS — F32A Depression, unspecified: Secondary | ICD-10-CM | POA: Diagnosis present

## 2023-01-11 DIAGNOSIS — Z823 Family history of stroke: Secondary | ICD-10-CM

## 2023-01-11 DIAGNOSIS — E119 Type 2 diabetes mellitus without complications: Secondary | ICD-10-CM

## 2023-01-11 DIAGNOSIS — G4733 Obstructive sleep apnea (adult) (pediatric): Secondary | ICD-10-CM | POA: Diagnosis present

## 2023-01-11 DIAGNOSIS — E039 Hypothyroidism, unspecified: Secondary | ICD-10-CM | POA: Diagnosis present

## 2023-01-11 DIAGNOSIS — E785 Hyperlipidemia, unspecified: Secondary | ICD-10-CM | POA: Diagnosis present

## 2023-01-11 DIAGNOSIS — I340 Nonrheumatic mitral (valve) insufficiency: Secondary | ICD-10-CM | POA: Diagnosis present

## 2023-01-11 DIAGNOSIS — E1159 Type 2 diabetes mellitus with other circulatory complications: Secondary | ICD-10-CM | POA: Diagnosis present

## 2023-01-11 DIAGNOSIS — Z794 Long term (current) use of insulin: Secondary | ICD-10-CM

## 2023-01-11 DIAGNOSIS — Z79890 Hormone replacement therapy (postmenopausal): Secondary | ICD-10-CM | POA: Diagnosis not present

## 2023-01-11 DIAGNOSIS — Z8249 Family history of ischemic heart disease and other diseases of the circulatory system: Secondary | ICD-10-CM | POA: Diagnosis not present

## 2023-01-11 DIAGNOSIS — Z6841 Body Mass Index (BMI) 40.0 and over, adult: Secondary | ICD-10-CM

## 2023-01-11 DIAGNOSIS — Z7901 Long term (current) use of anticoagulants: Secondary | ICD-10-CM | POA: Diagnosis not present

## 2023-01-11 DIAGNOSIS — Z881 Allergy status to other antibiotic agents status: Secondary | ICD-10-CM

## 2023-01-11 DIAGNOSIS — I5023 Acute on chronic systolic (congestive) heart failure: Secondary | ICD-10-CM | POA: Diagnosis present

## 2023-01-11 DIAGNOSIS — Z888 Allergy status to other drugs, medicaments and biological substances status: Secondary | ICD-10-CM

## 2023-01-11 DIAGNOSIS — Z79899 Other long term (current) drug therapy: Secondary | ICD-10-CM

## 2023-01-11 DIAGNOSIS — N179 Acute kidney failure, unspecified: Secondary | ICD-10-CM | POA: Diagnosis not present

## 2023-01-11 DIAGNOSIS — F419 Anxiety disorder, unspecified: Secondary | ICD-10-CM | POA: Diagnosis present

## 2023-01-11 DIAGNOSIS — I5031 Acute diastolic (congestive) heart failure: Secondary | ICD-10-CM | POA: Diagnosis not present

## 2023-01-11 DIAGNOSIS — I152 Hypertension secondary to endocrine disorders: Secondary | ICD-10-CM | POA: Diagnosis present

## 2023-01-11 DIAGNOSIS — Z885 Allergy status to narcotic agent status: Secondary | ICD-10-CM

## 2023-01-11 DIAGNOSIS — I509 Heart failure, unspecified: Secondary | ICD-10-CM | POA: Diagnosis present

## 2023-01-11 DIAGNOSIS — N1831 Chronic kidney disease, stage 3a: Secondary | ICD-10-CM

## 2023-01-11 DIAGNOSIS — Z89511 Acquired absence of right leg below knee: Secondary | ICD-10-CM

## 2023-01-11 DIAGNOSIS — Z8679 Personal history of other diseases of the circulatory system: Secondary | ICD-10-CM

## 2023-01-11 HISTORY — DX: Hypothyroidism, unspecified: E03.9

## 2023-01-11 HISTORY — DX: Chronic kidney disease, stage 3a: N18.31

## 2023-01-11 LAB — BASIC METABOLIC PANEL
Anion gap: 5 (ref 5–15)
BUN: 25 mg/dL — ABNORMAL HIGH (ref 6–20)
CO2: 22 mmol/L (ref 22–32)
Calcium: 8.5 mg/dL — ABNORMAL LOW (ref 8.9–10.3)
Chloride: 107 mmol/L (ref 98–111)
Creatinine, Ser: 1.37 mg/dL — ABNORMAL HIGH (ref 0.44–1.00)
GFR, Estimated: 49 mL/min — ABNORMAL LOW (ref >60)
Glucose, Bld: 259 mg/dL — ABNORMAL HIGH (ref 70–99)
Potassium: 4.8 mmol/L (ref 3.5–5.1)
Sodium: 134 mmol/L — ABNORMAL LOW (ref 135–145)

## 2023-01-11 LAB — CBC
HCT: 39.2 % (ref 36.0–46.0)
Hemoglobin: 12.5 g/dL (ref 12.0–15.0)
MCH: 29.4 pg (ref 26.0–34.0)
MCHC: 31.9 g/dL (ref 30.0–36.0)
MCV: 92.2 fL (ref 80.0–100.0)
Platelets: 282 10*3/uL (ref 150–400)
RBC: 4.25 MIL/uL (ref 3.87–5.11)
RDW: 13.1 % (ref 11.5–15.5)
WBC: 6.7 10*3/uL (ref 4.0–10.5)
nRBC: 0 % (ref 0.0–0.2)

## 2023-01-11 LAB — BRAIN NATRIURETIC PEPTIDE: B Natriuretic Peptide: 241.8 pg/mL — ABNORMAL HIGH (ref 0.0–100.0)

## 2023-01-11 LAB — CBG MONITORING, ED: Glucose-Capillary: 235 mg/dL — ABNORMAL HIGH (ref 70–99)

## 2023-01-11 MED ORDER — SODIUM CHLORIDE 0.9% FLUSH
3.0000 mL | Freq: Two times a day (BID) | INTRAVENOUS | Status: DC
  Administered 2023-01-12 – 2023-01-16 (×8): 3 mL via INTRAVENOUS

## 2023-01-11 MED ORDER — METOPROLOL TARTRATE 25 MG PO TABS
12.5000 mg | ORAL_TABLET | Freq: Two times a day (BID) | ORAL | Status: DC
  Administered 2023-01-12: 12.5 mg via ORAL
  Filled 2023-01-11 (×2): qty 1

## 2023-01-11 MED ORDER — INSULIN ASPART 100 UNIT/ML IJ SOLN
0.0000 [IU] | Freq: Three times a day (TID) | INTRAMUSCULAR | Status: DC
  Administered 2023-01-12: 3 [IU] via SUBCUTANEOUS
  Administered 2023-01-12: 5 [IU] via SUBCUTANEOUS
  Administered 2023-01-12 – 2023-01-13 (×3): 3 [IU] via SUBCUTANEOUS
  Administered 2023-01-13: 1 [IU] via SUBCUTANEOUS
  Filled 2023-01-11: qty 0.15

## 2023-01-11 MED ORDER — TRAZODONE HCL 50 MG PO TABS
150.0000 mg | ORAL_TABLET | Freq: Every day | ORAL | Status: DC
  Administered 2023-01-12 – 2023-01-14 (×4): 150 mg via ORAL
  Filled 2023-01-11 (×5): qty 1

## 2023-01-11 MED ORDER — BUPROPION HCL ER (SR) 100 MG PO TB12
100.0000 mg | ORAL_TABLET | Freq: Every morning | ORAL | Status: DC
  Administered 2023-01-12 – 2023-01-16 (×5): 100 mg via ORAL
  Filled 2023-01-11 (×5): qty 1

## 2023-01-11 MED ORDER — HYDROCODONE-ACETAMINOPHEN 5-325 MG PO TABS
1.0000 | ORAL_TABLET | ORAL | Status: DC | PRN
  Administered 2023-01-11: 2 via ORAL
  Administered 2023-01-12: 1 via ORAL
  Administered 2023-01-12: 2 via ORAL
  Administered 2023-01-12: 1 via ORAL
  Administered 2023-01-13: 2 via ORAL
  Administered 2023-01-13: 1 via ORAL
  Administered 2023-01-14 – 2023-01-15 (×4): 2 via ORAL
  Administered 2023-01-15: 1 via ORAL
  Administered 2023-01-15: 2 via ORAL
  Administered 2023-01-16 (×2): 1 via ORAL
  Filled 2023-01-11 (×2): qty 2
  Filled 2023-01-11 (×2): qty 1
  Filled 2023-01-11 (×3): qty 2
  Filled 2023-01-11 (×2): qty 1
  Filled 2023-01-11: qty 2
  Filled 2023-01-11 (×2): qty 1
  Filled 2023-01-11 (×3): qty 2

## 2023-01-11 MED ORDER — ACETAMINOPHEN 325 MG PO TABS: 650.0000 mg | ORAL_TABLET | Freq: Four times a day (QID) | ORAL | Status: DC | PRN

## 2023-01-11 MED ORDER — FUROSEMIDE 10 MG/ML IJ SOLN
60.0000 mg | Freq: Two times a day (BID) | INTRAMUSCULAR | Status: DC
  Administered 2023-01-12 (×2): 60 mg via INTRAVENOUS
  Filled 2023-01-11 (×2): qty 6

## 2023-01-11 MED ORDER — FUROSEMIDE 10 MG/ML IJ SOLN
40.0000 mg | Freq: Once | INTRAMUSCULAR | Status: AC
  Administered 2023-01-11: 40 mg via INTRAVENOUS
  Filled 2023-01-11: qty 4

## 2023-01-11 MED ORDER — LEVOTHYROXINE SODIUM 50 MCG PO TABS
50.0000 ug | ORAL_TABLET | Freq: Every day | ORAL | Status: DC
  Administered 2023-01-12 – 2023-01-16 (×5): 50 ug via ORAL
  Filled 2023-01-11 (×5): qty 1

## 2023-01-11 MED ORDER — ATORVASTATIN CALCIUM 40 MG PO TABS
40.0000 mg | ORAL_TABLET | Freq: Every day | ORAL | Status: DC
  Administered 2023-01-12 – 2023-01-16 (×5): 40 mg via ORAL
  Filled 2023-01-11 (×5): qty 1

## 2023-01-11 MED ORDER — ACETAMINOPHEN 500 MG PO TABS
1000.0000 mg | ORAL_TABLET | Freq: Once | ORAL | Status: AC
  Administered 2023-01-11: 1000 mg via ORAL
  Filled 2023-01-11: qty 2

## 2023-01-11 MED ORDER — ACETAMINOPHEN 650 MG RE SUPP: 650.0000 mg | Freq: Four times a day (QID) | RECTAL | Status: DC | PRN

## 2023-01-11 MED ORDER — RIVAROXABAN 20 MG PO TABS
20.0000 mg | ORAL_TABLET | Freq: Every day | ORAL | Status: DC
  Administered 2023-01-12 – 2023-01-16 (×5): 20 mg via ORAL
  Filled 2023-01-11 (×5): qty 1

## 2023-01-11 MED ORDER — INSULIN GLARGINE-YFGN 100 UNIT/ML ~~LOC~~ SOLN
30.0000 [IU] | Freq: Every day | SUBCUTANEOUS | Status: DC
  Administered 2023-01-12: 30 [IU] via SUBCUTANEOUS
  Filled 2023-01-11 (×2): qty 0.3

## 2023-01-11 MED ORDER — SENNOSIDES-DOCUSATE SODIUM 8.6-50 MG PO TABS: 1.0000 | ORAL_TABLET | Freq: Every evening | ORAL | Status: DC | PRN

## 2023-01-11 MED ORDER — NITROGLYCERIN 0.4 MG SL SUBL
0.4000 mg | SUBLINGUAL_TABLET | SUBLINGUAL | Status: DC | PRN
Start: 2023-01-11 — End: 2023-01-16

## 2023-01-11 MED ORDER — POTASSIUM CHLORIDE ER 10 MEQ PO TBCR
10.0000 meq | EXTENDED_RELEASE_TABLET | Freq: Every day | ORAL | Status: DC
  Administered 2023-01-12 – 2023-01-15 (×4): 10 meq via ORAL
  Filled 2023-01-11 (×8): qty 1

## 2023-01-11 MED ORDER — INSULIN ASPART 100 UNIT/ML IJ SOLN
0.0000 [IU] | Freq: Every day | INTRAMUSCULAR | Status: DC
  Administered 2023-01-11: 2 [IU] via SUBCUTANEOUS
  Filled 2023-01-11: qty 0.05

## 2023-01-11 MED ORDER — PROMETHAZINE HCL 25 MG PO TABS
12.5000 mg | ORAL_TABLET | Freq: Four times a day (QID) | ORAL | Status: DC | PRN
  Administered 2023-01-14: 12.5 mg via ORAL
  Filled 2023-01-11: qty 1

## 2023-01-11 NOTE — ED Notes (Signed)
ED TO INPATIENT HANDOFF REPORT   S Name/Age/Gender Karen Bennett 47 y.o. female Room/Bed: WA13/WA13  Code Status   Code Status: Full Code  Home/SNF/Other Home Patient oriented to: self, place, time, and situation Is this baseline? Yes   Triage Complete: Triage complete  Chief Complaint Acute on chronic heart failure with preserved ejection fraction (HFpEF) (HCC) [I50.33]  Triage Note Patient brought in by EMS due to shortness of breath that started around 0430 this morning. Patient denies chest pain, no recent surgeries or any other complaints.   Allergies Allergies  Allergen Reactions   Clindamycin Diarrhea and Nausea And Vomiting   Zofran Itching and Nausea And Vomiting   Doxycycline Diarrhea and Nausea And Vomiting   Morphine And Codeine Hives, Itching and Rash    Level of Care/Admitting Diagnosis ED Disposition     ED Disposition  Admit   Condition  --   Comment  Hospital Area: [facility] [[id number]]  Level of Care: Telemetry [5]  Admit to tele based on following criteria: Acute CHF  May admit patient to [facility] or [facility] if equivalent level of care is available:: No  Covid Evaluation: Asymptomatic - no recent exposure (last 10 days) testing not required  Diagnosis: Acute on chronic heart failure with preserved ejection fraction (HFpEF) [facility]) [[id number]]  Admitting Physician: Charlsie Quest [[id number]]  Attending Physician: Charlsie Quest [[id number]]  Certification:: I certify this patient will need inpatient services for at least 2 midnights  Estimated Length of Stay: 2          B Medical/Surgery History Past Medical History:  Diagnosis Date   Anxiety and depression 06/26/2013   Cellulitis    Chronic kidney disease, stage 3a (HCC) 01/11/2023   Diabetes mellitus    Hyperlipidemia associated with type 2 diabetes mellitus (HCC) 05/02/2013   Hypertension associated with diabetes (HCC) 12/27/2020   Hypothyroidism    Infective  endocarditis    PE (pulmonary embolism) ~2007-2008   Not on anticoagulation   Past Surgical History:  Procedure Laterality Date   Abscess removal from L groin     APPLICATION OF WOUND VAC Left 05/01/2013   Procedure: APPLICATION OF WOUND VAC;  Surgeon: Kerrin Champagne, MD;  Location: [facility];  Service: Orthopedics;  Laterality: Left;   BELOW KNEE LEG AMPUTATION Right    CYSTOSCOPY W/ URETERAL STENT PLACEMENT Right 09/03/2021   Procedure: CYSTOSCOPY WITH RETROGRADE PYELOGRAM/URETERAL STENT PLACEMENT;  Surgeon: Marcine Matar, MD;  Location: [facility];  Service: Urology;  Laterality: Right;   I & D EXTREMITY  11/12/2011   Procedure: IRRIGATION AND DEBRIDEMENT EXTREMITY;  Surgeon: Kathryne Hitch, MD;  Location: [facility];  Service: Orthopedics;  Laterality: Left;  foot left   I & D EXTREMITY Left 09/06/2012   Procedure: IRRIGATION AND DEBRIDEMENT EXTREMITY;  Surgeon: Eldred Manges, MD;  [facility]: [facility];  Service: Orthopedics;  Laterality: Left;   I & D EXTREMITY Left 05/01/2013   Procedure: INCISION AND DRAINAGE LEFT FOREFOOT ABCESS ;  Surgeon: Kerrin Champagne, MD;  Location: [facility];  Service: Orthopedics;  Laterality: Left;   INCISION AND DRAINAGE ABSCESS Left 12/27/2020   Procedure: INCISION AND DRAINAGE LEFT THIGH ABSCESS;  Surgeon: Berna Bue, MD;  Location: [facility];  Service: General;  Laterality: Left;   IR FLUORO GUIDE CV LINE RIGHT  09/12/2021   IR PTA VENOUS EXCEPT DIALYSIS CIRCUIT  01/04/2021   IR RADIOLOGIST EVAL & MGMT  10/25/2021   IR US GUIDE  VASC ACCESS RIGHT  09/12/2021   IR VENO/EXT/UNI RIGHT  01/04/2021   Surgery to remove hematoma in L leg     TEE WITHOUT CARDIOVERSION N/A 01/03/2021   Procedure: TRANSESOPHAGEAL ECHOCARDIOGRAM (TEE);  Surgeon: Quintella Reichert, MD;  Location: [facility] ENDOSCOPY;  Service: Cardiovascular;  Laterality: N/A;   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     A IV Location/Drains/Wounds Patient Lines/Drains/Airways Status     Active  Line/Drains/Airways     Name Placement date Placement time Site Days   Peripheral IV 01/11/23 20 G Left Antecubital 01/11/23  1517  Antecubital  less than 1   Ureteral Drain/Stent Right ureter 6 Fr. 09/03/21  1743  Right ureter  495   External Urinary Catheter 01/11/23  1827  --  less than 1            Intake/Output Last 24 hours No intake or output data in the 24 hours ending 01/11/23 2223  Labs/Imaging Results for orders placed or performed during the hospital encounter of 01/11/23 (from the past 48 hour(s))  Basic metabolic panel     Status: Abnormal   Collection Time: 01/11/23  3:14 PM  Result Value Ref Range   Sodium 134 (L) 135 - 145 mmol/L   Potassium 4.8 3.5 - 5.1 mmol/L   Chloride 107 98 - 111 mmol/L   CO2 22 22 - 32 mmol/L   Glucose, Bld 259 (H) 70 - 99 mg/dL    Comment: Glucose reference range applies only to samples taken after fasting for at least 8 hours.   BUN 25 (H) 6 - 20 mg/dL   Creatinine, Ser 1.61 (H) 0.44 - 1.00 mg/dL   Calcium 8.5 (L) 8.9 - 10.3 mg/dL   GFR, Estimated 49 (L) >60 mL/min    Comment: (NOTE) Calculated using the CKD-EPI Creatinine Equation (2021)    Anion gap 5 5 - 15    Comment: Performed at [facility], [address], [place], [place] [address]  CBC     Status: None   Collection Time: 01/11/23  3:14 PM  Result Value Ref Range   WBC 6.7 4.0 - 10.5 K/uL   RBC 4.25 3.87 - 5.11 MIL/uL   Hemoglobin 12.5 12.0 - 15.0 g/dL   HCT 54.0 98.1 - 19.1 %   MCV 92.2 80.0 - 100.0 fL   MCH 29.4 26.0 - 34.0 pg   MCHC 31.9 30.0 - 36.0 g/dL   RDW 47.8 29.5 - 62.1 %   Platelets 282 150 - 400 K/uL   nRBC 0.0 0.0 - 0.2 %    Comment: Performed at [facility], [address] [place], [place] [address]  Brain natriuretic peptide     Status: Abnormal   Collection Time: 01/11/23  3:14 PM  Result Value Ref Range   B Natriuretic Peptide 241.8 (H) 0.0 - 100.0 pg/mL    Comment: Performed at [facility], [address], [place], [place] [address]  CBG monitoring, ED     Status: Abnormal   Collection Time: 01/11/23 10:02 PM  Result Value Ref Range   Glucose-Capillary 235 (H) 70 - 99 mg/dL    Comment: Glucose reference range applies only to samples taken after fasting for at least 8 hours.   DG Chest 2 View  Result Date: 01/11/2023 CLINICAL DATA:  COPD EXAM: CHEST - 2 VIEW COMPARISON:  CXR 10/24/21 FINDINGS: Cardiomegaly. Hazy opacity at the right lung base could represent  a combination of a layering pleural effusion and atelectasis or infection. No radiographically apparent displaced fractures. Visualized upper abdomen is unremarkable. Vertebral body heights are maintained. IMPRESSION: Cardiomegaly with a layering right pleural effusion and a superimposed airspace opacity that could represent atelectasis or infection. Electronically Signed   By: Lorenza Cambridge M.D.   On: 01/11/2023 15:41    Pending Labs Unresulted Labs (From admission, onward)     Start     Ordered   01/12/23 0500  Magnesium  Tomorrow morning,   R        01/11/23 2031   01/12/23 0500  CBC  Tomorrow morning,   R        01/11/23 2031   01/12/23 0500  Basic metabolic panel  Tomorrow morning,   R        01/11/23 2031   01/12/23 0500  Hemoglobin A1c  Tomorrow morning,   R       Comments: To assess prior glycemic control    01/11/23 2032   01/11/23 1620  Brain natriuretic peptide  Add-on,   AD        01/11/23 1619            Vitals/Pain Today's Vitals   01/11/23 1940 01/11/23 1945 01/11/23 1950 01/11/23 2132  BP:  (!) 172/75    Pulse: 63 64 63   Resp:  18    Temp:      TempSrc:      SpO2: 95% 91% 91%   Weight:      Height:      PainSc:    7     Isolation Precautions No active isolations  Medications Medications  nitroGLYCERIN (NITROSTAT) SL tablet 0.4 mg (has no administration in time range)  furosemide (LASIX) injection 60 mg (has no administration in time range)  sodium chloride flush (NS)  0.9 % injection 3 mL (has no administration in time range)  acetaminophen (TYLENOL) tablet 650 mg (has no administration in time range)    Or  acetaminophen (TYLENOL) suppository 650 mg (has no administration in time range)  HYDROcodone-acetaminophen (NORCO/VICODIN) 5-325 MG per tablet 1-2 tablet (2 tablets Oral Given 01/11/23 2132)  senna-docusate (Senokot-S) tablet 1 tablet (has no administration in time range)  promethazine (PHENERGAN) tablet 12.5 mg (has no administration in time range)  insulin glargine-yfgn (SEMGLEE) injection 30 Units (has no administration in time range)  insulin aspart (novoLOG) injection 0-15 Units (has no administration in time range)  insulin aspart (novoLOG) injection 0-5 Units (has no administration in time range)  atorvastatin (LIPITOR) tablet 40 mg (has no administration in time range)  buPROPion ER (WELLBUTRIN SR) 12 hr tablet 100 mg (has no administration in time range)  levothyroxine (SYNTHROID) tablet 50 mcg (has no administration in time range)  metoprolol tartrate (LOPRESSOR) tablet 12.5 mg (has no administration in time range)  potassium chloride (KLOR-CON) CR tablet 10 mEq (has no administration in time range)  traZODone (DESYREL) tablet 150 mg (has no administration in time range)  rivaroxaban (XARELTO) tablet 20 mg (has no administration in time range)  furosemide (LASIX) injection 40 mg (40 mg Intravenous Given 01/11/23 1821)  acetaminophen (TYLENOL) tablet 1,000 mg (1,000 mg Oral Given 01/11/23 1821)    Mobility power wheelchair     Focused Assessments    R Recommendations: See Admitting Provider Note  Report given to:   Additional Notes:

## 2023-01-11 NOTE — H&P (Signed)
History and Physical    MISHAY LUCKS ZOX:[id number] DOB: [birth year] DOA: 01/11/2023  PCP: [name], NP  Patient coming from: Home  I have personally briefly reviewed patient's old medical records in [facility]  Chief Complaint: Shortness of breath  HPI: Karen Bennett is a 47 y.o. female with medical history significant for insulin-dependent T2DM, HTN, CKD stage IIIa, HLD, hypothyroidism, prior PE on Xarelto, s/p right BKA, history of endocarditis, depression/anxiety who presented to the ED for evaluation of shortness of breath.  Patient states she woke up this morning feeling short of breath.  She has noticed that her dyspnea is worse when she lays flat.  She has been experiencing some midsternal chest discomfort described as a pulling sensation without radiation.  She has not had any diaphoresis, nausea, fevers, chills, or cough.  She has noticed increasing swelling to her left leg over the last week.  She reports good urine output.  She reports adherence to Xarelto.  She denies any similar chest discomfort in the past.  ED Course  Labs/Imaging on admission: I have personally reviewed following labs and imaging studies.  Initial vitals showed BP 177/101, pulse 98, RR 18, temp 98.5 F, SpO2 92% on room air.  Labs show sodium 134, potassium 4.8, bicarb 22, BUN 25, creatinine 1.37, serum glucose 259, WBC 6.7, hemoglobin 12.5, platelets 282,000, BNP 241.8.  2 view chest x-ray showed cardiomegaly with layering right pleural effusion and superimposed airspace opacity.  Patient was given IV Lasix 40 mg.  The hospitalist service was consulted to admit for further evaluation and management.  Review of Systems: All systems reviewed and are negative except as documented in history of present illness above.   Past Medical History:  Diagnosis Date   Anxiety and depression 06/26/2013   Cellulitis    Chronic kidney disease, stage 3a (HCC) 01/11/2023   Diabetes mellitus     Hyperlipidemia associated with type 2 diabetes mellitus (HCC) 05/02/2013   Hypertension associated with diabetes (HCC) 12/27/2020   Hypothyroidism    Infective endocarditis    PE (pulmonary embolism) ~2007-2008   Not on anticoagulation    Past Surgical History:  Procedure Laterality Date   Abscess removal from L groin     APPLICATION OF WOUND VAC Left 05/01/2013   Procedure: APPLICATION OF WOUND VAC;  Surgeon: Kerrin Champagne, MD;  [facility]: [facility];  Service: Orthopedics;  Laterality: Left;   BELOW KNEE LEG AMPUTATION Right    CYSTOSCOPY W/ URETERAL STENT PLACEMENT Right 09/03/2021   Procedure: CYSTOSCOPY WITH RETROGRADE PYELOGRAM/URETERAL STENT PLACEMENT;  Surgeon: Marcine Matar, MD;  Location: [facility];  Service: Urology;  Laterality: Right;   I & D EXTREMITY  11/12/2011   Procedure: IRRIGATION AND DEBRIDEMENT EXTREMITY;  Surgeon: Kathryne Hitch, MD;  Location: [facility];  Service: Orthopedics;  Laterality: Left;  foot left   I & D EXTREMITY Left 09/06/2012   Procedure: IRRIGATION AND DEBRIDEMENT EXTREMITY;  Surgeon: Eldred Manges, MD;  [facility]: [facility];  Service: Orthopedics;  Laterality: Left;   I & D EXTREMITY Left 05/01/2013   Procedure: INCISION AND DRAINAGE LEFT FOREFOOT ABCESS ;  Surgeon: Kerrin Champagne, MD;  [facility]: [facility];  Service: Orthopedics;  Laterality: Left;   INCISION AND DRAINAGE ABSCESS Left 12/27/2020   Procedure: INCISION AND DRAINAGE LEFT THIGH ABSCESS;  Surgeon: Berna Bue, MD;  [facility]: [facility];  Service: General;  Laterality: Left;   IR FLUORO GUIDE CV LINE RIGHT  09/12/2021   IR  PTA VENOUS EXCEPT DIALYSIS CIRCUIT  01/04/2021   IR RADIOLOGIST EVAL & MGMT  10/25/2021   IR US GUIDE VASC ACCESS RIGHT  09/12/2021   IR VENO/EXT/UNI RIGHT  01/04/2021   Surgery to remove hematoma in L leg     TEE WITHOUT CARDIOVERSION N/A 01/03/2021   Procedure: TRANSESOPHAGEAL ECHOCARDIOGRAM (TEE);  Surgeon: Quintella Reichert, MD;  Location: [facility] ENDOSCOPY;  Service:  Cardiovascular;  Laterality: N/A;   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    Social History:  reports that she has never smoked. She has never used smokeless tobacco. She reports that she does not drink alcohol and does not use drugs.  Allergies  Allergen Reactions   Clindamycin Diarrhea and Nausea And Vomiting   Zofran Itching and Nausea And Vomiting   Doxycycline Diarrhea and Nausea And Vomiting   Morphine And Codeine Hives, Itching and Rash    Family History  Problem Relation Age of Onset   Diabetes Mother    Coronary artery disease Mother    Stroke Mother      Prior to Admission medications   Medication Sig Start Date End Date Taking? Authorizing Provider  acetaminophen (TYLENOL) 325 MG tablet Take 2 tablets (650 mg total) by mouth every 6 (six) hours as needed for mild pain or moderate pain. 02/09/21   Hollice Espy, MD  atorvastatin (LIPITOR) 40 MG tablet Take 1 tablet (40 mg total) by mouth daily at 6 PM. 05/12/13   Buriev, Isaiah Serge, MD  BIOTIN PO Take 2 capsules by mouth daily. Unsure of dose    [provider]  buPROPion ER (WELLBUTRIN SR) 100 MG 12 hr tablet Take 100 mg by mouth every morning. 05/09/21   [provider]  collagenase (SANTYL) ointment Apply topically daily. To left leg Patient not taking: Reported on 09/03/2021 01/04/21   Zannie Cove, MD  diazepam (VALIUM) 5 MG tablet Take 5 mg by mouth every 8 (eight) hours as needed for anxiety. 03/30/21   [provider]  furosemide (LASIX) 40 MG tablet Take 40 mg by mouth daily. 11/12/20   [provider]  glucose blood test strip Use as instructed 06/26/13   Advani, Ayesha Rumpf, MD  glucose monitoring kit (FREESTYLE) monitoring kit 1 each by Does not apply route as needed for other. 06/26/13   Doris Cheadle, MD  HUMALOG KWIKPEN 100 UNIT/ML KwikPen 0-12 Units 3 (three) times daily. Inject subcutaneously twice daily as per sliding scale: if 70-150= 0 units; 151-200= 2 u; 201-250= 4 u;  251-300= 6 u; 301-350= 8 u; 351-400= 10 u; 401-450= 12u; anything greater than 400 call physician. 10/27/20   [provider]  hydrOXYzine (VISTARIL) 50 MG capsule Take 50 mg by mouth every 8 (eight) hours as needed for itching.    [provider]  insulin glargine (SEMGLEE, YFGN,) 100 UNIT/ML Solostar Pen Inject 50 Units into the skin at bedtime. 03/30/21   [provider]  levothyroxine (SYNTHROID) 50 MCG tablet Take 1 tablet (50 mcg total) by mouth daily at 6 (six) AM. 10/26/21   Leroy Sea, MD  metoCLOPramide (REGLAN) 5 MG tablet Take 1 tablet (5 mg total) by mouth every 6 (six) hours as needed for nausea. 02/09/21   Hollice Espy, MD  metoprolol tartrate (LOPRESSOR) 25 MG tablet Take 0.5 tablets (12.5 mg total) by mouth 2 (two) times daily. 02/09/21   Hollice Espy, MD  oxyCODONE (ROXICODONE) 15 MG immediate release tablet Take 1 tablet (15  mg total) by mouth every 6 (six) hours as needed for pain. 09/13/21   Meredeth Ide, MD  potassium chloride (KLOR-CON) 10 MEQ tablet Take 10 mEq by mouth daily. 05/09/21   [provider]  promethazine (PHENERGAN) 25 MG tablet Take 25 mg by mouth 2 (two) times daily as needed for nausea/vomiting or nausea. 01/12/21   [provider]  traZODone (DESYREL) 150 MG tablet Take 1 tablet (150 mg total) by mouth at bedtime. 03/02/22   Lewie Chamber, MD  XARELTO 20 MG TABS tablet Take 20 mg by mouth every evening. 11/12/20   [provider]    Physical Exam: Vitals:   01/11/23 1935 01/11/23 1940 01/11/23 1945 01/11/23 1950  BP:   (!) 172/75   Pulse: 66 63 64 63  Resp:   18   Temp:      TempSrc:      SpO2: 92% 95% 91% 91%  Weight:      Height:       Constitutional: Morbidly obese woman sitting up in bed.  Calm, somewhat anxious Eyes: EOMI, lids and conjunctivae normal ENMT: Mucous membranes are moist. Posterior pharynx clear of any exudate or lesions.Normal dentition.  Neck: normal, supple, no  masses. Respiratory: clear to auscultation bilaterally, no wheezing, no crackles. Normal respiratory effort. No accessory muscle use.  Cardiovascular: Regular rate and rhythm, no murmurs / rubs / gallops.  Large lower extremities, limited evaluation due to body habitus Abdomen: no tenderness, no masses palpated.  Musculoskeletal: S/p right BKA, no clubbing / cyanosis. No joint deformity upper and lower extremities.  Skin: no rashes, lesions, ulcers. No induration Neurologic: Sensation intact. Strength equal bilaterally. Psychiatric: Alert and oriented x 3. Normal mood.   EKG: Personally reviewed. Sinus rhythm, rate 89, RBBB and LPFB.  Rate is slower when compared to prior.  Assessment/Plan Principal Problem:   Acute on chronic heart failure with preserved ejection fraction (HFpEF) (HCC) Active Problems:   Hyperlipidemia associated with type 2 diabetes mellitus (HCC)   Diabetes mellitus type 2, insulin dependent (HCC)   Chronic kidney disease, stage 3a (HCC)   History of pulmonary embolism   Anxiety and depression   Hypertension associated with diabetes (HCC)   Hypothyroidism   Karen Bennett is a 47 y.o. female with medical history significant for insulin-dependent T2DM, HTN, CKD stage IIIa, HLD, hypothyroidism, prior PE on Xarelto, s/p right BKA, history of endocarditis, depression/anxiety who is admitted with acute on chronic HFpEF.  Assessment and Plan: Acute on chronic HFpEF with right pleural effusion: Presenting with PND, orthopnea, increased peripheral edema from baseline although exam limited due to large body habitus.  CXR with right pleural effusion.  BNP 241.8.  Lower suspicion for pneumonia given no leukocytosis and patient is afebrile.  Last TTE 02/2022 showed EF 55%, G1DD.  She is reporting midsternal chest discomfort. -Start IV Lasix 60 mg twice daily -Update echocardiogram -Check troponin, keep on telemetry -Strict I/O's and daily weights -Continue Lopressor 12.5 mg  twice daily  Insulin-dependent type 2 diabetes: Placed on Semglee 30 units plus SSI.  CKD stage IIIa: Renal function relatively stable.  Continue to monitor.  Hypertension: Continue Lopressor.  History of PE: Continue Xarelto.  Hypothyroidism: Continue Synthroid.  Hyperlipidemia: Continue atorvastatin.  Depression/anxiety: Continue Wellbutrin and trazodone.  Morbid obesity: Body mass index is 67.79 kg/m.    DVT prophylaxis:  rivaroxaban (XARELTO) tablet 20 mg   Code Status: Full code, confirmed with patient on admission Family Communication: Discussed with patient, she has  discussed with family Disposition Plan: Pending clinical progress Consults called: None Severity of Illness: The appropriate patient status for this patient is INPATIENT. Inpatient status is judged to be reasonable and necessary in order to provide the required intensity of service to ensure the patient's safety. The patient's presenting symptoms, physical exam findings, and initial radiographic and laboratory data in the context of their chronic comorbidities is felt to place them at high risk for further clinical deterioration. Furthermore, it is not anticipated that the patient will be medically stable for discharge from the hospital within 2 midnights of admission.   * I certify that at the point of admission it is my clinical judgment that the patient will require inpatient hospital care spanning beyond 2 midnights from the point of admission due to high intensity of service, high risk for further deterioration and high frequency of surveillance required.Darreld Mclean MD [facility]  If 7PM-7AM, please contact night-coverage [email or web address]  01/11/2023, 10:13 PM

## 2023-01-11 NOTE — ED Provider Notes (Signed)
[facility] Provider Note   CSN: [id number] Arrival date & time: 01/11/23  1505     History  Chief Complaint  Patient presents with   Shortness of Breath    Karen Bennett is a 47 y.o. female.  The history is provided by the patient and medical records. No language interpreter was used.  Shortness of Breath    47 year old female significant history of diabetes, prior history of PE on Xarelto, anxiety depression who was brought here via EMS from complaints of shortness of breath.  Patient states for the past 2 weeks she has noticed increased fluid retention in her legs.  She also felt a lump to the left side of her chest that is mildly tender for the past week.  Throughout the day today she endorsed having shortness of breath.  Shortness of breath worse when she exerts herself or when she lays flat.  She does not endorse any fever chills productive cough hemoptysis nausea vomiting or diarrhea.  No history of CHF.  Has had history of PE in the past due to being on birth control and she is currently on Xarelto and have been compliant with her medication.  Denies any specific treatment tried at home  Home Medications Prior to Admission medications   Medication Sig Start Date End Date Taking? Authorizing Provider  acetaminophen (TYLENOL) 325 MG tablet Take 2 tablets (650 mg total) by mouth every 6 (six) hours as needed for mild pain or moderate pain. 02/09/21   Hollice Espy, MD  atorvastatin (LIPITOR) 40 MG tablet Take 1 tablet (40 mg total) by mouth daily at 6 PM. 05/12/13   Buriev, Isaiah Serge, MD  BIOTIN PO Take 2 capsules by mouth daily. Unsure of dose    [provider]  buPROPion ER (WELLBUTRIN SR) 100 MG 12 hr tablet Take 100 mg by mouth every morning. 05/09/21   [provider]  collagenase (SANTYL) ointment Apply topically daily. To left leg Patient not taking: Reported on 09/03/2021 01/04/21   Zannie Cove, MD   diazepam (VALIUM) 5 MG tablet Take 5 mg by mouth every 8 (eight) hours as needed for anxiety. 03/30/21   [provider]  furosemide (LASIX) 40 MG tablet Take 40 mg by mouth daily. 11/12/20   [provider]  glucose blood test strip Use as instructed 06/26/13   Advani, Ayesha Rumpf, MD  glucose monitoring kit (FREESTYLE) monitoring kit 1 each by Does not apply route as needed for other. 06/26/13   Doris Cheadle, MD  HUMALOG KWIKPEN 100 UNIT/ML KwikPen 0-12 Units 3 (three) times daily. Inject subcutaneously twice daily as per sliding scale: if 70-150= 0 units; 151-200= 2 u; 201-250= 4 u; 251-300= 6 u; 301-350= 8 u; 351-400= 10 u; 401-450= 12u; anything greater than 400 call physician. 10/27/20   [provider]  hydrOXYzine (VISTARIL) 50 MG capsule Take 50 mg by mouth every 8 (eight) hours as needed for itching.    [provider]  insulin glargine (SEMGLEE, YFGN,) 100 UNIT/ML Solostar Pen Inject 50 Units into the skin at bedtime. 03/30/21   [provider]  levothyroxine (SYNTHROID) 50 MCG tablet Take 1 tablet (50 mcg total) by mouth daily at 6 (six) AM. 10/26/21   Leroy Sea, MD  metoCLOPramide (REGLAN) 5 MG tablet Take 1 tablet (5 mg total) by mouth every 6 (six) hours as needed for nausea. 02/09/21   Hollice Espy, MD  metoprolol tartrate (LOPRESSOR) 25 MG  tablet Take 0.5 tablets (12.5 mg total) by mouth 2 (two) times daily. 02/09/21   Hollice Espy, MD  oxyCODONE (ROXICODONE) 15 MG immediate release tablet Take 1 tablet (15 mg total) by mouth every 6 (six) hours as needed for pain. 09/13/21   Meredeth Ide, MD  potassium chloride (KLOR-CON) 10 MEQ tablet Take 10 mEq by mouth daily. 05/09/21   [provider]  promethazine (PHENERGAN) 25 MG tablet Take 25 mg by mouth 2 (two) times daily as needed for nausea/vomiting or nausea. 01/12/21   [provider]  traZODone (DESYREL) 150 MG tablet Take 1 tablet (150 mg total) by mouth at bedtime.  03/02/22   Lewie Chamber, MD  XARELTO 20 MG TABS tablet Take 20 mg by mouth every evening. 11/12/20   [provider]      Allergies    Clindamycin, Zofran, Doxycycline, and Morphine and codeine    Review of Systems   Review of Systems  Respiratory:  Positive for shortness of breath.   All other systems reviewed and are negative.   Physical Exam Updated Vital Signs BP (!) 177/101   Pulse 98   Temp 98.5 F (36.9 C) (Oral)   Resp 18   Ht 5\' 6"  (1.676 m)   Wt (!) 190.5 kg   LMP 04/13/2013   SpO2 92%   BMI 67.79 kg/m  Physical Exam Vitals and nursing note reviewed.  Constitutional:      General: She is not in acute distress.    Appearance: She is well-developed. She is obese.     Comments: Morbidly obese resting comfortably in bed appears to be in no acute discomfort.  She is able to speak in complete sentences.  HENT:     Head: Atraumatic.  Eyes:     Conjunctiva/sclera: Conjunctivae normal.  Cardiovascular:     Rate and Rhythm: Normal rate and regular rhythm.  Pulmonary:     Effort: Pulmonary effort is normal.     Breath sounds: Examination of the right-middle field reveals rales. Examination of the right-lower field reveals rales. Decreased breath sounds and rales present. No wheezing.  Chest:     Chest wall: No tenderness.  Musculoskeletal:     Cervical back: Neck supple.     Right lower leg: Edema present.     Left lower leg: Edema present.  Skin:    Findings: No rash.  Neurological:     Mental Status: She is alert.  Psychiatric:        Mood and Affect: Mood normal.     ED Results / Procedures / Treatments   Labs (all labs ordered are listed, but only abnormal results are displayed) Labs Reviewed  BASIC METABOLIC PANEL - Abnormal; Notable for the following components:      Result Value   Sodium 134 (*)    Glucose, Bld 259 (*)    BUN 25 (*)    Creatinine, Ser 1.37 (*)    Calcium 8.5 (*)    GFR, Estimated 49 (*)    All other components within  normal limits  BRAIN NATRIURETIC PEPTIDE - Abnormal; Notable for the following components:   B Natriuretic Peptide 241.8 (*)    All other components within normal limits  CBC  BRAIN NATRIURETIC PEPTIDE    EKG EKG Interpretation  Date/Time:  Thursday January 11 2023 15:18:15 EDT Ventricular Rate:  89 PR Interval:  176 QRS Duration: 173 QT Interval:  431 QTC Calculation: 525 R Axis:   146 Text Interpretation:  Sinus rhythm RBBB and LPFB Since last tracing rate slower Confirmed by Linwood Dibbles [phone]) on 01/11/2023 3:20:05 PM  Radiology DG Chest 2 View  Result Date: 01/11/2023 CLINICAL DATA:  COPD EXAM: CHEST - 2 VIEW COMPARISON:  CXR 10/24/21 FINDINGS: Cardiomegaly. Hazy opacity at the right lung base could represent a combination of a layering pleural effusion and atelectasis or infection. No radiographically apparent displaced fractures. Visualized upper abdomen is unremarkable. Vertebral body heights are maintained. IMPRESSION: Cardiomegaly with a layering right pleural effusion and a superimposed airspace opacity that could represent atelectasis or infection. Electronically Signed   By: Lorenza Cambridge M.D.   On: 01/11/2023 15:41    Procedures Procedures    Medications Ordered in ED Medications  furosemide (LASIX) injection 40 mg (40 mg Intravenous Given 01/11/23 1821)  acetaminophen (TYLENOL) tablet 1,000 mg (1,000 mg Oral Given 01/11/23 1821)    ED Course/ Medical Decision Making/ A&P                             Medical Decision Making Amount and/or Complexity of Data Reviewed Labs: ordered. Radiology: ordered.  Risk OTC drugs. Prescription drug management.   BP (!) 177/101   Pulse 98   Temp 98.5 F (36.9 C) (Oral)   Resp 18   Ht 5\' 6"  (1.676 m)   Wt (!) 190.5 kg   LMP 04/13/2013   SpO2 92%   BMI 67.79 kg/m   21:32 PM  47 year old female significant history of diabetes, prior history of PE on Xarelto, anxiety depression who was brought here via EMS from complaints  of shortness of breath.  Patient states for the past 2 weeks she has noticed increased fluid retention in her legs.  She also felt a lump to the left side of her chest that is mildly tender for the past week.  Throughout the day today she endorsed having shortness of breath.  Shortness of breath worse when she exerts herself or when she lays flat.  She does not endorse any fever chills productive cough hemoptysis nausea vomiting or diarrhea.  No history of CHF.  Has had history of PE in the past due to being on birth control and she is currently on Xarelto and have been compliant with her medication.  Denies any specific treatment tried at home  On exam this is a morbidly obese female resting in bed appears to be in no acute respiratory discomfort.  Heart with normal rate and rhythm, lungs with diminished breath sounds to her right lower lung base with crackles.  Occasional faint wheezes heard.  No chest wall tenderness.  Difficult to fully appreciate edema due to her body habitus but legs edematous.  Vital signs notable for an O2 sat of 92%, will place on supplemental oxygen.  Blood pressure is elevated at 177/101.  She is afebrile.  Her BMI is 67.  EMR reviewed, last echo on August 2023 shows an ejection fractions of 55%.   -Labs ordered, independently viewed and interpreted by me.  Labs remarkable for Cr 1.37 similar to her baseline.  BNP 241 -The patient was maintained on a cardiac monitor.  I personally viewed and interpreted the cardiac monitored which showed an underlying rhythm of: NSR -Imaging independently viewed and interpreted by me and I agree with radiologist's interpretation.  Result remarkable for CRX right pleural effusion and a superimposed airspace opacity.  Since pt is without fever or productive cough, I doubt this is infection -  This patient presents to the ED for concern of sob, this involves an extensive number of treatment options, and is a complaint that carries with it a high risk  of complications and morbidity.  The differential diagnosis includes CHF exacerbation, PE, PNA, viral illness, anemia, asthma, copd -Co morbidities that complicate the patient evaluation includes DM, PE, obesity -Treatment includes lasix, tylenol -Reevaluation of the patient after these medicines showed that the patient improved -PCP office notes or outside notes reviewed -Discussion with specialist [facility] Dr. Allena Katz who agrees to see and will admit pt for diuresis -Escalation to admission/observation considered: patient is agreeable with admission.          Final Clinical Impression(s) / ED Diagnoses Final diagnoses:  Acute on chronic congestive heart failure, unspecified heart failure type [facility])    Rx / DC Orders ED Discharge Orders     None         Fayrene Helper, PA-C 01/11/23 Ocie Cornfield, MD 01/12/23 [phone]

## 2023-01-11 NOTE — ED Triage Notes (Signed)
Patient brought in by EMS due to shortness of breath that started around 0430 this morning. Patient denies chest pain, no recent surgeries or any other complaints.

## 2023-01-11 NOTE — Hospital Course (Signed)
Karen Bennett is a 47 y.o. female with medical history significant for insulin-dependent T2DM, HTN, CKD stage IIIa, HLD, hypothyroidism, prior PE on Xarelto, s/p right BKA, history of endocarditis, depression/anxiety who is admitted with acute on chronic HFpEF.

## 2023-01-12 ENCOUNTER — Inpatient Hospital Stay (HOSPITAL_COMMUNITY): Payer: 59

## 2023-01-12 ENCOUNTER — Other Ambulatory Visit (HOSPITAL_COMMUNITY): Payer: 59

## 2023-01-12 DIAGNOSIS — I5033 Acute on chronic diastolic (congestive) heart failure: Secondary | ICD-10-CM | POA: Diagnosis not present

## 2023-01-12 DIAGNOSIS — I5031 Acute diastolic (congestive) heart failure: Secondary | ICD-10-CM

## 2023-01-12 LAB — CBC
HCT: 34.8 % — ABNORMAL LOW (ref 36.0–46.0)
Hemoglobin: 11.1 g/dL — ABNORMAL LOW (ref 12.0–15.0)
MCH: 29.4 pg (ref 26.0–34.0)
MCHC: 31.9 g/dL (ref 30.0–36.0)
MCV: 92.1 fL (ref 80.0–100.0)
Platelets: 263 10*3/uL (ref 150–400)
RBC: 3.78 MIL/uL — ABNORMAL LOW (ref 3.87–5.11)
RDW: 13 % (ref 11.5–15.5)
WBC: 6.3 10*3/uL (ref 4.0–10.5)
nRBC: 0 % (ref 0.0–0.2)

## 2023-01-12 LAB — GLUCOSE, CAPILLARY
Glucose-Capillary: 164 mg/dL — ABNORMAL HIGH (ref 70–99)
Glucose-Capillary: 167 mg/dL — ABNORMAL HIGH (ref 70–99)
Glucose-Capillary: 184 mg/dL — ABNORMAL HIGH (ref 70–99)
Glucose-Capillary: 203 mg/dL — ABNORMAL HIGH (ref 70–99)
Glucose-Capillary: 214 mg/dL — ABNORMAL HIGH (ref 70–99)

## 2023-01-12 LAB — ECHOCARDIOGRAM COMPLETE
Area-P 1/2: 2.73 cm2
Height: 66 in
MV M vel: 4.31 m/s
MV Peak grad: 74.3 mmHg
MV VTI: 1.55 cm2
S' Lateral: 3.9 cm
Weight: 6772.53 oz

## 2023-01-12 LAB — BASIC METABOLIC PANEL
Anion gap: 5 (ref 5–15)
BUN: 27 mg/dL — ABNORMAL HIGH (ref 6–20)
CO2: 23 mmol/L (ref 22–32)
Calcium: 8.3 mg/dL — ABNORMAL LOW (ref 8.9–10.3)
Chloride: 106 mmol/L (ref 98–111)
Creatinine, Ser: 1.47 mg/dL — ABNORMAL HIGH (ref 0.44–1.00)
GFR, Estimated: 45 mL/min — ABNORMAL LOW (ref >60)
Glucose, Bld: 215 mg/dL — ABNORMAL HIGH (ref 70–99)
Potassium: 5 mmol/L (ref 3.5–5.1)
Sodium: 134 mmol/L — ABNORMAL LOW (ref 135–145)

## 2023-01-12 LAB — MAGNESIUM: Magnesium: 1.8 mg/dL (ref 1.7–2.4)

## 2023-01-12 LAB — HEMOGLOBIN A1C
Hgb A1c MFr Bld: 10.7 % — ABNORMAL HIGH (ref 4.8–5.6)
Mean Plasma Glucose: 260.39 mg/dL

## 2023-01-12 LAB — TROPONIN I (HIGH SENSITIVITY)
Troponin I (High Sensitivity): 13 ng/L (ref <18)
Troponin I (High Sensitivity): 15 ng/L (ref <18)

## 2023-01-12 LAB — BRAIN NATRIURETIC PEPTIDE: B Natriuretic Peptide: 227.9 pg/mL — ABNORMAL HIGH (ref 0.0–100.0)

## 2023-01-12 MED ORDER — HYDRALAZINE HCL 25 MG PO TABS: 25.0000 mg | ORAL_TABLET | Freq: Four times a day (QID) | ORAL | Status: DC | PRN

## 2023-01-12 MED ORDER — PERFLUTREN LIPID MICROSPHERE
1.0000 mL | INTRAVENOUS | Status: AC | PRN
  Administered 2023-01-12: 2 mL via INTRAVENOUS

## 2023-01-12 MED ORDER — INSULIN GLARGINE-YFGN 100 UNIT/ML ~~LOC~~ SOLN
40.0000 [IU] | Freq: Every day | SUBCUTANEOUS | Status: DC
  Administered 2023-01-12: 40 [IU] via SUBCUTANEOUS
  Filled 2023-01-12 (×2): qty 0.4

## 2023-01-12 MED ORDER — HYDRALAZINE HCL 20 MG/ML IJ SOLN: 10.0000 mg | INTRAMUSCULAR | Status: DC | PRN

## 2023-01-12 NOTE — Progress Notes (Signed)
PROGRESS NOTE    Karen Bennett  ZOX:[id number] DOB: 1976-04-04 DOA: 01/11/2023 PCP: [name], NP    Brief Narrative:   Karen Bennett is a 47 y.o. female with past medical history significant for IDDM, HTN, CKD stage IIIa, HLD, hypothyroidism, prior PE on Xarelto, s/p right BKA, history of endocarditis, depression/anxiety who presented to the ED for evaluation of shortness of breath. Patient states she woke up this morning feeling short of breath.  She has noticed that her dyspnea is worse when she lays flat.  She has been experiencing some midsternal chest discomfort described as a pulling sensation without radiation.  She has not had any diaphoresis, nausea, fevers, chills, or cough.  She has noticed increasing swelling to her left leg over the last week.  She reports good urine output.  She reports adherence to Xarelto.  She denies any similar chest discomfort in the past.   In the ED BP 177/101, pulse 98, RR 18, temp 98.5 F, SpO2 92% on room air. Sodium 134, potassium 4.8, bicarb 22, BUN 25, creatinine 1.37, serum glucose 259, WBC 6.7, hemoglobin 12.5, platelets 282,000, BNP 241.8. Chest x-ray showed cardiomegaly with layering right pleural effusion and superimposed airspace opacity. Patient was given IV Lasix 40 mg.  The hospitalist service was consulted to admit for further evaluation and management.  Assessment & Plan:   Acute on chronic HFpEF with right pleural effusion: Presenting with PND, orthopnea, increased peripheral edema from baseline although exam limited due to large body habitus.  CXR with right pleural effusion.  BNP 241.8.  Lower suspicion for pneumonia given no leukocytosis and patient is afebrile.  Last TTE 02/2022 showed EF 55%, G1DD.  She is reporting midsternal chest discomfort; troponin within normal limits. -- TTE: Pending -- Furosemide 60 mg IV every 12 hours -- Strict I/O's and daily weights  Asymptomatic bradycardia with sinus pauses Noted 2.4-second  sinus pause with bradycardia.  On metoprolol 12.5 mg p.o. twice daily at baseline. -- Discontinue metoprolol -- Continue monitor on telemetry   Insulin-dependent type 2 diabetes; with hyperglycemia Hemoglobin A1c 10.7 on 01/12/2023, poorly controlled.  Home regimen includes Semglee 50 units subcutaneously daily, Humalog sliding scale. -- Semglee 40 units Brinson qHS -- SSI for coverage -- CBGs qAC/HS   CKD stage IIIa: Baseline creatinine 1.3-1.4; stable -- Monitor renal function daily on IV diuresis   Hypertension: -- Discontinue metoprolol given bradycardia, sinus pauses -- Hydralazine 25 mg p.o. every 6 hours as needed SBP greater than 165   History of PE: -- Continue Xarelto.   Hypothyroidism: -- Continue Synthroid.   Hyperlipidemia: -- Continue atorvastatin 40 mg p.o. daily   Depression/anxiety: -- Continue Wellbutrin and trazodone.   Morbid obesity: Body mass index is 67.79 kg/m.  Discussed with patient needs for aggressive lifestyle changes/weight loss as this complicates all facets of care.  Outpatient follow-up with PCP.  May benefit from bariatric evaluation outpatient.   DVT prophylaxis:  rivaroxaban (XARELTO) tablet 20 mg    Code Status: Full Code Family Communication: No family present at bedside this morning  Disposition Plan:  Level of care: Telemetry Status is: Inpatient Remains inpatient appropriate because: IV diuresis    Consultants:  None  Procedures:  None  Antimicrobials:  None   Subjective: Patient seen examined bedside, resting,.  Lying in bed.  RN present at bedside.  Patient remains bradycardic, intermittent sinus pauses up to 2.4 seconds.  Discontinued metoprolol.  Patient asymptomatic.  Remains on IV diuresis, reports shortness of breath  slightly improved since initial presentation.  No other specific complaints or concerns at this time.  Denies headache, no dizziness, no chest pain, no palpitations, no abdominal pain, no  fever/chills/night sweats, no nausea cefonicid diarrhea, no focal weakness, no fatigue, no paresthesias.  No acute events overnight per nursing staff.  Objective: Vitals:   01/12/23 0500 01/12/23 0541 01/12/23 0731 01/12/23 0917  BP:  137/63 (!) 143/65 132/61  Pulse:  (!) 55 (!) 59 (!) 58  Resp:  18 20   Temp:  98.8 F (37.1 C) 97.7 F (36.5 C)   TempSrc:   Oral   SpO2:  93% 94%   Weight: (!) 192 kg     Height:        Intake/Output Summary (Last 24 hours) at 01/12/2023 1128 Last data filed at 01/12/2023 1058 Gross per 24 hour  Intake 120 ml  Output --  Net 120 ml   Filed Weights   01/11/23 1511 01/11/23 2305 01/12/23 0500  Weight: (!) 190.5 kg (!) 192 kg (!) 192 kg    Examination:  Physical Exam: GEN: NAD, alert and oriented x 3, morbidly obese, chronically ill appearance, appears older than stated age HEENT: NCAT, PERRL, EOMI, sclera clear, MMM PULM: CTAB w/o wheezes/crackles, normal respiratory effort, on room air CV: RRR w/o M/G/R GI: abd soft, NTND, NABS, no R/G/M MSK: Nonpitting peripheral edema bilateral lower extremities, noted right BKA, moves all extremities independently NEURO: CN II-XII intact, no focal deficits, sensation to light touch intact PSYCH: normal mood/affect Integumentary: No concerning rashes/lesions/wounds noted on exposed skin surfaces.    Data Reviewed: I have personally reviewed following labs and imaging studies  CBC: Recent Labs  Lab 01/11/23 1514 01/12/23 0130  WBC 6.7 6.3  HGB 12.5 11.1*  HCT 39.2 34.8*  MCV 92.2 92.1  PLT 282 263   Basic Metabolic Panel: Recent Labs  Lab 01/11/23 1514 01/12/23 0130  NA 134* 134*  K 4.8 5.0  CL 107 106  CO2 22 23  GLUCOSE 259* 215*  BUN 25* 27*  CREATININE 1.37* 1.47*  CALCIUM 8.5* 8.3*  MG  --  1.8   GFR: Estimated Creatinine Clearance: 85.8 mL/min (A) (by C-G formula based on SCr of 1.47 mg/dL (H)). Liver Function Tests: No results for input(s): "AST", "ALT", "ALKPHOS",  "BILITOT", "PROT", "ALBUMIN" in the last 168 hours. No results for input(s): "LIPASE", "AMYLASE" in the last 168 hours. No results for input(s): "AMMONIA" in the last 168 hours. Coagulation Profile: No results for input(s): "INR", "PROTIME" in the last 168 hours. Cardiac Enzymes: No results for input(s): "CKTOTAL", "CKMB", "CKMBINDEX", "TROPONINI" in the last 168 hours. BNP (last 3 results) No results for input(s): "PROBNP" in the last 8760 hours. HbA1C: Recent Labs    01/12/23 0130  HGBA1C 10.7*   CBG: Recent Labs  Lab 01/11/23 2202 01/12/23 0001 01/12/23 0726  GLUCAP 235* 214* 184*   Lipid Profile: No results for input(s): "CHOL", "HDL", "LDLCALC", "TRIG", "CHOLHDL", "LDLDIRECT" in the last 72 hours. Thyroid Function Tests: No results for input(s): "TSH", "T4TOTAL", "FREET4", "T3FREE", "THYROIDAB" in the last 72 hours. Anemia Panel: No results for input(s): "VITAMINB12", "FOLATE", "FERRITIN", "TIBC", "IRON", "RETICCTPCT" in the last 72 hours. Sepsis Labs: No results for input(s): "PROCALCITON", "LATICACIDVEN" in the last 168 hours.  No results found for this or any previous visit (from the past 240 hour(s)).       Radiology Studies: DG Chest 2 View  Result Date: 01/11/2023 CLINICAL DATA:  COPD EXAM: CHEST - 2 VIEW  COMPARISON:  CXR 10/24/21 FINDINGS: Cardiomegaly. Hazy opacity at the right lung base could represent a combination of a layering pleural effusion and atelectasis or infection. No radiographically apparent displaced fractures. Visualized upper abdomen is unremarkable. Vertebral body heights are maintained. IMPRESSION: Cardiomegaly with a layering right pleural effusion and a superimposed airspace opacity that could represent atelectasis or infection. Electronically Signed   By: Lorenza Cambridge M.D.   On: 01/11/2023 15:41        Scheduled Meds:  atorvastatin  40 mg Oral q1800   buPROPion ER  100 mg Oral q morning   furosemide  60 mg Intravenous BID   insulin  aspart  0-15 Units Subcutaneous TID WC   insulin aspart  0-5 Units Subcutaneous QHS   insulin glargine-yfgn  30 Units Subcutaneous QHS   levothyroxine  50 mcg Oral Q0600   potassium chloride  10 mEq Oral Daily   rivaroxaban  20 mg Oral Daily   sodium chloride flush  3 mL Intravenous Q12H   traZODone  150 mg Oral QHS   Continuous Infusions:   LOS: 1 day    Time spent: 52 minutes spent on chart review, discussion with nursing staff, consultants, updating family and interview/physical exam; more than 50% of that time was spent in counseling and/or coordination of care.    Alvira Philips [place], DO [facility] Available via Epic secure chat 7am-7pm After these hours, please refer to coverage provider listed on [email or web address] 01/12/2023, 11:28 AM

## 2023-01-12 NOTE — Progress Notes (Signed)
  Echocardiogram 2D Echocardiogram has been performed.  Karen Bennett 01/12/2023, 3:04 PM

## 2023-01-12 NOTE — TOC Initial Note (Addendum)
Transition of Care [facility]) - Initial/Assessment Note    Patient Details  Name: Karen Bennett MRN: [id number] Date of Birth: April 09, 1976  Transition of Care [facility]) CM/SW Contact:    Adrian Prows, RN Phone Number: 01/12/2023, 3:44 PM  Clinical Narrative:                 [facility] consult for d/c planning; spoke w/ pt via phone; pt says she is from home and plans to return at d/c; she uses [employer] for transportation; she identified POC Jarold Song (friend) [phone]; pt says she does not have glasses, dentures, or HA; she has power wheelchair, BSC/shower chair combo, and HH aide w/ [facility]; pt says she does not have home oxygen; pt denies food/housing insecurity, IPV, or problems paying utilities; TOC will follow.  Expected Discharge Plan: Home w Home Health Services Barriers to Discharge: Continued Medical Work up   Patient Goals and CMS Choice Patient states their goals for this hospitalization and ongoing recovery are:: home          Expected Discharge Plan and Services   Discharge Planning Services: CM Consult Post Acute Care Choice: Resumption of Svcs/PTA Provider, Durable Medical Equipment (power wheelchair, BSC/shower chair combo; HH aide w/ [facility]) Living arrangements for the past 2 months: Apartment                                      Prior Living Arrangements/Services Living arrangements for the past 2 months: Apartment Lives with:: Self Patient language and need for interpreter reviewed:: Yes        Need for Family Participation in Patient Care: Yes (Comment) Care giver support system in place?: Yes (comment) Current home services: DME, Homehealth aide (power wheelchair, BSC/shower chair combo; HH aide w/ [facility]) Criminal Activity/Legal Involvement Pertinent to Current Situation/Hospitalization: No - Comment as needed  Activities of Daily Living Home Assistive Devices/Equipment:  Wheelchair ADL Screening (condition at time of admission) Patient's cognitive ability adequate to safely complete daily activities?: Yes Is the patient deaf or have difficulty hearing?: No Does the patient have difficulty seeing, even when wearing glasses/contacts?: No Does the patient have difficulty concentrating, remembering, or making decisions?: No Patient able to express need for assistance with ADLs?: Yes Does the patient have difficulty dressing or bathing?: No Independently performs ADLs?: No Communication: Independent Dressing (OT): Independent Grooming: Independent Feeding: Independent Bathing: Independent Toileting: Independent In/Out Bed: Independent Walks in Home: Needs assistance, Independent with device (comment) Is this a change from baseline?: Pre-admission baseline Does the patient have difficulty walking or climbing stairs?: Yes Weakness of Legs: Right Weakness of Arms/Hands: None  Permission Sought/Granted Permission sought to share information with : Case Manager Permission granted to share information with : Yes, Verbal Permission Granted  Share Information with NAME: Case Manager     Permission granted to share info w Relationship: Jarold Song (friend) [phone]     Emotional Assessment Appearance:: Other (Comment Required (unable to assess) Attitude/Demeanor/Rapport: Gracious Affect (typically observed): Accepting Orientation: : Oriented to Self, Oriented to Place, Oriented to  Time, Oriented to Situation Alcohol / Substance Use: Not Applicable Psych Involvement: No (comment)  Admission diagnosis:  Acute on chronic congestive heart failure, unspecified heart failure type (HCC) [I50.9] Acute on chronic heart failure with preserved ejection fraction (HFpEF) (HCC) [I50.33] Patient Active Problem List   Diagnosis Date Noted  Acute on chronic heart failure with preserved ejection fraction (HFpEF) (HCC) 01/11/2023   Chronic kidney disease, stage 3a  (HCC) 01/11/2023   Acute pain of right lower extremity 07/04/2022   History of femur fracture 02/23/2022   Bacteremia 02/22/2022   Hypothyroidism    Hyperglycemia 10/24/2021   Hx of bacterial endocarditis 10/24/2021   Pyuria 10/24/2021   Candida rash of groin 09/11/2021   Pressure injury of back, stage 2 (HCC) 09/05/2021   Obstruction of right ureteropelvic junction (UPJ) due to stone 09/04/2021   Sepsis secondary to UTI (HCC) 09/03/2021   Diabetic hyperosmolar non-ketotic state (HCC) 09/03/2021   Stenosis of subclavian artery (HCC) 02/09/2021   Sepsis (HCC) 02/01/2021   Mixed hyperlipidemia 01/12/2021   Infective endocarditis    Pressure injury of skin 12/28/2020   BMI 60.0-69.9, adult (HCC) 12/27/2020   Hypertension associated with diabetes (HCC) 12/27/2020   History of pulmonary embolism 12/27/2020   Necrotizing fasciitis of lower leg (HCC) Thigh  12/27/2020   Cellulitis 12/26/2020   History of DVT (deep vein thrombosis) 11/12/2020   Tobacco abuse 12/31/2017   Hx of right BKA (HCC) 12/31/2017   Closed fracture of right distal femur (HCC) 12/31/2017   Hirsutism 06/21/2016   Noncompliance with treatment plan 04/19/2015   Anxiety and depression 06/26/2013   Normocytic anemia 05/22/2013   Depression 05/22/2013   Grief reaction 05/22/2013   AKI (acute kidney injury) (HCC) 05/07/2013   Phantom limb pain (HCC) 05/06/2013   Hyperlipidemia associated with type 2 diabetes mellitus (HCC) 05/02/2013   Diabetic peripheral neuropathy associated with type 2 diabetes mellitus (HCC) 04/30/2013    Class: Chronic   MSSA (methicillin susceptible Staphylococcus aureus) 09/06/2012   Foot osteomyelitis, left (HCC) 09/03/2012   Pulmonary emboli (HCC) 11/11/2011   Hyponatremia 09/05/2011   Diabetic foot ulcer (HCC) 09/05/2011   Obesity, Class III, BMI 40-49.9 (morbid obesity) (HCC) 08/15/2011   Diabetes mellitus type 2, insulin dependent (HCC) 06/12/2007   PCP:  [name],  NP Pharmacy:   [id number] - [place], [place] - [address] AT [facility] [address] [place] [address] Phone: [phone] Fax: [phone]  [place] [facility] [address] [place] [address] Phone: [phone] Fax: [phone]     Social Determinants of Health (SDOH) Social History: SDOH Screenings   Food Insecurity: No Food Insecurity (01/12/2023)  Housing: Patient Declined (01/12/2023)  Transportation Needs: No Transportation Needs (01/12/2023)  Utilities: Not At Risk (01/12/2023)  Tobacco Use: Low Risk  (01/11/2023)   SDOH Interventions: Food Insecurity Interventions: Inpatient TOC, Intervention Not Indicated Housing Interventions: Intervention Not Indicated, Inpatient TOC Transportation Interventions: Intervention Not Indicated, Inpatient TOC Utilities Interventions: Intervention Not Indicated, Inpatient TOC   Readmission Risk Interventions    02/23/2022   10:38 AM 09/12/2021    1:15 PM 09/07/2021    3:18 PM  Readmission Risk Prevention Plan  Transportation Screening Complete  Complete  HRI or Home Care Consult Complete    Social Work Consult for Recovery Care Planning/Counseling Complete    Palliative Care Screening Not Applicable    Medication Review Oceanographer) Complete Complete   HRI or Home Care Consult   Complete  SW Recovery Care/Counseling Consult   Complete  Palliative Care Screening   Not Applicable  Skilled Nursing Facility  Not Applicable

## 2023-01-13 DIAGNOSIS — I5033 Acute on chronic diastolic (congestive) heart failure: Secondary | ICD-10-CM | POA: Diagnosis not present

## 2023-01-13 LAB — GLUCOSE, CAPILLARY
Glucose-Capillary: 183 mg/dL — ABNORMAL HIGH (ref 70–99)
Glucose-Capillary: 164 mg/dL — ABNORMAL HIGH (ref 70–99)
Glucose-Capillary: 130 mg/dL — ABNORMAL HIGH (ref 70–99)
Glucose-Capillary: 148 mg/dL — ABNORMAL HIGH (ref 70–99)

## 2023-01-13 LAB — BASIC METABOLIC PANEL
Anion gap: 6 (ref 5–15)
BUN: 33 mg/dL — ABNORMAL HIGH (ref 6–20)
CO2: 17 mmol/L — ABNORMAL LOW (ref 22–32)
Calcium: 8.3 mg/dL — ABNORMAL LOW (ref 8.9–10.3)
Chloride: 107 mmol/L (ref 98–111)
Creatinine, Ser: 1.59 mg/dL — ABNORMAL HIGH (ref 0.44–1.00)
GFR, Estimated: 41 mL/min — ABNORMAL LOW (ref >60)
Glucose, Bld: 202 mg/dL — ABNORMAL HIGH (ref 70–99)
Potassium: 4.9 mmol/L (ref 3.5–5.1)
Sodium: 130 mmol/L — ABNORMAL LOW (ref 135–145)

## 2023-01-13 LAB — MAGNESIUM: Magnesium: 1.9 mg/dL (ref 1.7–2.4)

## 2023-01-13 MED ORDER — FUROSEMIDE 10 MG/ML IJ SOLN
40.0000 mg | Freq: Two times a day (BID) | INTRAMUSCULAR | Status: DC
  Administered 2023-01-13 (×2): 40 mg via INTRAVENOUS
  Filled 2023-01-13 (×2): qty 4

## 2023-01-13 MED ORDER — INSULIN GLARGINE-YFGN 100 UNIT/ML ~~LOC~~ SOLN
45.0000 [IU] | Freq: Every day | SUBCUTANEOUS | Status: DC
  Administered 2023-01-13 – 2023-01-14 (×2): 45 [IU] via SUBCUTANEOUS
  Filled 2023-01-13 (×4): qty 0.45

## 2023-01-13 NOTE — Evaluation (Signed)
Occupational Therapy Evaluation Patient Details Name: Karen Bennett MRN: [id number] DOB: [birth year] Today's Date: 01/13/2023   History of Present Illness Patient is a 47 year old female who presented on 6/20 with shortness of breath. Patient was admitted with acute on chronic HFpEF with right pleural effusion. CXR showed: Cardiomegaly with a layering right pleural effusion and a  superimposed airspace opacity that could represent atelectasis or infection.PMH significant for DM, Obesity, HTN, Rt BKA.   Clinical Impression   Patient is a 47 year old female who was admitted for above. Patient was living at home alone with caregiver that comes weekdays from 3-6 hours as needed. Patient was able to advance to EOB with MI with increased time. Patient declined to attempt to transfer at this time with pain/pressure in LLE. Anticipate that with improvement in medical stability patient will no need skilled OT services in next level of care. Patient would continue to benefit from skilled OT services at this time while admitted  to address noted deficits in order to improve overall safety and independence in ADLs.        Recommendations for follow up therapy are one component of a multi-disciplinary discharge planning process, led by the attending physician.  Recommendations may be updated based on patient status, additional functional criteria and insurance authorization.   Assistance Recommended at Discharge Intermittent Supervision/Assistance  Patient can return home with the following A lot of help with walking and/or transfers;A little help with bathing/dressing/bathroom;Assistance with cooking/housework;Assist for transportation;Help with stairs or ramp for entrance    Functional Status Assessment  Patient has had a recent decline in their functional status and demonstrates the ability to make significant improvements in function in a reasonable and predictable amount of time.  Equipment  Recommendations  None recommended by OT       Precautions / Restrictions Precautions Precautions: Fall Precaution Comments: R BKA Restrictions Weight Bearing Restrictions: No      Mobility Bed Mobility Overal bed mobility: Modified Independent             General bed mobility comments: Increased time             ADL either performed or assessed with clinical judgement   ADL Overall ADL's : Needs assistance/impaired Eating/Feeding: Modified independent   Grooming: Modified independent;Sitting Grooming Details (indicate cue type and reason): EOB Upper Body Bathing: Modified independent;Sitting Upper Body Bathing Details (indicate cue type and reason): EOB Lower Body Bathing: Min guard;Supervison/ safety;Sitting/lateral leans Lower Body Bathing Details (indicate cue type and reason): long sitting in bed Upper Body Dressing : Modified independent;Sitting   Lower Body Dressing: Supervision/safety;Min guard;Bed level     Toilet Transfer Details (indicate cue type and reason): declined with increased pressure in foot with dependent positioning Toileting- Clothing Manipulation and Hygiene: Bed level;Min guard;Minimal assistance               Vision Patient Visual Report: No change from baseline              Pertinent Vitals/Pain Pain Assessment Pain Assessment: Faces Faces Pain Scale: Hurts a little bit Pain Location: chest Pain Descriptors / Indicators: Other (Comment) (tightness) Pain Intervention(s): Monitored during session     Hand Dominance Right   Extremity/Trunk Assessment Upper Extremity Assessment Upper Extremity Assessment: Overall WFL for tasks assessed   Lower Extremity Assessment Lower Extremity Assessment: Overall WFL for tasks assessed   Cervical / Trunk Assessment Cervical / Trunk Assessment: Normal   Communication Communication Communication: No difficulties  Cognition Arousal/Alertness: Awake/alert Behavior During  Therapy: WFL for tasks assessed/performed Overall Cognitive Status: Within Functional Limits for tasks assessed                      Home Living Family/patient expects to be discharged to:: Private residence Living Arrangements: Alone (HH aide 5xweek for 3-7hours) Available Help at Discharge: Personal care attendant (weekdays 3-6 hours depending) Type of Home: Apartment Home Access: Level entry     Home Layout: One level     Bathroom Shower/Tub: Walk-in shower (rollin)   Firefighter: Standard     Home Equipment: Grab bars - tub/shower;Grab bars - toilet;Tub bench;Adaptive equipment;Wheelchair - power;Hand held Clinical biochemist: Reacher        Prior Functioning/Environment Prior Level of Function : Independent/Modified Independent             Mobility Comments: power chair for mobility ADLs Comments: aid helps with cooking and cleaning tasks IADLs, otherwise IND with ADLs        OT Problem List: Decreased activity tolerance;Decreased safety awareness;Obesity;Increased edema      OT Treatment/Interventions: Self-care/ADL training;Therapeutic activities;Patient/family education    OT Goals(Current goals can be found in the care plan section) Acute Rehab OT Goals Patient Stated Goal: to get better OT Goal Formulation: With patient Time For Goal Achievement: 01/27/23 Potential to Achieve Goals: Fair  OT Frequency: Min 1X/week       AM-PAC OT "6 Clicks" Daily Activity     Outcome Measure Help from another person eating meals?: None Help from another person taking care of personal grooming?: None Help from another person toileting, which includes using toliet, bedpan, or urinal?: A Lot Help from another person bathing (including washing, rinsing, drying)?: A Lot Help from another person to put on and taking off regular upper body clothing?: None Help from another person to put on and taking off regular lower body clothing?: A Lot 6 Click  Score: 18   End of Session Nurse Communication: Mobility status  Activity Tolerance: Patient tolerated treatment well Patient left: in bed;with call bell/phone within reach  OT Visit Diagnosis: Muscle weakness (generalized) (M62.81);Other abnormalities of gait and mobility (R26.89)                Time: 1610-9604 OT Time Calculation (min): 27 min Charges:  OT General Charges $OT Visit: 1 Visit OT Evaluation $OT Eval Low Complexity: 1 Low OT Treatments $Self Care/Home Management : 8-22 mins  Rosalio Loud, MS Acute Rehabilitation Department Office# [phone]   Selinda Flavin 01/13/2023, 12:24 PM

## 2023-01-13 NOTE — Evaluation (Signed)
Physical Therapy Evaluation Patient Details Name: Karen Bennett MRN: [id number] DOB: 08-21-1975 Today's Date: 01/13/2023  History of Present Illness  Patient is a 47 year old female who presented on 6/20 with shortness of breath. Patient was admitted with acute on chronic HFpEF with right pleural effusion. CXR showed: Cardiomegaly with a layering right pleural effusion and a  superimposed airspace opacity that could represent atelectasis or infection.PMH significant for DM, Obesity, HTN, Rt BKA.   Clinical Impression  Pt presents with problems listed above and functional impairments below, received supine in bed with complaints of mild chest pain 2/10. Pt demonstrated modified independence with bed mobility on flat bed with use of bed rails and increased time, no physical assist required. Pt sat EOB for extremity strength and sensation testing, reporting only chest pain and feeling of "tightness" in legs secondary to swelling. Pt with no other complaints. Declined transferring at this time. Pt reporting 7/10 chest pain at end of session, HR80, RR20, communicated to nursing at end of session. We will continue to follow acutely.       Recommendations for follow up therapy are one component of a multi-disciplinary discharge planning process, led by the attending physician.  Recommendations may be updated based on patient status, additional functional criteria and insurance authorization.  Follow Up Recommendations       Assistance Recommended at Discharge Set up Supervision/Assistance  Patient can return home with the following  Assist for transportation;Assistance with cooking/housework    Equipment Recommendations None recommended by PT  Recommendations for Other Services       Functional Status Assessment Patient has had a recent decline in their functional status and demonstrates the ability to make significant improvements in function in a reasonable and predictable amount of time.      Precautions / Restrictions Precautions Precautions: Fall Precaution Comments: R BKA Restrictions Weight Bearing Restrictions: No      Mobility  Bed Mobility Overal bed mobility: Modified Independent             General bed mobility comments: Increased time    Transfers                   General transfer comment: Pt reporting she performs AP transfer bed<>power chair but declining to transfer at this time citing chest pain and fatigue.    Ambulation/Gait               General Gait Details: nonambulatory at baseline  Stairs            Wheelchair Mobility    Modified Rankin (Stroke Patients Only)       Balance                                             Pertinent Vitals/Pain Pain Assessment Faces Pain Scale: Hurts a little bit Pain Location: chest Pain Descriptors / Indicators: Other (Comment) (tightness) Pain Intervention(s): Monitored during session, Patient requesting pain meds-RN notified    Home Living Family/patient expects to be discharged to:: Private residence Living Arrangements: Alone (HH aide 5xweek for 3-7hours) Available Help at Discharge: Personal care attendant (weekdays 3-6 hours depending) Type of Home: Apartment Home Access: Level entry       Home Layout: One level Home Equipment: Grab bars - tub/shower;Grab bars - toilet;Tub bench;Adaptive equipment;Wheelchair - power;Hand held shower head  Prior Function Prior Level of Function : Independent/Modified Independent             Mobility Comments: power chair for mobility ADLs Comments: aid helps with cooking and cleaning tasks IADLs, otherwise IND with ADLs     Hand Dominance   Dominant Hand: Right    Extremity/Trunk Assessment   Upper Extremity Assessment Upper Extremity Assessment: Overall WFL for tasks assessed    Lower Extremity Assessment Lower Extremity Assessment: Overall WFL for tasks assessed    Cervical /  Trunk Assessment Cervical / Trunk Assessment: Normal  Communication   Communication: No difficulties  Cognition Arousal/Alertness: Awake/alert Behavior During Therapy: WFL for tasks assessed/performed Overall Cognitive Status: Within Functional Limits for tasks assessed                                          General Comments      Exercises     Assessment/Plan    PT Assessment Patient needs continued PT services  PT Problem List Decreased strength;Decreased range of motion;Decreased activity tolerance;Decreased balance;Decreased mobility;Decreased coordination;Pain       PT Treatment Interventions DME instruction;Gait training;Stair training;Functional mobility training;Therapeutic activities;Therapeutic exercise;Neuromuscular re-education;Patient/family education;Balance training;Wheelchair mobility training    PT Goals (Current goals can be found in the Care Plan section)  Acute Rehab PT Goals Patient Stated Goal: gethome PT Goal Formulation: With patient Time For Goal Achievement: 01/27/23 Potential to Achieve Goals: Good    Frequency Min 1X/week     Co-evaluation               AM-PAC PT "6 Clicks" Mobility  Outcome Measure Help needed turning from your back to your side while in a flat bed without using bedrails?: None Help needed moving from lying on your back to sitting on the side of a flat bed without using bedrails?: None Help needed moving to and from a bed to a chair (including a wheelchair)?: Total Help needed standing up from a chair using your arms (e.g., wheelchair or bedside chair)?: Total Help needed to walk in hospital room?: Total Help needed climbing 3-5 steps with a railing? : Total 6 Click Score: 12    End of Session   Activity Tolerance: Patient limited by pain;Patient limited by fatigue Patient left: in bed;with call bell/phone within reach Nurse Communication: Mobility status;Patient requests pain meds PT Visit  Diagnosis: Difficulty in walking, not elsewhere classified (R26.2)    Time: 2536-6440 PT Time Calculation (min) (ACUTE ONLY): 13 min   Charges:   PT Evaluation $PT Eval Low Complexity: 1 Low          Jamesetta Geralds, PT, DPT [facility] Rehabilitation Department Office: [phone]  Jamesetta Geralds 01/13/2023, 10:14 AM

## 2023-01-13 NOTE — Progress Notes (Signed)
PROGRESS NOTE    Karen Bennett  QMV:[id number] DOB: Nov 17, 1975 DOA: 01/11/2023 PCP: [name], NP    Brief Narrative:   Karen Bennett is a 47 y.o. female with past medical history significant for IDDM, HTN, CKD stage IIIa, HLD, hypothyroidism, prior PE on Xarelto, s/p right BKA, history of endocarditis, depression/anxiety who presented to the ED for evaluation of shortness of breath. Patient states she woke up this morning feeling short of breath.  She has noticed that her dyspnea is worse when she lays flat.  She has been experiencing some midsternal chest discomfort described as a pulling sensation without radiation.  She has not had any diaphoresis, nausea, fevers, chills, or cough.  She has noticed increasing swelling to her left leg over the last week.  She reports good urine output.  She reports adherence to Xarelto.  She denies any similar chest discomfort in the past.   In the ED BP 177/101, pulse 98, RR 18, temp 98.5 F, SpO2 92% on room air. Sodium 134, potassium 4.8, bicarb 22, BUN 25, creatinine 1.37, serum glucose 259, WBC 6.7, hemoglobin 12.5, platelets 282,000, BNP 241.8. Chest x-ray showed cardiomegaly with layering right pleural effusion and superimposed airspace opacity. Patient was given IV Lasix 40 mg.  The hospitalist service was consulted to admit for further evaluation and management.  Assessment & Plan:   Acute on chronic HFpEF with right pleural effusion: Presenting with PND, orthopnea, increased peripheral edema from baseline although exam limited due to large body habitus.  CXR with right pleural effusion.  BNP 241.8.  Lower suspicion for pneumonia given no leukocytosis and patient is afebrile.  Last TTE 02/2022 showed EF 55%, G1DD.  She is reporting midsternal chest discomfort; troponin within normal limits.  TTE with LVEF 50-55%, grade 2 diastolic dysfunction, RV mildly enlarged, severely elevated PAP 62.9 mmHg, mild/moderate M RRR, no aortic stenosis, IVC  dilated. -- Furosemide 40 mg IV every 12 hours -- Strict I/O's and daily weights  Asymptomatic bradycardia with sinus pauses Noted 2.4-second sinus pause with bradycardia just following admission.  On metoprolol 12.5 mg p.o. twice daily at baseline. -- Discontinued metoprolol -- Continue monitor on telemetry   Insulin-dependent type 2 diabetes; with hyperglycemia Hemoglobin A1c 10.7 on 01/12/2023, poorly controlled.  Home regimen includes Semglee 50 units subcutaneously daily, Humalog sliding scale. -- Semglee 45 units [place] qHS -- SSI for coverage -- CBGs qAC/HS   CKD stage IIIa: Baseline creatinine 1.3-1.4; stable -- Monitor renal function daily on IV diuresis   Hypertension: -- Discontinue metoprolol given bradycardia, sinus pauses -- Hydralazine 25 mg p.o. every 6 hours as needed SBP greater than 165   History of PE: -- Continue Xarelto.   Hypothyroidism: -- Continue Synthroid.   Hyperlipidemia: -- Continue atorvastatin 40 mg p.o. daily   Depression/anxiety: -- Continue Wellbutrin and trazodone.   Morbid obesity: Body mass index is 67.79 kg/m.  Discussed with patient needs for aggressive lifestyle changes/weight loss as this complicates all facets of care.  Outpatient follow-up with PCP.  May benefit from bariatric evaluation outpatient.   DVT prophylaxis:  rivaroxaban (XARELTO) tablet 20 mg    Code Status: Full Code Family Communication: No family present at bedside this morning  Disposition Plan:  Level of care: Telemetry Status is: Inpatient Remains inpatient appropriate because: IV diuresis    Consultants:  None  Procedures:  None  Antimicrobials:  None   Subjective: Patient seen examined bedside, resting,.  Lying in bed.  Reports breathing improved.  Will  reduce IV furosemide to 40 mg IV twice daily today due to slight increased creatinine.  Discussed findings of TTE and patient requesting information which was given to her this morning.  No other  specific complaints or concerns at this time.  Denies headache, no dizziness, no chest pain, no palpitations, no abdominal pain, no fever/chills/night sweats, no nausea cefonicid diarrhea, no focal weakness, no fatigue, no paresthesias.  No acute events overnight per nursing staff.  Objective: Vitals:   01/12/23 1300 01/12/23 2000 01/13/23 0432 01/13/23 1148  BP: (!) 125/57 (!) 142/61 (!) 133/55 135/63  Pulse: (!) 53 (!) 58 60 61  Resp: 20 19 18 19   Temp:  97.8 F (36.6 C) 97.7 F (36.5 C) 97.7 F (36.5 C)  TempSrc:  Oral Oral Oral  SpO2: 95% 91% 92% 90%  Weight:   (!) 193.2 kg   Height:        Intake/Output Summary (Last 24 hours) at 01/13/2023 1239 Last data filed at 01/13/2023 0900 Gross per 24 hour  Intake 960 ml  Output 1650 ml  Net -690 ml   Filed Weights   01/11/23 2305 01/12/23 0500 01/13/23 0432  Weight: (!) 192 kg (!) 192 kg (!) 193.2 kg    Examination:  Physical Exam: GEN: NAD, alert and oriented x 3, morbidly obese, chronically ill appearance, appears older than stated age HEENT: NCAT, PERRL, EOMI, sclera clear, MMM PULM: CTAB w/o wheezes/crackles, normal respiratory effort, on room air CV: RRR w/o M/G/R GI: abd soft, NTND, NABS, no R/G/M MSK: Nonpitting peripheral edema bilateral lower extremities, noted right BKA, moves all extremities independently NEURO: CN II-XII intact, no focal deficits, sensation to light touch intact PSYCH: normal mood/affect Integumentary: No concerning rashes/lesions/wounds noted on exposed skin surfaces.    Data Reviewed: I have personally reviewed following labs and imaging studies  CBC: Recent Labs  Lab 01/11/23 1514 01/12/23 0130  WBC 6.7 6.3  HGB 12.5 11.1*  HCT 39.2 34.8*  MCV 92.2 92.1  PLT 282 263   Basic Metabolic Panel: Recent Labs  Lab 01/11/23 1514 01/12/23 0130 01/13/23 0526  NA 134* 134* 130*  K 4.8 5.0 4.9  CL 107 106 107  CO2 22 23 17*  GLUCOSE 259* 215* 202*  BUN 25* 27* 33*  CREATININE 1.37*  1.47* 1.59*  CALCIUM 8.5* 8.3* 8.3*  MG  --  1.8 1.9   GFR: Estimated Creatinine Clearance: 79.6 mL/min (A) (by C-G formula based on SCr of 1.59 mg/dL (H)). Liver Function Tests: No results for input(s): "AST", "ALT", "ALKPHOS", "BILITOT", "PROT", "ALBUMIN" in the last 168 hours. No results for input(s): "LIPASE", "AMYLASE" in the last 168 hours. No results for input(s): "AMMONIA" in the last 168 hours. Coagulation Profile: No results for input(s): "INR", "PROTIME" in the last 168 hours. Cardiac Enzymes: No results for input(s): "CKTOTAL", "CKMB", "CKMBINDEX", "TROPONINI" in the last 168 hours. BNP (last 3 results) No results for input(s): "PROBNP" in the last 8760 hours. HbA1C: Recent Labs    01/12/23 0130  HGBA1C 10.7*   CBG: Recent Labs  Lab 01/12/23 1127 01/12/23 1628 01/12/23 2046 01/13/23 0723 01/13/23 1143  GLUCAP 203* 167* 164* 183* 164*   Lipid Profile: No results for input(s): "CHOL", "HDL", "LDLCALC", "TRIG", "CHOLHDL", "LDLDIRECT" in the last 72 hours. Thyroid Function Tests: No results for input(s): "TSH", "T4TOTAL", "FREET4", "T3FREE", "THYROIDAB" in the last 72 hours. Anemia Panel: No results for input(s): "VITAMINB12", "FOLATE", "FERRITIN", "TIBC", "IRON", "RETICCTPCT" in the last 72 hours. Sepsis Labs: No results for  input(s): "PROCALCITON", "LATICACIDVEN" in the last 168 hours.  No results found for this or any previous visit (from the past 240 hour(s)).       Radiology Studies: ECHOCARDIOGRAM COMPLETE  Result Date: 01/12/2023    ECHOCARDIOGRAM REPORT   Patient Name:   Karen Bennett Date of Exam: 01/12/2023 Medical Rec #:  [id number]       Height:       66.0 in Accession #:    [id number]      Weight:       423.3 lb Date of Birth:  [birth year]      BSA:          2.750 m Patient Age:    46 years        BP:           125/57 mmHg Patient Gender: F               HR:           56 bpm. Exam Location:  Inpatient Procedure: 2D Echo, Cardiac Doppler, Color  Doppler and Intracardiac            Opacification Agent Indications:    CHF Acute Diastolic  History:        Patient has prior history of Echocardiogram examinations, most                 recent 02/22/2022. Endocarditis, Signs/Symptoms:Shortness of                 Breath; Risk Factors:Diabetes, Hypertension and Dyslipidemia.                 CKD.  Sonographer:    Milda Smart Referring Phys: [id number] VISHAL R PATEL  Sonographer Comments: Image acquisition challenging due to patient body habitus and Image acquisition challenging due to respiratory motion. Pt scanned supine due to pt unable to turn. IMPRESSIONS  1. Left ventricular ejection fraction, by estimation, is 50 to 55%. The left ventricle has low normal function. The left ventricle has no regional wall motion abnormalities. Left ventricular diastolic parameters are consistent with Grade II diastolic dysfunction (pseudonormalization). There is abnormal septal motion and the interventricular septum is flattened in systole, consistent with right ventricular pressure overload.  2. Right ventricular systolic function is normal. The right ventricular size is mildly enlarged. There is severely elevated pulmonary artery systolic pressure. The estimated right ventricular systolic pressure is 62.9 mmHg.  3. The mitral valve is abnormal. Mild to moderate mitral valve regurgitation. No evidence of mitral stenosis.  4. The aortic valve is normal in structure. Aortic valve regurgitation is not visualized. No aortic stenosis is present.  5. The inferior vena cava is dilated in size with <50% respiratory variability, suggesting right atrial pressure of 15 mmHg. FINDINGS  Left Ventricle: Left ventricular ejection fraction, by estimation, is 50 to 55%. The left ventricle has low normal function. The left ventricle has no regional wall motion abnormalities. Definity contrast agent was given IV to delineate the left ventricular endocardial borders. The left ventricular internal  cavity size was normal in size. There is no left ventricular hypertrophy. Abnormal septal motion and the interventricular septum is flattened in systole, consistent with right ventricular pressure overload. Left ventricular diastolic parameters are consistent with Grade II diastolic dysfunction (pseudonormalization). Right Ventricle: The right ventricular size is mildly enlarged. No increase in right ventricular wall thickness. Right ventricular systolic function is normal. There is severely elevated pulmonary artery systolic pressure. The tricuspid regurgitant velocity  is 3.46 m/s, and with an assumed right atrial pressure of 15 mmHg, the estimated right ventricular systolic pressure is 62.9 mmHg. Left Atrium: Left atrial size was normal in size. Right Atrium: Right atrial size was normal in size. Pericardium: There is no evidence of pericardial effusion. Mitral Valve: The mitral valve is abnormal. There is mild calcification of the mitral valve leaflet(s). Mild to moderate mitral valve regurgitation. No evidence of mitral valve stenosis. MV peak gradient, 11.2 mmHg. The mean mitral valve gradient is 3.0 mmHg. Tricuspid Valve: The tricuspid valve is normal in structure. Tricuspid valve regurgitation is trivial. No evidence of tricuspid stenosis. Aortic Valve: The aortic valve is normal in structure. Aortic valve regurgitation is not visualized. No aortic stenosis is present. Pulmonic Valve: The pulmonic valve was normal in structure. Pulmonic valve regurgitation is mild. No evidence of pulmonic stenosis. Aorta: The aortic root is normal in size and structure. Venous: The inferior vena cava is dilated in size with less than 50% respiratory variability, suggesting right atrial pressure of 15 mmHg. IAS/Shunts: No atrial level shunt detected by color flow Doppler.  LEFT VENTRICLE PLAX 2D LVIDd:         5.30 cm   Diastology LVIDs:         3.90 cm   LV e' medial:    4.80 cm/s LV PW:         0.80 cm   LV E/e' medial:   26.5 LV IVS:        0.80 cm   LV e' lateral:   6.38 cm/s LVOT diam:     2.00 cm   LV E/e' lateral: 19.9 LV SV:         85 LV SV Index:   31 LVOT Area:     3.14 cm  RIGHT VENTRICLE            IVC RV S prime:     9.08 cm/s  IVC diam: 2.40 cm TAPSE (M-mode): 1.8 cm LEFT ATRIUM             Index        RIGHT ATRIUM           Index LA diam:        4.50 cm 1.64 cm/m   RA Area:     14.30 cm LA Vol (A2C):   56.8 ml 20.65 ml/m  RA Volume:   37.40 ml  13.60 ml/m LA Vol (A4C):   48.6 ml 17.67 ml/m LA Biplane Vol: 54.0 ml 19.63 ml/m  AORTIC VALVE LVOT Vmax:   126.00 cm/s LVOT Vmean:  86.400 cm/s LVOT VTI:    0.272 m  AORTA Ao Root diam: 2.80 cm Ao Asc diam:  3.40 cm MITRAL VALVE                TRICUSPID VALVE MV Area (PHT): 2.73 cm     TR Peak grad:   47.9 mmHg MV Area VTI:   1.55 cm     TR Mean grad:   28.0 mmHg MV Peak grad:  11.2 mmHg    TR Vmax:        346.00 cm/s MV Mean grad:  3.0 mmHg     TR Vmean:       253.0 cm/s MV Vmax:       1.67 m/s MV Vmean:      72.8 cm/s    SHUNTS MV Decel Time: 278 msec     Systemic VTI:  0.27 m MR Peak  grad: 74.3 mmHg     Systemic Diam: 2.00 cm MR Mean grad: 51.0 mmHg MR Vmax:      431.00 cm/s MR Vmean:     341.0 cm/s MV E velocity: 127.00 cm/s MV A velocity: 79.60 cm/s MV E/A ratio:  1.60 Weston Brass MD Electronically signed by Weston Brass MD Signature Date/Time: 01/12/2023/5:19:43 PM    Final    DG Chest 2 View  Result Date: 01/11/2023 CLINICAL DATA:  COPD EXAM: CHEST - 2 VIEW COMPARISON:  CXR 10/24/21 FINDINGS: Cardiomegaly. Hazy opacity at the right lung base could represent a combination of a layering pleural effusion and atelectasis or infection. No radiographically apparent displaced fractures. Visualized upper abdomen is unremarkable. Vertebral body heights are maintained. IMPRESSION: Cardiomegaly with a layering right pleural effusion and a superimposed airspace opacity that could represent atelectasis or infection. Electronically Signed   By: Lorenza Cambridge M.D.    On: 01/11/2023 15:41        Scheduled Meds:  atorvastatin  40 mg Oral q1800   buPROPion ER  100 mg Oral q morning   furosemide  40 mg Intravenous BID   insulin aspart  0-15 Units Subcutaneous TID WC   insulin aspart  0-5 Units Subcutaneous QHS   insulin glargine-yfgn  40 Units Subcutaneous QHS   levothyroxine  50 mcg Oral Q0600   potassium chloride  10 mEq Oral Daily   rivaroxaban  20 mg Oral Daily   sodium chloride flush  3 mL Intravenous Q12H   traZODone  150 mg Oral QHS   Continuous Infusions:   LOS: 2 days    Time spent: 52 minutes spent on chart review, discussion with nursing staff, consultants, updating family and interview/physical exam; more than 50% of that time was spent in counseling and/or coordination of care.    Alvira Philips [place], DO [facility] Available via Epic secure chat 7am-7pm After these hours, please refer to coverage provider listed on [email or web address] 01/13/2023, 12:39 PM

## 2023-01-14 DIAGNOSIS — I5033 Acute on chronic diastolic (congestive) heart failure: Secondary | ICD-10-CM | POA: Diagnosis not present

## 2023-01-14 LAB — BASIC METABOLIC PANEL
Anion gap: 9 (ref 5–15)
BUN: 35 mg/dL — ABNORMAL HIGH (ref 6–20)
CO2: 19 mmol/L — ABNORMAL LOW (ref 22–32)
Calcium: 8.2 mg/dL — ABNORMAL LOW (ref 8.9–10.3)
Chloride: 105 mmol/L (ref 98–111)
Creatinine, Ser: 1.8 mg/dL — ABNORMAL HIGH (ref 0.44–1.00)
GFR, Estimated: 35 mL/min — ABNORMAL LOW (ref >60)
Glucose, Bld: 112 mg/dL — ABNORMAL HIGH (ref 70–99)
Potassium: 5 mmol/L (ref 3.5–5.1)
Sodium: 133 mmol/L — ABNORMAL LOW (ref 135–145)

## 2023-01-14 LAB — GLUCOSE, CAPILLARY
Glucose-Capillary: 99 mg/dL (ref 70–99)
Glucose-Capillary: 110 mg/dL — ABNORMAL HIGH (ref 70–99)
Glucose-Capillary: 117 mg/dL — ABNORMAL HIGH (ref 70–99)
Glucose-Capillary: 122 mg/dL — ABNORMAL HIGH (ref 70–99)

## 2023-01-14 MED ORDER — FUROSEMIDE 40 MG PO TABS: 40.0000 mg | ORAL_TABLET | Freq: Every day | ORAL | Status: DC

## 2023-01-14 NOTE — Progress Notes (Signed)
PROGRESS NOTE    Karen Bennett  JXB:[id number] DOB: Dec 27, 1975 DOA: 01/11/2023 PCP: [name], NP    Brief Narrative:   Karen Bennett is a 47 y.o. female with past medical history significant for IDDM, HTN, CKD stage IIIa, HLD, hypothyroidism, prior PE on Xarelto, s/p right BKA, history of endocarditis, depression/anxiety who presented to the ED for evaluation of shortness of breath. Patient states she woke up this morning feeling short of breath.  She has noticed that her dyspnea is worse when she lays flat.  She has been experiencing some midsternal chest discomfort described as a pulling sensation without radiation.  She has not had any diaphoresis, nausea, fevers, chills, or cough.  She has noticed increasing swelling to her left leg over the last week.  She reports good urine output.  She reports adherence to Xarelto.  She denies any similar chest discomfort in the past.   In the ED BP 177/101, pulse 98, RR 18, temp 98.5 F, SpO2 92% on room air. Sodium 134, potassium 4.8, bicarb 22, BUN 25, creatinine 1.37, serum glucose 259, WBC 6.7, hemoglobin 12.5, platelets 282,000, BNP 241.8. Chest x-ray showed cardiomegaly with layering right pleural effusion and superimposed airspace opacity. Patient was given IV Lasix 40 mg.  The hospitalist service was consulted to admit for further evaluation and management.  Assessment & Plan:   Acute on chronic HFpEF with right pleural effusion: Presenting with PND, orthopnea, increased peripheral edema from baseline although exam limited due to large body habitus.  CXR with right pleural effusion.  BNP 241.8.  Lower suspicion for pneumonia given no leukocytosis and patient is afebrile.  Last TTE 02/2022 showed EF 55%, G1DD.  She is reporting midsternal chest discomfort; troponin within normal limits.  TTE with LVEF 50-55%, grade 2 diastolic dysfunction, RV mildly enlarged, severely elevated PAP 62.9 mmHg, mild/moderate M RRR, no aortic stenosis, IVC  dilated. -- Discontinue IV furosemide today, will plan to transition to oral furosemide tomorrow -- Strict I/O's and daily weights -- Repeat BMP in the a.m.  Asymptomatic bradycardia with sinus pauses Noted 2.4-second sinus pause with bradycardia just following admission.  On metoprolol 12.5 mg p.o. twice daily at baseline. -- Discontinued metoprolol -- Continue monitor on telemetry   Insulin-dependent type 2 diabetes; with hyperglycemia Hemoglobin A1c 10.7 on 01/12/2023, poorly controlled.  Home regimen includes Semglee 50 units subcutaneously daily, Humalog sliding scale. -- Semglee 45 units [place] qHS -- SSI for coverage -- CBGs qAC/HS   CKD stage IIIa: Baseline creatinine 1.3-1.4; stable -- Holding IV furosemide today due to bump in creatinine to 1.8, repeat BMP in a.m.   Hypertension: -- Discontinue metoprolol given bradycardia, sinus pauses -- Hydralazine 25 mg p.o. every 6 hours as needed SBP greater than 165   History of PE: -- Continue Xarelto.   Hypothyroidism: -- Continue Synthroid.   Hyperlipidemia: -- Continue atorvastatin 40 mg p.o. daily   Depression/anxiety: -- Continue Wellbutrin and trazodone.   Morbid obesity: Body mass index is 67.79 kg/m.  Discussed with patient needs for aggressive lifestyle changes/weight loss as this complicates all facets of care.  Outpatient follow-up with PCP.  May benefit from bariatric evaluation outpatient.   DVT prophylaxis:  rivaroxaban (XARELTO) tablet 20 mg    Code Status: Full Code Family Communication: No family present at bedside this morning  Disposition Plan:  Level of care: Telemetry Status is: Inpatient Remains inpatient appropriate because: Anticipate likely discharge home tomorrow    Consultants:  None  Procedures:  None  Antimicrobials:  None   Subjective: Patient seen examined bedside, lying in bed.  Watching her iPad.  Reports her dyspnea much improved.  Discussed will hold IV diuresis today given  elevated creatinine to 1.8 and anticipate likely initiation of oral Lasix tomorrow with possible discharge home.  No other specific complaints or concerns at this time.  Denies headache, no dizziness, no chest pain, no palpitations, no abdominal pain, no fever/chills/night sweats, no nausea cefonicid diarrhea, no focal weakness, no fatigue, no paresthesias.  No acute events overnight per nursing staff.  Objective: Vitals:   01/13/23 1148 01/13/23 2045 01/14/23 0517 01/14/23 0817  BP: 135/63 107/65 (!) 148/59   Pulse: 61 61 76   Resp: 19  19   Temp: 97.7 F (36.5 C) 98.2 F (36.8 C) 97.9 F (36.6 C)   TempSrc: Oral Oral Oral   SpO2: 90% 93% 96%   Weight:    (!) 195.1 kg  Height:        Intake/Output Summary (Last 24 hours) at 01/14/2023 1204 Last data filed at 01/14/2023 0900 Gross per 24 hour  Intake 480 ml  Output 250 ml  Net 230 ml   Filed Weights   01/12/23 0500 01/13/23 0432 01/14/23 0817  Weight: (!) 192 kg (!) 193.2 kg (!) 195.1 kg    Examination:  Physical Exam: GEN: NAD, alert and oriented x 3, morbidly obese, chronically ill appearance, appears older than stated age HEENT: NCAT, PERRL, EOMI, sclera clear, MMM PULM: CTAB w/o wheezes/crackles, normal respiratory effort, on room air CV: RRR w/o M/G/R GI: abd soft, NTND, NABS, no R/G/M MSK: Nonpitting peripheral edema bilateral lower extremities, noted right BKA, moves all extremities independently NEURO: CN II-XII intact, no focal deficits, sensation to light touch intact PSYCH: normal mood/affect Integumentary: No concerning rashes/lesions/wounds noted on exposed skin surfaces.    Data Reviewed: I have personally reviewed following labs and imaging studies  CBC: Recent Labs  Lab 01/11/23 1514 01/12/23 0130  WBC 6.7 6.3  HGB 12.5 11.1*  HCT 39.2 34.8*  MCV 92.2 92.1  PLT 282 263   Basic Metabolic Panel: Recent Labs  Lab 01/11/23 1514 01/12/23 0130 01/13/23 0526 01/14/23 0538  NA 134* 134* 130* 133*   K 4.8 5.0 4.9 5.0  CL 107 106 107 105  CO2 22 23 17* 19*  GLUCOSE 259* 215* 202* 112*  BUN 25* 27* 33* 35*  CREATININE 1.37* 1.47* 1.59* 1.80*  CALCIUM 8.5* 8.3* 8.3* 8.2*  MG  --  1.8 1.9  --    GFR: Estimated Creatinine Clearance: 70.8 mL/min (A) (by C-G formula based on SCr of 1.8 mg/dL (H)). Liver Function Tests: No results for input(s): "AST", "ALT", "ALKPHOS", "BILITOT", "PROT", "ALBUMIN" in the last 168 hours. No results for input(s): "LIPASE", "AMYLASE" in the last 168 hours. No results for input(s): "AMMONIA" in the last 168 hours. Coagulation Profile: No results for input(s): "INR", "PROTIME" in the last 168 hours. Cardiac Enzymes: No results for input(s): "CKTOTAL", "CKMB", "CKMBINDEX", "TROPONINI" in the last 168 hours. BNP (last 3 results) No results for input(s): "PROBNP" in the last 8760 hours. HbA1C: Recent Labs    01/12/23 0130  HGBA1C 10.7*   CBG: Recent Labs  Lab 01/13/23 1143 01/13/23 1605 01/13/23 2053 01/14/23 0732 01/14/23 1119  GLUCAP 164* 130* 148* 99 110*   Lipid Profile: No results for input(s): "CHOL", "HDL", "LDLCALC", "TRIG", "CHOLHDL", "LDLDIRECT" in the last 72 hours. Thyroid Function Tests: No results for input(s): "TSH", "T4TOTAL", "FREET4", "T3FREE", "THYROIDAB" in the  last 72 hours. Anemia Panel: No results for input(s): "VITAMINB12", "FOLATE", "FERRITIN", "TIBC", "IRON", "RETICCTPCT" in the last 72 hours. Sepsis Labs: No results for input(s): "PROCALCITON", "LATICACIDVEN" in the last 168 hours.  No results found for this or any previous visit (from the past 240 hour(s)).       Radiology Studies: ECHOCARDIOGRAM COMPLETE  Result Date: 01/12/2023    ECHOCARDIOGRAM REPORT   Patient Name:   LAVRA IMLER Date of Exam: 01/12/2023 Medical Rec #:  [id number]       Height:       66.0 in Accession #:    [id number]      Weight:       423.3 lb Date of Birth:  10/05/1975      BSA:          2.750 m Patient Age:    46 years        BP:            125/57 mmHg Patient Gender: F               HR:           56 bpm. Exam Location:  Inpatient Procedure: 2D Echo, Cardiac Doppler, Color Doppler and Intracardiac            Opacification Agent Indications:    CHF Acute Diastolic  History:        Patient has prior history of Echocardiogram examinations, most                 recent 02/22/2022. Endocarditis, Signs/Symptoms:Shortness of                 Breath; Risk Factors:Diabetes, Hypertension and Dyslipidemia.                 CKD.  Sonographer:    Milda Smart Referring Phys: [id number] VISHAL R PATEL  Sonographer Comments: Image acquisition challenging due to patient body habitus and Image acquisition challenging due to respiratory motion. Pt scanned supine due to pt unable to turn. IMPRESSIONS  1. Left ventricular ejection fraction, by estimation, is 50 to 55%. The left ventricle has low normal function. The left ventricle has no regional wall motion abnormalities. Left ventricular diastolic parameters are consistent with Grade II diastolic dysfunction (pseudonormalization). There is abnormal septal motion and the interventricular septum is flattened in systole, consistent with right ventricular pressure overload.  2. Right ventricular systolic function is normal. The right ventricular size is mildly enlarged. There is severely elevated pulmonary artery systolic pressure. The estimated right ventricular systolic pressure is 62.9 mmHg.  3. The mitral valve is abnormal. Mild to moderate mitral valve regurgitation. No evidence of mitral stenosis.  4. The aortic valve is normal in structure. Aortic valve regurgitation is not visualized. No aortic stenosis is present.  5. The inferior vena cava is dilated in size with <50% respiratory variability, suggesting right atrial pressure of 15 mmHg. FINDINGS  Left Ventricle: Left ventricular ejection fraction, by estimation, is 50 to 55%. The left ventricle has low normal function. The left ventricle has no regional wall  motion abnormalities. Definity contrast agent was given IV to delineate the left ventricular endocardial borders. The left ventricular internal cavity size was normal in size. There is no left ventricular hypertrophy. Abnormal septal motion and the interventricular septum is flattened in systole, consistent with right ventricular pressure overload. Left ventricular diastolic parameters are consistent with Grade II diastolic dysfunction (pseudonormalization). Right Ventricle: The right ventricular size is mildly enlarged.  No increase in right ventricular wall thickness. Right ventricular systolic function is normal. There is severely elevated pulmonary artery systolic pressure. The tricuspid regurgitant velocity is 3.46 m/s, and with an assumed right atrial pressure of 15 mmHg, the estimated right ventricular systolic pressure is 62.9 mmHg. Left Atrium: Left atrial size was normal in size. Right Atrium: Right atrial size was normal in size. Pericardium: There is no evidence of pericardial effusion. Mitral Valve: The mitral valve is abnormal. There is mild calcification of the mitral valve leaflet(s). Mild to moderate mitral valve regurgitation. No evidence of mitral valve stenosis. MV peak gradient, 11.2 mmHg. The mean mitral valve gradient is 3.0 mmHg. Tricuspid Valve: The tricuspid valve is normal in structure. Tricuspid valve regurgitation is trivial. No evidence of tricuspid stenosis. Aortic Valve: The aortic valve is normal in structure. Aortic valve regurgitation is not visualized. No aortic stenosis is present. Pulmonic Valve: The pulmonic valve was normal in structure. Pulmonic valve regurgitation is mild. No evidence of pulmonic stenosis. Aorta: The aortic root is normal in size and structure. Venous: The inferior vena cava is dilated in size with less than 50% respiratory variability, suggesting right atrial pressure of 15 mmHg. IAS/Shunts: No atrial level shunt detected by color flow Doppler.  LEFT  VENTRICLE PLAX 2D LVIDd:         5.30 cm   Diastology LVIDs:         3.90 cm   LV e' medial:    4.80 cm/s LV PW:         0.80 cm   LV E/e' medial:  26.5 LV IVS:        0.80 cm   LV e' lateral:   6.38 cm/s LVOT diam:     2.00 cm   LV E/e' lateral: 19.9 LV SV:         85 LV SV Index:   31 LVOT Area:     3.14 cm  RIGHT VENTRICLE            IVC RV S prime:     9.08 cm/s  IVC diam: 2.40 cm TAPSE (M-mode): 1.8 cm LEFT ATRIUM             Index        RIGHT ATRIUM           Index LA diam:        4.50 cm 1.64 cm/m   RA Area:     14.30 cm LA Vol (A2C):   56.8 ml 20.65 ml/m  RA Volume:   37.40 ml  13.60 ml/m LA Vol (A4C):   48.6 ml 17.67 ml/m LA Biplane Vol: 54.0 ml 19.63 ml/m  AORTIC VALVE LVOT Vmax:   126.00 cm/s LVOT Vmean:  86.400 cm/s LVOT VTI:    0.272 m  AORTA Ao Root diam: 2.80 cm Ao Asc diam:  3.40 cm MITRAL VALVE                TRICUSPID VALVE MV Area (PHT): 2.73 cm     TR Peak grad:   47.9 mmHg MV Area VTI:   1.55 cm     TR Mean grad:   28.0 mmHg MV Peak grad:  11.2 mmHg    TR Vmax:        346.00 cm/s MV Mean grad:  3.0 mmHg     TR Vmean:       253.0 cm/s MV Vmax:       1.67 m/s MV Vmean:  72.8 cm/s    SHUNTS MV Decel Time: 278 msec     Systemic VTI:  0.27 m MR Peak grad: 74.3 mmHg     Systemic Diam: 2.00 cm MR Mean grad: 51.0 mmHg MR Vmax:      431.00 cm/s MR Vmean:     341.0 cm/s MV E velocity: 127.00 cm/s MV A velocity: 79.60 cm/s MV E/A ratio:  1.60 Weston Brass MD Electronically signed by Weston Brass MD Signature Date/Time: 01/12/2023/5:19:43 PM    Final         Scheduled Meds:  atorvastatin  40 mg Oral q1800   buPROPion ER  100 mg Oral q morning   [START ON 01/15/2023] furosemide  40 mg Oral Daily   insulin aspart  0-15 Units Subcutaneous TID WC   insulin aspart  0-5 Units Subcutaneous QHS   insulin glargine-yfgn  45 Units Subcutaneous QHS   levothyroxine  50 mcg Oral Q0600   potassium chloride  10 mEq Oral Daily   rivaroxaban  20 mg Oral Daily   sodium chloride flush  3 mL  Intravenous Q12H   traZODone  150 mg Oral QHS   Continuous Infusions:   LOS: 3 days    Time spent: 52 minutes spent on chart review, discussion with nursing staff, consultants, updating family and interview/physical exam; more than 50% of that time was spent in counseling and/or coordination of care.    Alvira Philips [place], DO [facility] Available via Epic secure chat 7am-7pm After these hours, please refer to coverage provider listed on [email or web address] 01/14/2023, 12:04 PM

## 2023-01-15 DIAGNOSIS — I5033 Acute on chronic diastolic (congestive) heart failure: Secondary | ICD-10-CM | POA: Diagnosis not present

## 2023-01-15 LAB — BASIC METABOLIC PANEL
Anion gap: 5 (ref 5–15)
BUN: 42 mg/dL — ABNORMAL HIGH (ref 6–20)
CO2: 22 mmol/L (ref 22–32)
Calcium: 8.2 mg/dL — ABNORMAL LOW (ref 8.9–10.3)
Chloride: 106 mmol/L (ref 98–111)
Creatinine, Ser: 1.94 mg/dL — ABNORMAL HIGH (ref 0.44–1.00)
GFR, Estimated: 32 mL/min — ABNORMAL LOW (ref >60)
Glucose, Bld: 87 mg/dL (ref 70–99)
Potassium: 5 mmol/L (ref 3.5–5.1)
Sodium: 133 mmol/L — ABNORMAL LOW (ref 135–145)

## 2023-01-15 LAB — GLUCOSE, CAPILLARY
Glucose-Capillary: 72 mg/dL (ref 70–99)
Glucose-Capillary: 72 mg/dL (ref 70–99)
Glucose-Capillary: 79 mg/dL (ref 70–99)
Glucose-Capillary: 79 mg/dL (ref 70–99)
Glucose-Capillary: 84 mg/dL (ref 70–99)

## 2023-01-15 MED ORDER — SODIUM CHLORIDE 0.9 % IV SOLN: INTRAVENOUS | Status: AC

## 2023-01-15 NOTE — Plan of Care (Signed)

## 2023-01-15 NOTE — Care Management Important Message (Signed)
Important Message  Patient Details IM Letter given. Name: Karen Bennett MRN: [id number] Date of Birth: 20-Nov-1975   Medicare Important Message Given:  Yes     [name] 01/15/2023, 2:19 PM

## 2023-01-15 NOTE — Progress Notes (Signed)
PROGRESS NOTE    Karen Bennett  JWJ:[id number] DOB: 1976-06-17 DOA: 01/11/2023 PCP: Karen Rutherford, NP    Brief Narrative:   Karen Bennett is a 47 y.o. female with past medical history significant for IDDM, HTN, CKD stage IIIa, HLD, hypothyroidism, prior PE on Xarelto, s/p right BKA, history of endocarditis, depression/anxiety who presented to the ED for evaluation of shortness of breath. Patient states she woke up this morning feeling short of breath.  She has noticed that her dyspnea is worse when she lays flat.  She has been experiencing some midsternal chest discomfort described as a pulling sensation without radiation.  She has not had any diaphoresis, nausea, fevers, chills, or cough.  She has noticed increasing swelling to her left leg over the last week.  She reports good urine output.  She reports adherence to Xarelto.  She denies any similar chest discomfort in the past.   In the ED BP 177/101, pulse 98, RR 18, temp 98.5 F, SpO2 92% on room air. Sodium 134, potassium 4.8, bicarb 22, BUN 25, creatinine 1.37, serum glucose 259, WBC 6.7, hemoglobin 12.5, platelets 282,000, BNP 241.8. Chest x-ray showed cardiomegaly with layering right pleural effusion and superimposed airspace opacity. Patient was given IV Lasix 40 mg.  The hospitalist service was consulted to admit for further evaluation and management.  Assessment & Plan:   Acute on chronic HFpEF with right pleural effusion: Presenting with PND, orthopnea, increased peripheral edema from baseline although exam limited due to large body habitus.  CXR with right pleural effusion.  BNP 241.8.  Lower suspicion for pneumonia given no leukocytosis and patient is afebrile.  Last TTE 02/2022 showed EF 55%, G1DD.  She is reporting midsternal chest discomfort; troponin within normal limits.  TTE with LVEF 50-55%, grade 2 diastolic dysfunction, RV mildly enlarged, severely elevated PAP 62.9 mmHg, mild/moderate M RRR, no aortic stenosis, IVC  dilated. -- Discontinue IV furosemide today, will plan to transition to oral furosemide tomorrow -- Strict I/O's and daily weights -- Repeat BMP in the a.m.  Asymptomatic bradycardia with sinus pauses Noted 2.4-second sinus pause with bradycardia just following admission.  On metoprolol 12.5 mg p.o. twice daily at baseline. -- Discontinued metoprolol -- Continue monitor on telemetry   Insulin-dependent type 2 diabetes; with hyperglycemia Hemoglobin A1c 10.7 on 01/12/2023, poorly controlled.  Home regimen includes Semglee 50 units subcutaneously daily, Humalog sliding scale. -- Semglee 45 units  qHS -- SSI for coverage -- CBGs qAC/HS   CKD stage IIIa: Baseline creatinine 1.3-1.4; -- Cr 1.37>>1.94 -- Holding IV furosemide; gentle IVF hydration -- repeat BMP in am   Hypertension: -- Discontinue metoprolol given bradycardia, sinus pauses -- Hydralazine 25 mg p.o. every 6 hours as needed SBP greater than 165   History of PE: -- Continue Xarelto.   Hypothyroidism: -- Continue Synthroid.   Hyperlipidemia: -- Continue atorvastatin 40 mg p.o. daily   Depression/anxiety: -- Continue Wellbutrin and trazodone.   Morbid obesity: Body mass index is 67.79 kg/m.  Discussed with patient needs for aggressive lifestyle changes/weight loss as this complicates all facets of care.  Outpatient follow-up with PCP.  May benefit from bariatric evaluation outpatient.   DVT prophylaxis:  rivaroxaban (XARELTO) tablet 20 mg    Code Status: Full Code Family Communication: No family present at bedside this morning  Disposition Plan:  Level of care: Telemetry Status is: Inpatient Remains inpatient appropriate because: Anticipate likely discharge home tomorrow    Consultants:  None  Procedures:  None  Antimicrobials:  None   Subjective: Patient seen examined bedside, lying in bed.  Watching her iPad.  Use report dyspnea much improved.  Creatinine now up to 1.9, holding further IV  diuresis at this point and will give gentle IV fluid hydration today.  And repeat BMP in the morning.  No other specific complaints or concerns at this time.  Denies headache, no dizziness, no chest pain, no palpitations, no abdominal pain, no fever/chills/night sweats, no nausea/vomiting/diarrhea, no focal weakness, no fatigue, no paresthesias.  No acute events overnight per nursing staff.  Objective: Vitals:   01/14/23 1326 01/14/23 2119 01/15/23 0500 01/15/23 0514  BP: (!) 112/44 (!) 140/54  (!) 128/52  Pulse: 68 75  60  Resp: 19 18  18   Temp: 98.2 F (36.8 C) 98.3 F (36.8 C)  98.2 F (36.8 C)  TempSrc: Oral Oral  Oral  SpO2: 93% 91%  91%  Weight:   (!) 196.9 kg   Height:        Intake/Output Summary (Last 24 hours) at 01/15/2023 1051 Last data filed at 01/15/2023 0518 Gross per 24 hour  Intake 360 ml  Output 650 ml  Net -290 ml   Filed Weights   01/13/23 0432 01/14/23 0817 01/15/23 0500  Weight: (!) 193.2 kg (!) 195.1 kg (!) 196.9 kg    Examination:  Physical Exam: GEN: NAD, alert and oriented x 3, morbidly obese, chronically ill appearance, appears older than stated age HEENT: NCAT, PERRL, EOMI, sclera clear, MMM PULM: CTAB w/o wheezes/crackles, normal respiratory effort, on room air CV: RRR w/o M/G/R GI: abd soft, NTND, NABS, no R/G/M MSK: Nonpitting peripheral edema bilateral lower extremities, noted right BKA, moves all extremities independently NEURO: CN II-XII intact, no focal deficits, sensation to light touch intact PSYCH: normal mood/affect Integumentary: No concerning rashes/lesions/wounds noted on exposed skin surfaces.    Data Reviewed: I have personally reviewed following labs and imaging studies  CBC: Recent Labs  Lab 01/11/23 1514 01/12/23 0130  WBC 6.7 6.3  HGB 12.5 11.1*  HCT 39.2 34.8*  MCV 92.2 92.1  PLT 282 263   Basic Metabolic Panel: Recent Labs  Lab 01/11/23 1514 01/12/23 0130 01/13/23 0526 01/14/23 0538 01/15/23 0422  NA  134* 134* 130* 133* 133*  K 4.8 5.0 4.9 5.0 5.0  CL 107 106 107 105 106  CO2 22 23 17* 19* 22  GLUCOSE 259* 215* 202* 112* 87  BUN 25* 27* 33* 35* 42*  CREATININE 1.37* 1.47* 1.59* 1.80* 1.94*  CALCIUM 8.5* 8.3* 8.3* 8.2* 8.2*  MG  --  1.8 1.9  --   --    GFR: Estimated Creatinine Clearance: 66.1 mL/min (A) (by C-G formula based on SCr of 1.94 mg/dL (H)). Liver Function Tests: No results for input(s): "AST", "ALT", "ALKPHOS", "BILITOT", "PROT", "ALBUMIN" in the last 168 hours. No results for input(s): "LIPASE", "AMYLASE" in the last 168 hours. No results for input(s): "AMMONIA" in the last 168 hours. Coagulation Profile: No results for input(s): "INR", "PROTIME" in the last 168 hours. Cardiac Enzymes: No results for input(s): "CKTOTAL", "CKMB", "CKMBINDEX", "TROPONINI" in the last 168 hours. BNP (last 3 results) No results for input(s): "PROBNP" in the last 8760 hours. HbA1C: No results for input(s): "HGBA1C" in the last 72 hours.  CBG: Recent Labs  Lab 01/14/23 0732 01/14/23 1119 01/14/23 1717 01/14/23 2116 01/15/23 0737  GLUCAP 99 110* 117* 122* 72   Lipid Profile: No results for input(s): "CHOL", "HDL", "LDLCALC", "TRIG", "CHOLHDL", "LDLDIRECT" in the last 72 hours.  Thyroid Function Tests: No results for input(s): "TSH", "T4TOTAL", "FREET4", "T3FREE", "THYROIDAB" in the last 72 hours. Anemia Panel: No results for input(s): "VITAMINB12", "FOLATE", "FERRITIN", "TIBC", "IRON", "RETICCTPCT" in the last 72 hours. Sepsis Labs: No results for input(s): "PROCALCITON", "LATICACIDVEN" in the last 168 hours.  No results found for this or any previous visit (from the past 240 hour(s)).       Radiology Studies: No results found.      Scheduled Meds:  atorvastatin  40 mg Oral q1800   buPROPion ER  100 mg Oral q morning   insulin aspart  0-15 Units Subcutaneous TID WC   insulin aspart  0-5 Units Subcutaneous QHS   insulin glargine-yfgn  45 Units Subcutaneous QHS    levothyroxine  50 mcg Oral Q0600   potassium chloride  10 mEq Oral Daily   rivaroxaban  20 mg Oral Daily   sodium chloride flush  3 mL Intravenous Q12H   traZODone  150 mg Oral QHS   Continuous Infusions:  sodium chloride 50 mL/hr at 01/15/23 0942     LOS: 4 days    Time spent: 52 minutes spent on chart review, discussion with nursing staff, consultants, updating family and interview/physical exam; more than 50% of that time was spent in counseling and/or coordination of care.    Karen Philips [place], DO [facility] Available via Epic secure chat 7am-7pm After these hours, please refer to coverage provider listed on [email or web address] 01/15/2023, 10:51 AM

## 2023-01-16 DIAGNOSIS — I5033 Acute on chronic diastolic (congestive) heart failure: Secondary | ICD-10-CM | POA: Diagnosis not present

## 2023-01-16 LAB — GLUCOSE, CAPILLARY
Glucose-Capillary: 97 mg/dL (ref 70–99)
Glucose-Capillary: 67 mg/dL — ABNORMAL LOW (ref 70–99)
Glucose-Capillary: 90 mg/dL (ref 70–99)
Glucose-Capillary: 78 mg/dL (ref 70–99)
Glucose-Capillary: 114 mg/dL — ABNORMAL HIGH (ref 70–99)

## 2023-01-16 LAB — BASIC METABOLIC PANEL
Anion gap: 7 (ref 5–15)
BUN: 44 mg/dL — ABNORMAL HIGH (ref 6–20)
CO2: 20 mmol/L — ABNORMAL LOW (ref 22–32)
Calcium: 8.3 mg/dL — ABNORMAL LOW (ref 8.9–10.3)
Chloride: 107 mmol/L (ref 98–111)
Creatinine, Ser: 1.79 mg/dL — ABNORMAL HIGH (ref 0.44–1.00)
GFR, Estimated: 35 mL/min — ABNORMAL LOW (ref >60)
Glucose, Bld: 82 mg/dL (ref 70–99)
Potassium: 5.1 mmol/L (ref 3.5–5.1)
Sodium: 134 mmol/L — ABNORMAL LOW (ref 135–145)

## 2023-01-16 MED ORDER — FUROSEMIDE 20 MG PO TABS: 20.0000 mg | ORAL_TABLET | ORAL | 0 refills | Status: DC

## 2023-01-16 NOTE — Inpatient Diabetes Management (Signed)
Inpatient Diabetes Program Recommendations  AACE/ADA: New Consensus Statement on Inpatient Glycemic Control (2015)  Target Ranges:  Prepandial:   less than 140 mg/dL      Peak postprandial:   less than 180 mg/dL (1-2 hours)      Critically ill patients:  140 - 180 mg/dL   Lab Results  Component Value Date   GLUCAP 90 01/16/2023   HGBA1C 10.7 (H) 01/12/2023    Review of Glycemic Control  Diabetes history: DM2 Outpatient Diabetes medications: Semglee 50 at bedtime, Humalog 0-12 units BID Current orders for Inpatient glycemic control: Semglee 45 units at bedtime, Novolog 0-15 TID with meals and 0-5 HS  HgbA1C - 10.7%  Inpatient Diabetes Program Recommendations:    Consider decreasing Semglee to 42 units at bedtime (hypos x 2 days in am)  Continue to follow.  Thank you. Ailene Ards, RD, LDN, CDCES Inpatient Diabetes Coordinator [phone]

## 2023-01-16 NOTE — Discharge Summary (Signed)
Physician Discharge Summary  Karen Bennett:[id number] DOB: [birth year] DOA: 01/11/2023  PCP: [name], NP  Admit date: 01/11/2023 Discharge date: 01/16/2023  Admitted From: Home Disposition: Home  Recommendations for Outpatient Follow-up:  Follow up with PCP in 1-2 weeks Discontinued metoprolol due to bradycardia, sinus pause during hospitalization Started on furosemide 20 mg p.o. every other day for lower extremity edema/diastolic CHF Recommend outpatient referral for sleep study, suspicious for obstructive sleep apnea Please obtain BMP in one week to reassess renal function and electrolytes  Home Health: No Equipment/Devices: Has wheelchair at home  Discharge Condition: Stable CODE STATUS: Full code Diet recommendation: Heart healthy/consistent carb regular diet  History of present illness:  Karen Bennett is a 47 y.o. female with past medical history significant for IDDM, HTN, CKD stage IIIa, HLD, hypothyroidism, prior PE on Xarelto, s/p right BKA, history of endocarditis, depression/anxiety who presented to the ED for evaluation of shortness of breath. Patient states she woke up this morning feeling short of breath.  She has noticed that her dyspnea is worse when she lays flat.  She has been experiencing some midsternal chest discomfort described as a pulling sensation without radiation.  She has not had any diaphoresis, nausea, fevers, chills, or cough.  She has noticed increasing swelling to her left leg over the last week.  She reports good urine output.  She reports adherence to Xarelto.  She denies any similar chest discomfort in the past.   In the ED BP 177/101, pulse 98, RR 18, temp 98.5 F, SpO2 92% on room air. Sodium 134, potassium 4.8, bicarb 22, BUN 25, creatinine 1.37, serum glucose 259, WBC 6.7, hemoglobin 12.5, platelets 282,000, BNP 241.8. Chest x-ray showed cardiomegaly with layering right pleural effusion and superimposed airspace opacity. Patient was  given IV Lasix 40 mg.  The hospitalist service was consulted to admit for further evaluation and management.  Hospital course:  Acute on chronic HFpEF with right pleural effusion: Presenting with PND, orthopnea, increased peripheral edema from baseline although exam limited due to large body habitus.  CXR with right pleural effusion.  BNP 241.8.  Lower suspicion for pneumonia given no leukocytosis and patient is afebrile.  Last TTE 02/2022 showed EF 55%, G1DD.  She is reporting midsternal chest discomfort; troponin within normal limits.  TTE with LVEF 50-55%, grade 2 diastolic dysfunction, RV mildly enlarged, severely elevated PAP 62.9 mmHg, mild/moderate M RRR, no aortic stenosis, IVC dilated.  Patient was started on IV furosemide and will transition to furosemide 20 mg every other day.  Recommend repeat BMP 1 week.  Outpatient follow-up with PCP.   Asymptomatic bradycardia with sinus pauses Noted 2.4-second sinus pause with bradycardia just following admission.  On metoprolol 12.5 mg p.o. twice daily at baseline.  Home metoprolol discontinued.   Insulin-dependent type 2 diabetes; with hyperglycemia Hemoglobin A1c 10.7 on 01/12/2023, poorly controlled.  Home regimen includes Semglee 50 units subcutaneously daily, Humalog sliding scale.  Outpatient follow-up with PCP.  CKD stage IIIa: Baseline creatinine 1.3-1.4, creatinine 1.79 at time of discharge after IV diuresis.  Repeat BMP 1 week.   Hypertension: Discontinue metoprolol given bradycardia, sinus pauses; blood pressure currently controlled off of antihypertensives.   History of PE: Continue Xarelto.   Hypothyroidism: Continue Synthroid.   Hyperlipidemia: Continue atorvastatin 40 mg p.o. daily   Depression/anxiety: Continue Wellbutrin and trazodone.   Morbid obesity: Body mass index is 67.79 kg/m.  Discussed with patient needs for aggressive lifestyle changes/weight loss as this complicates all facets of care.  Outpatient follow-up  with PCP.  May benefit from bariatric evaluation outpatient.  Suspicion for OSA Recommend outpatient referral for sleep study for suspicion of obstructive sleep apnea.  Discharge Diagnoses:  Principal Problem:   Acute on chronic heart failure with preserved ejection fraction (HFpEF) (HCC) Active Problems:   Hyperlipidemia associated with type 2 diabetes mellitus (HCC)   Diabetes mellitus type 2, insulin dependent (HCC)   Chronic kidney disease, stage 3a (HCC)   History of pulmonary embolism   Anxiety and depression   Hypertension associated with diabetes (HCC)   Hypothyroidism    Discharge Instructions  Discharge Instructions     Call MD for:  difficulty breathing, headache or visual disturbances   Complete by: As directed    Call MD for:  extreme fatigue   Complete by: As directed    Call MD for:  persistant dizziness or light-headedness   Complete by: As directed    Call MD for:  persistant nausea and vomiting   Complete by: As directed    Call MD for:  severe uncontrolled pain   Complete by: As directed    Call MD for:  temperature >100.4   Complete by: As directed    Diet - low sodium heart healthy   Complete by: As directed    Increase activity slowly   Complete by: As directed       Allergies as of 01/16/2023       Reactions   Clindamycin Diarrhea, Nausea And Vomiting   Zofran Itching, Nausea And Vomiting   Doxycycline Diarrhea, Nausea And Vomiting   Morphine And Codeine Hives, Itching, Rash        Medication List     STOP taking these medications    collagenase 250 UNIT/GM ointment Commonly known as: SANTYL   metoprolol tartrate 25 MG tablet Commonly known as: LOPRESSOR       TAKE these medications    acetaminophen 325 MG tablet Commonly known as: TYLENOL Take 2 tablets (650 mg total) by mouth every 6 (six) hours as needed for mild pain or moderate pain.   atorvastatin 40 MG tablet Commonly known as: LIPITOR Take 1 tablet (40 mg total)  by mouth daily at 6 PM.   BIOTIN PO Take 2 capsules by mouth daily. Unsure of dose   buPROPion ER 100 MG 12 hr tablet Commonly known as: WELLBUTRIN SR Take 100 mg by mouth every morning.   diazepam 5 MG tablet Commonly known as: VALIUM Take 5 mg by mouth every 8 (eight) hours as needed for anxiety.   furosemide 20 MG tablet Commonly known as: Lasix Take 1 tablet (20 mg total) by mouth every other day. What changed:  medication strength how much to take when to take this   glucose blood test strip Use as instructed   glucose monitoring kit monitoring kit 1 each by Does not apply route as needed for other.   HumaLOG KwikPen 100 UNIT/ML KwikPen Generic drug: insulin lispro 0-12 Units 3 (three) times daily. Inject subcutaneously twice daily as per sliding scale: if 70-150= 0 units; 151-200= 2 u; 201-250= 4 u; 251-300= 6 u; 301-350= 8 u; 351-400= 10 u; 401-450= 12u; anything greater than 400 call physician.   hydrOXYzine 50 MG capsule Commonly known as: VISTARIL Take 50 mg by mouth every 8 (eight) hours as needed for itching.   levothyroxine 50 MCG tablet Commonly known as: SYNTHROID Take 1 tablet (50 mcg total) by mouth daily at 6 (six) AM.   metoCLOPramide 5  MG tablet Commonly known as: REGLAN Take 1 tablet (5 mg total) by mouth every 6 (six) hours as needed for nausea.   oxyCODONE 15 MG immediate release tablet Commonly known as: ROXICODONE Take 1 tablet (15 mg total) by mouth every 6 (six) hours as needed for pain.   potassium chloride 10 MEQ tablet Commonly known as: KLOR-CON Take 10 mEq by mouth daily.   promethazine 25 MG tablet Commonly known as: PHENERGAN Take 25 mg by mouth 2 (two) times daily as needed for nausea/vomiting or nausea.   Semglee (yfgn) 100 UNIT/ML Solostar Pen Generic drug: insulin glargine Inject 50 Units into the skin at bedtime.   traZODone 150 MG tablet Commonly known as: DESYREL Take 1 tablet (150 mg total) by mouth at bedtime.    Xarelto 20 MG Tabs tablet Generic drug: rivaroxaban Take 20 mg by mouth in the morning.        Allergies  Allergen Reactions   Clindamycin Diarrhea and Nausea And Vomiting   Zofran Itching and Nausea And Vomiting   Doxycycline Diarrhea and Nausea And Vomiting   Morphine And Codeine Hives, Itching and Rash    Consultations: None none   Procedures/Studies: ECHOCARDIOGRAM COMPLETE  Result Date: 01/12/2023    ECHOCARDIOGRAM REPORT   Patient Name:   MELAH EBLING Date of Exam: 01/12/2023 Medical Rec #:  [id number]       Height:       66.0 in Accession #:    [id number]      Weight:       423.3 lb Date of Birth:  [birth year]      BSA:          2.750 m Patient Age:    46 years        BP:           125/57 mmHg Patient Gender: F               HR:           56 bpm. Exam Location:  Inpatient Procedure: 2D Echo, Cardiac Doppler, Color Doppler and Intracardiac            Opacification Agent Indications:    CHF Acute Diastolic  History:        Patient has prior history of Echocardiogram examinations, most                 recent 02/22/2022. Endocarditis, Signs/Symptoms:Shortness of                 Breath; Risk Factors:Diabetes, Hypertension and Dyslipidemia.                 CKD.  Sonographer:    Milda Smart Referring Phys: [id number] VISHAL R PATEL  Sonographer Comments: Image acquisition challenging due to patient body habitus and Image acquisition challenging due to respiratory motion. Pt scanned supine due to pt unable to turn. IMPRESSIONS  1. Left ventricular ejection fraction, by estimation, is 50 to 55%. The left ventricle has low normal function. The left ventricle has no regional wall motion abnormalities. Left ventricular diastolic parameters are consistent with Grade II diastolic dysfunction (pseudonormalization). There is abnormal septal motion and the interventricular septum is flattened in systole, consistent with right ventricular pressure overload.  2. Right ventricular systolic function is  normal. The right ventricular size is mildly enlarged. There is severely elevated pulmonary artery systolic pressure. The estimated right ventricular systolic pressure is 62.9 mmHg.  3. The mitral valve is abnormal. Mild to  moderate mitral valve regurgitation. No evidence of mitral stenosis.  4. The aortic valve is normal in structure. Aortic valve regurgitation is not visualized. No aortic stenosis is present.  5. The inferior vena cava is dilated in size with <50% respiratory variability, suggesting right atrial pressure of 15 mmHg. FINDINGS  Left Ventricle: Left ventricular ejection fraction, by estimation, is 50 to 55%. The left ventricle has low normal function. The left ventricle has no regional wall motion abnormalities. Definity contrast agent was given IV to delineate the left ventricular endocardial borders. The left ventricular internal cavity size was normal in size. There is no left ventricular hypertrophy. Abnormal septal motion and the interventricular septum is flattened in systole, consistent with right ventricular pressure overload. Left ventricular diastolic parameters are consistent with Grade II diastolic dysfunction (pseudonormalization). Right Ventricle: The right ventricular size is mildly enlarged. No increase in right ventricular wall thickness. Right ventricular systolic function is normal. There is severely elevated pulmonary artery systolic pressure. The tricuspid regurgitant velocity is 3.46 m/s, and with an assumed right atrial pressure of 15 mmHg, the estimated right ventricular systolic pressure is 62.9 mmHg. Left Atrium: Left atrial size was normal in size. Right Atrium: Right atrial size was normal in size. Pericardium: There is no evidence of pericardial effusion. Mitral Valve: The mitral valve is abnormal. There is mild calcification of the mitral valve leaflet(s). Mild to moderate mitral valve regurgitation. No evidence of mitral valve stenosis. MV peak gradient, 11.2 mmHg. The  mean mitral valve gradient is 3.0 mmHg. Tricuspid Valve: The tricuspid valve is normal in structure. Tricuspid valve regurgitation is trivial. No evidence of tricuspid stenosis. Aortic Valve: The aortic valve is normal in structure. Aortic valve regurgitation is not visualized. No aortic stenosis is present. Pulmonic Valve: The pulmonic valve was normal in structure. Pulmonic valve regurgitation is mild. No evidence of pulmonic stenosis. Aorta: The aortic root is normal in size and structure. Venous: The inferior vena cava is dilated in size with less than 50% respiratory variability, suggesting right atrial pressure of 15 mmHg. IAS/Shunts: No atrial level shunt detected by color flow Doppler.  LEFT VENTRICLE PLAX 2D LVIDd:         5.30 cm   Diastology LVIDs:         3.90 cm   LV e' medial:    4.80 cm/s LV PW:         0.80 cm   LV E/e' medial:  26.5 LV IVS:        0.80 cm   LV e' lateral:   6.38 cm/s LVOT diam:     2.00 cm   LV E/e' lateral: 19.9 LV SV:         85 LV SV Index:   31 LVOT Area:     3.14 cm  RIGHT VENTRICLE            IVC RV S prime:     9.08 cm/s  IVC diam: 2.40 cm TAPSE (M-mode): 1.8 cm LEFT ATRIUM             Index        RIGHT ATRIUM           Index LA diam:        4.50 cm 1.64 cm/m   RA Area:     14.30 cm LA Vol (A2C):   56.8 ml 20.65 ml/m  RA Volume:   37.40 ml  13.60 ml/m LA Vol (A4C):   48.6 ml 17.67 ml/m LA  Biplane Vol: 54.0 ml 19.63 ml/m  AORTIC VALVE LVOT Vmax:   126.00 cm/s LVOT Vmean:  86.400 cm/s LVOT VTI:    0.272 m  AORTA Ao Root diam: 2.80 cm Ao Asc diam:  3.40 cm MITRAL VALVE                TRICUSPID VALVE MV Area (PHT): 2.73 cm     TR Peak grad:   47.9 mmHg MV Area VTI:   1.55 cm     TR Mean grad:   28.0 mmHg MV Peak grad:  11.2 mmHg    TR Vmax:        346.00 cm/s MV Mean grad:  3.0 mmHg     TR Vmean:       253.0 cm/s MV Vmax:       1.67 m/s MV Vmean:      72.8 cm/s    SHUNTS MV Decel Time: 278 msec     Systemic VTI:  0.27 m MR Peak grad: 74.3 mmHg     Systemic Diam: 2.00 cm  MR Mean grad: 51.0 mmHg MR Vmax:      431.00 cm/s MR Vmean:     341.0 cm/s MV E velocity: 127.00 cm/s MV A velocity: 79.60 cm/s MV E/A ratio:  1.60 Weston Brass MD Electronically signed by Weston Brass MD Signature Date/Time: 01/12/2023/5:19:43 PM    Final    DG Chest 2 View  Result Date: 01/11/2023 CLINICAL DATA:  COPD EXAM: CHEST - 2 VIEW COMPARISON:  CXR 10/24/21 FINDINGS: Cardiomegaly. Hazy opacity at the right lung base could represent a combination of a layering pleural effusion and atelectasis or infection. No radiographically apparent displaced fractures. Visualized upper abdomen is unremarkable. Vertebral body heights are maintained. IMPRESSION: Cardiomegaly with a layering right pleural effusion and a superimposed airspace opacity that could represent atelectasis or infection. Electronically Signed   By: Lorenza Cambridge M.D.   On: 01/11/2023 15:41     Subjective: Patient seen examined bedside, resting,.  Lying in bed.  No specific complaints this morning.  Discharging home.  Recommend close outpatient follow-up with her PCP.  No questions or concerns at this time.  Denies headache, no dizziness, no chest pain, no palpitations, no shortness of breath, no abdominal pain, no fever/chills/night sweats, no nausea/vomiting/diarrhea, no focal weakness, no fatigue, no paresthesias.  No acute events overnight per nursing staff.  Discharge Exam: Vitals:   01/15/23 2114 01/16/23 0443  BP: 130/61 (!) 125/53  Pulse: 62 61  Resp: 17 16  Temp: 98.4 F (36.9 C) 97.9 F (36.6 C)  SpO2: 91% 96%   Vitals:   01/15/23 1800 01/15/23 2114 01/16/23 0443 01/16/23 0500  BP: 130/60 130/61 (!) 125/53   Pulse: 63 62 61   Resp: 20 17 16    Temp: 98.6 F (37 C) 98.4 F (36.9 C) 97.9 F (36.6 C)   TempSrc: Oral Oral Oral   SpO2: 92% 91% 96%   Weight:    (!) 197.2 kg  Height:        Physical Exam: GEN: NAD, alert and oriented x 3, morbidly obese, chronically ill appearance, appears older than stated  age HEENT: NCAT, PERRL, EOMI, sclera clear, MMM PULM: CTAB w/o wheezes/crackles, normal respiratory effort, on room air CV: RRR w/o M/G/R GI: abd soft, NTND, NABS, no R/G/M MSK: Nonpitting peripheral edema bilateral lower extremities, noted right BKA, moves all extremities independently NEURO: CN II-XII intact, no focal deficits, sensation to light touch intact PSYCH: normal mood/affect Integumentary: No  concerning rashes/lesions/wounds noted on exposed skin surfaces.    The results of significant diagnostics from this hospitalization (including imaging, microbiology, ancillary and laboratory) are listed below for reference.     Microbiology: No results found for this or any previous visit (from the past 240 hour(s)).   Labs: BNP (last 3 results) Recent Labs    01/11/23 1514 01/11/23 2346  BNP 241.8* 227.9*   Basic Metabolic Panel: Recent Labs  Lab 01/12/23 0130 01/13/23 0526 01/14/23 0538 01/15/23 0422 01/16/23 0446  NA 134* 130* 133* 133* 134*  K 5.0 4.9 5.0 5.0 5.1  CL 106 107 105 106 107  CO2 23 17* 19* 22 20*  GLUCOSE 215* 202* 112* 87 82  BUN 27* 33* 35* 42* 44*  CREATININE 1.47* 1.59* 1.80* 1.94* 1.79*  CALCIUM 8.3* 8.3* 8.2* 8.2* 8.3*  MG 1.8 1.9  --   --   --    Liver Function Tests: No results for input(s): "AST", "ALT", "ALKPHOS", "BILITOT", "PROT", "ALBUMIN" in the last 168 hours. No results for input(s): "LIPASE", "AMYLASE" in the last 168 hours. No results for input(s): "AMMONIA" in the last 168 hours. CBC: Recent Labs  Lab 01/11/23 1514 01/12/23 0130  WBC 6.7 6.3  HGB 12.5 11.1*  HCT 39.2 34.8*  MCV 92.2 92.1  PLT 282 263   Cardiac Enzymes: No results for input(s): "CKTOTAL", "CKMB", "CKMBINDEX", "TROPONINI" in the last 168 hours. BNP: Invalid input(s): "POCBNP" CBG: Recent Labs  Lab 01/15/23 2111 01/15/23 2347 01/16/23 0142 01/16/23 0728 01/16/23 0813  GLUCAP 84 79 97 67* 90   D-Dimer No results for input(s): "DDIMER" in the  last 72 hours. Hgb A1c No results for input(s): "HGBA1C" in the last 72 hours. Lipid Profile No results for input(s): "CHOL", "HDL", "LDLCALC", "TRIG", "CHOLHDL", "LDLDIRECT" in the last 72 hours. Thyroid function studies No results for input(s): "TSH", "T4TOTAL", "T3FREE", "THYROIDAB" in the last 72 hours.  Invalid input(s): "FREET3" Anemia work up No results for input(s): "VITAMINB12", "FOLATE", "FERRITIN", "TIBC", "IRON", "RETICCTPCT" in the last 72 hours. Urinalysis    Component Value Date/Time   COLORURINE YELLOW 02/23/2022 0611   APPEARANCEUR HAZY (A) 02/23/2022 0611   LABSPEC 1.011 02/23/2022 0611   PHURINE 6.0 02/23/2022 0611   GLUCOSEU 50 (A) 02/23/2022 0611   HGBUR LARGE (A) 02/23/2022 0611   BILIRUBINUR NEGATIVE 02/23/2022 0611   KETONESUR NEGATIVE 02/23/2022 0611   PROTEINUR 100 (A) 02/23/2022 0611   UROBILINOGEN 0.2 09/05/2011 0558   NITRITE NEGATIVE 02/23/2022 0611   LEUKOCYTESUR LARGE (A) 02/23/2022 0611   Sepsis Labs Recent Labs  Lab 01/11/23 1514 01/12/23 0130  WBC 6.7 6.3   Microbiology No results found for this or any previous visit (from the past 240 hour(s)).   Time coordinating discharge: Over 30 minutes  SIGNED:   Alvira Philips [place], DO  [facility] 01/16/2023, 9:57 AM

## 2023-01-16 NOTE — TOC Transition Note (Addendum)
Transition of Care [facility]) - CM/SW Discharge Note   Patient Details  Name: Karen Bennett MRN: [id number] Date of Birth: 19-Mar-1976  [facility]) CM/SW Contact:  Howell Rucks, RN Phone Number: 01/16/2023, 11:07 AM   Clinical Narrative: Met with pt at bedside to introduce role of TOC/NCM and review for dc needs. Pt to dc home today, transport via [facility] per MD request. Pt reports she has DME  and support resources in place. Pt had no questions/concerns. No further TOC needs identified.        Final next level of care: Home/Self Care Barriers to Discharge: Barriers Resolved   Patient Goals and CMS Choice      Discharge Placement                         Discharge Plan and Services Additional resources added to the After Visit Summary for     Discharge Planning Services: CM Consult Post Acute Care Choice: Resumption of Svcs/PTA Provider, Durable Medical Equipment (power wheelchair, BSC/shower chair combo; HH aide w/ [facility])                               Social Determinants of Health (SDOH) Interventions SDOH Screenings   Food Insecurity: No Food Insecurity (01/12/2023)  Housing: Patient Declined (01/12/2023)  Transportation Needs: No Transportation Needs (01/12/2023)  Utilities: Not At Risk (01/12/2023)  Tobacco Use: Low Risk  (01/11/2023)     Readmission Risk Interventions    01/16/2023   11:06 AM 02/23/2022   10:38 AM 09/12/2021    1:15 PM  Readmission Risk Prevention Plan  Transportation Screening Complete Complete   PCP or Specialist Appt within 5-7 Days Complete    Home Care Screening Complete    Medication Review (RN CM) Complete    HRI or Home Care Consult  Complete   Social Work Consult for Recovery Care Planning/Counseling  Complete   Palliative Care Screening  Not Applicable   Medication Review Oceanographer)  Complete Complete  Skilled Nursing Facility   Not Applicable

## 2023-01-16 NOTE — Progress Notes (Signed)
Hypoglycemic Event  CBG: 67  Treatment: 8 oz juice/soda  Symptoms: Pale  Follow-up CBG: Time:0813 CBG Result:90  Possible Reasons for Event: Inadequate meal intake  Comments/MD notified: MD [place]    Kazuto Sevey H Maty Zeisler

## 2023-01-16 NOTE — Plan of Care (Signed)

## 2023-01-16 NOTE — Plan of Care (Signed)
°  Problem: Education: °Goal: Ability to demonstrate management of disease process will improve °Outcome: Progressing °Goal: Ability to verbalize understanding of medication therapies will improve °Outcome: Progressing °Goal: Individualized Educational Video(s) °Outcome: Progressing °  °

## 2023-01-24 ENCOUNTER — Emergency Department (HOSPITAL_COMMUNITY): Payer: 59

## 2023-01-24 ENCOUNTER — Inpatient Hospital Stay (HOSPITAL_COMMUNITY)
Admission: EM | Admit: 2023-01-24 | Discharge: 2023-01-26 | DRG: 176 | Disposition: A | Discharge status: Home / Self Care | Payer: 59 | Attending: Family Medicine | Admitting: Family Medicine

## 2023-01-24 ENCOUNTER — Other Ambulatory Visit: Payer: Self-pay

## 2023-01-24 DIAGNOSIS — Z8739 Personal history of other diseases of the musculoskeletal system and connective tissue: Secondary | ICD-10-CM

## 2023-01-24 DIAGNOSIS — N1831 Chronic kidney disease, stage 3a: Secondary | ICD-10-CM | POA: Diagnosis present

## 2023-01-24 DIAGNOSIS — Z89511 Acquired absence of right leg below knee: Secondary | ICD-10-CM

## 2023-01-24 DIAGNOSIS — Z79899 Other long term (current) drug therapy: Secondary | ICD-10-CM

## 2023-01-24 DIAGNOSIS — I152 Hypertension secondary to endocrine disorders: Secondary | ICD-10-CM | POA: Diagnosis present

## 2023-01-24 DIAGNOSIS — Z86711 Personal history of pulmonary embolism: Secondary | ICD-10-CM

## 2023-01-24 DIAGNOSIS — F32A Depression, unspecified: Secondary | ICD-10-CM | POA: Diagnosis present

## 2023-01-24 DIAGNOSIS — Z833 Family history of diabetes mellitus: Secondary | ICD-10-CM

## 2023-01-24 DIAGNOSIS — Z7901 Long term (current) use of anticoagulants: Secondary | ICD-10-CM

## 2023-01-24 DIAGNOSIS — Z794 Long term (current) use of insulin: Secondary | ICD-10-CM

## 2023-01-24 DIAGNOSIS — I2699 Other pulmonary embolism without acute cor pulmonale: Secondary | ICD-10-CM | POA: Diagnosis not present

## 2023-01-24 DIAGNOSIS — F419 Anxiety disorder, unspecified: Secondary | ICD-10-CM | POA: Diagnosis present

## 2023-01-24 DIAGNOSIS — Z6841 Body Mass Index (BMI) 40.0 and over, adult: Secondary | ICD-10-CM

## 2023-01-24 DIAGNOSIS — Z888 Allergy status to other drugs, medicaments and biological substances status: Secondary | ICD-10-CM

## 2023-01-24 DIAGNOSIS — Z885 Allergy status to narcotic agent status: Secondary | ICD-10-CM

## 2023-01-24 DIAGNOSIS — Z881 Allergy status to other antibiotic agents status: Secondary | ICD-10-CM

## 2023-01-24 DIAGNOSIS — E1122 Type 2 diabetes mellitus with diabetic chronic kidney disease: Secondary | ICD-10-CM | POA: Diagnosis present

## 2023-01-24 DIAGNOSIS — Z79890 Hormone replacement therapy (postmenopausal): Secondary | ICD-10-CM

## 2023-01-24 DIAGNOSIS — Z823 Family history of stroke: Secondary | ICD-10-CM

## 2023-01-24 DIAGNOSIS — R0602 Shortness of breath: Secondary | ICD-10-CM | POA: Diagnosis not present

## 2023-01-24 DIAGNOSIS — I272 Pulmonary hypertension, unspecified: Secondary | ICD-10-CM | POA: Diagnosis present

## 2023-01-24 DIAGNOSIS — Z91148 Patient's other noncompliance with medication regimen for other reason: Secondary | ICD-10-CM

## 2023-01-24 DIAGNOSIS — Z8249 Family history of ischemic heart disease and other diseases of the circulatory system: Secondary | ICD-10-CM

## 2023-01-24 DIAGNOSIS — E039 Hypothyroidism, unspecified: Secondary | ICD-10-CM | POA: Diagnosis present

## 2023-01-24 DIAGNOSIS — J189 Pneumonia, unspecified organism: Principal | ICD-10-CM

## 2023-01-24 DIAGNOSIS — E785 Hyperlipidemia, unspecified: Secondary | ICD-10-CM | POA: Diagnosis present

## 2023-01-24 DIAGNOSIS — I5032 Chronic diastolic (congestive) heart failure: Secondary | ICD-10-CM | POA: Diagnosis present

## 2023-01-24 DIAGNOSIS — I2693 Single subsegmental pulmonary embolism without acute cor pulmonale: Secondary | ICD-10-CM

## 2023-01-24 DIAGNOSIS — E1169 Type 2 diabetes mellitus with other specified complication: Secondary | ICD-10-CM | POA: Diagnosis present

## 2023-01-24 LAB — BRAIN NATRIURETIC PEPTIDE: B Natriuretic Peptide: 321 pg/mL — ABNORMAL HIGH (ref 0.0–100.0)

## 2023-01-24 LAB — CBC WITH DIFFERENTIAL/PLATELET
Abs Immature Granulocytes: 0.04 10*3/uL (ref 0.00–0.07)
Basophils Absolute: 0 10*3/uL (ref 0.0–0.1)
Basophils Relative: 1 %
Eosinophils Absolute: 0.2 10*3/uL (ref 0.0–0.5)
Eosinophils Relative: 2 %
HCT: 34.1 % — ABNORMAL LOW (ref 36.0–46.0)
Hemoglobin: 10.9 g/dL — ABNORMAL LOW (ref 12.0–15.0)
Immature Granulocytes: 1 %
Lymphocytes Relative: 28 %
Lymphs Abs: 2 10*3/uL (ref 0.7–4.0)
MCH: 29.1 pg (ref 26.0–34.0)
MCHC: 32 g/dL (ref 30.0–36.0)
MCV: 91.2 fL (ref 80.0–100.0)
Monocytes Absolute: 0.4 10*3/uL (ref 0.1–1.0)
Monocytes Relative: 6 %
Neutro Abs: 4.4 10*3/uL (ref 1.7–7.7)
Neutrophils Relative %: 62 %
Platelets: UNDETERMINED 10*3/uL (ref 150–400)
RBC: 3.74 MIL/uL — ABNORMAL LOW (ref 3.87–5.11)
RDW: 12.8 % (ref 11.5–15.5)
WBC: 7.1 10*3/uL (ref 4.0–10.5)
nRBC: 0 % (ref 0.0–0.2)

## 2023-01-24 LAB — COMPREHENSIVE METABOLIC PANEL
ALT: 8 U/L (ref 0–44)
AST: 12 U/L — ABNORMAL LOW (ref 15–41)
Albumin: 2.2 g/dL — ABNORMAL LOW (ref 3.5–5.0)
Alkaline Phosphatase: 58 U/L (ref 38–126)
Anion gap: 7 (ref 5–15)
BUN: 20 mg/dL (ref 6–20)
CO2: 22 mmol/L (ref 22–32)
Calcium: 8.3 mg/dL — ABNORMAL LOW (ref 8.9–10.3)
Chloride: 105 mmol/L (ref 98–111)
Creatinine, Ser: 1.35 mg/dL — ABNORMAL HIGH (ref 0.44–1.00)
GFR, Estimated: 49 mL/min — ABNORMAL LOW (ref >60)
Glucose, Bld: 244 mg/dL — ABNORMAL HIGH (ref 70–99)
Potassium: 4 mmol/L (ref 3.5–5.1)
Sodium: 134 mmol/L — ABNORMAL LOW (ref 135–145)
Total Bilirubin: 0.7 mg/dL (ref 0.3–1.2)
Total Protein: 6.8 g/dL (ref 6.5–8.1)

## 2023-01-24 LAB — TROPONIN I (HIGH SENSITIVITY): Troponin I (High Sensitivity): 13 ng/L (ref <18)

## 2023-01-24 LAB — HCG, QUANTITATIVE, PREGNANCY: hCG, Beta Chain, Quant, S: 9 m[IU]/mL — ABNORMAL HIGH (ref <5)

## 2023-01-24 MED ORDER — HYDRALAZINE HCL 20 MG/ML IJ SOLN
5.0000 mg | Freq: Once | INTRAMUSCULAR | Status: AC
  Administered 2023-01-24: 5 mg via INTRAVENOUS
  Filled 2023-01-24: qty 1

## 2023-01-24 MED ORDER — ACETAMINOPHEN 500 MG PO TABS
1000.0000 mg | ORAL_TABLET | Freq: Once | ORAL | Status: AC
  Administered 2023-01-24: 1000 mg via ORAL
  Filled 2023-01-24: qty 2

## 2023-01-24 MED ORDER — METOCLOPRAMIDE HCL 10 MG PO TABS
10.0000 mg | ORAL_TABLET | Freq: Once | ORAL | Status: AC
  Administered 2023-01-24: 10 mg via ORAL
  Filled 2023-01-24: qty 1

## 2023-01-24 MED ORDER — OXYCODONE HCL 5 MG PO TABS
5.0000 mg | ORAL_TABLET | Freq: Once | ORAL | Status: AC
  Administered 2023-01-24: 5 mg via ORAL
  Filled 2023-01-24: qty 1

## 2023-01-24 MED ORDER — LORAZEPAM 0.5 MG PO TABS
0.5000 mg | ORAL_TABLET | Freq: Once | ORAL | Status: AC
  Administered 2023-01-24: 0.5 mg via ORAL
  Filled 2023-01-24: qty 1

## 2023-01-24 NOTE — ED Provider Notes (Signed)
[place] EMERGENCY DEPARTMENT AT [facility] Provider Note   CSN: [id number] Arrival date & time: 01/24/23  1948     History {Add pertinent medical, surgical, social history, OB history to HPI:1} Chief Complaint  Patient presents with  . Shortness of Breath    Karen Bennett is a 48 y.o. female with history of PE, T2DM, morbid obesity, history of necrotizing fasciitis status post right BKA, history of infective endocarditis, HFpEF, CKD stage III, hypothyroidism, who presents with chest pain, shortness of breath X1 week.  Pt was diagnosed w/ CHF last week, waiting on followup appointment.  She is takes Lasix every other day.  She states the symptoms of shortness of breath are worse when she lays down or tries to move around.  Lungs clear, slightly diminished on right lower.  Several other ongoing complaints include nausea, chest pain and left-sided rib pain that has been ongoing for a couple of weeks.  She endorses lower extremity swelling that was improved by the Lasix.   204/92 93HR 96% RA 26 resp CBG 253   Shortness of Breath      Home Medications Prior to Admission medications   Medication Sig Start Date End Date Taking? Authorizing Provider  acetaminophen (TYLENOL) 325 MG tablet Take 2 tablets (650 mg total) by mouth every 6 (six) hours as needed for mild pain or moderate pain. 02/09/21   Hollice Espy, MD  atorvastatin (LIPITOR) 40 MG tablet Take 1 tablet (40 mg total) by mouth daily at 6 PM. 05/12/13   Buriev, Isaiah Serge, MD  BIOTIN PO Take 2 capsules by mouth daily. Unsure of dose    [provider]  buPROPion ER (WELLBUTRIN SR) 100 MG 12 hr tablet Take 100 mg by mouth every morning. 05/09/21   [provider]  diazepam (VALIUM) 5 MG tablet Take 5 mg by mouth every 8 (eight) hours as needed for anxiety. 03/30/21   [provider]  furosemide (LASIX) 20 MG tablet Take 1 tablet (20 mg total) by mouth every other day. 01/16/23 02/15/23   [place], Eric J, DO  glucose blood test strip Use as instructed 06/26/13   Advani, Ayesha Rumpf, MD  glucose monitoring kit (FREESTYLE) monitoring kit 1 each by Does not apply route as needed for other. 06/26/13   Doris Cheadle, MD  HUMALOG KWIKPEN 100 UNIT/ML KwikPen 0-12 Units 3 (three) times daily. Inject subcutaneously twice daily as per sliding scale: if 70-150= 0 units; 151-200= 2 u; 201-250= 4 u; 251-300= 6 u; 301-350= 8 u; 351-400= 10 u; 401-450= 12u; anything greater than 400 call physician. 10/27/20   [provider]  hydrOXYzine (VISTARIL) 50 MG capsule Take 50 mg by mouth every 8 (eight) hours as needed for itching.    [provider]  insulin glargine (SEMGLEE, YFGN,) 100 UNIT/ML Solostar Pen Inject 50 Units into the skin at bedtime. 03/30/21   [provider]  levothyroxine (SYNTHROID) 50 MCG tablet Take 1 tablet (50 mcg total) by mouth daily at 6 (six) AM. 10/26/21   Leroy Sea, MD  metoCLOPramide (REGLAN) 5 MG tablet Take 1 tablet (5 mg total) by mouth every 6 (six) hours as needed for nausea. 02/09/21   Hollice Espy, MD  oxyCODONE (ROXICODONE) 15 MG immediate release tablet Take 1 tablet (15 mg total) by mouth every 6 (six) hours as needed for pain. 09/13/21   Meredeth Ide, MD  potassium chloride (KLOR-CON) 10 MEQ tablet Take 10 mEq by mouth daily. 05/09/21  [provider]  promethazine (PHENERGAN) 25 MG tablet Take 25 mg by mouth 2 (two) times daily as needed for nausea/vomiting or nausea. 01/12/21   [provider]  traZODone (DESYREL) 150 MG tablet Take 1 tablet (150 mg total) by mouth at bedtime. 03/02/22   Lewie Chamber, MD  XARELTO 20 MG TABS tablet Take 20 mg by mouth in the morning. 11/12/20   [provider]      Allergies    Clindamycin, Zofran, Doxycycline, and Morphine and codeine    Review of Systems   Review of Systems  Respiratory:  Positive for shortness of breath.    A 10 point review of systems was performed  and is negative unless otherwise reported in HPI.  Physical Exam Updated Vital Signs BP (!) 191/99   Pulse 64   Temp 98 F (36.7 C) (Oral)   Resp 17   Ht 5\' 6"  (1.676 m)   Wt (!) 186.9 kg   LMP 04/13/2013   SpO2 95%   BMI 66.50 kg/m  Physical Exam General: Morbidly obese female not in any acute distress sitting in bed.Marland Kitchen  HEENT: PERRLA, Sclera anicteric, MMM, trachea midline.  Cardiology: RRR, no murmurs/rubs/gallops. BL radial and DP pulses equal bilaterally.  Resp: Normal respiratory rate and effort.  Crackles in right lower lung fields. Abd: Soft, non-tender, non-distended. No rebound tenderness or guarding.  GU: Deferred. MSK: Status post right BKA.  1+ pitting edema in left lower extremity up to knee.  Extremities without deformity or TTP. No cyanosis or clubbing. Skin: warm, dry. Neuro: A&Ox4, CNs II-XII grossly intact. MAEs. Sensation grossly intact.  Psych: Normal mood and affect.   ED Results / Procedures / Treatments   Labs (all labs ordered are listed, but only abnormal results are displayed) Labs Reviewed  CBC WITH DIFFERENTIAL/PLATELET - Abnormal; Notable for the following components:      Result Value   RBC 3.74 (*)    Hemoglobin 10.9 (*)    HCT 34.1 (*)    All other components within normal limits  COMPREHENSIVE METABOLIC PANEL - Abnormal; Notable for the following components:   Sodium 134 (*)    Glucose, Bld 244 (*)    Creatinine, Ser 1.35 (*)    Calcium 8.3 (*)    Albumin 2.2 (*)    AST 12 (*)    GFR, Estimated 49 (*)    All other components within normal limits  HCG, QUANTITATIVE, PREGNANCY - Abnormal; Notable for the following components:   hCG, Beta Chain, Quant, S 9 (*)    All other components within normal limits  BRAIN NATRIURETIC PEPTIDE - Abnormal; Notable for the following components:   B Natriuretic Peptide 321.0 (*)    All other components within normal limits  TROPONIN I (HIGH SENSITIVITY)  TROPONIN I (HIGH SENSITIVITY)     EKG None  Radiology DG Chest 2 View  Result Date: 01/24/2023 CLINICAL DATA:  Shortness of breath EXAM: CHEST - 2 VIEW COMPARISON:  01/11/2023 FINDINGS: Mild patchy right lower lobe opacity, suspicious for pneumonia. Small right pleural effusion. No pneumothorax. The heart is normal in size. IMPRESSION: Mild patchy right lower lobe opacity, suspicious for pneumonia. Small right pleural effusion. Electronically Signed   By: Charline Bills M.D.   On: 01/24/2023 21:41    Procedures Procedures  {Document cardiac monitor, telemetry assessment procedure when appropriate:1}  Medications Ordered in ED Medications  hydrALAZINE (APRESOLINE) injection 5 mg (has no administration in time range)  LORazepam (ATIVAN) tablet 0.5 mg (  0.5 mg Oral Given 01/24/23 2320)  metoCLOPramide (REGLAN) tablet 10 mg (10 mg Oral Given 01/24/23 2327)  oxyCODONE (Oxy IR/ROXICODONE) immediate release tablet 5 mg (5 mg Oral Given 01/24/23 2320)  acetaminophen (TYLENOL) tablet 1,000 mg (1,000 mg Oral Given 01/24/23 2320)    ED Course/ Medical Decision Making/ A&P                          Medical Decision Making Amount and/or Complexity of Data Reviewed Labs: ordered. Radiology: ordered.  Risk OTC drugs. Prescription drug management.    This patient presents to the ED for concern of SOB/CP, this involves an extensive number of treatment options, and is a complaint that carries with it a high risk of complications and morbidity.  I considered the following differential and admission for this acute, potentially life threatening condition.   MDM:    DDX for dyspnea includes but is not limited to:  Cardiac- CHF, Myocardial Ischemia, Valvular heart disease, Arrythmia, Cardiac tamponade   Respiratory - Pneumonia / atelectasis / pulmonary effusion / cavitary lung disease, Pneumothorax, COPD/ reactive airway disease, PE, ARDS   Other - Sepsis, Anemia   DDX for chest pain includes but is not limited  to:  ACS/arrhythmia,  PE, aortic dissection, PNA, PTX, esophogeal rupture, biliary disease, cardiac tamponade, pericarditis, GERD/PUD/gastritis, or musculoskeletal pain. Very low suspicion for ACS vs aortic dissection given presenting sx. Patient cannot PERC out based on tachycardia, but will obtain D dimer and reassess, minimal risk factors for PE. No c/f dissection. No abdominal pain and no c/f biliary disease. Consider GERD, given known history.   Update and Plan: -cbc, cmp, cardiac markers, mag, ECG, CXR -ECG: -CXR:    Clinical Course as of 01/24/23 2347  Wed Jan 24, 2023  2342 DG Chest 2 View Mild patchy right lower lobe opacity, suspicious for pneumonia.  Small right pleural effusion.   [HN]  2342 WBC: 7.1 No leukocytosis  [HN]  2342 B Natriuretic Peptide(!): 321.0 Elevated from two weeks ago [HN]  2343 Troponin I (High Sensitivity): 13 Neg initial trop [HN]    Clinical Course User Index [HN] Loetta Rough, MD    Labs: I Ordered, and personally interpreted labs.  The pertinent results include:  those listed above  Imaging Studies ordered: I ordered imaging studies including CXR, CT PE I independently visualized and interpreted imaging. I agree with the radiologist interpretation  Additional history obtained from chart review, friend at bedside.    Cardiac Monitoring: .The patient was maintained on a cardiac monitor.  I personally viewed and interpreted the cardiac monitored which showed an underlying rhythm of: NSR  Reevaluation: After the interventions noted above, I reevaluated the patient and found that they have :stayed the same  Social Determinants of Health: .Patient lives independently   Disposition:  Patient is signed out to the oncoming ED physician Dr. Adela Lank who is made aware of her history, presentation, exam, workup, and plan.    Co morbidities that complicate the patient evaluation . Past Medical History:  Diagnosis Date  . Anxiety and  depression 06/26/2013  . Cellulitis   . Chronic kidney disease, stage 3a (HCC) 01/11/2023  . Diabetes mellitus   . Hyperlipidemia associated with type 2 diabetes mellitus (HCC) 05/02/2013  . Hypertension associated with diabetes (HCC) 12/27/2020  . Hypothyroidism   . Infective endocarditis   . PE (pulmonary embolism) ~2007-2008   Not on anticoagulation     Medicines Meds ordered this  encounter  Medications  . hydrALAZINE (APRESOLINE) injection 5 mg  . LORazepam (ATIVAN) tablet 0.5 mg  . metoCLOPramide (REGLAN) tablet 10 mg  . oxyCODONE (Oxy IR/ROXICODONE) immediate release tablet 5 mg  . acetaminophen (TYLENOL) tablet 1,000 mg    I have reviewed the patients home medicines and have made adjustments as needed  Problem List / ED Course: Problem List Items Addressed This Visit   None        {Document critical care time when appropriate:1} {Document review of labs and clinical decision tools ie heart score, Chads2Vasc2 etc:1}  {Document your independent review of radiology images, and any outside records:1} {Document your discussion with family members, caretakers, and with consultants:1} {Document social determinants of health affecting pt's care:1} {Document your decision making why or why not admission, treatments were needed:1}  This note was created using dictation software, which may contain spelling or grammatical errors.

## 2023-01-24 NOTE — ED Triage Notes (Addendum)
Per EMS,pt from home, c/o SOB X1 week.  Pt was diagnosed w/ CHF last week, waiting on followup appointment.  Lungs clear, slightly diminished on right lower.  Several other ongoing complaints include nausea, chest pain and rib pain (a few weeks.)  204/92 93HR 96% RA 26 resp CBG 253  Pt reports she continues to have SOB and it has gotten worse despite reported compliance w/ new medications.  She states she is now unable to sleep, last night had to sleep sitting up.  Pt is unsure how much fluid she drinks but arrived w/ a big cup full of juice.

## 2023-01-25 ENCOUNTER — Encounter (HOSPITAL_COMMUNITY): Payer: Self-pay

## 2023-01-25 ENCOUNTER — Emergency Department (HOSPITAL_COMMUNITY): Payer: 59

## 2023-01-25 DIAGNOSIS — E039 Hypothyroidism, unspecified: Secondary | ICD-10-CM | POA: Diagnosis present

## 2023-01-25 DIAGNOSIS — I272 Pulmonary hypertension, unspecified: Secondary | ICD-10-CM | POA: Diagnosis present

## 2023-01-25 DIAGNOSIS — Z89511 Acquired absence of right leg below knee: Secondary | ICD-10-CM | POA: Diagnosis not present

## 2023-01-25 DIAGNOSIS — Z91148 Patient's other noncompliance with medication regimen for other reason: Secondary | ICD-10-CM | POA: Diagnosis not present

## 2023-01-25 DIAGNOSIS — F419 Anxiety disorder, unspecified: Secondary | ICD-10-CM | POA: Diagnosis present

## 2023-01-25 DIAGNOSIS — J189 Pneumonia, unspecified organism: Secondary | ICD-10-CM

## 2023-01-25 DIAGNOSIS — E1169 Type 2 diabetes mellitus with other specified complication: Secondary | ICD-10-CM | POA: Diagnosis present

## 2023-01-25 DIAGNOSIS — R0602 Shortness of breath: Secondary | ICD-10-CM | POA: Diagnosis present

## 2023-01-25 DIAGNOSIS — I2699 Other pulmonary embolism without acute cor pulmonale: Secondary | ICD-10-CM | POA: Diagnosis present

## 2023-01-25 DIAGNOSIS — Z8739 Personal history of other diseases of the musculoskeletal system and connective tissue: Secondary | ICD-10-CM | POA: Diagnosis not present

## 2023-01-25 DIAGNOSIS — Z794 Long term (current) use of insulin: Secondary | ICD-10-CM | POA: Diagnosis not present

## 2023-01-25 DIAGNOSIS — I2693 Single subsegmental pulmonary embolism without acute cor pulmonale: Secondary | ICD-10-CM

## 2023-01-25 DIAGNOSIS — Z6841 Body Mass Index (BMI) 40.0 and over, adult: Secondary | ICD-10-CM | POA: Diagnosis not present

## 2023-01-25 DIAGNOSIS — E785 Hyperlipidemia, unspecified: Secondary | ICD-10-CM | POA: Diagnosis present

## 2023-01-25 DIAGNOSIS — Z833 Family history of diabetes mellitus: Secondary | ICD-10-CM | POA: Diagnosis not present

## 2023-01-25 DIAGNOSIS — I5032 Chronic diastolic (congestive) heart failure: Secondary | ICD-10-CM | POA: Diagnosis present

## 2023-01-25 DIAGNOSIS — F32A Depression, unspecified: Secondary | ICD-10-CM | POA: Diagnosis present

## 2023-01-25 DIAGNOSIS — Z79899 Other long term (current) drug therapy: Secondary | ICD-10-CM | POA: Diagnosis not present

## 2023-01-25 DIAGNOSIS — Z7901 Long term (current) use of anticoagulants: Secondary | ICD-10-CM | POA: Diagnosis not present

## 2023-01-25 DIAGNOSIS — E1122 Type 2 diabetes mellitus with diabetic chronic kidney disease: Secondary | ICD-10-CM | POA: Diagnosis present

## 2023-01-25 DIAGNOSIS — Z8249 Family history of ischemic heart disease and other diseases of the circulatory system: Secondary | ICD-10-CM | POA: Diagnosis not present

## 2023-01-25 DIAGNOSIS — Z86711 Personal history of pulmonary embolism: Secondary | ICD-10-CM | POA: Diagnosis not present

## 2023-01-25 DIAGNOSIS — Z79890 Hormone replacement therapy (postmenopausal): Secondary | ICD-10-CM | POA: Diagnosis not present

## 2023-01-25 DIAGNOSIS — Z823 Family history of stroke: Secondary | ICD-10-CM | POA: Diagnosis not present

## 2023-01-25 DIAGNOSIS — M7989 Other specified soft tissue disorders: Secondary | ICD-10-CM | POA: Diagnosis not present

## 2023-01-25 DIAGNOSIS — N1831 Chronic kidney disease, stage 3a: Secondary | ICD-10-CM | POA: Diagnosis present

## 2023-01-25 DIAGNOSIS — I152 Hypertension secondary to endocrine disorders: Secondary | ICD-10-CM | POA: Diagnosis present

## 2023-01-25 LAB — GLUCOSE, CAPILLARY
Glucose-Capillary: 255 mg/dL — ABNORMAL HIGH (ref 70–99)
Glucose-Capillary: 158 mg/dL — ABNORMAL HIGH (ref 70–99)
Glucose-Capillary: 154 mg/dL — ABNORMAL HIGH (ref 70–99)
Glucose-Capillary: 182 mg/dL — ABNORMAL HIGH (ref 70–99)

## 2023-01-25 LAB — APTT: aPTT: 37 seconds — ABNORMAL HIGH (ref 24–36)

## 2023-01-25 LAB — TROPONIN I (HIGH SENSITIVITY): Troponin I (High Sensitivity): 14 ng/L (ref <18)

## 2023-01-25 LAB — HEPARIN LEVEL (UNFRACTIONATED)
Heparin Unfractionated: <0.1 IU/mL — ABNORMAL LOW (ref 0.30–0.70)
Heparin Unfractionated: 0.23 IU/mL — ABNORMAL LOW (ref 0.30–0.70)
Heparin Unfractionated: 0.29 IU/mL — ABNORMAL LOW (ref 0.30–0.70)

## 2023-01-25 LAB — C-REACTIVE PROTEIN: CRP: 0.7 mg/dL (ref <1.0)

## 2023-01-25 LAB — PROCALCITONIN: Procalcitonin: <0.1 ng/mL

## 2023-01-25 MED ORDER — HEPARIN BOLUS VIA INFUSION
1500.0000 [IU] | INTRAVENOUS | Status: AC
  Administered 2023-01-25: 1500 [IU] via INTRAVENOUS
  Filled 2023-01-25: qty 1500

## 2023-01-25 MED ORDER — HEPARIN (PORCINE) 25000 UT/250ML-% IV SOLN
2200.0000 [IU]/h | INTRAVENOUS | Status: DC
  Administered 2023-01-25 (×2): 2200 [IU]/h via INTRAVENOUS
  Filled 2023-01-25 (×2): qty 250

## 2023-01-25 MED ORDER — VANCOMYCIN HCL 2000 MG/400ML IV SOLN
2000.0000 mg | INTRAVENOUS | Status: DC
  Administered 2023-01-26: 2000 mg via INTRAVENOUS
  Filled 2023-01-25: qty 400

## 2023-01-25 MED ORDER — INSULIN ASPART 100 UNIT/ML IJ SOLN: 6.0000 [IU] | Freq: Three times a day (TID) | INTRAMUSCULAR | Status: DC

## 2023-01-25 MED ORDER — IOHEXOL 350 MG/ML SOLN
100.0000 mL | Freq: Once | INTRAVENOUS | Status: AC | PRN
  Administered 2023-01-25: 100 mL via INTRAVENOUS

## 2023-01-25 MED ORDER — PROMETHAZINE HCL 25 MG PO TABS
12.5000 mg | ORAL_TABLET | Freq: Four times a day (QID) | ORAL | Status: DC | PRN
  Administered 2023-01-25 – 2023-01-26 (×4): 12.5 mg via ORAL
  Filled 2023-01-25 (×4): qty 1

## 2023-01-25 MED ORDER — ORAL CARE MOUTH RINSE: 15.0000 mL | OROMUCOSAL | Status: DC | PRN

## 2023-01-25 MED ORDER — INSULIN GLARGINE-YFGN 100 UNIT/ML ~~LOC~~ SOLN
50.0000 [IU] | Freq: Every day | SUBCUTANEOUS | Status: DC
  Administered 2023-01-25: 50 [IU] via SUBCUTANEOUS
  Filled 2023-01-25 (×2): qty 0.5

## 2023-01-25 MED ORDER — SODIUM CHLORIDE 0.9% FLUSH: 3.0000 mL | Freq: Two times a day (BID) | INTRAVENOUS | Status: DC

## 2023-01-25 MED ORDER — DIPHENHYDRAMINE HCL 50 MG/ML IJ SOLN
25.0000 mg | Freq: Once | INTRAMUSCULAR | Status: AC
  Administered 2023-01-25: 25 mg via INTRAVENOUS
  Filled 2023-01-25: qty 1

## 2023-01-25 MED ORDER — TRAZODONE HCL 50 MG PO TABS
150.0000 mg | ORAL_TABLET | Freq: Every day | ORAL | Status: DC
  Administered 2023-01-25: 150 mg via ORAL
  Filled 2023-01-25: qty 1

## 2023-01-25 MED ORDER — METOCLOPRAMIDE HCL 5 MG/ML IJ SOLN
10.0000 mg | Freq: Once | INTRAMUSCULAR | Status: AC
  Administered 2023-01-25: 10 mg via INTRAVENOUS
  Filled 2023-01-25: qty 2

## 2023-01-25 MED ORDER — HEPARIN (PORCINE) 25000 UT/250ML-% IV SOLN
2600.0000 [IU]/h | INTRAVENOUS | Status: DC
  Administered 2023-01-25: 2400 [IU]/h via INTRAVENOUS
  Administered 2023-01-26 (×2): 2600 [IU]/h via INTRAVENOUS
  Filled 2023-01-25 (×2): qty 250

## 2023-01-25 MED ORDER — ATORVASTATIN CALCIUM 40 MG PO TABS
40.0000 mg | ORAL_TABLET | Freq: Every day | ORAL | Status: DC
  Administered 2023-01-25: 40 mg via ORAL
  Filled 2023-01-25: qty 1

## 2023-01-25 MED ORDER — HEPARIN BOLUS VIA INFUSION
4000.0000 [IU] | Freq: Once | INTRAVENOUS | Status: AC
  Administered 2023-01-25: 4000 [IU] via INTRAVENOUS
  Filled 2023-01-25: qty 4000

## 2023-01-25 MED ORDER — HYDROMORPHONE HCL 1 MG/ML IJ SOLN
0.5000 mg | Freq: Once | INTRAMUSCULAR | Status: AC
  Administered 2023-01-25: 0.5 mg via INTRAVENOUS
  Filled 2023-01-25: qty 1

## 2023-01-25 MED ORDER — ACETAMINOPHEN 650 MG RE SUPP: 650.0000 mg | Freq: Four times a day (QID) | RECTAL | Status: DC | PRN

## 2023-01-25 MED ORDER — INSULIN ASPART 100 UNIT/ML IJ SOLN: 0.0000 [IU] | Freq: Every day | INTRAMUSCULAR | Status: DC

## 2023-01-25 MED ORDER — OXYCODONE HCL 5 MG PO TABS
15.0000 mg | ORAL_TABLET | Freq: Four times a day (QID) | ORAL | Status: DC | PRN
  Administered 2023-01-25 – 2023-01-26 (×4): 15 mg via ORAL
  Filled 2023-01-25 (×4): qty 3

## 2023-01-25 MED ORDER — PIPERACILLIN-TAZOBACTAM 3.375 G IVPB
3.3750 g | Freq: Three times a day (TID) | INTRAVENOUS | Status: DC
  Administered 2023-01-25 – 2023-01-26 (×3): 3.375 g via INTRAVENOUS
  Filled 2023-01-25 (×5): qty 50

## 2023-01-25 MED ORDER — VANCOMYCIN HCL 2000 MG/400ML IV SOLN
2000.0000 mg | Freq: Once | INTRAVENOUS | Status: DC
  Filled 2023-01-25: qty 400

## 2023-01-25 MED ORDER — INSULIN ASPART 100 UNIT/ML IJ SOLN
0.0000 [IU] | Freq: Three times a day (TID) | INTRAMUSCULAR | Status: DC
  Administered 2023-01-25 (×2): 4 [IU] via SUBCUTANEOUS
  Administered 2023-01-25: 11 [IU] via SUBCUTANEOUS
  Administered 2023-01-26: 7 [IU] via SUBCUTANEOUS
  Administered 2023-01-26: 4 [IU] via SUBCUTANEOUS

## 2023-01-25 MED ORDER — LEVOTHYROXINE SODIUM 50 MCG PO TABS
50.0000 ug | ORAL_TABLET | Freq: Every day | ORAL | Status: DC
  Administered 2023-01-26: 50 ug via ORAL
  Filled 2023-01-25: qty 1

## 2023-01-25 MED ORDER — ACETAMINOPHEN 325 MG PO TABS
650.0000 mg | ORAL_TABLET | Freq: Four times a day (QID) | ORAL | Status: DC | PRN
  Administered 2023-01-25: 650 mg via ORAL
  Filled 2023-01-25: qty 2

## 2023-01-25 MED ORDER — FUROSEMIDE 20 MG PO TABS
20.0000 mg | ORAL_TABLET | ORAL | Status: DC
  Administered 2023-01-26: 20 mg via ORAL
  Filled 2023-01-25: qty 1

## 2023-01-25 MED ORDER — BUPROPION HCL ER (SR) 100 MG PO TB12
100.0000 mg | ORAL_TABLET | Freq: Every morning | ORAL | Status: DC
  Administered 2023-01-25 – 2023-01-26 (×2): 100 mg via ORAL
  Filled 2023-01-25 (×2): qty 1

## 2023-01-25 MED ORDER — PIPERACILLIN-TAZOBACTAM 3.375 G IVPB 30 MIN
3.3750 g | Freq: Once | INTRAVENOUS | Status: AC
  Administered 2023-01-25: 3.375 g via INTRAVENOUS
  Filled 2023-01-25: qty 50

## 2023-01-25 NOTE — Progress Notes (Addendum)
ANTICOAGULATION CONSULT NOTE - Initial Consult  Pharmacy Consult for Heparin (on Xarelto PTA) Indication: new pulmonary embolus  Allergies  Allergen Reactions   Clindamycin Diarrhea and Nausea And Vomiting   Zofran Itching and Nausea And Vomiting   Doxycycline Diarrhea and Nausea And Vomiting   Morphine And Codeine Hives, Itching and Rash    Patient Measurements: Height: 5\' 6"  (167.6 cm) Weight: (!) 186.9 kg (412 lb) IBW/kg (Calculated) : 59.3 Heparin Dosing Weight: 107.5 kg  Vital Signs: Temp: 98.6 F (37 C) (07/04 0115) Temp Source: Oral (07/04 0115) BP: 196/85 (07/04 0115) Pulse Rate: 79 (07/04 0115)  Labs: Recent Labs    01/24/23 2225 01/24/23 2350  HGB 10.9*  --   HCT 34.1*  --   PLT PLATELET CLUMPS NOTED ON SMEAR, UNABLE TO ESTIMATE  --   CREATININE 1.35*  --   TROPONINIHS 13 14    Estimated Creatinine Clearance: 91.6 mL/min (A) (by C-G formula based on SCr of 1.35 mg/dL (H)).   Medical History: Past Medical History:  Diagnosis Date   Anxiety and depression 06/26/2013   Cellulitis    Chronic kidney disease, stage 3a (HCC) 01/11/2023   Diabetes mellitus    Hyperlipidemia associated with type 2 diabetes mellitus (HCC) 05/02/2013   Hypertension associated with diabetes (HCC) 12/27/2020   Hypothyroidism    Infective endocarditis    PE (pulmonary embolism) ~2007-2008   Not on anticoagulation    Medications:  PTA Xarelto 20mg  daily - LD 7/3 @ 0730  Assessment: 47 yr female presents with c/o shortness of breath. Recent hospitalization for heart failure with pleural effusion (6/20-6/25/2024) PMH significant for prior PE (on Xarelto), DM, HTN, s/p right BKA, h/o endocarditis CTAngio = + PE. No evidence of right heart strain Last Xarelto dose taken 7/3 @ 07:30.  Dr Adela Lank confirmed that he would like patient to receive heparin bolus upon initiation of heparin infusion therapy.  Goal of Therapy:  Heparin level 0.3-0.7 units/ml aPTT 66-102 seconds Monitor  platelets by anticoagulation protocol: Yes   Plan:  Obtain baseline aPTT and Heparin level Anticipate heparin level to be falsely elevated due to effects of recent DOAC use.  Will monitor heparin therapy with aPTT until effects of Xarelto have worn off and aPTT and heparin levels correlate Give heparin 4000 unit IV bolus x 1 followed by heparin gtt @ 2200 units/hr  aPTT 6 hr after heparin infusion started Daily heparin level and CBC Monitor for signs & symptoms of bleeding  Anetra Czerwinski, Joselyn Glassman, PharmD 01/25/2023,2:28 AM  ADDENDUM: Baseline levels resulted aPTT 37 (wnl) Heparin level < 0.1 (wnl)  As both aPTT and heparin level are wnl and correlate, will monitor heparin therapy with heparin levels  Terrilee Files, PharmD 01/25/23 @ 05:10

## 2023-01-25 NOTE — ED Provider Notes (Signed)
Received patient in turnover from Dr. Jearld Fenton.  Please see their note for further details of Hx, PE.  Briefly patient is a 47 y.o. female with a Shortness of Breath .  Patient is a 47 year old female with a chief complaint of shortness of breath.  Plan for CT angiogram of the chest and reassessment.  CT angiogram of chest concerning for pulmonary embolism.  I discussed this with the radiologist.  Start on heparin. Nonocclusive without any signs of right heart strain however the patient also has right lower lobe pneumonia.  I feel she is at high risk for MRSA will start on vancomycin and Zosyn.  Will discuss with hospitalist for admission.  CRITICAL CARE Performed by: Rae Roam   Total critical care time: 35 minutes  Critical care time was exclusive of separately billable procedures and treating other patients.  Critical care was necessary to treat or prevent imminent or life-threatening deterioration.  Critical care was time spent personally by me on the following activities: development of treatment plan with patient and/or surrogate as well as nursing, discussions with consultants, evaluation of patient's response to treatment, examination of patient, obtaining history from patient or surrogate, ordering and performing treatments and interventions, ordering and review of laboratory studies, ordering and review of radiographic studies, pulse oximetry and re-evaluation of patient's condition.    Melene Plan, DO 01/25/23 [phone]

## 2023-01-25 NOTE — Progress Notes (Addendum)
ANTICOAGULATION CONSULT NOTE  Pharmacy Consult for Heparin (on Xarelto PTA) Indication: new pulmonary embolus  Allergies  Allergen Reactions   Clindamycin Diarrhea and Nausea And Vomiting   Zofran Itching and Nausea And Vomiting   Doxycycline Diarrhea and Nausea And Vomiting   Morphine And Codeine Hives, Itching and Rash    Patient Measurements: Height: 5\' 6"  (167.6 cm) Weight: (!) 186.9 kg (412 lb) IBW/kg (Calculated) : 59.3 Heparin Dosing Weight: 107.5 kg  Vital Signs: Temp: 98.2 F (36.8 C) (07/04 2120) Temp Source: Oral (07/04 2120) BP: 127/58 (07/04 2120) Pulse Rate: 81 (07/04 2120)  Labs: Recent Labs    01/24/23 2225 01/24/23 2350 01/25/23 0257 01/25/23 1305 01/25/23 2146  HGB 10.9*  --   --   --   --   HCT 34.1*  --   --   --   --   PLT PLATELET CLUMPS NOTED ON SMEAR, UNABLE TO ESTIMATE  --   --   --   --   APTT  --   --  37*  --   --   HEPARINUNFRC  --   --  <0.10* 0.23* 0.29*  CREATININE 1.35*  --   --   --   --   TROPONINIHS 13 14  --   --   --      Estimated Creatinine Clearance: 91.6 mL/min (A) (by C-G formula based on SCr of 1.35 mg/dL (H)).   Medical History: Past Medical History:  Diagnosis Date   Anxiety and depression 06/26/2013   Cellulitis    Chronic kidney disease, stage 3a (HCC) 01/11/2023   Diabetes mellitus    Hyperlipidemia associated with type 2 diabetes mellitus (HCC) 05/02/2013   Hypertension associated with diabetes (HCC) 12/27/2020   Hypothyroidism    Infective endocarditis    PE (pulmonary embolism) ~2007-2008   Not on anticoagulation    Medications:  PTA Xarelto 20mg  daily - LD 7/3 @ 0730  Assessment: 47 yr female presents with c/o shortness of breath. Recent hospitalization for heart failure with pleural effusion (6/20-6/25/2024) PMH significant for prior PE (on Xarelto), DM, HTN, s/p right BKA, h/o endocarditis CTAngio = + PE. No evidence of right heart strain Last Xarelto dose taken 7/3 @ 07:30.  Dr Adela Lank confirmed  that he would like patient to receive heparin bolus upon initiation of heparin infusion therapy.  Today, 01/25/23 aPTT and heparin level are wnl and correlate, will titrate heparin infusion with heparin levels  22:10 heparin level subtherapeutic at 0.29 with IV heparin infusing at 2400 units/hr No interruption in infusion and no bleeding per nurse   Goal of Therapy:  Heparin level 0.3-0.7 units/ml aPTT 66-102 seconds Monitor platelets by anticoagulation protocol: Yes   Plan:  Increase heparin infusion to 2600 units/hr Obtain 6 hour Heparin level Monitor daily heparin level, CBC, signs/symptoms of bleeding   Thank you for allowing pharmacy to be a part of this patient's care.  Junita Push, PharmD, BCPS  Clinical Pharmacist [facility] 01/25/2023 10:19 PM  Addendum: Repeat heparin level on 2600 units/hr now therapeutic at 0.39. No new issues reported by RN Check confirmatory heparin level & CBC at 1030  Junita Push, PharmD, BCPS 01/26/2023@4 :44 AM

## 2023-01-25 NOTE — H&P (Signed)
History and Physical    Patient: Karen Bennett ZOX:[id number] DOB: 25-Aug-1975 DOA: 01/24/2023 DOS: the patient was seen and examined on 01/25/2023 PCP: [name], NP  Patient coming from: Home  Chief Complaint:  Chief Complaint  Patient presents with   Shortness of Breath   HPI: Karen Bennett is a 47 y.o. female with medical history significant of anxiety and depression, chronic kidney disease stage III 3a, diabetes mellitus on insulin, dyslipidemia, hypertension, hypothyroidism, history of prior infective endocarditis, history of PE on Xarelto prior to admission, new diagnosis of HFpEF with associated grade 2 diastolic dysfunction and severe pulmonary hypertension.  Patient was just discharged from the hospital on 6/25 after an admission for acute heart failure.  Patient reports that in regards to her heart failure symptoms she has been stable and has been taking her Lasix as directed.  She presented to the hospital with shortness of breath and dyspnea on exertion x 1 week she also reported to the triage nurse that she was having nausea, chest pain and bilateral rib pain.  She later clarified that the bilateral rib pain has been ongoing for several months.  She apparently continues to drink quite a bit of fluid during the day and in triage presented with a large cup of juice.  Upon presentation to the ED last night patient was afebrile, nontachycardic, BP was 200/95.  Room air sats were 94%, glucose was 244.  Initial labs revealed a sodium of 134, potassium 4.0, normal anion gap, BUN 20 with a creatinine of 1.35, AST 12, BNP 321, troponin normal at 13 and 14, CBC unremarkable although hemoglobin was 10.9, platelets were clumped and they were unable to estimate, hCG beta quant was 9.  Blood cultures have been obtained.  Chest x-ray revealed mild patchy lower lobe opacity suspicious for pneumonia and a small right pleural effusion.  Given her history of prior PE and current presentation and no  signs of heart failure CTA of the chest was performed.  This revealed a nonocclusive pulmonary emboli within the distal right main pulmonary artery extending into the right lower lobe segmental branches with no evidence of right heart strain.  There was also an incidental finding of patchy airspace opacities bilaterally greatest in the right lower lobe concerning for pneumonia although this could also be representative of pulmonary infarct.  She was also found to have moderate right and small left pleural effusion.  Hospitalist service was consulted regarding admission of this patient.   Review of Systems: As mentioned in the history of present illness. All other systems reviewed and are negative.  She did admit to inconsistently taking her Xarelto for multiple months. Past Medical History:  Diagnosis Date   Anxiety and depression 06/26/2013   Cellulitis    Chronic kidney disease, stage 3a (HCC) 01/11/2023   Diabetes mellitus    Hyperlipidemia associated with type 2 diabetes mellitus (HCC) 05/02/2013   Hypertension associated with diabetes (HCC) 12/27/2020   Hypothyroidism    Infective endocarditis    PE (pulmonary embolism) ~2007-2008   Not on anticoagulation   Past Surgical History:  Procedure Laterality Date   Abscess removal from L groin     APPLICATION OF WOUND VAC Left 05/01/2013   Procedure: APPLICATION OF WOUND VAC;  Surgeon: Kerrin Champagne, MD;  Location: [facility];  Service: Orthopedics;  Laterality: Left;   BELOW KNEE LEG AMPUTATION Right    CYSTOSCOPY W/ URETERAL STENT PLACEMENT Right 09/03/2021   Procedure: CYSTOSCOPY WITH RETROGRADE PYELOGRAM/URETERAL STENT  PLACEMENT;  Surgeon: Marcine Matar, MD;  Location: [facility];  Service: Urology;  Laterality: Right;   I & D EXTREMITY  11/12/2011   Procedure: IRRIGATION AND DEBRIDEMENT EXTREMITY;  Surgeon: Kathryne Hitch, MD;  Location: [facility];  Service: Orthopedics;  Laterality: Left;  foot left   I & D EXTREMITY Left 09/06/2012    Procedure: IRRIGATION AND DEBRIDEMENT EXTREMITY;  Surgeon: Eldred Manges, MD;  Location: [facility];  Service: Orthopedics;  Laterality: Left;   I & D EXTREMITY Left 05/01/2013   Procedure: INCISION AND DRAINAGE LEFT FOREFOOT ABCESS ;  Surgeon: Kerrin Champagne, MD;  Location: [facility];  Service: Orthopedics;  Laterality: Left;   INCISION AND DRAINAGE ABSCESS Left 12/27/2020   Procedure: INCISION AND DRAINAGE LEFT THIGH ABSCESS;  Surgeon: Berna Bue, MD;  Location: [facility];  Service: General;  Laterality: Left;   IR FLUORO GUIDE CV LINE RIGHT  09/12/2021   IR PTA VENOUS EXCEPT DIALYSIS CIRCUIT  01/04/2021   IR RADIOLOGIST EVAL & MGMT  10/25/2021   IR US GUIDE VASC ACCESS RIGHT  09/12/2021   IR VENO/EXT/UNI RIGHT  01/04/2021   Surgery to remove hematoma in L leg     TEE WITHOUT CARDIOVERSION N/A 01/03/2021   Procedure: TRANSESOPHAGEAL ECHOCARDIOGRAM (TEE);  Surgeon: Quintella Reichert, MD;  Location: [facility];  Service: Cardiovascular;  Laterality: N/A;   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   Social History:  reports that she has never smoked. She has never used smokeless tobacco. She reports that she does not drink alcohol and does not use drugs.  Allergies  Allergen Reactions   Clindamycin Diarrhea and Nausea And Vomiting   Zofran Itching and Nausea And Vomiting   Doxycycline Diarrhea and Nausea And Vomiting   Morphine And Codeine Hives, Itching and Rash    Family History  Problem Relation Age of Onset   Diabetes Mother    Coronary artery disease Mother    Stroke Mother     Prior to Admission medications   Medication Sig Start Date End Date Taking? Authorizing Provider  acetaminophen (TYLENOL) 500 MG tablet Take 500 mg by mouth every 6 (six) hours as needed for moderate pain.   Yes [provider]  atorvastatin (LIPITOR) 40 MG tablet Take 1 tablet (40 mg total) by mouth daily at 6 PM. 05/12/13  Yes Buriev, Isaiah Serge, MD  BIOTIN PO Take 2 capsules by mouth daily. Unsure  of dose   Yes [provider]  buPROPion ER (WELLBUTRIN SR) 100 MG 12 hr tablet Take 100 mg by mouth every morning. 05/09/21  Yes [provider]  diazepam (VALIUM) 5 MG tablet Take 5 mg by mouth every 8 (eight) hours as needed for anxiety. 03/30/21  Yes [provider]  furosemide (LASIX) 20 MG tablet Take 1 tablet (20 mg total) by mouth every other day. 01/16/23 02/15/23 Yes [place], Eric J, DO  HUMALOG KWIKPEN 100 UNIT/ML KwikPen 0-12 Units 3 (three) times daily. Inject subcutaneously twice daily as per sliding scale: if 70-150= 0 units; 151-200= 2 u; 201-250= 4 u; 251-300= 6 u; 301-350= 8 u; 351-400= 10 u; 401-450= 12u; anything greater than 400 call physician. 10/27/20  Yes [provider]  hydrOXYzine (VISTARIL) 50 MG capsule Take 50 mg by mouth every 8 (eight) hours as needed for itching.   Yes [provider]  insulin glargine (SEMGLEE, YFGN,) 100 UNIT/ML Solostar Pen Inject 50 Units into the skin at bedtime. 03/30/21  Yes [provider]  levothyroxine (SYNTHROID) 50 MCG tablet Take 1 tablet (50 mcg total) by mouth daily at 6 (six) AM. 10/26/21  Yes Leroy Sea, MD  metoCLOPramide (REGLAN) 5 MG tablet Take 1 tablet (5 mg total) by mouth every 6 (six) hours as needed for nausea. 02/09/21  Yes Hollice Espy, MD  oxyCODONE (ROXICODONE) 15 MG immediate release tablet Take 1 tablet (15 mg total) by mouth every 6 (six) hours as needed for pain. 09/13/21  Yes Meredeth Ide, MD  promethazine (PHENERGAN) 25 MG tablet Take 25 mg by mouth 2 (two) times daily as needed for nausea/vomiting or nausea. 01/12/21  Yes [provider]  traZODone (DESYREL) 150 MG tablet Take 1 tablet (150 mg total) by mouth at bedtime. 03/02/22  Yes Lewie Chamber, MD  XARELTO 20 MG TABS tablet Take 20 mg by mouth in the morning. 11/12/20  Yes [provider]  acetaminophen (TYLENOL) 325 MG tablet Take 2 tablets (650 mg total) by mouth every 6 (six) hours as  needed for mild pain or moderate pain. Patient not taking: Reported on 01/25/2023 02/09/21   Hollice Espy, MD  glucose blood test strip Use as instructed 06/26/13   Doris Cheadle, MD  glucose monitoring kit (FREESTYLE) monitoring kit 1 each by Does not apply route as needed for other. 06/26/13   Doris Cheadle, MD    Physical Exam: Vitals:   01/25/23 0115 01/25/23 0400 01/25/23 0507 01/25/23 0534  BP: (!) 196/85 (!) 162/79  (!) 178/76  Pulse: 79 73  81  Resp: (!) 21 18  20   Temp: 98.6 F (37 C)  98.4 F (36.9 C) 98.5 F (36.9 C)  TempSrc: Oral  Oral Oral  SpO2: 94% 92%  95%  Weight:      Height:       Constitutional: NAD, calm, comfortable Respiratory: clear to auscultation bilaterally, no wheezing, no crackles. Normal respiratory effort.  Somewhat diminished in the bilateral bases no accessory muscle use.  Room air Cardiovascular: Regular rate and rhythm, no murmurs / rubs / gallops. No extremity edema. 2+ pedal pulses.  Abdomen: no tenderness, no masses palpated. No obvious hepatosplenomegaly but exam limited by patient's body habitus. Bowel sounds positive.  Musculoskeletal: no clubbing / cyanosis. No joint deformity upper and lower extremities. Good ROM, no contractures. Normal muscle tone.  Skin: no rashes, lesions, ulcers. No induration Neurologic: CN 2-12 grossly intact. Sensation intact, DTR normal. Strength 5/5 x all 4 extremities.  Psychiatric: Normal judgment and insight. Alert and oriented x 3. Normal mood.    Data Reviewed:  As per HPI  Assessment and Plan: Acute right-sided pulmonary embolus/suspected pulmonary infarct History of prior recurrent PE and was on Xarelto but patient admitted to not taking consistently Currently stable on room air at rest and no signs of right heart strain For now continue IV heparin but do not suspect failure of Xarelto itself and likely can transition back to this medication at discharge.  I have counseled patient about the  importance of taking this medication and if necessary to limit this medication out next to her insulin so she can remember to take it regularly She does have severe pulmonary hypertension so unclear if this is rate related to acute PE or other chronic problem (see below)  Bilateral patchy opacities seen on CT of chest Possible pneumonia versus edema but more likely reflective of pulmonary infarct from acute PE As a precaution we will continue antibiotics: Zosyn and vancomycin Check  CRP, procalcitonin and strep pneumonia urinary antigen  Chronic HFpEF/grade 2 diastolic dysfunction/pulmonary hypertension Continue preadmission Lasix Intake and output-check weight as needed Was not on ACE I or ARB prior to admission Does not have systolic dysfunction but still may benefit from GDMT with SGLT-2 medication especially with underlying diabetes 1500 cc fluid restriction  Morbid obesity/suspected OSA and OHS BMI 66 Patient does have severe pulmonary hypertension likely from acute PE but also this could be representative of chronic untreated sleep apnea/OHS Weight reduction strategies per PCP  Diabetes mellitus 2 on insulin Hemoglobin A1c in June reveals an A1c of 10.7 which reveals poorly controlled diabetes Continue preadmission insulin glargine along with meal coverage and sliding scale insulin And that since she is on insulin she would be a candidate for continuous glucose monitor.  Recommend that she follow-up with her PCP since prior authorization paperwork would need to be completed prior to obtaining this prescription. Continue statin for cardio prophylaxis  CKD 3a Serum creatinine at baseline 1.35 with normal BUN, tolerating low-dose Lasix well  Depression and anxiety Continue Wellbutrin and trazodone  Hypothyroidism Continue oral replacement    Advance Care Planning:   Code Status: Full Code   VTE prophylaxis: IV heparin  Consults: None  Family Communication: Patient  only  Severity of Illness: The appropriate patient status for this patient is INPATIENT. Inpatient status is judged to be reasonable and necessary in order to provide the required intensity of service to ensure the patient's safety. The patient's presenting symptoms, physical exam findings, and initial radiographic and laboratory data in the context of their chronic comorbidities is felt to place them at high risk for further clinical deterioration. Furthermore, it is not anticipated that the patient will be medically stable for discharge from the hospital within 2 midnights of admission.   * I certify that at the point of admission it is my clinical judgment that the patient will require inpatient hospital care spanning beyond 2 midnights from the point of admission due to high intensity of service, high risk for further deterioration and high frequency of surveillance required.*  Author: Junious Silk, NP 01/25/2023 6:42 AM  For on call review [email or web address].

## 2023-01-25 NOTE — ED Notes (Signed)
IV team bedside. 

## 2023-01-25 NOTE — ED Notes (Signed)
Pt placed on 2 L [place], b/c O2 dropped to 87% on room air.

## 2023-01-25 NOTE — Progress Notes (Signed)
Pharmacy Antibiotic Note  Karen Bennett is a 47 y.o. female admitted on 01/24/2023 with shortness of breath.  Found to have + pulmonary embolism (patient known to pharmacy from heparin dosing).  Chest Xray suspicious for pneumonia.  In the ED patient ordered Vancomycin 2gm IV and Zosyn 3.375gm IV x 1 dose each.  Pharmacy has been consulted for Vancomycin and Zosyn dosing.  Plan: Zosyn 3.375g IV q8h (4 hour infusion). Vancomycin 2000 mg IV Q 24 hrs. Goal AUC 400-550.  Expected AUC: 472.6  SCr used: 1.35 Daily SCr while on both vancomycin and zosyn F/u culture results and sensitivities  Height: 5\' 6"  (167.6 cm) Weight: (!) 186.9 kg (412 lb) IBW/kg (Calculated) : 59.3  Temp (24hrs), Avg:98.1 F (36.7 C), Min:97 F (36.1 C), Max:98.6 F (37 C)  Recent Labs  Lab 01/24/23 2225  WBC 7.1  CREATININE 1.35*    Estimated Creatinine Clearance: 91.6 mL/min (A) (by C-G formula based on SCr of 1.35 mg/dL (H)).    Allergies  Allergen Reactions   Clindamycin Diarrhea and Nausea And Vomiting   Zofran Itching and Nausea And Vomiting   Doxycycline Diarrhea and Nausea And Vomiting   Morphine And Codeine Hives, Itching and Rash    Antimicrobials this admission: 7/4 Vancomycin >>   7/4 Zosyn >>    Dose adjustments this admission:    Microbiology results: 7/4 BCx:      Thank you for allowing pharmacy to be a part of this patient's care.  Maryellen Pile, PharmD 01/25/2023 6:43 AM

## 2023-01-25 NOTE — Progress Notes (Signed)
ANTICOAGULATION CONSULT NOTE - Initial Consult  Pharmacy Consult for Heparin (on Xarelto PTA) Indication: new pulmonary embolus  Allergies  Allergen Reactions   Clindamycin Diarrhea and Nausea And Vomiting   Zofran Itching and Nausea And Vomiting   Doxycycline Diarrhea and Nausea And Vomiting   Morphine And Codeine Hives, Itching and Rash    Patient Measurements: Height: 5\' 6"  (167.6 cm) Weight: (!) 186.9 kg (412 lb) IBW/kg (Calculated) : 59.3 Heparin Dosing Weight: 107.5 kg  Vital Signs: Temp: 97.6 F (36.4 C) (07/04 0950) Temp Source: Oral (07/04 0950) BP: 161/77 (07/04 0950) Pulse Rate: 81 (07/04 0950)  Labs: Recent Labs    01/24/23 2225 01/24/23 2350 01/25/23 0257 01/25/23 1305  HGB 10.9*  --   --   --   HCT 34.1*  --   --   --   PLT PLATELET CLUMPS NOTED ON SMEAR, UNABLE TO ESTIMATE  --   --   --   APTT  --   --  37*  --   HEPARINUNFRC  --   --  <0.10* 0.23*  CREATININE 1.35*  --   --   --   TROPONINIHS 13 14  --   --      Estimated Creatinine Clearance: 91.6 mL/min (A) (by C-G formula based on SCr of 1.35 mg/dL (H)).   Medical History: Past Medical History:  Diagnosis Date   Anxiety and depression 06/26/2013   Cellulitis    Chronic kidney disease, stage 3a (HCC) 01/11/2023   Diabetes mellitus    Hyperlipidemia associated with type 2 diabetes mellitus (HCC) 05/02/2013   Hypertension associated with diabetes (HCC) 12/27/2020   Hypothyroidism    Infective endocarditis    PE (pulmonary embolism) ~2007-2008   Not on anticoagulation    Medications:  PTA Xarelto 20mg  daily - LD 7/3 @ 0730  Assessment: 47 yr female presents with c/o shortness of breath. Recent hospitalization for heart failure with pleural effusion (6/20-6/25/2024) PMH significant for prior PE (on Xarelto), DM, HTN, s/p right BKA, h/o endocarditis CTAngio = + PE. No evidence of right heart strain Last Xarelto dose taken 7/3 @ 07:30.  Dr Adela Lank confirmed that he would like patient to  receive heparin bolus upon initiation of heparin infusion therapy.  Today, 01/25/23 aPTT and heparin level are wnl and correlate, will titrate heparin infusion with heparin levels  13:05 heparin level subtherapeutic at 0.23 with IV heparin infusing at 2200 units/hr No interruption in infusion and no bleeding per nurse   Goal of Therapy:  Heparin level 0.3-0.7 units/ml aPTT 66-102 seconds Monitor platelets by anticoagulation protocol: Yes   Plan:  Heparin 1500 units IV bolus per infusion and increase heparin infusion to 2400 units/hr Obtain 6 hour Heparin level Monitor daily heparin level, CBC, signs/symptoms of bleeding   Thank you for allowing pharmacy to be a part of this patient's care.  Selinda Eon, PharmD, BCPS Clinical Pharmacist [place] 01/25/2023 2:26 PM

## 2023-01-26 ENCOUNTER — Other Ambulatory Visit (HOSPITAL_COMMUNITY): Payer: Self-pay

## 2023-01-26 ENCOUNTER — Inpatient Hospital Stay (HOSPITAL_COMMUNITY): Payer: 59

## 2023-01-26 DIAGNOSIS — I2699 Other pulmonary embolism without acute cor pulmonale: Secondary | ICD-10-CM | POA: Diagnosis not present

## 2023-01-26 DIAGNOSIS — M7989 Other specified soft tissue disorders: Secondary | ICD-10-CM

## 2023-01-26 DIAGNOSIS — I2693 Single subsegmental pulmonary embolism without acute cor pulmonale: Secondary | ICD-10-CM | POA: Diagnosis not present

## 2023-01-26 LAB — GLUCOSE, CAPILLARY
Glucose-Capillary: 217 mg/dL — ABNORMAL HIGH (ref 70–99)
Glucose-Capillary: 161 mg/dL — ABNORMAL HIGH (ref 70–99)

## 2023-01-26 LAB — BASIC METABOLIC PANEL WITH GFR
Anion gap: 6 (ref 5–15)
BUN: 24 mg/dL — ABNORMAL HIGH (ref 6–20)
CO2: 21 mmol/L — ABNORMAL LOW (ref 22–32)
Calcium: 7.8 mg/dL — ABNORMAL LOW (ref 8.9–10.3)
Chloride: 107 mmol/L (ref 98–111)
Creatinine, Ser: 1.65 mg/dL — ABNORMAL HIGH (ref 0.44–1.00)
GFR, Estimated: 39 mL/min — ABNORMAL LOW
Glucose, Bld: 226 mg/dL — ABNORMAL HIGH (ref 70–99)
Potassium: 3.9 mmol/L (ref 3.5–5.1)
Sodium: 134 mmol/L — ABNORMAL LOW (ref 135–145)

## 2023-01-26 LAB — HEPARIN LEVEL (UNFRACTIONATED): Heparin Unfractionated: 0.36 IU/mL (ref 0.30–0.70)

## 2023-01-26 LAB — CULTURE, BLOOD (ROUTINE X 2)
Culture: NO GROWTH
Special Requests: ADEQUATE

## 2023-01-26 MED ORDER — RIVAROXABAN 20 MG PO TABS: 20.0000 mg | ORAL_TABLET | Freq: Every morning | ORAL | Status: DC

## 2023-01-26 MED ORDER — OXYCODONE HCL 15 MG PO TABS
15.0000 mg | ORAL_TABLET | Freq: Four times a day (QID) | ORAL | 0 refills | Status: DC | PRN
  Filled 2023-01-26: qty 20, 5d supply, fill #0

## 2023-01-26 MED ORDER — RIVAROXABAN (XARELTO) VTE STARTER PACK (15 & 20 MG)
ORAL_TABLET | ORAL | 0 refills | Status: DC
  Filled 2023-01-26: qty 51, 30d supply, fill #0

## 2023-01-26 MED ORDER — RIVAROXABAN 15 MG PO TABS
15.0000 mg | ORAL_TABLET | Freq: Two times a day (BID) | ORAL | Status: DC
  Administered 2023-01-26: 15 mg via ORAL
  Filled 2023-01-26: qty 1

## 2023-01-26 MED ORDER — RIVAROXABAN 20 MG PO TABS: 20.0000 mg | ORAL_TABLET | Freq: Every day | ORAL | Status: DC

## 2023-01-26 MED ORDER — XARELTO 20 MG PO TABS: 20.0000 mg | ORAL_TABLET | Freq: Every morning | ORAL | Status: DC

## 2023-01-26 NOTE — Progress Notes (Signed)
BLE venous duplex (limited - see report) has been completed.   Results can be found under chart review under CV PROC. 01/26/2023 1:40 PM Hollye Pritt RVT, RDMS

## 2023-01-26 NOTE — Discharge Summary (Addendum)
Physician Discharge Summary   Patient: Karen Bennett MRN: [id number] DOB: [birth year]  Admit date:     01/24/2023  Discharge date: 01/26/23  Discharge Physician: Meredeth Ide   PCP: [name], NP   Recommendations at discharge:    Follow-up PCP for refills for Xarelto 20 mg daily to be started after completing Xarelto starter pack Check BMP in 2 weeks  Discharge Diagnoses: Principal Problem:   Acute pulmonary embolism (HCC)  Resolved Problems:   * No resolved hospital problems. *  Hospital Course:  47 year old female with medical history of anxiety, depression, CKD stage IIIa, diabetes mellitus on insulin, dyslipidemia, hypertension, hypothyroidism, history of prior infective endocarditis, history of PE on Xarelto, new diagnosis of HFpEF with grade 2 diastolic dysfunction and severe pulmonary hypertension came to hospital with worsening shortness of breath and dyspnea exertion for 1 week.  Also having bilateral rib pain.  CT chest confirmed nonocclusive pulmonary emboli in the distal right main pulmonary artery extending into right lower lobe segmental branches with no evidence of right heart strain. Patient states that she has been missing her doses of Xarelto intermittently for past 1 month.  Assessment and Plan:  Acute right-sided pulmonary embolus/pulmonary infarct -Did not require oxygen, no right heart strain on CT chest -Patient was noncompliant with Xarelto for past 1 month -Started on IV heparin, switch to Xarelto starter pack -Venous duplex of lower extremity was negative for DVT -Will discharge on Xarelto starter pack -She will need refills for Xarelto 20 mg daily at her PCP office -She has an appointment on 02/09/2023 at PCP office   Patchy opacity seen on CT chest -Initially started on vancomycin and Zosyn -Will discontinue antibiotics, procalcitonin less than 0.10, CRP 0.7 -Patient not requiring oxygen, afebrile  Chronic HFpEF/grade 2 diastolic  dysfunction/pulmonary hypertension Continue preadmission Lasix    Morbid obesity/suspected OSA and OHS BMI 66 Patient does have severe pulmonary hypertension likely from acute PE but also this could be representative of chronic untreated sleep apnea/OHS Weight reduction strategies per PCP   Diabetes mellitus 2 on insulin Hemoglobin A1c in June reveals an A1c of 10.7 which reveals poorly controlled diabetes Continue preadmission insulin glargine along with meal coverage and sliding scale insulin    CKD 3a Serum creatinine is 1.65 today -Patient takes Lasix for diastolic heart failure -This is likely patient's baseline -Follow-up PCP in 2 weeks for BMP check   Depression and anxiety Continue Wellbutrin and trazodone   Hypothyroidism Continue oral replacement       Consultants:  Procedures performed: Venous duplex of lower extremities Disposition: Home Diet recommendation:  Discharge Diet Orders (From admission, onward)     Start     Ordered   01/26/23 0000  Diet - low sodium heart healthy        01/26/23 1429           Carb modified diet DISCHARGE MEDICATION: Allergies as of 01/26/2023       Reactions   Clindamycin Diarrhea, Nausea And Vomiting   Zofran Itching, Nausea And Vomiting   Doxycycline Diarrhea, Nausea And Vomiting   Morphine And Codeine Hives, Itching, Rash        Medication List     TAKE these medications    acetaminophen 500 MG tablet Commonly known as: TYLENOL Take 500 mg by mouth every 6 (six) hours as needed for moderate pain. What changed: Another medication with the same name was removed. Continue taking this medication, and follow the directions you see here.  atorvastatin 40 MG tablet Commonly known as: LIPITOR Take 1 tablet (40 mg total) by mouth daily at 6 PM.   BIOTIN PO Take 2 capsules by mouth daily. Unsure of dose   buPROPion ER 100 MG 12 hr tablet Commonly known as: WELLBUTRIN SR Take 100 mg by mouth every morning.    diazepam 5 MG tablet Commonly known as: VALIUM Take 5 mg by mouth every 8 (eight) hours as needed for anxiety.   furosemide 20 MG tablet Commonly known as: Lasix Take 1 tablet (20 mg total) by mouth every other day.   glucose blood test strip Use as instructed   glucose monitoring kit monitoring kit 1 each by Does not apply route as needed for other.   HumaLOG KwikPen 100 UNIT/ML KwikPen Generic drug: insulin lispro 0-12 Units 3 (three) times daily. Inject subcutaneously twice daily as per sliding scale: if 70-150= 0 units; 151-200= 2 u; 201-250= 4 u; 251-300= 6 u; 301-350= 8 u; 351-400= 10 u; 401-450= 12u; anything greater than 400 call physician.   hydrOXYzine 50 MG capsule Commonly known as: VISTARIL Take 50 mg by mouth every 8 (eight) hours as needed for itching.   levothyroxine 50 MCG tablet Commonly known as: SYNTHROID Take 1 tablet (50 mcg total) by mouth daily at 6 (six) AM.   metoCLOPramide 5 MG tablet Commonly known as: REGLAN Take 1 tablet (5 mg total) by mouth every 6 (six) hours as needed for nausea.   oxyCODONE 15 MG immediate release tablet Commonly known as: ROXICODONE Take 1 tablet (15 mg total) by mouth every 6 (six) hours as needed for pain.   promethazine 25 MG tablet Commonly known as: PHENERGAN Take 25 mg by mouth 2 (two) times daily as needed for nausea/vomiting or nausea.   Semglee (yfgn) 100 UNIT/ML Solostar Pen Generic drug: insulin glargine Inject 50 Units into the skin at bedtime.   traZODone 150 MG tablet Commonly known as: DESYREL Take 1 tablet (150 mg total) by mouth at bedtime.   Xarelto Starter Pack Generic drug: Rivaroxaban Starter Pack (15 mg and 20 mg) Follow package directions: Take one 15mg  tablet by mouth twice a day. On day 22, switch to one 20mg  tablet once a day. Take with food. What changed: You were already taking a medication with the same name, and this prescription was added. Make sure you understand how and when to  take each.   Xarelto 20 MG Tabs tablet Generic drug: rivaroxaban Take 1 tablet (20 mg total) by mouth in the morning. Start after finishing Xarelto started pack What changed: additional instructions        Discharge Exam: Filed Weights   01/24/23 2048  Weight: (!) 186.9 kg   General-appears in no acute distress Heart-S1-S2, regular, no murmur auscultated Lungs-clear to auscultation bilaterally, no wheezing or crackles auscultated Abdomen-soft, nontender, no organomegaly Extremities-no edema in the left lower extremity, s/p right AKA Neuro-alert, oriented x3, no focal deficit noted  Condition at discharge: good  The results of significant diagnostics from this hospitalization (including imaging, microbiology, ancillary and laboratory) are listed below for reference.   Imaging Studies: VAS [place] LOWER EXTREMITY VENOUS (DVT)  Result Date: 01/26/2023  Lower Venous DVT Study Patient Name:  Karen Bennett  Date of Exam:   01/26/2023 Medical Rec #: [id number]        Accession #:    [id number] Date of Birth: 1976/04/14       Patient Gender: F Patient Age:   9 years Exam Location:  [facility] Procedure:      VAS [place] LOWER EXTREMITY VENOUS (DVT) Referring Phys: Sibyl Parr Lawerence Dery --------------------------------------------------------------------------------  Indications: Pulmonary embolism.  Risk Factors: History of PE (2009 & 2022). Anticoagulation: Xarelto - noncompliant. Limitations: Body habitus and poor ultrasound/tissue interface. Comparison Study: Previous exam on 03/11/18 was positive for chronic LLE DVT. Performing Technologist: Ernestene Mention RVT, RDMS  Examination Guidelines: A complete evaluation includes B-mode imaging, spectral Doppler, color Doppler, and power Doppler as needed of all accessible portions of each vessel. Bilateral testing is considered an integral part of a complete examination. Limited examinations for reoccurring indications may be performed as noted. The reflux portion  of the exam is performed with the patient in reverse Trendelenburg.  +--------+---------------+---------+-----------+----------+--------------------+ RIGHT   CompressibilityPhasicitySpontaneityPropertiesThrombus Aging       +--------+---------------+---------+-----------+----------+--------------------+ CFV     Full           Yes      Yes                                       +--------+---------------+---------+-----------+----------+--------------------+ SFJ     Full                                                              +--------+---------------+---------+-----------+----------+--------------------+ FV Prox Full           Yes      Yes                                       +--------+---------------+---------+-----------+----------+--------------------+ FV Mid  Full           Yes      Yes                                       +--------+---------------+---------+-----------+----------+--------------------+ FV                     Yes      Yes                  patent by            Distal                                               color/doppler        +--------+---------------+---------+-----------+----------+--------------------+ PFV     Full                                                              +--------+---------------+---------+-----------+----------+--------------------+ POP     Full           Yes      Yes                                       +--------+---------------+---------+-----------+----------+--------------------+  Peroneal and posterior tibial veins not visualized (BKA and subcutaneous edema).  +--------+---------------+---------+-----------+----------+--------------------+ LEFT    CompressibilityPhasicitySpontaneityPropertiesThrombus Aging       +--------+---------------+---------+-----------+----------+--------------------+ CFV     Full           Yes      Yes                                        +--------+---------------+---------+-----------+----------+--------------------+ SFJ     Full                                                              +--------+---------------+---------+-----------+----------+--------------------+ FV Prox Full                                                              +--------+---------------+---------+-----------+----------+--------------------+ FV Mid                                               Not visualized       +--------+---------------+---------+-----------+----------+--------------------+ FV                     Yes      Yes                  patent by            Distal                                               color/doppler        +--------+---------------+---------+-----------+----------+--------------------+ PFV     Full                                                              +--------+---------------+---------+-----------+----------+--------------------+ POP     Full           Yes      Yes                                       +--------+---------------+---------+-----------+----------+--------------------+ PTV                                                  not visualized       +--------+---------------+---------+-----------+----------+--------------------+ PERO  not visualized       +--------+---------------+---------+-----------+----------+--------------------+ FV distal not well visualized. FV mid, peroneal, and posterior tibial vein not visualized on this exam. Subcutaneous edema through extremity.   Summary: BILATERAL: -No evidence of popliteal cyst, bilaterally. -Subcutaneous edema bilaterally L > R RIGHT: - Portions of this examination were limited- see technologist comments above. - There is no evidence of deep vein thrombosis in the lower extremity. However, portions of this examination were limited- see technologist comments above.   LEFT: - There is no evidence of deep vein thrombosis in the lower extremity. However, portions of this examination were limited- see technologist comments above.  *See table(s) above for measurements and observations.    Preliminary    CT Angio Chest PE W and/or Wo Contrast  Result Date: 01/25/2023 CLINICAL DATA:  Chest and rib pain for a few weeks. Shortness of breath. Recent diagnosis of CHF. EXAM: CT ANGIOGRAPHY CHEST WITH CONTRAST TECHNIQUE: Multidetector CT imaging of the chest was performed using the standard protocol during bolus administration of intravenous contrast. Multiplanar CT image reconstructions and MIPs were obtained to evaluate the vascular anatomy. RADIATION DOSE REDUCTION: This exam was performed according to the departmental dose-optimization program which includes automated exposure control, adjustment of the mA and/or kV according to patient size and/or use of iterative reconstruction technique. CONTRAST:  OMNIPAQUE IOHEXOL 350 MG/ML SOLN COMPARISON:  Chest radiograph 01/24/2023 and CTA chest 02/01/2021 FINDINGS: Cardiovascular: Cardiomegaly. Reflux of contrast into the IVC and hepatic veins compatible with elevated right heart pressures. Extensive coronary artery calcification. Aortic atherosclerotic calcification. Acute nonocclusive pulmonary embolism within the distal right main pulmonary artery extending into the right lower lobe segmental branches. Dilated main pulmonary artery measuring 38 mm. No evidence of right heart strain. No pericardial effusion. Mediastinum/Nodes: Unchanged 22 mm ovoid lesion anterior to the trachea at the thoracic inlet. This is stable since 2009 and could be a lymph node or parathyroid adenoma. Stable prominent mediastinal lymph nodes. Unremarkable trachea and esophagus. Lungs/Pleura: Respiratory motion obscures detail. Mosaic attenuation compatible with air trapping. Patchy airspace opacities bilaterally greatest in the right lower lobe. Moderate  right and small left pleural effusions. No pneumothorax. Upper Abdomen: No acute abnormality. Musculoskeletal: No acute abnormality. Review of the MIP images confirms the above findings. IMPRESSION: 1. Nonocclusive pulmonary embolism within the distal right main pulmonary artery extending into the right lower lobe segmental branches. No evidence of right heart strain. 2. Patchy airspace opacities bilaterally greatest in the right lower lobe, concerning for pneumonia. 3. Moderate right and small left pleural effusions. 4. Cardiomegaly with evidence of elevated right heart pressures. 5. Dilated main pulmonary artery, as can be seen in pulmonary hypertension. 6. Extensive coronary artery calcification, greater than expected for age. Aortic Atherosclerosis (ICD10-I70.0). Critical Value/emergent results were called by telephone at the time of interpretation on 01/25/2023 at 1:36 am to provider Dr. Adela Lank, who verbally acknowledged these results. Electronically Signed   By: Minerva Fester M.D.   On: 01/25/2023 01:38   DG Chest 2 View  Result Date: 01/24/2023 CLINICAL DATA:  Shortness of breath EXAM: CHEST - 2 VIEW COMPARISON:  01/11/2023 FINDINGS: Mild patchy right lower lobe opacity, suspicious for pneumonia. Small right pleural effusion. No pneumothorax. The heart is normal in size. IMPRESSION: Mild patchy right lower lobe opacity, suspicious for pneumonia. Small right pleural effusion. Electronically Signed   By: Charline Bills M.D.   On: 01/24/2023 21:41   ECHOCARDIOGRAM COMPLETE  Result Date: 01/12/2023    ECHOCARDIOGRAM REPORT  Patient Name:   Karen Bennett Date of Exam: 01/12/2023 Medical Rec #:  [id number]       Height:       66.0 in Accession #:    [id number]      Weight:       423.3 lb Date of Birth:  12/12/1975      BSA:          2.750 m Patient Age:    46 years        BP:           125/57 mmHg Patient Gender: F               HR:           56 bpm. Exam Location:  Inpatient Procedure: 2D Echo, Cardiac  Doppler, Color Doppler and Intracardiac            Opacification Agent Indications:    CHF Acute Diastolic  History:        Patient has prior history of Echocardiogram examinations, most                 recent 02/22/2022. Endocarditis, Signs/Symptoms:Shortness of                 Breath; Risk Factors:Diabetes, Hypertension and Dyslipidemia.                 CKD.  Sonographer:    Milda Smart Referring Phys: [id number] VISHAL R PATEL  Sonographer Comments: Image acquisition challenging due to patient body habitus and Image acquisition challenging due to respiratory motion. Pt scanned supine due to pt unable to turn. IMPRESSIONS  1. Left ventricular ejection fraction, by estimation, is 50 to 55%. The left ventricle has low normal function. The left ventricle has no regional wall motion abnormalities. Left ventricular diastolic parameters are consistent with Grade II diastolic dysfunction (pseudonormalization). There is abnormal septal motion and the interventricular septum is flattened in systole, consistent with right ventricular pressure overload.  2. Right ventricular systolic function is normal. The right ventricular size is mildly enlarged. There is severely elevated pulmonary artery systolic pressure. The estimated right ventricular systolic pressure is 62.9 mmHg.  3. The mitral valve is abnormal. Mild to moderate mitral valve regurgitation. No evidence of mitral stenosis.  4. The aortic valve is normal in structure. Aortic valve regurgitation is not visualized. No aortic stenosis is present.  5. The inferior vena cava is dilated in size with <50% respiratory variability, suggesting right atrial pressure of 15 mmHg. FINDINGS  Left Ventricle: Left ventricular ejection fraction, by estimation, is 50 to 55%. The left ventricle has low normal function. The left ventricle has no regional wall motion abnormalities. Definity contrast agent was given IV to delineate the left ventricular endocardial borders. The left  ventricular internal cavity size was normal in size. There is no left ventricular hypertrophy. Abnormal septal motion and the interventricular septum is flattened in systole, consistent with right ventricular pressure overload. Left ventricular diastolic parameters are consistent with Grade II diastolic dysfunction (pseudonormalization). Right Ventricle: The right ventricular size is mildly enlarged. No increase in right ventricular wall thickness. Right ventricular systolic function is normal. There is severely elevated pulmonary artery systolic pressure. The tricuspid regurgitant velocity is 3.46 m/s, and with an assumed right atrial pressure of 15 mmHg, the estimated right ventricular systolic pressure is 62.9 mmHg. Left Atrium: Left atrial size was normal in size. Right Atrium: Right atrial size was normal in size. Pericardium: There is no  evidence of pericardial effusion. Mitral Valve: The mitral valve is abnormal. There is mild calcification of the mitral valve leaflet(s). Mild to moderate mitral valve regurgitation. No evidence of mitral valve stenosis. MV peak gradient, 11.2 mmHg. The mean mitral valve gradient is 3.0 mmHg. Tricuspid Valve: The tricuspid valve is normal in structure. Tricuspid valve regurgitation is trivial. No evidence of tricuspid stenosis. Aortic Valve: The aortic valve is normal in structure. Aortic valve regurgitation is not visualized. No aortic stenosis is present. Pulmonic Valve: The pulmonic valve was normal in structure. Pulmonic valve regurgitation is mild. No evidence of pulmonic stenosis. Aorta: The aortic root is normal in size and structure. Venous: The inferior vena cava is dilated in size with less than 50% respiratory variability, suggesting right atrial pressure of 15 mmHg. IAS/Shunts: No atrial level shunt detected by color flow Doppler.  LEFT VENTRICLE PLAX 2D LVIDd:         5.30 cm   Diastology LVIDs:         3.90 cm   LV e' medial:    4.80 cm/s LV PW:         0.80 cm    LV E/e' medial:  26.5 LV IVS:        0.80 cm   LV e' lateral:   6.38 cm/s LVOT diam:     2.00 cm   LV E/e' lateral: 19.9 LV SV:         85 LV SV Index:   31 LVOT Area:     3.14 cm  RIGHT VENTRICLE            IVC RV S prime:     9.08 cm/s  IVC diam: 2.40 cm TAPSE (M-mode): 1.8 cm LEFT ATRIUM             Index        RIGHT ATRIUM           Index LA diam:        4.50 cm 1.64 cm/m   RA Area:     14.30 cm LA Vol (A2C):   56.8 ml 20.65 ml/m  RA Volume:   37.40 ml  13.60 ml/m LA Vol (A4C):   48.6 ml 17.67 ml/m LA Biplane Vol: 54.0 ml 19.63 ml/m  AORTIC VALVE LVOT Vmax:   126.00 cm/s LVOT Vmean:  86.400 cm/s LVOT VTI:    0.272 m  AORTA Ao Root diam: 2.80 cm Ao Asc diam:  3.40 cm MITRAL VALVE                TRICUSPID VALVE MV Area (PHT): 2.73 cm     TR Peak grad:   47.9 mmHg MV Area VTI:   1.55 cm     TR Mean grad:   28.0 mmHg MV Peak grad:  11.2 mmHg    TR Vmax:        346.00 cm/s MV Mean grad:  3.0 mmHg     TR Vmean:       253.0 cm/s MV Vmax:       1.67 m/s MV Vmean:      72.8 cm/s    SHUNTS MV Decel Time: 278 msec     Systemic VTI:  0.27 m MR Peak grad: 74.3 mmHg     Systemic Diam: 2.00 cm MR Mean grad: 51.0 mmHg MR Vmax:      431.00 cm/s MR Vmean:     341.0 cm/s MV E velocity: 127.00 cm/s MV A velocity: 79.60 cm/s MV  E/A ratio:  1.60 Weston Brass MD Electronically signed by Weston Brass MD Signature Date/Time: 01/12/2023/5:19:43 PM    Final    DG Chest 2 View  Result Date: 01/11/2023 CLINICAL DATA:  COPD EXAM: CHEST - 2 VIEW COMPARISON:  CXR 10/24/21 FINDINGS: Cardiomegaly. Hazy opacity at the right lung base could represent a combination of a layering pleural effusion and atelectasis or infection. No radiographically apparent displaced fractures. Visualized upper abdomen is unremarkable. Vertebral body heights are maintained. IMPRESSION: Cardiomegaly with a layering right pleural effusion and a superimposed airspace opacity that could represent atelectasis or infection. Electronically Signed   By: Lorenza Cambridge M.D.   On: 01/11/2023 15:41    Microbiology: Results for orders placed or performed during the hospital encounter of 01/24/23  Blood culture (routine x 2)     Status: None (Preliminary result)   Collection Time: 01/25/23  2:57 AM   Specimen: BLOOD RIGHT ARM  Result Value Ref Range Status   Specimen Description   Final    BLOOD RIGHT ARM Performed at [facility], [address] [place], [place] [address]    Special Requests   Final    BOTTLES DRAWN AEROBIC AND ANAEROBIC Blood Culture adequate volume Performed at [facility], [address] [place], [place] [address]    Culture   Final    NO GROWTH 1 DAY Performed at [facility], [address], [place], [place] [address]    Report Status PENDING  Incomplete  Blood culture (routine x 2)     Status: None (Preliminary result)   Collection Time: 01/25/23  3:40 AM   Specimen: BLOOD LEFT ARM  Result Value Ref Range Status   Specimen Description   Final    BLOOD LEFT ARM Performed at [facility], [address], [place], [place] [address]    Special Requests   Final    BOTTLES DRAWN AEROBIC ONLY Blood Culture results may not be optimal due to an inadequate volume of blood received in culture bottles Performed at [facility], [address], [place], [place] [address]    Culture   Final    NO GROWTH 1 DAY Performed at [facility], [address], [place], [place] [address]    Report Status PENDING  Incomplete    Labs: CBC: Recent Labs  Lab 01/24/23 2225  WBC 7.1  NEUTROABS 4.4  HGB 10.9*  HCT 34.1*  MCV 91.2  PLT PLATELET CLUMPS NOTED ON SMEAR, UNABLE TO ESTIMATE   Basic Metabolic Panel: Recent Labs  Lab 01/24/23 2225 01/26/23 0407  NA 134* 134*  K 4.0 3.9  CL 105 107  CO2 22 21*  GLUCOSE 244* 226*  BUN 20 24*  CREATININE 1.35* 1.65*  CALCIUM 8.3* 7.8*   Liver Function Tests: Recent Labs  Lab 01/24/23 2225  AST 12*  ALT 8   ALKPHOS 58  BILITOT 0.7  PROT 6.8  ALBUMIN 2.2*   CBG: Recent Labs  Lab 01/25/23 1125 01/25/23 1547 01/25/23 2122 01/26/23 0807 01/26/23 1142  GLUCAP 158* 154* 182* 217* 161*    Discharge time spent: greater than 30 minutes.  Signed: Meredeth Ide, MD [facility] 01/26/2023

## 2023-01-26 NOTE — TOC Initial Note (Addendum)
Transition of Care [facility]) - Initial/Assessment Note    Patient Details  Name: Karen Bennett MRN: [id number] Date of Birth: 05/22/1976  Transition of Care [facility]) CM/SW Contact:    Adrian Prows, RN Phone Number: 01/26/2023, 3:38 PM  Clinical Narrative:                 [facility] consult for d/c planning; spoke w/ pt in room' pt says she is from home and plans to return at d/c; she identified POC Jarold Song [phone]); pt denies experiencing IPV, difficulty paying utilities, and food/housing insecurity; pt has 3-n-1, wheel chair, HH Aide thru [facility]; she does not have home oxygen, glasses, dentures; pt will need transport home by [facility]; she verified d/c address [address], [address] [place] [address];  pt requested bariatric stretcher; transport called at 1550; spoke w/ Pearn; Pearn was also notified that pt requested bariatric stretcher; no TOC needs.  Expected Discharge Plan: Home w Home Health Services Barriers to Discharge: No Barriers Identified   Patient Goals and CMS Choice Patient states their goals for this hospitalization and ongoing recovery are:: home          Expected Discharge Plan and Services   Discharge Planning Services: CM Consult Post Acute Care Choice: Resumption of Svcs/PTA Provider, Durable Medical Equipment (3-n-1, wheel chair, HH Aide thru [facility]) Living arrangements for the past 2 months: Apartment Expected Discharge Date: 01/26/23               DME Arranged: N/A DME Agency: NA       HH Arranged: NA HH Agency: NA        Prior Living Arrangements/Services Living arrangements for the past 2 months: Apartment Lives with:: Self Patient language and need for interpreter reviewed:: Yes Do you feel safe going back to the place where you live?: Yes      Need for Family Participation in Patient Care: Yes (Comment) Care giver support system in place?: Yes (comment) Current home services: DME, Homehealth aide (3-n-1,  wheel chair, HH Aide thru [facility]) Criminal Activity/Legal Involvement Pertinent to Current Situation/Hospitalization: No - Comment as needed  Activities of Daily Living Home Assistive Devices/Equipment: Wheelchair ADL Screening (condition at time of admission) Patient's cognitive ability adequate to safely complete daily activities?: Yes Is the patient deaf or have difficulty hearing?: No Does the patient have difficulty seeing, even when wearing glasses/contacts?: No Does the patient have difficulty concentrating, remembering, or making decisions?: No Patient able to express need for assistance with ADLs?: Yes Does the patient have difficulty dressing or bathing?: Yes Independently performs ADLs?: Yes (appropriate for developmental age) Does the patient have difficulty walking or climbing stairs?: Yes Weakness of Legs: Left (Right BKA) Weakness of Arms/Hands: Both  Permission Sought/Granted Permission sought to share information with : Case Manager Permission granted to share information with : Yes, Verbal Permission Granted  Share Information with NAME: Case Manager     Permission granted to share info w Relationship: Jarold Song (friend) [phone]     Emotional Assessment Appearance:: Appears stated age Attitude/Demeanor/Rapport: Gracious Affect (typically observed): Accepting Orientation: : Oriented to Self, Oriented to Place, Oriented to  Time, Oriented to Situation Alcohol / Substance Use: Not Applicable Psych Involvement: No (comment)  Admission diagnosis:  Acute pulmonary embolism (HCC) [I26.99] HCAP (healthcare-associated pneumonia) [J18.9] Single subsegmental pulmonary embolism without acute cor pulmonale (HCC) [I26.93] Patient Active Problem List   Diagnosis Date Noted   Acute pulmonary embolism (HCC) 01/25/2023  Acute on chronic heart failure with preserved ejection fraction (HFpEF) (HCC) 01/11/2023   Chronic kidney disease, stage 3a (HCC)  01/11/2023   Acute pain of right lower extremity 07/04/2022   History of femur fracture 02/23/2022   Bacteremia 02/22/2022   Hypothyroidism    Hyperglycemia 10/24/2021   Hx of bacterial endocarditis 10/24/2021   Pyuria 10/24/2021   Candida rash of groin 09/11/2021   Pressure injury of back, stage 2 (HCC) 09/05/2021   Obstruction of right ureteropelvic junction (UPJ) due to stone 09/04/2021   Sepsis secondary to UTI (HCC) 09/03/2021   Diabetic hyperosmolar non-ketotic state (HCC) 09/03/2021   Stenosis of subclavian artery (HCC) 02/09/2021   Sepsis (HCC) 02/01/2021   Mixed hyperlipidemia 01/12/2021   Infective endocarditis    Pressure injury of skin 12/28/2020   BMI 60.0-69.9, adult (HCC) 12/27/2020   Hypertension associated with diabetes (HCC) 12/27/2020   History of pulmonary embolism 12/27/2020   Necrotizing fasciitis of lower leg (HCC) Thigh  12/27/2020   Cellulitis 12/26/2020   History of DVT (deep vein thrombosis) 11/12/2020   Tobacco abuse 12/31/2017   Hx of right BKA (HCC) 12/31/2017   Closed fracture of right distal femur (HCC) 12/31/2017   Hirsutism 06/21/2016   Noncompliance with treatment plan 04/19/2015   Anxiety and depression 06/26/2013   Normocytic anemia 05/22/2013   Depression 05/22/2013   Grief reaction 05/22/2013   AKI (acute kidney injury) (HCC) 05/07/2013   Phantom limb pain (HCC) 05/06/2013   Hyperlipidemia associated with type 2 diabetes mellitus (HCC) 05/02/2013   Diabetic peripheral neuropathy associated with type 2 diabetes mellitus (HCC) 04/30/2013    Class: Chronic   MSSA (methicillin susceptible Staphylococcus aureus) 09/06/2012   Foot osteomyelitis, left (HCC) 09/03/2012   Pulmonary emboli (HCC) 11/11/2011   Hyponatremia 09/05/2011   Diabetic foot ulcer (HCC) 09/05/2011   Obesity, Class III, BMI 40-49.9 (morbid obesity) (HCC) 08/15/2011   Diabetes mellitus type 2, insulin dependent (HCC) 06/12/2007   PCP:  [name], NP Pharmacy:    [id number] - [place], [place] - [address] AT [facility] [address] [place] [address] Phone: [phone] Fax: [phone]  [facility] [address] [place] [address] Phone: [phone] Fax: [phone]     Social Determinants of Health (SDOH) Social History: SDOH Screenings   Food Insecurity: No Food Insecurity (01/26/2023)  Housing: Patient Declined (01/26/2023)  Transportation Needs: No Transportation Needs (01/26/2023)  Utilities: Not At Risk (01/26/2023)  Tobacco Use: Low Risk  (01/25/2023)   SDOH Interventions: Food Insecurity Interventions: Intervention Not Indicated, Inpatient TOC Housing Interventions: Intervention Not Indicated, Inpatient TOC Transportation Interventions: Intervention Not Indicated, Inpatient TOC Utilities Interventions: Intervention Not Indicated, Inpatient TOC   Readmission Risk Interventions    01/26/2023    3:33 PM 01/16/2023   11:06 AM 02/23/2022   10:38 AM  Readmission Risk Prevention Plan  Transportation Screening Complete Complete Complete  PCP or Specialist Appt within 5-7 Days  Complete   PCP or Specialist Appt within 3-5 Days Complete    Home Care Screening  Complete   Medication Review (RN CM)  Complete   HRI or Home Care Consult Complete  Complete  Social Work Consult for Recovery Care Planning/Counseling Complete  Complete  Palliative Care Screening Not Applicable  Not Applicable  Medication Review Oceanographer) Complete  Complete

## 2023-01-26 NOTE — Discharge Instructions (Addendum)
Information on my medicine - XARELTO (rivaroxaban)  This medication education was reviewed with me or my healthcare representative as part of my discharge preparation.  The pharmacist that spoke with me during my hospital stay was:  Pasty Spillers, [facility]  WHY WAS Karen Bennett PRESCRIBED FOR YOU? Xarelto was prescribed to treat blood clots that may have been found in the veins of your legs (deep vein thrombosis) or in your lungs (pulmonary embolism) and to reduce the risk of them occurring again.  What do you need to know about Xarelto? The starting dose is one 15 mg tablet taken TWICE daily with food for the FIRST 21 DAYS then on (enter date)  02/16/23  the dose is changed to one 20 mg tablet taken ONCE A DAY with your evening meal.  DO NOT stop taking Xarelto without talking to the health care provider who prescribed the medication.  Refill your prescription for 20 mg tablets before you run out.  After discharge, you should have regular check-up appointments with your healthcare provider that is prescribing your Xarelto.  In the future your dose may need to be changed if your kidney function changes by a significant amount.  What do you do if you miss a dose? If you are taking Xarelto TWICE DAILY and you miss a dose, take it as soon as you remember. You may take two 15 mg tablets (total 30 mg) at the same time then resume your regularly scheduled 15 mg twice daily the next day.  If you are taking Xarelto ONCE DAILY and you miss a dose, take it as soon as you remember on the same day then continue your regularly scheduled once daily regimen the next day. Do not take two doses of Xarelto at the same time.   Important Safety Information Xarelto is a blood thinner medicine that can cause bleeding. You should call your healthcare provider right away if you experience any of the following: Bleeding from an injury or your nose that does not stop. Unusual colored urine (red or dark  brown) or unusual colored stools (red or black). Unusual bruising for unknown reasons. A serious fall or if you hit your head (even if there is no bleeding).  Some medicines may interact with Xarelto and might increase your risk of bleeding while on Xarelto. To help avoid this, consult your healthcare provider or pharmacist prior to using any new prescription or non-prescription medications, including herbals, vitamins, non-steroidal anti-inflammatory drugs (NSAIDs) and supplements.  This website has more information on Xarelto: VisitDestination.com.br.

## 2023-01-26 NOTE — TOC Benefit Eligibility Note (Signed)
Pharmacy Patient Advocate Encounter  Insurance verification completed.    The patient is insured through [employer] test claim for Xarelto and the current 30 day co-pay is $0.00.   This test claim was processed through [facility]- copay amounts may vary at other pharmacies due to pharmacy/plan contracts, or as the patient moves through the different stages of their insurance plan.

## 2023-01-27 LAB — CULTURE, BLOOD (ROUTINE X 2): Culture: NO GROWTH

## 2023-01-28 LAB — CULTURE, BLOOD (ROUTINE X 2)

## 2023-01-29 LAB — CULTURE, BLOOD (ROUTINE X 2)

## 2023-01-30 LAB — CULTURE, BLOOD (ROUTINE X 2)

## 2023-02-09 ENCOUNTER — Emergency Department (HOSPITAL_COMMUNITY): Payer: 59

## 2023-02-09 ENCOUNTER — Other Ambulatory Visit: Payer: Self-pay

## 2023-02-09 ENCOUNTER — Observation Stay (HOSPITAL_COMMUNITY): Payer: 59

## 2023-02-09 ENCOUNTER — Inpatient Hospital Stay (HOSPITAL_COMMUNITY)
Admission: EM | Admit: 2023-02-09 | Discharge: 2023-02-23 | DRG: 918 | Disposition: A | Discharge status: Home / Self Care | Payer: 59 | Attending: Family Medicine | Admitting: Family Medicine

## 2023-02-09 ENCOUNTER — Encounter (HOSPITAL_COMMUNITY): Payer: Self-pay

## 2023-02-09 DIAGNOSIS — N189 Chronic kidney disease, unspecified: Secondary | ICD-10-CM

## 2023-02-09 DIAGNOSIS — Z7901 Long term (current) use of anticoagulants: Secondary | ICD-10-CM

## 2023-02-09 DIAGNOSIS — E11649 Type 2 diabetes mellitus with hypoglycemia without coma: Secondary | ICD-10-CM | POA: Diagnosis not present

## 2023-02-09 DIAGNOSIS — I452 Bifascicular block: Secondary | ICD-10-CM | POA: Diagnosis present

## 2023-02-09 DIAGNOSIS — Z6841 Body Mass Index (BMI) 40.0 and over, adult: Secondary | ICD-10-CM

## 2023-02-09 DIAGNOSIS — E785 Hyperlipidemia, unspecified: Secondary | ICD-10-CM | POA: Diagnosis present

## 2023-02-09 DIAGNOSIS — Z8249 Family history of ischemic heart disease and other diseases of the circulatory system: Secondary | ICD-10-CM

## 2023-02-09 DIAGNOSIS — Z794 Long term (current) use of insulin: Secondary | ICD-10-CM

## 2023-02-09 DIAGNOSIS — M48061 Spinal stenosis, lumbar region without neurogenic claudication: Secondary | ICD-10-CM | POA: Diagnosis present

## 2023-02-09 DIAGNOSIS — E039 Hypothyroidism, unspecified: Secondary | ICD-10-CM | POA: Diagnosis present

## 2023-02-09 DIAGNOSIS — J9811 Atelectasis: Secondary | ICD-10-CM | POA: Diagnosis present

## 2023-02-09 DIAGNOSIS — Z881 Allergy status to other antibiotic agents status: Secondary | ICD-10-CM

## 2023-02-09 DIAGNOSIS — F32A Depression, unspecified: Secondary | ICD-10-CM | POA: Diagnosis present

## 2023-02-09 DIAGNOSIS — G8220 Paraplegia, unspecified: Secondary | ICD-10-CM | POA: Diagnosis not present

## 2023-02-09 DIAGNOSIS — M546 Pain in thoracic spine: Secondary | ICD-10-CM | POA: Diagnosis present

## 2023-02-09 DIAGNOSIS — Z79890 Hormone replacement therapy (postmenopausal): Secondary | ICD-10-CM

## 2023-02-09 DIAGNOSIS — T503X1A Poisoning by electrolytic, caloric and water-balance agents, accidental (unintentional), initial encounter: Secondary | ICD-10-CM | POA: Diagnosis not present

## 2023-02-09 DIAGNOSIS — M5416 Radiculopathy, lumbar region: Secondary | ICD-10-CM | POA: Diagnosis present

## 2023-02-09 DIAGNOSIS — Z79899 Other long term (current) drug therapy: Secondary | ICD-10-CM

## 2023-02-09 DIAGNOSIS — Z993 Dependence on wheelchair: Secondary | ICD-10-CM

## 2023-02-09 DIAGNOSIS — D631 Anemia in chronic kidney disease: Secondary | ICD-10-CM | POA: Diagnosis present

## 2023-02-09 DIAGNOSIS — Z86711 Personal history of pulmonary embolism: Secondary | ICD-10-CM

## 2023-02-09 DIAGNOSIS — R0602 Shortness of breath: Secondary | ICD-10-CM

## 2023-02-09 DIAGNOSIS — I152 Hypertension secondary to endocrine disorders: Secondary | ICD-10-CM | POA: Diagnosis present

## 2023-02-09 DIAGNOSIS — F419 Anxiety disorder, unspecified: Secondary | ICD-10-CM | POA: Diagnosis present

## 2023-02-09 DIAGNOSIS — R600 Localized edema: Principal | ICD-10-CM

## 2023-02-09 DIAGNOSIS — E1122 Type 2 diabetes mellitus with diabetic chronic kidney disease: Secondary | ICD-10-CM | POA: Diagnosis present

## 2023-02-09 DIAGNOSIS — N1831 Chronic kidney disease, stage 3a: Secondary | ICD-10-CM | POA: Diagnosis present

## 2023-02-09 DIAGNOSIS — E1142 Type 2 diabetes mellitus with diabetic polyneuropathy: Secondary | ICD-10-CM | POA: Diagnosis present

## 2023-02-09 DIAGNOSIS — I2699 Other pulmonary embolism without acute cor pulmonale: Secondary | ICD-10-CM

## 2023-02-09 DIAGNOSIS — Z833 Family history of diabetes mellitus: Secondary | ICD-10-CM

## 2023-02-09 DIAGNOSIS — E1165 Type 2 diabetes mellitus with hyperglycemia: Secondary | ICD-10-CM | POA: Diagnosis present

## 2023-02-09 DIAGNOSIS — N179 Acute kidney failure, unspecified: Secondary | ICD-10-CM | POA: Diagnosis present

## 2023-02-09 DIAGNOSIS — Y92009 Unspecified place in unspecified non-institutional (private) residence as the place of occurrence of the external cause: Secondary | ICD-10-CM

## 2023-02-09 DIAGNOSIS — Z823 Family history of stroke: Secondary | ICD-10-CM

## 2023-02-09 DIAGNOSIS — E8770 Fluid overload, unspecified: Secondary | ICD-10-CM | POA: Diagnosis present

## 2023-02-09 DIAGNOSIS — M4805 Spinal stenosis, thoracolumbar region: Secondary | ICD-10-CM | POA: Diagnosis present

## 2023-02-09 DIAGNOSIS — Z89511 Acquired absence of right leg below knee: Secondary | ICD-10-CM

## 2023-02-09 DIAGNOSIS — D649 Anemia, unspecified: Secondary | ICD-10-CM | POA: Diagnosis present

## 2023-02-09 DIAGNOSIS — Z888 Allergy status to other drugs, medicaments and biological substances status: Secondary | ICD-10-CM

## 2023-02-09 DIAGNOSIS — N184 Chronic kidney disease, stage 4 (severe): Secondary | ICD-10-CM | POA: Diagnosis present

## 2023-02-09 LAB — CBC WITH DIFFERENTIAL/PLATELET
Abs Immature Granulocytes: 0.02 10*3/uL (ref 0.00–0.07)
Basophils Absolute: 0 10*3/uL (ref 0.0–0.1)
Basophils Relative: 1 %
Eosinophils Absolute: 0.1 10*3/uL (ref 0.0–0.5)
Eosinophils Relative: 2 %
HCT: 37 % (ref 36.0–46.0)
Hemoglobin: 11.8 g/dL — ABNORMAL LOW (ref 12.0–15.0)
Immature Granulocytes: 0 %
Lymphocytes Relative: 22 %
Lymphs Abs: 1.4 10*3/uL (ref 0.7–4.0)
MCH: 28.2 pg (ref 26.0–34.0)
MCHC: 31.9 g/dL (ref 30.0–36.0)
MCV: 88.5 fL (ref 80.0–100.0)
Monocytes Absolute: 0.4 10*3/uL (ref 0.1–1.0)
Monocytes Relative: 6 %
Neutro Abs: 4.4 10*3/uL (ref 1.7–7.7)
Neutrophils Relative %: 69 %
Platelets: 298 10*3/uL (ref 150–400)
RBC: 4.18 MIL/uL (ref 3.87–5.11)
RDW: 12.9 % (ref 11.5–15.5)
WBC: 6.4 10*3/uL (ref 4.0–10.5)
nRBC: 0 % (ref 0.0–0.2)

## 2023-02-09 LAB — COMPREHENSIVE METABOLIC PANEL
ALT: 8 U/L (ref 0–44)
AST: 14 U/L — ABNORMAL LOW (ref 15–41)
Albumin: 2.1 g/dL — ABNORMAL LOW (ref 3.5–5.0)
Alkaline Phosphatase: 57 U/L (ref 38–126)
Anion gap: 7 (ref 5–15)
BUN: 16 mg/dL (ref 6–20)
CO2: 22 mmol/L (ref 22–32)
Calcium: 8.3 mg/dL — ABNORMAL LOW (ref 8.9–10.3)
Chloride: 102 mmol/L (ref 98–111)
Creatinine, Ser: 1.16 mg/dL — ABNORMAL HIGH (ref 0.44–1.00)
GFR, Estimated: 59 mL/min — ABNORMAL LOW (ref >60)
Glucose, Bld: 197 mg/dL — ABNORMAL HIGH (ref 70–99)
Potassium: 4.3 mmol/L (ref 3.5–5.1)
Sodium: 131 mmol/L — ABNORMAL LOW (ref 135–145)
Total Bilirubin: 1 mg/dL (ref 0.3–1.2)
Total Protein: 6.5 g/dL (ref 6.5–8.1)

## 2023-02-09 LAB — TROPONIN I (HIGH SENSITIVITY)
Troponin I (High Sensitivity): 8 ng/L (ref <18)
Troponin I (High Sensitivity): 8 ng/L (ref <18)

## 2023-02-09 LAB — GLUCOSE, CAPILLARY: Glucose-Capillary: 195 mg/dL — ABNORMAL HIGH (ref 70–99)

## 2023-02-09 LAB — BRAIN NATRIURETIC PEPTIDE: B Natriuretic Peptide: 345.1 pg/mL — ABNORMAL HIGH (ref 0.0–100.0)

## 2023-02-09 LAB — CBG MONITORING, ED: Glucose-Capillary: 171 mg/dL — ABNORMAL HIGH (ref 70–99)

## 2023-02-09 MED ORDER — POLYETHYLENE GLYCOL 3350 17 G PO PACK
17.0000 g | PACK | Freq: Every day | ORAL | Status: DC | PRN
  Administered 2023-02-13 – 2023-02-16 (×2): 17 g via ORAL
  Filled 2023-02-09 (×2): qty 1

## 2023-02-09 MED ORDER — RIVAROXABAN 20 MG PO TABS
20.0000 mg | ORAL_TABLET | Freq: Every day | ORAL | Status: DC
  Administered 2023-02-16 – 2023-02-23 (×8): 20 mg via ORAL
  Filled 2023-02-09 (×8): qty 1

## 2023-02-09 MED ORDER — HYDROXYZINE HCL 25 MG PO TABS
50.0000 mg | ORAL_TABLET | Freq: Three times a day (TID) | ORAL | Status: DC | PRN
  Administered 2023-02-15 – 2023-02-18 (×5): 50 mg via ORAL
  Filled 2023-02-09 (×5): qty 2

## 2023-02-09 MED ORDER — ATORVASTATIN CALCIUM 40 MG PO TABS
40.0000 mg | ORAL_TABLET | Freq: Every day | ORAL | Status: DC
  Administered 2023-02-10 – 2023-02-23 (×14): 40 mg via ORAL
  Filled 2023-02-09 (×14): qty 1

## 2023-02-09 MED ORDER — OXYCODONE HCL 5 MG PO TABS
15.0000 mg | ORAL_TABLET | Freq: Four times a day (QID) | ORAL | Status: DC | PRN
  Administered 2023-02-09 – 2023-02-10 (×2): 15 mg via ORAL
  Filled 2023-02-09 (×2): qty 3

## 2023-02-09 MED ORDER — INSULIN ASPART 100 UNIT/ML IJ SOLN
0.0000 [IU] | Freq: Every day | INTRAMUSCULAR | Status: DC
  Filled 2023-02-09: qty 0.05

## 2023-02-09 MED ORDER — LABETALOL HCL 5 MG/ML IV SOLN
20.0000 mg | INTRAVENOUS | Status: DC | PRN
  Administered 2023-02-09: 20 mg via INTRAVENOUS
  Filled 2023-02-09: qty 4

## 2023-02-09 MED ORDER — RIVAROXABAN 15 MG PO TABS
15.0000 mg | ORAL_TABLET | Freq: Two times a day (BID) | ORAL | Status: AC
  Administered 2023-02-09 – 2023-02-15 (×13): 15 mg via ORAL
  Filled 2023-02-09 (×13): qty 1

## 2023-02-09 MED ORDER — BUPROPION HCL ER (SR) 100 MG PO TB12
100.0000 mg | ORAL_TABLET | Freq: Every day | ORAL | Status: DC
  Administered 2023-02-10 – 2023-02-23 (×14): 100 mg via ORAL
  Filled 2023-02-09 (×14): qty 1

## 2023-02-09 MED ORDER — INSULIN ASPART 100 UNIT/ML IJ SOLN
0.0000 [IU] | Freq: Three times a day (TID) | INTRAMUSCULAR | Status: DC
  Administered 2023-02-10: 3 [IU] via SUBCUTANEOUS
  Administered 2023-02-12 – 2023-02-22 (×5): 2 [IU] via SUBCUTANEOUS
  Filled 2023-02-09: qty 0.15

## 2023-02-09 MED ORDER — SODIUM CHLORIDE 0.9% FLUSH
3.0000 mL | Freq: Two times a day (BID) | INTRAVENOUS | Status: DC
  Administered 2023-02-09 – 2023-02-23 (×24): 3 mL via INTRAVENOUS

## 2023-02-09 MED ORDER — ACETAMINOPHEN 650 MG RE SUPP: 650.0000 mg | Freq: Four times a day (QID) | RECTAL | Status: DC | PRN

## 2023-02-09 MED ORDER — FUROSEMIDE 10 MG/ML IJ SOLN
40.0000 mg | Freq: Once | INTRAMUSCULAR | Status: AC
  Administered 2023-02-09: 40 mg via INTRAVENOUS
  Filled 2023-02-09: qty 4

## 2023-02-09 MED ORDER — ACETAMINOPHEN 325 MG PO TABS
650.0000 mg | ORAL_TABLET | Freq: Four times a day (QID) | ORAL | Status: DC | PRN
  Administered 2023-02-10 – 2023-02-22 (×6): 650 mg via ORAL
  Filled 2023-02-09 (×6): qty 2

## 2023-02-09 MED ORDER — HYDROMORPHONE HCL 1 MG/ML IJ SOLN
0.5000 mg | Freq: Once | INTRAMUSCULAR | Status: AC
  Administered 2023-02-09: 0.5 mg via INTRAVENOUS
  Filled 2023-02-09: qty 1

## 2023-02-09 MED ORDER — LEVOTHYROXINE SODIUM 50 MCG PO TABS
50.0000 ug | ORAL_TABLET | Freq: Every day | ORAL | Status: DC
  Administered 2023-02-10 – 2023-02-23 (×14): 50 ug via ORAL
  Filled 2023-02-09 (×14): qty 1

## 2023-02-09 MED ORDER — FUROSEMIDE 20 MG PO TABS
20.0000 mg | ORAL_TABLET | ORAL | Status: DC
  Administered 2023-02-10: 20 mg via ORAL
  Filled 2023-02-09: qty 1

## 2023-02-09 MED ORDER — INSULIN GLARGINE-YFGN 100 UNIT/ML ~~LOC~~ SOLN
50.0000 [IU] | Freq: Every day | SUBCUTANEOUS | Status: DC
  Administered 2023-02-09 – 2023-02-17 (×9): 50 [IU] via SUBCUTANEOUS
  Filled 2023-02-09 (×10): qty 0.5

## 2023-02-09 MED ORDER — OXYCODONE HCL 5 MG PO TABS
5.0000 mg | ORAL_TABLET | Freq: Once | ORAL | Status: AC
  Administered 2023-02-09: 5 mg via ORAL
  Filled 2023-02-09: qty 1

## 2023-02-09 MED ORDER — DIAZEPAM 5 MG PO TABS
5.0000 mg | ORAL_TABLET | Freq: Once | ORAL | Status: AC
  Administered 2023-02-09: 5 mg via ORAL

## 2023-02-09 MED ORDER — DIAZEPAM 5 MG PO TABS
5.0000 mg | ORAL_TABLET | Freq: Three times a day (TID) | ORAL | Status: DC | PRN
  Administered 2023-02-11 – 2023-02-22 (×3): 5 mg via ORAL
  Filled 2023-02-09 (×3): qty 1

## 2023-02-09 NOTE — Assessment & Plan Note (Signed)
Is not a confirmed diagnosis.  Patient's strength testing is extremely difficult to do in the context of her disproportionate soft tissue to muscle mass.  Given that patient is reporting new functional decline I we will get physical therapy evaluation as well as spine CAT scan because patient is reporting some upper mid back pain.  Patient reports that her functional status has happened over the course of 2 weeks

## 2023-02-09 NOTE — Progress Notes (Signed)
ANTICOAGULATION CONSULT NOTE   Pharmacy Consult for Xarelto Indication: pulmonary embolus  Allergies  Allergen Reactions   Clindamycin Diarrhea and Nausea And Vomiting   Zofran Itching and Nausea And Vomiting   Doxycycline Diarrhea and Nausea And Vomiting   Morphine And Codeine Hives, Itching and Rash    Patient Measurements: Height: 5\' 6"  (167.6 cm) Weight: (!) 186.9 kg (412 lb) IBW/kg (Calculated) : 59.3   Labs: Recent Labs    02/09/23 1735 02/09/23 1845  HGB 11.8*  --   HCT 37.0  --   PLT 298  --   CREATININE 1.16*  --   TROPONINIHS 8 8    Estimated Creatinine Clearance: 106.6 mL/min (A) (by C-G formula based on SCr of 1.16 mg/dL (H)).   Medical History: Past Medical History:  Diagnosis Date   Anxiety and depression 06/26/2013   Cellulitis    Chronic kidney disease, stage 3a (HCC) 01/11/2023   Diabetes mellitus    Hyperlipidemia associated with type 2 diabetes mellitus (HCC) 05/02/2013   Hypertension associated with diabetes (HCC) 12/27/2020   Hypothyroidism    Infective endocarditis    PE (pulmonary embolism) ~2007-2008   Not on anticoagulation     Assessment: Patient started on Xarelto starter pack for PE treatment on 01/26/2023.  Pharmacy asked to enter orders to continue dose of Xarelto  Today patient reports is on Day 15/21 of Xarelto 15mg  po BID and last dose taken 7/19 @ 10AM  Plan:  Xarelto 15mg  BID x 13 doses (to complete 21 days) then take Xarelto 20mg  po every day  Need for further dosage adjustment appears unlikely at present.    Will sign off at this time.  Please reconsult if a change in clinical status warrants re-evaluation of dosage.   Tomio Kirk, Joselyn Glassman, PharmD 02/09/2023,10:58 PM

## 2023-02-09 NOTE — ED Provider Notes (Signed)
[facility] EMERGENCY DEPARTMENT AT [facility] Provider Note   CSN: [id number] Arrival date & time: 02/09/23  1615     History  Chief Complaint  Patient presents with   Leg Pain   Chest Pain    Karen Bennett is a 47 y.o. female.  Patient complains of significant swelling and her legs.  Patient has a right below the knee amputation.  Patient's right upper thigh area is swollen per her report and left leg is swollen.  Patient reports that she has also had some shortness of breath today.  Patient was unable to get into her wheelchair.  Patient is normally able to transfer herself from her bed to her chair however today she reports that she has so much swelling she could not get into the chair.  Patient has been taking her Lasix at home.  Patient was recently diagnosed with a pulmonary embolus after stopping her Xarelto.  Patient reports she is currently taking her Xarelto and her Lasix as directed.  Patient has a past medical history of type 2 diabetes.  Patient has osteomyelitis to her right leg which required below the knee amputation.  Patient reports that she lives at home.  Patient has an aide that comes in daily.  Patient reports that she was supposed to see her primary care physician today but she was unable to get into her chair.  The history is provided by the patient. No language interpreter was used.  Leg Pain Location:  Leg Time since incident:  1 week Leg location:  R upper leg, L upper leg and L lower leg Chest Pain Associated symptoms: shortness of breath        Home Medications Prior to Admission medications   Medication Sig Start Date End Date Taking? Authorizing Provider  acetaminophen (TYLENOL) 500 MG tablet Take 500 mg by mouth every 6 (six) hours as needed for moderate pain.    [provider]  atorvastatin (LIPITOR) 40 MG tablet Take 1 tablet (40 mg total) by mouth daily at 6 PM. 05/12/13   Buriev, Isaiah Serge, MD  BIOTIN PO Take 2 capsules  by mouth daily. Unsure of dose    [provider]  buPROPion ER (WELLBUTRIN SR) 100 MG 12 hr tablet Take 100 mg by mouth every morning. 05/09/21   [provider]  diazepam (VALIUM) 5 MG tablet Take 5 mg by mouth every 8 (eight) hours as needed for anxiety. 03/30/21   [provider]  furosemide (LASIX) 20 MG tablet Take 1 tablet (20 mg total) by mouth every other day. 01/16/23 02/15/23  [place], Eric J, DO  glucose blood test strip Use as instructed 06/26/13   Advani, Ayesha Rumpf, MD  glucose monitoring kit (FREESTYLE) monitoring kit 1 each by Does not apply route as needed for other. 06/26/13   Doris Cheadle, MD  HUMALOG KWIKPEN 100 UNIT/ML KwikPen 0-12 Units 3 (three) times daily. Inject subcutaneously twice daily as per sliding scale: if 70-150= 0 units; 151-200= 2 u; 201-250= 4 u; 251-300= 6 u; 301-350= 8 u; 351-400= 10 u; 401-450= 12u; anything greater than 400 call physician. 10/27/20   [provider]  hydrOXYzine (VISTARIL) 50 MG capsule Take 50 mg by mouth every 8 (eight) hours as needed for itching.    [provider]  insulin glargine (SEMGLEE, YFGN,) 100 UNIT/ML Solostar Pen Inject 50 Units into the skin at bedtime. 03/30/21   [provider]  levothyroxine (SYNTHROID) 50 MCG tablet Take 1 tablet (50  mcg total) by mouth daily at 6 (six) AM. 10/26/21   Leroy Sea, MD  metoCLOPramide (REGLAN) 5 MG tablet Take 1 tablet (5 mg total) by mouth every 6 (six) hours as needed for nausea. 02/09/21   Hollice Espy, MD  oxyCODONE (ROXICODONE) 15 MG immediate release tablet Take 1 tablet (15 mg total) by mouth every 6 (six) hours as needed for pain. 01/26/23   Meredeth Ide, MD  promethazine (PHENERGAN) 25 MG tablet Take 25 mg by mouth 2 (two) times daily as needed for nausea/vomiting or nausea. 01/12/21   [provider]  RIVAROXABAN Carlena Hurl) VTE STARTER PACK (15 & 20 MG) Follow package directions: Take one 15mg  tablet by mouth twice a day. On day  22, switch to one 20mg  tablet once a day. Take with food. 01/26/23   Meredeth Ide, MD  traZODone (DESYREL) 150 MG tablet Take 1 tablet (150 mg total) by mouth at bedtime. 03/02/22   Lewie Chamber, MD  XARELTO 20 MG TABS tablet Take 1 tablet (20 mg total) by mouth in the morning. Start after finishing Xarelto started pack 01/26/23   Meredeth Ide, MD      Allergies    Clindamycin, Zofran, Doxycycline, and Morphine and codeine    Review of Systems   Review of Systems  Respiratory:  Positive for shortness of breath.   Cardiovascular:  Positive for chest pain.  Musculoskeletal:  Positive for myalgias. Negative for arthralgias.  All other systems reviewed and are negative.   Physical Exam Updated Vital Signs BP (!) 192/98   Pulse 76   Temp 97.7 F (36.5 C) (Oral)   Resp 19   Ht 5\' 6"  (1.676 m)   Wt (!) 186.9 kg   LMP 04/13/2013   SpO2 97%   BMI 66.50 kg/m  Physical Exam Vitals and nursing note reviewed.  Constitutional:      Appearance: She is well-developed.  HENT:     Head: Normocephalic.  Cardiovascular:     Rate and Rhythm: Normal rate and regular rhythm.     Heart sounds: Normal heart sounds.  Pulmonary:     Effort: Pulmonary effort is normal.  Abdominal:     General: There is no distension.  Musculoskeletal:     Cervical back: Normal range of motion.     Right lower leg: Edema present.     Left lower leg: Edema present.     Comments: Edema bilateral upper legs right below the knee amputation,  Neurological:     Mental Status: She is alert and oriented to person, place, and time.     ED Results / Procedures / Treatments   Labs (all labs ordered are listed, but only abnormal results are displayed) Labs Reviewed  CBC WITH DIFFERENTIAL/PLATELET - Abnormal; Notable for the following components:      Result Value   Hemoglobin 11.8 (*)    All other components within normal limits  COMPREHENSIVE METABOLIC PANEL - Abnormal; Notable for the following components:    Sodium 131 (*)    Glucose, Bld 197 (*)    Creatinine, Ser 1.16 (*)    Calcium 8.3 (*)    Albumin 2.1 (*)    AST 14 (*)    GFR, Estimated 59 (*)    All other components within normal limits  BRAIN NATRIURETIC PEPTIDE - Abnormal; Notable for the following components:   B Natriuretic Peptide 345.1 (*)    All other components within normal limits  TROPONIN I (HIGH SENSITIVITY)  TROPONIN I (HIGH SENSITIVITY)    EKG None  Radiology DG Chest Port 1 View  Result Date: 02/09/2023 CLINICAL DATA:  Shortness of breath. EXAM: PORTABLE CHEST 1 VIEW COMPARISON:  January 24, 2023 FINDINGS: The cardiac silhouette is mildly enlarged and unchanged in size. Mild right basilar atelectasis and/or infiltrate is noted. This is mildly decreased in severity when compared to the prior study. No pleural effusion or pneumothorax is identified. The visualized skeletal structures are unremarkable. IMPRESSION: Mild right basilar atelectasis and/or infiltrate, mildly decreased in severity when compared to the prior study. Electronically Signed   By: Aram Candela M.D.   On: 02/09/2023 17:19    Procedures Procedures    Medications Ordered in ED Medications  diazepam (VALIUM) tablet 5 mg (5 mg Oral Given 02/09/23 1710)  oxyCODONE (Oxy IR/ROXICODONE) immediate release tablet 5 mg (5 mg Oral Given 02/09/23 1745)  furosemide (LASIX) injection 40 mg (40 mg Intravenous Given 02/09/23 1947)  HYDROmorphone (DILAUDID) injection 0.5 mg (0.5 mg Intravenous Given 02/09/23 1948)    ED Course/ Medical Decision Making/ A&P Clinical Course as of 02/09/23 2106  Fri Feb 09, 2023  1801 Hemoglobin(!): 11.8 [NF]  1817 Troponin I (High Sensitivity): 8 [NF]  1817 Troponin I (High Sensitivity) [NF]  1817 Troponin I (High Sensitivity): 8 [NF]  1918 ED EKG [NF]    Clinical Course User Index [NF] Rejeana Brock, [place]                             Medical Decision Making Patient complains of feeling like she is retaining too  much fluid.  Patient complains of swelling in both legs.  Patient is unable to get in her wheelchair due to the swelling  Amount and/or Complexity of Data Reviewed External Data Reviewed: notes.    Details: Hospitalist notes from previous admission reviewed Labs: ordered. Decision-making details documented in ED Course.    Details: Labs ordered reviewed and interpreted troponin is negative.  BNP is 345 Radiology: ordered and independent interpretation performed. Decision-making details documented in ED Course.    Details: Chest x-ray ordered reviewed and interpreted.  Chest x-ray shows mild atelectasis much improved from previous x-ray ECG/medicine tests: ordered and independent interpretation performed. Decision-making details documented in ED Course.    Details: EKG no acute changes Discussion of management or test interpretation with external provider(s): I discussed pt with Hospitalist who will admit  Risk Prescription drug management. Risk Details: Patient requesting a dose of Valium for anxiety.  Patient takes this at home.  Patient is given IV Lasix 40 mg.  Patient reports some improvement of shortness of breath.           Final Clinical Impression(s) / ED Diagnoses Final diagnoses:  Lower extremity edema  Shortness of breath  Pulmonary embolism, other, unspecified chronicity, unspecified whether acute cor pulmonale present [facility])    Rx / DC Orders ED Discharge Orders     None         Elson Areas, Cordelia Poche 02/09/23 2135    Rozelle Logan, DO 02/09/23 2322

## 2023-02-09 NOTE — ED Triage Notes (Signed)
Patient brought in by EMS due to bilateral leg swelling and pain. Also reports bilateral rib pain that comes and goes and is not always on both sides. Patient reports these complaints have been going on for about a month. Today patient was suppose to go see her PCP, but was unable to get into her motorized wheelchair due to pain and weakness.

## 2023-02-09 NOTE — Assessment & Plan Note (Signed)
Is a remote diagnosis with recent recurrence due to interruption of Xarelto therapy by patient.  Continue with Xarelto.

## 2023-02-09 NOTE — ED Notes (Signed)
Transported to CT. Not in room at this time.

## 2023-02-09 NOTE — Assessment & Plan Note (Signed)
Anemia panel ordered.

## 2023-02-09 NOTE — ED Notes (Signed)
ED TO INPATIENT HANDOFF REPORT  ED Nurse Name and Phone #: [place] Chett Taniguchi  S Name/Age/Gender Karen Bennett 47 y.o. female Room/Bed: WA22/WA22  Code Status   Code Status: Full Code  Home/SNF/Other Home Patient oriented to: self, place, time, and situation Is this baseline? Yes   Triage Complete: Triage complete  Chief Complaint Paraplegia [facility]) [G82.20]  Triage Note Patient brought in by EMS due to bilateral leg swelling and pain. Also reports bilateral rib pain that comes and goes and is not always on both sides. Patient reports these complaints have been going on for about a month. Today patient was suppose to go see her PCP, but was unable to get into her motorized wheelchair due to pain and weakness.   Allergies Allergies  Allergen Reactions   Clindamycin Diarrhea and Nausea And Vomiting   Zofran Itching and Nausea And Vomiting   Doxycycline Diarrhea and Nausea And Vomiting   Morphine And Codeine Hives, Itching and Rash    Level of Care/Admitting Diagnosis ED Disposition     ED Disposition  Admit   Condition  --   Comment  Hospital Area: [facility] [[id number]]  Level of Care: Med-Surg [16]  May place patient in observation at [facility] or [facility] if equivalent level of care is available:: No  Covid Evaluation: Asymptomatic - no recent exposure (last 10 days) testing not required  Diagnosis: Paraplegia (HCC) [344.1.ICD-9-CM]  Admitting Physician: Nolberto Hanlon [[id number]]  Attending Physician: Nolberto Hanlon [[id number]]          B Medical/Surgery History Past Medical History:  Diagnosis Date   Anxiety and depression 06/26/2013   Cellulitis    Chronic kidney disease, stage 3a (HCC) 01/11/2023   Diabetes mellitus    Hyperlipidemia associated with type 2 diabetes mellitus (HCC) 05/02/2013   Hypertension associated with diabetes (HCC) 12/27/2020   Hypothyroidism    Infective endocarditis    PE (pulmonary embolism) ~2007-2008   Not  on anticoagulation   Past Surgical History:  Procedure Laterality Date   Abscess removal from L groin     APPLICATION OF WOUND VAC Left 05/01/2013   Procedure: APPLICATION OF WOUND VAC;  Surgeon: Kerrin Champagne, MD;  Location: [facility];  Service: Orthopedics;  Laterality: Left;   BELOW KNEE LEG AMPUTATION Right    CYSTOSCOPY W/ URETERAL STENT PLACEMENT Right 09/03/2021   Procedure: CYSTOSCOPY WITH RETROGRADE PYELOGRAM/URETERAL STENT PLACEMENT;  Surgeon: Marcine Matar, MD;  Location: [facility];  Service: Urology;  Laterality: Right;   I & D EXTREMITY  11/12/2011   Procedure: IRRIGATION AND DEBRIDEMENT EXTREMITY;  Surgeon: Kathryne Hitch, MD;  Location: [facility];  Service: Orthopedics;  Laterality: Left;  foot left   I & D EXTREMITY Left 09/06/2012   Procedure: IRRIGATION AND DEBRIDEMENT EXTREMITY;  Surgeon: Eldred Manges, MD;  Location: [facility];  Service: Orthopedics;  Laterality: Left;   I & D EXTREMITY Left 05/01/2013   Procedure: INCISION AND DRAINAGE LEFT FOREFOOT ABCESS ;  Surgeon: Kerrin Champagne, MD;  Location: [facility];  Service: Orthopedics;  Laterality: Left;   INCISION AND DRAINAGE ABSCESS Left 12/27/2020   Procedure: INCISION AND DRAINAGE LEFT THIGH ABSCESS;  Surgeon: Berna Bue, MD;  Location: [facility];  Service: General;  Laterality: Left;   IR FLUORO GUIDE CV LINE RIGHT  09/12/2021   IR PTA VENOUS EXCEPT DIALYSIS CIRCUIT  01/04/2021   IR RADIOLOGIST EVAL & MGMT  10/25/2021   IR US GUIDE VASC ACCESS RIGHT  09/12/2021  IR VENO/EXT/UNI RIGHT  01/04/2021   Surgery to remove hematoma in L leg     TEE WITHOUT CARDIOVERSION N/A 01/03/2021   Procedure: TRANSESOPHAGEAL ECHOCARDIOGRAM (TEE);  Surgeon: Quintella Reichert, MD;  Location: [facility];  Service: Cardiovascular;  Laterality: N/A;   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     A IV Location/Drains/Wounds Patient Lines/Drains/Airways Status     Active Line/Drains/Airways     Name Placement date Placement time Site Days    Peripheral IV 02/09/23 20 G Left Antecubital 02/09/23  1711  Antecubital  less than 1   Ureteral Drain/Stent Right ureter 6 Fr. 09/03/21  1743  Right ureter  524            Intake/Output Last 24 hours  Intake/Output Summary (Last 24 hours) at 02/09/2023 2214 Last data filed at 02/09/2023 1934 Gross per 24 hour  Intake --  Output 400 ml  Net -400 ml    Labs/Imaging Results for orders placed or performed during the hospital encounter of 02/09/23 (from the past 48 hour(s))  CBC with Differential     Status: Abnormal   Collection Time: 02/09/23  5:35 PM  Result Value Ref Range   WBC 6.4 4.0 - 10.5 K/uL   RBC 4.18 3.87 - 5.11 MIL/uL   Hemoglobin 11.8 (L) 12.0 - 15.0 g/dL   HCT 62.1 30.8 - 65.7 %   MCV 88.5 80.0 - 100.0 fL   MCH 28.2 26.0 - 34.0 pg   MCHC 31.9 30.0 - 36.0 g/dL   RDW 84.6 96.2 - 95.2 %   Platelets 298 150 - 400 K/uL   nRBC 0.0 0.0 - 0.2 %   Neutrophils Relative % 69 %   Neutro Abs 4.4 1.7 - 7.7 K/uL   Lymphocytes Relative 22 %   Lymphs Abs 1.4 0.7 - 4.0 K/uL   Monocytes Relative 6 %   Monocytes Absolute 0.4 0.1 - 1.0 K/uL   Eosinophils Relative 2 %   Eosinophils Absolute 0.1 0.0 - 0.5 K/uL   Basophils Relative 1 %   Basophils Absolute 0.0 0.0 - 0.1 K/uL   Immature Granulocytes 0 %   Abs Immature Granulocytes 0.02 0.00 - 0.07 K/uL    Comment: Performed at [facility], [address] [place], [place] [address]  Comprehensive metabolic panel     Status: Abnormal   Collection Time: 02/09/23  5:35 PM  Result Value Ref Range   Sodium 131 (L) 135 - 145 mmol/L   Potassium 4.3 3.5 - 5.1 mmol/L   Chloride 102 98 - 111 mmol/L   CO2 22 22 - 32 mmol/L   Glucose, Bld 197 (H) 70 - 99 mg/dL    Comment: Glucose reference range applies only to samples taken after fasting for at least 8 hours.   BUN 16 6 - 20 mg/dL   Creatinine, Ser 4.40 (H) 0.44 - 1.00 mg/dL   Calcium 8.3 (L) 8.9 - 10.3 mg/dL   Total Protein 6.5 6.5 - 8.1 g/dL   Albumin 2.1  (L) 3.5 - 5.0 g/dL   AST 14 (L) 15 - 41 U/L   ALT 8 0 - 44 U/L   Alkaline Phosphatase 57 38 - 126 U/L   Total Bilirubin 1.0 0.3 - 1.2 mg/dL   GFR, Estimated 59 (L) >60 mL/min    Comment: (NOTE) Calculated using the CKD-EPI Creatinine Equation (2021)    Anion gap 7 5 - 15    Comment: Performed at  [facility], [address] [place], [place] [address]  Troponin I (High Sensitivity)     Status: None   Collection Time: 02/09/23  5:35 PM  Result Value Ref Range   Troponin I (High Sensitivity) 8 <18 ng/L    Comment: (NOTE) Elevated high sensitivity troponin I (hsTnI) values and significant  changes across serial measurements may suggest ACS but many other  chronic and acute conditions are known to elevate hsTnI results.  Refer to the "Links" section for chest pain algorithms and additional  guidance. Performed at [facility], [address], [place], [place] [address]   Brain natriuretic peptide     Status: Abnormal   Collection Time: 02/09/23  5:36 PM  Result Value Ref Range   B Natriuretic Peptide 345.1 (H) 0.0 - 100.0 pg/mL    Comment: Performed at [facility], [address], [place], [place] [address]  Troponin I (High Sensitivity)     Status: None   Collection Time: 02/09/23  6:45 PM  Result Value Ref Range   Troponin I (High Sensitivity) 8 <18 ng/L    Comment: (NOTE) Elevated high sensitivity troponin I (hsTnI) values and significant  changes across serial measurements may suggest ACS but many other  chronic and acute conditions are known to elevate hsTnI results.  Refer to the "Links" section for chest pain algorithms and additional  guidance. Performed at [facility], [address] [place], [place] [address]    DG Chest Port 1 View  Result Date: 02/09/2023 CLINICAL DATA:  Shortness of breath. EXAM: PORTABLE CHEST 1 VIEW COMPARISON:  January 24, 2023 FINDINGS: The cardiac silhouette is mildly  enlarged and unchanged in size. Mild right basilar atelectasis and/or infiltrate is noted. This is mildly decreased in severity when compared to the prior study. No pleural effusion or pneumothorax is identified. The visualized skeletal structures are unremarkable. IMPRESSION: Mild right basilar atelectasis and/or infiltrate, mildly decreased in severity when compared to the prior study. Electronically Signed   By: Aram Candela M.D.   On: 02/09/2023 17:19    Pending Labs Unresulted Labs (From admission, onward)     Start     Ordered   02/10/23 0500  Vitamin B12  (Anemia Panel (PNL))  Tomorrow morning,   R        02/09/23 2154   02/10/23 0500  Folate  (Anemia Panel (PNL))  Tomorrow morning,   R        02/09/23 2154   02/10/23 0500  Iron and TIBC  (Anemia Panel (PNL))  Tomorrow morning,   R        02/09/23 2154   02/10/23 0500  Ferritin  (Anemia Panel (PNL))  Tomorrow morning,   R        02/09/23 2154   02/10/23 0500  Reticulocytes  (Anemia Panel (PNL))  Tomorrow morning,   R        02/09/23 2154   02/10/23 0500  APTT  Tomorrow morning,   R        02/09/23 2202   02/10/23 0500  Protime-INR  Tomorrow morning,   R        02/09/23 2202   02/10/23 0500  Basic metabolic panel  Tomorrow morning,   R        02/09/23 2202   02/10/23 0500  CBC  Tomorrow morning,   R        02/09/23 2202  Vitals/Pain Today's Vitals   02/09/23 1945 02/09/23 2000 02/09/23 2015 02/09/23 2100  BP:  (!) 192/98  (!) 174/75  Pulse: 60 76  60  Resp: 18 19  14   Temp:   97.7 F (36.5 C)   TempSrc:   Oral   SpO2: 97% 97%  94%  Weight:      Height:      PainSc:        Isolation Precautions No active isolations  Medications Medications  acetaminophen (TYLENOL) tablet 650 mg (has no administration in time range)    Or  acetaminophen (TYLENOL) suppository 650 mg (has no administration in time range)  polyethylene glycol (MIRALAX / GLYCOLAX) packet 17 g (has no administration in time range)   sodium chloride flush (NS) 0.9 % injection 3 mL (has no administration in time range)  atorvastatin (LIPITOR) tablet 40 mg (has no administration in time range)  diazepam (VALIUM) tablet 5 mg (has no administration in time range)  buPROPion ER (WELLBUTRIN SR) 12 hr tablet 100 mg (has no administration in time range)  furosemide (LASIX) tablet 20 mg (has no administration in time range)  insulin aspart (novoLOG) injection 0-15 Units (has no administration in time range)  insulin aspart (novoLOG) injection 0-5 Units (has no administration in time range)  levothyroxine (SYNTHROID) tablet 50 mcg (has no administration in time range)  hydrOXYzine (ATARAX) tablet 50 mg (has no administration in time range)  oxyCODONE (Oxy IR/ROXICODONE) immediate release tablet 15 mg (has no administration in time range)  insulin glargine-yfgn (SEMGLEE) injection 50 Units (has no administration in time range)  diazepam (VALIUM) tablet 5 mg (5 mg Oral Given 02/09/23 1710)  oxyCODONE (Oxy IR/ROXICODONE) immediate release tablet 5 mg (5 mg Oral Given 02/09/23 1745)  furosemide (LASIX) injection 40 mg (40 mg Intravenous Given 02/09/23 1947)  HYDROmorphone (DILAUDID) injection 0.5 mg (0.5 mg Intravenous Given 02/09/23 1948)    Mobility power wheelchair     Focused Assessments A&Ox4, NSR   R Recommendations: See Admitting Provider Note  Report given to:   Additional Notes: R BKA, 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

## 2023-02-09 NOTE — ED Notes (Signed)
Floor RN voices concerns for BP, shared with hospitalist who orders labetalol to address. Will provide this prior to transport

## 2023-02-09 NOTE — H&P (Signed)
History and Physical    Patient: Karen Bennett DOB: January 17, 1976 DOA: 02/09/2023 DOS: the patient was seen and examined on 02/09/2023 PCP: [name], NP  Patient coming from: Home  Chief Complaint:  Chief Complaint  Patient presents with   Leg Pain   Chest Pain   HPI: Karen Bennett is a 47 y.o. female with medical history significant of morbid obesiety. Right BKA. Patient uses wheel chair to be mobile.  Patient reports for the last couple of weeks she is finding it increasingly difficult to lift her legs and get in and out of her wheelchair.  To an extent today that she could not do it at all and missed her appointment with the primary care provider.  Patient ascribes this to "swelling "of the legs and increased weight.  She denies any chest pain or shortness of breath that is new.  She denies any vomiting or diarrhea.  Patient has had upper mid back pain for the last 1 week that is not precipitated by trauma no aggravating or relieving factors identified.  Patient finally came to the ER today due to inability to function at home with her impairment.  ER course is notable for patient receiving Lasix, patient is on room air, patient did not report any shortness of breath to me.  Medical evaluation is sought Review of Systems: As mentioned in the history of present illness. All other systems reviewed and are negative. Past Medical History:  Diagnosis Date   Anxiety and depression 06/26/2013   Cellulitis    Chronic kidney disease, stage 3a (HCC) 01/11/2023   Diabetes mellitus    Hyperlipidemia associated with type 2 diabetes mellitus (HCC) 05/02/2013   Hypertension associated with diabetes (HCC) 12/27/2020   Hypothyroidism    Infective endocarditis    PE (pulmonary embolism) ~2007-2008   Not on anticoagulation   Past Surgical History:  Procedure Laterality Date   Abscess removal from L groin     APPLICATION OF WOUND VAC Left 05/01/2013   Procedure:  APPLICATION OF WOUND VAC;  Surgeon: Kerrin Champagne, MD;  Location: [facility];  Service: Orthopedics;  Laterality: Left;   BELOW KNEE LEG AMPUTATION Right    CYSTOSCOPY W/ URETERAL STENT PLACEMENT Right 09/03/2021   Procedure: CYSTOSCOPY WITH RETROGRADE PYELOGRAM/URETERAL STENT PLACEMENT;  Surgeon: Marcine Matar, MD;  Location: [facility];  Service: Urology;  Laterality: Right;   I & D EXTREMITY  11/12/2011   Procedure: IRRIGATION AND DEBRIDEMENT EXTREMITY;  Surgeon: Kathryne Hitch, MD;  Location: [facility];  Service: Orthopedics;  Laterality: Left;  foot left   I & D EXTREMITY Left 09/06/2012   Procedure: IRRIGATION AND DEBRIDEMENT EXTREMITY;  Surgeon: Eldred Manges, MD;  Location: [facility];  Service: Orthopedics;  Laterality: Left;   I & D EXTREMITY Left 05/01/2013   Procedure: INCISION AND DRAINAGE LEFT FOREFOOT ABCESS ;  Surgeon: Kerrin Champagne, MD;  Location: [facility];  Service: Orthopedics;  Laterality: Left;   INCISION AND DRAINAGE ABSCESS Left 12/27/2020   Procedure: INCISION AND DRAINAGE LEFT THIGH ABSCESS;  Surgeon: Berna Bue, MD;  Location: [facility];  Service: General;  Laterality: Left;   IR FLUORO GUIDE CV LINE RIGHT  09/12/2021   IR PTA VENOUS EXCEPT DIALYSIS CIRCUIT  01/04/2021   IR RADIOLOGIST EVAL & MGMT  10/25/2021   IR US GUIDE VASC ACCESS RIGHT  09/12/2021   IR VENO/EXT/UNI RIGHT  01/04/2021   Surgery to remove hematoma in L leg  TEE WITHOUT CARDIOVERSION N/A 01/03/2021   Procedure: TRANSESOPHAGEAL ECHOCARDIOGRAM (TEE);  Surgeon: Quintella Reichert, MD;  Location: [facility];  Service: Cardiovascular;  Laterality: N/A;   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   Social History:  reports that she has never smoked. She has never used smokeless tobacco. She reports that she does not drink alcohol and does not use drugs.  Allergies  Allergen Reactions   Clindamycin Diarrhea and Nausea And Vomiting   Zofran Itching and Nausea And Vomiting   Doxycycline Diarrhea and Nausea  And Vomiting   Morphine And Codeine Hives, Itching and Rash    Family History  Problem Relation Age of Onset   Diabetes Mother    Coronary artery disease Mother    Stroke Mother     Prior to Admission medications   Medication Sig Start Date End Date Taking? Authorizing Provider  acetaminophen (TYLENOL) 500 MG tablet Take 500 mg by mouth every 6 (six) hours as needed for moderate pain.    [provider]  atorvastatin (LIPITOR) 40 MG tablet Take 1 tablet (40 mg total) by mouth daily at 6 PM. 05/12/13   Buriev, Isaiah Serge, MD  BIOTIN PO Take 2 capsules by mouth daily. Unsure of dose    [provider]  buPROPion ER (WELLBUTRIN SR) 100 MG 12 hr tablet Take 100 mg by mouth every morning. 05/09/21   [provider]  diazepam (VALIUM) 5 MG tablet Take 5 mg by mouth every 8 (eight) hours as needed for anxiety. 03/30/21   [provider]  furosemide (LASIX) 20 MG tablet Take 1 tablet (20 mg total) by mouth every other day. 01/16/23 02/15/23  [place], Eric J, DO  glucose blood test strip Use as instructed 06/26/13   Advani, Ayesha Rumpf, MD  glucose monitoring kit (FREESTYLE) monitoring kit 1 each by Does not apply route as needed for other. 06/26/13   Doris Cheadle, MD  HUMALOG KWIKPEN 100 UNIT/ML KwikPen 0-12 Units 3 (three) times daily. Inject subcutaneously twice daily as per sliding scale: if 70-150= 0 units; 151-200= 2 u; 201-250= 4 u; 251-300= 6 u; 301-350= 8 u; 351-400= 10 u; 401-450= 12u; anything greater than 400 call physician. 10/27/20   [provider]  hydrOXYzine (VISTARIL) 50 MG capsule Take 50 mg by mouth every 8 (eight) hours as needed for itching.    [provider]  insulin glargine (SEMGLEE, YFGN,) 100 UNIT/ML Solostar Pen Inject 50 Units into the skin at bedtime. 03/30/21   [provider]  levothyroxine (SYNTHROID) 50 MCG tablet Take 1 tablet (50 mcg total) by mouth daily at 6 (six) AM. 10/26/21   Leroy Sea, MD   metoCLOPramide (REGLAN) 5 MG tablet Take 1 tablet (5 mg total) by mouth every 6 (six) hours as needed for nausea. 02/09/21   Hollice Espy, MD  oxyCODONE (ROXICODONE) 15 MG immediate release tablet Take 1 tablet (15 mg total) by mouth every 6 (six) hours as needed for pain. 01/26/23   Meredeth Ide, MD  promethazine (PHENERGAN) 25 MG tablet Take 25 mg by mouth 2 (two) times daily as needed for nausea/vomiting or nausea. 01/12/21   [provider]  RIVAROXABAN Carlena Hurl) VTE STARTER PACK (15 & 20 MG) Follow package directions: Take one 15mg  tablet by mouth twice a day. On day 22, switch to one 20mg  tablet once a day. Take with food. 01/26/23   Meredeth Ide, MD  traZODone (DESYREL) 150 MG tablet Take 1  tablet (150 mg total) by mouth at bedtime. 03/02/22   Lewie Chamber, MD  XARELTO 20 MG TABS tablet Take 1 tablet (20 mg total) by mouth in the morning. Start after finishing Xarelto started pack 01/26/23   Meredeth Ide, MD   Patient reports good residence to her medications as prescribed at the last time of her discharge Physical Exam: Vitals:   02/09/23 1945 02/09/23 2000 02/09/23 2015 02/09/23 2100  BP:  (!) 192/98  (!) 174/75  Pulse: 60 76  60  Resp: 18 19  14   Temp:   97.7 F (36.5 C)   TempSrc:   Oral   SpO2: 97% 97%  94%  Weight:      Height:       Neural: Morbidly obese lady, no apparent distress Respiratory; bilateral intravesicular Cardiovascular exam S1-S2 normal Abdomen all quadrants soft nontender Extremities: I did not appreciate marked pitting edema of either lower extremity.  On marked prolonged palpation there was only negligible tenting of the skin.  There is no vesicle formation of the skin or skin changes.  Patient is able to demonstrate some antigravity strength to me on the left lower extremity by lifting up her ankle of the bed.  Patient however is not able to lift the entirety of the extremity off the bed.  Patient claims that she was able to do this before, I  would say that the mass of soft tissue would make it very difficult for the patient.  Similarly no focal motor deficit is appreciated on the right lower extremity, but there is a BKA Data Reviewed:  Labs on Admission:  Results for orders placed or performed during the hospital encounter of 02/09/23 (from the past 24 hour(s))  CBC with Differential     Status: Abnormal   Collection Time: 02/09/23  5:35 PM  Result Value Ref Range   WBC 6.4 4.0 - 10.5 K/uL   RBC 4.18 3.87 - 5.11 MIL/uL   Hemoglobin 11.8 (L) 12.0 - 15.0 g/dL   HCT 19.1 47.8 - 29.5 %   MCV 88.5 80.0 - 100.0 fL   MCH 28.2 26.0 - 34.0 pg   MCHC 31.9 30.0 - 36.0 g/dL   RDW 62.1 30.8 - 65.7 %   Platelets 298 150 - 400 K/uL   nRBC 0.0 0.0 - 0.2 %   Neutrophils Relative % 69 %   Neutro Abs 4.4 1.7 - 7.7 K/uL   Lymphocytes Relative 22 %   Lymphs Abs 1.4 0.7 - 4.0 K/uL   Monocytes Relative 6 %   Monocytes Absolute 0.4 0.1 - 1.0 K/uL   Eosinophils Relative 2 %   Eosinophils Absolute 0.1 0.0 - 0.5 K/uL   Basophils Relative 1 %   Basophils Absolute 0.0 0.0 - 0.1 K/uL   Immature Granulocytes 0 %   Abs Immature Granulocytes 0.02 0.00 - 0.07 K/uL  Comprehensive metabolic panel     Status: Abnormal   Collection Time: 02/09/23  5:35 PM  Result Value Ref Range   Sodium 131 (L) 135 - 145 mmol/L   Potassium 4.3 3.5 - 5.1 mmol/L   Chloride 102 98 - 111 mmol/L   CO2 22 22 - 32 mmol/L   Glucose, Bld 197 (H) 70 - 99 mg/dL   BUN 16 6 - 20 mg/dL   Creatinine, Ser 8.46 (H) 0.44 - 1.00 mg/dL   Calcium 8.3 (L) 8.9 - 10.3 mg/dL   Total Protein 6.5 6.5 - 8.1 g/dL   Albumin 2.1 (L)  3.5 - 5.0 g/dL   AST 14 (L) 15 - 41 U/L   ALT 8 0 - 44 U/L   Alkaline Phosphatase 57 38 - 126 U/L   Total Bilirubin 1.0 0.3 - 1.2 mg/dL   GFR, Estimated 59 (L) >60 mL/min   Anion gap 7 5 - 15  Troponin I (High Sensitivity)     Status: None   Collection Time: 02/09/23  5:35 PM  Result Value Ref Range   Troponin I (High Sensitivity) 8 <18 ng/L  Brain  natriuretic peptide     Status: Abnormal   Collection Time: 02/09/23  5:36 PM  Result Value Ref Range   B Natriuretic Peptide 345.1 (H) 0.0 - 100.0 pg/mL  Troponin I (High Sensitivity)     Status: None   Collection Time: 02/09/23  6:45 PM  Result Value Ref Range   Troponin I (High Sensitivity) 8 <18 ng/L   Basic Metabolic Panel: Recent Labs  Lab 02/09/23 1735  NA 131*  K 4.3  CL 102  CO2 22  GLUCOSE 197*  BUN 16  CREATININE 1.16*  CALCIUM 8.3*   Liver Function Tests: Recent Labs  Lab 02/09/23 1735  AST 14*  ALT 8  ALKPHOS 57  BILITOT 1.0  PROT 6.5  ALBUMIN 2.1*   No results for input(s): "LIPASE", "AMYLASE" in the last 168 hours. No results for input(s): "AMMONIA" in the last 168 hours. CBC: Recent Labs  Lab 02/09/23 1735  WBC 6.4  NEUTROABS 4.4  HGB 11.8*  HCT 37.0  MCV 88.5  PLT 298   Cardiac Enzymes: Recent Labs  Lab 02/09/23 1735 02/09/23 1845  TROPONINIHS 8 8    BNP (last 3 results) No results for input(s): "PROBNP" in the last 8760 hours. CBG: No results for input(s): "GLUCAP" in the last 168 hours.  Radiological Exams on Admission:  DG Chest Port 1 View  Result Date: 02/09/2023 CLINICAL DATA:  Shortness of breath. EXAM: PORTABLE CHEST 1 VIEW COMPARISON:  January 24, 2023 FINDINGS: The cardiac silhouette is mildly enlarged and unchanged in size. Mild right basilar atelectasis and/or infiltrate is noted. This is mildly decreased in severity when compared to the prior study. No pleural effusion or pneumothorax is identified. The visualized skeletal structures are unremarkable. IMPRESSION: Mild right basilar atelectasis and/or infiltrate, mildly decreased in severity when compared to the prior study. Electronically Signed   By: Aram Candela M.D.   On: 02/09/2023 17:19    EKG: Independently reviewed. Sinus rhythm IVCD. Similr to before.   Assessment and Plan: * Paraplegia (HCC) Is not a confirmed diagnosis.  Patient's strength testing is  extremely difficult to do in the context of her disproportionate soft tissue to muscle mass.  Given that patient is reporting new functional decline I we will get physical therapy evaluation as well as spine CAT scan because patient is reporting some upper mid back pain.  Patient reports that her functional status has happened over the course of 2 weeks  Normocytic anemia Anemia panel ordered  History of pulmonary embolism Is a remote diagnosis with recent recurrence due to interruption of Xarelto therapy by patient.  Continue with Xarelto.  CKD (chronic kidney disease) Creatinine seems to be at baseline.  I suspect this is the reason for patient's tendency to get fluid overloaded.  Her recent echo from January 12, 2023 shows a low normal function of the left ventricular.  At this time during my clinical evaluation I am not convinced that the patient has marked anasarca  or marked fluid overload.  We will monitor patient's risk clinical response to single dose Lasix given in the ER.  Will also check the patient's weight and compared to the last documented weight to see if patient has gained marked amount of weight that is not apparent on physical exam.  Continue with home oral dose of oral dose of Lasix      Advance Care Planning:   Code Status: Prior full code.  Consults: PT eval.  Family Communication: per patient.  Severity of Illness: The appropriate patient status for this patient is OBSERVATION. Observation status is judged to be reasonable and necessary in order to provide the required intensity of service to ensure the patient's safety. The patient's presenting symptoms, physical exam findings, and initial radiographic and laboratory data in the context of their medical condition is felt to place them at decreased risk for further clinical deterioration. Furthermore, it is anticipated that the patient will be medically stable for discharge from the hospital within 2 midnights of admission.    Author: Nolberto Hanlon, MD 02/09/2023 9:50 PM  For on call review [email or web address].

## 2023-02-09 NOTE — Assessment & Plan Note (Signed)
Creatinine seems to be at baseline.  I suspect this is the reason for patient's tendency to get fluid overloaded.  Her recent echo from January 12, 2023 shows a low normal function of the left ventricular.  At this time during my clinical evaluation I am not convinced that the patient has marked anasarca or marked fluid overload.  We will monitor patient's risk clinical response to single dose Lasix given in the ER.  Will also check the patient's weight and compared to the last documented weight to see if patient has gained marked amount of weight that is not apparent on physical exam.  Continue with home oral dose of oral dose of Lasix

## 2023-02-10 DIAGNOSIS — G8220 Paraplegia, unspecified: Secondary | ICD-10-CM | POA: Diagnosis not present

## 2023-02-10 LAB — PROTIME-INR
INR: 1.6 — ABNORMAL HIGH (ref 0.8–1.2)
Prothrombin Time: 19.1 seconds — ABNORMAL HIGH (ref 11.4–15.2)

## 2023-02-10 LAB — CBC
HCT: 35.6 % — ABNORMAL LOW (ref 36.0–46.0)
Hemoglobin: 11.1 g/dL — ABNORMAL LOW (ref 12.0–15.0)
MCH: 28.5 pg (ref 26.0–34.0)
MCHC: 31.2 g/dL (ref 30.0–36.0)
MCV: 91.5 fL (ref 80.0–100.0)
Platelets: 271 10*3/uL (ref 150–400)
RBC: 3.89 MIL/uL (ref 3.87–5.11)
RDW: 13.1 % (ref 11.5–15.5)
WBC: 6.3 10*3/uL (ref 4.0–10.5)
nRBC: 0 % (ref 0.0–0.2)

## 2023-02-10 LAB — BASIC METABOLIC PANEL
Anion gap: 4 — ABNORMAL LOW (ref 5–15)
BUN: 19 mg/dL (ref 6–20)
CO2: 24 mmol/L (ref 22–32)
Calcium: 8.1 mg/dL — ABNORMAL LOW (ref 8.9–10.3)
Chloride: 105 mmol/L (ref 98–111)
Creatinine, Ser: 1.28 mg/dL — ABNORMAL HIGH (ref 0.44–1.00)
GFR, Estimated: 53 mL/min — ABNORMAL LOW (ref >60)
Glucose, Bld: 173 mg/dL — ABNORMAL HIGH (ref 70–99)
Potassium: 4.1 mmol/L (ref 3.5–5.1)
Sodium: 133 mmol/L — ABNORMAL LOW (ref 135–145)

## 2023-02-10 LAB — APTT: aPTT: 44 seconds — ABNORMAL HIGH (ref 24–36)

## 2023-02-10 LAB — GLUCOSE, CAPILLARY
Glucose-Capillary: 158 mg/dL — ABNORMAL HIGH (ref 70–99)
Glucose-Capillary: 100 mg/dL — ABNORMAL HIGH (ref 70–99)
Glucose-Capillary: 79 mg/dL (ref 70–99)
Glucose-Capillary: 192 mg/dL — ABNORMAL HIGH (ref 70–99)

## 2023-02-10 LAB — RETICULOCYTES
Immature Retic Fract: 14.4 % (ref 2.3–15.9)
RBC.: 3.88 MIL/uL (ref 3.87–5.11)
Retic Count, Absolute: 88.9 K/uL (ref 19.0–186.0)
Retic Ct Pct: 2.3 % (ref 0.4–3.1)

## 2023-02-10 LAB — IRON AND TIBC
Iron: 32 ug/dL (ref 28–170)
Saturation Ratios: 17 % (ref 10.4–31.8)
TIBC: 183 ug/dL — ABNORMAL LOW (ref 250–450)
UIBC: 151 ug/dL

## 2023-02-10 LAB — LIPID PANEL
Cholesterol: 214 mg/dL — ABNORMAL HIGH (ref 0–200)
HDL: 31 mg/dL — ABNORMAL LOW (ref >40)
LDL Cholesterol: 141 mg/dL — ABNORMAL HIGH (ref 0–99)
Total CHOL/HDL Ratio: 6.9 RATIO
Triglycerides: 208 mg/dL — ABNORMAL HIGH (ref <150)
VLDL: 42 mg/dL — ABNORMAL HIGH (ref 0–40)

## 2023-02-10 LAB — FOLATE: Folate: 8 ng/mL (ref >5.9)

## 2023-02-10 LAB — VITAMIN B12: Vitamin B-12: 136 pg/mL — ABNORMAL LOW (ref 180–914)

## 2023-02-10 LAB — FERRITIN: Ferritin: 49 ng/mL (ref 11–307)

## 2023-02-10 MED ORDER — TRAZODONE HCL 50 MG PO TABS
150.0000 mg | ORAL_TABLET | Freq: Every day | ORAL | Status: DC
  Administered 2023-02-10 – 2023-02-22 (×13): 150 mg via ORAL
  Filled 2023-02-10 (×14): qty 3

## 2023-02-10 MED ORDER — BUPROPION HCL ER (SR) 100 MG PO TB12: 100.0000 mg | ORAL_TABLET | Freq: Every morning | ORAL | Status: DC

## 2023-02-10 MED ORDER — DIAZEPAM 5 MG PO TABS: 5.0000 mg | ORAL_TABLET | Freq: Three times a day (TID) | ORAL | Status: DC | PRN

## 2023-02-10 MED ORDER — LEVOTHYROXINE SODIUM 50 MCG PO TABS: 50.0000 ug | ORAL_TABLET | Freq: Every day | ORAL | Status: DC

## 2023-02-10 MED ORDER — FUROSEMIDE 10 MG/ML IJ SOLN
60.0000 mg | Freq: Two times a day (BID) | INTRAMUSCULAR | Status: DC
  Administered 2023-02-10 – 2023-02-11 (×2): 60 mg via INTRAVENOUS
  Filled 2023-02-10 (×2): qty 6

## 2023-02-10 MED ORDER — METOCLOPRAMIDE HCL 5 MG PO TABS
5.0000 mg | ORAL_TABLET | Freq: Four times a day (QID) | ORAL | Status: DC | PRN
  Administered 2023-02-13 – 2023-02-22 (×8): 5 mg via ORAL
  Filled 2023-02-10 (×9): qty 1

## 2023-02-10 MED ORDER — ACETAMINOPHEN 500 MG PO TABS
500.0000 mg | ORAL_TABLET | Freq: Four times a day (QID) | ORAL | Status: DC | PRN
  Administered 2023-02-12 – 2023-02-21 (×2): 500 mg via ORAL
  Filled 2023-02-10 (×2): qty 1

## 2023-02-10 MED ORDER — OXYCODONE HCL 5 MG PO TABS
15.0000 mg | ORAL_TABLET | Freq: Four times a day (QID) | ORAL | Status: DC | PRN
  Administered 2023-02-10 – 2023-02-23 (×33): 15 mg via ORAL
  Filled 2023-02-10 (×34): qty 3

## 2023-02-10 NOTE — Evaluation (Signed)
Physical Therapy Evaluation Patient Details Name: Karen Bennett MRN: [id number] DOB: 1975/08/11 Today's Date: 02/10/2023  History of Present Illness  Patient is a 47 year old female presenting to [facility] ED on 7/19 with complaints of RLE pain and weakness. CT thoracolumbar spine shows severe central canal stenosis on the right side at level of L2-L3 and moderate on the left side. Of note recently hospitalized for pneumonia. PMH: DM, Obesity, HTN, Rt BKA, CKD3, HLD, hypothyroidism, hx of PE, CHF, R-BKA.  Clinical Impression  Pt admitted with above diagnosis. Pt supine in bed agreeable to be seen, noted increase in BLE edema/girth from session during previous admission, pt endorses 8/10 BLE pain. Pt demonstrated bed mobility with minA for protection of BLE from bed rail, able to sit EOB for >10min with distant supervision with L foot propped up. Pt declines performanc eof transfer to chair at this time citing fatigue, pain, and concerns that chair would hold her weight, will request bariatric recliner from facility. Educated pt that if she cannot demonstrate transfer at mod I or IND level pt will require hoyer lift, pt verbalized understanding, nursing informed. Pt currently with functional limitations due to the deficits listed below (see PT Problem List). Pt will benefit from acute skilled PT to increase their independence and safety with mobility to allow discharge.           Assistance Recommended at Discharge Intermittent Supervision/Assistance  If plan is discharge home, recommend the following:  Can travel by private vehicle  Help with stairs or ramp for entrance;Assist for transportation;Two people to help with walking and/or transfers;Two people to help with bathing/dressing/bathroom        Equipment Recommendations Other (comment) Michiel Sites lift if going home and unable to perform transfer to power chair at mod I level)  Recommendations for Other Services       Functional Status  Assessment Patient has had a recent decline in their functional status and demonstrates the ability to make significant improvements in function in a reasonable and predictable amount of time.     Precautions / Restrictions Precautions Precautions: Fall Precaution Comments: Hx of R-BKA, WC bound at baseline (power chair)` Restrictions Weight Bearing Restrictions: No      Mobility  Bed Mobility Overal bed mobility: Needs Assistance Bed Mobility: Supine to Sit, Sit to Supine     Supine to sit: Min assist, HOB elevated Sit to supine: Min assist, HOB elevated   General bed mobility comments: Min assist to lift LLE to protect skin and avoid rubbing on bed rail, otherwise min guard, pt able to sit EOB with L foot propped on trashcan    Transfers                   General transfer comment: pt refused.    Ambulation/Gait               General Gait Details: Power chair at baseline  Stairs            Wheelchair Mobility     Tilt Bed    Modified Rankin (Stroke Patients Only)       Balance                                             Pertinent Vitals/Pain Pain Assessment Pain Assessment: 0-10 Pain Score: 8  Pain Location: bilateral legs Pain Descriptors /  Indicators: Pressure, Tender Pain Intervention(s): Limited activity within patient's tolerance, Monitored during session, Repositioned    Home Living Family/patient expects to be discharged to:: Private residence Living Arrangements: Alone Available Help at Discharge: Personal care attendant Type of Home: Apartment Home Access: Level entry       Home Layout: One level Home Equipment: Grab bars - tub/shower;Grab bars - toilet;Tub bench;Adaptive equipment;Wheelchair - power;Hand held shower head Additional Comments: M-F aide 3-6hours depending, alternate weekends    Prior Function Prior Level of Function : Independent/Modified Independent             Mobility  Comments: power chair for mobility, completes AP scoot to power chair otherwise bedbound ADLs Comments: aid helps with cooking and cleaning tasks IADLs, otherwise IND with ADLs     Hand Dominance   Dominant Hand: Right    Extremity/Trunk Assessment   Upper Extremity Assessment Upper Extremity Assessment: Overall WFL for tasks assessed    Lower Extremity Assessment Lower Extremity Assessment: RLE deficits/detail;LLE deficits/detail RLE Deficits / Details: Noted R-BKA, RLE edema < LLE RLE Sensation: WNL LLE Deficits / Details: LLE edema greater than RLE, painful to touch LLE Sensation: WNL    Cervical / Trunk Assessment Cervical / Trunk Assessment: Normal  Communication   Communication: No difficulties  Cognition Arousal/Alertness: Awake/alert Behavior During Therapy: WFL for tasks assessed/performed Overall Cognitive Status: Within Functional Limits for tasks assessed                                 General Comments: Pt tearful about inability to mobilize        General Comments      Exercises     Assessment/Plan    PT Assessment Patient needs continued PT services  PT Problem List Decreased strength;Decreased range of motion;Decreased activity tolerance;Decreased mobility;Pain;Cardiopulmonary status limiting activity       PT Treatment Interventions DME instruction;Functional mobility training;Therapeutic activities;Therapeutic exercise;Neuromuscular re-education;Patient/family education;Wheelchair mobility training    PT Goals (Current goals can be found in the Care Plan section)  Acute Rehab PT Goals Patient Stated Goal: To go home and be able to transfer PT Goal Formulation: With patient Time For Goal Achievement: 02/24/23 Potential to Achieve Goals: Fair    Frequency Min 1X/week     Co-evaluation               AM-PAC PT "6 Clicks" Mobility  Outcome Measure Help needed turning from your back to your side while in a flat bed  without using bedrails?: None Help needed moving from lying on your back to sitting on the side of a flat bed without using bedrails?: A Little Help needed moving to and from a bed to a chair (including a wheelchair)?: Total Help needed standing up from a chair using your arms (e.g., wheelchair or bedside chair)?: Total Help needed to walk in hospital room?: Total Help needed climbing 3-5 steps with a railing? : Total 6 Click Score: 11    End of Session   Activity Tolerance: Patient limited by pain Patient left: in bed;with call bell/phone within reach;with nursing/sitter in room Nurse Communication: Mobility status;Need for lift equipment (Pt will require hoyer to bariatric recliner at this time) PT Visit Diagnosis: Pain;Muscle weakness (generalized) (M62.81) Pain - Right/Left: Left (bilateral) Pain - part of body: Leg;Knee;Ankle and joints of foot    Time: 9604-5409 PT Time Calculation (min) (ACUTE ONLY): 39 min   Charges:   PT  Evaluation $PT Eval Low Complexity: 1 Low PT Treatments $Therapeutic Activity: 23-37 mins PT General Charges $$ ACUTE PT VISIT: 1 Visit         Jamesetta Geralds, PT, DPT [facility] Office: [phone]  Jamesetta Geralds 02/10/2023, 4:16 PM

## 2023-02-10 NOTE — Plan of Care (Signed)

## 2023-02-10 NOTE — Progress Notes (Signed)
PROGRESS NOTE    Karen Bennett  ZOX:[id number] DOB: Feb 09, 1976 DOA: 02/09/2023 PCP: [name], NP   Brief Narrative:  HPI: Karen Bennett is a 47 y.o. female with medical history significant of morbid obesiety. Right BKA. Patient uses wheel chair to be mobile.  Patient reports for the last couple of weeks she is finding it increasingly difficult to lift her legs and get in and out of her wheelchair.  To an extent today that she could not do it at all and missed her appointment with the primary care provider.  Patient ascribes this to "swelling "of the legs and increased weight.  She denies any chest pain or shortness of breath that is new.  She denies any vomiting or diarrhea.  Patient has had upper mid back pain for the last 1 week that is not precipitated by trauma no aggravating or relieving factors identified.   Patient finally came to the ER today due to inability to function at home with her impairment.   ER course is notable for patient receiving Lasix, patient is on room air, patient did not report any shortness of breath to me.  Medical evaluation is sought  Assessment & Plan:   Principal Problem:   Paraplegia (HCC) Active Problems:   Normocytic anemia   History of pulmonary embolism   CKD (chronic kidney disease)  Right leg pain/lumbar neuropathy: Patient has been complaining of right leg pain which started recently.  Unable to lift as much as she used to in the past.  CT thoracolumbar spine shows severe central canal stenosis on the right side at level of L2-L3 and moderate on the left side.  Will consult PT OT while she is inpatient but she may benefit from neurosurgery visit as outpatient for possible steroid injections or any other intervention.  She verbalizes understanding.  Type 2 diabetes mellitus: Recent hemoglobin A1c 01/12/2023 was 10.7.  She takes 50 units of long-acting insulin at home which we have resumed along with SSI.  Blood sugar  controlled.  Hyperlipidemia: Continue atorvastatin.  Check lipid panel.  Fluid overload: Patient believes that she has a lot of swelling which she has developed over the course of last 1 month.  She does appear to have edema but at the same time she has a lot of fat tissue due to morbid obesity.  She takes Lasix 20 mg every other day only.  I will start her on Lasix 60 mg IV twice daily.  Echo done recently in June 2024 showed grade 2 diastolic dysfunction but normal EF.  History of PE: Stable.  Continue Xarelto.  Normocytic anemia: Stable.  Acquired hypothyroidism: Continue Synthroid.  Morbid obesity: Weight loss and dietary modification counseled.  May benefit from following up with the obesity clinic as outpatient.  DVT prophylaxis: SCDs Start: 02/09/23 2202 Xarelto   Code Status: Full Code  Family Communication:  None present at bedside.  Plan of care discussed with patient in length and he/she verbalized understanding and agreed with it.  Status is: Observation The patient will require care spanning > 2 midnights and should be moved to inpatient because: Needs IV diuresis.   Estimated body mass index is 70.6 kg/m as calculated from the following:   Height as of this encounter: 5\' 6"  (1.676 m).   Weight as of this encounter: 198.4 kg.    Nutritional Assessment: Body mass index is 70.6 kg/m.Marland Kitchen Seen by dietician.  I agree with the assessment and plan as outlined below: Nutrition Status:        .  Skin Assessment: I have examined the patient's skin and I agree with the wound assessment as performed by the wound care RN as outlined below:    Consultants:  None  Procedures:  None  Antimicrobials:  Anti-infectives (From admission, onward)    None         Subjective: Patient seen and examined.  Complains of rib pain, upper back pain as well as right lower extremity pain but she denied any low back pain.  Objective: Vitals:   02/10/23 0756 02/10/23 0803  02/10/23 1144 02/10/23 1317  BP:  (!) 116/51 (!) 110/46 (!) 122/54  Pulse:  (!) 54 (!) 53 (!) 55  Resp:      Temp:  98.1 F (36.7 C) 98.9 F (37.2 C) 97.7 F (36.5 C)  TempSrc:  Oral Oral Oral  SpO2:  93% 92% 96%  Weight: (!) 198.4 kg     Height:        Intake/Output Summary (Last 24 hours) at 02/10/2023 1339 Last data filed at 02/10/2023 0900 Gross per 24 hour  Intake 240 ml  Output 1350 ml  Net -1110 ml   Filed Weights   02/09/23 1630 02/09/23 2354 02/10/23 0756  Weight: (!) 186.9 kg (!) 198.7 kg (!) 198.4 kg    Examination:  General exam: Appears calm and comfortable, morbidly obese Respiratory system: Clear to auscultation. Respiratory effort normal. Cardiovascular system: S1 & S2 heard, RRR. No JVD, murmurs, rubs, gallops or clicks.  +2-3 pitting edema bilateral lower extremities. Gastrointestinal system: Abdomen is nondistended, soft and nontender. No organomegaly or masses felt. Normal bowel sounds heard. Central nervous system: Alert and oriented. No focal neurological deficits. Extremities: Right BKA Skin: No rashes, lesions or ulcers Psychiatry: Judgement and insight appear normal. Mood & affect appropriate.    Data Reviewed: I have personally reviewed following labs and imaging studies  CBC: Recent Labs  Lab 02/09/23 1735 02/10/23 0605  WBC 6.4 6.3  NEUTROABS 4.4  --   HGB 11.8* 11.1*  HCT 37.0 35.6*  MCV 88.5 91.5  PLT 298 271   Basic Metabolic Panel: Recent Labs  Lab 02/09/23 1735 02/10/23 0605  NA 131* 133*  K 4.3 4.1  CL 102 105  CO2 22 24  GLUCOSE 197* 173*  BUN 16 19  CREATININE 1.16* 1.28*  CALCIUM 8.3* 8.1*   GFR: Estimated Creatinine Clearance: 100.7 mL/min (A) (by C-G formula based on SCr of 1.28 mg/dL (H)). Liver Function Tests: Recent Labs  Lab 02/09/23 1735  AST 14*  ALT 8  ALKPHOS 57  BILITOT 1.0  PROT 6.5  ALBUMIN 2.1*   No results for input(s): "LIPASE", "AMYLASE" in the last 168 hours. No results for input(s):  "AMMONIA" in the last 168 hours. Coagulation Profile: Recent Labs  Lab 02/10/23 0605  INR 1.6*   Cardiac Enzymes: No results for input(s): "CKTOTAL", "CKMB", "CKMBINDEX", "TROPONINI" in the last 168 hours. BNP (last 3 results) No results for input(s): "PROBNP" in the last 8760 hours. HbA1C: No results for input(s): "HGBA1C" in the last 72 hours. CBG: Recent Labs  Lab 02/09/23 2240 02/09/23 2350 02/10/23 0717 02/10/23 1141  GLUCAP 171* 195* 158* 100*   Lipid Profile: No results for input(s): "CHOL", "HDL", "LDLCALC", "TRIG", "CHOLHDL", "LDLDIRECT" in the last 72 hours. Thyroid Function Tests: No results for input(s): "TSH", "T4TOTAL", "FREET4", "T3FREE", "THYROIDAB" in the last 72 hours. Anemia Panel: Recent Labs    02/10/23 0605  VITAMINB12 136*  FOLATE 8.0  FERRITIN 49  TIBC 183*  IRON  32  RETICCTPCT 2.3   Sepsis Labs: No results for input(s): "PROCALCITON", "LATICACIDVEN" in the last 168 hours.  No results found for this or any previous visit (from the past 240 hour(s)).   Radiology Studies: CT LUMBAR SPINE WO CONTRAST  Result Date: 02/09/2023 CLINICAL DATA:  Metastatic disease for paraplegia. EXAM: CT LUMBAR SPINE WITHOUT CONTRAST TECHNIQUE: Multidetector CT imaging of the lumbar spine was performed without intravenous contrast administration. Multiplanar CT image reconstructions were also generated. RADIATION DOSE REDUCTION: This exam was performed according to the departmental dose-optimization program which includes automated exposure control, adjustment of the mA and/or kV according to patient size and/or use of iterative reconstruction technique. COMPARISON:  CT angiogram chest 12/26/2022. CT abdomen and pelvis 09/03/2021. FINDINGS: THORACIC SPINE: Alignment: Normal. Vertebrae: No acute fracture or focal pathologic process. Paraspinal and other soft tissues: Negative. No central spinal canal abnormality identified on this noncontrast study. Disc levels: Disc  spaces are maintained. There is no central canal or neural foraminal stenosis at any level. Other: Moderate right and small left pleural effusions are present. There is airspace consolidation in the right lower lobe similar to prior. LUMBAR SPINE: Segmentation: 5 lumbar type vertebrae. Alignment: There is 5 mm of anterolisthesis at L5-S1. Alignment is otherwise anatomic. This is unchanged from prior. Vertebrae: No acute fracture or focal osseous lesion. There severe degenerative changes at L5-S1 with vacuum disc phenomena and disc space height loss similar to the prior study. Paraspinal and other soft tissues: Negative. Disc levels: T12-L1: Within normal limits. L1-L2: Bilateral facet arthropathy. Mild bilateral neural foraminal stenosis. No central canal stenosis. L2-L3: Bilateral facet arthropathy and disc bulge. Thickening of the ligamentum flavum. Moderate severe central canal stenosis. Severe right and moderate left neural foraminal stenosis. L3-L4: Broad-based disc bulge and bilateral facet arthropathy. Mild central canal stenosis. Moderate right and mild left neural foraminal stenosis. L4-L5: Bilateral facet arthropathy and disc bulge. Mild central canal stenosis. Severe left and moderate right neural foraminal stenosis. L5-S1: Bilateral facet arthropathy. Moderate right and mild left neural foraminal stenosis. No central canal stenosis. Other: There is calcified atherosclerotic disease throughout the aorta. Right ureteral stent is present. IMPRESSION: 1. Thoracic spine appears within normal limits. 2. No acute fracture or traumatic subluxation of the lumbar spine. 3. Multilevel degenerative changes of the lumbar spine, most severe at L2-L3 where there is moderate to severe central canal stenosis, severe right and moderate left neural foraminal stenosis. 4. Additional levels of lumbar neural foraminal stenosis and central canal stenosis as described above. 5. Moderate right and small left pleural effusions.  Electronically Signed   By: Darliss Cheney M.D.   On: 02/09/2023 23:17   CT THORACIC SPINE WO CONTRAST  Result Date: 02/09/2023 CLINICAL DATA:  Metastatic disease for paraplegia. EXAM: CT LUMBAR SPINE WITHOUT CONTRAST TECHNIQUE: Multidetector CT imaging of the lumbar spine was performed without intravenous contrast administration. Multiplanar CT image reconstructions were also generated. RADIATION DOSE REDUCTION: This exam was performed according to the departmental dose-optimization program which includes automated exposure control, adjustment of the mA and/or kV according to patient size and/or use of iterative reconstruction technique. COMPARISON:  CT angiogram chest 12/26/2022. CT abdomen and pelvis 09/03/2021. FINDINGS: THORACIC SPINE: Alignment: Normal. Vertebrae: No acute fracture or focal pathologic process. Paraspinal and other soft tissues: Negative. No central spinal canal abnormality identified on this noncontrast study. Disc levels: Disc spaces are maintained. There is no central canal or neural foraminal stenosis at any level. Other: Moderate right and small left pleural effusions are  present. There is airspace consolidation in the right lower lobe similar to prior. LUMBAR SPINE: Segmentation: 5 lumbar type vertebrae. Alignment: There is 5 mm of anterolisthesis at L5-S1. Alignment is otherwise anatomic. This is unchanged from prior. Vertebrae: No acute fracture or focal osseous lesion. There severe degenerative changes at L5-S1 with vacuum disc phenomena and disc space height loss similar to the prior study. Paraspinal and other soft tissues: Negative. Disc levels: T12-L1: Within normal limits. L1-L2: Bilateral facet arthropathy. Mild bilateral neural foraminal stenosis. No central canal stenosis. L2-L3: Bilateral facet arthropathy and disc bulge. Thickening of the ligamentum flavum. Moderate severe central canal stenosis. Severe right and moderate left neural foraminal stenosis. L3-L4: Broad-based  disc bulge and bilateral facet arthropathy. Mild central canal stenosis. Moderate right and mild left neural foraminal stenosis. L4-L5: Bilateral facet arthropathy and disc bulge. Mild central canal stenosis. Severe left and moderate right neural foraminal stenosis. L5-S1: Bilateral facet arthropathy. Moderate right and mild left neural foraminal stenosis. No central canal stenosis. Other: There is calcified atherosclerotic disease throughout the aorta. Right ureteral stent is present. IMPRESSION: 1. Thoracic spine appears within normal limits. 2. No acute fracture or traumatic subluxation of the lumbar spine. 3. Multilevel degenerative changes of the lumbar spine, most severe at L2-L3 where there is moderate to severe central canal stenosis, severe right and moderate left neural foraminal stenosis. 4. Additional levels of lumbar neural foraminal stenosis and central canal stenosis as described above. 5. Moderate right and small left pleural effusions. Electronically Signed   By: Darliss Cheney M.D.   On: 02/09/2023 23:17   DG Chest Port 1 View  Result Date: 02/09/2023 CLINICAL DATA:  Shortness of breath. EXAM: PORTABLE CHEST 1 VIEW COMPARISON:  January 24, 2023 FINDINGS: The cardiac silhouette is mildly enlarged and unchanged in size. Mild right basilar atelectasis and/or infiltrate is noted. This is mildly decreased in severity when compared to the prior study. No pleural effusion or pneumothorax is identified. The visualized skeletal structures are unremarkable. IMPRESSION: Mild right basilar atelectasis and/or infiltrate, mildly decreased in severity when compared to the prior study. Electronically Signed   By: Aram Candela M.D.   On: 02/09/2023 17:19    Scheduled Meds:  atorvastatin  40 mg Oral Daily   buPROPion ER  100 mg Oral Daily   furosemide  60 mg Intravenous BID   insulin aspart  0-15 Units Subcutaneous TID WC   insulin aspart  0-5 Units Subcutaneous QHS   insulin glargine-yfgn  50 Units  Subcutaneous QHS   levothyroxine  50 mcg Oral Q0600   Rivaroxaban  15 mg Oral BID WC   Followed by   Melene Muller ON 02/16/2023] rivaroxaban  20 mg Oral Q supper   sodium chloride flush  3 mL Intravenous Q12H   Continuous Infusions:   LOS: 0 days   Hughie Closs, MD [facility]  02/10/2023, 1:39 PM   *Please note that this is a verbal dictation therefore any spelling or grammatical errors are due to the "Dragon [employer] One" system interpretation.  Please page via Amion and do not message via secure chat for urgent patient care matters. Secure chat can be used for non urgent patient care matters.  How to contact the [facility] Attending or Consulting provider 7A - 7P or covering provider during after hours 7P -7A, for this patient?  Check the care team in [facility] and look for a) attending/consulting TRH provider listed and b) the [facility] team listed. Page or secure chat 7A-7P. Log into [email or web address]  and use [facility]'s universal password to access. If you do not have the password, please contact the hospital operator. Locate the [facility] provider you are looking for under [facility] and page to a number that you can be directly reached. If you still have difficulty reaching the provider, please page the [facility] (Director on Call) for the Hospitalists listed on amion for assistance.

## 2023-02-11 DIAGNOSIS — E785 Hyperlipidemia, unspecified: Secondary | ICD-10-CM | POA: Diagnosis present

## 2023-02-11 DIAGNOSIS — M4805 Spinal stenosis, thoracolumbar region: Secondary | ICD-10-CM | POA: Diagnosis present

## 2023-02-11 DIAGNOSIS — F32A Depression, unspecified: Secondary | ICD-10-CM | POA: Diagnosis present

## 2023-02-11 DIAGNOSIS — Z6841 Body Mass Index (BMI) 40.0 and over, adult: Secondary | ICD-10-CM | POA: Diagnosis not present

## 2023-02-11 DIAGNOSIS — M5416 Radiculopathy, lumbar region: Secondary | ICD-10-CM | POA: Diagnosis present

## 2023-02-11 DIAGNOSIS — R600 Localized edema: Secondary | ICD-10-CM | POA: Diagnosis not present

## 2023-02-11 DIAGNOSIS — G8220 Paraplegia, unspecified: Secondary | ICD-10-CM | POA: Diagnosis present

## 2023-02-11 DIAGNOSIS — E1142 Type 2 diabetes mellitus with diabetic polyneuropathy: Secondary | ICD-10-CM | POA: Diagnosis present

## 2023-02-11 DIAGNOSIS — E039 Hypothyroidism, unspecified: Secondary | ICD-10-CM | POA: Diagnosis present

## 2023-02-11 DIAGNOSIS — I2699 Other pulmonary embolism without acute cor pulmonale: Secondary | ICD-10-CM | POA: Diagnosis not present

## 2023-02-11 DIAGNOSIS — Z89511 Acquired absence of right leg below knee: Secondary | ICD-10-CM | POA: Diagnosis not present

## 2023-02-11 DIAGNOSIS — Z79899 Other long term (current) drug therapy: Secondary | ICD-10-CM | POA: Diagnosis not present

## 2023-02-11 DIAGNOSIS — I452 Bifascicular block: Secondary | ICD-10-CM | POA: Diagnosis present

## 2023-02-11 DIAGNOSIS — J9811 Atelectasis: Secondary | ICD-10-CM | POA: Diagnosis present

## 2023-02-11 DIAGNOSIS — Y92009 Unspecified place in unspecified non-institutional (private) residence as the place of occurrence of the external cause: Secondary | ICD-10-CM | POA: Diagnosis not present

## 2023-02-11 DIAGNOSIS — N1831 Chronic kidney disease, stage 3a: Secondary | ICD-10-CM | POA: Diagnosis present

## 2023-02-11 DIAGNOSIS — Z794 Long term (current) use of insulin: Secondary | ICD-10-CM | POA: Diagnosis not present

## 2023-02-11 DIAGNOSIS — E1165 Type 2 diabetes mellitus with hyperglycemia: Secondary | ICD-10-CM | POA: Diagnosis present

## 2023-02-11 DIAGNOSIS — E1122 Type 2 diabetes mellitus with diabetic chronic kidney disease: Secondary | ICD-10-CM | POA: Diagnosis present

## 2023-02-11 DIAGNOSIS — N179 Acute kidney failure, unspecified: Secondary | ICD-10-CM | POA: Diagnosis present

## 2023-02-11 DIAGNOSIS — D631 Anemia in chronic kidney disease: Secondary | ICD-10-CM | POA: Diagnosis present

## 2023-02-11 DIAGNOSIS — T503X1A Poisoning by electrolytic, caloric and water-balance agents, accidental (unintentional), initial encounter: Secondary | ICD-10-CM | POA: Diagnosis present

## 2023-02-11 DIAGNOSIS — Z79890 Hormone replacement therapy (postmenopausal): Secondary | ICD-10-CM | POA: Diagnosis not present

## 2023-02-11 DIAGNOSIS — I152 Hypertension secondary to endocrine disorders: Secondary | ICD-10-CM | POA: Diagnosis present

## 2023-02-11 DIAGNOSIS — Z8249 Family history of ischemic heart disease and other diseases of the circulatory system: Secondary | ICD-10-CM | POA: Diagnosis not present

## 2023-02-11 DIAGNOSIS — Z7901 Long term (current) use of anticoagulants: Secondary | ICD-10-CM | POA: Diagnosis not present

## 2023-02-11 DIAGNOSIS — E11649 Type 2 diabetes mellitus with hypoglycemia without coma: Secondary | ICD-10-CM | POA: Diagnosis not present

## 2023-02-11 LAB — BASIC METABOLIC PANEL
Anion gap: 5 (ref 5–15)
BUN: 20 mg/dL (ref 6–20)
CO2: 27 mmol/L (ref 22–32)
Calcium: 8.2 mg/dL — ABNORMAL LOW (ref 8.9–10.3)
Chloride: 104 mmol/L (ref 98–111)
Creatinine, Ser: 1.46 mg/dL — ABNORMAL HIGH (ref 0.44–1.00)
GFR, Estimated: 45 mL/min — ABNORMAL LOW (ref >60)
Glucose, Bld: 114 mg/dL — ABNORMAL HIGH (ref 70–99)
Potassium: 4.1 mmol/L (ref 3.5–5.1)
Sodium: 136 mmol/L (ref 135–145)

## 2023-02-11 LAB — GLUCOSE, CAPILLARY
Glucose-Capillary: 108 mg/dL — ABNORMAL HIGH (ref 70–99)
Glucose-Capillary: 85 mg/dL (ref 70–99)
Glucose-Capillary: 87 mg/dL (ref 70–99)
Glucose-Capillary: 128 mg/dL — ABNORMAL HIGH (ref 70–99)

## 2023-02-11 MED ORDER — FUROSEMIDE 10 MG/ML IJ SOLN
80.0000 mg | Freq: Two times a day (BID) | INTRAMUSCULAR | Status: DC
  Administered 2023-02-11: 80 mg via INTRAVENOUS
  Filled 2023-02-11: qty 8

## 2023-02-11 MED ORDER — TIZANIDINE HCL 4 MG PO TABS
2.0000 mg | ORAL_TABLET | Freq: Once | ORAL | Status: AC
  Administered 2023-02-11: 2 mg via ORAL
  Filled 2023-02-11: qty 1

## 2023-02-11 NOTE — TOC Progression Note (Signed)
Transition of Care [facility]) - Progression Note    Patient Details  Name: Karen Bennett MRN: [id number] Date of Birth: 1976-07-18  Transition of Care [facility]) CM/SW Contact  Adrian Prows, RN Phone Number: 02/11/2023, 2:22 PM  Clinical Narrative:    [facility] consult for d/c planning; pt asleep in room; no family at bedside; unable to complete TOC assessment; will pass on to oncoming TOC.        Expected Discharge Plan and Services                                               Social Determinants of Health (SDOH) Interventions SDOH Screenings   Food Insecurity: No Food Insecurity (02/09/2023)  Housing: Patient Declined (02/09/2023)  Transportation Needs: No Transportation Needs (02/09/2023)  Utilities: Not At Risk (02/09/2023)  Financial Resource Strain: High Risk (03/30/2021)   Received from [facility], [facility]  Physical Activity: Inactive (03/30/2021)   Received from [facility], [facility]  Social Connections: Unknown (11/24/2021)   Received from [facility], [facility]  Stress: Stress Concern Present (03/30/2021)   Received from [facility], [facility]  Tobacco Use: Low Risk  (02/09/2023)    Readmission Risk Interventions    01/26/2023    3:33 PM 01/16/2023   11:06 AM 02/23/2022   10:38 AM  Readmission Risk Prevention Plan  Transportation Screening Complete Complete Complete  PCP or Specialist Appt within 5-7 Days  Complete   PCP or Specialist Appt within 3-5 Days Complete    Home Care Screening  Complete   Medication Review (RN CM)  Complete   HRI or Home Care Consult Complete  Complete  Social Work Consult for Recovery Care Planning/Counseling Complete  Complete  Palliative Care Screening Not Applicable  Not Applicable  Medication Review Oceanographer) Complete  Complete

## 2023-02-11 NOTE — Plan of Care (Signed)

## 2023-02-11 NOTE — Progress Notes (Signed)
PROGRESS NOTE    Karen Bennett  YHC:[id number] DOB: [birth year] DOA: 02/09/2023 PCP: Karen Rutherford, NP   Brief Narrative:  HPI: [name] is a 47 y.o. female with medical history significant of morbid obesiety. Right BKA. Patient uses wheel chair to be mobile.  Patient reports for the last couple of weeks she is finding it increasingly difficult to lift her legs and get in and out of her wheelchair.  To an extent today that she could not do it at all and missed her appointment with the primary care provider.  Patient ascribes this to "swelling "of the legs and increased weight.  She denies any chest pain or shortness of breath that is new.  She denies any vomiting or diarrhea.  Patient has had upper mid back pain for the last 1 week that is not precipitated by trauma no aggravating or relieving factors identified.   Patient finally came to the ER today due to inability to function at home with her impairment.   ER course is notable for patient receiving Lasix, patient is on room air, patient did not report any shortness of breath to me.  Medical evaluation is sought  Assessment & Plan:   Principal Problem:   Paraplegia (HCC) Active Problems:   Normocytic anemia   History of pulmonary embolism   CKD (chronic kidney disease)  Right leg pain/lumbar neuropathy: Patient has been complaining of right leg pain which started recently.  Unable to lift as much as she used to in the past.  CT thoracolumbar spine shows severe central canal stenosis on the right side at level of L2-L3 and moderate on the left side.  Discussed with the patient that she may benefit from neurosurgery visit as outpatient for possible steroid injections or any other intervention and recommend follow-up with neurosurgery as outpatient.  She verbalizes understanding.  Seen by PT OT, they recommend home health PT.  Type 2 diabetes mellitus: Recent hemoglobin A1c 01/12/2023 was 10.7.  She takes 50 units of long-acting  insulin at home which we have resumed along with SSI.  Blood sugar controlled.  Hyperlipidemia: Continue atorvastatin.  Fluid overload: Patient believes that she has a lot of swelling which she has developed over the course of last 1 month.  She does appear to have edema but at the same time she has a lot of fat tissue due to morbid obesity.  She takes Lasix 20 mg every other day only.  Currently on Lasix 60 mg IV twice daily, has had only 1700 cc output in last 24 hours.  Will increase dose to 80 mg IV twice daily.  She is likely going to require dialysis IV for couple of days.  History of PE: Stable.  Continue Xarelto.  Normocytic anemia: Stable.  Acquired hypothyroidism: Continue Synthroid.  Morbid obesity: Weight loss and dietary modification counseled.  May benefit from following up with the obesity clinic as outpatient.  DVT prophylaxis: SCDs Start: 02/09/23 2202 Xarelto   Code Status: Full Code  Family Communication:  None present at bedside.  Plan of care discussed with patient in length and he/she verbalized understanding and agreed with it.  Status is: Observation The patient will require care spanning > 2 midnights and should be moved to inpatient because: Needs IV diuresis.   Estimated body mass index is 70.46 kg/m as calculated from the following:   Height as of this encounter: 5\' 6"  (1.676 m).   Weight as of this encounter: 198 kg.  Nutritional Assessment: Body mass index is 70.46 kg/m.Marland Kitchen Seen by dietician.  I agree with the assessment and plan as outlined below: Nutrition Status:        . Skin Assessment: I have examined the patient's skin and I agree with the wound assessment as performed by the wound care RN as outlined below:    Consultants:  None  Procedures:  None  Antimicrobials:  Anti-infectives (From admission, onward)    None         Subjective: Patient seen and examined.  She complains of weakness, pain in the upper back and pain in  the fat tissue at the upper posterior chest.  She says that she thinks she has a lot of " knots" under the fat tissue.  On examination, they feel to be lipomas.  She was advised that a lot of times losing weight might help and that if she prefers, she can follow-up with general surgery as outpatient.  I have once again recommended to follow-up with obesity clinic as outpatient.  She asked how newer medications like Wegovy works and I explained to her that as well.  Objective: Vitals:   02/10/23 1317 02/10/23 2124 02/11/23 0607 02/11/23 0705  BP: (!) 122/54 136/66 127/60   Pulse: (!) 55 (!) 59 (!) 54   Resp:  18 18   Temp: 97.7 F (36.5 C) 98.2 F (36.8 C) 98.3 F (36.8 C)   TempSrc: Oral Oral Oral   SpO2: 96% 95% 96%   Weight:    (!) 198 kg  Height:        Intake/Output Summary (Last 24 hours) at 02/11/2023 1243 Last data filed at 02/11/2023 1000 Gross per 24 hour  Intake 363 ml  Output 700 ml  Net -337 ml   Filed Weights   02/10/23 0708 02/10/23 0756 02/11/23 0705  Weight: (!) 195.2 kg (!) 195.2 kg (!) 198 kg    Examination:  General exam: Appears calm and comfortable, morbidly obese Respiratory system: Clear to auscultation. Respiratory effort normal. Cardiovascular system: S1 & S2 heard, RRR. No JVD, murmurs, rubs, gallops or clicks.  +2-3 pitting edema bilateral lower extremities. Gastrointestinal system: Abdomen is nondistended, soft and nontender. No organomegaly or masses felt. Normal bowel sounds heard. Central nervous system: Alert and oriented. No focal neurological deficits. Extremities: Right BKA. Psychiatry: Judgement and insight appear normal. Mood & affect appropriate.   Data Reviewed: I have personally reviewed following labs and imaging studies  CBC: Recent Labs  Lab 02/09/23 1735 02/10/23 0605  WBC 6.4 6.3  NEUTROABS 4.4  --   HGB 11.8* 11.1*  HCT 37.0 35.6*  MCV 88.5 91.5  PLT 298 271   Basic Metabolic Panel: Recent Labs  Lab 02/09/23 1735  02/10/23 0605 02/11/23 0616  NA 131* 133* 136  K 4.3 4.1 4.1  CL 102 105 104  CO2 22 24 27   GLUCOSE 197* 173* 114*  BUN 16 19 20   CREATININE 1.16* 1.28* 1.46*  CALCIUM 8.3* 8.1* 8.2*   GFR: Estimated Creatinine Clearance: 88.2 mL/min (A) (by C-G formula based on SCr of 1.46 mg/dL (H)). Liver Function Tests: Recent Labs  Lab 02/09/23 1735  AST 14*  ALT 8  ALKPHOS 57  BILITOT 1.0  PROT 6.5  ALBUMIN 2.1*   No results for input(s): "LIPASE", "AMYLASE" in the last 168 hours. No results for input(s): "AMMONIA" in the last 168 hours. Coagulation Profile: Recent Labs  Lab 02/10/23 0605  INR 1.6*   Cardiac Enzymes: No results for  input(s): "CKTOTAL", "CKMB", "CKMBINDEX", "TROPONINI" in the last 168 hours. BNP (last 3 results) No results for input(s): "PROBNP" in the last 8760 hours. HbA1C: No results for input(s): "HGBA1C" in the last 72 hours. CBG: Recent Labs  Lab 02/10/23 1141 02/10/23 1604 02/10/23 2123 02/11/23 0716 02/11/23 1139  GLUCAP 100* 79 192* 108* 85   Lipid Profile: Recent Labs    02/10/23 1444  CHOL 214*  HDL 31*  LDLCALC 141*  TRIG 208*  CHOLHDL 6.9   Thyroid Function Tests: No results for input(s): "TSH", "T4TOTAL", "FREET4", "T3FREE", "THYROIDAB" in the last 72 hours. Anemia Panel: Recent Labs    02/10/23 0605  VITAMINB12 136*  FOLATE 8.0  FERRITIN 49  TIBC 183*  IRON 32  RETICCTPCT 2.3   Sepsis Labs: No results for input(s): "PROCALCITON", "LATICACIDVEN" in the last 168 hours.  No results found for this or any previous visit (from the past 240 hour(s)).   Radiology Studies: CT LUMBAR SPINE WO CONTRAST  Result Date: 02/09/2023 CLINICAL DATA:  Metastatic disease for paraplegia. EXAM: CT LUMBAR SPINE WITHOUT CONTRAST TECHNIQUE: Multidetector CT imaging of the lumbar spine was performed without intravenous contrast administration. Multiplanar CT image reconstructions were also generated. RADIATION DOSE REDUCTION: This exam was  performed according to the departmental dose-optimization program which includes automated exposure control, adjustment of the mA and/or kV according to patient size and/or use of iterative reconstruction technique. COMPARISON:  CT angiogram chest 12/26/2022. CT abdomen and pelvis 09/03/2021. FINDINGS: THORACIC SPINE: Alignment: Normal. Vertebrae: No acute fracture or focal pathologic process. Paraspinal and other soft tissues: Negative. No central spinal canal abnormality identified on this noncontrast study. Disc levels: Disc spaces are maintained. There is no central canal or neural foraminal stenosis at any level. Other: Moderate right and small left pleural effusions are present. There is airspace consolidation in the right lower lobe similar to prior. LUMBAR SPINE: Segmentation: 5 lumbar type vertebrae. Alignment: There is 5 mm of anterolisthesis at L5-S1. Alignment is otherwise anatomic. This is unchanged from prior. Vertebrae: No acute fracture or focal osseous lesion. There severe degenerative changes at L5-S1 with vacuum disc phenomena and disc space height loss similar to the prior study. Paraspinal and other soft tissues: Negative. Disc levels: T12-L1: Within normal limits. L1-L2: Bilateral facet arthropathy. Mild bilateral neural foraminal stenosis. No central canal stenosis. L2-L3: Bilateral facet arthropathy and disc bulge. Thickening of the ligamentum flavum. Moderate severe central canal stenosis. Severe right and moderate left neural foraminal stenosis. L3-L4: Broad-based disc bulge and bilateral facet arthropathy. Mild central canal stenosis. Moderate right and mild left neural foraminal stenosis. L4-L5: Bilateral facet arthropathy and disc bulge. Mild central canal stenosis. Severe left and moderate right neural foraminal stenosis. L5-S1: Bilateral facet arthropathy. Moderate right and mild left neural foraminal stenosis. No central canal stenosis. Other: There is calcified atherosclerotic  disease throughout the aorta. Right ureteral stent is present. IMPRESSION: 1. Thoracic spine appears within normal limits. 2. No acute fracture or traumatic subluxation of the lumbar spine. 3. Multilevel degenerative changes of the lumbar spine, most severe at L2-L3 where there is moderate to severe central canal stenosis, severe right and moderate left neural foraminal stenosis. 4. Additional levels of lumbar neural foraminal stenosis and central canal stenosis as described above. 5. Moderate right and small left pleural effusions. Electronically Signed   By: Darliss Cheney M.D.   On: 02/09/2023 23:17   CT THORACIC SPINE WO CONTRAST  Result Date: 02/09/2023 CLINICAL DATA:  Metastatic disease for paraplegia. EXAM: CT LUMBAR  SPINE WITHOUT CONTRAST TECHNIQUE: Multidetector CT imaging of the lumbar spine was performed without intravenous contrast administration. Multiplanar CT image reconstructions were also generated. RADIATION DOSE REDUCTION: This exam was performed according to the departmental dose-optimization program which includes automated exposure control, adjustment of the mA and/or kV according to patient size and/or use of iterative reconstruction technique. COMPARISON:  CT angiogram chest 12/26/2022. CT abdomen and pelvis 09/03/2021. FINDINGS: THORACIC SPINE: Alignment: Normal. Vertebrae: No acute fracture or focal pathologic process. Paraspinal and other soft tissues: Negative. No central spinal canal abnormality identified on this noncontrast study. Disc levels: Disc spaces are maintained. There is no central canal or neural foraminal stenosis at any level. Other: Moderate right and small left pleural effusions are present. There is airspace consolidation in the right lower lobe similar to prior. LUMBAR SPINE: Segmentation: 5 lumbar type vertebrae. Alignment: There is 5 mm of anterolisthesis at L5-S1. Alignment is otherwise anatomic. This is unchanged from prior. Vertebrae: No acute fracture or focal  osseous lesion. There severe degenerative changes at L5-S1 with vacuum disc phenomena and disc space height loss similar to the prior study. Paraspinal and other soft tissues: Negative. Disc levels: T12-L1: Within normal limits. L1-L2: Bilateral facet arthropathy. Mild bilateral neural foraminal stenosis. No central canal stenosis. L2-L3: Bilateral facet arthropathy and disc bulge. Thickening of the ligamentum flavum. Moderate severe central canal stenosis. Severe right and moderate left neural foraminal stenosis. L3-L4: Broad-based disc bulge and bilateral facet arthropathy. Mild central canal stenosis. Moderate right and mild left neural foraminal stenosis. L4-L5: Bilateral facet arthropathy and disc bulge. Mild central canal stenosis. Severe left and moderate right neural foraminal stenosis. L5-S1: Bilateral facet arthropathy. Moderate right and mild left neural foraminal stenosis. No central canal stenosis. Other: There is calcified atherosclerotic disease throughout the aorta. Right ureteral stent is present. IMPRESSION: 1. Thoracic spine appears within normal limits. 2. No acute fracture or traumatic subluxation of the lumbar spine. 3. Multilevel degenerative changes of the lumbar spine, most severe at L2-L3 where there is moderate to severe central canal stenosis, severe right and moderate left neural foraminal stenosis. 4. Additional levels of lumbar neural foraminal stenosis and central canal stenosis as described above. 5. Moderate right and small left pleural effusions. Electronically Signed   By: Darliss Cheney M.D.   On: 02/09/2023 23:17   DG Chest Port 1 View  Result Date: 02/09/2023 CLINICAL DATA:  Shortness of breath. EXAM: PORTABLE CHEST 1 VIEW COMPARISON:  January 24, 2023 FINDINGS: The cardiac silhouette is mildly enlarged and unchanged in size. Mild right basilar atelectasis and/or infiltrate is noted. This is mildly decreased in severity when compared to the prior study. No pleural effusion or  pneumothorax is identified. The visualized skeletal structures are unremarkable. IMPRESSION: Mild right basilar atelectasis and/or infiltrate, mildly decreased in severity when compared to the prior study. Electronically Signed   By: Aram Candela M.D.   On: 02/09/2023 17:19    Scheduled Meds:  atorvastatin  40 mg Oral Daily   buPROPion ER  100 mg Oral Daily   furosemide  80 mg Intravenous BID   insulin aspart  0-15 Units Subcutaneous TID WC   insulin aspart  0-5 Units Subcutaneous QHS   insulin glargine-yfgn  50 Units Subcutaneous QHS   levothyroxine  50 mcg Oral Q0600   Rivaroxaban  15 mg Oral BID WC   Followed by   Melene Muller ON 02/16/2023] rivaroxaban  20 mg Oral Q supper   sodium chloride flush  3 mL Intravenous Q12H  traZODone  150 mg Oral QHS   Continuous Infusions:   LOS: 0 days   Hughie Closs, MD [facility]  02/11/2023, 12:43 PM   *Please note that this is a verbal dictation therefore any spelling or grammatical errors are due to the "[employer] One" system interpretation.  Please page via Amion and do not message via secure chat for urgent patient care matters. Secure chat can be used for non urgent patient care matters.  How to contact the [facility] Attending or Consulting provider 7A - 7P or covering provider during after hours 7P -7A, for this patient?  Check the care team in [facility] and look for a) attending/consulting [facility] provider listed and b) the [facility] team listed. Page or secure chat 7A-7P. Log into [email or web address] and use [place]'s universal password to access. If you do not have the password, please contact the hospital operator. Locate the [facility] provider you are looking for under [facility] and page to a number that you can be directly reached. If you still have difficulty reaching the provider, please page the [facility] (Director on Call) for the Hospitalists listed on amion for assistance.

## 2023-02-12 DIAGNOSIS — G8220 Paraplegia, unspecified: Secondary | ICD-10-CM | POA: Diagnosis not present

## 2023-02-12 LAB — GLUCOSE, CAPILLARY
Glucose-Capillary: 113 mg/dL — ABNORMAL HIGH (ref 70–99)
Glucose-Capillary: 76 mg/dL (ref 70–99)
Glucose-Capillary: 127 mg/dL — ABNORMAL HIGH (ref 70–99)
Glucose-Capillary: 112 mg/dL — ABNORMAL HIGH (ref 70–99)

## 2023-02-12 LAB — BASIC METABOLIC PANEL
Anion gap: 7 (ref 5–15)
BUN: 22 mg/dL — ABNORMAL HIGH (ref 6–20)
CO2: 23 mmol/L (ref 22–32)
Calcium: 7.9 mg/dL — ABNORMAL LOW (ref 8.9–10.3)
Chloride: 103 mmol/L (ref 98–111)
Creatinine, Ser: 1.63 mg/dL — ABNORMAL HIGH (ref 0.44–1.00)
GFR, Estimated: 39 mL/min — ABNORMAL LOW (ref >60)
Glucose, Bld: 90 mg/dL (ref 70–99)
Potassium: 4.2 mmol/L (ref 3.5–5.1)
Sodium: 133 mmol/L — ABNORMAL LOW (ref 135–145)

## 2023-02-12 MED ORDER — FUROSEMIDE 10 MG/ML IJ SOLN
8.0000 mg/h | INTRAVENOUS | Status: DC
  Administered 2023-02-12 – 2023-02-15 (×4): 8 mg/h via INTRAVENOUS
  Filled 2023-02-12 (×5): qty 20

## 2023-02-12 MED ORDER — CYCLOBENZAPRINE HCL 5 MG PO TABS
5.0000 mg | ORAL_TABLET | Freq: Three times a day (TID) | ORAL | Status: AC | PRN
  Administered 2023-02-12 – 2023-02-14 (×4): 5 mg via ORAL
  Filled 2023-02-12 (×4): qty 1

## 2023-02-12 NOTE — Progress Notes (Signed)
PROGRESS NOTE    Karen Bennett  ZOX:[id number] DOB: 1976/01/18 DOA: 02/09/2023 PCP: [name], NP   Brief Narrative:  HPI: Karen Bennett is a 47 y.o. female with medical history significant of morbid obesiety. Right BKA. Patient uses wheel chair to be mobile.  Patient reports for the last couple of weeks she is finding it increasingly difficult to lift her legs and get in and out of her wheelchair.  To an extent today that she could not do it at all and missed her appointment with the primary care provider.  Patient ascribes this to "swelling "of the legs and increased weight.  She denies any chest pain or shortness of breath that is new.  She denies any vomiting or diarrhea.  Patient has had upper mid back pain for the last 1 week that is not precipitated by trauma no aggravating or relieving factors identified.   Patient finally came to the ER today due to inability to function at home with her impairment.   ER course is notable for patient receiving Lasix, patient is on room air, patient did not report any shortness of breath to me.  Medical evaluation is sought  Assessment & Plan:   Principal Problem:   Paraplegia (HCC) Active Problems:   Normocytic anemia   History of pulmonary embolism   CKD (chronic kidney disease)  Right leg pain/lumbar neuropathy: Patient has been complaining of right leg pain which started recently.  Unable to lift as much as she used to in the past.  CT thoracolumbar spine shows severe central canal stenosis on the right side at level of L2-L3 and moderate on the left side.  Discussed with the patient that she may benefit from neurosurgery visit as outpatient for possible steroid injections or any other intervention and recommend follow-up with neurosurgery as outpatient.  She verbalizes understanding.  Seen by PT OT, they recommend home health PT.  Type 2 diabetes mellitus: Recent hemoglobin A1c 01/12/2023 was 10.7.  She takes 50 units of long-acting  insulin at home which we have resumed along with SSI.  Blood sugar controlled.  Hyperlipidemia: Continue atorvastatin.  Fluid overload: Patient believes that she has a lot of swelling which she has developed over the course of last 1 month.  She does appear to have edema but at the same time she has a lot of fat tissue due to morbid obesity.  She takes Lasix 20 mg every other day only.  Currently on Lasix 80 mg IV twice daily, has had only 1900 cc output in last 24 hours.  But on the other side, her edema has improved and she also agrees with that.  We will now transition her to Lasix drip for another 24 hours for more diuresis and reassess tomorrow morning.  History of PE: Stable.  Continue Xarelto.  Normocytic anemia: Stable.  Acquired hypothyroidism: Continue Synthroid.  Morbid obesity: Weight loss and dietary modification counseled.  May benefit from following up with the obesity clinic as outpatient.  DVT prophylaxis: SCDs Start: 02/09/23 2202 Xarelto   Code Status: Full Code  Family Communication:  None present at bedside.  Plan of care discussed with patient in length and he/she verbalized understanding and agreed with it.  Status is: Inpatient Remains inpatient appropriate because: Still fluid overloaded, needs IV diuresis.     Estimated body mass index is 69.42 kg/m as calculated from the following:   Height as of this encounter: 5\' 6"  (1.676 m).   Weight as of this  encounter: 195.1 kg.    Nutritional Assessment: Body mass index is 69.42 kg/m.Marland Kitchen Seen by dietician.  I agree with the assessment and plan as outlined below: Nutrition Status:        . Skin Assessment: I have examined the patient's skin and I agree with the wound assessment as performed by the wound care RN as outlined below:    Consultants:  None  Procedures:  None  Antimicrobials:  Anti-infectives (From admission, onward)    None         Subjective: Seen and examined.  No complaints  today.  Feels better.  Objective: Vitals:   02/11/23 2144 02/12/23 0500 02/12/23 0555 02/12/23 1013  BP: (!) 125/56  (!) 128/59 (!) 128/50  Pulse: 62  (!) 57   Resp: 18  18   Temp: 98.5 F (36.9 C)  98.6 F (37 C)   TempSrc: Oral  Oral   SpO2: 90%  96%   Weight:  (!) 195.1 kg    Height:        Intake/Output Summary (Last 24 hours) at 02/12/2023 1043 Last data filed at 02/12/2023 0430 Gross per 24 hour  Intake 1080 ml  Output 1900 ml  Net -820 ml   Filed Weights   02/10/23 0756 02/11/23 0705 02/12/23 0500  Weight: (!) 195.2 kg (!) 198 kg (!) 195.1 kg    Examination:  General exam: Appears calm and comfortable, morbidly obese Respiratory system: Clear to auscultation. Respiratory effort normal. Cardiovascular system: S1 & S2 heard, RRR. No JVD, murmurs, rubs, gallops or clicks.  +1-2 pitting edema bilateral lower extremities. Gastrointestinal system: Abdomen is nondistended, soft and nontender. No organomegaly or masses felt. Normal bowel sounds heard. Central nervous system: Alert and oriented. No focal neurological deficits. Extremities: Symmetric 5 x 5 power. Skin: No rashes, lesions or ulcers.  Psychiatry: Judgement and insight appear normal. Mood & affect appropriate.    Data Reviewed: I have personally reviewed following labs and imaging studies  CBC: Recent Labs  Lab 02/09/23 1735 02/10/23 0605  WBC 6.4 6.3  NEUTROABS 4.4  --   HGB 11.8* 11.1*  HCT 37.0 35.6*  MCV 88.5 91.5  PLT 298 271   Basic Metabolic Panel: Recent Labs  Lab 02/09/23 1735 02/10/23 0605 02/11/23 0616 02/12/23 0509  NA 131* 133* 136 133*  K 4.3 4.1 4.1 4.2  CL 102 105 104 103  CO2 22 24 27 23   GLUCOSE 197* 173* 114* 90  BUN 16 19 20  22*  CREATININE 1.16* 1.28* 1.46* 1.63*  CALCIUM 8.3* 8.1* 8.2* 7.9*   GFR: Estimated Creatinine Clearance: 78.2 mL/min (A) (by C-G formula based on SCr of 1.63 mg/dL (H)). Liver Function Tests: Recent Labs  Lab 02/09/23 1735  AST 14*  ALT  8  ALKPHOS 57  BILITOT 1.0  PROT 6.5  ALBUMIN 2.1*   No results for input(s): "LIPASE", "AMYLASE" in the last 168 hours. No results for input(s): "AMMONIA" in the last 168 hours. Coagulation Profile: Recent Labs  Lab 02/10/23 0605  INR 1.6*   Cardiac Enzymes: No results for input(s): "CKTOTAL", "CKMB", "CKMBINDEX", "TROPONINI" in the last 168 hours. BNP (last 3 results) No results for input(s): "PROBNP" in the last 8760 hours. HbA1C: No results for input(s): "HGBA1C" in the last 72 hours. CBG: Recent Labs  Lab 02/11/23 0716 02/11/23 1139 02/11/23 1710 02/11/23 2143 02/12/23 0737  GLUCAP 108* 85 87 128* 113*   Lipid Profile: Recent Labs    02/10/23 1444  CHOL 214*  HDL 31*  LDLCALC 141*  TRIG 208*  CHOLHDL 6.9   Thyroid Function Tests: No results for input(s): "TSH", "T4TOTAL", "FREET4", "T3FREE", "THYROIDAB" in the last 72 hours. Anemia Panel: Recent Labs    02/10/23 0605  VITAMINB12 136*  FOLATE 8.0  FERRITIN 49  TIBC 183*  IRON 32  RETICCTPCT 2.3   Sepsis Labs: No results for input(s): "PROCALCITON", "LATICACIDVEN" in the last 168 hours.  No results found for this or any previous visit (from the past 240 hour(s)).   Radiology Studies: No results found.  Scheduled Meds:  atorvastatin  40 mg Oral Daily   buPROPion ER  100 mg Oral Daily   insulin aspart  0-15 Units Subcutaneous TID WC   insulin aspart  0-5 Units Subcutaneous QHS   insulin glargine-yfgn  50 Units Subcutaneous QHS   levothyroxine  50 mcg Oral Q0600   Rivaroxaban  15 mg Oral BID WC   Followed by   Melene Muller ON 02/16/2023] rivaroxaban  20 mg Oral Q supper   sodium chloride flush  3 mL Intravenous Q12H   traZODone  150 mg Oral QHS   Continuous Infusions:  furosemide (LASIX) 200 mg in dextrose 5 % 100 mL (2 mg/mL) infusion 8 mg/hr (02/12/23 1014)     LOS: 1 day   Hughie Closs, MD [facility]  02/12/2023, 10:43 AM   *Please note that this is a verbal dictation therefore  any spelling or grammatical errors are due to the "Dragon Medical One" system interpretation.  Please page via Amion and do not message via secure chat for urgent patient care matters. Secure chat can be used for non urgent patient care matters.  How to contact the [facility] Attending or Consulting provider 7A - 7P or covering provider during after hours 7P -7A, for this patient?  Check the care team in [facility] and look for a) attending/consulting TRH provider listed and b) the [facility] team listed. Page or secure chat 7A-7P. Log into [email or web address] and use [employer]'s universal password to access. If you do not have the password, please contact the hospital operator. Locate the [facility] provider you are looking for under [facility] and page to a number that you can be directly reached. If you still have difficulty reaching the provider, please page the [facility] (Director on Call) for the Hospitalists listed on amion for assistance.

## 2023-02-12 NOTE — Progress Notes (Addendum)
Physical Therapy Treatment Patient Details Name: Karen Bennett MRN: [id number] DOB: November 12, 1975 Today's Date: 02/12/2023   History of Present Illness Patient is a 47 year old female presenting to [facility] ED on 7/19 with complaints of RLE pain and weakness. CT thoracolumbar spine shows severe central canal stenosis on the right side at level of L2-L3 and moderate on the left side. Of note recently hospitalized for pneumonia. PMH: DM, Obesity, HTN, Rt BKA, CKD3, HLD, hypothyroidism, hx of PE, CHF, R-BKA.    PT Comments  Pt admitted with above diagnosis.  Pt currently with functional limitations due to the deficits listed below (see PT Problem List). Pt in bed when PT arrived. Pt agreeable to therapy intervention. Pt will benefit from use of bariatric recliner, none available at time of tx session. Pt expressed concern and became emotional due to required assist for AP/scoot transfer bed to standard recliner. Pt is S for bed mobility tasks, encouraged to sit up in bed as able, A x 2 for safety with transfer task bed to recliner with min A x 2 and recliner to bed with mod A x 1. Pt required intermittent therapeutic rest breaks due to fatigue and SOB, O2 saturation decreased to 88% with exertion requiring cues for pursed lip breathing and 38s to recover. Pt left in bed all needs in place.  Pt will benefit from acute skilled PT to increase their independence and safety with mobility to allow discharge.        Assistance Recommended at Discharge Intermittent Supervision/Assistance  If plan is discharge home, recommend the following:  Can travel by private vehicle    Help with stairs or ramp for entrance;Assist for transportation;Two people to help with walking and/or transfers;Two people to help with bathing/dressing/bathroom      Equipment Recommendations  Other (comment) (Hoyer lift if going home and unable to perform transfer to power chair at mod I level)    Recommendations for Other Services        Precautions / Restrictions Precautions Precautions: Fall Precaution Comments: Hx of R-BKA, WC bound at baseline (power chair)` Restrictions Weight Bearing Restrictions: No     Mobility  Bed Mobility Overal bed mobility: Needs Assistance Bed Mobility: Supine to Sit, Sit to Supine, Rolling Rolling: Supervision   Supine to sit: Supervision, HOB elevated Sit to supine: Supervision   General bed mobility comments: pt is able to perform long sit in bed with S, roll side to side with S and momentum like technique    Transfers Overall transfer level: Needs assistance Equipment used: None Transfers: Bed to chair/wheelchair/BSC         Anterior-Posterior transfers: Mod assist, +2 physical assistance, +2 safety/equipment   General transfer comment: level surface AP scoot from bed to standard recliner with min A x 2 to compelte scoot to recliner and mod A x 1 for assist to scoot to return to bed with increased time, use of bed pad, cues for safety and hand placement with pt encouraged to use bed rail, cues for pursed lip breathing with pt exhibiting S and S of SOB with O2 saturation 88% with exertion and pt required 38s to recover to 92% on RA    Ambulation/Gait               General Gait Details: Power chair at baseline   Stairs             Wheelchair Mobility     Tilt Bed    Modified Rankin (Stroke  Patients Only)       Balance Overall balance assessment: Needs assistance Sitting-balance support:  (No UE support and LLE supported) Sitting balance-Leahy Scale: Good                                      Cognition Arousal/Alertness: Awake/alert Behavior During Therapy: WFL for tasks assessed/performed Overall Cognitive Status: Within Functional Limits for tasks assessed                                 General Comments: Pt remains tearful about difficulty with mobilization        Exercises      General Comments  General comments (skin integrity, edema, etc.): pt reported that her skin felt tight and itchy      Pertinent Vitals/Pain Pain Assessment Pain Assessment: 0-10 Pain Score: 3  Pain Location: bilateral legs, back Pain Descriptors / Indicators: Pressure, Tender Pain Intervention(s): Limited activity within patient's tolerance, Monitored during session, Repositioned    Home Living                          Prior Function            PT Goals (current goals can now be found in the care plan section) Acute Rehab PT Goals Patient Stated Goal: To go home and be able to transfer PT Goal Formulation: With patient Time For Goal Achievement: 02/24/23 Potential to Achieve Goals: Fair Progress towards PT goals: Progressing toward goals    Frequency    Min 1X/week      PT Plan Current plan remains appropriate    Co-evaluation              AM-PAC PT "6 Clicks" Mobility   Outcome Measure  Help needed turning from your back to your side while in a flat bed without using bedrails?: None Help needed moving from lying on your back to sitting on the side of a flat bed without using bedrails?: A Little Help needed moving to and from a bed to a chair (including a wheelchair)?: A Lot Help needed standing up from a chair using your arms (e.g., wheelchair or bedside chair)?: Total Help needed to walk in hospital room?: Total Help needed climbing 3-5 steps with a railing? : Total 6 Click Score: 12    End of Session   Activity Tolerance: Patient limited by pain Patient left: in bed;with call bell/phone within reach;with nursing/sitter in room Nurse Communication: Mobility status;Need for lift equipment (Pt will require hoyer to bariatric recliner at this time) PT Visit Diagnosis: Pain;Muscle weakness (generalized) (M62.81) Pain - Right/Left: Left (bilateral) Pain - part of body: Leg;Knee;Ankle and joints of foot     Time: 1222-1253 PT Time Calculation (min) (ACUTE ONLY):  31 min  Charges:    $Therapeutic Activity: 23-37 mins PT General Charges $$ ACUTE PT VISIT: 1 Visit                     Johnny Bridge, PT Acute Rehab   Jacqualyn Posey 02/12/2023, 1:30 PM

## 2023-02-13 DIAGNOSIS — G8220 Paraplegia, unspecified: Secondary | ICD-10-CM | POA: Diagnosis not present

## 2023-02-13 LAB — BASIC METABOLIC PANEL
Anion gap: 8 (ref 5–15)
BUN: 25 mg/dL — ABNORMAL HIGH (ref 6–20)
CO2: 23 mmol/L (ref 22–32)
Calcium: 8 mg/dL — ABNORMAL LOW (ref 8.9–10.3)
Chloride: 103 mmol/L (ref 98–111)
Creatinine, Ser: 1.95 mg/dL — ABNORMAL HIGH (ref 0.44–1.00)
GFR, Estimated: 32 mL/min — ABNORMAL LOW (ref >60)
Glucose, Bld: 74 mg/dL (ref 70–99)
Potassium: 5 mmol/L (ref 3.5–5.1)
Sodium: 134 mmol/L — ABNORMAL LOW (ref 135–145)

## 2023-02-13 LAB — GLUCOSE, CAPILLARY
Glucose-Capillary: 68 mg/dL — ABNORMAL LOW (ref 70–99)
Glucose-Capillary: 68 mg/dL — ABNORMAL LOW (ref 70–99)
Glucose-Capillary: 67 mg/dL — ABNORMAL LOW (ref 70–99)
Glucose-Capillary: 91 mg/dL (ref 70–99)
Glucose-Capillary: 85 mg/dL (ref 70–99)
Glucose-Capillary: 82 mg/dL (ref 70–99)
Glucose-Capillary: 113 mg/dL — ABNORMAL HIGH (ref 70–99)

## 2023-02-13 NOTE — Care Plan (Signed)
Blood sugar 68 this morning. Pt provided with juice. Blood sugar now 67. Rechecked sugar after breakfast. Glucose now 91. Dr. Jacqulyn Bath made aware.

## 2023-02-13 NOTE — Progress Notes (Signed)
PT Note  Patient Details Name: Karen Bennett MRN: [id number] DOB: 1975-09-02   Cancelled Treatment:    Reason Eval/Treat Not Completed: Patient declined, pt reported nausea and fatigue this am. Nurse made aware. PT to return for intervention as time allows.   Johnny Bridge, PT Acute Rehab   Jacqualyn Posey 02/13/2023, 11:30 AM

## 2023-02-13 NOTE — Progress Notes (Signed)
PROGRESS NOTE    Karen Bennett  WUJ:[id number] DOB: 1975-09-24 DOA: 02/09/2023 PCP: [name], NP   Brief Narrative:  HPI: Karen Bennett is a 47 y.o. female with medical history significant of morbid obesiety. Right BKA. Patient uses wheel chair to be mobile.  Patient reports for the last couple of weeks she is finding it increasingly difficult to lift her legs and get in and out of her wheelchair.  To an extent today that she could not do it at all and missed her appointment with the primary care provider.  Patient ascribes this to "swelling "of the legs and increased weight.  She denies any chest pain or shortness of breath that is new.  She denies any vomiting or diarrhea.  Patient has had upper mid back pain for the last 1 week that is not precipitated by trauma no aggravating or relieving factors identified.   Patient finally came to the ER today due to inability to function at home with her impairment.   ER course is notable for patient receiving Lasix, patient is on room air, patient did not report any shortness of breath to me.  Medical evaluation is sought  Assessment & Plan:   Principal Problem:   Paraplegia (HCC) Active Problems:   Normocytic anemia   History of pulmonary embolism   CKD (chronic kidney disease)  Right leg pain/lumbar neuropathy: Patient has been complaining of right leg pain which started recently.  Unable to lift as much as she used to in the past.  CT thoracolumbar spine shows severe central canal stenosis on the right side at level of L2-L3 and moderate on the left side.  Discussed with the patient that she may benefit from neurosurgery visit as outpatient for possible steroid injections or any other intervention and recommend follow-up with neurosurgery as outpatient.  She verbalizes understanding.  Seen by PT OT, they recommend home health PT.  Type 2 diabetes mellitus: Recent hemoglobin A1c 01/12/2023 was 10.7.  She takes 50 units of long-acting  insulin at home which we have resumed along with SSI.  Blood sugar controlled.  Hyperlipidemia: Continue atorvastatin.  Fluid overload: Patient believes that she has a lot of swelling which she has developed over the course of last 1 month.  She does appear to have edema but at the same time she has a lot of fat tissue due to morbid obesity.  She takes Lasix 20 mg every other day only.  Currently on Lasix drip, has had only 1 L of urine output in last 24 hours with net -2.8 L.  Edema improving but very slowly.  Will continue Lasix drip for another day and reassess tomorrow morning.  History of PE: Stable.  Continue Xarelto.  Normocytic anemia: Stable.  Acquired hypothyroidism: Continue Synthroid.  Morbid obesity: Weight loss and dietary modification counseled.  May benefit from following up with the obesity clinic as outpatient.  DVT prophylaxis: SCDs Start: 02/09/23 2202 Xarelto   Code Status: Full Code  Family Communication:  None present at bedside.  Plan of care discussed with patient in length and he/she verbalized understanding and agreed with it.  Status is: Inpatient Remains inpatient appropriate because: Still fluid overloaded, needs IV diuresis.     Estimated body mass index is 70.99 kg/m as calculated from the following:   Height as of this encounter: 5\' 6"  (1.676 m).   Weight as of this encounter: 199.5 kg.    Nutritional Assessment: Body mass index is 70.99 kg/m.Marland Kitchen Seen by  dietician.  I agree with the assessment and plan as outlined below: Nutrition Status:        . Skin Assessment: I have examined the patient's skin and I agree with the wound assessment as performed by the wound care RN as outlined below:    Consultants:  None  Procedures:  None  Antimicrobials:  Anti-infectives (From admission, onward)    None         Subjective: Patient seen and examined.  No complaints.  Objective: Vitals:   02/12/23 1013 02/12/23 1359 02/12/23 2123  02/13/23 0556  BP: (!) 128/50 (!) 146/52 (!) 117/52 (!) 115/58  Pulse:  60 60 (!) 58  Resp:  18 14 14   Temp:  98.4 F (36.9 C) 98.4 F (36.9 C) 97.9 F (36.6 C)  TempSrc:  Oral Oral Oral  SpO2:  93% 92% 96%  Weight:    (!) 199.5 kg  Height:        Intake/Output Summary (Last 24 hours) at 02/13/2023 1130 Last data filed at 02/13/2023 0900 Gross per 24 hour  Intake 758 ml  Output 1050 ml  Net -292 ml   Filed Weights   02/11/23 0705 02/12/23 0500 02/13/23 0556  Weight: (!) 198 kg (!) 195.1 kg (!) 199.5 kg    Examination:  General exam: Appears calm and comfortable, morbidly Respiratory system: Clear to auscultation. Respiratory effort normal. Cardiovascular system: S1 & S2 heard, RRR. No JVD, murmurs, rubs, gallops or clicks.  +2 edema bilateral lower extremity. Gastrointestinal system: Abdomen is nondistended, soft and nontender. No organomegaly or masses felt. Normal bowel sounds heard. Central nervous system: Alert and oriented. No focal neurological deficits. Extremities: Right BKA Psychiatry: Judgement and insight appear normal. Mood & affect appropriate.   Data Reviewed: I have personally reviewed following labs and imaging studies  CBC: Recent Labs  Lab 02/09/23 1735 02/10/23 0605  WBC 6.4 6.3  NEUTROABS 4.4  --   HGB 11.8* 11.1*  HCT 37.0 35.6*  MCV 88.5 91.5  PLT 298 271   Basic Metabolic Panel: Recent Labs  Lab 02/09/23 1735 02/10/23 0605 02/11/23 0616 02/12/23 0509 02/13/23 0738  NA 131* 133* 136 133* 134*  K 4.3 4.1 4.1 4.2 5.0  CL 102 105 104 103 103  CO2 22 24 27 23 23   GLUCOSE 197* 173* 114* 90 74  BUN 16 19 20  22* 25*  CREATININE 1.16* 1.28* 1.46* 1.63* 1.95*  CALCIUM 8.3* 8.1* 8.2* 7.9* 8.0*   GFR: Estimated Creatinine Clearance: 66.4 mL/min (A) (by C-G formula based on SCr of 1.95 mg/dL (H)). Liver Function Tests: Recent Labs  Lab 02/09/23 1735  AST 14*  ALT 8  ALKPHOS 57  BILITOT 1.0  PROT 6.5  ALBUMIN 2.1*   No results for  input(s): "LIPASE", "AMYLASE" in the last 168 hours. No results for input(s): "AMMONIA" in the last 168 hours. Coagulation Profile: Recent Labs  Lab 02/10/23 0605  INR 1.6*   Cardiac Enzymes: No results for input(s): "CKTOTAL", "CKMB", "CKMBINDEX", "TROPONINI" in the last 168 hours. BNP (last 3 results) No results for input(s): "PROBNP" in the last 8760 hours. HbA1C: No results for input(s): "HGBA1C" in the last 72 hours. CBG: Recent Labs  Lab 02/12/23 2126 02/13/23 0815 02/13/23 0846 02/13/23 0935 02/13/23 1020  GLUCAP 112* 68* 68* 67* 91   Lipid Profile: Recent Labs    02/10/23 1444  CHOL 214*  HDL 31*  LDLCALC 141*  TRIG 208*  CHOLHDL 6.9   Thyroid Function Tests: No results  for input(s): "TSH", "T4TOTAL", "FREET4", "T3FREE", "THYROIDAB" in the last 72 hours. Anemia Panel: No results for input(s): "VITAMINB12", "FOLATE", "FERRITIN", "TIBC", "IRON", "RETICCTPCT" in the last 72 hours.  Sepsis Labs: No results for input(s): "PROCALCITON", "LATICACIDVEN" in the last 168 hours.  No results found for this or any previous visit (from the past 240 hour(s)).   Radiology Studies: No results found.  Scheduled Meds:  atorvastatin  40 mg Oral Daily   buPROPion ER  100 mg Oral Daily   insulin aspart  0-15 Units Subcutaneous TID WC   insulin aspart  0-5 Units Subcutaneous QHS   insulin glargine-yfgn  50 Units Subcutaneous QHS   levothyroxine  50 mcg Oral Q0600   Rivaroxaban  15 mg Oral BID WC   Followed by   Melene Muller ON 02/16/2023] rivaroxaban  20 mg Oral Q supper   sodium chloride flush  3 mL Intravenous Q12H   traZODone  150 mg Oral QHS   Continuous Infusions:  furosemide (LASIX) 200 mg in dextrose 5 % 100 mL (2 mg/mL) infusion 8 mg/hr (02/13/23 0412)     LOS: 2 days   Hughie Closs, MD [facility]  02/13/2023, 11:30 AM   *Please note that this is a verbal dictation therefore any spelling or grammatical errors are due to the "[employer]agon [employer] One" system  interpretation.  Please page via Amion and do not message via secure chat for urgent patient care matters. Secure chat can be used for non urgent patient care matters.  How to contact the [facility] Attending or Consulting provider 7A - 7P or covering provider during after hours 7P -7A, for this patient?  Check the care team in [facility] and look for a) attending/consulting TRH provider listed and b) the [facility] team listed. Page or secure chat 7A-7P. Log into [email or web address] and use [employer]'s universal password to access. If you do not have the password, please contact the hospital operator. Locate the [facility] provider you are looking for under [facility] and page to a number that you can be directly reached. If you still have difficulty reaching the provider, please page the [facility] (Director on Call) for the Hospitalists listed on amion for assistance.

## 2023-02-13 NOTE — Progress Notes (Signed)
PT Note  Patient Details Name: Karen Bennett MRN: [id number] DOB: 29-Jun-1976   Cancelled Treatment:    Reason Eval/Treat Not Completed: Patient declined in PM, due to ongoing nausea and reports of fatigue. Will attempt intervention 7/24.  Johnny Bridge, PT Acute Rehab   Jacqualyn Posey 02/13/2023, 3:47 PM

## 2023-02-13 NOTE — TOC Initial Note (Signed)
Transition of Care [facility]) - Initial/Assessment Note    Patient Details  Name: Karen Bennett MRN: [id number] Date of Birth: [birth year]  Transition of Care [facility] CM/SW Contact:    Beckie Busing, RN Phone [phone]  02/13/2023, 4:09 PM  Clinical Narrative:                 TOC following patient with high risk for readmission. Patient states that she comes from home where she has PCS services 5 days per week for 6-8 hours per day. Patient states that she is able to function and transfer independently. Patient states that she does have a PCP and follows on a regular basis. Patient states that she will be able tonmove better once the swelling goes down and is not sure that she will need home PT.  Expected Discharge Plan: Home w Home Health Services Barriers to Discharge: Continued Medical Work up   Patient Goals and CMS Choice Patient states their goals for this hospitalization and ongoing recovery are:: Wants to get the swelling down in order to continue to be independent CMS Medicare.gov Compare Post Acute Care list provided to:: Patient Choice offered to / list presented to : Patient (patient states that she already has PCS services and will be fine once swelling has been decreased.) Real ownership interest in [facility].provided to:: Patient    Expected Discharge Plan and Services In-house Referral: NA Discharge Planning Services: CM Consult Post Acute Care Choice: Resumption of Svcs/PTA Provider, Durable Medical Equipment Living arrangements for the past 2 months: Apartment                 DME Arranged:  Michiel [employer]) DME Agency: [employer] Date DME Agency Contacted: 02/13/23 Time DME Agency Contacted: 1145 Representative spoke with at DME Agency: Shaune Leeks HH Arranged: Refused HH (Patient states that she will be fine once her swelling is down in her legs.) HH Agency: NA        Prior Living Arrangements/Services Living arrangements  for the past 2 months: Apartment Lives with:: Self Patient language and need for interpreter reviewed:: Yes Do you feel safe going back to the place where you live?: Yes      Need for Family Participation in Patient Care: Yes (Comment) Care giver support system in place?: Yes (comment) Current home services: DME, Homehealth aide (DME 3in 1, wheelchair, Has PCS aide for 6-8 houd per day.) Criminal Activity/Legal Involvement Pertinent to Current Situation/Hospitalization: No - Comment as needed  Activities of Daily Living Home Assistive Devices/Equipment: Wheelchair ADL Screening (condition at time of admission) Patient's cognitive ability adequate to safely complete daily activities?: Yes Is the patient deaf or have difficulty hearing?: No Does the patient have difficulty seeing, even when wearing glasses/contacts?: No Does the patient have difficulty concentrating, remembering, or making decisions?: No Patient able to express need for assistance with ADLs?: Yes Does the patient have difficulty dressing or bathing?: Yes Independently performs ADLs?: Yes (appropriate for developmental age) Does the patient have difficulty walking or climbing stairs?: Yes Weakness of Legs: Left Weakness of Arms/Hands: Both  Permission Sought/Granted Permission sought to share information with : Case Manager Permission granted to share information with : Yes, Release of Information Signed  Share Information with NAME: Case Manager     Permission granted to share info w Relationship: Jarold Song [phone]     Emotional Assessment Appearance:: Appears stated age Attitude/Demeanor/Rapport: Gracious Affect (typically observed): Accepting Orientation: : Oriented to Self, Oriented to Place,  Oriented to  Time, Oriented to Situation Alcohol / Substance Use: Not Applicable Psych Involvement: No (comment)  Admission diagnosis:  Paraplegia (HCC) [G82.20] Shortness of breath [R06.02] Lower extremity  edema [R60.0] Pulmonary embolism, other, unspecified chronicity, unspecified whether acute cor pulmonale present (HCC) [I26.99] Patient Active Problem List   Diagnosis Date Noted   CKD (chronic kidney disease) 02/09/2023   Paraplegia (HCC) 02/09/2023   Acute pulmonary embolism (HCC) 01/25/2023   Acute on chronic heart failure with preserved ejection fraction (HFpEF) (HCC) 01/11/2023   Chronic kidney disease, stage 3a (HCC) 01/11/2023   Acute pain of right lower extremity 07/04/2022   History of femur fracture 02/23/2022   Bacteremia 02/22/2022   Hypothyroidism    Hyperglycemia 10/24/2021   Hx of bacterial endocarditis 10/24/2021   Pyuria 10/24/2021   Candida rash of groin 09/11/2021   Pressure injury of back, stage 2 (HCC) 09/05/2021   Obstruction of right ureteropelvic junction (UPJ) due to stone 09/04/2021   Sepsis secondary to UTI (HCC) 09/03/2021   Diabetic hyperosmolar non-ketotic state (HCC) 09/03/2021   Stenosis of subclavian artery (HCC) 02/09/2021   Sepsis (HCC) 02/01/2021   Mixed hyperlipidemia 01/12/2021   Infective endocarditis    Pressure injury of skin 12/28/2020   BMI 60.0-69.9, adult (HCC) 12/27/2020   Hypertension associated with diabetes (HCC) 12/27/2020   History of pulmonary embolism 12/27/2020   Necrotizing fasciitis of lower leg (HCC) Thigh  12/27/2020   Cellulitis 12/26/2020   History of DVT (deep vein thrombosis) 11/12/2020   Tobacco abuse 12/31/2017   Hx of right BKA (HCC) 12/31/2017   Closed fracture of right distal femur (HCC) 12/31/2017   Hirsutism 06/21/2016   Noncompliance with treatment plan 04/19/2015   Anxiety and depression 06/26/2013   Normocytic anemia 05/22/2013   Depression 05/22/2013   Grief reaction 05/22/2013   AKI (acute kidney injury) (HCC) 05/07/2013   Phantom limb pain (HCC) 05/06/2013   Hyperlipidemia associated with type 2 diabetes mellitus (HCC) 05/02/2013   Diabetic peripheral neuropathy associated with type 2 diabetes  mellitus (HCC) 04/30/2013    Class: Chronic   MSSA (methicillin susceptible Staphylococcus aureus) 09/06/2012   Foot osteomyelitis, left (HCC) 09/03/2012   Pulmonary emboli (HCC) 11/11/2011   Hyponatremia 09/05/2011   Diabetic foot ulcer (HCC) 09/05/2011   Obesity, Class III, BMI 40-49.9 (morbid obesity) (HCC) 08/15/2011   Diabetes mellitus type 2, insulin dependent (HCC) 06/12/2007   PCP:  [name], NP Pharmacy:   [id number] - [place] [place] - [address] AT [facility] [address] [place] [address] Phone: [phone] Fax: [phone]  [place] [facility] [address] [place] [address] Phone: [phone] Fax: [phone]     Social Determinants of Health (SDOH) Social History: SDOH Screenings   Food Insecurity: No Food Insecurity (02/09/2023)  Housing: Patient Declined (02/09/2023)  Transportation Needs: No Transportation Needs (02/09/2023)  Utilities: Not At Risk (02/09/2023)  Financial Resource Strain: High Risk (03/30/2021)   Received from [facility]  Physical Activity: Inactive (03/30/2021)   Received from [facility]  Social Connections: Unknown (11/24/2021)   Received from [facility]  Stress: Stress Concern Present (03/30/2021)   Received from [facility]  Tobacco Use: Low Risk  (02/09/2023)   SDOH Interventions:     Readmission Risk Interventions    02/13/2023   11:23 AM 01/26/2023    3:33 PM 01/16/2023   11:06 AM  Readmission Risk Prevention  Plan  Transportation Screening Complete Complete Complete  PCP or Specialist Appt within 5-7 Days   Complete  PCP or Specialist Appt within 3-5 Days Complete Complete   Home Care Screening   Complete  Medication Review (RN CM)   Complete  HRI or Home Care Consult Complete Complete   Social Work Consult for Recovery Care Planning/Counseling Complete Complete    Palliative Care Screening Complete Not Applicable   Medication Review Oceanographer) Complete Complete

## 2023-02-14 DIAGNOSIS — G8220 Paraplegia, unspecified: Secondary | ICD-10-CM | POA: Diagnosis not present

## 2023-02-14 LAB — BASIC METABOLIC PANEL
Anion gap: 9 (ref 5–15)
BUN: 28 mg/dL — ABNORMAL HIGH (ref 6–20)
CO2: 26 mmol/L (ref 22–32)
Calcium: 8.2 mg/dL — ABNORMAL LOW (ref 8.9–10.3)
Chloride: 100 mmol/L (ref 98–111)
Creatinine, Ser: 1.97 mg/dL — ABNORMAL HIGH (ref 0.44–1.00)
GFR, Estimated: 31 mL/min — ABNORMAL LOW (ref >60)
Glucose, Bld: 70 mg/dL (ref 70–99)
Potassium: 4.6 mmol/L (ref 3.5–5.1)
Sodium: 135 mmol/L (ref 135–145)

## 2023-02-14 LAB — CBC WITH DIFFERENTIAL/PLATELET
Abs Immature Granulocytes: 0.02 10*3/uL (ref 0.00–0.07)
Basophils Absolute: 0 10*3/uL (ref 0.0–0.1)
Basophils Relative: 1 %
Eosinophils Absolute: 0.2 10*3/uL (ref 0.0–0.5)
Eosinophils Relative: 3 %
HCT: 41.6 % (ref 36.0–46.0)
Hemoglobin: 12.4 g/dL (ref 12.0–15.0)
Immature Granulocytes: 0 %
Lymphocytes Relative: 30 %
Lymphs Abs: 1.8 10*3/uL (ref 0.7–4.0)
MCH: 28.2 pg (ref 26.0–34.0)
MCHC: 29.8 g/dL — ABNORMAL LOW (ref 30.0–36.0)
MCV: 94.8 fL (ref 80.0–100.0)
Monocytes Absolute: 0.5 10*3/uL (ref 0.1–1.0)
Monocytes Relative: 8 %
Neutro Abs: 3.5 10*3/uL (ref 1.7–7.7)
Neutrophils Relative %: 58 %
Platelets: 253 10*3/uL (ref 150–400)
RBC: 4.39 MIL/uL (ref 3.87–5.11)
RDW: 13 % (ref 11.5–15.5)
WBC: 6 10*3/uL (ref 4.0–10.5)
nRBC: 0 % (ref 0.0–0.2)

## 2023-02-14 LAB — GLUCOSE, CAPILLARY
Glucose-Capillary: 70 mg/dL (ref 70–99)
Glucose-Capillary: 70 mg/dL (ref 70–99)
Glucose-Capillary: 129 mg/dL — ABNORMAL HIGH (ref 70–99)
Glucose-Capillary: 126 mg/dL — ABNORMAL HIGH (ref 70–99)

## 2023-02-14 NOTE — Progress Notes (Signed)
Physical Therapy Treatment Patient Details Name: Karen Bennett MRN: [id number] DOB: [birth year] Today's Date: 02/14/2023   History of Present Illness Patient is a 47 year old female presenting to [facility] ED on 7/19 with complaints of RLE pain and weakness. CT thoracolumbar spine shows severe central canal stenosis on the right side at level of L2-L3 and moderate on the left side. Of note recently hospitalized for pneumonia. PMH: DM, Obesity, HTN, Rt BKA, CKD3, HLD, hypothyroidism, hx of PE, CHF, R-BKA.  Pt admitted with above diagnosis. Pt currently with functional limitations due to the deficits listed below (see PT Problem List).   PT Comments  A x 2 for safety and pt able to complete anterior scoot from bariatric recliner to bed with increased time, cues and min guard x 1 and min A x 1 for repositioning LLE. Pt able to tolerate OOB in recliner for 2 hrs and 40 mins with c/o B LE and back pain.  Pt left in bed, all needs in place and NT present. Pt will benefit from acute skilled PT to increase their independence and safety with mobility to allow discharge.      Assistance Recommended at Discharge Intermittent Supervision/Assistance  If plan is discharge home, recommend the following:  Can travel by private vehicle    Help with stairs or ramp for entrance;Assist for transportation;Two people to help with walking and/or transfers;Two people to help with bathing/dressing/bathroom      Equipment Recommendations  Other (comment) (Hoyer lift if going home and unable to perform transfer to power chair at mod I level)    Recommendations for Other Services       Precautions / Restrictions Precautions Precautions: Fall Precaution Comments: Hx of R-BKA, WC bound at baseline (power chair)` Restrictions Weight Bearing Restrictions: No     Mobility  Bed Mobility Overal bed mobility: Needs Assistance Bed Mobility: Sit to Supine, Rolling, Supine to Sit Rolling: Modified independent  (Device/Increase time)   Supine to sit: Modified independent (Device/Increase time) Sit to supine: Modified independent (Device/Increase time)   General bed mobility comments: pt is able to perform long sit in bed mod I with use of bed rails and use of momentum for rolling tasks    Transfers Overall transfer level: Needs assistance Equipment used: None Transfers: Bed to chair/wheelchair/BSC         Anterior-Posterior transfers: +2 safety/equipment, Min guard   General transfer comment: level surface AP scoot from bariatric recliner to bed with min A x 1 for repositioning LLE and  guard  x 1 to compelte scoot to recliner  and stabilize recliner providing cues for encouragement, pt required cues for pursed lip breathing and reported feeling anxious and asked nurse whom was assisting with transfer task for medicaiton    Ambulation/Gait               General Gait Details: Power chair at baseline   Stairs             Wheelchair Mobility     Tilt Bed    Modified Rankin (Stroke Patients Only)       Balance Overall balance assessment: Needs assistance Sitting-balance support:  (No UE support and LLE supported) Sitting balance-Leahy Scale: Good                                      Cognition Arousal/Alertness: Awake/alert Behavior During Therapy: [facility] for tasks assessed/performed  Overall Cognitive Status: Within Functional Limits for tasks assessed                                 General Comments: Pt remains tearful about difficulty with mobilization        Exercises      General Comments General comments (skin integrity, edema, etc.): pt indicated she was able to sit up and eat lunch today and the nausea was better later in the PM. pt able to tolerate sitting in recliner for 2 hrs and 40 mins limited due to reports of B LE and back pain      Pertinent Vitals/Pain Pain Assessment Pain Assessment: 0-10 Pain Score: 7  Pain  Location: bilateral legs, back Pain Descriptors / Indicators: Pressure, Tender, Discomfort, Constant Pain Intervention(s): Limited activity within patient's tolerance, Monitored during session, Repositioned    Home Living Family/patient expects to be discharged to:: Private residence Living Arrangements: Alone Available Help at Discharge: Personal care attendant Type of Home: Apartment Home Access: Level entry       Home Layout: One level Home Equipment: Grab bars - tub/shower;Grab bars - toilet;Tub bench;Adaptive equipment;Wheelchair - power;Hand held shower head Additional Comments: M-F aide 3-6hours depending, alternate weekends    Prior Function            PT Goals (current goals can now be found in the care plan section) Acute Rehab PT Goals Patient Stated Goal: To go home and be able to transfer PT Goal Formulation: With patient Time For Goal Achievement: 02/24/23 Potential to Achieve Goals: Fair    Frequency    Min 1X/week      PT Plan      Co-evaluation              AM-PAC PT "6 Clicks" Mobility   Outcome Measure  Help needed turning from your back to your side while in a flat bed without using bedrails?: None Help needed moving from lying on your back to sitting on the side of a flat bed without using bedrails?: None Help needed moving to and from a bed to a chair (including a wheelchair)?: A Lot Help needed standing up from a chair using your arms (e.g., wheelchair or bedside chair)?: Total Help needed to walk in hospital room?: Total Help needed climbing 3-5 steps with a railing? : Total 6 Click Score: 13    End of Session   Activity Tolerance: Patient limited by fatigue;Patient limited by pain Patient left: with call bell/phone within reach;in bed Nurse Communication: Mobility status;Need for lift equipment (Pt will require hoyer to bariatric recliner at this time if nursing staff is to conduct transfers) PT Visit Diagnosis: Pain;Muscle  weakness (generalized) (M62.81) Pain - Right/Left: Left (bilateral) Pain - part of body: Leg;Knee;Ankle and joints of foot     Time: 7829-5621 PT Time Calculation (min) (ACUTE ONLY): 24 min  Charges:    $Therapeutic Activity: 23-37 mins PT General Charges $$ ACUTE PT VISIT: 1 Visit                     Johnny Bridge, PT Acute Rehab    Jacqualyn Posey 02/14/2023, 6:21 PM

## 2023-02-14 NOTE — Progress Notes (Signed)
Physical Therapy Treatment Patient Details Name: Karen Bennett MRN: [id number] DOB: [birth year] Today's Date: 02/14/2023   History of Present Illness Patient is a 47 year old female presenting to [facility] ED on 7/19 with complaints of RLE pain and weakness. CT thoracolumbar spine shows severe central canal stenosis on the right side at level of L2-L3 and moderate on the left side. Of note recently hospitalized for pneumonia. PMH: DM, Obesity, HTN, Rt BKA, CKD3, HLD, hypothyroidism, hx of PE, CHF, R-BKA.    PT Comments   Pt admitted with above diagnosis.  Pt currently with functional limitations due to the deficits listed below (see PT Problem List). PT approached pt in am to provide a window for therapy intervention in pm. Pt agreeable. Pt continues to report nausea in am and pm. Pt required encouragement for participation. Pt is able to perform supine to long sit mod I in bed with use of bed rails. A x 2 for safety and pt able to complete posterior scoot to bariatric recliner with increased time, cues and min guard. Pt left seated in recliner, all needs in place and NT present. PT will return to assist pt back to bed, pt and NT are aware.  Pt will benefit from acute skilled PT to increase their independence and safety with mobility to allow discharge.       Assistance Recommended at Discharge Intermittent Supervision/Assistance  If plan is discharge home, recommend the following:  Can travel by private vehicle    Help with stairs or ramp for entrance;Assist for transportation;Two people to help with walking and/or transfers;Two people to help with bathing/dressing/bathroom      Equipment Recommendations  Other (comment) (Hoyer lift if going home and unable to perform transfer to power chair at mod I level)    Recommendations for Other Services       Precautions / Restrictions Precautions Precautions: Fall Precaution Comments: Hx of R-BKA, WC bound at baseline (power  chair)` Restrictions Weight Bearing Restrictions: No     Mobility  Bed Mobility Overal bed mobility: Needs Assistance Bed Mobility: Supine to Sit, Sit to Supine     Supine to sit: Modified independent (Device/Increase time) Sit to supine: Modified independent (Device/Increase time)   General bed mobility comments: pt is able to perform long sit in bed mod I with use of bed rails    Transfers Overall transfer level: Needs assistance Equipment used: None Transfers: Bed to chair/wheelchair/BSC         Anterior-Posterior transfers: +2 safety/equipment, Min guard   General transfer comment: level surface AP scoot from bed to bariatric recliner with min guard  x 2 to compelte scoot to recliner  and stabilize recliner providing cues for encouragement    Ambulation/Gait               General Gait Details: Power chair at baseline   Stairs             Wheelchair Mobility     Tilt Bed    Modified Rankin (Stroke Patients Only)       Balance Overall balance assessment: Needs assistance Sitting-balance support:  (No UE support and LLE supported) Sitting balance-Leahy Scale: Good                                      Cognition Arousal/Alertness: Awake/alert Behavior During Therapy: WFL for tasks assessed/performed Overall Cognitive Status: Within Functional Limits for  tasks assessed                                 General Comments: Pt remains tearful about difficulty with mobilization        Exercises      General Comments General comments (skin integrity, edema, etc.): pt states she continues to feel nauseated and fatigued      Pertinent Vitals/Pain Pain Assessment Pain Assessment: 0-10 Pain Score: 3  Pain Location: bilateral legs, back Pain Descriptors / Indicators: Pressure, Tender Pain Intervention(s): Limited activity within patient's tolerance, Monitored during session    Home Living Family/patient expects  to be discharged to:: Private residence Living Arrangements: Alone Available Help at Discharge: Personal care attendant Type of Home: Apartment Home Access: Level entry       Home Layout: One level Home Equipment: Grab bars - tub/shower;Grab bars - toilet;Tub bench;Adaptive equipment;Wheelchair - power;Hand held shower head Additional Comments: M-F aide 3-6hours depending, alternate weekends    Prior Function            PT Goals (current goals can now be found in the care plan section) Acute Rehab PT Goals Patient Stated Goal: To go home and be able to transfer PT Goal Formulation: With patient Time For Goal Achievement: 02/24/23 Potential to Achieve Goals: Fair    Frequency    Min 1X/week      PT Plan      Co-evaluation              AM-PAC PT "6 Clicks" Mobility   Outcome Measure  Help needed turning from your back to your side while in a flat bed without using bedrails?: None Help needed moving from lying on your back to sitting on the side of a flat bed without using bedrails?: None Help needed moving to and from a bed to a chair (including a wheelchair)?: A Lot Help needed standing up from a chair using your arms (e.g., wheelchair or bedside chair)?: Total Help needed to walk in hospital room?: Total Help needed climbing 3-5 steps with a railing? : Total 6 Click Score: 13    End of Session   Activity Tolerance: Patient limited by fatigue Patient left: with nursing/sitter in room;in chair;with call bell/phone within reach Nurse Communication: Mobility status;Need for lift equipment (Pt will require hoyer to bariatric recliner at this time) PT Visit Diagnosis: Pain;Muscle weakness (generalized) (M62.81) Pain - Right/Left: Left (bilateral) Pain - part of body: Leg;Knee;Ankle and joints of foot     Time: 1333-1405 PT Time Calculation (min) (ACUTE ONLY): 32 min  Charges:    $Therapeutic Activity: 23-37 mins PT General Charges $$ ACUTE PT VISIT: 1  Visit                     Johnny Bridge, PT Acute Rehab    Jacqualyn Posey 02/14/2023, 2:27 PM

## 2023-02-14 NOTE — Progress Notes (Addendum)
PROGRESS NOTE    Karen SALLIE  Bennett:[id number] DOB: [birth year] DOA: 02/09/2023 PCP: [name], NP   Brief Narrative:  HPI: Karen Bennett is a 47 y.o. female with medical history significant of morbid obesiety. Right BKA. Patient uses wheel chair to be mobile.  Patient reports for the last couple of weeks she is finding it increasingly difficult to lift her legs and get in and out of her wheelchair.  To an extent today that she could not do it at all and missed her appointment with the primary care provider.  Patient ascribes this to "swelling "of the legs and increased weight.  She denies any chest pain or shortness of breath that is new.  She denies any vomiting or diarrhea.  Patient has had upper mid back pain for the last 1 week that is not precipitated by trauma no aggravating or relieving factors identified.   Patient finally came to the ER today due to inability to function at home with her impairment.   ER course is notable for patient receiving Lasix, patient is on room air, patient did not report any shortness of breath to me.  Medical evaluation is sought  Assessment & Plan:   Principal Problem:   Paraplegia (HCC) Active Problems:   Normocytic anemia   History of pulmonary embolism   CKD (chronic kidney disease)  Right leg pain/lumbar neuropathy: Patient has been complaining of right leg pain which started recently.  Unable to lift as much as she used to in the past.  CT thoracolumbar spine shows severe central canal stenosis on the right side at level of L2-L3 and moderate on the left side.  Discussed with the patient that she may benefit from neurosurgery visit as outpatient for possible steroid injections or any other intervention and recommend follow-up with neurosurgery as outpatient.  She verbalizes understanding.  Seen by PT OT, they recommend home health PT.  Type 2 diabetes mellitus: Recent hemoglobin A1c 01/12/2023 was 10.7.  She takes 50 units of long-acting  insulin at home which we have resumed along with SSI.  Blood sugar controlled.  Hyperlipidemia: Continue atorvastatin.  Fluid overload: Edema improving.  Fluid overload improving.  She had good diuresis over the last 24 hours apart 3 L.  She is very happy about that.  We will continue Lasix infusion for another day.  History of PE: Stable.  Continue Xarelto.  Normocytic anemia: Stable.  Acquired hypothyroidism: Continue Synthroid.  Morbid obesity: Weight loss and dietary modification counseled.  May benefit from following up with the obesity clinic as outpatient.  DVT prophylaxis: SCDs Start: 02/09/23 2202 Xarelto   Code Status: Full Code  Family Communication:  None present at bedside.  Plan of care discussed with patient in length and he/she verbalized understanding and agreed with it.  Status is: Inpatient Remains inpatient appropriate because: Still fluid overloaded, needs IV diuresis.     Estimated body mass index is 69.85 kg/m as calculated from the following:   Height as of this encounter: 5\' 6"  (1.676 m).   Weight as of this encounter: 196.3 kg.    Nutritional Assessment: Body mass index is 69.85 kg/m.Marland Kitchen Seen by dietician.  I agree with the assessment and plan as outlined below: Nutrition Status:        . Skin Assessment: I have examined the patient's skin and I agree with the wound assessment as performed by the wound care RN as outlined below:    Consultants:  None  Procedures:  None  Antimicrobials:  Anti-infectives (From admission, onward)    None         Subjective: Patient seen and examined.  No complaints.  Objective: Vitals:   02/13/23 1334 02/13/23 2116 02/14/23 0703 02/14/23 0706  BP: 119/62 (!) 141/65 (!) 136/51   Pulse: (!) 59 60 (!) 59   Resp: 16 18 18    Temp: 97.8 F (36.6 C) 98.2 F (36.8 C) 98.4 F (36.9 C)   TempSrc: Oral Oral Oral   SpO2: 92% 91% 93%   Weight:    (!) 196.3 kg  Height:        Intake/Output Summary  (Last 24 hours) at 02/14/2023 1049 Last data filed at 02/14/2023 1000 Gross per 24 hour  Intake 360 ml  Output 2600 ml  Net -2240 ml   Filed Weights   02/12/23 0500 02/13/23 0556 02/14/23 0706  Weight: (!) 195.1 kg (!) 199.5 kg (!) 196.3 kg    Examination:  General exam: Appears calm and comfortable  Respiratory system: Clear to auscultation. Respiratory effort normal. Cardiovascular system: S1 & S2 heard, RRR. No JVD, murmurs, rubs, gallops or clicks.  +2 pitting edema bilateral lower extremity. Gastrointestinal system: Abdomen is nondistended, soft and nontender. No organomegaly or masses felt. Normal bowel sounds heard. Central nervous system: Alert and oriented. No focal neurological deficits. Extremities: Right BKA Psychiatry: Judgement and insight appear normal. Mood & affect appropriate.   Data Reviewed: I have personally reviewed following labs and imaging studies  CBC: Recent Labs  Lab 02/09/23 1735 02/10/23 0605  WBC 6.4 6.3  NEUTROABS 4.4  --   HGB 11.8* 11.1*  HCT 37.0 35.6*  MCV 88.5 91.5  PLT 298 271   Basic Metabolic Panel: Recent Labs  Lab 02/10/23 0605 02/11/23 0616 02/12/23 0509 02/13/23 0738 02/14/23 0611  NA 133* 136 133* 134* 135  K 4.1 4.1 4.2 5.0 4.6  CL 105 104 103 103 100  CO2 24 27 23 23 26   GLUCOSE 173* 114* 90 74 70  BUN 19 20 22* 25* 28*  CREATININE 1.28* 1.46* 1.63* 1.95* 1.97*  CALCIUM 8.1* 8.2* 7.9* 8.0* 8.2*   GFR: Estimated Creatinine Clearance: 65 mL/min (A) (by C-G formula based on SCr of 1.97 mg/dL (H)). Liver Function Tests: Recent Labs  Lab 02/09/23 1735  AST 14*  ALT 8  ALKPHOS 57  BILITOT 1.0  PROT 6.5  ALBUMIN 2.1*   No results for input(s): "LIPASE", "AMYLASE" in the last 168 hours. No results for input(s): "AMMONIA" in the last 168 hours. Coagulation Profile: Recent Labs  Lab 02/10/23 0605  INR 1.6*   Cardiac Enzymes: No results for input(s): "CKTOTAL", "CKMB", "CKMBINDEX", "TROPONINI" in the last 168  hours. BNP (last 3 results) No results for input(s): "PROBNP" in the last 8760 hours. HbA1C: No results for input(s): "HGBA1C" in the last 72 hours. CBG: Recent Labs  Lab 02/13/23 1020 02/13/23 1213 02/13/23 1646 02/13/23 2116 02/14/23 0718  GLUCAP 91 85 82 113* 70   Lipid Profile: No results for input(s): "CHOL", "HDL", "LDLCALC", "TRIG", "CHOLHDL", "LDLDIRECT" in the last 72 hours.  Thyroid Function Tests: No results for input(s): "TSH", "T4TOTAL", "FREET4", "T3FREE", "THYROIDAB" in the last 72 hours. Anemia Panel: No results for input(s): "VITAMINB12", "FOLATE", "FERRITIN", "TIBC", "IRON", "RETICCTPCT" in the last 72 hours.  Sepsis Labs: No results for input(s): "PROCALCITON", "LATICACIDVEN" in the last 168 hours.  No results found for this or any previous visit (from the past 240 hour(s)).   Radiology Studies: No results found.  Scheduled Meds:  atorvastatin  40 mg Oral Daily   buPROPion ER  100 mg Oral Daily   insulin aspart  0-15 Units Subcutaneous TID WC   insulin aspart  0-5 Units Subcutaneous QHS   insulin glargine-yfgn  50 Units Subcutaneous QHS   levothyroxine  50 mcg Oral Q0600   Rivaroxaban  15 mg Oral BID WC   Followed by   Melene Muller ON 02/16/2023] rivaroxaban  20 mg Oral Q supper   sodium chloride flush  3 mL Intravenous Q12H   traZODone  150 mg Oral QHS   Continuous Infusions:  furosemide (LASIX) 200 mg in dextrose 5 % 100 mL (2 mg/mL) infusion 8 mg/hr (02/13/23 1228)     LOS: 3 days   Hughie Closs, MD [facility]  02/14/2023, 10:49 AM   *Please note that this is a verbal dictation therefore any spelling or grammatical errors are due to the "Dragon [employer] One" system interpretation.  Please page via Amion and do not message via secure chat for urgent patient care matters. Secure chat can be used for non urgent patient care matters.  How to contact the [facility] Attending or Consulting provider 7A - 7P or covering provider during after hours 7P  -7A, for this patient?  Check the care team in [facility] and look for a) attending/consulting [facility] provider listed and b) the [facility] team listed. Page or secure chat 7A-7P. Log into [email or web address] and use [place]'s universal password to access. If you do not have the password, please contact the hospital operator. Locate the [facility] provider you are looking for under [facility] and page to a number that you can be directly reached. If you still have difficulty reaching the provider, please page the [facility] (Director on Call) for the Hospitalists listed on amion for assistance.

## 2023-02-15 DIAGNOSIS — R600 Localized edema: Secondary | ICD-10-CM | POA: Diagnosis not present

## 2023-02-15 DIAGNOSIS — I2699 Other pulmonary embolism without acute cor pulmonale: Secondary | ICD-10-CM

## 2023-02-15 DIAGNOSIS — G8220 Paraplegia, unspecified: Secondary | ICD-10-CM | POA: Diagnosis not present

## 2023-02-15 LAB — COMPREHENSIVE METABOLIC PANEL
ALT: 8 U/L (ref 0–44)
AST: 10 U/L — ABNORMAL LOW (ref 15–41)
Albumin: 2.4 g/dL — ABNORMAL LOW (ref 3.5–5.0)
Alkaline Phosphatase: 52 U/L (ref 38–126)
Anion gap: 9 (ref 5–15)
BUN: 33 mg/dL — ABNORMAL HIGH (ref 6–20)
CO2: 27 mmol/L (ref 22–32)
Calcium: 8.4 mg/dL — ABNORMAL LOW (ref 8.9–10.3)
Chloride: 100 mmol/L (ref 98–111)
Creatinine, Ser: 1.85 mg/dL — ABNORMAL HIGH (ref 0.44–1.00)
GFR, Estimated: 34 mL/min — ABNORMAL LOW (ref >60)
Glucose, Bld: 113 mg/dL — ABNORMAL HIGH (ref 70–99)
Potassium: 4 mmol/L (ref 3.5–5.1)
Sodium: 136 mmol/L (ref 135–145)
Total Bilirubin: 0.7 mg/dL (ref 0.3–1.2)
Total Protein: 6.7 g/dL (ref 6.5–8.1)

## 2023-02-15 LAB — GLUCOSE, CAPILLARY
Glucose-Capillary: 135 mg/dL — ABNORMAL HIGH (ref 70–99)
Glucose-Capillary: 88 mg/dL (ref 70–99)
Glucose-Capillary: 121 mg/dL — ABNORMAL HIGH (ref 70–99)
Glucose-Capillary: 96 mg/dL (ref 70–99)

## 2023-02-15 NOTE — Progress Notes (Signed)
  Progress Note   Patient: Karen Bennett ZOX:[id number] DOB: 06-23-1976 DOA: 02/09/2023     4 DOS: the patient was seen and examined on 02/15/2023   Brief hospital course: 47 y.o. female with medical history significant of morbid obesiety. Right BKA. Patient uses wheel chair to be mobile.  Patient reports for the last couple of weeks she is finding it increasingly difficult to lift her legs and get in and out of her wheelchair.  To an extent today that she could not do it at all and missed her appointment with the primary care provider.  Patient ascribes this to "swelling "of the legs and increased weight.  She denies any chest pain or shortness of breath that is new.  She denies any vomiting or diarrhea.  Patient has had upper mid back pain for the last 1 week that is not precipitated by trauma no aggravating or relieving factors identified.   Patient finally came to the ER today due to inability to function at home with her impairment  Assessment and Plan: Right leg pain/lumbar neuropathy: Patient has been complaining of right leg pain which started recently.  Unable to lift as much as she used to in the past.  CT thoracolumbar spine shows severe central canal stenosis on the right side at level of L2-L3 and moderate on the left side.   -Dr. Jacqulyn Bath discussed with the patient that she may benefit from neurosurgery visit as outpatient for possible steroid injections or any other intervention and recommend follow-up with neurosurgery as outpatient.   -PT/OT recs for home health PT.   Type 2 diabetes mellitus: Recent hemoglobin A1c 01/12/2023 was 10.7.   -continue 50 units of long-acting insulin  -cont SSI.  Blood sugar controlled.   Hyperlipidemia: Continue atorvastatin.   Fluid overload: Edema improving.  Fluid overload improving.   -cont diuresis as tolerated -Of note, pt has a 3L jug of water that she reports drinking daily. Will implement fluid restriction.   History of PE: Stable.   Continue Xarelto.   Normocytic anemia: Stable.   Acquired hypothyroidism: Continue Synthroid.   Morbid obesity: Weight loss and dietary modification counseled.  May benefit from following up with the obesity clinic as outpatient.      Subjective: Reports feeling better today  Physical Exam: Vitals:   02/14/23 1507 02/14/23 2124 02/15/23 0557 02/15/23 1357  BP: (!) 149/72 129/62 (!) 153/71 (!) 160/70  Pulse: (!) 58 (!) 58 71 72  Resp: 18 18 18 14   Temp: 97.9 F (36.6 C) 98.2 F (36.8 C) 98.5 F (36.9 C) 98.9 F (37.2 C)  TempSrc: Oral Oral Oral Oral  SpO2: 93% 92% 91% 90%  Weight:      Height:       General exam: Awake, laying in bed, in nad Respiratory system: Normal respiratory effort, no wheezing Cardiovascular system: regular rate, s1, s2 Gastrointestinal system: Soft, nondistended, positive BS Central nervous system: CN2-12 grossly intact, strength intact Extremities: Perfused, no clubbing, BLE edema Skin: Normal skin turgor, no notable skin lesions seen Psychiatry: Mood normal // no visual hallucinations   Data Reviewed:  Labs reviewed: Na 136, K 4.0, Cr 1.85  Family Communication: Pt in room, family not at bedside  Disposition: Status is: Inpatient Remains inpatient appropriate because: severity of illness  Planned Discharge Destination: Home with Home Health    Author: Rickey Barbara, MD 02/15/2023 7:41 PM  For on call review [email or web address].

## 2023-02-15 NOTE — Care Management Important Message (Signed)
Important Message  Patient Details IM Letter given. Name: SHAMYIA GRANDPRE MRN: [id number] Date of Birth: February 09, 1976   Medicare Important Message Given:  Yes     [name] 02/15/2023, 10:46 AM

## 2023-02-15 NOTE — Hospital Course (Addendum)
Karen Bennett is a 47 y.o. female with a history of morbid obesity, right BKA, diabetes mellitus, CKD stage IIIa, pulmonary embolism, anemia, hypothyroidism.  Patient presented secondary to inability to function secondary to worsening low back pain and decreased mobility.  During this admission she was found to have evidence of fluid overload and was started on IV Lasix for treatment.  CT imaging significant for evidence of canal stenosis without compression.

## 2023-02-16 DIAGNOSIS — G8220 Paraplegia, unspecified: Secondary | ICD-10-CM | POA: Diagnosis not present

## 2023-02-16 DIAGNOSIS — R600 Localized edema: Secondary | ICD-10-CM | POA: Diagnosis not present

## 2023-02-16 DIAGNOSIS — I2699 Other pulmonary embolism without acute cor pulmonale: Secondary | ICD-10-CM | POA: Diagnosis not present

## 2023-02-16 LAB — GLUCOSE, CAPILLARY
Glucose-Capillary: 95 mg/dL (ref 70–99)
Glucose-Capillary: 89 mg/dL (ref 70–99)
Glucose-Capillary: 71 mg/dL (ref 70–99)
Glucose-Capillary: 95 mg/dL (ref 70–99)

## 2023-02-16 LAB — COMPREHENSIVE METABOLIC PANEL: CO2: 30 mmol/L (ref 22–32)

## 2023-02-16 MED ORDER — ALBUMIN HUMAN 25 % IV SOLN
25.0000 g | Freq: Once | INTRAVENOUS | Status: AC
  Administered 2023-02-16: 25 g via INTRAVENOUS
  Filled 2023-02-16: qty 100

## 2023-02-16 MED ORDER — FUROSEMIDE 10 MG/ML IJ SOLN
40.0000 mg | Freq: Two times a day (BID) | INTRAMUSCULAR | Status: DC
  Administered 2023-02-16 – 2023-02-18 (×6): 40 mg via INTRAVENOUS
  Filled 2023-02-16 (×6): qty 4

## 2023-02-16 NOTE — Progress Notes (Signed)
PT Note  Patient Details Name: CLAYTON SCHIFFMAN MRN: [id number] DOB: [birth year]   Cancelled Treatment:    Reason Eval/Treat Not Completed: Patient declined, no reason specified. PT to return later in PM for intervention if time allows.   Johnny Bridge, PT Acute Rehab   Jacqualyn Posey 02/16/2023, 1:32 PM

## 2023-02-16 NOTE — Progress Notes (Signed)
  Progress Note   Patient: Karen Bennett [id number]:[id number] DOB: 1976/07/23 DOA: 02/09/2023     5 DOS: the patient was seen and examined on 02/16/2023   Brief hospital course: 47 y.o. female with medical history significant of morbid obesiety. Right BKA. Patient uses wheel chair to be mobile.  Patient reports for the last couple of weeks she is finding it increasingly difficult to lift her legs and get in and out of her wheelchair.  To an extent today that she could not do it at all and missed her appointment with the primary care provider.  Patient ascribes this to "swelling "of the legs and increased weight.  She denies any chest pain or shortness of breath that is new.  She denies any vomiting or diarrhea.  Patient has had upper mid back pain for the last 1 week that is not precipitated by trauma no aggravating or relieving factors identified.   Patient finally came to the ER today due to inability to function at home with her impairment  Assessment and Plan: Right leg pain/lumbar neuropathy: Patient has been complaining of right leg pain which started recently.  Unable to lift as much as she used to in the past.  CT thoracolumbar spine shows severe central canal stenosis on the right side at level of L2-L3 and moderate on the left side.   -Dr. Jacqulyn Bath discussed with the patient that she may benefit from neurosurgery visit as outpatient for possible steroid injections or any other intervention and recommend follow-up with neurosurgery as outpatient.   -PT/OT recs for home health PT.   Type 2 diabetes mellitus: Recent hemoglobin A1c 01/12/2023 was 10.7.   -continue 50 units of long-acting insulin  -cont SSI.  Blood sugar controlled.   Hyperlipidemia: Continue atorvastatin.   Fluid overload: Edema improving.  Fluid overload improving.   -cont diuresis as tolerated, on 40mg  IV lasix bid -continue 1500cc fluid restriction -diuresing well thus far   History of PE: Stable.  Continue Xarelto.    Normocytic anemia: Stable.   Acquired hypothyroidism: Continue Synthroid.   Morbid obesity: Weight loss and dietary modification counseled.  May benefit from following up with the obesity clinic as outpatient.      Subjective: States voiding well. Feeling less swollen  Physical Exam: Vitals:   02/15/23 1357 02/15/23 2142 02/16/23 0529 02/16/23 1329  BP: (!) 160/70 135/62 135/60 (!) 158/67  Pulse: 72 (!) 56 67 (!) 57  Resp: 14 18 18 18   Temp: 98.9 F (37.2 C) 98.1 F (36.7 C) 98.5 F (36.9 C) 97.9 F (36.6 C)  TempSrc: Oral Oral Oral Oral  SpO2: 90% 91% 92% 98%  Weight:      Height:       General exam: Conversant, in no acute distress Respiratory system: normal chest rise, clear, no audible wheezing Cardiovascular system: regular rhythm, s1-s2 Gastrointestinal system: Nondistended, nontender, pos BS Central nervous system: No seizures, no tremors Extremities: No cyanosis, no joint deformities, BLE edema Skin: No rashes, no pallor Psychiatry: Affect normal // no auditory hallucinations   Data Reviewed:  Labs reviewed: Na 135, K 4.4, Cr 1.91, WBC 5.5, Hgb 10.7  Family Communication: Pt in room, family not at bedside  Disposition: Status is: Inpatient Remains inpatient appropriate because: severity of illness  Planned Discharge Destination: Home with Home Health    Author: Rickey Barbara, MD 02/16/2023 4:17 PM  For on call review [email or web address].

## 2023-02-16 NOTE — Progress Notes (Signed)
Initial Nutrition Assessment  DOCUMENTATION CODES:   Morbid obesity  INTERVENTION:  - Carb Modified diet. fluid restriction per MD. Diet education provided on fluid restriction.  - Monitor weight trends.  Nutrition status stable, will sign off at this time. Please re-consult as needed.   NUTRITION DIAGNOSIS:   Food and nutrition related knowledge deficit related to limited prior education as evidenced by per patient/family report.  GOAL:   Patient will meet greater than or equal to 90% of their needs  MONITOR:   Labs, Weight trends  REASON FOR ASSESSMENT:   Consult Assessment of nutrition requirement/status  ASSESSMENT:   47 y.o. female with PMH significant of morbid obesiety, Right BKA, uses wheel chair to be mobile who presented after finding it increasingly difficult to lift her lefts and get in/out of her wheelchair over the past few weeks as well as swelling in her legs. Admitted for lumbar neuropathy and fluid overload.   Patient reports a UBW of 406-412# and weight gain over the past 1 month due to fluid.  She reports not eating very much at home. Usually only has 1, maybe 2 meals a day. Shares her appetite is poor at baseline and has never really been good.  Current appetite remains the same but she reports eating fairly well. Documented to be consuming an average of 87% of meals over the past week. It is noted in MD notes that patient typically drinks 3L of water a day. She is now on a fluid restriction and lasix to decrease fluid overload.    Medications reviewed and include: Lasix, Insulin  Labs reviewed:  Creatinine 1.91 HA1C 10.7 Blood Glucose 88-135 x24 hours   NUTRITION - FOCUSED PHYSICAL EXAM:  No depletions  Diet Order:   Diet Order             Diet Carb Modified Fluid consistency: Thin; Room service appropriate? Yes; Fluid restriction: 1500 mL Fluid  Diet effective now                   EDUCATION NEEDS:  Education needs have  been addressed  Skin:  Skin Assessment: Reviewed RN Assessment  Last BM:  7/22  Height:  Ht Readings from Last 1 Encounters:  02/09/23 5\' 6"  (1.676 m)   Weight:  Wt Readings from Last 1 Encounters:  02/14/23 (!) 196.3 kg   Ideal Body Weight:  59.09 kg  BMI:  Body mass index is 69.85 kg/m.  Estimated Nutritional Needs:  Kcal:  1900-2100 kcals Protein:  75-90 grams Fluid:  >/= 1.9L    Shelle Iron RD, LDN For contact information, refer to [facility].

## 2023-02-16 NOTE — Progress Notes (Signed)
PT Note  Patient Details Name: Karen Bennett MRN: [id number] DOB: [birth year]   Cancelled Treatment:    Reason Eval/Treat Not Completed: Patient declined, no reason specified. PT arrived later in the afternoon and pt declined again. Pt reported she was not feeling well and very sleepy. PT to continue to follow acutely.   Johnny Bridge, PT Acute Rehab    Jacqualyn Posey 02/16/2023, 4:05 PM

## 2023-02-17 DIAGNOSIS — G8220 Paraplegia, unspecified: Secondary | ICD-10-CM | POA: Diagnosis not present

## 2023-02-17 DIAGNOSIS — I2699 Other pulmonary embolism without acute cor pulmonale: Secondary | ICD-10-CM | POA: Diagnosis not present

## 2023-02-17 DIAGNOSIS — R600 Localized edema: Secondary | ICD-10-CM | POA: Diagnosis not present

## 2023-02-17 LAB — GLUCOSE, CAPILLARY
Glucose-Capillary: 71 mg/dL (ref 70–99)
Glucose-Capillary: 86 mg/dL (ref 70–99)
Glucose-Capillary: 88 mg/dL (ref 70–99)
Glucose-Capillary: 79 mg/dL (ref 70–99)

## 2023-02-17 NOTE — Plan of Care (Addendum)
Progression with notable decrease in weight. Positive response to IV lasix.

## 2023-02-17 NOTE — Plan of Care (Signed)

## 2023-02-17 NOTE — Progress Notes (Signed)
Progress Note   Patient: Karen Bennett ION:[id number] DOB: [birth year] DOA: 02/09/2023     6 DOS: the patient was seen and examined on 02/17/2023   Brief hospital course: 47 y.o. female with medical history significant of morbid obesiety. Right BKA. Patient uses wheel chair to be mobile.  Patient reports for the last couple of weeks she is finding it increasingly difficult to lift her legs and get in and out of her wheelchair.  To an extent today that she could not do it at all and missed her appointment with the primary care provider.  Patient ascribes this to "swelling "of the legs and increased weight.  She denies any chest pain or shortness of breath that is new.  She denies any vomiting or diarrhea.  Patient has had upper mid back pain for the last 1 week that is not precipitated by trauma no aggravating or relieving factors identified.   Patient finally came to the ER today due to inability to function at home with her impairment  Assessment and Plan: Right leg pain/lumbar neuropathy: Patient has been complaining of right leg pain which started recently.  Unable to lift as much as she used to in the past.  CT thoracolumbar spine shows severe central canal stenosis on the right side at level of L2-L3 and moderate on the left side.   -Dr. Jacqulyn Bath discussed with the patient that she may benefit from neurosurgery visit as outpatient for possible steroid injections or any other intervention and recommend follow-up with neurosurgery as outpatient.   -PT/OT recs for home health PT.   Type 2 diabetes mellitus: Recent hemoglobin A1c 01/12/2023 was 10.7.   -continue 50 units of long-acting insulin  -cont SSI.  Blood sugar controlled.   Hyperlipidemia: Continue atorvastatin.   Fluid overload:  -Recently observed to have gallon jug of water at bedside with pt admitting to drink the whole gallon daily. Pt was later placed on lasix gtt -Edema now improving.  Fluid overload improving.   -cont diuresis  as tolerated, on 40mg  IV lasix bid -continue 1500cc fluid restriction -diuresing well thus far -Peak wt 199.5kg -Wt today 183.3kg   History of PE: Stable.  Continue Xarelto.   Normocytic anemia: Stable.   Acquired hypothyroidism: Continue Synthroid.   Morbid obesity: Weight loss and dietary modification counseled.  May benefit from following up with the obesity clinic as outpatient.      Subjective: Voiding very well. States swelling is improving  Physical Exam: Vitals:   02/17/23 0433 02/17/23 0500 02/17/23 1029 02/17/23 1341  BP: 129/62   (!) 126/56  Pulse: (!) 55   (!) 57  Resp: 16   18  Temp: 99.2 F (37.3 C)   98.8 F (37.1 C)  TempSrc: Oral   Oral  SpO2: 98%   94%  Weight:  (!) 188.4 kg (!) 183.3 kg   Height:       General exam: Awake, laying in bed, in nad Respiratory system: Normal respiratory effort, no wheezing Cardiovascular system: regular rate, s1, s2 Gastrointestinal system: Soft, nondistended, positive BS Central nervous system: CN2-12 grossly intact, strength intact Extremities: Perfused, no clubbing, BLE edema, improving Skin: Normal skin turgor, no notable skin lesions seen Psychiatry: Mood normal // no visual hallucinations   Data Reviewed:  Labs reviewed: Na 136, K 4.1, Cr 1.77, WBC 5.3, hgb 10.3  Family Communication: Pt in room, family not at bedside  Disposition: Status is: Inpatient Remains inpatient appropriate because: severity of illness  Planned Discharge Destination:  Home with Home Health    Author: Rickey Barbara, MD 02/17/2023 5:06 PM  For on call review [email or web address].

## 2023-02-18 DIAGNOSIS — R600 Localized edema: Secondary | ICD-10-CM | POA: Diagnosis not present

## 2023-02-18 DIAGNOSIS — G8220 Paraplegia, unspecified: Secondary | ICD-10-CM | POA: Diagnosis not present

## 2023-02-18 DIAGNOSIS — I2699 Other pulmonary embolism without acute cor pulmonale: Secondary | ICD-10-CM | POA: Diagnosis not present

## 2023-02-18 LAB — CBC
HCT: 32.3 % — ABNORMAL LOW (ref 36.0–46.0)
Hemoglobin: 9.9 g/dL — ABNORMAL LOW (ref 12.0–15.0)
MCH: 28.2 pg (ref 26.0–34.0)
MCHC: 30.7 g/dL (ref 30.0–36.0)
MCV: 92 fL (ref 80.0–100.0)
Platelets: 219 10*3/uL (ref 150–400)
RBC: 3.51 MIL/uL — ABNORMAL LOW (ref 3.87–5.11)
RDW: 12.7 % (ref 11.5–15.5)
WBC: 5.5 10*3/uL (ref 4.0–10.5)
nRBC: 0 % (ref 0.0–0.2)

## 2023-02-18 LAB — COMPREHENSIVE METABOLIC PANEL WITH GFR
ALT: 8 U/L (ref 0–44)
AST: 10 U/L — ABNORMAL LOW (ref 15–41)
Albumin: 2.2 g/dL — ABNORMAL LOW (ref 3.5–5.0)
Alkaline Phosphatase: 47 U/L (ref 38–126)
Anion gap: 7 (ref 5–15)
BUN: 36 mg/dL — ABNORMAL HIGH (ref 6–20)
CO2: 31 mmol/L (ref 22–32)
Calcium: 8.2 mg/dL — ABNORMAL LOW (ref 8.9–10.3)
Chloride: 96 mmol/L — ABNORMAL LOW (ref 98–111)
Creatinine, Ser: 1.78 mg/dL — ABNORMAL HIGH (ref 0.44–1.00)
GFR, Estimated: 35 mL/min — ABNORMAL LOW (ref >60)
Glucose, Bld: 80 mg/dL (ref 70–99)
Potassium: 3.9 mmol/L (ref 3.5–5.1)
Sodium: 134 mmol/L — ABNORMAL LOW (ref 135–145)
Total Bilirubin: 0.7 mg/dL (ref 0.3–1.2)
Total Protein: 6.3 g/dL — ABNORMAL LOW (ref 6.5–8.1)

## 2023-02-18 LAB — GLUCOSE, CAPILLARY
Glucose-Capillary: 77 mg/dL (ref 70–99)
Glucose-Capillary: 59 mg/dL — ABNORMAL LOW (ref 70–99)
Glucose-Capillary: 58 mg/dL — ABNORMAL LOW (ref 70–99)
Glucose-Capillary: 58 mg/dL — ABNORMAL LOW (ref 70–99)
Glucose-Capillary: 85 mg/dL (ref 70–99)
Glucose-Capillary: 74 mg/dL (ref 70–99)
Glucose-Capillary: 75 mg/dL (ref 70–99)

## 2023-02-18 MED ORDER — FUROSEMIDE 10 MG/ML IJ SOLN
60.0000 mg | Freq: Two times a day (BID) | INTRAMUSCULAR | Status: DC
  Administered 2023-02-19 (×2): 60 mg via INTRAVENOUS
  Filled 2023-02-18 (×2): qty 6

## 2023-02-18 MED ORDER — INSULIN GLARGINE-YFGN 100 UNIT/ML ~~LOC~~ SOLN
40.0000 [IU] | Freq: Every day | SUBCUTANEOUS | Status: DC
  Administered 2023-02-18 – 2023-02-19 (×2): 40 [IU] via SUBCUTANEOUS
  Filled 2023-02-18 (×2): qty 0.4

## 2023-02-18 MED ORDER — ALBUMIN HUMAN 25 % IV SOLN
25.0000 g | Freq: Once | INTRAVENOUS | Status: AC
  Administered 2023-02-18: 25 g via INTRAVENOUS
  Filled 2023-02-18: qty 100

## 2023-02-18 NOTE — Care Plan (Signed)
Pt ate lunch. Glucose now 85.

## 2023-02-18 NOTE — Care Plan (Addendum)
FSBS 59. Pt asymptomatic. Apple juice given. Recheck was 58. Two apple juices with sugar, graham crackers, and peanut butter given. Pt rechecked again, glucose still 58. Dr. Rhona Leavens made aware. Pt ordering lunch.

## 2023-02-18 NOTE — Progress Notes (Signed)
Progress Note   Patient: Karen Bennett [id number]:[id number] DOB: 1975/10/04 DOA: 02/09/2023     7 DOS: the patient was seen and examined on 02/18/2023   Brief hospital course: 47 y.o. female with medical history significant of morbid obesiety. Right BKA. Patient uses wheel chair to be mobile.  Patient reports for the last couple of weeks she is finding it increasingly difficult to lift her legs and get in and out of her wheelchair.  To an extent today that she could not do it at all and missed her appointment with the primary care provider.  Patient ascribes this to "swelling "of the legs and increased weight.  She denies any chest pain or shortness of breath that is new.  She denies any vomiting or diarrhea.  Patient has had upper mid back pain for the last 1 week that is not precipitated by trauma no aggravating or relieving factors identified.   Patient finally came to the ER today due to inability to function at home with her impairment  Assessment and Plan: Right leg pain/lumbar neuropathy: Patient has been complaining of right leg pain which started recently.  Unable to lift as much as she used to in the past.  CT thoracolumbar spine shows severe central canal stenosis on the right side at level of L2-L3 and moderate on the left side.   -Dr. Jacqulyn Bath discussed with the patient that she may benefit from neurosurgery visit as outpatient for possible steroid injections or any other intervention and recommend follow-up with neurosurgery as outpatient.   -PT/OT recs for home health PT.   Type 2 diabetes mellitus: Recent hemoglobin A1c 01/12/2023 was 10.7.   -continue 50 units of long-acting insulin  -cont SSI.  Blood sugar controlled.   Hyperlipidemia: Continue atorvastatin.   Fluid overload:  -Recently observed to have gallon jug of water at bedside with pt admitting to drink the whole gallon daily. Pt was later placed on lasix gtt -Edema now improving.  Fluid overload improving.   -cont diuresis  as tolerated, on 40mg  IV lasix bid -continue 1500cc fluid restriction -diuresing well thus far -Peak wt 199.5kg -Wt today 186.3kg   History of PE: Stable.  Continue Xarelto.   Normocytic anemia: Stable.   Acquired hypothyroidism: Continue Synthroid.   Morbid obesity: Weight loss and dietary modification counseled.  May benefit from following up with the obesity clinic as outpatient.      Subjective: Still with LE edema. Overall improved  Physical Exam: Vitals:   02/17/23 2027 02/18/23 0509 02/18/23 0700 02/18/23 1306  BP: (!) 142/55 (!) 121/57  (!) 133/58  Pulse: (!) 57 68  60  Resp: 16 15  18   Temp: 98.5 F (36.9 C) 99 F (37.2 C)  98.6 F (37 C)  TempSrc: Oral Oral  Oral  SpO2: 98% (!) 86%  94%  Weight:   (!) 186.3 kg   Height:       General exam: Conversant, in no acute distress Respiratory system: normal chest rise, clear, no audible wheezing Cardiovascular system: regular rhythm, s1-s2 Gastrointestinal system: Nondistended, nontender, pos BS Central nervous system: No seizures, no tremors Extremities: No cyanosis, no joint deformities Skin: No rashes, no pallor Psychiatry: Affect normal // no auditory hallucinations   Data Reviewed:  Labs reviewed: Na 134, K 3.9, Cr 1.78, albumin 2.2   Family Communication: Pt in room, family not at bedside  Disposition: Status is: Inpatient Remains inpatient appropriate because: severity of illness  Planned Discharge Destination: Home with Home Health  Author: Rickey Barbara, MD 02/18/2023 5:41 PM  For on call review [email or web address].

## 2023-02-19 DIAGNOSIS — R600 Localized edema: Secondary | ICD-10-CM | POA: Diagnosis not present

## 2023-02-19 DIAGNOSIS — I2699 Other pulmonary embolism without acute cor pulmonale: Secondary | ICD-10-CM | POA: Diagnosis not present

## 2023-02-19 DIAGNOSIS — G8220 Paraplegia, unspecified: Secondary | ICD-10-CM | POA: Diagnosis not present

## 2023-02-19 LAB — GLUCOSE, CAPILLARY
Glucose-Capillary: 102 mg/dL — ABNORMAL HIGH (ref 70–99)
Glucose-Capillary: 110 mg/dL — ABNORMAL HIGH (ref 70–99)
Glucose-Capillary: 92 mg/dL (ref 70–99)
Glucose-Capillary: 82 mg/dL (ref 70–99)

## 2023-02-19 MED ORDER — ORAL CARE MOUTH RINSE: 15.0000 mL | OROMUCOSAL | Status: DC | PRN

## 2023-02-19 NOTE — Progress Notes (Addendum)
Physical Therapy Treatment Patient Details Name: Karen Bennett MRN: [id number] DOB: [birth year] Today's Date: 02/19/2023   History of Present Illness Patient is a 47 year old female presenting to [facility] ED on 7/19 with complaints of RLE pain and weakness. CT thoracolumbar spine shows severe central canal stenosis on the right side at level of L2-L3 and moderate on the left side. Of note recently hospitalized for pneumonia. PMH: DM, Obesity, HTN, Rt BKA, CKD3, HLD, hypothyroidism, hx of PE, CHF, R-BKA.    PT Comments   Pt admitted with above diagnosis.  Pt currently with functional limitations due to the deficits listed below (see PT Problem List). Pt in bed when PT and Rehab tech arrived. Pt sleepy however agreeable to getting OOB, pt indicated that her legs feel a little better. No reports of nausea or anxiousness today. PT set up for posterior scoot from bed to bariatric recliner. Pt with increased time and cues able to perform scoot transfer without physical assist. Pt left seated in recliner and all needs in place. Pt will benefit from acute skilled PT to increase their independence and safety with mobility to allow discharge.   PT returned in later in PM to assist pt back to bed. Pt in bed when PT arrived. Pt reported nurse was able to assist pt to return to bed. Pt states she was having some back pain. Nurse reports pt tolerated transfer well and 2 hrs OOB.    If plan is discharge home, recommend the following: Help with stairs or ramp for entrance;Assist for transportation;Two people to help with walking and/or transfers;Two people to help with bathing/dressing/bathroom   Can travel by private vehicle        Equipment Recommendations  Other (comment) Michiel Sites lift if going home and unable to perform transfer to power chair at mod I level)    Recommendations for Other Services       Precautions / Restrictions Precautions Precautions: Fall Precaution Comments: Hx of R-BKA, WC bound at  baseline (power chair)` Restrictions Weight Bearing Restrictions: No     Mobility  Bed Mobility Overal bed mobility: Independent Bed Mobility: Rolling, Supine to Sit Rolling: Modified independent (Device/Increase time)   Supine to sit: Modified independent (Device/Increase time) Sit to supine: Modified independent (Device/Increase time)   General bed mobility comments: pt is able to perform long sit in bed mod I with use of bed rails and use of momentum for rolling tasks    Transfers Overall transfer level: Needs assistance Equipment used: None Transfers: Bed to chair/wheelchair/BSC         Anterior-Posterior transfers: +2 safety/equipment, Supervision   General transfer comment: level surface AP scoot from bed to  bariatric recliner with close S x 2 for safety and stabilize recliner providing cues for encouragement, pt required cues for pursed lip breathing and to take theraputic rest breaks as needed    Ambulation/Gait               General Gait Details: Power chair at baseline   Stairs             Wheelchair Mobility     Tilt Bed    Modified Rankin (Stroke Patients Only)       Balance Overall balance assessment: Needs assistance Sitting-balance support:  (No UE support and LLE supported) Sitting balance-Leahy Scale: Good  Cognition Arousal/Alertness: Awake/alert Behavior During Therapy: WFL for tasks assessed/performed Overall Cognitive Status: Within Functional Limits for tasks assessed                                 General Comments: Pt remains tearful about difficulty with mobilization        Exercises      General Comments        Pertinent Vitals/Pain Pain Assessment Pain Assessment: 0-10 Pain Score: 6  Pain Location: bilateral legs, back Pain Descriptors / Indicators: Pressure, Tender, Discomfort, Constant Pain Intervention(s): Limited activity within  patient's tolerance, Monitored during session, Premedicated before session, Repositioned    Home Living Family/patient expects to be discharged to:: Private residence Living Arrangements: Alone Available Help at Discharge: Personal care attendant Type of Home: Apartment Home Access: Level entry       Home Layout: One level Home Equipment: Grab bars - tub/shower;Grab bars - toilet;Tub bench;Adaptive equipment;Wheelchair - power;Hand held shower head Additional Comments: M-F aide 3-6hours depending, alternate weekends    Prior Function            PT Goals (current goals can now be found in the care plan section) Acute Rehab PT Goals Patient Stated Goal: To go home and be able to transfer PT Goal Formulation: With patient Time For Goal Achievement: 02/24/23 Potential to Achieve Goals: Fair    Frequency    Min 1X/week      PT Plan Current plan remains appropriate    Co-evaluation              AM-PAC PT "6 Clicks" Mobility   Outcome Measure  Help needed turning from your back to your side while in a flat bed without using bedrails?: None Help needed moving from lying on your back to sitting on the side of a flat bed without using bedrails?: None Help needed moving to and from a bed to a chair (including a wheelchair)?: A Little Help needed standing up from a chair using your arms (e.g., wheelchair or bedside chair)?: Total Help needed to walk in hospital room?: Total Help needed climbing 3-5 steps with a railing? : Total 6 Click Score: 14    End of Session   Activity Tolerance: Patient limited by fatigue Patient left: with call bell/phone within reach;in bed Nurse Communication: Mobility status;Need for lift equipment (Pt will require hoyer to bariatric recliner at this time if nursing staff is to conduct transfers) PT Visit Diagnosis: Pain;Muscle weakness (generalized) (M62.81) Pain - Right/Left: Left (bilateral) Pain - part of body: Leg;Knee;Ankle and joints  of foot     Time: 1210-1234 PT Time Calculation (min) (ACUTE ONLY): 24 min  Charges:    $Therapeutic Activity: 23-37 mins PT General Charges $$ ACUTE PT VISIT: 1 Visit                     Johnny Bridge, PT Acute Rehab    Jacqualyn Posey 02/19/2023, 3:06 PM

## 2023-02-19 NOTE — Progress Notes (Signed)
Progress Note   Patient: Karen Bennett QIH:[id number] DOB: 03-26-1976 DOA: 02/09/2023     8 DOS: the patient was seen and examined on 02/19/2023   Brief hospital course: 47 y.o. female with medical history significant of morbid obesiety. Right BKA. Patient uses wheel chair to be mobile.  Patient reports for the last couple of weeks she is finding it increasingly difficult to lift her legs and get in and out of her wheelchair.  To an extent today that she could not do it at all and missed her appointment with the primary care provider.  Patient ascribes this to "swelling "of the legs and increased weight.  She denies any chest pain or shortness of breath that is new.  She denies any vomiting or diarrhea.  Patient has had upper mid back pain for the last 1 week that is not precipitated by trauma no aggravating or relieving factors identified.   Patient finally came to the ER today due to inability to function at home with her impairment  Assessment and Plan: Right leg pain/lumbar neuropathy: Patient has been complaining of right leg pain which started recently.  Unable to lift as much as she used to in the past.  CT thoracolumbar spine shows severe central canal stenosis on the right side at level of L2-L3 and moderate on the left side.   -Dr. Jacqulyn Bath discussed with the patient that she may benefit from neurosurgery visit as outpatient for possible steroid injections or any other intervention and recommend follow-up with neurosurgery as outpatient.   -PT/OT recs for home health PT.   Type 2 diabetes mellitus: Recent hemoglobin A1c 01/12/2023 was 10.7.   -continue 50 units of long-acting insulin  -cont SSI.  Blood sugar controlled.   Hyperlipidemia: Continue atorvastatin.   Fluid overload:  -Recently observed to have gallon jug of water at bedside with pt admitting to drink the whole gallon daily. Pt was later placed on lasix gtt -Edema now improving.  Fluid overload improving.   -cont diuresis  as tolerated, on 60mg  IV lasix bid -continue 1500cc fluid restriction -continues to diurese well thus far -Peak wt 199.5kg -recheck bmet in AM   History of PE: Stable.  Continue Xarelto.   Normocytic anemia: Stable.   Acquired hypothyroidism: Continue Synthroid.   Morbid obesity: Weight loss and dietary modification counseled.  May benefit from following up with the obesity clinic as outpatient.      Subjective: Without complaints. Eager to go home soon  Physical Exam: Vitals:   02/18/23 1306 02/18/23 2209 02/19/23 0532 02/19/23 1403  BP: (!) 133/58 (!) 129/56 (!) 146/50 (!) 142/66  Pulse: 60 (!) 59 (!) 53 (!) 54  Resp: 18 16 15 18   Temp: 98.6 F (37 C) 98.5 F (36.9 C) 98.5 F (36.9 C) 98.1 F (36.7 C)  TempSrc: Oral Oral Oral Oral  SpO2: 94% 91% 94% 97%  Weight:      Height:       General exam: Awake, laying in bed, in nad Respiratory system: Normal respiratory effort, no wheezing Cardiovascular system: regular rate, s1, s2 Gastrointestinal system: Soft, nondistended, positive BS Central nervous system: CN2-12 grossly intact, strength intact Extremities: Perfused, no clubbing, LE edema Skin: Normal skin turgor, no notable skin lesions seen Psychiatry: Mood normal // no visual hallucinations   Data Reviewed:  Labs reviewed: Na 135, K 4.0, Cr 1.74, WBC 5.1, Hgb 11.6  Family Communication: Pt in room, family not at bedside  Disposition: Status is: Inpatient Remains inpatient appropriate  because: severity of illness  Planned Discharge Destination: Home with Home Health    Author: Rickey Barbara, MD 02/19/2023 6:06 PM  For on call review [email or web address].

## 2023-02-19 NOTE — Plan of Care (Signed)

## 2023-02-20 DIAGNOSIS — R600 Localized edema: Secondary | ICD-10-CM | POA: Diagnosis not present

## 2023-02-20 DIAGNOSIS — G8220 Paraplegia, unspecified: Secondary | ICD-10-CM | POA: Diagnosis not present

## 2023-02-20 DIAGNOSIS — I2699 Other pulmonary embolism without acute cor pulmonale: Secondary | ICD-10-CM | POA: Diagnosis not present

## 2023-02-20 LAB — GLUCOSE, CAPILLARY
Glucose-Capillary: 80 mg/dL (ref 70–99)
Glucose-Capillary: 72 mg/dL (ref 70–99)
Glucose-Capillary: 109 mg/dL — ABNORMAL HIGH (ref 70–99)
Glucose-Capillary: 145 mg/dL — ABNORMAL HIGH (ref 70–99)

## 2023-02-20 MED ORDER — CHLORPROMAZINE HCL 25 MG PO TABS
25.0000 mg | ORAL_TABLET | Freq: Three times a day (TID) | ORAL | Status: DC | PRN
  Administered 2023-02-20: 25 mg via ORAL
  Filled 2023-02-20 (×2): qty 1

## 2023-02-20 MED ORDER — INSULIN GLARGINE-YFGN 100 UNIT/ML ~~LOC~~ SOLN
34.0000 [IU] | Freq: Every day | SUBCUTANEOUS | Status: DC
  Administered 2023-02-20: 34 [IU] via SUBCUTANEOUS
  Filled 2023-02-20 (×2): qty 0.34

## 2023-02-20 NOTE — Inpatient Diabetes Management (Signed)
Inpatient Diabetes Program Recommendations  AACE/ADA: New Consensus Statement on Inpatient Glycemic Control  Target Ranges:  Prepandial:   less than 140 mg/dL      Peak postprandial:   less than 180 mg/dL (1-2 hours)      Critically ill patients:  140 - 180 mg/dL    Latest Reference Range & Units 02/20/23 05:53  Glucose 70 - 99 mg/dL 69 (L)    Latest Reference Range & Units 02/19/23 08:09 02/19/23 11:57 02/19/23 17:57 02/19/23 21:05  Glucose-Capillary 70 - 99 mg/dL 161 (H) 096 (H) 92 82    Review of Glycemic Control  Current orders for Inpatient glycemic control: Semglee 40 units at bedtime, Novolog 0-15 units TID with meals, Novolog 0-5 units QHS  Inpatient Diabetes Program Recommendations:    Insulin: Lab glucose 69 mg/dl today. Please consider decreasing Semglee to 34 units at bedtime.  Thanks, Orlando Penner, RN, MSN, CDCES Diabetes Coordinator Inpatient Diabetes Program [phone] (Team Pager from 8am to 5pm)

## 2023-02-20 NOTE — Progress Notes (Signed)
Progress Note   Patient: Karen Bennett:[id number] DOB: May 27, 1976 DOA: 02/09/2023     9 DOS: the patient was seen and examined on 02/20/2023   Brief hospital course: 47 y.o. female with medical history significant of morbid obesiety. Right BKA. Patient uses wheel chair to be mobile.  Patient reports for the last couple of weeks she is finding it increasingly difficult to lift her legs and get in and out of her wheelchair.  To an extent today that she could not do it at all and missed her appointment with the primary care provider.  Patient ascribes this to "swelling "of the legs and increased weight.  She denies any chest pain or shortness of breath that is new.  She denies any vomiting or diarrhea.  Patient has had upper mid back pain for the last 1 week that is not precipitated by trauma no aggravating or relieving factors identified.   Patient finally came to the ER today due to inability to function at home with her impairment  Assessment and Plan: Right leg pain/lumbar neuropathy: Patient has been complaining of right leg pain which started recently.  Unable to lift as much as she used to in the past.  CT thoracolumbar spine shows severe central canal stenosis on the right side at level of L2-L3 and moderate on the left side.   -Dr. Jacqulyn Bath discussed with the patient that she may benefit from neurosurgery visit as outpatient for possible steroid injections or any other intervention and recommend follow-up with neurosurgery as outpatient.   -PT/OT recs for home health PT.   Type 2 diabetes mellitus: Recent hemoglobin A1c 01/12/2023 was 10.7.   -cont SSI -Recently noted to be more hypoglycemic. Semglee was decreased to 34 units   Hyperlipidemia: Continue atorvastatin.   Fluid overload:  -Recently observed to have gallon jug of water at bedside with pt admitting to drink the whole gallon daily. Pt was later placed on lasix gtt -Edema overall improved.  -Cr has risen to 1.99 today.  Lasix now held -continue 1500cc fluid restriction -continues to diurese well thus far -Peak wt 199.5kg -wt today 183.3kg -recheck bmet in AM   History of PE: Stable.  Continue Xarelto.   Normocytic anemia: Stable.   Acquired hypothyroidism: Continue Synthroid.   Morbid obesity: Weight loss and dietary modification counseled.  May benefit from following up with the obesity clinic as outpatient.      Subjective: Feeling better. Eager to go home soon  Physical Exam: Vitals:   02/20/23 0500 02/20/23 0536 02/20/23 0548 02/20/23 1259  BP:  (!) 146/84 106/66 (!) 142/52  Pulse:  67 69 (!) 57  Resp:  14 16 18   Temp:  98.7 F (37.1 C) 98.9 F (37.2 C) 98.7 F (37.1 C)  TempSrc:  Oral Oral Oral  SpO2:  93% 96% 90%  Weight: (!) 183.3 kg     Height:       General exam: Conversant, in no acute distress Respiratory system: normal chest rise, clear, no audible wheezing Cardiovascular system: regular rhythm, s1-s2 Gastrointestinal system: Nondistended, nontender, pos BS Central nervous system: No seizures, no tremors Extremities: No cyanosis, no joint deformities Skin: No rashes, no pallor Psychiatry: Affect normal // no auditory hallucinations   Data Reviewed:  Labs reviewed: Na 135, K 4.0, Cr 1.74, WBC 5.1, Hgb 11.6  Family Communication: Pt in room, family not at bedside  Disposition: Status is: Inpatient Remains inpatient appropriate because: severity of illness  Planned Discharge Destination: Home with Home  Health    Author: Rickey Barbara, MD 02/20/2023 4:04 PM  For on call review [email or web address].

## 2023-02-20 NOTE — Care Management Important Message (Signed)
Important Message  Patient Details IM Letter given. Name: Karen Bennett MRN: [id number] Date of Birth: [birth year]   Medicare Important Message Given:  Yes     [name] 02/20/2023, 9:02 AM

## 2023-02-20 NOTE — Plan of Care (Signed)

## 2023-02-21 DIAGNOSIS — G8220 Paraplegia, unspecified: Secondary | ICD-10-CM | POA: Diagnosis not present

## 2023-02-21 LAB — GLUCOSE, CAPILLARY
Glucose-Capillary: 113 mg/dL — ABNORMAL HIGH (ref 70–99)
Glucose-Capillary: 64 mg/dL — ABNORMAL LOW (ref 70–99)
Glucose-Capillary: 60 mg/dL — ABNORMAL LOW (ref 70–99)
Glucose-Capillary: 64 mg/dL — ABNORMAL LOW (ref 70–99)
Glucose-Capillary: 69 mg/dL — ABNORMAL LOW (ref 70–99)
Glucose-Capillary: 101 mg/dL — ABNORMAL HIGH (ref 70–99)
Glucose-Capillary: 93 mg/dL (ref 70–99)
Glucose-Capillary: 138 mg/dL — ABNORMAL HIGH (ref 70–99)

## 2023-02-21 MED ORDER — DEXTROSE 50 % IV SOLN
INTRAVENOUS | Status: AC
  Administered 2023-02-21: 50 mL
  Filled 2023-02-21: qty 50

## 2023-02-21 MED ORDER — INSULIN GLARGINE-YFGN 100 UNIT/ML ~~LOC~~ SOLN
20.0000 [IU] | Freq: Every day | SUBCUTANEOUS | Status: DC
  Administered 2023-02-21 – 2023-02-23 (×3): 20 [IU] via SUBCUTANEOUS
  Filled 2023-02-21 (×3): qty 0.2

## 2023-02-21 NOTE — Plan of Care (Signed)

## 2023-02-21 NOTE — Progress Notes (Addendum)
PROGRESS NOTE    Karen Bennett  XLK:[id number] DOB: 10-06-1975 DOA: 02/09/2023 PCP: [name], NP   Brief Narrative: 47 y.o. female with medical history significant of morbid obesiety. Right BKA. Patient uses wheel chair to be mobile.  Patient reports for the last couple of weeks she is finding it increasingly difficult to lift her legs and get in and out of her wheelchair.  To an extent today that she could not do it at all and missed her appointment with the primary care provider.  Patient ascribes this to "swelling "of the legs and increased weight.  She denies any chest pain or shortness of breath that is new.  She denies any vomiting or diarrhea.  Patient has had upper mid back pain for the last 1 week that is not precipitated by trauma no aggravating or relieving factors identified.   Patient finally came to the ER today due to inability to function at home with her impairment   Assessment and Plan:  Right leg pain Lumbar radiculopathy CT lumbar thoracolumbar spine with significant findings of central canal stenosis without compression.  Patient with associated weakness in lower extremities. -Obtain MRI of lumbar spine  Fluid overload Appears to be in part secondary to patient drinking too much fluids as she was seen to have gallon jug of water and patient admitting to drinking a gallon of water per day.  Patient started on Lasix IV with improvement of overall fluid overload.  Lasix IV stopped secondary to rising creatinine.  AKI on CKD stage IIIa Creatinine baseline appears to be around 1.2-1.4.  Peak creatinine of 1.99 in setting of IV diuresis.  Diuresis has been held. -BMP in a.m.  Diabetes mellitus type 2 Uncontrolled with hyperglycemia in addition to hypoglycemia during this admission. -Continue sliding scale insulin -Continue Semglee but decrease to 20 units qhs  Hyperlipidemia -Continue Lipitor 40 mg daily  History of PE -Continue Xarelto  Normocytic  anemia Stable  Hypothyroidism -Continue Synthroid  Morbid obesity Estimated body mass index is 65.51 kg/m as calculated from the following:   Height as of this encounter: 5\' 6"  (1.676 m).   Weight as of this encounter: 184.1 kg.   DVT prophylaxis: Xarelto Code Status:   Code Status: Full Code Family Communication: None at bedside Disposition Plan: Plan to discharge home likely in 24 hours ending improvement of creatinine   Consultants:  None  Procedures:    Antimicrobials: None   Subjective: Patient reports no issues this morning.  Feels well.  Objective: BP (!) 164/88 (BP Location: Left Arm)   Pulse 70   Temp 98.6 F (37 C) (Oral)   Resp 18   Ht 5\' 6"  (1.676 m)   Wt (!) 184.1 kg   LMP 04/13/2013   SpO2 93%   BMI 65.51 kg/m   Examination:  General exam: Appears calm and comfortable.  Morbidly obese. Respiratory system: Respiratory effort normal. Gastrointestinal system: Abdomen is nondistended, soft and nontender. Central nervous system: Alert and oriented. Psychiatry: Judgement and insight appear normal. Mood & affect appropriate.    Data Reviewed: I have personally reviewed following labs and imaging studies  CBC Lab Results  Component Value Date   WBC 5.0 02/21/2023   RBC 3.51 (L) 02/21/2023   HGB 9.9 (L) 02/21/2023   HCT 32.1 (L) 02/21/2023   MCV 91.5 02/21/2023   MCH 28.2 02/21/2023   PLT 245 02/21/2023   MCHC 30.8 02/21/2023   RDW 12.7 02/21/2023   LYMPHSABS 1.8 02/14/2023  MONOABS 0.5 02/14/2023   EOSABS 0.2 02/14/2023   BASOSABS 0.0 02/14/2023     Last metabolic panel Lab Results  Component Value Date   NA 135 02/21/2023   K 3.7 02/21/2023   CL 96 (L) 02/21/2023   CO2 31 02/21/2023   BUN 32 (H) 02/21/2023   CREATININE 1.99 (H) 02/21/2023   GLUCOSE 114 (H) 02/21/2023   GFRNONAA 31 (L) 02/21/2023   GFRAA >89 09/01/2013   CALCIUM 8.4 (L) 02/21/2023   PHOS 4.0 10/25/2021   PROT 6.5 02/21/2023   ALBUMIN 2.3 (L) 02/21/2023    BILITOT 0.8 02/21/2023   ALKPHOS 49 02/21/2023   AST 10 (L) 02/21/2023   ALT 8 02/21/2023   ANIONGAP 8 02/21/2023    GFR: Estimated Creatinine Clearance: 61.5 mL/min (A) (by C-G formula based on SCr of 1.99 mg/dL (H)).  No results found for this or any previous visit (from the past 240 hour(s)).    Radiology Studies: No results found.    LOS: 10 days    Jacquelin Hawking, MD [facility] 02/21/2023, 6:01 PM   If 7PM-7AM, please contact night-coverage [email or web address]

## 2023-02-21 NOTE — Inpatient Diabetes Management (Signed)
Inpatient Diabetes Program Recommendations  AACE/ADA: New Consensus Statement on Inpatient Glycemic Control (2015)  Target Ranges:  Prepandial:   less than 140 mg/dL      Peak postprandial:   less than 180 mg/dL (1-2 hours)      Critically ill patients:  140 - 180 mg/dL   Lab Results  Component Value Date   GLUCAP 64 (L) 02/21/2023   HGBA1C 10.7 (H) 01/12/2023    Review of Glycemic Control  Latest Reference Range & Units 02/20/23 07:49 02/20/23 11:45 02/20/23 17:09 02/20/23 21:59 02/21/23 07:48 02/21/23 11:48  Glucose-Capillary 70 - 99 mg/dL 80 72 528 (H) 413 (H) 244 (H) 64 (L)  (H): Data is abnormally high (L): Data is abnormally low  Diabetes history: DM2 Outpatient Diabetes medications: Semglee 50 units at bedtime, Humalog 0-12 TID Current orders for Inpatient glycemic control: Semglee 34 units at bedtime, Novolog 0-15 units TID  Inpatient Diabetes Program Recommendations:    Patient with hypoglycemia today at 11:48 of 64 mg/dL.  Please consider:  Semglee 25 units at bedtime (50% of home basal)  Will continue to follow while inpatient.  Thank you, Dulce Sellar, MSN, CDCES Diabetes Coordinator Inpatient Diabetes Program [phone] (team pager from 8a-5p)

## 2023-02-22 ENCOUNTER — Inpatient Hospital Stay (HOSPITAL_COMMUNITY): Payer: 59

## 2023-02-22 DIAGNOSIS — G8220 Paraplegia, unspecified: Secondary | ICD-10-CM | POA: Diagnosis not present

## 2023-02-22 LAB — GLUCOSE, CAPILLARY
Glucose-Capillary: 134 mg/dL — ABNORMAL HIGH (ref 70–99)
Glucose-Capillary: 107 mg/dL — ABNORMAL HIGH (ref 70–99)
Glucose-Capillary: 92 mg/dL (ref 70–99)
Glucose-Capillary: 105 mg/dL — ABNORMAL HIGH (ref 70–99)

## 2023-02-22 MED ORDER — LORAZEPAM 2 MG/ML IJ SOLN
1.0000 mg | Freq: Once | INTRAMUSCULAR | Status: AC | PRN
  Administered 2023-02-22: 1 mg via INTRAVENOUS
  Filled 2023-02-22: qty 1

## 2023-02-22 NOTE — Progress Notes (Signed)
PT Cancellation Note  Patient Details Name: BRIDGITT WARDELL MRN: [id number] DOB: 08/05/1975   Cancelled Treatment:    Reason Eval/Treat Not Completed: Patient waiting on MRI when checked on in AM. Will return as schedule allows. Blanchard Kelch PT Acute Rehabilitation Services Office [phone] Weekend pager-[phone]    Rada Hay 02/22/2023, 3:23 PM

## 2023-02-22 NOTE — Plan of Care (Signed)

## 2023-02-22 NOTE — Progress Notes (Signed)
PROGRESS NOTE    Karen Bennett  NGE:[id number] DOB: Aug 20, 1975 DOA: 02/09/2023 PCP: [name], NP   Brief Narrative: Karen Bennett is a 47 y.o. female with a history of morbid obesity, right BKA, diabetes mellitus, CKD stage IIIa, pulmonary embolism, anemia, hypothyroidism.  Patient presented secondary to inability to function secondary to worsening low back pain and decreased mobility.  During this admission she was found to have evidence of fluid overload and was started on IV Lasix for treatment.  CT imaging significant for evidence of canal stenosis without compression.   Assessment and Plan:  Right leg pain Lumbar radiculopathy CT lumbar thoracolumbar spine with significant findings of central canal stenosis without compression.  Patient with associated weakness in lower extremities. -MRI of lumbar spine pending  Fluid overload Appears to be in part secondary to patient drinking too much fluids as she was seen to have gallon jug of water and patient admitting to drinking a gallon of water per day.  Patient started on Lasix IV with improvement of overall fluid overload.  Lasix IV stopped secondary to rising creatinine.  AKI on CKD stage IIIa Creatinine baseline appears to be around 1.2-1.4.  Peak creatinine of 1.99 in setting of IV diuresis.  Diuresis has been held.  Creatinine down to 1.96 today. -BMP in a.m.  Diabetes mellitus type 2 Uncontrolled with hyperglycemia in addition to hypoglycemia during this admission. -Continue sliding scale insulin -Continue Semglee but decrease to 20 units qhs  Hyperlipidemia -Continue Lipitor 40 mg daily  History of PE -Continue Xarelto  Normocytic anemia Stable  Hypothyroidism -Continue Synthroid  Morbid obesity Estimated body mass index is 65.94 kg/m as calculated from the following:   Height as of this encounter: 5\' 6"  (1.676 m).   Weight as of this encounter: 185.3 kg.   DVT prophylaxis: Xarelto Code Status:    Code Status: Full Code Family Communication: None at bedside Disposition Plan: Plan to discharge home likely in 24 hours ending improvement of creatinine and MRI results   Consultants:  None  Procedures:    Antimicrobials: None   Subjective: No concerns this morning.  Objective: BP (!) 148/56 (BP Location: Left Arm)   Pulse 62   Temp 98.5 F (36.9 C)   Resp 18   Ht 5\' 6"  (1.676 m)   Wt (!) 185.3 kg   LMP 04/13/2013   SpO2 91%   BMI 65.94 kg/m   Examination:  General exam: Appears calm and comfortable    Data Reviewed: I have personally reviewed following labs and imaging studies  CBC Lab Results  Component Value Date   WBC 5.0 02/21/2023   RBC 3.51 (L) 02/21/2023   HGB 9.9 (L) 02/21/2023   HCT 32.1 (L) 02/21/2023   MCV 91.5 02/21/2023   MCH 28.2 02/21/2023   PLT 245 02/21/2023   MCHC 30.8 02/21/2023   RDW 12.7 02/21/2023   LYMPHSABS 1.8 02/14/2023   MONOABS 0.5 02/14/2023   EOSABS 0.2 02/14/2023   BASOSABS 0.0 02/14/2023     Last metabolic panel Lab Results  Component Value Date   NA 134 (L) 02/22/2023   K 3.9 02/22/2023   CL 95 (L) 02/22/2023   CO2 30 02/22/2023   BUN 31 (H) 02/22/2023   CREATININE 1.96 (H) 02/22/2023   GLUCOSE 158 (H) 02/22/2023   GFRNONAA 32 (L) 02/22/2023   GFRAA >89 09/01/2013   CALCIUM 8.4 (L) 02/22/2023   PHOS 4.0 10/25/2021   PROT 6.5 02/21/2023   ALBUMIN 2.3 (L)  02/21/2023   BILITOT 0.8 02/21/2023   ALKPHOS 49 02/21/2023   AST 10 (L) 02/21/2023   ALT 8 02/21/2023   ANIONGAP 9 02/22/2023    GFR: Estimated Creatinine Clearance: 62.8 mL/min (A) (by C-G formula based on SCr of 1.96 mg/dL (H)).  No results found for this or any previous visit (from the past 240 hour(s)).    Radiology Studies: No results found.    LOS: 11 days    Jacquelin Hawking, MD [facility] 02/22/2023, 2:09 PM   If 7PM-7AM, please contact night-coverage [email or web address]

## 2023-02-23 DIAGNOSIS — G8220 Paraplegia, unspecified: Secondary | ICD-10-CM | POA: Diagnosis not present

## 2023-02-23 LAB — GLUCOSE, CAPILLARY
Glucose-Capillary: 102 mg/dL — ABNORMAL HIGH (ref 70–99)
Glucose-Capillary: 104 mg/dL — ABNORMAL HIGH (ref 70–99)

## 2023-02-23 MED ORDER — ATORVASTATIN CALCIUM 40 MG PO TABS: 40.0000 mg | ORAL_TABLET | Freq: Every day | ORAL | 2 refills | Status: DC

## 2023-02-23 MED ORDER — SEMGLEE 100 UNIT/ML ~~LOC~~ SOPN: 10.0000 [IU] | PEN_INJECTOR | Freq: Every day | SUBCUTANEOUS | Status: DC

## 2023-02-23 MED ORDER — FUROSEMIDE 20 MG PO TABS: 20.0000 mg | ORAL_TABLET | ORAL | 0 refills | Status: DC

## 2023-02-23 MED ORDER — LEVOTHYROXINE SODIUM 50 MCG PO TABS: 50.0000 ug | ORAL_TABLET | Freq: Every day | ORAL | 0 refills | Status: DC

## 2023-02-23 MED ORDER — SEMGLEE 100 UNIT/ML ~~LOC~~ SOPN: 10.0000 [IU] | PEN_INJECTOR | Freq: Every day | SUBCUTANEOUS | 0 refills | Status: DC

## 2023-02-23 MED ORDER — XARELTO 20 MG PO TABS: 20.0000 mg | ORAL_TABLET | Freq: Every morning | ORAL | 2 refills | Status: DC

## 2023-02-23 MED ORDER — BUPROPION HCL ER (SR) 100 MG PO TB12: 100.0000 mg | ORAL_TABLET | Freq: Every morning | ORAL | 0 refills | Status: DC

## 2023-02-23 NOTE — TOC Progression Note (Signed)
Transition of Care [facility]) - Progression Note    Patient Details  Name: Karen Bennett MRN: [id number] Date of Birth: [birth year]  [facility]) CM/SW Contact  Beckie Busing, RN Phone Number:[phone]  02/23/2023, 3:52 PM  Clinical Narrative:    [employer] life previously ordered per [employer]. CM followed up with [employer]. DME will be delivered today.    Expected Discharge Plan: Home w Home Health Services Barriers to Discharge: Continued Medical Work up  Expected Discharge Plan and Services In-house Referral: NA Discharge Planning Services: CM Consult Post Acute Care Choice: Resumption of Svcs/PTA Provider, Durable Medical Equipment Living arrangements for the past 2 months: Apartment Expected Discharge Date: 02/23/23               DME Arranged:  [employer]) DME Agency: [employer] Date DME Agency Contacted: 02/13/23 Time DME Agency Contacted: [phone] Representative spoke with at DME Agency: Shaune Leeks HH Arranged: Refused HH (Patient states that she will be fine once her swelling is down in her legs.) HH Agency: NA         Social Determinants of Health (SDOH) Interventions SDOH Screenings   Food Insecurity: No Food Insecurity (02/09/2023)  Housing: Patient Declined (02/09/2023)  Transportation Needs: No Transportation Needs (02/09/2023)  Utilities: Not At Risk (02/09/2023)  Financial Resource Strain: High Risk (03/30/2021)   Received from [facility], [facility]  Physical Activity: Inactive (03/30/2021)   Received from [facility], [facility]  Social Connections: Unknown (11/24/2021)   Received [facility], [facility]  Stress: Stress Concern Present (03/30/2021)   Received from [facility], [facility]  Tobacco Use: Low Risk  (02/09/2023)    Readmission Risk Interventions    02/13/2023   11:23 AM 01/26/2023    3:33 PM 01/16/2023   11:06 AM  Readmission Risk Prevention Plan  Transportation Screening Complete Complete Complete  PCP  or Specialist Appt within 5-7 Days   Complete  PCP or Specialist Appt within 3-5 Days Complete Complete   Home Care Screening   Complete  Medication Review (RN CM)   Complete  HRI or Home Care Consult Complete Complete   Social Work Consult for Recovery Care Planning/Counseling Complete Complete   Palliative Care Screening Complete Not Applicable   Medication Review Oceanographer) Complete Complete

## 2023-02-23 NOTE — Discharge Summary (Incomplete)
Physician Discharge Summary   Patient: Karen Bennett MRN: [id number] DOB: 10/29/1975  Admit date:     02/09/2023  Discharge date: 02/23/23  Discharge Physician: Jacquelin Hawking, MD   PCP: [name], NP   Recommendations at discharge:  {Tip this will not be part of the note when signed- Example include specific recommendations for outpatient follow-up, pending tests to follow-up on. (Optional):26781}  ***  Discharge Diagnoses: Principal Problem:   Paraplegia (HCC) Active Problems:   Normocytic anemia   History of pulmonary embolism   CKD (chronic kidney disease)  Resolved Problems:   * No resolved hospital problems. *  Hospital Course: Karen Bennett is a 47 y.o. female with a history of morbid obesity, right BKA, diabetes mellitus, CKD stage IIIa, pulmonary embolism, anemia, hypothyroidism.  Patient presented secondary to inability to function secondary to worsening low back pain and decreased mobility.  During this admission she was found to have evidence of fluid overload and was started on IV Lasix for treatment.  CT imaging significant for evidence of canal stenosis without compression.  Assessment and Plan: * Paraplegia (HCC) Is not a confirmed diagnosis.  Patient's strength testing is extremely difficult to do in the context of her disproportionate soft tissue to muscle mass.  Given that patient is reporting new functional decline I we will get physical therapy evaluation as well as spine CAT scan because patient is reporting some upper mid back pain.  Patient reports that her functional status has happened over the course of 2 weeks  Normocytic anemia Anemia panel ordered  History of pulmonary embolism Is a remote diagnosis with recent recurrence due to interruption of Xarelto therapy by patient.  Continue with Xarelto.  CKD (chronic kidney disease) Creatinine seems to be at baseline.  I suspect this is the reason for patient's tendency to get fluid overloaded.   Her recent echo from January 12, 2023 shows a low normal function of the left ventricular.  At this time during my clinical evaluation I am not convinced that the patient has marked anasarca or marked fluid overload.  We will monitor patient's risk clinical response to single dose Lasix given in the ER.  Will also check the patient's weight and compared to the last documented weight to see if patient has gained marked amount of weight that is not apparent on physical exam.  Continue with home oral dose of oral dose of Lasix      {Tip this will not be part of the note when signed Body mass index is 66.34 kg/m. ,  Nutrition Documentation    Flowsheet Row ED to Hosp-Admission (Current) from 02/09/2023 in [facility]  Nutrition Problem Food and nutrition related knowledge deficit  Etiology limited prior education  Nutrition Goal Patient will meet greater than or equal to 90% of their needs  Interventions Refer to RD note for recommendations, Education     ,  (Optional):26781}  {(NOTE) Pain control PDMP Statment (Optional):26782} Consultants: *** Procedures performed: ***  Disposition: {Plan; Disposition:26390} Diet recommendation:  {Diet_Plan:26776} DISCHARGE MEDICATION: Allergies as of 02/23/2023       Reactions   Clindamycin Diarrhea, Nausea And Vomiting   Zofran Itching, Nausea And Vomiting   Doxycycline Diarrhea, Nausea And Vomiting   Morphine And Codeine Hives, Itching, Rash        Medication List     TAKE these medications    acetaminophen 500 MG tablet Commonly known as: TYLENOL Take 500 mg by mouth every 6 (six)  hours as needed for moderate pain.   atorvastatin 40 MG tablet Commonly known as: LIPITOR Take 1 tablet (40 mg total) by mouth daily at 6 PM.   BIOTIN PO Take 2 capsules by mouth daily. Unsure of dose   buPROPion ER 100 MG 12 hr tablet Commonly known as: WELLBUTRIN SR Take 100 mg by mouth every morning.   diazepam 5 MG tablet Commonly  known as: VALIUM Take 5 mg by mouth every 8 (eight) hours as needed for anxiety.   furosemide 20 MG tablet Commonly known as: Lasix Take 1 tablet (20 mg total) by mouth every other day.   glucose blood test strip Use as instructed   glucose monitoring kit monitoring kit 1 each by Does not apply route as needed for other.   HumaLOG KwikPen 100 UNIT/ML KwikPen Generic drug: insulin lispro 0-12 Units 3 (three) times daily. Inject subcutaneously twice daily as per sliding scale: if 70-150= 0 units; 151-200= 2 u; 201-250= 4 u; 251-300= 6 u; 301-350= 8 u; 351-400= 10 u; 401-450= 12u; anything greater than 400 call physician.   hydrOXYzine 50 MG capsule Commonly known as: VISTARIL Take 50 mg by mouth every 8 (eight) hours as needed for itching.   levothyroxine 50 MCG tablet Commonly known as: SYNTHROID Take 1 tablet (50 mcg total) by mouth daily at 6 (six) AM.   metoCLOPramide 5 MG tablet Commonly known as: REGLAN Take 1 tablet (5 mg total) by mouth every 6 (six) hours as needed for nausea.   oxyCODONE 15 MG immediate release tablet Commonly known as: ROXICODONE Take 1 tablet (15 mg total) by mouth every 6 (six) hours as needed for pain.   promethazine 25 MG tablet Commonly known as: PHENERGAN Take 25 mg by mouth 2 (two) times daily as needed for nausea/vomiting or nausea.   Semglee (yfgn) 100 UNIT/ML Pen Generic drug: insulin glargine Inject 10 Units into the skin at bedtime. What changed: how much to take   traZODone 150 MG tablet Commonly known as: DESYREL Take 1 tablet (150 mg total) by mouth at bedtime.   Xarelto 20 MG Tabs tablet Generic drug: rivaroxaban Take 1 tablet (20 mg total) by mouth in the morning. Start after finishing Xarelto started pack What changed: Another medication with the same name was removed. Continue taking this medication, and follow the directions you see here.               Durable Medical Equipment  (From admission, onward)            Start     Ordered   02/13/23 1226  For home use only DME Other see comment  Once       Comments: Michiel Sites Lift  Question:  Length of Need  Answer:  12 Months   02/13/23 1225            Follow-up Information     [name], NP. Schedule an appointment as soon as possible for a visit in 1 week(s).   Specialty: Adult Health Nurse Practitioner Why: For hospital follow-up Contact information: [address] [place] [facility] [place] [address] [phone]                Discharge Exam: Ceasar Mons Weights   02/21/23 0500 02/22/23 0500 02/23/23 0500  Weight: (!) 184.1 kg (!) 185.3 kg (!) 186.4 kg   ***  Condition at discharge: {DC Condition:26389}  The results of significant diagnostics from this hospitalization (including imaging, microbiology, ancillary and  laboratory) are listed below for reference.   Imaging Studies: MR LUMBAR SPINE WO CONTRAST  Result Date: 02/23/2023 CLINICAL DATA:  Initial evaluation for spinal stenosis. EXAM: MRI LUMBAR SPINE WITHOUT CONTRAST TECHNIQUE: Multiplanar, multisequence MR imaging of the lumbar spine was performed. No intravenous contrast was administered. COMPARISON:  Prior CT from 02/09/2023. FINDINGS: Segmentation: Standard. Lowest well-formed disc space labeled the L5-S1 level. Alignment: Chronic bilateral pars defects at L5 with associated 7 mm spondylolisthesis of L5 on S1. Trace 3 mm retrolisthesis of T11 on T12, with 2 mm retrolisthesis of L2 on L3. Vertebrae: Vertebral body height maintained with no other acute or chronic fracture. Underlying bone marrow signal intensity within normal limits. No worrisome osseous lesions. Mild reactive endplate change about the L5-S1 interspace. No other significant abnormal marrow edema. Conus medullaris and cauda equina: Conus extends to the L1-2 level. Conus and cauda equina appear normal. Paraspinal and other soft tissues: Diffuse edema throughout the subcutaneous fat of the lower back,  nonspecific, but suspected to be related to overall volume status. Unilateral right renal atrophy. Subcentimeter right renal cyst noted, benign in appearance, no follow-up imaging recommended. Disc levels: T11-12: Seen only on sagittal projection. Trace retrolisthesis. Disc desiccation with diffuse disc bulge. Left worse than right facet hypertrophy. No significant spinal stenosis. Moderate bilateral foraminal narrowing. T12-L1: Normal interspace.  Mild facet hypertrophy.  No stenosis. L1-2: Normal interspace. Mild facet hypertrophy. No spinal stenosis. Foramina remain patent. L2-3: Trace retrolisthesis. Disc desiccation with diffuse disc bulge. Superimposed small right foraminal disc protrusion closely approximates the exiting right L2 nerve root (series 5, image 7). Right worse than left facet hypertrophy. Resultant mild canal with moderate right lateral recess stenosis. Moderate right with mild left L2 foraminal narrowing. L3-4: Disc desiccation with mild disc bulge. Superimposed small right foraminal to extraforaminal disc protrusion closely approximates the exiting right L3 nerve root (series 9, image 23). Mild facet hypertrophy. Resultant mild spinal stenosis. Moderate right with mild left L3 foraminal narrowing. L4-5: Minimal annular disc bulge. Moderate bilateral facet arthrosis. No significant spinal stenosis. Mild to moderate bilateral L4 foraminal narrowing. L5-S1: Chronic bilateral pars defects with 7 mm spondylolisthesis. Advanced degenerative intervertebral disc space narrowing with disc desiccation and broad posterior pseudo disc bulge/uncovering. Associated reactive endplate spurring. No significant spinal stenosis. Severe bilateral L5 foraminal narrowing, largely due to slippage. IMPRESSION: 1. 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 2. Disc bulging with small right-sided disc protrusions and facet  hypertrophy at L2-3 and L3-4, resulting in mild canal with moderate right lateral recess stenosis, with moderate right L2 and L3 foraminal narrowing. 3. Moderate facet hypertrophy at L4-5 with resultant mild to moderate bilateral L4 foraminal stenosis. 4. Asymmetric right renal atrophy. Electronically Signed   By: Rise Mu M.D.   On: 02/23/2023 03:39   CT LUMBAR SPINE WO CONTRAST  Result Date: 02/09/2023 CLINICAL DATA:  Metastatic disease for paraplegia. EXAM: CT LUMBAR SPINE WITHOUT CONTRAST TECHNIQUE: Multidetector CT imaging of the lumbar spine was performed without intravenous contrast administration. Multiplanar CT image reconstructions were also generated. RADIATION DOSE REDUCTION: This exam was performed according to the departmental dose-optimization program which includes automated exposure control, adjustment of the mA and/or kV according to patient size and/or use of iterative reconstruction technique. COMPARISON:  CT angiogram chest 12/26/2022. CT abdomen and pelvis 09/03/2021. FINDINGS: THORACIC SPINE: Alignment: Normal. Vertebrae: No acute fracture or focal pathologic process. Paraspinal and other soft tissues:  Negative. No central spinal canal abnormality identified on this noncontrast study. Disc levels: Disc spaces are maintained. There is no central canal or neural foraminal stenosis at any level. Other: Moderate right and small left pleural effusions are present. There is airspace consolidation in the right lower lobe similar to prior. LUMBAR SPINE: Segmentation: 5 lumbar type vertebrae. Alignment: There is 5 mm of anterolisthesis at L5-S1. Alignment is otherwise anatomic. This is unchanged from prior. Vertebrae: No acute fracture or focal osseous lesion. There severe degenerative changes at L5-S1 with vacuum disc phenomena and disc space height loss similar to the prior study. Paraspinal and other soft tissues: Negative. Disc levels: T12-L1: Within normal limits. L1-L2: Bilateral  facet arthropathy. Mild bilateral neural foraminal stenosis. No central canal stenosis. L2-L3: Bilateral facet arthropathy and disc bulge. Thickening of the ligamentum flavum. Moderate severe central canal stenosis. Severe right and moderate left neural foraminal stenosis. L3-L4: Broad-based disc bulge and bilateral facet arthropathy. Mild central canal stenosis. Moderate right and mild left neural foraminal stenosis. L4-L5: Bilateral facet arthropathy and disc bulge. Mild central canal stenosis. Severe left and moderate right neural foraminal stenosis. L5-S1: Bilateral facet arthropathy. Moderate right and mild left neural foraminal stenosis. No central canal stenosis. Other: There is calcified atherosclerotic disease throughout the aorta. Right ureteral stent is present. IMPRESSION: 1. Thoracic spine appears within normal limits. 2. No acute fracture or traumatic subluxation of the lumbar spine. 3. Multilevel degenerative changes of the lumbar spine, most severe at L2-L3 where there is moderate to severe central canal stenosis, severe right and moderate left neural foraminal stenosis. 4. Additional levels of lumbar neural foraminal stenosis and central canal stenosis as described above. 5. Moderate right and small left pleural effusions. Electronically Signed   By: Darliss Cheney M.D.   On: 02/09/2023 23:17   CT THORACIC SPINE WO CONTRAST  Result Date: 02/09/2023 CLINICAL DATA:  Metastatic disease for paraplegia. EXAM: CT LUMBAR SPINE WITHOUT CONTRAST TECHNIQUE: Multidetector CT imaging of the lumbar spine was performed without intravenous contrast administration. Multiplanar CT image reconstructions were also generated. RADIATION DOSE REDUCTION: This exam was performed according to the departmental dose-optimization program which includes automated exposure control, adjustment of the mA and/or kV according to patient size and/or use of iterative reconstruction technique. COMPARISON:  CT angiogram chest  12/26/2022. CT abdomen and pelvis 09/03/2021. FINDINGS: THORACIC SPINE: Alignment: Normal. Vertebrae: No acute fracture or focal pathologic process. Paraspinal and other soft tissues: Negative. No central spinal canal abnormality identified on this noncontrast study. Disc levels: Disc spaces are maintained. There is no central canal or neural foraminal stenosis at any level. Other: Moderate right and small left pleural effusions are present. There is airspace consolidation in the right lower lobe similar to prior. LUMBAR SPINE: Segmentation: 5 lumbar type vertebrae. Alignment: There is 5 mm of anterolisthesis at L5-S1. Alignment is otherwise anatomic. This is unchanged from prior. Vertebrae: No acute fracture or focal osseous lesion. There severe degenerative changes at L5-S1 with vacuum disc phenomena and disc space height loss similar to the prior study. Paraspinal and other soft tissues: Negative. Disc levels: T12-L1: Within normal limits. L1-L2: Bilateral facet arthropathy. Mild bilateral neural foraminal stenosis. No central canal stenosis. L2-L3: Bilateral facet arthropathy and disc bulge. Thickening of the ligamentum flavum. Moderate severe central canal stenosis. Severe right and moderate left neural foraminal stenosis. L3-L4: Broad-based disc bulge and bilateral facet arthropathy. Mild central canal stenosis. Moderate right and mild left neural foraminal stenosis. L4-L5: Bilateral facet arthropathy and disc bulge. Mild  central canal stenosis. Severe left and moderate right neural foraminal stenosis. L5-S1: Bilateral facet arthropathy. Moderate right and mild left neural foraminal stenosis. No central canal stenosis. Other: There is calcified atherosclerotic disease throughout the aorta. Right ureteral stent is present. IMPRESSION: 1. Thoracic spine appears within normal limits. 2. No acute fracture or traumatic subluxation of the lumbar spine. 3. Multilevel degenerative changes of the lumbar spine, most  severe at L2-L3 where there is moderate to severe central canal stenosis, severe right and moderate left neural foraminal stenosis. 4. Additional levels of lumbar neural foraminal stenosis and central canal stenosis as described above. 5. Moderate right and small left pleural effusions. Electronically Signed   By: Darliss Cheney M.D.   On: 02/09/2023 23:17   DG Chest Port 1 View  Result Date: 02/09/2023 CLINICAL DATA:  Shortness of breath. EXAM: PORTABLE CHEST 1 VIEW COMPARISON:  January 24, 2023 FINDINGS: The cardiac silhouette is mildly enlarged and unchanged in size. Mild right basilar atelectasis and/or infiltrate is noted. This is mildly decreased in severity when compared to the prior study. No pleural effusion or pneumothorax is identified. The visualized skeletal structures are unremarkable. IMPRESSION: Mild right basilar atelectasis and/or infiltrate, mildly decreased in severity when compared to the prior study. Electronically Signed   By: Aram Candela M.D.   On: 02/09/2023 17:19   VAS [place] LOWER EXTREMITY VENOUS (DVT)  Result Date: 01/26/2023  Lower Venous DVT Study Patient Name:  DELONA CLASBY  Date of Exam:   01/26/2023 Medical Rec #: [id number]        Accession #:    [id number] Date of Birth: 06/22/1976       Patient Gender: F Patient Age:   64 years Exam Location:  [facility] Procedure:      VAS [place] LOWER EXTREMITY VENOUS (DVT) Referring Phys: Sibyl Parr LAMA --------------------------------------------------------------------------------  Indications: Pulmonary embolism.  Risk Factors: History of PE (2009 & 2022). Anticoagulation: Xarelto - noncompliant. Limitations: Body habitus and poor ultrasound/tissue interface. Comparison Study: Previous exam on 03/11/18 was positive for chronic LLE DVT. Performing Technologist: Ernestene Mention RVT, RDMS  Examination Guidelines: A complete evaluation includes B-mode imaging, spectral Doppler, color Doppler, and power Doppler as needed of all accessible  portions of each vessel. Bilateral testing is considered an integral part of a complete examination. Limited examinations for reoccurring indications may be performed as noted. The reflux portion of the exam is performed with the patient in reverse Trendelenburg.  +--------+---------------+---------+-----------+----------+--------------------+ RIGHT   CompressibilityPhasicitySpontaneityPropertiesThrombus Aging       +--------+---------------+---------+-----------+----------+--------------------+ CFV     Full           Yes      Yes                                       +--------+---------------+---------+-----------+----------+--------------------+ SFJ     Full                                                              +--------+---------------+---------+-----------+----------+--------------------+ FV Prox Full           Yes      Yes                                       +--------+---------------+---------+-----------+----------+--------------------+  FV Mid  Full           Yes      Yes                                       +--------+---------------+---------+-----------+----------+--------------------+ FV                     Yes      Yes                  patent by            Distal                                               color/doppler        +--------+---------------+---------+-----------+----------+--------------------+ PFV     Full                                                              +--------+---------------+---------+-----------+----------+--------------------+ POP     Full           Yes      Yes                                       +--------+---------------+---------+-----------+----------+--------------------+ Peroneal and posterior tibial veins not visualized (BKA and subcutaneous edema).  +--------+---------------+---------+-----------+----------+--------------------+ LEFT     CompressibilityPhasicitySpontaneityPropertiesThrombus Aging       +--------+---------------+---------+-----------+----------+--------------------+ CFV     Full           Yes      Yes                                       +--------+---------------+---------+-----------+----------+--------------------+ SFJ     Full                                                              +--------+---------------+---------+-----------+----------+--------------------+ FV Prox Full                                                              +--------+---------------+---------+-----------+----------+--------------------+ FV Mid                                               Not visualized       +--------+---------------+---------+-----------+----------+--------------------+ FV  Yes      Yes                  patent by            Distal                                               color/doppler        +--------+---------------+---------+-----------+----------+--------------------+ PFV     Full                                                              +--------+---------------+---------+-----------+----------+--------------------+ POP     Full           Yes      Yes                                       +--------+---------------+---------+-----------+----------+--------------------+ PTV                                                  not visualized       +--------+---------------+---------+-----------+----------+--------------------+ PERO                                                 not visualized       +--------+---------------+---------+-----------+----------+--------------------+ FV distal not well visualized. FV mid, peroneal, and posterior tibial vein not visualized on this exam. Subcutaneous edema through extremity.    Summary: BILATERAL: -No evidence of popliteal cyst, bilaterally. -Subcutaneous edema bilaterally L > R RIGHT: -  Portions of this examination were limited- see technologist comments above. - There is no evidence of deep vein thrombosis in the lower extremity. However, portions of this examination were limited- see technologist comments above.  LEFT: - There is no evidence of deep vein thrombosis in the lower extremity. However, portions of this examination were limited- see technologist comments above.  *See table(s) above for measurements and observations. Electronically signed by Lemar Livings MD on 01/26/2023 at 2:36:17 PM.    Final    CT Angio Chest PE W and/or Wo Contrast  Result Date: 01/25/2023 CLINICAL DATA:  Chest and rib pain for a few weeks. Shortness of breath. Recent diagnosis of CHF. EXAM: CT ANGIOGRAPHY CHEST WITH CONTRAST TECHNIQUE: Multidetector CT imaging of the chest was performed using the standard protocol during bolus administration of intravenous contrast. Multiplanar CT image reconstructions and MIPs were obtained to evaluate the vascular anatomy. RADIATION DOSE REDUCTION: This exam was performed according to the departmental dose-optimization program which includes automated exposure control, adjustment of the mA and/or kV according to patient size and/or use of iterative reconstruction technique. CONTRAST:  OMNIPAQUE IOHEXOL 350 MG/ML SOLN COMPARISON:  Chest radiograph 01/24/2023 and CTA chest 02/01/2021 FINDINGS: Cardiovascular: Cardiomegaly. Reflux of contrast into the IVC and hepatic veins compatible with  elevated right heart pressures. Extensive coronary artery calcification. Aortic atherosclerotic calcification. Acute nonocclusive pulmonary embolism within the distal right main pulmonary artery extending into the right lower lobe segmental branches. Dilated main pulmonary artery measuring 38 mm. No evidence of right heart strain. No pericardial effusion. Mediastinum/Nodes: Unchanged 22 mm ovoid lesion anterior to the trachea at the thoracic inlet. This is stable since 2009 and could be a  lymph node or parathyroid adenoma. Stable prominent mediastinal lymph nodes. Unremarkable trachea and esophagus. Lungs/Pleura: Respiratory motion obscures detail. Mosaic attenuation compatible with air trapping. Patchy airspace opacities bilaterally greatest in the right lower lobe. Moderate right and small left pleural effusions. No pneumothorax. Upper Abdomen: No acute abnormality. Musculoskeletal: No acute abnormality. Review of the MIP images confirms the above findings. IMPRESSION: 1. Nonocclusive pulmonary embolism within the distal right main pulmonary artery extending into the right lower lobe segmental branches. No evidence of right heart strain. 2. Patchy airspace opacities bilaterally greatest in the right lower lobe, concerning for pneumonia. 3. Moderate right and small left pleural effusions. 4. Cardiomegaly with evidence of elevated right heart pressures. 5. Dilated main pulmonary artery, as can be seen in pulmonary hypertension. 6. Extensive coronary artery calcification, greater than expected for age. Aortic Atherosclerosis (ICD10-I70.0). Critical Value/emergent results were called by telephone at the time of interpretation on 01/25/2023 at 1:36 am to provider Dr. Adela Lank, who verbally acknowledged these results. Electronically Signed   By: Minerva Fester M.D.   On: 01/25/2023 01:38   DG Chest 2 View  Result Date: 01/24/2023 CLINICAL DATA:  Shortness of breath EXAM: CHEST - 2 VIEW COMPARISON:  01/11/2023 FINDINGS: Mild patchy right lower lobe opacity, suspicious for pneumonia. Small right pleural effusion. No pneumothorax. The heart is normal in size. IMPRESSION: Mild patchy right lower lobe opacity, suspicious for pneumonia. Small right pleural effusion. Electronically Signed   By: Charline Bills M.D.   On: 01/24/2023 21:41    Microbiology: Results for orders placed or performed during the hospital encounter of 01/24/23  Blood culture (routine x 2)     Status: None   Collection Time:  01/25/23  2:57 AM   Specimen: BLOOD RIGHT ARM  Result Value Ref Range Status   Specimen Description   Final    BLOOD RIGHT ARM Performed at [facility], [address] [place], [place] [address]    Special Requests   Final    BOTTLES DRAWN AEROBIC AND ANAEROBIC Blood Culture adequate volume Performed at [facility], [address] [place], [place] [address]    Culture   Final    NO GROWTH 5 DAYS Performed at [facility], [address], [place], [place] [address]    Report Status 01/30/2023 FINAL  Final  Blood culture (routine x 2)     Status: None   Collection Time: 01/25/23  3:40 AM   Specimen: BLOOD LEFT ARM  Result Value Ref Range Status   Specimen Description   Final    BLOOD LEFT ARM Performed at [facility], [address], [place], [place] [address]    Special Requests   Final    BOTTLES DRAWN AEROBIC ONLY Blood Culture results may not be optimal due to an inadequate volume of blood received in culture bottles Performed at [facility], [address], [place], [place] [address]    Culture   Final    NO GROWTH 5 DAYS Performed at [facility], [address]  [place], [place] [address]    Report Status 01/30/2023 FINAL  Final    Labs: CBC: Recent Labs  Lab 02/17/23 0835 02/18/23 0756 02/19/23 0646 02/21/23 0603  WBC 5.3 5.5 5.1 5.0  HGB 10.3* 9.9* 11.6* 9.9*  HCT 33.8* 32.3* 37.8 32.1*  MCV 92.3 92.0 92.2 91.5  PLT 237 219 269 245   Basic Metabolic Panel: Recent Labs  Lab 02/19/23 0646 02/20/23 0553 02/21/23 0603 02/22/23 0533 02/23/23 0530  NA 135 134* 135 134* 135  K 4.0 4.2 3.7 3.9 3.8  CL 94* 94* 96* 95* 96*  CO2 31 30 31 30 31   GLUCOSE 76 69* 114* 158* 67*  BUN 35* 34* 32* 31* 29*  CREATININE 1.74* 1.99* 1.99* 1.96* 1.98*  CALCIUM 8.7* 9.2 8.4* 8.4* 8.4*  MG  --   --  2.0  --   --    Liver Function Tests: Recent Labs  Lab 02/17/23 0835 02/18/23 0756 02/19/23 0646  02/20/23 0553 02/21/23 0603  AST 10* 10* 10* 14* 10*  ALT 8 8 9 8 8   ALKPHOS 47 47 51 65 49  BILITOT 0.7 0.7 0.8 1.1 0.8  PROT 6.3* 6.3* 7.5 8.6* 6.5  ALBUMIN 2.3* 2.2* 2.9* 3.3* 2.3*   CBG: Recent Labs  Lab 02/22/23 1124 02/22/23 1624 02/22/23 2046 02/23/23 0739 02/23/23 1202  GLUCAP 107* 92 105* 102* 104*    Discharge time spent: {LESS THAN/GREATER THAN:26388} 30 minutes.  Signed: Jacquelin Hawking, MD [facility] 02/23/2023

## 2023-02-23 NOTE — Plan of Care (Signed)
  Problem: Education: Goal: Ability to describe self-care measures that may prevent or decrease complications (Diabetes Survival Skills Education) will improve Outcome: Progressing Goal: Individualized Educational Video(s) Outcome: Progressing   Problem: Coping: Goal: Ability to adjust to condition or change in health will improve Outcome: Progressing   

## 2023-02-23 NOTE — Progress Notes (Signed)
PT Cancellation Note  Patient Details Name: Karen Bennett MRN: [id number] DOB: 1975/09/12   Cancelled Treatment:    Reason Eval/Treat Not Completed: Patient declined, reporting she was going to d/c home today. Pt reports her PCA will assist in home setting and have rented a total lift.   Johnny Bridge, PT Acute Rehab  Jacqualyn Posey 02/23/2023, 1:55 PM

## 2023-02-23 NOTE — Inpatient Diabetes Management (Signed)
Inpatient Diabetes Program Recommendations  AACE/ADA: New Consensus Statement on Inpatient Glycemic Control   Target Ranges:  Prepandial:   less than 140 mg/dL      Peak postprandial:   less than 180 mg/dL (1-2 hours)      Critically ill patients:  140 - 180 mg/dL    Latest Reference Range & Units 02/23/23 07:39  Glucose-Capillary 70 - 99 mg/dL 161 (H)    Latest Reference Range & Units 02/23/23 05:30  Glucose 70 - 99 mg/dL 67 (L)    Latest Reference Range & Units 02/22/23 07:55 02/22/23 11:24 02/22/23 16:24 02/22/23 20:46  Glucose-Capillary 70 - 99 mg/dL 096 (H) 045 (H) 92 409 (H)   Review of Glycemic Control  Diabetes history: DM2 Outpatient Diabetes medications: Semglee 50 units at bedtime, Humalog 0-12 units TID with meals Current orders for Inpatient glycemic control: Semglee 20 units at bedtime, Novolog 0-15 units TID with meals, Novolog 0-5 units QHS  Inpatient Diabetes Program Recommendations:    Insulin: Lab glucose 67 mg/dl today at 8:11 am and finger stick 102 mg/dl at 9:14 am today. Please consider decreasing Semglee 17 units at bedtime.  Thanks, Orlando Penner, RN, MSN, CDCES Diabetes Coordinator Inpatient Diabetes Program [phone] (Team Pager from 8am to 5pm)

## 2023-02-23 NOTE — Discharge Instructions (Signed)
Karen Bennett,  You were in the hospital with trouble moving around. You were managed with Lasix IV with improvement of edema. Your insulin has been decreased because of hypoglycemia. If you go home and resume a diet that is different than your hospital diet, you may need to adjust your insulin. The recommendation is for you to maintain a low carb diet, however, as this will keep your blood sugar better controlled.

## 2023-02-26 ENCOUNTER — Other Ambulatory Visit (HOSPITAL_COMMUNITY): Payer: Self-pay

## 2023-02-27 ENCOUNTER — Other Ambulatory Visit (HOSPITAL_COMMUNITY): Payer: Self-pay

## 2023-03-05 ENCOUNTER — Emergency Department (HOSPITAL_COMMUNITY)
Admission: EM | Admit: 2023-03-05 | Discharge: 2023-03-05 | Disposition: A | Discharge status: Home / Self Care | Payer: 59 | Attending: Emergency Medicine | Admitting: Emergency Medicine

## 2023-03-05 DIAGNOSIS — M5136 Other intervertebral disc degeneration, lumbar region: Secondary | ICD-10-CM | POA: Diagnosis not present

## 2023-03-05 DIAGNOSIS — Z7901 Long term (current) use of anticoagulants: Secondary | ICD-10-CM | POA: Diagnosis not present

## 2023-03-05 DIAGNOSIS — M545 Low back pain, unspecified: Secondary | ICD-10-CM | POA: Diagnosis present

## 2023-03-05 DIAGNOSIS — E119 Type 2 diabetes mellitus without complications: Secondary | ICD-10-CM | POA: Diagnosis not present

## 2023-03-05 DIAGNOSIS — Z794 Long term (current) use of insulin: Secondary | ICD-10-CM | POA: Insufficient documentation

## 2023-03-05 LAB — CBG MONITORING, ED: Glucose-Capillary: 150 mg/dL — ABNORMAL HIGH (ref 70–99)

## 2023-03-05 MED ORDER — METHOCARBAMOL 500 MG PO TABS: 500.0000 mg | ORAL_TABLET | Freq: Two times a day (BID) | ORAL | 0 refills | Status: DC

## 2023-03-05 MED ORDER — DIAZEPAM 5 MG PO TABS
5.0000 mg | ORAL_TABLET | Freq: Once | ORAL | Status: AC
  Administered 2023-03-05: 5 mg via ORAL
  Filled 2023-03-05: qty 1

## 2023-03-05 MED ORDER — PROMETHAZINE HCL 25 MG PO TABS
25.0000 mg | ORAL_TABLET | Freq: Once | ORAL | Status: AC
  Administered 2023-03-05: 25 mg via ORAL
  Filled 2023-03-05: qty 1

## 2023-03-05 MED ORDER — NAPROXEN 500 MG PO TABS: 500.0000 mg | ORAL_TABLET | Freq: Once | ORAL | Status: DC

## 2023-03-05 MED ORDER — OXYCODONE HCL 10 MG PO TABS
10.0000 mg | ORAL_TABLET | Freq: Two times a day (BID) | ORAL | 0 refills | Status: DC | PRN
Start: 2023-03-05 — End: 2023-03-13

## 2023-03-05 MED ORDER — OXYCODONE-ACETAMINOPHEN 5-325 MG PO TABS
1.0000 | ORAL_TABLET | Freq: Once | ORAL | Status: AC
  Administered 2023-03-05: 1 via ORAL
  Filled 2023-03-05: qty 1

## 2023-03-05 MED ORDER — HYDROMORPHONE HCL 1 MG/ML IJ SOLN
1.0000 mg | Freq: Once | INTRAMUSCULAR | Status: AC
  Administered 2023-03-05: 1 mg via INTRAMUSCULAR
  Filled 2023-03-05: qty 1

## 2023-03-05 NOTE — ED Triage Notes (Signed)
BIB EMS recently discharged due to back pain. 2 days of N/V/D x 2-4 days. Pt has history of "fluid overload"  160/84-80-95% RA -18 CBG 144

## 2023-03-05 NOTE — Discharge Instructions (Addendum)
We saw you in the ER for back pain. Fortunately, our evaluation is not concerning for emergent pathology such as spinal cord compression or infection.   Often the pain is self limiting, you just need time and supportive medications such as ibuprofen every 6-8 hours for the next few days. Take the muscle relaxant as needed, and see your primary care doctor for further management if the symptoms continue to linger.   Please use the back exercises to strengthen the back muscles and be very careful with activities in future to prevent similar painful events.  Please return to the ER if your pain becomes excruciating or you start developing new numbness, weakness, urinary incontinence (peeing on self without warning), urinary retention (not able to pee despite bladder feeling full), inability to defecate, pins and needles sensation by your ano-genital area.

## 2023-03-05 NOTE — ED Provider Notes (Addendum)
[place] EMERGENCY DEPARTMENT AT [facility] Provider Note   CSN: [id number] Arrival date & time: 03/05/23  1914     History  Chief Complaint  Patient presents with   Back Pain   Nausea   Emesis   Diarrhea    Karen Bennett is a 47 y.o. female.  HPI    47 year old female comes in with chief complaint of back pain, nausea/  Her primary complaint is back pain.  Patient states that while in the hospital recently, she was diagnosed with severe spine disease.  She is to follow-up with her PCP later this month for it.  However since her discharge, her pain has worsened.  She is taking Tylenol without significant relief.  Pain is located in the lower back.  Pt has no associated numbness, weakness, urinary incontinence, urinary retention, bowel incontinence, pins and needle sensation in the perineal area.  Patient denies any burning with urination.   Home Medications Prior to Admission medications   Medication Sig Start Date End Date Taking? Authorizing Provider  methocarbamol (ROBAXIN) 500 MG tablet Take 1 tablet (500 mg total) by mouth 2 (two) times daily. 03/05/23  Yes Derwood Kaplan, MD  oxyCODONE 10 MG TABS Take 1 tablet (10 mg total) by mouth every 12 (twelve) hours as needed for severe pain. 03/05/23  Yes Derwood Kaplan, MD  acetaminophen (TYLENOL) 500 MG tablet Take 500 mg by mouth every 6 (six) hours as needed for moderate pain.    [provider]  atorvastatin (LIPITOR) 40 MG tablet Take 1 tablet (40 mg total) by mouth daily at 6 PM. 02/23/23 05/24/23  Narda Bonds, MD  BIOTIN PO Take 2 capsules by mouth daily. Unsure of dose    [provider]  buPROPion ER (WELLBUTRIN SR) 100 MG 12 hr tablet Take 1 tablet (100 mg total) by mouth every morning. 02/23/23 03/25/23  Narda Bonds, MD  diazepam (VALIUM) 5 MG tablet Take 5 mg by mouth every 8 (eight) hours as needed for anxiety. 03/30/21   [provider]  furosemide (LASIX) 20 MG tablet Take  1 tablet (20 mg total) by mouth every other day. 02/23/23 03/25/23  Narda Bonds, MD  glucose blood test strip Use as instructed 06/26/13   Doris Cheadle, MD  glucose monitoring kit (FREESTYLE) monitoring kit 1 each by Does not apply route as needed for other. 06/26/13   Doris Cheadle, MD  hydrOXYzine (VISTARIL) 50 MG capsule Take 50 mg by mouth every 8 (eight) hours as needed for itching.    [provider]  insulin glargine (SEMGLEE, YFGN,) 100 UNIT/ML Solostar Pen Inject 10 Units into the skin at bedtime. 02/23/23   Narda Bonds, MD  levothyroxine (SYNTHROID) 50 MCG tablet Take 1 tablet (50 mcg total) by mouth daily at 6 (six) AM. 02/23/23   Narda Bonds, MD  metoCLOPramide (REGLAN) 5 MG tablet Take 1 tablet (5 mg total) by mouth every 6 (six) hours as needed for nausea. 02/09/21   Hollice Espy, MD  promethazine (PHENERGAN) 25 MG tablet Take 25 mg by mouth 2 (two) times daily as needed for nausea/vomiting or nausea. 01/12/21   [provider]  traZODone (DESYREL) 150 MG tablet Take 1 tablet (150 mg total) by mouth at bedtime. 03/02/22   Lewie Chamber, MD  XARELTO 20 MG TABS tablet Take 1 tablet (20 mg total) by mouth in the morning. Start after finishing Xarelto started pack 02/23/23 05/24/23  Narda Bonds, MD  Allergies    Clindamycin, Zofran, Doxycycline, and Morphine and codeine    Review of Systems   Review of Systems  All other systems reviewed and are negative.   Physical Exam Updated Vital Signs BP (!) 176/77 (BP Location: Left Arm)   Pulse 63   Temp 97.9 F (36.6 C) (Oral)   Resp 18   Ht 5\' 6"  (1.676 m)   Wt (!) 186.4 kg   LMP 04/13/2013   SpO2 96%   BMI 66.33 kg/m  Physical Exam Vitals and nursing note reviewed.  Constitutional:      Appearance: She is well-developed.  HENT:     Head: Atraumatic.  Cardiovascular:     Rate and Rhythm: Normal rate.  Pulmonary:     Effort: Pulmonary effort is normal.  Musculoskeletal:     Cervical back:  Normal range of motion and neck supple.     Comments: Patient has reproducible tenderness with palpation of the lower lumbar spine  Skin:    General: Skin is warm and dry.  Neurological:     Mental Status: She is alert and oriented to person, place, and time.     ED Results / Procedures / Treatments   Labs (all labs ordered are listed, but only abnormal results are displayed) Labs Reviewed  CBG MONITORING, ED - Abnormal; Notable for the following components:      Result Value   Glucose-Capillary 150 (*)    All other components within normal limits    EKG None  Radiology No results found.  Procedures Procedures    Medications Ordered in ED Medications  HYDROmorphone (DILAUDID) injection 1 mg (1 mg Intramuscular Given 03/05/23 1124)  diazepam (VALIUM) tablet 5 mg (5 mg Oral Given 03/05/23 1122)  promethazine (PHENERGAN) tablet 25 mg (25 mg Oral Given 03/05/23 1122)  oxyCODONE-acetaminophen (PERCOCET/ROXICET) 5-325 MG per tablet 1 tablet (1 tablet Oral Given 03/05/23 1310)    ED Course/ Medical Decision Making/ A&P                                 Medical Decision Making Risk Prescription drug management.   47 year old female comes in with chief complaint of back pain, nausea.  She has history of diabetes, pulmonary embolism, morbid obesity with BMI over 60 and a recent admission to the hospital for volume overload.  During that time patient had an MRI of the lumbar spine that revealed some degenerative disc disease and degenerative spine disease.  Patient has no red flags suggesting cauda equina or cord compression.  It appears that the pain is strictly back related.  She has no UTI-like symptoms.  Differential diagnosis considered for this patient includes - DJD of the back - Spondylitises/ spondylosis - Sciatica -Compression fracture - Musculoskeletal pain  Plan is to get CBC to ensure her sugar is fine. It appears that patient has chronic nausea. We will  give her IM hydromorphone along with p.o. nausea medicine and p.o. muscle relaxant.  I have reviewed patient's recent discharge summary and also her MRI results.  2:06 PM Patient reassessed.  States that the pain medicine did help her. Unfortunately, she has BMI over 60 which will be limiting her ability to exercise.  I discussed with her that we will try to manage her pain, but long-term she should not rely on narcotic medicine for symptom management.  She has PCP follow-up at the end of the month.  We will advise  that she continues to take Tylenol every 6 hours and can take narcotic medicine if needed for excruciating pain only.  Muscle relaxant will be added.  Final Clinical Impression(s) / ED Diagnoses Final diagnoses:  Degenerative disc disease, lumbar    Rx / DC Orders ED Discharge Orders          Ordered    methocarbamol (ROBAXIN) 500 MG tablet  2 times daily        03/05/23 1301    oxyCODONE 10 MG TABS  Every 12 hours PRN        03/05/23 1301              Derwood Kaplan, MD 03/05/23 1259    Derwood Kaplan, MD 03/05/23 1406

## 2023-03-05 NOTE — ED Notes (Signed)
PTAR called at 12:29 pm by Clydie Braun

## 2023-03-13 ENCOUNTER — Other Ambulatory Visit: Payer: Self-pay

## 2023-03-13 ENCOUNTER — Emergency Department (HOSPITAL_COMMUNITY)
Admission: EM | Admit: 2023-03-13 | Discharge: 2023-03-13 | Disposition: A | Discharge status: Home / Self Care | Payer: 59 | Attending: Emergency Medicine | Admitting: Emergency Medicine

## 2023-03-13 DIAGNOSIS — Z794 Long term (current) use of insulin: Secondary | ICD-10-CM | POA: Insufficient documentation

## 2023-03-13 DIAGNOSIS — M546 Pain in thoracic spine: Secondary | ICD-10-CM | POA: Diagnosis not present

## 2023-03-13 DIAGNOSIS — Z7901 Long term (current) use of anticoagulants: Secondary | ICD-10-CM | POA: Insufficient documentation

## 2023-03-13 DIAGNOSIS — R110 Nausea: Secondary | ICD-10-CM | POA: Diagnosis not present

## 2023-03-13 MED ORDER — PROMETHAZINE HCL 25 MG/ML IJ SOLN
12.5000 mg | Freq: Once | INTRAMUSCULAR | Status: AC
  Administered 2023-03-13: 12.5 mg via INTRAMUSCULAR
  Filled 2023-03-13: qty 1

## 2023-03-13 MED ORDER — OXYCODONE HCL 10 MG PO TABS: 10.0000 mg | ORAL_TABLET | Freq: Two times a day (BID) | ORAL | 0 refills | Status: DC | PRN

## 2023-03-13 MED ORDER — METHOCARBAMOL 500 MG PO TABS: 500.0000 mg | ORAL_TABLET | Freq: Two times a day (BID) | ORAL | 0 refills | Status: DC

## 2023-03-13 MED ORDER — OXYCODONE HCL 5 MG PO TABS
10.0000 mg | ORAL_TABLET | Freq: Once | ORAL | Status: AC
  Administered 2023-03-13: 10 mg via ORAL
  Filled 2023-03-13: qty 2

## 2023-03-13 MED ORDER — PROMETHAZINE HCL 25 MG PO TABS: 25.0000 mg | ORAL_TABLET | Freq: Two times a day (BID) | ORAL | 0 refills | Status: DC | PRN

## 2023-03-13 NOTE — ED Provider Notes (Signed)
[place] EMERGENCY DEPARTMENT AT [facility] Provider Note   CSN: [id number] Arrival date & time: 03/13/23  7829     History  Chief Complaint  Patient presents with   Back Pain   Nausea   Hypertension    Karen Bennett is a 47 y.o. female.  HPI Presents with back pain.  She notes a history of spinal stenosis, was diagnosed here 2 weeks ago, notes that she has been taking her medication, but ran out and has not yet followed up with primary care, and is hesitant to follow-up with neurosurgery due to phobia about possible surgery. She acknowledges multiple medical problems, has no other changes in these regards, has no fever, no weakness.     Home Medications Prior to Admission medications   Medication Sig Start Date End Date Taking? Authorizing Provider  acetaminophen (TYLENOL) 500 MG tablet Take 500 mg by mouth every 6 (six) hours as needed for moderate pain.    [provider]  atorvastatin (LIPITOR) 40 MG tablet Take 1 tablet (40 mg total) by mouth daily at 6 PM. 02/23/23 05/24/23  Narda Bonds, MD  BIOTIN PO Take 2 capsules by mouth daily. Unsure of dose    [provider]  buPROPion ER (WELLBUTRIN SR) 100 MG 12 hr tablet Take 1 tablet (100 mg total) by mouth every morning. 02/23/23 03/25/23  Narda Bonds, MD  diazepam (VALIUM) 5 MG tablet Take 5 mg by mouth every 8 (eight) hours as needed for anxiety. 03/30/21   [provider]  furosemide (LASIX) 20 MG tablet Take 1 tablet (20 mg total) by mouth every other day. 02/23/23 03/25/23  Narda Bonds, MD  glucose blood test strip Use as instructed 06/26/13   Doris Cheadle, MD  glucose monitoring kit (FREESTYLE) monitoring kit 1 each by Does not apply route as needed for other. 06/26/13   Doris Cheadle, MD  hydrOXYzine (VISTARIL) 50 MG capsule Take 50 mg by mouth every 8 (eight) hours as needed for itching.    [provider]  insulin glargine (SEMGLEE, YFGN,) 100 UNIT/ML Solostar Pen  Inject 10 Units into the skin at bedtime. 02/23/23   Narda Bonds, MD  levothyroxine (SYNTHROID) 50 MCG tablet Take 1 tablet (50 mcg total) by mouth daily at 6 (six) AM. 02/23/23   Narda Bonds, MD  methocarbamol (ROBAXIN) 500 MG tablet Take 1 tablet (500 mg total) by mouth 2 (two) times daily. 03/13/23   Gerhard Munch, MD  metoCLOPramide (REGLAN) 5 MG tablet Take 1 tablet (5 mg total) by mouth every 6 (six) hours as needed for nausea. 02/09/21   Hollice Espy, MD  Oxycodone HCl 10 MG TABS Take 1 tablet (10 mg total) by mouth every 12 (twelve) hours as needed. 03/13/23   Gerhard Munch, MD  promethazine (PHENERGAN) 25 MG tablet Take 1 tablet (25 mg total) by mouth 2 (two) times daily as needed. 03/13/23   Gerhard Munch, MD  traZODone (DESYREL) 150 MG tablet Take 1 tablet (150 mg total) by mouth at bedtime. 03/02/22   Lewie Chamber, MD  XARELTO 20 MG TABS tablet Take 1 tablet (20 mg total) by mouth in the morning. Start after finishing Xarelto started pack 02/23/23 05/24/23  Narda Bonds, MD      Allergies    Clindamycin, Zofran, Doxycycline, and Morphine and codeine    Review of Systems   Review of Systems  All other systems reviewed and are negative.   Physical Exam  Updated Vital Signs BP (!) 175/85   Pulse 70   Temp 98 F (36.7 C) (Oral)   Resp 18   Ht 5\' 6"  (1.676 m)   Wt (!) 181.4 kg   LMP 04/13/2013   SpO2 97%   BMI 64.56 kg/m  Physical Exam Vitals and nursing note reviewed.  Constitutional:      General: She is not in acute distress.    Appearance: She is well-developed. She is obese. She is not ill-appearing or diaphoretic.  HENT:     Head: Normocephalic and atraumatic.  Eyes:     Conjunctiva/sclera: Conjunctivae normal.  Cardiovascular:     Rate and Rhythm: Normal rate and regular rhythm.  Pulmonary:     Effort: Pulmonary effort is normal. No respiratory distress.     Breath sounds: Normal breath sounds. No stridor.  Abdominal:     General: There is  no distension.  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  Patient moves her extremities spontaneously and to command.  Skin:    General: Skin is warm and dry.  Neurological:     Mental Status: She is alert and oriented to person, place, and time.     Cranial Nerves: No cranial nerve deficit.     Comments: Patient awake, alert, face is symmetric, speech is clear, patient follows commands appropriately, strength unremarkable.  Psychiatric:        Mood and Affect: Mood normal.     ED Results / Procedures / Treatments   Labs (all labs ordered are listed, but only abnormal results are displayed) Labs Reviewed - No data to display  EKG None  Radiology No results found.  Procedures Procedures    Medications Ordered in ED Medications  promethazine (PHENERGAN) injection 12.5 mg (12.5 mg Intramuscular Given 03/13/23 0921)    ED Course/ Medical Decision Making/ A&P                                 Medical Decision Making Obese adult female with spinal stenosis presents with back pain in the context of recent lack of access to her medications. Patient's denial of other complaints, reassuring physical exam, vital signs noted. Absent evidence for new phenomena, bacteremia, sepsis though she does have some nausea, vomiting, she notes this is typical for her pain.  Patient received Phenergan IM, readdition of medications as allowed, will follow-up with primary care and neurosurgery.  Amount and/or Complexity of Data Reviewed External Data Reviewed: notes.  Risk Prescription drug management. Diagnosis or treatment significantly limited by social determinants of health.   Pulse ox 100% room air normal Cardiac 70 sinus normal  Final Clinical Impression(s) / ED Diagnoses Final diagnoses:  Acute midline thoracic back pain    Rx / DC Orders ED Discharge Orders          Ordered    Oxycodone  HCl 10 MG TABS  Every 12 hours PRN        03/13/23 0929    methocarbamol (ROBAXIN) 500 MG tablet  2 times daily        03/13/23 0929    promethazine (PHENERGAN) 25 MG tablet  2 times daily PRN        03/13/23 0929              Gerhard Munch, MD 03/13/23 [phone]

## 2023-03-13 NOTE — Discharge Instructions (Signed)
Please be sure to expedite follow-up with both your primary care physician and our neurosurgery colleagues.  Return here for concerning changes in your condition.

## 2023-03-13 NOTE — ED Notes (Signed)
PTAR called for transport home. 

## 2023-03-13 NOTE — ED Triage Notes (Signed)
Pt BIBA from home. C/o chronic back pai, nausea, and was hypertensive on EMS arrival with BP at 200/99  CBG: 304  AOx4

## 2023-03-21 ENCOUNTER — Emergency Department (HOSPITAL_COMMUNITY): Payer: 59

## 2023-03-21 ENCOUNTER — Inpatient Hospital Stay (HOSPITAL_COMMUNITY)
Admission: EM | Admit: 2023-03-21 | Discharge: 2023-03-23 | DRG: 065 | Disposition: A | Discharge status: Home Health | Payer: 59 | Attending: Family Medicine | Admitting: Family Medicine

## 2023-03-21 ENCOUNTER — Observation Stay (HOSPITAL_COMMUNITY): Payer: 59

## 2023-03-21 DIAGNOSIS — R4701 Aphasia: Secondary | ICD-10-CM | POA: Diagnosis not present

## 2023-03-21 DIAGNOSIS — I081 Rheumatic disorders of both mitral and tricuspid valves: Secondary | ICD-10-CM | POA: Diagnosis present

## 2023-03-21 DIAGNOSIS — N1831 Chronic kidney disease, stage 3a: Secondary | ICD-10-CM | POA: Diagnosis present

## 2023-03-21 DIAGNOSIS — I63541 Cerebral infarction due to unspecified occlusion or stenosis of right cerebellar artery: Principal | ICD-10-CM | POA: Diagnosis present

## 2023-03-21 DIAGNOSIS — I5032 Chronic diastolic (congestive) heart failure: Secondary | ICD-10-CM | POA: Diagnosis present

## 2023-03-21 DIAGNOSIS — E1159 Type 2 diabetes mellitus with other circulatory complications: Secondary | ICD-10-CM | POA: Diagnosis present

## 2023-03-21 DIAGNOSIS — E039 Hypothyroidism, unspecified: Secondary | ICD-10-CM | POA: Diagnosis present

## 2023-03-21 DIAGNOSIS — Z79899 Other long term (current) drug therapy: Secondary | ICD-10-CM

## 2023-03-21 DIAGNOSIS — F419 Anxiety disorder, unspecified: Secondary | ICD-10-CM | POA: Diagnosis present

## 2023-03-21 DIAGNOSIS — E1151 Type 2 diabetes mellitus with diabetic peripheral angiopathy without gangrene: Secondary | ICD-10-CM | POA: Diagnosis present

## 2023-03-21 DIAGNOSIS — Z885 Allergy status to narcotic agent status: Secondary | ICD-10-CM

## 2023-03-21 DIAGNOSIS — E785 Hyperlipidemia, unspecified: Secondary | ICD-10-CM | POA: Diagnosis present

## 2023-03-21 DIAGNOSIS — E1169 Type 2 diabetes mellitus with other specified complication: Secondary | ICD-10-CM | POA: Diagnosis present

## 2023-03-21 DIAGNOSIS — Z833 Family history of diabetes mellitus: Secondary | ICD-10-CM

## 2023-03-21 DIAGNOSIS — I152 Hypertension secondary to endocrine disorders: Secondary | ICD-10-CM | POA: Diagnosis present

## 2023-03-21 DIAGNOSIS — E119 Type 2 diabetes mellitus without complications: Secondary | ICD-10-CM

## 2023-03-21 DIAGNOSIS — Z881 Allergy status to other antibiotic agents status: Secondary | ICD-10-CM

## 2023-03-21 DIAGNOSIS — I272 Pulmonary hypertension, unspecified: Secondary | ICD-10-CM | POA: Diagnosis present

## 2023-03-21 DIAGNOSIS — M549 Dorsalgia, unspecified: Secondary | ICD-10-CM | POA: Diagnosis present

## 2023-03-21 DIAGNOSIS — E1122 Type 2 diabetes mellitus with diabetic chronic kidney disease: Secondary | ICD-10-CM | POA: Diagnosis present

## 2023-03-21 DIAGNOSIS — E1165 Type 2 diabetes mellitus with hyperglycemia: Secondary | ICD-10-CM | POA: Diagnosis present

## 2023-03-21 DIAGNOSIS — Z79890 Hormone replacement therapy (postmenopausal): Secondary | ICD-10-CM

## 2023-03-21 DIAGNOSIS — Z86711 Personal history of pulmonary embolism: Secondary | ICD-10-CM

## 2023-03-21 DIAGNOSIS — Q2112 Patent foramen ovale: Secondary | ICD-10-CM

## 2023-03-21 DIAGNOSIS — E871 Hypo-osmolality and hyponatremia: Secondary | ICD-10-CM | POA: Diagnosis present

## 2023-03-21 DIAGNOSIS — Z794 Long term (current) use of insulin: Secondary | ICD-10-CM

## 2023-03-21 DIAGNOSIS — F32A Depression, unspecified: Secondary | ICD-10-CM | POA: Diagnosis present

## 2023-03-21 DIAGNOSIS — Z888 Allergy status to other drugs, medicaments and biological substances status: Secondary | ICD-10-CM

## 2023-03-21 DIAGNOSIS — I639 Cerebral infarction, unspecified: Secondary | ICD-10-CM

## 2023-03-21 DIAGNOSIS — F4000 Agoraphobia, unspecified: Secondary | ICD-10-CM | POA: Diagnosis present

## 2023-03-21 DIAGNOSIS — I110 Hypertensive heart disease with heart failure: Secondary | ICD-10-CM | POA: Diagnosis present

## 2023-03-21 DIAGNOSIS — Z823 Family history of stroke: Secondary | ICD-10-CM

## 2023-03-21 DIAGNOSIS — Z89511 Acquired absence of right leg below knee: Secondary | ICD-10-CM

## 2023-03-21 DIAGNOSIS — F1090 Alcohol use, unspecified, uncomplicated: Secondary | ICD-10-CM | POA: Diagnosis present

## 2023-03-21 DIAGNOSIS — R9431 Abnormal electrocardiogram [ECG] [EKG]: Secondary | ICD-10-CM | POA: Diagnosis present

## 2023-03-21 DIAGNOSIS — Z6841 Body Mass Index (BMI) 40.0 and over, adult: Secondary | ICD-10-CM

## 2023-03-21 DIAGNOSIS — Z86718 Personal history of other venous thrombosis and embolism: Secondary | ICD-10-CM

## 2023-03-21 DIAGNOSIS — Z7901 Long term (current) use of anticoagulants: Secondary | ICD-10-CM

## 2023-03-21 DIAGNOSIS — R29706 NIHSS score 6: Secondary | ICD-10-CM | POA: Diagnosis present

## 2023-03-21 DIAGNOSIS — E876 Hypokalemia: Secondary | ICD-10-CM | POA: Diagnosis present

## 2023-03-21 DIAGNOSIS — G8929 Other chronic pain: Secondary | ICD-10-CM | POA: Diagnosis present

## 2023-03-21 DIAGNOSIS — Z8249 Family history of ischemic heart disease and other diseases of the circulatory system: Secondary | ICD-10-CM

## 2023-03-21 LAB — OSMOLALITY: Osmolality: 295 mOsm/kg (ref 275–295)

## 2023-03-21 LAB — COMPREHENSIVE METABOLIC PANEL
ALT: 11 U/L (ref 0–44)
AST: 11 U/L — ABNORMAL LOW (ref 15–41)
Albumin: 1.9 g/dL — ABNORMAL LOW (ref 3.5–5.0)
Alkaline Phosphatase: 65 U/L (ref 38–126)
Anion gap: 13 (ref 5–15)
BUN: 13 mg/dL (ref 6–20)
CO2: 17 mmol/L — ABNORMAL LOW (ref 22–32)
Calcium: 8.6 mg/dL — ABNORMAL LOW (ref 8.9–10.3)
Chloride: 100 mmol/L (ref 98–111)
Creatinine, Ser: 1.33 mg/dL — ABNORMAL HIGH (ref 0.44–1.00)
GFR, Estimated: 50 mL/min — ABNORMAL LOW (ref >60)
Glucose, Bld: 271 mg/dL — ABNORMAL HIGH (ref 70–99)
Potassium: 3.4 mmol/L — ABNORMAL LOW (ref 3.5–5.1)
Sodium: 130 mmol/L — ABNORMAL LOW (ref 135–145)
Total Bilirubin: 1 mg/dL (ref 0.3–1.2)
Total Protein: 6.4 g/dL — ABNORMAL LOW (ref 6.5–8.1)

## 2023-03-21 LAB — PROTIME-INR
INR: 1.6 — ABNORMAL HIGH (ref 0.8–1.2)
Prothrombin Time: 19.3 seconds — ABNORMAL HIGH (ref 11.4–15.2)

## 2023-03-21 LAB — CBC
HCT: 42.9 % (ref 36.0–46.0)
Hemoglobin: 14.1 g/dL (ref 12.0–15.0)
MCH: 27.4 pg (ref 26.0–34.0)
MCHC: 32.9 g/dL (ref 30.0–36.0)
MCV: 83.3 fL (ref 80.0–100.0)
Platelets: 277 10*3/uL (ref 150–400)
RBC: 5.15 MIL/uL — ABNORMAL HIGH (ref 3.87–5.11)
RDW: 13.2 % (ref 11.5–15.5)
WBC: 7.2 10*3/uL (ref 4.0–10.5)
nRBC: 0 % (ref 0.0–0.2)

## 2023-03-21 LAB — I-STAT CHEM 8, ED
BUN: 15 mg/dL (ref 6–20)
Calcium, Ion: 1.09 mmol/L — ABNORMAL LOW (ref 1.15–1.40)
Chloride: 103 mmol/L (ref 98–111)
Creatinine, Ser: 1.3 mg/dL — ABNORMAL HIGH (ref 0.44–1.00)
Glucose, Bld: 270 mg/dL — ABNORMAL HIGH (ref 70–99)
HCT: 42 % (ref 36.0–46.0)
Hemoglobin: 14.3 g/dL (ref 12.0–15.0)
Potassium: 3.5 mmol/L (ref 3.5–5.1)
Sodium: 133 mmol/L — ABNORMAL LOW (ref 135–145)
TCO2: 19 mmol/L — ABNORMAL LOW (ref 22–32)

## 2023-03-21 LAB — CBG MONITORING, ED
Glucose-Capillary: 273 mg/dL — ABNORMAL HIGH (ref 70–99)
Glucose-Capillary: 290 mg/dL — ABNORMAL HIGH (ref 70–99)

## 2023-03-21 LAB — DIFFERENTIAL
Abs Immature Granulocytes: 0.03 10*3/uL (ref 0.00–0.07)
Basophils Absolute: 0 10*3/uL (ref 0.0–0.1)
Basophils Relative: 1 %
Eosinophils Absolute: 0.2 10*3/uL (ref 0.0–0.5)
Eosinophils Relative: 3 %
Immature Granulocytes: 0 %
Lymphocytes Relative: 28 %
Lymphs Abs: 2 10*3/uL (ref 0.7–4.0)
Monocytes Absolute: 0.5 10*3/uL (ref 0.1–1.0)
Monocytes Relative: 7 %
Neutro Abs: 4.4 10*3/uL (ref 1.7–7.7)
Neutrophils Relative %: 61 %

## 2023-03-21 LAB — MAGNESIUM: Magnesium: 1.5 mg/dL — ABNORMAL LOW (ref 1.7–2.4)

## 2023-03-21 LAB — ETHANOL: Alcohol, Ethyl (B): <10 mg/dL (ref <10)

## 2023-03-21 LAB — LIPASE, BLOOD: Lipase: 23 U/L (ref 11–51)

## 2023-03-21 LAB — BETA-HYDROXYBUTYRIC ACID: Beta-Hydroxybutyric Acid: 0.33 mmol/L — ABNORMAL HIGH (ref 0.05–0.27)

## 2023-03-21 LAB — APTT: aPTT: 39 seconds — ABNORMAL HIGH (ref 24–36)

## 2023-03-21 LAB — HIV ANTIBODY (ROUTINE TESTING W REFLEX): HIV Screen 4th Generation wRfx: NONREACTIVE

## 2023-03-21 MED ORDER — LORAZEPAM 2 MG/ML IJ SOLN
1.0000 mg | Freq: Once | INTRAMUSCULAR | Status: AC
  Administered 2023-03-21: 1 mg via INTRAVENOUS
  Filled 2023-03-21: qty 1

## 2023-03-21 MED ORDER — FENTANYL CITRATE PF 50 MCG/ML IJ SOSY
50.0000 ug | PREFILLED_SYRINGE | Freq: Once | INTRAMUSCULAR | Status: AC
  Administered 2023-03-21: 50 ug via INTRAVENOUS
  Filled 2023-03-21: qty 1

## 2023-03-21 MED ORDER — IOHEXOL 350 MG/ML SOLN
150.0000 mL | Freq: Once | INTRAVENOUS | Status: AC | PRN
  Administered 2023-03-21: 150 mL via INTRAVENOUS

## 2023-03-21 MED ORDER — ACETAMINOPHEN 160 MG/5ML PO SOLN: 650.0000 mg | Freq: Four times a day (QID) | ORAL | Status: DC | PRN

## 2023-03-21 MED ORDER — ACETAMINOPHEN 325 MG PO TABS
650.0000 mg | ORAL_TABLET | Freq: Four times a day (QID) | ORAL | Status: DC | PRN
  Administered 2023-03-22 – 2023-03-23 (×2): 650 mg via ORAL
  Filled 2023-03-21 (×2): qty 2

## 2023-03-21 MED ORDER — ACETAMINOPHEN 650 MG RE SUPP: 650.0000 mg | Freq: Four times a day (QID) | RECTAL | Status: DC | PRN

## 2023-03-21 MED ORDER — INSULIN ASPART 100 UNIT/ML IJ SOLN
0.0000 [IU] | INTRAMUSCULAR | Status: DC
  Administered 2023-03-21: 5 [IU] via SUBCUTANEOUS
  Administered 2023-03-22 (×2): 1 [IU] via SUBCUTANEOUS
  Administered 2023-03-22: 3 [IU] via SUBCUTANEOUS
  Administered 2023-03-22: 2 [IU] via SUBCUTANEOUS

## 2023-03-21 MED ORDER — STROKE: EARLY STAGES OF RECOVERY BOOK
Freq: Once | Status: AC
  Filled 2023-03-21: qty 1

## 2023-03-21 MED ORDER — POTASSIUM CHLORIDE 10 MEQ/100ML IV SOLN
10.0000 meq | INTRAVENOUS | Status: AC
  Administered 2023-03-21 (×2): 10 meq via INTRAVENOUS
  Filled 2023-03-21 (×2): qty 100

## 2023-03-21 MED ORDER — IOHEXOL 350 MG/ML SOLN: 150.0000 mL | Freq: Once | INTRAVENOUS | Status: DC | PRN

## 2023-03-21 NOTE — ED Notes (Signed)
Found pt to have blood all over her arms and hands, noted IV has been removed and on bed. Cleansed and changed pt at this time. Pt also threw up another time, green vomit. Restarted IV, hospitalist is at bedside.

## 2023-03-21 NOTE — ED Notes (Signed)
Patient noted to have threw up, noted vomit on her gown. Req nausea medicine from provider. Changed pt gown. Made comfortable.

## 2023-03-21 NOTE — ED Provider Notes (Signed)
[place] EMERGENCY DEPARTMENT AT [facility] Provider Note   CSN: [id number] Arrival date & time: 03/21/23  1739  An emergency department physician performed an initial assessment on this suspected stroke patient at 1740.  History  Chief Complaint  Patient presents with   Aphasia    Karen Bennett is a 47 y.o. female.  HPI Patient presents as a code stroke.  History is obtained by those individuals bringing her here, EMS. I saw the patient 8 days ago at another facility, patient presented for acute on chronic back pain at that point, having run out of her pain medication. Now, she presents with aphasia, onset 1 hour prior to ED arrival.  Level 5 caveat secondary to acuity of condition.    Home Medications Prior to Admission medications   Medication Sig Start Date End Date Taking? Authorizing Provider  acetaminophen (TYLENOL) 500 MG tablet Take 500 mg by mouth every 6 (six) hours as needed for moderate pain.    [provider]  atorvastatin (LIPITOR) 40 MG tablet Take 1 tablet (40 mg total) by mouth daily at 6 PM. 02/23/23 05/24/23  Narda Bonds, MD  BIOTIN PO Take 2 capsules by mouth daily. Unsure of dose    [provider]  buPROPion ER (WELLBUTRIN SR) 100 MG 12 hr tablet Take 1 tablet (100 mg total) by mouth every morning. 02/23/23 03/25/23  Narda Bonds, MD  diazepam (VALIUM) 5 MG tablet Take 5 mg by mouth every 8 (eight) hours as needed for anxiety. 03/30/21   [provider]  furosemide (LASIX) 20 MG tablet Take 1 tablet (20 mg total) by mouth every other day. 02/23/23 03/25/23  Narda Bonds, MD  glucose blood test strip Use as instructed 06/26/13   Doris Cheadle, MD  glucose monitoring kit (FREESTYLE) monitoring kit 1 each by Does not apply route as needed for other. 06/26/13   Doris Cheadle, MD  hydrOXYzine (VISTARIL) 50 MG capsule Take 50 mg by mouth every 8 (eight) hours as needed for itching.    [provider]  insulin  glargine (SEMGLEE, YFGN,) 100 UNIT/ML Solostar Pen Inject 10 Units into the skin at bedtime. 02/23/23   Narda Bonds, MD  levothyroxine (SYNTHROID) 50 MCG tablet Take 1 tablet (50 mcg total) by mouth daily at 6 (six) AM. 02/23/23   Narda Bonds, MD  methocarbamol (ROBAXIN) 500 MG tablet Take 1 tablet (500 mg total) by mouth 2 (two) times daily. 03/13/23   Gerhard Munch, MD  metoCLOPramide (REGLAN) 5 MG tablet Take 1 tablet (5 mg total) by mouth every 6 (six) hours as needed for nausea. 02/09/21   Hollice Espy, MD  Oxycodone HCl 10 MG TABS Take 1 tablet (10 mg total) by mouth every 12 (twelve) hours as needed. 03/13/23   Gerhard Munch, MD  promethazine (PHENERGAN) 25 MG tablet Take 1 tablet (25 mg total) by mouth 2 (two) times daily as needed. 03/13/23   Gerhard Munch, MD  traZODone (DESYREL) 150 MG tablet Take 1 tablet (150 mg total) by mouth at bedtime. 03/02/22   Lewie Chamber, MD  XARELTO 20 MG TABS tablet Take 1 tablet (20 mg total) by mouth in the morning. Start after finishing Xarelto started pack 02/23/23 05/24/23  Narda Bonds, MD      Allergies    Clindamycin, Zofran, Doxycycline, and Morphine and codeine    Review of Systems   Review of Systems  Unable to perform ROS: Acuity of condition  Physical Exam Updated Vital Signs BP (!) 214/112   Pulse 91   Resp (!) 22   Ht 5\' 6"  (1.676 m)   Wt (!) 164 kg   LMP 04/13/2013   SpO2 99%   BMI 58.36 kg/m  Physical Exam Vitals and nursing note reviewed.  Constitutional:      General: She is not in acute distress.    Appearance: She is well-developed. She is obese. She is not ill-appearing or diaphoretic.  HENT:     Head: Normocephalic and atraumatic.  Eyes:     Conjunctiva/sclera: Conjunctivae normal.  Cardiovascular:     Rate and Rhythm: Normal rate and regular rhythm.  Pulmonary:     Effort: Pulmonary effort is normal. No respiratory distress.     Breath sounds: Normal breath sounds. No stridor.  Abdominal:      General: There is no distension.  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  Patient moves her extremities spontaneously and to command.  Skin:    General: Skin is warm and dry.  Neurological:     Mental Status: She is alert and oriented to person, place, and time.     Cranial Nerves: No cranial nerve deficit.     Comments: Patient awake, alert, face is symmetric, speech is inconsistent: Occasional lack of response, some repetitiveness, some difficulty with word finding.  Psychiatric:        Mood and Affect: Mood normal.     ED Results / Procedures / Treatments   Labs (all labs ordered are listed, but only abnormal results are displayed) Labs Reviewed  PROTIME-INR - Abnormal; Notable for the following components:      Result Value   Prothrombin Time 19.3 (*)    INR 1.6 (*)    All other components within normal limits  APTT - Abnormal; Notable for the following components:   aPTT 39 (*)    All other components within normal limits  CBC - Abnormal; Notable for the following components:   RBC 5.15 (*)    All other components within normal limits  COMPREHENSIVE METABOLIC PANEL - Abnormal; Notable for the following components:   Sodium 130 (*)    Potassium 3.4 (*)    CO2 17 (*)    Glucose, Bld 271 (*)    Creatinine, Ser 1.33 (*)    Calcium 8.6 (*)    Total Protein 6.4 (*)    Albumin 1.9 (*)    AST 11 (*)    GFR, Estimated 50 (*)    All other components within normal limits  BETA-HYDROXYBUTYRIC ACID - Abnormal; Notable for the following components:   Beta-Hydroxybutyric Acid 0.33 (*)    All other components within normal limits  MAGNESIUM - Abnormal; Notable for the following components:   Magnesium 1.5 (*)    All other components within normal limits  I-STAT CHEM 8, ED - Abnormal; Notable for the following components:   Sodium 133 (*)    Creatinine, Ser 1.30 (*)    Glucose, Bld 270 (*)     Calcium, Ion 1.09 (*)    TCO2 19 (*)    All other components within normal limits  CBG MONITORING, ED - Abnormal; Notable for the following components:   Glucose-Capillary 273 (*)    All other components within normal limits  CBG MONITORING, ED - Abnormal; Notable for the following components:   Glucose-Capillary 290 (*)    All other components within normal  limits  ETHANOL  DIFFERENTIAL  LIPASE, BLOOD  RAPID URINE DRUG SCREEN, HOSP PERFORMED  URINALYSIS, ROUTINE W REFLEX MICROSCOPIC  HIV ANTIBODY (ROUTINE TESTING W REFLEX)  LIPID PANEL  OSMOLALITY  BASIC METABOLIC PANEL    EKG EKG Interpretation Date/Time:  Wednesday March 21 2023 18:32:39 EDT Ventricular Rate:  69 PR Interval:  178 QRS Duration:  190 QT Interval:  478 QTC Calculation: 513 R Axis:   126  Text Interpretation: Sinus rhythm RBBB and LPFB Artifact in lead(s) I III aVR aVL aVF Confirmed by Gerhard Munch [phone]) on 03/21/2023 6:39:56 PM  Radiology CT ANGIO HEAD NECK W WO CM (CODE STROKE)  Result Date: 03/21/2023 CLINICAL DATA:  Neuro deficit, acute, stroke suspected.  Aphasia EXAM: CT ANGIOGRAPHY HEAD AND NECK WITH AND WITHOUT CONTRAST TECHNIQUE: Multidetector CT imaging of the head and neck was performed using the standard protocol during bolus administration of intravenous contrast. Multiplanar CT image reconstructions and MIPs were obtained to evaluate the vascular anatomy. Carotid stenosis measurements (when applicable) are obtained utilizing NASCET criteria, using the distal internal carotid diameter as the denominator. RADIATION DOSE REDUCTION: This exam was performed according to the departmental dose-optimization program which includes automated exposure control, adjustment of the mA and/or kV according to patient size and/or use of iterative reconstruction technique. CONTRAST:  OMNIPAQUE IOHEXOL 350 MG/ML SOLN COMPARISON:  None Available. FINDINGS: CTA NECK FINDINGS Aortic arch: Standard 3 vessel  aortic arch with mild atherosclerotic calcification. No significant stenosis of the arch vessel origins. Right carotid system: Patent with a moderately large amount of predominantly calcified plaque about the carotid bifurcation resulting in 40% stenosis of the ICA origin. Left carotid system: Patent with a moderately large amount of predominantly calcified plaque about the carotid bifurcation resulting in severe stenosis of the proximal external carotid artery but no significant stenosis of the common or internal carotid arteries. Vertebral arteries: Patent without evidence of significant stenosis or dissection. Strongly dominant left vertebral artery. Skeleton: Mild cervical spondylosis. Other neck: No evidence of cervical lymphadenopathy or mass. Upper chest: Partially visualized small pleural effusions, right larger than left. Nonspecific mosaic attenuation in the lungs bilaterally. Coronary atherosclerosis. Review of the MIP images confirms the above findings CTA HEAD FINDINGS Anterior circulation: The internal carotid arteries are patent from skull base to carotid termini with mild-to-moderate atherosclerotic calcification bilaterally not resulting in significant stenosis. ACAs and MCAs are patent without evidence of a proximal branch occlusion or significant proximal stenosis. No aneurysm is identified. Posterior circulation: The intracranial vertebral arteries are patent with the left supplying the basilar in the right terminating in PICA. Left V4 segment atherosclerosis results in mild stenosis. Patent PICA and SCA origins are visualized bilaterally. The basilar artery is widely patent. Posterior communicating arteries are diminutive or absent. The PCAs are patent with asymmetric right-sided branch vessel irregularity but no evidence of a flow limiting proximal stenosis. No aneurysm is identified. Venous sinuses: As permitted by contrast timing, patent. Anatomic variants: Hypoplastic right vertebral artery  terminating in PICA. Review of the MIP images confirms the above findings IMPRESSION: 1. No large vessel occlusion. 2. Age advanced atherosclerosis in the head and neck. No flow limiting proximal intracranial stenosis. 3. 40% right ICA origin stenosis. 4. Severe left external carotid artery origin stenosis. 5. Small pleural effusions. 6.  Aortic Atherosclerosis (ICD10-I70.0). Electronically Signed   By: Sebastian Ache M.D.   On: 03/21/2023 18:33   CT HEAD CODE STROKE WO CONTRAST  Result Date: 03/21/2023 CLINICAL DATA:  Code stroke. Neuro  deficit, acute, stroke suspected. Aphasia. EXAM: CT HEAD WITHOUT CONTRAST TECHNIQUE: Contiguous axial images were obtained from the base of the skull through the vertex without intravenous contrast. RADIATION DOSE REDUCTION: This exam was performed according to the departmental dose-optimization program which includes automated exposure control, adjustment of the mA and/or kV according to patient size and/or use of iterative reconstruction technique. COMPARISON:  None Available. FINDINGS: Brain: Small hypodensities in the white matter adjacent to the frontal horn of the right lateral ventricle are nonspecific but most resemble small-vessel ischemia, including a discrete, chronic appearing lacunar infarct anteriorly. The ventricles are normal in size. Vascular: Age advanced atherosclerosis at the skull base. Skull: No acute fracture or suspicious osseous lesion. Sinuses/Orbits: Visualized paranasal sinuses and mastoid air cells are clear. Unremarkable orbits. Other: None. ASPECTS ([place]berta Stroke Program Early CT Score) - Ganglionic level infarction (caudate, lentiform nuclei, internal capsule, insula, M1-M3 cortex): 7 - Supraganglionic infarction (M4-M6 cortex): 3 Total score (0-10 with 10 being normal): 10 These results were communicated to Dr. Derry Lory at 6:07 pm on 03/21/2023 by text page via the [facility] messaging system. IMPRESSION: 1. No evidence of acute cortical infarct or  intracranial hemorrhage. ASPECTS of 10. 2. Chronic small-vessel type changes in the right frontal periventricular white matter. 3. Age advanced atherosclerosis. Electronically Signed   By: Sebastian Ache M.D.   On: 03/21/2023 18:08    Procedures Procedures    Medications Ordered in ED Medications   stroke: early stages of recovery book (has no administration in time range)  acetaminophen (TYLENOL) tablet 650 mg (has no administration in time range)    Or  acetaminophen (TYLENOL) 160 MG/5ML solution 650 mg (has no administration in time range)    Or  acetaminophen (TYLENOL) suppository 650 mg (has no administration in time range)  insulin aspart (novoLOG) injection 0-9 Units (5 Units Subcutaneous Given 03/21/23 2143)  potassium chloride 10 mEq in 100 mL IVPB (10 mEq Intravenous New Bag/Given 03/21/23 2141)  iohexol (OMNIPAQUE) 350 MG/ML injection 150 mL (150 mLs Intravenous Contrast Given 03/21/23 1807)  fentaNYL (SUBLIMAZE) injection 50 mcg (50 mcg Intravenous Given 03/21/23 1845)  LORazepam (ATIVAN) injection 1 mg (1 mg Intravenous Given 03/21/23 2019)    ED Course/ Medical Decision Making/ A&P                                 Medical Decision Making Obese adult female with chronic pain presents as a code stroke with new aphasia.  Concern for polypharmacy versus stroke, given patient's aphasia.  Other considerations include hypertensive crisis, patient diastolic greater than 120. Patient had evaluation with our neurology colleagues, and initial head CT without obvious stroke, patient has MRI pending. Initial labs reassuring.  Amount and/or Complexity of Data Reviewed Independent Historian: EMS External Data Reviewed: notes. Labs: ordered. Decision-making details documented in ED Course. Radiology: ordered and independent interpretation performed. Decision-making details documented in ED Course. ECG/medicine tests: ordered and independent interpretation performed. Decision-making details  documented in ED Course.  Risk Prescription drug management. Decision regarding hospitalization.  Cardiac: 70 sr, nml  O2- 99% [place], abn  Patient had noted on repeat exam, continues to demonstrate word salad.  Given concern for this, MRI planned, CT angio, CT head both reviewed, no hemorrhage, atherosclerotic changes, no obvious flow-limiting lesion.  Case managed with neurology, discussed with internal medicine, patient admitted for further monitoring, management.        Final Clinical Impression(s) / ED  Diagnoses Final diagnoses:  Aphasia     Gerhard Munch, MD 03/21/23 2316

## 2023-03-21 NOTE — ED Notes (Signed)
MRI notified pt ready for scan at this time

## 2023-03-21 NOTE — ED Notes (Signed)
Patient back from MRI.

## 2023-03-21 NOTE — Consult Note (Signed)
Neurology Consultation  Reason for Consult: Code stroke  Referring Physician: Dr. Jeraldine Loots  CC: aphasia  History is obtained from: EMS and medical record   HPI: Karen Bennett is a 47 y.o. female with past medical history of PE on xarelto, anxiety and depression, morbid obesity, DM, HTN, HLD, HF, CKD, Right BKA, hypothyroidism, infective endocarditis who presents to [facility] ED via EMS for aphasia. Per EMS, she was at her physician appointment and was LKW @ 1610 by medical assistant and when MD came into room @ 1630 and found patient with word salad. Per EMS, she was only taking her xarelto and fluid pill, no other medications. EMS activated a code stroke. NIHSS 6. Code stroke CT head with no acute process, Aspects 10. CTA head and neck with no LVO( official read pending)   LKW: 1610 IV thrombolysis given?: no, on xarelto  EVT:  No LVO reason Premorbid modified Rankin scale (mRS):   3-Moderate disability-requires help but walks WITHOUT assistance  ROS:  Unable to obtain due to global aphasia   Past Medical History:  Diagnosis Date   Anxiety and depression 06/26/2013   Cellulitis    Chronic kidney disease, stage 3a (HCC) 01/11/2023   Diabetes mellitus    Hyperlipidemia associated with type 2 diabetes mellitus (HCC) 05/02/2013   Hypertension associated with diabetes (HCC) 12/27/2020   Hypothyroidism    Infective endocarditis    PE (pulmonary embolism) ~2007-2008   Not on anticoagulation     Family History  Problem Relation Age of Onset   Diabetes Mother    Coronary artery disease Mother    Stroke Mother      Social History:   reports that she has never smoked. She has never used smokeless tobacco. She reports that she does not drink alcohol and does not use drugs.  Medications No current facility-administered medications for this encounter.  Current Outpatient Medications:    acetaminophen (TYLENOL) 500 MG tablet, Take 500 mg by mouth every 6 (six) hours as needed for  moderate pain., Disp: , Rfl:    atorvastatin (LIPITOR) 40 MG tablet, Take 1 tablet (40 mg total) by mouth daily at 6 PM., Disp: 30 tablet, Rfl: 2   BIOTIN PO, Take 2 capsules by mouth daily. Unsure of dose, Disp: , Rfl:    buPROPion ER (WELLBUTRIN SR) 100 MG 12 hr tablet, Take 1 tablet (100 mg total) by mouth every morning., Disp: 30 tablet, Rfl: 0   diazepam (VALIUM) 5 MG tablet, Take 5 mg by mouth every 8 (eight) hours as needed for anxiety., Disp: , Rfl:    furosemide (LASIX) 20 MG tablet, Take 1 tablet (20 mg total) by mouth every other day., Disp: 30 tablet, Rfl: 0   glucose blood test strip, Use as instructed, Disp: 100 each, Rfl: 12   glucose monitoring kit (FREESTYLE) monitoring kit, 1 each by Does not apply route as needed for other., Disp: 1 each, Rfl: 0   hydrOXYzine (VISTARIL) 50 MG capsule, Take 50 mg by mouth every 8 (eight) hours as needed for itching., Disp: , Rfl:    insulin glargine (SEMGLEE, YFGN,) 100 UNIT/ML Solostar Pen, Inject 10 Units into the skin at bedtime., Disp: 15 mL, Rfl: 0   levothyroxine (SYNTHROID) 50 MCG tablet, Take 1 tablet (50 mcg total) by mouth daily at 6 (six) AM., Disp: 30 tablet, Rfl: 0   methocarbamol (ROBAXIN) 500 MG tablet, Take 1 tablet (500 mg total) by mouth 2 (two) times daily., Disp: 10 tablet, Rfl: 0  metoCLOPramide (REGLAN) 5 MG tablet, Take 1 tablet (5 mg total) by mouth every 6 (six) hours as needed for nausea., Disp: 30 tablet, Rfl: 1   Oxycodone HCl 10 MG TABS, Take 1 tablet (10 mg total) by mouth every 12 (twelve) hours as needed., Disp: 10 tablet, Rfl: 0   promethazine (PHENERGAN) 25 MG tablet, Take 1 tablet (25 mg total) by mouth 2 (two) times daily as needed., Disp: 15 tablet, Rfl: 0   traZODone (DESYREL) 150 MG tablet, Take 1 tablet (150 mg total) by mouth at bedtime., Disp: , Rfl:    XARELTO 20 MG TABS tablet, Take 1 tablet (20 mg total) by mouth in the morning. Start after finishing Xarelto started pack, Disp: 30 tablet, Rfl:  2   Exam: Current vital signs: Wt (!) 164 kg   LMP 04/13/2013   BMI 58.36 kg/m  Vital signs in last 24 hours: Weight:  [164 kg] 164 kg (08/28 1700)  GENERAL: Awake, alert in NAD HEENT: - Normocephalic and atraumatic, dry mm LUNGS - Clear to auscultation bilaterally with no wheezes CV - S1S2 RRR, no m/r/g, equal pulses bilaterally. ABDOMEN - obese, Soft, nontender, nondistended with normoactive BS Ext: warm, well perfused, intact peripheral pulses, no edema. Right BKA  NEURO:  Mental Status: awake and alert. With global aphasia and word salad  Language: speech is clear.   Cranial Nerves: PERRL EOMI, visual fields full, no facial asymmetry, facial sensation intact, hearing intact, tongue/uvula/soft palate midline, normal sternocleidomastoid and trapezius muscle strength. No evidence of tongue atrophy or fibrillations Motor: 5/5 in all 4 extremities  Tone: is normal and bulk is normal Sensation- Intact to light touch bilaterally Coordination: FTN intact bilaterally, no ataxia in BLE. Gait- deferred  NIHSS 1a Level of Conscious.: 0 1b LOC Questions: 2 1c LOC Commands: 2 2 Best Gaze: 0 3 Visual: 0 4 Facial Palsy: 0 5a Motor Arm - left: 0 5b Motor Arm - Right: 0 6a Motor Leg - Left: 0 6b Motor Leg - Right: 0 7 Limb Ataxia: 0 8 Sensory: 0 9 Best Language: 2 10 Dysarthria: 0 11 Extinct. and Inatten.: 0 TOTAL: 6   Labs I have reviewed labs in epic and the results pertinent to this consultation are:  CBC    Component Value Date/Time   WBC 5.0 02/21/2023 0603   RBC 3.51 (L) 02/21/2023 0603   HGB 14.3 03/21/2023 1749   HCT 42.0 03/21/2023 1749   PLT 245 02/21/2023 0603   MCV 91.5 02/21/2023 0603   MCH 28.2 02/21/2023 0603   MCHC 30.8 02/21/2023 0603   RDW 12.7 02/21/2023 0603   LYMPHSABS 1.8 02/14/2023 1435   MONOABS 0.5 02/14/2023 1435   EOSABS 0.2 02/14/2023 1435   BASOSABS 0.0 02/14/2023 1435    CMP     Component Value Date/Time   NA 133 (L) 03/21/2023  1749   K 3.5 03/21/2023 1749   CL 103 03/21/2023 1749   CO2 31 02/23/2023 0530   GLUCOSE 270 (H) 03/21/2023 1749   BUN 15 03/21/2023 1749   CREATININE 1.30 (H) 03/21/2023 1749   CREATININE 0.70 09/01/2013 0954   CALCIUM 8.4 (L) 02/23/2023 0530   PROT 6.5 02/21/2023 0603   ALBUMIN 2.3 (L) 02/21/2023 0603   AST 10 (L) 02/21/2023 0603   ALT 8 02/21/2023 0603   ALKPHOS 49 02/21/2023 0603   BILITOT 0.8 02/21/2023 0603   GFRNONAA 31 (L) 02/23/2023 0530   GFRNONAA >89 09/01/2013 0954   GFRAA >89 09/01/2013 1610  Lipid Panel     Component Value Date/Time   CHOL 214 (H) 02/10/2023 1444   TRIG 208 (H) 02/10/2023 1444   HDL 31 (L) 02/10/2023 1444   CHOLHDL 6.9 02/10/2023 1444   VLDL 42 (H) 02/10/2023 1444   LDLCALC 141 (H) 02/10/2023 1444    Lab Results  Component Value Date   HGBA1C 10.7 (H) 01/12/2023      Imaging I have reviewed the images obtained:  CT-head with no acute process   CTA head and neck with no LVO. Offical read pending    Assessment:  47 y.o. female with past medical history of PE on xarelto, anxiety and depression, DM, HTN, HLD, CKD, Right BKA, hypothyroidism, infective endocarditis who presents to [facility] ED via EMS for aphasia. Per EMS, she was at her physician appointment and was LKW @ 1610 by medical assistant and when MD came into room @ 1630 and found patient with word salad.   Recommendations: -admit to hospitalist for stroke workup  - HgbA1c, fasting lipid panel - MRI of the brain without contrast. If unable to obtain MRI due to body habitus, check CT head w/o in 24 hours  - Frequent neuro checks - Echocardiogram - CTA head and neck - Risk factor modification - Telemetry monitoring - PT consult, OT consult, Speech consult - Stroke team to follow  Gevena Mart DNP, ACNPC-AG  [facility]   NEUROHOSPITALIST ADDENDUM Performed a face to face diagnostic evaluation.   I have reviewed the contents of history and physical exam as  documented by PA/ARNP/Resident and agree with above documentation.  I have discussed and formulated the above plan as documented. Edits to the note have been made as needed.  Impression/Key exam findings/Plan: Developed acute onset aphasia PCP clinic and she was sent to the ED.  She has been on Xarelto and thus not a candidate for TNKase.  She does not have an LVO and is thus not a candidate for thrombectomy.  On exam she has both sensory and expressive aphasia.  Presentation most concerning for a ischemic stroke.  Will get stroke workup.  With her elevated BMI, she may not be able to get an MRI but we can always try.  If you are unable to get an MRI, we can always get a repeat CT head in 24 hours.  Erick Blinks, MD [facility] [phone]   If 7pm to 7am, please call on call as listed on AMION.

## 2023-03-21 NOTE — Code Documentation (Signed)
Stroke Response Nurse Documentation Code Documentation  Karen Bennett is a 47 y.o. female arriving to [facility]  via [facility] on 03/21/23 with past medical hx of PE on xarelto, anxiety and depression, DM, HTN, HLD, CKD, Right BKA, hypothyroidism, infective endocarditis . On Xarelto (rivaroxaban) daily. Code stroke was activated by EMS.   Patient from physicians office  where she was LKW at 1610 and now complaining of aphasia . Pt from doctors office where she was LKW at 1610 when vitals taken. When Md in to see pt at 1630 she was noted to have word salad and difficulty communicating.   Stroke team at the bedside on patient arrival. Labs drawn and patient cleared for CT by Dr. Jeraldine Loots. Patient to CT with team. NIHSS 11, see documentation for details and code stroke times. The following imaging was completed:  CT Head and CTA. Patient is not a candidate for IV Thrombolytic due to On Xarelto. Patient is not a candidate for IR due to no LVO.   Care Plan: Q2H vitals and neuro checks.   Bedside handoff given to ED RN.    Mordecai Rasmussen  Stroke Response RN

## 2023-03-21 NOTE — ED Notes (Signed)
Taken for MRI at this time.

## 2023-03-21 NOTE — H&P (Signed)
History and Physical    Karen Bennett HYQ:[id number] DOB: Jan 13, 1976 DOA: 03/21/2023  PCP: [name], NP  Chief Complaint: Code stroke  HPI: Karen Bennett is a 47 y.o. female with medical history significant of CKD stage IIIa, type 2 diabetes, hyperlipidemia, hypertension, hypothyroidism, PE on Xarelto, anxiety, depression, morbid obesity, right BKA, lumbar radiculopathy, infective endocarditis presented to the ED with acute onset aphasia/word salad.  She was sent here by her PCP, LKW 1610.  Brought in by EMS as code stroke.  Blood pressure elevated in the ED (168/126 on arrival). Labs significant for sodium 130, potassium 3.4, bicarb 17, anion gap 13, glucose 271, creatinine 1.3 (improved compared to labs done 3-4 weeks ago), calcium 8.6, albumin 1.9, blood ethanol level <10, INR 1.6, A1c 8.2, UA and UDS pending.  CT head without contrast showing: IMPRESSION: 1. No evidence of acute cortical infarct or intracranial hemorrhage. ASPECTS of 10. 2. Chronic small-vessel type changes in the right frontal periventricular white matter. 3. Age advanced atherosclerosis.   CTA head and neck showing: IMPRESSION: 1. No large vessel occlusion. 2. Age advanced atherosclerosis in the head and neck. No flow limiting proximal intracranial stenosis. 3. 40% right ICA origin stenosis. 4. Severe left external carotid artery origin stenosis. 5. Small pleural effusions. 6.  Aortic Atherosclerosis (ICD10-I70.0).   Neurology felt she was not a candidate for IV thrombolytics as she is chronically anticoagulated with Xarelto.  Recommended admission by hospitalist service for brain MRI and stroke workup.  Patient received fentanyl and Ativan in the ED. TR[facility] called to admit for stroke workup.  Review of Systems:  Review of Systems  All other systems reviewed and are negative.   Past Medical History:  Diagnosis Date   Anxiety and depression 06/26/2013   Cellulitis    Chronic kidney disease, stage  3a (HCC) 01/11/2023   Diabetes mellitus    Hyperlipidemia associated with type 2 diabetes mellitus (HCC) 05/02/2013   Hypertension associated with diabetes (HCC) 12/27/2020   Hypothyroidism    Infective endocarditis    PE (pulmonary embolism) ~2007-2008   Not on anticoagulation    Past Surgical History:  Procedure Laterality Date   Abscess removal from L groin     APPLICATION OF WOUND VAC Left 05/01/2013   Procedure: APPLICATION OF WOUND VAC;  Surgeon: Kerrin Champagne, MD;  Location: [facility];  Service: Orthopedics;  Laterality: Left;   BELOW KNEE LEG AMPUTATION Right    CYSTOSCOPY W/ URETERAL STENT PLACEMENT Right 09/03/2021   Procedure: CYSTOSCOPY WITH RETROGRADE PYELOGRAM/URETERAL STENT PLACEMENT;  Surgeon: Marcine Matar, MD;  Location: [facility];  Service: Urology;  Laterality: Right;   I & D EXTREMITY  11/12/2011   Procedure: IRRIGATION AND DEBRIDEMENT EXTREMITY;  Surgeon: Kathryne Hitch, MD;  Location: [facility];  Service: Orthopedics;  Laterality: Left;  foot left   I & D EXTREMITY Left 09/06/2012   Procedure: IRRIGATION AND DEBRIDEMENT EXTREMITY;  Surgeon: Eldred Manges, MD;  Location: [facility];  Service: Orthopedics;  Laterality: Left;   I & D EXTREMITY Left 05/01/2013   Procedure: INCISION AND DRAINAGE LEFT FOREFOOT ABCESS ;  Surgeon: Kerrin Champagne, MD;  Location: [facility];  Service: Orthopedics;  Laterality: Left;   INCISION AND DRAINAGE ABSCESS Left 12/27/2020   Procedure: INCISION AND DRAINAGE LEFT THIGH ABSCESS;  Surgeon: Berna Bue, MD;  Location: [facility];  Service: General;  Laterality: Left;   IR FLUORO GUIDE CV LINE RIGHT  09/12/2021   IR PTA VENOUS EXCEPT DIALYSIS  CIRCUIT  01/04/2021   IR RADIOLOGIST EVAL & MGMT  10/25/2021   IR US GUIDE VASC ACCESS RIGHT  09/12/2021   IR VENO/EXT/UNI RIGHT  01/04/2021   Surgery to remove hematoma in L leg     TEE WITHOUT CARDIOVERSION N/A 01/03/2021   Procedure: TRANSESOPHAGEAL ECHOCARDIOGRAM (TEE);  Surgeon: Quintella Reichert,  MD;  Location: [facility];  Service: Cardiovascular;  Laterality: N/A;   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     reports that she has never smoked. She has never used smokeless tobacco. She reports that she does not drink alcohol and does not use drugs.  Allergies  Allergen Reactions   Clindamycin Diarrhea and Nausea And Vomiting   Zofran Itching and Nausea And Vomiting   Doxycycline Diarrhea and Nausea And Vomiting   Morphine And Codeine Hives, Itching and Rash    Family History  Problem Relation Age of Onset   Diabetes Mother    Coronary artery disease Mother    Stroke Mother     Prior to Admission medications   Medication Sig Start Date End Date Taking? Authorizing Provider  acetaminophen (TYLENOL) 500 MG tablet Take 500 mg by mouth every 6 (six) hours as needed for moderate pain.    [provider]  atorvastatin (LIPITOR) 40 MG tablet Take 1 tablet (40 mg total) by mouth daily at 6 PM. 02/23/23 05/24/23  Narda Bonds, MD  BIOTIN PO Take 2 capsules by mouth daily. Unsure of dose    [provider]  buPROPion ER (WELLBUTRIN SR) 100 MG 12 hr tablet Take 1 tablet (100 mg total) by mouth every morning. 02/23/23 03/25/23  Narda Bonds, MD  diazepam (VALIUM) 5 MG tablet Take 5 mg by mouth every 8 (eight) hours as needed for anxiety. 03/30/21   [provider]  furosemide (LASIX) 20 MG tablet Take 1 tablet (20 mg total) by mouth every other day. 02/23/23 03/25/23  Narda Bonds, MD  glucose blood test strip Use as instructed 06/26/13   Doris Cheadle, MD  glucose monitoring kit (FREESTYLE) monitoring kit 1 each by Does not apply route as needed for other. 06/26/13   Doris Cheadle, MD  hydrOXYzine (VISTARIL) 50 MG capsule Take 50 mg by mouth every 8 (eight) hours as needed for itching.    [provider]  insulin glargine (SEMGLEE, YFGN,) 100 UNIT/ML Solostar Pen Inject 10 Units into the skin at bedtime. 02/23/23   Narda Bonds, MD  levothyroxine  (SYNTHROID) 50 MCG tablet Take 1 tablet (50 mcg total) by mouth daily at 6 (six) AM. 02/23/23   Narda Bonds, MD  methocarbamol (ROBAXIN) 500 MG tablet Take 1 tablet (500 mg total) by mouth 2 (two) times daily. 03/13/23   Gerhard Munch, MD  metoCLOPramide (REGLAN) 5 MG tablet Take 1 tablet (5 mg total) by mouth every 6 (six) hours as needed for nausea. 02/09/21   Hollice Espy, MD  Oxycodone HCl 10 MG TABS Take 1 tablet (10 mg total) by mouth every 12 (twelve) hours as needed. 03/13/23   Gerhard Munch, MD  promethazine (PHENERGAN) 25 MG tablet Take 1 tablet (25 mg total) by mouth 2 (two) times daily as needed. 03/13/23   Gerhard Munch, MD  traZODone (DESYREL) 150 MG tablet Take 1 tablet (150 mg total) by mouth at bedtime. 03/02/22   Lewie Chamber, MD  XARELTO 20 MG TABS tablet Take 1 tablet (20 mg total) by mouth in the morning. Start  after finishing Xarelto started pack 02/23/23 05/24/23  Narda Bonds, MD    Physical Exam: Vitals:   03/21/23 1700 03/21/23 1800 03/21/23 1830 03/21/23 1833  BP:  (!) 168/126 (!) 171/96   Pulse:  72 71   Resp:  20 (!) 21   SpO2:  97% 95%   Weight: (!) 164 kg   (!) 164 kg  Height:    5\' 6"  (1.676 m)    Physical Exam Vitals reviewed.  Constitutional:      General: She is not in acute distress. HENT:     Head: Normocephalic and atraumatic.  Eyes:     Extraocular Movements: Extraocular movements intact.  Cardiovascular:     Rate and Rhythm: Normal rate and regular rhythm.     Pulses: Normal pulses.  Pulmonary:     Effort: Pulmonary effort is normal. No respiratory distress.     Breath sounds: No wheezing or rales.  Abdominal:     General: Bowel sounds are normal. There is no distension.     Palpations: Abdomen is soft.     Tenderness: There is no abdominal tenderness. There is no guarding.  Musculoskeletal:     Cervical back: Normal range of motion.     Left lower leg: Edema present.     Comments: Right BKA  Skin:    General: Skin is  warm and dry.  Neurological:     Mental Status: She is alert and oriented to person, place, and time.     Comments: Patient with aphasia/word salad. No facial droop Strength 5 out of 5 in bilateral upper and lower extremities and sensation to light touch intact throughout.     Labs on Admission: I have personally reviewed following labs and imaging studies  CBC: Recent Labs  Lab 03/21/23 1749 03/21/23 1750  WBC  --  7.2  NEUTROABS  --  4.4  HGB 14.3 14.1  HCT 42.0 42.9  MCV  --  83.3  PLT  --  277   Basic Metabolic Panel: Recent Labs  Lab 03/21/23 1749 03/21/23 1750  NA 133* 130*  K 3.5 3.4*  CL 103 100  CO2  --  17*  GLUCOSE 270* 271*  BUN 15 13  CREATININE 1.30* 1.33*  CALCIUM  --  8.6*   GFR: Estimated Creatinine Clearance: 85.3 mL/min (A) (by C-G formula based on SCr of 1.33 mg/dL (H)). Liver Function Tests: Recent Labs  Lab 03/21/23 1750  AST 11*  ALT 11  ALKPHOS 65  BILITOT 1.0  PROT 6.4*  ALBUMIN 1.9*   No results for input(s): "LIPASE", "AMYLASE" in the last 168 hours. No results for input(s): "AMMONIA" in the last 168 hours. Coagulation Profile: Recent Labs  Lab 03/21/23 1750  INR 1.6*   Cardiac Enzymes: No results for input(s): "CKTOTAL", "CKMB", "CKMBINDEX", "TROPONINI" in the last 168 hours. BNP (last 3 results) No results for input(s): "PROBNP" in the last 8760 hours. HbA1C: No results for input(s): "HGBA1C" in the last 72 hours. CBG: Recent Labs  Lab 03/21/23 1742  GLUCAP 273*   Lipid Profile: No results for input(s): "CHOL", "HDL", "LDLCALC", "TRIG", "CHOLHDL", "LDLDIRECT" in the last 72 hours. Thyroid Function Tests: No results for input(s): "TSH", "T4TOTAL", "FREET4", "T3FREE", "THYROIDAB" in the last 72 hours. Anemia Panel: No results for input(s): "VITAMINB12", "FOLATE", "FERRITIN", "TIBC", "IRON", "RETICCTPCT" in the last 72 hours. Urine analysis:    Component Value Date/Time   COLORURINE YELLOW 02/23/2022 0611    APPEARANCEUR HAZY (A) 02/23/2022 1610  LABSPEC 1.011 02/23/2022 0611   PHURINE 6.0 02/23/2022 0611   GLUCOSEU 50 (A) 02/23/2022 0611   HGBUR LARGE (A) 02/23/2022 0611   BILIRUBINUR NEGATIVE 02/23/2022 0611   KETONESUR NEGATIVE 02/23/2022 0611   PROTEINUR 100 (A) 02/23/2022 0611   UROBILINOGEN 0.2 09/05/2011 0558   NITRITE NEGATIVE 02/23/2022 0611   LEUKOCYTESUR LARGE (A) 02/23/2022 0611    Radiological Exams on Admission: CT ANGIO HEAD NECK W WO CM (CODE STROKE)  Result Date: 03/21/2023 CLINICAL DATA:  Neuro deficit, acute, stroke suspected.  Aphasia EXAM: CT ANGIOGRAPHY HEAD AND NECK WITH AND WITHOUT CONTRAST TECHNIQUE: Multidetector CT imaging of the head and neck was performed using the standard protocol during bolus administration of intravenous contrast. Multiplanar CT image reconstructions and MIPs were obtained to evaluate the vascular anatomy. Carotid stenosis measurements (when applicable) are obtained utilizing NASCET criteria, using the distal internal carotid diameter as the denominator. RADIATION DOSE REDUCTION: This exam was performed according to the departmental dose-optimization program which includes automated exposure control, adjustment of the mA and/or kV according to patient size and/or use of iterative reconstruction technique. CONTRAST:  OMNIPAQUE IOHEXOL 350 MG/ML SOLN COMPARISON:  None Available. FINDINGS: CTA NECK FINDINGS Aortic arch: Standard 3 vessel aortic arch with mild atherosclerotic calcification. No significant stenosis of the arch vessel origins. Right carotid system: Patent with a moderately large amount of predominantly calcified plaque about the carotid bifurcation resulting in 40% stenosis of the ICA origin. Left carotid system: Patent with a moderately large amount of predominantly calcified plaque about the carotid bifurcation resulting in severe stenosis of the proximal external carotid artery but no significant stenosis of the common or internal  carotid arteries. Vertebral arteries: Patent without evidence of significant stenosis or dissection. Strongly dominant left vertebral artery. Skeleton: Mild cervical spondylosis. Other neck: No evidence of cervical lymphadenopathy or mass. Upper chest: Partially visualized small pleural effusions, right larger than left. Nonspecific mosaic attenuation in the lungs bilaterally. Coronary atherosclerosis. Review of the MIP images confirms the above findings CTA HEAD FINDINGS Anterior circulation: The internal carotid arteries are patent from skull base to carotid termini with mild-to-moderate atherosclerotic calcification bilaterally not resulting in significant stenosis. ACAs and MCAs are patent without evidence of a proximal branch occlusion or significant proximal stenosis. No aneurysm is identified. Posterior circulation: The intracranial vertebral arteries are patent with the left supplying the basilar in the right terminating in PICA. Left V4 segment atherosclerosis results in mild stenosis. Patent PICA and SCA origins are visualized bilaterally. The basilar artery is widely patent. Posterior communicating arteries are diminutive or absent. The PCAs are patent with asymmetric right-sided branch vessel irregularity but no evidence of a flow limiting proximal stenosis. No aneurysm is identified. Venous sinuses: As permitted by contrast timing, patent. Anatomic variants: Hypoplastic right vertebral artery terminating in PICA. Review of the MIP images confirms the above findings IMPRESSION: 1. No large vessel occlusion. 2. Age advanced atherosclerosis in the head and neck. No flow limiting proximal intracranial stenosis. 3. 40% right ICA origin stenosis. 4. Severe left external carotid artery origin stenosis. 5. Small pleural effusions. 6.  Aortic Atherosclerosis (ICD10-I70.0). Electronically Signed   By: Sebastian Ache M.D.   On: 03/21/2023 18:33   CT HEAD CODE STROKE WO CONTRAST  Result Date: 03/21/2023 CLINICAL  DATA:  Code stroke. Neuro deficit, acute, stroke suspected. Aphasia. EXAM: CT HEAD WITHOUT CONTRAST TECHNIQUE: Contiguous axial images were obtained from the base of the skull through the vertex without intravenous contrast. RADIATION DOSE REDUCTION: This exam  was performed according to the departmental dose-optimization program which includes automated exposure control, adjustment of the mA and/or kV according to patient size and/or use of iterative reconstruction technique. COMPARISON:  None Available. FINDINGS: Brain: Small hypodensities in the white matter adjacent to the frontal horn of the right lateral ventricle are nonspecific but most resemble small-vessel ischemia, including a discrete, chronic appearing lacunar infarct anteriorly. The ventricles are normal in size. Vascular: Age advanced atherosclerosis at the skull base. Skull: No acute fracture or suspicious osseous lesion. Sinuses/Orbits: Visualized paranasal sinuses and mastoid air cells are clear. Unremarkable orbits. Other: None. ASPECTS ([place]ta Stroke Program Early CT Score) - Ganglionic level infarction (caudate, lentiform nuclei, internal capsule, insula, M1-M3 cortex): 7 - Supraganglionic infarction (M4-M6 cortex): 3 Total score (0-10 with 10 being normal): 10 These results were communicated to Dr. Derry Lory at 6:07 pm on 03/21/2023 by text page via the [facility] messaging system. IMPRESSION: 1. No evidence of acute cortical infarct or intracranial hemorrhage. ASPECTS of 10. 2. Chronic small-vessel type changes in the right frontal periventricular white matter. 3. Age advanced atherosclerosis. Electronically Signed   By: Sebastian Ache M.D.   On: 03/21/2023 18:08    EKG: Independently reviewed.  Sinus rhythm, LPFB, RBBB, QTc 513.  Poor quality study with artifact.  No significant change since prior tracing.  Assessment and Plan  Acute onset aphasia/word salad LKW 1610.  CT head negative for acute intracranial process.  CTA head and neck  negative for LVO but showing advanced atherosclerosis in the head and neck without flow-limiting proximal intracranial stenosis.  Also showing 40% right ICA origin stenosis and severe left external carotid artery origin stenosis.  Neurology felt she was not a candidate for IV thrombolytics as she is chronically anticoagulated with Xarelto. -Admitted for stroke workup -Telemetry monitoring -MRI of the brain without contrast.  If unable to obtain MRI due to body habitus, then neurology recommending repeating head CT without contrast in 24 hours. -Echocardiogram -Hemoglobin A1c 8.2, fasting lipid panel ordered -Frequent neurochecks -PT, OT, speech therapy. -N.p.o. until cleared by bedside swallow evaluation or formal speech evaluation  Mild hyponatremia Sodium slightly low on previous labs as well.  Patient was recently admitted for volume overload in the setting of excessive water intake and was placed on Lasix.  Last echo done in June 2024 showing EF 50 to 55%, grade 2 diastolic dysfunction, mild to moderate MVR, severely elevated pulmonary artery systolic pressure.  Hold Lasix at this time to allow permissive hypertension given acute strokelike symptoms.  Check serum osmolarity.  Small pleural effusions Seen on CTA head and neck.  No respiratory distress or hypoxia.  Lasix held at this time to allow permissive hypertension.  Mild hypokalemia QT prolongation Replace potassium and check magnesium level.  Repeat EKG in the morning.  Avoid QT prolonging drugs.  Insulin-dependent type 2 diabetes Glucose 271.  Bicarb 17 with normal anion gap in the setting of diuretic use.  UA pending to check for ketones.  A1c 8.2.  Sensitive sliding scale insulin every 4 hours until patient passes swallow eval.  Check beta hydroxybutyric acid level.  Continue to monitor metabolic panel closely.  CKD stage IIIa Creatinine 1.3, improved compared to labs in 3-4 weeks ago.  Continue to monitor  labs.  Hypertension Systolic currently in the 170s.  Allow permissive hypertension at this time.  Recommendations per neurology.  Hyperlipidemia Hypothyroidism History of PE Anxiety, depression Unable to safely order home medications at this time as pharmacy medication reconciliation is pending.  DVT prophylaxis: Continue Xarelto after pharmacy med rec is done. Code Status: Full Code (discussed with the patient) Family Communication: No family available at this time. Consults called: Neurology Level of care: Telemetry bed Admission status: It is my clinical opinion that referral for OBSERVATION is reasonable and necessary in this patient based on the above information provided. The aforementioned taken together are felt to place the patient at high risk for further clinical deterioration. However, it is anticipated that the patient may be medically stable for discharge from the hospital within 24 to 48 hours.  John Giovanni MD [facility]  If 7PM-7AM, please contact night-coverage [email or web address]  03/21/2023, 7:25 PM

## 2023-03-21 NOTE — ED Notes (Signed)
Dr. Loney Loh made aware regarding pt elevated BP at this time. Reports to monitor BP at this time. No new orders. States to keep systolic BP below 220.

## 2023-03-22 ENCOUNTER — Other Ambulatory Visit: Payer: Self-pay

## 2023-03-22 ENCOUNTER — Observation Stay (HOSPITAL_BASED_OUTPATIENT_CLINIC_OR_DEPARTMENT_OTHER): Payer: 59

## 2023-03-22 ENCOUNTER — Observation Stay (HOSPITAL_COMMUNITY): Payer: 59

## 2023-03-22 ENCOUNTER — Other Ambulatory Visit (HOSPITAL_COMMUNITY): Payer: Self-pay

## 2023-03-22 ENCOUNTER — Telehealth (HOSPITAL_COMMUNITY): Payer: Self-pay | Admitting: Pharmacy Technician

## 2023-03-22 ENCOUNTER — Encounter (HOSPITAL_COMMUNITY): Payer: Self-pay | Admitting: Internal Medicine

## 2023-03-22 DIAGNOSIS — R4701 Aphasia: Secondary | ICD-10-CM | POA: Diagnosis not present

## 2023-03-22 DIAGNOSIS — I10 Essential (primary) hypertension: Secondary | ICD-10-CM

## 2023-03-22 DIAGNOSIS — I639 Cerebral infarction, unspecified: Secondary | ICD-10-CM

## 2023-03-22 LAB — RAPID URINE DRUG SCREEN, HOSP PERFORMED
Amphetamines: NOT DETECTED
Barbiturates: NOT DETECTED
Benzodiazepines: POSITIVE — AB
Cocaine: NOT DETECTED
Opiates: NOT DETECTED
Tetrahydrocannabinol: POSITIVE — AB

## 2023-03-22 LAB — CBG MONITORING, ED
Glucose-Capillary: 126 mg/dL — ABNORMAL HIGH (ref 70–99)
Glucose-Capillary: 168 mg/dL — ABNORMAL HIGH (ref 70–99)
Glucose-Capillary: 229 mg/dL — ABNORMAL HIGH (ref 70–99)

## 2023-03-22 LAB — URINALYSIS, ROUTINE W REFLEX MICROSCOPIC
Bilirubin Urine: NEGATIVE
Glucose, UA: >=500 mg/dL — AB
Ketones, ur: NEGATIVE mg/dL
Nitrite: POSITIVE — AB
Protein, ur: >=300 mg/dL — AB
RBC / HPF: >50 RBC/hpf (ref 0–5)
Specific Gravity, Urine: 1.04 — ABNORMAL HIGH (ref 1.005–1.030)
WBC, UA: >50 WBC/hpf (ref 0–5)
pH: 5 (ref 5.0–8.0)

## 2023-03-22 LAB — LIPID PANEL
Cholesterol: 340 mg/dL — ABNORMAL HIGH (ref 0–200)
HDL: 35 mg/dL — ABNORMAL LOW (ref >40)
LDL Cholesterol: 238 mg/dL — ABNORMAL HIGH (ref 0–99)
Total CHOL/HDL Ratio: 9.7 RATIO
Triglycerides: 336 mg/dL — ABNORMAL HIGH (ref <150)
VLDL: 67 mg/dL — ABNORMAL HIGH (ref 0–40)

## 2023-03-22 LAB — GLUCOSE, CAPILLARY
Glucose-Capillary: 148 mg/dL — ABNORMAL HIGH (ref 70–99)
Glucose-Capillary: 120 mg/dL — ABNORMAL HIGH (ref 70–99)
Glucose-Capillary: 125 mg/dL — ABNORMAL HIGH (ref 70–99)

## 2023-03-22 LAB — ECHOCARDIOGRAM LIMITED BUBBLE STUDY
Area-P 1/2: 3.42 cm2
Est EF: 45
Height: 66 in
S' Lateral: 4.3 cm
Weight: 5784.87 [oz_av]

## 2023-03-22 LAB — BASIC METABOLIC PANEL
Anion gap: 6 (ref 5–15)
BUN: 14 mg/dL (ref 6–20)
CO2: 22 mmol/L (ref 22–32)
Calcium: 8.4 mg/dL — ABNORMAL LOW (ref 8.9–10.3)
Chloride: 104 mmol/L (ref 98–111)
Creatinine, Ser: 1.71 mg/dL — ABNORMAL HIGH (ref 0.44–1.00)
GFR, Estimated: 37 mL/min — ABNORMAL LOW (ref >60)
Glucose, Bld: 146 mg/dL — ABNORMAL HIGH (ref 70–99)
Potassium: 3.5 mmol/L (ref 3.5–5.1)
Sodium: 132 mmol/L — ABNORMAL LOW (ref 135–145)

## 2023-03-22 MED ORDER — ENSURE ENLIVE PO LIQD: 237.0000 mL | Freq: Two times a day (BID) | ORAL | Status: DC

## 2023-03-22 MED ORDER — APIXABAN 5 MG PO TABS
5.0000 mg | ORAL_TABLET | Freq: Two times a day (BID) | ORAL | Status: DC
  Administered 2023-03-23: 5 mg via ORAL
  Filled 2023-03-22: qty 1

## 2023-03-22 MED ORDER — OXYCODONE HCL 5 MG PO TABS
10.0000 mg | ORAL_TABLET | Freq: Two times a day (BID) | ORAL | Status: DC | PRN
  Administered 2023-03-22 – 2023-03-23 (×3): 10 mg via ORAL
  Filled 2023-03-22 (×3): qty 2

## 2023-03-22 MED ORDER — ATORVASTATIN CALCIUM 40 MG PO TABS
40.0000 mg | ORAL_TABLET | Freq: Every day | ORAL | Status: DC
  Administered 2023-03-22 – 2023-03-23 (×2): 40 mg via ORAL
  Filled 2023-03-22 (×2): qty 1

## 2023-03-22 MED ORDER — LEVOTHYROXINE SODIUM 50 MCG PO TABS
50.0000 ug | ORAL_TABLET | Freq: Every day | ORAL | Status: DC
  Administered 2023-03-22 – 2023-03-23 (×2): 50 ug via ORAL
  Filled 2023-03-22 (×2): qty 1

## 2023-03-22 MED ORDER — PERFLUTREN LIPID MICROSPHERE
1.0000 mL | INTRAVENOUS | Status: AC | PRN
  Administered 2023-03-22: 8 mL via INTRAVENOUS

## 2023-03-22 MED ORDER — SODIUM CHLORIDE 0.9 % IV SOLN: INTRAVENOUS | Status: DC

## 2023-03-22 MED ORDER — LIVING WELL WITH DIABETES BOOK
Freq: Once | Status: AC
  Filled 2023-03-22: qty 1

## 2023-03-22 MED ORDER — RIVAROXABAN 10 MG PO TABS
20.0000 mg | ORAL_TABLET | Freq: Every morning | ORAL | Status: DC
  Administered 2023-03-22: 20 mg via ORAL
  Filled 2023-03-22: qty 2

## 2023-03-22 MED ORDER — MAGNESIUM SULFATE 2 GM/50ML IV SOLN
2.0000 g | Freq: Once | INTRAVENOUS | Status: AC
  Administered 2023-03-22: 2 g via INTRAVENOUS
  Filled 2023-03-22: qty 50

## 2023-03-22 MED ORDER — PROMETHAZINE HCL 12.5 MG PO TABS
25.0000 mg | ORAL_TABLET | Freq: Two times a day (BID) | ORAL | Status: DC | PRN
  Administered 2023-03-22 – 2023-03-23 (×3): 25 mg via ORAL
  Filled 2023-03-22 (×3): qty 2

## 2023-03-22 MED ORDER — ACETAMINOPHEN 500 MG PO TABS: 500.0000 mg | ORAL_TABLET | Freq: Four times a day (QID) | ORAL | Status: DC | PRN

## 2023-03-22 NOTE — TOC Benefit Eligibility Note (Signed)
Patient Product/process development scientist completed.    The patient is insured through [facility]. Patient has Medicare and is not eligible for a copay card, but may be able to apply for patient assistance, if available.    Ran test claim for [employer] and Requires Prior Authorization   This test claim was processed through [employer]- copay amounts may vary at other pharmacies due to [employer], or as the patient moves through the different stages of their insurance plan.     Roland Earl, CPHT Pharmacy Technician III Certified Patient Advocate [facility] Patient Advocate Team Direct Number: [phone]  Fax: [phone]

## 2023-03-22 NOTE — Progress Notes (Signed)
TCD w/ bubble study completed.   Please see CV Proc for preliminary results.   Rachel Hodge, RDMS, RVT  

## 2023-03-22 NOTE — Evaluation (Signed)
Speech Language Pathology Evaluation Patient Details Name: Karen Bennett MRN: [id number] DOB: 1976-06-29 Today's Date: 03/22/2023 Time: 5621-3086 SLP Time Calculation (min) (ACUTE ONLY): 14 min  Problem List:  Patient Active Problem List   Diagnosis Date Noted   Aphasia 03/21/2023   QT prolongation 03/21/2023   CKD (chronic kidney disease) 02/09/2023   Paraplegia (HCC) 02/09/2023   Acute pulmonary embolism (HCC) 01/25/2023   Acute on chronic heart failure with preserved ejection fraction (HFpEF) (HCC) 01/11/2023   Chronic kidney disease, stage 3a (HCC) 01/11/2023   Acute pain of right lower extremity 07/04/2022   History of femur fracture 02/23/2022   Bacteremia 02/22/2022   Hypothyroidism    Hyperglycemia 10/24/2021   Hx of bacterial endocarditis 10/24/2021   Pyuria 10/24/2021   Candida rash of groin 09/11/2021   Pressure injury of back, stage 2 (HCC) 09/05/2021   Obstruction of right ureteropelvic junction (UPJ) due to stone 09/04/2021   Sepsis secondary to UTI (HCC) 09/03/2021   Diabetic hyperosmolar non-ketotic state (HCC) 09/03/2021   Stenosis of subclavian artery (HCC) 02/09/2021   Sepsis (HCC) 02/01/2021   Mixed hyperlipidemia 01/12/2021   Infective endocarditis    Pressure injury of skin 12/28/2020   BMI 60.0-69.9, adult (HCC) 12/27/2020   Hypertension associated with diabetes (HCC) 12/27/2020   History of pulmonary embolism 12/27/2020   Necrotizing fasciitis of lower leg (HCC) Thigh  12/27/2020   Cellulitis 12/26/2020   History of DVT (deep vein thrombosis) 11/12/2020   Tobacco abuse 12/31/2017   Hx of right BKA (HCC) 12/31/2017   Closed fracture of right distal femur (HCC) 12/31/2017   Hirsutism 06/21/2016   Noncompliance with treatment plan 04/19/2015   Anxiety and depression 06/26/2013   Normocytic anemia 05/22/2013   Depression 05/22/2013   Grief reaction 05/22/2013   AKI (acute kidney injury) (HCC) 05/07/2013   Phantom limb pain (HCC) 05/06/2013    Hyperlipidemia associated with type 2 diabetes mellitus (HCC) 05/02/2013   Hypokalemia 05/01/2013   Diabetic peripheral neuropathy associated with type 2 diabetes mellitus (HCC) 04/30/2013    Class: Chronic   MSSA (methicillin susceptible Staphylococcus aureus) 09/06/2012   Foot osteomyelitis, left (HCC) 09/03/2012   Pulmonary emboli (HCC) 11/11/2011   Hyponatremia 09/05/2011   Diabetic foot ulcer (HCC) 09/05/2011   Obesity, Class III, BMI 40-49.9 (morbid obesity) (HCC) 08/15/2011   Insulin dependent type 2 diabetes mellitus (HCC) 06/12/2007   Past Medical History:  Past Medical History:  Diagnosis Date   Anxiety and depression 06/26/2013   Cellulitis    Chronic kidney disease, stage 3a (HCC) 01/11/2023   Diabetes mellitus    Hyperlipidemia associated with type 2 diabetes mellitus (HCC) 05/02/2013   Hypertension associated with diabetes (HCC) 12/27/2020   Hypothyroidism    Infective endocarditis    PE (pulmonary embolism) ~2007-2008   Not on anticoagulation   Past Surgical History:  Past Surgical History:  Procedure Laterality Date   Abscess removal from L groin     APPLICATION OF WOUND VAC Left 05/01/2013   Procedure: APPLICATION OF WOUND VAC;  Surgeon: Kerrin Champagne, MD;  Location: [facility];  Service: Orthopedics;  Laterality: Left;   BELOW KNEE LEG AMPUTATION Right    CYSTOSCOPY W/ URETERAL STENT PLACEMENT Right 09/03/2021   Procedure: CYSTOSCOPY WITH RETROGRADE PYELOGRAM/URETERAL STENT PLACEMENT;  Surgeon: Marcine Matar, MD;  Location: [facility];  Service: Urology;  Laterality: Right;   I & D EXTREMITY  11/12/2011   Procedure: IRRIGATION AND DEBRIDEMENT EXTREMITY;  Surgeon: Kathryne Hitch, MD;  Location: [facility];  Service: Orthopedics;  Laterality: Left;  foot left   I & D EXTREMITY Left 09/06/2012   Procedure: IRRIGATION AND DEBRIDEMENT EXTREMITY;  Surgeon: Eldred Manges, MD;  Location: [facility];  Service: Orthopedics;  Laterality: Left;   I & D EXTREMITY Left  05/01/2013   Procedure: INCISION AND DRAINAGE LEFT FOREFOOT ABCESS ;  Surgeon: Kerrin Champagne, MD;  Location: [facility];  Service: Orthopedics;  Laterality: Left;   INCISION AND DRAINAGE ABSCESS Left 12/27/2020   Procedure: INCISION AND DRAINAGE LEFT THIGH ABSCESS;  Surgeon: Berna Bue, MD;  Location: [facility];  Service: General;  Laterality: Left;   IR FLUORO GUIDE CV LINE RIGHT  09/12/2021   IR PTA VENOUS EXCEPT DIALYSIS CIRCUIT  01/04/2021   IR RADIOLOGIST EVAL & MGMT  10/25/2021   IR US GUIDE VASC ACCESS RIGHT  09/12/2021   IR VENO/EXT/UNI RIGHT  01/04/2021   Surgery to remove hematoma in L leg     TEE WITHOUT CARDIOVERSION N/A 01/03/2021   Procedure: TRANSESOPHAGEAL ECHOCARDIOGRAM (TEE);  Surgeon: Quintella Reichert, MD;  Location: [facility] END[facility];  Service: Cardiovascular;  Laterality: N/A;   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   HPI:  Karen Bennett is a 47 yo female presenting to ED 8/28 with acute onset aphasia. MRI Brain with small focus of acute ischemia within medial inferior R cerebellar hemisphere without hemorrhage or mass effect. PMH includes CKD stage IIIa, T2DM, HLD, HTN, hypothyroidism, PE on Xarelto, anxiety, depression, morbid obesity, R BKA, lumbar radiculopathy, infective endocarditis   Assessment / Plan / Recommendation Clinical Impression  Pt reports living alone with a PCA who provides asssitance 5x/week. She states that she independently manages all finances and medications. Pt reports she initially experienced aphasia that has since resolved completely. No noted deficits with speech or language during evaluation. Pt scored a 23/30 on the SLUMS (a score of 27 or above is considered WFL) characterized primarily by deficits related to problem solving. Pt had increased difficulty with simple calculations and with the clock drawing task. Recommend continued SLP f/u on an OP basis to ensure return to PLOF and independence. No further f/u needed acutely.    SLP Assessment  SLP  Recommendation/Assessment: All further Speech Lanaguage Pathology  needs can be addressed in the next venue of care SLP Visit Diagnosis: Cognitive communication deficit (R41.841)    Recommendations for follow up therapy are one component of a multi-disciplinary discharge planning process, led by the attending physician.  Recommendations may be updated based on patient status, additional functional criteria and insurance authorization.    Follow Up Recommendations  Outpatient SLP    Assistance Recommended at Discharge  PRN  Functional Status Assessment Patient has had a recent decline in their functional status and demonstrates the ability to make significant improvements in function in a reasonable and predictable amount of time.  Frequency and Duration           SLP Evaluation Cognition  Overall Cognitive Status: No family/caregiver present to determine baseline cognitive functioning Arousal/Alertness: Awake/alert Orientation Level: Oriented X4 Attention: Sustained Sustained Attention: Appears intact Memory: Appears intact Awareness: Appears intact Problem Solving: Impaired Problem Solving Impairment: Verbal basic;Functional basic       Comprehension  Auditory Comprehension Overall Auditory Comprehension: Appears within functional limits for tasks assessed    Expression Expression Primary Mode of Expression: Verbal Verbal Expression Overall Verbal Expression: Appears within functional limits for tasks assessed Written Expression Dominant Hand: Left  Oral / Motor  Oral Motor/Sensory Function Overall Oral Motor/Sensory Function: Within functional limits Motor Speech Overall Motor Speech: Appears within functional limits for tasks assessed            Gwynneth Aliment, M.A., CF-SLP Speech Language Pathology, Acute Rehabilitation Services  Secure Chat preferred [phone]  03/22/2023, 4:16 PM

## 2023-03-22 NOTE — Evaluation (Signed)
Occupational Therapy Evaluation Patient Details Name: Karen Bennett MRN: [id number] DOB: 1976/06/14 Today's Date: 03/22/2023   History of Present Illness Pt is a 47 y.o. female BIB EMS for code stroke due to acute aphasia. PMH significant for DM2, HTN, HLD, hypothyroidism, CKD IIIa, obesity, Rt BKA, PE on Xarelto, DVT, endocarditis, cellulitis, lumbar radiculopathy, anxiety, depression. CT head negative for acute intracranial process.  CTA head and neck negative for LVO but showing advanced atherosclerosis in the head and neck without flow-limiting proximal intracranial stenosis. Also showing 40% right ICA origin stenosis and severe left external carotid artery origin stenosis. MRI of brain shows small focus of acute ischemia within the medial inferior right  cerebellar hemisphere. No hemorrhage or mass effect.   Clinical Impression   Patient admitted for the diagnosis above.  PTA she lives alone in a ground level apartment, has a PCA Monday-Friday for 5 hours/day to assist with ADL/iADL.  Patient uses public transportation for appointments.  Patient primary complaint is continued nausea.  Given level of assist at home, no post acute OT is anticipated.  PT is working on transfers in the acute setting.  BEFAST reviewed with good teach back.  No further OT needs in the acute setting.        If plan is discharge home, recommend the following: Assist for transportation;Assistance with cooking/housework    Functional Status Assessment  Patient has had a recent decline in their functional status and demonstrates the ability to make significant improvements in function in a reasonable and predictable amount of time.  Equipment Recommendations  None recommended by OT    Recommendations for Other Services       Precautions / Restrictions Precautions Precautions: Fall Restrictions Weight Bearing Restrictions: No      Mobility Bed Mobility Overal bed mobility: Needs Assistance Bed Mobility:  Supine to Sit, Sit to Supine     Supine to sit: Supervision Sit to supine: Supervision     Patient Response: Cooperative  Transfers                   General transfer comment: continues with nausea      Balance     Sitting balance-Leahy Scale: Good                                     ADL either performed or assessed with clinical judgement   ADL Overall ADL's : At baseline                                       General ADL Comments: generalized setup with supervision     Vision Patient Visual Report: No change from baseline       Perception Perception: Within Functional Limits       Praxis Praxis: WFL       Pertinent Vitals/Pain Pain Assessment Pain Assessment: No/denies pain Pain Intervention(s): Monitored during session     Extremity/Trunk Assessment Upper Extremity Assessment Upper Extremity Assessment: Overall WFL for tasks assessed   Lower Extremity Assessment Lower Extremity Assessment: Defer to PT evaluation RLE Deficits / Details: hx of Rt BKA, skin intact LLE Deficits / Details: grossly 3-/5 or less, good quad activation, limited DF/PF strength. testing limited by habitus   Cervical / Trunk Assessment Cervical / Trunk Assessment: Other exceptions Cervical / Trunk Exceptions:  habitus   Communication Communication Communication: No apparent difficulties   Cognition Arousal: Alert Behavior During Therapy: WFL for tasks assessed/performed Overall Cognitive Status: Within Functional Limits for tasks assessed                                       General Comments   VSS on RA    Exercises     Shoulder Instructions      Home Living Family/patient expects to be discharged to:: Private residence Living Arrangements: Alone Available Help at Discharge: Personal care attendant Type of Home: Apartment Home Access: Level entry     Home Layout: One level     Bathroom Shower/Tub:  Producer, television/film/video: Standard     Home Equipment: Wheelchair - power;BSC/3in1;Hand held shower head;Grab bars - tub/shower   Additional Comments: has a HH aide - helps with cleaning home and laundry and all homemaking, meal preps. Aide is there M-F for about 5-6 hours. On weekends pt manages on her own. sleeps in regular bed. uses PWC for mobilit in and out of home and medicaid transport for appointments.      Prior Functioning/Environment Prior Level of Function : Independent/Modified Independent             Mobility Comments: power chair for mobility, completes AP scoot to power chair otherwise bedbound ADLs Comments: aid helps with cooking and cleaning tasks IADLs, otherwise Mod Ind with ADLs        OT Problem List: Decreased strength      OT Treatment/Interventions:      OT Goals(Current goals can be found in the care plan section) Acute Rehab OT Goals Patient Stated Goal: Return home OT Goal Formulation: With patient Time For Goal Achievement: 03/30/23 Potential to Achieve Goals: Good  OT Frequency:      Co-evaluation              AM-PAC OT "6 Clicks" Daily Activity     Outcome Measure Help from another person eating meals?: None Help from another person taking care of personal grooming?: None Help from another person toileting, which includes using toliet, bedpan, or urinal?: A Little Help from another person bathing (including washing, rinsing, drying)?: A Little Help from another person to put on and taking off regular upper body clothing?: None Help from another person to put on and taking off regular lower body clothing?: A Little 6 Click Score: 21   End of Session Nurse Communication: Mobility status  Activity Tolerance: Patient tolerated treatment well Patient left: in bed  OT Visit Diagnosis: Muscle weakness (generalized) (M62.81)                Time: 1500-1520 OT Time Calculation (min): 20 min Charges:  OT General Charges $OT  Visit: 1 Visit OT Evaluation $OT Eval Moderate Complexity: 1 Mod  03/22/2023  RP, OTR/L  Acute Rehabilitation Services  Office:  [phone]   Suzanna Obey 03/22/2023, 3:27 PM

## 2023-03-22 NOTE — ED Notes (Signed)
ED TO INPATIENT HANDOFF REPORT  ED Nurse Name and Phone #: Delfin Edis / [phone]  S Name/Age/Gender Karen Bennett 47 y.o. female Room/Bed: 022C/022C  Code Status   Code Status: Full Code  Home/SNF/Other Home Patient oriented to: self, place, time, and situation Is this baseline? Yes   Triage Complete: Triage complete  Chief Complaint Aphasia [R47.01]  Triage Note No notes on file   Allergies Allergies  Allergen Reactions   Clindamycin Diarrhea and Nausea And Vomiting   Zofran Itching and Nausea And Vomiting   Doxycycline Diarrhea and Nausea And Vomiting   Morphine And Codeine Hives, Itching and Rash    Level of Care/Admitting Diagnosis ED Disposition     ED Disposition  Admit   Condition  --   Comment  Hospital Area: [facility] [[id number]]  Level of Care: Telemetry Medical [104]  May place patient in observation at [facility] or [facility] if equivalent level of care is available:: Yes  Covid Evaluation: Asymptomatic - no recent exposure (last 10 days) testing not required  Diagnosis: Aphasia [784.3.ICD-9-CM]  Admitting Physician: John Giovanni [[id number]]  Attending Physician: John Giovanni [[id number]]          B Medical/Surgery History Past Medical History:  Diagnosis Date   Anxiety and depression 06/26/2013   Cellulitis    Chronic kidney disease, stage 3a (HCC) 01/11/2023   Diabetes mellitus    Hyperlipidemia associated with type 2 diabetes mellitus (HCC) 05/02/2013   Hypertension associated with diabetes (HCC) 12/27/2020   Hypothyroidism    Infective endocarditis    PE (pulmonary embolism) ~2007-2008   Not on anticoagulation   Past Surgical History:  Procedure Laterality Date   Abscess removal from L groin     APPLICATION OF WOUND VAC Left 05/01/2013   Procedure: APPLICATION OF WOUND VAC;  Surgeon: Kerrin Champagne, MD;  Location: [facility];  Service: Orthopedics;  Laterality: Left;   BELOW KNEE LEG AMPUTATION  Right    CYSTOSCOPY W/ URETERAL STENT PLACEMENT Right 09/03/2021   Procedure: CYSTOSCOPY WITH RETROGRADE PYELOGRAM/URETERAL STENT PLACEMENT;  Surgeon: Marcine Matar, MD;  Location: [facility];  Service: Urology;  Laterality: Right;   I & D EXTREMITY  11/12/2011   Procedure: IRRIGATION AND DEBRIDEMENT EXTREMITY;  Surgeon: Kathryne Hitch, MD;  Location: [facility];  Service: Orthopedics;  Laterality: Left;  foot left   I & D EXTREMITY Left 09/06/2012   Procedure: IRRIGATION AND DEBRIDEMENT EXTREMITY;  Surgeon: Eldred Manges, MD;  Location: [facility];  Service: Orthopedics;  Laterality: Left;   I & D EXTREMITY Left 05/01/2013   Procedure: INCISION AND DRAINAGE LEFT FOREFOOT ABCESS ;  Surgeon: Kerrin Champagne, MD;  Location: [facility];  Service: Orthopedics;  Laterality: Left;   INCISION AND DRAINAGE ABSCESS Left 12/27/2020   Procedure: INCISION AND DRAINAGE LEFT THIGH ABSCESS;  Surgeon: Berna Bue, MD;  Location: [facility];  Service: General;  Laterality: Left;   IR FLUORO GUIDE CV LINE RIGHT  09/12/2021   IR PTA VENOUS EXCEPT DIALYSIS CIRCUIT  01/04/2021   IR RADIOLOGIST EVAL & MGMT  10/25/2021   IR US GUIDE VASC ACCESS RIGHT  09/12/2021   IR VENO/EXT/UNI RIGHT  01/04/2021   Surgery to remove hematoma in L leg     TEE WITHOUT CARDIOVERSION N/A 01/03/2021   Procedure: TRANSESOPHAGEAL ECHOCARDIOGRAM (TEE);  Surgeon: Quintella Reichert, MD;  Location: [facility] ENDOSCOPY;  Service: Cardiovascular;  Laterality: N/A;   TOE AMPUTATION     last 2 on  L foot     A IV Location/Drains/Wounds Patient Lines/Drains/Airways Status     Active Line/Drains/Airways     Name Placement date Placement time Site Days   Peripheral IV 03/21/23 20 G Right Antecubital 03/21/23  2019  Antecubital  1   Ureteral Drain/Stent Right ureter 6 Fr. 09/03/21  1743  Right ureter  565            Intake/Output Last 24 hours No intake or output data in the 24 hours ending 03/22/23 8295  Labs/Imaging Results for orders placed  or performed during the hospital encounter of 03/21/23 (from the past 48 hour(s))  CBG monitoring, ED     Status: Abnormal   Collection Time: 03/21/23  5:42 PM  Result Value Ref Range   Glucose-Capillary 273 (H) 70 - 99 mg/dL    Comment: Glucose reference range applies only to samples taken after fasting for at least 8 hours.  I-stat chem 8, ED     Status: Abnormal   Collection Time: 03/21/23  5:49 PM  Result Value Ref Range   Sodium 133 (L) 135 - 145 mmol/L   Potassium 3.5 3.5 - 5.1 mmol/L   Chloride 103 98 - 111 mmol/L   BUN 15 6 - 20 mg/dL   Creatinine, Ser 6.21 (H) 0.44 - 1.00 mg/dL   Glucose, Bld 308 (H) 70 - 99 mg/dL    Comment: Glucose reference range applies only to samples taken after fasting for at least 8 hours.   Calcium, Ion 1.09 (L) 1.15 - 1.40 mmol/L   TCO2 19 (L) 22 - 32 mmol/L   Hemoglobin 14.3 12.0 - 15.0 g/dL   HCT 65.7 84.6 - 96.2 %  Ethanol     Status: None   Collection Time: 03/21/23  5:50 PM  Result Value Ref Range   Alcohol, Ethyl (B) <10 <10 mg/dL    Comment: (NOTE) Lowest detectable limit for serum alcohol is 10 mg/dL.  For medical purposes only. Performed at [facility], [address], [place], [place] [address]   Protime-INR     Status: Abnormal   Collection Time: 03/21/23  5:50 PM  Result Value Ref Range   Prothrombin Time 19.3 (H) 11.4 - 15.2 seconds   INR 1.6 (H) 0.8 - 1.2    Comment: (NOTE) INR goal varies based on device and disease states. Performed at [facility], [address] [place], [place] [address]   APTT     Status: Abnormal   Collection Time: 03/21/23  5:50 PM  Result Value Ref Range   aPTT 39 (H) 24 - 36 seconds    Comment:        IF BASELINE aPTT IS ELEVATED, SUGGEST PATIENT RISK ASSESSMENT BE USED TO DETERMINE APPROPRIATE ANTICOAGULANT THERAPY. Performed at [facility], [address] [place], [place] [address]   CBC     Status: Abnormal   Collection Time: 03/21/23  5:50 PM  Result Value Ref  Range   WBC 7.2 4.0 - 10.5 K/uL   RBC 5.15 (H) 3.87 - 5.11 MIL/uL   Hemoglobin 14.1 12.0 - 15.0 g/dL   HCT 25.3 66.4 - 40.3 %   MCV 83.3 80.0 - 100.0 fL   MCH 27.4 26.0 - 34.0 pg   MCHC 32.9 30.0 - 36.0 g/dL   RDW 47.4 25.9 - 56.3 %   Platelets 277 150 - 400 K/uL   nRBC 0.0 0.0 - 0.2 %    Comment: Performed at [facility], [address], [place], [place] [address]  Differential     Status: None   Collection Time: 03/21/23  5:50 PM  Result Value Ref Range   Neutrophils Relative % 61 %   Neutro Abs 4.4 1.7 - 7.7 K/uL   Lymphocytes Relative 28 %   Lymphs Abs 2.0 0.7 - 4.0 K/uL   Monocytes Relative 7 %   Monocytes Absolute 0.5 0.1 - 1.0 K/uL   Eosinophils Relative 3 %   Eosinophils Absolute 0.2 0.0 - 0.5 K/uL   Basophils Relative 1 %   Basophils Absolute 0.0 0.0 - 0.1 K/uL   Immature Granulocytes 0 %   Abs Immature Granulocytes 0.03 0.00 - 0.07 K/uL    Comment: Performed at [facility], [address] [place], [place] [address]  Comprehensive metabolic panel     Status: Abnormal   Collection Time: 03/21/23  5:50 PM  Result Value Ref Range   Sodium 130 (L) 135 - 145 mmol/L   Potassium 3.4 (L) 3.5 - 5.1 mmol/L   Chloride 100 98 - 111 mmol/L   CO2 17 (L) 22 - 32 mmol/L   Glucose, Bld 271 (H) 70 - 99 mg/dL    Comment: Glucose reference range applies only to samples taken after fasting for at least 8 hours.   BUN 13 6 - 20 mg/dL   Creatinine, Ser 0.98 (H) 0.44 - 1.00 mg/dL   Calcium 8.6 (L) 8.9 - 10.3 mg/dL   Total Protein 6.4 (L) 6.5 - 8.1 g/dL   Albumin 1.9 (L) 3.5 - 5.0 g/dL   AST 11 (L) 15 - 41 U/L   ALT 11 0 - 44 U/L   Alkaline Phosphatase 65 38 - 126 U/L   Total Bilirubin 1.0 0.3 - 1.2 mg/dL   GFR, Estimated 50 (L) >60 mL/min    Comment: (NOTE) Calculated using the CKD-EPI Creatinine Equation (2021)    Anion gap 13 5 - 15    Comment: Performed at [facility], [address], [place], [place] [address]  CBG monitoring, ED     Status: Abnormal    Collection Time: 03/21/23  9:30 PM  Result Value Ref Range   Glucose-Capillary 290 (H) 70 - 99 mg/dL    Comment: Glucose reference range applies only to samples taken after fasting for at least 8 hours.  HIV Antibody (routine testing w rflx)     Status: None   Collection Time: 03/21/23  9:44 PM  Result Value Ref Range   HIV Screen 4th Generation wRfx Non Reactive Non Reactive    Comment: Performed at [facility], [address], [place], [place] [address]  Beta-hydroxybutyric acid     Status: Abnormal   Collection Time: 03/21/23  9:44 PM  Result Value Ref Range   Beta-Hydroxybutyric Acid 0.33 (H) 0.05 - 0.27 mmol/L    Comment: Performed at [facility], [address] [place], [place] [address]  Osmolality     Status: None   Collection Time: 03/21/23  9:44 PM  Result Value Ref Range   Osmolality 295 275 - 295 mOsm/kg    Comment: Performed at [facility], [address] [place], [place] [address]  Magnesium     Status: Abnormal   Collection Time: 03/21/23  9:44 PM  Result Value Ref Range   Magnesium 1.5 (L) 1.7 - 2.4 mg/dL    Comment: Performed at [facility], [address] [place],  [place] [address]  Lipase, blood     Status: None   Collection Time: 03/21/23  9:44 PM  Result Value Ref Range   Lipase 23 11 - 51 U/L    Comment: Performed at [facility], [address], [place], [place] [address]  CBG monitoring, ED     Status: Abnormal   Collection Time: 03/22/23 12:03 AM  Result Value Ref Range   Glucose-Capillary 229 (H) 70 - 99 mg/dL    Comment: Glucose reference range applies only to samples taken after fasting for at least 8 hours.  CBG monitoring, ED     Status: Abnormal   Collection Time: 03/22/23  3:25 AM  Result Value Ref Range   Glucose-Capillary 168 (H) 70 - 99 mg/dL    Comment: Glucose reference range applies only to samples taken after fasting for at least 8 hours.  Lipid panel     Status: Abnormal   Collection Time: 03/22/23   4:16 AM  Result Value Ref Range   Cholesterol 340 (H) 0 - 200 mg/dL   Triglycerides 563 (H) <150 mg/dL   HDL 35 (L) >87 mg/dL   Total CHOL/HDL Ratio 9.7 RATIO   VLDL 67 (H) 0 - 40 mg/dL   LDL Cholesterol 564 (H) 0 - 99 mg/dL    Comment:        Total Cholesterol/HDL:CHD Risk Coronary Heart Disease Risk Table                     Men   Women  1/2 Average Risk   3.4   3.3  Average Risk       5.0   4.4  2 X Average Risk   9.6   7.1  3 X Average Risk  23.4   11.0        Use the calculated Patient Ratio above and the CHD Risk Table to determine the patient's CHD Risk.        ATP III CLASSIFICATION (LDL):  <100     mg/dL   Optimal  332-951  mg/dL   Near or Above                    Optimal  130-159  mg/dL   Borderline  884-166  mg/dL   High  >063     mg/dL   Very High Performed at [facility], [address] [place], [place] [address]   Basic metabolic panel     Status: Abnormal   Collection Time: 03/22/23  4:16 AM  Result Value Ref Range   Sodium 132 (L) 135 - 145 mmol/L   Potassium 3.5 3.5 - 5.1 mmol/L   Chloride 104 98 - 111 mmol/L   CO2 22 22 - 32 mmol/L   Glucose, Bld 146 (H) 70 - 99 mg/dL    Comment: Glucose reference range applies only to samples taken after fasting for at least 8 hours.   BUN 14 6 - 20 mg/dL   Creatinine, Ser 0.93 (H) 0.44 - 1.00 mg/dL   Calcium 8.4 (L) 8.9 - 10.3 mg/dL   GFR, Estimated 37 (L) >60 mL/min    Comment: (NOTE) Calculated using the CKD-EPI Creatinine Equation (2021)    Anion gap 6 5 - 15    Comment: Performed at [facility], [address] [place], [place] [address]  Urine rapid drug screen (hosp performed)     Status: Abnormal   Collection Time: 03/22/23  4:26  AM  Result Value Ref Range   Opiates NONE DETECTED NONE DETECTED   Cocaine NONE DETECTED NONE DETECTED   Benzodiazepines POSITIVE (A) NONE DETECTED   Amphetamines NONE DETECTED NONE DETECTED   Tetrahydrocannabinol POSITIVE (A) NONE DETECTED   Barbiturates NONE  DETECTED NONE DETECTED    Comment: (NOTE) DRUG SCREEN FOR MEDICAL PURPOSES ONLY.  IF CONFIRMATION IS NEEDED FOR ANY PURPOSE, NOTIFY LAB WITHIN 5 DAYS.  LOWEST DETECTABLE LIMITS FOR URINE DRUG SCREEN Drug Class                     Cutoff (ng/mL) Amphetamine and metabolites    1000 Barbiturate and metabolites    200 Benzodiazepine                 200 Opiates and metabolites        300 Cocaine and metabolites        300 THC                            50 Performed at [facility], [address], [place], [place] [address]   Urinalysis, Routine w reflex microscopic -Urine, Clean Catch     Status: Abnormal   Collection Time: 03/22/23  4:26 AM  Result Value Ref Range   Color, Urine AMBER (A) YELLOW    Comment: BIOCHEMICALS MAY BE AFFECTED BY COLOR   APPearance TURBID (A) CLEAR   Specific Gravity, Urine 1.040 (H) 1.005 - 1.030   pH 5.0 5.0 - 8.0   Glucose, UA >=500 (A) NEGATIVE mg/dL   Hgb urine dipstick LARGE (A) NEGATIVE   Bilirubin Urine NEGATIVE NEGATIVE   Ketones, ur NEGATIVE NEGATIVE mg/dL   Protein, ur >=784 (A) NEGATIVE mg/dL   Nitrite POSITIVE (A) NEGATIVE   Leukocytes,Ua MODERATE (A) NEGATIVE   RBC / HPF >50 0 - 5 RBC/hpf   WBC, UA >50 0 - 5 WBC/hpf   Bacteria, UA MANY (A) NONE SEEN   Squamous Epithelial / HPF 6-10 0 - 5 /HPF   WBC Clumps PRESENT     Comment: Performed at [facility], [address], [place], [place] [address]   MR Brain Wo Contrast (neuro protocol)  Result Date: 03/21/2023 CLINICAL DATA:  Aphasia EXAM: MRI HEAD WITHOUT CONTRAST TECHNIQUE: Multiplanar, multiecho pulse sequences of the brain and surrounding structures were obtained without intravenous contrast. COMPARISON:  None Available. FINDINGS: Brain: There is a small focus of abnormal diffusion restriction within the medial inferior right cerebellar hemisphere. No acute or chronic hemorrhage. There is multifocal hyperintense T2-weighted signal within the white matter. Parenchymal volume  and CSF spaces are normal. The midline structures are normal. Vascular: Major flow voids are preserved. Skull and upper cervical spine: Normal calvarium and skull base. Visualized upper cervical spine and soft tissues are normal. Sinuses/Orbits:No paranasal sinus fluid levels or advanced mucosal thickening. No mastoid or middle ear effusion. Normal orbits. IMPRESSION: 1. Small focus of acute ischemia within the medial inferior right cerebellar hemisphere. No hemorrhage or mass effect. 2. Multifocal hyperintense T2-weighted signal within the white matter, nonspecific but most commonly seen in the setting of chronic microvascular ischemia. Electronically Signed   By: Deatra Robinson M.D.   On: 03/21/2023 23:29   CT ANGIO HEAD NECK W WO CM (CODE STROKE)  Result Date: 03/21/2023 CLINICAL DATA:  Neuro deficit, acute, stroke suspected.  Aphasia EXAM: CT ANGIOGRAPHY HEAD AND NECK WITH AND WITHOUT CONTRAST TECHNIQUE: Multidetector  CT imaging of the head and neck was performed using the standard protocol during bolus administration of intravenous contrast. Multiplanar CT image reconstructions and MIPs were obtained to evaluate the vascular anatomy. Carotid stenosis measurements (when applicable) are obtained utilizing NASCET criteria, using the distal internal carotid diameter as the denominator. RADIATION DOSE REDUCTION: This exam was performed according to the departmental dose-optimization program which includes automated exposure control, adjustment of the mA and/or kV according to patient size and/or use of iterative reconstruction technique. CONTRAST:  OMNIPAQUE IOHEXOL 350 MG/ML SOLN COMPARISON:  None Available. FINDINGS: CTA NECK FINDINGS Aortic arch: Standard 3 vessel aortic arch with mild atherosclerotic calcification. No significant stenosis of the arch vessel origins. Right carotid system: Patent with a moderately large amount of predominantly calcified plaque about the carotid bifurcation resulting in  40% stenosis of the ICA origin. Left carotid system: Patent with a moderately large amount of predominantly calcified plaque about the carotid bifurcation resulting in severe stenosis of the proximal external carotid artery but no significant stenosis of the common or internal carotid arteries. Vertebral arteries: Patent without evidence of significant stenosis or dissection. Strongly dominant left vertebral artery. Skeleton: Mild cervical spondylosis. Other neck: No evidence of cervical lymphadenopathy or mass. Upper chest: Partially visualized small pleural effusions, right larger than left. Nonspecific mosaic attenuation in the lungs bilaterally. Coronary atherosclerosis. Review of the MIP images confirms the above findings CTA HEAD FINDINGS Anterior circulation: The internal carotid arteries are patent from skull base to carotid termini with mild-to-moderate atherosclerotic calcification bilaterally not resulting in significant stenosis. ACAs and MCAs are patent without evidence of a proximal branch occlusion or significant proximal stenosis. No aneurysm is identified. Posterior circulation: The intracranial vertebral arteries are patent with the left supplying the basilar in the right terminating in PICA. Left V4 segment atherosclerosis results in mild stenosis. Patent PICA and SCA origins are visualized bilaterally. The basilar artery is widely patent. Posterior communicating arteries are diminutive or absent. The PCAs are patent with asymmetric right-sided branch vessel irregularity but no evidence of a flow limiting proximal stenosis. No aneurysm is identified. Venous sinuses: As permitted by contrast timing, patent. Anatomic variants: Hypoplastic right vertebral artery terminating in PICA. Review of the MIP images confirms the above findings IMPRESSION: 1. No large vessel occlusion. 2. Age advanced atherosclerosis in the head and neck. No flow limiting proximal intracranial stenosis. 3. 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 4. Severe left external carotid artery origin stenosis. 5. Small pleural effusions. 6.  Aortic Atherosclerosis (ICD10-I70.0). Electronically Signed   By: Sebastian Ache M.D.   On: 03/21/2023 18:33   CT HEAD CODE STROKE WO CONTRAST  Result Date: 03/21/2023 CLINICAL DATA:  Code stroke. Neuro deficit, acute, stroke suspected. Aphasia. EXAM: CT HEAD WITHOUT CONTRAST TECHNIQUE: Contiguous axial images were obtained from the base of the skull through the vertex without intravenous contrast. RADIATION DOSE REDUCTION: This exam was performed according to the departmental dose-optimization program which includes automated exposure control, adjustment of the mA and/or kV according to patient size and/or use of iterative reconstruction technique. COMPARISON:  None Available. FINDINGS: Brain: Small hypodensities in the white matter adjacent to the frontal horn of the right lateral ventricle are nonspecific but most resemble small-vessel ischemia, including a discrete, chronic appearing lacunar infarct anteriorly. The ventricles are normal in size. Vascular: Age advanced atherosclerosis at the skull base. Skull: No acute fracture or suspicious osseous lesion. Sinuses/Orbits: Visualized paranasal sinuses and mastoid air cells are clear. Unremarkable orbits. Other: None. ASPECTS [facility]  Stroke Program Early CT Score) - Ganglionic level infarction (caudate, lentiform nuclei, internal capsule, insula, M1-M3 cortex): 7 - Supraganglionic infarction (M4-M6 cortex): 3 Total score (0-10 with 10 being normal): 10 These results were communicated to Dr. Derry Lory at 6:07 pm on 03/21/2023 by text page via the [facility] messaging system. IMPRESSION: 1. No evidence of acute cortical infarct or intracranial hemorrhage. ASPECTS of 10. 2. Chronic small-vessel type changes in the right frontal periventricular white matter. 3. Age advanced atherosclerosis. Electronically Signed   By: Sebastian Ache M.D.   On: 03/21/2023 18:08    Pending  Labs Unresulted Labs (From admission, onward)    None       Vitals/Pain Today's Vitals   03/22/23 0500 03/22/23 0600 03/22/23 0628 03/22/23 0653  BP: (!) 159/87 (!) 141/72    Pulse: 79 80    Resp: 12 17    Temp:   98.8 F (37.1 C)   TempSrc:   Oral   SpO2: 99% 98%    Weight:      Height:      PainSc:    8     Isolation Precautions No active isolations  Medications Medications   stroke: early stages of recovery book (has no administration in time range)  acetaminophen (TYLENOL) tablet 650 mg (650 mg Oral Given 03/22/23 0603)    Or  acetaminophen (TYLENOL) 160 MG/5ML solution 650 mg ( Per Tube See Alternative 03/22/23 0603)    Or  acetaminophen (TYLENOL) suppository 650 mg ( Rectal See Alternative 03/22/23 0603)  insulin aspart (novoLOG) injection 0-9 Units (2 Units Subcutaneous Given 03/22/23 0333)  iohexol (OMNIPAQUE) 350 MG/ML injection 150 mL (150 mLs Intravenous Contrast Given 03/21/23 1807)  fentaNYL (SUBLIMAZE) injection 50 mcg (50 mcg Intravenous Given 03/21/23 1845)  LORazepam (ATIVAN) injection 1 mg (1 mg Intravenous Given 03/21/23 2019)  potassium chloride 10 mEq in 100 mL IVPB (0 mEq Intravenous Stopped 03/22/23 0046)  magnesium sulfate IVPB 2 g 50 mL (0 g Intravenous Stopped 03/22/23 0709)    Mobility non-ambulatory     Focused Assessments Neuro Assessment Handoff:  Swallow screen pass? Yes    NIH Stroke Scale  Dizziness Present: Yes Headache Present: Yes Interval: Shift assessment Level of Consciousness (1a.)   : Alert, keenly responsive LOC Questions (1b. )   : Answers both questions correctly LOC Commands (1c. )   : Performs both tasks correctly Best Gaze (2. )  : Normal Visual (3. )  : No visual loss Facial Palsy (4. )    : Normal symmetrical movements Motor Arm, Left (5a. )   : No drift Motor Arm, Right (5b. ) : No drift Motor Leg, Left (6a. )  : No drift Motor Leg, Right (6b. ) : Amputation or joint fusion Limb Ataxia (7. ): Present in one  limb Sensory (8. )  : Normal, no sensory loss Best Language (9. )  : No aphasia Dysarthria (10. ): Normal Extinction/Inattention (11.)   : No Abnormality Complete NIHSS TOTAL: 1 Last date known well: 03/21/23 Last time known well: 1610 Neuro Assessment: Within Defined Limits Neuro Checks:   Initial (03/21/23 1800)  Has TPA been given? No If patient is a Neuro Trauma and patient is going to OR before floor call report to 4N Charge nurse: [phone] or [phone]   R Recommendations: See Admitting Provider Note  Report given to:   Additional Notes:

## 2023-03-22 NOTE — ED Notes (Signed)
MD to put order for patient's pain medication.

## 2023-03-22 NOTE — H&P (View-Only) (Signed)
 STROKE TEAM PROGRESS NOTE   BRIEF HPI Ms. Karen Bennett is a 47 y.o. female with history of  PE on xarelto, anxiety and depression, morbid obesity, DM, HTN, HLD, HF, CKD, Right BKA, hypothyroidism, infective endocarditis who presents to [facility] ED via EMS for aphasia.   SIGNIFICANT HOSPITAL EVENTS 8/28- Admitted for stroke workup 8/30- Plan for TEE  INTERIM HISTORY/SUBJECTIVE Will likely need TEE and TCD bubble study as well as continued anticoagulation.  States she takes  her xarelto with a cup of water- does not eat breakfast. Recommend changing to eliquis as she does not eat much.   OBJECTIVE  CBC    Component Value Date/Time   WBC 7.2 03/21/2023 1750   RBC 5.15 (H) 03/21/2023 1750   HGB 14.1 03/21/2023 1750   HCT 42.9 03/21/2023 1750   PLT 277 03/21/2023 1750   MCV 83.3 03/21/2023 1750   MCH 27.4 03/21/2023 1750   MCHC 32.9 03/21/2023 1750   RDW 13.2 03/21/2023 1750   LYMPHSABS 2.0 03/21/2023 1750   MONOABS 0.5 03/21/2023 1750   EOSABS 0.2 03/21/2023 1750   BASOSABS 0.0 03/21/2023 1750    BMET    Component Value Date/Time   NA 132 (L) 03/22/2023 0416   K 3.5 03/22/2023 0416   CL 104 03/22/2023 0416   CO2 22 03/22/2023 0416   GLUCOSE 146 (H) 03/22/2023 0416   BUN 14 03/22/2023 0416   CREATININE 1.71 (H) 03/22/2023 0416   CREATININE 0.70 09/01/2013 0954   CALCIUM 8.4 (L) 03/22/2023 0416   GFRNONAA 37 (L) 03/22/2023 0416   GFRNONAA >89 09/01/2013 0954    IMAGING past 24 hours MR Brain Wo Contrast (neuro protocol)  Result Date: 03/21/2023 CLINICAL DATA:  Aphasia EXAM: MRI HEAD WITHOUT CONTRAST TECHNIQUE: Multiplanar, multiecho pulse sequences of the brain and surrounding structures were obtained without intravenous contrast. COMPARISON:  None Available. FINDINGS: Brain: There is a small focus of abnormal diffusion restriction within the medial inferior right cerebellar hemisphere. No acute or chronic hemorrhage. There is multifocal hyperintense T2-weighted signal  within the white matter. Parenchymal volume and CSF spaces are normal. The midline structures are normal. Vascular: Major flow voids are preserved. Skull and upper cervical spine: Normal calvarium and skull base. Visualized upper cervical spine and soft tissues are normal. Sinuses/Orbits:No paranasal sinus fluid levels or advanced mucosal thickening. No mastoid or middle ear effusion. Normal orbits. IMPRESSION: 1. Small focus of acute ischemia within the medial inferior right cerebellar hemisphere. No hemorrhage or mass effect. 2. Multifocal hyperintense T2-weighted signal within the white matter, nonspecific but most commonly seen in the setting of chronic microvascular ischemia. Electronically Signed   By: Deatra Robinson M.D.   On: 03/21/2023 23:29   CT ANGIO HEAD NECK W WO CM (CODE STROKE)  Result Date: 03/21/2023 CLINICAL DATA:  Neuro deficit, acute, stroke suspected.  Aphasia EXAM: CT ANGIOGRAPHY HEAD AND NECK WITH AND WITHOUT CONTRAST TECHNIQUE: Multidetector CT imaging of the head and neck was performed using the standard protocol during bolus administration of intravenous contrast. Multiplanar CT image reconstructions and MIPs were obtained to evaluate the vascular anatomy. Carotid stenosis measurements (when applicable) are obtained utilizing NASCET criteria, using the distal internal carotid diameter as the denominator. RADIATION DOSE REDUCTION: This exam was performed according to the departmental dose-optimization program which includes automated exposure control, adjustment of the mA and/or kV according to patient size and/or use of iterative reconstruction technique. CONTRAST:  OMNIPAQUE IOHEXOL 350 MG/ML SOLN COMPARISON:  None Available. FINDINGS: CTA NECK  FINDINGS Aortic arch: Standard 3 vessel aortic arch with mild atherosclerotic calcification. No significant stenosis of the arch vessel origins. Right carotid system: Patent with a moderately large amount of predominantly calcified plaque  about the carotid bifurcation resulting in 40% stenosis of the ICA origin. Left carotid system: Patent with a moderately large amount of predominantly calcified plaque about the carotid bifurcation resulting in severe stenosis of the proximal external carotid artery but no significant stenosis of the common or internal carotid arteries. Vertebral arteries: Patent without evidence of significant stenosis or dissection. Strongly dominant left vertebral artery. Skeleton: Mild cervical spondylosis. Other neck: No evidence of cervical lymphadenopathy or mass. Upper chest: Partially visualized small pleural effusions, right larger than left. Nonspecific mosaic attenuation in the lungs bilaterally. Coronary atherosclerosis. Review of the MIP images confirms the above findings CTA HEAD FINDINGS Anterior circulation: The internal carotid arteries are patent from skull base to carotid termini with mild-to-moderate atherosclerotic calcification bilaterally not resulting in significant stenosis. ACAs and MCAs are patent without evidence of a proximal branch occlusion or significant proximal stenosis. No aneurysm is identified. Posterior circulation: The intracranial vertebral arteries are patent with the left supplying the basilar in the right terminating in PICA. Left V4 segment atherosclerosis results in mild stenosis. Patent PICA and SCA origins are visualized bilaterally. The basilar artery is widely patent. Posterior communicating arteries are diminutive or absent. The PCAs are patent with asymmetric right-sided branch vessel irregularity but no evidence of a flow limiting proximal stenosis. No aneurysm is identified. Venous sinuses: As permitted by contrast timing, patent. Anatomic variants: Hypoplastic right vertebral artery terminating in PICA. Review of the MIP images confirms the above findings IMPRESSION: 1. No large vessel occlusion. 2. Age advanced atherosclerosis in the head and neck. No flow limiting proximal  intracranial stenosis. 3. 40% right ICA origin stenosis. 4. Severe left external carotid artery origin stenosis. 5. Small pleural effusions. 6.  Aortic Atherosclerosis (ICD10-I70.0). Electronically Signed   By: Sebastian Ache M.D.   On: 03/21/2023 18:33   CT HEAD CODE STROKE WO CONTRAST  Result Date: 03/21/2023 CLINICAL DATA:  Code stroke. Neuro deficit, acute, stroke suspected. Aphasia. EXAM: CT HEAD WITHOUT CONTRAST TECHNIQUE: Contiguous axial images were obtained from the base of the skull through the vertex without intravenous contrast. RADIATION DOSE REDUCTION: This exam was performed according to the departmental dose-optimization program which includes automated exposure control, adjustment of the mA and/or kV according to patient size and/or use of iterative reconstruction technique. COMPARISON:  None Available. FINDINGS: Brain: Small hypodensities in the white matter adjacent to the frontal horn of the right lateral ventricle are nonspecific but most resemble small-vessel ischemia, including a discrete, chronic appearing lacunar infarct anteriorly. The ventricles are normal in size. Vascular: Age advanced atherosclerosis at the skull base. Skull: No acute fracture or suspicious osseous lesion. Sinuses/Orbits: Visualized paranasal sinuses and mastoid air cells are clear. Unremarkable orbits. Other: None. ASPECTS (Alberta Stroke Program Early CT Score) - Ganglionic level infarction (caudate, lentiform nuclei, internal capsule, insula, M1-M3 cortex): 7 - Supraganglionic infarction (M4-M6 cortex): 3 Total score (0-10 with 10 being normal): 10 These results were communicated to Dr. Derry Lory at 6:07 pm on 03/21/2023 by text page via the [facility] messaging system. IMPRESSION: 1. No evidence of acute cortical infarct or intracranial hemorrhage. ASPECTS of 10. 2. Chronic small-vessel type changes in the right frontal periventricular white matter. 3. Age advanced atherosclerosis. Electronically Signed   By: Sebastian Ache M.D.   On: 03/21/2023 18:08  Vitals:   03/22/23 0700 03/22/23 0730 03/22/23 0743 03/22/23 0744  BP: 110/67   110/67  Pulse: (!) 59 73  71  Resp: 17 19  12   Temp:   98.6 F (37 C)   TempSrc:   Oral   SpO2: 94% 97%  99%  Weight:      Height:         PHYSICAL EXAM General:  Alert, well-nourished, well-developed patient in no acute distress Psych:  Mood and affect appropriate for situation CV: Regular rate and rhythm on monitor Respiratory:  Regular, unlabored respirations on room air GI: Abdomen soft and nontender Ext: Right BKA  NEURO:  Mental Status: AA&Ox3, patient is able to give clear and coherent history Speech/Language: speech is without dysarthria or aphasia.  Naming, repetition, fluency, and comprehension intact.  Cranial Nerves:  II: PERRL. Visual fields full.  III, IV, VI: EOMI. Eyelids elevate symmetrically.  V: Sensation is intact to light touch and symmetrical to face.  VII: Face is symmetrical resting and smiling VIII: hearing intact to voice. IX, X: Palate elevates symmetrically. Phonation is normal.  HQ:IONGEXBM shrug 5/5. XII: tongue is midline without fasciculations. Motor: 5/5 strength to all muscle groups tested.  Tone: is normal and bulk is normal Sensation- Intact to light touch bilaterally. Extinction absent to light touch to DSS.   Coordination: FTN intact bilaterally, HKS: no ataxia in BLE.No drift.  Gait- deferred   ASSESSMENT/PLAN  Acute Ischemic Infarct:  Right inferior cerebellar hemisphere -clinically silent Etiology:  likely embolic   Code Stroke CT head No acute abnormality.  CTA head & neck 40% right ICA origin stenosis MRI  Small focus of acute ischemia within the medial inferior right cerebellar hemisphere. No hemorrhage or mass effect. Multifocal hyperintense T2-weighted signal within the white matter, nonspecific but most commonly seen in the setting of chronic microvascular ischemia. TEE 8/30- 1400 TCD Bubble- Positive  for PFO LDL 238 HgbA1c 10.7 VTE prophylaxis - Eliquis Xarelto (rivaroxaban) daily prior to admission, now on Eliquis (apixaban) daily Therapy recommendations:  Pending  Disposition:  Pending   Hx of PE Home Meds: Xarelto  Plan to switch to eliquis   Congestive Heart Failure Hypertension Home meds: Lasix Stable Blood Pressure Goal: BP less than 220/110   Hyperlipidemia Home meds:  Lipitor 40mg , resumed in hospital LDL 238, goal < 70 Continue statin at discharge  Diabetes type II Uncontrolled Home meds:  Insulin HgbA1c 10.7, goal < 7.0 CBGs SSI Recommend close follow-up with PCP for better DM control  Other Stroke Risk Factors ETOH use, alcohol level <10, advised to drink no more than 1 drink(s) a day Obesity, Body mass index is 58.36 kg/m., BMI >/= 30 associated with increased stroke risk, recommend weight loss, diet and exercise as appropriate   Other Active Problems Hypothyroidism Hx infective endocarditis  Hospital day # 0  Patient seen and examined by NP/APP with MD. MD to update note as needed.   Elmer Picker, DNP, FNP-BC [facility] Pager: [phone]  STROKE MD NOTE : I have personally obtained history,examined this patient, reviewed notes, independently viewed imaging studies, participated in medical decision making and plan of care.ROS completed by me personally and pertinent positives fully documented  I have made any additions or clarifications directly to the above note. Agree with note above.  Patient with recent diagnosis of pulm embolism and taking Xarelto presented with aphasia and altered mental status which was transient.  MRI shows no left hemispheric infarct but shows likely clinically silent  right cerebellar embolic infarct.  TCD bubble study strongly positive raising concern for paradoxical embolism.  Recommend switch Xarelto to Eliquis 5 mg twice daily for better compliance and possibly efficacy as well.  TEE to confirm right-to-left  shunt patient may need elective endovascular PFO closure later.  Continue ongoing stroke workup.  Aggressive risk factor modification.  On discussion with patient and answered questions.  Discussed with Dr. Mahala Menghini.  Greater than 50% time during this 50 minute visit was spent on counseling and coordination of care about her silent cerebral stroke and episode of aphasia and discussion about paradoxical embolism and need for changing anticoagulation from Xarelto to Eliquis and answering questions  Delia Heady, MD Medical Director [facility] Pager: [phone] 03/22/2023 3:37 PM  To contact Stroke Continuity provider, please refer to [email or web address]. After hours, contact General Neurology

## 2023-03-22 NOTE — ED Notes (Signed)
In and out cathed patient for UA specimen.

## 2023-03-22 NOTE — Inpatient Diabetes Management (Addendum)
Inpatient Diabetes Program Recommendations  AACE/ADA: New Consensus Statement on Inpatient Glycemic Control (2015)  Target Ranges:  Prepandial:   less than 140 mg/dL      Peak postprandial:   less than 180 mg/dL (1-2 hours)      Critically ill patients:  140 - 180 mg/dL   Lab Results  Component Value Date   GLUCAP 148 (H) 03/22/2023   HGBA1C 10.7 (H) 01/12/2023    Review of Glycemic Control  Latest Reference Range & Units 03/22/23 07:56 03/22/23 11:17  Glucose-Capillary 70 - 99 mg/dL 782 (H) 956 (H)  (H): Data is abnormally high  Diabetes history: DM2 Outpatient Diabetes medications: Semglee 10 units QD Current orders for Inpatient glycemic control: Novolog 0-9 units Q4H  Spoke with patient at bedside.  She was sent to ED from her PCP yesterday for word salad.  Her A1C yesterday at her PCP visit was 8.9% (average BG of 209 mg/dL).  Discussed A1C results with her.  She has taken insulin in the past.  She states "sometimes I do good and sometimes I don't".  She has a fear of leaving her home which is part of the reason she does not get her insulin.  She has been transitioning to water and zero calorie beverages.  A1C in June was >10%.    She has a glucometer at home but does not like to check her BG.  We discussed her trying a CGM.  She is very interested in this.  Will ask MD for permission to place a sample prior to DC and ask pharmacy to start a benefit check.    Encouraged her to administer her insulin as prescribed.  Maybe she could have home delivery?  Discussed long and short term complications of uncontrolled BG.  She verbalizes understanding.    MD-please order the [facility] 3 sensor at discharge-Order # [id number]  Will continue to follow while inpatient.  Thank you, Dulce Sellar, MSN, CDCES Diabetes Coordinator Inpatient Diabetes Program [phone] (team pager from 8a-5p)

## 2023-03-22 NOTE — ED Notes (Signed)
Patient had urinary incontinent episode, changed and cleansed patient at this time. Provided with chux pads. Repositioned patient. Noted pt speech has improved at this time. No aphasia noted at this time.

## 2023-03-22 NOTE — Telephone Encounter (Signed)
Pharmacy Patient Advocate Encounter  Received notification from [facility] that Prior Authorization for FreeStyle Libre 3 Sensor  has been APPROVED from 03/22/2023 to 07/24/2023. Ran test claim, Copay is $0.00. This test claim was processed through [facility]- copay amounts may vary at other pharmacies due to pharmacy/plan contracts, or as the patient moves through the different stages of their insurance plan.   PA #/Case ID/Reference #: [id number]

## 2023-03-22 NOTE — Progress Notes (Signed)
   [facility] has been requested to perform a transesophageal echocardiogram on Karen Bennett for stroke.  After careful review of history and examination, the risks and benefits of transesophageal echocardiogram have been explained including risks of esophageal damage, perforation (1:10,000 risk), bleeding, pharyngeal hematoma as well as other potential complications associated with conscious sedation including aspiration, arrhythmia, respiratory failure and death. Alternatives to treatment were discussed, questions were answered. Patient is willing to proceed.   Procedure is scheduled for 03/23/2023 at 14:00 with Dr. Bjorn Pippin. Will make NPO at midnight and place pre-procedural orders.  Corrin Parker, PA-C 03/22/2023 2:47 PM

## 2023-03-22 NOTE — Evaluation (Signed)
Physical Therapy Evaluation Patient Details Name: Karen Bennett MRN: [id number] DOB: Feb 24, 1976 Today's Date: 03/22/2023  History of Present Illness  Pt is a 47 y.o. female BIB EMS for code stroke due to acute aphasia. PMH significant for DM2, HTN, HLD, hypothyroidism, CKD IIIa, obesity, Rt BKA, PE on Xarelto, DVT, endocarditis, cellulitis, lumbar radiculopathy, anxiety, depression. CT head negative for acute intracranial process.  CTA head and neck negative for LVO but showing advanced atherosclerosis in the head and neck without flow-limiting proximal intracranial stenosis. Also showing 40% right ICA origin stenosis and severe left external carotid artery origin stenosis. MRI of brain shows small focus of acute ischemia within the medial inferior right  cerebellar hemisphere. No hemorrhage or mass effect.   Clinical Impression  Karen Bennett is 47 y.o. female admitted with above HPI and diagnosis. Patient is currently limited by functional impairments below (see PT problem list). Patient lives alone and is mod independent at baseline with use of power WC. Currently pain limited by nausea, backache and head ache and agreeable to bed mobility only ans was able to complete supine<>long sit with supervision only and was able to scoot ant/post on flat bed with use of Ue's on bed rail to weight shift. Patient will benefit from continued skilled PT interventions to address impairments and progress independence with mobility. Acute PT will follow and progress as able.         If plan is discharge home, recommend the following: Two people to help with walking and/or transfers;Two people to help with bathing/dressing/bathroom;Assistance with cooking/housework;Assist for transportation;Help with stairs or ramp for entrance   Can travel by private vehicle        Equipment Recommendations None recommended by PT  Recommendations for Other Services       Functional Status Assessment Patient has had a  recent decline in their functional status and demonstrates the ability to make significant improvements in function in a reasonable and predictable amount of time.     Precautions / Restrictions Precautions Precautions: Fall Restrictions Weight Bearing Restrictions: No      Mobility  Bed Mobility Overal bed mobility: Needs Assistance Bed Mobility: Supine to Sit, Sit to Supine     Supine to sit: Supervision Sit to supine: Supervision   General bed mobility comments: pt able to move supine<>long sitting with use of UE's. pt able to scoot Ant/Post in flattened bed with good use of UE's and shifting hips Rt/Lt.    Transfers                   General transfer comment: declined OOB this visit due to nausea and head/back ache    Ambulation/Gait                  Stairs            Wheelchair Mobility     Tilt Bed    Modified Rankin (Stroke Patients Only)       Balance Overall balance assessment: Needs assistance Sitting-balance support: Feet supported, No upper extremity supported Sitting balance-Leahy Scale: Fair                                       Pertinent Vitals/Pain Pain Assessment Pain Assessment: Faces Faces Pain Scale: Hurts little more Pain Descriptors / Indicators: Aching, Discomfort Pain Intervention(s): Repositioned, Monitored during session, Limited activity within patient's tolerance  Home Living Family/patient expects to be discharged to:: Private residence Living Arrangements: Alone Available Help at Discharge: Personal care attendant Type of Home: Apartment Home Access: Level entry       Home Layout: One level Home Equipment: Wheelchair - power;BSC/3in1;Hand held shower head;Grab bars - tub/shower (drop arm BSC in shower) Additional Comments: has a HH aide - helps with cleaning home and laundry and all homemaking, meal preps. Aide is there M-F for about 5-6 hours. On weekends pt manages on her own.  sleeps in regular bed. uses PWC for mobilit in and out of home and medicaid transport for appointments.    Prior Function               Mobility Comments: power chair for mobility, completes AP scoot to power chair otherwise bedbound ADLs Comments: aid helps with cooking and cleaning tasks IADLs, otherwise Mod Ind with ADLs     Extremity/Trunk Assessment   Upper Extremity Assessment Upper Extremity Assessment: Defer to OT evaluation;Overall WFL for tasks assessed    Lower Extremity Assessment Lower Extremity Assessment: RLE deficits/detail;Generalized weakness;LLE deficits/detail RLE Deficits / Details: hx of Rt BKA, skin intact LLE Deficits / Details: grossly 3-/5 or less, good quad activation, limited DF/PF strength. testing limited by habitus    Cervical / Trunk Assessment Cervical / Trunk Assessment: Other exceptions Cervical / Trunk Exceptions: habitus  Communication   Communication Communication: No apparent difficulties  Cognition Arousal: Alert Behavior During Therapy: WFL for tasks assessed/performed Overall Cognitive Status: Within Functional Limits for tasks assessed                                          General Comments      Exercises     Assessment/Plan    PT Assessment Patient needs continued PT services  PT Problem List Decreased strength;Decreased range of motion;Decreased activity tolerance;Decreased balance;Decreased mobility;Decreased knowledge of use of DME;Decreased safety awareness;Cardiopulmonary status limiting activity;Obesity;Decreased skin integrity       PT Treatment Interventions DME instruction;Gait training;Stair training;Functional mobility training;Therapeutic activities;Therapeutic exercise;Balance training;Neuromuscular re-education;Cognitive remediation;Patient/family education;Wheelchair mobility training;Manual techniques    PT Goals (Current goals can be found in the Care Plan section)  Acute Rehab PT  Goals Patient Stated Goal: get better and home PT Goal Formulation: With patient Time For Goal Achievement: 04/05/23 Potential to Achieve Goals: Fair    Frequency Min 1X/week     Co-evaluation               AM-PAC PT "6 Clicks" Mobility  Outcome Measure Help needed turning from your back to your side while in a flat bed without using bedrails?: A Little Help needed moving from lying on your back to sitting on the side of a flat bed without using bedrails?: A Little Help needed moving to and from a bed to a chair (including a wheelchair)?: Total Help needed standing up from a chair using your arms (e.g., wheelchair or bedside chair)?: Total Help needed to walk in hospital room?: Total Help needed climbing 3-5 steps with a railing? : Total 6 Click Score: 10    End of Session   Activity Tolerance: Other (comment) (limited by nausea and headache) Patient left: in bed;with call bell/phone within reach;with bed alarm set Nurse Communication: Mobility status PT Visit Diagnosis: Other abnormalities of gait and mobility (R26.89);Difficulty in walking, not elsewhere classified (R26.2);Other symptoms and signs involving the  nervous system (R29.898)    Time: 1213-1228 PT Time Calculation (min) (ACUTE ONLY): 15 min   Charges:   PT Evaluation $PT Eval Low Complexity: 1 Low   PT General Charges $$ ACUTE PT VISIT: 1 Visit         Wynn Maudlin, DPT Acute Rehabilitation Services Office [phone]  03/22/23 1:24 PM

## 2023-03-22 NOTE — ED Notes (Signed)
MD at bedside. 

## 2023-03-22 NOTE — Progress Notes (Signed)
  Echocardiogram 2D Echocardiogram has been performed.  Karen Bennett 03/22/2023, 10:07 AM

## 2023-03-22 NOTE — Telephone Encounter (Signed)
Pharmacy Patient Advocate Encounter   Received notification that prior authorization for FreeStyle Libre 3 Sensor is required/requested.   Insurance verification completed.   The patient is insured through [facility] .   Per test claim: PA required; PA submitted to [facility] via CoverMyMeds Key/confirmation #/EOC [id number] Status is pending

## 2023-03-22 NOTE — Progress Notes (Addendum)
HOSPITALIST ROUNDING NOTE NIKITHA ANNAN ZOX:[id number]  DOB: 03-18-1976  DOA: 03/21/2023  PCP: [name], NP  03/22/2023,7:14 AM   LOS: 0 days      Code Status: Full   From: Home  current Dispo: Unclear     46.fem hypothyroid normocytic anemia HLD DM TY 2 CKD 3A at baseline chronic right leg radiculopathy HFpEF with pulmonary hypertension hypothyroid Known bradycardia with sinus pauses BMI 58 super morbid obesity prior complication of lower extremity cellulitis--- previous history of osteomyelitis fourth/fifth L metatarsal underwent hydrotherapy back in 2014 for this and has some history of 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 Has prior history of bacteremia with Candida Acinetobacter E faecalis-had a bacteremia previously in 2022 with confirmed mitral valve endocarditis at that time Acute PE 01/24/2023 with prior DVT supposed to be on Xarelto Recent hospitalization 7 19 through 02/23/2023 right leg pain lumbar radiculopathy recommended to follow-up outpatient orthopedics neurosurgery-had fluid overload drinking too much fluid was diuresed  Went to establish care 03/21/2023 started to hyperventilate developed word salad dissociation bilateral weakness-had only been taking "Xarelto and fluid pill"-strong family history of agoraphobia  Labs significant sodium 130 potassium 3.4 bicarb 17 gap 13 creatinine 1.3 EtOH less than 10 UA UDS pending A1c 8.2 CT head no evidence of acute infarct CTA neck no large vessel occlusion 40% R ICA stenosis severe left external carotid stenosis 8/29 MRI brain: Small focus of ischemia medial inferior right cerebellar hemisphere multifocal hyperintense signal white matter  Transcranial Doppler positive for PFO  Plan  Aphasia word salad Defer to neurology-TEE? Usual aspirin Plavix statin etc. etc. etc.-Triglycerides 336 VLDL 67 total cholesterol 340 will need aggressive risk factor education Permissive HTN secondary to stroke  Mild hyponatremia HFpEF with pleural  effusions Improved-Home Lasix 20 q. OD has been held, monitor trends of blood pressure Monitor fluid  CKD 3 AA at baseline Creatinine ranges anywhere from 1.9-1.3 Monitor trends  DM TY 2 A1c about 10 12/2023 Tests blood sugar but is not on any blood sugar specific medication It appears we will get freestyle Josephine Igo as this has been approved--Order # [id number]  May benefit from 70/30 insulin once daily versus Semglee 10 that she takes intermittently  Super morbid obesity BMI >55 Life-threatening  Prior BKA ? 2018 Dr. Farris Has side with prior bacteremia Prior left foot toe surgeries by Dr. Lajoyce Corners Needs to prevent complications with diabetes etc. Pain control oxycodone 10 every 12, Robaxin 500 twice daily She has been educated  DVT/PE history on Xarelto Might replace Xarelto with Eliquis as may be safer for according to neurology's input-we can try  Agoraphobia underlying psychiatric illness NOS Continue bupropion 100 every morning  DVT prophylaxis: Apixaban 5 twice daily  Status is: Observation The patient will require care spanning > 2 midnights and should be moved to inpatient because:   Will require inpatient stay    Subjective: Quite anxious I was told later on this morning about severe nausea and we have medicated her Overall doing fair Seems a little bit surprised to learn that she has had a stroke   Objective + exam Vitals:   03/22/23 0400 03/22/23 0500 03/22/23 0600 03/22/23 0628  BP: (!) 161/83 (!) 159/87 (!) 141/72   Pulse: 77 79 80   Resp: 16 12 17    Temp:    98.8 F (37.1 C)  TempSrc:    Oral  SpO2: 94% 99% 98%   Weight:      Height:  Filed Weights   03/21/23 1700 03/21/23 1833  Weight: (!) 164 kg (!) 164 kg    Examination:  Super morbidly obese no distress cannot visualize the back of her throat at all Neck soft supple S1-S2 no murmurs slight tachycardia Abdomen obese no meaningful assessment for organomegaly scheduling can be  performed She has a right BKA wound seems clean Left lower extremity has partial trans metatarsal amputations She has trace edema  Data Reviewed: reviewed   CBC    Component Value Date/Time   WBC 7.2 03/21/2023 1750   RBC 5.15 (H) 03/21/2023 1750   HGB 14.1 03/21/2023 1750   HCT 42.9 03/21/2023 1750   PLT 277 03/21/2023 1750   MCV 83.3 03/21/2023 1750   MCH 27.4 03/21/2023 1750   MCHC 32.9 03/21/2023 1750   RDW 13.2 03/21/2023 1750   LYMPHSABS 2.0 03/21/2023 1750   MONOABS 0.5 03/21/2023 1750   EOSABS 0.2 03/21/2023 1750   BASOSABS 0.0 03/21/2023 1750      Latest Ref Rng & Units 03/22/2023    4:16 AM 03/21/2023    5:50 PM 03/21/2023    5:49 PM  CMP  Glucose 70 - 99 mg/dL 191  478  295   BUN 6 - 20 mg/dL 14  13  15    Creatinine 0.44 - 1.00 mg/dL 6.21  3.08  6.57   Sodium 135 - 145 mmol/L 132  130  133   Potassium 3.5 - 5.1 mmol/L 3.5  3.4  3.5   Chloride 98 - 111 mmol/L 104  100  103   CO2 22 - 32 mmol/L 22  17    Calcium 8.9 - 10.3 mg/dL 8.4  8.6    Total Protein 6.5 - 8.1 g/dL  6.4    Total Bilirubin 0.3 - 1.2 mg/dL  1.0    Alkaline Phos 38 - 126 U/L  65    AST 15 - 41 U/L  11    ALT 0 - 44 U/L  11       Scheduled Meds:  [START ON 03/23/2023] apixaban  5 mg Oral BID   atorvastatin  40 mg Oral q1800   feeding supplement  237 mL Oral BID BM   insulin aspart  0-9 Units Subcutaneous Q4H   levothyroxine  50 mcg Oral Q0600   living well with diabetes book   Does not apply Once   Continuous Infusions:  Time  44  Rhetta Mura, MD  [facility]

## 2023-03-22 NOTE — Progress Notes (Addendum)
STROKE TEAM PROGRESS NOTE   BRIEF HPI Ms. Karen Bennett is a 47 y.o. female with history of  PE on xarelto, anxiety and depression, morbid obesity, DM, HTN, HLD, HF, CKD, Right BKA, hypothyroidism, infective endocarditis who presents to [facility] via EMS for aphasia.   SIGNIFICANT HOSPITAL EVENTS 8/28- Admitted for stroke workup 8/30- Plan for TEE  INTERIM HISTORY/SUBJECTIVE Will likely need TEE and TCD bubble study as well as continued anticoagulation.  States she takes  her xarelto with a cup of water- does not eat breakfast. Recommend changing to eliquis as she does not eat much.   OBJECTIVE  CBC    Component Value Date/Time   WBC 7.2 03/21/2023 1750   RBC 5.15 (H) 03/21/2023 1750   HGB 14.1 03/21/2023 1750   HCT 42.9 03/21/2023 1750   PLT 277 03/21/2023 1750   MCV 83.3 03/21/2023 1750   MCH 27.4 03/21/2023 1750   MCHC 32.9 03/21/2023 1750   RDW 13.2 03/21/2023 1750   LYMPHSABS 2.0 03/21/2023 1750   MONOABS 0.5 03/21/2023 1750   EOSABS 0.2 03/21/2023 1750   BASOSABS 0.0 03/21/2023 1750    BMET    Component Value Date/Time   NA 132 (L) 03/22/2023 0416   K 3.5 03/22/2023 0416   CL 104 03/22/2023 0416   CO2 22 03/22/2023 0416   GLUCOSE 146 (H) 03/22/2023 0416   BUN 14 03/22/2023 0416   CREATININE 1.71 (H) 03/22/2023 0416   CREATININE 0.70 09/01/2013 0954   CALCIUM 8.4 (L) 03/22/2023 0416   GFRNONAA 37 (L) 03/22/2023 0416   GFRNONAA >89 09/01/2013 0954    IMAGING past 24 hours MR Brain Wo Contrast (neuro protocol)  Result Date: 03/21/2023 CLINICAL DATA:  Aphasia EXAM: MRI HEAD WITHOUT CONTRAST TECHNIQUE: Multiplanar, multiecho pulse sequences of the brain and surrounding structures were obtained without intravenous contrast. COMPARISON:  None Available. FINDINGS: Brain: There is a small focus of abnormal diffusion restriction within the medial inferior right cerebellar hemisphere. No acute or chronic hemorrhage. There is multifocal hyperintense T2-weighted signal  within the white matter. Parenchymal volume and CSF spaces are normal. The midline structures are normal. Vascular: Major flow voids are preserved. Skull and upper cervical spine: Normal calvarium and skull base. Visualized upper cervical spine and soft tissues are normal. Sinuses/Orbits:No paranasal sinus fluid levels or advanced mucosal thickening. No mastoid or middle ear effusion. Normal orbits. IMPRESSION: 1. Small focus of acute ischemia within the medial inferior right cerebellar hemisphere. No hemorrhage or mass effect. 2. Multifocal hyperintense T2-weighted signal within the white matter, nonspecific but most commonly seen in the setting of chronic microvascular ischemia. Electronically Signed   By: Deatra Robinson M.D.   On: 03/21/2023 23:29   CT ANGIO HEAD NECK W WO CM (CODE STROKE)  Result Date: 03/21/2023 CLINICAL DATA:  Neuro deficit, acute, stroke suspected.  Aphasia EXAM: CT ANGIOGRAPHY HEAD AND NECK WITH AND WITHOUT CONTRAST TECHNIQUE: Multidetector CT imaging of the head and neck was performed using the standard protocol during bolus administration of intravenous contrast. Multiplanar CT image reconstructions and MIPs were obtained to evaluate the vascular anatomy. Carotid stenosis measurements (when applicable) are obtained utilizing NASCET criteria, using the distal internal carotid diameter as the denominator. RADIATION DOSE REDUCTION: This exam was performed according to the departmental dose-optimization program which includes automated exposure control, adjustment of the mA and/or kV according to patient size and/or use of iterative reconstruction technique. CONTRAST:  OMNIPAQUE IOHEXOL 350 MG/ML SOLN COMPARISON:  None Available. FINDINGS: CTA NECK  FINDINGS Aortic arch: Standard 3 vessel aortic arch with mild atherosclerotic calcification. No significant stenosis of the arch vessel origins. Right carotid system: Patent with a moderately large amount of predominantly calcified plaque  about the carotid bifurcation resulting in 40% stenosis of the ICA origin. Left carotid system: Patent with a moderately large amount of predominantly calcified plaque about the carotid bifurcation resulting in severe stenosis of the proximal external carotid artery but no significant stenosis of the common or internal carotid arteries. Vertebral arteries: Patent without evidence of significant stenosis or dissection. Strongly dominant left vertebral artery. Skeleton: Mild cervical spondylosis. Other neck: No evidence of cervical lymphadenopathy or mass. Upper chest: Partially visualized small pleural effusions, right larger than left. Nonspecific mosaic attenuation in the lungs bilaterally. Coronary atherosclerosis. Review of the MIP images confirms the above findings CTA HEAD FINDINGS Anterior circulation: The internal carotid arteries are patent from skull base to carotid termini with mild-to-moderate atherosclerotic calcification bilaterally not resulting in significant stenosis. ACAs and MCAs are patent without evidence of a proximal branch occlusion or significant proximal stenosis. No aneurysm is identified. Posterior circulation: The intracranial vertebral arteries are patent with the left supplying the basilar in the right terminating in PICA. Left V4 segment atherosclerosis results in mild stenosis. Patent PICA and SCA origins are visualized bilaterally. The basilar artery is widely patent. Posterior communicating arteries are diminutive or absent. The PCAs are patent with asymmetric right-sided branch vessel irregularity but no evidence of a flow limiting proximal stenosis. No aneurysm is identified. Venous sinuses: As permitted by contrast timing, patent. Anatomic variants: Hypoplastic right vertebral artery terminating in PICA. Review of the MIP images confirms the above findings IMPRESSION: 1. No large vessel occlusion. 2. Age advanced atherosclerosis in the head and neck. No flow limiting proximal  intracranial stenosis. 3. 40% right ICA origin stenosis. 4. Severe left external carotid artery origin stenosis. 5. Small pleural effusions. 6.  Aortic Atherosclerosis (ICD10-I70.0). Electronically Signed   By: Sebastian Ache M.D.   On: 03/21/2023 18:33   CT HEAD CODE STROKE WO CONTRAST  Result Date: 03/21/2023 CLINICAL DATA:  Code stroke. Neuro deficit, acute, stroke suspected. Aphasia. EXAM: CT HEAD WITHOUT CONTRAST TECHNIQUE: Contiguous axial images were obtained from the base of the skull through the vertex without intravenous contrast. RADIATION DOSE REDUCTION: This exam was performed according to the departmental dose-optimization program which includes automated exposure control, adjustment of the mA and/or kV according to patient size and/or use of iterative reconstruction technique. COMPARISON:  None Available. FINDINGS: Brain: Small hypodensities in the white matter adjacent to the frontal horn of the right lateral ventricle are nonspecific but most resemble small-vessel ischemia, including a discrete, chronic appearing lacunar infarct anteriorly. The ventricles are normal in size. Vascular: Age advanced atherosclerosis at the skull base. Skull: No acute fracture or suspicious osseous lesion. Sinuses/Orbits: Visualized paranasal sinuses and mastoid air cells are clear. Unremarkable orbits. Other: None. ASPECTS (Alberta Stroke Program Early CT Score) - Ganglionic level infarction (caudate, lentiform nuclei, internal capsule, insula, M1-M3 cortex): 7 - Supraganglionic infarction (M4-M6 cortex): 3 Total score (0-10 with 10 being normal): 10 These results were communicated to Dr. Derry Lory at 6:07 pm on 03/21/2023 by text page via the [facility] messaging system. IMPRESSION: 1. No evidence of acute cortical infarct or intracranial hemorrhage. ASPECTS of 10. 2. Chronic small-vessel type changes in the right frontal periventricular white matter. 3. Age advanced atherosclerosis. Electronically Signed   By: Sebastian Ache M.D.   On: 03/21/2023 18:08  Vitals:   03/22/23 0700 03/22/23 0730 03/22/23 0743 03/22/23 0744  BP: 110/67   110/67  Pulse: (!) 59 73  71  Resp: 17 19  12   Temp:   98.6 F (37 C)   TempSrc:   Oral   SpO2: 94% 97%  99%  Weight:      Height:         PHYSICAL EXAM General:  Alert, well-nourished, well-developed patient in no acute distress Psych:  Mood and affect appropriate for situation CV: Regular rate and rhythm on monitor Respiratory:  Regular, unlabored respirations on room air GI: Abdomen soft and nontender Ext: Right BKA  NEURO:  Mental Status: AA&Ox3, patient is able to give clear and coherent history Speech/Language: speech is without dysarthria or aphasia.  Naming, repetition, fluency, and comprehension intact.  Cranial Nerves:  II: PERRL. Visual fields full.  III, IV, VI: EOMI. Eyelids elevate symmetrically.  V: Sensation is intact to light touch and symmetrical to face.  VII: Face is symmetrical resting and smiling VIII: hearing intact to voice. IX, X: Palate elevates symmetrically. Phonation is normal.  HQ:IONGEXBM shrug 5/5. XII: tongue is midline without fasciculations. Motor: 5/5 strength to all muscle groups tested.  Tone: is normal and bulk is normal Sensation- Intact to light touch bilaterally. Extinction absent to light touch to DSS.   Coordination: FTN intact bilaterally, HKS: no ataxia in BLE.No drift.  Gait- deferred   ASSESSMENT/PLAN  Acute Ischemic Infarct:  Right inferior cerebellar hemisphere -clinically silent Etiology:  likely embolic   Code Stroke CT head No acute abnormality.  CTA head & neck 40% right ICA origin stenosis MRI  Small focus of acute ischemia within the medial inferior right cerebellar hemisphere. No hemorrhage or mass effect. Multifocal hyperintense T2-weighted signal within the white matter, nonspecific but most commonly seen in the setting of chronic microvascular ischemia. TEE 8/30- 1400 TCD Bubble- Positive  for PFO LDL 238 HgbA1c 10.7 VTE prophylaxis - Eliquis Xarelto (rivaroxaban) daily prior to admission, now on Eliquis (apixaban) daily Therapy recommendations:  Pending  Disposition:  Pending   Hx of PE Home Meds: Xarelto  Plan to switch to eliquis   Congestive Heart Failure Hypertension Home meds: Lasix Stable Blood Pressure Goal: BP less than 220/110   Hyperlipidemia Home meds:  Lipitor 40mg , resumed in hospital LDL 238, goal < 70 Continue statin at discharge  Diabetes type II Uncontrolled Home meds:  Insulin HgbA1c 10.7, goal < 7.0 CBGs SSI Recommend close follow-up with PCP for better DM control  Other Stroke Risk Factors ETOH use, alcohol level <10, advised to drink no more than 1 drink(s) a day Obesity, Body mass index is 58.36 kg/m., BMI >/= 30 associated with increased stroke risk, recommend weight loss, diet and exercise as appropriate   Other Active Problems Hypothyroidism Hx infective endocarditis  Hospital day # 0  Patient seen and examined by NP/APP with MD. MD to update note as needed.   Elmer Picker, DNP, FNP-BC [facility] Pager: [phone]  STROKE MD NOTE : I have personally obtained history,examined this patient, reviewed notes, independently viewed imaging studies, participated in medical decision making and plan of care.ROS completed by me personally and pertinent positives fully documented  I have made any additions or clarifications directly to the above note. Agree with note above.  Patient with recent diagnosis of pulm embolism and taking Xarelto presented with aphasia and altered mental status which was transient.  MRI shows no left hemispheric infarct but shows likely clinically silent  right cerebellar embolic infarct.  TCD bubble study strongly positive raising concern for paradoxical embolism.  Recommend switch Xarelto to Eliquis 5 mg twice daily for better compliance and possibly efficacy as well.  TEE to confirm right-to-left  shunt patient may need elective endovascular PFO closure later.  Continue ongoing stroke workup.  Aggressive risk factor modification.  On discussion with patient and answered questions.  Discussed with Dr. Mahala Menghini.  Greater than 50% time during this 50 minute visit was spent on counseling and coordination of care about her silent cerebral stroke and episode of aphasia and discussion about paradoxical embolism and need for changing anticoagulation from Xarelto to Eliquis and answering questions  Delia Heady, MD Medical Director [facility] Pager: [phone] 03/22/2023 3:37 PM  To contact Stroke Continuity provider, please refer to [email or web address]. After hours, contact General Neurology

## 2023-03-23 ENCOUNTER — Observation Stay (HOSPITAL_COMMUNITY): Payer: 59 | Admitting: Anesthesiology

## 2023-03-23 ENCOUNTER — Encounter (HOSPITAL_COMMUNITY)
Admission: EM | Disposition: A | Discharge status: Home Health | Payer: Self-pay | Source: Home / Self Care | Attending: Family Medicine

## 2023-03-23 ENCOUNTER — Other Ambulatory Visit (HOSPITAL_COMMUNITY): Payer: Self-pay

## 2023-03-23 ENCOUNTER — Observation Stay (HOSPITAL_COMMUNITY): Payer: 59

## 2023-03-23 DIAGNOSIS — E876 Hypokalemia: Secondary | ICD-10-CM | POA: Diagnosis present

## 2023-03-23 DIAGNOSIS — I110 Hypertensive heart disease with heart failure: Secondary | ICD-10-CM | POA: Diagnosis present

## 2023-03-23 DIAGNOSIS — I272 Pulmonary hypertension, unspecified: Secondary | ICD-10-CM | POA: Diagnosis present

## 2023-03-23 DIAGNOSIS — I639 Cerebral infarction, unspecified: Secondary | ICD-10-CM

## 2023-03-23 DIAGNOSIS — E1169 Type 2 diabetes mellitus with other specified complication: Secondary | ICD-10-CM | POA: Diagnosis present

## 2023-03-23 DIAGNOSIS — G8929 Other chronic pain: Secondary | ICD-10-CM | POA: Diagnosis present

## 2023-03-23 DIAGNOSIS — I10 Essential (primary) hypertension: Secondary | ICD-10-CM

## 2023-03-23 DIAGNOSIS — E1151 Type 2 diabetes mellitus with diabetic peripheral angiopathy without gangrene: Secondary | ICD-10-CM

## 2023-03-23 DIAGNOSIS — E1165 Type 2 diabetes mellitus with hyperglycemia: Secondary | ICD-10-CM | POA: Diagnosis present

## 2023-03-23 DIAGNOSIS — Q2112 Patent foramen ovale: Secondary | ICD-10-CM | POA: Diagnosis not present

## 2023-03-23 DIAGNOSIS — I63541 Cerebral infarction due to unspecified occlusion or stenosis of right cerebellar artery: Secondary | ICD-10-CM | POA: Diagnosis present

## 2023-03-23 DIAGNOSIS — I081 Rheumatic disorders of both mitral and tricuspid valves: Secondary | ICD-10-CM | POA: Diagnosis present

## 2023-03-23 DIAGNOSIS — E039 Hypothyroidism, unspecified: Secondary | ICD-10-CM

## 2023-03-23 DIAGNOSIS — Z89511 Acquired absence of right leg below knee: Secondary | ICD-10-CM | POA: Diagnosis not present

## 2023-03-23 DIAGNOSIS — Z6841 Body Mass Index (BMI) 40.0 and over, adult: Secondary | ICD-10-CM | POA: Diagnosis not present

## 2023-03-23 DIAGNOSIS — R4701 Aphasia: Secondary | ICD-10-CM | POA: Diagnosis present

## 2023-03-23 DIAGNOSIS — Z794 Long term (current) use of insulin: Secondary | ICD-10-CM | POA: Diagnosis not present

## 2023-03-23 DIAGNOSIS — E1122 Type 2 diabetes mellitus with diabetic chronic kidney disease: Secondary | ICD-10-CM | POA: Diagnosis present

## 2023-03-23 DIAGNOSIS — I340 Nonrheumatic mitral (valve) insufficiency: Secondary | ICD-10-CM | POA: Diagnosis not present

## 2023-03-23 DIAGNOSIS — E871 Hypo-osmolality and hyponatremia: Secondary | ICD-10-CM | POA: Diagnosis present

## 2023-03-23 DIAGNOSIS — I5032 Chronic diastolic (congestive) heart failure: Secondary | ICD-10-CM | POA: Diagnosis present

## 2023-03-23 DIAGNOSIS — N1831 Chronic kidney disease, stage 3a: Secondary | ICD-10-CM | POA: Diagnosis present

## 2023-03-23 DIAGNOSIS — F32A Depression, unspecified: Secondary | ICD-10-CM | POA: Diagnosis present

## 2023-03-23 DIAGNOSIS — E785 Hyperlipidemia, unspecified: Secondary | ICD-10-CM | POA: Diagnosis present

## 2023-03-23 DIAGNOSIS — Z79890 Hormone replacement therapy (postmenopausal): Secondary | ICD-10-CM | POA: Diagnosis not present

## 2023-03-23 DIAGNOSIS — Z86711 Personal history of pulmonary embolism: Secondary | ICD-10-CM | POA: Diagnosis not present

## 2023-03-23 HISTORY — PX: TEE WITHOUT CARDIOVERSION: SHX5443

## 2023-03-23 LAB — GLUCOSE, CAPILLARY
Glucose-Capillary: 102 mg/dL — ABNORMAL HIGH (ref 70–99)
Glucose-Capillary: 109 mg/dL — ABNORMAL HIGH (ref 70–99)
Glucose-Capillary: 116 mg/dL — ABNORMAL HIGH (ref 70–99)
Glucose-Capillary: 82 mg/dL (ref 70–99)
Glucose-Capillary: 93 mg/dL (ref 70–99)
Glucose-Capillary: 95 mg/dL (ref 70–99)

## 2023-03-23 LAB — ECHO TEE
AV Mean grad: 6 mmHg
AV Peak grad: 11.7 mmHg
Ao pk vel: 1.71 m/s

## 2023-03-23 SURGERY — ECHOCARDIOGRAM, TRANSESOPHAGEAL
Anesthesia: Monitor Anesthesia Care

## 2023-03-23 MED ORDER — PROPOFOL 10 MG/ML IV BOLUS
INTRAVENOUS | Status: DC | PRN
  Administered 2023-03-23: 80 mg via INTRAVENOUS

## 2023-03-23 MED ORDER — ATORVASTATIN CALCIUM 40 MG PO TABS
40.0000 mg | ORAL_TABLET | Freq: Every day | ORAL | 2 refills | Status: DC
Start: 2023-03-23 — End: 2023-08-21
  Filled 2023-03-23: qty 30, 30d supply, fill #0

## 2023-03-23 MED ORDER — FREESTYLE LIBRE 3 SENSOR MISC
1.0000 | 0 refills | Status: AC
  Filled 2023-03-23: qty 2, 28d supply, fill #0

## 2023-03-23 MED ORDER — SODIUM CHLORIDE 0.9 % IV SOLN
INTRAVENOUS | Status: DC | PRN
Start: 2023-03-23 — End: 2023-03-23

## 2023-03-23 MED ORDER — SEMGLEE 100 UNIT/ML ~~LOC~~ SOPN
10.0000 [IU] | PEN_INJECTOR | Freq: Every day | SUBCUTANEOUS | 0 refills | Status: DC
  Filled 2023-03-23: qty 15, 150d supply, fill #0

## 2023-03-23 MED ORDER — APIXABAN 5 MG PO TABS
5.0000 mg | ORAL_TABLET | Freq: Two times a day (BID) | ORAL | 11 refills | Status: AC
  Filled 2023-03-23: qty 60, 30d supply, fill #0

## 2023-03-23 MED ORDER — PROPOFOL 500 MG/50ML IV EMUL
INTRAVENOUS | Status: DC | PRN
  Administered 2023-03-23: 100 ug/kg/min via INTRAVENOUS

## 2023-03-23 MED ORDER — DEXTROSE-SODIUM CHLORIDE 5-0.45 % IV SOLN: INTRAVENOUS | Status: DC

## 2023-03-23 MED ORDER — EPHEDRINE SULFATE (PRESSORS) 50 MG/ML IJ SOLN
INTRAMUSCULAR | Status: DC | PRN
Start: 2023-03-23 — End: 2023-03-23
  Administered 2023-03-23: 10 mg via INTRAVENOUS

## 2023-03-23 MED ORDER — DEXTROSE 10 % IV SOLN: INTRAVENOUS | Status: DC

## 2023-03-23 MED ORDER — LIDOCAINE 2% (20 MG/ML) 5 ML SYRINGE
INTRAMUSCULAR | Status: DC | PRN
  Administered 2023-03-23: 20 mg via INTRAVENOUS

## 2023-03-23 NOTE — Anesthesia Postprocedure Evaluation (Signed)
Anesthesia Post Note  Patient: Karen Bennett  Procedure(s) Performed: TRANSESOPHAGEAL ECHOCARDIOGRAM     Patient location during evaluation: Cath Lab Anesthesia Type: MAC Level of consciousness: awake and alert Pain management: pain level controlled Vital Signs Assessment: post-procedure vital signs reviewed and stable Respiratory status: spontaneous breathing, nonlabored ventilation and respiratory function stable Cardiovascular status: stable and blood pressure returned to baseline Postop Assessment: no apparent nausea or vomiting Anesthetic complications: no  No notable events documented.  Last Vitals:  Vitals:   03/23/23 1505 03/23/23 1510  BP: (!) 146/67 (!) 142/75  Pulse: 69 72  Resp: 18 18  Temp:    SpO2: 92% 92%    Last Pain:  Vitals:   03/23/23 1505  TempSrc:   PainSc: 0-No pain                 Hideo Googe,W. EDMOND

## 2023-03-23 NOTE — Progress Notes (Addendum)
STROKE TEAM PROGRESS NOTE   BRIEF HPI Ms. Karen Bennett is a 47 y.o. female with history of  PE on xarelto, anxiety and depression, morbid obesity, DM, HTN, HLD, HF, CKD, Right BKA, hypothyroidism, infective endocarditis who presents to [facility] ED via EMS for aphasia.   SIGNIFICANT HOSPITAL EVENTS 8/28- Admitted for stroke workup 8/30- Plan for TEE  INTERIM HISTORY/SUBJECTIVE Pt reports no conerns and that she is back to her baseline. Discussed that if she is demonstrated to have a PFO that we will have a wait a year for her to have anticoagulation for her recent pulmonary embolism as we have to hold DOAC so she can get the PFO closure procedure. She voiced understanding.   OBJECTIVE  CBC    Component Value Date/Time   WBC 7.2 03/21/2023 1750   RBC 5.15 (H) 03/21/2023 1750   HGB 14.1 03/21/2023 1750   HCT 42.9 03/21/2023 1750   PLT 277 03/21/2023 1750   MCV 83.3 03/21/2023 1750   MCH 27.4 03/21/2023 1750   MCHC 32.9 03/21/2023 1750   RDW 13.2 03/21/2023 1750   LYMPHSABS 2.0 03/21/2023 1750   MONOABS 0.5 03/21/2023 1750   EOSABS 0.2 03/21/2023 1750   BASOSABS 0.0 03/21/2023 1750    BMET    Component Value Date/Time   NA 132 (L) 03/22/2023 0416   K 3.5 03/22/2023 0416   CL 104 03/22/2023 0416   CO2 22 03/22/2023 0416   GLUCOSE 146 (H) 03/22/2023 0416   BUN 14 03/22/2023 0416   CREATININE 1.71 (H) 03/22/2023 0416   CREATININE 0.70 09/01/2013 0954   CALCIUM 8.4 (L) 03/22/2023 0416   GFRNONAA 37 (L) 03/22/2023 0416   GFRNONAA >89 09/01/2013 0954    IMAGING past 24 hours ECHO TEE  Result Date: 03/23/2023    TRANSESOPHOGEAL ECHO REPORT   Patient Name:   Karen Bennett Date of Exam: 03/23/2023 Medical Rec #:  [id number]       Height:       66.0 in Accession #:    [id number]      Weight:       361.6 lb Date of Birth:  February 28, 1976      BSA:          2.572 m Patient Age:    46 years        BP:           154/73 mmHg Patient Gender: F               HR:           61 bpm. Exam  Location:  Inpatient Procedure: Transesophageal Echo, Cardiac Doppler and Color Doppler Indications:     Stroke  History:         Patient has prior history of Echocardiogram examinations, most                  recent 03/22/2023. Abnormal ECG, Endocarditis,                  Signs/Symptoms:Bacteremia; Risk Factors:Diabetes, Hypertension                  and Current Smoker. Pulmonary embolus.  Sonographer:     Sheralyn Boatman RDCS Referring Phys:  [id number] Corrin Parker Diagnosing Phys: Epifanio Lesches MD PROCEDURE: After discussion of the risks and benefits of a TEE, an informed consent was obtained from the patient. The transesophogeal probe was passed without difficulty through the esophogus of the patient. Imaged  were obtained with the patient in a left lateral decubitus position. Sedation performed by different physician. The patient was monitored while under deep sedation. Anesthestetic sedation was provided intravenously by Anesthesiology: 283mg  of Propofol, 20mg  of Lidocaine. The patient's vital signs; including heart rate, blood pressure, and oxygen saturation; remained stable throughout the procedure. The patient developed no complications during the procedure.  IMPRESSIONS  1. Left ventricular ejection fraction, by estimation, is 45 to 50%. The left ventricle has mildly decreased function. There is the interventricular septum is flattened in systole, consistent with right ventricular pressure overload.  2. Right ventricular systolic function is mildly reduced. The right ventricular size is mildly enlarged.  3. Left atrial size was mildly dilated. No left atrial/left atrial appendage thrombus was detected.  4. Right atrial size was mildly dilated.  5. The mitral valve is normal in structure. Mild mitral valve regurgitation.  6. The aortic valve is tricuspid. Aortic valve regurgitation is trivial. Aortic valve sclerosis/calcification is present, without any evidence of aortic stenosis.  7. Agitated saline  contrast bubble study was positive with shunting observed within 3-6 cardiac cycles suggestive of interatrial shunt. FINDINGS  Left Ventricle: Left ventricular ejection fraction, by estimation, is 45 to 50%. The left ventricle has mildly decreased function. The left ventricular internal cavity size was normal in size. The interventricular septum is flattened in systole, consistent with right ventricular pressure overload. Right Ventricle: The right ventricular size is mildly enlarged. No increase in right ventricular wall thickness. Right ventricular systolic function is mildly reduced. Left Atrium: Left atrial size was mildly dilated. No left atrial/left atrial appendage thrombus was detected. Right Atrium: Right atrial size was mildly dilated. Pericardium: There is no evidence of pericardial effusion. Mitral Valve: The mitral valve is normal in structure. Mild mitral valve regurgitation. Tricuspid Valve: The tricuspid valve is normal in structure. Tricuspid valve regurgitation is mild . No evidence of tricuspid stenosis. Aortic Valve: The aortic valve is tricuspid. Aortic valve regurgitation is trivial. Aortic valve sclerosis/calcification is present, without any evidence of aortic stenosis. Aortic valve mean gradient measures 6.0 mmHg. Aortic valve peak gradient measures 11.7 mmHg. Pulmonic Valve: The pulmonic valve was grossly normal. Pulmonic valve regurgitation is trivial. No evidence of pulmonic stenosis. Aorta: The aortic root and ascending aorta are structurally normal, with no evidence of dilitation. IAS/Shunts: No atrial level shunt detected by color flow Doppler. Agitated saline contrast bubble study was positive with shunting observed within 3-6 cardiac cycles suggestive of interatrial shunt. Additional Comments: Spectral Doppler performed. AORTIC VALVE AV Vmax:      171.00 cm/s AV Vmean:     120.000 cm/s AV VTI:       0.338 m AV Peak Grad: 11.7 mmHg AV Mean Grad: 6.0 mmHg  AORTA Ao Asc diam: 3.70 cm  Epifanio Lesches MD Electronically signed by Epifanio Lesches MD Signature Date/Time: 03/23/2023/3:35:06 PM    Final     Vitals:   03/23/23 1505 03/23/23 1510 03/23/23 1513 03/23/23 1515  BP: (!) 146/67 (!) 142/75  (!) 154/73  Pulse: 69 72  71  Resp: 18 18  16   Temp:   (!) 97.5 F (36.4 C)   TempSrc:   Temporal   SpO2: 92% 92%  94%  Weight:      Height:         PHYSICAL EXAM General:  Alert, well-nourished, well-developed patient in no acute distress Psych:  Mood and affect appropriate for situation CV: Regular rate and rhythm on monitor Respiratory:  Regular, unlabored respirations  on room air GI: Abdomen soft and nontender Ext: Right BKA  NEURO:  Mental Status: AA&Ox3, patient is able to give clear and coherent history Speech/Language: speech is without dysarthria or aphasia.  Naming, repetition, fluency, and comprehension intact.  Cranial Nerves:  II: PERRL. Visual fields full.  III, IV, VI: EOMI. Eyelids elevate symmetrically.  V: Sensation is intact to light touch and symmetrical to face.  VII: Face is symmetrical resting and smiling VIII: hearing intact to voice. IX, X: Palate elevates symmetrically. Phonation is normal.  ZO:[id number] shrug 5/5. XII: tongue is midline without fasciculations. Motor: 5/5 strength to all muscle groups tested.  Tone: is normal and bulk is normal Sensation- Intact to light touch bilaterally. Extinction absent to light touch to DSS.   Coordination: FTN intact bilaterally, HKS: no ataxia in BLE.No drift.  Gait- deferred   ASSESSMENT/PLAN  Acute Ischemic Infarct:  Right inferior cerebellar hemisphere -clinically silent Etiology:  likely paradoxical embolic from PFO Code Stroke CT head No acute abnormality.  CTA head & neck 40% right ICA origin stenosis MRI  Small focus of acute ischemia within the medial inferior right cerebellar hemisphere. No hemorrhage or mass effect. Multifocal hyperintense T2-weighted signal within the white  matter, nonspecific but most commonly seen in the setting of chronic microvascular ischemia. TEE positive for PFO TCD Bubble- Positive for PFO LDL 238 HgbA1c 10.7 VTE prophylaxis - Eliquis Xarelto (rivaroxaban) daily prior to admission, now on Eliquis (apixaban) daily Therapy recommendations:  home health PT  Disposition:  Pending   Hx of PE Home Meds: Xarelto  Switched to eliquis  Congestive Heart Failure Hypertension Home meds: Lasix Stable Blood Pressure Goal: BP less than 220/110   Hyperlipidemia Home meds:  Lipitor 40mg , resumed in hospital LDL 238, goal < 70 Continue statin at discharge  Diabetes type II Uncontrolled Home meds:  Insulin HgbA1c 10.7, goal < 7.0 CBGs SSI Recommend close follow-up with PCP for better DM control  Other Stroke Risk Factors ETOH use, alcohol level <10, advised to drink no more than 1 drink(s) a day Obesity, Body mass index is 58.36 kg/m., BMI >/= 30 associated with increased stroke risk, recommend weight loss, diet and exercise as appropriate   Other Active Problems Hypothyroidism Hx infective endocarditis  Hospital day # 0  Meryl Dare, MD PGY-1 Psychiatry Resident 03/23/2023, 3:43 PM I have personally obtained history,examined this patient, reviewed notes, independently viewed imaging studies, participated in medical decision making and plan of care.ROS completed by me personally and pertinent positives fully documented  I have made any additions or clarifications directly to the above note. Agree with note above.  Patient is on anticoagulation for recent pulmonary embolism and has had episode of right cerebellar infarct likely from paradoxical embolism from right to left cardiac shunt.  Recommend finishing anticoagulation course for pulmonary embolism prior to endovascular closure of right to left cardiac shunt as patient may have to hold anticoagulation for elective PFO closure later.  Mobilize out of bed.  Therapy consult.   Aggressive risk factor modification.  Long discussion with patient and answered questions.  Discussed with Dr. Mahala Menghini.  Greater than 50% time than 35-minute visit was spent in counseling and coordination of care discussion patient care team and answered questions.  Stroke team will sign off.  Can to call for questions follow-up with her outpatient stroke clinic in 2 months  Delia Heady, MD Medical Director [facility] Pager: [phone] 03/23/2023 4:15 PM   To contact Stroke Continuity provider, please refer  to [email or web address]. After hours, contact General Neurology

## 2023-03-23 NOTE — Interval H&P Note (Signed)
History and Physical Interval Note:  03/23/2023 2:16 PM  Karen Bennett  has presented today for surgery, with the diagnosis of Stroke.  The various methods of treatment have been discussed with the patient and family. After consideration of risks, benefits and other options for treatment, the patient has consented to  Procedure(s): TRANSESOPHAGEAL ECHOCARDIOGRAM (N/A) as a surgical intervention.  The patient's history has been reviewed, patient examined, no change in status, stable for surgery.  I have reviewed the patient's chart and labs.  Questions were answered to the patient's satisfaction.     [name]

## 2023-03-23 NOTE — TOC Transition Note (Signed)
Transition of Care [facility]) - CM/SW Discharge Note   Patient Details  Name: Karen Bennett MRN: [id number] Date of Birth: 1975-11-17  Transition of Care [facility]) CM/SW Contact:  Janae Bridgeman, RN Phone Number: 03/23/2023, 4:11 PM   Clinical Narrative:    CM spoke with bedside nursing and patient is discharging home today.  The patient will need PTAR transport home since the patient is non-ambulatory and uses electric wheelchair as mode of transportation in the home.  PTAR is arranged and bedside nursing to provide discharge instructions to the patient.   Final next level of care: Home w Home Health Services (Patient currently active with personal care services 5 days per week/ 5-6 hours per day) Barriers to Discharge: Continued Medical Work up   Patient Goals and CMS Choice CMS Medicare.gov Compare Post Acute Care list provided to:: Patient Choice offered to / list presented to : Patient  Discharge Placement                         Discharge Plan and Services Additional resources added to the After Visit Summary for     Discharge Planning Services: CM Consult Post Acute Care Choice: Resumption of Svcs/PTA Provider                    HH Arranged: Nurse's Aide (Patient has personal care services through [facility])          Social Determinants of Health (SDOH) Interventions SDOH Screenings   Food Insecurity: No Food Insecurity (03/22/2023)  Recent Concern: Food Insecurity - Food Insecurity Present (03/21/2023)   Received from [facility]  Housing: Patient Declined (03/22/2023)  Transportation Needs: No Transportation Needs (03/22/2023)  Recent Concern: Transportation Needs - Unmet Transportation Needs (03/21/2023)   Received from [facility]  Utilities: Not At Risk (03/22/2023)  Financial Resource Strain: High Risk (03/21/2023)   Received from [facility]  Physical Activity: Inactive (03/30/2021)   Received from [facility],  [facility]  Social Connections: Unknown (11/24/2021)   Received from [facility], [facility]  Stress: Stress Concern Present (03/30/2021)   Received from [facility], [facility]  Tobacco Use: Low Risk  (03/22/2023)  Recent Concern: Tobacco Use - High Risk (03/21/2023)   Received from [facility]     Readmission Risk Interventions    02/13/2023   11:23 AM 01/26/2023    3:33 PM 01/16/2023   11:06 AM  Readmission Risk Prevention Plan  Transportation Screening Complete Complete Complete  PCP or Specialist Appt within 5-7 Days   Complete  PCP or Specialist Appt within 3-5 Days Complete Complete   Home Care Screening   Complete  Medication Review (RN CM)   Complete  HRI or Home Care Consult Complete Complete   Social Work Consult for Recovery Care Planning/Counseling Complete Complete   Palliative Care Screening Complete Not Applicable   Medication Review Oceanographer) Complete Complete

## 2023-03-23 NOTE — Plan of Care (Signed)
Problem: Education: Goal: Ability to describe self-care measures that may prevent or decrease complications (Diabetes Survival Skills Education) will improve 03/23/2023 1822 by Olena Heckle, LPN Outcome: Progressing 03/23/2023 1652 by Olena Heckle, LPN Outcome: Adequate for Discharge Goal: Individualized Educational Video(s) 03/23/2023 1822 by Olena Heckle, LPN Outcome: Progressing 03/23/2023 1652 by Olena Heckle, LPN Outcome: Adequate for Discharge   Problem: Coping: Goal: Ability to adjust to condition or change in health will improve 03/23/2023 1822 by Olena Heckle, LPN Outcome: Progressing 03/23/2023 1652 by Olena Heckle, LPN Outcome: Adequate for Discharge   Problem: Fluid Volume: Goal: Ability to maintain a balanced intake and output will improve 03/23/2023 1822 by Olena Heckle, LPN Outcome: Progressing 03/23/2023 1652 by Olena Heckle, LPN Outcome: Adequate for Discharge   Problem: Health Behavior/Discharge Planning: Goal: Ability to identify and utilize available resources and services will improve 03/23/2023 1822 by Olena Heckle, LPN Outcome: Progressing 03/23/2023 1652 by Olena Heckle, LPN Outcome: Adequate for Discharge Goal: Ability to manage health-related needs will improve 03/23/2023 1822 by Olena Heckle, LPN Outcome: Progressing 03/23/2023 1652 by Olena Heckle, LPN Outcome: Adequate for Discharge   Problem: Metabolic: Goal: Ability to maintain appropriate glucose levels will improve 03/23/2023 1822 by Olena Heckle, LPN Outcome: Progressing 03/23/2023 1652 by Olena Heckle, LPN Outcome: Adequate for Discharge   Problem: Nutritional: Goal: Maintenance of adequate nutrition will improve 03/23/2023 1822 by Olena Heckle, LPN Outcome: Progressing 03/23/2023 1652 by Olena Heckle, LPN Outcome: Adequate for Discharge Goal: Progress toward achieving an optimal weight will improve 03/23/2023 1822 by Olena Heckle, LPN Outcome:  Progressing 03/23/2023 1652 by Olena Heckle, LPN Outcome: Adequate for Discharge   Problem: Skin Integrity: Goal: Risk for impaired skin integrity will decrease 03/23/2023 1822 by Olena Heckle, LPN Outcome: Progressing 03/23/2023 1652 by Olena Heckle, LPN Outcome: Adequate for Discharge   Problem: Tissue Perfusion: Goal: Adequacy of tissue perfusion will improve 03/23/2023 1822 by Olena Heckle, LPN Outcome: Progressing 03/23/2023 1652 by Olena Heckle, LPN Outcome: Adequate for Discharge   Problem: Education: Goal: Knowledge of disease or condition will improve 03/23/2023 1822 by Olena Heckle, LPN Outcome: Progressing 03/23/2023 1652 by Olena Heckle, LPN Outcome: Adequate for Discharge Goal: Knowledge of secondary prevention will improve (MUST DOCUMENT ALL) 03/23/2023 1822 by Olena Heckle, LPN Outcome: Progressing 03/23/2023 1652 by Olena Heckle, LPN Outcome: Adequate for Discharge Goal: Knowledge of patient specific risk factors will improve Loraine Leriche N/A or DELETE if not current risk factor) 03/23/2023 1822 by Olena Heckle, LPN Outcome: Progressing 03/23/2023 1652 by Olena Heckle, LPN Outcome: Adequate for Discharge   Problem: Ischemic Stroke/TIA Tissue Perfusion: Goal: Complications of ischemic stroke/TIA will be minimized 03/23/2023 1822 by Olena Heckle, LPN Outcome: Progressing 03/23/2023 1652 by Olena Heckle, LPN Outcome: Adequate for Discharge   Problem: Coping: Goal: Will verbalize positive feelings about self 03/23/2023 1822 by Olena Heckle, LPN Outcome: Progressing 03/23/2023 1652 by Olena Heckle, LPN Outcome: Adequate for Discharge Goal: Will identify appropriate support needs 03/23/2023 1822 by Olena Heckle, LPN Outcome: Progressing 03/23/2023 1652 by Olena Heckle, LPN Outcome: Adequate for Discharge   Problem: Health Behavior/Discharge Planning: Goal: Ability to manage health-related needs will improve 03/23/2023 1822 by Olena Heckle,  LPN Outcome: Progressing 03/23/2023 1652 by Olena Heckle, LPN Outcome: Adequate for Discharge Goal: Goals will be collaboratively established with patient/family 03/23/2023 1822 by Olena Heckle, LPN Outcome: Progressing 03/23/2023 1652 by Olena Heckle, LPN Outcome: Adequate for Discharge   Problem: Self-Care: Goal: Ability to participate in self-care as condition permits will improve 03/23/2023 1822 by Olena Heckle, LPN Outcome:  Progressing 03/23/2023 1652 by Olena Heckle, LPN Outcome: Adequate for Discharge Goal: Verbalization of feelings and concerns over difficulty with self-care will improve 03/23/2023 1822 by Olena Heckle, LPN Outcome: Progressing 03/23/2023 1652 by Olena Heckle, LPN Outcome: Adequate for Discharge Goal: Ability to communicate needs accurately will improve 03/23/2023 1822 by Olena Heckle, LPN Outcome: Progressing 03/23/2023 1652 by Olena Heckle, LPN Outcome: Adequate for Discharge   Problem: Nutrition: Goal: Risk of aspiration will decrease 03/23/2023 1822 by Olena Heckle, LPN Outcome: Progressing 03/23/2023 1652 by Olena Heckle, LPN Outcome: Adequate for Discharge Goal: Dietary intake will improve 03/23/2023 1822 by Olena Heckle, LPN Outcome: Progressing 03/23/2023 1652 by Olena Heckle, LPN Outcome: Adequate for Discharge   Problem: Education: Goal: Knowledge of General Education information will improve Description: Including pain rating scale, medication(s)/side effects and non-pharmacologic comfort measures 03/23/2023 1822 by Olena Heckle, LPN Outcome: Progressing 03/23/2023 1652 by Olena Heckle, LPN Outcome: Adequate for Discharge   Problem: Health Behavior/Discharge Planning: Goal: Ability to manage health-related needs will improve 03/23/2023 1822 by Olena Heckle, LPN Outcome: Progressing 03/23/2023 1652 by Olena Heckle, LPN Outcome: Adequate for Discharge   Problem: Clinical Measurements: Goal: Ability to maintain  clinical measurements within normal limits will improve 03/23/2023 1822 by Olena Heckle, LPN Outcome: Progressing 03/23/2023 1652 by Olena Heckle, LPN Outcome: Adequate for Discharge Goal: Will remain free from infection 03/23/2023 1822 by Olena Heckle, LPN Outcome: Progressing 03/23/2023 1652 by Olena Heckle, LPN Outcome: Adequate for Discharge Goal: Diagnostic test results will improve 03/23/2023 1822 by Olena Heckle, LPN Outcome: Progressing 03/23/2023 1652 by Olena Heckle, LPN Outcome: Adequate for Discharge Goal: Respiratory complications will improve 03/23/2023 1822 by Olena Heckle, LPN Outcome: Progressing 03/23/2023 1652 by Olena Heckle, LPN Outcome: Adequate for Discharge Goal: Cardiovascular complication will be avoided 03/23/2023 1822 by Olena Heckle, LPN Outcome: Progressing 03/23/2023 1652 by Olena Heckle, LPN Outcome: Adequate for Discharge   Problem: Activity: Goal: Risk for activity intolerance will decrease 03/23/2023 1822 by Olena Heckle, LPN Outcome: Progressing 03/23/2023 1652 by Olena Heckle, LPN Outcome: Adequate for Discharge   Problem: Nutrition: Goal: Adequate nutrition will be maintained 03/23/2023 1822 by Olena Heckle, LPN Outcome: Progressing 03/23/2023 1652 by Olena Heckle, LPN Outcome: Adequate for Discharge   Problem: Coping: Goal: Level of anxiety will decrease 03/23/2023 1822 by Olena Heckle, LPN Outcome: Progressing 03/23/2023 1652 by Olena Heckle, LPN Outcome: Adequate for Discharge   Problem: Elimination: Goal: Will not experience complications related to bowel motility 03/23/2023 1822 by Olena Heckle, LPN Outcome: Progressing 03/23/2023 1652 by Olena Heckle, LPN Outcome: Adequate for Discharge Goal: Will not experience complications related to urinary retention 03/23/2023 1822 by Olena Heckle, LPN Outcome: Progressing 03/23/2023 1652 by Olena Heckle, LPN Outcome: Adequate for Discharge   Problem: Pain  Managment: Goal: General experience of comfort will improve 03/23/2023 1822 by Olena Heckle, LPN Outcome: Progressing 03/23/2023 1652 by Olena Heckle, LPN Outcome: Adequate for Discharge   Problem: Safety: Goal: Ability to remain free from injury will improve 03/23/2023 1822 by Olena Heckle, LPN Outcome: Progressing 03/23/2023 1652 by Olena Heckle, LPN Outcome: Adequate for Discharge   Problem: Skin Integrity: Goal: Risk for impaired skin integrity will decrease 03/23/2023 1822 by Olena Heckle, LPN Outcome: Progressing 03/23/2023 1652 by Olena Heckle, LPN Outcome: Adequate for Discharge

## 2023-03-23 NOTE — Progress Notes (Signed)
  Echocardiogram Echocardiogram Transesophageal has been performed.  Karen Bennett 03/23/2023, 3:13 PM

## 2023-03-23 NOTE — Transfer of Care (Signed)
Immediate Anesthesia Transfer of Care Note  Patient: Karen Bennett  Procedure(s) Performed: TRANSESOPHAGEAL ECHOCARDIOGRAM  Patient Location: Cath Lab  Anesthesia Type:MAC  Level of Consciousness: awake, alert , oriented, patient cooperative, and responds to stimulation  Airway & Oxygen Therapy: Patient Spontanous Breathing and Patient connected to nasal cannula oxygen  Post-op Assessment: Report given to RN and Post -op Vital signs reviewed and stable  Post vital signs: Reviewed and stable  Last Vitals:  Vitals Value Taken Time  BP 127/57 03/23/23 1446  Temp 36.6 C 03/23/23 1446  Pulse 70 03/23/23 1446  Resp 16 03/23/23 1446  SpO2 93 % 03/23/23 1446    Last Pain:  Vitals:   03/23/23 1446  TempSrc: Temporal  PainSc: 0-No pain      Patients Stated Pain Goal: 0 (03/23/23 1000)  Complications: No notable events documented.

## 2023-03-23 NOTE — Plan of Care (Signed)

## 2023-03-23 NOTE — Discharge Summary (Signed)
Physician Discharge Summary  [name] NWG:[id number] DOB: 07-13-1976 DOA: 03/21/2023  PCP: [name], NP  Admit date: 03/21/2023 Discharge date: 03/23/2023  Time spent: 45 minutes  Recommendations for Outpatient Follow-up:  Needs outpatient follow-up for PFO closure--follow-up with neurology service line as per them Needs glycemic control improved and freestyle libre ordered--needs outpatient titration of meds Resumed statin Needs close follow-up with PCP  Discharge Diagnoses:  MAIN problem for hospitalization   Right inferior cerebellar ischemic infarct positive PFO Prior recent PE HFpEF LDL 238 hyper lipidemia A1c 10 uncontrolled diabetes mellitus Super morbid obesity BMI 55  Please see below for itemized issues addressed in [facility]- refer to other progress notes for clarity if needed  Discharge Condition: Fair  Diet recommendation: Diabetic  Filed Weights   03/21/23 1700 03/21/23 1833  Weight: (!) 164 kg (!) 164 kg    History of present illness:  47.fem hypothyroid normocytic anemia HLD DM TY 2 CKD 3A at baseline chronic right leg radiculopathy HFpEF with pulmonary hypertension hypothyroid Known bradycardia with sinus pauses BMI 58 super morbid obesity prior complication of lower extremity cellulitis--- previous history of osteomyelitis fourth/fifth L metatarsal underwent hydrotherapy back in 2014 for this and has some history of 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 Has prior history of bacteremia with Candida Acinetobacter E faecalis-had a bacteremia previously in 2022 with confirmed mitral valve endocarditis at that time Acute PE 01/24/2023 with prior DVT supposed to be on Xarelto Recent hospitalization 7 19 through 02/23/2023 right leg pain lumbar radiculopathy recommended to follow-up outpatient orthopedics neurosurgery-had fluid overload drinking too much fluid was diuresed   Went to establish care 03/21/2023 started to hyperventilate developed word salad  dissociation bilateral weakness-had only been taking "Xarelto and fluid pill"-strong family history of agoraphobia              Labs significant sodium 130 potassium 3.4 bicarb 17 gap 13 creatinine 1.3 EtOH less than 10 UA UDS pending A1c 8.2 CT head no evidence of acute infarct CTA neck no large vessel occlusion 40% R ICA stenosis severe left external carotid stenosis 8/29 MRI brain: Small focus of ischemia medial inferior right cerebellar hemisphere multifocal hyperintense signal white matter              Transcranial Doppler positive for PFO  Hospital Course:  Aphasia word salad Workup performed inclusive of bubble study echo showing a positive PFO Because of recent pulmonary embolism 01/24/2023 will need at least 9 to 12 months of uninterrupted anticoagulation and then consideration for PF closure Will be going home on Eliquis 5 twice daily at discharge  Mild hyponatremia HFpEF with pleural effusions Improved-Home Lasix 20 q. OD has been held initial pain was resumed subsequently   CKD 3 AA at baseline Creatinine ranges anywhere from 1.9-1.3 Monitor trends   DM TY 2 A1c about 10 12/2023 Tests blood sugar but is not on any blood sugar specific medication It appears we will get freestyle libre as this has been approved--Order # [id number] has been prescribed Continue long-acting insulin at home called in prescription for the same   Super morbid obesity BMI >55 Life-threatening   Prior BKA ? 2018 Dr. Farris Has side with prior bacteremia Prior left foot toe surgeries by Dr. Lajoyce Corners Needs to prevent complications with diabetes etc. Pain control oxycodone 10 every 12, Robaxin 500 twice daily She has been educated   DVT/PE history on Xarelto See above discussion regarding 9 months of Eliquis anticoagulation   Agoraphobia underlying psychiatric illness  NOS Continue bupropion if taking at home   Discharge Exam: Vitals:   03/23/23 1513 03/23/23 1515  BP:  (!) 154/73  Pulse:  71   Resp:  16  Temp: (!) 97.5 F (36.4 C)   SpO2:  94%    Subj on day of d/c   Awake coherent sitting up in bed asking to eat asking to go home  General Exam on discharge  Thick neck Mallampati 4 Chest clear S1-S2 no murmur Abdomen obese precluding any discernible type of evaluation No lower extremity rash-pitting edema slight  Discharge Instructions   Discharge Instructions     Ambulatory referral to Neurology   Complete by: As directed    An appointment is requested in approximately: 8 weeks   Diet - low sodium heart healthy   Complete by: As directed    Discharge instructions   Complete by: As directed    This hospital stay you were diagnosed with a small stroke likely secondary to a hole in your heart called PFO (patent foramen ovale) this requires potential closure but cannot be done emergently and you will need close follow-up with [facility] neurology Dr. Pearlean Brownie Because you were on a blood thinner previously you should be familiar with Eliquis which she will need to take twice a day until you can get the PFO closed I would recommend that you use your insulin low-dose 10 units at bedtime and use the glucometer that was ordered for you so that you can keep a close watch on your sugars as one of the risk factors for strokes is diabetes which is uncontrolled (in addition to blood pressure etc. etc. as well as smoking) Best of luck with everything-monitor sugars, make sure you follow-up with neurology   Increase activity slowly   Complete by: As directed       Allergies as of 03/23/2023       Reactions   Clindamycin Diarrhea, Nausea And Vomiting   Zofran Itching, Nausea And Vomiting   Doxycycline Diarrhea, Nausea And Vomiting   Morphine And Codeine Hives, Itching, Rash        Medication List     STOP taking these medications    buPROPion ER 100 MG 12 hr tablet Commonly known as: WELLBUTRIN SR   methocarbamol 500 MG tablet Commonly known as: ROBAXIN    promethazine 25 MG tablet Commonly known as: PHENERGAN   Xarelto 20 MG Tabs tablet Generic drug: rivaroxaban       TAKE these medications    acetaminophen 500 MG tablet Commonly known as: TYLENOL Take 500 mg by mouth every 6 (six) hours as needed for moderate pain.   apixaban 5 MG Tabs tablet Commonly known as: ELIQUIS Take 1 tablet (5 mg total) by mouth 2 (two) times daily.   atorvastatin 40 MG tablet Commonly known as: LIPITOR Take 1 tablet (40 mg total) by mouth daily at 6 PM.   FreeStyle Libre 3 Sensor Misc Place 1 sensor on the skin every 14 days. Use to check glucose continuously   furosemide 20 MG tablet Commonly known as: Lasix Take 1 tablet (20 mg total) by mouth every other day.   glucose blood test strip Use as instructed   glucose monitoring kit monitoring kit 1 each by Does not apply route as needed for other.   levothyroxine 50 MCG tablet Commonly known as: SYNTHROID Take 1 tablet (50 mcg total) by mouth daily at 6 (six) AM.   Oxycodone HCl 10 MG Tabs Take 1 tablet (10  mg total) by mouth every 12 (twelve) hours as needed.   Semglee (yfgn) 100 UNIT/ML Pen Generic drug: insulin glargine Inject 10 Units into the skin at bedtime.       Allergies  Allergen Reactions   Clindamycin Diarrhea and Nausea And Vomiting   Zofran Itching and Nausea And Vomiting   Doxycycline Diarrhea and Nausea And Vomiting   Morphine And Codeine Hives, Itching and Rash    Follow-up Information     [facility] Follow up.   Why: [facility] will be providing home health services.                 The results of significant diagnostics from this hospitalization (including imaging, microbiology, ancillary and laboratory) are listed below for reference.    Significant Diagnostic Studies: ECHO TEE  Result Date: 03/23/2023    TRANSESOPHOGEAL ECHO REPORT   Patient Name:   Karen Bennett Date of Exam: 03/23/2023 Medical Rec #:  [id number]        Height:       66.0 in Accession #:    [id number]      Weight:       361.6 lb Date of Birth:  1975/11/11      BSA:          2.572 m Patient Age:    46 years        BP:           154/73 mmHg Patient Gender: F               HR:           61 bpm. Exam Location:  Inpatient Procedure: Transesophageal Echo, Cardiac Doppler and Color Doppler Indications:     Stroke  History:         Patient has prior history of Echocardiogram examinations, most                  recent 03/22/2023. Abnormal ECG, Endocarditis,                  Signs/Symptoms:Bacteremia; Risk Factors:Diabetes, Hypertension                  and Current Smoker. Pulmonary embolus.  Sonographer:     Sheralyn Boatman RDCS Referring Phys:  [id number] Corrin Parker Diagnosing Phys: Epifanio Lesches MD PROCEDURE: After discussion of the risks and benefits of a TEE, an informed consent was obtained from the patient. The transesophogeal probe was passed without difficulty through the esophogus of the patient. Imaged were obtained with the patient in a left lateral decubitus position. Sedation performed by different physician. The patient was monitored while under deep sedation. Anesthestetic sedation was provided intravenously by Anesthesiology: 283mg  of Propofol, 20mg  of Lidocaine. The patient's vital signs; including heart rate, blood pressure, and oxygen saturation; remained stable throughout the procedure. The patient developed no complications during the procedure.  IMPRESSIONS  1. Left ventricular ejection fraction, by estimation, is 45 to 50%. The left ventricle has mildly decreased function. There is the interventricular septum is flattened in systole, consistent with right ventricular pressure overload.  2. Right ventricular systolic function is mildly reduced. The right ventricular size is mildly enlarged.  3. Left atrial size was mildly dilated. No left atrial/left atrial appendage thrombus was detected.  4. Right atrial size was mildly dilated.  5. The mitral  valve is normal in structure. Mild mitral valve regurgitation.  6. The aortic valve is tricuspid. Aortic valve  regurgitation is trivial. Aortic valve sclerosis/calcification is present, without any evidence of aortic stenosis.  7. Agitated saline contrast bubble study was positive with shunting observed within 3-6 cardiac cycles suggestive of interatrial shunt. FINDINGS  Left Ventricle: Left ventricular ejection fraction, by estimation, is 45 to 50%. The left ventricle has mildly decreased function. The left ventricular internal cavity size was normal in size. The interventricular septum is flattened in systole, consistent with right ventricular pressure overload. Right Ventricle: The right ventricular size is mildly enlarged. No increase in right ventricular wall thickness. Right ventricular systolic function is mildly reduced. Left Atrium: Left atrial size was mildly dilated. No left atrial/left atrial appendage thrombus was detected. Right Atrium: Right atrial size was mildly dilated. Pericardium: There is no evidence of pericardial effusion. Mitral Valve: The mitral valve is normal in structure. Mild mitral valve regurgitation. Tricuspid Valve: The tricuspid valve is normal in structure. Tricuspid valve regurgitation is mild . No evidence of tricuspid stenosis. Aortic Valve: The aortic valve is tricuspid. Aortic valve regurgitation is trivial. Aortic valve sclerosis/calcification is present, without any evidence of aortic stenosis. Aortic valve mean gradient measures 6.0 mmHg. Aortic valve peak gradient measures 11.7 mmHg. Pulmonic Valve: The pulmonic valve was grossly normal. Pulmonic valve regurgitation is trivial. No evidence of pulmonic stenosis. Aorta: The aortic root and ascending aorta are structurally normal, with no evidence of dilitation. IAS/Shunts: No atrial level shunt detected by color flow Doppler. Agitated saline contrast bubble study was positive with shunting observed within 3-6 cardiac cycles  suggestive of interatrial shunt. Additional Comments: Spectral Doppler performed. AORTIC VALVE AV Vmax:      171.00 cm/s AV Vmean:     120.000 cm/s AV VTI:       0.338 m AV Peak Grad: 11.7 mmHg AV Mean Grad: 6.0 mmHg  AORTA Ao Asc diam: 3.70 cm Epifanio Lesches MD Electronically signed by Epifanio Lesches MD Signature Date/Time: 03/23/2023/3:35:06 PM    Final    VAS [place] TRANSCRANIAL DOPPLER W BUBBLES  Result Date: 03/23/2023  Transcranial Doppler with Bubble Patient Name:  Karen Bennett  Date of Exam:   03/22/2023 Medical Rec #: [id number]        Accession #:    [id number] Date of Birth: [birth year]       Patient Gender: F Patient Age:   33 years Exam Location:  [facility] Procedure:      VAS [place] TRANSCRANIAL DOPPLER W BUBBLES Referring Phys: Elmer Picker --------------------------------------------------------------------------------  Indications: Stroke. History: History of DVT/PE. Limitations for diagnostic windows: Unable to insonate right transtemporal window. Unable to insonate left transtemporal window. Performing Technologist: Jean Rosenthal RDMS, RVT  Examination Guidelines: A complete evaluation includes B-mode imaging, spectral Doppler, color Doppler, and power Doppler as needed of all accessible portions of each vessel. Bilateral testing is considered an integral part of a complete examination. Limited examinations for reoccurring indications may be performed as noted.  Summary:  A vascular evaluation was performed. The right carotid siphon and the left vertebral arteries were studied. An IV was inserted into the patient's right antecubital. Verbal informed consent was obtained.  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 *See table(s) above for TCD measurements and observations.  Diagnosing  physician: Delia Heady MD Electronically signed by Delia Heady MD on 03/23/2023 at 3:09:19 PM.    Final  ECHOCARDIOGRAM LIMITED BUBBLE STUDY  Result Date: 03/22/2023    ECHOCARDIOGRAM LIMITED REPORT   Patient Name:   Karen Bennett Date of Exam: 03/22/2023 Medical Rec #:  [id number]       Height:       66.0 in Accession #:    [id number]      Weight:       361.6 lb Date of Birth:  [birth year]      BSA:          2.572 m Patient Age:    46 years        BP:           110/67 mmHg Patient Gender: F               HR:           72 bpm. Exam Location:  Inpatient Procedure: Limited Echo, Cardiac Doppler, Limited Color Doppler, Intracardiac            Opacification Agent and Saline Contrast Bubble Study Indications:    Stroke 434.91 / I63.9  History:        Patient has prior history of Echocardiogram examinations, most                 recent 01/12/2023. Risk Factors:Diabetes, Hypertension,                 Non-Smoker and Dyslipidemia.  Sonographer:    Aron Baba Referring Phys: [id number] DGUYQIHKV RATHORE  Sonographer Comments: Patient is obese and Technically difficult study due to poor echo windows. Image acquisition challenging due to respiratory motion. IMPRESSIONS  1. Left ventricular ejection fraction, by estimation, is 45%. The left ventricle has mildly decreased function. The left ventricle demonstrates regional wall motion abnormalities (see scoring diagram/findings for description) - apical hypokinesis which is new since prior echo. No LV apical thrombu seen.  2. With valsalva, there are scant late bubbles present (>6 cardiac cycles) suggesting small intrapulmonary shunt. Agitated saline contrast bubble study was negative for interatrial shunt.  3. The mitral valve is abnormal. Mild mitral valve regurgitation.  4. Right ventricular systolic function is normal. The right ventricular size is normal. Tricuspid regurgitation signal is inadequate for assessing PA pressure.  5. The inferior vena cava is normal in size  with <50% respiratory variability, suggesting right atrial pressure of 8 mmHg. FINDINGS  Left Ventricle: Left ventricular ejection fraction, by estimation, is 45%. The left ventricle has mildly decreased function. The left ventricle demonstrates regional wall motion abnormalities. Definity contrast agent was given IV to delineate the left ventricular endocardial borders.  LV Wall Scoring: The apical lateral segment, apical septal segment, apical anterior segment, and apical inferior segment are hypokinetic. Right Ventricle: The right ventricular size is normal. Right ventricular systolic function is normal. Tricuspid regurgitation signal is inadequate for assessing PA pressure. Mitral Valve: The mitral valve is abnormal. There is mild calcification of the mitral valve leaflet(s). Mild mitral annular calcification. Mild mitral valve regurgitation. Tricuspid Valve: The tricuspid valve is grossly normal. Tricuspid valve regurgitation is trivial. Venous: The inferior vena cava is normal in size with less than 50% respiratory variability, suggesting right atrial pressure of 8 mmHg. IAS/Shunts: No atrial level shunt detected by color flow Doppler. Agitated saline contrast was given intravenously to evaluate for intracardiac shunting. Agitated saline contrast bubble study was negative, with no evidence of any interatrial shunt. Additional Comments: Spectral Doppler performed. Color Doppler performed.  LEFT VENTRICLE PLAX 2D LVIDd:  5.60 cm LVIDs:         4.30 cm LV PW:         1.30 cm LV IVS:        0.80 cm  LEFT ATRIUM         Index LA diam:    4.90 cm 1.91 cm/m  MITRAL VALVE MV Area (PHT): 3.42 cm MV Decel Time: 222 msec MV E velocity: 107.00 cm/s MV A velocity: 109.00 cm/s MV E/A ratio:  0.98 Weston Brass MD Electronically signed by Weston Brass MD Signature Date/Time: 03/22/2023/12:30:22 PM    Final    MR Brain Wo Contrast (neuro protocol)  Result Date: 03/21/2023 CLINICAL DATA:  Aphasia EXAM: MRI  HEAD WITHOUT CONTRAST TECHNIQUE: Multiplanar, multiecho pulse sequences of the brain and surrounding structures were obtained without intravenous contrast. COMPARISON:  None Available. FINDINGS: Brain: There is a small focus of abnormal diffusion restriction within the medial inferior right cerebellar hemisphere. No acute or chronic hemorrhage. There is multifocal hyperintense T2-weighted signal within the white matter. Parenchymal volume and CSF spaces are normal. The midline structures are normal. Vascular: Major flow voids are preserved. Skull and upper cervical spine: Normal calvarium and skull base. Visualized upper cervical spine and soft tissues are normal. Sinuses/Orbits:No paranasal sinus fluid levels or advanced mucosal thickening. No mastoid or middle ear effusion. Normal orbits. IMPRESSION: 1. Small focus of acute ischemia within the medial inferior right cerebellar hemisphere. No hemorrhage or mass effect. 2. Multifocal hyperintense T2-weighted signal within the white matter, nonspecific but most commonly seen in the setting of chronic microvascular ischemia. Electronically Signed   By: Deatra Robinson M.D.   On: 03/21/2023 23:29   CT ANGIO HEAD NECK W WO CM (CODE STROKE)  Result Date: 03/21/2023 CLINICAL DATA:  Neuro deficit, acute, stroke suspected.  Aphasia EXAM: CT ANGIOGRAPHY HEAD AND NECK WITH AND WITHOUT CONTRAST TECHNIQUE: Multidetector CT imaging of the head and neck was performed using the standard protocol during bolus administration of intravenous contrast. Multiplanar CT image reconstructions and MIPs were obtained to evaluate the vascular anatomy. Carotid stenosis measurements (when applicable) are obtained utilizing NASCET criteria, using the distal internal carotid diameter as the denominator. RADIATION DOSE REDUCTION: This exam was performed according to the departmental dose-optimization program which includes automated exposure control, adjustment of the mA and/or kV according to  patient size and/or use of iterative reconstruction technique. CONTRAST:  OMNIPAQUE IOHEXOL 350 MG/ML SOLN COMPARISON:  None Available. FINDINGS: CTA NECK FINDINGS Aortic arch: Standard 3 vessel aortic arch with mild atherosclerotic calcification. No significant stenosis of the arch vessel origins. Right carotid system: Patent with a moderately large amount of predominantly calcified plaque about the carotid bifurcation resulting in 40% stenosis of the ICA origin. Left carotid system: Patent with a moderately large amount of predominantly calcified plaque about the carotid bifurcation resulting in severe stenosis of the proximal external carotid artery but no significant stenosis of the common or internal carotid arteries. Vertebral arteries: Patent without evidence of significant stenosis or dissection. Strongly dominant left vertebral artery. Skeleton: Mild cervical spondylosis. Other neck: No evidence of cervical lymphadenopathy or mass. Upper chest: Partially visualized small pleural effusions, right larger than left. Nonspecific mosaic attenuation in the lungs bilaterally. Coronary atherosclerosis. Review of the MIP images confirms the above findings CTA HEAD FINDINGS Anterior circulation: The internal carotid arteries are patent from skull base to carotid termini with mild-to-moderate atherosclerotic calcification bilaterally not resulting in significant stenosis. ACAs and MCAs are patent without evidence of a  proximal branch occlusion or significant proximal stenosis. No aneurysm is identified. Posterior circulation: The intracranial vertebral arteries are patent with the left supplying the basilar in the right terminating in PICA. Left V4 segment atherosclerosis results in mild stenosis. Patent PICA and SCA origins are visualized bilaterally. The basilar artery is widely patent. Posterior communicating arteries are diminutive or absent. The PCAs are patent with asymmetric right-sided branch vessel  irregularity but no evidence of a flow limiting proximal stenosis. No aneurysm is identified. Venous sinuses: As permitted by contrast timing, patent. Anatomic variants: Hypoplastic right vertebral artery terminating in PICA. Review of the MIP images confirms the above findings IMPRESSION: 1. No large vessel occlusion. 2. Age advanced atherosclerosis in the head and neck. No flow limiting proximal intracranial stenosis. 3. 40% right ICA origin stenosis. 4. Severe left external carotid artery origin stenosis. 5. Small pleural effusions. 6.  Aortic Atherosclerosis (ICD10-I70.0). Electronically Signed   By: Sebastian Ache M.D.   On: 03/21/2023 18:33   CT HEAD CODE STROKE WO CONTRAST  Result Date: 03/21/2023 CLINICAL DATA:  Code stroke. Neuro deficit, acute, stroke suspected. Aphasia. EXAM: CT HEAD WITHOUT CONTRAST TECHNIQUE: Contiguous axial images were obtained from the base of the skull through the vertex without intravenous contrast. RADIATION DOSE REDUCTION: This exam was performed according to the departmental dose-optimization program which includes automated exposure control, adjustment of the mA and/or kV according to patient size and/or use of iterative reconstruction technique. COMPARISON:  None Available. FINDINGS: Brain: Small hypodensities in the white matter adjacent to the frontal horn of the right lateral ventricle are nonspecific but most resemble small-vessel ischemia, including a discrete, chronic appearing lacunar infarct anteriorly. The ventricles are normal in size. Vascular: Age advanced atherosclerosis at the skull base. Skull: No acute fracture or suspicious osseous lesion. Sinuses/Orbits: Visualized paranasal sinuses and mastoid air cells are clear. Unremarkable orbits. Other: None. ASPECTS ([place] Stroke Program Early CT Score) - Ganglionic level infarction (caudate, lentiform nuclei, internal capsule, insula, M1-M3 cortex): 7 - Supraganglionic infarction (M4-M6 cortex): 3 Total score  (0-10 with 10 being normal): 10 These results were communicated to Dr. Derry Lory at 6:07 pm on 03/21/2023 by text page via the [facility] messaging system. IMPRESSION: 1. No evidence of acute cortical infarct or intracranial hemorrhage. ASPECTS of 10. 2. Chronic small-vessel type changes in the right frontal periventricular white matter. 3. Age advanced atherosclerosis. Electronically Signed   By: Sebastian Ache M.D.   On: 03/21/2023 18:08   MR LUMBAR SPINE WO CONTRAST  Result Date: 02/23/2023 CLINICAL DATA:  Initial evaluation for spinal stenosis. EXAM: MRI LUMBAR SPINE WITHOUT CONTRAST TECHNIQUE: Multiplanar, multisequence MR imaging of the lumbar spine was performed. No intravenous contrast was administered. COMPARISON:  Prior CT from 02/09/2023. FINDINGS: Segmentation: Standard. Lowest well-formed disc space labeled the L5-S1 level. Alignment: Chronic bilateral pars defects at L5 with associated 7 mm spondylolisthesis of L5 on S1. Trace 3 mm retrolisthesis of T11 on T12, with 2 mm retrolisthesis of L2 on L3. Vertebrae: Vertebral body height maintained with no other acute or chronic fracture. Underlying bone marrow signal intensity within normal limits. No worrisome osseous lesions. Mild reactive endplate change about the L5-S1 interspace. No other significant abnormal marrow edema. Conus medullaris and cauda equina: Conus extends to the L1-2 level. Conus and cauda equina appear normal. Paraspinal and other soft tissues: Diffuse edema throughout the subcutaneous fat of the lower back, nonspecific, but suspected to be related to overall volume status. Unilateral right renal atrophy. Subcentimeter right renal cyst noted, benign  in appearance, no follow-up imaging recommended. Disc levels: T11-12: Seen only on sagittal projection. Trace retrolisthesis. Disc desiccation with diffuse disc bulge. Left worse than right facet hypertrophy. No significant spinal stenosis. Moderate bilateral foraminal narrowing. T12-L1: Normal  interspace.  Mild facet hypertrophy.  No stenosis. L1-2: Normal interspace. Mild facet hypertrophy. No spinal stenosis. Foramina remain patent. L2-3: Trace retrolisthesis. Disc desiccation with diffuse disc bulge. Superimposed small right foraminal disc protrusion closely approximates the exiting right L2 nerve root (series 5, image 7). Right worse than left facet hypertrophy. Resultant mild canal with moderate right lateral recess stenosis. Moderate right with mild left L2 foraminal narrowing. L3-4: Disc desiccation with mild disc bulge. Superimposed small right foraminal to extraforaminal disc protrusion closely approximates the exiting right L3 nerve root (series 9, image 23). Mild facet hypertrophy. Resultant mild spinal stenosis. Moderate right with mild left L3 foraminal narrowing. L4-5: Minimal annular disc bulge. Moderate bilateral facet arthrosis. No significant spinal stenosis. Mild to moderate bilateral L4 foraminal narrowing. L5-S1: Chronic bilateral pars defects with 7 mm spondylolisthesis. Advanced degenerative intervertebral disc space narrowing with disc desiccation and broad posterior pseudo disc bulge/uncovering. Associated reactive endplate spurring. No significant spinal stenosis. Severe bilateral L5 foraminal narrowing, largely due to slippage. IMPRESSION: 1. 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 2. Disc bulging with small right-sided disc protrusions and facet hypertrophy at L2-3 and L3-4, resulting in mild canal with moderate right lateral recess stenosis, with moderate right L2 and L3 foraminal narrowing. 3. Moderate facet hypertrophy at L4-5 with resultant mild to moderate bilateral L4 foraminal stenosis. 4. Asymmetric right renal atrophy. Electronically Signed   By: Rise Mu M.D.   On: 02/23/2023 03:39    Microbiology: No results found for this or any previous visit (from  the past 240 hour(s)).   Labs: Basic Metabolic Panel: Recent Labs  Lab 03/21/23 1749 03/21/23 1750 03/21/23 2144 03/22/23 0416  NA 133* 130*  --  132*  K 3.5 3.4*  --  3.5  CL 103 100  --  104  CO2  --  17*  --  22  GLUCOSE 270* 271*  --  146*  BUN 15 13  --  14  CREATININE 1.30* 1.33*  --  1.71*  CALCIUM  --  8.6*  --  8.4*  MG  --   --  1.5*  --    Liver Function Tests: Recent Labs  Lab 03/21/23 1750  AST 11*  ALT 11  ALKPHOS 65  BILITOT 1.0  PROT 6.4*  ALBUMIN 1.9*   Recent Labs  Lab 03/21/23 2144  LIPASE 23   No results for input(s): "AMMONIA" in the last 168 hours. CBC: Recent Labs  Lab 03/21/23 1749 03/21/23 1750  WBC  --  7.2  NEUTROABS  --  4.4  HGB 14.3 14.1  HCT 42.0 42.9  MCV  --  83.3  PLT  --  277   Cardiac Enzymes: No results for input(s): "CKTOTAL", "CKMB", "CKMBINDEX", "TROPONINI" in the last 168 hours. BNP: BNP (last 3 results) Recent Labs    01/11/23 2346 01/24/23 2225 02/09/23 1736  BNP 227.9* 321.0* 345.1*    ProBNP (last 3 results) No results for input(s): "PROBNP" in the last 8760 hours.  CBG: Recent Labs  Lab 03/23/23 0030 03/23/23 0346 03/23/23 0757 03/23/23 1156 03/23/23 1459  GLUCAP 109* 82 95 93 102*       Signed:  Jai-Gurmukh  Marybella Ethier MD   [facility] 03/23/2023, 4:18 PM

## 2023-03-23 NOTE — TOC Progression Note (Signed)
Transition of Care [facility]) - Progression Note    Patient Details  Name: Karen Bennett MRN: [id number] Date of Birth: 10/25/1975  Transition of Care [facility]) CM/SW Contact  Janae Bridgeman, RN Phone Number: 03/23/2023, 12:23 PM  Clinical Narrative:    CM met with the patient at the bedside and provided her with Medicare Observation notice.  I discussed with her regarding her goals to return home based on her progress with therapy yesterday and the patient would like to discharge home when medically stable and did not want to go to a nursing facility for rehabilitation.  The patient is active with Personal care services 5 days a week for 5-6 hours a day and currently has an Mining engineer wheelchair.  The patient states that her nursing assistance picked up her electric wheelchair from the PCP yesterday and delivered it back home to her apartment.  The patient was provided with Medicare choice regarding home health agency and she did not have a preference.  I called Bjorn Loser, RNCM with [facility] and requested PT, OT and Speech for [facility] services - will wait to hear back for acceptance.   Expected Discharge Plan: Home w Home Health Services Barriers to Discharge: Continued Medical Work up  Expected Discharge Plan and Services   Discharge Planning Services: CM Consult Post Acute Care Choice: Resumption of Svcs/PTA Provider Living arrangements for the past 2 months: Apartment                           HH Arranged: Nurse's Aide (Patient has personal care services through [facility])           Social Determinants of Health (SDOH) Interventions SDOH Screenings   Food Insecurity: No Food Insecurity (03/22/2023)  Recent Concern: Food Insecurity - Food Insecurity Present (03/21/2023)   Received from [facility]  Housing: Patient Declined (03/22/2023)  Transportation Needs: No Transportation Needs (03/22/2023)  Recent Concern: Transportation Needs - Unmet  Transportation Needs (03/21/2023)   Received from [facility]  Utilities: Not At Risk (03/22/2023)  Financial Resource Strain: High Risk (03/21/2023)   Received from [facility]  Physical Activity: Inactive (03/30/2021)   Received from [facility], [facility]  Social Connections: Unknown (11/24/2021)   Received from [facility], [facility]  Stress: Stress Concern Present (03/30/2021)   Received from [facility], [facility]  Tobacco Use: Low Risk  (03/22/2023)  Recent Concern: Tobacco Use - High Risk (03/21/2023)   Received from [facility]    Readmission Risk Interventions    02/13/2023   11:23 AM 01/26/2023    3:33 PM 01/16/2023   11:06 AM  Readmission Risk Prevention Plan  Transportation Screening Complete Complete Complete  PCP or Specialist Appt within 5-7 Days   Complete  PCP or Specialist Appt within 3-5 Days Complete Complete   Home Care Screening   Complete  Medication Review (RN CM)   Complete  HRI or Home Care Consult Complete Complete   Social Work Consult for Recovery Care Planning/Counseling Complete Complete   Palliative Care Screening Complete Not Applicable   Medication Review Oceanographer) Complete Complete

## 2023-03-23 NOTE — Discharge Instructions (Signed)
Information on my medicine - ELIQUIS® (apixaban) ° °Why was Eliquis® prescribed for you? °Eliquis® was prescribed for you to reduce the risk of forming blood clots that can cause a stroke if you have a medical condition called atrial fibrillation (a type of irregular heartbeat) OR to reduce the risk of a blood clots forming after orthopedic surgery. ° °What do You need to know about Eliquis® ? °Take your Eliquis® TWICE DAILY - one tablet in the morning and one tablet in the evening with or without food.  It would be best to take the doses about the same time each day. ° °If you have difficulty swallowing the tablet whole please discuss with your pharmacist how to take the medication safely. ° °Take Eliquis® exactly as prescribed by your doctor and DO NOT stop taking Eliquis® without talking to the doctor who prescribed the medication.  Stopping may increase your risk of developing a new clot or stroke.  Refill your prescription before you run out. ° °After discharge, you should have regular check-up appointments with your healthcare provider that is prescribing your Eliquis®.  In the future your dose may need to be changed if your kidney function or weight changes by a significant amount or as you get older. ° °What do you do if you miss a dose? °If you miss a dose, take it as soon as you remember on the same day and resume taking twice daily.  Do not take more than one dose of ELIQUIS at the same time. ° °Important Safety Information °A possible side effect of Eliquis® is bleeding. You should call your healthcare provider right away if you experience any of the following: °? Bleeding from an injury or your nose that does not stop. °? Unusual colored urine (red or dark brown) or unusual colored stools (red or black). °? Unusual bruising for unknown reasons. °? A serious fall or if you hit your head (even if there is no bleeding). ° °Some medicines may interact with Eliquis® and might increase your risk of bleeding  or clotting while on Eliquis®. To help avoid this, consult your healthcare provider or pharmacist prior to using any new prescription or non-prescription medications, including herbals, vitamins, non-steroidal anti-inflammatory drugs (NSAIDs) and supplements. ° °This website has more information on Eliquis® (apixaban): www.Eliquis.com. ° ° ° °

## 2023-03-23 NOTE — CV Procedure (Signed)
     TRANSESOPHAGEAL ECHOCARDIOGRAM   NAME:  Karen Bennett   MRN: [id number] DOB:  29-Apr-1976   ADMIT DATE: 03/21/2023  INDICATIONS: CVA  PROCEDURE:   Informed consent was obtained prior to the procedure. The risks, benefits and alternatives for the procedure were discussed and the patient comprehended these risks.  Risks include, but are not limited to, cough, sore throat, vomiting, nausea, somnolence, esophageal and stomach trauma or perforation, bleeding, low blood pressure, aspiration, pneumonia, infection, trauma to the teeth and death.    After a procedural time-out, the oropharynx was anesthetized and the patient was sedated by the anesthesia service. The transesophageal probe was inserted in the esophagus and stomach without difficulty and multiple views were obtained. Anesthesia was monitored by Earlene Plater, CRNA.    COMPLICATIONS:    There were no immediate complications.  FINDINGS:  Positive bubble study consistent with PFO   Epifanio Lesches MD [facility]  [address], [address] [place], [place] [address] [phone]   2:55 PM

## 2023-03-23 NOTE — Care Management Obs Status (Cosign Needed)
MEDICARE OBSERVATION STATUS NOTIFICATION   Patient Details  Name: Karen Bennett MRN: [id number] Date of Birth: 09/13/1975   Medicare Observation Status Notification Given:  Yes    Janae Bridgeman, RN 03/23/2023, 9:25 AM

## 2023-03-23 NOTE — Plan of Care (Signed)
Problem: Education: Goal: Ability to describe self-care measures that may prevent or decrease complications (Diabetes Survival Skills Education) will improve Outcome: Adequate for Discharge Goal: Individualized Educational Video(s) Outcome: Adequate for Discharge   Problem: Coping: Goal: Ability to adjust to condition or change in health will improve Outcome: Adequate for Discharge   Problem: Fluid Volume: Goal: Ability to maintain a balanced intake and output will improve Outcome: Adequate for Discharge   Problem: Health Behavior/Discharge Planning: Goal: Ability to identify and utilize available resources and services will improve Outcome: Adequate for Discharge Goal: Ability to manage health-related needs will improve Outcome: Adequate for Discharge   Problem: Metabolic: Goal: Ability to maintain appropriate glucose levels will improve Outcome: Adequate for Discharge   Problem: Nutritional: Goal: Maintenance of adequate nutrition will improve Outcome: Adequate for Discharge Goal: Progress toward achieving an optimal weight will improve Outcome: Adequate for Discharge   Problem: Skin Integrity: Goal: Risk for impaired skin integrity will decrease Outcome: Adequate for Discharge   Problem: Tissue Perfusion: Goal: Adequacy of tissue perfusion will improve Outcome: Adequate for Discharge   Problem: Education: Goal: Knowledge of disease or condition will improve Outcome: Adequate for Discharge Goal: Knowledge of secondary prevention will improve (MUST DOCUMENT ALL) Outcome: Adequate for Discharge Goal: Knowledge of patient specific risk factors will improve Elta [place] N/A or DELETE if not current risk factor) Outcome: Adequate for Discharge   Problem: Ischemic Stroke/TIA Tissue Perfusion: Goal: Complications of ischemic stroke/TIA will be minimized Outcome: Adequate for Discharge   Problem: Coping: Goal: Will verbalize positive feelings about self Outcome: Adequate for  Discharge Goal: Will identify appropriate support needs Outcome: Adequate for Discharge   Problem: Health Behavior/Discharge Planning: Goal: Ability to manage health-related needs will improve Outcome: Adequate for Discharge Goal: Goals will be collaboratively established with patient/family Outcome: Adequate for Discharge   Problem: Self-Care: Goal: Ability to participate in self-care as condition permits will improve Outcome: Adequate for Discharge Goal: Verbalization of feelings and concerns over difficulty with self-care will improve Outcome: Adequate for Discharge Goal: Ability to communicate needs accurately will improve Outcome: Adequate for Discharge   Problem: Nutrition: Goal: Risk of aspiration will decrease Outcome: Adequate for Discharge Goal: Dietary intake will improve Outcome: Adequate for Discharge   Problem: Education: Goal: Knowledge of General Education information will improve Description: Including pain rating scale, medication(s)/side effects and non-pharmacologic comfort measures Outcome: Adequate for Discharge   Problem: Health Behavior/Discharge Planning: Goal: Ability to manage health-related needs will improve Outcome: Adequate for Discharge   Problem: Clinical Measurements: Goal: Ability to maintain clinical measurements within normal limits will improve Outcome: Adequate for Discharge Goal: Will remain free from infection Outcome: Adequate for Discharge Goal: Diagnostic test results will improve Outcome: Adequate for Discharge Goal: Respiratory complications will improve Outcome: Adequate for Discharge Goal: Cardiovascular complication will be avoided Outcome: Adequate for Discharge   Problem: Activity: Goal: Risk for activity intolerance will decrease Outcome: Adequate for Discharge   Problem: Nutrition: Goal: Adequate nutrition will be maintained Outcome: Adequate for Discharge   Problem: Coping: Goal: Level of anxiety will  decrease Outcome: Adequate for Discharge   Problem: Elimination: Goal: Will not experience complications related to bowel motility Outcome: Adequate for Discharge Goal: Will not experience complications related to urinary retention Outcome: Adequate for Discharge   Problem: Pain Managment: Goal: General experience of comfort will improve Outcome: Adequate for Discharge   Problem: Safety: Goal: Ability to remain free from injury will improve Outcome: Adequate for Discharge   Problem: Skin Integrity: Goal: Risk for  impaired skin integrity will decrease Outcome: Adequate for Discharge

## 2023-03-23 NOTE — Anesthesia Preprocedure Evaluation (Addendum)
Anesthesia Evaluation  Patient identified by MRN, date of birth, ID band Patient awake    Reviewed: Allergy & Precautions, H&P , NPO status , Patient's Chart, lab work & pertinent test results  Airway Mallampati: II  TM Distance: >3 FB Neck ROM: Full    Dental no notable dental hx. (+) Teeth Intact, Dental Advisory Given   Pulmonary neg pulmonary ROS   Pulmonary exam normal breath sounds clear to auscultation       Cardiovascular hypertension, + Peripheral Vascular Disease   Rhythm:Regular Rate:Normal     Neuro/Psych   Anxiety Depression    CVA, No Residual Symptoms    GI/Hepatic negative GI ROS, Neg liver ROS,,,  Endo/Other  diabetesHypothyroidism  Morbid obesity  Renal/GU negative Renal ROS  negative genitourinary   Musculoskeletal   Abdominal   Peds  Hematology  (+) Blood dyscrasia, anemia   Anesthesia Other Findings   Reproductive/Obstetrics negative OB ROS                             Anesthesia Physical Anesthesia Plan  ASA: 3  Anesthesia Plan: MAC   Post-op Pain Management: Minimal or no pain anticipated   Induction: Intravenous  PONV Risk Score and Plan: 2 and Propofol infusion and Treatment may vary due to age or medical condition  Airway Management Planned: Natural Airway and Nasal Cannula  Additional Equipment:   Intra-op Plan:   Post-operative Plan:   Informed Consent: I have reviewed the patients History and Physical, chart, labs and discussed the procedure including the risks, benefits and alternatives for the proposed anesthesia with the patient or authorized representative who has indicated his/her understanding and acceptance.     Dental advisory given  Plan Discussed with: CRNA  Anesthesia Plan Comments:        Anesthesia Quick Evaluation

## 2023-03-24 ENCOUNTER — Encounter: Payer: Self-pay | Admitting: Family Medicine

## 2023-03-24 ENCOUNTER — Other Ambulatory Visit: Payer: Self-pay | Admitting: Family Medicine

## 2023-03-24 NOTE — Progress Notes (Signed)
 error

## 2023-03-27 ENCOUNTER — Encounter (HOSPITAL_COMMUNITY): Payer: Self-pay | Admitting: Cardiology

## 2023-04-13 ENCOUNTER — Other Ambulatory Visit (HOSPITAL_COMMUNITY): Payer: Self-pay

## 2023-05-04 ENCOUNTER — Other Ambulatory Visit (HOSPITAL_COMMUNITY): Payer: Self-pay

## 2023-08-01 ENCOUNTER — Emergency Department (HOSPITAL_COMMUNITY): Payer: 59

## 2023-08-01 ENCOUNTER — Other Ambulatory Visit: Payer: Self-pay

## 2023-08-01 ENCOUNTER — Inpatient Hospital Stay (HOSPITAL_COMMUNITY)
Admission: EM | Admit: 2023-08-01 | Discharge: 2023-08-21 | DRG: 291 | Disposition: A | Discharge status: Home Health | Payer: 59 | Attending: Internal Medicine | Admitting: Internal Medicine

## 2023-08-01 DIAGNOSIS — Z5320 Procedure and treatment not carried out because of patient's decision for unspecified reasons: Secondary | ICD-10-CM | POA: Diagnosis not present

## 2023-08-01 DIAGNOSIS — F419 Anxiety disorder, unspecified: Secondary | ICD-10-CM | POA: Diagnosis present

## 2023-08-01 DIAGNOSIS — N1831 Chronic kidney disease, stage 3a: Secondary | ICD-10-CM | POA: Diagnosis present

## 2023-08-01 DIAGNOSIS — E1165 Type 2 diabetes mellitus with hyperglycemia: Secondary | ICD-10-CM | POA: Diagnosis present

## 2023-08-01 DIAGNOSIS — E871 Hypo-osmolality and hyponatremia: Secondary | ICD-10-CM | POA: Diagnosis present

## 2023-08-01 DIAGNOSIS — Q2112 Patent foramen ovale: Secondary | ICD-10-CM | POA: Diagnosis not present

## 2023-08-01 DIAGNOSIS — Z5986 Financial insecurity: Secondary | ICD-10-CM

## 2023-08-01 DIAGNOSIS — I081 Rheumatic disorders of both mitral and tricuspid valves: Secondary | ICD-10-CM | POA: Diagnosis present

## 2023-08-01 DIAGNOSIS — Z86711 Personal history of pulmonary embolism: Secondary | ICD-10-CM

## 2023-08-01 DIAGNOSIS — Z602 Problems related to living alone: Secondary | ICD-10-CM | POA: Diagnosis present

## 2023-08-01 DIAGNOSIS — E1122 Type 2 diabetes mellitus with diabetic chronic kidney disease: Secondary | ICD-10-CM | POA: Diagnosis present

## 2023-08-01 DIAGNOSIS — I509 Heart failure, unspecified: Principal | ICD-10-CM

## 2023-08-01 DIAGNOSIS — I452 Bifascicular block: Secondary | ICD-10-CM | POA: Diagnosis present

## 2023-08-01 DIAGNOSIS — E1169 Type 2 diabetes mellitus with other specified complication: Secondary | ICD-10-CM | POA: Diagnosis present

## 2023-08-01 DIAGNOSIS — Z79890 Hormone replacement therapy (postmenopausal): Secondary | ICD-10-CM

## 2023-08-01 DIAGNOSIS — Z86718 Personal history of other venous thrombosis and embolism: Secondary | ICD-10-CM

## 2023-08-01 DIAGNOSIS — Z885 Allergy status to narcotic agent status: Secondary | ICD-10-CM

## 2023-08-01 DIAGNOSIS — R109 Unspecified abdominal pain: Secondary | ICD-10-CM | POA: Diagnosis present

## 2023-08-01 DIAGNOSIS — I5082 Biventricular heart failure: Secondary | ICD-10-CM | POA: Diagnosis present

## 2023-08-01 DIAGNOSIS — Z89511 Acquired absence of right leg below knee: Secondary | ICD-10-CM | POA: Diagnosis not present

## 2023-08-01 DIAGNOSIS — I5023 Acute on chronic systolic (congestive) heart failure: Secondary | ICD-10-CM | POA: Diagnosis not present

## 2023-08-01 DIAGNOSIS — I2781 Cor pulmonale (chronic): Secondary | ICD-10-CM | POA: Diagnosis present

## 2023-08-01 DIAGNOSIS — I272 Pulmonary hypertension, unspecified: Secondary | ICD-10-CM | POA: Diagnosis present

## 2023-08-01 DIAGNOSIS — Z8673 Personal history of transient ischemic attack (TIA), and cerebral infarction without residual deficits: Secondary | ICD-10-CM | POA: Diagnosis not present

## 2023-08-01 DIAGNOSIS — Z7401 Bed confinement status: Secondary | ICD-10-CM

## 2023-08-01 DIAGNOSIS — E119 Type 2 diabetes mellitus without complications: Secondary | ICD-10-CM

## 2023-08-01 DIAGNOSIS — Z794 Long term (current) use of insulin: Secondary | ICD-10-CM | POA: Diagnosis not present

## 2023-08-01 DIAGNOSIS — Z8249 Family history of ischemic heart disease and other diseases of the circulatory system: Secondary | ICD-10-CM

## 2023-08-01 DIAGNOSIS — R570 Cardiogenic shock: Secondary | ICD-10-CM | POA: Diagnosis present

## 2023-08-01 DIAGNOSIS — Z6841 Body Mass Index (BMI) 40.0 and over, adult: Secondary | ICD-10-CM | POA: Diagnosis not present

## 2023-08-01 DIAGNOSIS — I472 Ventricular tachycardia, unspecified: Secondary | ICD-10-CM | POA: Diagnosis present

## 2023-08-01 DIAGNOSIS — I5031 Acute diastolic (congestive) heart failure: Secondary | ICD-10-CM | POA: Insufficient documentation

## 2023-08-01 DIAGNOSIS — N1832 Chronic kidney disease, stage 3b: Secondary | ICD-10-CM | POA: Diagnosis present

## 2023-08-01 DIAGNOSIS — Z7984 Long term (current) use of oral hypoglycemic drugs: Secondary | ICD-10-CM

## 2023-08-01 DIAGNOSIS — D631 Anemia in chronic kidney disease: Secondary | ICD-10-CM | POA: Diagnosis present

## 2023-08-01 DIAGNOSIS — L89312 Pressure ulcer of right buttock, stage 2: Secondary | ICD-10-CM | POA: Diagnosis present

## 2023-08-01 DIAGNOSIS — R609 Edema, unspecified: Secondary | ICD-10-CM | POA: Diagnosis present

## 2023-08-01 DIAGNOSIS — I493 Ventricular premature depolarization: Secondary | ICD-10-CM | POA: Diagnosis present

## 2023-08-01 DIAGNOSIS — I5033 Acute on chronic diastolic (congestive) heart failure: Secondary | ICD-10-CM | POA: Diagnosis present

## 2023-08-01 DIAGNOSIS — E876 Hypokalemia: Secondary | ICD-10-CM | POA: Diagnosis present

## 2023-08-01 DIAGNOSIS — D649 Anemia, unspecified: Secondary | ICD-10-CM | POA: Diagnosis present

## 2023-08-01 DIAGNOSIS — E785 Hyperlipidemia, unspecified: Secondary | ICD-10-CM | POA: Diagnosis not present

## 2023-08-01 DIAGNOSIS — G8929 Other chronic pain: Secondary | ICD-10-CM | POA: Diagnosis present

## 2023-08-01 DIAGNOSIS — N179 Acute kidney failure, unspecified: Secondary | ICD-10-CM | POA: Diagnosis present

## 2023-08-01 DIAGNOSIS — I429 Cardiomyopathy, unspecified: Secondary | ICD-10-CM | POA: Diagnosis present

## 2023-08-01 DIAGNOSIS — E66813 Obesity, class 3: Secondary | ICD-10-CM | POA: Diagnosis present

## 2023-08-01 DIAGNOSIS — Z89611 Acquired absence of right leg above knee: Secondary | ICD-10-CM

## 2023-08-01 DIAGNOSIS — Z7901 Long term (current) use of anticoagulants: Secondary | ICD-10-CM

## 2023-08-01 DIAGNOSIS — I152 Hypertension secondary to endocrine disorders: Secondary | ICD-10-CM | POA: Diagnosis present

## 2023-08-01 DIAGNOSIS — Z993 Dependence on wheelchair: Secondary | ICD-10-CM

## 2023-08-01 DIAGNOSIS — E039 Hypothyroidism, unspecified: Secondary | ICD-10-CM | POA: Diagnosis present

## 2023-08-01 DIAGNOSIS — M25551 Pain in right hip: Secondary | ICD-10-CM | POA: Diagnosis present

## 2023-08-01 DIAGNOSIS — E662 Morbid (severe) obesity with alveolar hypoventilation: Secondary | ICD-10-CM | POA: Diagnosis present

## 2023-08-01 DIAGNOSIS — Z79899 Other long term (current) drug therapy: Secondary | ICD-10-CM

## 2023-08-01 DIAGNOSIS — I130 Hypertensive heart and chronic kidney disease with heart failure and stage 1 through stage 4 chronic kidney disease, or unspecified chronic kidney disease: Principal | ICD-10-CM | POA: Diagnosis present

## 2023-08-01 DIAGNOSIS — I440 Atrioventricular block, first degree: Secondary | ICD-10-CM | POA: Diagnosis present

## 2023-08-01 DIAGNOSIS — I2489 Other forms of acute ischemic heart disease: Secondary | ICD-10-CM | POA: Diagnosis present

## 2023-08-01 DIAGNOSIS — Z833 Family history of diabetes mellitus: Secondary | ICD-10-CM

## 2023-08-01 DIAGNOSIS — Z823 Family history of stroke: Secondary | ICD-10-CM

## 2023-08-01 DIAGNOSIS — I2510 Atherosclerotic heart disease of native coronary artery without angina pectoris: Secondary | ICD-10-CM | POA: Diagnosis present

## 2023-08-01 DIAGNOSIS — F17210 Nicotine dependence, cigarettes, uncomplicated: Secondary | ICD-10-CM | POA: Diagnosis present

## 2023-08-01 DIAGNOSIS — Z881 Allergy status to other antibiotic agents status: Secondary | ICD-10-CM

## 2023-08-01 DIAGNOSIS — N184 Chronic kidney disease, stage 4 (severe): Secondary | ICD-10-CM | POA: Diagnosis present

## 2023-08-01 LAB — CBC WITH DIFFERENTIAL/PLATELET
Abs Immature Granulocytes: 0.02 10*3/uL (ref 0.00–0.07)
Basophils Absolute: 0 10*3/uL (ref 0.0–0.1)
Basophils Relative: 1 %
Eosinophils Absolute: 0.1 10*3/uL (ref 0.0–0.5)
Eosinophils Relative: 1 %
HCT: 39.1 % (ref 36.0–46.0)
Hemoglobin: 12.3 g/dL (ref 12.0–15.0)
Immature Granulocytes: 0 %
Lymphocytes Relative: 15 %
Lymphs Abs: 1 10*3/uL (ref 0.7–4.0)
MCH: 28.5 pg (ref 26.0–34.0)
MCHC: 31.5 g/dL (ref 30.0–36.0)
MCV: 90.5 fL (ref 80.0–100.0)
Monocytes Absolute: 0.4 10*3/uL (ref 0.1–1.0)
Monocytes Relative: 6 %
Neutro Abs: 4.9 10*3/uL (ref 1.7–7.7)
Neutrophils Relative %: 77 %
Platelets: 227 10*3/uL (ref 150–400)
RBC: 4.32 MIL/uL (ref 3.87–5.11)
RDW: 13.5 % (ref 11.5–15.5)
WBC: 6.3 10*3/uL (ref 4.0–10.5)
nRBC: 0 % (ref 0.0–0.2)

## 2023-08-01 LAB — HEMOGLOBIN A1C
Hgb A1c MFr Bld: 7.2 % — ABNORMAL HIGH (ref 4.8–5.6)
Mean Plasma Glucose: 159.94 mg/dL

## 2023-08-01 LAB — COMPREHENSIVE METABOLIC PANEL
ALT: 11 U/L (ref 0–44)
AST: 18 U/L (ref 15–41)
Albumin: 2.8 g/dL — ABNORMAL LOW (ref 3.5–5.0)
Alkaline Phosphatase: 63 U/L (ref 38–126)
Anion gap: 7 (ref 5–15)
BUN: 67 mg/dL — ABNORMAL HIGH (ref 6–20)
CO2: 21 mmol/L — ABNORMAL LOW (ref 22–32)
Calcium: 8.5 mg/dL — ABNORMAL LOW (ref 8.9–10.3)
Chloride: 104 mmol/L (ref 98–111)
Creatinine, Ser: 2.06 mg/dL — ABNORMAL HIGH (ref 0.44–1.00)
GFR, Estimated: 30 mL/min — ABNORMAL LOW (ref >60)
Glucose, Bld: 151 mg/dL — ABNORMAL HIGH (ref 70–99)
Potassium: 5.1 mmol/L (ref 3.5–5.1)
Sodium: 132 mmol/L — ABNORMAL LOW (ref 135–145)
Total Bilirubin: 1.2 mg/dL (ref 0.0–1.2)
Total Protein: 7.3 g/dL (ref 6.5–8.1)

## 2023-08-01 LAB — I-STAT CHEM 8, ED
BUN: 56 mg/dL — ABNORMAL HIGH (ref 6–20)
Calcium, Ion: 1.18 mmol/L (ref 1.15–1.40)
Chloride: 102 mmol/L (ref 98–111)
Creatinine, Ser: 2.5 mg/dL — ABNORMAL HIGH (ref 0.44–1.00)
Glucose, Bld: 132 mg/dL — ABNORMAL HIGH (ref 70–99)
HCT: 38 % (ref 36.0–46.0)
Hemoglobin: 12.9 g/dL (ref 12.0–15.0)
Potassium: 4.3 mmol/L (ref 3.5–5.1)
Sodium: 137 mmol/L (ref 135–145)
TCO2: 22 mmol/L (ref 22–32)

## 2023-08-01 LAB — BRAIN NATRIURETIC PEPTIDE: B Natriuretic Peptide: 523.3 pg/mL — ABNORMAL HIGH (ref 0.0–100.0)

## 2023-08-01 LAB — HCG, SERUM, QUALITATIVE: Preg, Serum: NEGATIVE

## 2023-08-01 LAB — TROPONIN I (HIGH SENSITIVITY)
Troponin I (High Sensitivity): 33 ng/L — ABNORMAL HIGH (ref <18)
Troponin I (High Sensitivity): 36 ng/L — ABNORMAL HIGH (ref <18)

## 2023-08-01 LAB — CBG MONITORING, ED: Glucose-Capillary: 132 mg/dL — ABNORMAL HIGH (ref 70–99)

## 2023-08-01 LAB — GLUCOSE, CAPILLARY: Glucose-Capillary: 140 mg/dL — ABNORMAL HIGH (ref 70–99)

## 2023-08-01 MED ORDER — INSULIN ASPART 100 UNIT/ML IJ SOLN
0.0000 [IU] | Freq: Three times a day (TID) | INTRAMUSCULAR | Status: DC
  Administered 2023-08-01 – 2023-08-03 (×5): 2 [IU] via SUBCUTANEOUS
  Administered 2023-08-03 (×2): 3 [IU] via SUBCUTANEOUS
  Administered 2023-08-04 (×3): 2 [IU] via SUBCUTANEOUS
  Administered 2023-08-05 (×2): 3 [IU] via SUBCUTANEOUS
  Administered 2023-08-05 – 2023-08-06 (×3): 2 [IU] via SUBCUTANEOUS
  Administered 2023-08-07: 3 [IU] via SUBCUTANEOUS
  Administered 2023-08-07: 5 [IU] via SUBCUTANEOUS
  Administered 2023-08-07 – 2023-08-08 (×2): 3 [IU] via SUBCUTANEOUS
  Administered 2023-08-08 (×2): 5 [IU] via SUBCUTANEOUS
  Administered 2023-08-09: 2 [IU] via SUBCUTANEOUS
  Administered 2023-08-09 (×2): 3 [IU] via SUBCUTANEOUS
  Administered 2023-08-10 – 2023-08-15 (×11): 2 [IU] via SUBCUTANEOUS
  Administered 2023-08-16: 3 [IU] via SUBCUTANEOUS
  Administered 2023-08-16: 2 [IU] via SUBCUTANEOUS
  Administered 2023-08-16: 3 [IU] via SUBCUTANEOUS
  Administered 2023-08-17 – 2023-08-20 (×8): 2 [IU] via SUBCUTANEOUS
  Administered 2023-08-20: 3 [IU] via SUBCUTANEOUS
  Administered 2023-08-20: 2 [IU] via SUBCUTANEOUS
  Filled 2023-08-01: qty 0.15

## 2023-08-01 MED ORDER — OXYCODONE HCL 5 MG PO TABS
10.0000 mg | ORAL_TABLET | Freq: Four times a day (QID) | ORAL | Status: DC | PRN
  Administered 2023-08-01 – 2023-08-02 (×3): 10 mg via ORAL
  Filled 2023-08-01 (×3): qty 2

## 2023-08-01 MED ORDER — ACETAMINOPHEN 325 MG PO TABS
650.0000 mg | ORAL_TABLET | Freq: Once | ORAL | Status: AC
  Administered 2023-08-01: 650 mg via ORAL
  Filled 2023-08-01: qty 2

## 2023-08-01 MED ORDER — FUROSEMIDE 10 MG/ML IJ SOLN
60.0000 mg | Freq: Once | INTRAMUSCULAR | Status: AC
  Administered 2023-08-01: 60 mg via INTRAVENOUS
  Filled 2023-08-01: qty 8

## 2023-08-01 MED ORDER — FUROSEMIDE 10 MG/ML IJ SOLN
60.0000 mg | Freq: Two times a day (BID) | INTRAMUSCULAR | Status: DC
Start: 2023-08-01 — End: 2023-08-02
  Administered 2023-08-01 – 2023-08-02 (×2): 60 mg via INTRAVENOUS
  Filled 2023-08-01: qty 8
  Filled 2023-08-01 (×2): qty 6

## 2023-08-01 MED ORDER — TRAZODONE HCL 50 MG PO TABS
25.0000 mg | ORAL_TABLET | Freq: Every evening | ORAL | Status: DC | PRN
  Administered 2023-08-05 – 2023-08-20 (×11): 25 mg via ORAL
  Filled 2023-08-01 (×12): qty 1

## 2023-08-01 MED ORDER — ALBUTEROL SULFATE (2.5 MG/3ML) 0.083% IN NEBU: 2.5000 mg | INHALATION_SOLUTION | RESPIRATORY_TRACT | Status: DC | PRN

## 2023-08-01 MED ORDER — APIXABAN 5 MG PO TABS
5.0000 mg | ORAL_TABLET | Freq: Two times a day (BID) | ORAL | Status: DC
  Administered 2023-08-01 – 2023-08-21 (×40): 5 mg via ORAL
  Filled 2023-08-01 (×40): qty 1

## 2023-08-01 MED ORDER — ACETAMINOPHEN 325 MG PO TABS
650.0000 mg | ORAL_TABLET | Freq: Four times a day (QID) | ORAL | Status: DC | PRN
  Administered 2023-08-01 – 2023-08-15 (×11): 650 mg via ORAL
  Filled 2023-08-01 (×11): qty 2

## 2023-08-01 MED ORDER — ACETAMINOPHEN 650 MG RE SUPP: 650.0000 mg | Freq: Four times a day (QID) | RECTAL | Status: DC | PRN

## 2023-08-01 MED ORDER — INSULIN ASPART 100 UNIT/ML IJ SOLN
0.0000 [IU] | Freq: Every day | INTRAMUSCULAR | Status: DC
  Administered 2023-08-11: 2 [IU] via SUBCUTANEOUS
  Filled 2023-08-01: qty 0.05

## 2023-08-01 MED ORDER — LEVOTHYROXINE SODIUM 75 MCG PO TABS
75.0000 ug | ORAL_TABLET | Freq: Every day | ORAL | Status: DC
  Administered 2023-08-02 – 2023-08-21 (×20): 75 ug via ORAL
  Filled 2023-08-01 (×20): qty 1

## 2023-08-01 MED ORDER — METOCLOPRAMIDE HCL 10 MG PO TABS
10.0000 mg | ORAL_TABLET | Freq: Once | ORAL | Status: AC
  Administered 2023-08-01: 10 mg via ORAL
  Filled 2023-08-01: qty 1

## 2023-08-01 MED ORDER — INSULIN GLARGINE-YFGN 100 UNIT/ML ~~LOC~~ SOLN
10.0000 [IU] | Freq: Every day | SUBCUTANEOUS | Status: DC
Start: 2023-08-01 — End: 2023-08-01

## 2023-08-01 MED ORDER — ATORVASTATIN CALCIUM 40 MG PO TABS
40.0000 mg | ORAL_TABLET | Freq: Every day | ORAL | Status: DC
  Administered 2023-08-01 – 2023-08-19 (×19): 40 mg via ORAL
  Filled 2023-08-01 (×19): qty 1

## 2023-08-01 NOTE — H&P (Signed)
 History and Physical  Karen Bennett FMW:[id number] DOB: 1976/01/01 DOA: 08/01/2023  PCP: Suanne Pfeiffer, NP   Chief Complaint: Leg swelling, dyspnea  HPI: Karen Bennett is a 48 y.o. female with medical history significant for anxiety, hypertension, hypothyroidism, type 2 diabetes, CKD stage III, heart failure with preserved EF, PE on Eliquis  being admitted to the hospital with lower extremity edema suspicious for acute on chronic heart failure with preserved EF.  She was admitted to the hospital in August 2024 at the time was noncompliant with medication, but now tells me that she has been compliant with her insulin , diuretics, and most importantly her Eliquis  which she takes for an acute PE diagnosed in July 2024.  Overall has been doing well, but a couple weeks ago started to develop worsening lower extremity edema.  Denies any chest pain, cough, significant shortness of breath, though she does get dyspneic with any exertion, such as moving around in the bed.  States that for the last several months she has been good about following up with her PCP, typically is not on any diuretic at baseline.  However couple weeks ago when she started developing lower extremity edema, her PCP placed her on oral Lasix .  This did not have much of an effect, so starting about a week ago she was placed on torsemide  40 mg 3 times daily.  Patient feels like since starting this medication, she has been urinating a little bit more, but her edema has been stable and not improving.  Review of Systems: Please see HPI for pertinent positives and negatives. A complete 10 system review of systems are otherwise negative.  Past Medical History:  Diagnosis Date   Anxiety and depression 06/26/2013   Cellulitis    Chronic kidney disease, stage 3a (HCC) 01/11/2023   Diabetes mellitus    Hyperlipidemia associated with type 2 diabetes mellitus (HCC) 05/02/2013   Hypertension associated with diabetes (HCC) 12/27/2020    Hypothyroidism    Infective endocarditis    PE (pulmonary embolism) ~2007-2008      Past Surgical History:  Procedure Laterality Date   Abscess removal from L groin     APPLICATION OF WOUND VAC Left 05/01/2013   Procedure: APPLICATION OF WOUND VAC;  Surgeon: Lynwood FORBES Better, MD;  Location: [facility];  Service: Orthopedics;  Laterality: Left;   BELOW KNEE LEG AMPUTATION Right    CYSTOSCOPY W/ URETERAL STENT PLACEMENT Right 09/03/2021   Procedure: CYSTOSCOPY WITH RETROGRADE PYELOGRAM/URETERAL STENT PLACEMENT;  Surgeon: Matilda Senior, MD;  Location: [facility];  Service: Urology;  Laterality: Right;   I & D EXTREMITY  11/12/2011   Procedure: IRRIGATION AND DEBRIDEMENT EXTREMITY;  Surgeon: Lonni CINDERELLA Poli, MD;  Location: [facility];  Service: Orthopedics;  Laterality: Left;  foot left   I & D EXTREMITY Left 09/06/2012   Procedure: IRRIGATION AND DEBRIDEMENT EXTREMITY;  Surgeon: Oneil JAYSON Herald, MD;  Location: [facility];  Service: Orthopedics;  Laterality: Left;   I & D EXTREMITY Left 05/01/2013   Procedure: INCISION AND DRAINAGE LEFT FOREFOOT ABCESS ;  Surgeon: Lynwood FORBES Better, MD;  Location: [facility];  Service: Orthopedics;  Laterality: Left;   INCISION AND DRAINAGE ABSCESS Left 12/27/2020   Procedure: INCISION AND DRAINAGE LEFT THIGH ABSCESS;  Surgeon: Signe Mitzie LABOR, MD;  Location: [facility];  Service: General;  Laterality: Left;   IR FLUORO GUIDE CV LINE RIGHT  09/12/2021   IR PTA VENOUS EXCEPT DIALYSIS CIRCUIT  01/04/2021   IR RADIOLOGIST EVAL & MGMT  10/25/2021   IR US  GUIDE VASC ACCESS RIGHT  09/12/2021   IR VENO/EXT/UNI RIGHT  01/04/2021   Surgery to remove hematoma in L leg     TEE WITHOUT CARDIOVERSION N/A 01/03/2021   Procedure: TRANSESOPHAGEAL ECHOCARDIOGRAM (TEE);  Surgeon: Shlomo Wilbert SAUNDERS, MD;  Location: [facility] ENDOSCOPY;  Service: Cardiovascular;  Laterality: N/A;   TEE WITHOUT CARDIOVERSION N/A 03/23/2023   Procedure: TRANSESOPHAGEAL ECHOCARDIOGRAM;  Surgeon: Kate Lonni CROME, MD;   Location: [facility] INVASIVE CV LAB;  Service: Cardiovascular;  Laterality: N/A;   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   Social History:  reports that she has never smoked. She has never used smokeless tobacco. She reports that she does not drink alcohol and does not use drugs.  Allergies  Allergen Reactions   Clindamycin  Diarrhea and Nausea And Vomiting   Zofran  Itching and Nausea And Vomiting   Doxycycline Diarrhea and Nausea And Vomiting   Morphine  And Codeine Hives, Itching and Rash    Family History  Problem Relation Age of Onset   Diabetes Mother    Coronary artery disease Mother    Stroke Mother      Prior to Admission medications   Medication Sig Start Date End Date Taking? Authorizing Provider  acetaminophen  (TYLENOL ) 500 MG tablet Take 500 mg by mouth every 6 (six) hours as needed for moderate pain.    [provider]  apixaban  (ELIQUIS ) 5 MG TABS tablet Take 1 tablet (5 mg total) by mouth 2 (two) times daily. 03/23/23   Samtani, Jai-Gurmukh, MD  atorvastatin  (LIPITOR) 40 MG tablet Take 1 tablet (40 mg total) by mouth daily at 6 PM. 03/23/23 06/21/23  Samtani, Jai-Gurmukh, MD  Continuous Glucose Sensor (FREESTYLE LIBRE 3 SENSOR) MISC Place 1 sensor on the skin every 14 days. Use to check glucose continuously 03/23/23   Samtani, Jai-Gurmukh, MD  furosemide  (LASIX ) 20 MG tablet Take 1 tablet (20 mg total) by mouth every other day. 02/23/23 03/25/23  Briana Elgin LABOR, MD  glucose blood test strip Use as instructed 06/26/13   Colton Drape, MD  glucose monitoring kit (FREESTYLE) monitoring kit 1 each by Does not apply route as needed for other. 06/26/13   Advani, Deepak, MD  insulin  glargine (SEMGLEE , YFGN,) 100 UNIT/ML Solostar Pen Inject 10 Units into the skin at bedtime. 03/23/23   Samtani, Jai-Gurmukh, MD  levothyroxine  (SYNTHROID ) 50 MCG tablet Take 1 tablet (50 mcg total) by mouth daily at 6 (six) AM. Patient not taking: Reported on 03/22/2023 02/23/23   Briana Elgin LABOR, MD   Oxycodone  HCl 10 MG TABS Take 1 tablet (10 mg total) by mouth every 12 (twelve) hours as needed. 03/13/23   Garrick Charleston, MD    Physical Exam: BP 114/69   Pulse 79   Temp 97.6 F (36.4 C) (Oral)   Resp 14   LMP 04/13/2013   SpO2 95%  General:  Alert, oriented, calm, in no acute distress, resting comfortably on room air, pleasant and cooperative, a good historian.  She is morbidly obese. Eyes: EOMI, clear conjuctivae, white sclerea Neck: supple, no masses, trachea mildline  Cardiovascular: RRR, no murmurs or rubs, difficult exam due to body habitus, though she does have 1-2+ pitting edema in the bilateral lower extremities up to the hips Respiratory: Clear to auscultation bilaterally on anterior exam, though breath sounds are very distant due to her body habitus.  No obvious wheezing, rhonchi, tachypnea or respiratory distress. Abdomen: soft, nontender, morbidly obese Skin: dry, no rashes  Musculoskeletal: no joint effusions, normal range of motion  Psychiatric: appropriate affect, normal speech  Neurologic: extraocular muscles intact, clear speech, moving all extremities with intact sensorium         Labs on Admission:  Basic Metabolic Panel: Recent Labs  Lab 08/01/23 1140  NA 132*  K 5.1  CL 104  CO2 21*  GLUCOSE 151*  BUN 67*  CREATININE 2.06*  CALCIUM  8.5*   Liver Function Tests: Recent Labs  Lab 08/01/23 1140  AST 18  ALT 11  ALKPHOS 63  BILITOT 1.2  PROT 7.3  ALBUMIN  2.8*   No results for input(s): LIPASE, AMYLASE in the last 168 hours. No results for input(s): AMMONIA in the last 168 hours. CBC: Recent Labs  Lab 08/01/23 1140  WBC 6.3  NEUTROABS 4.9  HGB 12.3  HCT 39.1  MCV 90.5  PLT 227   Cardiac Enzymes: No results for input(s): CKTOTAL, CKMB, CKMBINDEX, TROPONINI in the last 168 hours. BNP (last 3 results) Recent Labs    01/24/23 2225 02/09/23 1736 08/01/23 1140  BNP 321.0* 345.1* 523.3*    ProBNP (last 3 results) No  results for input(s): PROBNP in the last 8760 hours.  CBG: No results for input(s): GLUCAP in the last 168 hours.  Radiological Exams on Admission: DG Chest Portable 1 View Result Date: 08/01/2023 CLINICAL DATA:  Shortness of breath and leg swelling. EXAM: PORTABLE CHEST 1 VIEW COMPARISON:  Chest x-ray dated February 09, 2023. CT chest dated January 25, 2023. FINDINGS: Unchanged cardiomegaly. Pulmonary vascular congestion. Similar hazy density at both lung bases likely reflecting layering small pleural effusions with associated bibasilar atelectasis. No pneumothorax. No acute osseous abnormality. IMPRESSION: 1. Mild congestive heart failure. Electronically Signed   By: Elsie ONEIDA Shoulder M.D.   On: 08/01/2023 12:27   Assessment/Plan Karen Bennett is a 48 y.o. female with medical history significant for anxiety, hypertension, hypothyroidism, type 2 diabetes, CKD stage III, heart failure with preserved EF, PE on Eliquis  being admitted to the hospital with lower extremity edema suspicious for acute on chronic heart failure with preserved EF.  Acute on chronic heart failure with preserved EF-in the setting of severe pulmonary hypertension.  Patient looks volume overloaded on exam, hypertensive, with elevated BNP. -Inpatient admission -Telemetry monitoring -Heart healthy diet -Continue aggressive diuresis with IV Lasix  -Nonurgent inpatient cardiology consultation requested  CKD stage III-with baseline creatinine 1.6-1.9, renal function not significantly worse from baseline -Monitor closely in the setting of aggressive diuresis  Insulin -dependent type 2 diabetes-previously very poorly controlled though patient states she has been more compliant of late -Carb controlled diet -Semglee  10 units nightly, with moderate dose sliding scale (may need to be intensified) -Update hemoglobin A1c  Hypothyroidism-continue Synthroid   Hyperlipidemia-continue Lipitor  Super morbid obesity-BMI greater than 55, this  increases her mortality and complicates all aspects of care  History of acute PE July 2024-continue Eliquis  5 mg p.o. twice daily  DVT prophylaxis: Eliquis      Code Status: Full Code  Consults called: Cardiology  Admission status: The appropriate patient status for this patient is INPATIENT. Inpatient status is judged to be reasonable and necessary in order to provide the required intensity of service to ensure the patient's safety. The patient's presenting symptoms, physical exam findings, and initial radiographic and laboratory data in the context of their chronic comorbidities is felt to place them at high risk for further clinical deterioration. Furthermore, it is not anticipated that the patient will be medically stable for discharge from the hospital within  2 midnights of admission.    I certify that at the point of admission it is my clinical judgment that the patient will require inpatient hospital care spanning beyond 2 midnights from the point of admission due to high intensity of service, high risk for further deterioration and high frequency of surveillance required  Time spent: 59 minutes  Adithi Gammon CHRISTELLA Gail MD [facility] Pager [phone]  If 7PM-7AM, please contact night-coverage [email or web address] Password [email or web address]  08/01/2023, 2:04 PM

## 2023-08-01 NOTE — ED Provider Notes (Signed)
 [place] EMERGENCY DEPARTMENT AT [facility] Provider Note   CSN: [id number] Arrival date & time: 08/01/23  9046     History  Chief Complaint  Patient presents with   Leg Swelling    Karen Bennett is a 48 y.o. female.  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    Severe swelling legs, lower abdomen. Feels like coult take a pin and pop it like a balloon, legs bilaterally Abd within the last day, legs started swelling about 2 weeks ago. Started on furosemide  for 1 week, still not getting better. Messaged her again and started on torsemide  then woke up today and is worse. Can't move, can't take care of self. Live on own, used to taking care of self but can't with swelling.  No dyspnea or chest pain.  No abdominal. No vomiting or diarrhea. Does have some nausea thinks from pain or nerves. No fevers or cough.    Has been taking other meds icluding blood thinners 40mg  TID torsemide  for the last week   Past Medical History:  Diagnosis Date   Anxiety and depression 06/26/2013   Cellulitis    Chronic kidney disease, stage 3a (HCC) 01/11/2023   Diabetes mellitus    Hyperlipidemia associated with type 2 diabetes mellitus (HCC) 05/02/2013   Hypertension associated with diabetes (HCC) 12/27/2020   Hypothyroidism    Infective endocarditis    PE (pulmonary embolism) ~2007-2008   Not on anticoagulation     Home Medications Prior to  Admission medications   Medication Sig Start Date End Date Taking? Authorizing Provider  apixaban  (ELIQUIS ) 5 MG TABS tablet Take 1 tablet (5 mg total) by mouth 2 (two) times daily. 03/23/23  Yes Samtani, Jai-Gurmukh, MD  atorvastatin  (LIPITOR) 40 MG tablet Take 1 tablet (40 mg total) by mouth daily at 6 PM. 03/23/23 08/01/23 Yes Samtani, Jai-Gurmukh, MD  Continuous Glucose Sensor (FREESTYLE LIBRE 3 SENSOR) MISC Place 1 sensor on the skin every 14 days. Use to check glucose continuously 03/23/23  Yes Samtani, Jai-Gurmukh, MD  hydrOXYzine  (VISTARIL ) 50 MG capsule Take 50 mg by mouth 2 (two) times daily as needed for itching or anxiety.   Yes [provider]  insulin  lispro (HUMALOG ) 100 UNIT/ML injection Inject 1-6 Units into the skin See admin instructions. Inject 1-6 units into the skin three times a day with meals, PER SLIDING SCALE   Yes [provider]  JARDIANCE 25 MG TABS tablet Take 25 mg by mouth daily.   Yes [provider]  LANTUS  SOLOSTAR 100 UNIT/ML Solostar Pen Inject 10 Units into the skin at bedtime.   Yes [provider]  levothyroxine  (SYNTHROID ) 75 MCG tablet Take 75 mcg by mouth daily before breakfast.   Yes [provider]  methocarbamol  (ROBAXIN ) 750 MG tablet Take 750 mg by mouth every 8 (eight) hours as needed for muscle spasms. 07/09/23  Yes [provider]  minoxidil  (LONITEN ) 10 MG tablet Take 5 mg by mouth daily.   Yes [provider]  MOUNJARO 2.5 MG/0.5ML Pen Inject  2.5 mg into the skin every Wednesday.   Yes [provider]  Oxycodone  HCl 10 MG TABS Take 1 tablet (10 mg total) by mouth every 12 (twelve) hours as needed. Patient taking differently: Take 10 mg by mouth in the morning, at noon, and at bedtime. 03/13/23  Yes Garrick Charleston, MD  potassium chloride  (KLOR-CON  M) 10 MEQ tablet Take 10 mEq by mouth in the morning and at bedtime. 07/16/23  Yes [provider]  promethazine  (PHENERGAN ) 25  MG tablet Take 25 mg by mouth every 6 (six) hours as needed for nausea or vomiting.   Yes [provider]  torsemide  (DEMADEX ) 20 MG tablet Take 20 mg by mouth in the morning, at noon, and at bedtime. 07/26/23  Yes [provider]  traZODone  (DESYREL ) 50 MG tablet Take 50 mg by mouth at bedtime.   Yes [provider]  triamcinolone cream (KENALOG) 0.5 % Apply 1 Application topically 3 (three) times daily as needed (for itching or irritation- affected areas).   Yes [provider]  TYLENOL  500 MG tablet Take 500-1,000 mg by mouth every 6 (six) hours as needed for mild pain (pain score 1-3) or headache.   Yes [provider]  venlafaxine  XR (EFFEXOR -XR) 37.5 MG 24 hr capsule Take 37.5 mg by mouth daily. 06/27/23  Yes [provider]  furosemide  (LASIX ) 20 MG tablet Take 1 tablet (20 mg total) by mouth every other day. Patient not taking: Reported on 08/01/2023 02/23/23 08/01/23  Briana Elgin LABOR, MD  glucose blood test strip Use as instructed 06/26/13   Colton Drape, MD  glucose monitoring kit (FREESTYLE) monitoring kit 1 each by Does not apply route as needed for other. 06/26/13   Advani, Deepak, MD  insulin  glargine (SEMGLEE , YFGN,) 100 UNIT/ML Solostar Pen Inject 10 Units into the skin at bedtime. Patient not taking: Reported on 08/01/2023 03/23/23   Samtani, Jai-Gurmukh, MD  levothyroxine  (SYNTHROID ) 50 MCG tablet Take 1 tablet (50 mcg total) by mouth daily at 6 (six) AM. Patient not taking: Reported on 03/22/2023 02/23/23   Briana Elgin LABOR, MD      Allergies    Clindamycin , Zofran , Doxycycline, and Morphine  and codeine    Review of Systems   Review of Systems  Physical Exam Updated Vital Signs BP 116/68 (BP Location: Right Arm)   Pulse 84   Temp 98 F (36.7 C) (Oral)   Resp 20   LMP 04/13/2013   SpO2 93%  Physical Exam Vitals and nursing note reviewed.  Constitutional:      General: She is not in acute distress.    Appearance: She is  well-developed. She is obese. She is not diaphoretic.  HENT:     Head: Normocephalic and atraumatic.  Eyes:     Conjunctiva/sclera: Conjunctivae normal.  Cardiovascular:     Rate and Rhythm: Normal rate and regular rhythm.     Heart sounds: Normal heart sounds. No murmur heard.    No friction rub. No gallop.  Pulmonary:     Effort: Pulmonary effort is normal. No respiratory distress.     Breath sounds: Normal breath sounds. No wheezing or rales.  Abdominal:     General: There is no distension.     Palpations: Abdomen is soft.     Tenderness: There is no abdominal tenderness. There is no guarding.  Musculoskeletal:        General: Swelling present. No tenderness.     Cervical back: Normal range of motion.  Right lower leg: Edema present.     Left lower leg: Edema (pitting edema bilaterally through thigh, question mild edema to abdomen) present.  Skin:    General: Skin is warm and dry.     Findings: Rash (inguinal crease bilaterally) present. No erythema.  Neurological:     Mental Status: She is alert and oriented to person, place, and time.     ED Results / Procedures / Treatments   Labs (all labs ordered are listed, but only abnormal results are displayed) Labs Reviewed  COMPREHENSIVE METABOLIC PANEL - Abnormal; Notable for the following components:      Result Value   Sodium 132 (*)    CO2 21 (*)    Glucose, Bld 151 (*)    BUN 67 (*)    Creatinine, Ser 2.06 (*)    Calcium  8.5 (*)    Albumin  2.8 (*)    GFR, Estimated 30 (*)    All other components within normal limits  BRAIN NATRIURETIC PEPTIDE - Abnormal; Notable for the following components:   B Natriuretic Peptide 523.3 (*)    All other components within normal limits  GLUCOSE, CAPILLARY - Abnormal; Notable for the following components:   Glucose-Capillary 140 (*)    All other components within normal limits  I-STAT CHEM 8, ED - Abnormal; Notable for the following components:   BUN 56 (*)    Creatinine, Ser  2.50 (*)    Glucose, Bld 132 (*)    All other components within normal limits  CBG MONITORING, ED - Abnormal; Notable for the following components:   Glucose-Capillary 132 (*)    All other components within normal limits  TROPONIN I (HIGH SENSITIVITY) - Abnormal; Notable for the following components:   Troponin I (High Sensitivity) 33 (*)    All other components within normal limits  TROPONIN I (HIGH SENSITIVITY) - Abnormal; Notable for the following components:   Troponin I (High Sensitivity) 36 (*)    All other components within normal limits  CBC WITH DIFFERENTIAL/PLATELET  HCG, SERUM, QUALITATIVE  URINALYSIS, W/ REFLEX TO CULTURE (INFECTION SUSPECTED)  HEMOGLOBIN A1C  BASIC METABOLIC PANEL  CBC    EKG EKG Interpretation Date/Time:  Wednesday August 01 2023 10:41:04 EST Ventricular Rate:  83 PR Interval:  236 QRS Duration:  170 QT Interval:  434 QTC Calculation: 510 R Axis:   253  Text Interpretation: Sinus rhythm Prolonged PR interval Left atrial enlargement Right bundle branch block Anterolateral infarct, old No significant change since last tracing Confirmed by Dreama Longs ([id number]) on 08/01/2023 11:45:02 AM  Radiology DG Chest Portable 1 View Result Date: 08/01/2023 CLINICAL DATA:  Shortness of breath and leg swelling. EXAM: PORTABLE CHEST 1 VIEW COMPARISON:  Chest x-ray dated February 09, 2023. CT chest dated January 25, 2023. FINDINGS: Unchanged cardiomegaly. Pulmonary vascular congestion. Similar hazy density at both lung bases likely reflecting layering small pleural effusions with associated bibasilar atelectasis. No pneumothorax. No acute osseous abnormality. IMPRESSION: 1. Mild congestive heart failure. Electronically Signed   By: Elsie ONEIDA Shoulder M.D.   On: 08/01/2023 12:27    Procedures Procedures    Medications Ordered in ED Medications  atorvastatin  (LIPITOR) tablet 40 mg (40 mg Oral Given 08/01/23 1817)  levothyroxine  (SYNTHROID ) tablet 75 mcg (has no  administration in time range)  apixaban  (ELIQUIS ) tablet 5 mg (5 mg Oral Given 08/01/23 1817)  insulin  aspart (novoLOG ) injection 0-15 Units (2 Units Subcutaneous Given 08/01/23 1819)  insulin  aspart (novoLOG ) injection 0-5 Units (has no administration in  time range)  acetaminophen  (TYLENOL ) tablet 650 mg (650 mg Oral Given 08/01/23 1848)    Or  acetaminophen  (TYLENOL ) suppository 650 mg ( Rectal See Alternative 08/01/23 1848)  traZODone  (DESYREL ) tablet 25 mg (has no administration in time range)  albuterol  (PROVENTIL ) (2.5 MG/3ML) 0.083% nebulizer solution 2.5 mg (has no administration in time range)  oxyCODONE  (Oxy IR/ROXICODONE ) immediate release tablet 10 mg (10 mg Oral Given 08/01/23 1727)  furosemide  (LASIX ) injection 60 mg (60 mg Intravenous Given 08/01/23 1818)  acetaminophen  (TYLENOL ) tablet 650 mg (650 mg Oral Given 08/01/23 1139)  metoCLOPramide  (REGLAN ) tablet 10 mg (10 mg Oral Given 08/01/23 1139)  furosemide  (LASIX ) injection 60 mg (60 mg Intravenous Given 08/01/23 1424)    ED Course/ Medical Decision Making/ A&P                                  48 year old female with a history of hypothyroidism, anemia, hyperlipidemia, type 2 diabetes, CKD stage III, congestive heart failure, pulmonary hypertension, bradycardia with sinus pauses, morbid obesity, lower extremity cellulitis, history of osteomyelitis and 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   EKG completed personally about interpreted by me shows no acute change in comparison to prior.  Chest x-ray evaluated by me and is limited, overall similar to prior, although some concern for increased interstitial edema on my review.  Radiology reports mild congestive heart  failure  Labs completed and personally evaluated and interpreted by me show Cr 2.5, BUN 56, troponin 36 (prior normal), no anemia nor leukocytosis, BNP 523 from previous 345.   Given she is having increasing swelling despite increasing doses of diuretics as an outpatient and difficulty caring for herself at home in the setting of increasing edema, will admit for diuresis and inpatient evaluation.  Given 60mg  IV lasix .          Final Clinical Impression(s) / ED Diagnoses Final diagnoses:  Acute on chronic congestive heart failure, unspecified heart failure type [facility])    Rx / DC Orders ED Discharge Orders     None         Dreama Longs, MD 08/01/23 2150

## 2023-08-01 NOTE — ED Triage Notes (Signed)
 Per EMS from home. C/o increased edema in lower extremity unresolved by prescribed meds.  BP 126 HR 96 RR 18 O2 94 RA

## 2023-08-02 ENCOUNTER — Encounter (HOSPITAL_COMMUNITY): Payer: Self-pay | Admitting: Internal Medicine

## 2023-08-02 ENCOUNTER — Inpatient Hospital Stay (HOSPITAL_COMMUNITY): Payer: 59

## 2023-08-02 DIAGNOSIS — D649 Anemia, unspecified: Secondary | ICD-10-CM | POA: Diagnosis not present

## 2023-08-02 DIAGNOSIS — N1831 Chronic kidney disease, stage 3a: Secondary | ICD-10-CM | POA: Diagnosis not present

## 2023-08-02 DIAGNOSIS — Z794 Long term (current) use of insulin: Secondary | ICD-10-CM

## 2023-08-02 DIAGNOSIS — I5033 Acute on chronic diastolic (congestive) heart failure: Secondary | ICD-10-CM | POA: Diagnosis not present

## 2023-08-02 DIAGNOSIS — N179 Acute kidney failure, unspecified: Secondary | ICD-10-CM | POA: Diagnosis not present

## 2023-08-02 DIAGNOSIS — I5031 Acute diastolic (congestive) heart failure: Secondary | ICD-10-CM | POA: Diagnosis not present

## 2023-08-02 DIAGNOSIS — E119 Type 2 diabetes mellitus without complications: Secondary | ICD-10-CM

## 2023-08-02 DIAGNOSIS — I509 Heart failure, unspecified: Secondary | ICD-10-CM

## 2023-08-02 LAB — BASIC METABOLIC PANEL
Anion gap: 9 (ref 5–15)
BUN: 66 mg/dL — ABNORMAL HIGH (ref 6–20)
CO2: 22 mmol/L (ref 22–32)
Calcium: 8.3 mg/dL — ABNORMAL LOW (ref 8.9–10.3)
Chloride: 100 mmol/L (ref 98–111)
Creatinine, Ser: 2.43 mg/dL — ABNORMAL HIGH (ref 0.44–1.00)
GFR, Estimated: 24 mL/min — ABNORMAL LOW (ref >60)
Glucose, Bld: 129 mg/dL — ABNORMAL HIGH (ref 70–99)
Potassium: 4.2 mmol/L (ref 3.5–5.1)
Sodium: 131 mmol/L — ABNORMAL LOW (ref 135–145)

## 2023-08-02 LAB — ECHOCARDIOGRAM COMPLETE
AR max vel: 1.72 cm2
AV Area VTI: 1.7 cm2
AV Area mean vel: 1.68 cm2
AV Mean grad: 4 mm[Hg]
AV Peak grad: 7.4 mm[Hg]
Ao pk vel: 1.36 m/s
Area-P 1/2: 3.08 cm2
Calc EF: 40.8 %
Height: 66 in
MV M vel: 4.11 m/s
MV Peak grad: 67.6 mm[Hg]
MV VTI: 1.26 cm2
S' Lateral: 4.5 cm
Single Plane A2C EF: 40.3 %
Single Plane A4C EF: 45.7 %

## 2023-08-02 LAB — CBC
HCT: 37.4 % (ref 36.0–46.0)
Hemoglobin: 11.4 g/dL — ABNORMAL LOW (ref 12.0–15.0)
MCH: 27.9 pg (ref 26.0–34.0)
MCHC: 30.5 g/dL (ref 30.0–36.0)
MCV: 91.4 fL (ref 80.0–100.0)
Platelets: 221 10*3/uL (ref 150–400)
RBC: 4.09 MIL/uL (ref 3.87–5.11)
RDW: 13.4 % (ref 11.5–15.5)
WBC: 5 10*3/uL (ref 4.0–10.5)
nRBC: 0 % (ref 0.0–0.2)

## 2023-08-02 LAB — GLUCOSE, CAPILLARY
Glucose-Capillary: 137 mg/dL — ABNORMAL HIGH (ref 70–99)
Glucose-Capillary: 148 mg/dL — ABNORMAL HIGH (ref 70–99)
Glucose-Capillary: 121 mg/dL — ABNORMAL HIGH (ref 70–99)
Glucose-Capillary: 104 mg/dL — ABNORMAL HIGH (ref 70–99)

## 2023-08-02 MED ORDER — OXYCODONE HCL 5 MG PO TABS
10.0000 mg | ORAL_TABLET | ORAL | Status: DC | PRN
  Administered 2023-08-02 – 2023-08-10 (×30): 10 mg via ORAL
  Filled 2023-08-02 (×30): qty 2

## 2023-08-02 MED ORDER — FUROSEMIDE 10 MG/ML IJ SOLN
100.0000 mg | Freq: Two times a day (BID) | INTRAVENOUS | Status: DC
  Administered 2023-08-02 – 2023-08-03 (×2): 100 mg via INTRAVENOUS
  Filled 2023-08-02 (×3): qty 10

## 2023-08-02 MED ORDER — METOLAZONE 5 MG PO TABS
5.0000 mg | ORAL_TABLET | Freq: Once | ORAL | Status: AC
  Administered 2023-08-02: 5 mg via ORAL
  Filled 2023-08-02: qty 1

## 2023-08-02 MED ORDER — GERHARDT'S BUTT CREAM
TOPICAL_CREAM | Freq: Three times a day (TID) | CUTANEOUS | Status: DC
  Administered 2023-08-02 – 2023-08-12 (×3): 1 via TOPICAL
  Filled 2023-08-02 (×3): qty 60

## 2023-08-02 MED ORDER — PERFLUTREN LIPID MICROSPHERE
1.0000 mL | INTRAVENOUS | Status: AC | PRN
  Administered 2023-08-02: 3 mL via INTRAVENOUS

## 2023-08-02 MED ORDER — ORAL CARE MOUTH RINSE: 15.0000 mL | OROMUCOSAL | Status: DC | PRN

## 2023-08-02 NOTE — Progress Notes (Signed)
 [employer], is a 48 y.o. female, DOB - [birth year], FMW:[id number] Admit date - 08/01/2023    Outpatient Primary MD for the patient is Suanne Pfeiffer, NP  LOS - 1  days  Chief Complaint  Patient presents with   Leg Swelling       Brief summary   Patient is a 48 year old female with anxiety, HTN, hypothyroidism, diabetes mellitus type 2, CKD stage IIIb, morbid obesity, PE on Eliquis , history of Candida albicans fungemia, MV endocarditis in 12/2020 presented to ED with worsening lower extremity edema, abdominal bloating, orthopnea and PND.  Per patient, she had 2 weeks of abdominal bloating, bilateral lower extremity swelling.  She was placed on Lasix  initially however with no significant improvement, she was then transition to oral torsemide .  Reports that she has been compliant with torsemide , 3 times daily however symptoms have not been improving.  In the last 2 weeks, she has dyspnea with exertion, at baseline wheelchair-bound and gets short of breath with transfers.  Does not follow cardiologist regularly however states she has hole in her heart. Last hospitalized in 02/2023, found to have CVA, 2D echo bubble study had shown EF 45% with apical hypokinesis, mild MR and possible interatrial shunt.  Assessment & Plan    Principal Problem:   Acute on chronic heart failure with reduced ejection fraction (HFpEF) (HCC) -TEE 02/2023 had shown EF of 45 to 50%, decreased left ventricular function, RV overload, mildly reduced RV systolic function.  2D echo had shown EF of 45%, apical hypokinesis. -BNP 523, troponin 33-36, CXR with mild CHF -Started on IV Lasix , 60 mg every 12 hours, strict I's and O's and daily weights - however creatinine worsening to 2.43, obtain 2D echo -Cardiology consulted, planning right heart cath   Active Problems: Acute kidney injury on CKD stage IIIb -  Baseline creatinine 1.7-1.9 -Presented with creatinine of 2.06,-> 2.5 with diuresis, creatinine 2.43  Insulin -dependent diabetes mellitus type 2, uncontrolled with hyperglycemia -Hemoglobin A1c 7.2, outpatient on sliding scale insulin , Mounjaro CBG (last 3)  Recent Labs    08/01/23 1719 08/01/23 2144 08/02/23 0726  GLUCAP 132* 140* 137*   -Placed on moderate sliding scale insulin      Hyperlipidemia associated with type 2 diabetes mellitus (HCC) -Continue Lipitor  Hypothyroidism -Continue Synthroid   History of pulmonary embolism, July 2024 -Continue eliquis     Hx of right BKA (HCC), chronic pain in the right hip -Noted, continue oxycodone  as needed  morbid obesity Estimated body mass index is 58.36 kg/m as calculated from the following:   Height as of this encounter: 5' 6 (1.676 m).   Weight as of 03/21/23: 164 kg.  Code Status: Full CODE STATUS DVT Prophylaxis:  SCDs Start: 08/01/23 1400 apixaban  (ELIQUIS ) tablet 5 mg   Level of Care: Level of care: Telemetry Family Communication: Updated patient Disposition Plan:      Remains inpatient appropriate:      Procedures:    Consultants:   Cardiology  Antimicrobials:   Anti-infectives (From  admission, onward)    None          Medications  apixaban   5 mg Oral BID   atorvastatin   40 mg Oral q1800   insulin  aspart  0-15 Units Subcutaneous TID WC   insulin  aspart  0-5 Units Subcutaneous QHS   levothyroxine   75 mcg Oral Q0600      Subjective:   Karen Bennett was seen and examined today.  States has noted abdominal bloating, lower extremity swelling, shortness of breath, orthopnea in the last 2 weeks.  Felt slightly better after receiving Lasix  on admission.  Has chronic pain in the right hip, due to prior fracture  Objective:   Vitals:   08/01/23 2144 08/02/23 0145 08/02/23 0559 08/02/23 0927  BP: 116/68 (!) 100/56 113/66 117/70  Pulse: 84 80 75 77  Resp: 20 18 18    Temp: 98 F (36.7 C) (!) 97.5  F (36.4 C) 98.1 F (36.7 C)   TempSrc: Oral Oral Oral   SpO2: 93% 94% 95%   Height:        Intake/Output Summary (Last 24 hours) at 08/02/2023 1138 Last data filed at 08/02/2023 9371 Gross per 24 hour  Intake 720 ml  Output 1000 ml  Net -280 ml     Wt Readings from Last 3 Encounters:  03/21/23 (!) 164 kg  03/13/23 (!) 181.4 kg  03/05/23 (!) 186.4 kg     Exam General: Alert and oriented x 3, NAD Cardiovascular: S1 S2 auscultated,  RRR Respiratory: CTAB anteriorly Gastrointestinal: Soft, nontender, nondistended, + bowel sounds, morbidly Ext: +  pedal edema bilaterally, difficult to assess with body habitus, right BKA Neuro: moving all extremities, right BKA Psych: Normal affect     Data Reviewed:  I have personally reviewed following labs    CBC Lab Results  Component Value Date   WBC 5.0 08/02/2023   RBC 4.09 08/02/2023   HGB 11.4 (L) 08/02/2023   HCT 37.4 08/02/2023   MCV 91.4 08/02/2023   MCH 27.9 08/02/2023   PLT 221 08/02/2023   MCHC 30.5 08/02/2023   RDW 13.4 08/02/2023   LYMPHSABS 1.0 08/01/2023   MONOABS 0.4 08/01/2023   EOSABS 0.1 08/01/2023   BASOSABS 0.0 08/01/2023     Last metabolic panel Lab Results  Component Value Date   NA 131 (L) 08/02/2023   K 4.2 08/02/2023   CL 100 08/02/2023   CO2 22 08/02/2023   BUN 66 (H) 08/02/2023   CREATININE 2.43 (H) 08/02/2023   GLUCOSE 129 (H) 08/02/2023   GFRNONAA 24 (L) 08/02/2023   GFRAA >89 09/01/2013   CALCIUM  8.3 (L) 08/02/2023   PHOS 4.0 10/25/2021   PROT 7.3 08/01/2023   ALBUMIN  2.8 (L) 08/01/2023   BILITOT 1.2 08/01/2023   ALKPHOS 63 08/01/2023   AST 18 08/01/2023   ALT 11 08/01/2023   ANIONGAP 9 08/02/2023    CBG (last 3)  Recent Labs    08/01/23 1719 08/01/23 2144 08/02/23 0726  GLUCAP 132* 140* 137*      Coagulation Profile: No results for input(s): INR, PROTIME in the last 168 hours.   Radiology Studies: I have personally reviewed the imaging studies  DG Chest  Portable 1 View Result Date: 08/01/2023 CLINICAL DATA:  Shortness of breath and leg swelling. EXAM: PORTABLE CHEST 1 VIEW COMPARISON:  Chest x-ray dated February 09, 2023. CT chest dated January 25, 2023. FINDINGS: Unchanged cardiomegaly. Pulmonary vascular congestion. Similar hazy density at both lung bases likely reflecting layering small pleural effusions with  associated bibasilar atelectasis. No pneumothorax. No acute osseous abnormality. IMPRESSION: 1. Mild congestive heart failure. Electronically Signed   By: Elsie ONEIDA Shoulder M.D.   On: 08/01/2023 12:27       Daisee Centner M.D. [facility] 08/02/2023, 11:38 AM  Available via Epic secure chat 7am-7pm After 7 pm, please refer to night coverage provider listed on amion.

## 2023-08-02 NOTE — Consult Note (Addendum)
 Cardiology Consultation   Patient ID: Karen Bennett MRN: [id number]; DOB: 1975/10/16  Admit date: 08/01/2023 Date of Consult: 08/02/2023  PCP:  Suanne Pfeiffer, NP   [facility] Providers Cardiologist: New to Dr. Raford  Patient Profile:   Karen Bennett is a 48 y.o. 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 Davia.  History of Present Illness:   [name] was above past medical history presented to the ER yesterday complaining swelling of bilateral lower extremity and abdomen.  Patient states she has noted 2 weeks onset of abdominal and bilateral lower extremity swelling.  She had called her primary care who prescribed Lasix  initially.  She felt this was not improving the symptoms.  She was then given torsemide  40 mg 3 times daily.  She felt this is not improving the symptoms as well.  She noted a few days onset of shortness of breath with exertion, orthopnea, generalized fatigue.  Therefore she came to the ER for further evaluation.  She states she was never informed that she has heart failure.  She reports having a hole in her heart and takes anticoagulation for this.  She does not follow a cardiologist regularly.  She denied history of heart attack.  She reports mother had history of MI and stroke at the age of 57s.  She has quit smoking 6 months ago, has smoked 1 pack/day for 20 years.  She denied any family history of cardiomyopathy/CHF.  She denied any known lung disease or OSA.  She denied ever having any chest pain.   Per chart review, patient had 4 hospitalizations in 2024.     She was hospitalized for acute on chronic HFpEF with right pleural effusion 12/2022.  Echo from 01/12/2023 revealed LVEF 50 to 55%, no RWMA, grade 2 DD, RV pressure overload, PASP elevated at 62.9 mmHg, mild to moderate MR. BNP 241.  High sensitive troponin negative.  She was diuresed with IV Lasix  and transition to 20 mg every other day.  She was noted sinus bradycardia and home metoprolol  was stopped at the time.    She was hospitalized 01/2023 for acute PE with no right heart strain due to noncompliance with Xarelto  for 1 month.  She had known DVT/PE was on chronic Xarelto  prior to that, unclear if this was provoked.    She was hospitalized again 02/2023 for leg pain and weakness, found to have central canal stenosis without compression on CT lumbar.  Felt fluid overloaded and given IV Lasix , this was complicated by developing AKI on CKD stage IIIa.    She was last hospitalized 03/21/2023, found to have right inferior cerebellar ischemic stroke.  Echo bubble study from 03/22/2023 showed LVEF 45%, apical hypokinesis, mild MR, possible small intrapulmonary shunt.  TEE 03/23/2023 revealed LVEF 45 to 50%, RV pressure overload, mildly reduced RV systolic function, mild RV enlargement, mild LAE, no LA/LAA thrombus, mild RAE, mild MR, aortic sclerosis, bubble study was positive for interatrial shunt.  She was seen by neurology, stroke etiology was felt paradoxical embolic from PFO.  Her PTA Xarelto  was switched to Eliquis  for CVA prophylaxis and recent PE.  She was also found to have LDL of 238  and hemoglobin A1c 10.7%.  She has never seen a cardiologist in the past.  TEE was completed 12/2020 due to Candida albicans fungemia.  At the time LVEF was 60 to 65%, no regional motion abnormality, normal RV, no LA/LAA thrombus, mitral valve vegetation was seen, mild to moderate MR, mild to moderate TR, and trivial AI.  She had a subsequent echo completed 02/05/2021 revealing LVEF down to 50 to 55%, grade 2 DD and limited  review of RV, no evidence of MR, moderate TR.       Past Medical History:  Diagnosis Date   Anxiety and depression 06/26/2013   Cellulitis    Chronic kidney disease, stage 3a (HCC) 01/11/2023   Diabetes mellitus    Hyperlipidemia associated with type 2 diabetes mellitus (HCC) 05/02/2013   Hypertension associated with diabetes (HCC) 12/27/2020   Hypothyroidism    Infective endocarditis    PE (pulmonary embolism) ~2007-2008   Not on anticoagulation    Past Surgical History:  Procedure Laterality Date   Abscess removal from L groin     APPLICATION OF WOUND VAC Left 05/01/2013   Procedure: APPLICATION OF WOUND VAC;  Surgeon: Lynwood FORBES Better, MD;  Location: [facility];  Service: Orthopedics;  Laterality: Left;   BELOW KNEE LEG AMPUTATION Right    CYSTOSCOPY W/ URETERAL STENT PLACEMENT Right 09/03/2021   Procedure: CYSTOSCOPY WITH RETROGRADE PYELOGRAM/URETERAL STENT PLACEMENT;  Surgeon: Matilda Senior, MD;  Location: [facility];  Service: Urology;  Laterality: Right;   I & D EXTREMITY  11/12/2011   Procedure: IRRIGATION AND DEBRIDEMENT EXTREMITY;  Surgeon: Lonni CINDERELLA Poli, MD;  Location: [facility];  Service: Orthopedics;  Laterality: Left;  foot left   I & D EXTREMITY Left 09/06/2012   Procedure: IRRIGATION AND DEBRIDEMENT EXTREMITY;  Surgeon: Oneil JAYSON Herald, MD;  Location: [facility];  Service: Orthopedics;  Laterality: Left;   I & D EXTREMITY Left 05/01/2013   Procedure: INCISION AND DRAINAGE LEFT FOREFOOT ABCESS ;  Surgeon: Lynwood FORBES Better, MD;  Location: [facility];  Service: Orthopedics;  Laterality: Left;   INCISION AND DRAINAGE ABSCESS Left 12/27/2020   Procedure: INCISION AND DRAINAGE LEFT THIGH ABSCESS;  Surgeon: Signe Mitzie LABOR, MD;  Location: [facility];  Service: General;  Laterality: Left;   IR FLUORO GUIDE CV LINE RIGHT  09/12/2021   IR PTA VENOUS EXCEPT DIALYSIS CIRCUIT  01/04/2021   IR RADIOLOGIST EVAL & MGMT  10/25/2021   IR US  GUIDE VASC ACCESS RIGHT  09/12/2021   IR VENO/EXT/UNI  RIGHT  01/04/2021   Surgery to remove hematoma in L leg     TEE WITHOUT CARDIOVERSION N/A 01/03/2021   Procedure: TRANSESOPHAGEAL ECHOCARDIOGRAM (TEE);  Surgeon: Shlomo Wilbert SAUNDERS, MD;  Location: [facility]OSCOPY;  Service: Cardiovascular;  Laterality: N/A;   TEE WITHOUT CARDIOVERSION N/A 03/23/2023   Procedure: TRANSESOPHAGEAL ECHOCARDIOGRAM;  Surgeon: Kate Lonni CROME, MD;  Location: [facility] INVASIVE CV LAB;  Service: Cardiovascular;  Laterality: N/A;   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     Home Medications:  Prior to Admission medications   Medication Sig Start Date End Date Taking? Authorizing Provider  apixaban  (ELIQUIS ) 5 MG TABS tablet Take 1 tablet (5 mg total) by mouth 2 (two) times daily. 03/23/23  Yes Samtani, Jai-Gurmukh, MD  atorvastatin  (LIPITOR) 40 MG tablet Take 1 tablet (40 mg total) by mouth daily at 6 PM. 03/23/23 08/01/23 Yes Samtani, Jai-Gurmukh, MD  Continuous Glucose Sensor (FREESTYLE LIBRE 3 SENSOR) MISC Place 1 sensor on  the skin every 14 days. Use to check glucose continuously 03/23/23  Yes Samtani, Jai-Gurmukh, MD  hydrOXYzine  (VISTARIL ) 50 MG capsule Take 50 mg by mouth 2 (two) times daily as needed for itching or anxiety.   Yes [provider]  insulin  lispro (HUMALOG ) 100 UNIT/ML injection Inject 1-6 Units into the skin See admin instructions. Inject 1-6 units into the skin three times a day with meals, PER SLIDING SCALE   Yes [provider]  JARDIANCE 25 MG TABS tablet Take 25 mg by mouth daily.   Yes [provider]  LANTUS  SOLOSTAR 100 UNIT/ML Solostar Pen Inject 10 Units into the skin at bedtime.   Yes [provider]  levothyroxine  (SYNTHROID ) 75 MCG tablet Take 75 mcg by mouth daily before breakfast.   Yes [provider]  methocarbamol  (ROBAXIN ) 750 MG tablet Take 750 mg by mouth every 8 (eight) hours as needed for muscle spasms. 07/09/23  Yes [provider]  minoxidil  (LONITEN ) 10 MG tablet Take 5 mg by mouth  daily.   Yes [provider]  MOUNJARO 2.5 MG/0.5ML Pen Inject 2.5 mg into the skin every Wednesday.   Yes [provider]  Oxycodone  HCl 10 MG TABS Take 1 tablet (10 mg total) by mouth every 12 (twelve) hours as needed. Patient taking differently: Take 10 mg by mouth in the morning, at noon, and at bedtime. 03/13/23  Yes Garrick Charleston, MD  potassium chloride  (KLOR-CON  M) 10 MEQ tablet Take 10 mEq by mouth in the morning and at bedtime. 07/16/23  Yes [provider]  promethazine  (PHENERGAN ) 25 MG tablet Take 25 mg by mouth every 6 (six) hours as needed for nausea or vomiting.   Yes [provider]  torsemide  (DEMADEX ) 20 MG tablet Take 20 mg by mouth in the morning, at noon, and at bedtime. 07/26/23  Yes [provider]  traZODone  (DESYREL ) 50 MG tablet Take 50 mg by mouth at bedtime.   Yes [provider]  triamcinolone cream (KENALOG) 0.5 % Apply 1 Application topically 3 (three) times daily as needed (for itching or irritation- affected areas).   Yes [provider]  TYLENOL  500 MG tablet Take 500-1,000 mg by mouth every 6 (six) hours as needed for mild pain (pain score 1-3) or headache.   Yes [provider]  venlafaxine  XR (EFFEXOR -XR) 37.5 MG 24 hr capsule Take 37.5 mg by mouth daily. 06/27/23  Yes [provider]  furosemide  (LASIX ) 20 MG tablet Take 1 tablet (20 mg total) by mouth every other day. Patient not taking: Reported on 08/01/2023 02/23/23 08/01/23  Briana Elgin LABOR, MD  glucose blood test strip Use as instructed 06/26/13   Colton Drape, MD  glucose monitoring kit (FREESTYLE) monitoring kit 1 each by Does not apply route as needed for other. 06/26/13   Advani, Deepak, MD  insulin  glargine (SEMGLEE , YFGN,) 100 UNIT/ML Solostar Pen Inject 10 Units into the skin at bedtime. Patient not taking: Reported on 08/01/2023 03/23/23   Samtani, Jai-Gurmukh, MD  levothyroxine  (SYNTHROID ) 50 MCG tablet Take 1 tablet (50 mcg  total) by mouth daily at 6 (six) AM. Patient not taking: Reported on 03/22/2023 02/23/23   Briana Elgin LABOR, MD    Inpatient Medications: Scheduled Meds:  apixaban   5 mg Oral BID   atorvastatin   40 mg Oral q1800   furosemide   60 mg Intravenous BID   insulin  aspart  0-15 Units Subcutaneous TID WC   insulin  aspart  0-5 Units Subcutaneous QHS  levothyroxine   75 mcg Oral Q0600   Continuous Infusions:  PRN Meds: acetaminophen  **OR** acetaminophen , albuterol , mouth rinse, oxyCODONE , traZODone   Allergies:    Allergies  Allergen Reactions   Clindamycin  Diarrhea and Nausea And Vomiting   Zofran  Itching and Nausea And Vomiting   Doxycycline Diarrhea and Nausea And Vomiting   Morphine  And Codeine Hives, Itching and Rash    Social History:   Social History   Socioeconomic History   Marital status: Single    Spouse name: Not on file   Number of children: Not on file   Years of education: Not on file   Highest education level: Not on file  Occupational History   Not on file  Tobacco Use   Smoking status: Some Days    Types: Cigarettes    Passive exposure: Never   Smokeless tobacco: Never  Vaping Use   Vaping status: Never Used  Substance and Sexual Activity   Alcohol use: No   Drug use: No   Sexual activity: Never  Other Topics Concern   Not on file  Social History Narrative   Lives at home on her own and is ambulatory, is single and has no children, no family who checks up on her or helps her, and only one friend who helps with daily activities when she can. She is a never smoker.    Social Drivers of Corporate Investment Banker Strain: Low Risk  (05/07/2023)   Received from [facility]   Overall Financial Resource Strain (CARDIA)    Difficulty of Paying Living Expenses: Not very hard  Recent Concern: Financial Resource Strain - High Risk (03/21/2023)   Received from [facility]   Overall Financial Resource Strain (CARDIA)    Difficulty of Paying Living Expenses:  Very hard  Food Insecurity: No Food Insecurity (08/01/2023)   Hunger Vital Sign    Worried About Running Out of Food in the Last Year: Never true    Ran Out of Food in the Last Year: Never true  Transportation Needs: No Transportation Needs (08/01/2023)   PRAPARE - Administrator, Civil Service (Medical): No    Lack of Transportation (Non-Medical): No  Physical Activity: Sufficiently Active (05/07/2023)   Received from [facility]   Exercise Vital Sign    Days of Exercise per Week: 4 days    Minutes of Exercise per Session: 60 min  Stress: Stress Concern Present (05/07/2023)   Received from [facility] - Occupational Stress Questionnaire    Feeling of Stress : To some extent  Social Connections: Moderately Integrated (05/07/2023)   Received from [facility]   Social Network    How would you rate your social network (family, work, friends)?: Adequate participation with social networks  Intimate Partner Violence: Not At Risk (08/01/2023)   Humiliation, Afraid, Rape, and Kick questionnaire    Fear of Current or Ex-Partner: No    Emotionally Abused: No    Physically Abused: No    Sexually Abused: No    Family History:    Family History  Problem Relation Age of Onset   Diabetes Mother    Coronary artery disease Mother    Stroke Mother      ROS:  Constitutional: Denied fever, chills, malaise, night sweats Eyes: Denied vision change or loss Ears/Nose/Mouth/Throat: Denied ear ache, sore throat, coughing, sinus pain Cardiovascular: See HPI Respiratory: See HPI Gastrointestinal: Denied nausea, vomiting, abdominal pain, diarrhea Genital/Urinary: Denied dysuria, hematuria, urinary  frequency/urgency Musculoskeletal: Denied muscle ache, joint pain, weakness Skin: Denied rash, wound Neuro: Denied headache, dizziness, syncope Psych: history of depression/anxiety  Endocrine: history of diabetes   Physical Exam/Data:   Vitals:    08/01/23 2144 08/02/23 0145 08/02/23 0559 08/02/23 0927  BP: 116/68 (!) 100/56 113/66 117/70  Pulse: 84 80 75 77  Resp: 20 18 18    Temp: 98 F (36.7 C) (!) 97.5 F (36.4 C) 98.1 F (36.7 C)   TempSrc: Oral Oral Oral   SpO2: 93% 94% 95%   Height:        Intake/Output Summary (Last 24 hours) at 08/02/2023 0956 Last data filed at 08/02/2023 9371 Gross per 24 hour  Intake 720 ml  Output 1000 ml  Net -280 ml      03/21/2023    6:33 PM 03/21/2023    5:00 PM 03/13/2023    9:03 AM  Last 3 Weights  Weight (lbs) 361 lb 8.9 oz 361 lb 8.9 oz 400 lb  Weight (kg) 164 kg 164 kg 181.439 kg     Body mass index is 58.36 kg/m.   Vitals:  Vitals:   08/02/23 0559 08/02/23 0927  BP: 113/66 117/70  Pulse: 75 77  Resp: 18   Temp: 98.1 F (36.7 C)   SpO2: 95%    General Appearance: In no apparent distress, laying in bed, pleasant, super morbid obesity HEENT: Normocephalic, atraumatic.  Neck: Supple, trachea midline, no JVDs Cardiovascular: Regular rate and rhythm, normal S1-S2, no murmur Respiratory: Resting breathing unlabored, lungs sounds clear to auscultation bilaterally, no use of accessory muscles. On room air.  No wheezes, rales or rhonchi.   Gastrointestinal: Bowel sounds positive, abdomen soft, non-tender, non-distended.   Extremities: No significant pitting leg edema noted  Musculoskeletal: Normal muscle bulk and tone Skin: Intact, warm, dry. No rashes or petechiae noted in exposed areas.  Neurologic: Alert, oriented to person, place and time. no gross focal neuro deficit Psychiatric: Normal affect. Mood is appropriate.     EKG:  The EKG was personally reviewed and demonstrates:    EKG from 08/01/2023 at 10:41 revealed sinus rhythm 83 bpm, RBBB  Telemetry:  Telemetry was personally reviewed and demonstrates:    Sinus rhythm 80s, first-degree AV block  Relevant CV Studies:   TEE 03/23/23:  1. Left ventricular ejection fraction, by estimation, is 45 to 50%. The  left  ventricle has mildly decreased function. There is the  interventricular septum is flattened in systole, consistent with right  ventricular pressure overload.   2. Right ventricular systolic function is mildly reduced. The right  ventricular size is mildly enlarged.   3. Left atrial size was mildly dilated. No left atrial/left atrial  appendage thrombus was detected.   4. Right atrial size was mildly dilated.   5. The mitral valve is normal in structure. Mild mitral valve  regurgitation.   6. The aortic valve is tricuspid. Aortic valve regurgitation is trivial.  Aortic valve sclerosis/calcification is present, without any evidence of  aortic stenosis.   7. Agitated saline contrast bubble study was positive with shunting  observed within 3-6 cardiac cycles suggestive of interatrial shunt.   Echo 01/12/2023:   1. Left ventricular ejection fraction, by estimation, is 50 to 55%. The  left ventricle has low normal function. The left ventricle has no regional  wall motion abnormalities. Left ventricular diastolic parameters are  consistent with Grade II diastolic  dysfunction (pseudonormalization). There is abnormal septal motion and the  interventricular septum is flattened in  systole, consistent with right  ventricular pressure overload.   2. Right ventricular systolic function is normal. The right ventricular  size is mildly enlarged. There is severely elevated pulmonary artery  systolic pressure. The estimated right ventricular systolic pressure is  62.9 mmHg.   3. The mitral valve is abnormal. Mild to moderate mitral valve  regurgitation. No evidence of mitral stenosis.   4. The aortic valve is normal in structure. Aortic valve regurgitation is  not visualized. No aortic stenosis is present.   5. The inferior vena cava is dilated in size with <50% respiratory  variability, suggesting right atrial pressure of 15 mmHg.    Laboratory Data:  High Sensitivity Troponin:   Recent Labs   Lab 08/01/23 1140 08/01/23 1414  TROPONINIHS 33* 36*     Chemistry Recent Labs  Lab 08/01/23 1140 08/01/23 1501 08/02/23 0456  NA 132* 137 131*  K 5.1 4.3 4.2  CL 104 102 100  CO2 21*  --  22  GLUCOSE 151* 132* 129*  BUN 67* 56* 66*  CREATININE 2.06* 2.50* 2.43*  CALCIUM  8.5*  --  8.3*  GFRNONAA 30*  --  24*  ANIONGAP 7  --  9    Recent Labs  Lab 08/01/23 1140  PROT 7.3  ALBUMIN  2.8*  AST 18  ALT 11  ALKPHOS 63  BILITOT 1.2   Lipids No results for input(s): CHOL, TRIG, HDL, LABVLDL, LDLCALC, CHOLHDL in the last 168 hours.  Hematology Recent Labs  Lab 08/01/23 1140 08/01/23 1501 08/02/23 0456  WBC 6.3  --  5.0  RBC 4.32  --  4.09  HGB 12.3 12.9 11.4*  HCT 39.1 38.0 37.4  MCV 90.5  --  91.4  MCH 28.5  --  27.9  MCHC 31.5  --  30.5  RDW 13.5  --  13.4  PLT 227  --  221   Thyroid  No results for input(s): TSH, FREET4 in the last 168 hours.  BNP Recent Labs  Lab 08/01/23 1140  BNP 523.3*    DDimer No results for input(s): DDIMER in the last 168 hours.   Radiology/Studies:  DG Chest Portable 1 View Result Date: 08/01/2023 CLINICAL DATA:  Shortness of breath and leg swelling. EXAM: PORTABLE CHEST 1 VIEW COMPARISON:  Chest x-ray dated February 09, 2023. CT chest dated January 25, 2023. FINDINGS: Unchanged cardiomegaly. Pulmonary vascular congestion. Similar hazy density at both lung bases likely reflecting layering small pleural effusions with associated bibasilar atelectasis. No pneumothorax. No acute osseous abnormality. IMPRESSION: 1. Mild congestive heart failure. Electronically Signed   By: Elsie ONEIDA Shoulder M.D.   On: 08/01/2023 12:27     Assessment and Plan:   Acute on chronic heart failure with mildly reduced EF Pulmonary hypertension Elevated troponin History of mitral valve endocarditis 2022 -Patient denied any known history of CHF, felt new onset bilateral lower extremity edema with orthopnea and DOE over the past 2 weeks, reports  started Lasix  which transitioned to torsemide  by PCP recently -Per chart review, she had diagnosis of HFpEF by internal medicine 12/2022 hospitalization, where she was admitted for acute CHF, diuresed with IV Lasix  and transition to Lasix  20 mg every other day -Numerous echo have been obtained over the past years during hospitalizations for osteomyelitis, fungemia, mitral valve endocarditis , CHF , UTI , stroke, etc. LVEF 60 to 65% dating back 12/2020, down to 50 to 55% 01/2021, down to 45 to 50% 02/2023 -Most recent TTE 03/23/2023 was performed due to ischemic stroke, revealing LVEF  45 to 50%, RV pressure overload, mildly reduced RV systolic function, mild RV enlargement, mild LAE, no LA/LAA thrombus, mild RAE, mild MR, trivial AI, aortic sclerosis, bubble study positive for interatrial shunt suggest PFO.  -BNP 523, high sensitive troponin 33>36, CXR with mild CHF POA -She is currently receiving IV Lasix  60 mg twice daily, reports taking torsemide  40mg  TID at home, urine output 1 L over the past 24 hours, no weight records yet.  Clinically she does not appear significant volume overloaded on exam, although body habitus is limiting exam.  Blood work revealing worsening AKI with creatinine 2.06 >2.5 >2.43.  -Given difficulty with clinical exam and volume status , RV pressure overload, worsening AKI, recommend hold further diuresis, right heart catheterization tomorrow -High sensitive troponin mildly elevated, likely demand ischemia, she has no prior ischemic workup, she has significant risk factor for CAD, giving gradual declining LVEF, will consider ischemic evaluation, if AKI improves, may proceed with gated CT versus LHC -GDMT: History of sinus bradycardia on beta-blocker, will avoid;  BP low normal, will hold off adding additional agent, if improving, would add Entresto; given history of UTI and body habitus, unlikely a great candidate for SGLT2i -Recommend outpatient sleep study for evaluation of OSA and PFT  for evaluation of underlying lung disease  AKI on CKD stage III Super morbid obesity History of DVT and PE Type 2 diabetes History of ischemic stroke 02/2023 - per primary team        Risk Assessment/Risk Scores:   [place]  Heart Association (NYHA) Functional Class NYHA Class II        For questions or updates, please contact [facility] Please consult [email or web address] for contact info under    Signed, [name], NP  08/02/2023 9:56 AM

## 2023-08-02 NOTE — Progress Notes (Signed)
  Echocardiogram 2D Echocardiogram has been performed.  Karen Bennett 08/02/2023, 1:17 PM

## 2023-08-02 NOTE — TOC Initial Note (Signed)
 Transition of Care [facility]) - Initial/Assessment Note    Patient Details  Name: Karen Bennett MRN: [id number] Date of Birth: April 22, 1976  Transition of Care [facility]) CM/SW Contact:    Bascom Service, RN Phone Number: 08/02/2023, 1:28 PM  Clinical Narrative:d/c plan home.                   Expected Discharge Plan: Home/Self Care Barriers to Discharge: Continued Medical Work up   Patient Goals and CMS Choice Patient states their goals for this hospitalization and ongoing recovery are:: Home   Choice offered to / list presented to : Patient [place] ownership interest in [facility].provided to:: Patient    Expected Discharge Plan and Services   Discharge Planning Services: CM Consult   Living arrangements for the past 2 months: Single Family Home                                      Prior Living Arrangements/Services Living arrangements for the past 2 months: Single Family Home Lives with:: Self                   Activities of Daily Living   ADL Screening (condition at time of admission) Independently performs ADLs?: No Does the patient have a NEW difficulty with bathing/dressing/toileting/self-feeding that is expected to last >3 days?: No Does the patient have a NEW difficulty with getting in/out of bed, walking, or climbing stairs that is expected to last >3 days?: No Does the patient have a NEW difficulty with communication that is expected to last >3 days?: No Is the patient deaf or have difficulty hearing?: No Does the patient have difficulty seeing, even when wearing glasses/contacts?: No Does the patient have difficulty concentrating, remembering, or making decisions?: No  Permission Sought/Granted                  Emotional Assessment              Admission diagnosis:  Acute diastolic (congestive) heart failure (HCC) [I50.31] Patient Active Problem List   Diagnosis Date Noted   Acute diastolic (congestive) heart failure  (HCC) 08/01/2023   Acute CVA (cerebrovascular accident) (HCC) 03/23/2023   Aphasia 03/21/2023   QT prolongation 03/21/2023   CKD (chronic kidney disease) 02/09/2023   Paraplegia (HCC) 02/09/2023   Acute pulmonary embolism (HCC) 01/25/2023   Acute on chronic heart failure with preserved ejection fraction (HFpEF) (HCC) 01/11/2023   Chronic kidney disease, stage 3a (HCC) 01/11/2023   Acute pain of right lower extremity 07/04/2022   History of femur fracture 02/23/2022   Bacteremia 02/22/2022   Hypothyroidism    Hyperglycemia 10/24/2021   Hx of bacterial endocarditis 10/24/2021   Pyuria 10/24/2021   Candida rash of groin 09/11/2021   Pressure injury of back, stage 2 (HCC) 09/05/2021   Obstruction of right ureteropelvic junction (UPJ) due to stone 09/04/2021   Sepsis secondary to UTI (HCC) 09/03/2021   Diabetic hyperosmolar non-ketotic state (HCC) 09/03/2021   Stenosis of subclavian artery (HCC) 02/09/2021   Sepsis (HCC) 02/01/2021   Mixed hyperlipidemia 01/12/2021   Infective endocarditis    Pressure injury of skin 12/28/2020   BMI 60.0-69.9, adult (HCC) 12/27/2020   Hypertension associated with diabetes (HCC) 12/27/2020   History of pulmonary embolism 12/27/2020   Necrotizing fasciitis of lower leg (HCC) Thigh  12/27/2020   Cellulitis 12/26/2020   History of DVT (deep  vein thrombosis) 11/12/2020   Tobacco abuse 12/31/2017   Hx of right BKA (HCC) 12/31/2017   Closed fracture of right distal femur (HCC) 12/31/2017   Hirsutism 06/21/2016   Noncompliance with treatment plan 04/19/2015   Anxiety and depression 06/26/2013   Normocytic anemia 05/22/2013   Depression 05/22/2013   Grief reaction 05/22/2013   AKI (acute kidney injury) (HCC) 05/07/2013   Phantom limb pain (HCC) 05/06/2013   Hyperlipidemia associated with type 2 diabetes mellitus (HCC) 05/02/2013   Hypokalemia 05/01/2013   Diabetic peripheral neuropathy associated with type 2 diabetes mellitus (HCC) 04/30/2013     Class: Chronic   MSSA (methicillin susceptible Staphylococcus aureus) 09/06/2012   Foot osteomyelitis, left (HCC) 09/03/2012   Pulmonary emboli (HCC) 11/11/2011   Hyponatremia 09/05/2011   Diabetic foot ulcer (HCC) 09/05/2011   Obesity, Class III, BMI 40-49.9 (morbid obesity) (HCC) 08/15/2011   Insulin  dependent type 2 diabetes mellitus (HCC) 06/12/2007   PCP:  Suanne Pfeiffer, NP Pharmacy:   [id number] - [place], [place] - [address] AT [facility] [address] [place] [address] Phone: [phone] Fax: [phone]     Social Drivers of Health (SDOH) Social History: SDOH Screenings   Food Insecurity: No Food Insecurity (08/01/2023)  Housing: Low Risk  (08/01/2023)  Transportation Needs: No Transportation Needs (08/01/2023)  Utilities: Not At Risk (08/01/2023)  Financial Resource Strain: Low Risk  (05/07/2023)   Received from [facility]  Recent Concern: Financial Resource Strain - High Risk (03/21/2023)   Received from [facility]  Physical Activity: Sufficiently Active (05/07/2023)   Received from [facility]  Social Connections: Moderately Integrated (05/07/2023)   Received from [facility]  Stress: Stress Concern Present (05/07/2023)   Received from [facility]  Tobacco Use: High Risk (08/02/2023)   SDOH Interventions:     Readmission Risk Interventions    02/13/2023   11:23 AM 01/26/2023    3:33 PM 01/16/2023   11:06 AM  Readmission Risk Prevention Plan  Transportation Screening Complete Complete Complete  PCP or Specialist Appt within 5-7 Days   Complete  PCP or Specialist Appt within 3-5 Days Complete Complete   Home Care Screening   Complete  Medication Review (RN CM)   Complete  HRI or Home Care Consult Complete Complete   Social Work Consult for Recovery Care Planning/Counseling Complete Complete   Palliative Care Screening Complete Not Applicable   Medication Review Oceanographer) Complete  Complete

## 2023-08-03 DIAGNOSIS — E1169 Type 2 diabetes mellitus with other specified complication: Secondary | ICD-10-CM | POA: Diagnosis not present

## 2023-08-03 DIAGNOSIS — E785 Hyperlipidemia, unspecified: Secondary | ICD-10-CM

## 2023-08-03 DIAGNOSIS — I5033 Acute on chronic diastolic (congestive) heart failure: Secondary | ICD-10-CM

## 2023-08-03 DIAGNOSIS — N1831 Chronic kidney disease, stage 3a: Secondary | ICD-10-CM | POA: Diagnosis not present

## 2023-08-03 LAB — BASIC METABOLIC PANEL
Anion gap: 7 (ref 5–15)
BUN: 68 mg/dL — ABNORMAL HIGH (ref 6–20)
CO2: 23 mmol/L (ref 22–32)
Calcium: 8.5 mg/dL — ABNORMAL LOW (ref 8.9–10.3)
Chloride: 102 mmol/L (ref 98–111)
Creatinine, Ser: 2.18 mg/dL — ABNORMAL HIGH (ref 0.44–1.00)
GFR, Estimated: 28 mL/min — ABNORMAL LOW (ref >60)
Glucose, Bld: 116 mg/dL — ABNORMAL HIGH (ref 70–99)
Potassium: 4.1 mmol/L (ref 3.5–5.1)
Sodium: 132 mmol/L — ABNORMAL LOW (ref 135–145)

## 2023-08-03 LAB — GLUCOSE, CAPILLARY
Glucose-Capillary: 125 mg/dL — ABNORMAL HIGH (ref 70–99)
Glucose-Capillary: 159 mg/dL — ABNORMAL HIGH (ref 70–99)
Glucose-Capillary: 151 mg/dL — ABNORMAL HIGH (ref 70–99)
Glucose-Capillary: 123 mg/dL — ABNORMAL HIGH (ref 70–99)

## 2023-08-03 LAB — CBC
HCT: 38.9 % (ref 36.0–46.0)
Hemoglobin: 11.7 g/dL — ABNORMAL LOW (ref 12.0–15.0)
MCH: 27.6 pg (ref 26.0–34.0)
MCHC: 30.1 g/dL (ref 30.0–36.0)
MCV: 91.7 fL (ref 80.0–100.0)
Platelets: 234 10*3/uL (ref 150–400)
RBC: 4.24 MIL/uL (ref 3.87–5.11)
RDW: 13.4 % (ref 11.5–15.5)
WBC: 5.1 10*3/uL (ref 4.0–10.5)
nRBC: 0 % (ref 0.0–0.2)

## 2023-08-03 MED ORDER — FUROSEMIDE 10 MG/ML IJ SOLN
120.0000 mg | Freq: Two times a day (BID) | INTRAVENOUS | Status: DC
  Administered 2023-08-03 – 2023-08-10 (×14): 120 mg via INTRAVENOUS
  Filled 2023-08-03: qty 10
  Filled 2023-08-03 (×2): qty 12
  Filled 2023-08-03: qty 2
  Filled 2023-08-03 (×2): qty 10
  Filled 2023-08-03: qty 12
  Filled 2023-08-03 (×3): qty 10
  Filled 2023-08-03: qty 12
  Filled 2023-08-03: qty 2
  Filled 2023-08-03 (×3): qty 10
  Filled 2023-08-03: qty 12

## 2023-08-03 MED ORDER — METOLAZONE 5 MG PO TABS
5.0000 mg | ORAL_TABLET | Freq: Once | ORAL | Status: AC
  Administered 2023-08-03: 5 mg via ORAL
  Filled 2023-08-03: qty 1

## 2023-08-03 NOTE — Progress Notes (Signed)
 Rounding Note    Patient Name: Karen Bennett Date of Encounter: 08/03/2023  [facility] Cardiologist: Annabella Scarce, MD   Subjective   She reports diuresis, but states her LE swelling has not changed overnight. She is found sitting up in bed with 4 pillows, but denies orthopnea prior to admission.   Inpatient Medications    Scheduled Meds:  apixaban   5 mg Oral BID   atorvastatin   40 mg Oral q1800   Gerhardt's butt cream   Topical TID   insulin  aspart  0-15 Units Subcutaneous TID WC   insulin  aspart  0-5 Units Subcutaneous QHS   levothyroxine   75 mcg Oral Q0600   Continuous Infusions:  furosemide  100 mg (08/03/23 0908)   PRN Meds: acetaminophen  **OR** acetaminophen , albuterol , mouth rinse, oxyCODONE , traZODone    Vital Signs    Vitals:   08/02/23 0927 08/02/23 1327 08/02/23 2100 08/03/23 0523  BP: 117/70 138/80 129/62 129/83  Pulse: 77 87 80 84  Resp:  20 20 18   Temp:  (!) 97 F (36.1 C) 97.9 F (36.6 C) 97.9 F (36.6 C)  TempSrc:   Oral Oral  SpO2:  96% 96% 95%  Height:        Intake/Output Summary (Last 24 hours) at 08/03/2023 0936 Last data filed at 08/03/2023 0500 Gross per 24 hour  Intake --  Output 1000 ml  Net -1000 ml      03/21/2023    6:33 PM 03/21/2023    5:00 PM 03/13/2023    9:03 AM  Last 3 Weights  Weight (lbs) 361 lb 8.9 oz 361 lb 8.9 oz 400 lb  Weight (kg) 164 kg 164 kg 181.439 kg      Telemetry    SR with HR in the 90s, NSVT - Personally Reviewed  ECG    No new tracings - Personally Reviewed  Physical Exam   GEN: super morbid obesity, significant LE edema, right BKA  Neck: No JVD - exam difficult Cardiac: RRR, no murmurs, rubs, or gallops.  Respiratory: crackles in bases GI: Soft, nontender, non-distended  MS: B LE edema right BKA Neuro:  Nonfocal  Psych: Normal affect   Labs    High Sensitivity Troponin:   Recent Labs  Lab 08/01/23 1140 08/01/23 1414  TROPONINIHS 33* 36*     Chemistry Recent Labs   Lab 08/01/23 1140 08/01/23 1501 08/02/23 0456 08/03/23 0536  NA 132* 137 131* 132*  K 5.1 4.3 4.2 4.1  CL 104 102 100 102  CO2 21*  --  22 23  GLUCOSE 151* 132* 129* 116*  BUN 67* 56* 66* 68*  CREATININE 2.06* 2.50* 2.43* 2.18*  CALCIUM  8.5*  --  8.3* 8.5*  PROT 7.3  --   --   --   ALBUMIN  2.8*  --   --   --   AST 18  --   --   --   ALT 11  --   --   --   ALKPHOS 63  --   --   --   BILITOT 1.2  --   --   --   GFRNONAA 30*  --  24* 28*  ANIONGAP 7  --  9 7    Lipids No results for input(s): CHOL, TRIG, HDL, LABVLDL, LDLCALC, CHOLHDL in the last 168 hours.  Hematology Recent Labs  Lab 08/01/23 1140 08/01/23 1501 08/02/23 0456 08/03/23 0536  WBC 6.3  --  5.0 5.1  RBC 4.32  --  4.09 4.24  HGB 12.3 12.9 11.4* 11.7*  HCT 39.1 38.0 37.4 38.9  MCV 90.5  --  91.4 91.7  MCH 28.5  --  27.9 27.6  MCHC 31.5  --  30.5 30.1  RDW 13.5  --  13.4 13.4  PLT 227  --  221 234   Thyroid  No results for input(s): TSH, FREET4 in the last 168 hours.  BNP Recent Labs  Lab 08/01/23 1140  BNP 523.3*    DDimer No results for input(s): DDIMER in the last 168 hours.   Radiology    ECHOCARDIOGRAM COMPLETE Result Date: 08/02/2023    ECHOCARDIOGRAM REPORT   Patient Name:   Karen Bennett Date of Exam: 08/02/2023 Medical Rec #:  [id number]       Height:       66.0 in Accession #:    [id number]      Weight:       361.6 lb Date of Birth:  [birth year]      BSA:          2.572 m Patient Age:    48 years        BP:           117/70 mmHg Patient Gender: F               HR:           75 bpm. Exam Location:  Inpatient Procedure: 2D Echo, Cardiac Doppler, Color Doppler and Intracardiac            Opacification Agent Indications:    CHF acute- diastolic  History:        Patient has prior history of Echocardiogram examinations, most                 recent 03/23/2023. Risk Factors:Diabetes and Current Smoker.  Sonographer:    Ozell Free Referring Phys: [id number] RIPUDEEP K RAI  Sonographer Comments:  Technically challenging study due to limited acoustic windows and patient is obese. IMPRESSIONS  1. No apical thrombus with Definity . Left ventricular ejection fraction, by estimation, is 35 to 40%. Left ventricular ejection fraction by 2D MOD biplane is 40.8 %. The left ventricle has moderately decreased function. The left ventricle demonstrates regional wall motion abnormalities (see scoring diagram/findings for description). The left ventricular internal cavity size was mildly dilated. Left ventricular diastolic parameters are consistent with Grade III diastolic dysfunction (restrictive). Elevated left ventricular end-diastolic pressure. There is abnormal (paradoxical) septal motion, consistent with right ventricular volume overload. There is severe akinesis of the left ventricular, mid inferior wall and inferolateral wall.  2. Right ventricular systolic function is low normal. The right ventricular size is normal. There is severely elevated pulmonary artery systolic pressure. The estimated right ventricular systolic pressure is 69.5 mmHg.  3. Left atrial size was moderately dilated.  4. Right atrial size was severely dilated.  5. The mitral valve is abnormal. Mild mitral valve regurgitation.  6. The tricuspid valve is abnormal. Tricuspid valve regurgitation is moderate.  7. The aortic valve is tricuspid. Aortic valve regurgitation is not visualized. No aortic stenosis is present.  8. The inferior vena cava is dilated in size with <50% respiratory variability, suggesting right atrial pressure of 15 mmHg. Comparison(s): Changes from prior study are noted. 03/23/2023: LVEF 45% with apical hypokinesis. Conclusion(s)/Recommendation(s): Compared to the prior study, there appear to be new regional WMA's - suggestive of LCX territory ischemia/infarct. FINDINGS  Left Ventricle: No apical thrombus with Definity . Left ventricular ejection fraction, by estimation, is 35  to 40%. Left ventricular ejection fraction by 2D MOD  biplane is 40.8 %. The left ventricle has moderately decreased function. The left ventricle demonstrates regional wall motion abnormalities. Severe akinesis of the left ventricular, mid inferior wall and inferolateral wall. Definity  contrast agent was given IV to delineate the left ventricular endocardial borders. The left ventricular internal cavity size was mildly dilated. There is no left ventricular hypertrophy. Abnormal (paradoxical) septal motion, consistent with right ventricular volume overload. Left ventricular diastolic parameters are consistent with Grade III diastolic dysfunction (restrictive). Elevated left ventricular end-diastolic pressure.  LV Wall Scoring: The antero-lateral wall and posterior wall are akinetic. Right Ventricle: The right ventricular size is normal. No increase in right ventricular wall thickness. Right ventricular systolic function is low normal. There is severely elevated pulmonary artery systolic pressure. The tricuspid regurgitant velocity is 3.69 m/s, and with an assumed right atrial pressure of 15 mmHg, the estimated right ventricular systolic pressure is 69.5 mmHg. Left Atrium: Left atrial size was moderately dilated. Right Atrium: Right atrial size was severely dilated. Pericardium: There is no evidence of pericardial effusion. Mitral Valve: The mitral valve is abnormal. There is mild calcification of the anterior and posterior mitral valve leaflet(s). Mild mitral valve regurgitation. MV peak gradient, 7.6 mmHg. The mean mitral valve gradient is 3.0 mmHg. Tricuspid Valve: The tricuspid valve is abnormal. Tricuspid valve regurgitation is moderate. Aortic Valve: The aortic valve is tricuspid. Aortic valve regurgitation is not visualized. No aortic stenosis is present. Aortic valve mean gradient measures 4.0 mmHg. Aortic valve peak gradient measures 7.4 mmHg. Aortic valve area, by VTI measures 1.70 cm. Pulmonic Valve: The pulmonic valve was grossly normal. Pulmonic valve  regurgitation is trivial. Aorta: The aortic root and ascending aorta are structurally normal, with no evidence of dilitation. Venous: The inferior vena cava is dilated in size with less than 50% respiratory variability, suggesting right atrial pressure of 15 mmHg. IAS/Shunts: No atrial level shunt detected by color flow Doppler.  LEFT VENTRICLE PLAX 2D                        Biplane EF (MOD) LVIDd:         5.70 cm         LV Biplane EF:   Left LVIDs:         4.50 cm                          ventricular LV PW:         0.90 cm                          ejection LV IVS:        1.00 cm                          fraction by LVOT diam:     2.00 cm                          2D MOD LV SV:         41                               biplane is LV SV Index:   16  40.8 %. LVOT Area:     3.14 cm                                Diastology                                LV e' medial:    4.57 cm/s LV Volumes (MOD)               LV E/e' medial:  26.7 LV vol d, MOD    233.0 ml      LV e' lateral:   7.18 cm/s A2C:                           LV E/e' lateral: 17.0 LV vol d, MOD    219.0 ml A4C: LV vol s, MOD    139.0 ml A2C: LV vol s, MOD    119.0 ml A4C: LV SV MOD A2C:   94.0 ml LV SV MOD A4C:   219.0 ml LV SV MOD BP:    93.7 ml RIGHT VENTRICLE             IVC RV S prime:     10.30 cm/s  IVC diam: 2.60 cm TAPSE (M-mode): 2.2 cm LEFT ATRIUM              Index        RIGHT ATRIUM           Index LA diam:        4.70 cm  1.83 cm/m   RA Area:     39.90 cm LA Vol (A2C):   95.4 ml  37.09 ml/m  RA Volume:   170.00 ml 66.10 ml/m LA Vol (A4C):   99.0 ml  38.49 ml/m LA Biplane Vol: 106.0 ml 41.21 ml/m  AORTIC VALVE AV Area (Vmax):    1.72 cm AV Area (Vmean):   1.68 cm AV Area (VTI):     1.70 cm AV Vmax:           136.00 cm/s AV Vmean:          90.700 cm/s AV VTI:            0.244 m AV Peak Grad:      7.4 mmHg AV Mean Grad:      4.0 mmHg LVOT Vmax:         74.40 cm/s LVOT Vmean:        48.500 cm/s LVOT VTI:           0.132 m LVOT/AV VTI ratio: 0.54  AORTA Ao Root diam: 3.00 cm Ao Asc diam:  2.70 cm MITRAL VALVE                TRICUSPID VALVE MV Area (PHT): 3.08 cm     TR Peak grad:   54.5 mmHg MV Area VTI:   1.26 cm     TR Vmax:        369.00 cm/s MV Peak grad:  7.6 mmHg MV Mean grad:  3.0 mmHg     SHUNTS MV Vmax:       1.38 m/s     Systemic VTI:  0.13 m MV Vmean:      80.5 cm/s    Systemic Diam: 2.00 cm MV Decel Time: 246 msec MR Peak grad: 67.6  mmHg MR Vmax:      411.00 cm/s MV E velocity: 122.00 cm/s MV A velocity: 69.80 cm/s MV E/A ratio:  1.75 Vinie Maxcy MD Electronically signed by Vinie Maxcy MD Signature Date/Time: 08/02/2023/4:23:45 PM    Final    DG Chest Portable 1 View Result Date: 08/01/2023 CLINICAL DATA:  Shortness of breath and leg swelling. EXAM: PORTABLE CHEST 1 VIEW COMPARISON:  Chest x-ray dated February 09, 2023. CT chest dated January 25, 2023. FINDINGS: Unchanged cardiomegaly. Pulmonary vascular congestion. Similar hazy density at both lung bases likely reflecting layering small pleural effusions with associated bibasilar atelectasis. No pneumothorax. No acute osseous abnormality. IMPRESSION: 1. Mild congestive heart failure. Electronically Signed   By: Elsie ONEIDA Shoulder M.D.   On: 08/01/2023 12:27    Cardiac Studies   Echo 08/02/23:  1. No apical thrombus with Definity . Left ventricular ejection fraction,  by estimation, is 35 to 40%. Left ventricular ejection fraction by 2D MOD  biplane is 40.8 %. The left ventricle has moderately decreased function.  The left ventricle demonstrates  regional wall motion abnormalities (see scoring diagram/findings for  description). The left ventricular internal cavity size was mildly  dilated. Left ventricular diastolic parameters are consistent with Grade  III diastolic dysfunction (restrictive).  Elevated left ventricular end-diastolic pressure. There is abnormal  (paradoxical) septal motion, consistent with right ventricular volume  overload. There is  severe akinesis of the left ventricular, mid inferior  wall and inferolateral wall.   2. Right ventricular systolic function is low normal. The right  ventricular size is normal. There is severely elevated pulmonary artery  systolic pressure. The estimated right ventricular systolic pressure is  69.5 mmHg.   3. Left atrial size was moderately dilated.   4. Right atrial size was severely dilated.   5. The mitral valve is abnormal. Mild mitral valve regurgitation.   6. The tricuspid valve is abnormal. Tricuspid valve regurgitation is  moderate.   7. The aortic valve is tricuspid. Aortic valve regurgitation is not  visualized. No aortic stenosis is present.   8. The inferior vena cava is dilated in size with <50% respiratory  variability, suggesting right atrial pressure of 15 mmHg.   Patient Profile     48 y.o. 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, osteomyelitis 2014, who is being seen for the evaluation of CHF   Assessment & Plan    Acute on chronic systolic and diastolic heart failure with RV failure Pulmonary hypertension Elevated troponin History of mitral valve endocarditis 2022 -Echocardiogram this admission with an LVEF 35-40%, RWMA with severe akinesis of the LV, mid inferior wall and inferolateral wall, grade 3 DD, elevated LVEDP with RV overload, PASP 69.5 mmHg, mild LAE, severe RAE, mild MR, moderate TR, no aortic stenosis -Cardiology was consulted for help with diuresis-Lasix  increased to 100 mg twice daily with metolazone  x 1 - diuresis of 1L urine output yesterday, no weights recorded - she remains significantly volume up - need bed weights at least, strict I&Os -  difficult to know if we need to increase to 120 mg lasix  BID vs lasix  gtt - given renal function, I think increasing to 120 mg IV lasix  would be beneficial - I think RHC will be helpful to guide diuresis, timing will need to be discussed  OHS/OSA - Likely contributing to RV failure -- she has not been screened for sleep apnea   AKI on CKD stage III - sCr 2.18 (2.43) - K 4.1   Super morbid obesity Hx of DVT/PE Chronic anticoagulation DM2 Hx of ischemic stroke 02/2023 - per primary    For questions or updates, please contact [facility] Please consult [email or web address] for contact info under        Signed, Jon Nat Hails, PA  08/03/2023, 9:36 AM

## 2023-08-03 NOTE — Progress Notes (Signed)
 PROGRESS NOTE  Karen Bennett FMW:[id number] DOB: July 20, 1976   PCP: Suanne Pfeiffer, NP  Patient is from: Home.  Uses wheelchair at baseline.  DOA: 08/01/2023 LOS: 2  Chief complaints Chief Complaint  Patient presents with   Leg Swelling     Brief Narrative / Interim history: 48 year old female with anxiety, HTN, hypothyroidism, DM-2, CKD-3B, morbid obesity, PE on Eliquis , history of Candida albicans fungemia, MV endocarditis in 12/2020 and right BKA presented to ED with worsening edema, abdominal bloating, orthopnea and PND.  Per patient, she had 2 weeks of abdominal bloating, BLE swelling.  Initially started on Lasix  without significant improvement.  She was transitioned to p.o. torsemide .  However, continued to have worsening symptoms despite compliance. In the last 2 weeks, she has dyspnea with exertion, at baseline wheelchair-bound and gets short of breath with transfers.    BNP is elevated to 423.  CXR consistent with CHF.  Patient was started on IV Lasix .  TTE with LVEF of 35 to 40%, RWMA, RVSP of 69.5, moderate LAE, severe RAE and moderate TR. Started on IV Lasix .  Cardiology consulted on diuresis with IV Lasix .  Subjective: Seen and examined earlier this morning.  No major events overnight of this morning.  Continues to feel pressure and soreness in her legs.  Denies shortness of breath.  Did not appreciate significant improvement in edema.  I&O incomplete.  About 1 L of UOP overnight.   Objective: Vitals:   08/03/23 0523 08/03/23 1000 08/03/23 1233 08/03/23 1300  BP: 129/83  124/62   Pulse: 84  77   Resp: 18  20   Temp: 97.9 F (36.6 C)  97.9 F (36.6 C)   TempSrc: Oral  Oral   SpO2: 95%  94%   Weight:    (!) 217.4 kg  Height:  5' 6 (1.676 m)      Examination:  GENERAL: No apparent distress.  Nontoxic. HEENT: MMM.  Vision and hearing grossly intact.  NECK: Supple.  Difficult to assess JVD due to body habitus. RESP:  No IWOB.  Fair aeration bilaterally but  limited exam due to body habitus. CVS:  RRR. Heart sounds normal.  ABD/GI/GU: BS+. Abd soft, NTND.  MSK/EXT: Right BKA.  Significant lymphedema bilaterally. SKIN: no apparent skin lesion or wound NEURO: Awake, alert and oriented appropriately.  No apparent focal neuro deficit. PSYCH: Calm. Normal affect.   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  BNP elevated to 523.  CXR with mild CHF.  TTE with LVEF of 35 to 40% (45% in 02/2023), RWMA, RVSP of 69.5, moderate LAE, severe RAE and moderate TR.  Difficult to assess fluid status due to body habitus.  INO incomplete.  Net -1.3 L so far.  Creatinine improving.  Blood pressure stable -Appreciate guidance by cardiology-increased Lasix  to 120 mg and added metolazone  -Strict intake and output, daily weight, renal functions and electrolytes -Check TSH  AKI on CKD-3B: AKI improving. Recent Labs    02/21/23 0603 02/22/23 0533 02/23/23 0530 03/21/23 1749 03/21/23 1750 03/22/23 0416 08/01/23 1140 08/01/23 1501 08/02/23 0456 08/03/23 0536  BUN 32* 31* 29* 15 13 14  67* 56* 66* 68*  CREATININE 1.99* 1.96* 1.98* 1.30* 1.33* 1.71* 2.06* 2.50* 2.43* 2.18*  -Continue monitoring  IDDM-2 with hyperglycemia and HLD: A1c 7.2% on 1/8. Recent Labs  Lab 08/02/23 1206 08/02/23 1624 08/02/23 2204 08/03/23 0734 08/03/23 1155  GLUCAP 148* 121* 104* 125*  159*  -Continue current insulin  regimen -Continue home Lipitor   Hypothyroidism -Check TSH -Continue Synthroid    History of PE, in July 2024 -Continue eliquis    Hx of right BKA (HCC), chronic pain in the right hip -Noted, continue oxycodone  as needed   morbid obesity- Body mass index is 77.36 kg/m.          DVT prophylaxis:  SCDs Start: 08/01/23 1400 apixaban  (ELIQUIS ) tablet 5 mg  Code Status: Full code Family Communication: None advised  Level of care:  Telemetry Status is: Inpatient Remains inpatient appropriate because: Acute on chronic CHF   Final disposition: Likely home Consultants:  Cardiology  55 minutes with more than 50% spent in reviewing records, counseling patient/family and coordinating care.   Sch Meds:  Scheduled Meds:  apixaban   5 mg Oral BID   atorvastatin   40 mg Oral q1800   Gerhardt's butt cream   Topical TID   insulin  aspart  0-15 Units Subcutaneous TID WC   insulin  aspart  0-5 Units Subcutaneous QHS   levothyroxine   75 mcg Oral Q0600   metolazone   5 mg Oral Once   Continuous Infusions:  furosemide      PRN Meds:.acetaminophen  **OR** acetaminophen , albuterol , mouth rinse, oxyCODONE , traZODone   Antimicrobials: Anti-infectives (From admission, onward)    None        I have personally reviewed the following labs and images: CBC: Recent Labs  Lab 08/01/23 1140 08/01/23 1501 08/02/23 0456 08/03/23 0536  WBC 6.3  --  5.0 5.1  NEUTROABS 4.9  --   --   --   HGB 12.3 12.9 11.4* 11.7*  HCT 39.1 38.0 37.4 38.9  MCV 90.5  --  91.4 91.7  PLT 227  --  221 234   BMP &GFR Recent Labs  Lab 08/01/23 1140 08/01/23 1501 08/02/23 0456 08/03/23 0536  NA 132* 137 131* 132*  K 5.1 4.3 4.2 4.1  CL 104 102 100 102  CO2 21*  --  22 23  GLUCOSE 151* 132* 129* 116*  BUN 67* 56* 66* 68*  CREATININE 2.06* 2.50* 2.43* 2.18*  CALCIUM  8.5*  --  8.3* 8.5*   Estimated Creatinine Clearance: 62.4 mL/min (A) (by C-G formula based on SCr of 2.18 mg/dL (H)). Liver & Pancreas: Recent Labs  Lab 08/01/23 1140  AST 18  ALT 11  ALKPHOS 63  BILITOT 1.2  PROT 7.3  ALBUMIN  2.8*   No results for input(s): LIPASE, AMYLASE in the last 168 hours. No results for input(s): AMMONIA in the last 168 hours. Diabetic: Recent Labs    08/01/23 1850  HGBA1C 7.2*   Recent Labs  Lab 08/02/23 1206 08/02/23 1624 08/02/23 2204 08/03/23 0734 08/03/23 1155  GLUCAP 148* 121* 104* 125* 159*   Cardiac Enzymes: No  results for input(s): CKTOTAL, CKMB, CKMBINDEX, TROPONINI in the last 168 hours. No results for input(s): PROBNP in the last 8760 hours. Coagulation Profile: No results for input(s): INR, PROTIME in the last 168 hours. Thyroid  Function Tests: No results for input(s): TSH, T4TOTAL, FREET4, T3FREE, THYROIDAB in the last 72 hours. Lipid Profile: No results for input(s): CHOL, HDL, LDLCALC, TRIG, CHOLHDL, LDLDIRECT in the last 72 hours. Anemia Panel: No results for input(s): VITAMINB12, FOLATE, FERRITIN, TIBC, IRON , RETICCTPCT in the last 72 hours. Urine analysis:    Component Value Date/Time   COLORURINE AMBER (A) 03/22/2023 0426   APPEARANCEUR TURBID (A) 03/22/2023 0426   LABSPEC 1.040 (H) 03/22/2023 0426   PHURINE 5.0 03/22/2023 0426   GLUCOSEU >=500 (A)  03/22/2023 0426   HGBUR LARGE (A) 03/22/2023 0426   BILIRUBINUR NEGATIVE 03/22/2023 0426   KETONESUR NEGATIVE 03/22/2023 0426   PROTEINUR >=300 (A) 03/22/2023 0426   UROBILINOGEN 0.2 09/05/2011 0558   NITRITE POSITIVE (A) 03/22/2023 0426   LEUKOCYTESUR MODERATE (A) 03/22/2023 0426   Sepsis Labs: Invalid input(s): PROCALCITONIN, LACTICIDVEN  Microbiology: No results found for this or any previous visit (from the past 240 hours).  Radiology Studies: No results found.    Shamiracle Gorden T. Tilmon Wisehart [facility]  If 7PM-7AM, please contact night-coverage [email or web address] 08/03/2023, 3:30 PM

## 2023-08-03 NOTE — Plan of Care (Signed)
   Problem: Education: Goal: Ability to describe self-care measures that may prevent or decrease complications (Diabetes Survival Skills Education) will improve Outcome: Progressing   Problem: Coping: Goal: Ability to adjust to condition or change in health will improve Outcome: Progressing   Problem: Fluid Volume: Goal: Ability to maintain a balanced intake and output will improve Outcome: Progressing

## 2023-08-04 DIAGNOSIS — Z86711 Personal history of pulmonary embolism: Secondary | ICD-10-CM

## 2023-08-04 DIAGNOSIS — N179 Acute kidney failure, unspecified: Secondary | ICD-10-CM | POA: Diagnosis not present

## 2023-08-04 DIAGNOSIS — E039 Hypothyroidism, unspecified: Secondary | ICD-10-CM

## 2023-08-04 DIAGNOSIS — I5033 Acute on chronic diastolic (congestive) heart failure: Secondary | ICD-10-CM | POA: Diagnosis not present

## 2023-08-04 DIAGNOSIS — Z89511 Acquired absence of right leg below knee: Secondary | ICD-10-CM

## 2023-08-04 LAB — CBC
HCT: 38.1 % (ref 36.0–46.0)
Hemoglobin: 11.5 g/dL — ABNORMAL LOW (ref 12.0–15.0)
MCH: 27.8 pg (ref 26.0–34.0)
MCHC: 30.2 g/dL (ref 30.0–36.0)
MCV: 92 fL (ref 80.0–100.0)
Platelets: 203 10*3/uL (ref 150–400)
RBC: 4.14 MIL/uL (ref 3.87–5.11)
RDW: 13.4 % (ref 11.5–15.5)
WBC: 5.6 10*3/uL (ref 4.0–10.5)
nRBC: 0 % (ref 0.0–0.2)

## 2023-08-04 LAB — BASIC METABOLIC PANEL
Anion gap: 9 (ref 5–15)
BUN: 68 mg/dL — ABNORMAL HIGH (ref 6–20)
CO2: 22 mmol/L (ref 22–32)
Calcium: 8.5 mg/dL — ABNORMAL LOW (ref 8.9–10.3)
Chloride: 102 mmol/L (ref 98–111)
Creatinine, Ser: 2.21 mg/dL — ABNORMAL HIGH (ref 0.44–1.00)
GFR, Estimated: 27 mL/min — ABNORMAL LOW (ref >60)
Glucose, Bld: 130 mg/dL — ABNORMAL HIGH (ref 70–99)
Potassium: 3.7 mmol/L (ref 3.5–5.1)
Sodium: 133 mmol/L — ABNORMAL LOW (ref 135–145)

## 2023-08-04 LAB — GLUCOSE, CAPILLARY
Glucose-Capillary: 129 mg/dL — ABNORMAL HIGH (ref 70–99)
Glucose-Capillary: 145 mg/dL — ABNORMAL HIGH (ref 70–99)
Glucose-Capillary: 149 mg/dL — ABNORMAL HIGH (ref 70–99)
Glucose-Capillary: 175 mg/dL — ABNORMAL HIGH (ref 70–99)

## 2023-08-04 LAB — MAGNESIUM: Magnesium: 2.3 mg/dL (ref 1.7–2.4)

## 2023-08-04 LAB — TSH: TSH: 4.613 u[IU]/mL — ABNORMAL HIGH (ref 0.350–4.500)

## 2023-08-04 NOTE — Progress Notes (Signed)
 PT Cancellation Note  Patient Details Name: Karen Bennett MRN: [id number] DOB: March 28, 1976   Cancelled Treatment:    Reason Eval/Treat Not Completed: Patient declined PT today stating she could not move and that leg was still too swollen.  RN also attempted to get her to work with PT with same result.  Will check back as schedule permits.  Darice LITTIE Sharps 08/04/2023, 10:53 AM

## 2023-08-04 NOTE — Plan of Care (Signed)
  Problem: Nutrition: Goal: Adequate nutrition will be maintained Outcome: Progressing   Problem: Coping: Goal: Level of anxiety will decrease Outcome: Progressing   Problem: Pain Management: Goal: General experience of comfort will improve Outcome: Progressing   Problem: Safety: Goal: Ability to remain free from injury will improve Outcome: Progressing   Problem: Skin Integrity: Goal: Risk for impaired skin integrity will decrease Outcome: Progressing

## 2023-08-04 NOTE — Progress Notes (Signed)
 OT Cancellation Note  Patient Details Name: Karen Bennett MRN: [id number] DOB: 02-Mar-1976   Cancelled Treatment:    Reason Eval/Treat Not Completed: Other (comment). Pt declined OT today, reports limitation d/t current LE swelling. Plan to reattempt at a later date.  Lamarr Hoots, OT Acute Rehabilitation Services Office: [phone]   Hoots Lamarr DEL 08/04/2023, 12:44 PM

## 2023-08-04 NOTE — Progress Notes (Signed)
 Rounding Note    Patient Name: Karen Bennett Date of Encounter: 08/04/2023  [facility] Cardiologist: Annabella Scarce, MD   Subjective   Pt still with swelling that is bothersome   Breathing is fair  NO CP   Inpatient Medications    Scheduled Meds:  apixaban   5 mg Oral BID   atorvastatin   40 mg Oral q1800   Gerhardt's butt cream   Topical TID   insulin  aspart  0-15 Units Subcutaneous TID WC   insulin  aspart  0-5 Units Subcutaneous QHS   levothyroxine   75 mcg Oral Q0600   Continuous Infusions:  furosemide  120 mg (08/03/23 1816)   PRN Meds: acetaminophen  **OR** acetaminophen , albuterol , mouth rinse, oxyCODONE , traZODone    Vital Signs    Vitals:   08/03/23 1233 08/03/23 1300 08/03/23 2122 08/04/23 0509  BP: 124/62  126/75 119/66  Pulse: 77  77 76  Resp: 20  18 18   Temp: 97.9 F (36.6 C)  97.9 F (36.6 C) 98.1 F (36.7 C)  TempSrc: Oral  Oral Oral  SpO2: 94%  95% 98%  Weight:  (!) 217.4 kg    Height:        Intake/Output Summary (Last 24 hours) at 08/04/2023 0648 Last data filed at 08/04/2023 0500 Gross per 24 hour  Intake 1028.12 ml  Output 1400 ml  Net -371.88 ml      08/03/2023    1:00 PM 03/21/2023    6:33 PM 03/21/2023    5:00 PM  Last 3 Weights  Weight (lbs) 479 lb 4.5 oz 361 lb 8.9 oz 361 lb 8.9 oz  Weight (kg) 217.4 kg 164 kg 164 kg      Telemetry    SR 80s to 90s - Personally Reviewed  ECG    No new tracings - Personally Reviewed  Physical Exam   GEN: super morbid obesity, significant LE edema, right BKA  Neck:Neck is full  Cardiac: RRR, no murmurs Respiratory:Relatively clear  GI  Obese  MS: B LE edema   S/P right BKA   Labs    High Sensitivity Troponin:   Recent Labs  Lab 08/01/23 1140 08/01/23 1414  TROPONINIHS 33* 36*     Chemistry Recent Labs  Lab 08/01/23 1140 08/01/23 1501 08/02/23 0456 08/03/23 0536  NA 132* 137 131* 132*  K 5.1 4.3 4.2 4.1  CL 104 102 100 102  CO2 21*  --  22 23  GLUCOSE 151*  132* 129* 116*  BUN 67* 56* 66* 68*  CREATININE 2.06* 2.50* 2.43* 2.18*  CALCIUM  8.5*  --  8.3* 8.5*  PROT 7.3  --   --   --   ALBUMIN  2.8*  --   --   --   AST 18  --   --   --   ALT 11  --   --   --   ALKPHOS 63  --   --   --   BILITOT 1.2  --   --   --   GFRNONAA 30*  --  24* 28*  ANIONGAP 7  --  9 7    Lipids No results for input(s): CHOL, TRIG, HDL, LABVLDL, LDLCALC, CHOLHDL in the last 168 hours.  Hematology Recent Labs  Lab 08/01/23 1140 08/01/23 1501 08/02/23 0456 08/03/23 0536  WBC 6.3  --  5.0 5.1  RBC 4.32  --  4.09 4.24  HGB 12.3 12.9 11.4* 11.7*  HCT 39.1 38.0 37.4 38.9  MCV 90.5  --  91.4 91.7  MCH 28.5  --  27.9 27.6  MCHC 31.5  --  30.5 30.1  RDW 13.5  --  13.4 13.4  PLT 227  --  221 234   Thyroid  No results for input(s): TSH, FREET4 in the last 168 hours.  BNP Recent Labs  Lab 08/01/23 1140  BNP 523.3*    DDimer No results for input(s): DDIMER in the last 168 hours.   Radiology    ECHOCARDIOGRAM COMPLETE Result Date: 08/02/2023    ECHOCARDIOGRAM REPORT   Patient Name:   Karen Bennett Date of Exam: 08/02/2023 Medical Rec #:  [id number]       Height:       66.0 in Accession #:    [id number]      Weight:       361.6 lb Date of Birth:  03/27/1976      BSA:          2.572 m Patient Age:    47 years        BP:           117/70 mmHg Patient Gender: F               HR:           75 bpm. Exam Location:  Inpatient Procedure: 2D Echo, Cardiac Doppler, Color Doppler and Intracardiac            Opacification Agent Indications:    CHF acute- diastolic  History:        Patient has prior history of Echocardiogram examinations, most                 recent 03/23/2023. Risk Factors:Diabetes and Current Smoker.  Sonographer:    Ozell Free Referring Phys: [id number] RIPUDEEP K RAI  Sonographer Comments: Technically challenging study due to limited acoustic windows and patient is obese. IMPRESSIONS  1. No apical thrombus with Definity . Left ventricular ejection  fraction, by estimation, is 35 to 40%. Left ventricular ejection fraction by 2D MOD biplane is 40.8 %. The left ventricle has moderately decreased function. The left ventricle demonstrates regional wall motion abnormalities (see scoring diagram/findings for description). The left ventricular internal cavity size was mildly dilated. Left ventricular diastolic parameters are consistent with Grade III diastolic dysfunction (restrictive). Elevated left ventricular end-diastolic pressure. There is abnormal (paradoxical) septal motion, consistent with right ventricular volume overload. There is severe akinesis of the left ventricular, mid inferior wall and inferolateral wall.  2. Right ventricular systolic function is low normal. The right ventricular size is normal. There is severely elevated pulmonary artery systolic pressure. The estimated right ventricular systolic pressure is 69.5 mmHg.  3. Left atrial size was moderately dilated.  4. Right atrial size was severely dilated.  5. The mitral valve is abnormal. Mild mitral valve regurgitation.  6. The tricuspid valve is abnormal. Tricuspid valve regurgitation is moderate.  7. The aortic valve is tricuspid. Aortic valve regurgitation is not visualized. No aortic stenosis is present.  8. The inferior vena cava is dilated in size with <50% respiratory variability, suggesting right atrial pressure of 15 mmHg. Comparison(s): Changes from prior study are noted. 03/23/2023: LVEF 45% with apical hypokinesis. Conclusion(s)/Recommendation(s): Compared to the prior study, there appear to be new regional WMA's - suggestive of LCX territory ischemia/infarct. FINDINGS  Left Ventricle: No apical thrombus with Definity . Left ventricular ejection fraction, by estimation, is 35 to 40%. Left ventricular ejection fraction by 2D MOD biplane is 40.8 %. The left ventricle has  moderately decreased function. The left ventricle demonstrates regional wall motion abnormalities. Severe akinesis of  the left ventricular, mid inferior wall and inferolateral wall. Definity  contrast agent was given IV to delineate the left ventricular endocardial borders. The left ventricular internal cavity size was mildly dilated. There is no left ventricular hypertrophy. Abnormal (paradoxical) septal motion, consistent with right ventricular volume overload. Left ventricular diastolic parameters are consistent with Grade III diastolic dysfunction (restrictive). Elevated left ventricular end-diastolic pressure.  LV Wall Scoring: The antero-lateral wall and posterior wall are akinetic. Right Ventricle: The right ventricular size is normal. No increase in right ventricular wall thickness. Right ventricular systolic function is low normal. There is severely elevated pulmonary artery systolic pressure. The tricuspid regurgitant velocity is 3.69 m/s, and with an assumed right atrial pressure of 15 mmHg, the estimated right ventricular systolic pressure is 69.5 mmHg. Left Atrium: Left atrial size was moderately dilated. Right Atrium: Right atrial size was severely dilated. Pericardium: There is no evidence of pericardial effusion. Mitral Valve: The mitral valve is abnormal. There is mild calcification of the anterior and posterior mitral valve leaflet(s). Mild mitral valve regurgitation. MV peak gradient, 7.6 mmHg. The mean mitral valve gradient is 3.0 mmHg. Tricuspid Valve: The tricuspid valve is abnormal. Tricuspid valve regurgitation is moderate. Aortic Valve: The aortic valve is tricuspid. Aortic valve regurgitation is not visualized. No aortic stenosis is present. Aortic valve mean gradient measures 4.0 mmHg. Aortic valve peak gradient measures 7.4 mmHg. Aortic valve area, by VTI measures 1.70 cm. Pulmonic Valve: The pulmonic valve was grossly normal. Pulmonic valve regurgitation is trivial. Aorta: The aortic root and ascending aorta are structurally normal, with no evidence of dilitation. Venous: The inferior vena cava is  dilated in size with less than 50% respiratory variability, suggesting right atrial pressure of 15 mmHg. IAS/Shunts: No atrial level shunt detected by color flow Doppler.  LEFT VENTRICLE PLAX 2D                        Biplane EF (MOD) LVIDd:         5.70 cm         LV Biplane EF:   Left LVIDs:         4.50 cm                          ventricular LV PW:         0.90 cm                          ejection LV IVS:        1.00 cm                          fraction by LVOT diam:     2.00 cm                          2D MOD LV SV:         41                               biplane is LV SV Index:   16  40.8 %. LVOT Area:     3.14 cm                                Diastology                                LV e' medial:    4.57 cm/s LV Volumes (MOD)               LV E/e' medial:  26.7 LV vol d, MOD    233.0 ml      LV e' lateral:   7.18 cm/s A2C:                           LV E/e' lateral: 17.0 LV vol d, MOD    219.0 ml A4C: LV vol s, MOD    139.0 ml A2C: LV vol s, MOD    119.0 ml A4C: LV SV MOD A2C:   94.0 ml LV SV MOD A4C:   219.0 ml LV SV MOD BP:    93.7 ml RIGHT VENTRICLE             IVC RV S prime:     10.30 cm/s  IVC diam: 2.60 cm TAPSE (M-mode): 2.2 cm LEFT ATRIUM              Index        RIGHT ATRIUM           Index LA diam:        4.70 cm  1.83 cm/m   RA Area:     39.90 cm LA Vol (A2C):   95.4 ml  37.09 ml/m  RA Volume:   170.00 ml 66.10 ml/m LA Vol (A4C):   99.0 ml  38.49 ml/m LA Biplane Vol: 106.0 ml 41.21 ml/m  AORTIC VALVE AV Area (Vmax):    1.72 cm AV Area (Vmean):   1.68 cm AV Area (VTI):     1.70 cm AV Vmax:           136.00 cm/s AV Vmean:          90.700 cm/s AV VTI:            0.244 m AV Peak Grad:      7.4 mmHg AV Mean Grad:      4.0 mmHg LVOT Vmax:         74.40 cm/s LVOT Vmean:        48.500 cm/s LVOT VTI:          0.132 m LVOT/AV VTI ratio: 0.54  AORTA Ao Root diam: 3.00 cm Ao Asc diam:  2.70 cm MITRAL VALVE                TRICUSPID VALVE MV Area (PHT): 3.08 cm     TR  Peak grad:   54.5 mmHg MV Area VTI:   1.26 cm     TR Vmax:        369.00 cm/s MV Peak grad:  7.6 mmHg MV Mean grad:  3.0 mmHg     SHUNTS MV Vmax:       1.38 m/s     Systemic VTI:  0.13 m MV Vmean:      80.5 cm/s    Systemic Diam: 2.00 cm MV Decel Time: 246 msec MR Peak grad: 67.6  mmHg MR Vmax:      411.00 cm/s MV E velocity: 122.00 cm/s MV A velocity: 69.80 cm/s MV E/A ratio:  1.75 Vinie Maxcy MD Electronically signed by Vinie Maxcy MD Signature Date/Time: 08/02/2023/4:23:45 PM    Final     Cardiac Studies   Echo 08/02/23:  1. No apical thrombus with Definity . Left ventricular ejection fraction,  by estimation, is 35 to 40%. Left ventricular ejection fraction by 2D MOD  biplane is 40.8 %. The left ventricle has moderately decreased function.  The left ventricle demonstrates  regional wall motion abnormalities (see scoring diagram/findings for  description). The left ventricular internal cavity size was mildly  dilated. Left ventricular diastolic parameters are consistent with Grade  III diastolic dysfunction (restrictive).  Elevated left ventricular end-diastolic pressure. There is abnormal  (paradoxical) septal motion, consistent with right ventricular volume  overload. There is severe akinesis of the left ventricular, mid inferior  wall and inferolateral wall.   2. Right ventricular systolic function is low normal. The right  ventricular size is normal. There is severely elevated pulmonary artery  systolic pressure. The estimated right ventricular systolic pressure is  69.5 mmHg.   3. Left atrial size was moderately dilated.   4. Right atrial size was severely dilated.   5. The mitral valve is abnormal. Mild mitral valve regurgitation.   6. The tricuspid valve is abnormal. Tricuspid valve regurgitation is  moderate.   7. The aortic valve is tricuspid. Aortic valve regurgitation is not  visualized. No aortic stenosis is present.   8. The inferior vena cava is dilated in size with <50%  respiratory  variability, suggesting right atrial pressure of 15 mmHg.   Patient Profile     48 y.o. 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, osteomyelitis 2014, who is being seen for the evaluation of CHF   Assessment & Plan    HFrEF     -Echocardiogram this admission with an LVEF 35-40%, RWMA with  akinesis of the LV, mid inferior wall and inferolateral wall, grade 3 DD, elevated LVEDP/ PASP 69.5 mmHg, mild LAE, severe RAE, mild MR, moderate TR, -Cardiology was consulted for help with diuresis-Lasix  increased to 100 mg twice daily with metolazone  x 1 She remains volume overloaded  I would continue on 120 lasix  IV bid    Continue to follow I/O     OHS/OSA -- she has not been screened for sleep apnea   AKI on CKD stage III - sCr 2.21 today     - K 4.1  Hx of DVT/PE Chronic anticoagulation    For questions or updates, please contact [facility] Please consult [email or web address] for contact info under        Signed, Vina Gull, MD  08/04/2023, 6:48 AM

## 2023-08-04 NOTE — Progress Notes (Signed)
 PROGRESS NOTE  Karen Bennett FMW:[id number] DOB: Dec 12, 1975   PCP: Suanne Pfeiffer, NP  Patient is from: Home.  Uses wheelchair at baseline.  DOA: 08/01/2023 LOS: 3  Chief complaints Chief Complaint  Patient presents with   Leg Swelling     Brief Narrative / Interim history: 48 year old female with anxiety, HTN, hypothyroidism, DM-2, CKD-3B, morbid obesity, PE on Eliquis , history of Candida albicans fungemia, MV endocarditis in 12/2020 and right BKA presented to ED with worsening edema, abdominal bloating, orthopnea and PND.  Per patient, she had 2 weeks of abdominal bloating, BLE swelling.  Initially started on Lasix  without significant improvement.  She was transitioned to p.o. torsemide .  However, continued to have worsening symptoms despite compliance. In the last 2 weeks, she has dyspnea with exertion, at baseline wheelchair-bound and gets short of breath with transfers.    BNP is elevated to 423.  CXR consistent with CHF.  Patient was started on IV Lasix .  TTE with LVEF of 35 to 40%, RWMA, RVSP of 69.5, moderate LAE, severe RAE and moderate TR. Started on IV Lasix .  Cardiology consulted on diuresis with IV Lasix .  Subjective: Seen and examined earlier this morning.  No major events overnight of this morning.  Continues to endorse pressure and soreness in her legs.  No chest pain or shortness of breath.  Had about 1.4 L urine output in the last 24 hours.  Net -1.6 L.  Blood pressure and renal function stable.  Objective: Vitals:   08/03/23 1300 08/03/23 2122 08/04/23 0509 08/04/23 0900  BP:  126/75 119/66 104/86  Pulse:  77 76 77  Resp:  18 18 20   Temp:  97.9 F (36.6 C) 98.1 F (36.7 C) 97.7 F (36.5 C)  TempSrc:  Oral Oral Oral  SpO2:  95% 98% 96%  Weight: (!) 217.4 kg     Height:        Examination:  GENERAL: No apparent distress.  Nontoxic. HEENT: MMM.  Vision and hearing grossly intact.  NECK: Supple.  Difficult to assess JVD due to body habitus. RESP:  No  IWOB.  Fair aeration bilaterally but limited exam due to body habitus. CVS:  RRR. Heart sounds normal.  ABD/GI/GU: BS+. Abd soft, NTND.  MSK/EXT: Right BKA.  Significant lymphedema bilaterally. SKIN: no apparent skin lesion or wound NEURO: Awake, alert and oriented appropriately.  No apparent focal neuro deficit. PSYCH: Calm. Normal affect.   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  BNP elevated to 523.  CXR with mild CHF.  TTE with LVEF of 35 to 40% (45% in 02/2023), RWMA, RVSP of 69.5, moderate LAE, severe RAE and moderate TR.  Difficult to assess fluid status due to body habitus.  INO incomplete.  Net -1.6 L so far.  BP and Cr stable. -Appreciate guidance by cardiology-on IV Lasix  120 mg twice daily with intermittent metolazone  -Strict intake and output, daily weight, renal functions and electrolytes  AKI on CKD-3B: AKI improving. Recent Labs    02/22/23 0533 02/23/23 0530 03/21/23 1749 03/21/23 1750 03/22/23 0416 08/01/23 1140 08/01/23 1501 08/02/23 0456 08/03/23 0536 08/04/23 0745  BUN 31* 29* 15 13 14  67* 56* 66* 68* 68*  CREATININE 1.96* 1.98* 1.30* 1.33* 1.71* 2.06* 2.50* 2.43* 2.18* 2.21*  -Continue monitoring  IDDM-2 with hyperglycemia and HLD: A1c 7.2% on 1/8. Recent Labs  Lab 08/03/23 1155 08/03/23 1650 08/03/23 2120 08/04/23 0736 08/04/23 1155  GLUCAP 159* 151* 123* 129* 145*  -Continue current insulin  regimen -Continue home Lipitor   Hypothyroidism: TSH 4.6. -Continue Synthroid    History of PE, in July 2024 -Continue eliquis    Hx of right BKA (HCC), chronic pain in the right hip: Able to transfer from bed to wheelchair at baseline -Noted, continue oxycodone  as needed -PT/OT   morbid obesity- Body mass index is 77.36 kg/m.          DVT prophylaxis:  SCDs Start: 08/01/23 1400 apixaban  (ELIQUIS ) tablet 5 mg  Code Status:  Full code Family Communication: None advised  Level of care: Telemetry Status is: Inpatient Remains inpatient appropriate because: Acute on chronic CHF   Final disposition: Likely home Consultants:  Cardiology  35 minutes with more than 50% spent in reviewing records, counseling patient/family and coordinating care.   Sch Meds:  Scheduled Meds:  apixaban   5 mg Oral BID   atorvastatin   40 mg Oral q1800   Gerhardt's butt cream   Topical TID   insulin  aspart  0-15 Units Subcutaneous TID WC   insulin  aspart  0-5 Units Subcutaneous QHS   levothyroxine   75 mcg Oral Q0600   Continuous Infusions:  furosemide  120 mg (08/04/23 0935)   PRN Meds:.acetaminophen  **OR** acetaminophen , albuterol , mouth rinse, oxyCODONE , traZODone   Antimicrobials: Anti-infectives (From admission, onward)    None        I have personally reviewed the following labs and images: CBC: Recent Labs  Lab 08/01/23 1140 08/01/23 1501 08/02/23 0456 08/03/23 0536 08/04/23 0745  WBC 6.3  --  5.0 5.1 5.6  NEUTROABS 4.9  --   --   --   --   HGB 12.3 12.9 11.4* 11.7* 11.5*  HCT 39.1 38.0 37.4 38.9 38.1  MCV 90.5  --  91.4 91.7 92.0  PLT 227  --  221 234 203   BMP &GFR Recent Labs  Lab 08/01/23 1140 08/01/23 1501 08/02/23 0456 08/03/23 0536 08/04/23 0745  NA 132* 137 131* 132* 133*  K 5.1 4.3 4.2 4.1 3.7  CL 104 102 100 102 102  CO2 21*  --  22 23 22   GLUCOSE 151* 132* 129* 116* 130*  BUN 67* 56* 66* 68* 68*  CREATININE 2.06* 2.50* 2.43* 2.18* 2.21*  CALCIUM  8.5*  --  8.3* 8.5* 8.5*  MG  --   --   --   --  2.3   Estimated Creatinine Clearance: 61.5 mL/min (A) (by C-G formula based on SCr of 2.21 mg/dL (H)). Liver & Pancreas: Recent Labs  Lab 08/01/23 1140  AST 18  ALT 11  ALKPHOS 63  BILITOT 1.2  PROT 7.3  ALBUMIN  2.8*   No results for input(s): LIPASE, AMYLASE in the last 168 hours. No results for input(s): AMMONIA in the last 168 hours. Diabetic: Recent Labs     08/01/23 1850  HGBA1C 7.2*   Recent Labs  Lab 08/03/23 1155 08/03/23 1650 08/03/23 2120 08/04/23 0736 08/04/23 1155  GLUCAP 159* 151* 123* 129* 145*   Cardiac Enzymes: No results for input(s): CKTOTAL, CKMB, CKMBINDEX, TROPONINI in the last 168 hours. No results for input(s): PROBNP in the last 8760 hours. Coagulation Profile: No results for input(s): INR, PROTIME in the last 168 hours. Thyroid  Function Tests: Recent Labs    08/04/23 0745  TSH 4.613*   Lipid Profile: No results for input(s): CHOL, HDL, LDLCALC, TRIG, CHOLHDL, LDLDIRECT in the last 72 hours. Anemia Panel: No results for input(s): VITAMINB12, FOLATE, FERRITIN, TIBC, IRON , RETICCTPCT in the last  72 hours. Urine analysis:    Component Value Date/Time   COLORURINE AMBER (A) 03/22/2023 0426   APPEARANCEUR TURBID (A) 03/22/2023 0426   LABSPEC 1.040 (H) 03/22/2023 0426   PHURINE 5.0 03/22/2023 0426   GLUCOSEU >=500 (A) 03/22/2023 0426   HGBUR LARGE (A) 03/22/2023 0426   BILIRUBINUR NEGATIVE 03/22/2023 0426   KETONESUR NEGATIVE 03/22/2023 0426   PROTEINUR >=300 (A) 03/22/2023 0426   UROBILINOGEN 0.2 09/05/2011 0558   NITRITE POSITIVE (A) 03/22/2023 0426   LEUKOCYTESUR MODERATE (A) 03/22/2023 0426   Sepsis Labs: Invalid input(s): PROCALCITONIN, LACTICIDVEN  Microbiology: No results found for this or any previous visit (from the past 240 hours).  Radiology Studies: No results found.    Edel Rivero T. Odalys Win [facility]  If 7PM-7AM, please contact night-coverage [email or web address] 08/04/2023, 12:25 PM

## 2023-08-04 NOTE — Plan of Care (Signed)
Cont with plan of care

## 2023-08-05 DIAGNOSIS — I5033 Acute on chronic diastolic (congestive) heart failure: Secondary | ICD-10-CM | POA: Diagnosis not present

## 2023-08-05 DIAGNOSIS — Z86711 Personal history of pulmonary embolism: Secondary | ICD-10-CM | POA: Diagnosis not present

## 2023-08-05 DIAGNOSIS — N179 Acute kidney failure, unspecified: Secondary | ICD-10-CM | POA: Diagnosis not present

## 2023-08-05 LAB — RENAL FUNCTION PANEL
Albumin: 2.6 g/dL — ABNORMAL LOW (ref 3.5–5.0)
Anion gap: 9 (ref 5–15)
BUN: 67 mg/dL — ABNORMAL HIGH (ref 6–20)
CO2: 24 mmol/L (ref 22–32)
Calcium: 8.4 mg/dL — ABNORMAL LOW (ref 8.9–10.3)
Chloride: 102 mmol/L (ref 98–111)
Creatinine, Ser: 2.18 mg/dL — ABNORMAL HIGH (ref 0.44–1.00)
GFR, Estimated: 28 mL/min — ABNORMAL LOW (ref >60)
Glucose, Bld: 199 mg/dL — ABNORMAL HIGH (ref 70–99)
Phosphorus: 5.3 mg/dL — ABNORMAL HIGH (ref 2.5–4.6)
Potassium: 4.1 mmol/L (ref 3.5–5.1)
Sodium: 135 mmol/L (ref 135–145)

## 2023-08-05 LAB — CBC
HCT: 37.2 % (ref 36.0–46.0)
Hemoglobin: 11.6 g/dL — ABNORMAL LOW (ref 12.0–15.0)
MCH: 27.8 pg (ref 26.0–34.0)
MCHC: 31.2 g/dL (ref 30.0–36.0)
MCV: 89.2 fL (ref 80.0–100.0)
Platelets: 226 10*3/uL (ref 150–400)
RBC: 4.17 MIL/uL (ref 3.87–5.11)
RDW: 13.5 % (ref 11.5–15.5)
WBC: 6.7 10*3/uL (ref 4.0–10.5)
nRBC: 0 % (ref 0.0–0.2)

## 2023-08-05 LAB — MAGNESIUM: Magnesium: 2.2 mg/dL (ref 1.7–2.4)

## 2023-08-05 LAB — GLUCOSE, CAPILLARY
Glucose-Capillary: 185 mg/dL — ABNORMAL HIGH (ref 70–99)
Glucose-Capillary: 136 mg/dL — ABNORMAL HIGH (ref 70–99)
Glucose-Capillary: 177 mg/dL — ABNORMAL HIGH (ref 70–99)
Glucose-Capillary: 148 mg/dL — ABNORMAL HIGH (ref 70–99)

## 2023-08-05 NOTE — Progress Notes (Signed)
 OT Cancellation Note  Patient Details Name: Karen Bennett MRN: [id number] DOB: [birth year]   Cancelled Treatment:    Reason Eval/Treat Not Completed: Other (comment);Patient not medically ready: Pt does not yet feel medically ready as she expressed that she is still awaiting decreased lower body fluid OL and does not want to try mobilizing until her edema has decreased. Pt ed importance of mobilizing to avoid hospital-induced weakness. Pt agreed and demonstrated UB movements in bed to try and maintain her strength.  Pt apologetic and asked for OT to keep trying as pt stated that she wants oto work with therapy as soon as she feels physically able.   Delon Falter 08/05/2023, 1:44 PM

## 2023-08-05 NOTE — Plan of Care (Signed)
  Problem: Coping: Goal: Level of anxiety will decrease Outcome: Progressing   Problem: Pain Management: Goal: General experience of comfort will improve Outcome: Progressing   Problem: Safety: Goal: Ability to remain free from injury will improve Outcome: Progressing   Problem: Skin Integrity: Goal: Risk for impaired skin integrity will decrease Outcome: Progressing

## 2023-08-05 NOTE — Progress Notes (Signed)
 PROGRESS NOTE  Karen Bennett FMW:[id number] DOB: 01-29-1976   PCP: Suanne Pfeiffer, NP  Patient is from: Home.  Uses wheelchair at baseline.  DOA: 08/01/2023 LOS: 4  Chief complaints Chief Complaint  Patient presents with   Leg Swelling     Brief Narrative / Interim history: 48 year old female with anxiety, HTN, hypothyroidism, DM-2, CKD-3B, morbid obesity, PE on Eliquis , history of Candida albicans fungemia, MV endocarditis in 12/2020 and right BKA presented to ED with worsening edema, abdominal bloating, orthopnea and PND.  Per patient, she had 2 weeks of abdominal bloating, BLE swelling.  Initially started on Lasix  without significant improvement.  She was transitioned to p.o. torsemide .  However, continued to have worsening symptoms despite compliance. In the last 2 weeks, she has dyspnea with exertion, at baseline wheelchair-bound and gets short of breath with transfers.    BNP is elevated to 423.  CXR consistent with CHF.  Patient was started on IV Lasix .  TTE with LVEF of 35 to 40%, RWMA, RVSP of 69.5, moderate LAE, severe RAE and moderate TR. Started on IV Lasix .  Cardiology consulted and following.  Subjective: Seen and examined earlier this morning.  No major events overnight of this morning.  Continues to endorse tightness, swelling and pain in her legs.  Denies chest pain or respiratory symptoms.  Excellent urine output, about 2.2 L in the last 24 hours.  Net -4.5 L.  Objective: Vitals:   08/04/23 1236 08/04/23 1304 08/04/23 2037 08/05/23 0523  BP: 125/75 134/69 (!) 154/95 124/66  Pulse: 83 85 83 82  Resp: 16 16 19 16   Temp: 98 F (36.7 C) 98 F (36.7 C) 98.1 F (36.7 C) 97.9 F (36.6 C)  TempSrc: Oral  Oral Oral  SpO2: 96% 93% 93% 93%  Weight:      Height:        Examination:  GENERAL: No apparent distress.  Nontoxic. HEENT: MMM.  Vision and hearing grossly intact.  NECK: Supple.  Difficult to assess JVD due to body habitus. RESP:  No IWOB.  Fair aeration  bilaterally but limited exam due to body habitus. CVS:  RRR. Heart sounds normal.  ABD/GI/GU: BS+. Abd soft, NTND.  MSK/EXT: Right BKA.  Significant lymphedema bilaterally. SKIN: no apparent skin lesion or wound NEURO: Awake, alert and oriented appropriately.  No apparent focal neuro deficit. PSYCH: Calm. Normal affect.   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  BNP elevated to 523.  CXR with mild CHF.  TTE with LVEF of 35 to 40% (45% in 02/2023), RWMA, RVSP of 69.5, moderate LAE, severe RAE and moderate TR.  Difficult to assess fluid status due to body habitus.  Net net -4.5 L.  BP and Cr stable. -Appreciate guidance by cardiology-on IV Lasix  120 mg twice daily -Strict intake and output, daily weight, renal functions and electrolytes -Start fluid restriction to 1500 cc a day  AKI on CKD-3B: AKI improving. Recent Labs    02/23/23 0530 03/21/23 1749 03/21/23 1750 03/22/23 0416 08/01/23 1140 08/01/23 1501 08/02/23 0456 08/03/23 0536 08/04/23 0745 08/05/23 0542  BUN 29* 15 13 14  67* 56* 66* 68* 68* 67*  CREATININE 1.98* 1.30* 1.33* 1.71* 2.06* 2.50* 2.43* 2.18* 2.21* 2.18*  -Continue monitoring  IDDM-2 with hyperglycemia and HLD: A1c 7.2% on 1/8. Recent Labs  Lab 08/04/23 1155 08/04/23 1650 08/04/23 2052 08/05/23 0746 08/05/23 1148  GLUCAP 145* 149* 175* 185* 136*  -  Continue current insulin  regimen -Continue home Lipitor   Hypothyroidism: TSH 4.6. -Continue Synthroid    History of PE, in July 2024 -Continue eliquis    Hx of right BKA (HCC), chronic pain in the right hip: Able to transfer from bed to wheelchair at baseline -Noted, continue oxycodone  as needed -PT/OT   morbid obesity- Body mass index is 77.36 kg/m.          DVT prophylaxis:  SCDs Start: 08/01/23 1400 apixaban  (ELIQUIS ) tablet 5 mg  Code Status: Full code Family  Communication: None advised  Level of care: Telemetry Status is: Inpatient Remains inpatient appropriate because: Acute on chronic CHF   Final disposition: Likely home Consultants:  Cardiology  35 minutes with more than 50% spent in reviewing records, counseling patient/family and coordinating care.   Sch Meds:  Scheduled Meds:  apixaban   5 mg Oral BID   atorvastatin   40 mg Oral q1800   Karen Bennett   Topical TID   insulin  aspart  0-15 Units Subcutaneous TID WC   insulin  aspart  0-5 Units Subcutaneous QHS   levothyroxine   75 mcg Oral Q0600   Continuous Infusions:  furosemide  120 mg (08/05/23 0801)   PRN Meds:.acetaminophen  **OR** acetaminophen , albuterol , mouth rinse, oxyCODONE , traZODone   Antimicrobials: Anti-infectives (From admission, onward)    None        I have personally reviewed the following labs and images: CBC: Recent Labs  Lab 08/01/23 1140 08/01/23 1501 08/02/23 0456 08/03/23 0536 08/04/23 0745 08/05/23 0542  WBC 6.3  --  5.0 5.1 5.6 6.7  NEUTROABS 4.9  --   --   --   --   --   HGB 12.3 12.9 11.4* 11.7* 11.5* 11.6*  HCT 39.1 38.0 37.4 38.9 38.1 37.2  MCV 90.5  --  91.4 91.7 92.0 89.2  PLT 227  --  221 234 203 226   BMP &GFR Recent Labs  Lab 08/01/23 1140 08/01/23 1501 08/02/23 0456 08/03/23 0536 08/04/23 0745 08/05/23 0542  NA 132* 137 131* 132* 133* 135  K 5.1 4.3 4.2 4.1 3.7 4.1  CL 104 102 100 102 102 102  CO2 21*  --  22 23 22 24   GLUCOSE 151* 132* 129* 116* 130* 199*  BUN 67* 56* 66* 68* 68* 67*  CREATININE 2.06* 2.50* 2.43* 2.18* 2.21* 2.18*  CALCIUM  8.5*  --  8.3* 8.5* 8.5* 8.4*  MG  --   --   --   --  2.3 2.2  PHOS  --   --   --   --   --  5.3*   Estimated Creatinine Clearance: 62.4 mL/min (A) (by C-G formula based on SCr of 2.18 mg/dL (H)). Liver & Pancreas: Recent Labs  Lab 08/01/23 1140 08/05/23 0542  AST 18  --   ALT 11  --   ALKPHOS 63  --   BILITOT 1.2  --   PROT 7.3  --   ALBUMIN  2.8* 2.6*   No  results for input(s): LIPASE, AMYLASE in the last 168 hours. No results for input(s): AMMONIA in the last 168 hours. Diabetic: No results for input(s): HGBA1C in the last 72 hours.  Recent Labs  Lab 08/04/23 1155 08/04/23 1650 08/04/23 2052 08/05/23 0746 08/05/23 1148  GLUCAP 145* 149* 175* 185* 136*   Cardiac Enzymes: No results for input(s): CKTOTAL, CKMB, CKMBINDEX, TROPONINI in the last 168 hours. No results for input(s): PROBNP in the last 8760 hours. Coagulation Profile: No results for input(s): INR, PROTIME in the last  168 hours. Thyroid  Function Tests: Recent Labs    08/04/23 0745  TSH 4.613*   Lipid Profile: No results for input(s): CHOL, HDL, LDLCALC, TRIG, CHOLHDL, LDLDIRECT in the last 72 hours. Anemia Panel: No results for input(s): VITAMINB12, FOLATE, FERRITIN, TIBC, IRON , RETICCTPCT in the last 72 hours. Urine analysis:    Component Value Date/Time   COLORURINE AMBER (A) 03/22/2023 0426   APPEARANCEUR TURBID (A) 03/22/2023 0426   LABSPEC 1.040 (H) 03/22/2023 0426   PHURINE 5.0 03/22/2023 0426   GLUCOSEU >=500 (A) 03/22/2023 0426   HGBUR LARGE (A) 03/22/2023 0426   BILIRUBINUR NEGATIVE 03/22/2023 0426   KETONESUR NEGATIVE 03/22/2023 0426   PROTEINUR >=300 (A) 03/22/2023 0426   UROBILINOGEN 0.2 09/05/2011 0558   NITRITE POSITIVE (A) 03/22/2023 0426   LEUKOCYTESUR MODERATE (A) 03/22/2023 0426   Sepsis Labs: Invalid input(s): PROCALCITONIN, LACTICIDVEN  Microbiology: No results found for this or any previous visit (from the past 240 hours).  Radiology Studies: No results found.    Shye Doty T. Jesicca Dipierro [facility]  If 7PM-7AM, please contact night-coverage [email or web address] 08/05/2023, 1:06 PM

## 2023-08-05 NOTE — Progress Notes (Signed)
 Rounding Note    Patient Name: Karen Bennett Date of Encounter: 08/05/2023  [facility] Cardiologist: Annabella Scarce, MD   Subjective   Pt still with swelling in legs that is painful     Inpatient Medications    Scheduled Meds:  apixaban   5 mg Oral BID   atorvastatin   40 mg Oral q1800   Gerhardt's butt cream   Topical TID   insulin  aspart  0-15 Units Subcutaneous TID WC   insulin  aspart  0-5 Units Subcutaneous QHS   levothyroxine   75 mcg Oral Q0600   Continuous Infusions:  furosemide  120 mg (08/04/23 1703)   PRN Meds: acetaminophen  **OR** acetaminophen , albuterol , mouth rinse, oxyCODONE , traZODone    Vital Signs    Vitals:   08/04/23 1236 08/04/23 1304 08/04/23 2037 08/05/23 0523  BP: 125/75 134/69 (!) 154/95 124/66  Pulse: 83 85 83 82  Resp: 16 16 19 16   Temp: 98 F (36.7 C) 98 F (36.7 C) 98.1 F (36.7 C) 97.9 F (36.6 C)  TempSrc: Oral  Oral Oral  SpO2: 96% 93% 93% 93%  Weight:      Height:        Intake/Output Summary (Last 24 hours) at 08/05/2023 0643 Last data filed at 08/05/2023 0500 Gross per 24 hour  Intake --  Output 2850 ml  Net -2850 ml      08/03/2023    1:00 PM 03/21/2023    6:33 PM 03/21/2023    5:00 PM  Last 3 Weights  Weight (lbs) 479 lb 4.5 oz 361 lb 8.9 oz 361 lb 8.9 oz  Weight (kg) 217.4 kg 164 kg 164 kg      Telemetry    SR 80s  - Personally Reviewed  ECG    No new tracings - Personally Reviewed  Physical Exam   GEN: super morbid obesity, significant LE edema, right BKA  Neck:Neck is full  Cardiac: RRR, no murmurs Respiratory:Relatively clear  GI  Obese  MS: L LE edema   S/P right BKA  Thigh edema    Labs    High Sensitivity Troponin:   Recent Labs  Lab 08/01/23 1140 08/01/23 1414  TROPONINIHS 33* 36*     Chemistry Recent Labs  Lab 08/01/23 1140 08/01/23 1501 08/02/23 0456 08/03/23 0536 08/04/23 0745  NA 132*   < > 131* 132* 133*  K 5.1   < > 4.2 4.1 3.7  CL 104   < > 100 102 102  CO2  21*  --  22 23 22   GLUCOSE 151*   < > 129* 116* 130*  BUN 67*   < > 66* 68* 68*  CREATININE 2.06*   < > 2.43* 2.18* 2.21*  CALCIUM  8.5*  --  8.3* 8.5* 8.5*  MG  --   --   --   --  2.3  PROT 7.3  --   --   --   --   ALBUMIN  2.8*  --   --   --   --   AST 18  --   --   --   --   ALT 11  --   --   --   --   ALKPHOS 63  --   --   --   --   BILITOT 1.2  --   --   --   --   GFRNONAA 30*  --  24* 28* 27*  ANIONGAP 7  --  9 7 9    < > =  values in this interval not displayed.    Lipids No results for input(s): CHOL, TRIG, HDL, LABVLDL, LDLCALC, CHOLHDL in the last 168 hours.  Hematology Recent Labs  Lab 08/02/23 0456 08/03/23 0536 08/04/23 0745  WBC 5.0 5.1 5.6  RBC 4.09 4.24 4.14  HGB 11.4* 11.7* 11.5*  HCT 37.4 38.9 38.1  MCV 91.4 91.7 92.0  MCH 27.9 27.6 27.8  MCHC 30.5 30.1 30.2  RDW 13.4 13.4 13.4  PLT 221 234 203   Thyroid   Recent Labs  Lab 08/04/23 0745  TSH 4.613*    BNP Recent Labs  Lab 08/01/23 1140  BNP 523.3*    DDimer No results for input(s): DDIMER in the last 168 hours.   Radiology    No results found.   Cardiac Studies   Echo 08/02/23:  1. No apical thrombus with Definity . Left ventricular ejection fraction,  by estimation, is 35 to 40%. Left ventricular ejection fraction by 2D MOD  biplane is 40.8 %. The left ventricle has moderately decreased function.  The left ventricle demonstrates  regional wall motion abnormalities (see scoring diagram/findings for  description). The left ventricular internal cavity size was mildly  dilated. Left ventricular diastolic parameters are consistent with Grade  III diastolic dysfunction (restrictive).  Elevated left ventricular end-diastolic pressure. There is abnormal  (paradoxical) septal motion, consistent with right ventricular volume  overload. There is severe akinesis of the left ventricular, mid inferior  wall and inferolateral wall.   2. Right ventricular systolic function is low normal. The  right  ventricular size is normal. There is severely elevated pulmonary artery  systolic pressure. The estimated right ventricular systolic pressure is  69.5 mmHg.   3. Left atrial size was moderately dilated.   4. Right atrial size was severely dilated.   5. The mitral valve is abnormal. Mild mitral valve regurgitation.   6. The tricuspid valve is abnormal. Tricuspid valve regurgitation is  moderate.   7. The aortic valve is tricuspid. Aortic valve regurgitation is not  visualized. No aortic stenosis is present.   8. The inferior vena cava is dilated in size with <50% respiratory  variability, suggesting right atrial pressure of 15 mmHg.   Patient Profile     48 y.o. 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, osteomyelitis 2014, who is being seen for the evaluation of CHF   Assessment & Plan    HFrEF     -Echocardiogram this admission with an LVEF 35-40%, RWMA with  akinesis of the LV, mid inferior wall and inferolateral wall, grade 3 DD, elevated LVEDP/ PASP 69.5 mmHg, mild LAE, severe RAE, mild MR, moderate TR, -Cardiology was consulted for help with diuresis-Lasix  increased to 100 mg twice daily with metolazone  x 1 She remains volume overloaded but is slowly improving  I would continue on 120 lasix  IV bid    Continue to follow I/O, wt, renal function     OHS/OSA -- she has not been screened for sleep apnea   AKI on CKD stage III - sCr 2.18 today   Follow  Hx of DVT/PE Chronic anticoagulation    For questions or updates, please contact [facility] Please consult [email or web address] for contact info under        Signed, Vina Gull, MD  08/05/2023, 6:43 AM

## 2023-08-06 DIAGNOSIS — I5033 Acute on chronic diastolic (congestive) heart failure: Secondary | ICD-10-CM | POA: Diagnosis not present

## 2023-08-06 DIAGNOSIS — Z86711 Personal history of pulmonary embolism: Secondary | ICD-10-CM | POA: Diagnosis not present

## 2023-08-06 DIAGNOSIS — N179 Acute kidney failure, unspecified: Secondary | ICD-10-CM | POA: Diagnosis not present

## 2023-08-06 LAB — RENAL FUNCTION PANEL
Albumin: 2.5 g/dL — ABNORMAL LOW (ref 3.5–5.0)
Anion gap: 8 (ref 5–15)
BUN: 65 mg/dL — ABNORMAL HIGH (ref 6–20)
CO2: 26 mmol/L (ref 22–32)
Calcium: 8.5 mg/dL — ABNORMAL LOW (ref 8.9–10.3)
Chloride: 101 mmol/L (ref 98–111)
Creatinine, Ser: 2.06 mg/dL — ABNORMAL HIGH (ref 0.44–1.00)
GFR, Estimated: 30 mL/min — ABNORMAL LOW (ref >60)
Glucose, Bld: 160 mg/dL — ABNORMAL HIGH (ref 70–99)
Phosphorus: 4.9 mg/dL — ABNORMAL HIGH (ref 2.5–4.6)
Potassium: 3 mmol/L — ABNORMAL LOW (ref 3.5–5.1)
Sodium: 135 mmol/L (ref 135–145)

## 2023-08-06 LAB — CBC
HCT: 35.4 % — ABNORMAL LOW (ref 36.0–46.0)
Hemoglobin: 11.5 g/dL — ABNORMAL LOW (ref 12.0–15.0)
MCH: 28.2 pg (ref 26.0–34.0)
MCHC: 32.5 g/dL (ref 30.0–36.0)
MCV: 86.8 fL (ref 80.0–100.0)
Platelets: 216 10*3/uL (ref 150–400)
RBC: 4.08 MIL/uL (ref 3.87–5.11)
RDW: 13.7 % (ref 11.5–15.5)
WBC: 5.8 10*3/uL (ref 4.0–10.5)
nRBC: 0 % (ref 0.0–0.2)

## 2023-08-06 LAB — GLUCOSE, CAPILLARY
Glucose-Capillary: 150 mg/dL — ABNORMAL HIGH (ref 70–99)
Glucose-Capillary: 139 mg/dL — ABNORMAL HIGH (ref 70–99)
Glucose-Capillary: 116 mg/dL — ABNORMAL HIGH (ref 70–99)
Glucose-Capillary: 140 mg/dL — ABNORMAL HIGH (ref 70–99)

## 2023-08-06 LAB — MAGNESIUM: Magnesium: 2.1 mg/dL (ref 1.7–2.4)

## 2023-08-06 MED ORDER — LIDOCAINE 5 % EX PTCH
1.0000 | MEDICATED_PATCH | CUTANEOUS | Status: DC
  Administered 2023-08-06 – 2023-08-20 (×15): 1 via TRANSDERMAL
  Filled 2023-08-06 (×16): qty 1

## 2023-08-06 MED ORDER — POTASSIUM CHLORIDE CRYS ER 20 MEQ PO TBCR
40.0000 meq | EXTENDED_RELEASE_TABLET | ORAL | Status: AC
  Administered 2023-08-06 (×2): 40 meq via ORAL
  Filled 2023-08-06 (×2): qty 2

## 2023-08-06 NOTE — Progress Notes (Signed)
 PROGRESS NOTE  Karen Bennett FMW:[id number] DOB: [birth year]   PCP: Suanne Pfeiffer, NP  Patient is from: Home.  Uses wheelchair at baseline.  DOA: 08/01/2023 LOS: 5  Chief complaints Chief Complaint  Patient presents with   Leg Swelling     Brief Narrative / Interim history: 48 year old female with anxiety, HTN, hypothyroidism, DM-2, CKD-3B, morbid obesity, PE on Eliquis , history of Candida albicans fungemia, MV endocarditis in 12/2020 and right BKA presented to ED with worsening edema, abdominal bloating, orthopnea and PND.  Per patient, she had 2 weeks of abdominal bloating, BLE swelling.  Initially started on Lasix  without significant improvement.  She was transitioned to p.o. torsemide .  However, continued to have worsening symptoms despite compliance. In the last 2 weeks, she has dyspnea with exertion, at baseline wheelchair-bound and gets short of breath with transfers.    BNP is elevated to 423.  CXR consistent with CHF.  Patient was started on IV Lasix .  TTE with LVEF of 35 to 40%, RWMA, RVSP of 69.5, moderate LAE, severe RAE and moderate TR. Started on IV Lasix .  Cardiology consulted and following.  Plan for [facility] when fluid status improves.  Subjective: Seen and examined earlier this morning.  No major events overnight of this morning.  She reports a cyst on her right flank.  This is chronic but more tender now.  She is asking for ultrasound.  Still with tightness, swelling and pain in legs.  No shortness of breath or chest pain.  Diuresed about 3 L in the last 24 hours.  Net -6.7 L so far.  Creatinine improving.  Objective: Vitals:   08/06/23 0420 08/06/23 0830 08/06/23 1240 08/06/23 1321  BP: 126/71 134/75  127/87  Pulse: 81 82  83  Resp: 18     Temp: 98 F (36.7 C)   98.1 F (36.7 C)  TempSrc: Oral   Oral  SpO2: 94%   95%  Weight:   (!) 217.4 kg   Height:        Examination:  GENERAL: No apparent distress.  Nontoxic. HEENT: MMM.  Vision and hearing grossly  intact.  NECK: Supple.  Difficult to assess JVD due to body habitus. RESP:  No IWOB.  Fair aeration bilaterally but limited exam due to body habitus. CVS:  RRR. Heart sounds normal.  ABD/GI/GU: BS+. Abd soft, NTND.  MSK/EXT: Right BKA.  Significant lymphedema bilaterally. SKIN: no apparent skin lesion or wound NEURO: Awake, alert and oriented appropriately.  No apparent focal neuro deficit. PSYCH: Calm. Normal affect.   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  BNP elevated to 523.  CXR with mild CHF.  TTE with LVEF of 35 to 40% (45% in 02/2023), RWMA, RVSP of 69.5, moderate LAE, severe RAE and moderate TR.  Difficult to assess fluid status due to body habitus.  Net net -6.7 L.  BP stable.  Creatinine improving. -Appreciate guidance by cardiology-on IV Lasix  120 mg twice daily, and R/LHC when more compensated -Strict intake and output, daily weight, renal functions and electrolytes -Fluid restriction to 1500 cc a day  AKI on CKD-3B: AKI improving. Recent Labs    03/21/23 1749 03/21/23 1750 03/22/23 0416 08/01/23 1140 08/01/23 1501 08/02/23 0456 08/03/23 0536 08/04/23 0745 08/05/23 0542 08/06/23 0456  BUN 15 13 14  67* 56* 66* 68* 68* 67* 65*  CREATININE 1.30* 1.33* 1.71* 2.06* 2.50* 2.43* 2.18* 2.21* 2.18* 2.06*  -Continue  monitoring  IDDM-2 with hyperglycemia and HLD: A1c 7.2% on 1/8. Recent Labs  Lab 08/05/23 1148 08/05/23 1648 08/05/23 2111 08/06/23 0756 08/06/23 1211  GLUCAP 136* 177* 148* 150* 139*  -Continue current insulin  regimen -Continue home Lipitor   Hypothyroidism: TSH 4.6. -Continue Synthroid    History of PE, in July 2024 -Continue eliquis    Hx of right BKA (HCC), chronic pain in the right hip: Able to transfer from bed to wheelchair at baseline -Noted, continue oxycodone  as needed -PT/OT  Right flank pain/tenderness: No erythema  or signs of infection.  Fat necrosis?  Doubt utility of ultrasound -Lidoderm  patch  Hypokalemia: Likely due to diuretics -Monitor replenish as appropriate   Morbid obesity- Body mass index is 77.36 kg/m.          DVT prophylaxis:  SCDs Start: 08/01/23 1400 apixaban  (ELIQUIS ) tablet 5 mg  Code Status: Full code Family Communication: None at bedside. Level of care: Telemetry Status is: Inpatient Remains inpatient appropriate because: Acute on chronic CHF   Final disposition: Likely home Consultants:  Cardiology  35 minutes with more than 50% spent in reviewing records, counseling patient/family and coordinating care.   Sch Meds:  Scheduled Meds:  apixaban   5 mg Oral BID   atorvastatin   40 mg Oral q1800   Gerhardt's butt cream   Topical TID   insulin  aspart  0-15 Units Subcutaneous TID WC   insulin  aspart  0-5 Units Subcutaneous QHS   levothyroxine   75 mcg Oral Q0600   lidocaine   1 patch Transdermal Q24H   Continuous Infusions:  furosemide  120 mg (08/06/23 0830)   PRN Meds:.acetaminophen  **OR** acetaminophen , albuterol , mouth rinse, oxyCODONE , traZODone   Antimicrobials: Anti-infectives (From admission, onward)    None        I have personally reviewed the following labs and images: CBC: Recent Labs  Lab 08/01/23 1140 08/01/23 1501 08/02/23 0456 08/03/23 0536 08/04/23 0745 08/05/23 0542 08/06/23 0456  WBC 6.3  --  5.0 5.1 5.6 6.7 5.8  NEUTROABS 4.9  --   --   --   --   --   --   HGB 12.3   < > 11.4* 11.7* 11.5* 11.6* 11.5*  HCT 39.1   < > 37.4 38.9 38.1 37.2 35.4*  MCV 90.5  --  91.4 91.7 92.0 89.2 86.8  PLT 227  --  221 234 203 226 216   < > = values in this interval not displayed.   BMP &GFR Recent Labs  Lab 08/02/23 0456 08/03/23 0536 08/04/23 0745 08/05/23 0542 08/06/23 0456  NA 131* 132* 133* 135 135  K 4.2 4.1 3.7 4.1 3.0*  CL 100 102 102 102 101  CO2 22 23 22 24 26   GLUCOSE 129* 116* 130* 199* 160*  BUN 66* 68* 68* 67* 65*   CREATININE 2.43* 2.18* 2.21* 2.18* 2.06*  CALCIUM  8.3* 8.5* 8.5* 8.4* 8.5*  MG  --   --  2.3 2.2 2.1  PHOS  --   --   --  5.3* 4.9*   Estimated Creatinine Clearance: 66 mL/min (A) (by C-G formula based on SCr of 2.06 mg/dL (H)). Liver & Pancreas: Recent Labs  Lab 08/01/23 1140 08/05/23 0542 08/06/23 0456  AST 18  --   --   ALT 11  --   --   ALKPHOS 63  --   --   BILITOT 1.2  --   --   PROT 7.3  --   --   ALBUMIN  2.8* 2.6* 2.5*  No results for input(s): LIPASE, AMYLASE in the last 168 hours. No results for input(s): AMMONIA in the last 168 hours. Diabetic: No results for input(s): HGBA1C in the last 72 hours.  Recent Labs  Lab 08/05/23 1148 08/05/23 1648 08/05/23 2111 08/06/23 0756 08/06/23 1211  GLUCAP 136* 177* 148* 150* 139*   Cardiac Enzymes: No results for input(s): CKTOTAL, CKMB, CKMBINDEX, TROPONINI in the last 168 hours. No results for input(s): PROBNP in the last 8760 hours. Coagulation Profile: No results for input(s): INR, PROTIME in the last 168 hours. Thyroid  Function Tests: Recent Labs    08/04/23 0745  TSH 4.613*   Lipid Profile: No results for input(s): CHOL, HDL, LDLCALC, TRIG, CHOLHDL, LDLDIRECT in the last 72 hours. Anemia Panel: No results for input(s): VITAMINB12, FOLATE, FERRITIN, TIBC, IRON , RETICCTPCT in the last 72 hours. Urine analysis:    Component Value Date/Time   COLORURINE AMBER (A) 03/22/2023 0426   APPEARANCEUR TURBID (A) 03/22/2023 0426   LABSPEC 1.040 (H) 03/22/2023 0426   PHURINE 5.0 03/22/2023 0426   GLUCOSEU >=500 (A) 03/22/2023 0426   HGBUR LARGE (A) 03/22/2023 0426   BILIRUBINUR NEGATIVE 03/22/2023 0426   KETONESUR NEGATIVE 03/22/2023 0426   PROTEINUR >=300 (A) 03/22/2023 0426   UROBILINOGEN 0.2 09/05/2011 0558   NITRITE POSITIVE (A) 03/22/2023 0426   LEUKOCYTESUR MODERATE (A) 03/22/2023 0426   Sepsis Labs: Invalid input(s): PROCALCITONIN,  LACTICIDVEN  Microbiology: No results found for this or any previous visit (from the past 240 hours).  Radiology Studies: No results found.    Zaira Iacovelli T. Kieth Hartis [facility]  If 7PM-7AM, please contact night-coverage [email or web address] 08/06/2023, 4:16 PM

## 2023-08-06 NOTE — Progress Notes (Signed)
 Heart Failure Navigator Progress Note  Assessed for Heart & Vascular TOC clinic readiness.  Patient does not meet criteria due to per MD note wheel chair bound. .   Navigator will sign off at this time.   Stephane Haddock, BSN, Scientist, Clinical (histocompatibility And Immunogenetics) Only

## 2023-08-06 NOTE — Plan of Care (Signed)

## 2023-08-06 NOTE — Plan of Care (Signed)
  Problem: Fluid Volume: Goal: Ability to maintain a balanced intake and output will improve Outcome: Progressing   Problem: Nutritional: Goal: Maintenance of adequate nutrition will improve Outcome: Progressing   Problem: Tissue Perfusion: Goal: Adequacy of tissue perfusion will improve Outcome: Progressing   Problem: Clinical Measurements: Goal: Respiratory complications will improve Outcome: Progressing   Problem: Pain Management: Goal: General experience of comfort will improve Outcome: Progressing   Problem: Skin Integrity: Goal: Risk for impaired skin integrity will decrease Outcome: Progressing

## 2023-08-06 NOTE — Progress Notes (Addendum)
 Progress Note  Patient Name: Karen Bennett Date of Encounter: 08/06/2023  Primary Cardiologist: Annabella Scarce, MD  Subjective   Feeling slightly better but still feels swollen all over. Still SOB with transfers  Inpatient Medications    Scheduled Meds:  apixaban   5 mg Oral BID   atorvastatin   40 mg Oral q1800   Gerhardt's butt cream   Topical TID   insulin  aspart  0-15 Units Subcutaneous TID WC   insulin  aspart  0-5 Units Subcutaneous QHS   levothyroxine   75 mcg Oral Q0600   potassium chloride   40 mEq Oral Q3H   Continuous Infusions:  furosemide  120 mg (08/06/23 0830)   PRN Meds: acetaminophen  **OR** acetaminophen , albuterol , mouth rinse, oxyCODONE , traZODone    Vital Signs    Vitals:   08/05/23 1317 08/05/23 2113 08/06/23 0420 08/06/23 0830  BP: (!) 184/89 133/75 126/71 134/75  Pulse: 88 81 81 82  Resp: 18 19 18    Temp: 98.1 F (36.7 C) 98 F (36.7 C) 98 F (36.7 C)   TempSrc: Oral Oral Oral   SpO2: 95% 94% 94%   Weight:      Height:        Intake/Output Summary (Last 24 hours) at 08/06/2023 1048 Last data filed at 08/06/2023 0945 Gross per 24 hour  Intake 1081.88 ml  Output 3250 ml  Net -2168.12 ml      08/03/2023    1:00 PM 03/21/2023    6:33 PM 03/21/2023    5:00 PM  Last 3 Weights  Weight (lbs) 479 lb 4.5 oz 361 lb 8.9 oz 361 lb 8.9 oz  Weight (kg) 217.4 kg 164 kg 164 kg     Telemetry    NSR - Personally Reviewed  Physical Exam   GEN: No acute distress. Super morbidly obese. HEENT: Normocephalic, atraumatic, sclera non-icteric. Neck: No JVD or bruits. Cardiac: RRR no murmurs, rubs, or gallops.  Respiratory: Clear to auscultation bilaterally. Breathing is unlabored. GI: Soft, nontender, non-distended, BS +x 4. MS: no deformity. Extremities: No clubbing or cyanosis. Diffuse lymphedema noted. S/p R BKA. Neuro:  AAOx3. Follows commands. Psych:  Responds to questions appropriately with a normal affect.  Labs    High Sensitivity  Troponin:   Recent Labs  Lab 08/01/23 1140 08/01/23 1414  TROPONINIHS 33* 36*      Cardiac EnzymesNo results for input(s): TROPONINI in the last 168 hours. No results for input(s): TROPIPOC in the last 168 hours.   Chemistry Recent Labs  Lab 08/01/23 1140 08/01/23 1501 08/04/23 0745 08/05/23 0542 08/06/23 0456  NA 132*   < > 133* 135 135  K 5.1   < > 3.7 4.1 3.0*  CL 104   < > 102 102 101  CO2 21*   < > 22 24 26   GLUCOSE 151*   < > 130* 199* 160*  BUN 67*   < > 68* 67* 65*  CREATININE 2.06*   < > 2.21* 2.18* 2.06*  CALCIUM  8.5*   < > 8.5* 8.4* 8.5*  PROT 7.3  --   --   --   --   ALBUMIN  2.8*  --   --  2.6* 2.5*  AST 18  --   --   --   --   ALT 11  --   --   --   --   ALKPHOS 63  --   --   --   --   BILITOT 1.2  --   --   --   --  GFRNONAA 30*   < > 27* 28* 30*  ANIONGAP 7   < > 9 9 8    < > = values in this interval not displayed.     Hematology Recent Labs  Lab 08/04/23 0745 08/05/23 0542 08/06/23 0456  WBC 5.6 6.7 5.8  RBC 4.14 4.17 4.08  HGB 11.5* 11.6* 11.5*  HCT 38.1 37.2 35.4*  MCV 92.0 89.2 86.8  MCH 27.8 27.8 28.2  MCHC 30.2 31.2 32.5  RDW 13.4 13.5 13.7  PLT 203 226 216    BNP Recent Labs  Lab 08/01/23 1140  BNP 523.3*     DDimer No results for input(s): DDIMER in the last 168 hours.   Radiology    No results found.  Cardiac Studies   Echo 08/02/23   1. No apical thrombus with Definity . Left ventricular ejection fraction,  by estimation, is 35 to 40%. Left ventricular ejection fraction by 2D MOD  biplane is 40.8 %. The left ventricle has moderately decreased function.  The left ventricle demonstrates  regional wall motion abnormalities (see scoring diagram/findings for  description). The left ventricular internal cavity size was mildly  dilated. Left ventricular diastolic parameters are consistent with Grade  III diastolic dysfunction (restrictive).  Elevated left ventricular end-diastolic pressure. There is abnormal   (paradoxical) septal motion, consistent with right ventricular volume  overload. There is severe akinesis of the left ventricular, mid inferior  wall and inferolateral wall.   2. Right ventricular systolic function is low normal. The right  ventricular size is normal. There is severely elevated pulmonary artery  systolic pressure. The estimated right ventricular systolic pressure is  69.5 mmHg.   3. Left atrial size was moderately dilated.   4. Right atrial size was severely dilated.   5. The mitral valve is abnormal. Mild mitral valve regurgitation.   6. The tricuspid valve is abnormal. Tricuspid valve regurgitation is  moderate.   7. The aortic valve is tricuspid. Aortic valve regurgitation is not  visualized. No aortic stenosis is present.   8. The inferior vena cava is dilated in size with <50% respiratory  variability, suggesting right atrial pressure of 15 mmHg.   Comparison(s): Changes from prior study are noted. 03/23/2023: LVEF 45%  with apical hypokinesis.   Conclusion(s)/Recommendation(s): Compared to the prior study, there appear  to be new regional WMA's - suggestive of LCX territory ischemia/infarct.   Patient Profile   48 y.o. 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 Wheelchair bound at baseline. Cardiology following for HF.   Assessment & Plan    1. Acute on chronic HFrEF, right heart failure, severe pHTN with progressive cardiomyopathy - previous low-normal EF 50-55% then 45-50% in 02/2023 then 35-40% this admission with G3DD, elevated LVEDP, abnormal (paradoxical) septal  motion, consistent with right ventricular volume overload, severe akinesis of the left ventricular, mid inferior wall and inferolateral wall, severe pulm HTN, moderate LAE, severe RAE, mild MR, moderate TR, dilated IVC - suspect right heart / severe pHTN due to HF in setting of severe obesity with suspected OHS/OSA - cardiology consulted 08/01/22 for assistance with diuresis, IV Lasix  increased, got dose of metolazone  on 1/9 and 1/10 - I/O's suggest diuresing well - endpoint not  entirely clear, exam difficult, but improving renal parameters and symptoms suggest OK to continue present regimen - can reconsider metolazone  when potassium better - not weighed since 1/10, will request  2. Super morbid obesity with suspected OSA/OHS - needs eval for sleep apnea - will need assessment for exertional home O2 prior to dc  3. AKI on CKD 3a - previous Cr 1.3-1.7 in 02/2023, here peaking 2.50, trending downward - follow  4. H/o PE/DVT, stroke, PFO - continue anticoagulation - can revisit PFO as outpatient though poor surgical candidate currently  5. Elevated troponin - HS-troponin mildly elevated felt likely due to her heart failure exacerbation - however, given decline in LVEF and WMA, Dr. Raford recommended considering LHC when Cr improved - cath lab table weight limit is 450lb so presently not a candidate - no ASA presently given concomitant Eliquis  - on statin therapy  6. Hypokalemia - per primary team  7. Hypothyroidism - TSH elevated at 4.613 this admission, further per primary team    For questions or updates, please contact [facility] Please consult [email or web address] for contact info under Cardiology/STEMI.  Signed, Yomaira Solar N Hildagarde Holleran, PA-C 08/06/2023, 10:48 AM

## 2023-08-06 NOTE — Progress Notes (Signed)
 PT Cancellation Note  Patient Details Name: Karen Bennett MRN: [id number] DOB: [birth year]   Cancelled Treatment:    Reason Eval/Treat Not Completed: PT screened, no needs identified, will sign off. Multiple attempts by therapy to work with patient. Pt currently declines working with therapy 2* feels she still has too much fluid limiting her mobility to safely mobilize. Will sign off at this time. Instructed pt to notifty RN/MD to reorder PT when she is willing and feels ready to participate.     Dannial SQUIBB, PT Acute Rehabilitation  Office: [phone]

## 2023-08-06 NOTE — Progress Notes (Signed)
 Rounding Note    Patient Name: Karen Bennett Date of Encounter: 08/06/2023  [facility] Cardiologist: Annabella Scarce, MD   Subjective   No acute overnight events. She feels like her breathing and swelling have improved but are not back to normal yet. No chest pain.   Inpatient Medications    Scheduled Meds:  apixaban   5 mg Oral BID   atorvastatin   40 mg Oral q1800   Gerhardt's butt cream   Topical TID   insulin  aspart  0-15 Units Subcutaneous TID WC   insulin  aspart  0-5 Units Subcutaneous QHS   levothyroxine   75 mcg Oral Q0600   lidocaine   1 patch Transdermal Q24H   Continuous Infusions:  furosemide  120 mg (08/06/23 1859)   PRN Meds: acetaminophen  **OR** acetaminophen , albuterol , mouth rinse, oxyCODONE , traZODone    Vital Signs    Vitals:   08/06/23 0420 08/06/23 0830 08/06/23 1240 08/06/23 1321  BP: 126/71 134/75  127/87  Pulse: 81 82  83  Resp: 18     Temp: 98 F (36.7 C)   98.1 F (36.7 C)  TempSrc: Oral   Oral  SpO2: 94%   95%  Weight:   (!) 217.4 kg   Height:        Intake/Output Summary (Last 24 hours) at 08/06/2023 1859 Last data filed at 08/06/2023 1500 Gross per 24 hour  Intake 1261.88 ml  Output 3650 ml  Net -2388.12 ml      08/06/2023   12:40 PM 08/03/2023    1:00 PM 03/21/2023    6:33 PM  Last 3 Weights  Weight (lbs) 479 lb 4.5 oz 479 lb 4.5 oz 361 lb 8.9 oz  Weight (kg) 217.4 kg 217.4 kg 164 kg      Telemetry    Normal sinus rhythm with rates in the 80s to 90s. - Personally Reviewed  ECG    No new ECG tracing today. - Personally Reviewed  Physical Exam   GEN: No acute distress.   Neck: JVD difficult to assess due to body habitus. Cardiac: RRR. No murmurs, rubs, or gallops.  Respiratory: No increased work of breathing. Clear to auscultation bilaterally. No wheeze, rhonchi, or rales. MS: Bilateral lower extremity edema. S/p right below knee amputation.  Neuro:  No focal deficits.  Psych: Normal affect. Responds  appropriately.   Labs    High Sensitivity Troponin:   Recent Labs  Lab 08/01/23 1140 08/01/23 1414  TROPONINIHS 33* 36*     Chemistry Recent Labs  Lab 08/01/23 1140 08/01/23 1501 08/04/23 0745 08/05/23 0542 08/06/23 0456  NA 132*   < > 133* 135 135  K 5.1   < > 3.7 4.1 3.0*  CL 104   < > 102 102 101  CO2 21*   < > 22 24 26   GLUCOSE 151*   < > 130* 199* 160*  BUN 67*   < > 68* 67* 65*  CREATININE 2.06*   < > 2.21* 2.18* 2.06*  CALCIUM  8.5*   < > 8.5* 8.4* 8.5*  MG  --   --  2.3 2.2 2.1  PROT 7.3  --   --   --   --   ALBUMIN  2.8*  --   --  2.6* 2.5*  AST 18  --   --   --   --   ALT 11  --   --   --   --   ALKPHOS 63  --   --   --   --  BILITOT 1.2  --   --   --   --   GFRNONAA 30*   < > 27* 28* 30*  ANIONGAP 7   < > 9 9 8    < > = values in this interval not displayed.    Lipids No results for input(s): CHOL, TRIG, HDL, LABVLDL, LDLCALC, CHOLHDL in the last 168 hours.  Hematology Recent Labs  Lab 08/04/23 0745 08/05/23 0542 08/06/23 0456  WBC 5.6 6.7 5.8  RBC 4.14 4.17 4.08  HGB 11.5* 11.6* 11.5*  HCT 38.1 37.2 35.4*  MCV 92.0 89.2 86.8  MCH 27.8 27.8 28.2  MCHC 30.2 31.2 32.5  RDW 13.4 13.5 13.7  PLT 203 226 216   Thyroid   Recent Labs  Lab 08/04/23 0745  TSH 4.613*    BNP Recent Labs  Lab 08/01/23 1140  BNP 523.3*    DDimer No results for input(s): DDIMER in the last 168 hours.   Radiology    No results found.  Cardiac Studies   Echocardiogram 08/02/2023: Impressions: 1. No apical thrombus with Definity . Left ventricular ejection fraction,  by estimation, is 35 to 40%. Left ventricular ejection fraction by 2D MOD  biplane is 40.8 %. The left ventricle has moderately decreased function.  The left ventricle demonstrates  regional wall motion abnormalities (see scoring diagram/findings for  description). The left ventricular internal cavity size was mildly  dilated. Left ventricular diastolic parameters are consistent with  Grade  III diastolic dysfunction (restrictive).  Elevated left ventricular end-diastolic pressure. There is abnormal  (paradoxical) septal motion, consistent with right ventricular volume  overload. There is severe akinesis of the left ventricular, mid inferior  wall and inferolateral wall.   2. Right ventricular systolic function is low normal. The right  ventricular size is normal. There is severely elevated pulmonary artery  systolic pressure. The estimated right ventricular systolic pressure is  69.5 mmHg.   3. Left atrial size was moderately dilated.   4. Right atrial size was severely dilated.   5. The mitral valve is abnormal. Mild mitral valve regurgitation.   6. The tricuspid valve is abnormal. Tricuspid valve regurgitation is  moderate.   7. The aortic valve is tricuspid. Aortic valve regurgitation is not  visualized. No aortic stenosis is present.   8. The inferior vena cava is dilated in size with <50% respiratory  variability, suggesting right atrial pressure of 15 mmHg.    Patient Profile     48 y.o. 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   Assessment & Plan    Acute on Chronic Combined CHF Severe Pulmonary Hypertension Patient presented with worsening shortness of breath and edema. BNP 523. Chest x-ray was suggestive of mild CHF. Echo showed LVEF of 35-40% with severe akinesis of the mid inferior wall and inferolateral wall as well as grade 3 diastolic dysfunction and severely elevated PASP. She has been diuresed with IV Lasix  and  intermittent Metolazone . Documented urinary output of 3.75 yesterday and net negative 9.59 L this admission. Weighs 475 lbs today (down 4 lbs from yesterday). Renal function relatively stable. - Volume status difficult to assess due  to morbid obesity. However, patient states she has had significant weigh gain since. She states she weighed 320 lbs at that time and now weighs 475 lbs. - Continue IV Lasix  120mg  twice daily.  - Potassium has now improved so will discuss giving another dose of Metolazone  with MD. - No ACEi/ ARB/ ARNI or MRA given renal function. - She has been on Jardiance at home but she has a history of fungemia and I have concerns that she is at increased risk for infection with her super morbid obesity (BMI 77). Continue to hold this for now. - Continue to monitor daily weights, strict I/Os, and renal function.  - Chest CTA on admission showed severe coronary calcifications ane Echo shows declining EF with regional wall motion abnormalities so need to rule out obstructive CAD. Pulmonary hypertension likely due to severe obesity with suspect OHS/ OSA. Will need outpatient sleep study. Plan is for [facility] after she has been adequately diuresed if renal function will allow.  Cath lab table weight limit is 450 lbs   Demand Ischemia High-sensitivity troponin minimally elevated and flat at 33 >> 36. Echo shows LVEF of 30-35% with regional wall motion abnormalities as above. - No chest pain.  - Troponin elevation consistent with demand ischemia due to acute CHF rather than ACS. However, plan is for R/ LHC when adequately diuresed as above.   History of PE/ DVT On chronic anticoagulation at home.  - Continue Eliquis  5mg  twice daily. Will need for 2 days prior to cath but still a ways off from this right now.   Hypertension BP mostly well controlled.  - Continue diuresis as above.  CKD Stage IIIb Creatinine 2.06 on admission. Prior baseline 1.3 to 1.9 in 02/2023.  - Creatinine relatively  stable at 2.20 today. - See recommendations regarding diuresis as above.  Otherwise, per primary team: - Type 2 diabetes mellitus - Hyperlipidemia - Hypothyroidism  - S/p right BKA - Chronic pain - Super morbid obesity  For questions or updates, please contact [facility] Please consult [email or web address] for contact info under        Signed, Hser Belanger E Annita Ratliff, PA-C  08/06/2023, 6:59 PM

## 2023-08-06 NOTE — Progress Notes (Signed)
 OT Cancellation Note  Patient Details Name: Karen Bennett MRN: [id number] DOB: May 21, 1976   Cancelled Treatment:    Reason Eval/Treat Not Completed: Other (comment). Pt does not feel ready to mobilize, as she is still experiencing LE edema and awaiting decreased lower body fluid. Pt pleasant, and will alert MD/RN when she feels ready to participate in OT. OT orders will be completed at this time and will await new consult.  Landyn Lorincz L. Jhamal Plucinski, OTR/L  08/06/23, 10:28 AM

## 2023-08-07 DIAGNOSIS — I5033 Acute on chronic diastolic (congestive) heart failure: Secondary | ICD-10-CM | POA: Diagnosis not present

## 2023-08-07 DIAGNOSIS — Z86711 Personal history of pulmonary embolism: Secondary | ICD-10-CM | POA: Diagnosis not present

## 2023-08-07 DIAGNOSIS — N179 Acute kidney failure, unspecified: Secondary | ICD-10-CM | POA: Diagnosis not present

## 2023-08-07 LAB — CBC
HCT: 37.7 % (ref 36.0–46.0)
Hemoglobin: 11.8 g/dL — ABNORMAL LOW (ref 12.0–15.0)
MCH: 27.8 pg (ref 26.0–34.0)
MCHC: 31.3 g/dL (ref 30.0–36.0)
MCV: 88.7 fL (ref 80.0–100.0)
Platelets: 224 10*3/uL (ref 150–400)
RBC: 4.25 MIL/uL (ref 3.87–5.11)
RDW: 13.7 % (ref 11.5–15.5)
WBC: 5.3 10*3/uL (ref 4.0–10.5)
nRBC: 0 % (ref 0.0–0.2)

## 2023-08-07 LAB — BASIC METABOLIC PANEL
Anion gap: 7 (ref 5–15)
BUN: 70 mg/dL — ABNORMAL HIGH (ref 6–20)
CO2: 28 mmol/L (ref 22–32)
Calcium: 8.5 mg/dL — ABNORMAL LOW (ref 8.9–10.3)
Chloride: 100 mmol/L (ref 98–111)
Creatinine, Ser: 2.2 mg/dL — ABNORMAL HIGH (ref 0.44–1.00)
GFR, Estimated: 27 mL/min — ABNORMAL LOW (ref >60)
Glucose, Bld: 163 mg/dL — ABNORMAL HIGH (ref 70–99)
Potassium: 3.5 mmol/L (ref 3.5–5.1)
Sodium: 135 mmol/L (ref 135–145)

## 2023-08-07 LAB — GLUCOSE, CAPILLARY
Glucose-Capillary: 153 mg/dL — ABNORMAL HIGH (ref 70–99)
Glucose-Capillary: 211 mg/dL — ABNORMAL HIGH (ref 70–99)
Glucose-Capillary: 184 mg/dL — ABNORMAL HIGH (ref 70–99)
Glucose-Capillary: 171 mg/dL — ABNORMAL HIGH (ref 70–99)

## 2023-08-07 LAB — MAGNESIUM: Magnesium: 2.1 mg/dL (ref 1.7–2.4)

## 2023-08-07 MED ORDER — PROCHLORPERAZINE EDISYLATE 10 MG/2ML IJ SOLN
5.0000 mg | Freq: Once | INTRAMUSCULAR | Status: AC
  Administered 2023-08-07: 5 mg via INTRAVENOUS
  Filled 2023-08-07: qty 2

## 2023-08-07 MED ORDER — ONDANSETRON HCL 4 MG/2ML IJ SOLN: 4.0000 mg | Freq: Once | INTRAMUSCULAR | Status: DC

## 2023-08-07 MED ORDER — INSULIN GLARGINE-YFGN 100 UNIT/ML ~~LOC~~ SOLN
10.0000 [IU] | Freq: Every day | SUBCUTANEOUS | Status: DC
  Administered 2023-08-07 – 2023-08-21 (×15): 10 [IU] via SUBCUTANEOUS
  Filled 2023-08-07 (×15): qty 0.1

## 2023-08-07 MED ORDER — POTASSIUM CHLORIDE CRYS ER 20 MEQ PO TBCR
60.0000 meq | EXTENDED_RELEASE_TABLET | Freq: Once | ORAL | Status: AC
  Administered 2023-08-07: 60 meq via ORAL
  Filled 2023-08-07: qty 3

## 2023-08-07 MED ORDER — METOLAZONE 5 MG PO TABS
5.0000 mg | ORAL_TABLET | Freq: Once | ORAL | Status: AC
  Administered 2023-08-07: 5 mg via ORAL
  Filled 2023-08-07: qty 1

## 2023-08-07 NOTE — Progress Notes (Signed)
 PROGRESS NOTE  Karen Bennett FMW:[id number] DOB: [birth year]   PCP: Suanne Pfeiffer, NP  Patient is from: Home.  Uses wheelchair at baseline.  DOA: 08/01/2023 LOS: 6  Chief complaints Chief Complaint  Patient presents with   Leg Swelling     Brief Narrative / Interim history: 48 year old female with anxiety, HTN, hypothyroidism, DM-2, CKD-3B, morbid obesity, PE on Eliquis , history of Candida albicans fungemia, MV endocarditis in 12/2020 and right BKA presented to ED with worsening edema, abdominal bloating, orthopnea and PND.  Per patient, she had 2 weeks of abdominal bloating, BLE swelling.  Initially started on Lasix  without significant improvement.  She was transitioned to p.o. torsemide .  However, continued to have worsening symptoms despite compliance. In the last 2 weeks, she has dyspnea with exertion, at baseline wheelchair-bound and gets short of breath with transfers.    BNP is elevated to 423.  CXR consistent with CHF.  Patient was started on IV Lasix .  TTE with LVEF of 35 to 40%, RWMA, RVSP of 69.5, moderate LAE, severe RAE and moderate TR. Started on IV Lasix .  Cardiology consulted and following.  Plan for [facility] when fluid status improves and weight is under 450 pounds for Cath Lab table.  Subjective: Seen and examined earlier this morning.  No major events overnight of this morning.  No complaints.  She had about 3.8 L urine output/24 hours.  Net -9.2 L.  Weight is only 4 pounds down but not reliable.  Creatinine stable.  Objective: Vitals:   08/06/23 2109 08/07/23 0519 08/07/23 0725 08/07/23 1147  BP: 139/73 122/73  129/71  Pulse: 86 83  80  Resp: 18 20  18   Temp: 97.8 F (36.6 C) 98 F (36.7 C)  97.7 F (36.5 C)  TempSrc: Oral Oral    SpO2: 91% 93%  92%  Weight:   (!) 215.6 kg   Height:        Examination:  GENERAL: No apparent distress.  Nontoxic. HEENT: MMM.  Vision and hearing grossly intact.  NECK: Supple.  Difficult to assess JVD due to body  habitus. RESP:  No IWOB.  Fair aeration bilaterally but limited exam due to body habitus. CVS:  RRR. Heart sounds normal.  ABD/GI/GU: BS+. Abd soft, NTND.  MSK/EXT: Right BKA.  Significant lymphedema bilaterally. SKIN: no apparent skin lesion or wound NEURO: Awake, alert and oriented appropriately.  No apparent focal neuro deficit. PSYCH: Calm. Normal affect.   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  BNP elevated to 523.  CXR with mild CHF.  TTE with LVEF of 35 to 40% (45% in 02/2023), RWMA, RVSP of 69.5, moderate LAE, severe RAE and moderate TR.  Difficult to assess fluid status due to body habitus.  OP 3.8 L / 24 hours.  Net net -9.2 L.  BP stable.  Cr stable. -Cardiology rec: IV Lasix  120 mg BID, and R/LHC when more compensated and Weight < 450 lbs -Strict intake and output, daily weight, renal functions and electrolytes -Fluid restriction to 1500 cc a day  AKI on CKD-3B: AKI improving. Recent Labs    03/21/23 1750 03/22/23 0416 08/01/23 1140 08/01/23 1501 08/02/23 0456 08/03/23 0536 08/04/23 0745 08/05/23 0542 08/06/23 0456 08/07/23 0507  BUN 13 14 67* 56* 66* 68* 68* 67* 65* 70*  CREATININE 1.33* 1.71* 2.06* 2.50* 2.43* 2.18* 2.21* 2.18* 2.06* 2.20*  -Continue monitoring  IDDM-2 with hyperglycemia and HLD: A1c  7.2% on 1/8. Recent Labs  Lab 08/06/23 1211 08/06/23 1741 08/06/23 2106 08/07/23 0740 08/07/23 1144  GLUCAP 139* 116* 140* 153* 211*  -Continue current insulin  regimen -Continue home Lipitor   Hypothyroidism: TSH 4.6. -Continue Synthroid    History of PE, in July 2024 -Continue eliquis    Hx of right BKA (HCC), chronic pain in the right hip: Able to transfer from bed to wheelchair at baseline -Noted, continue oxycodone  as needed -PT/OT  Right flank pain/tenderness: No erythema or signs of infection.  Fat necrosis?  Doubt utility of  ultrasound -Continue Lidoderm  patch  Hypokalemia: Likely due to diuretics -Monitor replenish as appropriate   Morbid obesity- Body mass index is 76.72 kg/m. -Can consider GLP-1 agonist outpatient -Lifestyle change to lose weight  Pressure skin injury: Unclear if present on admission. Pressure Injury 08/07/23 Buttocks Right Stage 2 -  Partial thickness loss of dermis presenting as a shallow open injury with a red, pink wound bed without slough. (Active)  08/07/23 0815  Location: Buttocks  Location Orientation: Right  Staging: Stage 2 -  Partial thickness loss of dermis presenting as a shallow open injury with a red, pink wound bed without slough.  Wound Description (Comments):   Present on Admission:   Dressing Type Foam - Lift dressing to assess site every shift 08/07/23 0815     Pressure Injury 08/07/23 Thigh Anterior;Left (Active)  08/07/23 0815  Location: Thigh  Location Orientation: Anterior;Left  Staging:   Wound Description (Comments):   Present on Admission:   Dressing Type Foam - Lift dressing to assess site every shift 08/07/23 0815   DVT prophylaxis:  SCDs Start: 08/01/23 1400 apixaban  (ELIQUIS ) tablet 5 mg  Code Status: Full code Family Communication: None at bedside. Level of care: Telemetry Status is: Inpatient Remains inpatient appropriate because: Acute on chronic CHF   Final disposition: Likely home Consultants:  Cardiology  35 minutes with more than 50% spent in reviewing records, counseling patient/family and coordinating care.   Sch Meds:  Scheduled Meds:  apixaban   5 mg Oral BID   atorvastatin   40 mg Oral q1800   Gerhardt's butt cream   Topical TID   insulin  aspart  0-15 Units Subcutaneous TID WC   insulin  aspart  0-5 Units Subcutaneous QHS   levothyroxine   75 mcg Oral Q0600   lidocaine   1 patch Transdermal Q24H   Continuous Infusions:  furosemide  120 mg (08/07/23 0923)   PRN Meds:.acetaminophen  **OR** acetaminophen , albuterol , mouth  rinse, oxyCODONE , traZODone   Antimicrobials: Anti-infectives (From admission, onward)    None        I have personally reviewed the following labs and images: CBC: Recent Labs  Lab 08/01/23 1140 08/01/23 1501 08/03/23 0536 08/04/23 0745 08/05/23 0542 08/06/23 0456 08/07/23 0507  WBC 6.3   < > 5.1 5.6 6.7 5.8 5.3  NEUTROABS 4.9  --   --   --   --   --   --   HGB 12.3   < > 11.7* 11.5* 11.6* 11.5* 11.8*  HCT 39.1   < > 38.9 38.1 37.2 35.4* 37.7  MCV 90.5   < > 91.7 92.0 89.2 86.8 88.7  PLT 227   < > 234 203 226 216 224   < > = values in this interval not displayed.   BMP &GFR Recent Labs  Lab 08/03/23 0536 08/04/23 0745 08/05/23 0542 08/06/23 0456 08/07/23 0507  NA 132* 133* 135 135 135  K 4.1 3.7 4.1 3.0* 3.5  CL 102 102 102 101  100  CO2 23 22 24 26 28   GLUCOSE 116* 130* 199* 160* 163*  BUN 68* 68* 67* 65* 70*  CREATININE 2.18* 2.21* 2.18* 2.06* 2.20*  CALCIUM  8.5* 8.5* 8.4* 8.5* 8.5*  MG  --  2.3 2.2 2.1 2.1  PHOS  --   --  5.3* 4.9*  --    Estimated Creatinine Clearance: 61.4 mL/min (A) (by C-G formula based on SCr of 2.2 mg/dL (H)). Liver & Pancreas: Recent Labs  Lab 08/01/23 1140 08/05/23 0542 08/06/23 0456  AST 18  --   --   ALT 11  --   --   ALKPHOS 63  --   --   BILITOT 1.2  --   --   PROT 7.3  --   --   ALBUMIN  2.8* 2.6* 2.5*   No results for input(s): LIPASE, AMYLASE in the last 168 hours. No results for input(s): AMMONIA in the last 168 hours. Diabetic: No results for input(s): HGBA1C in the last 72 hours.  Recent Labs  Lab 08/06/23 1211 08/06/23 1741 08/06/23 2106 08/07/23 0740 08/07/23 1144  GLUCAP 139* 116* 140* 153* 211*   Cardiac Enzymes: No results for input(s): CKTOTAL, CKMB, CKMBINDEX, TROPONINI in the last 168 hours. No results for input(s): PROBNP in the last 8760 hours. Coagulation Profile: No results for input(s): INR, PROTIME in the last 168 hours. Thyroid  Function Tests: No results for  input(s): TSH, T4TOTAL, FREET4, T3FREE, THYROIDAB in the last 72 hours.  Lipid Profile: No results for input(s): CHOL, HDL, LDLCALC, TRIG, CHOLHDL, LDLDIRECT in the last 72 hours. Anemia Panel: No results for input(s): VITAMINB12, FOLATE, FERRITIN, TIBC, IRON , RETICCTPCT in the last 72 hours. Urine analysis:    Component Value Date/Time   COLORURINE AMBER (A) 03/22/2023 0426   APPEARANCEUR TURBID (A) 03/22/2023 0426   LABSPEC 1.040 (H) 03/22/2023 0426   PHURINE 5.0 03/22/2023 0426   GLUCOSEU >=500 (A) 03/22/2023 0426   HGBUR LARGE (A) 03/22/2023 0426   BILIRUBINUR NEGATIVE 03/22/2023 0426   KETONESUR NEGATIVE 03/22/2023 0426   PROTEINUR >=300 (A) 03/22/2023 0426   UROBILINOGEN 0.2 09/05/2011 0558   NITRITE POSITIVE (A) 03/22/2023 0426   LEUKOCYTESUR MODERATE (A) 03/22/2023 0426   Sepsis Labs: Invalid input(s): PROCALCITONIN, LACTICIDVEN  Microbiology: No results found for this or any previous visit (from the past 240 hours).  Radiology Studies: No results found.    Enes Wegener T. Ishani Goldwasser [facility]  If 7PM-7AM, please contact night-coverage [email or web address] 08/07/2023, 11:56 AM

## 2023-08-08 DIAGNOSIS — I5033 Acute on chronic diastolic (congestive) heart failure: Secondary | ICD-10-CM | POA: Diagnosis not present

## 2023-08-08 LAB — CBC
HCT: 37.5 % (ref 36.0–46.0)
Hemoglobin: 11.5 g/dL — ABNORMAL LOW (ref 12.0–15.0)
MCH: 27.4 pg (ref 26.0–34.0)
MCHC: 30.7 g/dL (ref 30.0–36.0)
MCV: 89.3 fL (ref 80.0–100.0)
Platelets: 222 10*3/uL (ref 150–400)
RBC: 4.2 MIL/uL (ref 3.87–5.11)
RDW: 13.8 % (ref 11.5–15.5)
WBC: 5.1 10*3/uL (ref 4.0–10.5)
nRBC: 0 % (ref 0.0–0.2)

## 2023-08-08 LAB — GLUCOSE, CAPILLARY
Glucose-Capillary: 210 mg/dL — ABNORMAL HIGH (ref 70–99)
Glucose-Capillary: 214 mg/dL — ABNORMAL HIGH (ref 70–99)
Glucose-Capillary: 206 mg/dL — ABNORMAL HIGH (ref 70–99)
Glucose-Capillary: 160 mg/dL — ABNORMAL HIGH (ref 70–99)

## 2023-08-08 LAB — BASIC METABOLIC PANEL
Anion gap: 7 (ref 5–15)
BUN: 68 mg/dL — ABNORMAL HIGH (ref 6–20)
CO2: 27 mmol/L (ref 22–32)
Calcium: 8.7 mg/dL — ABNORMAL LOW (ref 8.9–10.3)
Chloride: 101 mmol/L (ref 98–111)
Creatinine, Ser: 2 mg/dL — ABNORMAL HIGH (ref 0.44–1.00)
GFR, Estimated: 31 mL/min — ABNORMAL LOW (ref >60)
Glucose, Bld: 205 mg/dL — ABNORMAL HIGH (ref 70–99)
Potassium: 3.7 mmol/L (ref 3.5–5.1)
Sodium: 135 mmol/L (ref 135–145)

## 2023-08-08 LAB — MAGNESIUM: Magnesium: 2 mg/dL (ref 1.7–2.4)

## 2023-08-08 MED ORDER — POTASSIUM CHLORIDE CRYS ER 20 MEQ PO TBCR
40.0000 meq | EXTENDED_RELEASE_TABLET | Freq: Two times a day (BID) | ORAL | Status: AC
  Administered 2023-08-08 (×2): 40 meq via ORAL
  Filled 2023-08-08 (×2): qty 2

## 2023-08-08 MED ORDER — METOLAZONE 5 MG PO TABS
5.0000 mg | ORAL_TABLET | Freq: Once | ORAL | Status: AC
  Administered 2023-08-08: 5 mg via ORAL
  Filled 2023-08-08: qty 1

## 2023-08-08 MED ORDER — VENLAFAXINE HCL ER 37.5 MG PO CP24
37.5000 mg | ORAL_CAPSULE | Freq: Every day | ORAL | Status: DC
  Administered 2023-08-08 – 2023-08-21 (×14): 37.5 mg via ORAL
  Filled 2023-08-08 (×14): qty 1

## 2023-08-08 MED ORDER — PROCHLORPERAZINE EDISYLATE 10 MG/2ML IJ SOLN
5.0000 mg | Freq: Four times a day (QID) | INTRAMUSCULAR | Status: AC | PRN
  Administered 2023-08-08 – 2023-08-09 (×2): 5 mg via INTRAVENOUS
  Filled 2023-08-08 (×2): qty 2

## 2023-08-08 NOTE — Progress Notes (Signed)
 [facility] Progress Note  Patient: Karen Bennett     ZOX:[id number]  DOA: 08/01/2023   PCP: Lorre Rosin, NP       Brief hospital course: This is a 48 year old female with diabetes, chronic kidney disease, mitral valve endocarditis in 2022, right BKA, hypertension and hypothyroidism who is wheelchair-bound and presented to the hospital with edema, orthopnea, PND and abdominal distention.  She was felt to be volume overloaded and started on diuretics.  Subjective:  She had an episode of nausea and vomiting last night but was able to tolerate breakfast today.   Assessment and Plan: Principal Problem:   Acute on chronic systolic heart failure -Patient has an EF of 35 to 40% -Currently on high-dose IV Lasix  per cardiology -Plan for right and left heart cath after she was adequately diuresed  Active Problems:  Chronic kidney disease - Appears to be at borderline between stage 3-4    Obesity, Class III, BMI 40-49.9 (morbid obesity) (HCC) Body mass index is 75.37 kg/m.      Normocytic anemia   Insulin  dependent type 2 diabetes mellitus (HCC) - cont long and short acting insulin   H/o PE- 7/224 - cont Eliquis  - high risk for PE as she is essentially bedbound         Code Status: Full Code Total time on patient care: 35 min DVT prophylaxis:  SCDs Start: 08/01/23 1400 apixaban  (ELIQUIS ) tablet 5 mg     Objective:   Vitals:   08/07/23 2110 08/08/23 0404 08/08/23 0952 08/08/23 1419  BP: 135/76 (!) 140/77  (!) 143/65  Pulse: 75 75  80  Resp: 16 14    Temp: 98.2 F (36.8 C) 98.2 F (36.8 C)  97.9 F (36.6 C)  TempSrc: Oral Oral  Oral  SpO2: 96% 93%  94%  Weight:   (!) 211.8 kg   Height:       Filed Weights   08/06/23 1240 08/07/23 0725 08/08/23 0952  Weight: (!) 217.4 kg (!) 215.6 kg (!) 211.8 kg   Exam: General exam: Appears comfortable  HEENT: oral mucosa moist Respiratory system: Clear to auscultation.  Cardiovascular system: S1 & S2 heard   Gastrointestinal system: Abdomen soft, non-tender, nondistended. Normal bowel sounds   Extremities: No cyanosis, clubbing - LE edema noted Psychiatry:  Mood & affect appropriate.      CBC: Recent Labs  Lab 08/04/23 0745 08/05/23 0542 08/06/23 0456 08/07/23 0507 08/08/23 0528  WBC 5.6 6.7 5.8 5.3 5.1  HGB 11.5* 11.6* 11.5* 11.8* 11.5*  HCT 38.1 37.2 35.4* 37.7 37.5  MCV 92.0 89.2 86.8 88.7 89.3  PLT 203 226 216 224 222   Basic Metabolic Panel: Recent Labs  Lab 08/04/23 0745 08/05/23 0542 08/06/23 0456 08/07/23 0507 08/08/23 0528  NA 133* 135 135 135 135  K 3.7 4.1 3.0* 3.5 3.7  CL 102 102 101 100 101  CO2 22 24 26 28 27   GLUCOSE 130* 199* 160* 163* 205*  BUN 68* 67* 65* 70* 68*  CREATININE 2.21* 2.18* 2.06* 2.20* 2.00*  CALCIUM  8.5* 8.4* 8.5* 8.5* 8.7*  MG 2.3 2.2 2.1 2.1 2.0  PHOS  --  5.3* 4.9*  --   --      Scheduled Meds:  apixaban   5 mg Oral BID   atorvastatin   40 mg Oral q1800   Gerhardt's butt cream   Topical TID   insulin  aspart  0-15 Units Subcutaneous TID WC   insulin  aspart  0-5 Units Subcutaneous QHS   insulin   glargine-yfgn  10 Units Subcutaneous Daily   levothyroxine   75 mcg Oral Q0600   lidocaine   1 patch Transdermal Q24H   metolazone   5 mg Oral Once   potassium chloride   40 mEq Oral BID   venlafaxine  XR  37.5 mg Oral Daily    Imaging and lab data personally reviewed   Author: Florean Hoobler  08/08/2023 4:27 PM  To contact [facility]>   Check the care team in [facility] and look for the attending/consulting [facility] provider listed  Log into [email or web address] and use [place]'s universal password   Go to> "[facility]"  and find provider  If you still have difficulty reaching the provider, please page the [facility] (Director on Call) for the Hospitalists listed on amion

## 2023-08-08 NOTE — Progress Notes (Signed)
 Rounding Note    Patient Name: Karen Bennett Date of Encounter: 08/08/2023  [facility] Cardiologist: Maudine Sos, MD   Subjective   No acute overnight events. She still has some shortness of breath but feels like it is slowly improving. She has not noticed much improvement in her lower extremity edema yet but weight is down. She feels like she had better urine output with the Metolazone  yesterday. Renal function stable.  Inpatient Medications    Scheduled Meds:  apixaban   5 mg Oral BID   atorvastatin   40 mg Oral q1800   Gerhardt's butt cream   Topical TID   insulin  aspart  0-15 Units Subcutaneous TID WC   insulin  aspart  0-5 Units Subcutaneous QHS   insulin  glargine-yfgn  10 Units Subcutaneous Daily   levothyroxine   75 mcg Oral Q0600   lidocaine   1 patch Transdermal Q24H   venlafaxine  XR  37.5 mg Oral Daily   Continuous Infusions:  furosemide  120 mg (08/08/23 0905)   PRN Meds: acetaminophen  **OR** acetaminophen , albuterol , mouth rinse, oxyCODONE , traZODone    Vital Signs    Vitals:   08/07/23 2000 08/07/23 2110 08/08/23 0404 08/08/23 0952  BP:  135/76 (!) 140/77   Pulse:  75 75   Resp:  16 14   Temp:  98.2 F (36.8 C) 98.2 F (36.8 C)   TempSrc:  Oral Oral   SpO2: 97% 96% 93%   Weight:    (!) 211.8 kg  Height:        Intake/Output Summary (Last 24 hours) at 08/08/2023 1002 Last data filed at 08/08/2023 0900 Gross per 24 hour  Intake 1240 ml  Output 1800 ml  Net -560 ml      08/08/2023    9:52 AM 08/07/2023    7:25 AM 08/06/2023   12:40 PM  Last 3 Weights  Weight (lbs) 466 lb 15 oz 475 lb 5 oz 479 lb 4.5 oz  Weight (kg) 211.8 kg 215.6 kg 217.4 kg      Telemetry    Normal sinus rhythm with occasional PVCs. Rates in the 70s to 80s. - Personally Reviewed  ECG    No new ECG tracing today. - Personally Reviewed  Physical Exam   GEN: Morbidly obese Caucasian female. Resting comfortably in no acute distress.   Neck: JVD difficult to  assess due to body habitus. Cardiac: RRR. No murmurs, rubs, or gallops.  Respiratory: No increased work of breathing. Clear to auscultation bilaterally. No wheeze, rhonchi, or rales. MS: Bilateral lower extremity edema (possible lymphedema). S/p right below knee amputation.  Neuro:  No focal deficits.  Psych: Normal affect. Responds appropriately.   Labs    High Sensitivity Troponin:   Recent Labs  Lab 08/01/23 1140 08/01/23 1414  TROPONINIHS 33* 36*     Chemistry Recent Labs  Lab 08/01/23 1140 08/01/23 1501 08/05/23 0542 08/06/23 0456 08/07/23 0507 08/08/23 0528  NA 132*   < > 135 135 135 135  K 5.1   < > 4.1 3.0* 3.5 3.7  CL 104   < > 102 101 100 101  CO2 21*   < > 24 26 28 27   GLUCOSE 151*   < > 199* 160* 163* 205*  BUN 67*   < > 67* 65* 70* 68*  CREATININE 2.06*   < > 2.18* 2.06* 2.20* 2.00*  CALCIUM  8.5*   < > 8.4* 8.5* 8.5* 8.7*  MG  --    < > 2.2 2.1 2.1 2.0  PROT 7.3  --   --   --   --   --   ALBUMIN  2.8*  --  2.6* 2.5*  --   --   AST 18  --   --   --   --   --   ALT 11  --   --   --   --   --   ALKPHOS 63  --   --   --   --   --   BILITOT 1.2  --   --   --   --   --   GFRNONAA 30*   < > 28* 30* 27* 31*  ANIONGAP 7   < > 9 8 7 7    < > = values in this interval not displayed.    Lipids No results for input(s): "CHOL", "TRIG", "HDL", "LABVLDL", "LDLCALC", "CHOLHDL" in the last 168 hours.  Hematology Recent Labs  Lab 08/06/23 0456 08/07/23 0507 08/08/23 0528  WBC 5.8 5.3 5.1  RBC 4.08 4.25 4.20  HGB 11.5* 11.8* 11.5*  HCT 35.4* 37.7 37.5  MCV 86.8 88.7 89.3  MCH 28.2 27.8 27.4  MCHC 32.5 31.3 30.7  RDW 13.7 13.7 13.8  PLT 216 224 222   Thyroid   Recent Labs  Lab 08/04/23 0745  TSH 4.613*    BNP Recent Labs  Lab 08/01/23 1140  BNP 523.3*    DDimer No results for input(s): "DDIMER" in the last 168 hours.   Radiology    No results found.  Cardiac Studies   Echocardiogram 08/02/2023: Impressions: 1. No apical thrombus with Definity .  Left ventricular ejection fraction,  by estimation, is 35 to 40%. Left ventricular ejection fraction by 2D MOD  biplane is 40.8 %. The left ventricle has moderately decreased function.  The left ventricle demonstrates  regional wall motion abnormalities (see scoring diagram/findings for  description). The left ventricular internal cavity size was mildly  dilated. Left ventricular diastolic parameters are consistent with Grade  III diastolic dysfunction (restrictive).  Elevated left ventricular end-diastolic pressure. There is abnormal  (paradoxical) septal motion, consistent with right ventricular volume  overload. There is severe akinesis of the left ventricular, mid inferior  wall and inferolateral wall.   2. Right ventricular systolic function is low normal. The right  ventricular size is normal. There is severely elevated pulmonary artery  systolic pressure. The estimated right ventricular systolic pressure is  69.5 mmHg.   3. Left atrial size was moderately dilated.   4. Right atrial size was severely dilated.   5. The mitral valve is abnormal. Mild mitral valve regurgitation.   6. The tricuspid valve is abnormal. Tricuspid valve regurgitation is  moderate.   7. The aortic valve is tricuspid. Aortic valve regurgitation is not  visualized. No aortic stenosis is present.   8. The inferior vena cava is dilated in size with <50% respiratory  variability, suggesting right atrial pressure of 15 mmHg.   Patient Profile     48 y.o. 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   Assessment & Plan    Acute on Chronic Combined CHF Severe  Pulmonary Hypertension Patient presented with worsening shortness of breath and edema. BNP 523. Chest x-ray was suggestive of mild CHF. Echo showed LVEF of 35-40% with severe akinesis of the mid inferior wall and inferolateral wall as well as grade 3 diastolic dysfunction and severely elevated PASP. She has been diuresed with high dose IV Lasix  and intermittent Metolazone  (last dose 1/14). Documented urinary output of 2.15 yesterday and net negative 9.9 L this admission. Weight 466 lbs today (down 9 lbs from yesterday). Renal function stable. - Volume status difficult to assess due to morbid obesity. However, patient states she has had significant weigh gain since 04/2023. She states she weighed 320 lbs at that time and admission weight was 479 lbs. - Continue IV Lasix  120mg  twice daily.   - Will give another dose of Metolazone  5mg  prior to evening dose of Lasix . Will give additional KCl in anticipation of this. - No ACEi/ ARB/ ARNI or MRA given renal function. - She has been on Jardiance at home but she has a history of fungemia and I have concerns that she is at increased risk for infection with her super morbid obesity (BMI 77). Continue to hold this for now. - Continue to monitor daily weights, strict I/Os, and renal function.  - Chest CTA on admission showed severe coronary calcifications ane Echo shows declining EF with regional wall motion abnormalities so need to rule out obstructive CAD. Pulmonary hypertension likely due to severe obesity with suspect OHS/ OSA. Will need outpatient sleep study. Plan is for [facility] after she has been adequately diuresed if renal function will allow.  Cath lab table weight limit is 450 lbs    Demand Ischemia High-sensitivity troponin minimally elevated and flat at 33 >> 36. Echo shows LVEF of 30-35% with regional wall motion abnormalities as above. - No chest pain.  - Troponin  elevation consistent with demand ischemia due to acute CHF rather than ACS. However, plan is for R/ LHC when adequately diuresed as above.    History of PE/ DVT On chronic anticoagulation at home.  - Continue Eliquis  5mg  twice daily. Will need for 2 days prior to cath but still a ways off from this right now.    Hypertension BP mostly well controlled.  - Continue diuresis as above.   CKD Stage IIIb Creatinine 2.06 on admission. Prior baseline 1.3 to 1.9 in 02/2023.  - Creatinine stable at 2.0 today. - See recommendations regarding diuresis as above.   Otherwise, per primary team: - Type 2 diabetes mellitus - Hyperlipidemia - Hypothyroidism  - S/p right BKA - Chronic pain - Super morbid obesity  For questions or updates, please contact [facility] Please consult [email or web address] for contact info under        Signed, Aliscia Clayton E Simrin Vegh, PA-C  08/08/2023, 10:02 AM

## 2023-08-08 NOTE — Inpatient Diabetes Management (Signed)
 Inpatient Diabetes Program Recommendations  AACE/ADA: New Consensus Statement on Inpatient Glycemic Control (2015)  Target Ranges:  Prepandial:   less than 140 mg/dL      Peak postprandial:   less than 180 mg/dL (1-2 hours)      Critically ill patients:  140 - 180 mg/dL   Lab Results  Component Value Date   GLUCAP 214 (H) 08/08/2023   HGBA1C 7.2 (H) 08/01/2023    Review of Glycemic Control  Diabetes history: DM2 Outpatient Diabetes medications: Lantus  10 at bedtime, Humalog  1-6 TID, Mounjaro 2.5 mg weekly, Jardiance 25 daily. Has FS Libre CGM Current orders for Inpatient glycemic control: Lantus  10 daily, Novolog  0-15 TID with meals and 0-5 HS  HgbA1C - 7.2% Eating 100%  Inpatient Diabetes Program Recommendations:    Consider adding Novolog  3 units TID with meals if eating > 50%.  Continue to follow glucose trends.  Thank you. Joni Net, RD, LDN, CDCES Inpatient Diabetes Coordinator [phone]

## 2023-08-08 NOTE — Plan of Care (Signed)
  Problem: Education: Goal: Individualized Educational Video(s) Outcome: Progressing   Problem: Health Behavior/Discharge Planning: Goal: Ability to identify and utilize available resources and services will improve Outcome: Progressing Goal: Ability to manage health-related needs will improve Outcome: Progressing   Problem: Metabolic: Goal: Ability to maintain appropriate glucose levels will improve Outcome: Progressing

## 2023-08-08 NOTE — Plan of Care (Signed)
  Problem: Education: Goal: Ability to describe self-care measures that may prevent or decrease complications (Diabetes Survival Skills Education) will improve Outcome: Completed/Met   Problem: Coping: Goal: Ability to adjust to condition or change in health will improve Outcome: Completed/Met   Problem: Fluid Volume: Goal: Ability to maintain a balanced intake and output will improve Outcome: Completed/Met

## 2023-08-09 DIAGNOSIS — N1831 Chronic kidney disease, stage 3a: Secondary | ICD-10-CM | POA: Diagnosis not present

## 2023-08-09 DIAGNOSIS — I5033 Acute on chronic diastolic (congestive) heart failure: Secondary | ICD-10-CM | POA: Diagnosis not present

## 2023-08-09 LAB — BASIC METABOLIC PANEL
Anion gap: 10 (ref 5–15)
BUN: 74 mg/dL — ABNORMAL HIGH (ref 6–20)
CO2: 25 mmol/L (ref 22–32)
Calcium: 8.8 mg/dL — ABNORMAL LOW (ref 8.9–10.3)
Chloride: 100 mmol/L (ref 98–111)
Creatinine, Ser: 2.02 mg/dL — ABNORMAL HIGH (ref 0.44–1.00)
GFR, Estimated: 30 mL/min — ABNORMAL LOW (ref >60)
Glucose, Bld: 146 mg/dL — ABNORMAL HIGH (ref 70–99)
Potassium: 4 mmol/L (ref 3.5–5.1)
Sodium: 135 mmol/L (ref 135–145)

## 2023-08-09 LAB — GLUCOSE, CAPILLARY
Glucose-Capillary: 144 mg/dL — ABNORMAL HIGH (ref 70–99)
Glucose-Capillary: 160 mg/dL — ABNORMAL HIGH (ref 70–99)
Glucose-Capillary: 194 mg/dL — ABNORMAL HIGH (ref 70–99)
Glucose-Capillary: 169 mg/dL — ABNORMAL HIGH (ref 70–99)

## 2023-08-09 MED ORDER — DOCUSATE SODIUM 100 MG PO CAPS
100.0000 mg | ORAL_CAPSULE | Freq: Every day | ORAL | Status: DC | PRN
  Administered 2023-08-10: 100 mg via ORAL
  Filled 2023-08-09: qty 1

## 2023-08-09 MED ORDER — FAMOTIDINE IN NACL 20-0.9 MG/50ML-% IV SOLN
20.0000 mg | Freq: Two times a day (BID) | INTRAVENOUS | Status: DC
Start: 2023-08-09 — End: 2023-08-13
  Administered 2023-08-09 – 2023-08-12 (×8): 20 mg via INTRAVENOUS
  Filled 2023-08-09 (×8): qty 50

## 2023-08-09 MED ORDER — METOPROLOL TARTRATE 25 MG PO TABS
25.0000 mg | ORAL_TABLET | Freq: Two times a day (BID) | ORAL | Status: DC
  Administered 2023-08-09: 25 mg via ORAL
  Filled 2023-08-09 (×2): qty 1

## 2023-08-09 NOTE — Progress Notes (Addendum)
[facility] Progress Note  Patient: Karen Bennett     QIH:[id number]  DOA: 08/01/2023   PCP: [name], NP       Brief hospital course: This is a 48 year old female with diabetes, chronic kidney disease, mitral valve endocarditis in 2022, right BKA, hypertension and hypothyroidism who is wheelchair-bound and presented to the hospital with edema, orthopnea, PND and abdominal distention.  She was felt to be volume overloaded and started on diuretics.  Subjective:  Nauseated again today. Vomited again last night. Normal BMs. No abdominal pain.   Assessment and Plan: Principal Problem:   Acute on chronic systolic heart failure -Patient has an EF of 35 to 40% -Currently on high-dose IV Lasix per cardiology -Plan for right and left heart cath after she was adequately diuresed  Active Problems:  Nausea - occasional vomiting - benign abdominal exam- not constipated - start Pepcid  NSVT  - cardiology starting Lopressor today  Chronic kidney disease - Appears to be at between stage 3-4    Obesity, Class III, BMI 40-49.9 (morbid obesity) (HCC) Body mass index is 76.11 kg/m.    Normocytic anemia   Insulin dependent type 2 diabetes mellitus (HCC) - cont long and short acting insulin  H/o PE- 7/224 - cont Eliquis - high risk for PE as she is essentially bedbound         Code Status: Full Code Total time on patient care: 35 min DVT prophylaxis:  SCDs Start: 08/01/23 1400 apixaban (ELIQUIS) tablet 5 mg     Objective:   Vitals:   08/08/23 2101 08/09/23 0457 08/09/23 0541 08/09/23 1141  BP: (!) 129/58 132/69  113/69  Pulse: 65 72  69  Resp: 20 18    Temp: 97.8 F (36.6 C) 98 F (36.7 C)  97.6 F (36.4 C)  TempSrc:  Oral  Axillary  SpO2: 91% 92%  92%  Weight:   (!) 213.9 kg   Height:       Filed Weights   08/07/23 0725 08/08/23 0952 08/09/23 0541  Weight: (!) 215.6 kg (!) 211.8 kg (!) 213.9 kg   Exam: General exam: Appears comfortable  HEENT:  oral mucosa moist Respiratory system: Clear to auscultation.  Cardiovascular system: S1 & S2 heard  Gastrointestinal system: Abdomen soft, non-tender, nondistended. Normal bowel sounds   Extremities: No cyanosis, clubbing - edema in thighs noted Psychiatry:  Mood & affect appropriate.      CBC: Recent Labs  Lab 08/04/23 0745 08/05/23 0542 08/06/23 0456 08/07/23 0507 08/08/23 0528  WBC 5.6 6.7 5.8 5.3 5.1  HGB 11.5* 11.6* 11.5* 11.8* 11.5*  HCT 38.1 37.2 35.4* 37.7 37.5  MCV 92.0 89.2 86.8 88.7 89.3  PLT 203 226 216 224 222   Basic Metabolic Panel: Recent Labs  Lab 08/04/23 0745 08/05/23 0542 08/06/23 0456 08/07/23 0507 08/08/23 0528 08/09/23 0515  NA 133* 135 135 135 135 135  K 3.7 4.1 3.0* 3.5 3.7 4.0  CL 102 102 101 100 101 100  CO2 22 24 26 28 27 25   GLUCOSE 130* 199* 160* 163* 205* 146*  BUN 68* 67* 65* 70* 68* 74*  CREATININE 2.21* 2.18* 2.06* 2.20* 2.00* 2.02*  CALCIUM 8.5* 8.4* 8.5* 8.5* 8.7* 8.8*  MG 2.3 2.2 2.1 2.1 2.0  --   PHOS  --  5.3* 4.9*  --   --   --      Scheduled Meds:  apixaban  5 mg Oral BID   atorvastatin  40 mg Oral q1800  Gerhardt's butt cream   Topical TID   insulin aspart  0-15 Units Subcutaneous TID WC   insulin aspart  0-5 Units Subcutaneous QHS   insulin glargine-yfgn  10 Units Subcutaneous Daily   levothyroxine  75 mcg Oral Q0600   lidocaine  1 patch Transdermal Q24H   metoprolol tartrate  25 mg Oral BID   venlafaxine XR  37.5 mg Oral Daily    Imaging and lab data personally reviewed   Author: Calvert Cantor  08/09/2023 5:55 PM  To contact [facility]>   Check the care team in [facility] and look for the attending/consulting [facility] provider listed  Log into [email or web address] and use [facility] universal password   Go to> "[facility]"  and find provider  If you still have difficulty reaching the provider, please page the [facility] (Director on Call) for the Hospitalists listed on amion

## 2023-08-09 NOTE — Progress Notes (Signed)
Rounding Note    Patient Name: Karen Bennett Date of Encounter: 08/09/2023  [facility] Cardiologist: Chilton Si, MD   Subjective   No overnight symptoms. Noted to have PVC's and NSVT overnight- LVEF 35-40% with regional [facility] - concern for possible ischemia, but not c/o chest pain. Weight still above cath table limits.  About net even recorded with diuretics-  overall 10L negative. Creatinine stable at 2.02. Potassium and magnesium have been normal.  Inpatient Medications    Scheduled Meds:  apixaban  5 mg Oral BID   atorvastatin  40 mg Oral q1800   Gerhardt's butt cream   Topical TID   insulin aspart  0-15 Units Subcutaneous TID WC   insulin aspart  0-5 Units Subcutaneous QHS   insulin glargine-yfgn  10 Units Subcutaneous Daily   levothyroxine  75 mcg Oral Q0600   lidocaine  1 patch Transdermal Q24H   venlafaxine XR  37.5 mg Oral Daily   Continuous Infusions:  furosemide 120 mg (08/09/23 0946)   PRN Meds: acetaminophen **OR** acetaminophen, albuterol, mouth rinse, oxyCODONE, prochlorperazine, traZODone   Vital Signs    Vitals:   08/08/23 1419 08/08/23 2101 08/09/23 0457 08/09/23 0541  BP: (!) 143/65 (!) 129/58 132/69   Pulse: 80 65 72   Resp:  20 18   Temp: 97.9 F (36.6 C) 97.8 F (36.6 C) 98 F (36.7 C)   TempSrc: Oral  Oral   SpO2: 94% 91% 92%   Weight:    (!) 213.9 kg  Height:        Intake/Output Summary (Last 24 hours) at 08/09/2023 1021 Last data filed at 08/09/2023 0548 Gross per 24 hour  Intake 1350 ml  Output 1475 ml  Net -125 ml      08/09/2023    5:41 AM 08/08/2023    9:52 AM 08/07/2023    7:25 AM  Last 3 Weights  Weight (lbs) 471 lb 9 oz 466 lb 15 oz 475 lb 5 oz  Weight (kg) 213.9 kg 211.8 kg 215.6 kg      Telemetry    Sinus rhythm with PVC's and short runs of NSVT overnight - personally reviewed  ECG    12 lead EKG ordered  Physical Exam   GEN: Karen Bennett. Resting comfortably in no acute  distress.   Neck: JVD difficult to assess due to body habitus. Cardiac: RRR. No murmurs, rubs, or gallops.  Respiratory: No increased work of breathing. Clear to auscultation bilaterally. No wheeze, rhonchi, or rales. MS: Bilateral lower extremity edema (possible lymphedema). S/p right below knee amputation.  Neuro:  No focal deficits.  Psych: Normal affect. Responds appropriately.   Labs    High Sensitivity Troponin:   Recent Labs  Lab 08/01/23 1140 08/01/23 1414  TROPONINIHS 33* 36*     Chemistry Recent Labs  Lab 08/05/23 0542 08/06/23 0456 08/07/23 0507 08/08/23 0528 08/09/23 0515  NA 135 135 135 135 135  K 4.1 3.0* 3.5 3.7 4.0  CL 102 101 100 101 100  CO2 24 26 28 27 25   GLUCOSE 199* 160* 163* 205* 146*  BUN 67* 65* 70* 68* 74*  CREATININE 2.18* 2.06* 2.20* 2.00* 2.02*  CALCIUM 8.4* 8.5* 8.5* 8.7* 8.8*  MG 2.2 2.1 2.1 2.0  --   ALBUMIN 2.6* 2.5*  --   --   --   GFRNONAA 28* 30* 27* 31* 30*  ANIONGAP 9 8 7 7 10     Lipids No results for input(s): "CHOL", "  TRIG", "HDL", "LABVLDL", "LDLCALC", "CHOLHDL" in the last 168 hours.  Hematology Recent Labs  Lab 08/06/23 0456 08/07/23 0507 08/08/23 0528  WBC 5.8 5.3 5.1  RBC 4.08 4.25 4.20  HGB 11.5* 11.8* 11.5*  HCT 35.4* 37.7 37.5  MCV 86.8 88.7 89.3  MCH 28.2 27.8 27.4  MCHC 32.5 31.3 30.7  RDW 13.7 13.7 13.8  PLT 216 224 222   Thyroid  Recent Labs  Lab 08/04/23 0745  TSH 4.613*    BNP No results for input(s): "BNP", "PROBNP" in the last 168 hours.   DDimer No results for input(s): "DDIMER" in the last 168 hours.   Radiology    No results found.  Cardiac Studies   Echocardiogram 08/02/2023: Impressions: 1. No apical thrombus with Definity. Left ventricular ejection fraction,  by estimation, is 35 to 40%. Left ventricular ejection fraction by 2D MOD  biplane is 40.8 %. The left ventricle has moderately decreased function.  The left ventricle demonstrates  regional wall motion abnormalities (see  scoring diagram/findings for  description). The left ventricular internal cavity size was mildly  dilated. Left ventricular diastolic parameters are consistent with Grade  III diastolic dysfunction (restrictive).  Elevated left ventricular end-diastolic pressure. There is abnormal  (paradoxical) septal motion, consistent with right ventricular volume  overload. There is severe akinesis of the left ventricular, mid inferior  wall and inferolateral wall.   2. Right ventricular systolic function is low normal. The right  ventricular size is normal. There is severely elevated pulmonary artery  systolic pressure. The estimated right ventricular systolic pressure is  69.5 mmHg.   3. Left atrial size was moderately dilated.   4. Right atrial size was severely dilated.   5. The mitral valve is abnormal. Mild mitral valve regurgitation.   6. The tricuspid valve is abnormal. Tricuspid valve regurgitation is  moderate.   7. The aortic valve is tricuspid. Aortic valve regurgitation is not  visualized. No aortic stenosis is present.   8. The inferior vena cava is dilated in size with <50% respiratory  variability, suggesting right atrial pressure of 15 mmHg.   Patient Profile     48 y.o. 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   Assessment & Plan    Acute on Chronic Combined CHF Severe Pulmonary Hypertension Patient presented with worsening shortness of breath and edema. BNP 523. Chest x-ray was suggestive of mild CHF. Echo showed LVEF of 35-40% with severe akinesis  of the mid inferior wall and inferolateral wall as well as grade 3 diastolic dysfunction and severely elevated PASP. She has been diuresed with high dose IV Lasix and intermittent Metolazone (last dose 1/14). Documented urinary output of 2.15 yesterday and net negative 9.9 L this admission. Weight 466 lbs today (down 9 lbs from yesterday). Renal function stable. - Volume status difficult to assess due to morbid obesity. However, patient states she has had significant weigh gain since 04/2023. She states she weighed 320 lbs at that time and admission weight was 479 lbs. - Continue IV Lasix 120mg  twice daily, will not give additional metolazone- she may be at end-diuresis.   -  Continue to monitor daily weights, strict I/Os, and renal function.  - Renal function will not allow further titration of GDMT - not considered an SGLT2i candidate. - Chest CTA on admission showed severe coronary calcifications ane Echo shows declining EF with regional wall motion abnormalities so need to rule out obstructive CAD. Pulmonary hypertension likely due to severe obesity with suspect OHS/ OSA. Will need outpatient sleep study. Plan is for [facility] after she has been adequately diuresed if renal function will allow.  Cath lab table weight limit is 450 lbs   NSVT/PVCs Noted overnight- not symptomatic, denies chest pain -Start metoprolol tartrate 25 mg BID today   Demand Ischemia High-sensitivity troponin minimally elevated and flat at 33 >> 36. Echo shows LVEF of 35-40% with regional wall motion abnormalities as above. - No chest pain. - PVC's and NSVT noted  - Troponin elevation consistent with demand ischemia due to acute CHF rather than ACS. However, plan is for R/ LHC when adequately diuresed as above.    History of PE/ DVT On chronic anticoagulation at home.  - Continue Eliquis 5mg  twice daily. Will need for 2 days prior to cath but still a ways off from this right now.    Hypertension BP mostly well  controlled.  - Continue diuresis as above.   CKD Stage IIIb Creatinine 2.06 on admission. Prior baseline 1.3 to 1.9 in 02/2023.  - Creatinine stable at 2.0 today. - See recommendations regarding diuresis as above.   Otherwise, per primary team: - Type 2 diabetes mellitus - Hyperlipidemia - Hypothyroidism  - S/p right BKA - Chronic pain - Super morbid obesity  For questions or updates, please contact [facility] Please consult [email or web address] for contact info under   Chrystie Nose, MD, Milagros Loll  [place]  [facility]  Medical Director of the [facility] Diplomate of the American Board of Clinical Lipidology Attending Cardiologist  Direct Dial: [phone]  Fax: [phone]  Website:  [email or web address]  Chrystie Nose, MD  08/09/2023, 10:21 AM

## 2023-08-09 NOTE — Plan of Care (Signed)
  Problem: Health Behavior/Discharge Planning: Goal: Ability to identify and utilize available resources and services will improve Outcome: Progressing Goal: Ability to manage health-related needs will improve Outcome: Progressing   Problem: Metabolic: Goal: Ability to maintain appropriate glucose levels will improve Outcome: Progressing

## 2023-08-10 ENCOUNTER — Inpatient Hospital Stay (HOSPITAL_COMMUNITY): Payer: 59

## 2023-08-10 ENCOUNTER — Other Ambulatory Visit: Payer: Self-pay

## 2023-08-10 DIAGNOSIS — I5033 Acute on chronic diastolic (congestive) heart failure: Secondary | ICD-10-CM | POA: Diagnosis not present

## 2023-08-10 DIAGNOSIS — N179 Acute kidney failure, unspecified: Secondary | ICD-10-CM | POA: Diagnosis not present

## 2023-08-10 HISTORY — PX: IR US GUIDE VASC ACCESS RIGHT: IMG2390

## 2023-08-10 LAB — BASIC METABOLIC PANEL
Anion gap: 12 (ref 5–15)
Anion gap: 12 (ref 5–15)
BUN: 76 mg/dL — ABNORMAL HIGH (ref 6–20)
BUN: 84 mg/dL — ABNORMAL HIGH (ref 6–20)
CO2: 21 mmol/L — ABNORMAL LOW (ref 22–32)
CO2: 26 mmol/L (ref 22–32)
Calcium: 8.8 mg/dL — ABNORMAL LOW (ref 8.9–10.3)
Calcium: 8.5 mg/dL — ABNORMAL LOW (ref 8.9–10.3)
Chloride: 101 mmol/L (ref 98–111)
Chloride: 96 mmol/L — ABNORMAL LOW (ref 98–111)
Creatinine, Ser: 2.51 mg/dL — ABNORMAL HIGH (ref 0.44–1.00)
Creatinine, Ser: 2.74 mg/dL — ABNORMAL HIGH (ref 0.44–1.00)
GFR, Estimated: 23 mL/min — ABNORMAL LOW (ref >60)
GFR, Estimated: 21 mL/min — ABNORMAL LOW (ref >60)
Glucose, Bld: 134 mg/dL — ABNORMAL HIGH (ref 70–99)
Glucose, Bld: 167 mg/dL — ABNORMAL HIGH (ref 70–99)
Potassium: 4.7 mmol/L (ref 3.5–5.1)
Potassium: 4.2 mmol/L (ref 3.5–5.1)
Sodium: 134 mmol/L — ABNORMAL LOW (ref 135–145)
Sodium: 134 mmol/L — ABNORMAL LOW (ref 135–145)

## 2023-08-10 LAB — GLUCOSE, CAPILLARY
Glucose-Capillary: 125 mg/dL — ABNORMAL HIGH (ref 70–99)
Glucose-Capillary: 131 mg/dL — ABNORMAL HIGH (ref 70–99)
Glucose-Capillary: 123 mg/dL — ABNORMAL HIGH (ref 70–99)
Glucose-Capillary: 168 mg/dL — ABNORMAL HIGH (ref 70–99)

## 2023-08-10 LAB — MRSA NEXT GEN BY PCR, NASAL: MRSA by PCR Next Gen: DETECTED — AB

## 2023-08-10 LAB — COOXEMETRY PANEL
Carboxyhemoglobin: 1.2 % (ref 0.5–1.5)
Methemoglobin: <0.7 % (ref 0.0–1.5)
O2 Saturation: 47.7 %
Total hemoglobin: 11.9 g/dL — ABNORMAL LOW (ref 12.0–16.0)

## 2023-08-10 MED ORDER — ALPRAZOLAM 0.25 MG PO TABS
0.2500 mg | ORAL_TABLET | Freq: Once | ORAL | Status: AC
  Administered 2023-08-10: 0.25 mg via ORAL
  Filled 2023-08-10: qty 1

## 2023-08-10 MED ORDER — CHLORHEXIDINE GLUCONATE CLOTH 2 % EX PADS
6.0000 | MEDICATED_PAD | Freq: Every day | CUTANEOUS | Status: DC
  Administered 2023-08-10 – 2023-08-21 (×12): 6 via TOPICAL

## 2023-08-10 MED ORDER — PROMETHAZINE HCL 25 MG PO TABS
25.0000 mg | ORAL_TABLET | Freq: Two times a day (BID) | ORAL | Status: DC
  Administered 2023-08-10 – 2023-08-21 (×21): 25 mg via ORAL
  Filled 2023-08-10 (×22): qty 1

## 2023-08-10 MED ORDER — ORAL CARE MOUTH RINSE: 15.0000 mL | OROMUCOSAL | Status: DC | PRN

## 2023-08-10 MED ORDER — PROCHLORPERAZINE EDISYLATE 10 MG/2ML IJ SOLN
5.0000 mg | Freq: Four times a day (QID) | INTRAMUSCULAR | Status: DC | PRN
  Administered 2023-08-10 (×2): 5 mg via INTRAVENOUS
  Filled 2023-08-10 (×2): qty 2

## 2023-08-10 MED ORDER — LIDOCAINE-EPINEPHRINE 1 %-1:100000 IJ SOLN
20.0000 mL | Freq: Once | INTRAMUSCULAR | Status: AC
  Administered 2023-08-10: 7 mL via INTRADERMAL

## 2023-08-10 MED ORDER — OXYCODONE HCL 5 MG PO TABS
10.0000 mg | ORAL_TABLET | Freq: Three times a day (TID) | ORAL | Status: DC | PRN
  Administered 2023-08-10 – 2023-08-15 (×12): 10 mg via ORAL
  Filled 2023-08-10 (×12): qty 2

## 2023-08-10 MED ORDER — LIDOCAINE-EPINEPHRINE 1 %-1:100000 IJ SOLN
INTRAMUSCULAR | Status: AC
  Filled 2023-08-10: qty 1

## 2023-08-10 NOTE — Progress Notes (Addendum)
Dr. Rennis Golden d/w Dr. Shirlee Latch and Dr. Francine Graven. Dr. Shirlee Latch felt patient was poor candidate for [facility] with current body habitus, that PICC to obtain CVP + Co-Ox would be more helpful. Dr. Shirlee Latch did not think the PCCM service needed to be involved unless they already were, that she could go to stepdown at [facility] [place] to keep on medicine service. I ordered the triple lumen PICC per Dr. Alford Highland recommendations along with CVP qshift and daily co-ox. Care order written to notify me when these have resulted and I will review with Dr. Shirlee Latch. PCCM, primary attending updated.

## 2023-08-10 NOTE — Plan of Care (Signed)
  Problem: Education: Goal: Individualized Educational Video(s) Outcome: Progressing   Problem: Health Behavior/Discharge Planning: Goal: Ability to identify and utilize available resources and services will improve Outcome: Progressing Goal: Ability to manage health-related needs will improve Outcome: Progressing   Problem: Metabolic: Goal: Ability to maintain appropriate glucose levels will improve Outcome: Progressing   Problem: Nutritional: Goal: Maintenance of adequate nutrition will improve Outcome: Progressing Goal: Progress toward achieving an optimal weight will improve Outcome: Progressing   Problem: Skin Integrity: Goal: Risk for impaired skin integrity will decrease Outcome: Progressing   Problem: Tissue Perfusion: Goal: Adequacy of tissue perfusion will improve Outcome: Progressing   Problem: Education: Goal: Knowledge of General Education information will improve Description: Including pain rating scale, medication(s)/side effects and non-pharmacologic comfort measures Outcome: Progressing   Problem: Health Behavior/Discharge Planning: Goal: Ability to manage health-related needs will improve Outcome: Progressing   Problem: Clinical Measurements: Goal: Ability to maintain clinical measurements within normal limits will improve Outcome: Progressing Goal: Will remain free from infection Outcome: Progressing Goal: Diagnostic test results will improve Outcome: Progressing Goal: Respiratory complications will improve Outcome: Progressing Goal: Cardiovascular complication will be avoided Outcome: Progressing   Problem: Activity: Goal: Risk for activity intolerance will decrease Outcome: Progressing   Problem: Nutrition: Goal: Adequate nutrition will be maintained Outcome: Progressing   Problem: Coping: Goal: Level of anxiety will decrease Outcome: Progressing   Problem: Elimination: Goal: Will not experience complications related to bowel  motility Outcome: Progressing Goal: Will not experience complications related to urinary retention Outcome: Progressing   Problem: Pain Management: Goal: General experience of comfort will improve Outcome: Progressing   Problem: Safety: Goal: Ability to remain free from injury will improve Outcome: Progressing   Problem: Skin Integrity: Goal: Risk for impaired skin integrity will decrease Outcome: Progressing

## 2023-08-10 NOTE — Progress Notes (Addendum)
Relayed PICC team concerns about h/o right subclavian stenosis to Dr. Shirlee Latch who reviewed with Dr. Francine Graven. They recommended trial of left sided PICC. IV team relayed concerns about attempting and missing window for IR before they are gone for the day. Per update with Dr. Shirlee Latch, I consulted IR. In the interim I received update that IV team did attempt PICC on the left but were unsuccessful. D. Allred with IR reached out to clarify if we want nontunneled internal jugular PICC. I talked with Dr. Shirlee Latch who said he fine with any kind of central access, that a nontunneled internal jugular PICC is fine, she is not anticipated to go home with this. D. Allred is assisting with placing order. Appreciate the coordination of care with IR team.   Addendum: co-ox 47.7. CVP was available after 8pm, measured at 15. D/w on-call cardiologist Dr. Servando Salina who recommends patient be transferred to [facility] to consider initiation of milrinone in AM. She did not think this needed to be started acutely overnight. I tried to page [facility] swing and APP coverage and did not yet hear back so sent secure chat linking the [facility] overnight covering medicine providers with Dr. Orson Aloe, our overnight covering cardiology physician as well to make sure this gets communicated. Dr. Orson Aloe, Dr. Servando Salina and I verbally discussed her case as well. Dr. Servando Salina recommends repeat BMET now to guide re-challenge with Lasix, asked Dr. Orson Aloe to follow up on result when available.  Also sent msg to [employer] inbox/weekend APP team to have CHF see patient at [facility] tomorrow.

## 2023-08-10 NOTE — Progress Notes (Addendum)
Progress Note  Patient Name: Karen Bennett Date of Encounter: 08/10/2023  Primary Cardiologist: Chilton Si, MD  Subjective   Doesn't feel well today, feels nauseated. Still feels swollen. Volume status difficult to discern with severe obesity. HR 50s this AM, precludes metoprolol continuation for now.  Inpatient Medications    Scheduled Meds:  apixaban  5 mg Oral BID   atorvastatin  40 mg Oral q1800   Gerhardt's butt cream   Topical TID   insulin aspart  0-15 Units Subcutaneous TID WC   insulin aspart  0-5 Units Subcutaneous QHS   insulin glargine-yfgn  10 Units Subcutaneous Daily   levothyroxine  75 mcg Oral Q0600   lidocaine  1 patch Transdermal Q24H   metoprolol tartrate  25 mg Oral BID   promethazine  25 mg Oral BID AC   venlafaxine XR  37.5 mg Oral Daily   Continuous Infusions:  famotidine (PEPCID) IV 20 mg (08/10/23 0944)   furosemide 120 mg (08/10/23 0748)   PRN Meds: acetaminophen **OR** acetaminophen, albuterol, docusate sodium, mouth rinse, oxyCODONE, traZODone   Vital Signs    Vitals:   08/09/23 2358 08/10/23 0355 08/10/23 0600 08/10/23 0942  BP:  116/69  118/64  Pulse: (!) 59 (!) 53  (!) 50  Resp:  14    Temp:  97.9 F (36.6 C)    TempSrc:      SpO2:  93%    Weight:   (!) 210.4 kg   Height:        Intake/Output Summary (Last 24 hours) at 08/10/2023 1110 Last data filed at 08/09/2023 1700 Gross per 24 hour  Intake 120 ml  Output 350 ml  Net -230 ml      08/10/2023    6:00 AM 08/09/2023    5:41 AM 08/08/2023    9:52 AM  Last 3 Weights  Weight (lbs) 463 lb 13.6 oz 471 lb 9 oz 466 lb 15 oz  Weight (kg) 210.4 kg 213.9 kg 211.8 kg     Telemetry    SB 50s rare PVCs, occasional couplet, no further NSVT - Personally Reviewed  Physical Exam   GEN: No acute distress. Super morbidly obese. HEENT: Normocephalic, atraumatic, sclera non-icteric. Neck: Unable to adequately assess JVD with body habitus Cardiac: RRR no murmurs, rubs, or gallops.   Respiratory: Clear to auscultation bilaterally. Breathing is unlabored. GI: Soft, nontender, non-distended, BS +x 4. Abd feels softer than earlier this week. MS: no deformity. Extremities: No clubbing or cyanosis. Diffuse lymphedema noted. S/p R BKA. Neuro:  AAOx3. Follows commands. Psych:  Responds to questions appropriately with a normal affect.    Labs    High Sensitivity Troponin:   Recent Labs  Lab 08/01/23 1140 08/01/23 1414  TROPONINIHS 33* 36*      Cardiac EnzymesNo results for input(s): "TROPONINI" in the last 168 hours. No results for input(s): "TROPIPOC" in the last 168 hours.   Chemistry Recent Labs  Lab 08/05/23 0542 08/06/23 0456 08/07/23 0507 08/08/23 0528 08/09/23 0515 08/10/23 0547  NA 135 135   < > 135 135 134*  K 4.1 3.0*   < > 3.7 4.0 4.7  CL 102 101   < > 101 100 101  CO2 24 26   < > 27 25 21*  GLUCOSE 199* 160*   < > 205* 146* 134*  BUN 67* 65*   < > 68* 74* 76*  CREATININE 2.18* 2.06*   < > 2.00* 2.02* 2.51*  CALCIUM 8.4* 8.5*   < >  8.7* 8.8* 8.8*  ALBUMIN 2.6* 2.5*  --   --   --   --   GFRNONAA 28* 30*   < > 31* 30* 23*  ANIONGAP 9 8   < > 7 10 12    < > = values in this interval not displayed.     Hematology Recent Labs  Lab 08/06/23 0456 08/07/23 0507 08/08/23 0528  WBC 5.8 5.3 5.1  RBC 4.08 4.25 4.20  HGB 11.5* 11.8* 11.5*  HCT 35.4* 37.7 37.5  MCV 86.8 88.7 89.3  MCH 28.2 27.8 27.4  MCHC 32.5 31.3 30.7  RDW 13.7 13.7 13.8  PLT 216 224 222    BNPNo results for input(s): "BNP", "PROBNP" in the last 168 hours.   DDimer No results for input(s): "DDIMER" in the last 168 hours.   Radiology    No results found.  Cardiac Studies       Echo 08/02/23   1. No apical thrombus with Definity. Left ventricular ejection fraction,  by estimation, is 35 to 40%. Left ventricular ejection fraction by 2D MOD  biplane is 40.8 %. The left ventricle has moderately decreased function.  The left ventricle demonstrates  regional wall  motion abnormalities (see scoring diagram/findings for  description). The left ventricular internal cavity size was mildly  dilated. Left ventricular diastolic parameters are consistent with Grade  III diastolic dysfunction (restrictive).  Elevated left ventricular end-diastolic pressure. There is abnormal  (paradoxical) septal motion, consistent with right ventricular volume  overload. There is severe akinesis of the left ventricular, mid inferior  wall and inferolateral wall.   2. Right ventricular systolic function is low normal. The right  ventricular size is normal. There is severely elevated pulmonary artery  systolic pressure. The estimated right ventricular systolic pressure is  69.5 mmHg.   3. Left atrial size was moderately dilated.   4. Right atrial size was severely dilated.   5. The mitral valve is abnormal. Mild mitral valve regurgitation.   6. The tricuspid valve is abnormal. Tricuspid valve regurgitation is  moderate.   7. The aortic valve is tricuspid. Aortic valve regurgitation is not  visualized. No aortic stenosis is present.   8. The inferior vena cava is dilated in size with <50% respiratory  variability, suggesting right atrial pressure of 15 mmHg.   Comparison(s): Changes from prior study are noted. 03/23/2023: LVEF 45%  with apical hypokinesis.   Conclusion(s)/Recommendation(s): Compared to the prior study, there appear  to be new regional WMA's - suggestive of LCX territory ischemia/infarct.   Patient Profile      48 y.o. 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  Wheelchair bound at baseline. Cardiology following for HF.   Assessment & Plan    1. Acute on chronic HFrEF, right heart failure, severe pHTN with progressive cardiomyopathy - previous low-normal EF 50-55% then 45-50% in 02/2023 then 35-40% this admission with G3DD, elevated LVEDP, abnormal (paradoxical) septal motion, consistent with right ventricular volume overload, severe akinesis of the left ventricular, mid inferior wall and inferolateral wall, severe pulm HTN, moderate LAE, severe RAE, mild MR, moderate TR, dilated IVC - cardiology consulted 08/01/22 for assistance with diuresis, IV Lasix increased, intermittent metolazone dosing, limited by hypokalemia -  last metolazone dose 1/15 - suspect right heart / severe pHTN due to HF in setting of severe obesity with suspected OHS/OSA. Not currently candidate for RHC with cath table weight limit of 450lb - not candidate for SGLT2i in setting of super morbid obesity and h/o fungemia - I/O's indicate poor UOP the last 24 hours and canister shows dark concentrated urine. Weight continues to decline. Creatinine has acutely risen further and BUN is 76 so will hold IV Lasix pending review with MD  2. NSVT/PVCs noted this admission - started on metoprolol 1/16 but now with sinus brady in the 50s, will hold and follow (only got one dose) - rare PVCs, couplets overnight, no further NSVT - K, Mg OK - TSH mgmt per primary team  3. Elevated troponin - HS-troponin mildly elevated felt likely due to her heart failure exacerbation - however, given decline in LVEF and WMA, Dr. Duke Salvia recommended considering LHC when Cr improved - cath lab table weight limit is 450lb so presently not a candidate. Suspect she would also benefit from RHC but cannot pursue at current weight. - no ASA presently given concomitant Eliquis - on statin therapy  4. Super morbid obesity with suspected OSA/OHS - needs eval for sleep apnea - will need assessment for exertional home O2  prior to dc  5. AKI on CKD 3a - previous Cr 1.3-1.7 in 02/2023, here peaking 2.50, was trending downward then increased today   6. H/o PE/DVT, stroke, PFO - continue anticoagulation - can revisit PFO as outpatient though poor surgical candidate currently    7. Hypokalemia - resolved   8. Hypothyroidism - TSH elevated at 4.613 this admission, further per primary team   9. Anemia, mild - per primary team  Remainder per primary  For questions or updates, please contact [facility] Please consult [email or web address] for contact info under Cardiology/STEMI.  Signed, Laurann Montana, PA-C 08/10/2023, 11:10 AM

## 2023-08-10 NOTE — Progress Notes (Signed)
PICC order noted, patient has documented subclavian stenosis and is an interventional radiology placement.  Shea Evans, PA and Victorino Dike, RN bedside nurse notified.

## 2023-08-10 NOTE — Progress Notes (Addendum)
  Received a secure chat message from message from cardiology team that patient is to be transferred to the [facility] progressive unit for evaluation by cardiology and cardiology, recheck BMP.  -I have requested bed placement to transfer this patient to progressive unit at [facility].  At this moment no bed available at progressive unit at [facility] and patient will be on a wait list.  Will follow-up with a BMP and will plan to challenge patient with IV diuretics tonight.   Tereasa Coop, MD [facility] 08/10/2023, 8:55 PM

## 2023-08-11 DIAGNOSIS — I5033 Acute on chronic diastolic (congestive) heart failure: Secondary | ICD-10-CM | POA: Diagnosis not present

## 2023-08-11 DIAGNOSIS — N1831 Chronic kidney disease, stage 3a: Secondary | ICD-10-CM | POA: Diagnosis not present

## 2023-08-11 LAB — CBC
HCT: 37.7 % (ref 36.0–46.0)
Hemoglobin: 11.4 g/dL — ABNORMAL LOW (ref 12.0–15.0)
MCH: 27.4 pg (ref 26.0–34.0)
MCHC: 30.2 g/dL (ref 30.0–36.0)
MCV: 90.6 fL (ref 80.0–100.0)
Platelets: 237 10*3/uL (ref 150–400)
RBC: 4.16 MIL/uL (ref 3.87–5.11)
RDW: 13.8 % (ref 11.5–15.5)
WBC: 5.2 10*3/uL (ref 4.0–10.5)
nRBC: 0 % (ref 0.0–0.2)

## 2023-08-11 LAB — BASIC METABOLIC PANEL
Anion gap: 7 (ref 5–15)
BUN: 82 mg/dL — ABNORMAL HIGH (ref 6–20)
CO2: 29 mmol/L (ref 22–32)
Calcium: 8.6 mg/dL — ABNORMAL LOW (ref 8.9–10.3)
Chloride: 98 mmol/L (ref 98–111)
Creatinine, Ser: 2.68 mg/dL — ABNORMAL HIGH (ref 0.44–1.00)
GFR, Estimated: 22 mL/min — ABNORMAL LOW (ref >60)
Glucose, Bld: 124 mg/dL — ABNORMAL HIGH (ref 70–99)
Potassium: 3.9 mmol/L (ref 3.5–5.1)
Sodium: 134 mmol/L — ABNORMAL LOW (ref 135–145)

## 2023-08-11 LAB — GLUCOSE, CAPILLARY
Glucose-Capillary: 109 mg/dL — ABNORMAL HIGH (ref 70–99)
Glucose-Capillary: 118 mg/dL — ABNORMAL HIGH (ref 70–99)
Glucose-Capillary: 109 mg/dL — ABNORMAL HIGH (ref 70–99)
Glucose-Capillary: 135 mg/dL — ABNORMAL HIGH (ref 70–99)

## 2023-08-11 LAB — HEPATIC FUNCTION PANEL
ALT: 11 U/L (ref 0–44)
AST: 11 U/L — ABNORMAL LOW (ref 15–41)
Albumin: 2.6 g/dL — ABNORMAL LOW (ref 3.5–5.0)
Alkaline Phosphatase: 51 U/L (ref 38–126)
Bilirubin, Direct: 0.1 mg/dL (ref 0.0–0.2)
Indirect Bilirubin: 0.4 mg/dL (ref 0.3–0.9)
Total Bilirubin: 0.5 mg/dL (ref 0.0–1.2)
Total Protein: 6.8 g/dL (ref 6.5–8.1)

## 2023-08-11 LAB — COOXEMETRY PANEL
Carboxyhemoglobin: 1.7 % — ABNORMAL HIGH (ref 0.5–1.5)
Methemoglobin: <0.7 % (ref 0.0–1.5)
O2 Saturation: 68.6 %
Total hemoglobin: 11.6 g/dL — ABNORMAL LOW (ref 12.0–16.0)

## 2023-08-11 MED ORDER — FUROSEMIDE 10 MG/ML IJ SOLN
60.0000 mg | Freq: Once | INTRAMUSCULAR | Status: AC
  Administered 2023-08-11: 60 mg via INTRAVENOUS
  Filled 2023-08-11: qty 6

## 2023-08-11 MED ORDER — FUROSEMIDE 10 MG/ML IJ SOLN
120.0000 mg | Freq: Two times a day (BID) | INTRAVENOUS | Status: DC
  Administered 2023-08-11 – 2023-08-13 (×6): 120 mg via INTRAVENOUS
  Filled 2023-08-11 (×2): qty 10
  Filled 2023-08-11: qty 12
  Filled 2023-08-11: qty 2
  Filled 2023-08-11 (×3): qty 10
  Filled 2023-08-11 (×2): qty 12

## 2023-08-11 MED ORDER — MILRINONE LACTATE IN DEXTROSE 20-5 MG/100ML-% IV SOLN
0.2500 ug/kg/min | INTRAVENOUS | Status: DC
  Administered 2023-08-11 – 2023-08-19 (×29): 0.25 ug/kg/min via INTRAVENOUS
  Filled 2023-08-11 (×30): qty 100

## 2023-08-11 MED ORDER — SODIUM CHLORIDE 0.9% FLUSH: 10.0000 mL | INTRAVENOUS | Status: DC | PRN

## 2023-08-11 MED ORDER — DEXTROSE 5 % IV SOLN
120.0000 mg | Freq: Two times a day (BID) | INTRAVENOUS | Status: DC
  Filled 2023-08-11: qty 12

## 2023-08-11 MED ORDER — SODIUM CHLORIDE 0.9% FLUSH
10.0000 mL | Freq: Two times a day (BID) | INTRAVENOUS | Status: DC
  Administered 2023-08-11 – 2023-08-14 (×8): 10 mL
  Administered 2023-08-15: 20 mL
  Administered 2023-08-16 – 2023-08-21 (×10): 10 mL

## 2023-08-11 MED ORDER — FUROSEMIDE 10 MG/ML IJ SOLN
120.0000 mg | Freq: Once | INTRAVENOUS | Status: DC
  Filled 2023-08-11: qty 12

## 2023-08-11 NOTE — Progress Notes (Signed)
Patient Name: Karen Bennett Date of Encounter: 08/11/2023 [facility] Cardiologist: Chilton Si, MD   Interval Summary  .    She is doing OK. Her legs hurt due to the tight edema. No chest pain or dyspnea. On RA. Mediocre diuresis in last 24 hours 1.5 L, but net -10 L since admission.  Most recent CVP 14.  Venous O2 sat yesterday evening 48%. Renal function has stabilized, but worse than her baseline.  Vital Signs .    Vitals:   08/10/23 2200 08/10/23 2300 08/11/23 0315 08/11/23 0500  BP: (!) 125/58 131/63    Pulse:  (!) 55    Resp: 20 20    Temp:  97.8 F (36.6 C) 97.9 F (36.6 C)   TempSrc:  Oral Oral   SpO2:  (!) 89%    Weight:    (!) 210.4 kg  Height:        Intake/Output Summary (Last 24 hours) at 08/11/2023 0802 Last data filed at 08/11/2023 0300 Gross per 24 hour  Intake 432.8 ml  Output 600 ml  Net -167.2 ml      08/11/2023    5:00 AM 08/10/2023    6:00 AM 08/09/2023    5:41 AM  Last 3 Weights  Weight (lbs) 463 lb 13.6 oz 463 lb 13.6 oz 471 lb 9 oz  Weight (kg) 210.4 kg 210.4 kg 213.9 kg      Telemetry/ECG    Sinus rhythm- Personally Reviewed  Physical Exam .   GEN: No acute distress.  Morbidly obese Neck: No JVD Cardiac: Limited exam due to body habitus, RRR, no murmurs, rubs, or gallops.  Respiratory: Clear to auscultation bilaterally. GI: Soft, nontender, non-distended  MS: No edema  Assessment & Plan .     48 year old woman with history of super obesity, type 2 diabetes mellitus complicated by CKD stage IIIa, right BKA, chronic heart failure with depressed LVEF (additional reduction noted on this admission at 35-40%, restrictive filling pattern, inferior and inferolateral akinesis), severe pulmonary hypertension, severe TR,  suspected OSA/obesity hypoventilation syndrome, history DVT/PE on chronic anticoagulation and PFO with possible paradoxical embolism as cause of right inferior cerebellar ischemic stroke in August 2024, remote  history of Candida albicans fungemia and mitral valve endocarditis 2022  LVEF 35% with global hypokinesis and marked displacement of the ventricular septum due to RV overload. On my review of her echocardiogram her tricuspid regurgitation is clearly severe, not moderate.  There is prominent systolic flattening of the ventricular septum that suggests near-systemic PA pressure.  Estimated systolic PA pressure based on invasive RA pressure monitoring and echo TR jet is approx 70 mmHg. On my review of the echo images, I am not confident that there is disproportionate inferior hypokinesis - seems to be a global LV cardiomyopathy. LV filling is pseudonormal, but not restrictive (MV Dec time is 240 ms).  Cardiogenic shock due to acute on chronic severe right heart failure (dominant mechanism) and moderate LV systolic failure Severe PAH Acute kidney injury on chronic CKD stage III Super obesity with multiple complications including DM2, OSA, OHS Possible CAD based on report of regional wall motion abnormalities, questionable diagnosis  Continue IV diuretics. Awaiting transfer to [facility]. Will need milrinone IV for inotropic support and pulmonary vasodilation. If transfer is delayed, will start that here later this morning.  Eventually would benefit from R and L heart cath, but current weight exceeds cath lab table limit.  CRITICAL CARE Performed by: Rachelle Hora Meagen Limones   Total critical  care time: 45 minutes  Critical care time was exclusive of separately billable procedures and treating other patients.  Critical care was necessary to treat or prevent imminent or life-threatening deterioration.  Critical care was time spent personally by me on the following activities: development of treatment plan with patient and/or surrogate as well as nursing, discussions with consultants, evaluation of patient's response to treatment, examination of patient, obtaining history from patient or surrogate, ordering and  performing treatments and interventions, ordering and review of laboratory studies, ordering and review of radiographic studies, pulse oximetry and re-evaluation of patient's condition.   For questions or updates, please contact [facility] Please consult [email or web address] for contact info under        Signed, Thurmon Fair, MD

## 2023-08-11 NOTE — Significant Event (Signed)
I was contacted by the nurse covering Ms. Batzel regarding continuation of IV furosemide drip.  Drip was held initially due to concerns for over diuresis.  Central line was placed yesterday with CVP reported at .  Renal function slowly gotten worse with discontinuation of IV furosemide yesterday.  Decided to restart IV furosemide drip given concern for cardiorenal syndrome.  May need to start inotropic agent if worsening renal function (will discuss further with day time team).  Patient is currently awaiting transfer to our CICU.

## 2023-08-11 NOTE — Progress Notes (Signed)
[facility] Progress Note  Patient: Karen Bennett     ZDG:[id number]  DOA: 08/01/2023   PCP: [name], NP       Brief hospital course: This is a 48 year old female with diabetes, chronic kidney disease, mitral valve endocarditis in 2022, right BKA, hypertension and hypothyroidism who is wheelchair-bound and presented to the hospital with edema, orthopnea, PND and abdominal distention.  She was felt to be volume overloaded and started on diuretics.  Subjective:   Nausea - no vomiting  Assessment and Plan: Principal Problem:  Systolic and diastolic CHF, Cardiogenic shock -Patient has an EF of 35 to 40% and grade 3 d dysfunction and severe right heart failure and PAH - R internal jugular placed on 1/17 by radiology for Co ox - cardiology considering Milrinone-  -Plan for right and left heart cath after she was adequately diuresed  Active Problems:  Nausea - occasional vomiting - benign abdominal exam- not constipated - started Pepcid - a little better  Edema R hand - new as of 1/18- have asked her to elevate- follow - on Eliquis so low risk for DVT  NSVT  - short run  Chronic kidney disease - Appears to be at between stage 3-4    Obesity, Class III, BMI 40-49.9 (morbid obesity) (HCC) Body mass index is 74.87 kg/m.    Normocytic anemia   Insulin dependent type 2 diabetes mellitus (HCC) - cont long and short acting insulin  H/o PE- 7/224 - cont Eliquis - high risk for PE as she is essentially bedbound      Code Status: Full Code Total time on patient care: 35 min DVT prophylaxis:  SCDs Start: 08/01/23 1400 apixaban (ELIQUIS) tablet 5 mg     Objective:   Vitals:   08/10/23 2300 08/11/23 0315 08/11/23 0500 08/11/23 0745  BP: 131/63     Pulse: (!) 55     Resp: 20     Temp: 97.8 F (36.6 C) 97.9 F (36.6 C)  97.9 F (36.6 C)  TempSrc: Oral Oral  Oral  SpO2: (!) 89%     Weight:   (!) 210.4 kg   Height:       Filed Weights   08/09/23  0541 08/10/23 0600 08/11/23 0500  Weight: (!) 213.9 kg (!) 210.4 kg (!) 210.4 kg   Exam: General exam: Appears comfortable  HEENT: oral mucosa moist Respiratory system: Clear to auscultation.  Cardiovascular system: S1 & S2 heard  Gastrointestinal system: Abdomen soft, non-tender, nondistended. Normal bowel sounds   Extremities: No cyanosis, clubbing - edema in thighs noted Psychiatry:  Mood & affect appropriate.      CBC: Recent Labs  Lab 08/05/23 0542 08/06/23 0456 08/07/23 0507 08/08/23 0528 08/11/23 0541  WBC 6.7 5.8 5.3 5.1 5.2  HGB 11.6* 11.5* 11.8* 11.5* 11.4*  HCT 37.2 35.4* 37.7 37.5 37.7  MCV 89.2 86.8 88.7 89.3 90.6  PLT 226 216 224 222 237   Basic Metabolic Panel: Recent Labs  Lab 08/05/23 0542 08/06/23 0456 08/07/23 0507 08/08/23 0528 08/09/23 0515 08/10/23 0547 08/10/23 2041 08/11/23 0541  NA 135 135 135 135 135 134* 134* 134*  K 4.1 3.0* 3.5 3.7 4.0 4.7 4.2 3.9  CL 102 101 100 101 100 101 96* 98  CO2 24 26 28 27 25  21* 26 29  GLUCOSE 199* 160* 163* 205* 146* 134* 167* 124*  BUN 67* 65* 70* 68* 74* 76* 84* 82*  CREATININE 2.18* 2.06* 2.20* 2.00* 2.02* 2.51* 2.74* 2.68*  CALCIUM  8.4* 8.5* 8.5* 8.7* 8.8* 8.8* 8.5* 8.6*  MG 2.2 2.1 2.1 2.0  --   --   --   --   PHOS 5.3* 4.9*  --   --   --   --   --   --      Scheduled Meds:  apixaban  5 mg Oral BID   atorvastatin  40 mg Oral q1800   Chlorhexidine Gluconate Cloth  6 each Topical Daily   Gerhardt's butt cream   Topical TID   insulin aspart  0-15 Units Subcutaneous TID WC   insulin aspart  0-5 Units Subcutaneous QHS   insulin glargine-yfgn  10 Units Subcutaneous Daily   levothyroxine  75 mcg Oral Q0600   lidocaine  1 patch Transdermal Q24H   promethazine  25 mg Oral BID AC   sodium chloride flush  10-40 mL Intracatheter Q12H   venlafaxine XR  37.5 mg Oral Daily    Imaging and lab data personally reviewed   Author: Calvert Cantor  08/11/2023 11:22 AM  To contact [facility]>   Check  the care team in [facility] and look for the attending/consulting [facility] provider listed  Log into [email or web address] and use [place]'s universal password   Go to> "[facility]"  and find provider  If you still have difficulty reaching the provider, please page the [facility] (Director on Call) for the Hospitalists listed on amion

## 2023-08-11 NOTE — Plan of Care (Signed)
  Problem: Education: Goal: Individualized Educational Video(s) Outcome: Progressing   Problem: Nutritional: Goal: Maintenance of adequate nutrition will improve Outcome: Progressing   Problem: Skin Integrity: Goal: Risk for impaired skin integrity will decrease Outcome: Progressing   Problem: Tissue Perfusion: Goal: Adequacy of tissue perfusion will improve Outcome: Progressing

## 2023-08-11 NOTE — Plan of Care (Signed)
Care transferred to [facility]- Appreciate assistance

## 2023-08-11 NOTE — Consult Note (Signed)
NAME:  Karen Bennett, MRN:  [id number], DOB:  [birth year], LOS: 10 ADMISSION DATE:  08/01/2023, CONSULTATION DATE:  08/11/23 REFERRING MD:  THR CHIEF COMPLAINT:  Heart Failure   History of Present Illness:  Karen Bennett is a 48 year old woman with history of DMII, CKDIIIa, hypertension, pulmonary embolus and morbid obesity who was admitted 1/8 for increasing lower extremity edema. Cardiology has been following and diuresing patient. She had rise in her Cr and reduced urine output. She had PICC line placed with Coox of 47% on 1/17. She was started on milrinone this morning by cardiology to further assist in diuresis. PCCM consulted.  She denies cough, dyspnea, fever or chills. She reports no issues since milrinone stated.   Echo 1/9 showed EF 35-40%.  Grade III diastolic dysfunction. RV systolic function is low normal. Severely elevated pulmonary artery pressures at .   Pertinent  Medical History   Past Medical History:  Diagnosis Date   Anxiety and depression 06/26/2013   Cellulitis    Chronic kidney disease, stage 3a (HCC) 01/11/2023   Diabetes mellitus    Hyperlipidemia associated with type 2 diabetes mellitus (HCC) 05/02/2013   Hypertension associated with diabetes (HCC) 12/27/2020   Hypothyroidism    Infective endocarditis    PE (pulmonary embolism) ~2007-2008   Not on anticoagulation    Significant Hospital Events: Including procedures, antibiotic start and stop dates in addition to other pertinent events   1/8 admitted to [facility] 1/18 PCCM consulted, started on milrinone, transfer to [facility] ICU requested  Interim History / Subjective:  As above  Objective   Blood pressure 131/63, pulse (!) 55, temperature 97.9 F (36.6 C), temperature source Oral, resp. rate 20, height 5\' 6"  (1.676 m), weight (!) 210.4 kg, last menstrual period 04/13/2013, SpO2 (!) 89%. CVP:  [14 mmHg-15 mmHg] 14 mmHg      Intake/Output Summary (Last 24 hours) at 08/11/2023 1051 Last data filed at  08/11/2023 1039 Gross per 24 hour  Intake 332.8 ml  Output 1100 ml  Net -767.2 ml   Filed Weights   08/09/23 0541 08/10/23 0600 08/11/23 0500  Weight: (!) 213.9 kg (!) 210.4 kg (!) 210.4 kg    Examination: General: obese young woman, no acute distress, resting in bed HENT: [place]/AT, moist mucous membranes Lungs: diminished breath sounds, no wheezing Cardiovascular: rrr, no murmurs Abdomen: soft, non-tender, BS+ Extremities: edematous, warm Neuro: alert, oriented, moving all extremities GU: n/a  Resolved Hospital Problem list     Assessment & Plan:   Cardiogenic Shock Systolic and Diastolic Heart Failure Pulmonary Hypertension Morbid Obesity, BMI 74 DMII OSA/OHS CKDIIIa Anemia, Normocytic Mild Hyponatremia  Plan: - transfer patient to 2H ICU  - Milrinone at 0.7mcg - Lasix 120mg  IV BID - Monitor BMP and replace electrolytes as needed - Consider CPAP/Bipap at night - Strict I/O - Monitor UOP and Serum Cr - Daily Coox   Best Practice (right click and "Reselect all SmartList Selections" daily)   Diet/type: Regular consistency (see orders) DVT prophylaxis DOAC Pressure ulcer(s): N/A GI prophylaxis: H2B Lines: Central line Foley:  N/A Code Status:  full code Last date of multidisciplinary goals of care discussion [1/18 discussed with patient]  Labs   CBC: Recent Labs  Lab 08/05/23 0542 08/06/23 0456 08/07/23 0507 08/08/23 0528 08/11/23 0541  WBC 6.7 5.8 5.3 5.1 5.2  HGB 11.6* 11.5* 11.8* 11.5* 11.4*  HCT 37.2 35.4* 37.7 37.5 37.7  MCV 89.2 86.8 88.7 89.3 90.6  PLT 226 216 224 222 237  Basic Metabolic Panel: Recent Labs  Lab 08/05/23 0542 08/06/23 0456 08/07/23 0507 08/08/23 0528 08/09/23 0515 08/10/23 0547 08/10/23 2041 08/11/23 0541  NA 135 135 135 135 135 134* 134* 134*  K 4.1 3.0* 3.5 3.7 4.0 4.7 4.2 3.9  CL 102 101 100 101 100 101 96* 98  CO2 24 26 28 27 25  21* 26 29  GLUCOSE 199* 160* 163* 205* 146* 134* 167* 124*  BUN 67* 65* 70*  68* 74* 76* 84* 82*  CREATININE 2.18* 2.06* 2.20* 2.00* 2.02* 2.51* 2.74* 2.68*  CALCIUM 8.4* 8.5* 8.5* 8.7* 8.8* 8.8* 8.5* 8.6*  MG 2.2 2.1 2.1 2.0  --   --   --   --   PHOS 5.3* 4.9*  --   --   --   --   --   --    GFR: Estimated Creatinine Clearance: 49.6 mL/min (A) (by C-G formula based on SCr of 2.68 mg/dL (H)). Recent Labs  Lab 08/06/23 0456 08/07/23 0507 08/08/23 0528 08/11/23 0541  WBC 5.8 5.3 5.1 5.2    Liver Function Tests: Recent Labs  Lab 08/05/23 0542 08/06/23 0456 08/11/23 0541  AST  --   --  11*  ALT  --   --  11  ALKPHOS  --   --  51  BILITOT  --   --  0.5  PROT  --   --  6.8  ALBUMIN 2.6* 2.5* 2.6*   No results for input(s): "LIPASE", "AMYLASE" in the last 168 hours. No results for input(s): "AMMONIA" in the last 168 hours.  ABG    Component Value Date/Time   HCO3 27.1 10/24/2021 1857   TCO2 22 08/01/2023 1501   ACIDBASEDEF 2.7 (H) 09/03/2021 0815   O2SAT 47.7 08/10/2023 1748     Coagulation Profile: No results for input(s): "INR", "PROTIME" in the last 168 hours.  Cardiac Enzymes: No results for input(s): "CKTOTAL", "CKMB", "CKMBINDEX", "TROPONINI" in the last 168 hours.  HbA1C: Hgb A1c MFr Bld  Date/Time Value Ref Range Status  08/01/2023 06:50 PM 7.2 (H) 4.8 - 5.6 % Final    Comment:    (NOTE) Pre diabetes:          5.7%-6.4%  Diabetes:              >6.4%  Glycemic control for   <7.0% adults with diabetes   01/12/2023 01:30 AM 10.7 (H) 4.8 - 5.6 % Final    Comment:    (NOTE) Pre diabetes:          5.7%-6.4%  Diabetes:              >6.4%  Glycemic control for   <7.0% adults with diabetes     CBG: Recent Labs  Lab 08/10/23 0726 08/10/23 1113 08/10/23 1633 08/10/23 2140 08/11/23 0750  GLUCAP 125* 131* 123* 168* 109*    Review of Systems:   Review of Systems  Constitutional:  Negative for chills, fever, malaise/fatigue and weight loss.  HENT:  Negative for congestion, sinus pain and sore throat.   Eyes: Negative.    Respiratory:  Positive for shortness of breath. Negative for cough, hemoptysis, sputum production and wheezing.   Cardiovascular:  Positive for orthopnea and leg swelling. Negative for chest pain, palpitations and claudication.  Gastrointestinal:  Negative for abdominal pain, heartburn, nausea and vomiting.  Genitourinary: Negative.   Musculoskeletal:  Negative for joint pain and myalgias.  Skin:  Negative for rash.  Neurological:  Negative for weakness.  Endo/Heme/Allergies: Negative.  Psychiatric/Behavioral: Negative.     Past Medical History:  She,  has a past medical history of Anxiety and depression (06/26/2013), Cellulitis, Chronic kidney disease, stage 3a (HCC) (01/11/2023), Diabetes mellitus, Hyperlipidemia associated with type 2 diabetes mellitus (HCC) (05/02/2013), Hypertension associated with diabetes (HCC) (12/27/2020), Hypothyroidism, Infective endocarditis, and PE (pulmonary embolism) (~2007-2008).   Surgical History:   Past Surgical History:  Procedure Laterality Date   Abscess removal from L groin     APPLICATION OF WOUND VAC Left 05/01/2013   Procedure: APPLICATION OF WOUND VAC;  Surgeon: Kerrin Champagne, MD;  Location: [facility];  Service: Orthopedics;  Laterality: Left;   BELOW KNEE LEG AMPUTATION Right    CYSTOSCOPY W/ URETERAL STENT PLACEMENT Right 09/03/2021   Procedure: CYSTOSCOPY WITH RETROGRADE PYELOGRAM/URETERAL STENT PLACEMENT;  Surgeon: Marcine Matar, MD;  Location: [facility];  Service: Urology;  Laterality: Right;   I & D EXTREMITY  11/12/2011   Procedure: IRRIGATION AND DEBRIDEMENT EXTREMITY;  Surgeon: Kathryne Hitch, MD;  Location: [facility];  Service: Orthopedics;  Laterality: Left;  foot left   I & D EXTREMITY Left 09/06/2012   Procedure: IRRIGATION AND DEBRIDEMENT EXTREMITY;  Surgeon: Eldred Manges, MD;  Location: [facility];  Service: Orthopedics;  Laterality: Left;   I & D EXTREMITY Left 05/01/2013   Procedure: INCISION AND DRAINAGE LEFT FOREFOOT ABCESS  ;  Surgeon: Kerrin Champagne, MD;  Location: [facility];  Service: Orthopedics;  Laterality: Left;   INCISION AND DRAINAGE ABSCESS Left 12/27/2020   Procedure: INCISION AND DRAINAGE LEFT THIGH ABSCESS;  Surgeon: Berna Bue, MD;  Location: [facility];  Service: General;  Laterality: Left;   IR FLUORO GUIDE CV LINE RIGHT  09/12/2021   IR PTA VENOUS EXCEPT DIALYSIS CIRCUIT  01/04/2021   IR RADIOLOGIST EVAL & MGMT  10/25/2021   IR US GUIDE VASC ACCESS RIGHT  09/12/2021   IR VENO/EXT/UNI RIGHT  01/04/2021   Surgery to remove hematoma in L leg     TEE WITHOUT CARDIOVERSION N/A 01/03/2021   Procedure: TRANSESOPHAGEAL ECHOCARDIOGRAM (TEE);  Surgeon: Quintella Reichert, MD;  Location: [facility] ENDOSCOPY;  Service: Cardiovascular;  Laterality: N/A;   TEE WITHOUT CARDIOVERSION N/A 03/23/2023   Procedure: TRANSESOPHAGEAL ECHOCARDIOGRAM;  Surgeon: [name], MD;  Location: [facility] INVASIVE CV LAB;  Service: Cardiovascular;  Laterality: N/A;   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     Social History:   reports that she has been smoking cigarettes. She has never been exposed to tobacco smoke. She has never used smokeless tobacco. She reports that she does not drink alcohol and does not use drugs.   Family History:  Her family history includes Coronary artery disease in her mother; Diabetes in her mother; Stroke in her mother.   Allergies Allergies  Allergen Reactions   Clindamycin Diarrhea and Nausea And Vomiting   Zofran Itching and Nausea And Vomiting   Doxycycline Diarrhea and Nausea And Vomiting   Morphine And Codeine Hives, Itching and Rash     Home Medications  Prior to Admission medications   Medication Sig Start Date End Date Taking? Authorizing Provider  apixaban (ELIQUIS) 5 MG TABS tablet Take 1 tablet (5 mg total) by mouth 2 (two) times daily. 03/23/23  Yes Rhetta Mura, MD  atorvastatin (LIPITOR) 40 MG tablet Take 1 tablet (40 mg total) by mouth daily at 6 PM. 03/23/23 08/01/23 Yes  Rhetta Mura, MD  Continuous Glucose Sensor (FREESTYLE LIBRE 3 SENSOR) MISC Place 1 sensor  on the skin every 14 days. Use to check glucose continuously 03/23/23  Yes Rhetta Mura, MD  hydrOXYzine (VISTARIL) 50 MG capsule Take 50 mg by mouth 2 (two) times daily as needed for itching or anxiety.   Yes [provider]  insulin lispro (HUMALOG) 100 UNIT/ML injection Inject 1-6 Units into the skin See admin instructions. Inject 1-6 units into the skin three times a day with meals, PER SLIDING SCALE   Yes [provider]  JARDIANCE 25 MG TABS tablet Take 25 mg by mouth daily.   Yes [provider]  LANTUS SOLOSTAR 100 UNIT/ML Solostar Pen Inject 10 Units into the skin at bedtime.   Yes [provider]  levothyroxine (SYNTHROID) 75 MCG tablet Take 75 mcg by mouth daily before breakfast.   Yes [provider]  methocarbamol (ROBAXIN) 750 MG tablet Take 750 mg by mouth every 8 (eight) hours as needed for muscle spasms. 07/09/23  Yes [provider]  minoxidil (LONITEN) 10 MG tablet Take 5 mg by mouth daily.   Yes [provider]  MOUNJARO 2.5 MG/0.5ML Pen Inject 2.5 mg into the skin every Wednesday.   Yes [provider]  Oxycodone HCl 10 MG TABS Take 1 tablet (10 mg total) by mouth every 12 (twelve) hours as needed. Patient taking differently: Take 10 mg by mouth in the morning, at noon, and at bedtime. 03/13/23  Yes Gerhard Munch, MD  potassium chloride (KLOR-CON M) 10 MEQ tablet Take 10 mEq by mouth in the morning and at bedtime. 07/16/23  Yes [provider]  promethazine (PHENERGAN) 25 MG tablet Take 25 mg by mouth every 6 (six) hours as needed for nausea or vomiting.   Yes [provider]  torsemide (DEMADEX) 20 MG tablet Take 20 mg by mouth in the morning, at noon, and at bedtime. 07/26/23  Yes [provider]  traZODone (DESYREL) 50 MG tablet Take 50 mg by mouth at bedtime.   Yes [provider]  triamcinolone cream (KENALOG) 0.5 % Apply 1 Application topically 3 (three) times daily as needed (for itching or irritation- affected areas).   Yes [provider]  TYLENOL 500 MG tablet Take 500-1,000 mg by mouth every 6 (six) hours as needed for mild pain (pain score 1-3) or headache.   Yes [provider]  furosemide (LASIX) 20 MG tablet Take 1 tablet (20 mg total) by mouth every other day. Patient not taking: Reported on 08/01/2023 02/23/23 08/01/23  Narda Bonds, MD  glucose blood test strip Use as instructed 06/26/13   Doris Cheadle, MD  glucose monitoring kit (FREESTYLE) monitoring kit 1 each by Does not apply route as needed for other. 06/26/13   Doris Cheadle, MD  insulin glargine (SEMGLEE, YFGN,) 100 UNIT/ML Solostar Pen Inject 10 Units into the skin at bedtime. Patient not taking: Reported on 08/01/2023 03/23/23   Rhetta Mura, MD  levothyroxine (SYNTHROID) 50 MCG tablet Take 1 tablet (50 mcg total) by mouth daily at 6 (six) AM. Patient not taking: Reported on 03/22/2023 02/23/23   Narda Bonds, MD  venlafaxine XR (EFFEXOR-XR) 37.5 MG 24 hr capsule Take 37.5 mg by mouth daily. 06/27/23   [provider]     Critical care time: 38    Melody Comas, MD [facility] Office: [phone]   See Amion for personal pager PCCM on call pager [phone] until 7pm. Please call Elink 7p-7a. [phone]

## 2023-08-12 DIAGNOSIS — I5033 Acute on chronic diastolic (congestive) heart failure: Secondary | ICD-10-CM | POA: Diagnosis not present

## 2023-08-12 LAB — BASIC METABOLIC PANEL
Anion gap: 12 (ref 5–15)
BUN: 77 mg/dL — ABNORMAL HIGH (ref 6–20)
CO2: 26 mmol/L (ref 22–32)
Calcium: 8.4 mg/dL — ABNORMAL LOW (ref 8.9–10.3)
Chloride: 98 mmol/L (ref 98–111)
Creatinine, Ser: 2.59 mg/dL — ABNORMAL HIGH (ref 0.44–1.00)
GFR, Estimated: 22 mL/min — ABNORMAL LOW (ref >60)
Glucose, Bld: 122 mg/dL — ABNORMAL HIGH (ref 70–99)
Potassium: 3.2 mmol/L — ABNORMAL LOW (ref 3.5–5.1)
Sodium: 136 mmol/L (ref 135–145)

## 2023-08-12 LAB — GLUCOSE, CAPILLARY
Glucose-Capillary: 120 mg/dL — ABNORMAL HIGH (ref 70–99)
Glucose-Capillary: 124 mg/dL — ABNORMAL HIGH (ref 70–99)
Glucose-Capillary: 127 mg/dL — ABNORMAL HIGH (ref 70–99)
Glucose-Capillary: 146 mg/dL — ABNORMAL HIGH (ref 70–99)
Glucose-Capillary: 177 mg/dL — ABNORMAL HIGH (ref 70–99)

## 2023-08-12 LAB — MAGNESIUM: Magnesium: 1.9 mg/dL (ref 1.7–2.4)

## 2023-08-12 LAB — COOXEMETRY PANEL
Carboxyhemoglobin: 2 % — ABNORMAL HIGH (ref 0.5–1.5)
Methemoglobin: <0.7 % (ref 0.0–1.5)
O2 Saturation: 75 %
Total hemoglobin: 10.7 g/dL — ABNORMAL LOW (ref 12.0–16.0)

## 2023-08-12 MED ORDER — METOCLOPRAMIDE HCL 5 MG/ML IJ SOLN
5.0000 mg | Freq: Once | INTRAMUSCULAR | Status: AC
  Administered 2023-08-12: 5 mg via INTRAVENOUS
  Filled 2023-08-12: qty 2

## 2023-08-12 MED ORDER — POTASSIUM CHLORIDE 10 MEQ/100ML IV SOLN
10.0000 meq | INTRAVENOUS | Status: AC
  Administered 2023-08-12 (×2): 10 meq via INTRAVENOUS
  Filled 2023-08-12 (×2): qty 100

## 2023-08-12 MED ORDER — POTASSIUM CHLORIDE CRYS ER 20 MEQ PO TBCR
40.0000 meq | EXTENDED_RELEASE_TABLET | Freq: Two times a day (BID) | ORAL | Status: DC
Start: 2023-08-12 — End: 2023-08-13
  Administered 2023-08-12 (×2): 40 meq via ORAL
  Filled 2023-08-12 (×2): qty 2

## 2023-08-12 NOTE — Consult Note (Signed)
Advanced Heart Failure Team Consult Note   Primary Physician: [name], NP Cardiologist:  Chilton Si, MD  Reason for Consultation: CHF/cardiogenic shock  HPI:    Karen Bennett is seen today for evaluation of CHF/cardiogenic shock at the request of Dr. Royann Shivers.   48 y.o. 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  Patient does not appear to follow with cardiology as an outpatient.  She has been on anticoagulation for prior PE/DVT.  She has a PFO and had CVA in 8/24 possibly related to paradoxical embolization.  She had Candida albicans bacteremia in 6/22, found to have mitral valve vegetation at that time, treated medically.  She has history of osteomyelitis, had right BKA.  She quit smoking back in 2024.  CKD 3, baseline creatinine appears to be around 1.7.   Echo this admission was reviewed, EF 35-40% range appears globally hypokinetic to me with D-shaped septum and mild RV dilation/mild RV enlargement, PASP 70 mmHg, mild MR, severe TR, IVC dilated.   She was admitted with about 2 wks of progressive dyspnea and peripheral edema.  Started on IV Lasix and initially diuresed well.  However, creatinine rose from 2 to around 2.7 and UOP slowed.  PICC placed, initial co-ox 48% with elevated CVP.  Milrinone 0.25 started yesterday pm and she was transferred to [facility].  I/Os recorded net negative 1486 for the last day.  Currently on Lasix 120 mg bid. Creatinine 2.68 => 2.59.  Noted to have frequent PVCs this morning, K is low. This morning, CVP 12-13 with co-ox 75%.   Home Medications Prior to Admission medications   Medication Sig Start Date End Date Taking? Authorizing Provider  apixaban (ELIQUIS) 5 MG TABS tablet Take 1 tablet (5 mg total)  by mouth 2 (two) times daily. 03/23/23  Yes Rhetta Mura, MD  atorvastatin (LIPITOR) 40 MG tablet Take 1 tablet (40 mg total) by mouth daily at 6 PM. 03/23/23 08/01/23 Yes Samtani, Alta Corning, MD  Continuous Glucose Sensor (FREESTYLE LIBRE 3 SENSOR) MISC Place 1 sensor on the skin every 14 days. Use to check glucose continuously 03/23/23  Yes Rhetta Mura, MD  hydrOXYzine (VISTARIL) 50 MG capsule Take 50 mg by mouth 2 (two) times daily as needed for itching or anxiety.   Yes [provider]  insulin lispro (HUMALOG) 100 UNIT/ML injection Inject 1-6 Units into the skin See admin instructions. Inject 1-6 units into the skin three times a day with meals, PER SLIDING SCALE   Yes [provider]  JARDIANCE 25 MG TABS tablet Take 25 mg by mouth daily.   Yes [provider]  LANTUS SOLOSTAR 100 UNIT/ML Solostar Pen Inject 10 Units into the skin at bedtime.   Yes [provider]  levothyroxine (SYNTHROID) 75 MCG tablet Take 75 mcg by mouth daily before breakfast.   Yes [provider]  methocarbamol (ROBAXIN) 750 MG tablet Take 750 mg by mouth every 8 (eight) hours as needed for muscle spasms. 07/09/23  Yes [provider]  minoxidil (LONITEN) 10 MG tablet Take 5 mg by mouth daily.   Yes [provider]  MOUNJARO 2.5 MG/0.5ML Pen Inject 2.5 mg into the skin every Wednesday.   Yes [provider]  Oxycodone HCl 10 MG TABS Take 1 tablet (10  mg total) by mouth every 12 (twelve) hours as needed. Patient taking differently: Take 10 mg by mouth in the morning, at noon, and at bedtime. 03/13/23  Yes Gerhard Munch, MD  potassium chloride (KLOR-CON M) 10 MEQ tablet Take 10 mEq by mouth in the morning and at bedtime. 07/16/23  Yes [provider]  promethazine (PHENERGAN) 25 MG tablet Take 25 mg by mouth every 6 (six) hours as needed for nausea or vomiting.   Yes [provider]  torsemide (DEMADEX) 20 MG tablet Take  20 mg by mouth in the morning, at noon, and at bedtime. 07/26/23  Yes [provider]  traZODone (DESYREL) 50 MG tablet Take 50 mg by mouth at bedtime.   Yes [provider]  triamcinolone cream (KENALOG) 0.5 % Apply 1 Application topically 3 (three) times daily as needed (for itching or irritation- affected areas).   Yes [provider]  TYLENOL 500 MG tablet Take 500-1,000 mg by mouth every 6 (six) hours as needed for mild pain (pain score 1-3) or headache.   Yes [provider]  furosemide (LASIX) 20 MG tablet Take 1 tablet (20 mg total) by mouth every other day. Patient not taking: Reported on 08/01/2023 02/23/23 08/01/23  Narda Bonds, MD  glucose blood test strip Use as instructed 06/26/13   Doris Cheadle, MD  glucose monitoring kit (FREESTYLE) monitoring kit 1 each by Does not apply route as needed for other. 06/26/13   Doris Cheadle, MD  insulin glargine (SEMGLEE, YFGN,) 100 UNIT/ML Solostar Pen Inject 10 Units into the skin at bedtime. Patient not taking: Reported on 08/01/2023 03/23/23   Rhetta Mura, MD  levothyroxine (SYNTHROID) 50 MCG tablet Take 1 tablet (50 mcg total) by mouth daily at 6 (six) AM. Patient not taking: Reported on 03/22/2023 02/23/23   Narda Bonds, MD  venlafaxine XR (EFFEXOR-XR) 37.5 MG 24 hr capsule Take 37.5 mg by mouth daily. 06/27/23   [provider]    Past Medical History: Past Medical History:  Diagnosis Date   Anxiety and depression 06/26/2013   Cellulitis    Chronic kidney disease, stage 3a (HCC) 01/11/2023   Diabetes mellitus    Hyperlipidemia associated with type 2 diabetes mellitus (HCC) 05/02/2013   Hypertension associated with diabetes (HCC) 12/27/2020   Hypothyroidism    Infective endocarditis    PE (pulmonary embolism) ~2007-2008   Not on anticoagulation    Past Surgical History: Past Surgical History:  Procedure Laterality Date   Abscess removal from L groin     APPLICATION OF WOUND VAC  Left 05/01/2013   Procedure: APPLICATION OF WOUND VAC;  Surgeon: Kerrin Champagne, MD;  Location: [facility];  Service: Orthopedics;  Laterality: Left;   BELOW KNEE LEG AMPUTATION Right    CYSTOSCOPY W/ URETERAL STENT PLACEMENT Right 09/03/2021   Procedure: CYSTOSCOPY WITH RETROGRADE PYELOGRAM/URETERAL STENT PLACEMENT;  Surgeon: Marcine Matar, MD;  Location: [facility];  Service: Urology;  Laterality: Right;   I & D EXTREMITY  11/12/2011   Procedure: IRRIGATION AND DEBRIDEMENT EXTREMITY;  Surgeon: Kathryne Hitch, MD;  Location: [facility];  Service: Orthopedics;  Laterality: Left;  foot left   I & D EXTREMITY Left 09/06/2012   Procedure: IRRIGATION AND DEBRIDEMENT EXTREMITY;  Surgeon: Eldred Manges, MD;  Location: [facility];  Service: Orthopedics;  Laterality: Left;   I & D EXTREMITY Left 05/01/2013   Procedure: INCISION AND DRAINAGE LEFT FOREFOOT ABCESS ;  Surgeon: Kerrin Champagne, MD;  Location: [facility];  Service: Orthopedics;  Laterality: Left;   INCISION AND DRAINAGE ABSCESS Left 12/27/2020   Procedure: INCISION AND DRAINAGE LEFT THIGH ABSCESS;  Surgeon: Berna Bue, MD;  Location: [facility];  Service: General;  Laterality: Left;   IR FLUORO GUIDE CV LINE RIGHT  09/12/2021   IR PTA VENOUS EXCEPT DIALYSIS CIRCUIT  01/04/2021   IR RADIOLOGIST EVAL & MGMT  10/25/2021   IR US GUIDE VASC ACCESS RIGHT  09/12/2021   IR VENO/EXT/UNI RIGHT  01/04/2021   Surgery to remove hematoma in L leg     TEE WITHOUT CARDIOVERSION N/A 01/03/2021   Procedure: TRANSESOPHAGEAL ECHOCARDIOGRAM (TEE);  Surgeon: Quintella Reichert, MD;  Location: [facility] ENDOSCOPY;  Service: Cardiovascular;  Laterality: N/A;   TEE WITHOUT CARDIOVERSION N/A 03/23/2023   Procedure: TRANSESOPHAGEAL ECHOCARDIOGRAM;  Surgeon: [name], MD;  Location: [facility] INVASIVE CV LAB;  Service: Cardiovascular;  Laterality: N/A;   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    Family History: Family History  Problem Relation Age of Onset   Diabetes Mother     Coronary artery disease Mother    Stroke Mother     Social History: Social History   Socioeconomic History   Marital status: Single    Spouse name: Not on file   Number of children: Not on file   Years of education: Not on file   Highest education level: Not on file  Occupational History   Not on file  Tobacco Use   Smoking status: Some Days    Types: Cigarettes    Passive exposure: Never   Smokeless tobacco: Never  Vaping Use   Vaping status: Never Used  Substance and Sexual Activity   Alcohol use: No   Drug use: No   Sexual activity: Never  Other Topics Concern   Not on file  Social History Narrative   Lives at home on her own and is ambulatory, is single and has no children, no family who checks up on her or helps her, and only one friend who helps with daily activities when she can. She is a never smoker.    Social Drivers of Corporate investment banker Strain: Low Risk  (05/07/2023)   Received from [facility]   Overall Financial Resource Strain (CARDIA)    Difficulty of Paying Living Expenses: Not very hard  Recent Concern: Financial Resource Strain - High Risk (03/21/2023)   Received from [facility]   Overall Financial Resource Strain (CARDIA)    Difficulty of Paying Living Expenses: Very hard  Food Insecurity: No Food Insecurity (08/01/2023)   Hunger Vital Sign    Worried About Running Out of Food in the Last Year: Never true    Ran Out of Food in the Last Year: Never true  Transportation Needs: No Transportation Needs (08/01/2023)   PRAPARE - Administrator, Civil Service (Medical): No    Lack of Transportation (Non-Medical): No  Physical Activity: Sufficiently Active (05/07/2023)   Received [facility]   Exercise Vital Sign    Days of Exercise per Week: 4 days    Minutes of Exercise per Session: 60 min  Stress: Stress Concern Present (05/07/2023)   Received from [facility] -  Occupational Stress Questionnaire    Feeling of Stress : To some extent  Social Connections: Moderately Integrated (05/07/2023)   Received from [facility]   Social Network    How would you rate  your social network (family, work, friends)?: Adequate participation with social networks    Allergies:  Allergies  Allergen Reactions   Clindamycin Diarrhea and Nausea And Vomiting   Zofran Itching and Nausea And Vomiting   Doxycycline Diarrhea and Nausea And Vomiting   Morphine And Codeine Hives, Itching and Rash    Objective:    Vital Signs:   Temp:  [97.8 F (36.6 C)-98.5 F (36.9 C)] 97.8 F (36.6 C) (01/19 0731) Pulse Rate:  [57-89] 85 (01/19 0700) Resp:  [0-28] 19 (01/19 0700) BP: (119-148)/(52-72) 131/64 (01/19 0700) SpO2:  [84 %-97 %] 96 % (01/19 0700) Weight:  [201 kg] 201 kg (01/19 0500) Last BM Date : 08/08/23  Weight change: Filed Weights   08/10/23 0600 08/11/23 0500 08/12/23 0500  Weight: (!) 210.4 kg (!) 210.4 kg (!) 201 kg    Intake/Output:   Intake/Output Summary (Last 24 hours) at 08/12/2023 0832 Last data filed at 08/12/2023 1610 Gross per 24 hour  Intake 663.7 ml  Output 2150 ml  Net -1486.3 ml      Physical Exam    General:  Obese HEENT: normal Neck: supple. JVP 12. Carotids 2+ bilat; no bruits. No lymphadenopathy or thyromegaly appreciated. Cor: PMI nondisplaced. Regular rate & rhythm. 2/6 HSM LLSB. Lungs: clear Abdomen: soft, nontender, nondistended. No hepatosplenomegaly. No bruits or masses. Good bowel sounds. Extremities: Right BKA.  1+ edema to knee on left.  Neuro: alert & orientedx3, cranial nerves grossly intact. moves all 4 extremities w/o difficulty. Affect pleasant   Telemetry   NSR with PVCs (personally reviewed)  EKG    NSR, old lateral MI, RBBB (personally reviewed)  Labs   Basic Metabolic Panel: Recent Labs  Lab 08/06/23 0456 08/07/23 0507 08/08/23 0528 08/09/23 0515 08/10/23 0547 08/10/23 2041 08/11/23 0541  08/12/23 0358  NA 135 135 135 135 134* 134* 134* 136  K 3.0* 3.5 3.7 4.0 4.7 4.2 3.9 3.2*  CL 101 100 101 100 101 96* 98 98  CO2 26 28 27 25  21* 26 29 26   GLUCOSE 160* 163* 205* 146* 134* 167* 124* 122*  BUN 65* 70* 68* 74* 76* 84* 82* 77*  CREATININE 2.06* 2.20* 2.00* 2.02* 2.51* 2.74* 2.68* 2.59*  CALCIUM 8.5* 8.5* 8.7* 8.8* 8.8* 8.5* 8.6* 8.4*  MG 2.1 2.1 2.0  --   --   --   --  1.9  PHOS 4.9*  --   --   --   --   --   --   --     Liver Function Tests: Recent Labs  Lab 08/06/23 0456 08/11/23 0541  AST  --  11*  ALT  --  11  ALKPHOS  --  51  BILITOT  --  0.5  PROT  --  6.8  ALBUMIN 2.5* 2.6*   No results for input(s): "LIPASE", "AMYLASE" in the last 168 hours. No results for input(s): "AMMONIA" in the last 168 hours.  CBC: Recent Labs  Lab 08/06/23 0456 08/07/23 0507 08/08/23 0528 08/11/23 0541  WBC 5.8 5.3 5.1 5.2  HGB 11.5* 11.8* 11.5* 11.4*  HCT 35.4* 37.7 37.5 37.7  MCV 86.8 88.7 89.3 90.6  PLT 216 224 222 237    Cardiac Enzymes: No results for input(s): "CKTOTAL", "CKMB", "CKMBINDEX", "TROPONINI" in the last 168 hours.  BNP: BNP (last 3 results) Recent Labs    01/24/23 2225 02/09/23 1736 08/01/23 1140  BNP 321.0* 345.1* 523.3*    ProBNP (last 3 results) No results for input(s): "PROBNP" in  the last 8760 hours.   CBG: Recent Labs  Lab 08/11/23 0750 08/11/23 1232 08/11/23 1636 08/11/23 2026 08/12/23 0604  GLUCAP 109* 118* 109* 135* 120*    Coagulation Studies: No results for input(s): "LABPROT", "INR" in the last 72 hours.   Imaging   No results found.   Medications:     Current Medications:  apixaban  5 mg Oral BID   atorvastatin  40 mg Oral q1800   Chlorhexidine Gluconate Cloth  6 each Topical Daily   Gerhardt's butt cream   Topical TID   insulin aspart  0-15 Units Subcutaneous TID WC   insulin aspart  0-5 Units Subcutaneous QHS   insulin glargine-yfgn  10 Units Subcutaneous Daily   levothyroxine  75 mcg Oral Q0600    lidocaine  1 patch Transdermal Q24H   promethazine  25 mg Oral BID AC   sodium chloride flush  10-40 mL Intracatheter Q12H   venlafaxine XR  37.5 mg Oral Daily    Infusions:  famotidine (PEPCID) IV Stopped (08/11/23 2217)   furosemide Stopped (08/12/23 0049)   milrinone 0.25 mcg/kg/min (08/12/23 0741)   potassium chloride 10 mEq (08/12/23 0740)     Assessment/Plan   1. Acute on chronic systolic CHF with prominent RV dysfunction: I reviewed her echo today, EF 35-40% range appears globally hypokinetic to me with D-shaped septum and mild RV dilation/mild RV enlargement, PASP 70 mmHg, mild MR, severe TR, IVC dilated. She has lateral Qs on ECG, cannot rule out ischemic cardiomyopathy with LV dysfunction, but presentation seems predominantly RV failure.  Strong suspicion for OHS/OSA, but also has history of PE and cannot rule out chronic thromboembolic pulmonary hypertension (some question about medication compliance at home).  Not on CPAP at home.  Co-ox 48% yesterday, now up to 75% on milrinone 0.25.  She has diuresed this admission, complicated by cardiorenal syndrome.  Creatinine lower this morning on milrinone, 2.68 => 2.59.  CVP 12-13 on my read this morning.  - Continue milrinone 0.25 for RV support.  - Continue Lasix 120 mg IV bid and follow response, seems to be diuresing well currently.  - Weight has been prohibitive for cath lab, would be helpful to eventually get RHC/LHC if renal function stabilizes and weight comes down enough.  - With significant RV dysfunction, would ideally get V/Q scan for chronic PEs though not sure weight will allow.  - Unna boot for left leg.  2. H/o PE/DVT: See above concern for possible chronic thromboembolic PH.   - Continue Eliquis.  - Eventual V/Q scan would be ideal.  3. Suspect OHS/OSA: Will need outpatient sleep study.  May need home oxygen.  4. 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  Treated medically.  Echo this admission with mild MR.   5. AKI on CKD stage 3: Baseline creatinine around 1.7.  Creatinine 2.59 today, down from 2.68 yesterday.  6. H/o CVA: Right inferior cerebellar 8/24, possibly paradoxical embolization via PFO.  7. DM2: SSI.  8. Hypothyroidism: Levoxyl.   CRITICAL CARE Performed by: Marca Ancona  Total critical care time: 70 minutes  Critical care time was exclusive of separately billable procedures and treating other patients.  Critical care was necessary to treat or prevent imminent or life-threatening deterioration.  Critical care was time spent personally by me on the following activities: development of treatment plan with patient and/or surrogate as well as nursing, discussions with consultants, evaluation of patient's response to treatment, examination of patient, obtaining history from patient or  surrogate, ordering and performing treatments and interventions, ordering and review of laboratory studies, ordering and review of radiographic studies, pulse oximetry and re-evaluation of patient's condition.   Length of Stay: 38  Marca Ancona, MD  08/12/2023, 8:32 AM  Advanced Heart Failure Team Pager [phone] (M-F; 7a - 5p)  Please contact [facility] for night-coverage after hours (4p -7a ) and weekends on [email or web address]

## 2023-08-12 NOTE — Consult Note (Signed)
   NAME:  Karen Bennett, MRN:  [id number], DOB:  November 10, 1975, LOS: 11 ADMISSION DATE:  08/01/2023, CONSULTATION DATE:  08/11/23 REFERRING MD:  THR CHIEF COMPLAINT:  Heart Failure   History of Present Illness:  Karen Bennett is a 48 year old woman with history of DMII, CKDIIIa, hypertension, pulmonary embolus and morbid obesity who was admitted 1/8 for increasing lower extremity edema. Cardiology has been following and diuresing patient. She had rise in her Cr and reduced urine output. She had PICC line placed with Coox of 47% on 1/17. She was started on milrinone this morning by cardiology to further assist in diuresis. PCCM consulted.  She denies cough, dyspnea, fever or chills. She reports no issues since milrinone stated.   Echo 1/9 showed EF 35-40%.  Grade III diastolic dysfunction. RV systolic function is low normal. Severely elevated pulmonary artery pressures at .   Pertinent  Medical History   Past Medical History:  Diagnosis Date   Anxiety and depression 06/26/2013   Cellulitis    Chronic kidney disease, stage 3a (HCC) 01/11/2023   Diabetes mellitus    Hyperlipidemia associated with type 2 diabetes mellitus (HCC) 05/02/2013   Hypertension associated with diabetes (HCC) 12/27/2020   Hypothyroidism    Infective endocarditis    PE (pulmonary embolism) ~2007-2008   Not on anticoagulation    Significant Hospital Events: Including procedures, antibiotic start and stop dates in addition to other pertinent events   1/8 admitted to [facility] 1/18 PCCM consulted, started on milrinone, transfer to [facility] ICU requested  Interim History / Subjective:  No events. Diuresing with inotropes + lasix Denies pain Slept okay  Objective   Blood pressure 130/62, pulse 73, temperature 97.9 F (36.6 C), temperature source Oral, resp. rate 17, height 5\' 6"  (1.676 m), weight (!) 201 kg, last menstrual period 04/13/2013, SpO2 95%. CVP:  [12 mmHg-71 mmHg] 20 mmHg      Intake/Output Summary (Last 24  hours) at 08/12/2023 1226 Last data filed at 08/12/2023 1000 Gross per 24 hour  Intake 923.08 ml  Output 1650 ml  Net -726.92 ml   Filed Weights   08/10/23 0600 08/11/23 0500 08/12/23 0500  Weight: (!) 210.4 kg (!) 210.4 kg (!) 201 kg    Examination: No distress Marked anasarca Heart/lung sounds distant secondary to body habitus Aox3 Pleasant  Coox, BMP, echo reviewed   Resolved Hospital Problem list     Assessment & Plan:   Sequelae of longstanding obesity with predominantly RV-driven cardiogenic shock, renal dysfunction Pulmonary HTN, r/o CTEPH with hx of VTE on AC PTA Probable OSA/OHS  - Trial of BIPAP qHS - Check duplex - Remainder of care per [facility], discussed with them; they will take over as primary; I can work toward getting her a machine for home if she benefits from trial of using  Myrla Halsted MD PCCM

## 2023-08-12 NOTE — Progress Notes (Signed)
Pt stated she doesn't wear CPAP or Bipap at home.  Pt doesn't want tot wear BiPAP for the night. Pt on 2L [place] .

## 2023-08-12 NOTE — Plan of Care (Signed)
  Problem: Metabolic: Goal: Ability to maintain appropriate glucose levels will improve Outcome: Progressing   Problem: Nutritional: Goal: Maintenance of adequate nutrition will improve Outcome: Progressing   Problem: Skin Integrity: Goal: Risk for impaired skin integrity will decrease Outcome: Progressing   Problem: Tissue Perfusion: Goal: Adequacy of tissue perfusion will improve Outcome: Progressing   

## 2023-08-12 NOTE — Plan of Care (Signed)
  Problem: Health Behavior/Discharge Planning: Goal: Ability to identify and utilize available resources and services will improve Outcome: Progressing Goal: Ability to manage health-related needs will improve Outcome: Progressing   Problem: Nutritional: Goal: Maintenance of adequate nutrition will improve Outcome: Progressing   Problem: Skin Integrity: Goal: Risk for impaired skin integrity will decrease Outcome: Progressing   Problem: Tissue Perfusion: Goal: Adequacy of tissue perfusion will improve Outcome: Progressing   Problem: Health Behavior/Discharge Planning: Goal: Ability to manage health-related needs will improve Outcome: Progressing

## 2023-08-12 NOTE — Progress Notes (Signed)
Elink notified of patient potassium level

## 2023-08-12 NOTE — Progress Notes (Signed)
eLink Physician-Brief Progress Note Patient Name: Karen Bennett DOB: 26-Nov-1975 MRN: [id number]   Date of Service  08/12/2023  HPI/Events of Note  Hypokalemia  eICU Interventions  Replace   K+ 3.2   creat 2.59  Ical 1.18 Mag 1.9  Will give 20 of Kcl      Karen Bennett 08/12/2023, 5:56 AM

## 2023-08-13 ENCOUNTER — Inpatient Hospital Stay (HOSPITAL_COMMUNITY): Payer: 59

## 2023-08-13 ENCOUNTER — Encounter (HOSPITAL_COMMUNITY): Payer: Self-pay | Admitting: Radiology

## 2023-08-13 DIAGNOSIS — E119 Type 2 diabetes mellitus without complications: Secondary | ICD-10-CM | POA: Diagnosis not present

## 2023-08-13 DIAGNOSIS — I5033 Acute on chronic diastolic (congestive) heart failure: Secondary | ICD-10-CM | POA: Diagnosis not present

## 2023-08-13 DIAGNOSIS — R609 Edema, unspecified: Secondary | ICD-10-CM | POA: Diagnosis not present

## 2023-08-13 LAB — BASIC METABOLIC PANEL
Anion gap: 8 (ref 5–15)
BUN: 75 mg/dL — ABNORMAL HIGH (ref 6–20)
CO2: 29 mmol/L (ref 22–32)
Calcium: 8.5 mg/dL — ABNORMAL LOW (ref 8.9–10.3)
Chloride: 98 mmol/L (ref 98–111)
Creatinine, Ser: 2.46 mg/dL — ABNORMAL HIGH (ref 0.44–1.00)
GFR, Estimated: 24 mL/min — ABNORMAL LOW (ref >60)
Glucose, Bld: 137 mg/dL — ABNORMAL HIGH (ref 70–99)
Potassium: 3.2 mmol/L — ABNORMAL LOW (ref 3.5–5.1)
Sodium: 135 mmol/L (ref 135–145)

## 2023-08-13 LAB — COOXEMETRY PANEL
Carboxyhemoglobin: 1.7 % — ABNORMAL HIGH (ref 0.5–1.5)
Methemoglobin: <0.7 % (ref 0.0–1.5)
O2 Saturation: 78 %
Total hemoglobin: 10.9 g/dL — ABNORMAL LOW (ref 12.0–16.0)

## 2023-08-13 LAB — GLUCOSE, CAPILLARY
Glucose-Capillary: 125 mg/dL — ABNORMAL HIGH (ref 70–99)
Glucose-Capillary: 142 mg/dL — ABNORMAL HIGH (ref 70–99)
Glucose-Capillary: 147 mg/dL — ABNORMAL HIGH (ref 70–99)
Glucose-Capillary: 159 mg/dL — ABNORMAL HIGH (ref 70–99)

## 2023-08-13 LAB — POTASSIUM: Potassium: 3.7 mmol/L (ref 3.5–5.1)

## 2023-08-13 LAB — MAGNESIUM: Magnesium: 1.9 mg/dL (ref 1.7–2.4)

## 2023-08-13 MED ORDER — POTASSIUM CHLORIDE 10 MEQ/100ML IV SOLN
10.0000 meq | INTRAVENOUS | Status: DC
  Filled 2023-08-13 (×2): qty 100

## 2023-08-13 MED ORDER — FAMOTIDINE 20 MG PO TABS
20.0000 mg | ORAL_TABLET | Freq: Two times a day (BID) | ORAL | Status: DC
Start: 2023-08-13 — End: 2023-08-21
  Administered 2023-08-13 – 2023-08-21 (×17): 20 mg via ORAL
  Filled 2023-08-13 (×17): qty 1

## 2023-08-13 MED ORDER — MAGNESIUM SULFATE IN D5W 1-5 GM/100ML-% IV SOLN
1.0000 g | Freq: Once | INTRAVENOUS | Status: AC
  Administered 2023-08-13: 1 g via INTRAVENOUS
  Filled 2023-08-13: qty 100

## 2023-08-13 MED ORDER — POTASSIUM CHLORIDE 10 MEQ/50ML IV SOLN
10.0000 meq | INTRAVENOUS | Status: AC
  Administered 2023-08-13 (×2): 10 meq via INTRAVENOUS

## 2023-08-13 MED ORDER — POTASSIUM CHLORIDE CRYS ER 20 MEQ PO TBCR: 30.0000 meq | EXTENDED_RELEASE_TABLET | Freq: Two times a day (BID) | ORAL | Status: DC

## 2023-08-13 MED ORDER — MEXILETINE HCL 200 MG PO CAPS
200.0000 mg | ORAL_CAPSULE | Freq: Two times a day (BID) | ORAL | Status: DC
  Administered 2023-08-13 – 2023-08-16 (×7): 200 mg via ORAL
  Filled 2023-08-13 (×8): qty 1

## 2023-08-13 MED ORDER — SPIRONOLACTONE 12.5 MG HALF TABLET
12.5000 mg | ORAL_TABLET | Freq: Every day | ORAL | Status: DC
  Administered 2023-08-13 – 2023-08-21 (×9): 12.5 mg via ORAL
  Filled 2023-08-13 (×9): qty 1

## 2023-08-13 MED ORDER — POTASSIUM CHLORIDE CRYS ER 20 MEQ PO TBCR: 20.0000 meq | EXTENDED_RELEASE_TABLET | Freq: Two times a day (BID) | ORAL | Status: DC

## 2023-08-13 MED ORDER — POTASSIUM CHLORIDE CRYS ER 20 MEQ PO TBCR
20.0000 meq | EXTENDED_RELEASE_TABLET | Freq: Once | ORAL | Status: AC
  Administered 2023-08-13: 20 meq via ORAL
  Filled 2023-08-13: qty 1

## 2023-08-13 MED ORDER — POTASSIUM CHLORIDE 10 MEQ/50ML IV SOLN
10.0000 meq | INTRAVENOUS | Status: AC
  Administered 2023-08-13 (×2): 10 meq via INTRAVENOUS
  Filled 2023-08-13 (×4): qty 50

## 2023-08-13 MED ORDER — POTASSIUM CHLORIDE CRYS ER 20 MEQ PO TBCR
60.0000 meq | EXTENDED_RELEASE_TABLET | Freq: Once | ORAL | Status: AC
  Administered 2023-08-13: 60 meq via ORAL
  Filled 2023-08-13: qty 3

## 2023-08-13 NOTE — Progress Notes (Addendum)
Advanced Heart Failure Rounding Note  Cardiologist: Chilton Si, MD  Chief Complaint: Biventricular HF.  Subjective:   Admit weight 479--->440.   Diuresing with IV lasix + milrinone 0.25 mcg. CO-OX stable.    Denies SOB. Feels like she has more fluid.   Objective:   Weight Range: (!) 200 kg Body mass index is 71.17 kg/m.   Vital Signs:   Temp:  [97.7 F (36.5 C)-98.1 F (36.7 C)] 97.7 F (36.5 C) (01/20 0600) Pulse Rate:  [62-83] 80 (01/20 0700) Resp:  [12-32] 12 (01/20 0700) BP: (110-150)/(54-71) 125/66 (01/20 0700) SpO2:  [90 %-99 %] 95 % (01/20 0700) Weight:  [200 kg] 200 kg (01/20 0500) Last BM Date : 08/12/23  Weight change: Filed Weights   08/11/23 0500 08/12/23 0500 08/13/23 0500  Weight: (!) 210.4 kg (!) 201 kg (!) 200 kg    Intake/Output:   Intake/Output Summary (Last 24 hours) at 08/13/2023 0729 Last data filed at 08/13/2023 0600 Gross per 24 hour  Intake 794.19 ml  Output 1550 ml  Net -755.81 ml     CVP 8-9  Physical Exam    General:  In bed.  No resp difficulty HEENT: Normal Neck: Supple. JVP .8-9  Carotids 2+ bilat; no bruits. No lymphadenopathy or thyromegaly appreciated. Cor: PMI nondisplaced. Regular rate & rhythm. No rubs, gallops or murmurs. Lungs: Clear Abdomen: Soft, nontender, nondistended. No hepatosplenomegaly. No bruits or masses. Good bowel sounds. Extremities: No cyanosis, clubbing, rash, edema. R BKA . LLE 1-2+ edema.  Neuro: Alert & orientedx3, cranial nerves grossly intact. moves all 4 extremities w/o difficulty. Affect pleasant   Telemetry  SR with PVCs   EKG   N/A  Labs    CBC Recent Labs    08/11/23 0541  WBC 5.2  HGB 11.4*  HCT 37.7  MCV 90.6  PLT 237   Basic Metabolic Panel Recent Labs    29/52/84 0358 08/13/23 0512  NA 136 135  K 3.2* 3.2*  CL 98 98  CO2 26 29  GLUCOSE 122* 137*  BUN 77* 75*  CREATININE 2.59* 2.46*  CALCIUM 8.4* 8.5*  MG 1.9 1.9   Liver Function Tests Recent Labs     08/11/23 0541  AST 11*  ALT 11  ALKPHOS 51  BILITOT 0.5  PROT 6.8  ALBUMIN 2.6*   No results for input(s): "LIPASE", "AMYLASE" in the last 72 hours. Cardiac Enzymes No results for input(s): "CKTOTAL", "CKMB", "CKMBINDEX", "TROPONINI" in the last 72 hours.  BNP: BNP (last 3 results) Recent Labs    01/24/23 2225 02/09/23 1736 08/01/23 1140  BNP 321.0* 345.1* 523.3*    ProBNP (last 3 results) No results for input(s): "PROBNP" in the last 8760 hours.   D-Dimer No results for input(s): "DDIMER" in the last 72 hours. Hemoglobin A1C No results for input(s): "HGBA1C" in the last 72 hours. Fasting Lipid Panel No results for input(s): "CHOL", "HDL", "LDLCALC", "TRIG", "CHOLHDL", "LDLDIRECT" in the last 72 hours. Thyroid Function Tests No results for input(s): "TSH", "T4TOTAL", "T3FREE", "THYROIDAB" in the last 72 hours.  Invalid input(s): "FREET3"  Other results:   Imaging    No results found.   Medications:     Scheduled Medications:  apixaban  5 mg Oral BID   atorvastatin  40 mg Oral q1800   Chlorhexidine Gluconate Cloth  6 each Topical Daily   Gerhardt's butt cream   Topical TID   insulin aspart  0-15 Units Subcutaneous TID WC   insulin aspart  0-5  Units Subcutaneous QHS   insulin glargine-yfgn  10 Units Subcutaneous Daily   levothyroxine  75 mcg Oral Q0600   lidocaine  1 patch Transdermal Q24H   promethazine  25 mg Oral BID AC   sodium chloride flush  10-40 mL Intracatheter Q12H   venlafaxine XR  37.5 mg Oral Daily    Infusions:  famotidine (PEPCID) IV Stopped (08/13/23 0005)   furosemide Stopped (08/12/23 2212)   magnesium sulfate bolus IVPB 1 g (08/13/23 0704)   milrinone 0.25 mcg/kg/min (08/13/23 0600)   potassium chloride      PRN Medications: acetaminophen **OR** acetaminophen, albuterol, docusate sodium, mouth rinse, mouth rinse, oxyCODONE, sodium chloride flush, traZODone    Patient Profile    48 y.o. 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    Assessment/Plan  1. Acute on chronic Biventricular Heart Failure.  Echo  EF 35-40% range appears globally hypokinetic to me with D-shaped septum and mild RV dilation/mild RV enlargement, PASP 70 mmHg, mild MR, severe TR, IVC dilated. She has lateral Qs on ECG, cannot rule out ischemic cardiomyopathy with LV dysfunction, but presentation seems predominantly RV failure.  Strong suspicion for OHS/OSA, but also has history of PE and cannot rule out chronic thromboembolic pulmonary hypertension (some question about medication compliance at home).  Not on CPAP at home.  CO-OX 78%.  Continue milrinone 0.25 for RV support.  - CVP 8-9. Continue IV lasix. 120 mg twice a day. Supp K and given 2 grams Mag.  - Not a candidate for SGLT2i body habitus and previous infections.  - With significant RV dysfunction, needs V/Q scan for chronic PEs though not sure weight will allow.  - Unna boot for left leg.  2. H/o PE/DVT: See above concern for possible chronic thromboembolic PH.   - Continue Eliquis.  - Eventual V/Q scan would be ideal.  3. Suspect OHS/OSA: Will need outpatient sleep study.  May need home oxygen.  Refused CPAP last night.  4. 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  Treated medically.  Echo this admission with mild MR.  5. AKI on CKD stage 3: Baseline creatinine around 1.7.  Creatinine peaked at 2.7-->2.46  6. H/o CVA: Right inferior cerebellar 8/24, possibly paradoxical embolization via PFO.  7. DM2: SSI.  8. Hypothyroidism: Levoxyl.TSH ok.  9. Tobacco Abuse Discussed cessation.   Consult PT. Non weight bearing.   SDOH- lives alone. Has Personal Care services daily at home. Uses [facility] for appointments.     Length of Stay:  12  Tonye Becket, NP  08/13/2023, 7:29 AM  Advanced Heart Failure Team Pager [phone] (M-F; 7a - 5p)  Please contact [facility] for night-coverage after hours (5p -7a ) and weekends on [email or web address]   Patient seen and examined with the above-signed Advanced Practice Provider and/or Housestaff. I personally reviewed laboratory data, imaging studies and relevant notes. I independently examined the patient and formulated the important aspects of the plan. I have edited the note to reflect any of my changes or salient points. I have personally discussed the plan with the patient and/or family.  Diuresing with lasix and milrinone. Weight down 39 pounds. Co-ox 78%  CVP 8-9  Denies CP or SOB  General:  Sitting up in bed No resp difficulty HEENT: normal Neck:  supple. Unable to see JVP due to size Cor: Regular rate & rhythm.2/6 TR Lungs: clear Abdomen: markedly obese soft, nontender, nondistended. No hepatosplenomegaly. No bruits or masses. Good bowel sounds. Extremities: no cyanosis, clubbing, rash, 2+ edema R BKA Neuro: alert & orientedx3, cranial nerves grossly intact. moves all 4 extremities w/o difficulty. Affect pleasant  Frequent PVCs on tele  She has severe cor pulmonale likely due to OSA/OHS. Volume status much impvoed with milrinone and IV lasix. CVP < 10. May be as good as we can get her with RV dysfunction. Will continue diuresis as BP and Scr tolerate.   VQ unlikely to be helpful given size and pre-existing need for chronic anticoagulation.   Can consider RHC to clearly assess hemodynamics.   Supp K/Mg. Add spiro 12.5 at least while in hospital.   Start mexilitene for PVCs  [facility] go to [facility] service if bed available.   Arvilla Meres, MD  8:07 AM

## 2023-08-13 NOTE — Progress Notes (Signed)
Bilateral lower extremity venous duplex has been completed. Preliminary results can be found in CV Proc through chart review.   08/13/23 1:04 PM Olen Jaffe RVT

## 2023-08-13 NOTE — TOC Initial Note (Signed)
Transition of Care [facility] - Initial/Assessment Note    Patient Details  Name: RHONNA DRUGAN MRN: [id number] Date of Birth: Feb 22, 1976  Transition of Care [facility]) CM/SW Contact:    Elliot Cousin, RN Phone Number: [phone] 08/13/2023, 12:17 PM  Clinical Narrative:                 CM spoke to pt at bedside. States she lives independently but gets help Mon-Frid. She has a HH aide that comes 6-8 hours per day.  Discussed [employer] system, states she can purchase online or set up for a meals on wheels program.  Will continue to follow for dc needs.   Expected Discharge Plan: Home/Self Care Barriers to Discharge: Continued Medical Work up   Patient Goals and CMS Choice Patient states their goals for this hospitalization and ongoing recovery are:: wants to be able to manage her care at home   Choice offered to / list presented to : Patient [place] ownership interest in [facility].provided to:: Patient    Expected Discharge Plan and Services   Discharge Planning Services: CM Consult   Living arrangements for the past 2 months: Apartment                                      Prior Living Arrangements/Services Living arrangements for the past 2 months: Apartment Lives with:: Self Patient language and need for interpreter reviewed:: Yes Do you feel safe going back to the place where you live?: Yes      Need for Family Participation in Patient Care: Yes (Comment) Care giver support system in place?: Yes (comment) Current home services: DME, Homehealth aide (electric scooter, bedside commode, shower chair, aide Mon-Fri (6-8)) Criminal Activity/Legal Involvement Pertinent to Current Situation/Hospitalization: No - Comment as needed  Activities of Daily Living   ADL Screening (condition at time of admission) Independently performs ADLs?: No Does the patient have a NEW difficulty with bathing/dressing/toileting/self-feeding that is expected to last >3  days?: No Does the patient have a NEW difficulty with getting in/out of bed, walking, or climbing stairs that is expected to last >3 days?: No Does the patient have a NEW difficulty with communication that is expected to last >3 days?: No Is the patient deaf or have difficulty hearing?: No Does the patient have difficulty seeing, even when wearing glasses/contacts?: No Does the patient have difficulty concentrating, remembering, or making decisions?: No  Permission Sought/Granted Permission sought to share information with : Case Manager, Family Supports, PCP Permission granted to share information with : Yes, Verbal Permission Granted  Share Information with NAME: Kennieth Rad     Permission granted to share info w Relationship: friend  Permission granted to share info w Contact Information: [phone]  Emotional Assessment Appearance:: Appears stated age Attitude/Demeanor/Rapport: Engaged Affect (typically observed): Accepting Orientation: : Oriented to Self, Oriented to Place, Oriented to  Time, Oriented to Situation   Psych Involvement: No (comment)  Admission diagnosis:  Acute diastolic (congestive) heart failure (HCC) [I50.31] Patient Active Problem List   Diagnosis Date Noted   Acute diastolic (congestive) heart failure (HCC) 08/01/2023   Acute CVA (cerebrovascular accident) (HCC) 03/23/2023   Aphasia 03/21/2023   QT prolongation 03/21/2023   CKD (chronic kidney disease) 02/09/2023   Paraplegia (HCC) 02/09/2023   Acute pulmonary embolism (HCC) 01/25/2023   Acute on chronic heart failure with preserved ejection fraction (HFpEF) (HCC) 01/11/2023  Chronic kidney disease, stage 3a (HCC) 01/11/2023   Acute pain of right lower extremity 07/04/2022   History of femur fracture 02/23/2022   Bacteremia 02/22/2022   Hypothyroidism    Hyperglycemia 10/24/2021   Hx of bacterial endocarditis 10/24/2021   Pyuria 10/24/2021   Candida rash of groin 09/11/2021   Pressure injury of  back, stage 2 (HCC) 09/05/2021   Obstruction of right ureteropelvic junction (UPJ) due to stone 09/04/2021   Sepsis secondary to UTI (HCC) 09/03/2021   Diabetic hyperosmolar non-ketotic state (HCC) 09/03/2021   Stenosis of subclavian artery (HCC) 02/09/2021   Sepsis (HCC) 02/01/2021   Mixed hyperlipidemia 01/12/2021   Infective endocarditis    Pressure injury of skin 12/28/2020   BMI 60.0-69.9, adult (HCC) 12/27/2020   Hypertension associated with diabetes (HCC) 12/27/2020   History of pulmonary embolism 12/27/2020   Necrotizing fasciitis of lower leg (HCC) Thigh  12/27/2020   Cellulitis 12/26/2020   History of DVT (deep vein thrombosis) 11/12/2020   Tobacco abuse 12/31/2017   Hx of right BKA (HCC) 12/31/2017   Closed fracture of right distal femur (HCC) 12/31/2017   Hirsutism 06/21/2016   Noncompliance with treatment plan 04/19/2015   Anxiety and depression 06/26/2013   Normocytic anemia 05/22/2013   Depression 05/22/2013   Grief reaction 05/22/2013   AKI (acute kidney injury) (HCC) 05/07/2013   Phantom limb pain (HCC) 05/06/2013   Hyperlipidemia associated with type 2 diabetes mellitus (HCC) 05/02/2013   Hypokalemia 05/01/2013   Diabetic peripheral neuropathy associated with type 2 diabetes mellitus (HCC) 04/30/2013    Class: Chronic   MSSA (methicillin susceptible Staphylococcus aureus) 09/06/2012   Foot osteomyelitis, left (HCC) 09/03/2012   Pulmonary emboli (HCC) 11/11/2011   Hyponatremia 09/05/2011   Diabetic foot ulcer (HCC) 09/05/2011   Obesity, Class III, BMI 40-49.9 (morbid obesity) (HCC) 08/15/2011   Insulin dependent type 2 diabetes mellitus (HCC) 06/12/2007   PCP:  [name], NP Pharmacy:   [id number] - [place], [place] - [address] AT [facility] OF [address] [place] [address] Phone: [phone] Fax: [phone]     Social Drivers of Health (SDOH) Social History: SDOH Screenings   Food  Insecurity: No Food Insecurity (08/01/2023)  Housing: Low Risk  (08/01/2023)  Transportation Needs: No Transportation Needs (08/01/2023)  Utilities: Not At Risk (08/01/2023)  Financial Resource Strain: Low Risk  (05/07/2023)   Received from [facility]  Recent Concern: Financial Resource Strain - High Risk (03/21/2023)   Received from [facility]  Physical Activity: Sufficiently Active (05/07/2023)   Received from [facility]  Social Connections: Moderately Integrated (05/07/2023)   Received from [facility]  Stress: Stress Concern Present (05/07/2023)   Received from [facility]  Tobacco Use: High Risk (08/02/2023)   SDOH Interventions:     Readmission Risk Interventions    08/02/2023    1:28 PM 02/13/2023   11:23 AM 01/26/2023    3:33 PM  Readmission Risk Prevention Plan  Transportation Screening Complete Complete Complete  PCP or Specialist Appt within 3-5 Days  Complete Complete  HRI or Home Care Consult  Complete Complete  Social Work Consult for Recovery Care Planning/Counseling  Complete Complete  Palliative Care Screening  Complete Not Applicable  Medication Review Oceanographer) Complete Complete Complete  PCP or Specialist appointment within 3-5 days of discharge Complete    HRI or Home Care Consult Complete    SW Recovery Care/Counseling Consult Complete    Palliative Care Screening Not Applicable  Skilled Nursing Facility Not Applicable

## 2023-08-13 NOTE — Progress Notes (Addendum)
eLink Physician-Brief Progress Note Patient Name: Karen Bennett DOB: 07/10/1976 MRN: [id number]   Date of Service  08/13/2023  HPI/Events of Note   K+ 3.2 w/ creat 2.46 and GFR 24 CHF Mag 1.9   eICU Interventions  Kcl 10 meq, every hour for 4 hrs and klor con 20 meq once and 1 gm mag ordered     Intervention Category Intermediate Interventions: Electrolyte abnormality - evaluation and management  Ranee Gosselin 08/13/2023, 6:38 AM

## 2023-08-13 NOTE — Consult Note (Addendum)
   NAME:  Karen Bennett, MRN:  [id number], DOB:  [birth year], LOS: 12 ADMISSION DATE:  08/01/2023, CONSULTATION DATE:  08/11/23 REFERRING MD:  THR CHIEF COMPLAINT:  Heart Failure   History of Present Illness:  Karen Bennett is a 48 year old woman with history of DMII, CKDIIIa, hypertension, pulmonary embolus and morbid obesity who was admitted 1/8 for increasing lower extremity edema. Cardiology has been following and diuresing patient. She had rise in her Cr and reduced urine output. She had PICC line placed with Coox of 47% on 1/17. She was started on milrinone this morning by cardiology to further assist in diuresis. PCCM consulted.  She denies cough, dyspnea, fever or chills. She reports no issues since milrinone stated.   Echo 1/9 showed EF 35-40%.  Grade III diastolic dysfunction. RV systolic function is low normal. Severely elevated pulmonary artery pressures at .   Pertinent  Medical History   Past Medical History:  Diagnosis Date   Anxiety and depression 06/26/2013   Cellulitis    Chronic kidney disease, stage 3a (HCC) 01/11/2023   Diabetes mellitus    Hyperlipidemia associated with type 2 diabetes mellitus (HCC) 05/02/2013   Hypertension associated with diabetes (HCC) 12/27/2020   Hypothyroidism    Infective endocarditis    PE (pulmonary embolism) ~2007-2008   Not on anticoagulation    Significant Hospital Events: Including procedures, antibiotic start and stop dates in addition to other pertinent events   1/8 admitted to [facility] 1/18 PCCM consulted, started on milrinone, transfer to [facility] ICU requested  Interim History / Subjective:  No events. Feeling better slowly.  Objective   Blood pressure 125/66, pulse 80, temperature 97.7 F (36.5 C), temperature source Oral, resp. rate (!) 26, height 5\' 6"  (1.676 m), weight (!) 200 kg, last menstrual period 04/13/2013, SpO2 97%. CVP:  [8 mmHg-74 mmHg] 8 mmHg      Intake/Output Summary (Last 24 hours) at 08/13/2023 0828 Last  data filed at 08/13/2023 0600 Gross per 24 hour  Intake 638.16 ml  Output 1550 ml  Net -911.84 ml   Filed Weights   08/11/23 0500 08/12/23 0500 08/13/23 0500  Weight: (!) 210.4 kg (!) 201 kg (!) 200 kg    Examination: No distress Stable anasarca Lungs diminished Moves to command Aox3  Potassium being repleted Coox okay LE duplex pending  Resolved Hospital Problem list     Assessment & Plan:   Sequelae of longstanding obesity with predominantly RV-driven cardiogenic shock, renal dysfunction Pulmonary HTN, r/o CTEPH with hx of VTE on AC PTA Probable OSA/OHS DM2  - Trial of BIPAP at bedtime: RT reminded to offer this - f/u duplex - continue PTA mexilitine - PT/OT - glargine and SSI - Push diuresis; fixed dose milrinone - Eventual L/R HC if can get under table limit - Continue AC  Stable for progressive appreciate TRH taking back over starting 08/14/23  Myrla Halsted MD PCCM

## 2023-08-13 NOTE — Plan of Care (Signed)
  Problem: Skin Integrity: Goal: Risk for impaired skin integrity will decrease Outcome: Progressing   Problem: Tissue Perfusion: Goal: Adequacy of tissue perfusion will improve Outcome: Progressing   Problem: Education: Goal: Knowledge of General Education information will improve Description: Including pain rating scale, medication(s)/side effects and non-pharmacologic comfort measures Outcome: Progressing

## 2023-08-13 NOTE — Progress Notes (Signed)
Apparently did not wear CPAP, will ask RT to try again tonight.  Myrla Halsted MD PCCM

## 2023-08-14 DIAGNOSIS — I5033 Acute on chronic diastolic (congestive) heart failure: Secondary | ICD-10-CM | POA: Diagnosis not present

## 2023-08-14 LAB — BASIC METABOLIC PANEL
Anion gap: 8 (ref 5–15)
Anion gap: 10 (ref 5–15)
BUN: 70 mg/dL — ABNORMAL HIGH (ref 6–20)
BUN: 65 mg/dL — ABNORMAL HIGH (ref 6–20)
CO2: 29 mmol/L (ref 22–32)
CO2: 25 mmol/L (ref 22–32)
Calcium: 8.2 mg/dL — ABNORMAL LOW (ref 8.9–10.3)
Calcium: 8 mg/dL — ABNORMAL LOW (ref 8.9–10.3)
Chloride: 98 mmol/L (ref 98–111)
Chloride: 93 mmol/L — ABNORMAL LOW (ref 98–111)
Creatinine, Ser: 2.24 mg/dL — ABNORMAL HIGH (ref 0.44–1.00)
Creatinine, Ser: 2.06 mg/dL — ABNORMAL HIGH (ref 0.44–1.00)
GFR, Estimated: 27 mL/min — ABNORMAL LOW (ref >60)
GFR, Estimated: 30 mL/min — ABNORMAL LOW (ref >60)
Glucose, Bld: 121 mg/dL — ABNORMAL HIGH (ref 70–99)
Glucose, Bld: 379 mg/dL — ABNORMAL HIGH (ref 70–99)
Potassium: 3.5 mmol/L (ref 3.5–5.1)
Potassium: 3.6 mmol/L (ref 3.5–5.1)
Sodium: 135 mmol/L (ref 135–145)
Sodium: 128 mmol/L — ABNORMAL LOW (ref 135–145)

## 2023-08-14 LAB — COOXEMETRY PANEL
Carboxyhemoglobin: 1.1 % (ref 0.5–1.5)
Methemoglobin: 0.8 % (ref 0.0–1.5)
O2 Saturation: 65.1 %
Total hemoglobin: 9.4 g/dL — ABNORMAL LOW (ref 12.0–16.0)

## 2023-08-14 LAB — GLUCOSE, CAPILLARY
Glucose-Capillary: 107 mg/dL — ABNORMAL HIGH (ref 70–99)
Glucose-Capillary: 137 mg/dL — ABNORMAL HIGH (ref 70–99)
Glucose-Capillary: 140 mg/dL — ABNORMAL HIGH (ref 70–99)
Glucose-Capillary: 166 mg/dL — ABNORMAL HIGH (ref 70–99)

## 2023-08-14 LAB — MAGNESIUM: Magnesium: 1.9 mg/dL (ref 1.7–2.4)

## 2023-08-14 MED ORDER — POTASSIUM CHLORIDE CRYS ER 20 MEQ PO TBCR
60.0000 meq | EXTENDED_RELEASE_TABLET | Freq: Once | ORAL | Status: AC
  Administered 2023-08-14: 60 meq via ORAL
  Filled 2023-08-14: qty 3

## 2023-08-14 MED ORDER — METOLAZONE 2.5 MG PO TABS
2.5000 mg | ORAL_TABLET | Freq: Once | ORAL | Status: AC
  Administered 2023-08-14: 2.5 mg via ORAL
  Filled 2023-08-14: qty 1

## 2023-08-14 MED ORDER — FUROSEMIDE 10 MG/ML IJ SOLN
30.0000 mg/h | INTRAVENOUS | Status: DC
  Administered 2023-08-14 – 2023-08-18 (×15): 30 mg/h via INTRAVENOUS
  Filled 2023-08-14 (×16): qty 20

## 2023-08-14 MED ORDER — POTASSIUM CHLORIDE CRYS ER 20 MEQ PO TBCR
40.0000 meq | EXTENDED_RELEASE_TABLET | Freq: Once | ORAL | Status: AC
  Administered 2023-08-14: 40 meq via ORAL
  Filled 2023-08-14: qty 2

## 2023-08-14 MED ORDER — METOLAZONE 2.5 MG PO TABS
2.5000 mg | ORAL_TABLET | Freq: Once | ORAL | Status: AC
Start: 2023-08-14 — End: 2023-08-14
  Administered 2023-08-14: 2.5 mg via ORAL
  Filled 2023-08-14: qty 1

## 2023-08-14 MED ORDER — CAMPHOR-MENTHOL 0.5-0.5 % EX LOTN: TOPICAL_LOTION | Freq: Two times a day (BID) | CUTANEOUS | Status: DC | PRN

## 2023-08-14 MED ORDER — FUROSEMIDE 10 MG/ML IJ SOLN
160.0000 mg | Freq: Two times a day (BID) | INTRAVENOUS | Status: DC
  Administered 2023-08-14: 160 mg via INTRAVENOUS
  Filled 2023-08-14: qty 10
  Filled 2023-08-14: qty 16

## 2023-08-14 MED ORDER — MUPIROCIN 2 % EX OINT
1.0000 | TOPICAL_OINTMENT | Freq: Two times a day (BID) | CUTANEOUS | Status: AC
  Administered 2023-08-14 – 2023-08-19 (×10): 1 via NASAL
  Filled 2023-08-14 (×2): qty 22

## 2023-08-14 MED ORDER — MAGNESIUM SULFATE 2 GM/50ML IV SOLN
2.0000 g | Freq: Once | INTRAVENOUS | Status: AC
  Administered 2023-08-14: 2 g via INTRAVENOUS
  Filled 2023-08-14: qty 50

## 2023-08-14 NOTE — Plan of Care (Signed)
  Problem: Nutrition: Goal: Adequate nutrition will be maintained Outcome: Progressing   Problem: Coping: Goal: Level of anxiety will decrease Outcome: Progressing   Problem: Elimination: Goal: Will not experience complications related to bowel motility Outcome: Progressing Goal: Will not experience complications related to urinary retention Outcome: Progressing   Problem: Pain Management: Goal: General experience of comfort will improve Outcome: Progressing   Problem: Safety: Goal: Ability to remain free from injury will improve Outcome: Progressing   Problem: Skin Integrity: Goal: Risk for impaired skin integrity will decrease Outcome: Progressing

## 2023-08-14 NOTE — Progress Notes (Signed)
Orthopedic Tech Progress Note Patient Details:  Karen Bennett April 10, 1976 [id number]  Ortho Devices Type of Ortho Device: Ace wrap, Unna boot Ortho Device/Splint Location: LLE Ortho Device/Splint Interventions: Ordered, Application, Adjustment   Post Interventions Patient Tolerated: Well Instructions Provided: Care of device  Donald Pore 08/14/2023, 3:26 PM

## 2023-08-14 NOTE — Progress Notes (Addendum)
Set up pt for min +8/max 20 w/2 PS. Attempted nasal CPAP min +6 w/5 min ramp. Pt unable to tolerated any adjustment to minimums due to anxiety. We made several attempts but within a minute or so pt asked to remove it. Pt placed on 2L [place] for overnight. Pt will try again if necessary before discharge and formal sleep study +/or CPAP titration. Unit remains @bedside . Pt was extremely compliant. Pt stated she has anxiety @baseline  and was apologetic. Good pt effort noted.

## 2023-08-14 NOTE — Evaluation (Signed)
Physical Therapy Evaluation Patient Details Name: Karen Bennett MRN: [id number] DOB: 09-Dec-1975 Today's Date: 08/14/2023  History of Present Illness  Pt is a 48 y.o. female admitted 08/01/23 due to edema, SOB, PND and abdominal distention.  Started on diuretics, then transferred [facility] to [facility] 08/11/23 for starting milrinone to aide in diuresis due to elevated pulmonary pressures and Coox 47%.  PMH significant for DM2, HTN, HLD, hypothyroidism, CKD IIIa, obesity, Rt BKA, PE on Xarelto, DVT, endocarditis, cellulitis, lumbar radiculopathy, anxiety, depression, CVA.  Clinical Impression  Patient presents with decreased mobility due to decreased strength, decreased activity tolerance, decreased balance and prolonged bedrest.  She is mobile at home up to power chair via scooting transfers and has PCA worker 6-8 hours M-F.  She was limited this session due to fear and stating still with so much fluid that makes it difficult to move.  Initiated education on importance of moving despite fluid even if needing help due to impact of bedrest on mobility.  She participated in mobility up to long sitting using rails without help and mobilized all 4 extremities even with resistance with UE.  She seems motivated and does not want to go to a "nursing home" as had poor experience previously.  Feel she will benefit from skilled PT in the acute setting and follow up inpatient rehab (>3 hours/day) prior to d/c home.         If plan is discharge home, recommend the following: Assistance with cooking/housework;Assist for transportation;Two people to help with walking and/or transfers;Two people to help with bathing/dressing/bathroom   Can travel by private vehicle        Equipment Recommendations None recommended by PT  Recommendations for Other Services       Functional Status Assessment Patient has had a recent decline in their functional status and demonstrates the ability to make significant improvements in function  in a reasonable and predictable amount of time.     Precautions / Restrictions Precautions Precautions: Fall Precaution Comments: R BKA, pt reports is NWB due to spinter femur fx in 2018 Restrictions RLE Weight Bearing Per Provider Order: Non weight bearing      Mobility  Bed Mobility Overal bed mobility: Needs Assistance Bed Mobility: Supine to Sit     Supine to sit: Used rails, Supervision     General bed mobility comments: pulls up into long sitting in bed with rails with S; declined EOB as concerned she wouldn't be able to do it at home.  Discussed having help to try but pt opted for in bed therex this session    Transfers                   General transfer comment: declined    Ambulation/Gait                  Stairs            Wheelchair Mobility     Tilt Bed    Modified Rankin (Stroke Patients Only)       Balance Overall balance assessment: Needs assistance   Sitting balance-Leahy Scale: Fair Sitting balance - Comments: up in long sitting initially with UE support on rails, then sat 10 seconds without UE support                                     Pertinent Vitals/Pain Pain Assessment Pain Assessment: No/denies pain  Home Living Family/patient expects to be discharged to:: Private residence Living Arrangements: Alone Available Help at Discharge: Personal care attendant Type of Home: Apartment Home Access: Level entry       Home Layout: One level Home Equipment: Wheelchair - power;BSC/3in1;Hand held shower head;Grab bars - tub/shower Additional Comments: PCA 6-8 hours/day M-F takes SCAT to appointments    Prior Function Prior Level of Function : Needs assist             Mobility Comments: power chair for mobility, transfers unaided per pt ADLs Comments: aid helps with cooking and cleaning tasks IADLs, otherwise Mod Ind with ADLs     Extremity/Trunk Assessment   Upper Extremity Assessment Upper  Extremity Assessment: Overall WFL for tasks assessed    Lower Extremity Assessment Lower Extremity Assessment: RLE deficits/detail;LLE deficits/detail RLE Deficits / Details: cannot lift from hip antigravity, flexed knee on her own LLE Deficits / Details: AAROM WFL (as much as able with body habitus); strength grossly 3-/5; edema in LE's wearing unna boot on R LE       Communication   Communication Communication: No apparent difficulties  Cognition Arousal: Alert Behavior During Therapy: WFL for tasks assessed/performed Overall Cognitive Status: Within Functional Limits for tasks assessed                                          General Comments General comments (skin integrity, edema, etc.): VSS on RA throughout    Exercises General Exercises - Upper Extremity Elbow Extension: Strengthening, Both, 20 reps, Supine, Theraband Theraband Level (Elbow Extension): Level 3 (Green) General Exercises - Lower Extremity Ankle Circles/Pumps: AROM, Left, 10 reps, Supine Heel Slides: AROM, Right, 10 reps, Supine Hip ABduction/ADduction: Strengthening, Both, 5 reps, Supine (adductor squeezes x 10 sec hold) Straight Leg Raises: AROM, Strengthening, Right, 5 reps, Supine   Assessment/Plan    PT Assessment Patient needs continued PT services  PT Problem List Decreased strength;Decreased mobility;Cardiopulmonary status limiting activity;Decreased activity tolerance;Decreased knowledge of use of DME       PT Treatment Interventions DME instruction;Functional mobility training;Patient/family education;Therapeutic activities;Therapeutic exercise;Balance training    PT Goals (Current goals can be found in the Care Plan section)  Acute Rehab PT Goals Patient Stated Goal: return home PT Goal Formulation: With patient Time For Goal Achievement: 08/28/23 Potential to Achieve Goals: Good    Frequency Min 1X/week     Co-evaluation               AM-PAC PT "6 Clicks"  Mobility  Outcome Measure Help needed turning from your back to your side while in a flat bed without using bedrails?: Total Help needed moving from lying on your back to sitting on the side of a flat bed without using bedrails?: Total Help needed moving to and from a bed to a chair (including a wheelchair)?: Total Help needed standing up from a chair using your arms (e.g., wheelchair or bedside chair)?: Total Help needed to walk in hospital room?: Total Help needed climbing 3-5 steps with a railing? : Total 6 Click Score: 6    End of Session Equipment Utilized During Treatment: Oxygen Activity Tolerance: Patient tolerated treatment well Patient left: in bed;with call bell/phone within reach   PT Visit Diagnosis: Other abnormalities of gait and mobility (R26.89);Muscle weakness (generalized) (M62.81)    Time: 1610-9604 PT Time Calculation (min) (ACUTE ONLY): 26 min   Charges:  PT Evaluation $PT Eval Moderate Complexity: 1 Mod PT Treatments $Therapeutic Activity: 8-22 mins PT General Charges $$ ACUTE PT VISIT: 1 Visit         Sheran Lawless, PT Acute Rehabilitation Services Office:[phone] 08/14/2023   Karen Bennett 08/14/2023, 5:21 PM

## 2023-08-14 NOTE — Progress Notes (Addendum)
Advanced Heart Failure Rounding Note  Cardiologist: Chilton Si, MD  Chief Complaint: Biventricular HF.  Subjective:   Admit weight 479--->440.   Transferred to from ICU yesterday.  Coox 65%. CVP 14-15. Net negative 500cc with IV Lasix 120 mg bid + Milrinone 0.25. Weight inaccurate from yesterday. Cr continues to slowly trending down. 2.46>2.24.  Objective:    Weight Range: (!) 160.3 kg Body mass index is 57.04 kg/m.   Vital Signs:   Temp:  [97.6 F (36.4 C)-98.1 F (36.7 C)] 98.1 F (36.7 C) (01/21 0726) Pulse Rate:  [80-92] 81 (01/21 0406) Resp:  [13-26] 13 (01/21 0726) BP: (93-146)/(48-75) 122/61 (01/21 0726) SpO2:  [90 %-97 %] 92 % (01/21 0726) Weight:  [160.3 kg] 160.3 kg (01/21 0500) Last BM Date : 08/13/23  Weight change: Filed Weights   08/12/23 0500 08/13/23 0500 08/14/23 0500  Weight: (!) 201 kg (!) 200 kg (!) 160.3 kg   Intake/Output:   Intake/Output Summary (Last 24 hours) at 08/14/2023 0736 Last data filed at 08/14/2023 0438 Gross per 24 hour  Intake 863.81 ml  Output 1350 ml  Net -486.19 ml    Physical Exam    CVP 14-15 General: Weak, pale appearing. No distress on RA HEENT: neck supple.   Cardiac: JVP ~12cm. S1 and S2 present. No murmurs or rub. Resp: Lung sounds coarse in uppers, diminished in bases Abdomen: Obese, Soft, non-tender, non-distended. + BS. Extremities: Warm and dry. No rash, cyanosis.  3+ peripheral edema. R AKA Neuro: Alert and oriented x3. Affect pleasant. Moves all extremities without difficulty. Lines/Devices:   RUE PICC  Telemetry   Sinus in 90s with frequent PVCs, Bigeminy intermittently this am (personally reviewed)  EKG    No new EKG to review  Labs    CBC No results for input(s): "WBC", "NEUTROABS", "HGB", "HCT", "MCV", "PLT" in the last 72 hours.  Basic Metabolic Panel Recent Labs    82/95/62 0512 08/13/23 2030 08/14/23 0430  NA 135  --  135  K 3.2* 3.7 3.5  CL 98  --  98  CO2 29  --  29   GLUCOSE 137*  --  121*  BUN 75*  --  70*  CREATININE 2.46*  --  2.24*  CALCIUM 8.5*  --  8.2*  MG 1.9  --  1.9   Liver Function Tests No results for input(s): "AST", "ALT", "ALKPHOS", "BILITOT", "PROT", "ALBUMIN" in the last 72 hours.  No results for input(s): "LIPASE", "AMYLASE" in the last 72 hours. Cardiac Enzymes No results for input(s): "CKTOTAL", "CKMB", "CKMBINDEX", "TROPONINI" in the last 72 hours.  BNP: BNP (last 3 results) Recent Labs    01/24/23 2225 02/09/23 1736 08/01/23 1140  BNP 321.0* 345.1* 523.3*    ProBNP (last 3 results) No results for input(s): "PROBNP" in the last 8760 hours.   D-Dimer No results for input(s): "DDIMER" in the last 72 hours. Hemoglobin A1C No results for input(s): "HGBA1C" in the last 72 hours. Fasting Lipid Panel No results for input(s): "CHOL", "HDL", "LDLCALC", "TRIG", "CHOLHDL", "LDLDIRECT" in the last 72 hours. Thyroid Function Tests No results for input(s): "TSH", "T4TOTAL", "T3FREE", "THYROIDAB" in the last 72 hours.  Invalid input(s): "FREET3"  Other results:   Imaging    VAS [place] LOWER EXTREMITY VENOUS (DVT) Result Date: 08/13/2023  Lower Venous DVT Study Patient Name:  Karen Bennett  Date of Exam:   08/13/2023 Medical Rec #: [id number]        Accession #:  [id number] Date of Birth: Oct 29, 1975       Patient Gender: F Patient Age:   48 years Exam Location:  [facility] Procedure:      VAS [place] LOWER EXTREMITY VENOUS (DVT) Referring Phys: Levon Hedger --------------------------------------------------------------------------------  Indications: Edema.  Risk Factors: None identified. Limitations: Body habitus, poor ultrasound/tissue interface and patient positioning. Comparison Study: No prior studies. Performing Technologist: Chanda Busing RVT  Examination Guidelines: A complete evaluation includes B-mode imaging, spectral Doppler, color Doppler, and power Doppler as needed of all accessible portions of each vessel.  Bilateral testing is considered an integral part of a complete examination. Limited examinations for reoccurring indications may be performed as noted. The reflux portion of the exam is performed with the patient in reverse Trendelenburg.  +---------+---------------+---------+-----------+----------+-------------------+ RIGHT    CompressibilityPhasicitySpontaneityPropertiesThrombus Aging      +---------+---------------+---------+-----------+----------+-------------------+ CFV      Full           Yes      Yes                                      +---------+---------------+---------+-----------+----------+-------------------+ SFJ      Full                                                             +---------+---------------+---------+-----------+----------+-------------------+ FV Prox  Full                                                             +---------+---------------+---------+-----------+----------+-------------------+ FV Mid                  Yes      Yes                                      +---------+---------------+---------+-----------+----------+-------------------+ FV Distal               Yes      Yes                                      +---------+---------------+---------+-----------+----------+-------------------+ PFV                                                   Not well visualized +---------+---------------+---------+-----------+----------+-------------------+ POP      Full           Yes      Yes                                      +---------+---------------+---------+-----------+----------+-------------------+ PTV  Not well visualized +---------+---------------+---------+-----------+----------+-------------------+ PERO                                                  Not well visualized +---------+---------------+---------+-----------+----------+-------------------+    +---------+---------------+---------+-----------+----------+-------------------+ LEFT     CompressibilityPhasicitySpontaneityPropertiesThrombus Aging      +---------+---------------+---------+-----------+----------+-------------------+ CFV      Full           Yes      Yes                                      +---------+---------------+---------+-----------+----------+-------------------+ SFJ      Full                                                             +---------+---------------+---------+-----------+----------+-------------------+ FV Prox  Full                                                             +---------+---------------+---------+-----------+----------+-------------------+ FV Mid                  Yes      Yes                                      +---------+---------------+---------+-----------+----------+-------------------+ FV Distal               Yes      Yes                                      +---------+---------------+---------+-----------+----------+-------------------+ PFV                                                   Not well visualized +---------+---------------+---------+-----------+----------+-------------------+ POP      Full           Yes      Yes                                      +---------+---------------+---------+-----------+----------+-------------------+ PTV      Full                                                             +---------+---------------+---------+-----------+----------+-------------------+ PERO  Not well visualized +---------+---------------+---------+-----------+----------+-------------------+     Summary: RIGHT: - There is no evidence of deep vein thrombosis in the lower extremity. However, portions of this examination were limited- see technologist comments above.  - No cystic structure found in the popliteal fossa.  LEFT: - There is  no evidence of deep vein thrombosis in the lower extremity. However, portions of this examination were limited- see technologist comments above.  - No cystic structure found in the popliteal fossa.  *See table(s) above for measurements and observations. Electronically signed by Carolynn Sayers on 08/13/2023 at 2:05:27 PM.    Final     Medications:    Scheduled Medications:  apixaban  5 mg Oral BID   atorvastatin  40 mg Oral q1800   Chlorhexidine Gluconate Cloth  6 each Topical Daily   famotidine  20 mg Oral BID   Gerhardt's butt cream   Topical TID   insulin aspart  0-15 Units Subcutaneous TID WC   insulin aspart  0-5 Units Subcutaneous QHS   insulin glargine-yfgn  10 Units Subcutaneous Daily   levothyroxine  75 mcg Oral Q0600   lidocaine  1 patch Transdermal Q24H   mexiletine  200 mg Oral Q12H   potassium chloride  60 mEq Oral Once   promethazine  25 mg Oral BID AC   sodium chloride flush  10-40 mL Intracatheter Q12H   spironolactone  12.5 mg Oral Daily   venlafaxine XR  37.5 mg Oral Daily    Infusions:  furosemide Stopped (08/13/23 2214)   milrinone 0.25 mcg/kg/min (08/14/23 0438)    PRN Medications: acetaminophen **OR** acetaminophen, albuterol, docusate sodium, mouth rinse, mouth rinse, oxyCODONE, sodium chloride flush, traZODone  Patient Profile   48 y.o. 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    Assessment/Plan   1. Acute on chronic Biventricular Heart Failure.  Echo  EF 35-40% range appears globally hypokinetic to me with D-shaped septum and mild RV dilation/mild RV enlargement, PASP 70 mmHg, mild MR, severe TR, IVC dilated. She has lateral Qs on ECG, cannot rule out ischemic cardiomyopathy with LV dysfunction, but presentation  seems predominantly RV failure.  Strong suspicion for OHS/OSA, but also has history of PE and cannot rule out chronic thromboembolic pulmonary hypertension (some question about medication compliance at home).  Not on CPAP at home.  - Co-ox 65.  Continue milrinone 0.25 for RV support and to assist with volume removal. - CVP 14-15. Increase IV lasix 160 mg bid. Given 1 dose Metolazone 2.5 mg today. Supp K. - Not a candidate for SGLT2i body habitus and previous infections.  - With significant RV dysfunction, V/Q unlikely given weight - Unna boot for left leg.  2. H/o PE/DVT: See above concern for possible chronic thromboembolic PH.   - Continue Eliquis.  - DVT studies 1/20 negative  - Eventual V/Q scan would be ideal.  3. Suspect OHS/OSA: Will need outpatient sleep study.  May need home oxygen.  Refused CPAP last night.  4. 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  Treated medically.  Echo this admission with mild MR.  5. AKI on CKD stage 3: Baseline creatinine around 1.7.  Creatinine peaked at 2.7-->2.24 6. H/o CVA: Right inferior cerebellar 8/24, possibly paradoxical embolization via PFO.  7. DM2: SSI.  8.  Hypothyroidism: Levoxyl.TSH ok.  9. Tobacco Abuse Discussed cessation.  10. PVCs: on mexiletine 200 mg bid  Consult PT. Non weight bearing.   SDOH- lives alone. Has Personal Care services daily at home. Uses SCAT for appointments.    Length of Stay: 76  [place] Lee, NP  08/14/2023, 7:36 AM  Advanced Heart Failure Team Pager [phone] (M-F; 7a - 5p)  Please contact [facility] for night-coverage after hours (5p -7a ) and weekends on [email or web address]  Patient seen and examined with the above-signed Advanced Practice Provider and/or Housestaff. I personally reviewed laboratory data, imaging studies and relevant notes. I independently examined the patient and formulated the important aspects of the plan. I have edited the note to reflect any of my changes or salient points. I have  personally discussed the plan with the patient and/or family.  Remains on milrinone 0.25. Co-ox 65%. CVP 15 Modest diuresis on lasix 120 IV bid  Feels bloated and swollen. Denies SOB.  General:  Lying in bed  No resp difficulty HEENT: normal Neck: supple.JVP to jaw . Carotids 2+ bilat; no bruits. No lymphadenopathy or thryomegaly appreciated. Cor: Regular rate & rhythm. 2/6 TR Lungs: clear Abdomen: obese soft, nontender,No bruits or masses. Good bowel sounds. Extremities: no cyanosis, clubbing, rash, 3+ edema  s/p L RBKA Neuro: alert & orientedx3, cranial nerves grossly intact. moves all 4 extremities w/o difficulty. Affect pleasant  She has marked RV failure with massive volume overload. Only modest diuresis with milrinone support and bid lasix  Switch to lasix gtt at 30. Continue milrinone. May need repeat RHC.   Arvilla Meres, MD  3:00 PM

## 2023-08-14 NOTE — TOC Progression Note (Signed)
Transition of Care [facility]) - Progression Note    Patient Details  Name: TEKAYLA WICKSON MRN: [id number] Date of Birth: [birth year]  Transition of Care [facility]) CM/SW Contact  Ronna Herskowitz, Adria Devon, RN Phone Number: 08/14/2023, 3:15 PM  Clinical Narrative:     Vaughan Basta with [employer] reviewing chart for BiPAP   Expected Discharge Plan: Home/Self Care Barriers to Discharge: Continued Medical Work up  Expected Discharge Plan and Services   Discharge Planning Services: CM Consult   Living arrangements for the past 2 months: Apartment                                       Social Determinants of Health (SDOH) Interventions SDOH Screenings   Food Insecurity: No Food Insecurity (08/01/2023)  Housing: Low Risk  (08/01/2023)  Transportation Needs: No Transportation Needs (08/01/2023)  Utilities: Not At Risk (08/01/2023)  Financial Resource Strain: Low Risk  (05/07/2023)   Received from [facility]  Recent Concern: Financial Resource Strain - High Risk (03/21/2023)   Received from [facility]  Physical Activity: Sufficiently Active (05/07/2023)   Received from [facility]  Social Connections: Moderately Integrated (05/07/2023)   Received from [facility]  Stress: Stress Concern Present (05/07/2023)   Received from [facility]  Tobacco Use: High Risk (08/02/2023)    Readmission Risk Interventions    08/02/2023    1:28 PM 02/13/2023   11:23 AM 01/26/2023    3:33 PM  Readmission Risk Prevention Plan  Transportation Screening Complete Complete Complete  PCP or Specialist Appt within 3-5 Days  Complete Complete  HRI or Home Care Consult  Complete Complete  Social Work Consult for Recovery Care Planning/Counseling  Complete Complete  Palliative Care Screening  Complete Not Applicable  Medication Review Oceanographer) Complete Complete Complete  PCP or Specialist appointment within 3-5 days of discharge Complete    HRI or Home Care Consult Complete    SW Recovery  Care/Counseling Consult Complete    Palliative Care Screening Not Applicable    Skilled Nursing Facility Not Applicable

## 2023-08-14 NOTE — TOC Progression Note (Signed)
Transition of Care [facility]) - Progression Note    Patient Details  Name: Karen Bennett MRN: [id number] Date of Birth: [birth year]  Transition [facility]) CM/SW Contact  Reva Bores, LCSWA Phone Number:[phone] 08/14/2023, 2:52 PM  Clinical Narrative:   HF CSW notified NCM about home bipap being ordered per Dr. Jomarie Longs.    TOC will continue following.     Expected Discharge Plan: Home/Self Care Barriers to Discharge: Continued Medical Work up  Expected Discharge Plan and Services   Discharge Planning Services: CM Consult   Living arrangements for the past 2 months: Apartment                                       Social Determinants of Health (SDOH) Interventions SDOH Screenings   Food Insecurity: No Food Insecurity (08/01/2023)  Housing: Low Risk  (08/01/2023)  Transportation Needs: No Transportation Needs (08/01/2023)  Utilities: Not At Risk (08/01/2023)  Financial Resource Strain: Low Risk  (05/07/2023)   Received from [facility]  Recent Concern: Financial Resource Strain - High Risk (03/21/2023)   Received from [facility]  Physical Activity: Sufficiently Active (05/07/2023)   Received from [facility]  Social Connections: Moderately Integrated (05/07/2023)   Received from [facility]  Stress: Stress Concern Present (05/07/2023)   Received from [facility]  Tobacco Use: High Risk (08/02/2023)    Readmission Risk Interventions    08/02/2023    1:28 PM 02/13/2023   11:23 AM 01/26/2023    3:33 PM  Readmission Risk Prevention Plan  Transportation Screening Complete Complete Complete  PCP or Specialist Appt within 3-5 Days  Complete Complete  HRI or Home Care Consult  Complete Complete  Social Work Consult for Recovery Care Planning/Counseling  Complete Complete  Palliative Care Screening  Complete Not Applicable  Medication Review Oceanographer) Complete Complete Complete  PCP or Specialist appointment within 3-5 days of discharge Complete     HRI or Home Care Consult Complete    SW Recovery Care/Counseling Consult Complete    Palliative Care Screening Not Applicable    Skilled Nursing Facility Not Applicable

## 2023-08-14 NOTE — Plan of Care (Signed)
  Problem: Education: Goal: Individualized Educational Video(s) Outcome: Progressing   Problem: Health Behavior/Discharge Planning: Goal: Ability to identify and utilize available resources and services will improve Outcome: Progressing Goal: Ability to manage health-related needs will improve Outcome: Progressing   Problem: Metabolic: Goal: Ability to maintain appropriate glucose levels will improve Outcome: Progressing   Problem: Nutritional: Goal: Maintenance of adequate nutrition will improve Outcome: Progressing Goal: Progress toward achieving an optimal weight will improve Outcome: Progressing   Problem: Skin Integrity: Goal: Risk for impaired skin integrity will decrease Outcome: Progressing   Problem: Tissue Perfusion: Goal: Adequacy of tissue perfusion will improve Outcome: Progressing   Problem: Education: Goal: Knowledge of General Education information will improve Description: Including pain rating scale, medication(s)/side effects and non-pharmacologic comfort measures Outcome: Progressing   Problem: Health Behavior/Discharge Planning: Goal: Ability to manage health-related needs will improve Outcome: Progressing   Problem: Clinical Measurements: Goal: Ability to maintain clinical measurements within normal limits will improve Outcome: Progressing Goal: Will remain free from infection Outcome: Progressing Goal: Diagnostic test results will improve Outcome: Progressing Goal: Respiratory complications will improve Outcome: Progressing Goal: Cardiovascular complication will be avoided Outcome: Progressing   Problem: Activity: Goal: Risk for activity intolerance will decrease Outcome: Progressing   Problem: Nutrition: Goal: Adequate nutrition will be maintained Outcome: Progressing   Problem: Coping: Goal: Level of anxiety will decrease Outcome: Progressing   Problem: Elimination: Goal: Will not experience complications related to bowel  motility Outcome: Progressing Goal: Will not experience complications related to urinary retention Outcome: Progressing   Problem: Pain Management: Goal: General experience of comfort will improve Outcome: Progressing   Problem: Safety: Goal: Ability to remain free from injury will improve Outcome: Progressing   Problem: Skin Integrity: Goal: Risk for impaired skin integrity will decrease Outcome: Progressing   Problem: Education: Goal: Knowledge of General Education information will improve Description: Including pain rating scale, medication(s)/side effects and non-pharmacologic comfort measures Outcome: Progressing   Problem: Health Behavior/Discharge Planning: Goal: Ability to manage health-related needs will improve Outcome: Progressing   Problem: Clinical Measurements: Goal: Ability to maintain clinical measurements within normal limits will improve Outcome: Progressing Goal: Will remain free from infection Outcome: Progressing Goal: Diagnostic test results will improve Outcome: Progressing Goal: Respiratory complications will improve Outcome: Progressing Goal: Cardiovascular complication will be avoided Outcome: Progressing   Problem: Activity: Goal: Risk for activity intolerance will decrease Outcome: Progressing   Problem: Nutrition: Goal: Adequate nutrition will be maintained Outcome: Progressing   Problem: Coping: Goal: Level of anxiety will decrease Outcome: Progressing   Problem: Elimination: Goal: Will not experience complications related to bowel motility Outcome: Progressing Goal: Will not experience complications related to urinary retention Outcome: Progressing   Problem: Pain Managment: Goal: General experience of comfort will improve and/or be controlled Outcome: Progressing   Problem: Safety: Goal: Ability to remain free from injury will improve Outcome: Progressing   Problem: Skin Integrity: Goal: Risk for impaired skin integrity  will decrease Outcome: Progressing   Problem: Education: Goal: Individualized Educational Video(s) Outcome: Progressing

## 2023-08-14 NOTE — Progress Notes (Signed)
PROGRESS NOTE    Karen Bennett  ZOX:[id number] DOB: [birth year] DOA: 08/01/2023 PCP: [name], NP  48/F with history of DVT/PE, chronic systolic CHF, super morbid obesity, CVA, CKD 3 A, BKA, hypothyroidism, history of Candida endocarditis in 6/22 presented to the ED with progressive edema and dyspnea.  On admission echo noted EF 35-40%, global hypokinesis, mildly reduced RV, severe LA elevated PASP -Started on diuretics, creatinine worsened, transferred to [facility], initial Co. ox was 48%, started on milrinone 1/19 -Heart failure team following -PCCM to [facility] service from PCCM today 1/21   Subjective: -Feels better overall, breathing improving, used BiPAP for a bit yesterday  Assessment and Plan:  Acute on chronic biventricular failure Severe pulmonary hypertension -Echo this admission noted EF 35-40%, mildly reduced RV, severely elevated PA systolic pressures, 70 mmHg, severe TR -Predominantly RV failure at this time -Continue Lasix and milrinone -Poor candidate for SGLT2i -Add BiPAP nightly, TOC consult for home BiPAP  AKI on CKD 3 A Baseline creatinine around 1.6-1.8 -Peaked at 2.7 this admission, now improving on milrinone  History of PVCs On mexiletine  History of DVT/PE -Continue Eliquis, Dopplers negative this admission  OSA/OHS -Encouraged use of nightly BiPAP, TOC consult for this  Type 2 diabetes mellitus -CBGs are stable, continue glargine, recent A1c was 7.2  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 -Treated medically  History of CVA -Continue Eliquis  Hypothyroidism Continue Synthroid, TSH minimally elevated will need repeat in 6 weeks  Right BKA   DVT prophylaxis: Eliquis Code Status: Full code Family Communication: None Disposition Plan: Home likely 2 to 3 days  Consultants:    Procedures:   Antimicrobials:    Objective: Vitals:   08/13/23 2328 08/14/23 0406 08/14/23 0500 08/14/23 0726  BP: 135/67 114/62  122/61  Pulse: 90  81    Resp: 15 16 15 13   Temp: 97.6 F (36.4 C) 98.1 F (36.7 C)  98.1 F (36.7 C)  TempSrc: Oral Oral  Oral  SpO2: 94% 90%  92%  Weight:   (!) 160.3 kg   Height:        Intake/Output Summary (Last 24 hours) at 08/14/2023 0917 Last data filed at 08/14/2023 8119 Gross per 24 hour  Intake 716.54 ml  Output 1350 ml  Net -633.46 ml   Filed Weights   08/12/23 0500 08/13/23 0500 08/14/23 0500  Weight: (!) 201 kg (!) 200 kg (!) 160.3 kg    Examination:  General exam: Morbidly obese chronically ill female sitting up in bed, AAOx3 HEENT: Neck obese unable to assess JVD CVS: S1-S2, regular rhythm Lungs: Decreased breath sounds at the bases Abdomen: Soft, nontender, bowel sounds present Remedies: Right BKA, 1-2+ edema on the left  Psychiatry:  Mood & affect appropriate.     Data Reviewed:   CBC: Recent Labs  Lab 08/08/23 0528 08/11/23 0541  WBC 5.1 5.2  HGB 11.5* 11.4*  HCT 37.5 37.7  MCV 89.3 90.6  PLT 222 237   Basic Metabolic Panel: Recent Labs  Lab 08/08/23 0528 08/09/23 0515 08/10/23 2041 08/11/23 0541 08/12/23 0358 08/13/23 0512 08/13/23 2030 08/14/23 0430  NA 135   < > 134* 134* 136 135  --  135  K 3.7   < > 4.2 3.9 3.2* 3.2* 3.7 3.5  CL 101   < > 96* 98 98 98  --  98  CO2 27   < > 26 29 26 29   --  29  GLUCOSE 205*   < > 167* 124*  122* 137*  --  121*  BUN 68*   < > 84* 82* 77* 75*  --  70*  CREATININE 2.00*   < > 2.74* 2.68* 2.59* 2.46*  --  2.24*  CALCIUM 8.7*   < > 8.5* 8.6* 8.4* 8.5*  --  8.2*  MG 2.0  --   --   --  1.9 1.9  --  1.9   < > = values in this interval not displayed.   GFR: Estimated Creatinine Clearance: 49.4 mL/min (A) (by C-G formula based on SCr of 2.24 mg/dL (H)). Liver Function Tests: Recent Labs  Lab 08/11/23 0541  AST 11*  ALT 11  ALKPHOS 51  BILITOT 0.5  PROT 6.8  ALBUMIN 2.6*   No results for input(s): "LIPASE", "AMYLASE" in the last 168 hours. No results for input(s): "AMMONIA" in the last 168 hours. Coagulation  Profile: No results for input(s): "INR", "PROTIME" in the last 168 hours. Cardiac Enzymes: No results for input(s): "CKTOTAL", "CKMB", "CKMBINDEX", "TROPONINI" in the last 168 hours. BNP (last 3 results) No results for input(s): "PROBNP" in the last 8760 hours. HbA1C: No results for input(s): "HGBA1C" in the last 72 hours. CBG: Recent Labs  Lab 08/13/23 0610 08/13/23 1228 08/13/23 1614 08/13/23 2125 08/14/23 0614  GLUCAP 125* 142* 147* 159* 107*   Lipid Profile: No results for input(s): "CHOL", "HDL", "LDLCALC", "TRIG", "CHOLHDL", "LDLDIRECT" in the last 72 hours. Thyroid Function Tests: No results for input(s): "TSH", "T4TOTAL", "FREET4", "T3FREE", "THYROIDAB" in the last 72 hours. Anemia Panel: No results for input(s): "VITAMINB12", "FOLATE", "FERRITIN", "TIBC", "IRON", "RETICCTPCT" in the last 72 hours. Urine analysis:    Component Value Date/Time   COLORURINE AMBER (A) 03/22/2023 0426   APPEARANCEUR TURBID (A) 03/22/2023 0426   LABSPEC 1.040 (H) 03/22/2023 0426   PHURINE 5.0 03/22/2023 0426   GLUCOSEU >=500 (A) 03/22/2023 0426   HGBUR LARGE (A) 03/22/2023 0426   BILIRUBINUR NEGATIVE 03/22/2023 0426   KETONESUR NEGATIVE 03/22/2023 0426   PROTEINUR >=300 (A) 03/22/2023 0426   UROBILINOGEN 0.2 09/05/2011 0558   NITRITE POSITIVE (A) 03/22/2023 0426   LEUKOCYTESUR MODERATE (A) 03/22/2023 0426   Sepsis Labs: @LABRCNTIP (procalcitonin:4,lacticidven:4)  ) Recent Results (from the past 240 hours)  MRSA Next Gen by PCR, Nasal     Status: Abnormal   Collection Time: 08/10/23  4:15 PM   Specimen: Nasal Mucosa; Nasal Swab  Result Value Ref Range Status   MRSA by PCR Next Gen DETECTED (A) NOT DETECTED Final    Comment: CRITICAL RESULT CALLED TO, READ BACK BY AND VERIFIED WITH: Tamera Reason, RN @ 2015 08/10/23 MH (NOTE) The GeneXpert MRSA Assay (FDA approved for NASAL specimens only), is one component of a comprehensive MRSA colonization surveillance program. It is not  intended to diagnose MRSA infection nor to guide or monitor treatment for MRSA infections. Test performance is not FDA approved in patients less than 78 years old. Performed at [facility], [address] [place], [place] [address]      Radiology Studies: VAS [place] LOWER EXTREMITY VENOUS (DVT) Result Date: 08/13/2023  Lower Venous DVT Study Patient Name:  Karen Bennett  Date of Exam:   08/13/2023 Medical Rec #: [id number]        Accession #:    [id number] Date of Birth: June 26, 1976       Patient Gender: F Patient Age:   75 years Exam Location:  [facility] Procedure:      VAS [place] LOWER EXTREMITY VENOUS (DVT)  Referring Phys: Levon Hedger --------------------------------------------------------------------------------  Indications: Edema.  Risk Factors: None identified. Limitations: Body habitus, poor ultrasound/tissue interface and patient positioning. Comparison Study: No prior studies. Performing Technologist: Chanda Busing RVT  Examination Guidelines: A complete evaluation includes B-mode imaging, spectral Doppler, color Doppler, and power Doppler as needed of all accessible portions of each vessel. Bilateral testing is considered an integral part of a complete examination. Limited examinations for reoccurring indications may be performed as noted. The reflux portion of the exam is performed with the patient in reverse Trendelenburg.  +---------+---------------+---------+-----------+----------+-------------------+ RIGHT    CompressibilityPhasicitySpontaneityPropertiesThrombus Aging      +---------+---------------+---------+-----------+----------+-------------------+ CFV      Full           Yes      Yes                                      +---------+---------------+---------+-----------+----------+-------------------+ SFJ      Full                                                              +---------+---------------+---------+-----------+----------+-------------------+ FV Prox  Full                                                             +---------+---------------+---------+-----------+----------+-------------------+ FV Mid                  Yes      Yes                                      +---------+---------------+---------+-----------+----------+-------------------+ FV Distal               Yes      Yes                                      +---------+---------------+---------+-----------+----------+-------------------+ PFV                                                   Not well visualized +---------+---------------+---------+-----------+----------+-------------------+ POP      Full           Yes      Yes                                      +---------+---------------+---------+-----------+----------+-------------------+ PTV                                                   Not well visualized +---------+---------------+---------+-----------+----------+-------------------+ PERO  Not well visualized +---------+---------------+---------+-----------+----------+-------------------+   +---------+---------------+---------+-----------+----------+-------------------+ LEFT     CompressibilityPhasicitySpontaneityPropertiesThrombus Aging      +---------+---------------+---------+-----------+----------+-------------------+ CFV      Full           Yes      Yes                                      +---------+---------------+---------+-----------+----------+-------------------+ SFJ      Full                                                             +---------+---------------+---------+-----------+----------+-------------------+ FV Prox  Full                                                             +---------+---------------+---------+-----------+----------+-------------------+ FV  Mid                  Yes      Yes                                      +---------+---------------+---------+-----------+----------+-------------------+ FV Distal               Yes      Yes                                      +---------+---------------+---------+-----------+----------+-------------------+ PFV                                                   Not well visualized +---------+---------------+---------+-----------+----------+-------------------+ POP      Full           Yes      Yes                                      +---------+---------------+---------+-----------+----------+-------------------+ PTV      Full                                                             +---------+---------------+---------+-----------+----------+-------------------+ PERO                                                  Not well visualized +---------+---------------+---------+-----------+----------+-------------------+     Summary: RIGHT: - There is no evidence of deep vein thrombosis in the lower extremity. However,  portions of this examination were limited- see technologist comments above.  - No cystic structure found in the popliteal fossa.  LEFT: - There is no evidence of deep vein thrombosis in the lower extremity. However, portions of this examination were limited- see technologist comments above.  - No cystic structure found in the popliteal fossa.  *See table(s) above for measurements and observations. Electronically signed by Carolynn Sayers on 08/13/2023 at 2:05:27 PM.    Final      Scheduled Meds:  apixaban  5 mg Oral BID   atorvastatin  40 mg Oral q1800   Chlorhexidine Gluconate Cloth  6 each Topical Daily   famotidine  20 mg Oral BID   Gerhardt's butt cream   Topical TID   insulin aspart  0-15 Units Subcutaneous TID WC   insulin aspart  0-5 Units Subcutaneous QHS   insulin glargine-yfgn  10 Units Subcutaneous Daily   levothyroxine  75 mcg Oral Q0600    lidocaine  1 patch Transdermal Q24H   mexiletine  200 mg Oral Q12H   potassium chloride  40 mEq Oral Once   promethazine  25 mg Oral BID AC   sodium chloride flush  10-40 mL Intracatheter Q12H   spironolactone  12.5 mg Oral Daily   venlafaxine XR  37.5 mg Oral Daily   Continuous Infusions:  furosemide     magnesium sulfate bolus IVPB 2 g (08/14/23 0848)   milrinone 0.25 mcg/kg/min (08/14/23 0844)     LOS: 13 days    Time spent:    Zannie Cove, MD [facility]   08/14/2023, 9:17 AM

## 2023-08-15 DIAGNOSIS — Z8673 Personal history of transient ischemic attack (TIA), and cerebral infarction without residual deficits: Secondary | ICD-10-CM

## 2023-08-15 DIAGNOSIS — E1169 Type 2 diabetes mellitus with other specified complication: Secondary | ICD-10-CM | POA: Diagnosis not present

## 2023-08-15 DIAGNOSIS — Z86711 Personal history of pulmonary embolism: Secondary | ICD-10-CM | POA: Diagnosis not present

## 2023-08-15 DIAGNOSIS — N179 Acute kidney failure, unspecified: Secondary | ICD-10-CM | POA: Diagnosis not present

## 2023-08-15 DIAGNOSIS — I5023 Acute on chronic systolic (congestive) heart failure: Secondary | ICD-10-CM | POA: Diagnosis not present

## 2023-08-15 LAB — GLUCOSE, CAPILLARY
Glucose-Capillary: 123 mg/dL — ABNORMAL HIGH (ref 70–99)
Glucose-Capillary: 116 mg/dL — ABNORMAL HIGH (ref 70–99)
Glucose-Capillary: 119 mg/dL — ABNORMAL HIGH (ref 70–99)
Glucose-Capillary: 145 mg/dL — ABNORMAL HIGH (ref 70–99)
Glucose-Capillary: 145 mg/dL — ABNORMAL HIGH (ref 70–99)

## 2023-08-15 LAB — COOXEMETRY PANEL
Carboxyhemoglobin: 1.7 % — ABNORMAL HIGH (ref 0.5–1.5)
Methemoglobin: <0.7 % (ref 0.0–1.5)
O2 Saturation: 64.9 %
Total hemoglobin: 10.7 g/dL — ABNORMAL LOW (ref 12.0–16.0)

## 2023-08-15 LAB — BASIC METABOLIC PANEL
Anion gap: 11 (ref 5–15)
Anion gap: 7 (ref 5–15)
BUN: 66 mg/dL — ABNORMAL HIGH (ref 6–20)
BUN: 58 mg/dL — ABNORMAL HIGH (ref 6–20)
CO2: 27 mmol/L (ref 22–32)
CO2: 27 mmol/L (ref 22–32)
Calcium: 8.4 mg/dL — ABNORMAL LOW (ref 8.9–10.3)
Calcium: 7.6 mg/dL — ABNORMAL LOW (ref 8.9–10.3)
Chloride: 95 mmol/L — ABNORMAL LOW (ref 98–111)
Chloride: 94 mmol/L — ABNORMAL LOW (ref 98–111)
Creatinine, Ser: 2.14 mg/dL — ABNORMAL HIGH (ref 0.44–1.00)
Creatinine, Ser: 1.84 mg/dL — ABNORMAL HIGH (ref 0.44–1.00)
GFR, Estimated: 28 mL/min — ABNORMAL LOW (ref >60)
GFR, Estimated: 34 mL/min — ABNORMAL LOW (ref >60)
Glucose, Bld: 186 mg/dL — ABNORMAL HIGH (ref 70–99)
Glucose, Bld: 388 mg/dL — ABNORMAL HIGH (ref 70–99)
Potassium: 3.7 mmol/L (ref 3.5–5.1)
Potassium: 3.6 mmol/L (ref 3.5–5.1)
Sodium: 133 mmol/L — ABNORMAL LOW (ref 135–145)
Sodium: 128 mmol/L — ABNORMAL LOW (ref 135–145)

## 2023-08-15 LAB — MAGNESIUM: Magnesium: 1.9 mg/dL (ref 1.7–2.4)

## 2023-08-15 MED ORDER — FENTANYL CITRATE PF 50 MCG/ML IJ SOSY
12.5000 ug | PREFILLED_SYRINGE | Freq: Once | INTRAMUSCULAR | Status: AC | PRN
  Administered 2023-08-15: 12.5 ug via INTRAVENOUS
  Filled 2023-08-15: qty 1

## 2023-08-15 MED ORDER — POTASSIUM CHLORIDE CRYS ER 20 MEQ PO TBCR
60.0000 meq | EXTENDED_RELEASE_TABLET | ORAL | Status: AC
  Administered 2023-08-15 (×2): 60 meq via ORAL
  Filled 2023-08-15 (×2): qty 3

## 2023-08-15 MED ORDER — NALOXONE HCL 0.4 MG/ML IJ SOLN: 0.4000 mg | INTRAMUSCULAR | Status: DC | PRN

## 2023-08-15 MED ORDER — POTASSIUM CHLORIDE CRYS ER 20 MEQ PO TBCR
60.0000 meq | EXTENDED_RELEASE_TABLET | Freq: Once | ORAL | Status: AC
  Administered 2023-08-15: 60 meq via ORAL
  Filled 2023-08-15: qty 3

## 2023-08-15 MED ORDER — METOLAZONE 2.5 MG PO TABS
5.0000 mg | ORAL_TABLET | Freq: Once | ORAL | Status: AC
  Administered 2023-08-15: 5 mg via ORAL
  Filled 2023-08-15: qty 2

## 2023-08-15 MED ORDER — ACETAZOLAMIDE 250 MG PO TABS
250.0000 mg | ORAL_TABLET | Freq: Once | ORAL | Status: AC
Start: 2023-08-15 — End: 2023-08-15
  Administered 2023-08-15: 250 mg via ORAL
  Filled 2023-08-15: qty 1

## 2023-08-15 MED ORDER — OXYCODONE HCL 5 MG PO TABS
5.0000 mg | ORAL_TABLET | ORAL | Status: DC | PRN
  Administered 2023-08-15 – 2023-08-21 (×19): 5 mg via ORAL
  Filled 2023-08-15 (×19): qty 1

## 2023-08-15 MED ORDER — MAGNESIUM SULFATE 2 GM/50ML IV SOLN
2.0000 g | Freq: Once | INTRAVENOUS | Status: AC
  Administered 2023-08-15: 2 g via INTRAVENOUS
  Filled 2023-08-15: qty 50

## 2023-08-15 NOTE — Plan of Care (Signed)
  Problem: Health Behavior/Discharge Planning: Goal: Ability to manage health-related needs will improve Outcome: Progressing   Problem: Metabolic: Goal: Ability to maintain appropriate glucose levels will improve Outcome: Progressing   Problem: Nutritional: Goal: Maintenance of adequate nutrition will improve Outcome: Progressing   Problem: Education: Goal: Knowledge of General Education information will improve Description: Including pain rating scale, medication(s)/side effects and non-pharmacologic comfort measures Outcome: Progressing   Problem: Clinical Measurements: Goal: Will remain free from infection Outcome: Progressing Goal: Respiratory complications will improve Outcome: Progressing Goal: Cardiovascular complication will be avoided Outcome: Progressing   Problem: Nutrition: Goal: Adequate nutrition will be maintained Outcome: Progressing   Problem: Pain Management: Goal: General experience of comfort will improve Outcome: Progressing   Problem: Education: Goal: Knowledge of General Education information will improve Description: Including pain rating scale, medication(s)/side effects and non-pharmacologic comfort measures Outcome: Progressing

## 2023-08-15 NOTE — Progress Notes (Signed)
Patient refusing BiPAP.  RT explained importance and highly encouraged patient to wear.  Patient still refused.  Patient on [place]. VSS.  RT will continue to monitor.

## 2023-08-15 NOTE — Assessment & Plan Note (Signed)
Cell count has been stable, last hgb 11.4

## 2023-08-15 NOTE — Progress Notes (Signed)
   Inpatient Rehab Admissions Coordinator :  Per therapy recommendations patient was screened for CIR candidacy by Sir Mallis RN MSN. Patient is not yet at a level to tolerate the intensity required to pursue a CIR admit . The CIR admissions team will follow and monitor for progress and place a Rehab Consult order if felt to be appropriate. Please contact me with any questions.  Shery Wauneka RN MSN Admissions Coordinator [phone]  

## 2023-08-15 NOTE — Assessment & Plan Note (Signed)
 Continue blood pressure control and statin

## 2023-08-15 NOTE — Assessment & Plan Note (Signed)
Continue with levothyroxine  

## 2023-08-15 NOTE — Assessment & Plan Note (Signed)
Right BKA, patient uses electric wheelchair at home.  Very poor mobility at baseline.   Pressure ulcer right buttock stage 2., left anterior thigh not able to stage, right posterior stump stage 2  Presumed present on admission,  Continue local skin care.

## 2023-08-15 NOTE — Progress Notes (Addendum)
Advanced Heart Failure Rounding Note  Cardiologist: Chilton Si, MD  Chief Complaint: Biventricular HF.  Subjective:   Admit weight 479--->440.   Coox 65%. CVP 18-19 on Milrinone 0.25 mcg/kg/min. UOP 2L on Lasix 30mg /hr + Metolazone 2.5 mg bid. Weights inaccurate d/t bed exchanges. Cr continues to slowly trending down. 2.46>2.24>2.14  Lying in bed. Refused BiPAP overnight, on O2. No SOB, CP, or dizziness.   Objective:    Weight Range: (!) 160.3 kg Body mass index is 57.04 kg/m.   Vital Signs:   Temp:  [98.1 F (36.7 C)-98.9 F (37.2 C)] 98.9 F (37.2 C) (01/21 2320) Pulse Rate:  [93-95] 95 (01/21 2320) Resp:  [13-20] 19 (01/21 2320) BP: (119-138)/(56-71) 119/62 (01/21 2320) SpO2:  [91 %-92 %] 91 % (01/21 2320) Last BM Date : 08/13/23  Weight change: Filed Weights   08/12/23 0500 08/13/23 0500 08/14/23 0500  Weight: (!) 201 kg (!) 200 kg (!) 160.3 kg   Intake/Output:   Intake/Output Summary (Last 24 hours) at 08/15/2023 0717 Last data filed at 08/15/2023 0216 Gross per 24 hour  Intake 1250.92 ml  Output 1900 ml  Net -649.08 ml    Physical Exam    CVP 18-19 General: Weak, sedentary appearing. No distress on [place] HEENT: neck supple.   Cardiac: JVP ~15cm. S1 and S2 present. No murmurs or rub. Resp: Lung sounds clear and equal B/L Abdomen: Obese, soft, non-tender, non-distended. + BS. Extremities: Warm and dry. No rash, cyanosis. 3-4+ peripheral edema. R AKA Neuro: Alert and oriented x3. Affect pleasant. Moves all extremities without difficulty. Lines/Devices: RUE PICC  Telemetry   SR in 80-90s (personally reviewed)  EKG    No new EKG to review  Labs    CBC No results for input(s): "WBC", "NEUTROABS", "HGB", "HCT", "MCV", "PLT" in the last 72 hours.  Basic Metabolic Panel Recent Labs    52/84/13 0430 08/14/23 1730 08/15/23 0538  NA 135 128* 133*  K 3.5 3.6 3.7  CL 98 93* 95*  CO2 29 25 27   GLUCOSE 121* 379* 186*  BUN 70* 65* 66*   CREATININE 2.24* 2.06* 2.14*  CALCIUM 8.2* 8.0* 8.4*  MG 1.9  --  1.9   Liver Function Tests No results for input(s): "AST", "ALT", "ALKPHOS", "BILITOT", "PROT", "ALBUMIN" in the last 72 hours.  No results for input(s): "LIPASE", "AMYLASE" in the last 72 hours. Cardiac Enzymes No results for input(s): "CKTOTAL", "CKMB", "CKMBINDEX", "TROPONINI" in the last 72 hours.  BNP: BNP (last 3 results) Recent Labs    01/24/23 2225 02/09/23 1736 08/01/23 1140  BNP 321.0* 345.1* 523.3*    ProBNP (last 3 results) No results for input(s): "PROBNP" in the last 8760 hours.   D-Dimer No results for input(s): "DDIMER" in the last 72 hours. Hemoglobin A1C No results for input(s): "HGBA1C" in the last 72 hours. Fasting Lipid Panel No results for input(s): "CHOL", "HDL", "LDLCALC", "TRIG", "CHOLHDL", "LDLDIRECT" in the last 72 hours. Thyroid Function Tests No results for input(s): "TSH", "T4TOTAL", "T3FREE", "THYROIDAB" in the last 72 hours.  Invalid input(s): "FREET3"  Other results:  Imaging   No results found.  Medications:    Scheduled Medications:  apixaban  5 mg Oral BID   atorvastatin  40 mg Oral q1800   Chlorhexidine Gluconate Cloth  6 each Topical Daily   famotidine  20 mg Oral BID   Gerhardt's butt cream   Topical TID   insulin aspart  0-15 Units Subcutaneous TID WC   insulin aspart  0-5 Units Subcutaneous QHS   insulin glargine-yfgn  10 Units Subcutaneous Daily   levothyroxine  75 mcg Oral Q0600   lidocaine  1 patch Transdermal Q24H   mexiletine  200 mg Oral Q12H   mupirocin ointment  1 Application Nasal BID   promethazine  25 mg Oral BID AC   sodium chloride flush  10-40 mL Intracatheter Q12H   spironolactone  12.5 mg Oral Daily   venlafaxine XR  37.5 mg Oral Daily    Infusions:  furosemide (LASIX) 200 mg in dextrose 5 % 100 mL (2 mg/mL) infusion 30 mg/hr (08/15/23 0152)   milrinone 0.25 mcg/kg/min (08/15/23 0156)    PRN Medications: acetaminophen **OR**  acetaminophen, albuterol, camphor-menthol, docusate sodium, naLOXone (NARCAN)  injection, mouth rinse, mouth rinse, oxyCODONE, sodium chloride flush, traZODone  Patient Profile   48 y.o. 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    Assessment/Plan   1. Acute on chronic Biventricular Heart Failure.  Echo  EF 35-40% range appears globally hypokinetic to me with D-shaped septum and mild RV dilation/mild RV enlargement, PASP 70 mmHg, mild MR, severe TR, IVC dilated. She has lateral Qs on ECG, cannot rule out ischemic cardiomyopathy with LV dysfunction, but presentation seems predominantly RV failure. Strong suspicion for OHS/OSA, but also has history of PE and cannot rule out chronic thromboembolic pulmonary hypertension (some question about medication compliance at home).  Not on CPAP at home, refusing IP. - Co-ox 65. Continue milrinone 0.25 for RV support and to assist with volume removal. - CVP 18-19. Concerned with CVP rise despite escalation of diuresis.  - Continue Lasix 30mg /hr + diamox 250 + metolazone 5 bid today. - Not a candidate for SGLT2i body habitus and previous infections.  - With significant RV dysfunction, V/Q unlikely given weight - Unna boot for left leg.  2. H/o PE/DVT: See above concern for possible chronic thromboembolic PH.   - Continue Eliquis.  - DVT studies 1/20 negative  - Eventual V/Q scan would be ideal.  3. Suspect OHS/OSA: Will need outpatient sleep study. May need home oxygen.  - Refused CPAP last night.  4. 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  Treated medically.  Echo this admission with mild MR.  5. AKI on CKD stage 3: Baseline creatinine around 1.7.  Creatinine peaked at 2.7>2.24> 6. H/o CVA: Right  inferior cerebellar 8/24, possibly paradoxical embolization via PFO.  7. DM2: SSI.  8. Hypothyroidism: Levoxyl. TSH ok.  9. Tobacco Abuse: Discussed cessation.  10. PVCs: on mexiletine 200 mg bid  Consult PT. Non weight bearing.   SDOH - lives alone. Has Personal Care services daily at home. Uses [facility] for appointments.    Length of Stay: 75  [place] Lee, NP  08/15/2023, 7:17 AM  Advanced Heart Failure Team Pager [phone] (M-F; 7a - 5p)  Please contact [facility] for night-coverage after hours (5p -7a ) and weekends on [email or web address]  Patient seen and examined with the above-signed Advanced Practice Provider and/or Housestaff. I personally reviewed laboratory data, imaging studies and relevant notes. I independently examined the patient and formulated the important aspects of the plan. I have edited the note to reflect any of my changes or salient points. I have personally discussed the plan with the patient and/or family.  Remains  on milrinone and lasix gtt. Co-ox ok CVP 15 (checked personally)  Diuresed about 2L but drinking a lot of fluids and weight actually up  Scr improving  Denies CP or SOB   General:  Obese woman sitting in bed. No resp difficulty HEENT: normal Neck: supple.JVP to ear Carotids 2+ bilat; no bruits. No lymphadenopathy or thryomegaly appreciated. Cor: PMI nondisplaced. Regular rate & rhythm. 2/6 TR Lungs: clear Abdomen: obese soft, nontender, nondistended. Good bowel sounds. Extremities: no cyanosis, clubbing, rash, 2-3+ edema S/P R BKA Neuro: alert & orientedx3, cranial nerves grossly intact. moves all 4 extremities w/o difficulty. Affect pleasant  She has severe RV failure. Continue milrinone and high-dose diuretics. Stressed need to decrease fluid intake.If not improving will need RHC.  Arvilla Meres, MD  6:32 PM

## 2023-08-15 NOTE — Hospital Course (Addendum)
[name] was admitted to the hospital with the working diagnosis of heart failure.   47/F with history of DVT/PE, chronic systolic CHF, obesity class 3, CVA, CKD 3 A, BKA, hypothyroidism, history of Candida endocarditis in 6/22 presented to the ED with progressive edema and dyspnea. She was placed on oral furosemide and then change to torsemide as outpatient with no significant improvement in her symptoms.  On her initial physical examination her blood pressure was 114/69, HR 79, RR 14 and 02 saturation 95%.  Lungs with decreased breath sounds with no wheezing, heart with S1 and S2 present and regular, no gallops, abdomen with no distention, positive left lower extremity edema and right BKA stump.   Na 132, K 5,1 Cl 104 bicarbonate 21, glucose 151 bun 67 cr 2,0  BNP 523  High sensitive troponin 33 and 36  Wbc 6,3 hgb 12,3 plt 227   Chest radiograph with hypoinflation, cardiomegaly with bilateral hilar vascular congestion, bilateral interstitial central infiltrates, bilateral small pleural effusions, more right than left.   EKG 83 bpm, right axis deviation, right bundle branch block, left posterior fascicular block, qtc 510, sinus rhythm with 1st degree AV block, with no significant ST segment or T wave changes.   EF 35-40%, global hypokinesis, mildly reduced RV, severe LA elevated PASP Placed on aggressive diuretic regimen.  01/17 right CVC internal jugular vein.  01/19 creatinine worsened, transferred to [facility], initial Co. ox was 48%, started on milrinone.   01/21 PCCM to [facility] service.  01/22 clinically improving, continue with inotropic support and high dose of IV diuretics Pain full edema has improved.  01/24 aggressive diuresis with sequential nephron blockade with improvement in volume status.  01/25 responding well to diuresis and inotropic support.  01/26 transitioned to po loop diuretic and started to wean milrinone  01/27 off milrinone today, plan for discharge home tomorrow.

## 2023-08-15 NOTE — Assessment & Plan Note (Signed)
Continue anticoagulation with apixaban.  ?

## 2023-08-15 NOTE — Assessment & Plan Note (Addendum)
Patient was placed on basal insulin and sliding scale for glucose cover and monitoring.  At the time of her discharge she will resume her insulin regimen. She is ob GLP1 agonist.

## 2023-08-15 NOTE — Assessment & Plan Note (Addendum)
Hyponatremia.   Volume status has improved, at the time of discharge serum cr at 2,21 with K at 3,9 and serum bicarbonate at 35  Na 138 P 3,9   Continue diuresis with torsemide and spironolactone.  Follow up renal function and electrolytes as outpatient.,

## 2023-08-15 NOTE — Plan of Care (Signed)
  Problem: Education: Goal: Individualized Educational Video(s) Outcome: Progressing   Problem: Health Behavior/Discharge Planning: Goal: Ability to identify and utilize available resources and services will improve Outcome: Progressing Goal: Ability to manage health-related needs will improve Outcome: Progressing   Problem: Metabolic: Goal: Ability to maintain appropriate glucose levels will improve Outcome: Progressing   Problem: Nutritional: Goal: Maintenance of adequate nutrition will improve Outcome: Progressing Goal: Progress toward achieving an optimal weight will improve Outcome: Progressing   Problem: Skin Integrity: Goal: Risk for impaired skin integrity will decrease Outcome: Progressing   Problem: Tissue Perfusion: Goal: Adequacy of tissue perfusion will improve Outcome: Progressing   Problem: Education: Goal: Knowledge of General Education information will improve Description: Including pain rating scale, medication(s)/side effects and non-pharmacologic comfort measures Outcome: Progressing   Problem: Health Behavior/Discharge Planning: Goal: Ability to manage health-related needs will improve Outcome: Progressing   Problem: Clinical Measurements: Goal: Ability to maintain clinical measurements within normal limits will improve Outcome: Progressing Goal: Will remain free from infection Outcome: Progressing Goal: Diagnostic test results will improve Outcome: Progressing Goal: Respiratory complications will improve Outcome: Progressing Goal: Cardiovascular complication will be avoided Outcome: Progressing   Problem: Activity: Goal: Risk for activity intolerance will decrease Outcome: Progressing   Problem: Nutrition: Goal: Adequate nutrition will be maintained Outcome: Progressing   Problem: Coping: Goal: Level of anxiety will decrease Outcome: Progressing   Problem: Elimination: Goal: Will not experience complications related to bowel  motility Outcome: Progressing Goal: Will not experience complications related to urinary retention Outcome: Progressing   Problem: Pain Management: Goal: General experience of comfort will improve Outcome: Progressing   Problem: Safety: Goal: Ability to remain free from injury will improve Outcome: Progressing   Problem: Skin Integrity: Goal: Risk for impaired skin integrity will decrease Outcome: Progressing   Problem: Education: Goal: Knowledge of General Education information will improve Description: Including pain rating scale, medication(s)/side effects and non-pharmacologic comfort measures Outcome: Progressing   Problem: Health Behavior/Discharge Planning: Goal: Ability to manage health-related needs will improve Outcome: Progressing   Problem: Clinical Measurements: Goal: Ability to maintain clinical measurements within normal limits will improve Outcome: Progressing Goal: Will remain free from infection Outcome: Progressing Goal: Diagnostic test results will improve Outcome: Progressing Goal: Respiratory complications will improve Outcome: Progressing Goal: Cardiovascular complication will be avoided Outcome: Progressing   Problem: Activity: Goal: Risk for activity intolerance will decrease Outcome: Progressing   Problem: Nutrition: Goal: Adequate nutrition will be maintained Outcome: Progressing   Problem: Coping: Goal: Level of anxiety will decrease Outcome: Progressing   Problem: Elimination: Goal: Will not experience complications related to bowel motility Outcome: Progressing Goal: Will not experience complications related to urinary retention Outcome: Progressing   Problem: Pain Managment: Goal: General experience of comfort will improve and/or be controlled Outcome: Progressing   Problem: Safety: Goal: Ability to remain free from injury will improve Outcome: Progressing

## 2023-08-15 NOTE — Progress Notes (Addendum)
Progress Note   Patient: Karen Bennett:[id number] DOB: [birth year] DOA: 08/01/2023     14 DOS: the patient was seen and examined on 08/15/2023   Brief hospital course: 47/F with history of DVT/PE, chronic systolic CHF, super morbid obesity, CVA, CKD 3 A, BKA, hypothyroidism, history of Candida endocarditis in 6/22 presented to the ED with progressive edema and dyspnea.  On admission echo noted EF 35-40%, global hypokinesis, mildly reduced RV, severe LA elevated PASP -Started on diuretics, creatinine worsened, transferred to [facility], initial Co. 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 Echocardiogram with reduced LV systolic function EF 35 to 40%, LV cavity with mild dilatation, severe akinesis of the left ventricular, mid inferior wall and inferolateral wall. RV systolic function low normal. Severe elevation of RVSP 69,5 mmHg, LA with moderate dilatation, RA with severe dilatation, moderate tricuspid regurgitation,   Urine output is 1,900 cc  Systolic blood pressure 116 range.  SV02 64,9  Continue volume overloaded.   Plan to continue diuresis with furosemide drip 30 mg per hr  Metolazone, 2,5 mg (01/21) and 5 mg (01/22).  Milrinone 0,25 mcg/Kg/min  Spironolactone   Painful edema lower extremities, she had one dose of fentanyl this morning.  Will change oxycodone to 5 mg every 6 hrs as needed.   PVC, continue with mexiletine, continue telemetry monitoring   Acute kidney injury superimposed on stage 3a chronic kidney disease (HCC) Hyponatremia.   Renal function with serum cr a 2,14 with K at 3,7 and serum bicarbonate at 27  Na 133 and Mg 1,9   Plan to add Kcl 60 meq x2 and 2 g mag sulfate to avoid hypokalemia and hypomagnesemia.  Follow up renal function and electrolytes in am.   History  of pulmonary embolism Continue anticoagulation with apixaban.   Hyperlipidemia associated with type 2 diabetes mellitus (HCC) Continue glucose cover and monitoring with insulin sliding scale.  Basal insulin with 10 units .    Normocytic anemia Cell count has been stable, continue close follow up.   Obesity, Class III, BMI 40-49.9 (morbid obesity) (HCC) Calculated BMI is 63,4   Hx of right BKA (HCC) Right BKA, patient uses electric wheelchair at home.  Very poor mobility at baseline.   Hypothyroidism Continue with levothyroxine   History of CVA (cerebrovascular accident) Continue blood pressure control and statin         Subjective: Patient having pain at her lower extremities, worse with movement. Her dyspnea has improved but continue to have symptoms on exertion. No chest pain.   Physical Exam: Vitals:   08/14/23 2320 08/15/23 0500 08/15/23 0728 08/15/23 1122  BP: 119/62  114/71 116/64  Pulse: 95  83 86  Resp: 19     Temp: 98.9 F (37.2 C)  97.8 F (36.6 C) 97.7 F (36.5 C)  TempSrc: Oral  Oral Oral  SpO2: 91%     Weight:  (!) 178.3 kg    Height:       Neurology awake and alert ENT with mild pallor Cardiovascular with S1 and S2 present and regular with no gallops, rubs or murmurs Respiratory with decreased breath sounds at bases  Abdomen with no distention  Positive lower extremity edema pitting +++ left leg and right thigh  Positive right BKA  Data Reviewed:    Family Communication: no family  at the bedside   Disposition: Status is: Inpatient Remains inpatient appropriate because: IV diuresis, inotropic support.   Planned Discharge Destination: Home/ CIR      Author: Coralie Keens, MD 08/15/2023 12:45 PM  For on call review [email or web address].

## 2023-08-15 NOTE — Progress Notes (Signed)
   08/15/23 2115  BiPAP/CPAP/SIPAP  BiPAP/CPAP/SIPAP Pt Type Adult  Reason BIPAP/CPAP not in use (S)  Non-compliant (patient refuse CPAP for the night. machine is at bedside in the event patient changes her mind.)  BiPAP/CPAP /SiPAP Vitals  Pulse Rate 85  Resp 18  SpO2 94 %

## 2023-08-15 NOTE — TOC Progression Note (Signed)
Transition of Care [facility]) - Progression Note    Patient Details  Name: KUSHI NORTON MRN: [id number] Date of Birth: Dec 18, 1975  Transition of Care [facility]) CM/SW Contact  Harriet Masson, RN Phone Number: 08/15/2023, 4:05 PM  Clinical Narrative:    Emailed Bipap order to [facility] with [employer].   Expected Discharge Plan: Home/Self Care Barriers to Discharge: Continued Medical Work up  Expected Discharge Plan and Services   Discharge Planning Services: CM Consult   Living arrangements for the past 2 months: Apartment                                       Social Determinants of Health (SDOH) Interventions SDOH Screenings   Food Insecurity: No Food Insecurity (08/01/2023)  Housing: Low Risk  (08/01/2023)  Transportation Needs: No Transportation Needs (08/01/2023)  Utilities: Not At Risk (08/01/2023)  Financial Resource Strain: Low Risk  (05/07/2023)   Received from [facility]  Recent Concern: Financial Resource Strain - High Risk (03/21/2023)   Received from [facility]  Physical Activity: Sufficiently Active (05/07/2023)   Received from [facility]  Social Connections: Moderately Integrated (05/07/2023)   Received from [facility]  Stress: Stress Concern Present (05/07/2023)   Received from [facility]  Tobacco Use: High Risk (08/02/2023)    Readmission Risk Interventions    08/02/2023    1:28 PM 02/13/2023   11:23 AM 01/26/2023    3:33 PM  Readmission Risk Prevention Plan  Transportation Screening Complete Complete Complete  PCP or Specialist Appt within 3-5 Days  Complete Complete  HRI or Home Care Consult  Complete Complete  Social Work Consult for Recovery Care Planning/Counseling  Complete Complete  Palliative Care Screening  Complete Not Applicable  Medication Review Oceanographer) Complete Complete Complete  PCP or Specialist appointment within 3-5 days of discharge Complete    HRI or Home Care Consult Complete    SW Recovery Care/Counseling  Consult Complete    Palliative Care Screening Not Applicable    Skilled Nursing Facility Not Applicable

## 2023-08-15 NOTE — Progress Notes (Signed)
Physical Therapy Treatment Patient Details Name: EBONIQUE PAGES MRN: [id number] DOB: 1976-06-24 Today's Date: 08/15/2023   History of Present Illness Pt is a 48 y.o. female admitted 08/01/23 due to edema, SOB, PND and abdominal distention.  Started on diuretics, then transferred [facility] to [facility] 08/11/23 for starting milrinone to aide in diuresis due to elevated pulmonary pressures and Coox 47%.  PMH significant for DM2, HTN, HLD, hypothyroidism, CKD IIIa, obesity, Rt BKA, PE on Xarelto, DVT, endocarditis, cellulitis, lumbar radiculopathy, anxiety, depression, CVA.    PT Comments  Patient progressing with mobility up to EOB this session with mod A (+2 for safety).  She was limited by fear with getting too near EOB and with pain in LE's due to excess fluid.  Patient eager for progressing to allow d/c home if unable to qualify for intensive inpatient rehab.  PT will continue to follow.     If plan is discharge home, recommend the following: Assistance with cooking/housework;Assist for transportation;Two people to help with walking and/or transfers;Two people to help with bathing/dressing/bathroom   Can travel by private vehicle        Equipment Recommendations  None recommended by PT    Recommendations for Other Services       Precautions / Restrictions Precautions Precautions: Fall Precaution Comments: R BKA, pt reports is NWB due to spinter femur fx in 2018 Restrictions RLE Weight Bearing Per Provider Order: Non weight bearing     Mobility  Bed Mobility Overal bed mobility: Needs Assistance Bed Mobility: Supine to Sit, Sit to Supine     Supine to sit: Mod assist, HOB elevated, Used rails Sit to supine: Mod assist, +2 for safety/equipment, Used rails   General bed mobility comments: pulls on rails to long sitting, then assist for R LE and pt able to turn hips and scoot to EOB with increased time and effort; to supine assist for lifting legs and pt able to scoot hips; attempting to  scoot up in bed in supine pulling on rails, pt unable, once up to long sitting again, pt able to scoot up with HOB flat and foot of bed up    Transfers                   General transfer comment: does not feel ready yet    Ambulation/Gait                   Stairs             Wheelchair Mobility     Tilt Bed    Modified Rankin (Stroke Patients Only)       Balance Overall balance assessment: Needs assistance   Sitting balance-Leahy Scale: Fair Sitting balance - Comments: in long sitting able to sit without hand support briefly, with EOB pt fearful  of falling forward and at times leaning back on hands                                    Cognition Arousal: Alert Behavior During Therapy: Lability (tearful with pain during mobility) Overall Cognitive Status: Within Functional Limits for tasks assessed                                          Exercises      General Comments General  comments (skin integrity, edema, etc.): HR up to 110's with mobility, SpO2 when attatched reading 96% on RA.  Noted L knee crease with wound from skin tight and applied nystatin due to moisture, RN aware      Pertinent Vitals/Pain Pain Assessment Pain Assessment: Faces Faces Pain Scale: Hurts whole lot Pain Location: legs with mobility Pain Descriptors / Indicators: Aching, Tightness Pain Intervention(s): Monitored during session, Patient requesting pain meds-RN notified, Repositioned    Home Living                          Prior Function            PT Goals (current goals can now be found in the care plan section) Progress towards PT goals: Progressing toward goals    Frequency    Min 1X/week      PT Plan      Co-evaluation              AM-PAC PT "6 Clicks" Mobility   Outcome Measure  Help needed turning from your back to your side while in a flat bed without using bedrails?: A Lot Help needed  moving from lying on your back to sitting on the side of a flat bed without using bedrails?: A Lot Help needed moving to and from a bed to a chair (including a wheelchair)?: A Lot Help needed standing up from a chair using your arms (e.g., wheelchair or bedside chair)?: Total Help needed to walk in hospital room?: Total Help needed climbing 3-5 steps with a railing? : Total 6 Click Score: 9    End of Session   Activity Tolerance: Patient limited by pain Patient left: in bed;with call bell/phone within reach   PT Visit Diagnosis: Other abnormalities of gait and mobility (R26.89);Muscle weakness (generalized) (M62.81)     Time: 1133-1200 PT Time Calculation (min) (ACUTE ONLY): 27 min  Charges:    $Therapeutic Activity: 23-37 mins PT General Charges $$ ACUTE PT VISIT: 1 Visit                     Sheran Lawless, PT Acute Rehabilitation Services Office:[phone] 08/15/2023    Elray Mcgregor 08/15/2023, 4:56 PM

## 2023-08-15 NOTE — Assessment & Plan Note (Signed)
Calculated BMI is 63,4

## 2023-08-15 NOTE — Assessment & Plan Note (Addendum)
Echocardiogram with reduced LV systolic function EF 35 to 40%, LV cavity with mild dilatation, severe akinesis of the left ventricular, mid inferior wall and inferolateral wall. RV systolic function low normal. 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  Urine output is 3,650 cc  Systolic blood pressure 120 range.  SV02 57.8  Sequential nephron blockade.  Continue with torsemide 80 mg bid.  Metolazone, 2,5 mg (01/21) and 5 mg (01/22). (01/23) 01/24  Acetazolamide 01/22, 02/23 02/24 Spironolactone 12/5 mg.   Remove central line tomorrow  Painful edema lower extremities, continue with oxycodone to 5 mg every 6 hrs as needed.   PVC, continue with mexiletine, continue telemetry monitoring

## 2023-08-15 NOTE — TOC Progression Note (Signed)
Transition of Care (TOC) - Progression Note   Received a paper order form from Jermaine with [employer]   NCM placed on patient's chart. Dr Ella Jubilee will review and sign .  Patient Details  Name: Karen Bennett MRN: [id number] Date of Birth: 02-10-1976  Transition of Care [facility]) CM/SW Contact  Jeralynn Vaquera, Adria Devon, RN Phone Number: 08/15/2023, 10:02 AM  Clinical Narrative:       Expected Discharge Plan: Home/Self Care Barriers to Discharge: Continued Medical Work up  Expected Discharge Plan and Services   Discharge Planning Services: CM Consult   Living arrangements for the past 2 months: Apartment                                       Social Determinants of Health (SDOH) Interventions SDOH Screenings   Food Insecurity: No Food Insecurity (08/01/2023)  Housing: Low Risk  (08/01/2023)  Transportation Needs: No Transportation Needs (08/01/2023)  Utilities: Not At Risk (08/01/2023)  Financial Resource Strain: Low Risk  (05/07/2023)   Received from [facility]  Recent Concern: Financial Resource Strain - High Risk (03/21/2023)   Received from [facility]  Physical Activity: Sufficiently Active (05/07/2023)   Received from [facility]  Social Connections: Moderately Integrated (05/07/2023)   Received from [facility]  Stress: Stress Concern Present (05/07/2023)   Received from [facility]  Tobacco Use: High Risk (08/02/2023)    Readmission Risk Interventions    08/02/2023    1:28 PM 02/13/2023   11:23 AM 01/26/2023    3:33 PM  Readmission Risk Prevention Plan  Transportation Screening Complete Complete Complete  PCP or Specialist Appt within 3-5 Days  Complete Complete  HRI or Home Care Consult  Complete Complete  Social Work Consult for Recovery Care Planning/Counseling  Complete Complete  Palliative Care Screening  Complete Not Applicable  Medication Review Oceanographer) Complete Complete Complete  PCP or Specialist appointment within 3-5 days of discharge  Complete    HRI or Home Care Consult Complete    SW Recovery Care/Counseling Consult Complete    Palliative Care Screening Not Applicable    Skilled Nursing Facility Not Applicable

## 2023-08-16 DIAGNOSIS — E1169 Type 2 diabetes mellitus with other specified complication: Secondary | ICD-10-CM | POA: Diagnosis not present

## 2023-08-16 DIAGNOSIS — Z86711 Personal history of pulmonary embolism: Secondary | ICD-10-CM | POA: Diagnosis not present

## 2023-08-16 DIAGNOSIS — I5023 Acute on chronic systolic (congestive) heart failure: Secondary | ICD-10-CM | POA: Diagnosis not present

## 2023-08-16 DIAGNOSIS — N179 Acute kidney failure, unspecified: Secondary | ICD-10-CM | POA: Diagnosis not present

## 2023-08-16 LAB — BASIC METABOLIC PANEL
Anion gap: 10 (ref 5–15)
Anion gap: 10 (ref 5–15)
BUN: 66 mg/dL — ABNORMAL HIGH (ref 6–20)
BUN: 63 mg/dL — ABNORMAL HIGH (ref 6–20)
CO2: 30 mmol/L (ref 22–32)
CO2: 31 mmol/L (ref 22–32)
Calcium: 8.4 mg/dL — ABNORMAL LOW (ref 8.9–10.3)
Calcium: 8.4 mg/dL — ABNORMAL LOW (ref 8.9–10.3)
Chloride: 97 mmol/L — ABNORMAL LOW (ref 98–111)
Chloride: 93 mmol/L — ABNORMAL LOW (ref 98–111)
Creatinine, Ser: 2.14 mg/dL — ABNORMAL HIGH (ref 0.44–1.00)
Creatinine, Ser: 2.13 mg/dL — ABNORMAL HIGH (ref 0.44–1.00)
GFR, Estimated: 28 mL/min — ABNORMAL LOW (ref >60)
GFR, Estimated: 28 mL/min — ABNORMAL LOW (ref >60)
Glucose, Bld: 148 mg/dL — ABNORMAL HIGH (ref 70–99)
Glucose, Bld: 235 mg/dL — ABNORMAL HIGH (ref 70–99)
Potassium: 4.1 mmol/L (ref 3.5–5.1)
Potassium: 3.9 mmol/L (ref 3.5–5.1)
Sodium: 137 mmol/L (ref 135–145)
Sodium: 134 mmol/L — ABNORMAL LOW (ref 135–145)

## 2023-08-16 LAB — COOXEMETRY PANEL
Carboxyhemoglobin: 1.8 % — ABNORMAL HIGH (ref 0.5–1.5)
Methemoglobin: 0.8 % (ref 0.0–1.5)
O2 Saturation: 69.1 %
Total hemoglobin: 10.4 g/dL — ABNORMAL LOW (ref 12.0–16.0)

## 2023-08-16 LAB — GLUCOSE, CAPILLARY
Glucose-Capillary: 155 mg/dL — ABNORMAL HIGH (ref 70–99)
Glucose-Capillary: 137 mg/dL — ABNORMAL HIGH (ref 70–99)
Glucose-Capillary: 146 mg/dL — ABNORMAL HIGH (ref 70–99)
Glucose-Capillary: 140 mg/dL — ABNORMAL HIGH (ref 70–99)
Glucose-Capillary: 112 mg/dL — ABNORMAL HIGH (ref 70–99)

## 2023-08-16 LAB — MAGNESIUM: Magnesium: 2.3 mg/dL (ref 1.7–2.4)

## 2023-08-16 MED ORDER — POTASSIUM CHLORIDE CRYS ER 20 MEQ PO TBCR
40.0000 meq | EXTENDED_RELEASE_TABLET | Freq: Once | ORAL | Status: AC
  Administered 2023-08-16: 40 meq via ORAL
  Filled 2023-08-16: qty 2

## 2023-08-16 MED ORDER — METOLAZONE 2.5 MG PO TABS
5.0000 mg | ORAL_TABLET | Freq: Two times a day (BID) | ORAL | Status: AC
  Administered 2023-08-16 (×2): 5 mg via ORAL
  Filled 2023-08-16 (×2): qty 2

## 2023-08-16 MED ORDER — MEXILETINE HCL 250 MG PO CAPS
250.0000 mg | ORAL_CAPSULE | Freq: Two times a day (BID) | ORAL | Status: DC
  Administered 2023-08-16 – 2023-08-21 (×10): 250 mg via ORAL
  Filled 2023-08-16 (×11): qty 1

## 2023-08-16 MED ORDER — ACETAZOLAMIDE 250 MG PO TABS
250.0000 mg | ORAL_TABLET | Freq: Once | ORAL | Status: AC
  Administered 2023-08-16: 250 mg via ORAL
  Filled 2023-08-16: qty 1

## 2023-08-16 NOTE — Progress Notes (Signed)
Progress Note   Patient: Karen Bennett ZOX:[id number] DOB: 03/10/1976 DOA: 08/01/2023     15 DOS: the patient was seen and examined on 08/16/2023   Brief hospital course: 47/F with history of DVT/PE, chronic systolic CHF, super morbid obesity, CVA, CKD 3 A, BKA, hypothyroidism, history of Candida endocarditis in 6/22 presented to the ED with progressive edema and dyspnea.  On admission echo noted EF 35-40%, global hypokinesis, mildly reduced RV, severe LA elevated PASP -Started on diuretics, creatinine worsened, transferred to [facility], initial Co. ox was 48%, started on milrinone 1/19 -Heart failure team following -PCCM to [facility] service from PCCM today 1/21  01/22 clinically improving, continue with inotropic support and high dose of IV diuretics Pain full edema has improved.   Assessment and Plan: * Acute on chronic systolic heart failure (HCC) Echocardiogram with reduced LV systolic function EF 35 to 40%, LV cavity with mild dilatation, severe akinesis of the left ventricular, mid inferior wall and inferolateral wall. RV systolic function low normal. 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  Urine output is 4,600 cc  Systolic blood pressure 116 range.  SV02 69.1  Sequential nephron blockade.  Furosemide drip 30 mg per hr  Metolazone, 2,5 mg (01/21) and 5 mg (01/22). (01/23) Acetazolamide 01/22, 02/23  Spironolactone   Milrinone 0,25 mcg/Kg/min   Painful edema lower extremities, continue with oxycodone to 5 mg every 6 hrs as needed.   PVC, continue with mexiletine, continue telemetry monitoring   Acute kidney injury superimposed on stage 3a chronic kidney disease (HCC) Hyponatremia.   Serum cr 2.14 with K at 4.1 and serum bicarbonate at 30. Na 137 and Mg 2.3   Continue Kcl to prevent hypokalemia,  Follow up renal function and electrolytes in  am.   History of pulmonary embolism Continue anticoagulation with apixaban.   Hyperlipidemia associated with type 2 diabetes mellitus (HCC) Continue glucose cover and monitoring with insulin sliding scale.  Basal insulin with 10 units .  Fasting glucose today 148    Normocytic anemia Cell count has been stable, last hgb 11.4   Obesity, Class III, BMI 40-49.9 (morbid obesity) (HCC) Calculated BMI is 63,4   Hx of right BKA (HCC) Right BKA, patient uses electric wheelchair at home.  Very poor mobility at baseline.   Pressure ulcer right buttock stage 2., left anterior thigh not able to stage, right posterior stump stage 2  Presumed present on admission,  Continue local skin care.   Hypothyroidism Continue with levothyroxine   History of CVA (cerebrovascular accident) Continue blood pressure control and statin         Subjective: Patient is feeling better, her edema has improved and pain has improved as well, no chest pain,   Physical Exam: Vitals:   08/15/23 2115 08/15/23 2311 08/16/23 0400 08/16/23 0455  BP:  114/61  115/62  Pulse: 85 84  89  Resp: 18 20 15 18   Temp:  98.3 F (36.8 C)  97.7 F (36.5 C)  TempSrc:  Oral  Oral  SpO2: 94% 90%  92%  Weight:    (!) 188.3 kg  Height:       Neurology awake and alert ENT with mild pallor Cardiovascular with S1 and S2 present and regular, positive S3 gallop, with no murmurs  Respiratory with no rales or wheezing, no rhonchi Abdomen with no distention  Positive lower extremity edema ++ bilaterally, right  BKA  Data Reviewed:    Family Communication: no family at the bedside   Disposition: Status is: Inpatient Remains inpatient appropriate because: IV diuresis, inotropic support   Planned Discharge Destination: Home     Author: Coralie Keens, MD 08/16/2023 7:06 AM  For on call review [email or web address].

## 2023-08-16 NOTE — Progress Notes (Addendum)
Advanced Heart Failure Rounding Note  Cardiologist: Chilton Si, MD  Chief Complaint: Biventricular HF.  Subjective:   Admit weight 479--->415  Coox 69 on Milrinone 0.25 mcg/kg/min. CVP 15-16. UOP 4.6L (-2.3L) on Lasix 30/hr + diamox 250cc + Metolazone 5 bid.  Weight down 8 lbs. Cr stable.   Feeling well this morning. Lying in bed. No SOB, CP or dizziness.   Objective:    Weight Range: (!) 188.3 kg Body mass index is 67.01 kg/m.   Vital Signs:   Temp:  [97.7 F (36.5 C)-98.4 F (36.9 C)] 97.7 F (36.5 C) (01/23 0455) Pulse Rate:  [84-95] 89 (01/23 0455) Resp:  [15-20] 18 (01/23 0455) BP: (114-126)/(61-64) 115/62 (01/23 0455) SpO2:  [90 %-94 %] 92 % (01/23 0455) Weight:  [188.3 kg] 188.3 kg (01/23 0455) Last BM Date : 08/15/23  Weight change: Filed Weights   08/14/23 0500 08/15/23 0500 08/16/23 0455  Weight: (!) 160.3 kg (!) 191.9 kg (!) 188.3 kg   Intake/Output:   Intake/Output Summary (Last 24 hours) at 08/16/2023 0748 Last data filed at 08/16/2023 6578 Gross per 24 hour  Intake 2333.17 ml  Output 4600 ml  Net -2266.83 ml    Physical Exam    CVP 15-16 General: Weak, sedentary appearing. No distress on  HEENT: neck supple.   Cardiac: JVP difficult to assess. S1 and S2 present. No murmurs or rub. Resp: Lung sounds clear and equal B/L Abdomen: Soft, non-tender, non-distended. + BS. Extremities: Warm and dry. No rash, cyanosis. Red lower extremities. 3+ peripheral edema. R BKA Neuro: Alert and oriented x3. Affect pleasant. Moves all extremities without difficulty. Lines/Devices:  R subclavial PICC  Telemetry   SR in 90s, occasional PVCs, brief bigeminy overnight (personally reviewed)  EKG    No new EKG to review  Labs    CBC No results for input(s): "WBC", "NEUTROABS", "HGB", "HCT", "MCV", "PLT" in the last 72 hours.  Basic Metabolic Panel Recent Labs    46/96/29 0538 08/15/23 1732 08/16/23 0450  NA 133* 128* 137  K 3.7 3.6 4.1   CL 95* 94* 97*  CO2 27 27 30   GLUCOSE 186* 388* 148*  BUN 66* 58* 66*  CREATININE 2.14* 1.84* 2.14*  CALCIUM 8.4* 7.6* 8.4*  MG 1.9  --  2.3   Liver Function Tests No results for input(s): "AST", "ALT", "ALKPHOS", "BILITOT", "PROT", "ALBUMIN" in the last 72 hours.  No results for input(s): "LIPASE", "AMYLASE" in the last 72 hours. Cardiac Enzymes No results for input(s): "CKTOTAL", "CKMB", "CKMBINDEX", "TROPONINI" in the last 72 hours.  BNP: BNP (last 3 results) Recent Labs    01/24/23 2225 02/09/23 1736 08/01/23 1140  BNP 321.0* 345.1* 523.3*    ProBNP (last 3 results) No results for input(s): "PROBNP" in the last 8760 hours.   D-Dimer No results for input(s): "DDIMER" in the last 72 hours. Hemoglobin A1C No results for input(s): "HGBA1C" in the last 72 hours. Fasting Lipid Panel No results for input(s): "CHOL", "HDL", "LDLCALC", "TRIG", "CHOLHDL", "LDLDIRECT" in the last 72 hours. Thyroid Function Tests No results for input(s): "TSH", "T4TOTAL", "T3FREE", "THYROIDAB" in the last 72 hours.  Invalid input(s): "FREET3"  Other results:  Imaging   No results found.  Medications:    Scheduled Medications:  acetaZOLAMIDE  250 mg Oral Once   apixaban  5 mg Oral BID   atorvastatin  40 mg Oral q1800   Chlorhexidine Gluconate Cloth  6 each Topical Daily   famotidine  20 mg Oral BID  Gerhardt's butt cream   Topical TID   insulin aspart  0-15 Units Subcutaneous TID WC   insulin aspart  0-5 Units Subcutaneous QHS   insulin glargine-yfgn  10 Units Subcutaneous Daily   levothyroxine  75 mcg Oral Q0600   lidocaine  1 patch Transdermal Q24H   metolazone  5 mg Oral BID   mexiletine  200 mg Oral Q12H   mupirocin ointment  1 Application Nasal BID   potassium chloride  40 mEq Oral Once   promethazine  25 mg Oral BID AC   sodium chloride flush  10-40 mL Intracatheter Q12H   spironolactone  12.5 mg Oral Daily   venlafaxine XR  37.5 mg Oral Daily    Infusions:   furosemide (LASIX) 200 mg in dextrose 5 % 100 mL (2 mg/mL) infusion 30 mg/hr (08/16/23 1610)   milrinone 0.25 mcg/kg/min (08/16/23 0621)    PRN Medications: acetaminophen **OR** acetaminophen, albuterol, camphor-menthol, docusate sodium, naLOXone (NARCAN)  injection, mouth rinse, mouth rinse, oxyCODONE, sodium chloride flush, traZODone  Patient Profile   48 y.o. 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    Assessment/Plan   1. Acute on chronic Biventricular Heart Failure.  Echo  EF 35-40% range appears globally hypokinetic to me with D-shaped septum and mild RV dilation/mild RV enlargement, PASP 70 mmHg, mild MR, severe TR, IVC dilated. She has lateral Qs on ECG, cannot rule out ischemic cardiomyopathy with LV dysfunction, but presentation seems predominantly RV failure. Strong suspicion for OHS/OSA, but also has history of PE and cannot rule out chronic thromboembolic pulmonary hypertension (some question about medication compliance at home).  Not on CPAP at home, refusing IP. Severe RV failure. - Co-ox 69. Continue Milrinone 0.25 for RV support and to assist with volume removal. - CVP 15-16. Continue Lasix 30mg /hr + diamox 250 + metolazone 5 bid today. - Not a candidate for SGLT2i body habitus and previous infections.  - With significant RV dysfunction, V/Q unlikely given weight - Unna boot for left leg.  - Will likely need a repeat cath if volume continues to be an issue.  2. H/o PE/DVT: See above concern for possible chronic thromboembolic PH.   - Continue Eliquis.  - DVT studies 1/20 negative  - Eventual V/Q scan would be ideal.  3. Suspect OHS/OSA: Will need outpatient sleep study. May need home oxygen.  - Refused CPAP last night.  4. 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 Treated medically. Echo this admission with mild MR.  5. AKI on CKD stage 3: Baseline creatinine around 1.7.  Creatinine stable  6. H/o CVA: Right inferior cerebellar 8/24, possibly paradoxical embolization via PFO.  7. DM2: SSI.  8. Hypothyroidism: Levoxyl. TSH ok.  9. Tobacco Abuse: Discussed cessation.  10. PVCs: on mexiletine 200 mg bid  Consult PT. Non weight bearing.   SDOH - lives alone. Has Personal Care services daily at home. Uses [facility] for appointments.    Length of Stay: 59  [place] Lee, NP  08/16/2023, 7:48 AM  Advanced Heart Failure Team Pager [phone] (M-F; 7a - 5p)  Please contact [facility] for night-coverage after hours (5p -7a ) and weekends on [email or web address]  Patient seen and examined with the above-signed Advanced Practice Provider and/or Housestaff. I personally reviewed laboratory data, imaging  studies and relevant notes. I independently examined the patient and formulated the important aspects of the plan. I have edited the note to reflect any of my changes or salient points. I have personally discussed the plan with the patient and/or family.  Diuresis picking up on milrinone and high-dose lasix gtt + metolazone/diamox. Weight down 8 pounds overnight. Scr stable; BP stable.   Denies SOB, orthopnea or PND  General:  Obese woman sitting up in bed No resp difficulty HEENT: normal Neck: supple.JVP to ear Carotids 2+ bilat; no bruits. No lymphadenopathy or thryomegaly appreciated. Cor: PMI nondisplaced. Regular rate & rhythm. No rubs, gallops or murmurs. Lungs: clear Abdomen: obese soft, nontender, nondistended. Good bowel sounds. Extremities: no cyanosis, clubbing, rash, 2+ edema on LLE + UNNA. S/p R BKA Neuro: alert & orientedx3, cranial nerves grossly intact. moves all 4 extremities w/o difficulty. Affect pleasant   She has severe RV failure. Now mobilizing fluid on high-dose lasix and milrinone. Continue current  diuretic regimen. Can consdier RHC as needed to further assess hemodynamics.   Arvilla Meres, MD  2:11 PM

## 2023-08-16 NOTE — Plan of Care (Signed)
  Problem: Education: Goal: Individualized Educational Video(s) Outcome: Progressing   Problem: Health Behavior/Discharge Planning: Goal: Ability to identify and utilize available resources and services will improve Outcome: Progressing Goal: Ability to manage health-related needs will improve Outcome: Progressing   Problem: Metabolic: Goal: Ability to maintain appropriate glucose levels will improve Outcome: Progressing   Problem: Nutritional: Goal: Maintenance of adequate nutrition will improve Outcome: Progressing Goal: Progress toward achieving an optimal weight will improve Outcome: Progressing   Problem: Skin Integrity: Goal: Risk for impaired skin integrity will decrease Outcome: Progressing   Problem: Tissue Perfusion: Goal: Adequacy of tissue perfusion will improve Outcome: Progressing   Problem: Education: Goal: Knowledge of General Education information will improve Description: Including pain rating scale, medication(s)/side effects and non-pharmacologic comfort measures Outcome: Progressing   Problem: Health Behavior/Discharge Planning: Goal: Ability to manage health-related needs will improve Outcome: Progressing   Problem: Clinical Measurements: Goal: Ability to maintain clinical measurements within normal limits will improve Outcome: Progressing Goal: Will remain free from infection Outcome: Progressing Goal: Diagnostic test results will improve Outcome: Progressing Goal: Respiratory complications will improve Outcome: Progressing Goal: Cardiovascular complication will be avoided Outcome: Progressing   Problem: Activity: Goal: Risk for activity intolerance will decrease Outcome: Progressing   Problem: Nutrition: Goal: Adequate nutrition will be maintained Outcome: Progressing   Problem: Coping: Goal: Level of anxiety will decrease Outcome: Progressing   Problem: Elimination: Goal: Will not experience complications related to bowel  motility Outcome: Progressing Goal: Will not experience complications related to urinary retention Outcome: Progressing   Problem: Pain Management: Goal: General experience of comfort will improve Outcome: Progressing   Problem: Safety: Goal: Ability to remain free from injury will improve Outcome: Progressing   Problem: Skin Integrity: Goal: Risk for impaired skin integrity will decrease Outcome: Progressing   Problem: Education: Goal: Knowledge of General Education information will improve Description: Including pain rating scale, medication(s)/side effects and non-pharmacologic comfort measures Outcome: Progressing   Problem: Health Behavior/Discharge Planning: Goal: Ability to manage health-related needs will improve Outcome: Progressing   Problem: Clinical Measurements: Goal: Ability to maintain clinical measurements within normal limits will improve Outcome: Progressing Goal: Will remain free from infection Outcome: Progressing Goal: Diagnostic test results will improve Outcome: Progressing Goal: Respiratory complications will improve Outcome: Progressing Goal: Cardiovascular complication will be avoided Outcome: Progressing   Problem: Activity: Goal: Risk for activity intolerance will decrease Outcome: Progressing   Problem: Nutrition: Goal: Adequate nutrition will be maintained Outcome: Progressing   Problem: Coping: Goal: Level of anxiety will decrease Outcome: Progressing   Problem: Elimination: Goal: Will not experience complications related to bowel motility Outcome: Progressing Goal: Will not experience complications related to urinary retention Outcome: Progressing   Problem: Pain Managment: Goal: General experience of comfort will improve and/or be controlled Outcome: Progressing   Problem: Safety: Goal: Ability to remain free from injury will improve Outcome: Progressing   Problem: Skin Integrity: Goal: Risk for impaired skin integrity  will decrease Outcome: Progressing   Problem: Education: Goal: Individualized Educational Video(s) Outcome: Progressing   Problem: Activity: Goal: Capacity to carry out activities will improve Outcome: Progressing

## 2023-08-16 NOTE — Plan of Care (Signed)
  Problem: Health Behavior/Discharge Planning: Goal: Ability to identify and utilize available resources and services will improve Outcome: Progressing Goal: Ability to manage health-related needs will improve Outcome: Progressing   Problem: Metabolic: Goal: Ability to maintain appropriate glucose levels will improve Outcome: Progressing   Problem: Education: Goal: Knowledge of General Education information will improve Description: Including pain rating scale, medication(s)/side effects and non-pharmacologic comfort measures Outcome: Progressing   Problem: Health Behavior/Discharge Planning: Goal: Ability to manage health-related needs will improve Outcome: Progressing   Problem: Clinical Measurements: Goal: Respiratory complications will improve Outcome: Progressing Goal: Cardiovascular complication will be avoided Outcome: Progressing   Problem: Nutrition: Goal: Adequate nutrition will be maintained Outcome: Progressing   Problem: Pain Management: Goal: General experience of comfort will improve Outcome: Progressing   Problem: Education: Goal: Knowledge of General Education information will improve Description: Including pain rating scale, medication(s)/side effects and non-pharmacologic comfort measures Outcome: Progressing   Problem: Nutrition: Goal: Adequate nutrition will be maintained Outcome: Progressing   Problem: Safety: Goal: Ability to remain free from injury will improve Outcome: Progressing   Problem: Skin Integrity: Goal: Risk for impaired skin integrity will decrease Outcome: Progressing

## 2023-08-17 DIAGNOSIS — E1169 Type 2 diabetes mellitus with other specified complication: Secondary | ICD-10-CM | POA: Diagnosis not present

## 2023-08-17 DIAGNOSIS — N179 Acute kidney failure, unspecified: Secondary | ICD-10-CM | POA: Diagnosis not present

## 2023-08-17 DIAGNOSIS — I5023 Acute on chronic systolic (congestive) heart failure: Secondary | ICD-10-CM | POA: Diagnosis not present

## 2023-08-17 DIAGNOSIS — Z86711 Personal history of pulmonary embolism: Secondary | ICD-10-CM | POA: Diagnosis not present

## 2023-08-17 LAB — GLUCOSE, CAPILLARY
Glucose-Capillary: 125 mg/dL — ABNORMAL HIGH (ref 70–99)
Glucose-Capillary: 144 mg/dL — ABNORMAL HIGH (ref 70–99)
Glucose-Capillary: 126 mg/dL — ABNORMAL HIGH (ref 70–99)
Glucose-Capillary: 135 mg/dL — ABNORMAL HIGH (ref 70–99)

## 2023-08-17 LAB — COOXEMETRY PANEL
Carboxyhemoglobin: 2.2 % — ABNORMAL HIGH (ref 0.5–1.5)
Methemoglobin: <0.7 % (ref 0.0–1.5)
O2 Saturation: 64.5 %
Total hemoglobin: 10.5 g/dL — ABNORMAL LOW (ref 12.0–16.0)

## 2023-08-17 LAB — BASIC METABOLIC PANEL
Anion gap: 8 (ref 5–15)
BUN: 62 mg/dL — ABNORMAL HIGH (ref 6–20)
CO2: 33 mmol/L — ABNORMAL HIGH (ref 22–32)
Calcium: 8.5 mg/dL — ABNORMAL LOW (ref 8.9–10.3)
Chloride: 96 mmol/L — ABNORMAL LOW (ref 98–111)
Creatinine, Ser: 2.1 mg/dL — ABNORMAL HIGH (ref 0.44–1.00)
GFR, Estimated: 29 mL/min — ABNORMAL LOW (ref >60)
Glucose, Bld: 129 mg/dL — ABNORMAL HIGH (ref 70–99)
Potassium: 3.6 mmol/L (ref 3.5–5.1)
Sodium: 137 mmol/L (ref 135–145)

## 2023-08-17 LAB — MAGNESIUM: Magnesium: 2.1 mg/dL (ref 1.7–2.4)

## 2023-08-17 MED ORDER — POTASSIUM CHLORIDE CRYS ER 20 MEQ PO TBCR: 40.0000 meq | EXTENDED_RELEASE_TABLET | Freq: Once | ORAL | Status: DC

## 2023-08-17 MED ORDER — METOLAZONE 2.5 MG PO TABS
5.0000 mg | ORAL_TABLET | Freq: Two times a day (BID) | ORAL | Status: AC
Start: 2023-08-17 — End: 2023-08-17
  Administered 2023-08-17 (×2): 5 mg via ORAL
  Filled 2023-08-17 (×2): qty 2

## 2023-08-17 MED ORDER — ACETAZOLAMIDE 250 MG PO TABS
250.0000 mg | ORAL_TABLET | Freq: Once | ORAL | Status: AC
  Administered 2023-08-17: 250 mg via ORAL
  Filled 2023-08-17: qty 1

## 2023-08-17 MED ORDER — POTASSIUM CHLORIDE CRYS ER 20 MEQ PO TBCR
60.0000 meq | EXTENDED_RELEASE_TABLET | Freq: Once | ORAL | Status: AC
  Administered 2023-08-17: 60 meq via ORAL
  Filled 2023-08-17: qty 3

## 2023-08-17 NOTE — Progress Notes (Addendum)
Advanced Heart Failure Rounding Note  Cardiologist: Chilton Si, MD  Chief Complaint: Biventricular HF Subjective:   Admit weight 479--->411  Lasix 30/hr + diamox 250cc + Metolazone 5 bid. I/O not accurate. Weight and CVP trending down.    Denies SOB. Denies pain.   Objective:    Weight Range: (!) 186.6 kg Body mass index is 66.4 kg/m.   Vital Signs:   Temp:  [97.7 F (36.5 C)-98.7 F (37.1 C)] 98.4 F (36.9 C) (01/24 0414) Pulse Rate:  [85-92] 92 (01/24 0414) Resp:  [16-20] 16 (01/24 0414) BP: (103-129)/(51-73) 129/73 (01/24 0414) SpO2:  [92 %-98 %] 95 % (01/24 0414) Weight:  [186.6 kg] 186.6 kg (01/24 0414) Last BM Date : 08/16/23  Weight change: Filed Weights   08/15/23 0500 08/16/23 0455 08/17/23 0414  Weight: (!) 191.9 kg (!) 188.3 kg (!) 186.6 kg   Intake/Output:   Intake/Output Summary (Last 24 hours) at 08/17/2023 0710 Last data filed at 08/17/2023 0541 Gross per 24 hour  Intake 1773.89 ml  Output 2950 ml  Net -1176.11 ml   CVP 10 Physical Exam   General:  No resp difficulty. R Subclavian  HEENT: normal Neck: supple. JVP difficult to assess.  Carotids 2+ bilat; no bruits. No lymphadenopathy or thryomegaly appreciated. Cor: PMI nondisplaced. Regular rate & rhythm. No rubs, gallops or murmurs. Lungs: clear Abdomen: soft, nontender, nondistended. No hepatosplenomegaly. No bruits or masses. Good bowel sounds. Extremities: no cyanosis, clubbing, rash, edema. R BKA. LLE unna boot.   Neuro: alert & orientedx3, cranial nerves grossly intact. moves all 4 extremities w/o difficulty. Affect pleasant  Telemetry   SR with occasional PVCs. 1 NSVT   EKG    No new EKG to review  Labs    CBC No results for input(s): "WBC", "NEUTROABS", "HGB", "HCT", "MCV", "PLT" in the last 72 hours.  Basic Metabolic Panel Recent Labs    40/98/11 0450 08/16/23 1735 08/17/23 0407  NA 137 134* 137  K 4.1 3.9 3.6  CL 97* 93* 96*  CO2 30 31 33*  GLUCOSE 148*  235* 129*  BUN 66* 63* 62*  CREATININE 2.14* 2.13* 2.10*  CALCIUM 8.4* 8.4* 8.5*  MG 2.3  --  2.1   Liver Function Tests No results for input(s): "AST", "ALT", "ALKPHOS", "BILITOT", "PROT", "ALBUMIN" in the last 72 hours.  No results for input(s): "LIPASE", "AMYLASE" in the last 72 hours. Cardiac Enzymes No results for input(s): "CKTOTAL", "CKMB", "CKMBINDEX", "TROPONINI" in the last 72 hours.  BNP: BNP (last 3 results) Recent Labs    01/24/23 2225 02/09/23 1736 08/01/23 1140  BNP 321.0* 345.1* 523.3*    ProBNP (last 3 results) No results for input(s): "PROBNP" in the last 8760 hours.   D-Dimer No results for input(s): "DDIMER" in the last 72 hours. Hemoglobin A1C No results for input(s): "HGBA1C" in the last 72 hours. Fasting Lipid Panel No results for input(s): "CHOL", "HDL", "LDLCALC", "TRIG", "CHOLHDL", "LDLDIRECT" in the last 72 hours. Thyroid Function Tests No results for input(s): "TSH", "T4TOTAL", "T3FREE", "THYROIDAB" in the last 72 hours.  Invalid input(s): "FREET3"  Other results:  Imaging   No results found.  Medications:    Scheduled Medications:  apixaban  5 mg Oral BID   atorvastatin  40 mg Oral q1800   Chlorhexidine Gluconate Cloth  6 each Topical Daily   famotidine  20 mg Oral BID   Gerhardt's butt cream   Topical TID   insulin aspart  0-15 Units Subcutaneous TID WC  insulin aspart  0-5 Units Subcutaneous QHS   insulin glargine-yfgn  10 Units Subcutaneous Daily   levothyroxine  75 mcg Oral Q0600   lidocaine  1 patch Transdermal Q24H   mexiletine  250 mg Oral Q12H   mupirocin ointment  1 Application Nasal BID   promethazine  25 mg Oral BID AC   sodium chloride flush  10-40 mL Intracatheter Q12H   spironolactone  12.5 mg Oral Daily   venlafaxine XR  37.5 mg Oral Daily    Infusions:  furosemide (LASIX) 200 mg in dextrose 5 % 100 mL (2 mg/mL) infusion 30 mg/hr (08/17/23 0541)   milrinone 0.25 mcg/kg/min (08/17/23 0541)    PRN  Medications: acetaminophen **OR** acetaminophen, albuterol, camphor-menthol, docusate sodium, naLOXone (NARCAN)  injection, mouth rinse, mouth rinse, oxyCODONE, sodium chloride flush, traZODone  Patient Profile   48 y.o. 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    Assessment/Plan   1. Acute on chronic Biventricular Heart Failure.  Echo  EF 35-40% range appears globally hypokinetic to me with D-shaped septum and mild RV dilation/mild RV enlargement, PASP 70 mmHg, mild MR, severe TR, IVC dilated. She has lateral Qs on ECG, cannot rule out ischemic cardiomyopathy with LV dysfunction, but presentation seems predominantly RV failure. Strong suspicion for OHS/OSA, but also has history of PE and cannot rule out chronic thromboembolic pulmonary hypertension (some question about medication compliance at home).  Not on CPAP at home, refusing IP. Severe RV failure. - Continue Milrinone 0.25 for RV support and to assist with volume removal. - CVP down 10-11. Continue Lasix 30mg /hr + diamox 250 + metolazone 5 bid today. Continue today. Anticipate switching to torsemide soon.  - Not a candidate for SGLT2i body habitus and previous infections.  - With significant RV dysfunction, V/Q unlikely given weight - Unna boot for left leg.  2. H/o PE/DVT: See above concern for possible chronic thromboembolic PH.   - Continue Eliquis.  - DVT studies 1/20 negative  - VQ would not bet diagnostic given size and will not change management 3. Suspect OHS/OSA: Will need outpatient sleep study. May need home oxygen.  - Refuses CPAP.   4. 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 Treated medically. Echo this admission with mild MR.  5. AKI on CKD stage 3: Baseline creatinine  around 1.7.  Renal function stable.  6. H/o CVA: Right inferior cerebellar 8/24, possibly paradoxical embolization via PFO.  7. DM2: SSI.  8. Hypothyroidism: Levoxyl. TSH ok.  9. Tobacco Abuse: Discussed cessation.  10. PVCs: Continue mexiletine 250 mg bid   SDOH - lives alone. Has Personal Care services daily at home. Uses [facility] for appointments.    Length of Stay: 16  Amy Clegg, NP  08/17/2023, 7:10 AM  Advanced Heart Failure Team Pager [phone] (M-F; 7a - 5p)  Please contact [facility] for night-coverage after hours (5p -7a ) and weekends on [email or web address]  Patient seen and examined with the above-signed Advanced Practice Provider and/or Housestaff. I personally reviewed laboratory data, imaging studies and relevant notes. I independently examined the patient and formulated the important aspects of the plan. I have edited the note to reflect any of my changes or salient points. I have personally discussed the plan with the patient and/or family.  Remains on  milrinone and high-dose lasix gtt.   Diuresing well. CVP down to 10-11. Denies CP or SOB. Feels like edema is improving.   General:  Obese woman sitting up in bed No resp difficulty HEENT: normal Neck: supple.hard to see JVP  [id number] rate & rhythm. No rubs, gallops or murmurs. Lungs: clear Abdomen: obese soft, nontender, nondistended.Peri Jefferson bowel sounds. Extremities: no cyanosis, clubbing, rash, 1+  edema on LLE Roland Rack) s/p R BKA Neuro: alert & orientedx3, cranial nerves grossly intact. moves all 4 extremities w/o difficulty. Affect pleasant  Suspect we are getting close to as good as we are going to get her. Would continue milrinone. Diurese one more day until CVP < 10 then can switch to po torsemide 80 bid and wean milrinone.   Arvilla Meres, MD  11:37 AM

## 2023-08-17 NOTE — Progress Notes (Signed)
PT Cancellation Note  Patient Details Name: Karen Bennett MRN: [id number] DOB: 06/01/1976   Cancelled Treatment:    Reason Eval/Treat Not Completed: Patient declined, no reason specified (pt states she does not feel well and is very fatigued. Would like PT to come by over the weekend if able.)  Harrel Carina, DPT, CLT  Acute Rehabilitation Services Office: [phone] (Secure chat preferred)   [name] 08/17/2023, 3:53 PM

## 2023-08-17 NOTE — TOC Progression Note (Addendum)
Transition of Care [facility]) - Progression Note    Patient Details  Name: RUFINA KIMERY MRN: [id number] Date of Birth: 03-Jan-1976  [facility]) CM/SW Contact  Elliot Cousin, RN Phone Number: [phone] 08/17/2023, 4:28 PM  Clinical Narrative:    HF TOC CM spoke to pt and offered choice for [facility]. (Medicare.gov list provided and placed on chart). Pt states she would like an hoyer lift that can do weights. Explained hoyer lift just used for lifting. Contacted [employer] rep, Jermaine to follow up. Contacted [facility] rep, Kelly with referral for [facility] and PT. Pt states she has aide that come Mon-Fri for 5-7 hours. She has wheelchair at home. States she will need ambulance transportation to home. Reviewed with patient Living Better with Heart Failure booklet. States she has been trying to monitor diet and food.  Will need HHRN and PT orders with [facility].   [facility] accepted referral for [facility] RN and PT.    Expected Discharge Plan: Home w Home Health Services Barriers to Discharge: Continued Medical Work up  Expected Discharge Plan and Services   Discharge Planning Services: CM Consult Post Acute Care Choice: Home Health Living arrangements for the past 2 months: Apartment                                       Social Determinants of Health (SDOH) Interventions SDOH Screenings   Food Insecurity: No Food Insecurity (08/01/2023)  Housing: Low Risk  (08/01/2023)  Transportation Needs: No Transportation Needs (08/01/2023)  Utilities: Not At Risk (08/01/2023)  Financial Resource Strain: Low Risk  (05/07/2023)   Received from [facility]  Recent Concern: Financial Resource Strain - High Risk (03/21/2023)   Received from [facility]  Physical Activity: Sufficiently Active (05/07/2023)   Received from [facility]  Social Connections: Moderately Integrated (05/07/2023)   Received from [facility]  Stress: Stress Concern Present (05/07/2023)   Received from [facility]   Tobacco Use: High Risk (08/02/2023)    Readmission Risk Interventions    08/02/2023    1:28 PM 02/13/2023   11:23 AM 01/26/2023    3:33 PM  Readmission Risk Prevention Plan  Transportation Screening Complete Complete Complete  PCP or Specialist Appt within 3-5 Days  Complete Complete  HRI or Home Care Consult  Complete Complete  Social Work Consult for Recovery Care Planning/Counseling  Complete Complete  Palliative Care Screening  Complete Not Applicable  Medication Review Oceanographer) Complete Complete Complete  PCP or Specialist appointment within 3-5 days of discharge Complete    HRI or Home Care Consult Complete    SW Recovery Care/Counseling Consult Complete    Palliative Care Screening Not Applicable    Skilled Nursing Facility Not Applicable

## 2023-08-17 NOTE — Plan of Care (Signed)
  Problem: Health Behavior/Discharge Planning: Goal: Ability to identify and utilize available resources and services will improve Outcome: Progressing Goal: Ability to manage health-related needs will improve Outcome: Progressing   Problem: Metabolic: Goal: Ability to maintain appropriate glucose levels will improve Outcome: Progressing   Problem: Nutritional: Goal: Maintenance of adequate nutrition will improve Outcome: Progressing Goal: Progress toward achieving an optimal weight will improve Outcome: Progressing   Problem: Clinical Measurements: Goal: Diagnostic test results will improve Outcome: Progressing   Problem: Activity: Goal: Risk for activity intolerance will decrease Outcome: Progressing   Problem: Nutrition: Goal: Adequate nutrition will be maintained Outcome: Progressing   Problem: Coping: Goal: Level of anxiety will decrease Outcome: Progressing   Problem: Pain Management: Goal: General experience of comfort will improve Outcome: Progressing

## 2023-08-17 NOTE — Progress Notes (Signed)
Progress Note   Patient: Karen Bennett ZOX:[id number] DOB: 02/08/1976 DOA: 08/01/2023     16 DOS: the patient was seen and examined on 08/17/2023   Brief hospital course: 47/F with history of DVT/PE, chronic systolic CHF, super morbid obesity, CVA, CKD 3 A, BKA, hypothyroidism, history of Candida endocarditis in 6/22 presented to the ED with progressive edema and dyspnea.  On admission echo noted EF 35-40%, global hypokinesis, mildly reduced RV, severe LA elevated PASP -Started on diuretics, creatinine worsened, transferred to [facility], initial Co. ox was 48%, started on milrinone 1/19 -Heart failure team following -PCCM to [facility] service from PCCM today 1/21  01/22 clinically improving, continue with inotropic support and high dose of IV diuretics Pain full edema has improved.   Assessment and Plan: * Acute on chronic systolic heart failure (HCC) Echocardiogram with reduced LV systolic function EF 35 to 40%, LV cavity with mild dilatation, severe akinesis of the left ventricular, mid inferior wall and inferolateral wall. RV systolic function low normal. 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  Urine output is 2,950 cc  Systolic blood pressure 116 range.  SV02 64.5  Sequential nephron blockade.  Furosemide drip 30 mg per hr  Metolazone, 2,5 mg (01/21) and 5 mg (01/22). (01/23) 01/24  Acetazolamide 01/22, 02/23 02/24 Spironolactone   Milrinone 0,25 mcg/Kg/min   Painful edema lower extremities, continue with oxycodone to 5 mg every 6 hrs as needed.   PVC, continue with mexiletine, continue telemetry monitoring   Acute kidney injury superimposed on stage 3a chronic kidney disease (HCC) Hyponatremia.   Renal function with serum cr at 2,10 with K at 3,6 and serum bicarbonate at 33.  Na 137 Mg 2,1   Continue Kcl to prevent hypokalemia,  Follow up  renal function and electrolytes in am.   History of pulmonary embolism Continue anticoagulation with apixaban.   Hyperlipidemia associated with type 2 diabetes mellitus (HCC) Continue glucose cover and monitoring with insulin sliding scale.  Basal insulin with 10 units .  Fasting glucose today 148    Normocytic anemia Cell count has been stable, last hgb 11.4   Obesity, Class III, BMI 40-49.9 (morbid obesity) (HCC) Calculated BMI is 63,4   Hx of right BKA (HCC) Right BKA, patient uses electric wheelchair at home.  Very poor mobility at baseline.   Pressure ulcer right buttock stage 2., left anterior thigh not able to stage, right posterior stump stage 2  Presumed present on admission,  Continue local skin care.   Hypothyroidism Continue with levothyroxine   History of CVA (cerebrovascular accident) Continue blood pressure control and statin         Subjective: Patient continue to feel better, dyspnea and edema are improving   Physical Exam: Vitals:   08/17/23 0414 08/17/23 0804 08/17/23 1108 08/17/23 1606  BP: 129/73 (!) 110/55 120/61 131/68  Pulse: 92 83 90 86  Resp: 16 16 16 18   Temp: 98.4 F (36.9 C) 97.8 F (36.6 C) 97.7 F (36.5 C) 97.9 F (36.6 C)  TempSrc: Oral Oral Oral Oral  SpO2: 95% 91% 93% 92%  Weight: (!) 186.6 kg     Height:       Neurology awake and alert ENT with mild pallor Cardiovascular with S1 and S2 present and regular with no gallops or rubs Respiratory with mild rales on anterior auscultation  Abdomen with no distention  Lower extremity left  with edema ++ unna boot in place, she may have some component of lymphedema  Data Reviewed:  =  Family Communication: no family at the bedside   Disposition: Status is: Inpatient Remains inpatient appropriate because: IV diuresis. Inotropic support   Planned Discharge Destination: Home     Author: Coralie Keens, MD 08/17/2023 6:04 PM  For on call review [email or web address].

## 2023-08-18 DIAGNOSIS — Z86711 Personal history of pulmonary embolism: Secondary | ICD-10-CM | POA: Diagnosis not present

## 2023-08-18 DIAGNOSIS — E1169 Type 2 diabetes mellitus with other specified complication: Secondary | ICD-10-CM | POA: Diagnosis not present

## 2023-08-18 DIAGNOSIS — N179 Acute kidney failure, unspecified: Secondary | ICD-10-CM | POA: Diagnosis not present

## 2023-08-18 DIAGNOSIS — I5023 Acute on chronic systolic (congestive) heart failure: Secondary | ICD-10-CM | POA: Diagnosis not present

## 2023-08-18 LAB — BASIC METABOLIC PANEL
Anion gap: 10 (ref 5–15)
BUN: 59 mg/dL — ABNORMAL HIGH (ref 6–20)
CO2: 32 mmol/L (ref 22–32)
Calcium: 8.3 mg/dL — ABNORMAL LOW (ref 8.9–10.3)
Chloride: 95 mmol/L — ABNORMAL LOW (ref 98–111)
Creatinine, Ser: 2.01 mg/dL — ABNORMAL HIGH (ref 0.44–1.00)
GFR, Estimated: 30 mL/min — ABNORMAL LOW (ref >60)
Glucose, Bld: 127 mg/dL — ABNORMAL HIGH (ref 70–99)
Potassium: 3.6 mmol/L (ref 3.5–5.1)
Sodium: 137 mmol/L (ref 135–145)

## 2023-08-18 LAB — GLUCOSE, CAPILLARY
Glucose-Capillary: 125 mg/dL — ABNORMAL HIGH (ref 70–99)
Glucose-Capillary: 124 mg/dL — ABNORMAL HIGH (ref 70–99)
Glucose-Capillary: 137 mg/dL — ABNORMAL HIGH (ref 70–99)
Glucose-Capillary: 122 mg/dL — ABNORMAL HIGH (ref 70–99)

## 2023-08-18 LAB — COOXEMETRY PANEL
Carboxyhemoglobin: 1.8 % — ABNORMAL HIGH (ref 0.5–1.5)
Methemoglobin: <0.7 % (ref 0.0–1.5)
O2 Saturation: 61.5 %
Total hemoglobin: 11.2 g/dL — ABNORMAL LOW (ref 12.0–16.0)

## 2023-08-18 LAB — MAGNESIUM: Magnesium: 2 mg/dL (ref 1.7–2.4)

## 2023-08-18 MED ORDER — TORSEMIDE 20 MG PO TABS
80.0000 mg | ORAL_TABLET | Freq: Two times a day (BID) | ORAL | Status: DC
  Administered 2023-08-18 – 2023-08-21 (×6): 80 mg via ORAL
  Filled 2023-08-18 (×6): qty 4

## 2023-08-18 MED ORDER — POTASSIUM CHLORIDE CRYS ER 20 MEQ PO TBCR
40.0000 meq | EXTENDED_RELEASE_TABLET | Freq: Once | ORAL | Status: AC
  Administered 2023-08-18: 40 meq via ORAL
  Filled 2023-08-18: qty 2

## 2023-08-18 NOTE — Progress Notes (Addendum)
Rounding Note    Patient Name: Karen Bennett Date of Encounter: 08/18/2023  [facility] Cardiologist: Chilton Si, MD   Subjective   No CP or dyspnea  Inpatient Medications    Scheduled Meds:  apixaban  5 mg Oral BID   atorvastatin  40 mg Oral q1800   Chlorhexidine Gluconate Cloth  6 each Topical Daily   famotidine  20 mg Oral BID   Gerhardt's butt cream   Topical TID   insulin aspart  0-15 Units Subcutaneous TID WC   insulin aspart  0-5 Units Subcutaneous QHS   insulin glargine-yfgn  10 Units Subcutaneous Daily   levothyroxine  75 mcg Oral Q0600   lidocaine  1 patch Transdermal Q24H   mexiletine  250 mg Oral Q12H   mupirocin ointment  1 Application Nasal BID   potassium chloride  40 mEq Oral Once   promethazine  25 mg Oral BID AC   sodium chloride flush  10-40 mL Intracatheter Q12H   spironolactone  12.5 mg Oral Daily   venlafaxine XR  37.5 mg Oral Daily   Continuous Infusions:  furosemide (LASIX) 200 mg in dextrose 5 % 100 mL (2 mg/mL) infusion 30 mg/hr (08/18/23 0251)   milrinone 0.25 mcg/kg/min (08/18/23 0250)   PRN Meds: acetaminophen **OR** acetaminophen, albuterol, camphor-menthol, docusate sodium, naLOXone (NARCAN)  injection, mouth rinse, mouth rinse, oxyCODONE, sodium chloride flush, traZODone   Vital Signs    Vitals:   08/17/23 2334 08/18/23 0209 08/18/23 0308 08/18/23 0521  BP: 121/62  125/68   Pulse:      Resp: 18  (!) 21 (!) 24  Temp: 97.7 F (36.5 C)  98.4 F (36.9 C)   TempSrc: Oral  Oral   SpO2: 90%     Weight:  (!) 183.6 kg  (!) 178.9 kg  Height:        Intake/Output Summary (Last 24 hours) at 08/18/2023 0728 Last data filed at 08/17/2023 1904 Gross per 24 hour  Intake 1261.47 ml  Output 1825 ml  Net -563.53 ml      08/18/2023    5:21 AM 08/18/2023    2:09 AM 08/17/2023    4:14 AM  Last 3 Weights  Weight (lbs) 394 lb 6.5 oz 404 lb 12.2 oz 411 lb 6.1 oz  Weight (kg) 178.9 kg 183.6 kg 186.6 kg      Telemetry     Sinus with PVCs and 11 beats nonsustained ventricular tachycardia- Personally Reviewed  Physical Exam   GEN: No acute distress.  Morbid obesity Neck: No JVD Cardiac: RRR, no murmurs, rubs, or gallops.  Respiratory: Clear to auscultation bilaterally. GI: Soft, nontender, non-distended  MS: 1+ edema; status post right BKA Neuro:  Nonfocal  Psych: Normal affect   Labs    High Sensitivity Troponin:   Recent Labs  Lab 08/01/23 1140 08/01/23 1414  TROPONINIHS 33* 36*     Chemistry Recent Labs  Lab 08/16/23 0450 08/16/23 1735 08/17/23 0407 08/18/23 0300  NA 137 134* 137 137  K 4.1 3.9 3.6 3.6  CL 97* 93* 96* 95*  CO2 30 31 33* 32  GLUCOSE 148* 235* 129* 127*  BUN 66* 63* 62* 59*  CREATININE 2.14* 2.13* 2.10* 2.01*  CALCIUM 8.4* 8.4* 8.5* 8.3*  MG 2.3  --  2.1 2.0  GFRNONAA 28* 28* 29* 30*  ANIONGAP 10 10 8 10      Patient Profile     48 y.o. 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  Echocardiogram January 2025 showed ejection fraction 25 to 30%, no apical thrombus, mild left ventricular enlargement, grade 3 diastolic dysfunction, akinesis of the inferior/inferolateral wall, severe pulmonary hypertension, moderate left atrial enlargement, severe right atrial enlargement, moderate tricuspid regurgitation, mild mitral regurgitation.  Assessment & Plan    1 acute on chronic biventricular heart failure-[id number]/[id number] since 08/04/23. Wt 178.9 kg (217.4 kg at time of admission). Coox 61.5. Await AM CVP (most recent 18).  If less than 10 will discontinue Lasix drip and begin Demadex 80 mg twice daily.  Can then wean milrinone.  Will continue present dose of spironolactone, metolazone and Diamox for now.  No SGLT2 inhibitor given body habitus and  history of infections.  Not on ARB or Entresto due to renal insufficiency.  Not on beta-blockade at this point given acute CHF.  Patient is felt to predominantly have RV failure secondary to obesity hypoventilation syndrome/obstructive sleep apnea but also with history of pulmonary embolus.  2 history of PE/DVT-continue apixaban.  3 super morbid obesity-needs weight loss.  4 suspect OHS/OSA-patient apparently has declined CPAP in the past.  5 PVCs-continue mexiletine.  6 history of CVA-right inferior cerebellar August 2024 possibly paradoxical embolization via PFO; continue apixaban.  7 history of mitral valve endocarditis secondary to Candida albicans  8 acute on chronic stage IIIa kidney disease-continue to follow renal function with diuresis.  CVP now 5.  Discontinue Lasix drip and treat with Demadex 80 mg twice daily as outlined above.  For questions or updates, please contact [facility] Please consult [email or web address] for contact info under        Signed, Olga Millers, MD  08/18/2023, 7:28 AM

## 2023-08-18 NOTE — Plan of Care (Signed)
  Problem: Health Behavior/Discharge Planning: Goal: Ability to identify and utilize available resources and services will improve Outcome: Progressing   Problem: Education: Goal: Individualized Educational Video(s) Outcome: Progressing

## 2023-08-18 NOTE — Progress Notes (Signed)
Morning cvp reading 5 by RN

## 2023-08-18 NOTE — Progress Notes (Signed)
Progress Note   Patient: Karen Bennett ION:[id number] DOB: 1976/03/07 DOA: 08/01/2023     17 DOS: the patient was seen and examined on 08/18/2023   Brief hospital course: Mrs. Calligan was admitted to the hospital with the working diagnosis of heart failure.   47/F with history of DVT/PE, chronic systolic CHF, obesity class 3, CVA, CKD 3 A, BKA, hypothyroidism, history of Candida endocarditis in 6/22 presented to the ED with progressive edema and dyspnea. She was placed on oral furosemide and then change to torsemide as outpatient with no significant improvement in her symptoms.  On her initial physical examination her blood pressure was 114/69, HR 79, RR 14 and 02 saturation 95%.  Lungs with decreased breath sounds with no wheezing, heart with S1 and S2 present and regular, no gallops, abdomen with no distention, positive left lower extremity edema and right BKA stump.    EF 35-40%, global hypokinesis, mildly reduced RV, severe LA elevated PASP Placed on aggressive diuretic regimen.  01/17 right CVC  01/19 creatinine worsened, transferred to [facility], initial Co. ox was 48%, started on milrinone.   01/21 PCCM to [facility] service.   01/22 clinically improving, continue with inotropic support and high dose of IV diuretics Pain full edema has improved.   01/24 aggressive diuresis with sequential nephron blockade with improvement in volume status.  01/25 responding well to diuresis and inotropic support.   Assessment and Plan: * Acute on chronic systolic heart failure (HCC) Echocardiogram with reduced LV systolic function EF 35 to 40%, LV cavity with mild dilatation, severe akinesis of the left ventricular, mid inferior wall and inferolateral wall. RV systolic function low normal. 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  Urine output is 1,825 cc  Systolic  blood pressure 120 range.  SV02 61.5  Sequential nephron blockade.  Furosemide drip 30 mg per hr transitioned today to torsemide 80 mg bid.  Metolazone, 2,5 mg (01/21) and 5 mg (01/22). (01/23) 01/24  Acetazolamide 01/22, 02/23 02/24 Spironolactone 12/5 mg.   Milrinone 0,25 mcg/Kg/min, possible wean in the next 24 hrs.  If continue to need central access, may have to place PICC and remove central line.   Painful edema lower extremities, continue with oxycodone to 5 mg every 6 hrs as needed.   PVC, continue with mexiletine, continue telemetry monitoring   Acute kidney injury superimposed on stage 3a chronic kidney disease (HCC) Hyponatremia.   Renal function today with serum cr at 2,0 with K at 3,6 and serum bicarbonate at 32  Na 137  Mg 2,0   Continue Kcl to prevent hypokalemia,  Follow up renal function and electrolytes in am.   History of pulmonary embolism Continue anticoagulation with apixaban.   Hyperlipidemia associated with type 2 diabetes mellitus (HCC) Continue glucose cover and monitoring with insulin sliding scale.  Basal insulin with 10 units .  Fasting glucose today 127 mg/dl.    Normocytic anemia Cell count has been stable, last hgb 11.4   Obesity, Class III, BMI 40-49.9 (morbid obesity) (HCC) Calculated BMI is 63,4   Hx of right BKA (HCC) Right BKA, patient uses electric wheelchair at home.  Very poor mobility at baseline.   Pressure ulcer right buttock stage 2., left anterior thigh not able to stage, right posterior stump stage 2  Presumed present on admission,  Continue local skin care.   Hypothyroidism Continue with levothyroxine   History  of CVA (cerebrovascular accident) Continue blood pressure control and statin         Subjective: Patient continue to feel better, with less dyspnea and less edema, no chest pain. Lower extremity pain is controlled.   Physical Exam: Vitals:   08/18/23 0521 08/18/23 0822 08/18/23 0843 08/18/23 1121   BP:  122/61    Pulse:  81  90  Resp: (!) 24     Temp:  98.5 F (36.9 C)  98 F (36.7 C)  TempSrc:  Oral  Oral  SpO2:  94% 92% 93%  Weight: (!) 178.9 kg     Height:       Neurology awake and alert ENT with no pallor Cardiovascular with S1 and S2 present and regular with no gallops, rubs or murmurs Respiratory with no rales or wheezing, on anterior auscultation  Abdomen with no distention  Positive lower extremity edema left + to ++ unna boot in place, right BKA with improving stump edema.  Data Reviewed:    Family Communication: no family at the bedside   Disposition: Status is: Inpatient Remains inpatient appropriate because: IV diuresis, inotropic support.   Planned Discharge Destination: Home     Author: Coralie Keens, MD 08/18/2023 2:10 PM  For on call review [email or web address].

## 2023-08-19 DIAGNOSIS — E1169 Type 2 diabetes mellitus with other specified complication: Secondary | ICD-10-CM | POA: Diagnosis not present

## 2023-08-19 DIAGNOSIS — Z86711 Personal history of pulmonary embolism: Secondary | ICD-10-CM | POA: Diagnosis not present

## 2023-08-19 DIAGNOSIS — N179 Acute kidney failure, unspecified: Secondary | ICD-10-CM | POA: Diagnosis not present

## 2023-08-19 DIAGNOSIS — I5023 Acute on chronic systolic (congestive) heart failure: Secondary | ICD-10-CM | POA: Diagnosis not present

## 2023-08-19 LAB — COOXEMETRY PANEL
Carboxyhemoglobin: 1.8 % — ABNORMAL HIGH (ref 0.5–1.5)
Methemoglobin: <0.7 % (ref 0.0–1.5)
O2 Saturation: 70.3 %
Total hemoglobin: 6.9 g/dL — CL (ref 12.0–16.0)

## 2023-08-19 LAB — BASIC METABOLIC PANEL
Anion gap: 9 (ref 5–15)
BUN: 57 mg/dL — ABNORMAL HIGH (ref 6–20)
CO2: 34 mmol/L — ABNORMAL HIGH (ref 22–32)
Calcium: 8.3 mg/dL — ABNORMAL LOW (ref 8.9–10.3)
Chloride: 92 mmol/L — ABNORMAL LOW (ref 98–111)
Creatinine, Ser: 1.95 mg/dL — ABNORMAL HIGH (ref 0.44–1.00)
GFR, Estimated: 32 mL/min — ABNORMAL LOW (ref >60)
Glucose, Bld: 205 mg/dL — ABNORMAL HIGH (ref 70–99)
Potassium: 3.5 mmol/L (ref 3.5–5.1)
Sodium: 135 mmol/L (ref 135–145)

## 2023-08-19 LAB — GLUCOSE, CAPILLARY
Glucose-Capillary: 115 mg/dL — ABNORMAL HIGH (ref 70–99)
Glucose-Capillary: 117 mg/dL — ABNORMAL HIGH (ref 70–99)
Glucose-Capillary: 124 mg/dL — ABNORMAL HIGH (ref 70–99)
Glucose-Capillary: 189 mg/dL — ABNORMAL HIGH (ref 70–99)

## 2023-08-19 LAB — MAGNESIUM: Magnesium: 2 mg/dL (ref 1.7–2.4)

## 2023-08-19 MED ORDER — POTASSIUM CHLORIDE CRYS ER 20 MEQ PO TBCR
40.0000 meq | EXTENDED_RELEASE_TABLET | ORAL | Status: AC
  Administered 2023-08-19 (×2): 40 meq via ORAL
  Filled 2023-08-19 (×2): qty 2

## 2023-08-19 MED ORDER — MILRINONE LACTATE IN DEXTROSE 20-5 MG/100ML-% IV SOLN
0.1250 ug/kg/min | INTRAVENOUS | Status: DC
  Administered 2023-08-19 – 2023-08-20 (×2): 0.125 ug/kg/min via INTRAVENOUS
  Filled 2023-08-19 (×2): qty 100

## 2023-08-19 NOTE — Progress Notes (Addendum)
Progress Note   Patient: Karen Bennett ZOX:[id number] DOB: 19-Sep-1975 DOA: 08/01/2023     18 DOS: the patient was seen and examined on 08/19/2023   Brief hospital course: Mrs. Maragh was admitted to the hospital with the working diagnosis of heart failure.   47/F with history of DVT/PE, chronic systolic CHF, obesity class 3, CVA, CKD 3 A, BKA, hypothyroidism, history of Candida endocarditis in 6/22 presented to the ED with progressive edema and dyspnea. She was placed on oral furosemide and then change to torsemide as outpatient with no significant improvement in her symptoms.  On her initial physical examination her blood pressure was 114/69, HR 79, RR 14 and 02 saturation 95%.  Lungs with decreased breath sounds with no wheezing, heart with S1 and S2 present and regular, no gallops, abdomen with no distention, positive left lower extremity edema and right BKA stump.   Na 132, K 5,1 Cl 104 bicarbonate 21, glucose 151 bun 67 cr 2,0  BNP 523  High sensitive troponin 33 and 36  Wbc 6,3 hgb 12,3 plt 227   Chest radiograph with hypoinflation, cardiomegaly with bilateral hilar vascular congestion, bilateral interstitial central infiltrates, bilateral small pleural effusions, more right than left.   EKG 83 bpm, right axis deviation, right bundle branch block, left posterior fascicular block, qtc 510, sinus rhythm with 1st degree AV block, with no significant ST segment or T wave changes.   EF 35-40%, global hypokinesis, mildly reduced RV, severe LA elevated PASP Placed on aggressive diuretic regimen.  01/17 right CVC internal jugular vein.  01/19 creatinine worsened, transferred to [facility], initial Co. ox was 48%, started on milrinone.   01/21 PCCM to [facility] service.  01/22 clinically improving, continue with inotropic support and high dose of IV diuretics Pain full edema has improved.  01/24 aggressive diuresis with sequential nephron blockade with improvement in volume status.  01/25 responding  well to diuresis and inotropic support.  01/26 transitioned to po loop diuretic and started to wean milrinone   Assessment and Plan: * Acute on chronic systolic heart failure (HCC) Echocardiogram with reduced LV systolic function EF 35 to 40%, LV cavity with mild dilatation, severe akinesis of the left ventricular, mid inferior wall and inferolateral wall. RV systolic function low normal. 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  Urine output is 3,450 cc  Systolic blood pressure 120 range.  SV02 70.3   Sequential nephron blockade.  Continue with torsemide 80 mg bid.  Metolazone, 2,5 mg (01/21) and 5 mg (01/22). (01/23) 01/24  Acetazolamide 01/22, 02/23 02/24 Spironolactone 12/5 mg.   Milrinone down to 0,125 mcg/Kg/min. Remove central line when off milrinone.   Painful edema lower extremities, continue with oxycodone to 5 mg every 6 hrs as needed.   PVC, continue with mexiletine, continue telemetry monitoring   Acute kidney injury superimposed on stage 3a chronic kidney disease (HCC) Hyponatremia.   Renal function with serum cr at 1,95 with K at 3,5 and serum bicarbonate at 34  Na 135  Mg 2.0   Continue Kcl to prevent hypokalemia,  Follow up renal function and electrolytes in am.   History of pulmonary embolism Continue anticoagulation with apixaban.   Hyperlipidemia associated with type 2 diabetes mellitus (HCC) Continue glucose cover and monitoring with insulin sliding scale.  Basal insulin with 10 units .  Fasting glucose today 205   Normocytic anemia Cell count has  been stable, last hgb 11.4   Obesity, Class III, BMI 40-49.9 (morbid obesity) (HCC) Calculated BMI is 63,4   Hx of right BKA (HCC) Right BKA, patient uses electric wheelchair at home.  Very poor mobility at baseline.   Pressure ulcer right buttock stage 2., left  anterior thigh not able to stage, right posterior stump stage 2  Presumed present on admission,  Continue local skin care.   Hypothyroidism Continue with levothyroxine   History of CVA (cerebrovascular accident) Continue blood pressure control and statin         Subjective: Patient is feeling better, back to her baseline, no chest pain or dyspnea,   Physical Exam: Vitals:   08/19/23 0352 08/19/23 0427 08/19/23 0808 08/19/23 1128  BP:  123/65 116/62 122/65  Pulse:   87 91  Resp:  14    Temp:  98 F (36.7 C) 97.6 F (36.4 C) 97.8 F (36.6 C)  TempSrc:  Oral Oral Oral  SpO2:   94% 94%  Weight: (!) 177.6 kg     Height:       Neurology awake and alert ENT with mild pallor Cardiovascular with S1 and S2 present and regular with no gallops, rubs or murmurs No JVD Positive lower extremity edema, likely back to baseline, she has some component of lymphedema.  Respiratory with no wheezing, rales or rhonchi on anterior auscultation  Abdomen with no distention  Right BKA  Data Reviewed:    Family Communication: no family at the bedside   Disposition: Status is: Inpatient Remains inpatient appropriate because: wean off milrinone   Planned Discharge Destination: Home    Author: Coralie Keens, MD 08/19/2023 3:25 PM  For on call review [email or web address].

## 2023-08-19 NOTE — Plan of Care (Signed)
  Problem: Health Behavior/Discharge Planning: Goal: Ability to identify and utilize available resources and services will improve Outcome: Progressing Goal: Ability to manage health-related needs will improve Outcome: Progressing   Problem: Education: Goal: Individualized Educational Video(s) Outcome: Progressing

## 2023-08-19 NOTE — Progress Notes (Signed)
Rounding Note    Patient Name: Karen Bennett Date of Encounter: 08/19/2023  [facility] Cardiologist: Chilton Si, MD   Subjective   Denies CP or dyspnea  Inpatient Medications    Scheduled Meds:  apixaban  5 mg Oral BID   atorvastatin  40 mg Oral q1800   Chlorhexidine Gluconate Cloth  6 each Topical Daily   famotidine  20 mg Oral BID   Gerhardt's butt cream   Topical TID   insulin aspart  0-15 Units Subcutaneous TID WC   insulin aspart  0-5 Units Subcutaneous QHS   insulin glargine-yfgn  10 Units Subcutaneous Daily   levothyroxine  75 mcg Oral Q0600   lidocaine  1 patch Transdermal Q24H   mexiletine  250 mg Oral Q12H   mupirocin ointment  1 Application Nasal BID   promethazine  25 mg Oral BID AC   sodium chloride flush  10-40 mL Intracatheter Q12H   spironolactone  12.5 mg Oral Daily   torsemide  80 mg Oral BID   venlafaxine XR  37.5 mg Oral Daily   Continuous Infusions:  milrinone 0.25 mcg/kg/min (08/19/23 0428)   PRN Meds: acetaminophen **OR** acetaminophen, albuterol, camphor-menthol, docusate sodium, naLOXone (NARCAN)  injection, mouth rinse, mouth rinse, oxyCODONE, sodium chloride flush, traZODone   Vital Signs    Vitals:   08/18/23 2006 08/19/23 0000 08/19/23 0352 08/19/23 0427  BP: 116/68 123/66  123/65  Pulse: 84     Resp: 19 20  14   Temp: 98 F (36.7 C) 99.3 F (37.4 C)  98 F (36.7 C)  TempSrc: Oral   Oral  SpO2: 96% 95%    Weight:   (!) 177.6 kg   Height:        Intake/Output Summary (Last 24 hours) at 08/19/2023 0550 Last data filed at 08/18/2023 2008 Gross per 24 hour  Intake 477 ml  Output 2750 ml  Net -2273 ml      08/19/2023    3:52 AM 08/18/2023    5:21 AM 08/18/2023    2:09 AM  Last 3 Weights  Weight (lbs) 391 lb 9.6 oz 394 lb 6.5 oz 404 lb 12.2 oz  Weight (kg) 177.629 kg 178.9 kg 183.6 kg      Telemetry    Sinus with PVCs Personally Reviewed  Physical Exam   GEN: NAD.  Morbid obesity Neck:  Supple Cardiac: RRR Respiratory: CTA GI: Soft, obese MS: 1+ edema; status post right BKA Neuro:  Grossly intact Psych: Normal affect   Labs    High Sensitivity Troponin:   Recent Labs  Lab 08/01/23 1140 08/01/23 1414  TROPONINIHS 33* 36*     Chemistry Recent Labs  Lab 08/17/23 0407 08/18/23 0300 08/19/23 0431  NA 137 137 135  K 3.6 3.6 3.5  CL 96* 95* 92*  CO2 33* 32 34*  GLUCOSE 129* 127* 205*  BUN 62* 59* 57*  CREATININE 2.10* 2.01* 1.95*  CALCIUM 8.5* 8.3* 8.3*  MG 2.1 2.0 2.0  GFRNONAA 29* 30* 32*  ANIONGAP 8 10 9      Patient Profile     48 y.o. 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  Echocardiogram January 2025 showed ejection fraction 35-40%, no apical thrombus, mild left ventricular enlargement, grade 3 diastolic dysfunction, akinesis of  the inferior/inferolateral wall, severe pulmonary hypertension, moderate left atrial enlargement, severe right atrial enlargement, moderate tricuspid regurgitation, mild mitral regurgitation.  Assessment & Plan    1 acute on chronic biventricular heart failure-I/O-2273 Wt 177.6 kg (217.4 kg at time of admission). Coox 70.3. CVP  9-11.  Continue Demadex 80 mg twice daily. Decrease milrinone to 0.125 mcg/kg/min and can likely discontinue tomorrow morning.  Continue present dose of spironolactone.  No SGLT2 inhibitor given body habitus and history of infections.  Not on ARB or Entresto due to renal insufficiency.  Not on beta-blockade at this point given acute CHF.  Patient is felt to predominantly have RV failure secondary to obesity hypoventilation syndrome/obstructive sleep apnea but also with history of pulmonary embolus.  2 history of PE/DVT-continue apixaban.  3 super morbid  obesity-needs weight loss.  4 suspect OHS/OSA-patient apparently has declined CPAP in the past.  5 PVCs-continue mexiletine.  6 history of CVA-right inferior cerebellar August 2024 possibly paradoxical embolization via PFO; continue apixaban.  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  For questions or updates, please contact [facility] Please consult [email or web address] for contact info under        Signed, Olga Millers, MD  08/19/2023, 5:50 AM

## 2023-08-19 NOTE — Progress Notes (Signed)
   08/18/23 2306  BiPAP/CPAP/SIPAP  Reason BIPAP/CPAP not in use Non-compliant (refused, does not wear)

## 2023-08-20 DIAGNOSIS — I5023 Acute on chronic systolic (congestive) heart failure: Secondary | ICD-10-CM | POA: Diagnosis not present

## 2023-08-20 DIAGNOSIS — N179 Acute kidney failure, unspecified: Secondary | ICD-10-CM | POA: Diagnosis not present

## 2023-08-20 DIAGNOSIS — E1169 Type 2 diabetes mellitus with other specified complication: Secondary | ICD-10-CM | POA: Diagnosis not present

## 2023-08-20 DIAGNOSIS — Z86711 Personal history of pulmonary embolism: Secondary | ICD-10-CM | POA: Diagnosis not present

## 2023-08-20 LAB — COOXEMETRY PANEL
Carboxyhemoglobin: 1.6 % — ABNORMAL HIGH (ref 0.5–1.5)
Carboxyhemoglobin: 1.7 % — ABNORMAL HIGH (ref 0.5–1.5)
Methemoglobin: <0.7 % (ref 0.0–1.5)
Methemoglobin: 0.9 % (ref 0.0–1.5)
O2 Saturation: 48.9 %
O2 Saturation: 57.8 %
Total hemoglobin: 11.9 g/dL — ABNORMAL LOW (ref 12.0–16.0)
Total hemoglobin: 11.8 g/dL — ABNORMAL LOW (ref 12.0–16.0)

## 2023-08-20 LAB — GLUCOSE, CAPILLARY
Glucose-Capillary: 143 mg/dL — ABNORMAL HIGH (ref 70–99)
Glucose-Capillary: 163 mg/dL — ABNORMAL HIGH (ref 70–99)
Glucose-Capillary: 137 mg/dL — ABNORMAL HIGH (ref 70–99)
Glucose-Capillary: 110 mg/dL — ABNORMAL HIGH (ref 70–99)

## 2023-08-20 LAB — BASIC METABOLIC PANEL
Anion gap: 11 (ref 5–15)
BUN: 53 mg/dL — ABNORMAL HIGH (ref 6–20)
CO2: 33 mmol/L — ABNORMAL HIGH (ref 22–32)
Calcium: 8.6 mg/dL — ABNORMAL LOW (ref 8.9–10.3)
Chloride: 92 mmol/L — ABNORMAL LOW (ref 98–111)
Creatinine, Ser: 2.11 mg/dL — ABNORMAL HIGH (ref 0.44–1.00)
GFR, Estimated: 29 mL/min — ABNORMAL LOW (ref >60)
Glucose, Bld: 131 mg/dL — ABNORMAL HIGH (ref 70–99)
Potassium: 4 mmol/L (ref 3.5–5.1)
Sodium: 136 mmol/L (ref 135–145)

## 2023-08-20 LAB — MAGNESIUM: Magnesium: 2.1 mg/dL (ref 1.7–2.4)

## 2023-08-20 MED ORDER — EZETIMIBE 10 MG PO TABS
10.0000 mg | ORAL_TABLET | Freq: Every day | ORAL | Status: DC
  Administered 2023-08-20 – 2023-08-21 (×2): 10 mg via ORAL
  Filled 2023-08-20 (×2): qty 1

## 2023-08-20 MED ORDER — ATORVASTATIN CALCIUM 80 MG PO TABS
80.0000 mg | ORAL_TABLET | Freq: Every day | ORAL | Status: DC
  Administered 2023-08-20: 80 mg via ORAL
  Filled 2023-08-20: qty 1

## 2023-08-20 NOTE — Plan of Care (Signed)
Problem: Nutrition: Goal: Adequate nutrition will be maintained Outcome: Progressing   Problem: Coping: Goal: Level of anxiety will decrease Outcome: Progressing   Problem: Elimination: Goal: Will not experience complications related to bowel motility Outcome: Progressing Goal: Will not experience complications related to urinary retention Outcome: Progressing

## 2023-08-20 NOTE — Progress Notes (Addendum)
Advanced Heart Failure Rounding Note  Cardiologist: Chilton Si, MD  Chief Complaint: Biventricular HF Subjective:   Admit weight 479--->370   Denies SOB. Wants to go home.   Objective:    Weight Range: (!) 168.2 kg Body mass index is 59.85 kg/m.   Vital Signs:   Temp:  [97.6 F (36.4 C)-98.7 F (37.1 C)] 98.7 F (37.1 C) (01/27 0803) Pulse Rate:  [87-91] 89 (01/27 0803) Resp:  [18] 18 (01/27 0000) BP: (116-134)/(62-74) 130/69 (01/27 0803) SpO2:  [93 %-98 %] 98 % (01/27 0000) Weight:  [168.2 kg] 168.2 kg (01/27 0506) Last BM Date : 08/19/23  Weight change: Filed Weights   08/18/23 0521 08/19/23 0352 08/20/23 0506  Weight: (!) 178.9 kg (!) 177.6 kg (!) 168.2 kg   Intake/Output:   Intake/Output Summary (Last 24 hours) at 08/20/2023 0807 Last data filed at 08/20/2023 0736 Gross per 24 hour  Intake 358 ml  Output 3650 ml  Net -3292 ml   CVP 5-6  Physical Exam  General:  No resp difficulty HEENT: normal Neck: supple. no JVD. Carotids 2+ bilat; no bruits. No lymphadenopathy or thryomegaly appreciated. Cor: PMI nondisplaced. Regular rate & rhythm. No rubs, gallops or murmurs. Lungs: clear Abdomen: soft, nontender, nondistended. No hepatosplenomegaly. No bruits or masses. Good bowel sounds. Extremities: no cyanosis, clubbing, rash,  R  BKA   Neuro: alert & orientedx3, cranial nerves grossly intact. moves all 4 extremities w/o difficulty. Affect pleasant  Telemetry  SR 80-90s   EKG    No new EKG to review  Labs    CBC No results for input(s): "WBC", "NEUTROABS", "HGB", "HCT", "MCV", "PLT" in the last 72 hours.  Basic Metabolic Panel Recent Labs    16/10/96 0431 08/20/23 0500  NA 135 136  K 3.5 4.0  CL 92* 92*  CO2 34* 33*  GLUCOSE 205* 131*  BUN 57* 53*  CREATININE 1.95* 2.11*  CALCIUM 8.3* 8.6*  MG 2.0 2.1   Liver Function Tests No results for input(s): "AST", "ALT", "ALKPHOS", "BILITOT", "PROT", "ALBUMIN" in the last 72 hours.  No  results for input(s): "LIPASE", "AMYLASE" in the last 72 hours. Cardiac Enzymes No results for input(s): "CKTOTAL", "CKMB", "CKMBINDEX", "TROPONINI" in the last 72 hours.  BNP: BNP (last 3 results) Recent Labs    01/24/23 2225 02/09/23 1736 08/01/23 1140  BNP 321.0* 345.1* 523.3*    ProBNP (last 3 results) No results for input(s): "PROBNP" in the last 8760 hours.   D-Dimer No results for input(s): "DDIMER" in the last 72 hours. Hemoglobin A1C No results for input(s): "HGBA1C" in the last 72 hours. Fasting Lipid Panel No results for input(s): "CHOL", "HDL", "LDLCALC", "TRIG", "CHOLHDL", "LDLDIRECT" in the last 72 hours. Thyroid Function Tests No results for input(s): "TSH", "T4TOTAL", "T3FREE", "THYROIDAB" in the last 72 hours.  Invalid input(s): "FREET3"  Other results:  Imaging   No results found.  Medications:    Scheduled Medications:  apixaban  5 mg Oral BID   atorvastatin  40 mg Oral q1800   Chlorhexidine Gluconate Cloth  6 each Topical Daily   famotidine  20 mg Oral BID   Gerhardt's butt cream   Topical TID   insulin aspart  0-15 Units Subcutaneous TID WC   insulin aspart  0-5 Units Subcutaneous QHS   insulin glargine-yfgn  10 Units Subcutaneous Daily   levothyroxine  75 mcg Oral Q0600   lidocaine  1 patch Transdermal Q24H   mexiletine  250 mg Oral Q12H  promethazine  25 mg Oral BID AC   sodium chloride flush  10-40 mL Intracatheter Q12H   spironolactone  12.5 mg Oral Daily   torsemide  80 mg Oral BID   venlafaxine XR  37.5 mg Oral Daily    Infusions:  milrinone 0.125 mcg/kg/min (08/20/23 0452)    PRN Medications: acetaminophen **OR** acetaminophen, albuterol, camphor-menthol, docusate sodium, naLOXone (NARCAN)  injection, mouth rinse, mouth rinse, oxyCODONE, sodium chloride flush, traZODone  Patient Profile   48 y.o. with history of PE/DVT, chronic HF with mid range EF, super morbid obesity, right inferior cerebellar CVA 8/24, CKD stage 3,  right BKA, hypothyroidism, candida albicans bacteremia with MV endocarditis in 6/22 and prior smoking (quit 2024) was admitted with progressive edema and dyspnea.    Assessment/Plan   1. Acute on chronic Biventricular Heart Failure.  Echo  EF 35-40% range appears globally hypokinetic to me with D-shaped septum and mild RV dilation/mild RV enlargement, PASP 70 mmHg, mild MR, severe TR, IVC dilated. She has lateral Qs on ECG, cannot rule out ischemic cardiomyopathy with LV dysfunction, but presentation seems predominantly RV failure. Strong suspicion for OHS/OSA, but also has history of PE and cannot rule out chronic thromboembolic pulmonary hypertension (some question about medication compliance at home).  Not on CPAP at home, refusing IP. Severe RV failure. - CO-OX stable. Stop milrinone.  - CVP 5-6.  Volume status stable. Continue torsemide 80 mg twice a day  - Continue spiro 12.5 mg daily. Hold off on increase.  - Not a candidate for SGLT2i body habitus and previous infections. Would avoid SGLT2i - With significant RV dysfunction, V/Q unlikely given weight - Unna boot for left leg.  2. H/o PE/DVT: See above concern for possible chronic thromboembolic PH.   - Continue Eliquis.  - DVT studies 1/20 negative  - VQ would not bet diagnostic given size and will not change management 3. Suspect OHS/OSA: Will need outpatient sleep study. May need home oxygen.  - Refuses CPAP.   4. 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 Treated medically. Echo this admission with mild MR.  5. AKI on CKD stage 3: Baseline creatinine around 1.7.  Renal function stable.  6. H/o CVA: Right inferior cerebellar 8/24, possibly paradoxical embolization via PFO.  7. DM2: SSI.  8. Hypothyroidism: Levoxyl. TSH ok.  9. Tobacco Abuse: Discussed cessation.  10. PVCs: Continue mexiletine 250 mg bid. PVC burden suppressed.  11. Hyperlipidemia LDL > 200. Increase atorvastatin 80 mg daily and will add zetia.   Adamant  she wants to go home tomorrow. Stop milrinone as above.   SDOH - lives alone. Has Personal Care services daily at home. Uses [facility] for appointments.  She will need [facility]AR to get home.    Length of Stay: 76  Monee Dembeck, NP  08/20/2023, 8:07 AM  Advanced Heart Failure Team Pager [phone] (M-F; 7a - 5p)  Please contact [facility] for night-coverage after hours (5p -7a ) and weekends on [email or web address]

## 2023-08-20 NOTE — Progress Notes (Signed)
Physical Therapy Treatment Patient Details Name: Karen Bennett MRN: [id number] DOB: 16-Feb-1976 Today's Date: 08/20/2023   History of Present Illness Pt is a 48 y.o. female admitted 08/01/23 due to edema, SOB, PND and abdominal distention.  Started on diuretics, then transferred [facility] to [facility] 08/11/23 for starting milrinone to aide in diuresis due to elevated pulmonary pressures and Coox 47%.  PMH significant for DM2, HTN, HLD, hypothyroidism, CKD IIIa, obesity, Rt BKA, PE on Xarelto, DVT, endocarditis, cellulitis, lumbar radiculopathy, anxiety, depression, CVA.    PT Comments  Pt reports feeling much better. She states the fluid is off of her so now she can move. Supervision supine<> sit, and supervision scooting A/P long sitting entire length of bed (to simulate A/P transfers to/from power w/c). Pt verbalizes no concerns regarding mobility at home. She has all needed DME. Discharge recs updated to HHPT.     If plan is discharge home, recommend the following: Assistance with cooking/housework;Assist for transportation;A little help with walking and/or transfers;A lot of help with bathing/dressing/bathroom   Can travel by private vehicle        Equipment Recommendations  None recommended by PT    Recommendations for Other Services       Precautions / Restrictions Precautions Precautions: Fall;Other (comment) Precaution Comments: h/o R BKA, pt reports is NWB due to spinter femur fx in 2018 Restrictions Weight Bearing Restrictions Per Provider Order: No RLE Weight Bearing Per Provider Order: Non weight bearing     Mobility  Bed Mobility Overal bed mobility: Needs Assistance Bed Mobility: Supine to Sit, Sit to Supine     Supine to sit: Supervision, HOB elevated, Used rails Sit to supine: Used rails, Supervision   General bed mobility comments: no physical assist to transition to/from EOB    Transfers Overall transfer level: Needs assistance Equipment used: None                General transfer comment: Pt reports performing A/P transfers to/from power w/c at baseline. Declining OOB but able to demonstrate ability to scoot A/P entire length of bed supervision. Increased time and effort but no physical assist needed.    Ambulation/Gait               General Gait Details: power w/c at baseline   Stairs             Wheelchair Mobility     Tilt Bed    Modified Rankin (Stroke Patients Only)       Balance Overall balance assessment: Needs assistance Sitting-balance support: No upper extremity supported, Feet unsupported Sitting balance-Leahy Scale: Good                                      Cognition Arousal: Alert Behavior During Therapy: WFL for tasks assessed/performed Overall Cognitive Status: Within Functional Limits for tasks assessed                                          Exercises      General Comments General comments (skin integrity, edema, etc.): HR into 110s with mobility. SpO2 stable on RA.      Pertinent Vitals/Pain Pain Assessment Pain Assessment: No/denies pain    Home Living  Prior Function            PT Goals (current goals can now be found in the care plan section) Acute Rehab PT Goals Patient Stated Goal: home tomorrow Progress towards PT goals: Progressing toward goals    Frequency    Min 1X/week      PT Plan      Co-evaluation              AM-PAC PT "6 Clicks" Mobility   Outcome Measure  Help needed turning from your back to your side while in a flat bed without using bedrails?: None Help needed moving from lying on your back to sitting on the side of a flat bed without using bedrails?: A Little Help needed moving to and from a bed to a chair (including a wheelchair)?: A Little Help needed standing up from a chair using your arms (e.g., wheelchair or bedside chair)?: Total Help needed to walk in hospital room?:  Total Help needed climbing 3-5 steps with a railing? : Total 6 Click Score: 13    End of Session   Activity Tolerance: Patient tolerated treatment well Patient left: in bed;with call bell/phone within reach Nurse Communication: Mobility status PT Visit Diagnosis: Other abnormalities of gait and mobility (R26.89);Muscle weakness (generalized) (M62.81)     Time: 5277-8242 PT Time Calculation (min) (ACUTE ONLY): 13 min  Charges:    $Therapeutic Activity: 8-22 mins PT General Charges $$ ACUTE PT VISIT: 1 Visit                     Ferd Glassing., PT  Office # [phone]    Ilda Foil 08/20/2023, 11:40 AM

## 2023-08-20 NOTE — Progress Notes (Addendum)
Progress Note   Patient: Karen Bennett:[id number] DOB: 1976/07/15 DOA: 08/01/2023     19 DOS: the patient was seen and examined on 08/20/2023   Brief hospital course: Karen Bennett was admitted to the hospital with the working diagnosis of heart failure.   47/F with history of DVT/PE, chronic systolic CHF, obesity class 3, CVA, CKD 3 A, BKA, hypothyroidism, history of Candida endocarditis in 6/22 presented to the ED with progressive edema and dyspnea. She was placed on oral furosemide and then change to torsemide as outpatient with no significant improvement in her symptoms.  On her initial physical examination her blood pressure was 114/69, HR 79, RR 14 and 02 saturation 95%.  Lungs with decreased breath sounds with no wheezing, heart with S1 and S2 present and regular, no gallops, abdomen with no distention, positive left lower extremity edema and right BKA stump.   Na 132, K 5,1 Cl 104 bicarbonate 21, glucose 151 bun 67 cr 2,0  BNP 523  High sensitive troponin 33 and 36  Wbc 6,3 hgb 12,3 plt 227   Chest radiograph with hypoinflation, cardiomegaly with bilateral hilar vascular congestion, bilateral interstitial central infiltrates, bilateral small pleural effusions, more right than left.   EKG 83 bpm, right axis deviation, right bundle branch block, left posterior fascicular block, qtc 510, sinus rhythm with 1st degree AV block, with no significant ST segment or T wave changes.   EF 35-40%, global hypokinesis, mildly reduced RV, severe LA elevated PASP Placed on aggressive diuretic regimen.  01/17 right CVC internal jugular vein.  01/19 creatinine worsened, transferred to [facility], initial Co. ox was 48%, started on milrinone.   01/21 PCCM to [facility] service.  01/22 clinically improving, continue with inotropic support and high dose of IV diuretics Pain full edema has improved.  01/24 aggressive diuresis with sequential nephron blockade with improvement in volume status.  01/25 responding  well to diuresis and inotropic support.  01/26 transitioned to po loop diuretic and started to wean milrinone  01/27 off milrinone today, plan for discharge home tomorrow.   Assessment and Plan: * Acute on chronic systolic heart failure (HCC) Echocardiogram with reduced LV systolic function EF 35 to 40%, LV cavity with mild dilatation, severe akinesis of the left ventricular, mid inferior wall and inferolateral wall. RV systolic function low normal. 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  Urine output is 3,650 cc  Systolic blood pressure 120 range.  SV02 57.8  Sequential nephron blockade.  Continue with torsemide 80 mg bid.  Metolazone, 2,5 mg (01/21) and 5 mg (01/22). (01/23) 01/24  Acetazolamide 01/22, 02/23 02/24 Spironolactone 12/5 mg.   Remove central line tomorrow  Painful edema lower extremities, continue with oxycodone to 5 mg every 6 hrs as needed.   PVC, continue with mexiletine, continue telemetry monitoring   Acute kidney injury superimposed on stage 3a chronic kidney disease (HCC) Hyponatremia.   Today serum cr is 2,1 with K at 4.0 and serum bicarbonate at 33  Na 138 Mg 2.1   Continue diuresis with torsemide,  Follow up renal function in am   History of pulmonary embolism Continue anticoagulation with apixaban.   Hyperlipidemia associated with type 2 diabetes mellitus (HCC) Continue glucose cover and monitoring with insulin sliding scale.  Basal insulin with 10 units .  Fasting glucose today 131   Normocytic anemia Cell count has been stable, last hgb 11.4  Obesity, Class III, BMI 40-49.9 (morbid obesity) (HCC) Calculated BMI is 63,4   Hx of right BKA (HCC) Right BKA, patient uses electric wheelchair at home.  Very poor mobility at baseline.   Pressure ulcer right buttock stage 2., left anterior thigh not able  to stage, right posterior stump stage 2  Presumed present on admission,  Continue local skin care.   Hypothyroidism Continue with levothyroxine   History of CVA (cerebrovascular accident) Continue blood pressure control and statin         Subjective: Patient is feeling better, today with improved mobility with PT, she has no chest pain or dyspnea, edema has improved   Physical Exam: Vitals:   08/20/23 0000 08/20/23 0506 08/20/23 0803 08/20/23 1111  BP: 122/64  130/69 133/74  Pulse:   89 90  Resp: 18     Temp: 98.1 F (36.7 C)  98.7 F (37.1 C) 97.7 F (36.5 C)  TempSrc: Oral  Oral Axillary  SpO2: 98%     Weight:  (!) 168.2 kg    Height:       Neurology awake and alert ENT with mild pallor Cardiovascular with S1 and S2 present and regular with no gallops, rubs or murmurs No JVD Positive lower extremity edema, mostly lymphedema no significant pitting Respiratory with no rales or wheezing, on anterior auscultation Abdomen with no distention  Right BKA  Data Reviewed:   Family Communication: no family at the bedside   Disposition: Status is: Inpatient Remains inpatient appropriate because: recovering heart failure exacerbation, plan for discharge home tomorrow   Planned Discharge Destination: Home    Author: Coralie Keens, MD 08/20/2023 11:34 AM  For on call review [email or web address].

## 2023-08-21 ENCOUNTER — Other Ambulatory Visit (HOSPITAL_COMMUNITY): Payer: Self-pay

## 2023-08-21 DIAGNOSIS — E1169 Type 2 diabetes mellitus with other specified complication: Secondary | ICD-10-CM | POA: Diagnosis not present

## 2023-08-21 DIAGNOSIS — Z86711 Personal history of pulmonary embolism: Secondary | ICD-10-CM | POA: Diagnosis not present

## 2023-08-21 DIAGNOSIS — I5023 Acute on chronic systolic (congestive) heart failure: Secondary | ICD-10-CM | POA: Diagnosis not present

## 2023-08-21 DIAGNOSIS — N179 Acute kidney failure, unspecified: Secondary | ICD-10-CM | POA: Diagnosis not present

## 2023-08-21 LAB — CBC
HCT: 37 % (ref 36.0–46.0)
Hemoglobin: 11.8 g/dL — ABNORMAL LOW (ref 12.0–15.0)
MCH: 27.8 pg (ref 26.0–34.0)
MCHC: 31.9 g/dL (ref 30.0–36.0)
MCV: 87.3 fL (ref 80.0–100.0)
Platelets: 241 10*3/uL (ref 150–400)
RBC: 4.24 MIL/uL (ref 3.87–5.11)
RDW: 13.6 % (ref 11.5–15.5)
WBC: 5.1 10*3/uL (ref 4.0–10.5)
nRBC: 0 % (ref 0.0–0.2)

## 2023-08-21 LAB — RENAL FUNCTION PANEL
Albumin: 2.5 g/dL — ABNORMAL LOW (ref 3.5–5.0)
Anion gap: 9 (ref 5–15)
BUN: 50 mg/dL — ABNORMAL HIGH (ref 6–20)
CO2: 35 mmol/L — ABNORMAL HIGH (ref 22–32)
Calcium: 9 mg/dL (ref 8.9–10.3)
Chloride: 94 mmol/L — ABNORMAL LOW (ref 98–111)
Creatinine, Ser: 2.21 mg/dL — ABNORMAL HIGH (ref 0.44–1.00)
GFR, Estimated: 27 mL/min — ABNORMAL LOW (ref >60)
Glucose, Bld: 118 mg/dL — ABNORMAL HIGH (ref 70–99)
Phosphorus: 3.9 mg/dL (ref 2.5–4.6)
Potassium: 3.9 mmol/L (ref 3.5–5.1)
Sodium: 138 mmol/L (ref 135–145)

## 2023-08-21 LAB — COOXEMETRY PANEL
Carboxyhemoglobin: 1.4 % (ref 0.5–1.5)
Carboxyhemoglobin: 1.8 % — ABNORMAL HIGH (ref 0.5–1.5)
Methemoglobin: <0.7 % (ref 0.0–1.5)
Methemoglobin: <0.7 % (ref 0.0–1.5)
O2 Saturation: 49.1 %
O2 Saturation: 56.8 %
Total hemoglobin: 12.2 g/dL (ref 12.0–16.0)
Total hemoglobin: 12.1 g/dL (ref 12.0–16.0)

## 2023-08-21 LAB — GLUCOSE, CAPILLARY: Glucose-Capillary: 110 mg/dL — ABNORMAL HIGH (ref 70–99)

## 2023-08-21 MED ORDER — MEXILETINE HCL 250 MG PO CAPS
250.0000 mg | ORAL_CAPSULE | Freq: Two times a day (BID) | ORAL | 0 refills | Status: AC
  Filled 2023-08-21: qty 60, 30d supply, fill #0

## 2023-08-21 MED ORDER — EZETIMIBE 10 MG PO TABS
10.0000 mg | ORAL_TABLET | Freq: Every day | ORAL | 0 refills | Status: DC
  Filled 2023-08-21: qty 30, 30d supply, fill #0

## 2023-08-21 MED ORDER — TORSEMIDE 20 MG PO TABS
80.0000 mg | ORAL_TABLET | Freq: Two times a day (BID) | ORAL | 0 refills | Status: DC
  Filled 2023-08-21: qty 240, 30d supply, fill #0

## 2023-08-21 MED ORDER — ATORVASTATIN CALCIUM 80 MG PO TABS
80.0000 mg | ORAL_TABLET | Freq: Every day | ORAL | 0 refills | Status: DC
  Filled 2023-08-21: qty 30, 30d supply, fill #0

## 2023-08-21 MED ORDER — SPIRONOLACTONE 25 MG PO TABS
12.5000 mg | ORAL_TABLET | Freq: Every day | ORAL | 0 refills | Status: DC
  Filled 2023-08-21: qty 15, 30d supply, fill #0

## 2023-08-21 NOTE — Progress Notes (Signed)
Advanced Heart Failure Rounding Note  Cardiologist: Chilton Si, MD  Chief Complaint: Biventricular HF Subjective:   Admit weight 479--->370   Denies SOB. Wants to go home today.   Objective:    Weight Range: (!) 167.8 kg Body mass index is 59.72 kg/m.   Vital Signs:   Temp:  [97.7 F (36.5 C)-98.7 F (37.1 C)] 98.2 F (36.8 C) (01/28 0432) Pulse Rate:  [79-90] 81 (01/28 0432) Resp:  [18-19] 18 (01/28 0432) BP: (130-151)/(69-78) 134/76 (01/28 0432) Weight:  [167.8 kg] 167.8 kg (01/28 0432) Last BM Date : 08/19/23  Weight change: Filed Weights   08/19/23 0352 08/20/23 0506 08/21/23 0432  Weight: (!) 177.6 kg (!) 168.2 kg (!) 167.8 kg   Intake/Output:   Intake/Output Summary (Last 24 hours) at 08/21/2023 0801 Last data filed at 08/21/2023 0433 Gross per 24 hour  Intake 1361.13 ml  Output 3750 ml  Net -2388.87 ml   CVP 5-6 Physical Exam  General:  No resp difficulty HEENT: normal Neck: supple. no JVD. Carotids 2+ bilat; no bruits. No lymphadenopathy or thryomegaly appreciated. Cor: PMI nondisplaced. Regular rate & rhythm. No rubs, gallops or murmurs. Lungs: clear Abdomen: soft, nontender, nondistended. No hepatosplenomegaly. No bruits or masses. Good bowel sounds. Extremities: no cyanosis, clubbing, rash, edema. R BKA Neuro: alert & orientedx3, cranial nerves grossly intact. moves all 4 extremities w/o difficulty. Affect pleasant  Telemetry  SR 80-90s with occasional PVCs.   EKG    No new EKG to review  Labs    CBC Recent Labs    08/21/23 0535  WBC 5.1  HGB 11.8*  HCT 37.0  MCV 87.3  PLT 241    Basic Metabolic Panel Recent Labs    16/10/96 0431 08/20/23 0500 08/21/23 0536  NA 135 136 138  K 3.5 4.0 3.9  CL 92* 92* 94*  CO2 34* 33* 35*  GLUCOSE 205* 131* 118*  BUN 57* 53* 50*  CREATININE 1.95* 2.11* 2.21*  CALCIUM 8.3* 8.6* 9.0  MG 2.0 2.1  --   PHOS  --   --  3.9   Liver Function Tests Recent Labs    08/21/23 0536   ALBUMIN 2.5*    No results for input(s): "LIPASE", "AMYLASE" in the last 72 hours. Cardiac Enzymes No results for input(s): "CKTOTAL", "CKMB", "CKMBINDEX", "TROPONINI" in the last 72 hours.  BNP: BNP (last 3 results) Recent Labs    01/24/23 2225 02/09/23 1736 08/01/23 1140  BNP 321.0* 345.1* 523.3*    ProBNP (last 3 results) No results for input(s): "PROBNP" in the last 8760 hours.   D-Dimer No results for input(s): "DDIMER" in the last 72 hours. Hemoglobin A1C No results for input(s): "HGBA1C" in the last 72 hours. Fasting Lipid Panel No results for input(s): "CHOL", "HDL", "LDLCALC", "TRIG", "CHOLHDL", "LDLDIRECT" in the last 72 hours. Thyroid Function Tests No results for input(s): "TSH", "T4TOTAL", "T3FREE", "THYROIDAB" in the last 72 hours.  Invalid input(s): "FREET3"  Other results:  Imaging   No results found.  Medications:    Scheduled Medications:  apixaban  5 mg Oral BID   atorvastatin  80 mg Oral q1800   Chlorhexidine Gluconate Cloth  6 each Topical Daily   ezetimibe  10 mg Oral Daily   famotidine  20 mg Oral BID   Gerhardt's butt cream   Topical TID   insulin aspart  0-15 Units Subcutaneous TID WC   insulin aspart  0-5 Units Subcutaneous QHS   insulin glargine-yfgn  10 Units Subcutaneous  Daily   levothyroxine  75 mcg Oral Q0600   lidocaine  1 patch Transdermal Q24H   mexiletine  250 mg Oral Q12H   promethazine  25 mg Oral BID AC   sodium chloride flush  10-40 mL Intracatheter Q12H   spironolactone  12.5 mg Oral Daily   torsemide  80 mg Oral BID   venlafaxine XR  37.5 mg Oral Daily    Infusions:    PRN Medications: acetaminophen **OR** acetaminophen, albuterol, camphor-menthol, docusate sodium, naLOXone (NARCAN)  injection, mouth rinse, mouth rinse, oxyCODONE, sodium chloride flush, traZODone  Patient Profile   48 y.o. 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    Assessment/Plan   1. Acute on chronic Biventricular Heart Failure.  Echo  EF 35-40% range appears globally hypokinetic to me with D-shaped septum and mild RV dilation/mild RV enlargement, PASP 70 mmHg, mild MR, severe TR, IVC dilated. She has lateral Qs on ECG, cannot rule out ischemic cardiomyopathy with LV dysfunction, but presentation seems predominantly RV failure. Strong suspicion for OHS/OSA, but also has history of PE and cannot rule out chronic thromboembolic pulmonary hypertension (some question about medication compliance at home).  Not on CPAP at home, refusing IP. Severe RV failure. - CO-OX stable off milrinone.  - Volume status stable.  Continue torsemide 80 mg twice a day - Continue spiro 12.5 mg daily. Hold off on increase.  - Not a candidate for SGLT2i body habitus and previous infections. Would avoid SGLT2i - With significant RV dysfunction, V/Q unlikely given weight - Plan to repeat ECHO 3-4 months. No plan to cath for now. Consider if EF does not improve.  2. H/o PE/DVT: See above concern for possible chronic thromboembolic PH.   - Continue Eliquis.  - DVT studies 1/20 negative  - VQ would not bet diagnostic given size and will not change management 3. Suspect OHS/OSA: Will need outpatient sleep study. May need home oxygen.  - Refuses CPAP.   4. 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 Treated medically. Echo this admission with mild MR.  5. AKI on CKD stage 3: Baseline creatinine around 1.7.  Stable today. Check BMET her follow up. .  6. H/o CVA: Right inferior cerebellar 8/24, possibly paradoxical embolization via PFO.  7. DM2: SSI.  8. Hypothyroidism: Levoxyl. TSH ok.  9. Tobacco Abuse: Discussed cessation.  10. PVCs: Continue mexiletine 250 mg bid. PVC burden suppressed.  11.  Hyperlipidemia LDL > 200. Continue high intensity atorvastatin 80 mg daily and zetia.     SDOH - lives alone. Has Personal Care services daily at home. Uses [facility] for appointments.  She will need PTAR to get home.   Ok to discharge. HF Follow Up on the chart.   HF meds for discharge  Torsemide 80 mg twice a day  Spironolactone 12.5 mg daily  Eliquis 5 mg twice a day  Atorvastatin 80 mg daily  Mexiletine 250 mg twice a day  Zetia 10 mg daily    Length of Stay: 20  Mollyann Halbert, NP  08/21/2023, 8:01 AM  Advanced Heart Failure Team Pager [phone] (M-F; 7a - 5p)  Please contact [facility] for night-coverage after hours (5p -7a ) and weekends on [email or web address]

## 2023-08-21 NOTE — TOC Transition Note (Signed)
Transition of Care [facility]) - Discharge Note   Patient Details  Name: DELONA CLASBY MRN: [id number] Date of Birth: 1975/09/12  Transition of Care [facility]) CM/SW Contact:  Elliot Cousin, RN Phone Number: [phone] 08/21/2023, 11:30 AM   Clinical Narrative:     HF TOC CM contacted [facility] to make aware of dc home today with HH. Contacted [employer] rep, Jermaine to make sure RT will be out to assist pt with BIPAP. Pt has Bipap in the room. Contacted PTAR for transportation home.   Final next level of care: Home w Home Health Services Barriers to Discharge: No Barriers Identified   Patient Goals and CMS Choice Patient states their goals for this hospitalization and ongoing recovery are:: wants to be able to manage her care at home CMS Medicare.gov Compare Post Acute Care list provided to:: Patient Choice offered to / list presented to : Patient [place] ownership interest in [facility].provided to:: Patient    Discharge Placement                       Discharge Plan and Services Additional resources added to the After Visit Summary for     Discharge Planning Services: CM Consult Post Acute Care Choice: Home Health          DME Arranged: Bipap   Date DME Agency Contacted: 08/21/23 Time DME Agency Contacted: 1128 Representative spoke with at DME Agency: Shaune Leeks HH Arranged: RN, PT [facility] Agency: [facility] Date [facility] Agency Contacted: 08/21/23 Time HH Agency Contacted: 1128 Representative spoke with at [facility] Agency: Hassel Neth  Social Drivers of Health (SDOH) Interventions SDOH Screenings   Food Insecurity: No Food Insecurity (08/01/2023)  Housing: Low Risk  (08/01/2023)  Transportation Needs: No Transportation Needs (08/01/2023)  Utilities: Not At Risk (08/01/2023)  Financial Resource Strain: Low Risk  (05/07/2023)   Received from [facility]  Recent Concern: Financial Resource Strain - High Risk (03/21/2023)   Received from [facility]   Physical Activity: Sufficiently Active (05/07/2023)   Received from [facility]  Social Connections: Moderately Integrated (05/07/2023)   Received from [facility]  Stress: Stress Concern Present (05/07/2023)   Received from [facility]  Tobacco Use: High Risk (08/02/2023)     Readmission Risk Interventions    08/02/2023    1:28 PM 02/13/2023   11:23 AM 01/26/2023    3:33 PM  Readmission Risk Prevention Plan  Transportation Screening Complete Complete Complete  PCP or Specialist Appt within 3-5 Days  Complete Complete  HRI or Home Care Consult  Complete Complete  Social Work Consult for Recovery Care Planning/Counseling  Complete Complete  Palliative Care Screening  Complete Not Applicable  Medication Review Oceanographer) Complete Complete Complete  PCP or Specialist appointment within 3-5 days of discharge Complete    HRI or Home Care Consult Complete    SW Recovery Care/Counseling Consult Complete    Palliative Care Screening Not Applicable    Skilled Nursing Facility Not Applicable

## 2023-08-21 NOTE — TOC Transition Note (Incomplete)
Transition of Care [facility]) - Discharge Note   Patient Details  Name: KEYERRA LAMERE MRN: [id number] Date of Birth: [birth year]  Transition of Care [facility]) CM/SW Contact:  Reva Bores, LCSWA Phone Number: 08/21/2023, 11:35 AM   Clinical Narrative:       Final next level of care: Home w Home Health Services Barriers to Discharge: No Barriers Identified   Patient Goals and CMS Choice Patient states their goals for this hospitalization and ongoing recovery are:: wants to be able to manage her care at home CMS Medicare.gov Compare Post Acute Care list provided to:: Patient Choice offered to / list presented to : Patient [place] ownership interest in [facility].provided to:: Patient    Discharge Placement                       Discharge Plan and Services Additional resources added to the After Visit Summary for     Discharge Planning Services: CM Consult Post Acute Care Choice: Home Health          DME Arranged: Bipap   Date DME Agency Contacted: 08/21/23 Time DME Agency Contacted: 1128 Representative spoke with at DME Agency: Shaune Leeks HH Arranged: RN, PT [facility] Agency: [facility] Date [facility] Agency Contacted: 08/21/23 Time HH Agency Contacted: 1128 Representative spoke with at [facility] Agency: Hassel Neth  Social Drivers of Health (SDOH) Interventions SDOH Screenings   Food Insecurity: No Food Insecurity (08/01/2023)  Housing: Low Risk  (08/01/2023)  Transportation Needs: No Transportation Needs (08/01/2023)  Utilities: Not At Risk (08/01/2023)  Financial Resource Strain: Low Risk  (05/07/2023)   Received from [facility]  Recent Concern: Financial Resource Strain - High Risk (03/21/2023)   Received from [facility]  Physical Activity: Sufficiently Active (05/07/2023)   Received from [facility]  Social Connections: Moderately Integrated (05/07/2023)   Received from [facility]  Stress: Stress Concern Present (05/07/2023)   Received from  [facility]  Tobacco Use: High Risk (08/02/2023)     Readmission Risk Interventions    08/02/2023    1:28 PM 02/13/2023   11:23 AM 01/26/2023    3:33 PM  Readmission Risk Prevention Plan  Transportation Screening Complete Complete Complete  PCP or Specialist Appt within 3-5 Days  Complete Complete  HRI or Home Care Consult  Complete Complete  Social Work Consult for Recovery Care Planning/Counseling  Complete Complete  Palliative Care Screening  Complete Not Applicable  Medication Review Oceanographer) Complete Complete Complete  PCP or Specialist appointment within 3-5 days of discharge Complete    HRI or Home Care Consult Complete    SW Recovery Care/Counseling Consult Complete    Palliative Care Screening Not Applicable    Skilled Nursing Facility Not Applicable

## 2023-08-21 NOTE — Discharge Instructions (Signed)
Information on my medicine - ELIQUIS (apixaban)  This medication education was reviewed with me or my healthcare representative as part of my discharge preparation.   Why was Eliquis prescribed for you? Eliquis was prescribed for you to reduce the risk of forming blood clots that can reduce the risk of a blood clots forming.  What do You need to know about Eliquis ? Take your Eliquis TWICE DAILY - one tablet in the morning and one tablet in the evening with or without food.  It would be best to take the doses about the same time each day.  If you have difficulty swallowing the tablet whole please discuss with your pharmacist how to take the medication safely.  Take Eliquis exactly as prescribed by your doctor and DO NOT stop taking Eliquis without talking to the doctor who prescribed the medication.  Stopping may increase your risk of developing a new clot or stroke.  Refill your prescription before you run out.  After discharge, you should have regular check-up appointments with your healthcare provider that is prescribing your Eliquis.  In the future your dose may need to be changed if your kidney function or weight changes by a significant amount or as you get older.  What do you do if you miss a dose? If you miss a dose, take it as soon as you remember on the same day and resume taking twice daily.  Do not take more than one dose of ELIQUIS at the same time.  Important Safety Information A possible side effect of Eliquis is bleeding. You should call your healthcare provider right away if you experience any of the following: Bleeding from an injury or your nose that does not stop. Unusual colored urine (red or dark brown) or unusual colored stools (red or black). Unusual bruising for unknown reasons. A serious fall or if you hit your head (even if there is no bleeding).  Some medicines may interact with Eliquis and might increase your risk of bleeding or clotting while on  Eliquis. To help avoid this, consult your healthcare provider or pharmacist prior to using any new prescription or non-prescription medications, including herbals, vitamins, non-steroidal anti-inflammatory drugs (NSAIDs) and supplements.  This website has more information on Eliquis (apixaban): [email or web address].cy.

## 2023-08-21 NOTE — Discharge Summary (Addendum)
Physician Discharge Summary   Patient: Karen Bennett MRN: [id number] DOB: [birth year]  Admit date:     08/01/2023  Discharge date: 08/21/23  Discharge Physician: York Ram Coltan Spinello   PCP: [name], NP   Recommendations at discharge:    Patient will continue diuresis with torsemide 80 mg bid and spironolactone.  Limited pharmacologic therapy for heart failure due to risk of hypotension, holding on ARB, and B Blocker. No SGLT 2 inh due to risk of urine infections.  Follow up with [name] NP in 7 to 10 days.  Follow up renal function and electrolytes in 7 to 10 days.  Follow up with heart failure clinic as scheduled.   Discharge Diagnoses: Principal Problem:   Acute on chronic systolic heart failure (HCC) Active Problems:   Acute kidney injury superimposed on stage 3a chronic kidney disease (HCC)   History of pulmonary embolism   Hyperlipidemia associated with type 2 diabetes mellitus (HCC)   Normocytic anemia   Obesity, Class III, BMI 40-49.9 (morbid obesity) (HCC)   Hx of right BKA (HCC)   Hypothyroidism   History of CVA (cerebrovascular accident)  Resolved Problems:   * No resolved hospital problems. [facility] Course: Karen Bennett was admitted to the hospital with the working diagnosis of heart failure.   48/F with history of DVT/PE, chronic systolic CHF, obesity class 3, CVA, CKD 3 A, BKA, hypothyroidism, history of Candida endocarditis in 6/22 presented to the ED with progressive edema and dyspnea. She was placed on oral furosemide and then change to torsemide as outpatient with no significant improvement in her symptoms.  On her initial physical examination her blood pressure was 114/69, HR 79, RR 14 and 02 saturation 95%.  Lungs with decreased breath sounds with no wheezing, heart with S1 and S2 present and regular, no gallops, abdomen with no distention, positive left lower extremity edema and right BKA stump.   Na 132, K 5,1 Cl 104 bicarbonate 21,  glucose 151 bun 67 cr 2,0  BNP 523  High sensitive troponin 33 and 36  Wbc 6,3 hgb 12,3 plt 227   Chest radiograph with hypoinflation, cardiomegaly with bilateral hilar vascular congestion, bilateral interstitial central infiltrates, bilateral small pleural effusions, more right than left.   EKG 83 bpm, right axis deviation, right bundle branch block, left posterior fascicular block, qtc 510, sinus rhythm with 1st degree AV block, with no significant ST segment or T wave changes.   EF 35-40%, global hypokinesis, mildly reduced RV, severe LA elevated PASP Placed on aggressive diuretic regimen.  01/17 right CVC internal jugular vein.  01/19 creatinine worsened, transferred to [facility], initial Co. ox was 48%, started on milrinone.   [facility]  01/22 clinically improving, continue with inotropic support and high dose of IV diuretics Pain full edema has improved.  01/24 aggressive diuresis with sequential nephron blockade with improvement in volume status.  01/25 responding well to diuresis and inotropic support.  01/26 transitioned to po loop diuretic and started to wean milrinone  01/27 off milrinone today, plan for discharge home tomorrow.  01/28 patient doing well off inotropic support, plan for discharge home today.  Follow up as outpatient.  Assessment and Plan: * Acute on chronic systolic heart failure (HCC) Echocardiogram with reduced LV systolic function EF 35 to 40%, LV cavity with mild dilatation, severe akinesis of the left ventricular, mid inferior wall and inferolateral wall. RV systolic function low normal. 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  Acute on chronic core pulmonale Pulmonary hypertension.   Patient had aggressive diuresis, with sequential nephron blockade (acetazolamide, loop diuretics, metolazone and  spironolactone) and inotropic support with milrinone.  Negative fluid balance was achieved, - 17,048 ml with significant improvement in her symptoms.   Patient will continue at home torsemide 80 mg po bid, and spironolactone. No SLGT 2 inh due to risk of urine infection,  Possible addition   Painful edema lower extremities, continue with oxycodone to 5 mg every 6 hrs as needed.  Will avoid narcotic analgesics at home.   PVC, continue with mexiletine.   Acute kidney injury superimposed on stage 3a chronic kidney disease (HCC) Hyponatremia.   Volume status has improved, at the time of discharge serum cr at 2,21 with K at 3,9 and serum bicarbonate at 35  Na 138 P 3,9   Continue diuresis with torsemide and spironolactone.  Follow up renal function and electrolytes as outpatient.,   History of pulmonary embolism Continue anticoagulation with apixaban.   Hyperlipidemia associated with type 2 diabetes mellitus (HCC) Patient was placed on basal insulin and sliding scale for glucose cover and monitoring.  At the time of her discharge she will resume her insulin regimen. She is ob GLP1 agonist.    Normocytic anemia Cell count has been stable, last hgb 11.4   Obesity, Class III, BMI 40-49.9 (morbid obesity) (HCC) Calculated BMI is 63,4   Hx of right BKA (HCC) Right BKA, patient uses electric wheelchair at home.  Very poor mobility at baseline.   Pressure ulcer right buttock stage 2., left anterior thigh not able to stage, right posterior stump stage 2  Presumed present on admission,  Continue local skin care.   Hypothyroidism Continue with levothyroxine   History of CVA (cerebrovascular accident) Continue blood pressure control and statin          Consultants: cardiology, IR  Procedures performed: right central line   Disposition: Home Diet recommendation:  Cardiac and Carb modified diet DISCHARGE MEDICATION: Allergies as of 08/21/2023       Reactions    Clindamycin Diarrhea, Nausea And Vomiting   Zofran Itching, Nausea And Vomiting   Doxycycline Diarrhea, Nausea And Vomiting   Morphine And Codeine Hives, Itching, Rash        Medication List     STOP taking these medications    Jardiance 25 MG Tabs tablet Generic drug: empagliflozin   minoxidil 10 MG tablet Commonly known as: LONITEN   Oxycodone HCl 10 MG Tabs   potassium chloride 10 MEQ tablet Commonly known as: KLOR-CON M       TAKE these medications    apixaban 5 MG Tabs tablet Commonly known as: ELIQUIS Take 1 tablet (5 mg total) by mouth 2 (two) times daily.   atorvastatin 80 MG tablet Commonly known as: LIPITOR Take 1 tablet (80 mg total) by mouth daily at 6 PM. What changed:  medication strength how much to take   ezetimibe 10 MG tablet Commonly known as: ZETIA Take 1 tablet (10 mg total) by mouth daily. Start taking on: August 22, 2023   FreeStyle Libre 3 Sensor Misc Place 1 sensor on the skin every 14 days. Use to check glucose continuously   glucose blood test strip Use as instructed   glucose monitoring kit monitoring kit 1 each by Does not apply route as needed for other.   hydrOXYzine 50 MG capsule  Commonly known as: VISTARIL Take 50 mg by mouth 2 (two) times daily as needed for itching or anxiety.   insulin lispro 100 UNIT/ML injection Commonly known as: HUMALOG Inject 1-6 Units into the skin See admin instructions. Inject 1-6 units into the skin three times a day with meals, PER SLIDING SCALE   Lantus SoloStar 100 UNIT/ML Solostar Pen Generic drug: insulin glargine Inject 10 Units into the skin at bedtime.   levothyroxine 75 MCG tablet Commonly known as: SYNTHROID Take 75 mcg by mouth daily before breakfast.   methocarbamol 750 MG tablet Commonly known as: ROBAXIN Take 750 mg by mouth every 8 (eight) hours as needed for muscle spasms.   mexiletine 250 MG capsule Commonly known as: MEXITIL Take 1 capsule (250 mg total) by  mouth every 12 (twelve) hours.   Mounjaro 2.5 MG/0.5ML Pen Generic drug: tirzepatide Inject 2.5 mg into the skin every Wednesday.   promethazine 25 MG tablet Commonly known as: PHENERGAN Take 25 mg by mouth every 6 (six) hours as needed for nausea or vomiting.   spironolactone 25 MG tablet Commonly known as: ALDACTONE Take 0.5 tablets (12.5 mg total) by mouth daily. Start taking on: August 22, 2023   torsemide 20 MG tablet Commonly known as: DEMADEX Take 4 tablets (80 mg total) by mouth 2 (two) times daily. What changed:  how much to take when to take this   traZODone 50 MG tablet Commonly known as: DESYREL Take 50 mg by mouth at bedtime.   triamcinolone cream 0.5 % Commonly known as: KENALOG Apply 1 Application topically 3 (three) times daily as needed (for itching or irritation- affected areas).   TYLENOL 500 MG tablet Generic drug: acetaminophen Take 500-1,000 mg by mouth every 6 (six) hours as needed for mild pain (pain score 1-3) or headache.   venlafaxine XR 37.5 MG 24 hr capsule Commonly known as: EFFEXOR-XR Take 37.5 mg by mouth daily.        Follow-up Information     [facility] Follow up on 08/29/2023.   Specialty: Cardiology Why: at 2:00 Contact information: [address] [place] [address] [phone]               Discharge Exam: Ceasar Mons Weights   08/19/23 0352 08/20/23 0506 08/21/23 0432  Weight: (!) 177.6 kg (!) 168.2 kg (!) 167.8 kg   BP 127/78 (BP Location: Right Wrist)   Pulse 84   Temp 97.8 F (36.6 C) (Oral)   Resp 16   Ht 5\' 6"  (1.676 m)   Wt (!) 167.8 kg   LMP 04/13/2013   SpO2 98%   BMI 59.72 kg/m   Patient is feeling better, no dyspnea, no PND or orthopnea, significant improvement in edema.  Neurology awake and alert ENT With mild pallor Cardiovascular with S1 and S2 present and regular with no gallops, rubs or murmurs No JVD (wide neck) Respiratory  with no rales or wheezing, no rhonchi Abdomen with no distention  Right B BKA, non pitting edema on the left, she does have some component of lymphedema.   Condition at discharge: stable  The results of significant diagnostics from this hospitalization (including imaging, microbiology, ancillary and laboratory) are listed below for reference.   Imaging Studies: VAS [place] LOWER EXTREMITY VENOUS (DVT) Result Date: 08/13/2023  Lower Venous DVT Study Patient Name:  MACKENZE GRANDISON  Date of Exam:   08/13/2023 Medical Rec #: [id number]  Accession #:    [id number] Date of Birth: Sep 08, 1975       Patient Gender: F Patient Age:   12 years Exam [facility]:  [facility] Procedure:      VAS [place] LOWER EXTREMITY VENOUS (DVT) Referring Phys: Levon Hedger --------------------------------------------------------------------------------  Indications: Edema.  Risk Factors: None identified. Limitations: Body habitus, poor ultrasound/tissue interface and patient positioning. Comparison Study: No prior studies. Performing Technologist: Chanda Busing RVT  Examination Guidelines: A complete evaluation includes B-mode imaging, spectral Doppler, color Doppler, and power Doppler as needed of all accessible portions of each vessel. Bilateral testing is considered an integral part of a complete examination. Limited examinations for reoccurring indications may be performed as noted. The reflux portion of the exam is performed with the patient in reverse Trendelenburg.  +---------+---------------+---------+-----------+----------+-------------------+ RIGHT    CompressibilityPhasicitySpontaneityPropertiesThrombus Aging      +---------+---------------+---------+-----------+----------+-------------------+ CFV      Full           Yes      Yes                                      +---------+---------------+---------+-----------+----------+-------------------+ SFJ      Full                                                              +---------+---------------+---------+-----------+----------+-------------------+ FV Prox  Full                                                             +---------+---------------+---------+-----------+----------+-------------------+ FV Mid                  Yes      Yes                                      +---------+---------------+---------+-----------+----------+-------------------+ FV Distal               Yes      Yes                                      +---------+---------------+---------+-----------+----------+-------------------+ PFV                                                   Not well visualized +---------+---------------+---------+-----------+----------+-------------------+ POP      Full           Yes      Yes                                      +---------+---------------+---------+-----------+----------+-------------------+ PTV  Not well visualized +---------+---------------+---------+-----------+----------+-------------------+ PERO                                                  Not well visualized +---------+---------------+---------+-----------+----------+-------------------+   +---------+---------------+---------+-----------+----------+-------------------+ LEFT     CompressibilityPhasicitySpontaneityPropertiesThrombus Aging      +---------+---------------+---------+-----------+----------+-------------------+ CFV      Full           Yes      Yes                                      +---------+---------------+---------+-----------+----------+-------------------+ SFJ      Full                                                             +---------+---------------+---------+-----------+----------+-------------------+ FV Prox  Full                                                              +---------+---------------+---------+-----------+----------+-------------------+ FV Mid                  Yes      Yes                                      +---------+---------------+---------+-----------+----------+-------------------+ FV Distal               Yes      Yes                                      +---------+---------------+---------+-----------+----------+-------------------+ PFV                                                   Not well visualized +---------+---------------+---------+-----------+----------+-------------------+ POP      Full           Yes      Yes                                      +---------+---------------+---------+-----------+----------+-------------------+ PTV      Full                                                             +---------+---------------+---------+-----------+----------+-------------------+ PERO  Not well visualized +---------+---------------+---------+-----------+----------+-------------------+     Summary: RIGHT: - There is no evidence of deep vein thrombosis in the lower extremity. However, portions of this examination were limited- see technologist comments above.  - No cystic structure found in the popliteal fossa.  LEFT: - There is no evidence of deep vein thrombosis in the lower extremity. However, portions of this examination were limited- see technologist comments above.  - No cystic structure found in the popliteal fossa.  *See table(s) above for measurements and observations. Electronically signed by Carolynn Sayers on 08/13/2023 at 2:05:27 PM.    Final    IR US Guide Vasc Access Right Result Date: 08/11/2023 INDICATION: Poor intravenous access. Chronic renal insufficiency. Please perform image guided central venous catheter placement for durable intravenous access while admitted to the hospital. EXAM: ULTRASOUND AND FLUOROSCOPIC GUIDED NON TUNNELED CENTRAL VENOUS  CATHETER INSERTION MEDICATIONS: None. CONTRAST:  None FLUOROSCOPY TIME:  12 seconds (2 mGy) COMPLICATIONS: None immediate. TECHNIQUE: The procedure, risks, benefits, and alternatives were explained to the patient and informed written consent was obtained. A timeout was performed prior to the initiation of the procedure. The base of the right-side of the neck was prepped with chlorhexidine in a sterile fashion, and a sterile drape was applied covering the operative field. Maximum barrier sterile technique with sterile gowns and gloves were used for the procedure. A timeout was performed prior to the initiation of the procedure. Local anesthesia was provided with 1% lidocaine. Under direct ultrasound guidance, the right internal jugular vein was accessed with a micropuncture kit after the overlying soft tissues were anesthetized with 1% lidocaine. Real-time ultrasound guidance was utilized for vascular access including the acquisition of a permanent ultrasound image documenting patency of the accessed vessel. A guidewire was advanced to the level of the superior caval-atrial junction for measurement purposes and the central venous catheter was cut to length. A peel-away sheath was placed and a 19 cm, 5 [place], dual lumen was inserted to level of the superior caval-atrial junction. A post procedure spot fluoroscopic was obtained. The catheter easily aspirated and flushed and was an interrupted suture. A dressing was applied. The patient tolerated the procedure well without immediate post procedural complication. FINDINGS: After catheter placement, the tip lies within the superior cavoatrial junction. The catheter aspirates and flushes normally and is ready for immediate use. IMPRESSION: Successful ultrasound and fluoroscopic guided placement of a right internal jugular vein approach, 19 cm, 5 [place], dual lumen non tunneled central venous catheter with tip at the superior caval-atrial junction. The central venous  catheter is ready for immediate use. Electronically Signed   By: Simonne Come M.D.   On: 08/11/2023 10:56   [place] EKG SITE RITE Result Date: 08/10/2023 If Site Rite image not attached, placement could not be confirmed due to current cardiac rhythm.  ECHOCARDIOGRAM COMPLETE Result Date: 08/02/2023    ECHOCARDIOGRAM REPORT   Patient Name:   BRYLAN SEUBERT Date of Exam: 08/02/2023 Medical Rec #:  [id number]       Height:       66.0 in Accession #:    [id number]      Weight:       361.6 lb Date of Birth:  [birth year]      BSA:          2.572 m Patient Age:    47 years        BP:           117/70 mmHg Patient Gender: F  HR:           75 bpm. Exam Location:  Inpatient Procedure: 2D Echo, Cardiac Doppler, Color Doppler and Intracardiac            Opacification Agent Indications:    CHF acute- diastolic  History:        Patient has prior history of Echocardiogram examinations, most                 recent 03/23/2023. Risk Factors:Diabetes and Current Smoker.  Sonographer:    Karma Ganja Referring Phys: [id number] RIPUDEEP K RAI  Sonographer Comments: Technically challenging study due to limited acoustic windows and patient is obese. IMPRESSIONS  1. No apical thrombus with Definity. Left ventricular ejection fraction, by estimation, is 35 to 40%. Left ventricular ejection fraction by 2D MOD biplane is 40.8 %. The left ventricle has moderately decreased function. The left ventricle demonstrates regional wall motion abnormalities (see scoring diagram/findings for description). The left ventricular internal cavity size was mildly dilated. Left ventricular diastolic parameters are consistent with Grade III diastolic dysfunction (restrictive). Elevated left ventricular end-diastolic pressure. There is abnormal (paradoxical) septal motion, consistent with right ventricular volume overload. There is severe akinesis of the left ventricular, mid inferior wall and inferolateral wall.  2. Right ventricular systolic function is low  normal. The right ventricular size is normal. There is severely elevated pulmonary artery systolic pressure. The estimated right ventricular systolic pressure is 69.5 mmHg.  3. Left atrial size was moderately dilated.  4. Right atrial size was severely dilated.  5. The mitral valve is abnormal. Mild mitral valve regurgitation.  6. The tricuspid valve is abnormal. Tricuspid valve regurgitation is moderate.  7. The aortic valve is tricuspid. Aortic valve regurgitation is not visualized. No aortic stenosis is present.  8. The inferior vena cava is dilated in size with <50% respiratory variability, suggesting right atrial pressure of 15 mmHg. Comparison(s): Changes from prior study are noted. 03/23/2023: LVEF 45% with apical hypokinesis. Conclusion(s)/Recommendation(s): Compared to the prior study, there appear to be new regional WMA's - suggestive of LCX territory ischemia/infarct. FINDINGS  Left Ventricle: No apical thrombus with Definity. Left ventricular ejection fraction, by estimation, is 35 to 40%. Left ventricular ejection fraction by 2D MOD biplane is 40.8 %. The left ventricle has moderately decreased function. The left ventricle demonstrates regional wall motion abnormalities. Severe akinesis of the left ventricular, mid inferior wall and inferolateral wall. Definity contrast agent was given IV to delineate the left ventricular endocardial borders. The left ventricular internal cavity size was mildly dilated. There is no left ventricular hypertrophy. Abnormal (paradoxical) septal motion, consistent with right ventricular volume overload. Left ventricular diastolic parameters are consistent with Grade III diastolic dysfunction (restrictive). Elevated left ventricular end-diastolic pressure.  LV Wall Scoring: The antero-lateral wall and posterior wall are akinetic. Right Ventricle: The right ventricular size is normal. No increase in right ventricular wall thickness. Right ventricular systolic function is low  normal. There is severely elevated pulmonary artery systolic pressure. The tricuspid regurgitant velocity is 3.69 m/s, and with an assumed right atrial pressure of 15 mmHg, the estimated right ventricular systolic pressure is 69.5 mmHg. Left Atrium: Left atrial size was moderately dilated. Right Atrium: Right atrial size was severely dilated. Pericardium: There is no evidence of pericardial effusion. Mitral Valve: The mitral valve is abnormal. There is mild calcification of the anterior and posterior mitral valve leaflet(s). Mild mitral valve regurgitation. MV peak gradient, 7.6 mmHg. The mean mitral valve gradient is 3.0 mmHg. Tricuspid  Valve: The tricuspid valve is abnormal. Tricuspid valve regurgitation is moderate. Aortic Valve: The aortic valve is tricuspid. Aortic valve regurgitation is not visualized. No aortic stenosis is present. Aortic valve mean gradient measures 4.0 mmHg. Aortic valve peak gradient measures 7.4 mmHg. Aortic valve area, by VTI measures 1.70 cm. Pulmonic Valve: The pulmonic valve was grossly normal. Pulmonic valve regurgitation is trivial. Aorta: The aortic root and ascending aorta are structurally normal, with no evidence of dilitation. Venous: The inferior vena cava is dilated in size with less than 50% respiratory variability, suggesting right atrial pressure of 15 mmHg. IAS/Shunts: No atrial level shunt detected by color flow Doppler.  LEFT VENTRICLE PLAX 2D                        Biplane EF (MOD) LVIDd:         5.70 cm         LV Biplane EF:   Left LVIDs:         4.50 cm                          ventricular LV PW:         0.90 cm                          ejection LV IVS:        1.00 cm                          fraction by LVOT diam:     2.00 cm                          2D MOD LV SV:         41                               biplane is LV SV Index:   16                               40.8 %. LVOT Area:     3.14 cm                                Diastology                                 LV e' medial:    4.57 cm/s LV Volumes (MOD)               LV E/e' medial:  26.7 LV vol d, MOD    233.0 ml      LV e' lateral:   7.18 cm/s A2C:                           LV E/e' lateral: 17.0 LV vol d, MOD    219.0 ml A4C: LV vol s, MOD    139.0 ml A2C: LV vol s, MOD    119.0 ml A4C: LV SV MOD A2C:   94.0 ml LV SV MOD A4C:   219.0 ml LV SV MOD BP:  93.7 ml RIGHT VENTRICLE             IVC RV S prime:     10.30 cm/s  IVC diam: 2.60 cm TAPSE (M-mode): 2.2 cm LEFT ATRIUM              Index        RIGHT ATRIUM           Index LA diam:        4.70 cm  1.83 cm/m   RA Area:     39.90 cm LA Vol (A2C):   95.4 ml  37.09 ml/m  RA Volume:   170.00 ml 66.10 ml/m LA Vol (A4C):   99.0 ml  38.49 ml/m LA Biplane Vol: 106.0 ml 41.21 ml/m  AORTIC VALVE AV Area (Vmax):    1.72 cm AV Area (Vmean):   1.68 cm AV Area (VTI):     1.70 cm AV Vmax:           136.00 cm/s AV Vmean:          90.700 cm/s AV VTI:            0.244 m AV Peak Grad:      7.4 mmHg AV Mean Grad:      4.0 mmHg LVOT Vmax:         74.40 cm/s LVOT Vmean:        48.500 cm/s LVOT VTI:          0.132 m LVOT/AV VTI ratio: 0.54  AORTA Ao Root diam: 3.00 cm Ao Asc diam:  2.70 cm MITRAL VALVE                TRICUSPID VALVE MV Area (PHT): 3.08 cm     TR Peak grad:   54.5 mmHg MV Area VTI:   1.26 cm     TR Vmax:        369.00 cm/s MV Peak grad:  7.6 mmHg MV Mean grad:  3.0 mmHg     SHUNTS MV Vmax:       1.38 m/s     Systemic VTI:  0.13 m MV Vmean:      80.5 cm/s    Systemic Diam: 2.00 cm MV Decel Time: 246 msec MR Peak grad: 67.6 mmHg MR Vmax:      411.00 cm/s MV E velocity: 122.00 cm/s MV A velocity: 69.80 cm/s MV E/A ratio:  1.75 Zoila Shutter MD Electronically signed by Zoila Shutter MD Signature Date/Time: 08/02/2023/4:23:45 PM    Final    DG Chest Portable 1 View Result Date: 08/01/2023 CLINICAL DATA:  Shortness of breath and leg swelling. EXAM: PORTABLE CHEST 1 VIEW COMPARISON:  Chest x-ray dated February 09, 2023. CT chest dated January 25, 2023. FINDINGS: Unchanged  cardiomegaly. Pulmonary vascular congestion. Similar hazy density at both lung bases likely reflecting layering small pleural effusions with associated bibasilar atelectasis. No pneumothorax. No acute osseous abnormality. IMPRESSION: 1. Mild congestive heart failure. Electronically Signed   By: Obie Dredge M.D.   On: 08/01/2023 12:27    Microbiology: Results for orders placed or performed during the hospital encounter of 08/01/23  MRSA Next Gen by PCR, Nasal     Status: Abnormal   Collection Time: 08/10/23  4:15 PM   Specimen: Nasal Mucosa; Nasal Swab  Result Value Ref Range Status   MRSA by PCR Next Gen DETECTED (A) NOT DETECTED Final    Comment: CRITICAL RESULT CALLED TO, READ BACK BY AND VERIFIED WITH: SADE RUSSELL,  RN @ 2015 08/10/23 MH (NOTE) The GeneXpert MRSA Assay (FDA approved for NASAL specimens only), is one component of a comprehensive MRSA colonization surveillance program. It is not intended to diagnose MRSA infection nor to guide or monitor treatment for MRSA infections. Test performance is not FDA approved in patients less than 75 years old. Performed at [facility], [address] [place], [place] [address]     Labs: CBC: Recent Labs  Lab 08/21/23 0535  WBC 5.1  HGB 11.8*  HCT 37.0  MCV 87.3  PLT 241   Basic Metabolic Panel: Recent Labs  Lab 08/16/23 0450 08/16/23 1735 08/17/23 0407 08/18/23 0300 08/19/23 0431 08/20/23 0500 08/21/23 0536  NA 137   < > 137 137 135 136 138  K 4.1   < > 3.6 3.6 3.5 4.0 3.9  CL 97*   < > 96* 95* 92* 92* 94*  CO2 30   < > 33* 32 34* 33* 35*  GLUCOSE 148*   < > 129* 127* 205* 131* 118*  BUN 66*   < > 62* 59* 57* 53* 50*  CREATININE 2.14*   < > 2.10* 2.01* 1.95* 2.11* 2.21*  CALCIUM 8.4*   < > 8.5* 8.3* 8.3* 8.6* 9.0  MG 2.3  --  2.1 2.0 2.0 2.1  --   PHOS  --   --   --   --   --   --  3.9   < > = values in this interval not displayed.   Liver Function Tests: Recent Labs  Lab 08/21/23 0536   ALBUMIN 2.5*   CBG: Recent Labs  Lab 08/20/23 0622 08/20/23 1109 08/20/23 1602 08/20/23 2053 08/21/23 0624  GLUCAP 143* 163* 137* 110* 110*    Discharge time spent: greater than 30 minutes.  Signed: Coralie Keens, MD [facility] 08/21/2023

## 2023-08-29 ENCOUNTER — Encounter (HOSPITAL_COMMUNITY): Payer: 59

## 2023-09-22 ENCOUNTER — Other Ambulatory Visit: Payer: Self-pay

## 2023-09-22 ENCOUNTER — Inpatient Hospital Stay (HOSPITAL_COMMUNITY)
Admission: EM | Admit: 2023-09-22 | Discharge: 2023-10-13 | DRG: 291 | Disposition: A | Discharge status: Home Health | Attending: Family Medicine | Admitting: Family Medicine

## 2023-09-22 ENCOUNTER — Encounter (HOSPITAL_COMMUNITY): Payer: Self-pay

## 2023-09-22 ENCOUNTER — Emergency Department (HOSPITAL_COMMUNITY)

## 2023-09-22 DIAGNOSIS — I428 Other cardiomyopathies: Secondary | ICD-10-CM | POA: Diagnosis present

## 2023-09-22 DIAGNOSIS — Z888 Allergy status to other drugs, medicaments and biological substances status: Secondary | ICD-10-CM

## 2023-09-22 DIAGNOSIS — Z6841 Body Mass Index (BMI) 40.0 and over, adult: Secondary | ICD-10-CM

## 2023-09-22 DIAGNOSIS — E1122 Type 2 diabetes mellitus with diabetic chronic kidney disease: Secondary | ICD-10-CM | POA: Diagnosis present

## 2023-09-22 DIAGNOSIS — Z79890 Hormone replacement therapy (postmenopausal): Secondary | ICD-10-CM

## 2023-09-22 DIAGNOSIS — R600 Localized edema: Secondary | ICD-10-CM | POA: Diagnosis not present

## 2023-09-22 DIAGNOSIS — I081 Rheumatic disorders of both mitral and tricuspid valves: Secondary | ICD-10-CM | POA: Diagnosis present

## 2023-09-22 DIAGNOSIS — E119 Type 2 diabetes mellitus without complications: Secondary | ICD-10-CM

## 2023-09-22 DIAGNOSIS — N1831 Chronic kidney disease, stage 3a: Secondary | ICD-10-CM | POA: Diagnosis present

## 2023-09-22 DIAGNOSIS — E1165 Type 2 diabetes mellitus with hyperglycemia: Secondary | ICD-10-CM | POA: Diagnosis present

## 2023-09-22 DIAGNOSIS — R197 Diarrhea, unspecified: Secondary | ICD-10-CM | POA: Diagnosis present

## 2023-09-22 DIAGNOSIS — I5043 Acute on chronic combined systolic (congestive) and diastolic (congestive) heart failure: Secondary | ICD-10-CM | POA: Diagnosis present

## 2023-09-22 DIAGNOSIS — F32A Depression, unspecified: Secondary | ICD-10-CM | POA: Diagnosis present

## 2023-09-22 DIAGNOSIS — N179 Acute kidney failure, unspecified: Secondary | ICD-10-CM | POA: Diagnosis present

## 2023-09-22 DIAGNOSIS — E875 Hyperkalemia: Secondary | ICD-10-CM | POA: Diagnosis present

## 2023-09-22 DIAGNOSIS — F419 Anxiety disorder, unspecified: Secondary | ICD-10-CM | POA: Diagnosis present

## 2023-09-22 DIAGNOSIS — Z794 Long term (current) use of insulin: Secondary | ICD-10-CM | POA: Diagnosis not present

## 2023-09-22 DIAGNOSIS — Z823 Family history of stroke: Secondary | ICD-10-CM

## 2023-09-22 DIAGNOSIS — I2609 Other pulmonary embolism with acute cor pulmonale: Secondary | ICD-10-CM | POA: Diagnosis present

## 2023-09-22 DIAGNOSIS — I5023 Acute on chronic systolic (congestive) heart failure: Secondary | ICD-10-CM | POA: Diagnosis not present

## 2023-09-22 DIAGNOSIS — I130 Hypertensive heart and chronic kidney disease with heart failure and stage 1 through stage 4 chronic kidney disease, or unspecified chronic kidney disease: Secondary | ICD-10-CM | POA: Diagnosis not present

## 2023-09-22 DIAGNOSIS — Z8673 Personal history of transient ischemic attack (TIA), and cerebral infarction without residual deficits: Secondary | ICD-10-CM | POA: Diagnosis not present

## 2023-09-22 DIAGNOSIS — E1169 Type 2 diabetes mellitus with other specified complication: Secondary | ICD-10-CM | POA: Diagnosis present

## 2023-09-22 DIAGNOSIS — E039 Hypothyroidism, unspecified: Secondary | ICD-10-CM | POA: Diagnosis present

## 2023-09-22 DIAGNOSIS — E1142 Type 2 diabetes mellitus with diabetic polyneuropathy: Secondary | ICD-10-CM | POA: Diagnosis present

## 2023-09-22 DIAGNOSIS — Z515 Encounter for palliative care: Secondary | ICD-10-CM

## 2023-09-22 DIAGNOSIS — I509 Heart failure, unspecified: Secondary | ICD-10-CM

## 2023-09-22 DIAGNOSIS — D631 Anemia in chronic kidney disease: Secondary | ICD-10-CM | POA: Diagnosis present

## 2023-09-22 DIAGNOSIS — I2699 Other pulmonary embolism without acute cor pulmonale: Secondary | ICD-10-CM | POA: Diagnosis present

## 2023-09-22 DIAGNOSIS — E66813 Obesity, class 3: Secondary | ICD-10-CM | POA: Diagnosis present

## 2023-09-22 DIAGNOSIS — I4510 Unspecified right bundle-branch block: Secondary | ICD-10-CM | POA: Diagnosis present

## 2023-09-22 DIAGNOSIS — E876 Hypokalemia: Secondary | ICD-10-CM | POA: Diagnosis not present

## 2023-09-22 DIAGNOSIS — Z7901 Long term (current) use of anticoagulants: Secondary | ICD-10-CM

## 2023-09-22 DIAGNOSIS — E662 Morbid (severe) obesity with alveolar hypoventilation: Secondary | ICD-10-CM | POA: Diagnosis present

## 2023-09-22 DIAGNOSIS — E785 Hyperlipidemia, unspecified: Secondary | ICD-10-CM | POA: Diagnosis not present

## 2023-09-22 DIAGNOSIS — N184 Chronic kidney disease, stage 4 (severe): Secondary | ICD-10-CM | POA: Diagnosis present

## 2023-09-22 DIAGNOSIS — E871 Hypo-osmolality and hyponatremia: Secondary | ICD-10-CM | POA: Diagnosis present

## 2023-09-22 DIAGNOSIS — Z79899 Other long term (current) drug therapy: Secondary | ICD-10-CM

## 2023-09-22 DIAGNOSIS — Z89511 Acquired absence of right leg below knee: Secondary | ICD-10-CM | POA: Diagnosis not present

## 2023-09-22 DIAGNOSIS — Z86711 Personal history of pulmonary embolism: Secondary | ICD-10-CM | POA: Diagnosis not present

## 2023-09-22 DIAGNOSIS — Z7984 Long term (current) use of oral hypoglycemic drugs: Secondary | ICD-10-CM

## 2023-09-22 DIAGNOSIS — R601 Generalized edema: Secondary | ICD-10-CM

## 2023-09-22 DIAGNOSIS — I5082 Biventricular heart failure: Secondary | ICD-10-CM | POA: Diagnosis not present

## 2023-09-22 DIAGNOSIS — Z8249 Family history of ischemic heart disease and other diseases of the circulatory system: Secondary | ICD-10-CM

## 2023-09-22 DIAGNOSIS — Z885 Allergy status to narcotic agent status: Secondary | ICD-10-CM

## 2023-09-22 DIAGNOSIS — Z7189 Other specified counseling: Secondary | ICD-10-CM | POA: Diagnosis not present

## 2023-09-22 DIAGNOSIS — N189 Chronic kidney disease, unspecified: Secondary | ICD-10-CM | POA: Diagnosis not present

## 2023-09-22 DIAGNOSIS — E8809 Other disorders of plasma-protein metabolism, not elsewhere classified: Secondary | ICD-10-CM | POA: Diagnosis present

## 2023-09-22 DIAGNOSIS — R079 Chest pain, unspecified: Secondary | ICD-10-CM | POA: Diagnosis not present

## 2023-09-22 DIAGNOSIS — Z833 Family history of diabetes mellitus: Secondary | ICD-10-CM

## 2023-09-22 DIAGNOSIS — I2510 Atherosclerotic heart disease of native coronary artery without angina pectoris: Secondary | ICD-10-CM | POA: Diagnosis present

## 2023-09-22 DIAGNOSIS — I708 Atherosclerosis of other arteries: Secondary | ICD-10-CM | POA: Diagnosis present

## 2023-09-22 DIAGNOSIS — I2721 Secondary pulmonary arterial hypertension: Secondary | ICD-10-CM | POA: Diagnosis present

## 2023-09-22 DIAGNOSIS — Z86718 Personal history of other venous thrombosis and embolism: Secondary | ICD-10-CM

## 2023-09-22 LAB — COMPREHENSIVE METABOLIC PANEL
ALT: 9 U/L (ref 0–44)
AST: 9 U/L — ABNORMAL LOW (ref 15–41)
Albumin: 2.9 g/dL — ABNORMAL LOW (ref 3.5–5.0)
Alkaline Phosphatase: 63 U/L (ref 38–126)
Anion gap: 9 (ref 5–15)
BUN: 73 mg/dL — ABNORMAL HIGH (ref 6–20)
CO2: 28 mmol/L (ref 22–32)
Calcium: 8.7 mg/dL — ABNORMAL LOW (ref 8.9–10.3)
Chloride: 94 mmol/L — ABNORMAL LOW (ref 98–111)
Creatinine, Ser: 3.29 mg/dL — ABNORMAL HIGH (ref 0.44–1.00)
GFR, Estimated: 17 mL/min — ABNORMAL LOW (ref >60)
Glucose, Bld: 133 mg/dL — ABNORMAL HIGH (ref 70–99)
Potassium: 5 mmol/L (ref 3.5–5.1)
Sodium: 131 mmol/L — ABNORMAL LOW (ref 135–145)
Total Bilirubin: 0.9 mg/dL (ref 0.0–1.2)
Total Protein: 7.4 g/dL (ref 6.5–8.1)

## 2023-09-22 LAB — CBC WITH DIFFERENTIAL/PLATELET
Abs Immature Granulocytes: 0.02 10*3/uL (ref 0.00–0.07)
Basophils Absolute: 0 10*3/uL (ref 0.0–0.1)
Basophils Relative: 1 %
Eosinophils Absolute: 0.1 10*3/uL (ref 0.0–0.5)
Eosinophils Relative: 2 %
HCT: 35.9 % — ABNORMAL LOW (ref 36.0–46.0)
Hemoglobin: 11 g/dL — ABNORMAL LOW (ref 12.0–15.0)
Immature Granulocytes: 0 %
Lymphocytes Relative: 23 %
Lymphs Abs: 1.3 10*3/uL (ref 0.7–4.0)
MCH: 26.8 pg (ref 26.0–34.0)
MCHC: 30.6 g/dL (ref 30.0–36.0)
MCV: 87.6 fL (ref 80.0–100.0)
Monocytes Absolute: 0.5 10*3/uL (ref 0.1–1.0)
Monocytes Relative: 9 %
Neutro Abs: 3.7 10*3/uL (ref 1.7–7.7)
Neutrophils Relative %: 65 %
Platelets: 248 10*3/uL (ref 150–400)
RBC: 4.1 MIL/uL (ref 3.87–5.11)
RDW: 14.4 % (ref 11.5–15.5)
WBC: 5.6 10*3/uL (ref 4.0–10.5)
nRBC: 0 % (ref 0.0–0.2)

## 2023-09-22 LAB — I-STAT CHEM 8, ED
BUN: 73 mg/dL — ABNORMAL HIGH (ref 6–20)
Calcium, Ion: 1.09 mmol/L — ABNORMAL LOW (ref 1.15–1.40)
Chloride: 96 mmol/L — ABNORMAL LOW (ref 98–111)
Creatinine, Ser: 3.7 mg/dL — ABNORMAL HIGH (ref 0.44–1.00)
Glucose, Bld: 135 mg/dL — ABNORMAL HIGH (ref 70–99)
HCT: 36 % (ref 36.0–46.0)
Hemoglobin: 12.2 g/dL (ref 12.0–15.0)
Potassium: 5.9 mmol/L — ABNORMAL HIGH (ref 3.5–5.1)
Sodium: 130 mmol/L — ABNORMAL LOW (ref 135–145)
TCO2: 29 mmol/L (ref 22–32)

## 2023-09-22 LAB — BRAIN NATRIURETIC PEPTIDE: B Natriuretic Peptide: 572.3 pg/mL — ABNORMAL HIGH (ref 0.0–100.0)

## 2023-09-22 LAB — MAGNESIUM: Magnesium: 2.2 mg/dL (ref 1.7–2.4)

## 2023-09-22 LAB — GLUCOSE, CAPILLARY
Glucose-Capillary: 122 mg/dL — ABNORMAL HIGH (ref 70–99)
Glucose-Capillary: 75 mg/dL (ref 70–99)

## 2023-09-22 LAB — TSH: TSH: 4.243 u[IU]/mL (ref 0.350–4.500)

## 2023-09-22 LAB — LACTIC ACID, PLASMA: Lactic Acid, Venous: 0.7 mmol/L (ref 0.5–1.9)

## 2023-09-22 MED ORDER — HYDROXYZINE PAMOATE 50 MG PO CAPS: 50.0000 mg | ORAL_CAPSULE | Freq: Two times a day (BID) | ORAL | Status: DC | PRN

## 2023-09-22 MED ORDER — LEVOTHYROXINE SODIUM 75 MCG PO TABS
75.0000 ug | ORAL_TABLET | Freq: Every day | ORAL | Status: DC
  Administered 2023-09-23 – 2023-10-13 (×21): 75 ug via ORAL
  Filled 2023-09-22 (×21): qty 1

## 2023-09-22 MED ORDER — ACETAMINOPHEN 500 MG PO TABS
1000.0000 mg | ORAL_TABLET | Freq: Once | ORAL | Status: AC
  Administered 2023-09-22: 1000 mg via ORAL
  Filled 2023-09-22: qty 2

## 2023-09-22 MED ORDER — EZETIMIBE 10 MG PO TABS
10.0000 mg | ORAL_TABLET | Freq: Every day | ORAL | Status: DC
  Administered 2023-09-22 – 2023-10-13 (×22): 10 mg via ORAL
  Filled 2023-09-22 (×22): qty 1

## 2023-09-22 MED ORDER — TRAZODONE HCL 100 MG PO TABS: 100.0000 mg | ORAL_TABLET | Freq: Every day | ORAL | Status: DC

## 2023-09-22 MED ORDER — IPRATROPIUM-ALBUTEROL 0.5-2.5 (3) MG/3ML IN SOLN
3.0000 mL | Freq: Once | RESPIRATORY_TRACT | Status: AC
  Administered 2023-09-22: 3 mL via RESPIRATORY_TRACT
  Filled 2023-09-22: qty 3

## 2023-09-22 MED ORDER — APIXABAN 5 MG PO TABS
5.0000 mg | ORAL_TABLET | Freq: Two times a day (BID) | ORAL | Status: DC
  Administered 2023-09-22 – 2023-10-13 (×43): 5 mg via ORAL
  Filled 2023-09-22 (×43): qty 1

## 2023-09-22 MED ORDER — FUROSEMIDE 10 MG/ML IJ SOLN
40.0000 mg | Freq: Two times a day (BID) | INTRAMUSCULAR | Status: DC
  Administered 2023-09-22 – 2023-09-25 (×7): 40 mg via INTRAVENOUS
  Filled 2023-09-22 (×7): qty 4

## 2023-09-22 MED ORDER — INSULIN ASPART 100 UNIT/ML IJ SOLN
0.0000 [IU] | Freq: Three times a day (TID) | INTRAMUSCULAR | Status: DC
  Administered 2023-09-22 – 2023-10-12 (×16): 2 [IU] via SUBCUTANEOUS
  Administered 2023-10-13: 3 [IU] via SUBCUTANEOUS
  Filled 2023-09-22: qty 0.15

## 2023-09-22 MED ORDER — SODIUM BICARBONATE 8.4 % IV SOLN
50.0000 meq | Freq: Once | INTRAVENOUS | Status: AC
  Administered 2023-09-22: 50 meq via INTRAVENOUS
  Filled 2023-09-22: qty 50

## 2023-09-22 MED ORDER — ACETAMINOPHEN 650 MG RE SUPP: 650.0000 mg | Freq: Four times a day (QID) | RECTAL | Status: DC | PRN

## 2023-09-22 MED ORDER — SODIUM ZIRCONIUM CYCLOSILICATE 10 G PO PACK
10.0000 g | PACK | ORAL | Status: AC
  Administered 2023-09-22: 10 g via ORAL
  Filled 2023-09-22: qty 1

## 2023-09-22 MED ORDER — INSULIN GLARGINE-YFGN 100 UNIT/ML ~~LOC~~ SOLN
10.0000 [IU] | Freq: Every day | SUBCUTANEOUS | Status: DC
  Administered 2023-09-22 – 2023-09-26 (×4): 10 [IU] via SUBCUTANEOUS
  Filled 2023-09-22 (×7): qty 0.1

## 2023-09-22 MED ORDER — ATORVASTATIN CALCIUM 40 MG PO TABS
40.0000 mg | ORAL_TABLET | Freq: Every evening | ORAL | Status: DC
  Administered 2023-09-22 – 2023-10-12 (×21): 40 mg via ORAL
  Filled 2023-09-22 (×21): qty 1

## 2023-09-22 MED ORDER — MINOXIDIL 2.5 MG PO TABS: 5.0000 mg | ORAL_TABLET | Freq: Every day | ORAL | Status: DC

## 2023-09-22 MED ORDER — MEXILETINE HCL 250 MG PO CAPS
250.0000 mg | ORAL_CAPSULE | Freq: Two times a day (BID) | ORAL | Status: DC
  Administered 2023-09-22 – 2023-10-13 (×43): 250 mg via ORAL
  Filled 2023-09-22 (×45): qty 1

## 2023-09-22 MED ORDER — OXYCODONE HCL 5 MG PO TABS
15.0000 mg | ORAL_TABLET | Freq: Four times a day (QID) | ORAL | Status: DC | PRN
  Administered 2023-09-22 – 2023-09-25 (×11): 15 mg via ORAL
  Filled 2023-09-22 (×11): qty 3

## 2023-09-22 MED ORDER — FUROSEMIDE 10 MG/ML IJ SOLN
40.0000 mg | Freq: Once | INTRAMUSCULAR | Status: AC
  Administered 2023-09-22: 40 mg via INTRAVENOUS
  Filled 2023-09-22: qty 4

## 2023-09-22 MED ORDER — ACETAMINOPHEN 325 MG PO TABS
650.0000 mg | ORAL_TABLET | Freq: Four times a day (QID) | ORAL | Status: DC | PRN
  Administered 2023-09-25 – 2023-10-13 (×9): 650 mg via ORAL
  Filled 2023-09-22 (×9): qty 2

## 2023-09-22 MED ORDER — INSULIN ASPART 100 UNIT/ML IJ SOLN
0.0000 [IU] | Freq: Every day | INTRAMUSCULAR | Status: DC
  Filled 2023-09-22: qty 0.05

## 2023-09-22 NOTE — ED Notes (Signed)
 Per patient, dexcom reads 105. Requested we not finger stick her for CBG.

## 2023-09-22 NOTE — ED Triage Notes (Signed)
 Pt from home with c/o bilateral edema in lower extremities x4 days. Denies N/V, diarrhea, SOB, coughing. Pain 6/10. Hx HTN. Takes torsemide daily.   180/110- HR 73- RR 20- 94%

## 2023-09-22 NOTE — H&P (Signed)
 History and Physical    Karen Bennett UEA:[id number] DOB: 13-Sep-1975 DOA: 09/22/2023  PCP: [name], NP  Patient coming from: Home  Chief Complaint: Leg swelling  HPI: Karen Bennett is a 48 y.o. 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  Patient was admitted last month 1/8-1/28 for CHF exacerbation requiring aggressive diuresis and inotropic support.  She was discharged on torsemide 80 mg twice daily and spironolactone 12.5 mg daily.  Patient presents to the ED today for evaluation of lower extremity edema.  Vital signs on arrival: Temperature 97.9 F, pulse 80, respiratory rate 17, blood pressure 125/84, and SpO2 96% on room air. CBC showing WBC count 5.6, hemoglobin 11.0 (stable), platelet count 248k.  I-STAT chemistry showing sodium 130, potassium 5.9, chloride 96, bicarb 29, BUN 73, creatinine 3.7 (was 2.2 at the time of discharge a month ago), glucose 135.  BNP 572.  Chest x-ray showing findings of CHF with layering bilateral pleural effusions. Patient was given Tylenol, IV Lasix 40 mg, DuoNeb, IV sodium bicarb, and Lokelma.  TRH called to admit.  Patient is reporting worsening swelling of both of her legs since she left the hospital.  She is having pain in her legs due to significant swelling and requesting a medication stronger than Tylenol.  Also endorsing dyspnea with exertion.  Denies chest pain.  She reports compliance with all of her home medications including torsemide, spironolactone, and Eliquis.  Denies increased dietary sodium or fluid intake.  No other complaints.  Review of Systems:  Review of Systems  All other systems reviewed and are negative.   Past  Medical History:  Diagnosis Date   Anxiety and depression 06/26/2013   Cellulitis    Chronic kidney disease, stage 3a (HCC) 01/11/2023   Diabetes mellitus    Hyperlipidemia associated with type 2 diabetes mellitus (HCC) 05/02/2013   Hypertension associated with diabetes (HCC) 12/27/2020   Hypothyroidism    Infective endocarditis    PE (pulmonary embolism) ~2007-2008   Not on anticoagulation    Past Surgical History:  Procedure Laterality Date   Abscess removal from L groin     APPLICATION OF WOUND VAC Left 05/01/2013   Procedure: APPLICATION OF WOUND VAC;  Surgeon: Kerrin Champagne, MD;  Location: [facility];  Service: Orthopedics;  Laterality: Left;   BELOW KNEE LEG AMPUTATION Right    CYSTOSCOPY W/ URETERAL STENT PLACEMENT Right 09/03/2021   Procedure: CYSTOSCOPY WITH RETROGRADE PYELOGRAM/URETERAL STENT PLACEMENT;  Surgeon: Marcine Matar, MD;  Location: [facility];  Service: Urology;  Laterality: Right;   I & D EXTREMITY  11/12/2011   Procedure: IRRIGATION AND DEBRIDEMENT EXTREMITY;  Surgeon: Kathryne Hitch, MD;  Location: [facility];  Service: Orthopedics;  Laterality: Left;  foot left   I & D EXTREMITY Left 09/06/2012   Procedure: IRRIGATION AND DEBRIDEMENT EXTREMITY;  Surgeon: Eldred Manges, MD;  Location: [facility];  Service: Orthopedics;  Laterality: Left;   I & D EXTREMITY Left 05/01/2013   Procedure: INCISION AND DRAINAGE LEFT FOREFOOT ABCESS ;  Surgeon: Kerrin Champagne, MD;  Location: [facility];  Service: Orthopedics;  Laterality: Left;   INCISION AND DRAINAGE ABSCESS Left 12/27/2020   Procedure: INCISION AND DRAINAGE LEFT THIGH ABSCESS;  Surgeon: Berna Bue, MD;  Location: [facility];  Service: General;  Laterality: Left;   IR FLUORO GUIDE CV LINE RIGHT  09/12/2021   IR PTA VENOUS EXCEPT DIALYSIS CIRCUIT  01/04/2021   IR RADIOLOGIST EVAL & MGMT  10/25/2021   IR US GUIDE VASC ACCESS RIGHT  09/12/2021   IR US GUIDE VASC ACCESS RIGHT  08/10/2023   IR VENO/EXT/UNI RIGHT  01/04/2021    Surgery to remove hematoma in L leg     TEE WITHOUT CARDIOVERSION N/A 01/03/2021   Procedure: TRANSESOPHAGEAL ECHOCARDIOGRAM (TEE);  Surgeon: Quintella Reichert, MD;  Location: [facility] ENDOSCOPY;  Service: Cardiovascular;  Laterality: N/A;   TEE WITHOUT CARDIOVERSION N/A 03/23/2023   Procedure: TRANSESOPHAGEAL ECHOCARDIOGRAM;  Surgeon: [name], MD;  Location: [facility] INVASIVE CV LAB;  Service: Cardiovascular;  Laterality: N/A;   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     reports that she has been smoking cigarettes. She has never been exposed to tobacco smoke. She has never used smokeless tobacco. She reports that she does not drink alcohol and does not use drugs.  Allergies  Allergen Reactions   Clindamycin Diarrhea and Nausea And Vomiting   Zofran Itching and Nausea And Vomiting   Doxycycline Diarrhea and Nausea And Vomiting   Morphine And Codeine Hives, Itching and Rash    Family History  Problem Relation Age of Onset   Diabetes Mother    Coronary artery disease Mother    Stroke Mother     Prior to Admission medications   Medication Sig Start Date End Date Taking? Authorizing Provider  apixaban (ELIQUIS) 5 MG TABS tablet Take 1 tablet (5 mg total) by mouth 2 (two) times daily. 03/23/23  Yes Rhetta Mura, MD  atorvastatin (LIPITOR) 40 MG tablet Take 40 mg by mouth every evening.   Yes [provider]  ezetimibe (ZETIA) 10 MG tablet Take 10 mg by mouth daily.   Yes [provider]  hydrOXYzine (VISTARIL) 50 MG capsule Take 50 mg by mouth 2 (two) times daily as needed for itching or anxiety.   Yes [provider]  insulin lispro (HUMALOG) 100 UNIT/ML injection Inject 1-6 Units into the skin See admin instructions. Inject 1-6 units into the skin three times a day with meals, PER SLIDING SCALE   Yes [provider]  JARDIANCE 25 MG TABS tablet Take 25 mg by mouth daily. 09/04/23  Yes [provider]  LANTUS SOLOSTAR 100 UNIT/ML  Solostar Pen Inject 10 Units into the skin at bedtime.   Yes [provider]  levothyroxine (SYNTHROID) 75 MCG tablet Take 75 mcg by mouth daily before breakfast.   Yes [provider]  methocarbamol (ROBAXIN) 750 MG tablet Take 750 mg by mouth every 8 (eight) hours as needed for muscle spasms. 07/09/23  Yes [provider]  mexiletine (MEXITIL) 250 MG capsule Take 1 capsule (250 mg total) by mouth every 12 (twelve) hours. 08/21/23  Yes Arrien, York Ram, MD  minoxidil (LONITEN) 10 MG tablet Take 5 mg by mouth daily. 09/15/23  Yes [provider]  oxyCODONE (ROXICODONE) 15 MG immediate release tablet Take 15 mg by mouth every 6 (six) hours as needed. 09/18/23 09/28/23 Yes [provider]  potassium chloride (KLOR-CON) 10 MEQ tablet Take 10 mEq by mouth 2 (two) times daily.   Yes [provider]  spironolactone (ALDACTONE) 25 MG tablet Take 12.5 mg by mouth daily.   Yes [provider]  torsemide (DEMADEX) 20 MG tablet Take 80 mg by mouth daily.  Yes [provider]  traZODone (DESYREL) 100 MG tablet Take 100 mg by mouth at bedtime.   Yes [provider]  venlafaxine XR (EFFEXOR-XR) 75 MG 24 hr capsule Take 75 mg by mouth daily with breakfast.   Yes [provider]  atorvastatin (LIPITOR) 80 MG tablet Take 1 tablet (80 mg total) by mouth daily at 6 PM. 08/21/23 09/20/23  Arrien, York Ram, MD  Continuous Glucose Sensor (FREESTYLE LIBRE 3 SENSOR) MISC Place 1 sensor on the skin every 14 days. Use to check glucose continuously 03/23/23   Rhetta Mura, MD  ezetimibe (ZETIA) 10 MG tablet Take 1 tablet (10 mg total) by mouth daily. 08/22/23 09/21/23  Arrien, York Ram, MD  glucose blood test strip Use as instructed 06/26/13   Doris Cheadle, MD  glucose monitoring kit (FREESTYLE) monitoring kit 1 each by Does not apply route as needed for other. 06/26/13   Doris Cheadle, MD  MOUNJARO 2.5 MG/0.5ML Pen  Inject 2.5 mg into the skin every Wednesday.   Yes [provider]  promethazine (PHENERGAN) 25 MG tablet Take 25 mg by mouth every 6 (six) hours as needed for nausea or vomiting.   Yes [provider]  spironolactone (ALDACTONE) 25 MG tablet Take 0.5 tablets (12.5 mg total) by mouth daily. 08/22/23 09/21/23  Arrien, York Ram, MD  torsemide (DEMADEX) 20 MG tablet Take 4 tablets (80 mg total) by mouth 2 (two) times daily. 08/21/23 09/20/23  Coralie Keens, MD    Physical Exam: Vitals:   09/22/23 2956 09/22/23 0210 09/22/23 0400 09/22/23 0431  BP:   (!) 159/94   Pulse:      Resp:   17   Temp:    98.1 F (36.7 C)  TempSrc:    Oral  SpO2:  97%    Weight: (!) 163.3 kg     Height: 5\' 6"  (1.676 m)       Physical Exam Vitals reviewed.  Constitutional:      General: She is not in acute distress. HENT:     Head: Normocephalic and atraumatic.  Eyes:     Extraocular Movements: Extraocular movements intact.  Neck:     Comments: +JVD Cardiovascular:     Rate and Rhythm: Normal rate and regular rhythm.     Pulses: Normal pulses.  Pulmonary:     Effort: Pulmonary effort is normal. No respiratory distress.     Breath sounds: Rales present. No wheezing.  Abdominal:     General: Bowel sounds are normal. There is no distension.     Palpations: Abdomen is soft.     Tenderness: There is no abdominal tenderness. There is no guarding.  Musculoskeletal:     Cervical back: Normal range of motion.     Comments: Significant pitting edema of left lower extremity and right BKA stump  Skin:    General: Skin is warm and dry.  Neurological:     General: No focal deficit present.     Mental Status: She is alert and oriented to person, place, and time.     Labs on Admission: I have personally reviewed following labs and imaging studies  CBC: Recent Labs  Lab 09/22/23 0215 09/22/23 0220  WBC 5.6  --   NEUTROABS 3.7  --   HGB 11.0* 12.2  HCT 35.9* 36.0  MCV 87.6   --   PLT 248  --    Basic Metabolic Panel: Recent Labs  Lab 09/22/23 0220  NA 130*  K 5.9*  CL 96*  GLUCOSE 135*  BUN 73*  CREATININE 3.70*   GFR: Estimated Creatinine Clearance: 30.3 mL/min (A) (by C-G formula based on SCr of 3.7 mg/dL (H)). Liver Function Tests: No results for input(s): "AST", "ALT", "ALKPHOS", "BILITOT", "PROT", "ALBUMIN" in the last 168 hours. No results for input(s): "LIPASE", "AMYLASE" in the last 168 hours. No results for input(s): "AMMONIA" in the last 168 hours. Coagulation Profile: No results for input(s): "INR", "PROTIME" in the last 168 hours. Cardiac Enzymes: No results for input(s): "CKTOTAL", "CKMB", "CKMBINDEX", "TROPONINI" in the last 168 hours. BNP (last 3 results) No results for input(s): "PROBNP" in the last 8760 hours. HbA1C: No results for input(s): "HGBA1C" in the last 72 hours. CBG: No results for input(s): "GLUCAP" in the last 168 hours. Lipid Profile: No results for input(s): "CHOL", "HDL", "LDLCALC", "TRIG", "CHOLHDL", "LDLDIRECT" in the last 72 hours. Thyroid Function Tests: No results for input(s): "TSH", "T4TOTAL", "FREET4", "T3FREE", "THYROIDAB" in the last 72 hours. Anemia Panel: No results for input(s): "VITAMINB12", "FOLATE", "FERRITIN", "TIBC", "IRON", "RETICCTPCT" in the last 72 hours. Urine analysis:    Component Value Date/Time   COLORURINE AMBER (A) 03/22/2023 0426   APPEARANCEUR TURBID (A) 03/22/2023 0426   LABSPEC 1.040 (H) 03/22/2023 0426   PHURINE 5.0 03/22/2023 0426   GLUCOSEU >=500 (A) 03/22/2023 0426   HGBUR LARGE (A) 03/22/2023 0426   BILIRUBINUR NEGATIVE 03/22/2023 0426   KETONESUR NEGATIVE 03/22/2023 0426   PROTEINUR >=300 (A) 03/22/2023 0426   UROBILINOGEN 0.2 09/05/2011 0558   NITRITE POSITIVE (A) 03/22/2023 0426   LEUKOCYTESUR MODERATE (A) 03/22/2023 0426    Radiological Exams on Admission: DG Chest Portable 1 View Result Date: 09/22/2023 CLINICAL DATA:  Bilateral lower extremity edema.  Shortness of breath EXAM: PORTABLE CHEST 1 VIEW COMPARISON:  08/01/2023 FINDINGS: Stable cardiomegaly. Diffuse interstitial coarsening and pulmonary vascular congestion. Hazy opacities over the lower lungs likely due to layering pleural effusions. No pneumothorax. No displaced rib fractures. IMPRESSION: Congestive heart failure with layering bilateral pleural effusions. Electronically Signed   By: Minerva Fester M.D.   On: 09/22/2023 03:10    EKG: Independently reviewed.  Sinus rhythm with first-degree AV block, RBBB, QTc 535.  No acute ischemic changes.  Assessment and Plan  Acute on chronic HFrEF Severe pulmonary hypertension Acute on chronic cor pulmonale Last echo done 08/02/2023 showing EF 35 to 40%, grade 3 diastolic dysfunction, severe akinesis of the left ventricular mid inferior wall and inferolateral wall, RV systolic function low normal, severely elevated pulmonary artery systolic pressure, left atrium moderately dilated, right atrium severely dilated, mild mitral regurgitation, moderate tricuspid regurgitation.  Patient is presenting with volume overload and worsening lower extremity edema. BNP 572.  Chest x-ray showing findings of CHF with layering bilateral pleural effusions.  Not hypoxic.  Patient was given IV Lasix 40 mg in the ED. Continue home oxycodone PRN leg pain related to significant edema.  Monitor intake and output, daily weights.  Low-sodium diet with fluid restriction.  Consult cardiology in the morning.  AKI on CKD stage IIIa Possibly cardiorenal in the setting of acutely decompensated heart failure.  Creatinine was previously 2.2 at the time of discharge a month ago and has now increased to 3.7.  Patient was given IV Lasix for diuresis.  Continue to monitor renal function and avoid nephrotoxic agents/contrast.  Hyperkalemia In the setting of acute on chronic kidney injury.  I-STAT chemistry showing potassium 5.9.  Patient was given IV Lasix, albuterol, IV sodium bicarb,  and Lokelma in the ED.  Repeat labs ordered.  Mild hypervolemic hyponatremia Patient was given IV Lasix for diuresis.  Repeat labs ordered.  QT prolongation QTc 535 on EKG but i-STAT chemistry showing hyperkalemia? Patient is on mexiletine at home due to history of PVCs.  Follow-up repeat EKG in the morning and resume mexiletine if okay with cardiology.  Also holding venlafaxine at this time.  Monitor potassium and magnesium levels.  Hyperlipidemia Continue Lipitor and Zetia.  Insulin-dependent type 2 diabetes Well-controlled-A1c 7.2 on 08/01/2023.  Continue home long-acting insulin.  Moderate sliding scale insulin ACHS.  Hypothyroidism Continue Synthroid.  History of DVT/PE Continue Eliquis.  History of CVA Continue Eliquis and statin.  DVT prophylaxis: Eliquis Code Status: Full Code (discussed with the patient) Family Communication: No family available at this time. Level of care: Telemetry bed Admission status: It is my clinical opinion that admission to INPATIENT is reasonable and necessary because of the expectation that this patient will require hospital care that crosses at least 2 midnights to treat this condition based on the medical complexity of the problems presented.  Given the aforementioned information, the predictability of an adverse outcome is felt to be significant.  John Giovanni MD [facility]  If 7PM-7AM, please contact night-coverage [email or web address]  09/22/2023, 5:20 AM

## 2023-09-22 NOTE — Consult Note (Addendum)
 Cardiology Consultation   Patient ID: Karen Bennett MRN: [id number]; DOB: 01-Feb-1976  Admit date: 09/22/2023 Date of Consult: 09/22/2023  PCP:  [name], NP   [facility] Providers Cardiologist:  Chilton Si, MD        Patient Profile:   Karen Bennett is a 48 y.o. 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  History of Present Illness:   Ms. Karen Bennett with above PMH who presented to ER today for lower leg edema. She was recently hospitalized here until 08/21/23 for CHF exacerbation (see below summary). She states her leg had been swelling since she left the hospital. She also noted some dyspnea with exertion. She has been compliant with her medications since discharge.  Admission labs so far showed Na 130, K 5.9, Cl 96, BUN 73, Cr 3.7 (Cr was 2.21 on 08/21/23). BNP 572. CBC diff showed Hgb 11. CXR showed Congestive heart failure with layering bilateral pleural effusions. EKG with sinus rhythm 71bpm, RBBB, first degree AVB. She was admitted to hospitalist, she was given IV Lasix 40mg  x1, sodium bicarb 50 mEq, and lokelma. Cardiology is consulted for further evaluation today.    Per chart review, patient had 5 hospitalizations in [facility] system since 2024.      She was hospitalized for acute on chronic HFpEF with right pleural effusion 12/2022.  Echo from 01/12/2023 revealed LVEF 50 to 55%, no RWMA, grade 2 DD, RV pressure overload, PASP elevated at 62.9 mmHg, mild to moderate MR. BNP 241.  High sensitive troponin negative.  She was diuresed with IV Lasix and transition to 20 mg every other day.  She was  noted sinus bradycardia and home metoprolol was stopped at the time.     She was hospitalized 01/2023 for acute PE with no right heart strain due to noncompliance with Xarelto for 1 month.  She had known DVT/PE was on chronic Xarelto prior to that, unclear if this was provoked.     She was hospitalized again 02/2023 for leg pain and weakness, found to have central canal stenosis without compression on CT lumbar.  Felt fluid overloaded and given IV Lasix, this was complicated by developing AKI on CKD stage IIIa.     She was hospitalized 03/21/2023, found to have right inferior cerebellar ischemic stroke.  Echo bubble study from 03/22/2023 showed LVEF 45%, apical hypokinesis, mild MR, possible small intrapulmonary shunt.  TEE 03/23/2023 revealed LVEF 45 to 50%, RV pressure overload, mildly reduced RV systolic function, mild RV enlargement, mild LAE, no LA/LAA thrombus, mild RAE, mild MR, aortic sclerosis, bubble study was positive for interatrial shunt.  She was seen by neurology, stroke etiology was felt paradoxical embolic from PFO.  Her PTA Xarelto was switched to Eliquis for CVA prophylaxis and recent PE.  She was also found to have LDL of 238 and hemoglobin L2G 10.7%.  She is most recently hospitalized here from 08/01/23-08/21/23 for acute on chronic biventricular heart failure (RV predominant), ischemia and chronic thromboembolic pulmonary hypertension were difficult to rule out. Echo from 08/02/23 showed LVEF 35-40%, grade III DD, RV volume overload, severe akinesis of the left ventricular, mid inferior wall and inferolateral wall, low  normal RV, PASP 69.5 mmHg, mod LAE, severe RAE, mild MR, mod TR.  She was seen by AHF team.  She had required milrinone assisted diuresis, Net - 17,048 ml at time of discharge. She was released on GDMT with Torsemide 80 mg BID, Spironolactone 12.5 mg daily, felt not a good candidate for SGLT2I. She was started on mexiletine 250 mg BID for PVC suppression. Echo was planned in 3-4  month and if EF remains down cardiac cath was suggested for ischemic evaluation.    Furthermore, she has never followed a cardiologist outpatient in the past.  TEE was completed 12/2020 due to Candida albicans fungemia.  At the time LVEF was 60 to 65%, no regional motion abnormality, normal RV, no LA/LAA thrombus, mitral valve vegetation was seen, mild to moderate MR, mild to moderate TR, and trivial AI.  She had a subsequent echo completed 02/05/2021 revealing LVEF down to 50 to 55%, grade 2 DD and limited review of RV, no evidence of MR, moderate TR.    At the bedside patient is somnolent. But easily arousable and can communicated. She is on room air.  Past Medical History:  Diagnosis Date   Anxiety and depression 06/26/2013   Cellulitis    Chronic kidney disease, stage 3a (HCC) 01/11/2023   Diabetes mellitus    Hyperlipidemia associated with type 2 diabetes mellitus (HCC) 05/02/2013   Hypertension associated with diabetes (HCC) 12/27/2020   Hypothyroidism    Infective endocarditis    PE (pulmonary embolism) ~2007-2008   Not on anticoagulation    Past Surgical History:  Procedure Laterality Date   Abscess removal from L groin     APPLICATION OF WOUND VAC Left 05/01/2013   Procedure: APPLICATION OF WOUND VAC;  Surgeon: Kerrin Champagne, MD;  Location: [facility];  Service: Orthopedics;  Laterality: Left;   BELOW KNEE LEG AMPUTATION Right    CYSTOSCOPY W/ URETERAL STENT PLACEMENT Right 09/03/2021   Procedure: CYSTOSCOPY WITH RETROGRADE PYELOGRAM/URETERAL STENT PLACEMENT;  Surgeon: Marcine Matar, MD;  Location: [facility];  Service: Urology;  Laterality: Right;   I & D EXTREMITY  11/12/2011   Procedure: IRRIGATION AND DEBRIDEMENT EXTREMITY;  Surgeon: Kathryne Hitch, MD;  Location: [facility];  Service: Orthopedics;  Laterality: Left;  foot left   I & D EXTREMITY Left 09/06/2012   Procedure: IRRIGATION AND DEBRIDEMENT EXTREMITY;  Surgeon: Eldred Manges, MD;  Location: [facility];  Service:  Orthopedics;  Laterality: Left;   I & D EXTREMITY Left 05/01/2013   Procedure: INCISION AND DRAINAGE LEFT FOREFOOT ABCESS ;  Surgeon: Kerrin Champagne, MD;  Location: [facility];  Service: Orthopedics;  Laterality: Left;   INCISION AND DRAINAGE ABSCESS Left 12/27/2020   Procedure: INCISION AND DRAINAGE LEFT THIGH ABSCESS;  Surgeon: Berna Bue, MD;  Location: [facility];  Service: General;  Laterality: Left;   IR FLUORO GUIDE CV LINE RIGHT  09/12/2021   IR PTA VENOUS EXCEPT DIALYSIS CIRCUIT  01/04/2021   IR RADIOLOGIST EVAL & MGMT  10/25/2021   IR US GUIDE VASC ACCESS RIGHT  09/12/2021   IR US GUIDE VASC ACCESS RIGHT  08/10/2023   IR VENO/EXT/UNI RIGHT  01/04/2021   Surgery to remove hematoma in L leg     TEE WITHOUT CARDIOVERSION N/A 01/03/2021   Procedure: TRANSESOPHAGEAL ECHOCARDIOGRAM (TEE);  Surgeon: Quintella Reichert, MD;  Location[facility];  Service: Cardiovascular;  Laterality: N/A;   TEE WITHOUT CARDIOVERSION N/A 03/23/2023   Procedure: TRANSESOPHAGEAL ECHOCARDIOGRAM;  Surgeon: [name],  MD;  Location: [facility] INVASIVE CV [facility];  Service: Cardiovascular;  Laterality: N/A;   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     Home Medications:  Prior to Admission medications   Medication Sig Start Date End Date Taking? Authorizing Provider  apixaban (ELIQUIS) 5 MG TABS tablet Take 1 tablet (5 mg total) by mouth 2 (two) times daily. 03/23/23  Yes Rhetta Mura, MD  atorvastatin (LIPITOR) 40 MG tablet Take 40 mg by mouth every evening.   Yes [provider]  ezetimibe (ZETIA) 10 MG tablet Take 10 mg by mouth daily.   Yes [provider]  hydrOXYzine (VISTARIL) 50 MG capsule Take 50 mg by mouth 2 (two) times daily as needed for itching or anxiety.   Yes [provider]  insulin lispro (HUMALOG) 100 UNIT/ML injection Inject 1-6 Units into the skin See admin instructions. Inject 1-6 units into the skin three times a day with meals, PER SLIDING SCALE   Yes  [provider]  JARDIANCE 25 MG TABS tablet Take 25 mg by mouth daily. 09/04/23  Yes [provider]  LANTUS SOLOSTAR 100 UNIT/ML Solostar Pen Inject 10 Units into the skin at bedtime.   Yes [provider]  levothyroxine (SYNTHROID) 75 MCG tablet Take 75 mcg by mouth daily before breakfast.   Yes [provider]  methocarbamol (ROBAXIN) 750 MG tablet Take 750 mg by mouth every 8 (eight) hours as needed for muscle spasms. 07/09/23  Yes [provider]  mexiletine (MEXITIL) 250 MG capsule Take 1 capsule (250 mg total) by mouth every 12 (twelve) hours. 08/21/23  Yes Arrien, York Ram, MD  minoxidil (LONITEN) 10 MG tablet Take 5 mg by mouth daily. 09/15/23  Yes [provider]  oxyCODONE (ROXICODONE) 15 MG immediate release tablet Take 15 mg by mouth every 6 (six) hours as needed. 09/18/23 09/28/23 Yes [provider]  potassium chloride (KLOR-CON) 10 MEQ tablet Take 10 mEq by mouth 2 (two) times daily.   Yes [provider]  spironolactone (ALDACTONE) 25 MG tablet Take 12.5 mg by mouth daily.   Yes [provider]  torsemide (DEMADEX) 20 MG tablet Take 80 mg by mouth daily.   Yes [provider]  traZODone (DESYREL) 100 MG tablet Take 100 mg by mouth at bedtime.   Yes [provider]  venlafaxine XR (EFFEXOR-XR) 75 MG 24 hr capsule Take 75 mg by mouth daily with breakfast.   Yes [provider]  atorvastatin (LIPITOR) 80 MG tablet Take 1 tablet (80 mg total) by mouth daily at 6 PM. 08/21/23 09/20/23  Arrien, York Ram, MD  Continuous Glucose Sensor (FREESTYLE LIBRE 3 SENSOR) MISC Place 1 sensor on the skin every 14 days. Use to check glucose continuously 03/23/23   Rhetta Mura, MD  ezetimibe (ZETIA) 10 MG tablet Take 1 tablet (10 mg total) by mouth daily. 08/22/23 09/21/23  Arrien, York Ram, MD  glucose blood test strip Use as instructed 06/26/13   Doris Cheadle, MD  glucose  monitoring kit (FREESTYLE) monitoring kit 1 each by Does not apply route as needed for other. 06/26/13   Doris Cheadle, MD  MOUNJARO 2.5 MG/0.5ML Pen Inject 2.5 mg into the skin every Wednesday.   Yes [provider]  promethazine (PHENERGAN) 25 MG tablet Take 25 mg by mouth every 6 (six) hours as needed for nausea or vomiting.   Yes [provider]  spironolactone (ALDACTONE) 25 MG tablet Take 0.5 tablets (12.5 mg  total) by mouth daily. 08/22/23 09/21/23  Arrien, York Ram, MD  torsemide (DEMADEX) 20 MG tablet Take 4 tablets (80 mg total) by mouth 2 (two) times daily. 08/21/23 09/20/23  Arrien, York Ram, MD    Inpatient Medications: Scheduled Meds:  apixaban  5 mg Oral BID   atorvastatin  40 mg Oral QPM   ezetimibe  10 mg Oral Daily   insulin aspart  0-15 Units Subcutaneous TID WC   insulin aspart  0-5 Units Subcutaneous QHS   insulin glargine-yfgn  10 Units Subcutaneous QHS   [START ON 09/23/2023] levothyroxine  75 mcg Oral QAC breakfast   Continuous Infusions:  PRN Meds: acetaminophen **OR** acetaminophen, oxyCODONE  Allergies:    Allergies  Allergen Reactions   Clindamycin Diarrhea and Nausea And Vomiting   Zofran Itching and Nausea And Vomiting   Doxycycline Diarrhea and Nausea And Vomiting   Morphine And Codeine Hives, Itching and Rash    Social History:   Social History   Socioeconomic History   Marital status: Single    Spouse name: Not on file   Number of children: Not on file   Years of education: Not on file   Highest education level: Not on file  Occupational History   Not on file  Tobacco Use   Smoking status: Some Days    Types: Cigarettes    Passive exposure: Never   Smokeless tobacco: Never  Vaping Use   Vaping status: Never Used  Substance and Sexual Activity   Alcohol use: No   Drug use: No   Sexual activity: Never  Other Topics Concern   Not on file  Social History Narrative   Lives at home on her own and is  ambulatory, is single and has no children, no family who checks up on her or helps her, and only one friend who helps with daily activities when she can. She is a never smoker.    Social Drivers of Corporate investment banker Strain: Low Risk  (09/07/2023)   Received from [facility]   Overall Financial Resource Strain (CARDIA)    Difficulty of Paying Living Expenses: Not hard at all  Food Insecurity: No Food Insecurity (09/07/2023)   Received from [facility]   Hunger Vital Sign    Worried About Running Out of Food in the Last Year: Never true    Ran Out of Food in the Last Year: Never true  Transportation Needs: No Transportation Needs (09/07/2023)   Received from [facility] - Transportation    Lack of Transportation (Medical): No    Lack of Transportation (Non-Medical): No  Physical Activity: Sufficiently Active (05/07/2023)   Received from [facility]   Exercise Vital Sign    Days of Exercise per Week: 4 days    Minutes of Exercise per Session: 60 min  Stress: Stress Concern Present (05/07/2023)   Received from [facility] - Occupational Stress Questionnaire    Feeling of Stress : To some extent  Social Connections: Moderately Integrated (05/07/2023)   Received from [facility]   Social Network    How would you rate your social network (family, work, friends)?: Adequate participation with social networks  Intimate Partner Violence: Not At Risk (08/01/2023)   Humiliation, Afraid, Rape, and Kick questionnaire    Fear of Current or Ex-Partner: No    Emotionally Abused: No    Physically Abused: No    Sexually Abused: No  Family History:    Family History  Problem Relation Age of Onset   Diabetes Mother    Coronary artery disease Mother    Stroke Mother      ROS:  Please see the history of present illness.   All other ROS reviewed and negative.     Physical Exam/Data:   Vitals:   09/22/23 0400  09/22/23 0431 09/22/23 0500 09/22/23 0530  BP: (!) 159/94  (!) 166/101 (!) 166/102  Bennett:   72   Resp: 17  15 15   Temp:  98.1 F (36.7 C)    TempSrc:  Oral    SpO2:   97% 98%  Weight:      Height:       No intake or output data in the 24 hours ending 09/22/23 0718    09/22/2023    2:09 AM 08/21/2023    4:32 AM 08/20/2023    5:06 AM  Last 3 Weights  Weight (lbs) 360 lb 370 lb 370 lb 13 oz  Weight (kg) 163.295 kg 167.831 kg 168.2 kg     Body mass index is 58.11 kg/m.  General:  Well nourished, well developed, in no acute distress, morbidly obese HEENT: normal Neck: JVD challenging with habitus Vascular: No carotid bruits; Distal pulses 2+ bilaterally Cardiac:  normal S1, S2; RRR; no murmur  Lungs:  clear to auscultation bilaterally, no wheezing, rhonchi or rales  Abd: soft, nontender, no hepatomegaly  Ext: R BKA, L LE edema Musculoskeletal:  No deformities, BUE and BLE strength normal and equal Skin: warm and dry  Neuro:  CNs 2-12 intact, no focal abnormalities noted Psych:  Normal affect   EKG:  The EKG was personally reviewed and demonstrates:  sinus rhythm, prolonged PR, RBBB  Telemetry:  Telemetry was personally reviewed and demonstrates:  sinus  Relevant CV Studies:  Echo from 08/02/23:  1. No apical thrombus with Definity. Left ventricular ejection fraction,  by estimation, is 35 to 40%. Left ventricular ejection fraction by 2D MOD  biplane is 40.8 %. The left ventricle has moderately decreased function.  The left ventricle demonstrates  regional wall motion abnormalities (see scoring diagram/findings for  description). The left ventricular internal cavity size was mildly  dilated. Left ventricular diastolic parameters are consistent with Grade  III diastolic dysfunction (restrictive).  Elevated left ventricular end-diastolic pressure. There is abnormal  (paradoxical) septal motion, consistent with right ventricular volume  overload. There is severe akinesis of  the left ventricular, mid inferior  wall and inferolateral wall.   2. Right ventricular systolic function is low normal. The right  ventricular size is normal. There is severely elevated pulmonary artery  systolic pressure. The estimated right ventricular systolic pressure is  69.5 mmHg.   3. Left atrial size was moderately dilated.   4. Right atrial size was severely dilated.   5. The mitral valve is abnormal. Mild mitral valve regurgitation.   6. The tricuspid valve is abnormal. Tricuspid valve regurgitation is  moderate.   7. The aortic valve is tricuspid. Aortic valve regurgitation is not  visualized. No aortic stenosis is present.   8. The inferior vena cava is dilated in size with <50% respiratory  variability, suggesting right atrial pressure of 15 mmHg.   Comparison(s): Changes from prior study are noted. 03/23/2023: LVEF 45%  with apical hypokinesis.   Conclusion(s)/Recommendation(s): Compared to the prior study, there appear  to be new regional WMA's - suggestive of LCX territory ischemia/infarct.    TEE 03/23/23:  1. Left ventricular ejection fraction, by estimation, is 45 to 50%. The  left ventricle has mildly decreased function. There is the  interventricular septum is flattened in systole, consistent with right  ventricular pressure overload.   2. Right ventricular systolic function is mildly reduced. The right  ventricular size is mildly enlarged.   3. Left atrial size was mildly dilated. No left atrial/left atrial  appendage thrombus was detected.   4. Right atrial size was mildly dilated.   5. The mitral valve is normal in structure. Mild mitral valve  regurgitation.   6. The aortic valve is tricuspid. Aortic valve regurgitation is trivial.  Aortic valve sclerosis/calcification is present, without any evidence of  aortic stenosis.   7. Agitated saline contrast bubble study was positive with shunting  observed within 3-6 cardiac cycles suggestive of interatrial  shunt.    Laboratory Data:  High Sensitivity Troponin:  No results for input(s): "TROPONINIHS" in the last 720 hours.   Chemistry Recent Labs  Lab 09/22/23 0220  NA 130*  K 5.9*  CL 96*  GLUCOSE 135*  BUN 73*  CREATININE 3.70*    No results for input(s): "PROT", "ALBUMIN", "AST", "ALT", "ALKPHOS", "BILITOT" in the last 168 hours. Lipids No results for input(s): "CHOL", "TRIG", "HDL", "LABVLDL", "LDLCALC", "CHOLHDL" in the last 168 hours.  Hematology Recent Labs  Lab 09/22/23 0215 09/22/23 0220  WBC 5.6  --   RBC 4.10  --   HGB 11.0* 12.2  HCT 35.9* 36.0  MCV 87.6  --   MCH 26.8  --   MCHC 30.6  --   RDW 14.4  --   PLT 248  --    Thyroid No results for input(s): "TSH", "FREET4" in the last 168 hours.  BNP Recent Labs  Lab 09/22/23 0215  BNP 572.3*    DDimer No results for input(s): "DDIMER" in the last 168 hours.   Radiology/Studies:  DG Chest Portable 1 View Result Date: 09/22/2023 CLINICAL DATA:  Bilateral lower extremity edema. Shortness of breath EXAM: PORTABLE CHEST 1 VIEW COMPARISON:  08/01/2023 FINDINGS: Stable cardiomegaly. Diffuse interstitial coarsening and pulmonary vascular congestion. Hazy opacities over the lower lungs likely due to layering pleural effusions. No pneumothorax. No displaced rib fractures. IMPRESSION: Congestive heart failure with layering bilateral pleural effusions. Electronically Signed   By: Minerva Fester M.D.   On: 09/22/2023 03:10     Assessment and Plan:   Acute on chronic biventricular heart failure  Moderate reduced EF ?NICM, RV failure with group II,III PHTN [hx of PE, obesity hypoventilation] Presented with DOE, LE edema since discharge 08/21/23; BNP 572; CXR with CHF - Cr up to 3.7 from 2 range  - lactic normal.  need PICC line or CVL for COOX (ordered picc line) . If Co-ox low recommend starting milrinone 0.125 and plan to transfer to [facility] - on PTA Torsemide 80mg  BID, s/p IV Lasix 40mg  x1 so far, no UOP recorded so far,  clinically difficult to examine with significant obesity, recommend lasix 40 mg IV BID - GDMT: Hold spironolactone/entresto given AKI on CKD stage III, SGLT2 suggested to be held with morbid obesity and risk of yeast infection and candida mitral valve endocarditis/chronic wound leading to BKA on prior admission. Required milrinone prior, no BB.  Hx of PVCs -on mexiletine  Hx of CVA-right inferior cerebellar August 2024 possibly paradoxical embolization via PFO; continue apixaban. On lipitor and zetia.  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

## 2023-09-22 NOTE — Progress Notes (Signed)
 Secure chat sent to Dr Judie Petit. Wyline Mood and Dr Elvera Lennox that IR places her PICC lines due to subclavian stenosis. Will cancel PICC order for VAS Team.

## 2023-09-22 NOTE — ED Notes (Signed)
 Pt's dexcom stated CBG of 129. Refused CBG stick.

## 2023-09-22 NOTE — Progress Notes (Signed)
 No charge note  Patient seen and examined this morning, admitted overnight, H&P reviewed and I agree with the assessment and plan  In brief, this is a 48 year old female with history of morbid obesity, chronic systolic CHF, HTN, HLD, IDDM, hypothyroidism, prior DVT/PE on Eliquis, history of Candida mitral valve endocarditis in 2022, pulmonary hypertension, chronic "pulmonary, CKD 3B, right BKA, prior CVA, who comes into the hospital with worsening abdominal distention, lower extremity swelling and shortness of breath.  She was recently hospitalized and discharged in January 2025 for acute on chronic systolic CHF, required significant diuresis with midodrine augmentation and seen by advanced heart failure service.  Since discharge she tells me she has been doing well, compliant with her home medications as well as a low-sodium diet, but over the last several days-a week she has noticed increased swelling, decreased urination, and worsening shortness of breath.  She is unsure whether she gained any weight that she cannot weigh herself.  In the ED she was found to have acute kidney injury, hyperkalemia, and imaging and exam was consistent with fluid overload.  She was given Lasix and was admitted to the hospital  Patient will be admitted to the hospital for acute on chronic systolic CHF in the setting of underlying pulmonary hypertension, as well as acute kidney injury on chronic kidney disease stage IIIb.  Previous creatinine upon discharge was 2.2 in January, now increased to 3.7.  She will be given Lasix, cardiology was consulted this morning, appreciate input and also determination whether she should stay at [facility] long versus being transferred to [facility] for advanced heart failure consultation.  Will be placed on insulin, and lipid-lowering agents as per home regimen.  Continue Synthroid as well for hypothyroidism as well as Eliquis for history of DVT/PE.  She has morbid obesity and will benefit from  long-term weight loss  Scheduled Meds:  apixaban  5 mg Oral BID   atorvastatin  40 mg Oral QPM   ezetimibe  10 mg Oral Daily   furosemide  40 mg Intravenous BID   insulin aspart  0-15 Units Subcutaneous TID WC   insulin aspart  0-5 Units Subcutaneous QHS   insulin glargine-yfgn  10 Units Subcutaneous QHS   [START ON 09/23/2023] levothyroxine  75 mcg Oral QAC breakfast   Continuous Infusions: PRN Meds:.acetaminophen **OR** acetaminophen, oxyCODONE   Jala Dundon M. Elvera Lennox, MD, PhD [facility]  Between 7 am - 7 pm you can contact me via Amion (for emergencies) or Securechat (non urgent matters).  I am not available 7 pm - 7 am, please contact night coverage MD/APP via Amion

## 2023-09-22 NOTE — ED Notes (Signed)
 Assumed care of patient. Patient resting comfortably in bed with no signs of acute distress noted. Waiting on ready hospital bed.

## 2023-09-22 NOTE — ED Provider Notes (Signed)
 [place] EMERGENCY DEPARTMENT AT [facility] Provider Note   CSN: [id number] Arrival date & time: 09/22/23  7829     History  Chief Complaint  Patient presents with   Leg Swelling    Karen Bennett is a 48 y.o. female.  The history is provided by the patient.  Illness Location:  LLE Quality:  Edema Severity:  Moderate Onset quality:  Gradual Duration: weeks worsening in a day. Timing:  Constant Progression:  Worsening Chronicity:  Recurrent Context:  H/o CHF Relieved by:  Nothing Worsened by:  Nothing Ineffective treatments:  Torsemide Associated symptoms: cough   Associated symptoms: no chest pain and no fever   Patient with CKD and CHF presents with cough and leg swelling.      Past Medical History:  Diagnosis Date   Anxiety and depression 06/26/2013   Cellulitis    Chronic kidney disease, stage 3a (HCC) 01/11/2023   Diabetes mellitus    Hyperlipidemia associated with type 2 diabetes mellitus (HCC) 05/02/2013   Hypertension associated with diabetes (HCC) 12/27/2020   Hypothyroidism    Infective endocarditis    PE (pulmonary embolism) ~2007-2008   Not on anticoagulation     Home Medications Prior to Admission medications   Medication Sig Start Date End Date Taking? Authorizing Provider  apixaban (ELIQUIS) 5 MG TABS tablet Take 1 tablet (5 mg total) by mouth 2 (two) times daily. 03/23/23   Rhetta Mura, MD  atorvastatin (LIPITOR) 80 MG tablet Take 1 tablet (80 mg total) by mouth daily at 6 PM. 08/21/23 09/20/23  Arrien, York Ram, MD  Continuous Glucose Sensor (FREESTYLE LIBRE 3 SENSOR) MISC Place 1 sensor on the skin every 14 days. Use to check glucose continuously 03/23/23   Rhetta Mura, MD  ezetimibe (ZETIA) 10 MG tablet Take 1 tablet (10 mg total) by mouth daily. 08/22/23 09/21/23  Arrien, York Ram, MD  glucose blood test strip Use as instructed 06/26/13   Doris Cheadle, MD  glucose monitoring kit (FREESTYLE)  monitoring kit 1 each by Does not apply route as needed for other. 06/26/13   Doris Cheadle, MD  hydrOXYzine (VISTARIL) 50 MG capsule Take 50 mg by mouth 2 (two) times daily as needed for itching or anxiety.    [provider]  insulin lispro (HUMALOG) 100 UNIT/ML injection Inject 1-6 Units into the skin See admin instructions. Inject 1-6 units into the skin three times a day with meals, PER SLIDING SCALE    [provider]  LANTUS SOLOSTAR 100 UNIT/ML Solostar Pen Inject 10 Units into the skin at bedtime.    [provider]  levothyroxine (SYNTHROID) 75 MCG tablet Take 75 mcg by mouth daily before breakfast.    [provider]  methocarbamol (ROBAXIN) 750 MG tablet Take 750 mg by mouth every 8 (eight) hours as needed for muscle spasms. 07/09/23   [provider]  mexiletine (MEXITIL) 250 MG capsule Take 1 capsule (250 mg total) by mouth every 12 (twelve) hours. 08/21/23   Arrien, York Ram, MD  MOUNJARO 2.5 MG/0.5ML Pen Inject 2.5 mg into the skin every Wednesday.    [provider]  promethazine (PHENERGAN) 25 MG tablet Take 25 mg by mouth every 6 (six) hours as needed for nausea or vomiting.    [provider]  spironolactone (ALDACTONE) 25 MG tablet Take 0.5 tablets (12.5 mg total) by mouth daily. 08/22/23 09/21/23  Arrien, York Ram, MD  torsemide (DEMADEX) 20 MG tablet Take 4 tablets (80 mg total) by  mouth 2 (two) times daily. 08/21/23 09/20/23  Arrien, York Ram, MD  traZODone (DESYREL) 50 MG tablet Take 50 mg by mouth at bedtime.    [provider]  triamcinolone cream (KENALOG) 0.5 % Apply 1 Application topically 3 (three) times daily as needed (for itching or irritation- affected areas).    [provider]  TYLENOL 500 MG tablet Take 500-1,000 mg by mouth every 6 (six) hours as needed for mild pain (pain score 1-3) or headache.    [provider]  venlafaxine XR (EFFEXOR-XR) 37.5 MG 24 hr  capsule Take 37.5 mg by mouth daily. 06/27/23   [provider]      Allergies    Clindamycin, Zofran, Doxycycline, and Morphine and codeine    Review of Systems   Review of Systems  Constitutional:  Negative for fever.  HENT:  Negative for facial swelling.   Respiratory:  Positive for cough.   Cardiovascular:  Positive for leg swelling. Negative for chest pain.  All other systems reviewed and are negative.   Physical Exam Updated Vital Signs BP 125/84 (BP Location: Right Arm)   Pulse 80   Temp 97.9 F (36.6 C) (Oral)   Resp 17   Ht 5\' 6"  (1.676 m)   Wt (!) 163.3 kg   LMP 04/13/2013   SpO2 97%   BMI 58.11 kg/m  Physical Exam Vitals and nursing note reviewed.  Constitutional:      General: She is not in acute distress.    Appearance: She is well-developed.  HENT:     Head: Normocephalic and atraumatic.     Nose: Nose normal.  Eyes:     Pupils: Pupils are equal, round, and reactive to light.  Cardiovascular:     Rate and Rhythm: Normal rate and regular rhythm.     Pulses: Normal pulses.     Heart sounds: Normal heart sounds.  Pulmonary:     Effort: Pulmonary effort is normal. No respiratory distress.     Breath sounds: Decreased breath sounds present.  Abdominal:     General: Bowel sounds are normal. There is no distension.     Palpations: Abdomen is soft.     Tenderness: There is no abdominal tenderness. There is no guarding or rebound.  Musculoskeletal:        General: Normal range of motion.     Cervical back: Neck supple.     Left lower leg: Edema present.  Skin:    General: Skin is dry.     Capillary Refill: Capillary refill takes less than 2 seconds.     Findings: No erythema or rash.  Neurological:     General: No focal deficit present.     Deep Tendon Reflexes: Reflexes normal.  Psychiatric:        Mood and Affect: Mood normal.     ED Results / Procedures / Treatments   Labs (all labs ordered are listed, but only abnormal results are  displayed) Results for orders placed or performed during the hospital encounter of 09/22/23  CBC with Differential   Collection Time: 09/22/23  2:15 AM  Result Value Ref Range   WBC 5.6 4.0 - 10.5 K/uL   RBC 4.10 3.87 - 5.11 MIL/uL   Hemoglobin 11.0 (L) 12.0 - 15.0 g/dL   HCT 40.9 (L) 81.1 - 91.4 %   MCV 87.6 80.0 - 100.0 fL   MCH 26.8 26.0 - 34.0 pg   MCHC 30.6 30.0 - 36.0 g/dL   RDW 78.2 95.6 -  15.5 %   Platelets 248 150 - 400 K/uL   nRBC 0.0 0.0 - 0.2 %   Neutrophils Relative % 65 %   Neutro Abs 3.7 1.7 - 7.7 K/uL   Lymphocytes Relative 23 %   Lymphs Abs 1.3 0.7 - 4.0 K/uL   Monocytes Relative 9 %   Monocytes Absolute 0.5 0.1 - 1.0 K/uL   Eosinophils Relative 2 %   Eosinophils Absolute 0.1 0.0 - 0.5 K/uL   Basophils Relative 1 %   Basophils Absolute 0.0 0.0 - 0.1 K/uL   Immature Granulocytes 0 %   Abs Immature Granulocytes 0.02 0.00 - 0.07 K/uL  Brain natriuretic peptide   Collection Time: 09/22/23  2:15 AM  Result Value Ref Range   B Natriuretic Peptide 572.3 (H) 0.0 - 100.0 pg/mL  I-stat chem 8, ED (not at [facility], [facility] or [facility])   Collection Time: 09/22/23  2:20 AM  Result Value Ref Range   Sodium 130 (L) 135 - 145 mmol/L   Potassium 5.9 (H) 3.5 - 5.1 mmol/L   Chloride 96 (L) 98 - 111 mmol/L   BUN 73 (H) 6 - 20 mg/dL   Creatinine, Ser 0.98 (H) 0.44 - 1.00 mg/dL   Glucose, Bld 119 (H) 70 - 99 mg/dL   Calcium, Ion 1.47 (L) 1.15 - 1.40 mmol/L   TCO2 29 22 - 32 mmol/L   Hemoglobin 12.2 12.0 - 15.0 g/dL   HCT 82.9 56.2 - 13.0 %   DG Chest Portable 1 View Result Date: 09/22/2023 CLINICAL DATA:  Bilateral lower extremity edema. Shortness of breath EXAM: PORTABLE CHEST 1 VIEW COMPARISON:  08/01/2023 FINDINGS: Stable cardiomegaly. Diffuse interstitial coarsening and pulmonary vascular congestion. Hazy opacities over the lower lungs likely due to layering pleural effusions. No pneumothorax. No displaced rib fractures. IMPRESSION: Congestive heart failure with layering bilateral  pleural effusions. Electronically Signed   By: Minerva Fester M.D.   On: 09/22/2023 03:10    EKG  EKG Interpretation Date/Time:  Saturday September 22 2023 03:04:39 EST Ventricular Rate:  71 PR Interval:  217 QRS Duration:  187 QT Interval:  492 QTC Calculation: 535 R Axis:   -63  Text Interpretation: Sinus rhythm Prolonged PR interval Right bundle branch block Long QT Confirmed by Ajah Vanhoose ([id number]) on 09/22/2023 3:40:47 AM         Radiology No results found.  Procedures .Critical Care  Performed by: Cy Blamer, MD Authorized by: Cy Blamer, MD   Critical care provider statement:    Critical care time (minutes):  30   Critical care end time:  09/22/2023 4:21 AM   Critical care was necessary to treat or prevent imminent or life-threatening deterioration of the following conditions:  Cardiac failure and renal failure   Critical care was time spent personally by me on the following activities:  Development of treatment plan with patient or surrogate, discussions with consultants, evaluation of patient's response to treatment, examination of patient, ordering and review of laboratory studies, ordering and review of radiographic studies, ordering and performing treatments and interventions, pulse oximetry, re-evaluation of patient's condition and review of old charts     Medications Ordered in ED Medications  sodium zirconium cyclosilicate (LOKELMA) packet 10 g (10 g Oral Given 09/22/23 0308)  ipratropium-albuterol (DUONEB) 0.5-2.5 (3) MG/3ML nebulizer solution 3 mL (3 mLs Nebulization Given 09/22/23 0307)  sodium bicarbonate injection 50 mEq (50 mEq Intravenous Given 09/22/23 0305)  acetaminophen (TYLENOL) tablet 1,000 mg (1,000 mg Oral Given 09/22/23 0412)  furosemide (LASIX)  injection 40 mg (40 mg Intravenous Given 09/22/23 1610)     ED Course/ Medical Decision Making/ A&P                                 Medical Decision Making Patient with LLE  Amount and/or Complexity  of Data Reviewed Independent Historian: EMS    Details: See above  Labs: ordered.    Details: Low sodium 130, high potassium 5.9, elevated creatinine 3.7, normal white count 5.6, low hemoglobin 11, normal platelets  Radiology: ordered and independent interpretation performed.    Details: Chf  ECG/medicine tests: ordered and independent interpretation performed.  Risk OTC drugs. Prescription drug management. Decision regarding hospitalization.    Final Clinical Impression(s) / ED Diagnoses Final diagnoses:  Peripheral edema  AKI (acute kidney injury) (HCC)  Hyperkalemia   The patient appears reasonably stabilized for admission considering the current resources, flow, and capabilities available in the ED at this time, and I doubt any other [facility] requiring further screening and/or treatment in the ED prior to admission.  Rx / DC Orders ED Discharge Orders     None         Lariza Cothron, MD 09/22/23 9604

## 2023-09-23 DIAGNOSIS — I5023 Acute on chronic systolic (congestive) heart failure: Secondary | ICD-10-CM | POA: Diagnosis not present

## 2023-09-23 LAB — COMPREHENSIVE METABOLIC PANEL
ALT: 8 U/L (ref 0–44)
AST: 11 U/L — ABNORMAL LOW (ref 15–41)
Albumin: 2.8 g/dL — ABNORMAL LOW (ref 3.5–5.0)
Alkaline Phosphatase: 67 U/L (ref 38–126)
Anion gap: 10 (ref 5–15)
BUN: 79 mg/dL — ABNORMAL HIGH (ref 6–20)
CO2: 25 mmol/L (ref 22–32)
Calcium: 8.7 mg/dL — ABNORMAL LOW (ref 8.9–10.3)
Chloride: 96 mmol/L — ABNORMAL LOW (ref 98–111)
Creatinine, Ser: 3.23 mg/dL — ABNORMAL HIGH (ref 0.44–1.00)
GFR, Estimated: 17 mL/min — ABNORMAL LOW (ref >60)
Glucose, Bld: 84 mg/dL (ref 70–99)
Potassium: 5.2 mmol/L — ABNORMAL HIGH (ref 3.5–5.1)
Sodium: 131 mmol/L — ABNORMAL LOW (ref 135–145)
Total Bilirubin: 0.9 mg/dL (ref 0.0–1.2)
Total Protein: 7.3 g/dL (ref 6.5–8.1)

## 2023-09-23 LAB — CBC
HCT: 38.1 % (ref 36.0–46.0)
Hemoglobin: 11.4 g/dL — ABNORMAL LOW (ref 12.0–15.0)
MCH: 26.8 pg (ref 26.0–34.0)
MCHC: 29.9 g/dL — ABNORMAL LOW (ref 30.0–36.0)
MCV: 89.6 fL (ref 80.0–100.0)
Platelets: 225 10*3/uL (ref 150–400)
RBC: 4.25 MIL/uL (ref 3.87–5.11)
RDW: 14.5 % (ref 11.5–15.5)
WBC: 4.4 10*3/uL (ref 4.0–10.5)
nRBC: 0 % (ref 0.0–0.2)

## 2023-09-23 LAB — GLUCOSE, CAPILLARY: Glucose-Capillary: 89 mg/dL (ref 70–99)

## 2023-09-23 LAB — MAGNESIUM: Magnesium: 2.4 mg/dL (ref 1.7–2.4)

## 2023-09-23 LAB — PHOSPHORUS: Phosphorus: 7.6 mg/dL — ABNORMAL HIGH (ref 2.5–4.6)

## 2023-09-23 LAB — MRSA NEXT GEN BY PCR, NASAL: MRSA by PCR Next Gen: DETECTED — AB

## 2023-09-23 MED ORDER — CHLORHEXIDINE GLUCONATE CLOTH 2 % EX PADS
6.0000 | MEDICATED_PAD | Freq: Every day | CUTANEOUS | Status: AC
  Administered 2023-09-23 – 2023-09-25 (×2): 6 via TOPICAL

## 2023-09-23 MED ORDER — MUPIROCIN 2 % EX OINT
1.0000 | TOPICAL_OINTMENT | Freq: Two times a day (BID) | CUTANEOUS | Status: AC
  Administered 2023-09-23 – 2023-09-28 (×10): 1 via NASAL
  Filled 2023-09-23 (×3): qty 22

## 2023-09-23 NOTE — Plan of Care (Signed)

## 2023-09-23 NOTE — Progress Notes (Signed)
 Rounding Note    Patient Name: Karen Bennett Date of Encounter: 09/23/2023  [facility] Cardiologist: Chilton Si, MD   Subjective   She is lying in bed not short of breath  Inpatient Medications    Scheduled Meds:  apixaban  5 mg Oral BID   atorvastatin  40 mg Oral QPM   Chlorhexidine Gluconate Cloth  6 each Topical Daily   ezetimibe  10 mg Oral Daily   furosemide  40 mg Intravenous BID   insulin aspart  0-15 Units Subcutaneous TID WC   insulin aspart  0-5 Units Subcutaneous QHS   insulin glargine-yfgn  10 Units Subcutaneous QHS   levothyroxine  75 mcg Oral QAC breakfast   mexiletine  250 mg Oral Q12H   mupirocin ointment  1 Application Nasal BID   Continuous Infusions:  PRN Meds: acetaminophen **OR** acetaminophen, oxyCODONE   Vital Signs    Vitals:   09/22/23 1330 09/22/23 1419 09/22/23 2027 09/23/23 0544  BP: (!) 171/83 (!) 100/53 (!) 100/53 105/62  Pulse: (!) 57 (!) 57 73 (!) 57  Resp: 17 20    Temp:  98.7 F (37.1 C) (!) 97.4 F (36.3 C) (!) 97.5 F (36.4 C)  TempSrc:  Oral Oral Oral  SpO2: 98% 98% 93% 94%  Weight:  (!) 202 kg    Height:        Intake/Output Summary (Last 24 hours) at 09/23/2023 1045 Last data filed at 09/22/2023 2200 Gross per 24 hour  Intake 240 ml  Output --  Net 240 ml      09/22/2023    2:19 PM 09/22/2023    2:09 AM 08/21/2023    4:32 AM  Last 3 Weights  Weight (lbs) 445 lb 5.3 oz 360 lb 370 lb  Weight (kg) 202 kg 163.295 kg 167.831 kg      Telemetry    Sinus rhythm- Personally Reviewed  ECG    No new- Personally Reviewed  Physical Exam   Vitals:   09/22/23 2027 09/23/23 0544  BP: (!) 100/53 105/62  Pulse: 73 (!) 57  Resp:    Temp: (!) 97.4 F (36.3 C) (!) 97.5 F (36.4 C)  SpO2: 93% 94%    GEN: No acute distress.  Morbidly obese Neck: Difficult to assess JVD Cardiac: RRR, no murmurs, rubs, or gallops.  Respiratory: Clear to auscultation bilaterally. GI: Soft, nontender, non-distended   MS: Bilateral lower extremity edema Neuro:  Nonfocal  Psych: Normal affect   Labs    High Sensitivity Troponin:  No results for input(s): "TROPONINIHS" in the last 720 hours.   Chemistry Recent Labs  Lab 09/22/23 0220 09/22/23 0656 09/23/23 0534  NA 130* 131* 131*  K 5.9* 5.0 5.2*  CL 96* 94* 96*  CO2  --  28 25  GLUCOSE 135* 133* 84  BUN 73* 73* 79*  CREATININE 3.70* 3.29* 3.23*  CALCIUM  --  8.7* 8.7*  MG  --  2.2 2.4  PROT  --  7.4 7.3  ALBUMIN  --  2.9* 2.8*  AST  --  9* 11*  ALT  --  9 8  ALKPHOS  --  63 67  BILITOT  --  0.9 0.9  GFRNONAA  --  17* 17*  ANIONGAP  --  9 10    Lipids No results for input(s): "CHOL", "TRIG", "HDL", "LABVLDL", "LDLCALC", "CHOLHDL" in the last 168 hours.  Hematology Recent Labs  Lab 09/22/23 0215 09/22/23 0220 09/23/23 0534  WBC 5.6  --  4.4  RBC 4.10  --  4.25  HGB 11.0* 12.2 11.4*  HCT 35.9* 36.0 38.1  MCV 87.6  --  89.6  MCH 26.8  --  26.8  MCHC 30.6  --  29.9*  RDW 14.4  --  14.5  PLT 248  --  225   Thyroid  Recent Labs  Lab 09/22/23 1607  TSH 4.243    BNP Recent Labs  Lab 09/22/23 0215  BNP 572.3*    DDimer No results for input(s): "DDIMER" in the last 168 hours.   Radiology    [place] EKG SITE RITE Result Date: 09/22/2023 If Site Rite image not attached, placement could not be confirmed due to current cardiac rhythm.  DG Chest Portable 1 View Result Date: 09/22/2023 CLINICAL DATA:  Bilateral lower extremity edema. Shortness of breath EXAM: PORTABLE CHEST 1 VIEW COMPARISON:  08/01/2023 FINDINGS: Stable cardiomegaly. Diffuse interstitial coarsening and pulmonary vascular congestion. Hazy opacities over the lower lungs likely due to layering pleural effusions. No pneumothorax. No displaced rib fractures. IMPRESSION: Congestive heart failure with layering bilateral pleural effusions. Electronically Signed   By: Minerva Fester M.D.   On: 09/22/2023 03:10    Cardiac Studies  TTE 08/02/2023 1. No apical thrombus with  Definity. Left ventricular ejection fraction,  by estimation, is 35 to 40%. Left ventricular ejection fraction by 2D MOD  biplane is 40.8 %. The left ventricle has moderately decreased function.  The left ventricle demonstrates  regional wall motion abnormalities (see scoring diagram/findings for  description). The left ventricular internal cavity size was mildly  dilated. Left ventricular diastolic parameters are consistent with Grade  III diastolic dysfunction (restrictive).  Elevated left ventricular end-diastolic pressure. There is abnormal  (paradoxical) septal motion, consistent with right ventricular volume  overload. There is severe akinesis of the left ventricular, mid inferior  wall and inferolateral wall.   2. Right ventricular systolic function is low normal. The right  ventricular size is normal. There is severely elevated pulmonary artery  systolic pressure. The estimated right ventricular systolic pressure is  69.5 mmHg.   3. Left atrial size was moderately dilated.   4. Right atrial size was severely dilated.   5. The mitral valve is abnormal. Mild mitral valve regurgitation.   6. The tricuspid valve is abnormal. Tricuspid valve regurgitation is  moderate.   7. The aortic valve is tricuspid. Aortic valve regurgitation is not  visualized. No aortic stenosis is present.   8. The inferior vena cava is dilated in size with <50% respiratory  variability, suggesting right atrial pressure of 15 mmHg.   Patient Profile     Karen Bennett is a 48 y.o. 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   Assessment & Plan     Acute on chronic biventricular heart failure  Moderate reduced EF ?NICM, RV failure with group II,III PHTN [hx of PE, obesity hypoventilation] Presented with DOE, LE edema since discharge 08/21/23; BNP 572; CXR with CHF -Her body habitus makes it difficult to assess her clinically.  Will continue IV Lasix - Cr up to 3.7 from 2 range; showing mild improvement - lactic normal.  need PICC line or CVL  for COOX (will need to be placed by IR ) .  So far no signs of shock.  But would like to get a Co-Ox - on PTA Torsemide 80mg  BID - GDMT: Hold spironolactone/entresto given AKI on CKD stage III, SGLT2 suggested to be held with morbid obesity and risk of yeast infection and candida mitral valve endocarditis/chronic wound leading to BKA on prior admission. Required milrinone prior, no BB. -I discussed with her the importance of weight loss.  Stated she can increase her protein intake, vegetables and aim to fast   Hx of PVCs -on mexiletine   Hx of CVA-right inferior cerebellar August 2024 possibly paradoxical embolization via PFO; continue apixaban. On lipitor and zetia.   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

## 2023-09-23 NOTE — Progress Notes (Signed)
 PROGRESS NOTE  Karen Bennett ZOX:[id number] DOB: 10/03/1975 DOA: 09/22/2023 PCP: [name], NP   LOS: 1 day   Brief Narrative / Interim history: 48-year-old female with history of morbid obesity, chronic systolic CHF, HTN, HLD, IDDM, hypothyroidism, prior DVT/PE on Eliquis, history of Candida mitral valve endocarditis in 2022, pulmonary hypertension, chronic "pulmonary, CKD 3B, right BKA, prior CVA, who comes into the hospital with worsening abdominal distention, lower extremity swelling and shortness of breath.  She was recently hospitalized and discharged in January 2025 for acute on chronic systolic CHF, required significant diuresis with midodrine augmentation and seen by advanced heart failure service.  Since discharge she tells me she has been doing well, compliant with her home medications as well as a low-sodium diet, but over the last several days-a week she has noticed increased swelling, decreased urination, and worsening shortness of breath.  She is unsure whether she gained any weight that she cannot weigh herself.  In the ED she was found to have acute kidney injury, hyperkalemia, and imaging and exam was consistent with fluid overload.  She was given Lasix and was admitted to the hospital   Subjective / 24h Interval events: Tells me that she is feeling bad, short of breath.  Starting to urinate a little bit more.  Assesement and Plan: Principal Problem:   Acute on chronic HFrEF (heart failure with reduced ejection fraction) (HCC) Active Problems:   Acute kidney injury superimposed on stage 3a chronic kidney disease (HCC)   History of pulmonary embolism   Insulin dependent type 2 diabetes mellitus (HCC)   Hyperlipidemia associated with type 2 diabetes mellitus (HCC)   Hypothyroidism  Principal problem Acute on chronic combined CHF, biventricular heart failure -patient was admitted to the hospital for significant fluid overload.  In and outs not accurate since admission,  but weight now seems to be up 40 kg when compared to mid January when she was last hospitalized.  I wonder whether this is accurate -She was started on IV diuresis, continue -Cardiology consulted, awaiting central line for CVL as well as Coox, based on that may await milrinone and transferred to [facility].  Active problems Acute kidney injury on chronic kidney disease stage IIIb-baseline creatinine around 2.2 in January, now increased to 3.7.  Continue Lasix, creatinine overall stable with diuresis.  Need to monitor strict ins and outs and closely watch renal function  Hyperkalemia-due to AKI, potassium borderline at 5.2.  On Lasix  Hyperphosphatemia-due to CKD  Hyponatremia-hypervolemic, continue diuresis  Anemia of chronic kidney disease-hemoglobin 11.4, stable, no bleeding  Type 2 diabetes mellitus-continue glargine, sliding scale  Hypothyroidism-continue Synthroid.  TSH normal  Hyperlipidemia, history of CVA-continue statin  History of PE-continue Eliquis  Obesity, morbid-BMI 71.  She would benefit from weight loss  Scheduled Meds:  apixaban  5 mg Oral BID   atorvastatin  40 mg Oral QPM   Chlorhexidine Gluconate Cloth  6 each Topical Daily   ezetimibe  10 mg Oral Daily   furosemide  40 mg Intravenous BID   insulin aspart  0-15 Units Subcutaneous TID WC   insulin aspart  0-5 Units Subcutaneous QHS   insulin glargine-yfgn  10 Units Subcutaneous QHS   levothyroxine  75 mcg Oral QAC breakfast   mexiletine  250 mg Oral Q12H   mupirocin ointment  1 Application Nasal BID   Continuous Infusions: PRN Meds:.acetaminophen **OR** acetaminophen, oxyCODONE  Current Outpatient Medications  Medication Instructions   apixaban (ELIQUIS) 5 mg, Oral, 2 times daily  atorvastatin (LIPITOR) 80 mg, Oral, Daily-1800   atorvastatin (LIPITOR) 40 mg, Oral, Every evening   Continuous Glucose Sensor (FREESTYLE LIBRE 3 SENSOR) MISC Place 1 sensor on the skin every 14 days. Use to check  glucose continuously   ezetimibe (ZETIA) 10 mg, Oral, Daily   ezetimibe (ZETIA) 10 mg, Oral, Daily   glucose blood test strip Use as instructed   glucose monitoring kit (FREESTYLE) monitoring kit 1 each, Does not apply, As needed   hydrOXYzine (VISTARIL) 50 mg, 2 times daily PRN   insulin lispro (HUMALOG) 1-6 Units, See admin instructions   Jardiance 25 mg, Oral, Daily   Lantus SoloStar 10 Units, Daily at bedtime   levothyroxine (SYNTHROID) 75 mcg, Daily before breakfast   methocarbamol (ROBAXIN) 750 mg, Every 8 hours PRN   mexiletine (MEXITIL) 250 mg, Oral, Every 12 hours   minoxidil (LONITEN) 5 mg, Oral, Daily   Mounjaro 2.5 mg, Every Wed   oxyCODONE (ROXICODONE) 15 mg, Oral, Every 6 hours PRN   potassium chloride (KLOR-CON) 10 MEQ tablet 10 mEq, Oral, 2 times daily   promethazine (PHENERGAN) 25 mg, Every 6 hours PRN   spironolactone (ALDACTONE) 12.5 mg, Oral, Daily   spironolactone (ALDACTONE) 12.5 mg, Oral, Daily   torsemide (DEMADEX) 80 mg, Oral, 2 times daily   torsemide (DEMADEX) 80 mg, Oral, Daily   traZODone (DESYREL) 100 mg, Oral, Daily at bedtime   venlafaxine XR (EFFEXOR-XR) 75 mg, Oral, Daily with breakfast    Diet Orders (From admission, onward)     Start     Ordered   09/22/23 0643  Diet heart healthy/carb modified Room service appropriate? Yes; Fluid consistency: Thin; Fluid restriction: 1200 mL Fluid  Diet effective now       Question Answer Comment  Diet-HS Snack? Nothing   Room service appropriate? Yes   Fluid consistency: Thin   Fluid restriction: 1200 mL Fluid      09/22/23 0645            DVT prophylaxis:  apixaban (ELIQUIS) tablet 5 mg   Lab Results  Component Value Date   PLT 225 09/23/2023      Code Status: Full Code  Family Communication: No family at bedside  Status is: Inpatient Remains inpatient appropriate because: Severity of illness   Level of care: Telemetry  Consultants:  Cardiology  Objective: Vitals:   09/22/23 1419  09/22/23 2027 09/23/23 0544 09/23/23 1149  BP: (!) 100/53 (!) 100/53 105/62 (!) 113/59  Pulse: (!) 57 73 (!) 57 (!) 55  Resp: 20   18  Temp: 98.7 F (37.1 C) (!) 97.4 F (36.3 C) (!) 97.5 F (36.4 C) 97.8 F (36.6 C)  TempSrc: Oral Oral Oral Oral  SpO2: 98% 93% 94% 92%  Weight: (!) 202 kg     Height:        Intake/Output Summary (Last 24 hours) at 09/23/2023 1349 Last data filed at 09/23/2023 1200 Gross per 24 hour  Intake 600 ml  Output --  Net 600 ml   Wt Readings from Last 3 Encounters:  09/22/23 (!) 202 kg  08/21/23 (!) 167.8 kg  03/21/23 (!) 164 kg    Examination:  Constitutional: NAD Eyes: no scleral icterus ENMT: Mucous membranes are moist.  Neck: normal, supple Respiratory: clear to auscultation bilaterally, no wheezing, no crackles.  Difficult exam due to body habitus Cardiovascular: Regular rate and rhythm, no murmurs / rubs / gallops.  1+ LE edema.  Abdomen: non distended, no tenderness. Bowel sounds positive.  Musculoskeletal: no clubbing / cyanosis.    Data Reviewed: I have independently reviewed following labs and imaging studies   CBC Recent Labs  Lab 09/22/23 0215 09/22/23 0220 09/23/23 0534  WBC 5.6  --  4.4  HGB 11.0* 12.2 11.4*  HCT 35.9* 36.0 38.1  PLT 248  --  225  MCV 87.6  --  89.6  MCH 26.8  --  26.8  MCHC 30.6  --  29.9*  RDW 14.4  --  14.5  LYMPHSABS 1.3  --   --   MONOABS 0.5  --   --   EOSABS 0.1  --   --   BASOSABS 0.0  --   --     Recent Labs  Lab 09/22/23 0215 09/22/23 0220 09/22/23 0656 09/22/23 0823 09/22/23 1607 09/23/23 0534  NA  --  130* 131*  --   --  131*  K  --  5.9* 5.0  --   --  5.2*  CL  --  96* 94*  --   --  96*  CO2  --   --  28  --   --  25  GLUCOSE  --  135* 133*  --   --  84  BUN  --  73* 73*  --   --  79*  CREATININE  --  3.70* 3.29*  --   --  3.23*  CALCIUM  --   --  8.7*  --   --  8.7*  AST  --   --  9*  --   --  11*  ALT  --   --  9  --   --  8  ALKPHOS  --   --  63  --   --  67  BILITOT   --   --  0.9  --   --  0.9  ALBUMIN  --   --  2.9*  --   --  2.8*  MG  --   --  2.2  --   --  2.4  LATICACIDVEN  --   --   --  0.7  --   --   TSH  --   --   --   --  4.243  --   BNP 572.3*  --   --   --   --   --     ------------------------------------------------------------------------------------------------------------------ No results for input(s): "CHOL", "HDL", "LDLCALC", "TRIG", "CHOLHDL", "LDLDIRECT" in the last 72 hours.  Lab Results  Component Value Date   HGBA1C 7.2 (H) 08/01/2023   ------------------------------------------------------------------------------------------------------------------ Recent Labs    09/22/23 1607  TSH 4.243    Cardiac Enzymes No results for input(s): "CKMB", "TROPONINI", "MYOGLOBIN" in the last 168 hours.  Invalid input(s): "CK" ------------------------------------------------------------------------------------------------------------------    Component Value Date/Time   BNP 572.3 (H) 09/22/2023 0215    CBG: Recent Labs  Lab 09/22/23 1725 09/22/23 2203 09/23/23 0738  GLUCAP 122* 75 89    Recent Results (from the past 240 hours)  MRSA Next Gen by PCR, Nasal     Status: Abnormal   Collection Time: 09/22/23 10:06 PM   Specimen: Nasal Mucosa; Nasal Swab  Result Value Ref Range Status   MRSA by PCR Next Gen DETECTED (A) NOT DETECTED Final    Comment: RESULT CALLED TO, READ BACK BY AND VERIFIED WITH: SIMON,K AT 0429 ON 09/23/23 BY LUZOLOP (NOTE) The GeneXpert MRSA Assay (FDA approved for NASAL specimens only), is one component of a comprehensive MRSA colonization surveillance  program. It is not intended to diagnose MRSA infection nor to guide or monitor treatment for MRSA infections. Test performance is not FDA approved in patients less than 14 years old. Performed at [facility], [address], [place], [place] [address]      Radiology Studies: No results found.   Pamella Pert, MD, PhD [facility]  Between 7 am - 7 pm I am available, please contact me via Amion (for emergencies) or Securechat (non urgent messages)  Between 7 pm - 7 am I am not available, please contact night coverage MD/APP via Amion

## 2023-09-23 NOTE — Plan of Care (Signed)
  Problem: Education: Goal: Ability to describe self-care measures that may prevent or decrease complications (Diabetes Survival Skills Education) will improve Outcome: Progressing   Problem: Fluid Volume: Goal: Ability to maintain a balanced intake and output will improve Outcome: Progressing   Problem: Metabolic: Goal: Ability to maintain appropriate glucose levels will improve Outcome: Progressing   Problem: Nutritional: Goal: Maintenance of adequate nutrition will improve Outcome: Progressing   Problem: Skin Integrity: Goal: Risk for impaired skin integrity will decrease Outcome: Progressing   Problem: Clinical Measurements: Goal: Will remain free from infection Outcome: Progressing   Problem: Nutrition: Goal: Adequate nutrition will be maintained Outcome: Progressing   Problem: Coping: Goal: Level of anxiety will decrease Outcome: Progressing

## 2023-09-23 NOTE — Progress Notes (Signed)
 Pt refused to have her CBG tonight. States that RN earlier states it was ok to use her CGM. Pt educated to policy while inpatient and that we would need to use our equipment for BS levels if we were to administer SS insulin. NP notified to review and found no changes to orders or notes placed in chart. Documentation review from day shift//only am CBG was entered. CGM current reading at 114 at 2226 this evening. Pt continued to refuse monitoring. Plan of care ongoing.

## 2023-09-24 ENCOUNTER — Inpatient Hospital Stay (HOSPITAL_COMMUNITY)

## 2023-09-24 ENCOUNTER — Other Ambulatory Visit: Payer: Self-pay

## 2023-09-24 DIAGNOSIS — I5023 Acute on chronic systolic (congestive) heart failure: Secondary | ICD-10-CM | POA: Diagnosis not present

## 2023-09-24 HISTORY — PX: IR FLUORO GUIDE CV LINE RIGHT: IMG2283

## 2023-09-24 LAB — CBC
HCT: 36.8 % (ref 36.0–46.0)
Hemoglobin: 11.2 g/dL — ABNORMAL LOW (ref 12.0–15.0)
MCH: 27.2 pg (ref 26.0–34.0)
MCHC: 30.4 g/dL (ref 30.0–36.0)
MCV: 89.3 fL (ref 80.0–100.0)
Platelets: 234 10*3/uL (ref 150–400)
RBC: 4.12 MIL/uL (ref 3.87–5.11)
RDW: 14.3 % (ref 11.5–15.5)
WBC: 5.3 10*3/uL (ref 4.0–10.5)
nRBC: 0 % (ref 0.0–0.2)

## 2023-09-24 LAB — MRSA NEXT GEN BY PCR, NASAL: MRSA by PCR Next Gen: NOT DETECTED

## 2023-09-24 LAB — COMPREHENSIVE METABOLIC PANEL
ALT: 8 U/L (ref 0–44)
AST: 8 U/L — ABNORMAL LOW (ref 15–41)
Albumin: 2.8 g/dL — ABNORMAL LOW (ref 3.5–5.0)
Alkaline Phosphatase: 70 U/L (ref 38–126)
Anion gap: 11 (ref 5–15)
BUN: 78 mg/dL — ABNORMAL HIGH (ref 6–20)
CO2: 24 mmol/L (ref 22–32)
Calcium: 8.5 mg/dL — ABNORMAL LOW (ref 8.9–10.3)
Chloride: 96 mmol/L — ABNORMAL LOW (ref 98–111)
Creatinine, Ser: 3.35 mg/dL — ABNORMAL HIGH (ref 0.44–1.00)
GFR, Estimated: 16 mL/min — ABNORMAL LOW (ref >60)
Glucose, Bld: 99 mg/dL (ref 70–99)
Potassium: 5.1 mmol/L (ref 3.5–5.1)
Sodium: 131 mmol/L — ABNORMAL LOW (ref 135–145)
Total Bilirubin: 1 mg/dL (ref 0.0–1.2)
Total Protein: 7.3 g/dL (ref 6.5–8.1)

## 2023-09-24 LAB — GLUCOSE, CAPILLARY
Glucose-Capillary: 107 mg/dL — ABNORMAL HIGH (ref 70–99)
Glucose-Capillary: 119 mg/dL — ABNORMAL HIGH (ref 70–99)
Glucose-Capillary: 81 mg/dL (ref 70–99)
Glucose-Capillary: 79 mg/dL (ref 70–99)

## 2023-09-24 LAB — MAGNESIUM: Magnesium: 2.4 mg/dL (ref 1.7–2.4)

## 2023-09-24 MED ORDER — LORAZEPAM 1 MG PO TABS
1.0000 mg | ORAL_TABLET | Freq: Once | ORAL | Status: AC
  Administered 2023-09-24: 1 mg via ORAL
  Filled 2023-09-24: qty 1

## 2023-09-24 MED ORDER — HYDROXYZINE HCL 10 MG PO TABS
10.0000 mg | ORAL_TABLET | Freq: Three times a day (TID) | ORAL | Status: DC | PRN
  Administered 2023-09-24 – 2023-10-12 (×20): 10 mg via ORAL
  Filled 2023-09-24 (×20): qty 1

## 2023-09-24 MED ORDER — LIDOCAINE-EPINEPHRINE 1 %-1:100000 IJ SOLN
INTRAMUSCULAR | Status: AC
  Filled 2023-09-24: qty 1

## 2023-09-24 MED ORDER — PROMETHAZINE (PHENERGAN) 6.25MG IN NS 50ML IVPB
6.2500 mg | Freq: Four times a day (QID) | INTRAVENOUS | Status: DC | PRN
  Administered 2023-09-24 – 2023-10-09 (×26): 6.25 mg via INTRAVENOUS
  Filled 2023-09-24 (×6): qty 6.25
  Filled 2023-09-24 (×2): qty 50
  Filled 2023-09-24 (×4): qty 6.25
  Filled 2023-09-24: qty 50
  Filled 2023-09-24: qty 6.25
  Filled 2023-09-24: qty 50
  Filled 2023-09-24 (×4): qty 6.25
  Filled 2023-09-24 (×2): qty 50
  Filled 2023-09-24 (×6): qty 6.25

## 2023-09-24 MED ORDER — LORAZEPAM 2 MG/ML IJ SOLN: 0.5000 mg | Freq: Once | INTRAMUSCULAR | Status: DC

## 2023-09-24 MED ORDER — FENTANYL CITRATE PF 50 MCG/ML IJ SOSY
12.5000 ug | PREFILLED_SYRINGE | Freq: Once | INTRAMUSCULAR | Status: AC | PRN
  Administered 2023-09-24: 12.5 ug via INTRAVENOUS
  Filled 2023-09-24: qty 1

## 2023-09-24 MED ORDER — MILRINONE LACTATE IN DEXTROSE 20-5 MG/100ML-% IV SOLN
0.1250 ug/kg/min | INTRAVENOUS | Status: DC
  Administered 2023-09-24 – 2023-10-11 (×59): 0.25 ug/kg/min via INTRAVENOUS
  Administered 2023-10-12: 0.125 ug/kg/min via INTRAVENOUS
  Filled 2023-09-24 (×62): qty 100

## 2023-09-24 MED ORDER — SODIUM CHLORIDE 0.9% FLUSH
10.0000 mL | Freq: Two times a day (BID) | INTRAVENOUS | Status: DC
  Administered 2023-09-24: 20 mL
  Administered 2023-09-25 – 2023-09-26 (×3): 10 mL
  Administered 2023-09-26: 20 mL
  Administered 2023-09-27 – 2023-09-28 (×2): 10 mL
  Administered 2023-09-28: 20 mL
  Administered 2023-09-28 – 2023-10-03 (×10): 10 mL
  Administered 2023-10-03: 20 mL
  Administered 2023-10-04 – 2023-10-12 (×15): 10 mL

## 2023-09-24 MED ORDER — ORAL CARE MOUTH RINSE: 15.0000 mL | OROMUCOSAL | Status: DC | PRN

## 2023-09-24 MED ORDER — SODIUM CHLORIDE 0.9% FLUSH: 10.0000 mL | INTRAVENOUS | Status: DC | PRN

## 2023-09-24 MED ORDER — FENTANYL CITRATE PF 50 MCG/ML IJ SOSY
12.5000 ug | PREFILLED_SYRINGE | INTRAMUSCULAR | Status: AC | PRN
  Administered 2023-09-25: 12.5 ug via INTRAVENOUS
  Filled 2023-09-24: qty 1

## 2023-09-24 NOTE — TOC Progression Note (Signed)
 Transition of Care [facility]) - Inpatient Brief Assessment  Patient Details  Name: Karen Bennett MRN: [id number] Date of Birth: [birth year]  Transition of Care [facility]) CM/SW Contact:    Ewing Schlein, LCSW Phone Number: 09/24/2023, 11:11 AM  Clinical Narrative: Patient is from home alone. Patient was set up with [facility] services through [facility] on 08/21/23, but declined services so she is not currently active with any home services. No TOC needs identified at this time.  Transition of Care Asessment: Insurance and Status: Insurance coverage has been reviewed Patient has primary care physician: Yes Home environment has been reviewed: Resides alone in an apartment Prior level of function:: Independent at baseline Prior/Current Home Services: No current home services Social Drivers of Health Review: SDOH reviewed no interventions necessary Readmission risk has been reviewed: Yes Transition of care needs: no transition of care needs at this time  Barriers to Discharge: Continued Medical Work up  Social Determinants of Health (SDOH) Interventions SDOH Screenings   Food Insecurity: No Food Insecurity (09/22/2023)  Housing: Low Risk  (09/22/2023)  Transportation Needs: No Transportation Needs (09/22/2023)  Utilities: Not At Risk (09/22/2023)  Financial Resource Strain: Low Risk  (09/07/2023)   Received from [facility]  Physical Activity: Sufficiently Active (05/07/2023)   Received from [facility]  Social Connections: Moderately Integrated (05/07/2023)   Received from [facility]  Stress: Stress Concern Present (05/07/2023)   Received from [facility]  Tobacco Use: High Risk (09/22/2023)   Readmission Risk Interventions    09/24/2023   11:10 AM 08/02/2023    1:28 PM 02/13/2023   11:23 AM  Readmission Risk Prevention Plan  Transportation Screening Complete Complete Complete  PCP or Specialist Appt within 3-5 Days   Complete  HRI or Home Care Consult   Complete  Social Work Consult for  Recovery Care Planning/Counseling   Complete  Palliative Care Screening   Complete  Medication Review Oceanographer) Complete Complete Complete  PCP or Specialist appointment within 3-5 days of discharge  Complete   HRI or Home Care Consult Complete Complete   SW Recovery Care/Counseling Consult Complete Complete   Palliative Care Screening Not Applicable Not Applicable   Skilled Nursing Facility Not Applicable Not Applicable

## 2023-09-24 NOTE — Progress Notes (Signed)
 Peripherally Inserted Central Catheter Placement  The IV Nurse has discussed with the patient and/or persons authorized to consent for the patient, the purpose of this procedure and the potential benefits and risks involved with this procedure.  The benefits include less needle sticks, lab draws from the catheter, and the patient may be discharged home with the catheter. Risks include, but not limited to, infection, bleeding, blood clot (thrombus formation), and puncture of an artery; nerve damage and irregular heartbeat and possibility to perform a PICC exchange if needed/ordered by physician.  Alternatives to this procedure were also discussed.  Bard Power PICC patient education guide, fact sheet on infection prevention and patient information card has been provided to patient /or left at bedside.    PICC Placement Documentation  PICC Double Lumen 09/24/23 Left Cephalic 48 cm 0 cm (Active)  Indication for Insertion or Continuance of Line Chronic illness with exacerbations (CF, Sickle Cell, etc.);Vasoactive infusions 09/24/23 1943  Exposed Catheter (cm) 0 cm 09/24/23 1943  Site Assessment Clean, Dry, Intact 09/24/23 1943  Lumen #1 Status Flushed;Saline locked;Blood return noted 09/24/23 1943  Lumen #2 Status Flushed;Saline locked;Blood return noted 09/24/23 1943  Dressing Type Transparent;Securing device 09/24/23 1943  Dressing Status Antimicrobial disc/dressing in place 09/24/23 1943  Line Care Connections checked and tightened 09/24/23 1943  Line Adjustment (NICU/IV Team Only) No 09/24/23 1943  Dressing Intervention New dressing;Adhesive placed at insertion site (IV team only) 09/24/23 1943  Dressing Change Due 10/01/23 09/24/23 1943       Elenore Paddy 09/24/2023, 7:44 PM

## 2023-09-24 NOTE — Progress Notes (Signed)
 Rounding Note    Patient Name: Karen Bennett Date of Encounter: 09/24/2023  [facility] Cardiologist: Chilton Si, MD   Subjective   Unable to lie flat for central IV access due to dyspnea. She is roughly 75 lb above her DC weight last month.  Inpatient Medications    Scheduled Meds:  apixaban  5 mg Oral BID   atorvastatin  40 mg Oral QPM   Chlorhexidine Gluconate Cloth  6 each Topical Daily   ezetimibe  10 mg Oral Daily   furosemide  40 mg Intravenous BID   insulin aspart  0-15 Units Subcutaneous TID WC   insulin aspart  0-5 Units Subcutaneous QHS   insulin glargine-yfgn  10 Units Subcutaneous QHS   levothyroxine  75 mcg Oral QAC breakfast   mexiletine  250 mg Oral Q12H   mupirocin ointment  1 Application Nasal BID   Continuous Infusions:  promethazine (PHENERGAN) injection (IM or IVPB) Stopped (09/24/23 1115)   PRN Meds: acetaminophen **OR** acetaminophen, oxyCODONE, promethazine (PHENERGAN) injection (IM or IVPB)   Vital Signs    Vitals:   09/24/23 0425 09/24/23 0645 09/24/23 0700 09/24/23 0925  BP: (!) 93/56   106/61  Pulse: 62   (!) 58  Resp: 18     Temp: 98 F (36.7 C)     TempSrc:      SpO2: 93%   (!) 88%  Weight:  (!) 215 kg (!) 201.5 kg   Height:        Intake/Output Summary (Last 24 hours) at 09/24/2023 1208 Last data filed at 09/24/2023 0630 Gross per 24 hour  Intake 720 ml  Output 700 ml  Net 20 ml      09/24/2023    7:00 AM 09/24/2023    6:45 AM 09/22/2023    2:19 PM  Last 3 Weights  Weight (lbs) 444 lb 3.2 oz 474 lb 445 lb 5.3 oz  Weight (kg) 201.488 kg 215.005 kg 202 kg      Telemetry    NSR with occ PVCs, rare couplets - Personally Reviewed  ECG    NSR, RBBB - Personally Reviewed  Physical Exam  Super-obesity GEN: No acute distress.   Neck: unable to see JVD Cardiac: RRR, 2/6 holosystolic LLSB murmur, no diastolic murmurs, rubs, or gallops.  Respiratory: Clear to auscultation bilaterally. GI: Soft, nontender,  non-distended  MS: s/p R BKA. Edema amputation stump and contralateral calf Neuro:  Nonfocal  Psych: Normal affect   Labs    High Sensitivity Troponin:  No results for input(s): "TROPONINIHS" in the last 720 hours.   Chemistry Recent Labs  Lab 09/22/23 0656 09/23/23 0534 09/24/23 0535  NA 131* 131* 131*  K 5.0 5.2* 5.1  CL 94* 96* 96*  CO2 28 25 24   GLUCOSE 133* 84 99  BUN 73* 79* 78*  CREATININE 3.29* 3.23* 3.35*  CALCIUM 8.7* 8.7* 8.5*  MG 2.2 2.4 2.4  PROT 7.4 7.3 7.3  ALBUMIN 2.9* 2.8* 2.8*  AST 9* 11* 8*  ALT 9 8 8   ALKPHOS 63 67 70  BILITOT 0.9 0.9 1.0  GFRNONAA 17* 17* 16*  ANIONGAP 9 10 11     Lipids No results for input(s): "CHOL", "TRIG", "HDL", "LABVLDL", "LDLCALC", "CHOLHDL" in the last 168 hours.  Hematology Recent Labs  Lab 09/22/23 0215 09/22/23 0220 09/23/23 0534 09/24/23 0535  WBC 5.6  --  4.4 5.3  RBC 4.10  --  4.25 4.12  HGB 11.0* 12.2 11.4* 11.2*  HCT 35.9*  36.0 38.1 36.8  MCV 87.6  --  89.6 89.3  MCH 26.8  --  26.8 27.2  MCHC 30.6  --  29.9* 30.4  RDW 14.4  --  14.5 14.3  PLT 248  --  225 234   Thyroid  Recent Labs  Lab 09/22/23 1607  TSH 4.243    BNP Recent Labs  Lab 09/22/23 0215  BNP 572.3*    DDimer No results for input(s): "DDIMER" in the last 168 hours.   Radiology    No results found.  Cardiac Studies   Echo 08/02/23: 1. No apical thrombus with Definity. Left ventricular ejection fraction,  by estimation, is 35 to 40%. Left ventricular ejection fraction by 2D MOD  biplane is 40.8 %. The left ventricle has moderately decreased function.  The left ventricle demonstrates  regional wall motion abnormalities (see scoring diagram/findings for  description). The left ventricular internal cavity size was mildly  dilated. Left ventricular diastolic parameters are consistent with Grade  III diastolic dysfunction (restrictive).  Elevated left ventricular end-diastolic pressure. There is abnormal  (paradoxical) septal  motion, consistent with right ventricular volume  overload. There is severe akinesis of the left ventricular, mid inferior  wall and inferolateral wall.   2. Right ventricular systolic function is low normal. The right  ventricular size is normal. There is severely elevated pulmonary artery  systolic pressure. The estimated right ventricular systolic pressure is  69.5 mmHg.   3. Left atrial size was moderately dilated.   4. Right atrial size was severely dilated.   5. The mitral valve is abnormal. Mild mitral valve regurgitation.   6. The tricuspid valve is abnormal. Tricuspid valve regurgitation is  moderate.   7. The aortic valve is tricuspid. Aortic valve regurgitation is not  visualized. No aortic stenosis is present.   8. The inferior vena cava is dilated in size with <50% respiratory  variability, suggesting right atrial pressure of 15 mmHg.    Patient Profile     48 y.o. 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   Last admission for CHF in Jan 2025 required milrinone for diuresis.  Assessment & Plan    Acute on chronic systolic heart failure RV failure Acute on chronic renal failure - has required milrinone for diuresis earlier this year - discharged on 80 mg torsemide BID, 12.5 mg spironolactone - admission weight last admission was 479 lbs --> discharge weight was 370 lbs (Jan 2025). Now 444 lb - BNP 572 - baseline is probably in 200-300 range - currently receiving 40 mg IV lasix BID - creatinine continues to rise - sCr now 3.35 - BUN 78 - albumin 2.8 - 700 cc urine output charted overnight  Despite lack of central IV access, with low BP  and worsening renal parameters our best option is IV milrinone and aggressive diuresis. Risk of ventricular arrhythmia, but she tolerated the milrinone infusion well last month.   PVCs - on mexiletine Hx of PE/DVT On chronic eliquis Super morbid obesity CKD stage 3 -baseline felt near 1.7 Right BKA Hx of candida albicans bacteremia and MV endocarditis OSA - refuses CPAP Tobacco abuse - have discussed cessation        For questions or  updates, please contact [facility] Please consult [email or web address] for contact info under        Signed, Marcelino Duster, PA  09/24/2023, 12:08 PM

## 2023-09-24 NOTE — Progress Notes (Signed)
 PROGRESS NOTE  Karen Bennett MVH:[id number] DOB: Feb 07, 1976 DOA: 09/22/2023 PCP: Karen Rutherford, NP   LOS: 2 days   Brief Narrative / Interim history: 48-year-old female with history of morbid obesity, chronic systolic CHF, HTN, HLD, IDDM, hypothyroidism, prior DVT/PE on Eliquis, history of Candida mitral valve endocarditis in 2022, pulmonary hypertension, chronic "pulmonary, CKD 3B, right BKA, prior CVA, who comes into the hospital with worsening abdominal distention, lower extremity swelling and shortness of breath.  She was recently hospitalized and discharged in January 2025 for acute on chronic systolic CHF, required significant diuresis with midodrine augmentation and seen by advanced heart failure service.  Since discharge she tells me she has been doing well, compliant with her home medications as well as a low-sodium diet, but over the last several days-a week she has noticed increased swelling, decreased urination, and worsening shortness of breath.  She is unsure whether she gained any weight that she cannot weigh herself.  In the ED she was found to have acute kidney injury, hyperkalemia, and imaging and exam was consistent with fluid overload.  She was given Lasix and was admitted to the hospital   Subjective / 24h Interval events: Somewhat emotional this morning.  Starting to urinate a little bit more, complains of abdominal fullness and leg pain  Assesement and Plan: Principal Problem:   Acute on chronic HFrEF (heart failure with reduced ejection fraction) (HCC) Active Problems:   Acute kidney injury superimposed on stage 3a chronic kidney disease (HCC)   History of pulmonary embolism   Insulin dependent type 2 diabetes mellitus (HCC)   Hyperlipidemia associated with type 2 diabetes mellitus (HCC)   Hypothyroidism  Principal problem Acute on chronic combined CHF, biventricular heart failure -patient was admitted to the hospital for significant fluid overload.  Not having  significant output, defer to cardiology increasing diuretics versus inotropes -She was started on IV diuresis, continue -Cardiology consulted, central line to be placed today by IR for CVP / Coox, based on that may need milrinone and transferred to [facility].  Active problems Acute kidney injury on chronic kidney disease stage IIIb-baseline creatinine around 2.2 in January, now increased to 3.7.  Creatinine overall stable  Hyperkalemia-due to AKI, potassium borderline at 5.1.  On Lasix  Hyperphosphatemia-due to CKD  Hyponatremia-hypervolemic, continue diuresis  Anemia of chronic kidney disease-hemoglobin 11.2, stable, no bleeding  Type 2 diabetes mellitus, poorly controlled-continue glargine, sliding scale  Lab Results  Component Value Date   HGBA1C 7.2 (H) 08/01/2023   CBG (last 3)  Recent Labs    09/22/23 2203 09/23/23 0738 09/24/23 0758  GLUCAP 75 89 107*    Hypothyroidism-continue Synthroid.  TSH normal  Hyperlipidemia, history of CVA-continue statin  History of PE-continue Eliquis  Obesity, morbid-BMI 71.  She would benefit from weight loss  Scheduled Meds:  apixaban  5 mg Oral BID   atorvastatin  40 mg Oral QPM   Chlorhexidine Gluconate Cloth  6 each Topical Daily   ezetimibe  10 mg Oral Daily   furosemide  40 mg Intravenous BID   insulin aspart  0-15 Units Subcutaneous TID WC   insulin aspart  0-5 Units Subcutaneous QHS   insulin glargine-yfgn  10 Units Subcutaneous QHS   levothyroxine  75 mcg Oral QAC breakfast   mexiletine  250 mg Oral Q12H   mupirocin ointment  1 Application Nasal BID   Continuous Infusions:  promethazine (PHENERGAN) injection (IM or IVPB) 6.25 mg (09/24/23 0919)   PRN Meds:.acetaminophen **OR** acetaminophen, oxyCODONE,  promethazine (PHENERGAN) injection (IM or IVPB)  Current Outpatient Medications  Medication Instructions   apixaban (ELIQUIS) 5 mg, Oral, 2 times daily   atorvastatin (LIPITOR) 80 mg, Oral, Daily-1800    atorvastatin (LIPITOR) 40 mg, Oral, Every evening   Continuous Glucose Sensor (FREESTYLE LIBRE 3 SENSOR) MISC Place 1 sensor on the skin every 14 days. Use to check glucose continuously   ezetimibe (ZETIA) 10 mg, Oral, Daily   ezetimibe (ZETIA) 10 mg, Oral, Daily   glucose blood test strip Use as instructed   glucose monitoring kit (FREESTYLE) monitoring kit 1 each, Does not apply, As needed   hydrOXYzine (VISTARIL) 50 mg, 2 times daily PRN   insulin lispro (HUMALOG) 1-6 Units, See admin instructions   Jardiance 25 mg, Oral, Daily   Lantus SoloStar 10 Units, Daily at bedtime   levothyroxine (SYNTHROID) 75 mcg, Daily before breakfast   methocarbamol (ROBAXIN) 750 mg, Every 8 hours PRN   mexiletine (MEXITIL) 250 mg, Oral, Every 12 hours   minoxidil (LONITEN) 5 mg, Oral, Daily   Mounjaro 2.5 mg, Every Wed   oxyCODONE (ROXICODONE) 15 mg, Oral, Every 6 hours PRN   potassium chloride (KLOR-CON) 10 MEQ tablet 10 mEq, Oral, 2 times daily   promethazine (PHENERGAN) 25 mg, Every 6 hours PRN   spironolactone (ALDACTONE) 12.5 mg, Oral, Daily   spironolactone (ALDACTONE) 12.5 mg, Oral, Daily   torsemide (DEMADEX) 80 mg, Oral, 2 times daily   torsemide (DEMADEX) 80 mg, Oral, Daily   traZODone (DESYREL) 100 mg, Oral, Daily at bedtime   venlafaxine XR (EFFEXOR-XR) 75 mg, Oral, Daily with breakfast    Diet Orders (From admission, onward)     Start     Ordered   09/22/23 0643  Diet heart healthy/carb modified Room service appropriate? Yes; Fluid consistency: Thin; Fluid restriction: 1200 mL Fluid  Diet effective now       Question Answer Comment  Diet-HS Snack? Nothing   Room service appropriate? Yes   Fluid consistency: Thin   Fluid restriction: 1200 mL Fluid      09/22/23 0645            DVT prophylaxis:  apixaban (ELIQUIS) tablet 5 mg   Lab Results  Component Value Date   PLT 234 09/24/2023      Code Status: Full Code  Family Communication: No family at bedside  Status is:  Inpatient Remains inpatient appropriate because: Severity of illness   Level of care: Telemetry  Consultants:  Cardiology  Objective: Vitals:   09/24/23 0425 09/24/23 0645 09/24/23 0700 09/24/23 0925  BP: (!) 93/56   106/61  Pulse: 62   (!) 58  Resp: 18     Temp: 98 F (36.7 C)     TempSrc:      SpO2: 93%   (!) 88%  Weight:  (!) 215 kg (!) 201.5 kg   Height:        Intake/Output Summary (Last 24 hours) at 09/24/2023 1151 Last data filed at 09/24/2023 0630 Gross per 24 hour  Intake 1080 ml  Output 700 ml  Net 380 ml   Wt Readings from Last 3 Encounters:  09/24/23 (!) 201.5 kg  08/21/23 (!) 167.8 kg  03/21/23 (!) 164 kg    Examination:  Constitutional: NAD Eyes: lids and conjunctivae normal, no scleral icterus ENMT: mmm Neck: normal, supple Respiratory: clear to auscultation bilaterally, no wheezing, no crackles.  Difficult exam due to body habitus Cardiovascular: Regular rate and rhythm, no murmurs / rubs / gallops.  Lower extremity edema present. Abdomen: soft, no distention, no tenderness. Bowel sounds positive.  Skin: no rashes Neurologic: no focal deficits, equal strength   Data Reviewed: I have independently reviewed following labs and imaging studies   CBC Recent Labs  Lab 09/22/23 0215 09/22/23 0220 09/23/23 0534 09/24/23 0535  WBC 5.6  --  4.4 5.3  HGB 11.0* 12.2 11.4* 11.2*  HCT 35.9* 36.0 38.1 36.8  PLT 248  --  225 234  MCV 87.6  --  89.6 89.3  MCH 26.8  --  26.8 27.2  MCHC 30.6  --  29.9* 30.4  RDW 14.4  --  14.5 14.3  LYMPHSABS 1.3  --   --   --   MONOABS 0.5  --   --   --   EOSABS 0.1  --   --   --   BASOSABS 0.0  --   --   --     Recent Labs  Lab 09/22/23 0215 09/22/23 0220 09/22/23 0656 09/22/23 0823 09/22/23 1607 09/23/23 0534 09/24/23 0535  NA  --  130* 131*  --   --  131* 131*  K  --  5.9* 5.0  --   --  5.2* 5.1  CL  --  96* 94*  --   --  96* 96*  CO2  --   --  28  --   --  25 24  GLUCOSE  --  135* 133*  --   --  84 99   BUN  --  73* 73*  --   --  79* 78*  CREATININE  --  3.70* 3.29*  --   --  3.23* 3.35*  CALCIUM  --   --  8.7*  --   --  8.7* 8.5*  AST  --   --  9*  --   --  11* 8*  ALT  --   --  9  --   --  8 8  ALKPHOS  --   --  63  --   --  67 70  BILITOT  --   --  0.9  --   --  0.9 1.0  ALBUMIN  --   --  2.9*  --   --  2.8* 2.8*  MG  --   --  2.2  --   --  2.4 2.4  LATICACIDVEN  --   --   --  0.7  --   --   --   TSH  --   --   --   --  4.243  --   --   BNP 572.3*  --   --   --   --   --   --     ------------------------------------------------------------------------------------------------------------------ No results for input(s): "CHOL", "HDL", "LDLCALC", "TRIG", "CHOLHDL", "LDLDIRECT" in the last 72 hours.  Lab Results  Component Value Date   HGBA1C 7.2 (H) 08/01/2023   ------------------------------------------------------------------------------------------------------------------ Recent Labs    09/22/23 1607  TSH 4.243    Cardiac Enzymes No results for input(s): "CKMB", "TROPONINI", "MYOGLOBIN" in the last 168 hours.  Invalid input(s): "CK" ------------------------------------------------------------------------------------------------------------------    Component Value Date/Time   BNP 572.3 (H) 09/22/2023 0215    CBG: Recent Labs  Lab 09/22/23 1725 09/22/23 2203 09/23/23 0738 09/24/23 0758  GLUCAP 122* 75 89 107*    Recent Results (from the past 240 hours)  MRSA Next Gen by PCR, Nasal     Status: Abnormal   Collection Time: 09/22/23  10:06 PM   Specimen: Nasal Mucosa; Nasal Swab  Result Value Ref Range Status   MRSA by PCR Next Gen DETECTED (A) NOT DETECTED Final    Comment: RESULT CALLED TO, READ BACK BY AND VERIFIED WITH: SIMON,K AT 0429 ON 09/23/23 BY LUZOLOP (NOTE) The GeneXpert MRSA Assay (FDA approved for NASAL specimens only), is one component of a comprehensive MRSA colonization surveillance program. It is not intended to diagnose MRSA infection nor to  guide or monitor treatment for MRSA infections. Test performance is not FDA approved in patients less than 46 years old. Performed at [facility], [address], [place], [place] [address]      Radiology Studies: No results found.   Karen Pert, MD, PhD [facility]  Between 7 am - 7 pm I am available, please contact me via Amion (for emergencies) or Securechat (non urgent messages)  Between 7 pm - 7 am I am not available, please contact night coverage MD/APP via Amion

## 2023-09-24 NOTE — Plan of Care (Signed)
  Problem: Education: Goal: Ability to describe self-care measures that may prevent or decrease complications (Diabetes Survival Skills Education) will improve Outcome: Progressing   Problem: Coping: Goal: Ability to adjust to condition or change in health will improve Outcome: Progressing   Problem: Health Behavior/Discharge Planning: Goal: Ability to identify and utilize available resources and services will improve Outcome: Progressing

## 2023-09-24 NOTE — Progress Notes (Signed)
 Report given to [facility]. Patient will be transported on a specialized bed.

## 2023-09-24 NOTE — Plan of Care (Signed)

## 2023-09-24 NOTE — Progress Notes (Signed)
 Secure chat sent to MD regarding PICC placement. Pt requires IR to place all future PICC'S due to subclavian stenosis.

## 2023-09-25 ENCOUNTER — Inpatient Hospital Stay (HOSPITAL_COMMUNITY)

## 2023-09-25 DIAGNOSIS — I2721 Secondary pulmonary arterial hypertension: Secondary | ICD-10-CM

## 2023-09-25 DIAGNOSIS — N189 Chronic kidney disease, unspecified: Secondary | ICD-10-CM

## 2023-09-25 DIAGNOSIS — R079 Chest pain, unspecified: Secondary | ICD-10-CM

## 2023-09-25 DIAGNOSIS — I5082 Biventricular heart failure: Secondary | ICD-10-CM | POA: Diagnosis not present

## 2023-09-25 DIAGNOSIS — I5023 Acute on chronic systolic (congestive) heart failure: Secondary | ICD-10-CM | POA: Diagnosis not present

## 2023-09-25 LAB — ECHOCARDIOGRAM LIMITED
Area-P 1/2: 4.52 cm2
Calc EF: 44.7 %
Height: 66 in
MV M vel: 4.69 m/s
MV Peak grad: 88 mmHg
Radius: 0.6 cm
S' Lateral: 6.1 cm
Single Plane A2C EF: 44.6 %
Single Plane A4C EF: 45.2 %
Weight: 7037.08 [oz_av]

## 2023-09-25 LAB — BASIC METABOLIC PANEL
Anion gap: 12 (ref 5–15)
BUN: 76 mg/dL — ABNORMAL HIGH (ref 6–20)
CO2: 22 mmol/L (ref 22–32)
Calcium: 8.1 mg/dL — ABNORMAL LOW (ref 8.9–10.3)
Chloride: 93 mmol/L — ABNORMAL LOW (ref 98–111)
Creatinine, Ser: 3.26 mg/dL — ABNORMAL HIGH (ref 0.44–1.00)
GFR, Estimated: 17 mL/min — ABNORMAL LOW (ref >60)
Glucose, Bld: 156 mg/dL — ABNORMAL HIGH (ref 70–99)
Potassium: 4.4 mmol/L (ref 3.5–5.1)
Sodium: 127 mmol/L — ABNORMAL LOW (ref 135–145)

## 2023-09-25 LAB — CBC
HCT: 32.5 % — ABNORMAL LOW (ref 36.0–46.0)
Hemoglobin: 10 g/dL — ABNORMAL LOW (ref 12.0–15.0)
MCH: 27.4 pg (ref 26.0–34.0)
MCHC: 30.8 g/dL (ref 30.0–36.0)
MCV: 89 fL (ref 80.0–100.0)
Platelets: 198 10*3/uL (ref 150–400)
RBC: 3.65 MIL/uL — ABNORMAL LOW (ref 3.87–5.11)
RDW: 14.1 % (ref 11.5–15.5)
WBC: 5.5 10*3/uL (ref 4.0–10.5)
nRBC: 0 % (ref 0.0–0.2)

## 2023-09-25 LAB — GLUCOSE, CAPILLARY
Glucose-Capillary: 70 mg/dL (ref 70–99)
Glucose-Capillary: 81 mg/dL (ref 70–99)
Glucose-Capillary: 81 mg/dL (ref 70–99)
Glucose-Capillary: 80 mg/dL (ref 70–99)

## 2023-09-25 LAB — COOXEMETRY PANEL
Carboxyhemoglobin: 2.5 % — ABNORMAL HIGH (ref 0.5–1.5)
Methemoglobin: <0.7 % (ref 0.0–1.5)
O2 Saturation: 75.7 %
Total hemoglobin: 10.2 g/dL — ABNORMAL LOW (ref 12.0–16.0)

## 2023-09-25 LAB — MAGNESIUM: Magnesium: 2.3 mg/dL (ref 1.7–2.4)

## 2023-09-25 MED ORDER — FUROSEMIDE 10 MG/ML IJ SOLN
10.0000 mg/h | INTRAVENOUS | Status: DC
  Administered 2023-09-25 – 2023-09-26 (×2): 8 mg/h via INTRAVENOUS
  Administered 2023-09-27: 10 mg/h via INTRAVENOUS
  Filled 2023-09-25 (×3): qty 20

## 2023-09-25 MED ORDER — FENTANYL CITRATE PF 50 MCG/ML IJ SOSY
12.5000 ug | PREFILLED_SYRINGE | INTRAMUSCULAR | Status: DC | PRN
  Administered 2023-09-25: 12.5 ug via INTRAVENOUS
  Filled 2023-09-25: qty 1

## 2023-09-25 MED ORDER — OXYCODONE HCL 5 MG PO TABS
20.0000 mg | ORAL_TABLET | Freq: Four times a day (QID) | ORAL | Status: DC | PRN
  Administered 2023-09-25 – 2023-10-13 (×55): 20 mg via ORAL
  Filled 2023-09-25 (×56): qty 4

## 2023-09-25 MED ORDER — PERFLUTREN LIPID MICROSPHERE
1.0000 mL | INTRAVENOUS | Status: AC | PRN
  Administered 2023-09-25: 2 mL via INTRAVENOUS

## 2023-09-25 MED ORDER — PROCHLORPERAZINE EDISYLATE 10 MG/2ML IJ SOLN
10.0000 mg | Freq: Four times a day (QID) | INTRAMUSCULAR | Status: DC | PRN
  Administered 2023-09-25 – 2023-09-30 (×13): 10 mg via INTRAVENOUS
  Filled 2023-09-25 (×15): qty 2

## 2023-09-25 MED ORDER — NITROGLYCERIN IN D5W 200-5 MCG/ML-% IV SOLN
5.0000 ug/min | INTRAVENOUS | Status: DC
  Administered 2023-09-25: 5 ug/min via INTRAVENOUS
  Filled 2023-09-25: qty 250

## 2023-09-25 NOTE — Plan of Care (Signed)
  Problem: Education: Goal: Ability to describe self-care measures that may prevent or decrease complications (Diabetes Survival Skills Education) will improve Outcome: Progressing Goal: Individualized Educational Video(s) Outcome: Progressing   Problem: Coping: Goal: Ability to adjust to condition or change in health will improve Outcome: Progressing   Problem: Health Behavior/Discharge Planning: Goal: Ability to identify and utilize available resources and services will improve Outcome: Progressing Goal: Ability to manage health-related needs will improve Outcome: Progressing   Problem: Metabolic: Goal: Ability to maintain appropriate glucose levels will improve Outcome: Progressing   Problem: Skin Integrity: Goal: Risk for impaired skin integrity will decrease Outcome: Progressing   Problem: Tissue Perfusion: Goal: Adequacy of tissue perfusion will improve Outcome: Progressing   Problem: Education: Goal: Knowledge of General Education information will improve Description: Including pain rating scale, medication(s)/side effects and non-pharmacologic comfort measures Outcome: Progressing   Problem: Health Behavior/Discharge Planning: Goal: Ability to manage health-related needs will improve Outcome: Progressing   Problem: Clinical Measurements: Goal: Ability to maintain clinical measurements within normal limits will improve Outcome: Progressing Goal: Will remain free from infection Outcome: Progressing Goal: Diagnostic test results will improve Outcome: Progressing Goal: Respiratory complications will improve Outcome: Progressing Goal: Cardiovascular complication will be avoided Outcome: Progressing   Problem: Coping: Goal: Level of anxiety will decrease Outcome: Progressing   Problem: Elimination: Goal: Will not experience complications related to bowel motility Outcome: Progressing Goal: Will not experience complications related to urinary  retention Outcome: Progressing   Problem: Pain Managment: Goal: General experience of comfort will improve and/or be controlled Outcome: Progressing   Problem: Safety: Goal: Ability to remain free from injury will improve Outcome: Progressing   Problem: Skin Integrity: Goal: Risk for impaired skin integrity will decrease Outcome: Progressing

## 2023-09-25 NOTE — Progress Notes (Signed)
 PROGRESS NOTE  Karen Bennett ZOX:[id number] DOB: [birth year] DOA: 09/22/2023 PCP: [name], NP   LOS: 3 days   Brief Narrative / Interim history: 48-year-old female with history of morbid obesity, chronic systolic CHF, HTN, HLD, IDDM, hypothyroidism, prior DVT/PE on Eliquis, history of Candida mitral valve endocarditis in 2022, pulmonary hypertension, chronic "pulmonary, CKD 3B, right BKA, prior CVA, who comes into the hospital with worsening abdominal distention, lower extremity swelling and shortness of breath.  She was recently hospitalized and discharged in January 2025 for acute on chronic systolic CHF, required significant diuresis with midodrine augmentation and seen by advanced heart failure service.  Since discharge she tells me she has been doing well, compliant with her home medications as well as a low-sodium diet, but over the last several days-a week she has noticed increased swelling, decreased urination, and worsening shortness of breath.  She is unsure whether she gained any weight that she cannot weigh herself.  In the ED she was found to have acute kidney injury, hyperkalemia, and imaging and exam was consistent with fluid overload.  Cardiology was consulted, she was given Lasix and was admitted to the hospital   Subjective / 24h Interval events: Tells me that she started to urinate a little more.  Developed chest pain last night.  Complains of leg pain which is chronic but now worsened due to fluid overload  Assesement and Plan: Principal Problem:   Acute on chronic HFrEF (heart failure with reduced ejection fraction) (HCC) Active Problems:   Acute kidney injury superimposed on stage 3a chronic kidney disease (HCC)   History of pulmonary embolism   Insulin dependent type 2 diabetes mellitus (HCC)   Hyperlipidemia associated with type 2 diabetes mellitus (HCC)   Hypothyroidism  Principal problem Acute on chronic combined CHF, biventricular heart failure -patient was  admitted to the hospital for significant fluid overload.  Cardiology consulted and following.  She was previously hospitalized in January 2025 for the same thing and required milrinone -Have had significant difficulties with obtaining central access given size, and ability to lay flat as well as known right-sided subclavian stenosis.  After discussing with the PICC line team, PCCM, IR, she eventually had a left-sided PICC placed last night by the PICC team -Continue milrinone, Lasix drip, monitor Coox -Due to significant difficulties with central access, when she is volume optimized she may benefit from a port prior to discharge.  This was discussed with the patient this morning and she is in agreement given significant difficulties that she encountered in January as well.  Will contact IR for port placement 1 to 2 days prior to discharge  Active problems Chest pain, coronary artery calcifications -cardiology considering cardiac catheterization once volume optimized.  She had chest pain last night, started on nitroglycerin infusion, continue  Acute kidney injury on chronic kidney disease stage IIIb-baseline creatinine around 2.2 in January, now increased to 3.7, overall stable and improving with diuresis.  Continue to monitor  Hyperkalemia-due to AKI, potassium improving while receiving Lasix.  Monitor  Hyperphosphatemia-due to CKD  Hyponatremia-hypervolemic, continue diuresis  Anemia of chronic kidney disease-hemoglobin 10.0, overall stable, no bleeding  Type 2 diabetes mellitus, poorly controlled-continue glargine, sliding scale  Lab Results  Component Value Date   HGBA1C 7.2 (H) 08/01/2023   CBG (last 3)  Recent Labs    09/24/23 1644 09/24/23 2137 09/25/23 0810  GLUCAP 81 79 70    Hypothyroidism-continue Synthroid.  TSH normal  Hyperlipidemia, history of CVA-continue statin  History of PE-continue  Eliquis  Obesity, morbid-BMI 71.  She would benefit from weight  loss  History of right BKA-noted  Scheduled Meds:  apixaban  5 mg Oral BID   atorvastatin  40 mg Oral QPM   Chlorhexidine Gluconate Cloth  6 each Topical Daily   ezetimibe  10 mg Oral Daily   insulin aspart  0-15 Units Subcutaneous TID WC   insulin aspart  0-5 Units Subcutaneous QHS   insulin glargine-yfgn  10 Units Subcutaneous QHS   levothyroxine  75 mcg Oral QAC breakfast   mexiletine  250 mg Oral Q12H   mupirocin ointment  1 Application Nasal BID   sodium chloride flush  10-40 mL Intracatheter Q12H   Continuous Infusions:  furosemide (LASIX) 200 mg in dextrose 5 % 100 mL (2 mg/mL) infusion 8 mg/hr (09/25/23 1035)   milrinone 0.25 mcg/kg/min (09/25/23 1035)   nitroGLYCERIN 5 mcg/min (09/25/23 1035)   promethazine (PHENERGAN) injection (IM or IVPB) Stopped (09/25/23 0907)   PRN Meds:.acetaminophen **OR** acetaminophen, hydrOXYzine, mouth rinse, oxyCODONE, promethazine (PHENERGAN) injection (IM or IVPB), sodium chloride flush  Current Outpatient Medications  Medication Instructions   apixaban (ELIQUIS) 5 mg, Oral, 2 times daily   atorvastatin (LIPITOR) 80 mg, Oral, Daily-1800   atorvastatin (LIPITOR) 40 mg, Oral, Every evening   Continuous Glucose Sensor (FREESTYLE LIBRE 3 SENSOR) MISC Place 1 sensor on the skin every 14 days. Use to check glucose continuously   ezetimibe (ZETIA) 10 mg, Oral, Daily   ezetimibe (ZETIA) 10 mg, Oral, Daily   glucose blood test strip Use as instructed   glucose monitoring kit (FREESTYLE) monitoring kit 1 each, Does not apply, As needed   hydrOXYzine (VISTARIL) 50 mg, 2 times daily PRN   insulin lispro (HUMALOG) 1-6 Units, See admin instructions   Jardiance 25 mg, Oral, Daily   Lantus SoloStar 10 Units, Daily at bedtime   levothyroxine (SYNTHROID) 75 mcg, Daily before breakfast   methocarbamol (ROBAXIN) 750 mg, Every 8 hours PRN   mexiletine (MEXITIL) 250 mg, Oral, Every 12 hours   minoxidil (LONITEN) 5 mg, Oral, Daily   Mounjaro 2.5 mg, Every  Wed   oxyCODONE (ROXICODONE) 15 mg, Oral, Every 6 hours PRN   potassium chloride (KLOR-CON) 10 MEQ tablet 10 mEq, Oral, 2 times daily   promethazine (PHENERGAN) 25 mg, Every 6 hours PRN   spironolactone (ALDACTONE) 12.5 mg, Oral, Daily   spironolactone (ALDACTONE) 12.5 mg, Oral, Daily   torsemide (DEMADEX) 80 mg, Oral, 2 times daily   torsemide (DEMADEX) 80 mg, Oral, Daily   traZODone (DESYREL) 100 mg, Oral, Daily at bedtime   venlafaxine XR (EFFEXOR-XR) 75 mg, Oral, Daily with breakfast    Diet Orders (From admission, onward)     Start     Ordered   09/22/23 0643  Diet heart healthy/carb modified Room service appropriate? Yes; Fluid consistency: Thin; Fluid restriction: 1200 mL Fluid  Diet effective now       Question Answer Comment  Diet-HS Snack? Nothing   Room service appropriate? Yes   Fluid consistency: Thin   Fluid restriction: 1200 mL Fluid      09/22/23 0645            DVT prophylaxis:  apixaban (ELIQUIS) tablet 5 mg   Lab Results  Component Value Date   PLT 198 09/25/2023      Code Status: Full Code  Family Communication: No family at bedside  Status is: Inpatient Remains inpatient appropriate because: Severity of illness   Level of care: Stepdown  Consultants:  Cardiology  Objective: Vitals:   09/25/23 0800 09/25/23 0809 09/25/23 0900 09/25/23 1000  BP: 128/76  (!) 130/56 (!) 144/64  Pulse: 88  90 89  Resp: 19  (!) 21 16  Temp:  97.9 F (36.6 C)    TempSrc:  Oral    SpO2: 92%  (!) 89% 91%  Weight:      Height:        Intake/Output Summary (Last 24 hours) at 09/25/2023 1057 Last data filed at 09/25/2023 1035 Gross per 24 hour  Intake 1532.24 ml  Output 900 ml  Net 632.24 ml   Wt Readings from Last 3 Encounters:  09/25/23 (!) 199.5 kg  08/21/23 (!) 167.8 kg  03/21/23 (!) 164 kg    Examination:  Constitutional: NAD Eyes: lids and conjunctivae normal, no scleral icterus ENMT: mmm Neck: normal, supple Respiratory: clear to  auscultation bilaterally, no wheezing, no crackles.  Difficult exam due to body habitus Cardiovascular: Regular rate and rhythm, no murmurs / rubs / gallops.  Lower extremity edema. Abdomen: soft, no distention, no tenderness. Bowel sounds positive.    Data Reviewed: I have independently reviewed following labs and imaging studies   CBC Recent Labs  Lab 09/22/23 0215 09/22/23 0220 09/23/23 0534 09/24/23 0535 09/25/23 0600  WBC 5.6  --  4.4 5.3 5.5  HGB 11.0* 12.2 11.4* 11.2* 10.0*  HCT 35.9* 36.0 38.1 36.8 32.5*  PLT 248  --  225 234 198  MCV 87.6  --  89.6 89.3 89.0  MCH 26.8  --  26.8 27.2 27.4  MCHC 30.6  --  29.9* 30.4 30.8  RDW 14.4  --  14.5 14.3 14.1  LYMPHSABS 1.3  --   --   --   --   MONOABS 0.5  --   --   --   --   EOSABS 0.1  --   --   --   --   BASOSABS 0.0  --   --   --   --     Recent Labs  Lab 09/22/23 0215 09/22/23 0220 09/22/23 0656 09/22/23 0823 09/22/23 1607 09/23/23 0534 09/24/23 0535 09/25/23 0600  NA  --  130* 131*  --   --  131* 131* 127*  K  --  5.9* 5.0  --   --  5.2* 5.1 4.4  CL  --  96* 94*  --   --  96* 96* 93*  CO2  --   --  28  --   --  25 24 22   GLUCOSE  --  135* 133*  --   --  84 99 156*  BUN  --  73* 73*  --   --  79* 78* 76*  CREATININE  --  3.70* 3.29*  --   --  3.23* 3.35* 3.26*  CALCIUM  --   --  8.7*  --   --  8.7* 8.5* 8.1*  AST  --   --  9*  --   --  11* 8*  --   ALT  --   --  9  --   --  8 8  --   ALKPHOS  --   --  63  --   --  67 70  --   BILITOT  --   --  0.9  --   --  0.9 1.0  --   ALBUMIN  --   --  2.9*  --   --  2.8* 2.8*  --  MG  --   --  2.2  --   --  2.4 2.4 2.3  LATICACIDVEN  --   --   --  0.7  --   --   --   --   TSH  --   --   --   --  4.243  --   --   --   BNP 572.3*  --   --   --   --   --   --   --     ------------------------------------------------------------------------------------------------------------------ No results for input(s): "CHOL", "HDL", "LDLCALC", "TRIG", "CHOLHDL", "LDLDIRECT" in the  last 72 hours.  Lab Results  Component Value Date   HGBA1C 7.2 (H) 08/01/2023   ------------------------------------------------------------------------------------------------------------------ Recent Labs    09/22/23 1607  TSH 4.243    Cardiac Enzymes No results for input(s): "CKMB", "TROPONINI", "MYOGLOBIN" in the last 168 hours.  Invalid input(s): "CK" ------------------------------------------------------------------------------------------------------------------    Component Value Date/Time   BNP 572.3 (H) 09/22/2023 0215    CBG: Recent Labs  Lab 09/24/23 0758 09/24/23 1217 09/24/23 1644 09/24/23 2137 09/25/23 0810  GLUCAP 107* 119* 81 79 70    Recent Results (from the past 240 hours)  MRSA Next Gen by PCR, Nasal     Status: Abnormal   Collection Time: 09/22/23 10:06 PM   Specimen: Nasal Mucosa; Nasal Swab  Result Value Ref Range Status   MRSA by PCR Next Gen DETECTED (A) NOT DETECTED Final    Comment: RESULT CALLED TO, READ BACK BY AND VERIFIED WITH: SIMON,K AT 0429 ON 09/23/23 BY LUZOLOP (NOTE) The GeneXpert MRSA Assay (FDA approved for NASAL specimens only), is one component of a comprehensive MRSA colonization surveillance program. It is not intended to diagnose MRSA infection nor to guide or monitor treatment for MRSA infections. Test performance is not FDA approved in patients less than 14 years old. Performed at [facility], [address] [place], [place] [address]   MRSA Next Gen by PCR, Nasal     Status: None   Collection Time: 09/24/23  4:49 PM   Specimen: Nasal Mucosa; Nasal Swab  Result Value Ref Range Status   MRSA by PCR Next Gen NOT DETECTED NOT DETECTED Final    Comment: (NOTE) The GeneXpert MRSA Assay (FDA approved for NASAL specimens only), is one component of a comprehensive MRSA colonization surveillance program. It is not intended to diagnose MRSA infection nor to guide or monitor treatment for MRSA  infections. Test performance is not FDA approved in patients less than 81 years old. Performed at [facility], [address], [place], [place] [address]      Radiology Studies: [place] EKG SITE RITE Result Date: 09/24/2023 If Site Rite image not attached, placement could not be confirmed due to current cardiac rhythm.  IR Fluoro Guide CV Line Right Result Date: 09/24/2023 INDICATION: ESRD.  Poor IV access. EXAM: * ABORTED * ULTRASOUND AND FLUOROSCOPIC GUIDED PLACEMENT OF TUNNELED CENTRAL VENOUS CATHETER MEDICATIONS: None. ANESTHESIA/SEDATION: None FLUOROSCOPY TIME:  Fluoroscopic dose; 0 mGy COMPLICATIONS: None immediate. PROCEDURE: Informed written consent was obtained from the patient after a discussion of the risks, benefits, and alternatives to treatment. Questions regarding the procedure were encouraged and answered. The RIGHT neck and chest were prepped with chlorhexidine in a sterile fashion, and a sterile drape was applied covering the operative field. Maximum barrier sterile technique with sterile gowns and gloves were used for the procedure. A timeout was performed prior to the initiation of the procedure.  Prior to anesthetic administration, the patient became increasingly uncomfortable and requested the procedure to be stopped. The sterile drape port removed. Incision was not performed. FINDINGS: Preprocedural ultrasound with a widely patent RIGHT internal jugular vein. Incision was not performed IMPRESSION: Aborted placement of a tunneled central venous catheter, secondary to patient discomfort. Roanna Banning, MD [facility] Electronically Signed   By: Roanna Banning M.D.   On: 09/24/2023 14:01   The patient is critically ill with multiple organ system failure and requires high complexity decision making for assessment and support, frequent evaluation and titration of therapies, advanced monitoring, review of radiographic  studies and interpretation of complex data.    Critical Care Time devoted to patient care services, exclusive of separately billable procedures, described in this note is 35 minutes, 8 am - 8:35 am  Pamella Pert, MD, PhD [facility]  Between 7 am - 7 pm I am available, please contact me via Amion (for emergencies) or Securechat (non urgent messages)  Between 7 pm - 7 am I am not available, please contact night coverage MD/APP via Amion

## 2023-09-25 NOTE — Progress Notes (Addendum)
 Rounding Note    Patient Name: Karen Bennett Date of Encounter: 09/25/2023  [facility] Cardiologist: Chilton Si, MD   Subjective   Pt reports new chest pain last evening rated as a 8/10, intermittent throughout the evening, now 6/10. She also states she is not urinating much, purewick in place.   Inpatient Medications    Scheduled Meds:  apixaban  5 mg Oral BID   atorvastatin  40 mg Oral QPM   Chlorhexidine Gluconate Cloth  6 each Topical Daily   ezetimibe  10 mg Oral Daily   furosemide  40 mg Intravenous BID   insulin aspart  0-15 Units Subcutaneous TID WC   insulin aspart  0-5 Units Subcutaneous QHS   insulin glargine-yfgn  10 Units Subcutaneous QHS   levothyroxine  75 mcg Oral QAC breakfast   mexiletine  250 mg Oral Q12H   mupirocin ointment  1 Application Nasal BID   sodium chloride flush  10-40 mL Intracatheter Q12H   Continuous Infusions:  milrinone 0.25 mcg/kg/min (09/25/23 0600)   promethazine (PHENERGAN) injection (IM or IVPB) Stopped (09/24/23 2219)   PRN Meds: acetaminophen **OR** acetaminophen, fentaNYL (SUBLIMAZE) injection, hydrOXYzine, mouth rinse, oxyCODONE, promethazine (PHENERGAN) injection (IM or IVPB), sodium chloride flush   Vital Signs    Vitals:   09/25/23 0445 09/25/23 0500 09/25/23 0600 09/25/23 0606  BP:  (!) 133/57 (!) 127/52   Pulse:  88 90 91  Resp:  16 14 14   Temp: 98 F (36.7 C)     TempSrc: Oral     SpO2:  91% 93% 91%  Weight:  (!) 199.5 kg    Height:        Intake/Output Summary (Last 24 hours) at 09/25/2023 0736 Last data filed at 09/25/2023 0600 Gross per 24 hour  Intake 1408.68 ml  Output 900 ml  Net 508.68 ml      09/25/2023    5:00 AM 09/24/2023    4:09 PM 09/24/2023    7:00 AM  Last 3 Weights  Weight (lbs) 439 lb 13.1 oz 441 lb 12.8 oz 444 lb 3.2 oz  Weight (kg) 199.5 kg 200.4 kg 201.488 kg      Telemetry    Sinus rhythm HR 80s - Personally Reviewed  ECG    EKG last night with SR, HR 78, first  degree heart block, RBBB (old), inferior Q waves (old) - Personally Reviewed  Physical Exam   GEN: super morbid obesity, NAD Neck: No JVD - exam challenging Cardiac: RRR, 2/6 systolic murmur  Respiratory: crackles in bases R > L GI: Soft, nontender, non-distended  MS: R BKA, significant edema through abdomen Neuro:  Nonfocal  Psych: Normal affect   Labs    High Sensitivity Troponin:  No results for input(s): "TROPONINIHS" in the last 720 hours.   Chemistry Recent Labs  Lab 09/22/23 0656 09/23/23 0534 09/24/23 0535 09/25/23 0600  NA 131* 131* 131* 127*  K 5.0 5.2* 5.1 4.4  CL 94* 96* 96* 93*  CO2 28 25 24 22   GLUCOSE 133* 84 99 156*  BUN 73* 79* 78* 76*  CREATININE 3.29* 3.23* 3.35* 3.26*  CALCIUM 8.7* 8.7* 8.5* 8.1*  MG 2.2 2.4 2.4 2.3  PROT 7.4 7.3 7.3  --   ALBUMIN 2.9* 2.8* 2.8*  --   AST 9* 11* 8*  --   ALT 9 8 8   --   ALKPHOS 63 67 70  --   BILITOT 0.9 0.9 1.0  --   [facility]  17* 17* 16* 17*  ANIONGAP 9 10 11 12     Lipids No results for input(s): "CHOL", "TRIG", "HDL", "LABVLDL", "LDLCALC", "CHOLHDL" in the last 168 hours.  Hematology Recent Labs  Lab 09/23/23 0534 09/24/23 0535 09/25/23 0600  WBC 4.4 5.3 5.5  RBC 4.25 4.12 3.65*  HGB 11.4* 11.2* 10.0*  HCT 38.1 36.8 32.5*  MCV 89.6 89.3 89.0  MCH 26.8 27.2 27.4  MCHC 29.9* 30.4 30.8  RDW 14.5 14.3 14.1  PLT 225 234 198   Thyroid  Recent Labs  Lab 09/22/23 1607  TSH 4.243    BNP Recent Labs  Lab 09/22/23 0215  BNP 572.3*    DDimer No results for input(s): "DDIMER" in the last 168 hours.   Radiology    [place] EKG SITE RITE Result Date: 09/24/2023 If Site Rite image not attached, placement could not be confirmed due to current cardiac rhythm.  IR Fluoro Guide CV Line Right Result Date: 09/24/2023 INDICATION: ESRD.  Poor IV access. EXAM: * ABORTED * ULTRASOUND AND FLUOROSCOPIC GUIDED PLACEMENT OF TUNNELED CENTRAL VENOUS CATHETER MEDICATIONS: None. ANESTHESIA/SEDATION: None FLUOROSCOPY TIME:   Fluoroscopic dose; 0 mGy COMPLICATIONS: None immediate. PROCEDURE: Informed written consent was obtained from the patient after a discussion of the risks, benefits, and alternatives to treatment. Questions regarding the procedure were encouraged and answered. The RIGHT neck and chest were prepped with chlorhexidine in a sterile fashion, and a sterile drape was applied covering the operative field. Maximum barrier sterile technique with sterile gowns and gloves were used for the procedure. A timeout was performed prior to the initiation of the procedure. Prior to anesthetic administration, the patient became increasingly uncomfortable and requested the procedure to be stopped. The sterile drape port removed. Incision was not performed. FINDINGS: Preprocedural ultrasound with a widely patent RIGHT internal jugular vein. Incision was not performed IMPRESSION: Aborted placement of a tunneled central venous catheter, secondary to patient discomfort. Roanna Banning, MD [facility] Radiology Electronically Signed   By: Roanna Banning M.D.   On: 09/24/2023 14:01    Cardiac Studies   Echo 08/02/23: 1. No apical thrombus with Definity. Left ventricular ejection fraction,  by estimation, is 35 to 40%. Left ventricular ejection fraction by 2D MOD  biplane is 40.8 %. The left ventricle has moderately decreased function.  The left ventricle demonstrates  regional wall motion abnormalities (see scoring diagram/findings for  description). The left ventricular internal cavity size was mildly  dilated. Left ventricular diastolic parameters are consistent with Grade  III diastolic dysfunction (restrictive).  Elevated left ventricular end-diastolic pressure. There is abnormal  (paradoxical) septal motion, consistent with right ventricular volume  overload. There is severe akinesis of the left ventricular, mid inferior  wall and inferolateral wall.   2. Right ventricular  systolic function is low normal. The right  ventricular size is normal. There is severely elevated pulmonary artery  systolic pressure. The estimated right ventricular systolic pressure is  69.5 mmHg.   3. Left atrial size was moderately dilated.   4. Right atrial size was severely dilated.   5. The mitral valve is abnormal. Mild mitral valve regurgitation.   6. The tricuspid valve is abnormal. Tricuspid valve regurgitation is  moderate.   7. The aortic valve is tricuspid. Aortic valve regurgitation is not  visualized. No aortic stenosis is present.   8. The inferior vena cava is dilated in size with <50% respiratory  variability, suggesting right atrial pressure of 15 mmHg.   Patient Profile  48 y.o. 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    Last admission for CHF in Jan 2025 required milrinone for diuresis.  Assessment & Plan    Acute on chronic biventricular failure Acute on chronic renal failure Hypoalbuminemia She has required milrinone to help with diuresis (as recently as Jan 2025) - discharged on 80 mg torsemide BID and 12.5 mg spironolactone. Admission weight in Jan 2025 was 479 lbs --> 370 lbs at discharge - admitted with recurrent heart failure exacerbation - baseline BNP felt 200-300 range - we attempted to diurese with IV lasix, but renal function continues to deteriorate - PICC was placed yesterday and milrinone started - remains on 40 mg IV lasix - sCr 3.26 (3.35), K 4.4 - telemetry reviewed, no sustained ventricular arrhythmias on milrinone - coox with O2 75.7, CO 2.5 - urine output 900 cc overnight, will likely need higher doses  of lasix - increase to 80 mg IV lasix BID vs gtt   Chest pain - she reports right sided chest pain that started last night rated as a 8/10 - new for her - EKG did not appear changed from prior tracings - she received fentanyl x 2 which relieved her pain - very difficult to determine if this is angina - not currently a candidate for invasive ischemic evaluation or nuclear stress testing - will continue to monitor, attempt to add imdur if pressure room with ongoing diuresis   PVCs - on mexiletine Hx of PE/DVT On chronic eliquis Super morbid obesity CKD stage 3 -baseline felt near 1.7 Right BKA Hx of candida albicans bacteremia and MV endocarditis OSA - refuses CPAP Tobacco abuse - have discussed cessation      For questions or updates, please contact [facility] Please consult [email or web address] for contact info under        Signed, Marcelino Duster, PA  09/25/2023, 7:36 AM    I have seen and examined the patient along with Marcelino Duster, PA .  I have reviewed the chart, notes and new data.  I agree with PA/NP's note.  Key new complaints: she has ongoing constant chest discomfort Key examination changes: severe bilateral leg edema, diminished breath sounds, RRR, cannot see her JVP Key new findings / data: Coox 75% (on IV milrinone), creat unchanged 3.3, worsened hyponatremia 127. The rhythm is sinus 90-100 and paradoxically the burden of PVCs is much lower after starting milrinone. Chest CT angio from last year does show substantial coronary atherosclerotic calcifications, especially in the LAD. ECG shows no new ischemic changes. Echo shows possible regional wall motion abnormalities.  PLAN: Chst pain due to CAD or PAH? Start IV NTG for HF and to see if it helps her chest discomfort. Start IV furosemide infusion, now that we have good cardiac output. Fluid restriction. Repeat her echo. On last admission we were considering right and left heart cath after her weight  was low enough for the cath table. Cath was not performed.   CRITICAL CARE Performed by: Rachelle Hora Loxley Cibrian   Total critical care time: 40 minutes  Critical care time was exclusive of separately billable procedures and treating other patients.  Critical care was necessary to treat or prevent imminent or life-threatening  deterioration.  Critical care was time spent personally by me on the following activities: development of treatment plan with patient and/or surrogate as well as nursing, discussions with consultants, evaluation of patient's response to treatment, examination of patient, obtaining history from patient or surrogate, ordering and performing treatments and interventions, ordering and review of laboratory studies, ordering and review of radiographic studies, pulse oximetry and re-evaluation of patient's condition.    Thurmon Fair, MD, [facility] [phone] 09/25/2023, 9:59 AM

## 2023-09-26 DIAGNOSIS — I5023 Acute on chronic systolic (congestive) heart failure: Secondary | ICD-10-CM | POA: Diagnosis not present

## 2023-09-26 LAB — COOXEMETRY PANEL
Carboxyhemoglobin: 2.2 % — ABNORMAL HIGH (ref 0.5–1.5)
Methemoglobin: <0.7 % (ref 0.0–1.5)
O2 Saturation: 71.8 %
Total hemoglobin: 10.4 g/dL — ABNORMAL LOW (ref 12.0–16.0)

## 2023-09-26 LAB — COMPREHENSIVE METABOLIC PANEL
ALT: 8 U/L (ref 0–44)
AST: 9 U/L — ABNORMAL LOW (ref 15–41)
Albumin: 2.7 g/dL — ABNORMAL LOW (ref 3.5–5.0)
Alkaline Phosphatase: 65 U/L (ref 38–126)
Anion gap: 10 (ref 5–15)
BUN: 76 mg/dL — ABNORMAL HIGH (ref 6–20)
CO2: 25 mmol/L (ref 22–32)
Calcium: 8.2 mg/dL — ABNORMAL LOW (ref 8.9–10.3)
Chloride: 94 mmol/L — ABNORMAL LOW (ref 98–111)
Creatinine, Ser: 3.16 mg/dL — ABNORMAL HIGH (ref 0.44–1.00)
GFR, Estimated: 18 mL/min — ABNORMAL LOW (ref >60)
Glucose, Bld: 80 mg/dL (ref 70–99)
Potassium: 4 mmol/L (ref 3.5–5.1)
Sodium: 129 mmol/L — ABNORMAL LOW (ref 135–145)
Total Bilirubin: 1.1 mg/dL (ref 0.0–1.2)
Total Protein: 6.8 g/dL (ref 6.5–8.1)

## 2023-09-26 LAB — CBC
HCT: 33.2 % — ABNORMAL LOW (ref 36.0–46.0)
Hemoglobin: 10 g/dL — ABNORMAL LOW (ref 12.0–15.0)
MCH: 27.1 pg (ref 26.0–34.0)
MCHC: 30.1 g/dL (ref 30.0–36.0)
MCV: 90 fL (ref 80.0–100.0)
Platelets: 188 10*3/uL (ref 150–400)
RBC: 3.69 MIL/uL — ABNORMAL LOW (ref 3.87–5.11)
RDW: 14 % (ref 11.5–15.5)
WBC: 3.8 10*3/uL — ABNORMAL LOW (ref 4.0–10.5)
nRBC: 0 % (ref 0.0–0.2)

## 2023-09-26 LAB — GLUCOSE, CAPILLARY
Glucose-Capillary: 72 mg/dL (ref 70–99)
Glucose-Capillary: 73 mg/dL (ref 70–99)
Glucose-Capillary: 58 mg/dL — ABNORMAL LOW (ref 70–99)
Glucose-Capillary: 76 mg/dL (ref 70–99)
Glucose-Capillary: 87 mg/dL (ref 70–99)

## 2023-09-26 LAB — PHOSPHORUS: Phosphorus: 7.1 mg/dL — ABNORMAL HIGH (ref 2.5–4.6)

## 2023-09-26 LAB — MAGNESIUM: Magnesium: 2.2 mg/dL (ref 1.7–2.4)

## 2023-09-26 MED ORDER — METOLAZONE 5 MG PO TABS
5.0000 mg | ORAL_TABLET | Freq: Once | ORAL | Status: AC
  Administered 2023-09-26: 5 mg via ORAL
  Filled 2023-09-26: qty 1

## 2023-09-26 MED ORDER — CLOTRIMAZOLE 1 % EX SOLN
Freq: Two times a day (BID) | CUTANEOUS | Status: DC
Start: 2023-09-26 — End: 2023-09-27
  Filled 2023-09-26: qty 1
  Filled 2023-09-26 (×2): qty 10

## 2023-09-26 NOTE — Progress Notes (Signed)
 Heart Failure Navigator Progress Note  Assessed for Heart & Vascular TOC clinic readiness.  Patient does not meet criteria due to Advanced Heart Failure Team patient. Has a AHF clinic appointment on 10/11/2023 @ 2 pm. .   Navigator will sign off at this time.   Rhae Hammock, BSN, Scientist, clinical (histocompatibility and immunogenetics) Only

## 2023-09-26 NOTE — Progress Notes (Signed)
 PROGRESS NOTE    Karen Bennett  ONG:[id number] DOB: 1976-01-10 DOA: 09/22/2023 PCP: [name], NP   Brief Narrative:  48 year old female with history of morbid obesity, chronic systolic CHF, HTN, HLD, IDDM, hypothyroidism, prior DVT/PE on Eliquis, history of Candida mitral valve endocarditis in 2022, pulmonary hypertension, chronic "pulmonary, CKD 3B, right BKA, prior CVA, who comes into the hospital with worsening abdominal distention, lower extremity swelling and shortness of breath.  She was recently hospitalized and discharged in January 2025 for acute on chronic systolic CHF, required significant diuresis with midodrine augmentation and seen by advanced heart failure service.  Since discharge she tells me she has been doing well, compliant with her home medications as well as a low-sodium diet, but over the last several days-a week she has noticed increased swelling, decreased urination, and worsening shortness of breath.  She is unsure whether she gained any weight that she cannot weigh herself.  In the ED she was found to have acute kidney injury, hyperkalemia, and imaging and exam was consistent with fluid overload.    Hospitalist called for admission, cardiology was consulted, she was given Lasix and was admitted to the hospital.  Hospitalization complicated by poor response to medical therapy.  Assessment & Plan:   Principal Problem:   Acute on chronic HFrEF (heart failure with reduced ejection fraction) (HCC) Active Problems:   Acute kidney injury superimposed on stage 3a chronic kidney disease (HCC)   History of pulmonary embolism   Insulin dependent type 2 diabetes mellitus (HCC)   Hyperlipidemia associated with type 2 diabetes mellitus (HCC)   Hypothyroidism   Acute on chronic combined CHF, biventricular heart failure  -Recurrent issues, admitted just last month for similar episode -Cardiology consulted, currently requiring IV Lasix, milrinone and  nitroglycerin -Likely to require left and right heart cath per prior documentation -Continue diuretics per cardiology, I's and O's continue to be negative  Chest pain with known coronary artery calcifications  -Cardiology considering catheterization as above, patient reports she was previously being considered for cardiac cath in January  -Will transfer patient to main campus for close follow-up with heart failure team as well as to decrease logistical burden for heart cath -Patient initiated on nitro drip today for ongoing chest pain, chest pain resolved immediately and has not returned since nitro had been administered.  Unfortunately patient's blood pressure this afternoon appears to be borderline low, hold nitro drip per cardiology recommendations.  Acute kidney injury on chronic kidney disease stage IIIb -Baseline creatinine 2.2, peak  3.7 at intake, downtrending appropriately -Avoid nephrotoxics, IV fluids on hold in the setting of volume overload as above   Vasculopath Difficult access -No difficult central Have had significant difficulties with obtaining central access given size, and ability to lay flat as well as known right-sided subclavian stenosis.  After discussing with the PICC line team, PCCM, IR, she eventually had a left-sided PICC placed by PICC team -Due to significant difficulties with central access, when she is volume optimized she may benefit from a port prior to discharge.  This was discussed with the patient this morning and she is in agreement given significant difficulties that she encountered in January as well.  Will contact IR for port placement 1 to 2 days prior to discharge  Hyperkalemia-due to AKI, potassium improving while receiving Lasix.  Monitor   Hyperphosphatemia-due to CKD   Hyponatremia-hypervolemic, continue diuresis   Anemia of chronic kidney disease-hemoglobin 10.0, overall stable, no bleeding   Type 2 diabetes mellitus, poorly  controlled-continue glargine, sliding scale  DVT prophylaxis:  apixaban (ELIQUIS) tablet 5 mg   Code Status:   Code Status: Full Code  Family Communication: None present  Status is: Inpatient  Dispo: The patient is from: Home              Anticipated d/c is to: To be determined              Anticipated d/c date is: To be determined              Patient currently not medically stable for discharge  Consultants:  Cardiology  Procedures:  None  Antimicrobials:  None  Subjective: No acute issues or events overnight  Objective: Vitals:   09/26/23 0400 09/26/23 0430 09/26/23 0500 09/26/23 0631  BP: (!) 111/52 120/66 133/72   Pulse: 95 95 96   Resp: 12 12 14    Temp:    98.7 F (37.1 C)  TempSrc:    Oral  SpO2: 96% 95% 93%   Weight:      Height:        Intake/Output Summary (Last 24 hours) at 09/26/2023 0710 Last data filed at 09/26/2023 0542 Gross per 24 hour  Intake 1033.34 ml  Output 1600 ml  Net -566.66 ml   Filed Weights   09/24/23 1609 09/25/23 0500 09/26/23 0336  Weight: (!) 200.4 kg (!) 199.5 kg (!) 198.4 kg    Examination:  General:  Pleasantly resting in bed, No acute distress. HEENT:  Normocephalic atraumatic.  Sclerae nonicteric, noninjected.  Extraocular movements intact bilaterally. Neck:  Without mass or deformity.  Trachea is midline. Lungs:  Clear to auscultate bilaterally without rhonchi, wheeze, or rales. Heart:  Regular rate and rhythm.  Without murmurs, rubs, or gallops. Abdomen:  Soft, morbidly obese nontender, nondistended.  Without guarding or rebound. Extremities: Without cyanosis, clubbing, 2+ pitting edema diffusely Skin:  Warm and dry, no erythema.  Data Reviewed: I have personally reviewed following labs and imaging studies  CBC: Recent Labs  Lab 09/22/23 0215 09/22/23 0220 09/23/23 0534 09/24/23 0535 09/25/23 0600 09/26/23 0505  WBC 5.6  --  4.4 5.3 5.5 3.8*  NEUTROABS 3.7  --   --   --   --   --   HGB 11.0* 12.2 11.4*  11.2* 10.0* 10.0*  HCT 35.9* 36.0 38.1 36.8 32.5* 33.2*  MCV 87.6  --  89.6 89.3 89.0 90.0  PLT 248  --  225 234 198 188   Basic Metabolic Panel: Recent Labs  Lab 09/22/23 0656 09/23/23 0534 09/24/23 0535 09/25/23 0600 09/26/23 0505  NA 131* 131* 131* 127* 129*  K 5.0 5.2* 5.1 4.4 4.0  CL 94* 96* 96* 93* 94*  CO2 28 25 24 22 25   GLUCOSE 133* 84 99 156* 80  BUN 73* 79* 78* 76* 76*  CREATININE 3.29* 3.23* 3.35* 3.26* 3.16*  CALCIUM 8.7* 8.7* 8.5* 8.1* 8.2*  MG 2.2 2.4 2.4 2.3 2.2  PHOS  --  7.6*  --   --  7.1*   GFR: Estimated Creatinine Clearance: 40.4 mL/min (A) (by C-G formula based on SCr of 3.16 mg/dL (H)).  Liver Function Tests: Recent Labs  Lab 09/22/23 0656 09/23/23 0534 09/24/23 0535 09/26/23 0505  AST 9* 11* 8* 9*  ALT 9 8 8 8   ALKPHOS 63 67 70 65  BILITOT 0.9 0.9 1.0 1.1  PROT 7.4 7.3 7.3 6.8  ALBUMIN 2.9* 2.8* 2.8* 2.7*   CBG: Recent Labs  Lab 09/24/23 2137 09/25/23 0810 09/25/23  1123 09/25/23 1612 09/25/23 2112  GLUCAP 79 70 81 81 80   Sepsis Labs: Recent Labs  Lab 09/22/23 0823  LATICACIDVEN 0.7    Recent Results (from the past 240 hours)  MRSA Next Gen by PCR, Nasal     Status: Abnormal   Collection Time: 09/22/23 10:06 PM   Specimen: Nasal Mucosa; Nasal Swab  Result Value Ref Range Status   MRSA by PCR Next Gen DETECTED (A) NOT DETECTED Final    Comment: RESULT CALLED TO, READ BACK BY AND VERIFIED WITH: SIMON,K AT 0429 ON 09/23/23 BY LUZOLOP (NOTE) The GeneXpert MRSA Assay (FDA approved for NASAL specimens only), is one component of a comprehensive MRSA colonization surveillance program. It is not intended to diagnose MRSA infection nor to guide or monitor treatment for MRSA infections. Test performance is not FDA approved in patients less than 39 years old. Performed at [facility], [address], [place], [place] [address]   MRSA Next Gen by PCR, Nasal     Status: None   Collection Time: 09/24/23  4:49 PM    Specimen: Nasal Mucosa; Nasal Swab  Result Value Ref Range Status   MRSA by PCR Next Gen NOT DETECTED NOT DETECTED Final    Comment: (NOTE) The GeneXpert MRSA Assay (FDA approved for NASAL specimens only), is one component of a comprehensive MRSA colonization surveillance program. It is not intended to diagnose MRSA infection nor to guide or monitor treatment for MRSA infections. Test performance is not FDA approved in patients less than 42 years old. Performed at [facility] [address], [place], [place] [address]          Radiology Studies: ECHOCARDIOGRAM LIMITED Result Date: 09/25/2023    ECHOCARDIOGRAM LIMITED REPORT   Patient Name:   Karen Bennett Date of Exam: 09/25/2023 Medical Rec #:  [id number]       Height:       66.0 in Accession #:    [id number]      Weight:       439.8 lb Date of Birth:  [birth year]      BSA:          2.796 m Patient Age:    47 years        BP:           128/76 mmHg Patient Gender: F               HR:           88 bpm. Exam Location:  Inpatient Procedure: Limited Echo, Limited Color Doppler, Cardiac Doppler and Intracardiac            Opacification Agent (Both Spectral and Color Flow Doppler were            utilized during procedure). Indications:    Chest Pain  History:        Patient has prior history of Echocardiogram examinations, most                 recent 08/02/2023. CHF, Stroke; Risk Factors:Diabetes and Current                 Smoker.  Sonographer:    Amy Chionchio Referring Phys: [id number] MIHAI CROITORU IMPRESSIONS  1. LVEF now severely reduced 25-30%. Apical akinesis. Findings either represent stress-induced cardiomyopathy vs LAD infarction. Clinical correlation recommended. Left ventricular ejection fraction, by estimation, is 25 to 30%. The left ventricle has severely decreased function. The left ventricle demonstrates  regional wall motion abnormalities (see scoring diagram/findings for description). The left ventricular internal  cavity size was severely dilated. Indeterminate diastolic filling due to E-A fusion. There is the interventricular septum is flattened in systole, consistent with right ventricular pressure overload.  2. Right ventricular systolic function is moderately reduced. The right ventricular size is severely enlarged. There is severely elevated pulmonary artery systolic pressure. The estimated right ventricular systolic pressure is 78.2 mmHg.  3. Right atrial size was mildly dilated.  4. The mitral valve is grossly normal. Mild mitral valve regurgitation. No evidence of mitral stenosis.  5. The inferior vena cava is dilated in size with >50% respiratory variability, suggesting right atrial pressure of 8 mmHg. Comparison(s): Changes from prior study are noted. LV function severely reduced 25-30% with apical WMA. RV severely dilated with moderately reduced function. FINDINGS  Left Ventricle: LVEF now severely reduced 25-30%. Apical akinesis. Findings either represent stress-induced cardiomyopathy vs LAD infarction. Clinical correlation recommended. Left ventricular ejection fraction, by estimation, is 25 to 30%. The left ventricle has severely decreased function. The left ventricle demonstrates regional wall motion abnormalities. Definity contrast agent was given IV to delineate the left ventricular endocardial borders. The left ventricular internal cavity size was severely dilated. There is no left ventricular hypertrophy. The interventricular septum is flattened in systole, consistent with right ventricular pressure overload. Indeterminate diastolic filling due to E-A fusion.  LV Wall Scoring: The entire apex is akinetic. Right Ventricle: The right ventricular size is severely enlarged. No increase in right ventricular wall thickness. Right ventricular systolic function is moderately reduced. There is severely elevated pulmonary artery systolic pressure. The tricuspid regurgitant velocity is 4.19 m/s, and with an assumed  right atrial pressure of 8 mmHg, the estimated right ventricular systolic pressure is 78.2 mmHg. Right Atrium: Right atrial size was mildly dilated. Pericardium: There is no evidence of pericardial effusion. Mitral Valve: The mitral valve is grossly normal. Mild mitral valve regurgitation. No evidence of mitral valve stenosis. Tricuspid Valve: The tricuspid valve is grossly normal. Tricuspid valve regurgitation is mild . No evidence of tricuspid stenosis. Pulmonic Valve: The pulmonic valve was grossly normal. Pulmonic valve regurgitation is mild. No evidence of pulmonic stenosis. Aorta: The aortic root and ascending aorta are structurally normal, with no evidence of dilitation. Venous: The inferior vena cava is dilated in size with greater than 50% respiratory variability, suggesting right atrial pressure of 8 mmHg. Additional Comments: There is a small pleural effusion in the left lateral region.  LEFT VENTRICLE PLAX 2D LVIDd:         6.70 cm      Diastology LVIDs:         6.10 cm      LV e' lateral:   13.20 cm/s LV PW:         0.80 cm      LV E/e' lateral: 11.7 LV IVS:        0.70 cm LVOT diam:     2.00 cm LV SV:         59 LV SV Index:   21 LVOT Area:     3.14 cm  LV Volumes (MOD) LV vol d, MOD A2C: 271.0 ml LV vol d, MOD A4C: 239.0 ml LV vol s, MOD A2C: 150.0 ml LV vol s, MOD A4C: 131.0 ml LV SV MOD A2C:     121.0 ml LV SV MOD A4C:     239.0 ml LV SV MOD BP:      115.7 ml RIGHT VENTRICLE  IVC RV Basal diam:  5.30 cm  IVC diam: 2.10 cm RV Mid diam:    4.20 cm TAPSE (M-mode): 2.4 cm LEFT ATRIUM             Index        RIGHT ATRIUM           Index LA Vol (A2C):   62.6 ml 22.39 ml/m  RA Area:     28.60 cm LA Vol (A4C):   62.3 ml 22.28 ml/m  RA Volume:   106.00 ml 37.91 ml/m LA Biplane Vol: 68.3 ml 24.43 ml/m  AORTIC VALVE             PULMONIC VALVE LVOT Vmax:   125.00 cm/s PV Vmax:          1.34 m/s LVOT Vmean:  87.600 cm/s PV Peak grad:     7.2 mmHg LVOT VTI:    0.189 m     PR End Diast Vel: 16.81  msec  AORTA Ao Root diam: 2.90 cm Ao Asc diam:  3.30 cm MITRAL VALVE                  TRICUSPID VALVE MV Area (PHT): 4.52 cm       TR Peak grad:   70.2 mmHg MV Decel Time: 168 msec       TR Vmax:        419.00 cm/s MR Peak grad:    88.0 mmHg MR Mean grad:    56.0 mmHg    SHUNTS MR Vmax:         469.00 cm/s  Systemic VTI:  0.19 m MR Vmean:        350.0 cm/s   Systemic Diam: 2.00 cm MR PISA:         2.26 cm MR PISA Eff ROA: 19 mm MR PISA Radius:  0.60 cm MV E velocity: 155.00 cm/s MV A velocity: 75.90 cm/s MV E/A ratio:  2.04 Lennie Odor MD Electronically signed by Lennie Odor MD Signature Date/Time: 09/25/2023/2:03:09 PM    Final    [place] EKG SITE RITE Result Date: 09/24/2023 If Site Rite image not attached, placement could not be confirmed due to current cardiac rhythm.  IR Fluoro Guide CV Line Right Result Date: 09/24/2023 INDICATION: ESRD.  Poor IV access. EXAM: * ABORTED * ULTRASOUND AND FLUOROSCOPIC GUIDED PLACEMENT OF TUNNELED CENTRAL VENOUS CATHETER MEDICATIONS: None. ANESTHESIA/SEDATION: None FLUOROSCOPY TIME:  Fluoroscopic dose; 0 mGy COMPLICATIONS: None immediate. PROCEDURE: Informed written consent was obtained from the patient after a discussion of the risks, benefits, and alternatives to treatment. Questions regarding the procedure were encouraged and answered. The RIGHT neck and chest were prepped with chlorhexidine in a sterile fashion, and a sterile drape was applied covering the operative field. Maximum barrier sterile technique with sterile gowns and gloves were used for the procedure. A timeout was performed prior to the initiation of the procedure. Prior to anesthetic administration, the patient became increasingly uncomfortable and requested the procedure to be stopped. The sterile drape port removed. Incision was not performed. FINDINGS: Preprocedural ultrasound with a widely patent RIGHT internal jugular vein. Incision was not performed IMPRESSION: Aborted placement of a tunneled central  venous catheter, secondary to patient discomfort. Roanna Banning, MD [facility] Electronically Signed   By: Roanna Banning M.D.   On: 09/24/2023 14:01    Scheduled Meds:  apixaban  5 mg Oral BID   atorvastatin  40 mg Oral QPM  Chlorhexidine Gluconate Cloth  6 each Topical Daily   ezetimibe  10 mg Oral Daily   insulin aspart  0-15 Units Subcutaneous TID WC   insulin aspart  0-5 Units Subcutaneous QHS   insulin glargine-yfgn  10 Units Subcutaneous QHS   levothyroxine  75 mcg Oral QAC breakfast   mexiletine  250 mg Oral Q12H   mupirocin ointment  1 Application Nasal BID   sodium chloride flush  10-40 mL Intracatheter Q12H   Continuous Infusions:  furosemide (LASIX) 200 mg in dextrose 5 % 100 mL (2 mg/mL) infusion 8 mg/hr (09/26/23 0543)   milrinone 0.25 mcg/kg/min (09/26/23 0542)   nitroGLYCERIN 5 mcg/min (09/25/23 1603)   promethazine (PHENERGAN) injection (IM or IVPB) Stopped (09/25/23 0907)     LOS: 4 days   Time spent:  Azucena Fallen, DO [facility]  If 7PM-7AM, please contact night-coverage [email or web address]  09/26/2023, 7:10 AM

## 2023-09-26 NOTE — Plan of Care (Signed)
 Patient to be transferred to [facility] for a closer follow-up, patient handbook/guide at bedside, patient has foley catheter due to urinary retention and is on a lasix drip, also remains on a Milrinone drip, nitroglycerin gtt remains off at this time, patient has no complaints of chest pain at this time.   Problem: Education: Goal: Ability to describe self-care measures that may prevent or decrease complications (Diabetes Survival Skills Education) will improve Outcome: Progressing Goal: Individualized Educational Video(s) Outcome: Progressing   Problem: Coping: Goal: Ability to adjust to condition or change in health will improve Outcome: Progressing   Problem: Fluid Volume: Goal: Ability to maintain a balanced intake and output will improve Outcome: Progressing   Problem: Health Behavior/Discharge Planning: Goal: Ability to identify and utilize available resources and services will improve Outcome: Progressing Goal: Ability to manage health-related needs will improve Outcome: Progressing   Problem: Metabolic: Goal: Ability to maintain appropriate glucose levels will improve Outcome: Progressing   Problem: Nutritional: Goal: Maintenance of adequate nutrition will improve Outcome: Progressing Goal: Progress toward achieving an optimal weight will improve Outcome: Progressing   Problem: Skin Integrity: Goal: Risk for impaired skin integrity will decrease Outcome: Progressing   Problem: Tissue Perfusion: Goal: Adequacy of tissue perfusion will improve Outcome: Progressing   Problem: Education: Goal: Knowledge of General Education information will improve Description: Including pain rating scale, medication(s)/side effects and non-pharmacologic comfort measures Outcome: Progressing   Problem: Health Behavior/Discharge Planning: Goal: Ability to manage health-related needs will improve Outcome: Progressing   Problem: Clinical Measurements: Goal: Ability to  maintain clinical measurements within normal limits will improve Outcome: Progressing Goal: Will remain free from infection Outcome: Progressing Goal: Diagnostic test results will improve Outcome: Progressing Goal: Respiratory complications will improve Outcome: Progressing Goal: Cardiovascular complication will be avoided Outcome: Progressing   Problem: Activity: Goal: Risk for activity intolerance will decrease Outcome: Progressing   Problem: Nutrition: Goal: Adequate nutrition will be maintained Outcome: Progressing   Problem: Coping: Goal: Level of anxiety will decrease Outcome: Progressing   Problem: Elimination: Goal: Will not experience complications related to bowel motility Outcome: Progressing Goal: Will not experience complications related to urinary retention Outcome: Progressing   Problem: Pain Managment: Goal: General experience of comfort will improve and/or be controlled Outcome: Progressing   Problem: Safety: Goal: Ability to remain free from injury will improve Outcome: Progressing   Problem: Skin Integrity: Goal: Risk for impaired skin integrity will decrease Outcome: Progressing

## 2023-09-26 NOTE — Progress Notes (Signed)
 Patient refused bath and CHG twice tonight, states she just wants to rest and she had a bath and CHG wipes done earlier on the previous shift, education provided on the importance of bathing and the CHG wipes.

## 2023-09-26 NOTE — Progress Notes (Addendum)
 Rounding Note    Patient Name: ANAHITA CUA Date of Encounter: 09/26/2023  [facility] Cardiologist: Chilton Si, MD   Subjective   She continues to report intermittent CP that lasts 20 min, 3 episodes last night. Does not notice a difference with IV NTG. She continues to report urine output less than expected.  Inpatient Medications    Scheduled Meds:  apixaban  5 mg Oral BID   atorvastatin  40 mg Oral QPM   Chlorhexidine Gluconate Cloth  6 each Topical Daily   ezetimibe  10 mg Oral Daily   insulin aspart  0-15 Units Subcutaneous TID WC   insulin aspart  0-5 Units Subcutaneous QHS   insulin glargine-yfgn  10 Units Subcutaneous QHS   levothyroxine  75 mcg Oral QAC breakfast   mexiletine  250 mg Oral Q12H   mupirocin ointment  1 Application Nasal BID   sodium chloride flush  10-40 mL Intracatheter Q12H   Continuous Infusions:  furosemide (LASIX) 200 mg in dextrose 5 % 100 mL (2 mg/mL) infusion 8 mg/hr (09/26/23 0800)   milrinone 0.25 mcg/kg/min (09/26/23 0800)   nitroGLYCERIN 5 mcg/min (09/26/23 0800)   promethazine (PHENERGAN) injection (IM or IVPB) Stopped (09/25/23 0907)   PRN Meds: acetaminophen **OR** acetaminophen, hydrOXYzine, mouth rinse, oxyCODONE, prochlorperazine, promethazine (PHENERGAN) injection (IM or IVPB), sodium chloride flush   Vital Signs    Vitals:   09/26/23 0631 09/26/23 0700 09/26/23 0800 09/26/23 0900  BP:   132/62 118/63  Pulse:   96 97  Resp:   14 12  Temp: 98.7 F (37.1 C) 98.4 F (36.9 C)    TempSrc: Oral Oral    SpO2:   94% 94%  Weight:      Height:        Intake/Output Summary (Last 24 hours) at 09/26/2023 0952 Last data filed at 09/26/2023 0800 Gross per 24 hour  Intake 1329.62 ml  Output 1600 ml  Net -270.38 ml      09/26/2023    3:36 AM 09/25/2023    5:00 AM 09/24/2023    4:09 PM  Last 3 Weights  Weight (lbs) 437 lb 6.3 oz 439 lb 13.1 oz 441 lb 12.8 oz  Weight (kg) 198.4 kg 199.5 kg 200.4 kg       Telemetry    Sinus rhythm with HR 80s with PVCs - Personally Reviewed  ECG    No new tracings - Personally Reviewed  Physical Exam   GEN: super morbid obesity, No acute distress.   Neck: No JVD - exam challenging Cardiac: RRR, no murmurs, rubs, or gallops.  Respiratory: Clear to auscultation bilaterally. GI: Soft, nontender, non-distended  MS: massive edema to abdomen Neuro:  Nonfocal  Psych: Normal affect   Labs    High Sensitivity Troponin:  No results for input(s): "TROPONINIHS" in the last 720 hours.   Chemistry Recent Labs  Lab 09/23/23 0534 09/24/23 0535 09/25/23 0600 09/26/23 0505  NA 131* 131* 127* 129*  K 5.2* 5.1 4.4 4.0  CL 96* 96* 93* 94*  CO2 25 24 22 25   GLUCOSE 84 99 156* 80  BUN 79* 78* 76* 76*  CREATININE 3.23* 3.35* 3.26* 3.16*  CALCIUM 8.7* 8.5* 8.1* 8.2*  MG 2.4 2.4 2.3 2.2  PROT 7.3 7.3  --  6.8  ALBUMIN 2.8* 2.8*  --  2.7*  AST 11* 8*  --  9*  ALT 8 8  --  8  ALKPHOS 67 70  --  65  BILITOT  0.9 1.0  --  1.1  GFRNONAA 17* 16* 17* 18*  ANIONGAP 10 11 12 10     Lipids No results for input(s): "CHOL", "TRIG", "HDL", "LABVLDL", "LDLCALC", "CHOLHDL" in the last 168 hours.  Hematology Recent Labs  Lab 09/24/23 0535 09/25/23 0600 09/26/23 0505  WBC 5.3 5.5 3.8*  RBC 4.12 3.65* 3.69*  HGB 11.2* 10.0* 10.0*  HCT 36.8 32.5* 33.2*  MCV 89.3 89.0 90.0  MCH 27.2 27.4 27.1  MCHC 30.4 30.8 30.1  RDW 14.3 14.1 14.0  PLT 234 198 188   Thyroid  Recent Labs  Lab 09/22/23 1607  TSH 4.243    BNP Recent Labs  Lab 09/22/23 0215  BNP 572.3*    DDimer No results for input(s): "DDIMER" in the last 168 hours.   Radiology    ECHOCARDIOGRAM LIMITED Result Date: 09/25/2023    ECHOCARDIOGRAM LIMITED REPORT   Patient Name:   DAKISHA SCHOOF Date of Exam: 09/25/2023 Medical Rec #:  [id number]       Height:       66.0 in Accession #:    [id number]      Weight:       439.8 lb Date of Birth:  1975-10-31      BSA:          2.796 m Patient Age:    47  years        BP:           128/76 mmHg Patient Gender: F               HR:           88 bpm. Exam Location:  Inpatient Procedure: Limited Echo, Limited Color Doppler, Cardiac Doppler and Intracardiac            Opacification Agent (Both Spectral and Color Flow Doppler were            utilized during procedure). Indications:    Chest Pain  History:        Patient has prior history of Echocardiogram examinations, most                 recent 08/02/2023. CHF, Stroke; Risk Factors:Diabetes and Current                 Smoker.  Sonographer:    Amy Chionchio Referring Phys: [id number] Anaika Santillano IMPRESSIONS  1. LVEF now severely reduced 25-30%. Apical akinesis. Findings either represent stress-induced cardiomyopathy vs LAD infarction. Clinical correlation recommended. Left ventricular ejection fraction, by estimation, is 25 to 30%. The left ventricle has severely decreased function. The left ventricle demonstrates regional wall motion abnormalities (see scoring diagram/findings for description). The left ventricular internal cavity size was severely dilated. Indeterminate diastolic filling due to E-A fusion. There is the interventricular septum is flattened in systole, consistent with right ventricular pressure overload.  2. Right ventricular systolic function is moderately reduced. The right ventricular size is severely enlarged. There is severely elevated pulmonary artery systolic pressure. The estimated right ventricular systolic pressure is 78.2 mmHg.  3. Right atrial size was mildly dilated.  4. The mitral valve is grossly normal. Mild mitral valve regurgitation. No evidence of mitral stenosis.  5. The inferior vena cava is dilated in size with >50% respiratory variability, suggesting right atrial pressure of 8 mmHg. Comparison(s): Changes from prior study are noted. LV function severely reduced 25-30% with apical WMA. RV severely dilated with moderately reduced function. FINDINGS  Left Ventricle: LVEF now severely reduced  25-30%. Apical akinesis. Findings either represent stress-induced cardiomyopathy vs LAD infarction. Clinical correlation recommended. Left ventricular ejection fraction, by estimation, is 25 to 30%. The left ventricle has severely decreased function. The left ventricle demonstrates regional wall motion abnormalities. Definity contrast agent was given IV to delineate the left ventricular endocardial borders. The left ventricular internal cavity size was severely dilated. There is no left ventricular hypertrophy. The interventricular septum is flattened in systole, consistent with right ventricular pressure overload. Indeterminate diastolic filling due to E-A fusion.  LV Wall Scoring: The entire apex is akinetic. Right Ventricle: The right ventricular size is severely enlarged. No increase in right ventricular wall thickness. Right ventricular systolic function is moderately reduced. There is severely elevated pulmonary artery systolic pressure. The tricuspid regurgitant velocity is 4.19 m/s, and with an assumed right atrial pressure of 8 mmHg, the estimated right ventricular systolic pressure is 78.2 mmHg. Right Atrium: Right atrial size was mildly dilated. Pericardium: There is no evidence of pericardial effusion. Mitral Valve: The mitral valve is grossly normal. Mild mitral valve regurgitation. No evidence of mitral valve stenosis. Tricuspid Valve: The tricuspid valve is grossly normal. Tricuspid valve regurgitation is mild . No evidence of tricuspid stenosis. Pulmonic Valve: The pulmonic valve was grossly normal. Pulmonic valve regurgitation is mild. No evidence of pulmonic stenosis. Aorta: The aortic root and ascending aorta are structurally normal, with no evidence of dilitation. Venous: The inferior vena cava is dilated in size with greater than 50% respiratory variability, suggesting right atrial pressure of 8 mmHg. Additional Comments: There is a small pleural effusion in the left lateral region.  LEFT  VENTRICLE PLAX 2D LVIDd:         6.70 cm      Diastology LVIDs:         6.10 cm      LV e' lateral:   13.20 cm/s LV PW:         0.80 cm      LV E/e' lateral: 11.7 LV IVS:        0.70 cm LVOT diam:     2.00 cm LV SV:         59 LV SV Index:   21 LVOT Area:     3.14 cm  LV Volumes (MOD) LV vol d, MOD A2C: 271.0 ml LV vol d, MOD A4C: 239.0 ml LV vol s, MOD A2C: 150.0 ml LV vol s, MOD A4C: 131.0 ml LV SV MOD A2C:     121.0 ml LV SV MOD A4C:     239.0 ml LV SV MOD BP:      115.7 ml RIGHT VENTRICLE          IVC RV Basal diam:  5.30 cm  IVC diam: 2.10 cm RV Mid diam:    4.20 cm TAPSE (M-mode): 2.4 cm LEFT ATRIUM             Index        RIGHT ATRIUM           Index LA Vol (A2C):   62.6 ml 22.39 ml/m  RA Area:     28.60 cm LA Vol (A4C):   62.3 ml 22.28 ml/m  RA Volume:   106.00 ml 37.91 ml/m LA Biplane Vol: 68.3 ml 24.43 ml/m  AORTIC VALVE             PULMONIC VALVE LVOT Vmax:   125.00 cm/s PV Vmax:          1.34 m/s LVOT Vmean:  87.600 cm/s PV Peak grad:     7.2 mmHg LVOT VTI:    0.189 m     PR End Diast Vel: 16.81 msec  AORTA Ao Root diam: 2.90 cm Ao Asc diam:  3.30 cm MITRAL VALVE                  TRICUSPID VALVE MV Area (PHT): 4.52 cm       TR Peak grad:   70.2 mmHg MV Decel Time: 168 msec       TR Vmax:        419.00 cm/s MR Peak grad:    88.0 mmHg MR Mean grad:    56.0 mmHg    SHUNTS MR Vmax:         469.00 cm/s  Systemic VTI:  0.19 m MR Vmean:        350.0 cm/s   Systemic Diam: 2.00 cm MR PISA:         2.26 cm MR PISA Eff ROA: 19 mm MR PISA Radius:  0.60 cm MV E velocity: 155.00 cm/s MV A velocity: 75.90 cm/s MV E/A ratio:  2.04 Lennie Odor MD Electronically signed by Lennie Odor MD Signature Date/Time: 09/25/2023/2:03:09 PM    Final    [place] EKG SITE RITE Result Date: 09/24/2023 If Site Rite image not attached, placement could not be confirmed due to current cardiac rhythm.  IR Fluoro Guide CV Line Right Result Date: 09/24/2023 INDICATION: ESRD.  Poor IV access. EXAM: * ABORTED * ULTRASOUND AND  FLUOROSCOPIC GUIDED PLACEMENT OF TUNNELED CENTRAL VENOUS CATHETER MEDICATIONS: None. ANESTHESIA/SEDATION: None FLUOROSCOPY TIME:  Fluoroscopic dose; 0 mGy COMPLICATIONS: None immediate. PROCEDURE: Informed written consent was obtained from the patient after a discussion of the risks, benefits, and alternatives to treatment. Questions regarding the procedure were encouraged and answered. The RIGHT neck and chest were prepped with chlorhexidine in a sterile fashion, and a sterile drape was applied covering the operative field. Maximum barrier sterile technique with sterile gowns and gloves were used for the procedure. A timeout was performed prior to the initiation of the procedure. Prior to anesthetic administration, the patient became increasingly uncomfortable and requested the procedure to be stopped. The sterile drape port removed. Incision was not performed. FINDINGS: Preprocedural ultrasound with a widely patent RIGHT internal jugular vein. Incision was not performed IMPRESSION: Aborted placement of a tunneled central venous catheter, secondary to patient discomfort. Roanna Banning, MD [facility]iology Electronically Signed   By: Roanna Banning M.D.   On: 09/24/2023 14:01    Cardiac Studies   Echo limited 09/25/23: 1. LVEF now severely reduced 25-30%. Apical akinesis. Findings either  represent stress-induced cardiomyopathy vs LAD infarction. Clinical  correlation recommended. Left ventricular ejection fraction, by  estimation, is 25 to 30%. The left ventricle has  severely decreased function. The left ventricle demonstrates regional wall  motion abnormalities (see scoring diagram/findings for description). The  left ventricular internal cavity size was severely dilated. Indeterminate  diastolic filling due to E-A  fusion. There is the interventricular septum is flattened in systole,  consistent with right ventricular pressure overload.   2. Right  ventricular systolic function is moderately reduced. The right  ventricular size is severely enlarged. There is severely elevated  pulmonary artery systolic pressure. The estimated right ventricular  systolic pressure is 78.2 mmHg.   3. Right atrial size was mildly dilated.   4. The mitral valve is grossly normal. Mild mitral valve regurgitation.  No evidence of  mitral stenosis.   5. The inferior vena cava is dilated in size with >50% respiratory  variability, suggesting right atrial pressure of 8 mmHg.   Patient Profile     48 y.o. 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    Last admission for CHF in Jan 2025 required milrinone for diuresis.  Assessment & Plan    Acute on chronic biventricular failure with grade III DD Acute on chronic renal failure Hypoalbuminemia She has required milrinone to help with diuresis (as recently as Jan 2025) - discharged on 80 mg torsemide BID and 12.5 mg spironolactone. Admission weight in Jan 2025 was 479 lbs --> 370 lbs at discharge. Baseline BNP felt 200-300 range.  - diuresis complicated by renal function - PICC placed for coox and milrinone - transitioned to lasix gtt yesterday, she continues to reports less than expected urination, but urine output increased to 1.6 cc yesterday  - sCr improving 3.13 - weight is 437 (439), admission weight was 445 lbs   Chest pain Coronary calcifications - echo now with further reduced LVEF 25-30% with WMA consistent with stress CM vs LAD territory infarct - CT chest reviewed that shows significant coronary calcifications (non gated CT) - only minimal improvement with IV NTG,  continues to intermittent CP overnight, may be able to titrate this - plan to diurese and may consider R/LHC once more euvolemic   PVCs - continue mexiletine - tolerating milrinone   Hx of PE/DVT On chronic eliquis Super morbid obesity CKD stage 3 -baseline felt near 1.7 Right BKA Hx of candida albicans bacteremia and MV endocarditis OSA - refuses CPAP Tobacco abuse - have discussed cessation        For questions or updates, please contact [facility] Please consult [email or web address] for contact info under        Signed, Marcelino Duster, PA  09/26/2023, 9:52 AM     I have seen and examined the patient along with Marcelino Duster, PA  NP.  I have reviewed the chart, notes and new data.  I agree with PA/NP's note.  Key new complaints: NTG did not help with CP or with diuresis.  Key examination changes: remains markedly edematous, lungs sound clear, RRR, loud P2. Weight down only 2 kg in 2 days.  BP softer Key new findings / data: rare PVCs , no NSVT. Good CO based on mixed venous O2. Remains hyponatremic and renal parameters are stagnant.  PLAN: Despite positive impact of IV milrinone on CO, diuresis is mediocre and renal parameters are not improving. Will try a dose of metolazone, but I think we need to transfer to [facility] for the Advanced Heart Failure team and R and L heart cath (if/when renal function improves).  CRITICAL CARE Performed by: Rachelle Hora Dyami Umbach   Total critical care time: 45 minutes  Critical care time was exclusive of separately billable procedures and treating other patients.  Critical care was necessary to treat or prevent imminent or life-threatening deterioration.  Critical care was time spent personally by me on the following activities: development of treatment plan with patient and/or surrogate as well as nursing, discussions with consultants, evaluation of patient's response to treatment, examination of patient, obtaining history from  patient or surrogate, ordering and performing treatments and interventions, ordering and review of laboratory studies, ordering and review of radiographic studies, pulse oximetry and re-evaluation of patient's condition.   Thurmon Fair, MD, [facility] [phone] 09/26/2023, 2:29 PM

## 2023-09-26 NOTE — Plan of Care (Signed)
  Problem: Education: Goal: Ability to describe self-care measures that may prevent or decrease complications (Diabetes Survival Skills Education) will improve Outcome: Progressing   Problem: Coping: Goal: Ability to adjust to condition or change in health will improve Outcome: Progressing   Problem: Fluid Volume: Goal: Ability to maintain a balanced intake and output will improve Outcome: Progressing   Problem: Health Behavior/Discharge Planning: Goal: Ability to identify and utilize available resources and services will improve Outcome: Progressing Goal: Ability to manage health-related needs will improve Outcome: Progressing   Problem: Metabolic: Goal: Ability to maintain appropriate glucose levels will improve Outcome: Progressing   Problem: Nutritional: Goal: Maintenance of adequate nutrition will improve Outcome: Progressing Goal: Progress toward achieving an optimal weight will improve Outcome: Progressing   Problem: Skin Integrity: Goal: Risk for impaired skin integrity will decrease Outcome: Progressing   Problem: Tissue Perfusion: Goal: Adequacy of tissue perfusion will improve Outcome: Progressing   Problem: Education: Goal: Knowledge of General Education information will improve Description: Including pain rating scale, medication(s)/side effects and non-pharmacologic comfort measures Outcome: Progressing   Problem: Health Behavior/Discharge Planning: Goal: Ability to manage health-related needs will improve Outcome: Progressing   Problem: Clinical Measurements: Goal: Ability to maintain clinical measurements within normal limits will improve Outcome: Progressing Goal: Will remain free from infection Outcome: Progressing Goal: Diagnostic test results will improve Outcome: Progressing Goal: Respiratory complications will improve Outcome: Progressing Goal: Cardiovascular complication will be avoided Outcome: Progressing   Problem: Activity: Goal:  Risk for activity intolerance will decrease Outcome: Progressing   Problem: Nutrition: Goal: Adequate nutrition will be maintained Outcome: Progressing   Problem: Coping: Goal: Level of anxiety will decrease Outcome: Progressing   Problem: Elimination: Goal: Will not experience complications related to bowel motility Outcome: Progressing Goal: Will not experience complications related to urinary retention Outcome: Progressing   Problem: Pain Managment: Goal: General experience of comfort will improve and/or be controlled Outcome: Progressing   Problem: Safety: Goal: Ability to remain free from injury will improve Outcome: Progressing   Problem: Skin Integrity: Goal: Risk for impaired skin integrity will decrease Outcome: Progressing   Cindy S. Clelia Croft BSN, RN, [employer], CCRN 09/26/2023 4:28 AM

## 2023-09-27 DIAGNOSIS — I5023 Acute on chronic systolic (congestive) heart failure: Secondary | ICD-10-CM | POA: Diagnosis not present

## 2023-09-27 LAB — BASIC METABOLIC PANEL
Anion gap: 12 (ref 5–15)
BUN: 78 mg/dL — ABNORMAL HIGH (ref 6–20)
CO2: 24 mmol/L (ref 22–32)
Calcium: 8.3 mg/dL — ABNORMAL LOW (ref 8.9–10.3)
Chloride: 94 mmol/L — ABNORMAL LOW (ref 98–111)
Creatinine, Ser: 3.06 mg/dL — ABNORMAL HIGH (ref 0.44–1.00)
GFR, Estimated: 18 mL/min — ABNORMAL LOW (ref >60)
Glucose, Bld: 85 mg/dL (ref 70–99)
Potassium: 3.8 mmol/L (ref 3.5–5.1)
Sodium: 130 mmol/L — ABNORMAL LOW (ref 135–145)

## 2023-09-27 LAB — MAGNESIUM: Magnesium: 2 mg/dL (ref 1.7–2.4)

## 2023-09-27 LAB — GLUCOSE, CAPILLARY
Glucose-Capillary: 85 mg/dL (ref 70–99)
Glucose-Capillary: 86 mg/dL (ref 70–99)
Glucose-Capillary: 87 mg/dL (ref 70–99)
Glucose-Capillary: 93 mg/dL (ref 70–99)
Glucose-Capillary: 75 mg/dL (ref 70–99)

## 2023-09-27 LAB — COOXEMETRY PANEL
Carboxyhemoglobin: 1.3 % (ref 0.5–1.5)
Methemoglobin: <0.7 % (ref 0.0–1.5)
O2 Saturation: 78.8 %
Total hemoglobin: 10 g/dL — ABNORMAL LOW (ref 12.0–16.0)

## 2023-09-27 MED ORDER — CLOTRIMAZOLE 1 % EX CREA
1.0000 | TOPICAL_CREAM | Freq: Two times a day (BID) | CUTANEOUS | Status: DC
Start: 2023-09-28 — End: 2023-10-13
  Administered 2023-09-28 – 2023-10-13 (×30): 1 via TOPICAL
  Filled 2023-09-27: qty 15

## 2023-09-27 MED ORDER — INSULIN GLARGINE 100 UNIT/ML ~~LOC~~ SOLN
10.0000 [IU] | Freq: Every day | SUBCUTANEOUS | Status: DC
  Administered 2023-09-28 – 2023-10-07 (×11): 10 [IU] via SUBCUTANEOUS
  Filled 2023-09-27 (×12): qty 0.1

## 2023-09-27 MED ORDER — BUMETANIDE 0.25 MG/ML IJ SOLN
2.0000 mg | Freq: Three times a day (TID) | INTRAMUSCULAR | Status: DC
Start: 2023-09-27 — End: 2023-09-28
  Administered 2023-09-27 – 2023-09-28 (×3): 2 mg via INTRAVENOUS
  Filled 2023-09-27: qty 10
  Filled 2023-09-27 (×2): qty 8

## 2023-09-27 MED ORDER — METOLAZONE 5 MG PO TABS
5.0000 mg | ORAL_TABLET | Freq: Once | ORAL | Status: AC
  Administered 2023-09-27: 5 mg via ORAL
  Filled 2023-09-27: qty 1

## 2023-09-27 MED ORDER — ACETAZOLAMIDE 250 MG PO TABS
250.0000 mg | ORAL_TABLET | Freq: Once | ORAL | Status: AC
  Administered 2023-09-27: 250 mg via ORAL
  Filled 2023-09-27: qty 1

## 2023-09-27 NOTE — Plan of Care (Cosign Needed)

## 2023-09-27 NOTE — Plan of Care (Signed)
  Problem: Education: Goal: Ability to describe self-care measures that may prevent or decrease complications (Diabetes Survival Skills Education) will improve Outcome: Progressing Goal: Individualized Educational Video(s) Outcome: Progressing   Problem: Coping: Goal: Ability to adjust to condition or change in health will improve Outcome: Progressing   Problem: Fluid Volume: Goal: Ability to maintain a balanced intake and output will improve Outcome: Progressing   Problem: Health Behavior/Discharge Planning: Goal: Ability to identify and utilize available resources and services will improve Outcome: Progressing Goal: Ability to manage health-related needs will improve Outcome: Progressing   Problem: Metabolic: Goal: Ability to maintain appropriate glucose levels will improve Outcome: Progressing   Problem: Skin Integrity: Goal: Risk for impaired skin integrity will decrease Outcome: Progressing   Problem: Tissue Perfusion: Goal: Adequacy of tissue perfusion will improve Outcome: Progressing   Problem: Education: Goal: Knowledge of General Education information will improve Description: Including pain rating scale, medication(s)/side effects and non-pharmacologic comfort measures Outcome: Progressing   Problem: Health Behavior/Discharge Planning: Goal: Ability to manage health-related needs will improve Outcome: Progressing   Problem: Clinical Measurements: Goal: Ability to maintain clinical measurements within normal limits will improve Outcome: Progressing Goal: Will remain free from infection Outcome: Progressing Goal: Diagnostic test results will improve Outcome: Progressing Goal: Respiratory complications will improve Outcome: Progressing Goal: Cardiovascular complication will be avoided Outcome: Progressing   Problem: Activity: Goal: Risk for activity intolerance will decrease Outcome: Progressing   Problem: Coping: Goal: Level of anxiety will  decrease Outcome: Progressing   Problem: Elimination: Goal: Will not experience complications related to bowel motility Outcome: Progressing Goal: Will not experience complications related to urinary retention Outcome: Progressing   Problem: Pain Managment: Goal: General experience of comfort will improve and/or be controlled Outcome: Progressing   Problem: Safety: Goal: Ability to remain free from injury will improve Outcome: Progressing   Problem: Skin Integrity: Goal: Risk for impaired skin integrity will decrease Outcome: Progressing

## 2023-09-27 NOTE — Progress Notes (Signed)
 PROGRESS NOTE    Karen Bennett  ZOX:[id number] DOB: 10/09/1975 DOA: 09/22/2023 PCP: [name], NP   Brief Narrative:  48 year old female with history of morbid obesity, chronic systolic CHF, HTN, HLD, IDDM, hypothyroidism, prior DVT/PE on Eliquis, history of Candida mitral valve endocarditis in 2022, pulmonary hypertension, chronic "pulmonary, CKD 3B, right BKA, prior CVA, who comes into the hospital with worsening abdominal distention, lower extremity swelling and shortness of breath.  She was recently hospitalized and discharged in January 2025 for acute on chronic systolic CHF, required significant diuresis with midodrine augmentation and seen by advanced heart failure service.  Since discharge she tells me she has been doing well, compliant with her home medications as well as a low-sodium diet, but over the last several days-a week she has noticed increased swelling, decreased urination, and worsening shortness of breath.  She is unsure whether she gained any weight that she cannot weigh herself.  In the ED she was found to have acute kidney injury, hyperkalemia, and imaging and exam was consistent with fluid overload.    Hospitalist called for admission, cardiology was consulted, she was given Lasix and was admitted to the hospital.  Hospitalization complicated by poor response to medical therapy.  Assessment & Plan:   Principal Problem:   Acute on chronic HFrEF (heart failure with reduced ejection fraction) (HCC) Active Problems:   Acute kidney injury superimposed on stage 3a chronic kidney disease (HCC)   History of pulmonary embolism   Insulin dependent type 2 diabetes mellitus (HCC)   Hyperlipidemia associated with type 2 diabetes mellitus (HCC)   Hypothyroidism  Acute on chronic combined CHF, biventricular heart failure  -Recurrent issues, admitted just last month for similar episode -Cardiology consulted and continues to follow, currently requiring IV Lasix, milrinone  and nitroglycerin -Likely to require left and right heart cath pending clinical improvement and renal function -Continue diuretics per cardiology, urine output improving but not to goal -Hold ACE, ARB, ARNI, SGLT2 inhibitor in the setting of AKI  Chest pain with known coronary artery calcifications  - Cardiology considering catheterization as above, patient reports she was previously being considered for cardiac cath in January  - Transfer to main campus for close follow-up with heart failure team as well as to decrease logistical burden for possible heart cath - Patient transiently placed amiodarone drip for chest pain with subsequent hypotension -chest pain resolved with no return  Acute kidney injury on chronic kidney disease stage IIIb improving -Baseline creatinine 2.2, peak  3.7 at intake, downtrending appropriately albeit slowly -Avoid nephrotoxics, IV fluids on hold in the setting of volume overload as above   Vasculopath Difficult access -No difficult central Have had significant difficulties with obtaining central access given size, and ability to lay flat as well as known right-sided subclavian stenosis.  After discussing with the PICC line team, PCCM, IR, she eventually had a left-sided PICC placed by PICC team -Due to significant difficulties with central access, when she is volume optimized she may benefit from a port prior to discharge.  This was discussed with the patient this morning and she is in agreement given significant difficulties that she encountered in January as well.  Will contact IR for port placement 1 to 2 days prior to discharge  Hyperkalemia-due to AKI, potassium improving while receiving Lasix.  Monitor   Hyperphosphatemia-due to CKD   Hyponatremia-hypervolemic, continue diuresis   Anemia of chronic kidney disease-hemoglobin 10.0, overall stable, no bleeding   Type 2 diabetes mellitus, poorly controlled-continue glargine,  sliding scale Morbid obesity  Body mass index is 70.94 kg/m.  DVT prophylaxis:  apixaban (ELIQUIS) tablet 5 mg   Code Status:   Code Status: Full Code  Family Communication: None present  Status is: Inpatient  Dispo: The patient is from: Home              Anticipated d/c is to: To be determined              Anticipated d/c date is: To be determined              Patient currently not medically stable for discharge  Consultants:  Cardiology  Procedures:  None  Antimicrobials:  None  Subjective: No acute issues or events overnight, patient denies fevers, chills, denies chest pain, shortness of breath, nausea, vomiting, diarrhea constipation   Objective: Vitals:   09/27/23 0500 09/27/23 0530 09/27/23 0600 09/27/23 0630  BP: 121/70 125/72 (!) 120/53 (!) 112/54  Pulse: 100 (!) 102 (!) 103 100  Resp: 16 (!) 21 15 13   Temp:      TempSrc:      SpO2: 96% 95% (!) 89% 92%  Weight:    (!) 199.4 kg  Height:        Intake/Output Summary (Last 24 hours) at 09/27/2023 0716 Last data filed at 09/27/2023 0650 Gross per 24 hour  Intake 1921.4 ml  Output 1050 ml  Net 871.4 ml   Filed Weights   09/25/23 0500 09/26/23 0336 09/27/23 0630  Weight: (!) 199.5 kg (!) 198.4 kg (!) 199.4 kg    Examination:  General:  Pleasantly resting in bed, No acute distress. HEENT:  Normocephalic atraumatic.  Sclerae nonicteric, noninjected.  Extraocular movements intact bilaterally. Neck:  Without mass or deformity.  Trachea is midline. Lungs:  Clear to auscultate bilaterally without rhonchi, wheeze, or rales. Heart:  Regular rate and rhythm.  Without murmurs, rubs, or gallops. Abdomen:  Soft, morbidly obese nontender, nondistended.  Without guarding or rebound. Extremities: Without cyanosis, clubbing, 2+ pitting edema diffusely Skin:  Warm and dry, no erythema.  Data Reviewed: I have personally reviewed following labs and imaging studies  CBC: Recent Labs  Lab 09/22/23 0215 09/22/23 0220 09/23/23 0534 09/24/23 0535  09/25/23 0600 09/26/23 0505  WBC 5.6  --  4.4 5.3 5.5 3.8*  NEUTROABS 3.7  --   --   --   --   --   HGB 11.0* 12.2 11.4* 11.2* 10.0* 10.0*  HCT 35.9* 36.0 38.1 36.8 32.5* 33.2*  MCV 87.6  --  89.6 89.3 89.0 90.0  PLT 248  --  225 234 198 188   Basic Metabolic Panel: Recent Labs  Lab 09/22/23 0656 09/23/23 0534 09/24/23 0535 09/25/23 0600 09/26/23 0505  NA 131* 131* 131* 127* 129*  K 5.0 5.2* 5.1 4.4 4.0  CL 94* 96* 96* 93* 94*  CO2 28 25 24 22 25   GLUCOSE 133* 84 99 156* 80  BUN 73* 79* 78* 76* 76*  CREATININE 3.29* 3.23* 3.35* 3.26* 3.16*  CALCIUM 8.7* 8.7* 8.5* 8.1* 8.2*  MG 2.2 2.4 2.4 2.3 2.2  PHOS  --  7.6*  --   --  7.1*   GFR: Estimated Creatinine Clearance: 40.5 mL/min (A) (by C-G formula based on SCr of 3.16 mg/dL (H)).  Liver Function Tests: Recent Labs  Lab 09/22/23 0656 09/23/23 0534 09/24/23 0535 09/26/23 0505  AST 9* 11* 8* 9*  ALT 9 8 8 8   ALKPHOS 63 67 70 65  BILITOT 0.9 0.9  1.0 1.1  PROT 7.4 7.3 7.3 6.8  ALBUMIN 2.9* 2.8* 2.8* 2.7*   CBG: Recent Labs  Lab 09/26/23 1122 09/26/23 1536 09/26/23 1539 09/26/23 2056 09/27/23 0416  GLUCAP 73 58* 76 87 85   Sepsis Labs: Recent Labs  Lab 09/22/23 0823  LATICACIDVEN 0.7    Recent Results (from the past 240 hours)  MRSA Next Gen by PCR, Nasal     Status: Abnormal   Collection Time: 09/22/23 10:06 PM   Specimen: Nasal Mucosa; Nasal Swab  Result Value Ref Range Status   MRSA by PCR Next Gen DETECTED (A) NOT DETECTED Final    Comment: RESULT CALLED TO, READ BACK BY AND VERIFIED WITH: SIMON,K AT 0429 ON 09/23/23 BY LUZOLOP (NOTE) The GeneXpert MRSA Assay (FDA approved for NASAL specimens only), is one component of a comprehensive MRSA colonization surveillance program. It is not intended to diagnose MRSA infection nor to guide or monitor treatment for MRSA infections. Test performance is not FDA approved in patients less than 87 years old. Performed at [facility], [address], [place], [place] [address]   MRSA Next Gen by PCR, Nasal     Status: None   Collection Time: 09/24/23  4:49 PM   Specimen: Nasal Mucosa; Nasal Swab  Result Value Ref Range Status   MRSA by PCR Next Gen NOT DETECTED NOT DETECTED Final    Comment: (NOTE) The GeneXpert MRSA Assay (FDA approved for NASAL specimens only), is one component of a comprehensive MRSA colonization surveillance program. It is not intended to diagnose MRSA infection nor to guide or monitor treatment for MRSA infections. Test performance is not FDA approved in patients less than 68 years old. Performed at [facility], [address] [place], [place] [address]          Radiology Studies: ECHOCARDIOGRAM LIMITED Result Date: 09/25/2023    ECHOCARDIOGRAM LIMITED REPORT   Patient Name:   TENESIA ESCUDERO Date of Exam: 09/25/2023 Medical Rec #:  [id number]       Height:       66.0 in Accession #:    [id number]      Weight:       439.8 lb Date of Birth:  January 12, 1976      BSA:          2.796 m Patient Age:    47 years        BP:           128/76 mmHg Patient Gender: F               HR:           88 bpm. Exam Location:  Inpatient Procedure: Limited Echo, Limited Color Doppler, Cardiac Doppler and Intracardiac            Opacification Agent (Both Spectral and Color Flow Doppler were            utilized during procedure). Indications:    Chest Pain  History:        Patient has prior history of Echocardiogram examinations, most                 recent 08/02/2023. CHF, Stroke; Risk Factors:Diabetes and Current                 Smoker.  Sonographer:    Amy Chionchio Referring Phys: [id number] MIHAI CROITORU IMPRESSIONS  1. LVEF now severely reduced 25-30%. Apical akinesis. Findings either represent stress-induced cardiomyopathy vs  LAD infarction. Clinical correlation recommended. Left ventricular ejection fraction, by estimation, is 25 to 30%. The left ventricle has severely decreased function. The left ventricle  demonstrates regional wall motion abnormalities (see scoring diagram/findings for description). The left ventricular internal cavity size was severely dilated. Indeterminate diastolic filling due to E-A fusion. There is the interventricular septum is flattened in systole, consistent with right ventricular pressure overload.  2. Right ventricular systolic function is moderately reduced. The right ventricular size is severely enlarged. There is severely elevated pulmonary artery systolic pressure. The estimated right ventricular systolic pressure is 78.2 mmHg.  3. Right atrial size was mildly dilated.  4. The mitral valve is grossly normal. Mild mitral valve regurgitation. No evidence of mitral stenosis.  5. The inferior vena cava is dilated in size with >50% respiratory variability, suggesting right atrial pressure of 8 mmHg. Comparison(s): Changes from prior study are noted. LV function severely reduced 25-30% with apical WMA. RV severely dilated with moderately reduced function. FINDINGS  Left Ventricle: LVEF now severely reduced 25-30%. Apical akinesis. Findings either represent stress-induced cardiomyopathy vs LAD infarction. Clinical correlation recommended. Left ventricular ejection fraction, by estimation, is 25 to 30%. The left ventricle has severely decreased function. The left ventricle demonstrates regional wall motion abnormalities. Definity contrast agent was given IV to delineate the left ventricular endocardial borders. The left ventricular internal cavity size was severely dilated. There is no left ventricular hypertrophy. The interventricular septum is flattened in systole, consistent with right ventricular pressure overload. Indeterminate diastolic filling due to E-A fusion.  LV Wall Scoring: The entire apex is akinetic. Right Ventricle: The right ventricular size is severely enlarged. No increase in right ventricular wall thickness. Right ventricular systolic function is moderately reduced. There  is severely elevated pulmonary artery systolic pressure. The tricuspid regurgitant velocity is 4.19 m/s, and with an assumed right atrial pressure of 8 mmHg, the estimated right ventricular systolic pressure is 78.2 mmHg. Right Atrium: Right atrial size was mildly dilated. Pericardium: There is no evidence of pericardial effusion. Mitral Valve: The mitral valve is grossly normal. Mild mitral valve regurgitation. No evidence of mitral valve stenosis. Tricuspid Valve: The tricuspid valve is grossly normal. Tricuspid valve regurgitation is mild . No evidence of tricuspid stenosis. Pulmonic Valve: The pulmonic valve was grossly normal. Pulmonic valve regurgitation is mild. No evidence of pulmonic stenosis. Aorta: The aortic root and ascending aorta are structurally normal, with no evidence of dilitation. Venous: The inferior vena cava is dilated in size with greater than 50% respiratory variability, suggesting right atrial pressure of 8 mmHg. Additional Comments: There is a small pleural effusion in the left lateral region.  LEFT VENTRICLE PLAX 2D LVIDd:         6.70 cm      Diastology LVIDs:         6.10 cm      LV e' lateral:   13.20 cm/s LV PW:         0.80 cm      LV E/e' lateral: 11.7 LV IVS:        0.70 cm LVOT diam:     2.00 cm LV SV:         59 LV SV Index:   21 LVOT Area:     3.14 cm  LV Volumes (MOD) LV vol d, MOD A2C: 271.0 ml LV vol d, MOD A4C: 239.0 ml LV vol s, MOD A2C: 150.0 ml LV vol s, MOD A4C: 131.0 ml LV SV MOD A2C:  121.0 ml LV SV MOD A4C:     239.0 ml LV SV MOD BP:      115.7 ml RIGHT VENTRICLE          IVC RV Basal diam:  5.30 cm  IVC diam: 2.10 cm RV Mid diam:    4.20 cm TAPSE (M-mode): 2.4 cm LEFT ATRIUM             Index        RIGHT ATRIUM           Index LA Vol (A2C):   62.6 ml 22.39 ml/m  RA Area:     28.60 cm LA Vol (A4C):   62.3 ml 22.28 ml/m  RA Volume:   106.00 ml 37.91 ml/m LA Biplane Vol: 68.3 ml 24.43 ml/m  AORTIC VALVE             PULMONIC VALVE LVOT Vmax:   125.00 cm/s PV  Vmax:          1.34 m/s LVOT Vmean:  87.600 cm/s PV Peak grad:     7.2 mmHg LVOT VTI:    0.189 m     PR End Diast Vel: 16.81 msec  AORTA Ao Root diam: 2.90 cm Ao Asc diam:  3.30 cm MITRAL VALVE                  TRICUSPID VALVE MV Area (PHT): 4.52 cm       TR Peak grad:   70.2 mmHg MV Decel Time: 168 msec       TR Vmax:        419.00 cm/s MR Peak grad:    88.0 mmHg MR Mean grad:    56.0 mmHg    SHUNTS MR Vmax:         469.00 cm/s  Systemic VTI:  0.19 m MR Vmean:        350.0 cm/s   Systemic Diam: 2.00 cm MR PISA:         2.26 cm MR PISA Eff ROA: 19 mm MR PISA Radius:  0.60 cm MV E velocity: 155.00 cm/s MV A velocity: 75.90 cm/s MV E/A ratio:  2.04 Lennie Odor MD Electronically signed by Lennie Odor MD Signature Date/Time: 09/25/2023/2:03:09 PM    Final     Scheduled Meds:  apixaban  5 mg Oral BID   atorvastatin  40 mg Oral QPM   Chlorhexidine Gluconate Cloth  6 each Topical Daily   clotrimazole   Topical BID   ezetimibe  10 mg Oral Daily   insulin aspart  0-15 Units Subcutaneous TID WC   insulin aspart  0-5 Units Subcutaneous QHS   insulin glargine-yfgn  10 Units Subcutaneous QHS   levothyroxine  75 mcg Oral QAC breakfast   mexiletine  250 mg Oral Q12H   mupirocin ointment  1 Application Nasal BID   sodium chloride flush  10-40 mL Intracatheter Q12H   Continuous Infusions:  furosemide (LASIX) 200 mg in dextrose 5 % 100 mL (2 mg/mL) infusion 10 mg/hr (09/27/23 0650)   milrinone 0.25 mcg/kg/min (09/27/23 0650)   nitroGLYCERIN Stopped (09/26/23 1140)   promethazine (PHENERGAN) injection (IM or IVPB) 150 mL/hr at 09/27/23 0650     LOS: 5 days   Time spent:  Azucena Fallen, DO [facility]  If 7PM-7AM, please contact night-coverage [email or web address]  09/27/2023, 7:16 AM

## 2023-09-27 NOTE — Progress Notes (Addendum)
 Rounding Note    Patient Name: Karen Bennett Date of Encounter: 09/27/2023  [facility] Cardiologist: Chilton Si, MD   Subjective   No significant overnight evens. Patient still did not have significant urinary output yesterday even with the dose of Metolazone. She denies any recurrent chest pain since yesterday. She denies any shortness of breath if she is laying still but reports dyspnea with minimal activity such as moving around in  bed. She also reports pressure in her legs from the edema.  Inpatient Medications    Scheduled Meds:  apixaban  5 mg Oral BID   atorvastatin  40 mg Oral QPM   Chlorhexidine Gluconate Cloth  6 each Topical Daily   clotrimazole   Topical BID   ezetimibe  10 mg Oral Daily   insulin aspart  0-15 Units Subcutaneous TID WC   insulin aspart  0-5 Units Subcutaneous QHS   insulin glargine-yfgn  10 Units Subcutaneous QHS   levothyroxine  75 mcg Oral QAC breakfast   mexiletine  250 mg Oral Q12H   mupirocin ointment  1 Application Nasal BID   sodium chloride flush  10-40 mL Intracatheter Q12H   Continuous Infusions:  furosemide (LASIX) 200 mg in dextrose 5 % 100 mL (2 mg/mL) infusion 10 mg/hr (09/27/23 0650)   milrinone 0.25 mcg/kg/min (09/27/23 0650)   nitroGLYCERIN Stopped (09/26/23 1140)   promethazine (PHENERGAN) injection (IM or IVPB) 150 mL/hr at 09/27/23 0650   PRN Meds: acetaminophen **OR** acetaminophen, hydrOXYzine, mouth rinse, oxyCODONE, prochlorperazine, promethazine (PHENERGAN) injection (IM or IVPB), sodium chloride flush   Vital Signs    Vitals:   09/27/23 0500 09/27/23 0530 09/27/23 0600 09/27/23 0630  BP: 121/70 125/72 (!) 120/53 (!) 112/54  Pulse: 100 (!) 102 (!) 103 100  Resp: 16 (!) 21 15 13   Temp:      TempSrc:      SpO2: 96% 95% (!) 89% 92%  Weight:    (!) 199.4 kg  Height:        Intake/Output Summary (Last 24 hours) at 09/27/2023 0704 Last data filed at 09/27/2023 0650 Gross per 24 hour  Intake 1921.4  ml  Output 1050 ml  Net 871.4 ml      09/27/2023    6:30 AM 09/26/2023    3:36 AM 09/25/2023    5:00 AM  Last 3 Weights  Weight (lbs) 439 lb 8 oz 437 lb 6.3 oz 439 lb 13.1 oz  Weight (kg) 199.356 kg 198.4 kg 199.5 kg      Telemetry    Sinus rhythm with rates in the 90s to low 100s - Personally Reviewed  ECG    No new ECG tracing. - Personally Reviewed  Physical Exam   GEN: Morbidly obese Caucasian female. No acute distress. On 5L of O2 via nasal cannula. Neck: Unable to accurately assess JVD due to body habitus. Cardiac: RRR. No murmurs, rubs, or gallops.  Respiratory: No increased work of breathing. Decreased breath sounds throughout but no significant wheezes, rhonchi, or rales appreciated.  MS: S/p right BKA. Significant edema of lower extremities extending up to abdomen. Neuro:  No focal deficits. Psych: Normal affect. Responds appropriately.  Labs    High Sensitivity Troponin:  No results for input(s): "TROPONINIHS" in the last 720 hours.   Chemistry Recent Labs  Lab 09/23/23 0534 09/24/23 0535 09/25/23 0600 09/26/23 0505  NA 131* 131* 127* 129*  K 5.2* 5.1 4.4 4.0  CL 96* 96* 93* 94*  CO2 25 24 22  25  GLUCOSE 84 99 156* 80  BUN 79* 78* 76* 76*  CREATININE 3.23* 3.35* 3.26* 3.16*  CALCIUM 8.7* 8.5* 8.1* 8.2*  MG 2.4 2.4 2.3 2.2  PROT 7.3 7.3  --  6.8  ALBUMIN 2.8* 2.8*  --  2.7*  AST 11* 8*  --  9*  ALT 8 8  --  8  ALKPHOS 67 70  --  65  BILITOT 0.9 1.0  --  1.1  GFRNONAA 17* 16* 17* 18*  ANIONGAP 10 11 12 10     Lipids No results for input(s): "CHOL", "TRIG", "HDL", "LABVLDL", "LDLCALC", "CHOLHDL" in the last 168 hours.  Hematology Recent Labs  Lab 09/24/23 0535 09/25/23 0600 09/26/23 0505  WBC 5.3 5.5 3.8*  RBC 4.12 3.65* 3.69*  HGB 11.2* 10.0* 10.0*  HCT 36.8 32.5* 33.2*  MCV 89.3 89.0 90.0  MCH 27.2 27.4 27.1  MCHC 30.4 30.8 30.1  RDW 14.3 14.1 14.0  PLT 234 198 188   Thyroid  Recent Labs  Lab 09/22/23 1607  TSH 4.243    BNP Recent  Labs  Lab 09/22/23 0215  BNP 572.3*    DDimer No results for input(s): "DDIMER" in the last 168 hours.   Radiology    ECHOCARDIOGRAM LIMITED Result Date: 09/25/2023    ECHOCARDIOGRAM LIMITED REPORT   Patient Name:   Karen Bennett Date of Exam: 09/25/2023 Medical Rec #:  [id number]       Height:       66.0 in Accession #:    [id number]      Weight:       439.8 lb Date of Birth:  [birth year]      BSA:          2.796 m Patient Age:    47 years        BP:           128/76 mmHg Patient Gender: F               HR:           88 bpm. Exam Location:  Inpatient Procedure: Limited Echo, Limited Color Doppler, Cardiac Doppler and Intracardiac            Opacification Agent (Both Spectral and Color Flow Doppler were            utilized during procedure). Indications:    Chest Pain  History:        Patient has prior history of Echocardiogram examinations, most                 recent 08/02/2023. CHF, Stroke; Risk Factors:Diabetes and Current                 Smoker.  Sonographer:    Amy Chionchio Referring Phys: [id number] Clevon Khader IMPRESSIONS  1. LVEF now severely reduced 25-30%. Apical akinesis. Findings either represent stress-induced cardiomyopathy vs LAD infarction. Clinical correlation recommended. Left ventricular ejection fraction, by estimation, is 25 to 30%. The left ventricle has severely decreased function. The left ventricle demonstrates regional wall motion abnormalities (see scoring diagram/findings for description). The left ventricular internal cavity size was severely dilated. Indeterminate diastolic filling due to E-A fusion. There is the interventricular septum is flattened in systole, consistent with right ventricular pressure overload.  2. Right ventricular systolic function is moderately reduced. The right ventricular size is severely enlarged. There is severely elevated pulmonary artery systolic pressure. The estimated right ventricular systolic pressure is 78.2 mmHg.  3. Right atrial  size was mildly  dilated.  4. The mitral valve is grossly normal. Mild mitral valve regurgitation. No evidence of mitral stenosis.  5. The inferior vena cava is dilated in size with >50% respiratory variability, suggesting right atrial pressure of 8 mmHg. Comparison(s): Changes from prior study are noted. LV function severely reduced 25-30% with apical WMA. RV severely dilated with moderately reduced function. FINDINGS  Left Ventricle: LVEF now severely reduced 25-30%. Apical akinesis. Findings either represent stress-induced cardiomyopathy vs LAD infarction. Clinical correlation recommended. Left ventricular ejection fraction, by estimation, is 25 to 30%. The left ventricle has severely decreased function. The left ventricle demonstrates regional wall motion abnormalities. Definity contrast agent was given IV to delineate the left ventricular endocardial borders. The left ventricular internal cavity size was severely dilated. There is no left ventricular hypertrophy. The interventricular septum is flattened in systole, consistent with right ventricular pressure overload. Indeterminate diastolic filling due to E-A fusion.  LV Wall Scoring: The entire apex is akinetic. Right Ventricle: The right ventricular size is severely enlarged. No increase in right ventricular wall thickness. Right ventricular systolic function is moderately reduced. There is severely elevated pulmonary artery systolic pressure. The tricuspid regurgitant velocity is 4.19 m/s, and with an assumed right atrial pressure of 8 mmHg, the estimated right ventricular systolic pressure is 78.2 mmHg. Right Atrium: Right atrial size was mildly dilated. Pericardium: There is no evidence of pericardial effusion. Mitral Valve: The mitral valve is grossly normal. Mild mitral valve regurgitation. No evidence of mitral valve stenosis. Tricuspid Valve: The tricuspid valve is grossly normal. Tricuspid valve regurgitation is mild . No evidence of tricuspid stenosis. Pulmonic  Valve: The pulmonic valve was grossly normal. Pulmonic valve regurgitation is mild. No evidence of pulmonic stenosis. Aorta: The aortic root and ascending aorta are structurally normal, with no evidence of dilitation. Venous: The inferior vena cava is dilated in size with greater than 50% respiratory variability, suggesting right atrial pressure of 8 mmHg. Additional Comments: There is a small pleural effusion in the left lateral region.  LEFT VENTRICLE PLAX 2D LVIDd:         6.70 cm      Diastology LVIDs:         6.10 cm      LV e' lateral:   13.20 cm/s LV PW:         0.80 cm      LV E/e' lateral: 11.7 LV IVS:        0.70 cm LVOT diam:     2.00 cm LV SV:         59 LV SV Index:   21 LVOT Area:     3.14 cm  LV Volumes (MOD) LV vol d, MOD A2C: 271.0 ml LV vol d, MOD A4C: 239.0 ml LV vol s, MOD A2C: 150.0 ml LV vol s, MOD A4C: 131.0 ml LV SV MOD A2C:     121.0 ml LV SV MOD A4C:     239.0 ml LV SV MOD BP:      115.7 ml RIGHT VENTRICLE          IVC RV Basal diam:  5.30 cm  IVC diam: 2.10 cm RV Mid diam:    4.20 cm TAPSE (M-mode): 2.4 cm LEFT ATRIUM             Index        RIGHT ATRIUM           Index LA Vol (A2C):   62.6 ml 22.39 ml/m  RA Area:     28.60 cm LA Vol (A4C):   62.3 ml 22.28 ml/m  RA Volume:   106.00 ml 37.91 ml/m LA Biplane Vol: 68.3 ml 24.43 ml/m  AORTIC VALVE             PULMONIC VALVE LVOT Vmax:   125.00 cm/s PV Vmax:          1.34 m/s LVOT Vmean:  87.600 cm/s PV Peak grad:     7.2 mmHg LVOT VTI:    0.189 m     PR End Diast Vel: 16.81 msec  AORTA Ao Root diam: 2.90 cm Ao Asc diam:  3.30 cm MITRAL VALVE                  TRICUSPID VALVE MV Area (PHT): 4.52 cm       TR Peak grad:   70.2 mmHg MV Decel Time: 168 msec       TR Vmax:        419.00 cm/s MR Peak grad:    88.0 mmHg MR Mean grad:    56.0 mmHg    SHUNTS MR Vmax:         469.00 cm/s  Systemic VTI:  0.19 m MR Vmean:        350.0 cm/s   Systemic Diam: 2.00 cm MR PISA:         2.26 cm MR PISA Eff ROA: 19 mm MR PISA Radius:  0.60 cm MV E  velocity: 155.00 cm/s MV A velocity: 75.90 cm/s MV E/A ratio:  2.04 Lennie Odor MD Electronically signed by Lennie Odor MD Signature Date/Time: 2023-10-09/2:03:09 PM    Final     Cardiac Studies   Limited Echocardiogram 10-09-23: Impressions: 1. LVEF now severely reduced 25-30%. Apical akinesis. Findings either  represent stress-induced cardiomyopathy vs LAD infarction. Clinical  correlation recommended. Left ventricular ejection fraction, by  estimation, is 25 to 30%. The left ventricle has  severely decreased function. The left ventricle demonstrates regional wall  motion abnormalities (see scoring diagram/findings for description). The  left ventricular internal cavity size was severely dilated. Indeterminate  diastolic filling due to E-A  fusion. There is the interventricular septum is flattened in systole,  consistent with right ventricular pressure overload.   2. Right ventricular systolic function is moderately reduced. The right  ventricular size is severely enlarged. There is severely elevated  pulmonary artery systolic pressure. The estimated right ventricular  systolic pressure is 78.2 mmHg.   3. Right atrial size was mildly dilated.   4. The mitral valve is grossly normal. Mild mitral valve regurgitation.  No evidence of mitral stenosis.   5. The inferior vena cava is dilated in size with >50% respiratory  variability, suggesting right atrial pressure of 8 mmHg.   Comparison(s): Changes from prior study are noted. LV function severely  reduced 25-30% with apical WMA. RV severely dilated with moderately  reduced function.   Patient Profile     48 y.o. 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 She was recently admitted in 07/2023 for acute on chronic CHF and required Milrinone to assist with diuresis. She was readmitted on 09/22/2023  for acute on chronic CHF after presenting with worsening lower extremity edema and dyspnea on exertion.  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 Chest x-ray showed diffuse interstitial coarsening and pulmonary vascular congestion as well as hazy opacities over the lower lungs likely due to layering pleural effusions. Echo showed LVEF of 25-30% (down from 35-40% in 07/2023) with apical akinesis, severely enlarged RV with moderately reduced RV function and interventricular flattening in systole consistent with RV pressure overload, severely elevated RVSP of 78.2 mmHg, and mild MR. PICC line was placed and she was started on IV Milrinone and IV Lasix drip but still has not had significant urinary output. He was given a dose of Metolazone yesterday. Documented urinary output only 1.1 L yesterday and still net positive 1.7 L this admission. Weight 439 lbs today (down from 445 lbs on admission but up from 370 lbs at recent discharge in 07/2023). Renal function stable.  - Still significantly volume overloaded on exam with no significant urinary output with Metolazone. CVP 23 today. CO-OX 78. - Continue IV Lasix drip. Can given another dose of Metolazone 5mg  today given renal function and sodium level are stable. - No ACEi/ ARB/ ARNI, MRA, or SGLT2 inhibitor given renal function. - Continue to monitor daily weights, strict I/Os, and renal function. - Plan is for transfer to [facility] for Advanced CHF involvement and right/ left cardiac catheterization (LHC if renal function improves).  Chest Pain  Coronary Artery Calcifications  Prior chest CTA in 01/2023 showed  extensive coronary artery calcifications. Patient has been complaining of intermittent chest pain this admission. Most recent EKG on 3/3 shows normal sinus rhythm with RBBB and deep T wave waves in leads V1-V4 more pronounced than prior EKGs. Echo showed EF was worsened as above and there is apical akinesis. She was started on an IV Nitro drip with only minimal improvement.  - No recurrent chest pain.  - Continue IV Nitro for now. - Not on aspirin given need for full anticoagulation. - Continue statin/ Zetia. - Plan is to continue diuresis and then consider R/LHC once more euvolemic if renal function will allow.   PVCs - Continue Mexiletine 250mg  twice daily.  Hypertension BP well controlled.  - Continue IV Nitro and IV Lasix as above.  Hyperlipidemia - Continue Lipitor 40mg  daily and Zetia 10mg  daily.  AKI on CKD Stage III Recent baseline creatinine around 2.0 to 2.2 after admission in 07/2023. Creatinine 3.7 on admission. - Very slow downtrend in creatinine with diuresis. Creatinine 3.06 today. - Continue to monitor closely.   Otherwise, per primary team: - History of PE/ DVT: on chronic anticoagulation with Eliquis - Obstructive sleep apnea/ obesity hypoventilation syndrome: refuses CPAP - Type 2 diabetes mellitus - Hypothyroidism - Tobacco abuse - Super morbid obesity: BMI 70.9   For questions or updates, please contact [facility] Please consult [email or web address] for contact info under        Signed, Corrin Parker, PA-C  09/27/2023, 7:04 AM    ADDENDUM: Discussed with Dr. Royann Shivers. Will stop IV Lasix and switch to IV Bumex 2mg  every 8 hours. Will give another dose of Metolazone 5mg  as well.  Corrin Parker, PA-C 09/27/2023 12:31 PM   I have seen and examined the patient along with Corrin Parker, PA-C.  I have reviewed the chart, notes and new data.  I agree with PA/NP's note.  Key new complaints: dyspnea with minimal movement,  but no new chest  pain Key examination changes: severe edema, lungs clear anteriorly, rare ectopy. Mediocre UO despite metolazone and furosemide drip at 10 mg/h Key new findings / data: unchanged renal parameters  PLAN: Cardiogenic shock, on pressor support. Concerned that we are heading down pathway to cardiorenal syndrome, surprising with her decent mixed venous O2 suggesting fair cardiac output and good BP (on previous similar admission last month she required midodrine for BP support). Try bumetanide instead of furosemide and add acetazolamide. Awaiting transfer to [facility] and cath.  CRITICAL CARE Performed by: Rachelle Hora Haji Delaine   Total critical care time: 45 minutes  Critical care time was exclusive of separately billable procedures and treating other patients.  Critical care was necessary to treat or prevent imminent or life-threatening deterioration.  Critical care was time spent personally by me on the following activities: development of treatment plan with patient and/or surrogate as well as nursing, discussions with consultants, evaluation of patient's response to treatment, examination of patient, obtaining history from patient or surrogate, ordering and performing treatments and interventions, ordering and review of laboratory studies, ordering and review of radiographic studies, pulse oximetry and re-evaluation of patient's condition.   Thurmon Fair, MD, [facility] [phone] 09/27/2023, 12:50 PM

## 2023-09-28 DIAGNOSIS — I5023 Acute on chronic systolic (congestive) heart failure: Secondary | ICD-10-CM | POA: Diagnosis not present

## 2023-09-28 LAB — GLUCOSE, CAPILLARY
Glucose-Capillary: 95 mg/dL (ref 70–99)
Glucose-Capillary: 102 mg/dL — ABNORMAL HIGH (ref 70–99)
Glucose-Capillary: 111 mg/dL — ABNORMAL HIGH (ref 70–99)
Glucose-Capillary: 113 mg/dL — ABNORMAL HIGH (ref 70–99)

## 2023-09-28 LAB — MAGNESIUM: Magnesium: 2.1 mg/dL (ref 1.7–2.4)

## 2023-09-28 LAB — BASIC METABOLIC PANEL
Anion gap: 10 (ref 5–15)
BUN: 75 mg/dL — ABNORMAL HIGH (ref 6–20)
CO2: 27 mmol/L (ref 22–32)
Calcium: 8.2 mg/dL — ABNORMAL LOW (ref 8.9–10.3)
Chloride: 94 mmol/L — ABNORMAL LOW (ref 98–111)
Creatinine, Ser: 3.01 mg/dL — ABNORMAL HIGH (ref 0.44–1.00)
GFR, Estimated: 19 mL/min — ABNORMAL LOW (ref >60)
Glucose, Bld: 100 mg/dL — ABNORMAL HIGH (ref 70–99)
Potassium: 3.5 mmol/L (ref 3.5–5.1)
Sodium: 131 mmol/L — ABNORMAL LOW (ref 135–145)

## 2023-09-28 LAB — COOXEMETRY PANEL
Carboxyhemoglobin: 1.9 % — ABNORMAL HIGH (ref 0.5–1.5)
Methemoglobin: <0.7 % (ref 0.0–1.5)
O2 Saturation: 66.4 %
Total hemoglobin: 9.9 g/dL — ABNORMAL LOW (ref 12.0–16.0)

## 2023-09-28 LAB — MRSA NEXT GEN BY PCR, NASAL: MRSA by PCR Next Gen: NOT DETECTED

## 2023-09-28 MED ORDER — POTASSIUM CHLORIDE CRYS ER 20 MEQ PO TBCR
40.0000 meq | EXTENDED_RELEASE_TABLET | ORAL | Status: AC
  Administered 2023-09-28 (×2): 40 meq via ORAL
  Filled 2023-09-28 (×2): qty 2

## 2023-09-28 MED ORDER — FUROSEMIDE 10 MG/ML IJ SOLN
40.0000 mg/h | INTRAVENOUS | Status: DC
Start: 2023-09-28 — End: 2023-10-11
  Administered 2023-09-28 – 2023-10-08 (×37): 30 mg/h via INTRAVENOUS
  Administered 2023-10-09: 40 mg/h via INTRAVENOUS
  Administered 2023-10-09: 30 mg/h via INTRAVENOUS
  Administered 2023-10-10 – 2023-10-11 (×7): 40 mg/h via INTRAVENOUS
  Filled 2023-09-28 (×49): qty 20

## 2023-09-28 MED ORDER — GERHARDT'S BUTT CREAM
TOPICAL_CREAM | Freq: Three times a day (TID) | CUTANEOUS | Status: DC | PRN
  Filled 2023-09-28 (×2): qty 60

## 2023-09-28 MED ORDER — AQUAPHOR EX OINT
TOPICAL_OINTMENT | CUTANEOUS | Status: DC | PRN
  Filled 2023-09-28: qty 50

## 2023-09-28 MED ORDER — ACETAZOLAMIDE 250 MG PO TABS
250.0000 mg | ORAL_TABLET | Freq: Two times a day (BID) | ORAL | Status: DC
Start: 2023-09-28 — End: 2023-10-04
  Administered 2023-09-28 – 2023-10-03 (×12): 250 mg via ORAL
  Filled 2023-09-28 (×13): qty 1

## 2023-09-28 MED ORDER — METOLAZONE 2.5 MG PO TABS
5.0000 mg | ORAL_TABLET | Freq: Two times a day (BID) | ORAL | Status: DC
  Administered 2023-09-28 – 2023-10-04 (×13): 5 mg via ORAL
  Filled 2023-09-28 (×13): qty 2

## 2023-09-28 NOTE — Progress Notes (Signed)
 Patient Name: Karen Bennett Date of Encounter: 09/28/2023 [facility] Cardiologist: Chilton Si, MD   Interval Summary  .    - No chest pain.  Not much improvement in edema.  Short of breath with minimal activity in bed, but does not have shortness of breath at rest. - Slight improvement in urine output on combination bumetanide/metolazone/acetazolamide, compared to intravenous furosemide infusion at 10 mg/h and metolazone the day before. -Still roughly 70 pounds above previous discharge weight. -Slight improvement in hyponatremia and BUN/creatinine level -Mixed venous O2 sat 66%, lower than the previous days despite continued IV milrinone  Vital Signs .    Vitals:   09/27/23 2228 09/28/23 0333 09/28/23 0347 09/28/23 0653  BP: (!) 103/57 (!) 96/54  (!) 114/57  Pulse: 100 100 100   Resp: 14 20    Temp: 98.6 F (37 C) 98.8 F (37.1 C)  98.6 F (37 C)  TempSrc: Oral Oral  Oral  SpO2: 90% 93% 94%   Weight:  (!) 195.7 kg    Height:        Intake/Output Summary (Last 24 hours) at 09/28/2023 0836 Last data filed at 09/28/2023 0546 Gross per 24 hour  Intake 902.98 ml  Output 1650 ml  Net -747.02 ml      09/28/2023    3:33 AM 09/27/2023   10:23 PM 09/27/2023    6:30 AM  Last 3 Weights  Weight (lbs) 431 lb 8 oz 435 lb 6.5 oz 439 lb 8 oz  Weight (kg) 195.727 kg 197.5 kg 199.356 kg      Telemetry/ECG    Mild sinus tachycardia with occasional PACs and occasional PVCs- Personally Reviewed  Physical Exam .   GEN: No acute distress.   Neck: Very challenging to see JVD Cardiac: Occasional ectopy on background of RRR, no murmurs, rubs, or gallops.  Respiratory: Clear to auscultation bilaterally. GI: Soft, nontender, non-distended  MS: Severe generalized edema  Assessment & Plan .     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  Similar presentation with January 2025 with cardiogenic shock and anasarca, roughly 70 pound weight gain compared with discharge 08/21/2023 (discharge weight 370 pounds).  She did not undergo cardiac catheterization during the previous admission, she returns with further reduction in LVEF and new regional wall motion abnormalities concerning for LAD ischemia versus stress cardiomyopathy.  She has heavy coronary calcification on CT images, especially in the LAD artery distribution.  During her previous admission she required midodrine for blood pressure support in addition to intravenous milrinone but had brisk diuresis.  Low blood pressure has been less of a problem during this admission, but her response to diuretics has been mediocre despite using combination of loop diuretic/thiazide/acetazolamide.  Was on a furosemide drip at 10 mg/hour plus metolazone with very meager diuresis, seemed to do a little better on the combination of bumetanide/metolazone/acetazolamide yesterday.  Will ask the advanced heart failure service to see her.  I think she should have cardiac catheterization  during this admission, once her weight is less than the Cath Lab table maximum weight.  For questions or updates, please contact [facility] Please consult [email or web address] for contact info under        Signed, Thurmon Fair, MD

## 2023-09-28 NOTE — Consult Note (Addendum)
 Advanced Heart Failure Team Consult Note   Primary Physician: [name], NP Cardiologist:  Chilton Si, MD  Reason for Consultation: A/C Biventricular HFrEF  HPI:    Karen Bennett is seen today for evaluation of A/C HFrEF at the request of Dr Royann Shivers.    Karen Bennett is a PFO, PE, DVT, pulmonary hypertension, CKD Stage IIIa, hypothyroidism, MV endocarditis 2022, chronic biventricular heart failure.   Had TEE 2024 EF 45-50% RV mildly reduced.   Admitted in January with marked volume overload and newly reduced EF 35-40%, previously preserved EF. Placed on milrinone to augment diuresis. Effectively diuresed with lasix drip 30 mg /hour+ diamox 250 mg + metolazone 5 mg bid. Over diuresed 17 liters. CVP down to 5. Transitioned to  torsemide 80 mg twice a day. GDMT limited by hypotension. Placed on midodrine. Discharge weight 370 pounds.   She "No Showed"  for post hospital heart failure follow up due severe panic attacks. Lives alone and has Aides 7 days a week. Uses wheel chair in her home. Spends majority of her time in the bed. Says she was taking all medications but has not way to know if her weight is going up.   Readmitted 09/22/23 with marked volume overload. CXR with with pulmonary edema. Creatinine 3.3, BNP 572 . Cardiology consulted. Diuresing with bumex, diamox, and metolazone.Milrinone added. Overall weight down 14 pounds.  Transferred from [facility] to [facility] for HF Team to follow and for possible heart cath.    Home Medications Prior to Admission medications   Medication Sig Start Date End Date Taking? Authorizing Provider  apixaban (ELIQUIS) 5 MG TABS tablet Take 1 tablet (5 mg total) by mouth 2 (two) times daily. 03/23/23  Yes Rhetta Mura, MD  atorvastatin (LIPITOR) 40 MG tablet Take 40 mg by mouth every evening.   Yes [provider]  ezetimibe (ZETIA) 10 MG tablet Take 10 mg by mouth daily.   Yes [provider]  hydrOXYzine (VISTARIL) 50 MG  capsule Take 50 mg by mouth 2 (two) times daily as needed for itching or anxiety.   Yes [provider]  insulin lispro (HUMALOG) 100 UNIT/ML injection Inject 1-6 Units into the skin See admin instructions. Inject 1-6 units into the skin three times a day with meals, PER SLIDING SCALE   Yes [provider]  JARDIANCE 25 MG TABS tablet Take 25 mg by mouth daily. 09/04/23  Yes [provider]  LANTUS SOLOSTAR 100 UNIT/ML Solostar Pen Inject 10 Units into the skin at bedtime.   Yes [provider]  levothyroxine (SYNTHROID) 75 MCG tablet Take 75 mcg by mouth daily before breakfast.   Yes [provider]  methocarbamol (ROBAXIN) 750 MG tablet Take 750 mg by mouth every 8 (eight) hours as needed for muscle spasms. 07/09/23  Yes [provider]  mexiletine (MEXITIL) 250 MG capsule Take 1 capsule (250 mg total) by mouth every 12 (twelve) hours. 08/21/23  Yes Arrien, York Ram, MD  minoxidil (LONITEN) 10 MG tablet Take 5 mg by mouth daily. 09/15/23  Yes [provider]  MOUNJARO 2.5 MG/0.5ML Pen Inject 2.5 mg into the skin every Wednesday.   Yes [provider]  oxyCODONE (ROXICODONE) 15 MG immediate release tablet Take 15 mg by mouth every 6 (six) hours as needed. 09/18/23 09/28/23 Yes [provider]  potassium chloride (KLOR-CON) 10 MEQ tablet Take 10 mEq by mouth 2 (two) times daily.   Yes [provider]  promethazine (PHENERGAN) 25  MG tablet Take 25 mg by mouth every 6 (six) hours as needed for nausea or vomiting.   Yes [provider]  spironolactone (ALDACTONE) 25 MG tablet Take 12.5 mg by mouth daily.   Yes [provider]  torsemide (DEMADEX) 20 MG tablet Take 80 mg by mouth daily.   Yes [provider]  traZODone (DESYREL) 100 MG tablet Take 100 mg by mouth at bedtime.   Yes [provider]  venlafaxine XR (EFFEXOR-XR) 75 MG 24 hr capsule Take 75 mg by mouth daily with  breakfast.   Yes [provider]  atorvastatin (LIPITOR) 80 MG tablet Take 1 tablet (80 mg total) by mouth daily at 6 PM. 08/21/23 09/20/23  Arrien, York Ram, MD  Continuous Glucose Sensor (FREESTYLE LIBRE 3 SENSOR) MISC Place 1 sensor on the skin every 14 days. Use to check glucose continuously 03/23/23   Rhetta Mura, MD  ezetimibe (ZETIA) 10 MG tablet Take 1 tablet (10 mg total) by mouth daily. 08/22/23 09/21/23  Arrien, York Ram, MD  glucose blood test strip Use as instructed 06/26/13   Doris Cheadle, MD  glucose monitoring kit (FREESTYLE) monitoring kit 1 each by Does not apply route as needed for other. 06/26/13   Doris Cheadle, MD  spironolactone (ALDACTONE) 25 MG tablet Take 0.5 tablets (12.5 mg total) by mouth daily. 08/22/23 09/21/23  Arrien, York Ram, MD  torsemide (DEMADEX) 20 MG tablet Take 4 tablets (80 mg total) by mouth 2 (two) times daily. 08/21/23 09/20/23  Coralie Keens, MD    Past Medical History: Past Medical History:  Diagnosis Date   Anxiety and depression 06/26/2013   Cellulitis    Chronic kidney disease, stage 3a (HCC) 01/11/2023   Diabetes mellitus    Hyperlipidemia associated with type 2 diabetes mellitus (HCC) 05/02/2013   Hypertension associated with diabetes (HCC) 12/27/2020   Hypothyroidism    Infective endocarditis    PE (pulmonary embolism) ~2007-2008   Not on anticoagulation    Past Surgical History: Past Surgical History:  Procedure Laterality Date   Abscess removal from L groin     APPLICATION OF WOUND VAC Left 05/01/2013   Procedure: APPLICATION OF WOUND VAC;  Surgeon: Kerrin Champagne, MD;  Location: [facility];  Service: Orthopedics;  Laterality: Left;   BELOW KNEE LEG AMPUTATION Right    CYSTOSCOPY W/ URETERAL STENT PLACEMENT Right 09/03/2021   Procedure: CYSTOSCOPY WITH RETROGRADE PYELOGRAM/URETERAL STENT PLACEMENT;  Surgeon: Marcine Matar, MD;  Location: [facility];  Service: Urology;  Laterality: Right;   I  & D EXTREMITY  11/12/2011   Procedure: IRRIGATION AND DEBRIDEMENT EXTREMITY;  Surgeon: Kathryne Hitch, MD;  Location: [facility];  Service: Orthopedics;  Laterality: Left;  foot left   I & D EXTREMITY Left 09/06/2012   Procedure: IRRIGATION AND DEBRIDEMENT EXTREMITY;  Surgeon: Eldred Manges, MD;  Location: [facility];  Service: Orthopedics;  Laterality: Left;   I & D EXTREMITY Left 05/01/2013   Procedure: INCISION AND DRAINAGE LEFT FOREFOOT ABCESS ;  Surgeon: Kerrin Champagne, MD;  Location: [facility];  Service: Orthopedics;  Laterality: Left;   INCISION AND DRAINAGE ABSCESS Left 12/27/2020   Procedure: INCISION AND DRAINAGE LEFT THIGH ABSCESS;  Surgeon: Berna Bue, MD;  Location: [facility];  Service: General;  Laterality: Left;   IR FLUORO GUIDE CV LINE RIGHT  09/12/2021   IR FLUORO GUIDE CV LINE RIGHT  09/24/2023   IR PTA VENOUS EXCEPT DIALYSIS CIRCUIT  01/04/2021   IR RADIOLOGIST EVAL &  MGMT  10/25/2021   IR US GUIDE VASC ACCESS RIGHT  09/12/2021   IR US GUIDE VASC ACCESS RIGHT  08/10/2023   IR VENO/EXT/UNI RIGHT  01/04/2021   Surgery to remove hematoma in L leg     TEE WITHOUT CARDIOVERSION N/A 01/03/2021   Procedure: TRANSESOPHAGEAL ECHOCARDIOGRAM (TEE);  Surgeon: Quintella Reichert, MD;  Location[facility] ENDOSCOPY;  Service: Cardiovascular;  Laterality: N/A;   TEE WITHOUT CARDIOVERSION N/A 03/23/2023   Procedure: TRANSESOPHAGEAL ECHOCARDIOGRAM;  Surgeon: [name], MD;  Location: [facility] INVASIVE CV LAB;  Service: Cardiovascular;  Laterality: N/A;   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    Family History: Family History  Problem Relation Age of Onset   Diabetes Mother    Coronary artery disease Mother    Stroke Mother     Social History: Social History   Socioeconomic History   Marital status: Single    Spouse name: Not on file   Number of children: Not on file   Years of education: Not on file   Highest education level: Not on file  Occupational History   Not on file   Tobacco Use   Smoking status: Some Days    Types: Cigarettes    Passive exposure: Never   Smokeless tobacco: Never  Vaping Use   Vaping status: Never Used  Substance and Sexual Activity   Alcohol use: No   Drug use: No   Sexual activity: Never  Other Topics Concern   Not on file  Social History Narrative   Lives at home on her own and is ambulatory, is single and has no children, no family who checks up on her or helps her, and only one friend who helps with daily activities when she can. She is a never smoker.    Social Drivers of Corporate investment banker Strain: Low Risk  (09/07/2023)   Received from [facility]   Overall Financial Resource Strain (CARDIA)    Difficulty of Paying Living Expenses: Not hard at all  Food Insecurity: No Food Insecurity (09/22/2023)   Hunger Vital Sign    Worried About Running Out of Food in the Last Year: Never true    Ran Out of Food in the Last Year: Never true  Transportation Needs: No Transportation Needs (09/22/2023)   PRAPARE - Administrator, Civil Service (Medical): No    Lack of Transportation (Non-Medical): No  Physical Activity: Sufficiently Active (05/07/2023)   Received from [facility]   Exercise Vital Sign    Days of Exercise per Week: 4 days    Minutes of Exercise per Session: 60 min  Stress: Stress Concern Present (05/07/2023)   Received from [facility] - Occupational Stress Questionnaire    Feeling of Stress : To some extent  Social Connections: Moderately Integrated (05/07/2023)   Received from [facility]   Social Network    How would you rate your social network (family, work, friends)?: Adequate participation with social networks    Allergies:  Allergies  Allergen Reactions   Chlorhexidine Dermatitis and Rash    ALLERGY TO CHG WIPES   Clindamycin Diarrhea and Nausea And Vomiting   Zofran Itching and Nausea And Vomiting   Doxycycline Diarrhea and  Nausea And Vomiting   Morphine And Codeine Hives, Itching and Rash    Objective:    Vital Signs:   Temp:  [97.8 F (36.6 C)-100.2 F (37.9 C)]  98.6 F (37 C) (03/07 0653) Pulse Rate:  [96-111] 100 (03/07 0347) Resp:  [13-23] 20 (03/07 0333) BP: (96-124)/(32-73) 114/57 (03/07 0653) SpO2:  [85 %-94 %] 94 % (03/07 0347) Weight:  [195.7 kg-197.5 kg] 195.7 kg (03/07 0333) Last BM Date : 09/26/23  Weight change: Filed Weights   09/27/23 0630 09/27/23 2223 09/28/23 0333  Weight: (!) 199.4 kg (!) 197.5 kg (!) 195.7 kg    Intake/Output:   Intake/Output Summary (Last 24 hours) at 09/28/2023 0946 Last data filed at 09/28/2023 0546 Gross per 24 hour  Intake 828.46 ml  Output 1650 ml  Net -821.54 ml     CVP 14-15  Physical Exam   General:   No resp difficulty Neck: supple. JVP to jaw   Cor: PMI nondisplaced. Regular rate & rhythm. No rubs, gallops or murmurs. Lungs: clear Abdomen: soft, nontender, nondistended.  Extremities: no cyanosis, clubbing, rash, LLE 1+ edema. RBKA  1+ edema  Neuro: alert & oriented x3   Telemetry   SR with PVCs.   EKG      Labs   Basic Metabolic Panel: Recent Labs  Lab 09/23/23 0534 09/24/23 0535 09/25/23 0600 09/26/23 0505 09/27/23 0710 09/28/23 0340  NA 131* 131* 127* 129* 130* 131*  K 5.2* 5.1 4.4 4.0 3.8 3.5  CL 96* 96* 93* 94* 94* 94*  CO2 25 24 22 25 24 27   GLUCOSE 84 99 156* 80 85 100*  BUN 79* 78* 76* 76* 78* 75*  CREATININE 3.23* 3.35* 3.26* 3.16* 3.06* 3.01*  CALCIUM 8.7* 8.5* 8.1* 8.2* 8.3* 8.2*  MG 2.4 2.4 2.3 2.2 2.0 2.1  PHOS 7.6*  --   --  7.1*  --   --     Liver Function Tests: Recent Labs  Lab 09/22/23 0656 09/23/23 0534 09/24/23 0535 09/26/23 0505  AST 9* 11* 8* 9*  ALT 9 8 8 8   ALKPHOS 63 67 70 65  BILITOT 0.9 0.9 1.0 1.1  PROT 7.4 7.3 7.3 6.8  ALBUMIN 2.9* 2.8* 2.8* 2.7*   No results for input(s): "LIPASE", "AMYLASE" in the last 168 hours. No results for input(s): "AMMONIA" in the last 168  hours.  CBC: Recent Labs  Lab 09/22/23 0215 09/22/23 0220 09/23/23 0534 09/24/23 0535 09/25/23 0600 09/26/23 0505  WBC 5.6  --  4.4 5.3 5.5 3.8*  NEUTROABS 3.7  --   --   --   --   --   HGB 11.0* 12.2 11.4* 11.2* 10.0* 10.0*  HCT 35.9* 36.0 38.1 36.8 32.5* 33.2*  MCV 87.6  --  89.6 89.3 89.0 90.0  PLT 248  --  225 234 198 188    Cardiac Enzymes: No results for input(s): "CKTOTAL", "CKMB", "CKMBINDEX", "TROPONINI" in the last 168 hours.  BNP: BNP (last 3 results) Recent Labs    02/09/23 1736 08/01/23 1140 09/22/23 0215  BNP 345.1* 523.3* 572.3*    ProBNP (last 3 results) No results for input(s): "PROBNP" in the last 8760 hours.   CBG: Recent Labs  Lab 09/27/23 0741 09/27/23 1154 09/27/23 1511 09/27/23 2133 09/28/23 0602  GLUCAP 86 87 93 75 95    Coagulation Studies: No results for input(s): "LABPROT", "INR" in the last 72 hours.   Imaging   No results found.   Medications:     Current Medications:  apixaban  5 mg Oral BID   atorvastatin  40 mg Oral QPM   bumetanide (BUMEX) IV  2 mg Intravenous Q8H   Chlorhexidine Gluconate Cloth  6  each Topical Daily   clotrimazole  1 Application Topical BID   ezetimibe  10 mg Oral Daily   insulin aspart  0-15 Units Subcutaneous TID WC   insulin aspart  0-5 Units Subcutaneous QHS   insulin glargine  10 Units Subcutaneous QHS   levothyroxine  75 mcg Oral QAC breakfast   mexiletine  250 mg Oral Q12H   sodium chloride flush  10-40 mL Intracatheter Q12H    Infusions:  milrinone 0.25 mcg/kg/min (09/28/23 0744)   nitroGLYCERIN Stopped (09/26/23 1140)   promethazine (PHENERGAN) injection (IM or IVPB) Stopped (09/28/23 0442)      Patient Profile    Admitted with A/C Biventricular Heart Failure   Assessment/Plan   1. A/C Biventricular HF EF continues to drop. Previous Echo EF 35-40% EF but now 25-30% + RV failure. Etiology unclear. ? PVC induced? She has not had ischemic work up due to weight/CKD.   Readmitted with marked volume overload. Poor response with intermittent IV lasix and IV bumex. Milrinone added to augment diuresis.  I am going to switch diuretics to the regimen from most recent admit given the response. Start lasix drip 30 mg per hour, metolazone 5 mg twice a day, and diamox 250 mg twice a day.   Could consider cath once diuresed but creatinine 3. Currently not having chest pain. Continue milrinone 0.25 mcg. CO-OX stable.  GDMT limited by hypotension.   2. CKD  Stage IV on AKI  Creatinine baseline ~ 2 ---> today 3 Follow daily BMET. Avoid hypotension.   3. Hypothyroidism  Recent TSH stable.   4. PVCs  Continue mexiletine 250 every 12 hours.   5. H/O PE/DVT Continue eliquis 5 mg bid.   6.Suspected OHS/OSA Refuses sleep study  7. Severe Morbid Obesity.  DMII. HGb A1C 7.2 Consider GLP1 as an outpatient.  Body mass index is 69.65 kg/m.  8. Right BKA  9. T2DM - Hgb of 7.2 (improved from 12.2 in 2023) - schedule SSI  Length of Stay: 6  Amy Clegg, NP  09/28/2023, 9:46 AM  Advanced Heart Failure Team Pager [phone] (M-F; 7a - 5p)  Please contact [facility] for night-coverage after hours (4p -7a ) and weekends on [email or web address]  Patient seen with APP, plan extensively discussed.   Subjective: - Laying flat in bed; reports that she is very short of breath however improving dyspnea   Exam: Blood pressure 119/61, pulse 100, temperature 98.9 F (37.2 C), temperature source Oral, resp. rate 17, height 5\' 6"  (1.676 m), weight (!) 195.7 kg, last menstrual period 04/13/2013, SpO2 (!) 89%.  GENERAL: chronically ill appearing WF laying in bed Lungs- decreased bilaterally with normal work of breathing CARDIAC:  JVP: difficult to assess due to body habitus. Tachycardic; +4 edema in LLE ABDOMEN: soft EXTREMITIES: warm with 4+ edema NEUROLOGIC: No obvious FND  A/P Karen Bennett is a [name] with CKD stage III, hypothyroidism, mitral valve endocarditis in 2022,  history of CVA, right BKA, severe morbid obesity (BMI 70) and chronic biventricular failure currently admitted with heart failure exacerbation.  She is well-known to the heart failure service.  Admitted in January with severe volume overload requiring milrinone and Lasix gtt. at 30 mg/h.  Unfortunately she did not follow-up for her posthospitalization outpatient clinic visit.  She was readmitted on September 22, 2023 with severe volume overload, serum creatinine of 3.3 and elevated BNP.  Due to difficulty with diuresis patient was transferred to [facility] from [place] [facility] for advanced heart failure evaluation.  On my exam today, patient severely volume overloaded with 4+ pitting edema to the thighs.  CVP elevated to 15.  -Start IV Lasix at 30 mg/h, metolazone 5 mg twice daily and Diamox 250 mg twice daily. -Continue milrinone 0.25 mcg/kg/min. -Mixed venous stable at 66% -Patient will require a lengthy hospitalization I suspect she is at least 20 pounds volume overloaded at the minimum.   Tylena Prisk Advanced Heart Failure

## 2023-09-28 NOTE — Progress Notes (Signed)
 PROGRESS NOTE    Karen Bennett  MWN:[id number] DOB: 04/30/1976 DOA: 09/22/2023 PCP: [name], NP     Brief Narrative:  48 year old female with history of morbid obesity, chronic systolic CHF, HTN, HLD, IDDM, hypothyroidism, prior DVT/PE on Eliquis, history of Candida mitral valve endocarditis in 2022, pulmonary hypertension, chronic "pulmonary, CKD 3B, right BKA, prior CVA, who comes into the hospital with worsening abdominal distention, lower extremity swelling and shortness of breath.  She was recently hospitalized and discharged in January 2025 for acute on chronic systolic CHF, required significant diuresis with midodrine augmentation and seen by advanced heart failure service.  Since discharge she has been doing well, compliant with her home medications as well as a low-sodium diet, but over the last several days-a week she has noticed increased swelling, decreased urination, and worsening shortness of breath.  She is unsure whether she gained any weight that she cannot weigh herself.  In the ED she was found to have acute kidney injury, hyperkalemia, and imaging and exam was consistent with fluid overload.  Cardiology was consulted and patient was transferred to [facility] for further evaluation by advanced heart failure team and consideration for heart cath.  New events last 24 hours / Subjective: Patient denies any new complaints on examination.  Remains volume overloaded.  Assessment & Plan:   Principal Problem:   Acute on chronic HFrEF (heart failure with reduced ejection fraction) (HCC) Active Problems:   Diabetic peripheral neuropathy associated with type 2 diabetes mellitus (HCC)   Pulmonary emboli (HCC)   Acute kidney injury superimposed on stage 3a chronic kidney disease (HCC)   Hyponatremia   History of pulmonary embolism   Insulin dependent type 2 diabetes mellitus (HCC)   Hyperlipidemia associated with type 2 diabetes mellitus (HCC)   Hx of right BKA  (HCC)   Hypothyroidism   BMI 60.0-69.9, adult (HCC)   History of DVT (deep vein thrombosis)   Acute on chronic combined CHF, biventricular heart failure  -Recent hospitalization in January 2025 with cardiogenic shock, anasarca.  Discharge weight at that time was 270 pounds. -Appreciate cardiology following, advanced heart failure team to consult -Continue IV Lasix drip, metolazone, Diamox, milrinone   Chest pain with known coronary artery calcifications  -Patient denies any chest pain today.  Cardiology considering heart cath   Acute kidney injury on chronic kidney disease stage IIIb  -Baseline creatinine 2.2 -Creatinine 3.01 today, continue to trend   Vasculopath Difficult access -Have had significant difficulties with obtaining central access given size, and ability to lay flat as well as known right-sided subclavian stenosis.  After discussing with the PICC line team, PCCM, IR, she eventually had a left-sided PICC placed by PICC team -Due to significant difficulties with central access, when she is volume optimized she may benefit from a port prior to discharge.  This was discussed with the patient this morning and she is in agreement given significant difficulties that she encountered in January as well.  Contact IR for port placement 1 to 2 days prior to discharge   Hyperlipidemia -Lipitor, Zetia   Hypervolemic hyponatremia -Continue diuresis   Anemia of chronic kidney disease -Stable   Type 2 diabetes mellitus, poorly controlled with hyperglycemia -Continue Lantus, sliding scale insulin  History of PE/DVT -Eliquis  PVCs -Mexiletine  Hypothyroidism -Synthroid  Morbid obesity  -Estimated body mass index is 69.65 kg/m as calculated from the following:   Height as of this encounter: 5\' 6"  (1.676 m).   Weight as of this  encounter: 195.7 kg.   DVT prophylaxis:  apixaban (ELIQUIS) tablet 5 mg  Code Status: Full code Family Communication: None at the  bedside Disposition Plan: Home Status is: Inpatient Remains inpatient appropriate because: Continue IV treatments, heart failure team to consult today    Antimicrobials:  Anti-infectives (From admission, onward)    None        Objective: Vitals:   09/28/23 0333 09/28/23 0347 09/28/23 0653 09/28/23 1228  BP: (!) 96/54  (!) 114/57 119/61  Pulse: 100 100  100  Resp: 20   17  Temp: 98.8 F (37.1 C)  98.6 F (37 C) 98.9 F (37.2 C)  TempSrc: Oral  Oral Oral  SpO2: 93% 94%  (!) 89%  Weight: (!) 195.7 kg     Height:        Intake/Output Summary (Last 24 hours) at 09/28/2023 1331 Last data filed at 09/28/2023 1230 Gross per 24 hour  Intake 1501.1 ml  Output 2250 ml  Net -748.9 ml   Filed Weights   09/27/23 0630 09/27/23 2223 09/28/23 0333  Weight: (!) 199.4 kg (!) 197.5 kg (!) 195.7 kg    Examination:  General exam: Appears calm and comfortable  Respiratory system: Clear to auscultation anteriorly without respiratory distress Cardiovascular system: S1 & S2 heard, RRR Gastrointestinal system: Abdomen is nondistended, soft Central nervous system: Alert and oriented. No focal neurological deficits. Speech clear.  Extremities: Right BKA Psychiatry: Judgement and insight appear normal. Mood & affect appropriate.   Data Reviewed: I have personally reviewed following labs and imaging studies  CBC: Recent Labs  Lab 09/22/23 0215 09/22/23 0220 09/23/23 0534 09/24/23 0535 09/25/23 0600 09/26/23 0505  WBC 5.6  --  4.4 5.3 5.5 3.8*  NEUTROABS 3.7  --   --   --   --   --   HGB 11.0* 12.2 11.4* 11.2* 10.0* 10.0*  HCT 35.9* 36.0 38.1 36.8 32.5* 33.2*  MCV 87.6  --  89.6 89.3 89.0 90.0  PLT 248  --  225 234 198 188   Basic Metabolic Panel: Recent Labs  Lab 09/23/23 0534 09/24/23 0535 09/25/23 0600 09/26/23 0505 09/27/23 0710 09/28/23 0340  NA 131* 131* 127* 129* 130* 131*  K 5.2* 5.1 4.4 4.0 3.8 3.5  CL 96* 96* 93* 94* 94* 94*  CO2 25 24 22 25 24 27   GLUCOSE 84  99 156* 80 85 100*  BUN 79* 78* 76* 76* 78* 75*  CREATININE 3.23* 3.35* 3.26* 3.16* 3.06* 3.01*  CALCIUM 8.7* 8.5* 8.1* 8.2* 8.3* 8.2*  MG 2.4 2.4 2.3 2.2 2.0 2.1  PHOS 7.6*  --   --  7.1*  --   --    GFR: Estimated Creatinine Clearance: 42 mL/min (A) (by C-G formula based on SCr of 3.01 mg/dL (H)). Liver Function Tests: Recent Labs  Lab 09/22/23 0656 09/23/23 0534 09/24/23 0535 09/26/23 0505  AST 9* 11* 8* 9*  ALT 9 8 8 8   ALKPHOS 63 67 70 65  BILITOT 0.9 0.9 1.0 1.1  PROT 7.4 7.3 7.3 6.8  ALBUMIN 2.9* 2.8* 2.8* 2.7*   No results for input(s): "LIPASE", "AMYLASE" in the last 168 hours. No results for input(s): "AMMONIA" in the last 168 hours. Coagulation Profile: No results for input(s): "INR", "PROTIME" in the last 168 hours. Cardiac Enzymes: No results for input(s): "CKTOTAL", "CKMB", "CKMBINDEX", "TROPONINI" in the last 168 hours. BNP (last 3 results) No results for input(s): "PROBNP" in the last 8760 hours. HbA1C: No results for input(s): "HGBA1C"  in the last 72 hours. CBG: Recent Labs  Lab 09/27/23 1154 09/27/23 1511 09/27/23 2133 09/28/23 0602 09/28/23 1229  GLUCAP 87 93 75 95 102*   Lipid Profile: No results for input(s): "CHOL", "HDL", "LDLCALC", "TRIG", "CHOLHDL", "LDLDIRECT" in the last 72 hours. Thyroid Function Tests: No results for input(s): "TSH", "T4TOTAL", "FREET4", "T3FREE", "THYROIDAB" in the last 72 hours. Anemia Panel: No results for input(s): "VITAMINB12", "FOLATE", "FERRITIN", "TIBC", "IRON", "RETICCTPCT" in the last 72 hours. Sepsis Labs: Recent Labs  Lab 09/22/23 5366  LATICACIDVEN 0.7    Recent Results (from the past 240 hours)  MRSA Next Gen by PCR, Nasal     Status: Abnormal   Collection Time: 09/22/23 10:06 PM   Specimen: Nasal Mucosa; Nasal Swab  Result Value Ref Range Status   MRSA by PCR Next Gen DETECTED (A) NOT DETECTED Final    Comment: RESULT CALLED TO, READ BACK BY AND VERIFIED WITH: SIMON,K AT 0429 ON 09/23/23 BY  LUZOLOP (NOTE) The GeneXpert MRSA Assay (FDA approved for NASAL specimens only), is one component of a comprehensive MRSA colonization surveillance program. It is not intended to diagnose MRSA infection nor to guide or monitor treatment for MRSA infections. Test performance is not FDA approved in patients less than 60 years old. Performed at [facility], [address], [place], [place] [address]   MRSA Next Gen by PCR, Nasal     Status: None   Collection Time: 09/24/23  4:49 PM   Specimen: Nasal Mucosa; Nasal Swab  Result Value Ref Range Status   MRSA by PCR Next Gen NOT DETECTED NOT DETECTED Final    Comment: (NOTE) The GeneXpert MRSA Assay (FDA approved for NASAL specimens only), is one component of a comprehensive MRSA colonization surveillance program. It is not intended to diagnose MRSA infection nor to guide or monitor treatment for MRSA infections. Test performance is not FDA approved in patients less than 60 years old. Performed at [facility], [address], [place], [place] [address]   MRSA Next Gen by PCR, Nasal     Status: None   Collection Time: 09/27/23 10:54 PM   Specimen: Nasal Mucosa; Nasal Swab  Result Value Ref Range Status   MRSA by PCR Next Gen NOT DETECTED NOT DETECTED Final    Comment: (NOTE) The GeneXpert MRSA Assay (FDA approved for NASAL specimens only), is one component of a comprehensive MRSA colonization surveillance program. It is not intended to diagnose MRSA infection nor to guide or monitor treatment for MRSA infections. Test performance is not FDA approved in patients less than 30 years old. Performed at [facility], [address] [place], [place] [address]       Radiology Studies: No results found.    Scheduled Meds:  acetaZOLAMIDE  250 mg Oral BID   apixaban  5 mg Oral BID   atorvastatin  40 mg Oral QPM   clotrimazole  1 Application Topical BID   ezetimibe  10 mg Oral Daily    insulin aspart  0-15 Units Subcutaneous TID WC   insulin aspart  0-5 Units Subcutaneous QHS   insulin glargine  10 Units Subcutaneous QHS   levothyroxine  75 mcg Oral QAC breakfast   metolazone  5 mg Oral BID   mexiletine  250 mg Oral Q12H   potassium chloride  40 mEq Oral Q4H   sodium chloride flush  10-40 mL Intracatheter Q12H   Continuous Infusions:  furosemide (LASIX) 200 mg in dextrose 5 %  100 mL (2 mg/mL) infusion 30 mg/hr (09/28/23 1135)   milrinone 0.25 mcg/kg/min (09/28/23 1118)   promethazine (PHENERGAN) injection (IM or IVPB) Stopped (09/28/23 0442)     LOS: 6 days   Time spent: 40 minutes   Noralee Stain, DO [facility] 09/28/2023, 1:31 PM   Available via Epic secure chat 7am-7pm After these hours, please refer to coverage provider listed on [email or web address]

## 2023-09-28 NOTE — Plan of Care (Signed)
  Problem: Education: Goal: Knowledge of General Education information will improve Description: Including pain rating scale, medication(s)/side effects and non-pharmacologic comfort measures Outcome: Progressing   Problem: Pain Managment: Goal: General experience of comfort will improve and/or be controlled Outcome: Progressing

## 2023-09-29 DIAGNOSIS — I5023 Acute on chronic systolic (congestive) heart failure: Secondary | ICD-10-CM | POA: Diagnosis not present

## 2023-09-29 LAB — BASIC METABOLIC PANEL
Anion gap: 10 (ref 5–15)
BUN: 73 mg/dL — ABNORMAL HIGH (ref 6–20)
CO2: 23 mmol/L (ref 22–32)
Calcium: 7.7 mg/dL — ABNORMAL LOW (ref 8.9–10.3)
Chloride: 92 mmol/L — ABNORMAL LOW (ref 98–111)
Creatinine, Ser: 2.9 mg/dL — ABNORMAL HIGH (ref 0.44–1.00)
GFR, Estimated: 20 mL/min — ABNORMAL LOW (ref >60)
Glucose, Bld: 218 mg/dL — ABNORMAL HIGH (ref 70–99)
Potassium: 3.2 mmol/L — ABNORMAL LOW (ref 3.5–5.1)
Sodium: 125 mmol/L — ABNORMAL LOW (ref 135–145)

## 2023-09-29 LAB — GLUCOSE, CAPILLARY
Glucose-Capillary: 95 mg/dL (ref 70–99)
Glucose-Capillary: 106 mg/dL — ABNORMAL HIGH (ref 70–99)
Glucose-Capillary: 139 mg/dL — ABNORMAL HIGH (ref 70–99)
Glucose-Capillary: 106 mg/dL — ABNORMAL HIGH (ref 70–99)

## 2023-09-29 LAB — COOXEMETRY PANEL
Carboxyhemoglobin: 1.4 % (ref 0.5–1.5)
Methemoglobin: <0.7 % (ref 0.0–1.5)
O2 Saturation: 72.7 %
Total hemoglobin: 9.3 g/dL — ABNORMAL LOW (ref 12.0–16.0)

## 2023-09-29 LAB — MAGNESIUM: Magnesium: 2 mg/dL (ref 1.7–2.4)

## 2023-09-29 MED ORDER — POTASSIUM CHLORIDE CRYS ER 20 MEQ PO TBCR
40.0000 meq | EXTENDED_RELEASE_TABLET | ORAL | Status: AC
  Administered 2023-09-29 (×2): 40 meq via ORAL
  Filled 2023-09-29 (×2): qty 2

## 2023-09-29 NOTE — Progress Notes (Signed)
 PROGRESS NOTE    Karen Bennett  WUJ:[id number] DOB: [birth year] DOA: 09/22/2023 PCP: [name], NP     Brief Narrative:  48 year old female with history of morbid obesity, chronic systolic CHF, HTN, HLD, IDDM, hypothyroidism, prior DVT/PE on Eliquis, history of Candida mitral valve endocarditis in 2022, pulmonary hypertension, chronic "pulmonary, CKD 3B, right BKA, prior CVA, who comes into the hospital with worsening abdominal distention, lower extremity swelling and shortness of breath.  She was recently hospitalized and discharged in January 2025 for acute on chronic systolic CHF, required significant diuresis with midodrine augmentation and seen by advanced heart failure service.  Since discharge she has been doing well, compliant with her home medications as well as a low-sodium diet, but over the last several days-a week she has noticed increased swelling, decreased urination, and worsening shortness of breath.  She is unsure whether she gained any weight that she cannot weigh herself.  In the ED she was found to have acute kidney injury, hyperkalemia, and imaging and exam was consistent with fluid overload.  Cardiology was consulted and patient was transferred to [facility] for further evaluation by advanced heart failure team and consideration for heart cath.  New events last 24 hours / Subjective: No new issues, remains volume overloaded  Assessment & Plan:   Principal Problem:   Acute on chronic HFrEF (heart failure with reduced ejection fraction) (HCC) Active Problems:   Diabetic peripheral neuropathy associated with type 2 diabetes mellitus (HCC)   Pulmonary emboli (HCC)   Acute kidney injury superimposed on stage 3a chronic kidney disease (HCC)   Hyponatremia   History of pulmonary embolism   Insulin dependent type 2 diabetes mellitus (HCC)   Hyperlipidemia associated with type 2 diabetes mellitus (HCC)   Hx of right BKA (HCC)   Hypothyroidism   BMI 60.0-69.9,  adult (HCC)   History of DVT (deep vein thrombosis)   Acute on chronic combined CHF, biventricular heart failure  -Recent hospitalization in January 2025 with cardiogenic shock, anasarca.  Discharge weight at that time was 270 pounds. -Appreciate cardiology following, advanced heart failure team to consult -Continue IV Lasix drip, metolazone, Diamox, milrinone -Strict I's and O's   Chest pain with known coronary artery calcifications  -Patient with some intermittent right-sided chest pain.  Cardiology considering heart cath   Acute kidney injury on chronic kidney disease stage IIIb  -Baseline creatinine 2.2 -Creatinine 2.9 today, continue to trend   Vasculopath Difficult access -Have had significant difficulties with obtaining central access given size, and ability to lay flat as well as known right-sided subclavian stenosis.  After discussing with the PICC line team, PCCM, IR, she eventually had a left-sided PICC placed by PICC team -Due to significant difficulties with central access, when she is volume optimized she may benefit from a port prior to discharge.  This was discussed with the patient this morning and she is in agreement given significant difficulties that she encountered in January as well.  Contact IR for port placement 1 to 2 days prior to discharge   Hyperlipidemia -Lipitor, Zetia   Hypervolemic hyponatremia -Continue diuresis, continue to trend   Anemia of chronic kidney disease -Stable   Type 2 diabetes mellitus, poorly controlled with hyperglycemia -Continue Lantus, sliding scale insulin  History of PE/DVT -Eliquis  PVCs -Mexiletine  Hypothyroidism -Synthroid  Morbid obesity  -Estimated body mass index is 68.6 kg/m as calculated from the following:   Height as of this encounter: 5\' 6"  (1.676 m).   Weight  as of this encounter: 192.8 kg.  Hypothyroidism -Replace  DVT prophylaxis:  apixaban (ELIQUIS) tablet 5 mg  Code Status: Full code Family  Communication: None at the bedside Disposition Plan: Home Status is: Inpatient Remains inpatient appropriate because: CHF   Antimicrobials:  Anti-infectives (From admission, onward)    None        Objective: Vitals:   09/29/23 0500 09/29/23 0538 09/29/23 0634 09/29/23 0805  BP: 113/60   (!) 106/59  Pulse: 100  (!) 104   Resp:   20   Temp: 99 F (37.2 C)   98 F (36.7 C)  TempSrc: Oral   Oral  SpO2:   94%   Weight:  (!) 192.8 kg    Height:        Intake/Output Summary (Last 24 hours) at 09/29/2023 1027 Last data filed at 09/29/2023 0600 Gross per 24 hour  Intake 1235.02 ml  Output 1675 ml  Net -439.98 ml   Filed Weights   09/27/23 2223 09/28/23 0333 09/29/23 0538  Weight: (!) 197.5 kg (!) 195.7 kg (!) 192.8 kg    Examination:  General exam: Appears calm and comfortable  Respiratory system: Clear to auscultation anteriorly without respiratory distress Cardiovascular system: S1 & S2 heard, tachycardic, regular rhythm Gastrointestinal system: Abdomen is nondistended, soft Central nervous system: Alert and oriented. No focal neurological deficits. Speech clear.  Extremities: Right BKA Psychiatry: Judgement and insight appear normal. Mood & affect appropriate.   Data Reviewed: I have personally reviewed following labs and imaging studies  CBC: Recent Labs  Lab 09/23/23 0534 09/24/23 0535 09/25/23 0600 09/26/23 0505  WBC 4.4 5.3 5.5 3.8*  HGB 11.4* 11.2* 10.0* 10.0*  HCT 38.1 36.8 32.5* 33.2*  MCV 89.6 89.3 89.0 90.0  PLT 225 234 198 188   Basic Metabolic Panel: Recent Labs  Lab 09/23/23 0534 09/24/23 0535 09/25/23 0600 09/26/23 0505 09/27/23 0710 09/28/23 0340 09/29/23 0535  NA 131*   < > 127* 129* 130* 131* 125*  K 5.2*   < > 4.4 4.0 3.8 3.5 3.2*  CL 96*   < > 93* 94* 94* 94* 92*  CO2 25   < > 22 25 24 27 23   GLUCOSE 84   < > 156* 80 85 100* 218*  BUN 79*   < > 76* 76* 78* 75* 73*  CREATININE 3.23*   < > 3.26* 3.16* 3.06* 3.01* 2.90*  CALCIUM  8.7*   < > 8.1* 8.2* 8.3* 8.2* 7.7*  MG 2.4   < > 2.3 2.2 2.0 2.1 2.0  PHOS 7.6*  --   --  7.1*  --   --   --    < > = values in this interval not displayed.   GFR: Estimated Creatinine Clearance: 43.1 mL/min (A) (by C-G formula based on SCr of 2.9 mg/dL (H)). Liver Function Tests: Recent Labs  Lab 09/23/23 0534 09/24/23 0535 09/26/23 0505  AST 11* 8* 9*  ALT 8 8 8   ALKPHOS 67 70 65  BILITOT 0.9 1.0 1.1  PROT 7.3 7.3 6.8  ALBUMIN 2.8* 2.8* 2.7*   No results for input(s): "LIPASE", "AMYLASE" in the last 168 hours. No results for input(s): "AMMONIA" in the last 168 hours. Coagulation Profile: No results for input(s): "INR", "PROTIME" in the last 168 hours. Cardiac Enzymes: No results for input(s): "CKTOTAL", "CKMB", "CKMBINDEX", "TROPONINI" in the last 168 hours. BNP (last 3 results) No results for input(s): "PROBNP" in the last 8760 hours. HbA1C: No results for input(s): "HGBA1C"  in the last 72 hours. CBG: Recent Labs  Lab 09/28/23 0602 09/28/23 1229 09/28/23 1640 09/28/23 2120 09/29/23 0614  GLUCAP 95 102* 111* 113* 95   Lipid Profile: No results for input(s): "CHOL", "HDL", "LDLCALC", "TRIG", "CHOLHDL", "LDLDIRECT" in the last 72 hours. Thyroid Function Tests: No results for input(s): "TSH", "T4TOTAL", "FREET4", "T3FREE", "THYROIDAB" in the last 72 hours. Anemia Panel: No results for input(s): "VITAMINB12", "FOLATE", "FERRITIN", "TIBC", "IRON", "RETICCTPCT" in the last 72 hours. Sepsis Labs: No results for input(s): "PROCALCITON", "LATICACIDVEN" in the last 168 hours.   Recent Results (from the past 240 hours)  MRSA Next Gen by PCR, Nasal     Status: Abnormal   Collection Time: 09/22/23 10:06 PM   Specimen: Nasal Mucosa; Nasal Swab  Result Value Ref Range Status   MRSA by PCR Next Gen DETECTED (A) NOT DETECTED Final    Comment: RESULT CALLED TO, READ BACK BY AND VERIFIED WITH: SIMON,K AT 0429 ON 09/23/23 BY LUZOLOP (NOTE) The GeneXpert MRSA Assay (FDA  approved for NASAL specimens only), is one component of a comprehensive MRSA colonization surveillance program. It is not intended to diagnose MRSA infection nor to guide or monitor treatment for MRSA infections. Test performance is not FDA approved in patients less than 56 years old. Performed at [facility], [address] [place], [place] [address]   MRSA Next Gen by PCR, Nasal     Status: None   Collection Time: 09/24/23  4:49 PM   Specimen: Nasal Mucosa; Nasal Swab  Result Value Ref Range Status   MRSA by PCR Next Gen NOT DETECTED NOT DETECTED Final    Comment: (NOTE) The GeneXpert MRSA Assay (FDA approved for NASAL specimens only), is one component of a comprehensive MRSA colonization surveillance program. It is not intended to diagnose MRSA infection nor to guide or monitor treatment for MRSA infections. Test performance is not FDA approved in patients less than 54 years old. Performed at [facility], [address] [place], [place] [address]   MRSA Next Gen by PCR, Nasal     Status: None   Collection Time: 09/27/23 10:54 PM   Specimen: Nasal Mucosa; Nasal Swab  Result Value Ref Range Status   MRSA by PCR Next Gen NOT DETECTED NOT DETECTED Final    Comment: (NOTE) The GeneXpert MRSA Assay (FDA approved for NASAL specimens only), is one component of a comprehensive MRSA colonization surveillance program. It is not intended to diagnose MRSA infection nor to guide or monitor treatment for MRSA infections. Test performance is not FDA approved in patients less than 11 years old. Performed at [facility], [address] [place], [place] [address]       Radiology Studies: No results found.    Scheduled Meds:  acetaZOLAMIDE  250 mg Oral BID   apixaban  5 mg Oral BID   atorvastatin  40 mg Oral QPM   clotrimazole  1 Application Topical BID   ezetimibe  10 mg Oral Daily   insulin aspart  0-15 Units Subcutaneous TID WC    insulin aspart  0-5 Units Subcutaneous QHS   insulin glargine  10 Units Subcutaneous QHS   levothyroxine  75 mcg Oral QAC breakfast   metolazone  5 mg Oral BID   mexiletine  250 mg Oral Q12H   potassium chloride  40 mEq Oral Q4H   sodium chloride flush  10-40 mL Intracatheter Q12H   Continuous Infusions:  furosemide (LASIX) 200 mg in dextrose  5 % 100 mL (2 mg/mL) infusion 30 mg/hr (09/29/23 0746)   milrinone 0.25 mcg/kg/min (09/29/23 0600)   promethazine (PHENERGAN) injection (IM or IVPB) Stopped (09/28/23 0442)     LOS: 7 days   Time spent: 35 minutes   Noralee Stain, DO [facility] 09/29/2023, 10:27 AM   Available via Epic secure chat 7am-7pm After these hours, please refer to coverage provider listed on [email or web address]

## 2023-09-29 NOTE — Progress Notes (Signed)
 Rounding Note    Patient Name: Karen Bennett Date of Encounter: 09/29/2023  [facility] Cardiologist: Chilton Si, MD   Subjective   Ongoing SOB  Inpatient Medications    Scheduled Meds:  acetaZOLAMIDE  250 mg Oral BID   apixaban  5 mg Oral BID   atorvastatin  40 mg Oral QPM   clotrimazole  1 Application Topical BID   ezetimibe  10 mg Oral Daily   insulin aspart  0-15 Units Subcutaneous TID WC   insulin aspart  0-5 Units Subcutaneous QHS   insulin glargine  10 Units Subcutaneous QHS   levothyroxine  75 mcg Oral QAC breakfast   metolazone  5 mg Oral BID   mexiletine  250 mg Oral Q12H   potassium chloride  40 mEq Oral Q4H   sodium chloride flush  10-40 mL Intracatheter Q12H   Continuous Infusions:  furosemide (LASIX) 200 mg in dextrose 5 % 100 mL (2 mg/mL) infusion 30 mg/hr (09/29/23 0746)   milrinone 0.25 mcg/kg/min (09/29/23 0600)   promethazine (PHENERGAN) injection (IM or IVPB) Stopped (09/28/23 0442)   PRN Meds: acetaminophen **OR** acetaminophen, Gerhardt's butt cream, hydrOXYzine, mineral oil-hydrophilic petrolatum, mouth rinse, oxyCODONE, prochlorperazine, promethazine (PHENERGAN) injection (IM or IVPB), sodium chloride flush   Vital Signs    Vitals:   09/29/23 0038 09/29/23 0500 09/29/23 0538 09/29/23 0634  BP:  113/60    Pulse: (!) 104 100  (!) 104  Resp: 14   20  Temp:  99 F (37.2 C)    TempSrc:  Oral    SpO2: 94%   94%  Weight:   (!) 192.8 kg   Height:        Intake/Output Summary (Last 24 hours) at 09/29/2023 0757 Last data filed at 09/29/2023 0600 Gross per 24 hour  Intake 1235.02 ml  Output 1675 ml  Net -439.98 ml      09/29/2023    5:38 AM 09/28/2023    3:33 AM 09/27/2023   10:23 PM  Last 3 Weights  Weight (lbs) 425 lb 431 lb 8 oz 435 lb 6.5 oz  Weight (kg) 192.779 kg 195.727 kg 197.5 kg      Telemetry    SR - Personally Reviewed  ECG    N/a - Personally Reviewed  Physical Exam   GEN: No acute distress.   Neck: +  JVD Cardiac: RRR, no murmurs, rubs, or gallops.  Respiratory: coarse bilaterally GI: Soft, nontender, non-distended  MS: 2+ LE edema Neuro:  Nonfocal  Psych: Normal affect   Labs    High Sensitivity Troponin:  No results for input(s): "TROPONINIHS" in the last 720 hours.   Chemistry Recent Labs  Lab 09/23/23 0534 09/24/23 0535 09/25/23 0600 09/26/23 0505 09/27/23 0710 09/28/23 0340 09/29/23 0535  NA 131* 131*   < > 129* 130* 131* 125*  K 5.2* 5.1   < > 4.0 3.8 3.5 3.2*  CL 96* 96*   < > 94* 94* 94* 92*  CO2 25 24   < > 25 24 27 23   GLUCOSE 84 99   < > 80 85 100* 218*  BUN 79* 78*   < > 76* 78* 75* 73*  CREATININE 3.23* 3.35*   < > 3.16* 3.06* 3.01* 2.90*  CALCIUM 8.7* 8.5*   < > 8.2* 8.3* 8.2* 7.7*  MG 2.4 2.4   < > 2.2 2.0 2.1 2.0  PROT 7.3 7.3  --  6.8  --   --   --  ALBUMIN 2.8* 2.8*  --  2.7*  --   --   --   AST 11* 8*  --  9*  --   --   --   ALT 8 8  --  8  --   --   --   ALKPHOS 67 70  --  65  --   --   --   BILITOT 0.9 1.0  --  1.1  --   --   --   GFRNONAA 17* 16*   < > 18* 18* 19* 20*  ANIONGAP 10 11   < > 10 12 10 10    < > = values in this interval not displayed.    Lipids No results for input(s): "CHOL", "TRIG", "HDL", "LABVLDL", "LDLCALC", "CHOLHDL" in the last 168 hours.  Hematology Recent Labs  Lab 09/24/23 0535 09/25/23 0600 09/26/23 0505  WBC 5.3 5.5 3.8*  RBC 4.12 3.65* 3.69*  HGB 11.2* 10.0* 10.0*  HCT 36.8 32.5* 33.2*  MCV 89.3 89.0 90.0  MCH 27.2 27.4 27.1  MCHC 30.4 30.8 30.1  RDW 14.3 14.1 14.0  PLT 234 198 188   Thyroid  Recent Labs  Lab 09/22/23 1607  TSH 4.243    BNPNo results for input(s): "BNP", "PROBNP" in the last 168 hours.  DDimer No results for input(s): "DDIMER" in the last 168 hours.   Radiology    No results found.  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   Assessment & Plan    Acute on chronic biventricular HF/ Severe pulmonary HTN -Jan 2025 echo: LVEF 35-40%, grade III dd - 09/2023 echo: LVEF 25-30%, apical akinesis, D shaped septum, mod RV dysfunction, severe RVE, , PASP 78 - BNP 572, CXR +HF with layering pleural effusions. Recent discharge wt 370 lbs, 445 lbs on admission - difficult diuresing this admission, started on IV milrineone.   - on milrinone 0.25, COOX 73% this AM. Most recent CVP was yesterday at 15. MAPs in the 70s - on lasix gtt at 30mg /hr, acetazolamide 250mg  bid, metolazone 5mg  bid. Negative per charting though bed weights report 6 lbs weight loss. Cr downtrending with diuresis consistent with venous congestion and HF. Downtrend in Na monitor with diuresis.   2. 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 PVCs - she is on mexilitine For questions or updates, please contact [facility] Please consult [email or web address] for contact info under        Signed, Dina Rich, MD  09/29/2023, 7:57 AM

## 2023-09-29 NOTE — Plan of Care (Signed)

## 2023-09-30 DIAGNOSIS — Z8673 Personal history of transient ischemic attack (TIA), and cerebral infarction without residual deficits: Secondary | ICD-10-CM

## 2023-09-30 DIAGNOSIS — N179 Acute kidney failure, unspecified: Secondary | ICD-10-CM

## 2023-09-30 DIAGNOSIS — N1831 Chronic kidney disease, stage 3a: Secondary | ICD-10-CM

## 2023-09-30 DIAGNOSIS — Z89511 Acquired absence of right leg below knee: Secondary | ICD-10-CM

## 2023-09-30 DIAGNOSIS — Z6841 Body Mass Index (BMI) 40.0 and over, adult: Secondary | ICD-10-CM

## 2023-09-30 DIAGNOSIS — E1169 Type 2 diabetes mellitus with other specified complication: Secondary | ICD-10-CM | POA: Diagnosis not present

## 2023-09-30 DIAGNOSIS — E039 Hypothyroidism, unspecified: Secondary | ICD-10-CM

## 2023-09-30 DIAGNOSIS — I5023 Acute on chronic systolic (congestive) heart failure: Secondary | ICD-10-CM | POA: Diagnosis not present

## 2023-09-30 DIAGNOSIS — Z86711 Personal history of pulmonary embolism: Secondary | ICD-10-CM

## 2023-09-30 DIAGNOSIS — E785 Hyperlipidemia, unspecified: Secondary | ICD-10-CM

## 2023-09-30 DIAGNOSIS — F419 Anxiety disorder, unspecified: Secondary | ICD-10-CM

## 2023-09-30 DIAGNOSIS — F32A Depression, unspecified: Secondary | ICD-10-CM

## 2023-09-30 LAB — BASIC METABOLIC PANEL
Anion gap: 14 (ref 5–15)
Anion gap: 13 (ref 5–15)
BUN: 73 mg/dL — ABNORMAL HIGH (ref 6–20)
BUN: 71 mg/dL — ABNORMAL HIGH (ref 6–20)
CO2: 23 mmol/L (ref 22–32)
CO2: 25 mmol/L (ref 22–32)
Calcium: 8 mg/dL — ABNORMAL LOW (ref 8.9–10.3)
Calcium: 8.2 mg/dL — ABNORMAL LOW (ref 8.9–10.3)
Chloride: 91 mmol/L — ABNORMAL LOW (ref 98–111)
Chloride: 93 mmol/L — ABNORMAL LOW (ref 98–111)
Creatinine, Ser: 2.78 mg/dL — ABNORMAL HIGH (ref 0.44–1.00)
Creatinine, Ser: 2.74 mg/dL — ABNORMAL HIGH (ref 0.44–1.00)
GFR, Estimated: 21 mL/min — ABNORMAL LOW (ref >60)
GFR, Estimated: 21 mL/min — ABNORMAL LOW (ref >60)
Glucose, Bld: 164 mg/dL — ABNORMAL HIGH (ref 70–99)
Glucose, Bld: 108 mg/dL — ABNORMAL HIGH (ref 70–99)
Potassium: 3.2 mmol/L — ABNORMAL LOW (ref 3.5–5.1)
Potassium: 3 mmol/L — ABNORMAL LOW (ref 3.5–5.1)
Sodium: 128 mmol/L — ABNORMAL LOW (ref 135–145)
Sodium: 131 mmol/L — ABNORMAL LOW (ref 135–145)

## 2023-09-30 LAB — GLUCOSE, CAPILLARY
Glucose-Capillary: 106 mg/dL — ABNORMAL HIGH (ref 70–99)
Glucose-Capillary: 100 mg/dL — ABNORMAL HIGH (ref 70–99)
Glucose-Capillary: 112 mg/dL — ABNORMAL HIGH (ref 70–99)
Glucose-Capillary: 121 mg/dL — ABNORMAL HIGH (ref 70–99)

## 2023-09-30 LAB — MAGNESIUM: Magnesium: 2 mg/dL (ref 1.7–2.4)

## 2023-09-30 LAB — COOXEMETRY PANEL
Carboxyhemoglobin: 1.6 % — ABNORMAL HIGH (ref 0.5–1.5)
Methemoglobin: <0.7 % (ref 0.0–1.5)
O2 Saturation: 71.7 %
Total hemoglobin: 9.9 g/dL — ABNORMAL LOW (ref 12.0–16.0)

## 2023-09-30 LAB — CBC
HCT: 29.9 % — ABNORMAL LOW (ref 36.0–46.0)
Hemoglobin: 9.5 g/dL — ABNORMAL LOW (ref 12.0–15.0)
MCH: 27.4 pg (ref 26.0–34.0)
MCHC: 31.8 g/dL (ref 30.0–36.0)
MCV: 86.2 fL (ref 80.0–100.0)
Platelets: 164 10*3/uL (ref 150–400)
RBC: 3.47 MIL/uL — ABNORMAL LOW (ref 3.87–5.11)
RDW: 13.9 % (ref 11.5–15.5)
WBC: 3.8 10*3/uL — ABNORMAL LOW (ref 4.0–10.5)
nRBC: 0 % (ref 0.0–0.2)

## 2023-09-30 MED ORDER — POTASSIUM CHLORIDE CRYS ER 20 MEQ PO TBCR: 40.0000 meq | EXTENDED_RELEASE_TABLET | Freq: Three times a day (TID) | ORAL | Status: DC

## 2023-09-30 MED ORDER — POTASSIUM CHLORIDE CRYS ER 20 MEQ PO TBCR
40.0000 meq | EXTENDED_RELEASE_TABLET | ORAL | Status: AC
  Administered 2023-09-30 (×3): 40 meq via ORAL
  Filled 2023-09-30 (×3): qty 2

## 2023-09-30 MED ORDER — PROCHLORPERAZINE MALEATE 10 MG PO TABS
10.0000 mg | ORAL_TABLET | Freq: Four times a day (QID) | ORAL | Status: DC | PRN
  Administered 2023-09-30 – 2023-10-05 (×5): 10 mg via ORAL
  Filled 2023-09-30 (×7): qty 1

## 2023-09-30 NOTE — Assessment & Plan Note (Signed)
Continue blood pressure control and statin therapy.  Follow up with vascular surgery as outpatient. Currently patient with no claudication. Smoking cessation counseling.

## 2023-09-30 NOTE — Progress Notes (Signed)
 Progress Note   Patient: Karen Bennett:[id number] DOB: 01-Mar-1976 DOA: 09/22/2023     8 DOS: the patient was seen and examined on 09/30/2023   Brief hospital course: Mrs. Streeter was admitted to the hospital with the working diagnosis of heart failure decompensation.   48 year old female with history of obesity class 3, chronic systolic CHF, HTN, HLD, IDDM, hypothyroidism, prior DVT/PE, history of Candida mitral valve endocarditis in 2022, pulmonary hypertension, CKD 3B, right BKA, prior CVA, who comes into the hospital with worsening abdominal distention, lower extremity swelling and shortness of breath.   She was recently hospitalized and discharged in January 2025 for acute on chronic systolic CHF, required significant diuresis with milrinone.  Since discharge she has been doing well, compliant with her home medications as well as a low-sodium diet, but over the last several days-a week she has noticed increased swelling, decreased urination, and worsening shortness of breath..  In the ED she was found to have volume overload, acute kidney injury, and hyperkalemia.   Patient was placed on IV furosemide, and inotropic support with milrinone.  03/07 transferred to [facility] from [facility] for advance heart failure management.      Assessment and Plan: * Acute on chronic systolic heart failure (HCC) Echocardiogram with reduced LV systolic function EF 25 to 30%, severe LV cavity dilatation, interventricular septum flattened in systole, RV systolic function with moderate reduction, RVSP 78.2 RA with mild dilatation,  LV entire apex akinetic.  No significant valvular disease.   Acute on chronic core pulmonale Pulmonary hypertension  RV failure.   Urine output 2,500 ml Systolic blood pressure 100 mmHg range.   Inotropic support with milrinone 0,25 mcg/kg/min  Plan to continue aggressive diuresis with furosemide drip 30 ml per hr.  Acetazolamide and metolazone.   No SGLT 2 inh due to risk of  urine and perineal skin infections.  Holding RAAS due to acute worsening in GFR.   Frequent PVC, continue with mexiletine.   Acute kidney injury superimposed on stage 3a chronic kidney disease (HCC) Hyponatremia, hypokalemia.   Renal function with serum cr at 2,78 with K at 3,2 and serum bicarbonate at 23.  Na 128 and Mg 2.0   Plan for K correction with Kcl 40 meq x 3 doses, follow up renal function this evening.  Keep K 4 and Mg at 2.  Continue diuresis with furosemide, acetazolamide and metolazone.   History of pulmonary embolism Continue anticoagulation with apixaban.   Type 2 diabetes mellitus with hyperlipidemia (HCC) Continue glucose cover and monitoring with insulin sliding scale.  Basal insulin with 10 units glargine.   Continue with ezetimibe and atorvastatin.   Hx of right BKA (HCC) PT and OT as tolerated.   Hypothyroidism Continue with levothyroxine   BMI 60.0-69.9, adult (HCC) Calculated BMI is 70.1   History of CVA (cerebrovascular accident) Continue blood pressure control.   Anxiety and depression Continue with as needed hydroxyzine        Subjective: patient continue to have dyspnea and edema, improved but not back to her baseline. No chest pain, no nausea or vomiting   Physical Exam: Vitals:   09/29/23 1953 09/29/23 2316 09/30/23 0310 09/30/23 0810  BP: 126/68 121/69 114/63 98/79  Pulse: 96 (!) 101 (!) 103 92  Resp: 17 16 17 19   Temp: 98.1 F (36.7 C) 97.8 F (36.6 C) 98.7 F (37.1 C) 97.6 F (36.4 C)  TempSrc: Oral Oral Oral Oral  SpO2: 96% 95% 95% 95%  Weight:   Marland Kitchen)  197.2 kg   Height:       Neurology awake and alert ENT with mild pallor Cardiovascular with S1 and S2 present, positive S3 gallop with no rubs or murmurs No JVD (wide neck) Positive lower extremity edema +++ pitting, positive right BKA Respiratory with bilateral rales at the dependent zone Abdomen with no distention, protuberant  Positive right BKA  Data  Reviewed:    Family Communication: no family at the bedside   Disposition: Status is: Inpatient Remains inpatient appropriate because: IV diuresis and inotropic support   Planned Discharge Destination: Home     Author: Coralie Keens, MD 09/30/2023 10:39 AM  For on call review [email or web address].

## 2023-09-30 NOTE — Assessment & Plan Note (Signed)
 Continue with as needed hydroxyzine

## 2023-09-30 NOTE — Progress Notes (Signed)
 Rounding Note    Patient Name: Karen Bennett Date of Encounter: 09/30/2023  [facility] Cardiologist: Chilton Si, MD   Subjective   Ongoing SOB  Inpatient Medications    Scheduled Meds:  acetaZOLAMIDE  250 mg Oral BID   apixaban  5 mg Oral BID   atorvastatin  40 mg Oral QPM   clotrimazole  1 Application Topical BID   ezetimibe  10 mg Oral Daily   insulin aspart  0-15 Units Subcutaneous TID WC   insulin aspart  0-5 Units Subcutaneous QHS   insulin glargine  10 Units Subcutaneous QHS   levothyroxine  75 mcg Oral QAC breakfast   metolazone  5 mg Oral BID   mexiletine  250 mg Oral Q12H   sodium chloride flush  10-40 mL Intracatheter Q12H   Continuous Infusions:  furosemide (LASIX) 200 mg in dextrose 5 % 100 mL (2 mg/mL) infusion 30 mg/hr (09/30/23 0541)   milrinone 0.25 mcg/kg/min (09/30/23 0950)   promethazine (PHENERGAN) injection (IM or IVPB) Stopped (09/30/23 0040)   PRN Meds: acetaminophen **OR** acetaminophen, Gerhardt's butt cream, hydrOXYzine, mineral oil-hydrophilic petrolatum, mouth rinse, oxyCODONE, prochlorperazine, promethazine (PHENERGAN) injection (IM or IVPB), sodium chloride flush   Vital Signs    Vitals:   09/29/23 1953 09/29/23 2316 09/30/23 0310 09/30/23 0810  BP: 126/68 121/69 114/63 98/79  Pulse: 96 (!) 101 (!) 103 92  Resp: 17 16 17 19   Temp: 98.1 F (36.7 C) 97.8 F (36.6 C) 98.7 F (37.1 C) 97.6 F (36.4 C)  TempSrc: Oral Oral Oral Oral  SpO2: 96% 95% 95% 95%  Weight:   (!) 197.2 kg   Height:        Intake/Output Summary (Last 24 hours) at 09/30/2023 1046 Last data filed at 09/30/2023 0813 Gross per 24 hour  Intake 120 ml  Output 2150 ml  Net -2030 ml      09/30/2023    3:10 AM 09/29/2023    5:38 AM 09/28/2023    3:33 AM  Last 3 Weights  Weight (lbs) 434 lb 12 oz 425 lb 431 lb 8 oz  Weight (kg) 197.2 kg 192.779 kg 195.727 kg      Telemetry    Sinus rhythm and sinus tach - Personally Reviewed  ECG    N/a -  Personally Reviewed  Physical Exam   GEN: No acute distress.   Neck: + JVD Cardiac: RRR, no murmurs, rubs, or gallops.  Respiratory: decreased breath sounds bilateral bases GI: Soft, nontender, non-distended  MS: 2+ LE edema Neuro:  Nonfocal  Psych: Normal affect   Labs    High Sensitivity Troponin:  No results for input(s): "TROPONINIHS" in the last 720 hours.   Chemistry Recent Labs  Lab 09/24/23 0535 09/25/23 0600 09/26/23 0505 09/27/23 0710 09/28/23 0340 09/29/23 0535 09/30/23 0455  NA 131*   < > 129*   < > 131* 125* 128*  K 5.1   < > 4.0   < > 3.5 3.2* 3.2*  CL 96*   < > 94*   < > 94* 92* 91*  CO2 24   < > 25   < > 27 23 23   GLUCOSE 99   < > 80   < > 100* 218* 164*  BUN 78*   < > 76*   < > 75* 73* 73*  CREATININE 3.35*   < > 3.16*   < > 3.01* 2.90* 2.78*  CALCIUM 8.5*   < > 8.2*   < >  8.2* 7.7* 8.0*  MG 2.4   < > 2.2   < > 2.1 2.0 2.0  PROT 7.3  --  6.8  --   --   --   --   ALBUMIN 2.8*  --  2.7*  --   --   --   --   AST 8*  --  9*  --   --   --   --   ALT 8  --  8  --   --   --   --   ALKPHOS 70  --  65  --   --   --   --   BILITOT 1.0  --  1.1  --   --   --   --   GFRNONAA 16*   < > 18*   < > 19* 20* 21*  ANIONGAP 11   < > 10   < > 10 10 14    < > = values in this interval not displayed.    Lipids No results for input(s): "CHOL", "TRIG", "HDL", "LABVLDL", "LDLCALC", "CHOLHDL" in the last 168 hours.  Hematology Recent Labs  Lab 09/25/23 0600 09/26/23 0505 09/30/23 0455  WBC 5.5 3.8* 3.8*  RBC 3.65* 3.69* 3.47*  HGB 10.0* 10.0* 9.5*  HCT 32.5* 33.2* 29.9*  MCV 89.0 90.0 86.2  MCH 27.4 27.1 27.4  MCHC 30.8 30.1 31.8  RDW 14.1 14.0 13.9  PLT 198 188 164   Thyroid No results for input(s): "TSH", "FREET4" in the last 168 hours.  BNPNo results for input(s): "BNP", "PROBNP" in the last 168 hours.  DDimer No results for input(s): "DDIMER" in the last 168 hours.   Radiology    No results found.  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   Assessment & Plan    Acute on chronic biventricular HF/ Severe pulmonary HTN -Jan 2025 echo: LVEF 35-40%, grade III dd - 09/2023 echo: LVEF 25-30%, apical akinesis, D shaped septum, mod RV dysfunction, severe RVE, , PASP 78 - BNP 572, CXR +HF with layering pleural effusions. Recent discharge wt 370 lbs, 445 lbs on admission - difficult diuresing this admission, started on IV milrinone.    - on milrinone 0.25, COOX 72% this AM. CVP 14. MAPs in the 70s-80s - on lasix gtt at 30mg /hr, acetazolamide 250mg  bid, metolazone 5mg  bid. Negative 2.3 L yesterday, total I/Os incomplete for admit. WEights appear inaccurage. Cr downtrending with diuresis consistent with venous congestion and HF.  - continue IV diuresis today    2. 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 PVCs - she is on mexilitine  For questions or updates, please contact [facility] Please consult [email or web address] for contact info under        Signed, Dina Rich, MD  09/30/2023, 10:46 AM

## 2023-09-30 NOTE — Assessment & Plan Note (Signed)
 Continue with levothyroxine

## 2023-09-30 NOTE — Plan of Care (Signed)

## 2023-09-30 NOTE — Assessment & Plan Note (Addendum)
 Continue glucose cover and monitoring with insulin sliding scale.  Basal insulin with 10 units glargine.  Fasting glucose today 164 mg   Continue with ezetimibe and atorvastatin.

## 2023-09-30 NOTE — Assessment & Plan Note (Addendum)
 Echocardiogram with reduced LV systolic function EF 25 to 30%, severe LV cavity dilatation, interventricular septum flattened in systole, RV systolic function with moderate reduction, RVSP 78.2 RA with mild dilatation,  LV entire apex akinetic.  No significant valvular disease.   Acute on chronic core pulmonale Pulmonary hypertension  RV failure.   Urine output 2,875 ml Systolic blood pressure 100 mmHg range.   Inotropic support with milrinone 0,25 mcg/kg/min  Furosemide drip 30 ml per hr.  Acetazolamide and metolazone 5 mg tid.   No SGLT 2 inh due to risk of urine and perineal skin infections.  Patient has a foley cathter in place.  Holding RAAS due to acute worsening in GFR.   Frequent PVC, continue with mexiletine.

## 2023-09-30 NOTE — Hospital Course (Addendum)
 Mrs. Finnie was admitted to the hospital with the working diagnosis of heart failure decompensation.   48 year old female with history of obesity class 3, chronic systolic CHF, HTN, HLD, IDDM, hypothyroidism, prior DVT/PE, history of Candida mitral valve endocarditis in 2022, pulmonary hypertension, CKD 3B, right BKA, prior CVA, who comes into the hospital with worsening abdominal distention, lower extremity swelling and shortness of breath.   She was recently hospitalized and discharged in January 2025 for acute on chronic systolic CHF, required significant diuresis with milrinone.  Since discharge she has been doing well, compliant with her home medications as well as a low-sodium diet, but over the last several days-a week she has noticed increased swelling, decreased urination, and worsening shortness of breath..  In the ED she was found to have volume overload, acute kidney injury, and hyperkalemia.   Patient was placed on IV furosemide, and inotropic support with milrinone.  03/07 transferred to [facility] from [facility] for advance heart failure management.   Started on furosemide drip for further diuresis.   03/11 continue volume overload, not back to baseline.

## 2023-09-30 NOTE — Assessment & Plan Note (Signed)
 Calculated BMI is 70.1

## 2023-09-30 NOTE — Assessment & Plan Note (Signed)
 Hyponatremia, hypokalemia.   Renal function with serum cr at 2,78 with K at 3,2 and serum bicarbonate at 23.  Na 128 and Mg 2.0   Plan for K correction with Kcl 40 meq x 3 doses, follow up renal function this evening.  Keep K 4 and Mg at 2.  Continue diuresis with furosemide, acetazolamide and metolazone.

## 2023-09-30 NOTE — Assessment & Plan Note (Signed)
 PT and OT as tolerated.

## 2023-09-30 NOTE — Assessment & Plan Note (Signed)
Continue anticoagulation with apixaban.  ?

## 2023-10-01 DIAGNOSIS — N179 Acute kidney failure, unspecified: Secondary | ICD-10-CM | POA: Diagnosis not present

## 2023-10-01 DIAGNOSIS — Z86711 Personal history of pulmonary embolism: Secondary | ICD-10-CM | POA: Diagnosis not present

## 2023-10-01 DIAGNOSIS — I5023 Acute on chronic systolic (congestive) heart failure: Secondary | ICD-10-CM | POA: Diagnosis not present

## 2023-10-01 DIAGNOSIS — E1169 Type 2 diabetes mellitus with other specified complication: Secondary | ICD-10-CM | POA: Diagnosis not present

## 2023-10-01 LAB — BASIC METABOLIC PANEL
Anion gap: 11 (ref 5–15)
Anion gap: 10 (ref 5–15)
BUN: 68 mg/dL — ABNORMAL HIGH (ref 6–20)
BUN: 73 mg/dL — ABNORMAL HIGH (ref 6–20)
CO2: 25 mmol/L (ref 22–32)
CO2: 27 mmol/L (ref 22–32)
Calcium: 7.9 mg/dL — ABNORMAL LOW (ref 8.9–10.3)
Calcium: 8.1 mg/dL — ABNORMAL LOW (ref 8.9–10.3)
Chloride: 93 mmol/L — ABNORMAL LOW (ref 98–111)
Chloride: 93 mmol/L — ABNORMAL LOW (ref 98–111)
Creatinine, Ser: 2.72 mg/dL — ABNORMAL HIGH (ref 0.44–1.00)
Creatinine, Ser: 2.74 mg/dL — ABNORMAL HIGH (ref 0.44–1.00)
GFR, Estimated: 21 mL/min — ABNORMAL LOW (ref >60)
GFR, Estimated: 21 mL/min — ABNORMAL LOW (ref >60)
Glucose, Bld: 164 mg/dL — ABNORMAL HIGH (ref 70–99)
Glucose, Bld: 115 mg/dL — ABNORMAL HIGH (ref 70–99)
Potassium: 3.2 mmol/L — ABNORMAL LOW (ref 3.5–5.1)
Potassium: 3.3 mmol/L — ABNORMAL LOW (ref 3.5–5.1)
Sodium: 129 mmol/L — ABNORMAL LOW (ref 135–145)
Sodium: 130 mmol/L — ABNORMAL LOW (ref 135–145)

## 2023-10-01 LAB — GLUCOSE, CAPILLARY
Glucose-Capillary: 132 mg/dL — ABNORMAL HIGH (ref 70–99)
Glucose-Capillary: 112 mg/dL — ABNORMAL HIGH (ref 70–99)
Glucose-Capillary: 116 mg/dL — ABNORMAL HIGH (ref 70–99)
Glucose-Capillary: 124 mg/dL — ABNORMAL HIGH (ref 70–99)

## 2023-10-01 LAB — COOXEMETRY PANEL
Carboxyhemoglobin: 1.5 % (ref 0.5–1.5)
Methemoglobin: 1 % (ref 0.0–1.5)
O2 Saturation: 72.7 %
Total hemoglobin: 10.5 g/dL — ABNORMAL LOW (ref 12.0–16.0)

## 2023-10-01 MED ORDER — POTASSIUM CHLORIDE 20 MEQ PO PACK
20.0000 meq | PACK | Freq: Once | ORAL | Status: AC
  Administered 2023-10-01: 20 meq via ORAL
  Filled 2023-10-01: qty 1

## 2023-10-01 MED ORDER — POTASSIUM CHLORIDE CRYS ER 20 MEQ PO TBCR
40.0000 meq | EXTENDED_RELEASE_TABLET | ORAL | Status: AC
  Administered 2023-10-01: 20 meq via ORAL
  Administered 2023-10-01 (×2): 40 meq via ORAL
  Filled 2023-10-01 (×3): qty 2

## 2023-10-01 NOTE — Plan of Care (Signed)
  Problem: Nutritional: Goal: Maintenance of adequate nutrition will improve Outcome: Progressing   Problem: Nutrition: Goal: Adequate nutrition will be maintained Outcome: Progressing   Problem: Coping: Goal: Level of anxiety will decrease Outcome: Progressing   Problem: Safety: Goal: Ability to remain free from injury will improve Outcome: Progressing

## 2023-10-01 NOTE — Progress Notes (Addendum)
 Advanced Heart Failure Rounding Note  Cardiologist: Chilton Si, MD  Chief Complaint: acute on chronic systolic heart failure Subjective:    On milrinone 0.25 + Lasix gtt at 30/hr. Co-ox 73%.   2.3L in UOP yesterday. CVP 8 but c/w marked volume overload/3rd spacing.   SCr overall down from admit and has plateaued at 2.7 (admit 3.2)  Resting comfortably. Denies subjective dyspnea but continues on [place].   Objective:   Weight Range: (!) 198.9 kg Body mass index is 70.77 kg/m.   Vital Signs:   Temp:  [97.6 F (36.4 C)-98.1 F (36.7 C)] 98.1 F (36.7 C) (03/10 0752) Pulse Rate:  [98-103] 103 (03/10 0752) Resp:  [19-20] 20 (03/10 0752) BP: (116-141)/(48-75) 116/60 (03/10 0752) SpO2:  [94 %-97 %] 96 % (03/10 0752) Weight:  [198.9 kg] 198.9 kg (03/10 0510) Last BM Date : 09/30/23  Weight change: Filed Weights   09/29/23 0538 09/30/23 0310 10/01/23 0510  Weight: (!) 192.8 kg (!) 197.2 kg (!) 198.9 kg    Intake/Output:   Intake/Output Summary (Last 24 hours) at 10/01/2023 1049 Last data filed at 10/01/2023 0500 Gross per 24 hour  Intake 360 ml  Output 2275 ml  Net -1915 ml      Physical Exam    General:  super morbi obesity, no distress  HEENT: normal  Neck: thick neck, JVD not well visualized. Carotids 2+ bilat; no bruits. No lymphadenopathy or thyromegaly appreciated. Cor: PMI nondisplaced. Regular rhythm and tachy rate  Lungs: decreased BS at the bases bilaterally  Abdomen: obese, nontender, distended.  Extremities: No cyanosis, clubbing, rash, 2+ LLE edema, Rt BKA, + RUE PICC  Neuro: Alert & orientedx3, cranial nerves grossly intact. Affect pleasant   Telemetry   Sinus tach 110s, personally reviewed   EKG    No new EKG to review  Labs    CBC Recent Labs    09/30/23 0455  WBC 3.8*  HGB 9.5*  HCT 29.9*  MCV 86.2  PLT 164   Basic Metabolic Panel Recent Labs    13/24/40 0535 09/30/23 0455 09/30/23 1702 10/01/23 0416  NA 125* 128*  131* 129*  K 3.2* 3.2* 3.0* 3.2*  CL 92* 91* 93* 93*  CO2 23 23 25 25   GLUCOSE 218* 164* 108* 164*  BUN 73* 73* 71* 68*  CREATININE 2.90* 2.78* 2.74* 2.72*  CALCIUM 7.7* 8.0* 8.2* 7.9*  MG 2.0 2.0  --   --    Liver Function Tests No results for input(s): "AST", "ALT", "ALKPHOS", "BILITOT", "PROT", "ALBUMIN" in the last 72 hours. No results for input(s): "LIPASE", "AMYLASE" in the last 72 hours. Cardiac Enzymes No results for input(s): "CKTOTAL", "CKMB", "CKMBINDEX", "TROPONINI" in the last 72 hours.  BNP: BNP (last 3 results) Recent Labs    02/09/23 1736 08/01/23 1140 09/22/23 0215  BNP 345.1* 523.3* 572.3*    ProBNP (last 3 results) No results for input(s): "PROBNP" in the last 8760 hours.   D-Dimer No results for input(s): "DDIMER" in the last 72 hours. Hemoglobin A1C No results for input(s): "HGBA1C" in the last 72 hours. Fasting Lipid Panel No results for input(s): "CHOL", "HDL", "LDLCALC", "TRIG", "CHOLHDL", "LDLDIRECT" in the last 72 hours. Thyroid Function Tests No results for input(s): "TSH", "T4TOTAL", "T3FREE", "THYROIDAB" in the last 72 hours.  Invalid input(s): "FREET3"  Other results:   Imaging    No results found.   Medications:     Scheduled Medications:  acetaZOLAMIDE  250 mg Oral BID   apixaban  5 mg Oral BID   atorvastatin  40 mg Oral QPM   clotrimazole  1 Application Topical BID   ezetimibe  10 mg Oral Daily   insulin aspart  0-15 Units Subcutaneous TID WC   insulin aspart  0-5 Units Subcutaneous QHS   insulin glargine  10 Units Subcutaneous QHS   levothyroxine  75 mcg Oral QAC breakfast   metolazone  5 mg Oral BID   mexiletine  250 mg Oral Q12H   potassium chloride  40 mEq Oral Q4H   sodium chloride flush  10-40 mL Intracatheter Q12H    Infusions:  furosemide (LASIX) 200 mg in dextrose 5 % 100 mL (2 mg/mL) infusion 30 mg/hr (10/01/23 1027)   milrinone 0.25 mcg/kg/min (10/01/23 0520)   promethazine (PHENERGAN) injection (IM or  IVPB) 6.25 mg (10/01/23 1035)    PRN Medications: acetaminophen **OR** acetaminophen, Gerhardt's butt cream, hydrOXYzine, mineral oil-hydrophilic petrolatum, mouth rinse, oxyCODONE, prochlorperazine, promethazine (PHENERGAN) injection (IM or IVPB), sodium chloride flush    Patient Profile   [name] with CKD stage III, hypothyroidism, mitral valve endocarditis in 2022, history of CVA, right BKA, severe morbid obesity (BMI 70) and chronic biventricular failure currently admitted with heart failure exacerbation.   Assessment/Plan   1. A/C Biventricular HF EF continues to drop. Previous Echo EF 35-40% EF but now 25-30% + RV failure. Etiology unclear. ? PVC induced? She has not had ischemic work up due to weight/CKD.  Readmitted with marked volume overload. Poor response with intermittent IV lasix and IV bumex. Milrinone added to augment diuresis.  - remains on milrinone 0.25, Co-ox 73% - on lasix gtt at 30/hr. Diuresing but remains edematous w/ 3rd spacing.  I am going to switch diuretics to the regimen from most recent admit given the response. Start lasix drip 30 mg per hour, metolazone 5 mg twice a day, and diamox 250 mg twice a day.  - continue lasix gtt 30 + metolazone 5 mg bid + Diamox 500 mg  - GDMT limited by hypotension and AKI    2. CKD  Stage IV on AKI  - SCr 3.2 on admit, b./ ~2.0  - Scr  2.7 today, Co-ox ok w/ milrinone - continue milrinone + diuresis per above - follow BMP    3. Hypothyroidism  - Recent TSH stable.    4. PVCs  Continue mexiletine 250 every 12 hours.  - supp K (3.2)   5. H/O PE/DVT - Continue eliquis 5 mg bid.    6.Suspected OHS/OSA - Refuses sleep study   7. Severe Morbid Obesity.  DMII. HGb A1C 7.2 Consider GLP1 as an outpatient.  Body mass index is 69.65 kg/m.   8. Right BKA   9. T2DM - Hgb of 7.2 (improved from 12.2 in 2023) - schedule SSI - per IM    Length of Stay: [address] PA-C  47/04/2024, 10:49 AM  Advanced  Heart Failure Team Pager [phone] (M-F; 7a - 5p)  Please contact [facility] for night-coverage after hours (5p -7a ) and weekends on [email or web address]  Patient seen with PA, I formulated the plan and agree with the above note.    Co-ox 73% on milrinone 0.25.  CVP 18 on my read.  I/Os net negative 2525 yesterday. Creatinine stable at 2.72.   No dyspnea at rest.   General: NAD Neck: JVP 16+, no thyromegaly or thyroid nodule.  Lungs: Decreased at bases.  CV: Nondisplaced PMI.  Heart regular S1/S2, no S3/S4, no murmur. 1+  edema to knees.  Abdomen: Soft, nontender, no hepatosplenomegaly, no distention.  Skin: Intact without lesions or rashes.  Neurologic: Alert and oriented x 3.  Psych: Normal affect. Extremities: No clubbing or cyanosis. S/p right BKA.  HEENT: Normal.   Patient is still volume overloaded with CVP 18 on my read.  Creatinine stable with diuresis (2.74 => 2.72), good co-ox. GDMT limited by elevated creatinine, AKI on CKD stage IV.  - Continue milrinone 0.25 while diuresing.  - Lasix 30 mg/hr + metolazone 5 bid + acetazolamide 250 mg bid.  - Replace K and repeat BMET in pm.   EF lower on echo this admission at 25-30%.  No chest pain.  No cath for now with AKI.   Continue apixaban for h/o PE/DVT.   Continue mexiletine for PVCs.   Marca Ancona 10/01/2023 12:42 PM

## 2023-10-01 NOTE — Progress Notes (Signed)
 Progress Note   Patient: Karen Bennett:[id number] DOB: 1976/06/29 DOA: 09/22/2023     9 DOS: the patient was seen and examined on 10/01/2023   Brief hospital course: Mrs. Windsor was admitted to the hospital with the working diagnosis of heart failure decompensation.   48 year old female with history of obesity class 3, chronic systolic CHF, HTN, HLD, IDDM, hypothyroidism, prior DVT/PE, history of Candida mitral valve endocarditis in 2022, pulmonary hypertension, CKD 3B, right BKA, prior CVA, who comes into the hospital with worsening abdominal distention, lower extremity swelling and shortness of breath.   She was recently hospitalized and discharged in January 2025 for acute on chronic systolic CHF, required significant diuresis with milrinone.  Since discharge she has been doing well, compliant with her home medications as well as a low-sodium diet, but over the last several days-a week she has noticed increased swelling, decreased urination, and worsening shortness of breath..  In the ED she was found to have volume overload, acute kidney injury, and hyperkalemia.   Patient was placed on IV furosemide, and inotropic support with milrinone.  03/07 transferred to [facility] from [facility] for advance heart failure management.   Started on furosemide drip for further diuresis.     Assessment and Plan: * Acute on chronic systolic heart failure (HCC) Echocardiogram with reduced LV systolic function EF 25 to 30%, severe LV cavity dilatation, interventricular septum flattened in systole, RV systolic function with moderate reduction, RVSP 78.2 RA with mild dilatation,  LV entire apex akinetic.  No significant valvular disease.   Acute on chronic core pulmonale Pulmonary hypertension  RV failure.   Urine output 2,875 ml Systolic blood pressure 100 mmHg range.   Inotropic support with milrinone 0,25 mcg/kg/min  Furosemide drip 30 ml per hr.  Acetazolamide and metolazone 5 mg tid.   No SGLT 2  inh due to risk of urine and perineal skin infections.  Patient has a foley cathter in place.  Holding RAAS due to acute worsening in GFR.   Frequent PVC, continue with mexiletine.   Acute kidney injury superimposed on stage 3a chronic kidney disease (HCC) Hyponatremia, hypokalemia.   Continue volume overloaded, renal function today with serum cr at 2,72 with K at 3,2 and serum bicarbonate at 25  Na 129   Plan for K correction with Kcl 40 meq x 3 doses, follow up renal function this evening.  Keep K 4 and Mg at 2.  Continue diuresis with furosemide, acetazolamide and metolazone.   History of pulmonary embolism Continue anticoagulation with apixaban.   Type 2 diabetes mellitus with hyperlipidemia (HCC) Continue glucose cover and monitoring with insulin sliding scale.  Basal insulin with 10 units glargine.  Fasting glucose today 164 mg   Continue with ezetimibe and atorvastatin.   Hx of right BKA (HCC) PT and OT as tolerated.   Hypothyroidism Continue with levothyroxine   BMI 60.0-69.9, adult (HCC) Calculated BMI is 70.1   History of CVA (cerebrovascular accident) Continue blood pressure control.   Anxiety and depression Continue with as needed hydroxyzine     Subjective: Patient with some improvement, but continue to have edema and dyspnea, her mobility is limited due to BKA and high BMI   Physical Exam: Vitals:   09/30/23 2303 10/01/23 0510 10/01/23 0752 10/01/23 1136  BP: 124/75 127/60 116/60 110/60  Pulse: 98 99 (!) 103 (!) 102  Resp: 19 20 20 20   Temp: 97.9 F (36.6 C) 97.7 F (36.5 C) 98.1 F (36.7 C) 97.9 F (36.6  C)  TempSrc: Oral Oral Oral Oral  SpO2: 96% 94% 96% 94%  Weight:  (!) 198.9 kg    Height:       Neurology awake and alert ENT with mild pallor Cardiovascular with S1 and S2 present and regular with no gallops, rubs or murmurs Wide neck  Positive lower extremity edema +++ pitting left leg and bilateral thighs. Abdomen protuberant with no  distention  Right BKA  Data Reviewed:    Family Communication: no family at the bedside   Disposition: Status is: Inpatient Remains inpatient appropriate because: IV diuresis and IV inotropic support   Planned Discharge Destination: Home    Author: Coralie Keens, MD 10/01/2023 11:49 AM  For on call review [email or web address].

## 2023-10-02 DIAGNOSIS — I5023 Acute on chronic systolic (congestive) heart failure: Secondary | ICD-10-CM | POA: Diagnosis not present

## 2023-10-02 DIAGNOSIS — N179 Acute kidney failure, unspecified: Secondary | ICD-10-CM | POA: Diagnosis not present

## 2023-10-02 DIAGNOSIS — E1169 Type 2 diabetes mellitus with other specified complication: Secondary | ICD-10-CM | POA: Diagnosis not present

## 2023-10-02 DIAGNOSIS — Z86711 Personal history of pulmonary embolism: Secondary | ICD-10-CM | POA: Diagnosis not present

## 2023-10-02 LAB — BASIC METABOLIC PANEL
Anion gap: 12 (ref 5–15)
BUN: 73 mg/dL — ABNORMAL HIGH (ref 6–20)
CO2: 26 mmol/L (ref 22–32)
Calcium: 8.6 mg/dL — ABNORMAL LOW (ref 8.9–10.3)
Chloride: 93 mmol/L — ABNORMAL LOW (ref 98–111)
Creatinine, Ser: 2.87 mg/dL — ABNORMAL HIGH (ref 0.44–1.00)
GFR, Estimated: 20 mL/min — ABNORMAL LOW (ref >60)
Glucose, Bld: 121 mg/dL — ABNORMAL HIGH (ref 70–99)
Potassium: 3.3 mmol/L — ABNORMAL LOW (ref 3.5–5.1)
Sodium: 131 mmol/L — ABNORMAL LOW (ref 135–145)

## 2023-10-02 LAB — COOXEMETRY PANEL
Carboxyhemoglobin: 1.8 % — ABNORMAL HIGH (ref 0.5–1.5)
Methemoglobin: 0.7 % (ref 0.0–1.5)
O2 Saturation: 79.9 %
Total hemoglobin: 10.1 g/dL — ABNORMAL LOW (ref 12.0–16.0)

## 2023-10-02 LAB — GLUCOSE, CAPILLARY
Glucose-Capillary: 122 mg/dL — ABNORMAL HIGH (ref 70–99)
Glucose-Capillary: 120 mg/dL — ABNORMAL HIGH (ref 70–99)
Glucose-Capillary: 119 mg/dL — ABNORMAL HIGH (ref 70–99)
Glucose-Capillary: 117 mg/dL — ABNORMAL HIGH (ref 70–99)

## 2023-10-02 MED ORDER — DIPHENHYDRAMINE-ZINC ACETATE 2-0.1 % EX CREA
TOPICAL_CREAM | Freq: Three times a day (TID) | CUTANEOUS | Status: DC | PRN
  Administered 2023-10-13: 1 via TOPICAL
  Filled 2023-10-02: qty 28

## 2023-10-02 MED ORDER — POTASSIUM CHLORIDE 20 MEQ PO PACK
40.0000 meq | PACK | ORAL | Status: AC
  Administered 2023-10-02 (×3): 40 meq via ORAL
  Filled 2023-10-02 (×3): qty 2

## 2023-10-02 NOTE — TOC Initial Note (Signed)
 Transition of Care [facility]) - Initial/Assessment Note    Patient Details  Name: Karen Bennett MRN: [id number] Date of Birth: 1975/10/28  Transition of Care [facility]) CM/SW Contact:    Nicanor Bake Phone Number: [phone] 10/02/2023, 4:09 PM  Clinical Narrative:      HF CSW met wit pt at bedside. Pt stated that she lives alone. Pt stated that she is currently active with [facility]. Pt stated that she does not use any equipment, but inquired if we could assist with ordering a hoyer lift. CSW notified NCM via secure chat. Pt stated that she does not have a scale at home. Pt stated that she has a PCP. CSW explained that typically hospital follow up appointments are scheduled closer to dc. Pt agrees. Pt stated that she will need ambulance transportation home.  TOC will continue following.           Expected Discharge Plan: Home w Home Health Services Barriers to Discharge: Continued Medical Work up   Patient Goals and CMS Choice            Expected Discharge Plan and Services       Living arrangements for the past 2 months: Apartment                                      Prior Living Arrangements/Services Living arrangements for the past 2 months: Apartment Lives with:: Self Patient language and need for interpreter reviewed:: Yes Do you feel safe going back to the place where you live?: Yes      Need for Family Participation in Patient Care: Yes (Comment) Care giver support system in place?: Yes (comment)   Criminal Activity/Legal Involvement Pertinent to Current Situation/Hospitalization: No - Comment as needed  Activities of Daily Living   ADL Screening (condition at time of admission) Independently performs ADLs?: Yes (appropriate for developmental age) Is the patient deaf or have difficulty hearing?: No Does the patient have difficulty seeing, even when wearing glasses/contacts?: No Does the patient have difficulty concentrating,  remembering, or making decisions?: No  Permission Sought/Granted                  Emotional Assessment Appearance:: Appears stated age Attitude/Demeanor/Rapport: Engaged Affect (typically observed): Accepting Orientation: : Oriented to Self, Oriented to Place, Oriented to  Time, Oriented to Situation Alcohol / Substance Use: Not Applicable Psych Involvement: No (comment)  Admission diagnosis:  Hyperkalemia [E87.5] Anasarca [R60.1] Hyponatremia [E87.1] Peripheral edema [R60.0] AKI (acute kidney injury) (HCC) [N17.9] Acute on chronic HFrEF (heart failure with reduced ejection fraction) (HCC) [I50.23] Acute congestive heart failure, unspecified heart failure type [facility]) [I50.9] Patient Active Problem List   Diagnosis Date Noted   Acute on chronic HFrEF (heart failure with reduced ejection fraction) (HCC) 09/22/2023   History of CVA (cerebrovascular accident) 08/15/2023   Acute diastolic (congestive) heart failure (HCC) 08/01/2023   Acute CVA (cerebrovascular accident) (HCC) 03/23/2023   Aphasia 03/21/2023   QT prolongation 03/21/2023   Paraplegia (HCC) 02/09/2023   Acute pulmonary embolism (HCC) 01/25/2023   Acute on chronic systolic heart failure (HCC) 01/11/2023   Chronic kidney disease, stage 3a (HCC) 01/11/2023   Acute pain of right lower extremity 07/04/2022   History of femur fracture 02/23/2022   Bacteremia 02/22/2022   Hypothyroidism    Hyperglycemia 10/24/2021   Hx of bacterial endocarditis 10/24/2021   Pyuria 10/24/2021  Candida rash of groin 09/11/2021   Obstruction of right ureteropelvic junction (UPJ) due to stone 09/04/2021   Stenosis of subclavian artery (HCC) 02/09/2021   Sepsis (HCC) 02/01/2021   Mixed hyperlipidemia 01/12/2021   Infective endocarditis    BMI 60.0-69.9, adult (HCC) 12/27/2020   Hypertension associated with diabetes (HCC) 12/27/2020   History of pulmonary embolism 12/27/2020   Necrotizing fasciitis of lower leg (HCC) Thigh  12/27/2020    Cellulitis 12/26/2020   History of DVT (deep vein thrombosis) 11/12/2020   Tobacco abuse 12/31/2017   Hx of right BKA (HCC) 12/31/2017   Closed fracture of right distal femur (HCC) 12/31/2017   Hirsutism 06/21/2016   Noncompliance with treatment plan 04/19/2015   Anxiety and depression 06/26/2013   Normocytic anemia 05/22/2013   Depression 05/22/2013   Grief reaction 05/22/2013   Acute kidney injury superimposed on stage 4 chronic kidney disease (HCC) 05/07/2013   Phantom limb pain (HCC) 05/06/2013   Type 2 diabetes mellitus with hyperlipidemia (HCC) 05/02/2013   Hypokalemia 05/01/2013   Diabetic peripheral neuropathy associated with type 2 diabetes mellitus (HCC) 04/30/2013    Class: Chronic   MSSA (methicillin susceptible Staphylococcus aureus) 09/06/2012   Foot osteomyelitis, left (HCC) 09/03/2012   Pulmonary emboli (HCC) 11/11/2011   Hyponatremia 09/05/2011   Diabetic foot ulcer (HCC) 09/05/2011   Insulin dependent type 2 diabetes mellitus (HCC) 06/12/2007   PCP:  [name], NP Pharmacy:   [id number] - [place], [name] - [address] AT [facility] [address] [place] [address] Phone: [phone] Fax: [phone]  [facility] [address] [place] [address] Phone: [phone] Fax: [phone]     Social Drivers of Health (SDOH) Social History: SDOH Screenings   Food Insecurity: No Food Insecurity (09/22/2023)  Housing: Low Risk  (09/22/2023)  Transportation Needs: No Transportation Needs (09/22/2023)  Utilities: Not At Risk (09/22/2023)  Financial Resource Strain: Low Risk  (09/07/2023)   Received from [facility]  Physical Activity: Sufficiently Active (05/07/2023)   Received from [facility]  Social Connections: Moderately Integrated (05/07/2023)   Received from [facility]  Stress: Stress Concern Present (05/07/2023)   Received from [facility]  Tobacco Use:  High Risk (09/22/2023)   SDOH Interventions:     Readmission Risk Interventions    09/24/2023   11:10 AM 08/02/2023    1:28 PM 02/13/2023   11:23 AM  Readmission Risk Prevention Plan  Transportation Screening Complete Complete Complete  PCP or Specialist Appt within 3-5 Days   Complete  HRI or Home Care Consult   Complete  Social Work Consult for Recovery Care Planning/Counseling   Complete  Palliative Care Screening   Complete  Medication Review Oceanographer) Complete Complete Complete  PCP or Specialist appointment within 3-5 days of discharge  Complete   HRI or Home Care Consult Complete Complete   SW Recovery Care/Counseling Consult Complete Complete   Palliative Care Screening Not Applicable Not Applicable   Skilled Nursing Facility Not Applicable Not Applicable

## 2023-10-02 NOTE — TOC Progression Note (Signed)
 Transition of Care [facility]) - Progression Note    Patient Details  Name: Karen Bennett MRN: [id number] Date of Birth: [birth year]  Transition [facility] Care [facility]) CM/SW Contact  Kermit Balo, RN Phone Number: 10/02/2023, 10:19 AM  Clinical Narrative:     Pt still on IV milrinone and lasix gtt.  TOC following.    Barriers to Discharge: Continued Medical Work up  Expected Discharge Plan and Services                                               Social Determinants of Health (SDOH) Interventions SDOH Screenings   Food Insecurity: No Food Insecurity (09/22/2023)  Housing: Low Risk  (09/22/2023)  Transportation Needs: No Transportation Needs (09/22/2023)  Utilities: Not At Risk (09/22/2023)  Financial Resource Strain: Low Risk  (09/07/2023)   Received from [facility]  Physical Activity: Sufficiently Active (05/07/2023)   Received from [facility]  Social Connections: Moderately Integrated (05/07/2023)   Received from [facility]  Stress: Stress Concern Present (05/07/2023)   Received from [facility]  Tobacco Use: High Risk (09/22/2023)    Readmission Risk Interventions    09/24/2023   11:10 AM 08/02/2023    1:28 PM 02/13/2023   11:23 AM  Readmission Risk Prevention Plan  Transportation Screening Complete Complete Complete  PCP or Specialist Appt within 3-5 Days   Complete  HRI or Home Care Consult   Complete  Social Work Consult for Recovery Care Planning/Counseling   Complete  Palliative Care Screening   Complete  Medication Review Oceanographer) Complete Complete Complete  PCP or Specialist appointment within 3-5 days of discharge  Complete   HRI or Home Care Consult Complete Complete   SW Recovery Care/Counseling Consult Complete Complete   Palliative Care Screening Not Applicable Not Applicable   Skilled Nursing Facility Not Applicable Not Applicable

## 2023-10-02 NOTE — Progress Notes (Addendum)
 Advanced Heart Failure Rounding Note  Cardiologist: Chilton Si, MD  Chief Complaint: acute on chronic systolic heart failure Subjective:    On milrinone 0.25 + Lasix gtt at 30/hr, also received metolazone and diamox w/ 2.4L in UOP yesterday.  SCr 2.74>>2.87. K 3.3. Urine clear in foley. CVP 13.    Co-ox 80%.   Resting comfortably. Denies resting dyspnea.   Objective:   Weight Range: (!) 200.3 kg Body mass index is 71.27 kg/m.   Vital Signs:   Temp:  [97.7 F (36.5 C)-98.8 F (37.1 C)] 98.1 F (36.7 C) (03/11 0737) Pulse Rate:  [94-109] 109 (03/11 0737) Resp:  [20] 20 (03/11 0737) BP: (104-121)/(57-68) 115/65 (03/11 0737) SpO2:  [93 %-96 %] 94 % (03/11 0737) Weight:  [200.3 kg] 200.3 kg (03/11 0519) Last BM Date : 09/30/23  Weight change: Filed Weights   09/30/23 0310 10/01/23 0510 10/02/23 0519  Weight: (!) 197.2 kg (!) 198.9 kg (!) 200.3 kg    Intake/Output:   Intake/Output Summary (Last 24 hours) at 10/02/2023 0858 Last data filed at 10/02/2023 0852 Gross per 24 hour  Intake 716 ml  Output 2350 ml  Net -1634 ml      Physical Exam    CVP 13  General:  Well appearing. No respiratory difficulty HEENT: normal Neck: supple.Thick neck, JVD not well visualized. Carotids 2+ bilat; no bruits. No lymphadenopathy or thyromegaly appreciated. Cor: PMI nondisplaced. Irregularly irregular rhythm and rate. No rubs, gallops or murmurs. Lungs: clear Abdomen: soft, nontender, nondistended. No hepatosplenomegaly. No bruits or masses. Good bowel sounds. Extremities: no cyanosis, clubbing, rash,  rt BKA, obese/ edematous LEE  Neuro: alert & oriented x 3, cranial nerves grossly intact. moves all 4 extremities w/o difficulty. Affect pleasant. GU: + foley   Telemetry   Sinus tach low 100s, personally reviewed   EKG    No new EKG to review  Labs    CBC Recent Labs    09/30/23 0455  WBC 3.8*  HGB 9.5*  HCT 29.9*  MCV 86.2  PLT 164   Basic Metabolic  Panel Recent Labs    09/30/23 0455 09/30/23 1702 10/01/23 1745 10/02/23 0536  NA 128*   < > 130* 131*  K 3.2*   < > 3.3* 3.3*  CL 91*   < > 93* 93*  CO2 23   < > 27 26  GLUCOSE 164*   < > 115* 121*  BUN 73*   < > 73* 73*  CREATININE 2.78*   < > 2.74* 2.87*  CALCIUM 8.0*   < > 8.1* 8.6*  MG 2.0  --   --   --    < > = values in this interval not displayed.   Liver Function Tests No results for input(s): "AST", "ALT", "ALKPHOS", "BILITOT", "PROT", "ALBUMIN" in the last 72 hours. No results for input(s): "LIPASE", "AMYLASE" in the last 72 hours. Cardiac Enzymes No results for input(s): "CKTOTAL", "CKMB", "CKMBINDEX", "TROPONINI" in the last 72 hours.  BNP: BNP (last 3 results) Recent Labs    02/09/23 1736 08/01/23 1140 09/22/23 0215  BNP 345.1* 523.3* 572.3*    ProBNP (last 3 results) No results for input(s): "PROBNP" in the last 8760 hours.   D-Dimer No results for input(s): "DDIMER" in the last 72 hours. Hemoglobin A1C No results for input(s): "HGBA1C" in the last 72 hours. Fasting Lipid Panel No results for input(s): "CHOL", "HDL", "LDLCALC", "TRIG", "CHOLHDL", "LDLDIRECT" in the last 72 hours. Thyroid Function Tests No results  for input(s): "TSH", "T4TOTAL", "T3FREE", "THYROIDAB" in the last 72 hours.  Invalid input(s): "FREET3"  Other results:   Imaging    No results found.   Medications:     Scheduled Medications:  acetaZOLAMIDE  250 mg Oral BID   apixaban  5 mg Oral BID   atorvastatin  40 mg Oral QPM   clotrimazole  1 Application Topical BID   ezetimibe  10 mg Oral Daily   insulin aspart  0-15 Units Subcutaneous TID WC   insulin aspart  0-5 Units Subcutaneous QHS   insulin glargine  10 Units Subcutaneous QHS   levothyroxine  75 mcg Oral QAC breakfast   metolazone  5 mg Oral BID   mexiletine  250 mg Oral Q12H   potassium chloride  40 mEq Oral Q4H   sodium chloride flush  10-40 mL Intracatheter Q12H    Infusions:  furosemide (LASIX) 200  mg in dextrose 5 % 100 mL (2 mg/mL) infusion 30 mg/hr (10/02/23 1610)   milrinone 0.25 mcg/kg/min (10/02/23 9604)   promethazine (PHENERGAN) injection (IM or IVPB) 6.25 mg (10/01/23 1035)    PRN Medications: acetaminophen **OR** acetaminophen, Gerhardt's butt cream, hydrOXYzine, mineral oil-hydrophilic petrolatum, mouth rinse, oxyCODONE, prochlorperazine, promethazine (PHENERGAN) injection (IM or IVPB), sodium chloride flush    Patient Profile   [name] with CKD stage III, hypothyroidism, mitral valve endocarditis in 2022, history of CVA, right BKA, severe morbid obesity (BMI 70) and chronic biventricular failure currently admitted with heart failure exacerbation.   Assessment/Plan   1. A/C Biventricular HF EF continues to drop. Previous Echo EF 35-40% EF but now 25-30% + RV failure. Etiology unclear. ? PVC induced? She has not had ischemic work up due to weight/CKD.  Readmitted with marked volume overload. Poor response with intermittent IV lasix and IV bumex. Milrinone added to augment diuresis.  - remains on milrinone 0.25, Co-ox 80% - on lasix gtt at 30/hr. Diuresing but remains edematous. CVP 13   - continue lasix gtt 30 + metolazone 5 mg bid + Diamox 500 mg and supp K  - GDMT limited by hypotension and AKI    2. CKD  Stage IV on AKI  - SCr 3.2 on admit, b/l ~2.0  - Scr  2.9 today, Co-ox ok w/ milrinone - continue milrinone + diuresis per above - follow BMP    3. Hypothyroidism  - Recent TSH stable.    4. PVCs  Continue mexiletine 250 every 12 hours.  - supp K (3.3)   5. H/O PE/DVT - Continue eliquis 5 mg bid.    6.Suspected OHS/OSA - Refuses sleep study   7. Severe Morbid Obesity.  DMII. HGb A1C 7.2 Consider GLP1 as an outpatient.  Body mass index is 69.65 kg/m.   8. Right BKA   9. T2DM - Hgb of 7.2 (improved from 12.2 in 2023) - schedule SSI - per IM    Length of Stay: [name] PA-C  10/02/2023, 8:58 AM  Advanced Heart Failure  Team Pager [phone] (M-F; 7a - 5p)  Please contact [facility] for night-coverage after hours (5p -7a ) and weekends on [email or web address]  Patient seen with PA, I formulated the plan and agree with the above note.   I/Os net negative 1732.  Co-ox 80%.  Creatinine 2.74 => 2.87 with stable BUN.  CVP remains high at 18.   General: NAD, obese Neck: JVP 16+, no thyromegaly or thyroid nodule.  Lungs: Decreased at bases.  CV: Nondisplaced PMI.  Heart  regular S1/S2, no S3/S4, no murmur.  2+ edema to knee on left    Abdomen: Soft, nontender, no hepatosplenomegaly, no distention.  Skin: Intact without lesions or rashes.  Neurologic: Alert and oriented x 3.  Psych: Normal affect. Extremities: Right BKA HEENT: Normal.   Patient is still volume overloaded with CVP 18 on my read.  Creatinine mildly higher with diuresis (2.74 => 2.72 => 2.87) but BUN stable, good co-ox. GDMT limited by elevated creatinine, AKI on CKD stage IV.  I worry that renal function is going to be a limiting factor to our ability to diurese her.  UOP is improving.  - Continue milrinone 0.25 while diuresing.  - Lasix 30 mg/hr + metolazone 5 bid + acetazolamide 250 mg bid again today.  - Replace K    EF lower on echo this admission at 25-30%.  No chest pain.  No cath for now with AKI.    Continue apixaban for h/o PE/DVT.    Continue mexiletine for PVCs.   Marca Ancona 10/02/2023 10:26 AM

## 2023-10-02 NOTE — Progress Notes (Signed)
 PROGRESS NOTE    Karen Bennett  ZOX:[id number] DOB: 10-16-1975 DOA: 09/22/2023 PCP: [name], NP  47/F w obesity class 3, chronic systolic CHF, HTN, HLD, IDDM, hypothyroidism, prior DVT/PE, history of Candida mitral valve endocarditis in 2022, pulmonary hypertension, CKD 3B, right BKA, prior CVA, presented to ED with worsening abdominal distention, lower extremity swelling and shortness of breath.  -recently hospitalized and discharged in January 2025 for acute on chronic systolic CHF, required significant diuresis with milrinone.  -In the ED she was found to have volume overload, acute kidney injury, and hyperkalemia.  -placed on IV furosemide, and inotropic support with milrinone.  -3/07 transferred to [facility] from [facility] for advance heart failure management.   Started on furosemide drip for further diuresis.    Subjective:   Assessment and Plan:  Acute on chronic systolic heart failure (HCC) BiV Failure Pulm HTN -Echo w/ EF 25 to 30%, severe LV cavity dilatation, interventricular septum flattened in systole, RV systolic function with moderate reduction, RVSP 78.2 RA with mild dilatation,  -CHF team following, continue milrinone 0,25 mcg/kg/min  Furosemide drip 30 ml per hr.  Acetazolamide 250 mg bid and metolazone 5 mg bid.  -poor SGLT2I candidate-has foley   Frequent PVC,  -continue mexiletine, monitor lytes  AKI on CKD4 Hyponatremia, hypokalemia. (Base serum cr 2,0 -creat was 3.2 on admission, now 2.9 on milrinone   History of pulmonary embolism Continue apixaban.   Type 2 diabetes mellitus  Continue glucose cover and monitoring with insulin sliding scale.  Basal insulin with 10 units glargine.   Hx of right BKA (HCC) PT and OT as tolerated.   Hypothyroidism Continue with levothyroxine   Morbid Obesity OSA/OHS -declined sleep study, needs significant wt loss  History of CVA (cerebrovascular accident) Continue blood pressure control and statin therapy.    Anxiety and depression Continue with as needed hydroxyzine    DVT prophylaxis: apixaban Code Status: Full Code Family Communication: Disposition Plan:   Consultants:    Procedures:   Antimicrobials:    Objective: Vitals:   10/02/23 0515 10/02/23 0519 10/02/23 0737 10/02/23 1133  BP: (!) 112/57  115/65 (!) 111/55  Pulse: 100  (!) 109 (!) 105  Resp:   20 19  Temp: 98 F (36.7 C)  98.1 F (36.7 C) 98.4 F (36.9 C)  TempSrc: Oral  Oral Oral  SpO2: 96%  94% 95%  Weight:  (!) 200.3 kg    Height:        Intake/Output Summary (Last 24 hours) at 10/02/2023 1614 Last data filed at 10/02/2023 1449 Gross per 24 hour  Intake 598 ml  Output 2450 ml  Net -1852 ml   Filed Weights   09/30/23 0310 10/01/23 0510 10/02/23 0519  Weight: (!) 197.2 kg (!) 198.9 kg (!) 200.3 kg    Examination:      Data Reviewed:   CBC: Recent Labs  Lab 09/26/23 0505 09/30/23 0455  WBC 3.8* 3.8*  HGB 10.0* 9.5*  HCT 33.2* 29.9*  MCV 90.0 86.2  PLT 188 164   Basic Metabolic Panel: Recent Labs  Lab 09/26/23 0505 09/27/23 0710 09/28/23 0340 09/29/23 0535 09/30/23 0455 09/30/23 1702 10/01/23 0416 10/01/23 1745 10/02/23 0536  NA 129* 130* 131* 125* 128* 131* 129* 130* 131*  K 4.0 3.8 3.5 3.2* 3.2* 3.0* 3.2* 3.3* 3.3*  CL 94* 94* 94* 92* 91* 93* 93* 93* 93*  CO2 25 24 27 23 23 25 25 27 26   GLUCOSE 80 85 100* 218* 164* 108* 164*  115* 121*  BUN 76* 78* 75* 73* 73* 71* 68* 73* 73*  CREATININE 3.16* 3.06* 3.01* 2.90* 2.78* 2.74* 2.72* 2.74* 2.87*  CALCIUM 8.2* 8.3* 8.2* 7.7* 8.0* 8.2* 7.9* 8.1* 8.6*  MG 2.2 2.0 2.1 2.0 2.0  --   --   --   --   PHOS 7.1*  --   --   --   --   --   --   --   --    GFR: Estimated Creatinine Clearance: 44.7 mL/min (A) (by C-G formula based on SCr of 2.87 mg/dL (H)). Liver Function Tests: Recent Labs  Lab 09/26/23 0505  AST 9*  ALT 8  ALKPHOS 65  BILITOT 1.1  PROT 6.8  ALBUMIN 2.7*   No results for input(s): "LIPASE", "AMYLASE" in the  last 168 hours. No results for input(s): "AMMONIA" in the last 168 hours. Coagulation Profile: No results for input(s): "INR", "PROTIME" in the last 168 hours. Cardiac Enzymes: No results for input(s): "CKTOTAL", "CKMB", "CKMBINDEX", "TROPONINI" in the last 168 hours. BNP (last 3 results) No results for input(s): "PROBNP" in the last 8760 hours. HbA1C: No results for input(s): "HGBA1C" in the last 72 hours. CBG: Recent Labs  Lab 10/01/23 1557 10/01/23 2115 10/02/23 0610 10/02/23 1132 10/02/23 1611  GLUCAP 116* 124* 122* 120* 119*   Lipid Profile: No results for input(s): "CHOL", "HDL", "LDLCALC", "TRIG", "CHOLHDL", "LDLDIRECT" in the last 72 hours. Thyroid Function Tests: No results for input(s): "TSH", "T4TOTAL", "FREET4", "T3FREE", "THYROIDAB" in the last 72 hours. Anemia Panel: No results for input(s): "VITAMINB12", "FOLATE", "FERRITIN", "TIBC", "IRON", "RETICCTPCT" in the last 72 hours. Urine analysis:    Component Value Date/Time   COLORURINE AMBER (A) 03/22/2023 0426   APPEARANCEUR TURBID (A) 03/22/2023 0426   LABSPEC 1.040 (H) 03/22/2023 0426   PHURINE 5.0 03/22/2023 0426   GLUCOSEU >=500 (A) 03/22/2023 0426   HGBUR LARGE (A) 03/22/2023 0426   BILIRUBINUR NEGATIVE 03/22/2023 0426   KETONESUR NEGATIVE 03/22/2023 0426   PROTEINUR >=300 (A) 03/22/2023 0426   UROBILINOGEN 0.2 09/05/2011 0558   NITRITE POSITIVE (A) 03/22/2023 0426   LEUKOCYTESUR MODERATE (A) 03/22/2023 0426   Sepsis Labs: @LABRCNTIP (procalcitonin:4,lacticidven:4)  ) Recent Results (from the past 240 hours)  MRSA Next Gen by PCR, Nasal     Status: Abnormal   Collection Time: 09/22/23 10:06 PM   Specimen: Nasal Mucosa; Nasal Swab  Result Value Ref Range Status   MRSA by PCR Next Gen DETECTED (A) NOT DETECTED Final    Comment: RESULT CALLED TO, READ BACK BY AND VERIFIED WITH: SIMON,K AT 0429 ON 09/23/23 BY LUZOLOP (NOTE) The GeneXpert MRSA Assay (FDA approved for NASAL specimens only), is one  component of a comprehensive MRSA colonization surveillance program. It is not intended to diagnose MRSA infection nor to guide or monitor treatment for MRSA infections. Test performance is not FDA approved in patients less than 72 years old. Performed at [facility], [address] [place], [place] [address]   MRSA Next Gen by PCR, Nasal     Status: None   Collection Time: 09/24/23  4:49 PM   Specimen: Nasal Mucosa; Nasal Swab  Result Value Ref Range Status   MRSA by PCR Next Gen NOT DETECTED NOT DETECTED Final    Comment: (NOTE) The GeneXpert MRSA Assay (FDA approved for NASAL specimens only), is one component of a comprehensive MRSA colonization surveillance program. It is not intended to diagnose MRSA infection nor to guide or monitor treatment for MRSA infections.  Test performance is not FDA approved in patients less than 20 years old. Performed at [facility], [address] [place], [place] [address]   MRSA Next Gen by PCR, Nasal     Status: None   Collection Time: 09/27/23 10:54 PM   Specimen: Nasal Mucosa; Nasal Swab  Result Value Ref Range Status   MRSA by PCR Next Gen NOT DETECTED NOT DETECTED Final    Comment: (NOTE) The GeneXpert MRSA Assay (FDA approved for NASAL specimens only), is one component of a comprehensive MRSA colonization surveillance program. It is not intended to diagnose MRSA infection nor to guide or monitor treatment for MRSA infections. Test performance is not FDA approved in patients less than 48 years old. Performed at [facility], [address] [place], [place] [address]      Radiology Studies: No results found.   Scheduled Meds:  acetaZOLAMIDE  250 mg Oral BID   apixaban  5 mg Oral BID   atorvastatin  40 mg Oral QPM   clotrimazole  1 Application Topical BID   ezetimibe  10 mg Oral Daily   insulin aspart  0-15 Units Subcutaneous TID WC   insulin aspart  0-5 Units Subcutaneous QHS    insulin glargine  10 Units Subcutaneous QHS   levothyroxine  75 mcg Oral QAC breakfast   metolazone  5 mg Oral BID   mexiletine  250 mg Oral Q12H   potassium chloride  40 mEq Oral Q4H   sodium chloride flush  10-40 mL Intracatheter Q12H   Continuous Infusions:  furosemide (LASIX) 200 mg in dextrose 5 % 100 mL (2 mg/mL) infusion 30 mg/hr (10/02/23 7425)   milrinone 0.25 mcg/kg/min (10/02/23 1314)   promethazine (PHENERGAN) injection (IM or IVPB) 6.25 mg (10/02/23 1519)     LOS: 10 days    Time spent:    Zannie Cove, MD [facility]   10/02/2023, 4:14 PM

## 2023-10-02 NOTE — Progress Notes (Addendum)
 Progress Note   Patient: Karen Bennett ZOX:[id number] DOB: 16-Nov-1975 DOA: 09/22/2023     10 DOS: the patient was seen and examined on 10/02/2023   Brief hospital course: Karen Bennett was admitted to the hospital with the working diagnosis of heart failure decompensation.   48 year old female with history of obesity class 3, chronic systolic CHF, HTN, HLD, IDDM, hypothyroidism, prior DVT/PE, history of Candida mitral valve endocarditis in 2022, pulmonary hypertension, CKD 3B, right BKA, prior CVA, who comes into the hospital with worsening abdominal distention, lower extremity swelling and shortness of breath.   She was recently hospitalized and discharged in January 2025 for acute on chronic systolic CHF, required significant diuresis with milrinone.  Since discharge she has been doing well, compliant with her home medications as well as a low-sodium diet, but over the last several days-a week she has noticed increased swelling, decreased urination, and worsening shortness of breath..  In the ED she was found to have volume overload, acute kidney injury, and hyperkalemia.   Patient was placed on IV furosemide, and inotropic support with milrinone.  03/07 transferred to [facility] from [facility] for advance heart failure management.   Started on furosemide drip for further diuresis.   03/11 continue volume overload, not back to baseline.   Assessment and Plan: * Acute on chronic systolic heart failure (HCC) Echocardiogram with reduced LV systolic function EF 25 to 30%, severe LV cavity dilatation, interventricular septum flattened in systole, RV systolic function with moderate reduction, RVSP 78.2 RA with mild dilatation,  LV entire apex akinetic.  No significant valvular disease.   Acute on chronic core pulmonale Pulmonary hypertension  RV failure.   Urine output 2,350 ml SV02 79.9  Systolic blood pressure 110 mmHg range.   Inotropic support with milrinone 0,25 mcg/kg/min  Furosemide drip 30  ml per hr.  Acetazolamide 250 mg bid and metolazone 5 mg bid.   No SGLT 2 inh due to risk of urine and perineal skin infections.  Patient has a foley cathter in place.  Holding RAAS due to acute worsening in GFR.   Frequent PVC, continue with mexiletine.   Acute kidney injury superimposed on stage 4 chronic kidney disease (HCC) AKI, Hyponatremia, hypokalemia. (Base serum cr 2,0) Stage 4 and not stage 3 CKD.   Renal function today with serum cr at 2,87 with K at 3,3 and serum bicarbonate at 26  Na 131   Continue aggressive K correction with Kcl 40 meq x 3 doses.  Keep K 4 and Mg at 2.  Continue diuresis with furosemide, acetazolamide and metolazone.  Follow up renal function and electrolytes in am.   History of pulmonary embolism Continue anticoagulation with apixaban.   Type 2 diabetes mellitus with hyperlipidemia (HCC) Continue glucose cover and monitoring with insulin sliding scale.  Basal insulin with 10 units glargine.  Fasting glucose today 115  mg/dl.   Continue with ezetimibe and atorvastatin.   Hx of right BKA (HCC) PT and OT as tolerated.   Hypothyroidism Continue with levothyroxine   BMI 60.0-69.9, adult (HCC) Calculated BMI is 70.1   History of CVA (cerebrovascular accident) Continue blood pressure control and statin therapy.   Anxiety and depression Continue with as needed hydroxyzine    Subjective: Patient with improvement in dyspnea and edema but not back to her baseline, no chest pain, tolerating po well.   Physical Exam: Vitals:   10/02/23 0515 10/02/23 0519 10/02/23 0737 10/02/23 1133  BP: (!) 112/57  115/65 (!) 111/55  Pulse:  100  (!) 109 (!) 105  Resp:   20 19  Temp: 98 F (36.7 C)  98.1 F (36.7 C) 98.4 F (36.9 C)  TempSrc: Oral  Oral Oral  SpO2: 96%  94% 95%  Weight:  (!) 200.3 kg    Height:       Neurology awake and alert ENT with mild pallor Cardiovascular with S1 and S2 present and regular, positive S3 gallop, with no rubs or  murmurs No JVD (wide and short neck) Respiratory with scattered rales on anterior auscultation  Abdomen with no distention  Positive left lower extremity edema +++, right BKA with thigh edema.,  Data Reviewed:    Family Communication: no family at the bedside   Disposition: Status is: Inpatient Remains inpatient appropriate because: IV diuresis and inotropics   Planned Discharge Destination: Home     Author: Coralie Keens, MD 10/02/2023 2:33 PM  For on call review [email or web address].

## 2023-10-03 DIAGNOSIS — E875 Hyperkalemia: Secondary | ICD-10-CM

## 2023-10-03 DIAGNOSIS — I5023 Acute on chronic systolic (congestive) heart failure: Secondary | ICD-10-CM | POA: Diagnosis not present

## 2023-10-03 DIAGNOSIS — Z7189 Other specified counseling: Secondary | ICD-10-CM | POA: Diagnosis not present

## 2023-10-03 DIAGNOSIS — Z515 Encounter for palliative care: Secondary | ICD-10-CM | POA: Diagnosis not present

## 2023-10-03 DIAGNOSIS — R600 Localized edema: Secondary | ICD-10-CM | POA: Diagnosis not present

## 2023-10-03 DIAGNOSIS — E871 Hypo-osmolality and hyponatremia: Secondary | ICD-10-CM

## 2023-10-03 DIAGNOSIS — I509 Heart failure, unspecified: Secondary | ICD-10-CM

## 2023-10-03 LAB — BASIC METABOLIC PANEL
Anion gap: 9 (ref 5–15)
Anion gap: 12 (ref 5–15)
BUN: 75 mg/dL — ABNORMAL HIGH (ref 6–20)
BUN: 75 mg/dL — ABNORMAL HIGH (ref 6–20)
CO2: 28 mmol/L (ref 22–32)
CO2: 25 mmol/L (ref 22–32)
Calcium: 8 mg/dL — ABNORMAL LOW (ref 8.9–10.3)
Calcium: 8.1 mg/dL — ABNORMAL LOW (ref 8.9–10.3)
Chloride: 93 mmol/L — ABNORMAL LOW (ref 98–111)
Chloride: 94 mmol/L — ABNORMAL LOW (ref 98–111)
Creatinine, Ser: 2.85 mg/dL — ABNORMAL HIGH (ref 0.44–1.00)
Creatinine, Ser: 2.66 mg/dL — ABNORMAL HIGH (ref 0.44–1.00)
GFR, Estimated: 20 mL/min — ABNORMAL LOW (ref >60)
GFR, Estimated: 22 mL/min — ABNORMAL LOW (ref >60)
Glucose, Bld: 106 mg/dL — ABNORMAL HIGH (ref 70–99)
Glucose, Bld: 126 mg/dL — ABNORMAL HIGH (ref 70–99)
Potassium: 2.7 mmol/L — CL (ref 3.5–5.1)
Potassium: 3.1 mmol/L — ABNORMAL LOW (ref 3.5–5.1)
Sodium: 130 mmol/L — ABNORMAL LOW (ref 135–145)
Sodium: 131 mmol/L — ABNORMAL LOW (ref 135–145)

## 2023-10-03 LAB — COOXEMETRY PANEL
Carboxyhemoglobin: 1.9 % — ABNORMAL HIGH (ref 0.5–1.5)
Methemoglobin: <0.7 % (ref 0.0–1.5)
O2 Saturation: 75.2 %
Total hemoglobin: 9.5 g/dL — ABNORMAL LOW (ref 12.0–16.0)

## 2023-10-03 LAB — GLUCOSE, CAPILLARY
Glucose-Capillary: 116 mg/dL — ABNORMAL HIGH (ref 70–99)
Glucose-Capillary: 140 mg/dL — ABNORMAL HIGH (ref 70–99)
Glucose-Capillary: 111 mg/dL — ABNORMAL HIGH (ref 70–99)
Glucose-Capillary: 124 mg/dL — ABNORMAL HIGH (ref 70–99)

## 2023-10-03 LAB — MAGNESIUM: Magnesium: 2 mg/dL (ref 1.7–2.4)

## 2023-10-03 MED ORDER — POTASSIUM CHLORIDE 10 MEQ/50ML IV SOLN
10.0000 meq | INTRAVENOUS | Status: AC
  Administered 2023-10-03 (×2): 10 meq via INTRAVENOUS
  Filled 2023-10-03 (×2): qty 50

## 2023-10-03 MED ORDER — POTASSIUM CHLORIDE 20 MEQ PO PACK
60.0000 meq | PACK | ORAL | Status: AC
  Administered 2023-10-03 (×3): 60 meq via ORAL
  Filled 2023-10-03 (×3): qty 3

## 2023-10-03 MED ORDER — SENNOSIDES-DOCUSATE SODIUM 8.6-50 MG PO TABS
1.0000 | ORAL_TABLET | Freq: Two times a day (BID) | ORAL | Status: DC
  Administered 2023-10-03 – 2023-10-07 (×5): 1 via ORAL
  Filled 2023-10-03 (×11): qty 1

## 2023-10-03 MED ORDER — POLYETHYLENE GLYCOL 3350 17 G PO PACK
17.0000 g | PACK | Freq: Every day | ORAL | Status: DC
  Administered 2023-10-03 – 2023-10-04 (×2): 17 g via ORAL
  Filled 2023-10-03 (×6): qty 1

## 2023-10-03 NOTE — Progress Notes (Signed)
 Orthopedic Tech Progress Note Patient Details:  ZOIEE WIMMER 08-Dec-1975 [id number]  Ortho Devices Type of Ortho Device: Ace wrap, Unna boot Ortho Device/Splint Location: LLE Ortho Device/Splint Interventions: Ordered, Application, Adjustment   Post Interventions Patient Tolerated: Well Instructions Provided: Care of device  Donald Pore 10/03/2023, 2:22 PM

## 2023-10-03 NOTE — Progress Notes (Addendum)
 Advanced Heart Failure Rounding Note  Cardiologist: Chilton Si, MD  Chief Complaint: acute on chronic systolic heart failure Subjective:    On milrinone 0.25 + Lasix gtt at 30/hr. Negative 1.2 liters    Denies SOB.  .   Objective:   Weight Range: (!) 202.4 kg Body mass index is 72.02 kg/m.   Vital Signs:   Temp:  [97.7 F (36.5 C)-98.6 F (37 C)] 97.7 F (36.5 C) (03/12 0502) Pulse Rate:  [98-109] 98 (03/12 0502) Resp:  [18-20] 18 (03/11 1938) BP: (101-121)/(54-65) 108/60 (03/12 0502) SpO2:  [93 %-96 %] 93 % (03/12 0502) Weight:  [202.4 kg] 202.4 kg (03/12 0502) Last BM Date : 09/30/23  Weight change: Filed Weights   10/01/23 0510 10/02/23 0519 10/03/23 0502  Weight: (!) 198.9 kg (!) 200.3 kg (!) 202.4 kg    Intake/Output:   Intake/Output Summary (Last 24 hours) at 10/03/2023 0711 Last data filed at 10/03/2023 0504 Gross per 24 hour  Intake 478 ml  Output 1750 ml  Net -1272 ml    CVP 12-13   Physical Exam  General:   No resp difficulty Neck: supple. JVP difficult to assess due to body habitus. .  Cor: PMI nondisplaced. Regular rate & rhythm. No rubs, gallops or murmurs. Lungs: clear Abdomen: soft, nontender, nondistended.  Extremities: no cyanosis, clubbing, rash, 1+ edema. R BKA  Neuro: alert & oriented x3  Telemetry   SR-ST 90-100s   EKG    No new EKG to review  Labs    CBC No results for input(s): "WBC", "NEUTROABS", "HGB", "HCT", "MCV", "PLT" in the last 72 hours.  Basic Metabolic Panel Recent Labs    16/10/96 1745 10/02/23 0536  NA 130* 131*  K 3.3* 3.3*  CL 93* 93*  CO2 27 26  GLUCOSE 115* 121*  BUN 73* 73*  CREATININE 2.74* 2.87*  CALCIUM 8.1* 8.6*   Liver Function Tests No results for input(s): "AST", "ALT", "ALKPHOS", "BILITOT", "PROT", "ALBUMIN" in the last 72 hours. No results for input(s): "LIPASE", "AMYLASE" in the last 72 hours. Cardiac Enzymes No results for input(s): "CKTOTAL", "CKMB", "CKMBINDEX",  "TROPONINI" in the last 72 hours.  BNP: BNP (last 3 results) Recent Labs    02/09/23 1736 08/01/23 1140 09/22/23 0215  BNP 345.1* 523.3* 572.3*    ProBNP (last 3 results) No results for input(s): "PROBNP" in the last 8760 hours.   D-Dimer No results for input(s): "DDIMER" in the last 72 hours. Hemoglobin A1C No results for input(s): "HGBA1C" in the last 72 hours. Fasting Lipid Panel No results for input(s): "CHOL", "HDL", "LDLCALC", "TRIG", "CHOLHDL", "LDLDIRECT" in the last 72 hours. Thyroid Function Tests No results for input(s): "TSH", "T4TOTAL", "T3FREE", "THYROIDAB" in the last 72 hours.  Invalid input(s): "FREET3"  Other results:   Imaging    No results found.   Medications:     Scheduled Medications:  acetaZOLAMIDE  250 mg Oral BID   apixaban  5 mg Oral BID   atorvastatin  40 mg Oral QPM   clotrimazole  1 Application Topical BID   ezetimibe  10 mg Oral Daily   insulin aspart  0-15 Units Subcutaneous TID WC   insulin aspart  0-5 Units Subcutaneous QHS   insulin glargine  10 Units Subcutaneous QHS   levothyroxine  75 mcg Oral QAC breakfast   metolazone  5 mg Oral BID   mexiletine  250 mg Oral Q12H   sodium chloride flush  10-40 mL Intracatheter Q12H  Infusions:  furosemide (LASIX) 200 mg in dextrose 5 % 100 mL (2 mg/mL) infusion 30 mg/hr (10/03/23 0335)   milrinone 0.25 mcg/kg/min (10/03/23 0203)   promethazine (PHENERGAN) injection (IM or IVPB) 6.25 mg (10/02/23 2203)    PRN Medications: acetaminophen **OR** acetaminophen, diphenhydrAMINE-zinc acetate, Gerhardt's butt cream, hydrOXYzine, mineral oil-hydrophilic petrolatum, mouth rinse, oxyCODONE, prochlorperazine, promethazine (PHENERGAN) injection (IM or IVPB), sodium chloride flush    Patient Profile   [name] with CKD stage III, hypothyroidism, mitral valve endocarditis in 2022, history of CVA, right BKA, severe morbid obesity (BMI 70) and chronic biventricular failure currently admitted  with heart failure exacerbation.   Assessment/Plan   1. A/C Biventricular HF EF continues to drop. Previous Echo EF 35-40% EF but now 25-30% + RV failure. Etiology unclear. ? PVC induced? She has not had ischemic work up due to weight/CKD.  Readmitted with marked volume overload. Poor response with intermittent IV lasix and IV bumex. Milrinone added to augment diuresis.  - remains on milrinone 0.25, CO-OX stable.  - CVP 12-13. Cannot rely bed weights.  - continue lasix gtt 30 + metolazone 5 mg bid  until creatinine bumps. Will likely need RHC prior to d/c. - GDMT limited by hypotension and AKI    2. CKD  Stage IV on AKI  - SCr 3.2 on admit, b/l ~2.0  - Scr  BMET pending.  - continue milrinone + diuresis per above - follow BMP    3. Hypothyroidism  - Recent TSH stable.    4. PVCs  Continue mexiletine 250 every 12 hours.    5. H/O PE/DVT - Continue eliquis 5 mg bid.    6.Suspected OHS/OSA - Refuses sleep study   7. Severe Morbid Obesity.  DMII. HGb A1C 7.2 Consider GLP1 as an outpatient.  Body mass index is 69.65 kg/m.   8. Right BKA   9. T2DM - Hgb of 7.2 (improved from 12.2 in 2023) - schedule SSI - per IM   BMET Mag pending. Will need Palliative Care for GOC. Limited support. Needs HH at d/c. Poor insight regarding medical issues. Declines SNF.    Length of Stay: 11  Karen Clegg, NP  10/03/2023, 7:11 AM  Advanced Heart Failure Team Pager [phone] (M-F; 7a - 5p)  Please contact [facility] for night-coverage after hours (5p -7a ) and weekends on [email or web address]  Patient seen with NP, I formulated the plan and agree with the above note.   CVP 18 today on milrinone 0.25 and Lasix gtt 30 mg/hr.  Getting metolazone and acetazolamide.  I/Os net negative 1272 cc.   General: NAD Neck: JVP 16+, no thyromegaly or thyroid nodule.  Lungs: Clear to auscultation bilaterally with normal respiratory effort. CV: Nondisplaced PMI.  Heart regular S1/S2, no S3/S4, no murmur.  1+  edema to thigh on left.  Abdomen: Soft, nontender, no hepatosplenomegaly, no distention.  Skin: Intact without lesions or rashes.  Neurologic: Alert and oriented x 3.  Psych: Normal affect. Extremities: Right BKA HEENT: Normal.   Patient is still volume overloaded with CVP 18 on my read.  Creatinine stable with diuresis (2.74 => 2.72 => 2.87 => 2.85) but BUN stable, good co-ox 75%. GDMT limited by elevated creatinine, AKI on CKD stage IV.  I worry that renal function is going to be a limiting factor to our ability to diurese her.  Still not vigorous UOP.   - Continue milrinone 0.25 while diuresing.  - Lasix 30 mg/hr + metolazone 5 bid + acetazolamide 250  mg bid again today.  - Replace K    EF lower on echo this admission at 25-30%.  No chest pain.  No cath for now with AKI.    Continue apixaban for h/o PE/DVT.    Continue mexiletine for PVCs.   Marca Ancona 10/03/2023 10:17 AM]

## 2023-10-03 NOTE — Consult Note (Signed)
 Consultation Note Date: 10/03/2023 at 1100  Patient Name: Karen Bennett  DOB: [birth year]  MRN: [id number]  Age / Sex: 48 y.o., female  PCP: [name], NP Referring Physician: Zannie Cove, MD  HPI/Patient Profile: 48 y.o. female  with past medical history of obesity, chronic systolic CHF, HTN, HLD, IDDM, hypothyroidism, prior DVT/PE, history of Candida mitral valve endocarditis in 2022, pulmonary hypertension, CKD 3B, right BKA, prior CV. She presented to [facility] ED 09/22/23 c/o worsening abdominal distention, lower extremity swelling and shortness of breath. ED workup found volume overload, AKI and hyperkalemia, admitted and placed on IV furosemide and inotropic support with milrinone. She was subsequently transferred to [facility] 09/28/23.   Palliative care consulted to discuss goals of care.   Clinical Assessment and Goals of Care: Extensive chart review completed prior to meeting patient including labs, vital signs, imaging, progress notes, orders, and available advanced directive documents from current and previous encounters. I then met with [place] to discuss diagnosis prognosis, GOC, EOL wishes, disposition and options.  Patient resting in bed. She is A&O, calm and pleasant. She c/o one episode of emesis this am after trying to swallow potassium pill. She complains of R sided intermittent chest pain, chronic R phantom leg pain and chronic LLE pain. She is in no distress.   I introduced Palliative Medicine as specialized medical care for people living with serious illness. It focuses on providing relief from the symptoms and stress of a serious illness. The goal is to improve quality of life for both the patient and the family.  We discussed a brief life review of the patient. Raiya shares that she has always lived in [place]. She is not married and never had children. Brunella shares that her previous career was  working at a call center. She is now disabled. She has one sister, Kayleen Memos, that she has not spoken to in a long time. Parents are deceased with no other family members. She names two friends that are her contacts, Adan Sis, home health provider for 2-3 years and Jarold Song, friend.   As far as functional and nutritional status Brena resides in a handicap assessable apartment. She has lived there for approximately four years. She has a motorized wheelchair that she uses around the apartment. Reports being able to do transfer from bed to wheelchair independently. She is able to ride the medical transport bus and also utilizes public transportation for shopping and doctors visits. Delsy reports she has CAP benefit that allows a caregiver in her home for approximately 9-10 hours/ day most days of the week. 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   We discussed patient's current illness and what it means in the larger context of patient's on-going co-morbidities.  Natural disease trajectory and expectations at EOL were discussed.  I attempted to elicit values and goals of care important to the patient. It is important to the patient to get back to a better state of health. She shares that she has been  compliant with medications, stopped eating fast food and drinking soft drinks. She is discouraged as to why she has experienced this CHF exacerbation. We discussed decline in heart function since her last admit. She responds that no one has told her that her heart function was worse.  Keili endorses wanting to start PT so that she will continue to have the same level of independence prior to this hospitalization. Order placed for PT/OT evaluation.   Discussed with patient the importance of continued conversation with family and the medical providers regarding overall plan of care and treatment options, ensuring decisions are within the context of the patient's values and  GOCs.    Questions and concerns were addressed. The patient was encouraged to call with questions or concerns.   Primary Decision Maker PATIENT  Physical Exam Vitals reviewed.  Constitutional:      General: She is not in acute distress.    Appearance: She is obese. She is not ill-appearing.  Pulmonary:     Effort: Pulmonary effort is normal. No respiratory distress.  Musculoskeletal:     Left lower leg: Edema present.     Comments: R BKA  Previous amputation of L fourth/fifth toes  Skin:    General: Skin is warm and dry.     Comments: Redness to LLE with some blistering around ankle.   Neurological:     Mental Status: She is alert and oriented to person, place, and time.  Psychiatric:        Mood and Affect: Mood normal.        Behavior: Behavior normal.        Thought Content: Thought content normal.        Judgment: Judgment normal.     Palliative Assessment/Data: 70%     Thank you for this consult. Palliative medicine will continue to follow and assist holistically.   Time Total: 75 minutes  Time spent includes: Detailed review of medical records (labs, imaging, vital signs), medically appropriate exam (mental status, respiratory, cardiac, skin), discussed with treatment team, counseling and educating patient, family and staff, documenting clinical information, medication management and coordination of care.     Alex Gardener, Juel Burrow- [facility] Palliative Medicine Team  10/03/2023 12:42 PM  Office [phone]  Pager [phone]     Please contact Palliative Medicine Team providers via AMION for questions and concerns.

## 2023-10-04 DIAGNOSIS — I5023 Acute on chronic systolic (congestive) heart failure: Secondary | ICD-10-CM | POA: Diagnosis not present

## 2023-10-04 DIAGNOSIS — R600 Localized edema: Secondary | ICD-10-CM | POA: Diagnosis not present

## 2023-10-04 DIAGNOSIS — N179 Acute kidney failure, unspecified: Secondary | ICD-10-CM | POA: Diagnosis not present

## 2023-10-04 DIAGNOSIS — Z515 Encounter for palliative care: Secondary | ICD-10-CM | POA: Diagnosis not present

## 2023-10-04 DIAGNOSIS — Z7189 Other specified counseling: Secondary | ICD-10-CM | POA: Diagnosis not present

## 2023-10-04 LAB — GLUCOSE, CAPILLARY
Glucose-Capillary: 124 mg/dL — ABNORMAL HIGH (ref 70–99)
Glucose-Capillary: 106 mg/dL — ABNORMAL HIGH (ref 70–99)
Glucose-Capillary: 137 mg/dL — ABNORMAL HIGH (ref 70–99)
Glucose-Capillary: 129 mg/dL — ABNORMAL HIGH (ref 70–99)

## 2023-10-04 LAB — BASIC METABOLIC PANEL
Anion gap: 9 (ref 5–15)
Anion gap: 15 (ref 5–15)
BUN: 75 mg/dL — ABNORMAL HIGH (ref 6–20)
BUN: 76 mg/dL — ABNORMAL HIGH (ref 6–20)
CO2: 29 mmol/L (ref 22–32)
CO2: 25 mmol/L (ref 22–32)
Calcium: 8.3 mg/dL — ABNORMAL LOW (ref 8.9–10.3)
Calcium: 8.2 mg/dL — ABNORMAL LOW (ref 8.9–10.3)
Chloride: 93 mmol/L — ABNORMAL LOW (ref 98–111)
Chloride: 90 mmol/L — ABNORMAL LOW (ref 98–111)
Creatinine, Ser: 2.62 mg/dL — ABNORMAL HIGH (ref 0.44–1.00)
Creatinine, Ser: 2.6 mg/dL — ABNORMAL HIGH (ref 0.44–1.00)
GFR, Estimated: 22 mL/min — ABNORMAL LOW (ref >60)
GFR, Estimated: 22 mL/min — ABNORMAL LOW (ref >60)
Glucose, Bld: 122 mg/dL — ABNORMAL HIGH (ref 70–99)
Glucose, Bld: 135 mg/dL — ABNORMAL HIGH (ref 70–99)
Potassium: 3.3 mmol/L — ABNORMAL LOW (ref 3.5–5.1)
Potassium: 3.1 mmol/L — ABNORMAL LOW (ref 3.5–5.1)
Sodium: 131 mmol/L — ABNORMAL LOW (ref 135–145)
Sodium: 130 mmol/L — ABNORMAL LOW (ref 135–145)

## 2023-10-04 LAB — COOXEMETRY PANEL
Carboxyhemoglobin: 1.8 % — ABNORMAL HIGH (ref 0.5–1.5)
Methemoglobin: <0.7 % (ref 0.0–1.5)
O2 Saturation: 60.4 %
Total hemoglobin: 9.9 g/dL — ABNORMAL LOW (ref 12.0–16.0)

## 2023-10-04 LAB — MAGNESIUM: Magnesium: 1.9 mg/dL (ref 1.7–2.4)

## 2023-10-04 MED ORDER — ACETAZOLAMIDE 250 MG PO TABS
500.0000 mg | ORAL_TABLET | Freq: Two times a day (BID) | ORAL | Status: DC
  Administered 2023-10-04 – 2023-10-11 (×15): 500 mg via ORAL
  Filled 2023-10-04 (×16): qty 2

## 2023-10-04 MED ORDER — POTASSIUM CHLORIDE 20 MEQ PO PACK
60.0000 meq | PACK | ORAL | Status: AC
  Administered 2023-10-04 – 2023-10-05 (×3): 60 meq via ORAL
  Filled 2023-10-04 (×3): qty 3

## 2023-10-04 MED ORDER — METOLAZONE 2.5 MG PO TABS
10.0000 mg | ORAL_TABLET | Freq: Two times a day (BID) | ORAL | Status: DC
  Administered 2023-10-04 – 2023-10-13 (×18): 10 mg via ORAL
  Filled 2023-10-04 (×18): qty 4

## 2023-10-04 MED ORDER — POTASSIUM CHLORIDE 20 MEQ PO PACK
60.0000 meq | PACK | ORAL | Status: AC
Start: 2023-10-04 — End: 2023-10-04
  Administered 2023-10-04 (×2): 60 meq via ORAL
  Filled 2023-10-04 (×2): qty 3

## 2023-10-04 NOTE — Progress Notes (Signed)
 This chaplain responded to PMT NP-Susan's consult for spiritual care. The chaplain understands the Pt. is requesting support. The Pt. is awake and accepting of the chaplain's visit.  The chaplain listened reflectively to the importance of art and creativity in the Pt. life. Time was spent looking at portfolio pictures and glancing at the art supplies the Pt. brought to the hospital. Making jewelry is a source of income and a tool for processing decisions and emotions. The chaplain understands the Pt. plans to reconnect to "coloring" at the hospital to process the conversations from the medical team .  The chaplain understands communication is essential from the medical team. The Pt. explains her decisions are rarely a yes or no answer in the moment. The "decision process" is much more important to her after receiving all the information. The Pt. is interested in completing ACP to relieve any one of having to make medical decisions for the Pt.  The Pt. accepted the chaplain's invitation for F/U spiritual care.  Chaplain Stephanie Acre [phone]

## 2023-10-04 NOTE — Progress Notes (Addendum)
 PROGRESS NOTE    Karen Bennett  WUJ:[id number] DOB: 1975/09/12 DOA: 09/22/2023 PCP: [name], NP  47/F w obesity class 3, chronic systolic CHF, HTN, HLD, IDDM, hypothyroidism, prior DVT/PE, history of Candida mitral valve endocarditis in 2022, pulmonary hypertension, CKD 3B, right BKA, prior CVA, presented to ED with worsening abdominal distention, lower extremity swelling and shortness of breath.  -recently hospitalized and discharged in January 2025 for acute on chronic systolic CHF, required significant diuresis with milrinone.  -In the ED she was found to have volume overload, acute kidney injury, and hyperkalemia.  -placed on IV furosemide, and inotropic support with milrinone.  -3/07 transferred to [facility]   Subjective: Feels fair, no events overnight  Assessment and Plan:  Acute on chronic systolic heart failure (HCC) BiV Failure Pulm HTN -Echo w/ EF 25 to 30%, severe LV cavity dilatation, RV moderately reduced, RVSP 78.2  -CHF team following, diuresing on Lasix gtt. 30 Mg per hour and milrinone -on metolazone as well, poor response to aggressive diuretics so far -GDMT limited by AKI/CKD -poor SGLT2I candidate and has foley  -Palliative consulted by heart failure team for goals of care, overall poor prognosis with limited support -Reattempt nightly BiPAP  Severe hypokalemia -Replaced  Frequent PVC,  -continue mexiletine, monitor lytes  AKI on CKD4 Hyponatremia, hypokalemia. (Base serum cr 2,0 -creat was 3.2 on admission, now 2.6 on milrinone   History of pulmonary embolism Continue apixaban.   Type 2 diabetes mellitus  -CBGs are stable on current dose of glargine  Hx of right BKA (HCC) PT and OT consulted, she uses a power chair at baseline and transfers to the toilet, declines SNF  Hypothyroidism Continue with levothyroxine   Morbid Obesity OSA/OHS -declined sleep study, needs significant wt loss -Attempt nighttime BiPAP  History of CVA  (cerebrovascular accident) -Continue apixaban and statin  Anxiety and depression Continue with as needed hydroxyzine    DVT prophylaxis: apixaban Code Status: Full Code Family Communication: None present Disposition Plan: Home likely 3 to 4 days  Consultants:    Procedures:   Antimicrobials:    Objective: Vitals:   10/03/23 2230 10/04/23 0400 10/04/23 0633 10/04/23 0907  BP: 107/60 (!) 113/59  (!) 126/101  Pulse: 100 (!) 107 98 97  Resp: 18 18    Temp: 99.3 F (37.4 C) 98.5 F (36.9 C)  98.8 F (37.1 C)  TempSrc: Oral Oral  Oral  SpO2: 93% 93% 91% 92%  Weight:  (!) 199.7 kg    Height:        Intake/Output Summary (Last 24 hours) at 10/04/2023 1107 Last data filed at 10/04/2023 0910 Gross per 24 hour  Intake 3030.5 ml  Output 1750 ml  Net 1280.5 ml   Filed Weights   10/02/23 0519 10/03/23 0502 10/04/23 0400  Weight: (!) 200.3 kg (!) 202.4 kg (!) 199.7 kg    Examination:  Gen: Awake, Alert, Oriented X morbidly obese chronically ill HEENT: Neck obese unable to assess JVD Lungs: Decreased breath sounds at the bases CVS: S1S2/RRR Abd: soft, Non tender, non distended, BS present Extremities: 1-2+ edema, R BKA, UNNA boots Skin: no new rashes on exposed skin   Data Reviewed:   CBC: Recent Labs  Lab 09/30/23 0455  WBC 3.8*  HGB 9.5*  HCT 29.9*  MCV 86.2  PLT 164   Basic Metabolic Panel: Recent Labs  Lab 09/28/23 0340 09/29/23 0535 09/30/23 0455 09/30/23 1702 10/01/23 1745 10/02/23 0536 10/03/23 0713 10/03/23 1400 10/04/23 0400  NA 131* 125*  128*   < > 130* 131* 130* 131* 131*  K 3.5 3.2* 3.2*   < > 3.3* 3.3* 2.7* 3.1* 3.3*  CL 94* 92* 91*   < > 93* 93* 93* 94* 93*  CO2 27 23 23    < > 27 26 28 25 29   GLUCOSE 100* 218* 164*   < > 115* 121* 106* 126* 122*  BUN 75* 73* 73*   < > 73* 73* 75* 75* 75*  CREATININE 3.01* 2.90* 2.78*   < > 2.74* 2.87* 2.85* 2.66* 2.62*  CALCIUM 8.2* 7.7* 8.0*   < > 8.1* 8.6* 8.0* 8.1* 8.3*  MG 2.1 2.0 2.0  --   --    --  2.0  --  1.9   < > = values in this interval not displayed.   GFR: Estimated Creatinine Clearance: 48.9 mL/min (A) (by C-G formula based on SCr of 2.62 mg/dL (H)). Liver Function Tests: No results for input(s): "AST", "ALT", "ALKPHOS", "BILITOT", "PROT", "ALBUMIN" in the last 168 hours.  No results for input(s): "LIPASE", "AMYLASE" in the last 168 hours. No results for input(s): "AMMONIA" in the last 168 hours. Coagulation Profile: No results for input(s): "INR", "PROTIME" in the last 168 hours. Cardiac Enzymes: No results for input(s): "CKTOTAL", "CKMB", "CKMBINDEX", "TROPONINI" in the last 168 hours. BNP (last 3 results) No results for input(s): "PROBNP" in the last 8760 hours. HbA1C: No results for input(s): "HGBA1C" in the last 72 hours. CBG: Recent Labs  Lab 10/03/23 0806 10/03/23 1144 10/03/23 1619 10/03/23 2122 10/04/23 0612  GLUCAP 116* 140* 111* 124* 124*   Lipid Profile: No results for input(s): "CHOL", "HDL", "LDLCALC", "TRIG", "CHOLHDL", "LDLDIRECT" in the last 72 hours. Thyroid Function Tests: No results for input(s): "TSH", "T4TOTAL", "FREET4", "T3FREE", "THYROIDAB" in the last 72 hours. Anemia Panel: No results for input(s): "VITAMINB12", "FOLATE", "FERRITIN", "TIBC", "IRON", "RETICCTPCT" in the last 72 hours. Urine analysis:    Component Value Date/Time   COLORURINE AMBER (A) 03/22/2023 0426   APPEARANCEUR TURBID (A) 03/22/2023 0426   LABSPEC 1.040 (H) 03/22/2023 0426   PHURINE 5.0 03/22/2023 0426   GLUCOSEU >=500 (A) 03/22/2023 0426   HGBUR LARGE (A) 03/22/2023 0426   BILIRUBINUR NEGATIVE 03/22/2023 0426   KETONESUR NEGATIVE 03/22/2023 0426   PROTEINUR >=300 (A) 03/22/2023 0426   UROBILINOGEN 0.2 09/05/2011 0558   NITRITE POSITIVE (A) 03/22/2023 0426   LEUKOCYTESUR MODERATE (A) 03/22/2023 0426   Sepsis Labs: @LABRCNTIP (procalcitonin:4,lacticidven:4)  ) Recent Results (from the past 240 hours)  MRSA Next Gen by PCR, Nasal     Status: None    Collection Time: 09/24/23  4:49 PM   Specimen: Nasal Mucosa; Nasal Swab  Result Value Ref Range Status   MRSA by PCR Next Gen NOT DETECTED NOT DETECTED Final    Comment: (NOTE) The GeneXpert MRSA Assay (FDA approved for NASAL specimens only), is one component of a comprehensive MRSA colonization surveillance program. It is not intended to diagnose MRSA infection nor to guide or monitor treatment for MRSA infections. Test performance is not FDA approved in patients less than 50 years old. Performed at [facility], [address] [place], [place] [address]   MRSA Next Gen by PCR, Nasal     Status: None   Collection Time: 09/27/23 10:54 PM   Specimen: Nasal Mucosa; Nasal Swab  Result Value Ref Range Status   MRSA by PCR Next Gen NOT DETECTED NOT DETECTED Final    Comment: (NOTE) The GeneXpert MRSA Assay (FDA approved  for NASAL specimens only), is one component of a comprehensive MRSA colonization surveillance program. It is not intended to diagnose MRSA infection nor to guide or monitor treatment for MRSA infections. Test performance is not FDA approved in patients less than 32 years old. Performed at [facility], [address] [place], [place] [address]      Radiology Studies: No results found.   Scheduled Meds:  acetaZOLAMIDE  500 mg Oral BID   apixaban  5 mg Oral BID   atorvastatin  40 mg Oral QPM   clotrimazole  1 Application Topical BID   ezetimibe  10 mg Oral Daily   insulin aspart  0-15 Units Subcutaneous TID WC   insulin aspart  0-5 Units Subcutaneous QHS   insulin glargine  10 Units Subcutaneous QHS   levothyroxine  75 mcg Oral QAC breakfast   metolazone  5 mg Oral BID   mexiletine  250 mg Oral Q12H   polyethylene glycol  17 g Oral Daily   potassium chloride  60 mEq Oral Q4H   senna-docusate  1 tablet Oral BID   sodium chloride flush  10-40 mL Intracatheter Q12H   Continuous Infusions:  furosemide (LASIX) 200 mg in dextrose 5 %  100 mL (2 mg/mL) infusion 30 mg/hr (10/04/23 0731)   milrinone 0.25 mcg/kg/min (10/04/23 0730)   promethazine (PHENERGAN) injection (IM or IVPB) 6.25 mg (10/04/23 0841)     LOS: 12 days    Time spent:    Zannie Cove, MD [facility]   10/04/2023, 11:07 AM

## 2023-10-04 NOTE — Progress Notes (Signed)
 Patient didn't finish 50% of potassium this am, patient refused mid day potassium due to still having the first, patient accepted afternoon dose but didn't finish completely.

## 2023-10-04 NOTE — Progress Notes (Signed)
 Palliative Care Progress Note, Assessment & Plan   Patient Name: Karen Bennett       Date: 10/04/2023 DOB: [birth year]  Age: 48 y.o. MRN#: [id number] Attending Physician: Zannie Cove, MD Primary Care Physician: [name], NP Admit Date: 09/22/2023  Subjective: Resting in bed with lights out. She endorses CP, L leg pain- has some relief with pain medication. SOB persists.   HPI: 48 y.o. female  with past medical history of obesity, chronic systolic CHF, HTN, HLD, IDDM, hypothyroidism, prior DVT/PE, history of Candida mitral valve endocarditis in 2022, pulmonary hypertension, CKD 3B, right BKA, prior CV. She presented to [facility] ED 09/22/23 c/o worsening abdominal distention, lower extremity swelling and shortness of breath. ED workup found volume overload, AKI and hyperkalemia, admitted and placed on IV furosemide and inotropic support with milrinone. She was subsequently transferred to [facility] 09/28/23.    Palliative care consulted to discuss goals of care.  Summary of counseling/coordination of care: Extensive chart review completed prior to meeting patient including labs, vital signs, imaging, progress notes, orders, and available advanced directive documents from current and previous encounters.   After reviewing the patient's chart and assessing the patient at bedside, I spoke with patient in regards to symptom management and goals of care.   Pt resting in bed. She is alert, oriented and calm. She is in no distress. She appears to be more somnolent than yesterday. She states heart team came and talked to her this morning but was reluctant to discuss what she was told.   Discussion was had regarding code status, CPR and intubation. She understands what that means but still wants to remain a full code. Also  talked about appointing someone to make medical decisions for her in the event she is unable to make them herself. Jaid shares that she text her sister, Bethann Berkshire, yesterday and the conversation was "ok". They have not been in touch for a long time per [facility]. She adds that it is a lot to ask someone to make those hard decisions for you. Advised that her sister is next of kin which automatically makes her the decision maker if needed. Asked about other friends or family she could appoint as HPOA. She says there is no one else since her friend is moving back to [place].   Krystena was asked to share what she understands about her medical situation. She responds by shrugging her shoulders. Educated that her heart function has continued to worsen and she has had poor response to fluid removal. She does not have response to this information.   Educated Averee that there are outpatient resources in the community that offer support just as I do in the hospital, available to come to her home approximately once per month. She expresses wanting outpatient palliative services to follow her once she is discharged.   Therapeutic silence and active listening provided for patient to share her thoughts and emotions regarding current medical situation.  Emotional support provided.  Physical Exam Vitals reviewed.  Constitutional:      General: She is not in acute distress.    Appearance: She is obese. She is ill-appearing.  Pulmonary:     Effort: Pulmonary effort is normal. No  respiratory distress.  Skin:    General: Skin is warm and dry.  Neurological:     Mental Status: She is alert and oriented to person, place, and time.  Psychiatric:        Mood and Affect: Mood normal.        Behavior: Behavior normal.        Thought Content: Thought content normal.        Judgment: Judgment normal.       1535: Alerted by Bernette Redbird, that patient may want to discuss advanced care planning at this time. Went to bedside  to see if patient was ready to talk about care planning for which she declined. She is open to PMT visit tomorrow for more discussion.         Total Time 50 minutes   Time spent includes: Detailed review of medical records (labs, imaging, vital signs), medically appropriate exam (mental status, respiratory, cardiac, skin), discussed with treatment team, counseling and educating patient, family and staff, documenting clinical information, medication management and coordination of care.     Alex Gardener, Juel Burrow- [facility] Palliative Medicine Team  10/04/2023 10:46 AM  Office [phone]  Pager [phone]

## 2023-10-04 NOTE — Progress Notes (Addendum)
 Physical Therapy Evaluation Patient Details Name: Karen Bennett MRN: [id number] DOB: 01-19-1976 Today's Date: 10/04/2023  History of Present Illness  Pt is a 48 y.o. female admitted to [facility] from [facility] on 09/28/23 with LE edema, SOB, and abdominal distention. Chest x-ray showed CHF w/ layering B pleural effusions. Admitted w/ acute on chronic HFrEF, AKI, and hyperkalemia. Prior admit 1/8-1/28 for CHF exacerbation requiring aggressive diuresis and inotropic support. PMH significant for DM2, HTN, HLD, hypothyroidism, CKD IIIa, obesity, Rt BKA, PE on Xarelto, DVT, endocarditis, cellulitis, lumbar radiculopathy, anxiety, depression, CVA.   Clinical Impression  Pt in bed upon arrival and agreeable to PT eval. PTA, pt would perform A/P transfers to a power WC with no assist needed. Pt has a PCA for ~8-9 hours a day, every day to assist with ADLs. In today's session, pt was agreeable to move into long-sitting with supervision for safety and use of bed rails. Pt was able to maintain balance without UE support for brief moments. Pt declined moving towards EOB or transferring due to increased swelling in B LE. She was agreeable to work on A/P transfers with therapy in future sessions. Pt currently with functional limitations due to the deficits listed below (see PT Problem List). Pt would benefit from acute skilled PT to address functional impairments. Recommending post-acute HHPT pending progress with mobility to work towards independence with mobility. Acute PT to follow.     SpO2 >92% on 3L, HR 97 BPM    If plan is discharge home, recommend the following: Assistance with cooking/housework;Assist for transportation;A little help with walking and/or transfers;A lot of help with bathing/dressing/bathroom     Equipment Recommendations None recommended by PT     Functional Status Assessment Patient has had a recent decline in their functional status and demonstrates the ability to make significant improvements in  function in a reasonable and predictable amount of time.     Precautions / Restrictions Precautions Precautions: Fall;Other (comment) Precaution/Restrictions Comments: h/o R BKA, pt reports is NWB due to spinter femur fx in 2018 Restrictions Weight Bearing Restrictions Per Provider Order: Yes RLE Weight Bearing Per Provider Order: Non weight bearing      Mobility  Bed Mobility Overal bed mobility: Needs Assistance Bed Mobility: Supine to Sit, Sit to Supine     Supine to sit: Supervision, HOB elevated, Used rails Sit to supine: Used rails, Supervision   General bed mobility comments: pt moved into long sitting with supervision and use of bed rails. Declined transfer to EOB due to increased B LE edema. Agreeable to transfer in future sessions       Balance Overall balance assessment: Needs assistance Sitting-balance support: No upper extremity supported Sitting balance-Leahy Scale: Fair Sitting balance - Comments: able to maintain balance in long sitting without hand support briefly. Prefers to hold onto rail for support        Pertinent Vitals/Pain Pain Assessment Pain Assessment: Faces Faces Pain Scale: Hurts even more Pain Location: R LE with movement Pain Descriptors / Indicators: Aching, Tightness Pain Intervention(s): Limited activity within patient's tolerance, Monitored during session, Repositioned    Home Living Family/patient expects to be discharged to:: Private residence Living Arrangements: Alone Available Help at Discharge: Personal care attendant (x7 days, ~8-9 hours a day) Type of Home: Apartment Home Access: Level entry      Home Layout: One level Home Equipment: Wheelchair - power;BSC/3in1;Hand held shower head;Grab bars - tub/shower Additional Comments: takes SCAT to appointments    Prior Function Prior Level of  Function : Needs assist      Mobility Comments: power chair for mobility, uses A/P scoot to transfer independently ADLs Comments: aid  helps with cooking and cleaning tasks IADLs, otherwise Mod Ind with ADLs     Extremity/Trunk Assessment   Upper Extremity Assessment Upper Extremity Assessment: Defer to OT evaluation    Lower Extremity Assessment Lower Extremity Assessment: RLE deficits/detail;LLE deficits/detail RLE Deficits / Details: able to contract quad, cannot lift hip antigravity. Reports increased pain with slight knee flexion. Edema LLE Deficits / Details: Grossly 3-/5. ROM limited due to body habitus with edema. Unna boot applied       Risk analyst: No apparent difficulties    Cognition Arousal: Alert Behavior During Therapy: WFL for tasks assessed/performed   PT - Cognitive impairments: No apparent impairments    PT - Cognition Comments: lethargic upon arrival, increased alertness throughout session Following commands: Intact       Cueing Cueing Techniques: Verbal cues      PT Assessment Patient needs continued PT services  PT Problem List Decreased strength;Decreased mobility;Cardiopulmonary status limiting activity;Decreased activity tolerance;Decreased knowledge of use of DME       PT Treatment Interventions DME instruction;Functional mobility training;Patient/family education;Therapeutic activities;Therapeutic exercise;Balance training;Neuromuscular re-education;Wheelchair mobility training    PT Goals (Current goals can be found in the Care Plan section)  Acute Rehab PT Goals Patient Stated Goal: to decrease swelling in legs and be able to move easier PT Goal Formulation: With patient Time For Goal Achievement: 10/18/23 Potential to Achieve Goals: Good    Frequency Min 2X/week        AM-PAC PT "6 Clicks" Mobility  Outcome Measure Help needed turning from your back to your side while in a flat bed without using bedrails?: None Help needed moving from lying on your back to sitting on the side of a flat bed without using bedrails?: A Little Help  needed moving to and from a bed to a chair (including a wheelchair)?: A Little Help needed standing up from a chair using your arms (e.g., wheelchair or bedside chair)?: Total Help needed to walk in hospital room?: Total Help needed climbing 3-5 steps with a railing? : Total 6 Click Score: 13    End of Session Equipment Utilized During Treatment: Oxygen Activity Tolerance: Other (comment) (pt declined transfers) Patient left: in bed;with call bell/phone within reach Nurse Communication: Mobility status PT Visit Diagnosis: Other abnormalities of gait and mobility (R26.89);Muscle weakness (generalized) (M62.81)    Time: 1610-9604 PT Time Calculation (min) (ACUTE ONLY): 19 min   Charges:   PT Evaluation $PT Eval Low Complexity: 1 Low   PT General Charges $$ ACUTE PT VISIT: 1 Visit       Hilton Cork, PT, DPT Secure Chat Preferred  Rehab Office [phone]   Arturo Morton Brion Aliment 10/04/2023, 3:00 PM

## 2023-10-04 NOTE — Progress Notes (Addendum)
 Advanced Heart Failure Rounding Note  Cardiologist: Chilton Si, MD  Chief Complaint: acute on chronic systolic heart failure Subjective:    Remains on milrinone 0.25, co-ox 64%.   Making urine but output not expected w/ high dose diuretics, only 1.7L out yesterday w/ lasix gtt at 30+ 5 bid of metolazone and 250 bid of diamox. I>O, overall net + 1.7L for the day. CVP 12-13   SCr 2.85>>2.62 today. BUN 75 (stable)  K 3.3   Complains of n/v. Getting phenergan. Denies dyspnea.    Objective:   Weight Range: (!) 199.7 kg Body mass index is 71.05 kg/m.   Vital Signs:   Temp:  [98 F (36.7 C)-99.3 F (37.4 C)] 98.5 F (36.9 C) (03/13 0400) Pulse Rate:  [98-107] 98 (03/13 0633) Resp:  [18-20] 18 (03/13 0400) BP: (92-113)/(53-74) 113/59 (03/13 0400) SpO2:  [91 %-94 %] 91 % (03/13 0633) Weight:  [199.7 kg] 199.7 kg (03/13 0400) Last BM Date : 09/30/23  Weight change: Filed Weights   10/02/23 0519 10/03/23 0502 10/04/23 0400  Weight: (!) 200.3 kg (!) 202.4 kg (!) 199.7 kg    Intake/Output:   Intake/Output Summary (Last 24 hours) at 10/04/2023 0839 Last data filed at 10/04/2023 0416 Gross per 24 hour  Intake 3030.5 ml  Output 1650 ml  Net 1380.5 ml     Physical Exam  CVP 12-13 General:  super morbid obesity, no respiratory difficulty  HEENT: normal Neck: supple. JVD 12 cm Carotids 2+ bilat; no bruits. No lymphadenopathy or thyromegaly appreciated. Cor: PMI nondisplaced. Regular rate & rhythm. No rubs, gallops or murmurs. Lungs: decreased BS at the bases bilaterally  Abdomen: obese, soft, nontender, nondistended.  Extremities: no cyanosis, clubbing, rash, s/p R BKA, LE edematous, + unna boot + LUE PICC  Neuro: alert & oriented x 3, cranial nerves grossly intact. moves all 4 extremities w/o difficulty. Affect pleasant.   Telemetry   Sinus tach 110s, personally reviewed   EKG    No new EKG to review  Labs    CBC No results for input(s): "WBC",  "NEUTROABS", "HGB", "HCT", "MCV", "PLT" in the last 72 hours.  Basic Metabolic Panel Recent Labs    16/10/96 0713 10/03/23 1400 10/04/23 0400  NA 130* 131* 131*  K 2.7* 3.1* 3.3*  CL 93* 94* 93*  CO2 28 25 29   GLUCOSE 106* 126* 122*  BUN 75* 75* 75*  CREATININE 2.85* 2.66* 2.62*  CALCIUM 8.0* 8.1* 8.3*  MG 2.0  --  1.9   Liver Function Tests No results for input(s): "AST", "ALT", "ALKPHOS", "BILITOT", "PROT", "ALBUMIN" in the last 72 hours. No results for input(s): "LIPASE", "AMYLASE" in the last 72 hours. Cardiac Enzymes No results for input(s): "CKTOTAL", "CKMB", "CKMBINDEX", "TROPONINI" in the last 72 hours.  BNP: BNP (last 3 results) Recent Labs    02/09/23 1736 08/01/23 1140 09/22/23 0215  BNP 345.1* 523.3* 572.3*    ProBNP (last 3 results) No results for input(s): "PROBNP" in the last 8760 hours.   D-Dimer No results for input(s): "DDIMER" in the last 72 hours. Hemoglobin A1C No results for input(s): "HGBA1C" in the last 72 hours. Fasting Lipid Panel No results for input(s): "CHOL", "HDL", "LDLCALC", "TRIG", "CHOLHDL", "LDLDIRECT" in the last 72 hours. Thyroid Function Tests No results for input(s): "TSH", "T4TOTAL", "T3FREE", "THYROIDAB" in the last 72 hours.  Invalid input(s): "FREET3"  Other results:   Imaging    No results found.   Medications:     Scheduled Medications:  acetaZOLAMIDE  250 mg Oral BID   apixaban  5 mg Oral BID   atorvastatin  40 mg Oral QPM   clotrimazole  1 Application Topical BID   ezetimibe  10 mg Oral Daily   insulin aspart  0-15 Units Subcutaneous TID WC   insulin aspart  0-5 Units Subcutaneous QHS   insulin glargine  10 Units Subcutaneous QHS   levothyroxine  75 mcg Oral QAC breakfast   metolazone  5 mg Oral BID   mexiletine  250 mg Oral Q12H   polyethylene glycol  17 g Oral Daily   potassium chloride  60 mEq Oral Q4H   senna-docusate  1 tablet Oral BID   sodium chloride flush  10-40 mL Intracatheter Q12H     Infusions:  furosemide (LASIX) 200 mg in dextrose 5 % 100 mL (2 mg/mL) infusion 30 mg/hr (10/04/23 0731)   milrinone 0.25 mcg/kg/min (10/04/23 0730)   promethazine (PHENERGAN) injection (IM or IVPB) Stopped (10/03/23 2229)    PRN Medications: acetaminophen **OR** acetaminophen, diphenhydrAMINE-zinc acetate, Gerhardt's butt cream, hydrOXYzine, mineral oil-hydrophilic petrolatum, mouth rinse, oxyCODONE, prochlorperazine, promethazine (PHENERGAN) injection (IM or IVPB), sodium chloride flush    Patient Profile   [name] with CKD stage III, hypothyroidism, mitral valve endocarditis in 2022, history of CVA, right BKA, severe morbid obesity (BMI 70) and chronic biventricular failure currently admitted with heart failure exacerbation.   Assessment/Plan   1. A/C Biventricular HF EF continues to drop. Previous Echo EF 35-40% EF but now 25-30% + RV failure. Etiology unclear. ? PVC induced? She has not had ischemic work up due to weight/CKD.  Readmitted with marked volume overload. Poor response with intermittent IV lasix and IV bumex. Milrinone added to augment diuresis.  - remains on milrinone 0.25, Co-ox 64%  - CVP 12-13.  - continue lasix gtt 30 + metolazone 5 mg bid, Increase Diamox to 500 bid and supp K  - GDMT limited by hypotension and AKI    2. CKD  Stage IV on AKI  - SCr 3.2 on admit, b/l ~2.0  - SCr trending down, 2.85>>2.62 today  - continue milrinone + diuresis per above - follow BMP    3. Hypothyroidism  - Recent TSH stable.    4. PVCs  Continue mexiletine 250 every 12 hours.    5. H/O PE/DVT - Continue eliquis 5 mg bid.    6.Suspected OHS/OSA - Refuses sleep study   7. Severe Morbid Obesity.  DMII. HGb A1C 7.2 Consider GLP1 as an outpatient.  Body mass index is 69.65 kg/m.   8. Right BKA   9. T2DM - Hgb of 7.2 (improved from 12.2 in 2023) - schedule SSI - per IM   10. Hypokalemia - K 3.3 - Supp K w/ diuresis   Length of Stay: [address] PA-C  10/04/2023, 8:39 AM  Advanced Heart Failure Team Pager [phone] (M-F; 7a - 5p)  Please contact [facility] for night-coverage after hours (5p -7a ) and weekends on [email or web address]  Patient seen with PA, I formulated plan and agree with the above note.   Creatinine stable 2.62.  Co-ox 60% with CVP 17-18 on my read.  I/Os not complete.  Weight lower but weight has been of questionable accuracy (bed weights).  Co-ox 60%.   General: NAD Neck: JVP 16+, no thyromegaly or thyroid nodule.  Lungs: Clear to auscultation bilaterally with normal respiratory effort. CV: Nondisplaced PMI.  Heart regular S1/S2, no S3/S4, no murmur.  Left leg wrapped.  Abdomen: Soft, nontender, no hepatosplenomegaly, no distention.  Skin: Intact without lesions or rashes.  Neurologic: Alert and oriented x 3.  Psych: Normal affect. Extremities: Right BKA.  HEENT: Normal.   Patient is still volume overloaded, good co-ox on milrinone. Creatinine stable at 2.66.  Our ability to diurese her has been limited by CKD stage IV.   - Increase metolazone to 10 bid.  - Increase acetazolamide to 500 bid.  - Continue Lasix 30 mg/hr.  - Continue milrinone 0.25.   EF lower on echo this admission at 25-30%.  No chest pain.  No cath for now with AKI.    Continue apixaban for h/o PE/DVT.    Continue mexiletine for PVCs.   Marca Ancona 10/04/2023 2:53 PM

## 2023-10-04 NOTE — Plan of Care (Signed)

## 2023-10-05 DIAGNOSIS — I5023 Acute on chronic systolic (congestive) heart failure: Secondary | ICD-10-CM | POA: Diagnosis not present

## 2023-10-05 LAB — BASIC METABOLIC PANEL
Anion gap: 12 (ref 5–15)
Anion gap: 11 (ref 5–15)
Anion gap: 9 (ref 5–15)
BUN: 77 mg/dL — ABNORMAL HIGH (ref 6–20)
BUN: 78 mg/dL — ABNORMAL HIGH (ref 6–20)
BUN: 77 mg/dL — ABNORMAL HIGH (ref 6–20)
CO2: 26 mmol/L (ref 22–32)
CO2: 27 mmol/L (ref 22–32)
CO2: 28 mmol/L (ref 22–32)
Calcium: 8.3 mg/dL — ABNORMAL LOW (ref 8.9–10.3)
Calcium: 8.1 mg/dL — ABNORMAL LOW (ref 8.9–10.3)
Calcium: 8.3 mg/dL — ABNORMAL LOW (ref 8.9–10.3)
Chloride: 91 mmol/L — ABNORMAL LOW (ref 98–111)
Chloride: 93 mmol/L — ABNORMAL LOW (ref 98–111)
Chloride: 93 mmol/L — ABNORMAL LOW (ref 98–111)
Creatinine, Ser: 2.71 mg/dL — ABNORMAL HIGH (ref 0.44–1.00)
Creatinine, Ser: 2.64 mg/dL — ABNORMAL HIGH (ref 0.44–1.00)
Creatinine, Ser: 2.65 mg/dL — ABNORMAL HIGH (ref 0.44–1.00)
GFR, Estimated: 21 mL/min — ABNORMAL LOW (ref >60)
GFR, Estimated: 22 mL/min — ABNORMAL LOW (ref >60)
GFR, Estimated: 22 mL/min — ABNORMAL LOW (ref >60)
Glucose, Bld: 154 mg/dL — ABNORMAL HIGH (ref 70–99)
Glucose, Bld: 125 mg/dL — ABNORMAL HIGH (ref 70–99)
Glucose, Bld: 131 mg/dL — ABNORMAL HIGH (ref 70–99)
Potassium: 2.8 mmol/L — ABNORMAL LOW (ref 3.5–5.1)
Potassium: 3 mmol/L — ABNORMAL LOW (ref 3.5–5.1)
Potassium: 3.5 mmol/L (ref 3.5–5.1)
Sodium: 129 mmol/L — ABNORMAL LOW (ref 135–145)
Sodium: 131 mmol/L — ABNORMAL LOW (ref 135–145)
Sodium: 130 mmol/L — ABNORMAL LOW (ref 135–145)

## 2023-10-05 LAB — GLUCOSE, CAPILLARY
Glucose-Capillary: 133 mg/dL — ABNORMAL HIGH (ref 70–99)
Glucose-Capillary: 90 mg/dL (ref 70–99)
Glucose-Capillary: 137 mg/dL — ABNORMAL HIGH (ref 70–99)
Glucose-Capillary: 117 mg/dL — ABNORMAL HIGH (ref 70–99)
Glucose-Capillary: 121 mg/dL — ABNORMAL HIGH (ref 70–99)

## 2023-10-05 LAB — COOXEMETRY PANEL
Carboxyhemoglobin: 2.2 % — ABNORMAL HIGH (ref 0.5–1.5)
Methemoglobin: 0.9 % (ref 0.0–1.5)
O2 Saturation: 81.7 %
Total hemoglobin: 9.8 g/dL — ABNORMAL LOW (ref 12.0–16.0)

## 2023-10-05 LAB — MAGNESIUM: Magnesium: 1.8 mg/dL (ref 1.7–2.4)

## 2023-10-05 MED ORDER — POTASSIUM CHLORIDE CRYS ER 20 MEQ PO TBCR: 40.0000 meq | EXTENDED_RELEASE_TABLET | ORAL | Status: DC

## 2023-10-05 MED ORDER — POTASSIUM CHLORIDE 10 MEQ/50ML IV SOLN
10.0000 meq | INTRAVENOUS | Status: AC
  Administered 2023-10-05 (×4): 10 meq via INTRAVENOUS
  Filled 2023-10-05 (×4): qty 50

## 2023-10-05 MED ORDER — POTASSIUM CHLORIDE CRYS ER 20 MEQ PO TBCR
40.0000 meq | EXTENDED_RELEASE_TABLET | Freq: Once | ORAL | Status: DC
  Filled 2023-10-05: qty 2

## 2023-10-05 MED ORDER — POTASSIUM CHLORIDE 10 MEQ/50ML IV SOLN
10.0000 meq | INTRAVENOUS | Status: AC
Start: 2023-10-05 — End: 2023-10-05
  Administered 2023-10-05 (×4): 10 meq via INTRAVENOUS
  Filled 2023-10-05 (×4): qty 50

## 2023-10-05 MED ORDER — POTASSIUM CHLORIDE 20 MEQ PO PACK
60.0000 meq | PACK | ORAL | Status: DC
  Filled 2023-10-05: qty 3

## 2023-10-05 MED ORDER — POTASSIUM CHLORIDE CRYS ER 10 MEQ PO TBCR
40.0000 meq | EXTENDED_RELEASE_TABLET | ORAL | Status: AC
  Administered 2023-10-05 (×2): 40 meq via ORAL
  Filled 2023-10-05 (×2): qty 4

## 2023-10-05 MED ORDER — POTASSIUM CHLORIDE CRYS ER 10 MEQ PO TBCR
40.0000 meq | EXTENDED_RELEASE_TABLET | ORAL | Status: DC
  Administered 2023-10-05 (×2): 40 meq via ORAL
  Filled 2023-10-05 (×2): qty 4

## 2023-10-05 NOTE — Progress Notes (Addendum)
 This chaplain is present for F/U spiritual care. The Pt. is awake and greets the chaplain.   The chaplain understands the Pt. prefers a revisit after finishing on the bedpan.  **1253 Pt. sleeping at time of chaplain's revisit. Update provided to Pt. RN-Justin.  **[id number] The chaplain greeted the Pt. The Pt. introduced the chaplain to her best friend, care giver-Shel who is visiting at the bedside. The chaplain continued with rapport building through discussing the benefits of the Pt. resting earlier today.  The Pt. gave the chaplain permission to continue today's conversation with Shel present.  The Pt. began the conversation with "why do I need to make decisions now?" The chaplain replied ACP gives a patient the opportunity to document their voice and choices for medical care ahead of time. Therefore guiding the medical team's care when the patient needs it. The chaplain agreed with Shel's statement with "similar to a will." The Pt. questioned, "What happens if you don't have your choices documented?" The chaplain replied if your voice is absent in the decision making sometimes patient care doesn't reflect what is important to a patient.   The chaplain observed the emotion the conversation was creating and offered to leave two blank Advance Directives for Shel and the Pt. to review together. This chaplain will F/U on Monday. The chaplain updated the Pt. RN-Justin and the PMT.  Chaplain Stephanie Acre [phone]

## 2023-10-05 NOTE — Evaluation (Signed)
 Occupational Therapy Evaluation Patient Details Name: Karen Bennett MRN: [id number] DOB: 05-May-1976 Today's Date: 10/05/2023   History of Present Illness   Pt is a 48 y.o. female admitted to [facility] from [facility] on 09/28/23 with LE edema, SOB, and abdominal distention. Chest x-ray showed CHF w/ layering B pleural effusions. Admitted w/ acute on chronic HFrEF, AKI, and hyperkalemia. Prior admit 1/8-1/28 for CHF exacerbation requiring aggressive diuresis and inotropic support. PMH significant for DM2, HTN, HLD, hypothyroidism, CKD IIIa, obesity, Rt BKA, PE on Xarelto, DVT, endocarditis, cellulitis, lumbar radiculopathy, anxiety, depression, CVA.     Clinical Impressions At baseline, pt completes ADLs Independent to Mod I and functional transfers/mobility Mod I with use of power wheelchair. At baseline, pt receives assistance from PCA for meal prep, home management tasks, and other IADLs. Pt now presents with decreased decreased activity tolerance, mildly decreased balance, increased B LE edema affecting functional level, pain in R LE affecting functional level, and decreased safety and independence with functional tasks. Pt currently demonstrates ability to complete ADLs largely Independent to Mod assist and bed mobility with Supervision to Mod assist. Pt VSS on RA throughout session. Pt participated well in session. OT suspects pt will progress quickly toward goals with improved management of B LE edema. Pt will benefit from acute skilled OT services to address deficits outlined below and to increase safety and independence with functional tasks. No post-acute skilled OT needs anticipated at this time.      If plan is discharge home, recommend the following:   Two people to help with walking and/or transfers;A lot of help with bathing/dressing/bathroom;Assistance with cooking/housework;Assist for transportation;Help with stairs or ramp for entrance     Functional Status Assessment   Patient has had a  recent decline in their functional status and demonstrates the ability to make significant improvements in function in a reasonable and predictable amount of time.     Equipment Recommendations   None recommended by OT (Pt already has need equipment)     Recommendations for Other Services         Precautions/Restrictions   Precautions Precautions: Fall;Other (comment) Precaution/Restrictions Comments: h/o R BKA, pt reports is NWB due to spinter femur fx in 2018 Restrictions Weight Bearing Restrictions Per Provider Order: No RLE Weight Bearing Per Provider Order: Non weight bearing     Mobility Bed Mobility   Bed Mobility: Rolling, Supine to Sit Rolling: Min assist, Mod assist   Supine to sit: Supervision, Used rails (to come to long sitting in the bed from bed positioned flat) Sit to supine: Supervision, Used rails (from long sit in bed)   General bed mobility comments: Assist for management of B LE in rolling due to B LE edema and R LE pain. Pt declined sitting EOB this session.    Transfers                   General transfer comment: pt declined sitting EOB and OOB transfers this session      Balance Overall balance assessment: Mild deficits observed, not formally tested (in sitting) Sitting-balance support: No upper extremity supported, Feet supported (in long sitting in bed) Sitting balance-Leahy Scale: Good                                     ADL either performed or assessed with clinical judgement   ADL Overall ADL's : Needs assistance/impaired Eating/Feeding: Independent;Sitting  Grooming: Independent;Sitting   Upper Body Bathing: Set up;Sitting   Lower Body Bathing: Moderate assistance;Sitting/lateral leans (long sitting in bed)   Upper Body Dressing : Modified independent;Sitting   Lower Body Dressing: Moderate assistance;Sitting/lateral leans (long sitting in bed)     Toilet Transfer Details (indicate cue type and  reason): pt declined OOB transfer this day Toileting- Clothing Manipulation and Hygiene: Moderate assistance;Bed level         General ADL Comments: Pt with decreased activity tolerance, B LE edema, and R LE pain affecting functional level. Pt also reports intermittent nausea.     Vision Baseline Vision/History: 0 No visual deficits Ability to See in Adequate Light: 0 Adequate Patient Visual Report: No change from baseline       Perception         Praxis         Pertinent Vitals/Pain Pain Assessment Pain Assessment: Faces Faces Pain Scale: Hurts little more Pain Location: R LE with movement Pain Descriptors / Indicators: Aching, Tightness Pain Intervention(s): Limited activity within patient's tolerance, Monitored during session, Repositioned, Patient requesting pain meds-RN notified     Extremity/Trunk Assessment Upper Extremity Assessment Upper Extremity Assessment: Left hand dominant;Overall WFL for tasks assessed (gross B UE strength 4/5)   Lower Extremity Assessment Lower Extremity Assessment: Defer to PT evaluation   Cervical / Trunk Assessment Cervical / Trunk Assessment: Other exceptions Cervical / Trunk Exceptions: increased body habitus   Communication Communication Communication: No apparent difficulties   Cognition Arousal: Alert Behavior During Therapy: WFL for tasks assessed/performed Cognition: No apparent impairments             OT - Cognition Comments: AAOx4 and pleasant throughout session. Cognition WFL for tasks assessed, not formally screened or tested.                 Following commands: Intact       Cueing  General Comments      VSS on RA throughout session. RN present during a portion of session.   Exercises Exercises: General Upper Extremity General Exercises - Upper Extremity Shoulder Flexion: AROM, Strengthening, Both, 5 reps, Seated (long sitting in bed; increased activity tolerance) Shoulder Extension: AROM,  Strengthening, Both, 5 reps, Seated (long sitting in bed; increased activity tolerance) Shoulder ABduction: AROM, Strengthening, Both, 5 reps, Seated (long sitting in bed; increased activity tolerance) Shoulder ADduction: AROM, Strengthening, Both, 5 reps, Seated (long sitting in bed; increased activity tolerance) Elbow Flexion: AROM, Strengthening, Both, 5 reps, Seated (long sitting in bed; increased activity tolerance) Elbow Extension: AROM, Strengthening, Both, 5 reps, Seated (long sitting in bed; increased activity tolerance)   Shoulder Instructions      Home Living Family/patient expects to be discharged to:: Private residence Living Arrangements: Alone Available Help at Discharge: Personal care attendant (x7 days a week; 8-9 hours per day) Type of Home: Apartment Home Access: Level entry     Home Layout: One level     Bathroom Shower/Tub: Producer, television/film/video: Standard Bathroom Accessibility: Yes How Accessible: Accessible via wheelchair Home Equipment: Wheelchair - power;BSC/3in1;Hand held shower head;Grab bars - tub/shower   Additional Comments: takes SCAT to appointments      Prior Functioning/Environment Prior Level of Function : Needs assist             Mobility Comments: power chair for mobility, uses A/P scoot to transfer independently ADLs Comments: aid helps with cooking, cleaning tasks, and other IADLs; pt otherwise Mod Ind with ADLs; pt enjoys art  including drawing, coloring, beading, and jewelry making    OT Problem List: Decreased activity tolerance;Increased edema   OT Treatment/Interventions: Self-care/ADL training;Therapeutic exercise;Therapeutic activities;Patient/family education;Energy conservation;DME and/or AE instruction      OT Goals(Current goals can be found in the care plan section)   Acute Rehab OT Goals Patient Stated Goal: to have less LE edema/pain and to return home OT Goal Formulation: With patient Time For Goal  Achievement: 10/19/23 Potential to Achieve Goals: Good ADL Goals Pt Will Perform Lower Body Bathing: with contact guard assist;sitting/lateral leans (with adaptive equipment as needed) Pt Will Perform Lower Body Dressing: with contact guard assist;sitting/lateral leans (with adaptive equipment as needed) Pt Will Transfer to Toilet: with min assist;anterior/posterior transfer (with drop-arm, bariatric BSC; with least restrictive AD) Pt/caregiver will Perform Home Exercise Program: Increased strength;Both right and left upper extremity;With theraband;Independently;With written HEP provided (Increased activity tolerance)   OT Frequency:  Min 1X/week    Co-evaluation              AM-PAC OT "6 Clicks" Daily Activity     Outcome Measure Help from another person eating meals?: None Help from another person taking care of personal grooming?: None (in sitting) Help from another person toileting, which includes using toliet, bedpan, or urinal?: A Lot Help from another person bathing (including washing, rinsing, drying)?: A Lot Help from another person to put on and taking off regular upper body clothing?: None Help from another person to put on and taking off regular lower body clothing?: A Lot 6 Click Score: 18   End of Session Nurse Communication: Mobility status;Patient requests pain meds  Activity Tolerance: Patient tolerated treatment well Patient left: in bed;with call bell/phone within reach  OT Visit Diagnosis: Other (comment) (decreased activity tolerance)                Time: 4098-1191 OT Time Calculation (min): 16 min Charges:  OT General Charges $OT Visit: 1 Visit OT Evaluation $OT Eval Low Complexity: 1 Low  [address]" M., OTR/L, MA Acute Rehab [phone]   Lendon Colonel 10/05/2023, 12:00 PM

## 2023-10-05 NOTE — Progress Notes (Signed)
 PROGRESS NOTE    Karen Bennett  ZOX:[id number] DOB: [birth year] DOA: 09/22/2023 PCP: [name], NP  48/F w obesity class 3, chronic systolic CHF, HTN, HLD, IDDM, hypothyroidism, prior DVT/PE, history of Candida mitral valve endocarditis in 2022, pulmonary hypertension, CKD 3B, right BKA, prior CVA, presented to ED with worsening abdominal distention, lower extremity swelling and shortness of breath.  -recently hospitalized and discharged in January 2025 for acute on chronic systolic CHF, required significant diuresis with milrinone.  -In the ED she was found to have volume overload, acute kidney injury, and hyperkalemia.  -placed on IV furosemide, and inotropic support with milrinone.  -3/07 transferred to [facility]   Subjective: Feels fair, no events overnight  Assessment and Plan:  Acute on chronic systolic heart failure (HCC) BiV Failure Pulm HTN -Echo w/ EF 25 to 30%, severe LV cavity dilatation, RV moderately reduced, RVSP 78.2  -CHF team following, on Lasix gtt. 30 Mg per hour and milrinone -on metolazone as well, response to diuretics have been poor, starting to improve a bit -GDMT limited by AKI/CKD -poor SGLT2I candidate and has foley  -Palliative consulted by heart failure team for goals of care, overall poor prognosis with limited support> patient wishes for full code and full scope -Reattempt nightly BiPAP -Adamantly declines SNF  Severe hypokalemia -Replaced  Frequent PVC,  -continue mexiletine, monitor lytes  AKI on CKD4 Hyponatremia, hypokalemia. (Base serum cr 2,0 -creat was 3.2 on admission, now 2.7 on milrinone   History of pulmonary embolism Continue apixaban.   Type 2 diabetes mellitus  -CBGs are stable on current dose of glargine  Hx of right BKA (HCC) PT and OT consulted, she uses a power chair at baseline and transfers to the toilet, declines SNF  Hypothyroidism Continue with levothyroxine   Morbid Obesity OSA/OHS -declined sleep study, needs  significant wt loss -Attempt nighttime BiPAP  History of CVA (cerebrovascular accident) -Continue apixaban and statin  Anxiety and depression Continue with as needed hydroxyzine    DVT prophylaxis: apixaban Code Status: Full Code Family Communication: None present Disposition Plan: Home likely 3 to 4 days  Consultants:    Procedures:   Antimicrobials:    Objective: Vitals:   10/04/23 1950 10/04/23 2345 10/05/23 0519 10/05/23 0727  BP: 123/61 (!) 116/55 129/66 91/79  Pulse:  (!) 103 100 (!) 103  Resp: 19 18 18 16   Temp: 98.5 F (36.9 C) 97.6 F (36.4 C) 99.4 F (37.4 C) 99.1 F (37.3 C)  TempSrc: Oral Oral Oral Oral  SpO2: 97% 91% 96% 91%  Weight:   (!) 197.9 kg   Height:        Intake/Output Summary (Last 24 hours) at 10/05/2023 1328 Last data filed at 10/05/2023 0500 Gross per 24 hour  Intake --  Output 1950 ml  Net -1950 ml   Filed Weights   10/03/23 0502 10/04/23 0400 10/05/23 0519  Weight: (!) 202.4 kg (!) 199.7 kg (!) 197.9 kg    Examination:  Gen: Obese chronically ill female sitting up in bed, AAOx3 HEENT: Neck obese unable to assess JVD CVS: S1-S2, regular rhythm Lungs: Decreased breath sounds at the bases Abdomen: Soft, nontender, bowel sounds present  Extremities: 1-2+ edema, R BKA, UNNA boots Skin: no new rashes on exposed skin   Data Reviewed:   CBC: Recent Labs  Lab 09/30/23 0455  WBC 3.8*  HGB 9.5*  HCT 29.9*  MCV 86.2  PLT 164   Basic Metabolic Panel: Recent Labs  Lab 09/29/23 0535 09/30/23 0455  09/30/23 1702 10/03/23 0713 10/03/23 1400 10/04/23 0400 10/04/23 1749 10/05/23 0410  NA 125* 128*   < > 130* 131* 131* 130* 129*  K 3.2* 3.2*   < > 2.7* 3.1* 3.3* 3.1* 2.8*  CL 92* 91*   < > 93* 94* 93* 90* 91*  CO2 23 23   < > 28 25 29 25 26   GLUCOSE 218* 164*   < > 106* 126* 122* 135* 154*  BUN 73* 73*   < > 75* 75* 75* 76* 77*  CREATININE 2.90* 2.78*   < > 2.85* 2.66* 2.62* 2.60* 2.71*  CALCIUM 7.7* 8.0*   < > 8.0*  8.1* 8.3* 8.2* 8.3*  MG 2.0 2.0  --  2.0  --  1.9  --  1.8   < > = values in this interval not displayed.   GFR: Estimated Creatinine Clearance: 47 mL/min (A) (by C-G formula based on SCr of 2.71 mg/dL (H)). Liver Function Tests: No results for input(s): "AST", "ALT", "ALKPHOS", "BILITOT", "PROT", "ALBUMIN" in the last 168 hours.  No results for input(s): "LIPASE", "AMYLASE" in the last 168 hours. No results for input(s): "AMMONIA" in the last 168 hours. Coagulation Profile: No results for input(s): "INR", "PROTIME" in the last 168 hours. Cardiac Enzymes: No results for input(s): "CKTOTAL", "CKMB", "CKMBINDEX", "TROPONINI" in the last 168 hours. BNP (last 3 results) No results for input(s): "PROBNP" in the last 8760 hours. HbA1C: No results for input(s): "HGBA1C" in the last 72 hours. CBG: Recent Labs  Lab 10/04/23 1651 10/04/23 2118 10/05/23 0559 10/05/23 0602 10/05/23 1148  GLUCAP 137* 129* 133* 90 137*   Lipid Profile: No results for input(s): "CHOL", "HDL", "LDLCALC", "TRIG", "CHOLHDL", "LDLDIRECT" in the last 72 hours. Thyroid Function Tests: No results for input(s): "TSH", "T4TOTAL", "FREET4", "T3FREE", "THYROIDAB" in the last 72 hours. Anemia Panel: No results for input(s): "VITAMINB12", "FOLATE", "FERRITIN", "TIBC", "IRON", "RETICCTPCT" in the last 72 hours. Urine analysis:    Component Value Date/Time   COLORURINE AMBER (A) 03/22/2023 0426   APPEARANCEUR TURBID (A) 03/22/2023 0426   LABSPEC 1.040 (H) 03/22/2023 0426   PHURINE 5.0 03/22/2023 0426   GLUCOSEU >=500 (A) 03/22/2023 0426   HGBUR LARGE (A) 03/22/2023 0426   BILIRUBINUR NEGATIVE 03/22/2023 0426   KETONESUR NEGATIVE 03/22/2023 0426   PROTEINUR >=300 (A) 03/22/2023 0426   UROBILINOGEN 0.2 09/05/2011 0558   NITRITE POSITIVE (A) 03/22/2023 0426   LEUKOCYTESUR MODERATE (A) 03/22/2023 0426   Sepsis Labs: @LABRCNTIP (procalcitonin:4,lacticidven:4)  ) Recent Results (from the past 240 hours)  MRSA Next  Gen by PCR, Nasal     Status: None   Collection Time: 09/27/23 10:54 PM   Specimen: Nasal Mucosa; Nasal Swab  Result Value Ref Range Status   MRSA by PCR Next Gen NOT DETECTED NOT DETECTED Final    Comment: (NOTE) The GeneXpert MRSA Assay (FDA approved for NASAL specimens only), is one component of a comprehensive MRSA colonization surveillance program. It is not intended to diagnose MRSA infection nor to guide or monitor treatment for MRSA infections. Test performance is not FDA approved in patients less than 74 years old. Performed at [facility], [address], [place], [place] [address]      Radiology Studies: No results found.   Scheduled Meds:  acetaZOLAMIDE  500 mg Oral BID   apixaban  5 mg Oral BID   atorvastatin  40 mg Oral QPM   clotrimazole  1 Application Topical BID   ezetimibe  10 mg Oral Daily  insulin aspart  0-15 Units Subcutaneous TID WC   insulin aspart  0-5 Units Subcutaneous QHS   insulin glargine  10 Units Subcutaneous QHS   levothyroxine  75 mcg Oral QAC breakfast   metolazone  10 mg Oral BID   mexiletine  250 mg Oral Q12H   polyethylene glycol  17 g Oral Daily   potassium chloride  40 mEq Oral Q4H   senna-docusate  1 tablet Oral BID   sodium chloride flush  10-40 mL Intracatheter Q12H   Continuous Infusions:  furosemide (LASIX) 200 mg in dextrose 5 % 100 mL (2 mg/mL) infusion 30 mg/hr (10/05/23 1105)   milrinone 0.25 mcg/kg/min (10/05/23 1105)   potassium chloride 10 mEq (10/05/23 1237)   promethazine (PHENERGAN) injection (IM or IVPB) 6.25 mg (10/05/23 0512)     LOS: 13 days    Time spent:    Zannie Cove, MD [facility]   10/05/2023, 1:28 PM

## 2023-10-05 NOTE — TOC Progression Note (Signed)
 Transition of Care [facility]) - Progression Note    Patient Details  Name: Karen Bennett MRN: [id number] Date of Birth: [birth year]  [facility]) CM/SW Contact  Elliot Cousin, RN Phone Number: [phone] 10/05/2023, 2:16 PM  Clinical Narrative:     TOC CM spoke to pt at bedside. Gave permission to speak to emergency contacts sister and friend. Pt lives in apt alone. Has an aide that comes 7 days per week for 6-8 hours. She declines SNF for rehab. Offered choice for [facility] and Outpt Palliative. Pt agreeable to [facility] and [facility]. (Medicare.gov list with ratings placed in chart and provided to pt). Contacted [facility] rep, Clifton Custard with new referral. Contacted [facility] rep, Shawn with referral for Outpt Palliative. Pt will travel home by PTAR.   No DME needed.   Expected Discharge Plan: Home w Home Health Services Barriers to Discharge: Continued Medical Work up  Expected Discharge Plan and Services   Discharge Planning Services: CM Consult Post Acute Care Choice: Home Health Living arrangements for the past 2 months: Apartment                           HH Arranged: RN           Social Determinants of Health (SDOH) Interventions SDOH Screenings   Food Insecurity: No Food Insecurity (09/22/2023)  Housing: Low Risk  (09/22/2023)  Transportation Needs: No Transportation Needs (09/22/2023)  Utilities: Not At Risk (09/22/2023)  Financial Resource Strain: Low Risk  (09/07/2023)   Received from [facility]  Physical Activity: Sufficiently Active (05/07/2023)   Received from [facility]  Social Connections: Moderately Integrated (05/07/2023)   Received from [facility]  Stress: Stress Concern Present (05/07/2023)   Received from [facility]  Tobacco Use: High Risk (09/22/2023)    Readmission Risk Interventions    09/24/2023   11:10 AM 08/02/2023    1:28 PM 02/13/2023   11:23 AM  Readmission Risk Prevention Plan  Transportation Screening Complete  Complete Complete  PCP or Specialist Appt within 3-5 Days   Complete  HRI or Home Care Consult   Complete  Social Work Consult for Recovery Care Planning/Counseling   Complete  Palliative Care Screening   Complete  Medication Review Oceanographer) Complete Complete Complete  PCP or Specialist appointment within 3-5 days of discharge  Complete   HRI or Home Care Consult Complete Complete   SW Recovery Care/Counseling Consult Complete Complete   Palliative Care Screening Not Applicable Not Applicable   Skilled Nursing Facility Not Applicable Not Applicable

## 2023-10-05 NOTE — Progress Notes (Addendum)
 Advanced Heart Failure Rounding Note  Cardiologist: Chilton Si, MD  Chief Complaint: acute on chronic systolic heart failure Subjective:    Remains on milrinone 0.25, co-ox 82%  Improved UOP yesterday, 2.4L. Intake not recorded. C/w pale yellow urine in foley. CVP 10. Wt down 4 lb.   SCr 2.62>>2.71. BUN 77 (stable)  K low at 2.8, pt unable to tolerate PO K supp yesterday due to nausea/inability to swallow pills.   Na 129  C/w nausea. No resting dyspnea.     Objective:   Weight Range: (!) 197.9 kg Body mass index is 70.4 kg/m.   Vital Signs:   Temp:  [97.6 F (36.4 C)-99.4 F (37.4 C)] 99.1 F (37.3 C) (03/14 0727) Pulse Rate:  [97-103] 103 (03/14 0727) Resp:  [16-19] 16 (03/14 0727) BP: (91-129)/(48-101) 91/79 (03/14 0727) SpO2:  [91 %-97 %] 91 % (03/14 0727) Weight:  [197.9 kg] 197.9 kg (03/14 0519) Last BM Date : 10/04/23  Weight change: Filed Weights   10/03/23 0502 10/04/23 0400 10/05/23 0519  Weight: (!) 202.4 kg (!) 199.7 kg (!) 197.9 kg    Intake/Output:   Intake/Output Summary (Last 24 hours) at 10/05/2023 0818 Last data filed at 10/05/2023 0500 Gross per 24 hour  Intake --  Output 2400 ml  Net -2400 ml     Physical Exam   CVP 10 General:  supper morbid obesity, fatigued appearing. No respiratory difficulty HEENT: normal Neck: supple. JVD 10 cm Carotids 2+ bilat; no bruits. No lymphadenopathy or thyromegaly appreciated. Cor: PMI nondisplaced. Regular rate & rhythm. No rubs, gallops or murmurs. Lungs: clear Abdomen: soft, nontender, nondistended. No hepatosplenomegaly. No bruits or masses. Good bowel sounds. Extremities: no cyanosis, clubbing, rash, s/p RBKA, LLE edematous w/ unna boot + LUE PICC Neuro: alert & oriented x 3, cranial nerves grossly intact. moves all 4 extremities w/o difficulty. Affect pleasant.   Telemetry   NSR 90s, personally reviewed   EKG    No new EKG to review  Labs    CBC No results for input(s):  "WBC", "NEUTROABS", "HGB", "HCT", "MCV", "PLT" in the last 72 hours.  Basic Metabolic Panel Recent Labs    16/10/96 0400 10/04/23 1749 10/05/23 0410  NA 131* 130* 129*  K 3.3* 3.1* 2.8*  CL 93* 90* 91*  CO2 29 25 26   GLUCOSE 122* 135* 154*  BUN 75* 76* 77*  CREATININE 2.62* 2.60* 2.71*  CALCIUM 8.3* 8.2* 8.3*  MG 1.9  --  1.8   Liver Function Tests No results for input(s): "AST", "ALT", "ALKPHOS", "BILITOT", "PROT", "ALBUMIN" in the last 72 hours. No results for input(s): "LIPASE", "AMYLASE" in the last 72 hours. Cardiac Enzymes No results for input(s): "CKTOTAL", "CKMB", "CKMBINDEX", "TROPONINI" in the last 72 hours.  BNP: BNP (last 3 results) Recent Labs    02/09/23 1736 08/01/23 1140 09/22/23 0215  BNP 345.1* 523.3* 572.3*    ProBNP (last 3 results) No results for input(s): "PROBNP" in the last 8760 hours.   D-Dimer No results for input(s): "DDIMER" in the last 72 hours. Hemoglobin A1C No results for input(s): "HGBA1C" in the last 72 hours. Fasting Lipid Panel No results for input(s): "CHOL", "HDL", "LDLCALC", "TRIG", "CHOLHDL", "LDLDIRECT" in the last 72 hours. Thyroid Function Tests No results for input(s): "TSH", "T4TOTAL", "T3FREE", "THYROIDAB" in the last 72 hours.  Invalid input(s): "FREET3"  Other results:   Imaging    No results found.   Medications:     Scheduled Medications:  acetaZOLAMIDE  500 mg  Oral BID   apixaban  5 mg Oral BID   atorvastatin  40 mg Oral QPM   clotrimazole  1 Application Topical BID   ezetimibe  10 mg Oral Daily   insulin aspart  0-15 Units Subcutaneous TID WC   insulin aspart  0-5 Units Subcutaneous QHS   insulin glargine  10 Units Subcutaneous QHS   levothyroxine  75 mcg Oral QAC breakfast   metolazone  10 mg Oral BID   mexiletine  250 mg Oral Q12H   polyethylene glycol  17 g Oral Daily   potassium chloride  60 mEq Oral Q4H   potassium chloride  40 mEq Oral Once   senna-docusate  1 tablet Oral BID    sodium chloride flush  10-40 mL Intracatheter Q12H    Infusions:  furosemide (LASIX) 200 mg in dextrose 5 % 100 mL (2 mg/mL) infusion 30 mg/hr (10/05/23 0414)   milrinone 0.25 mcg/kg/min (10/05/23 0415)   potassium chloride     promethazine (PHENERGAN) injection (IM or IVPB) 6.25 mg (10/05/23 0512)    PRN Medications: acetaminophen **OR** acetaminophen, diphenhydrAMINE-zinc acetate, Gerhardt's butt cream, hydrOXYzine, mineral oil-hydrophilic petrolatum, mouth rinse, oxyCODONE, prochlorperazine, promethazine (PHENERGAN) injection (IM or IVPB), sodium chloride flush    Patient Profile   [name] with CKD stage III, hypothyroidism, mitral valve endocarditis in 2022, history of CVA, right BKA, severe morbid obesity (BMI 70) and chronic biventricular failure currently admitted with heart failure exacerbation.   Assessment/Plan   1. A/C Biventricular HF EF continues to drop. Previous Echo EF 35-40% EF but now 25-30% + RV failure. Etiology unclear. ? PVC induced? She has not had ischemic work up due to weight/CKD.  Readmitted with marked volume overload. Poor response with intermittent IV lasix and IV bumex. Milrinone added to augment diuresis.  - remains on milrinone 0.25, Co-ox 82%  - UOP picking up, volume status improving slowly and remains volume up  - continue lasix gtt @30  mg w/ aggressive K supp w/ IV KCl. Once K repleated, will redose metolazone 10 bid + Diamox 500 mg bid  - obtain PM BMP to reassess K and SCr  - GDMT limited by hypotension and AKI    2. CKD  Stage IV on AKI  - SCr 3.2 on admit, b/l ~2.0  - SCr trending down, 2.7 today  - continue milrinone + diuresis per above - follow BMP    3. Hypothyroidism  - Recent TSH stable.    4. PVCs  Continue mexiletine 250 every 12 hours.    5. H/O PE/DVT - Continue eliquis 5 mg bid.    6.Suspected OHS/OSA - Refuses sleep study   7. Severe Morbid Obesity.  DMII. HGb A1C 7.2 Consider GLP1 as an outpatient.  Body mass  index is 69.65 kg/m.   8. Right BKA   9. T2DM - Hgb of 7.2 (improved from 12.2 in 2023) - schedule SSI - per IM   10. Hypokalemia - K 2.8 - Aggressive Supp w/ IV KCl - obtain f/u BMP in PM   Length of Stay: [address], PA-C  10/05/2023, 8:18 AM  Advanced Heart Failure Team Pager [phone] (M-F; 7a - 5p)  Please contact [facility] for night-coverage after hours (5p -7a ) and weekends on [email or web address]  Patient seen with PA, I formulated the plan and agree with the above note.   Better diuresis yesterday, weight down 4 lbs. CVP 14 today. Creatinine 2.6 => 2.71 with stable BUN.  K is low,  struggling with po K.   Co-ox 81% on milrinone 0.25.   General: NAD Neck: JVP 14 cm, no thyromegaly or thyroid nodule.  Lungs: Clear to auscultation bilaterally with normal respiratory effort. CV: Nondisplaced PMI.  Heart regular S1/S2, no S3/S4, no murmur.  1+ edema to knee on left.   Abdomen: Soft, nontender, no hepatosplenomegaly, no distention.  Skin: Intact without lesions or rashes.  Neurologic: Alert and oriented x 3.  Psych: Normal affect. Extremities: R BKA.  HEENT: Normal.   Patient is still volume overloaded, good co-ox on milrinone. Creatinine stable at 2.66 => 2.71.  Our ability to diurese her has been limited by CKD stage IV.  She did diurese better yesterday and weight down 4 lbs.  - Continue metolazone 10 bid.  - Continue acetazolamide to 500 bid.  - Continue Lasix 30 mg/hr.  - Continue milrinone 0.25.  - Replace K aggressively, use smallest pills or powder.  Will also give IV.  Repeat BMET in pm.    EF lower on echo this admission at 25-30%.  No chest pain.  No cath for now with AKI.    Continue apixaban for h/o PE/DVT.    Continue mexiletine for PVCs.   Marca Ancona 10/05/2023 12:51 PM

## 2023-10-06 DIAGNOSIS — I5023 Acute on chronic systolic (congestive) heart failure: Secondary | ICD-10-CM | POA: Diagnosis not present

## 2023-10-06 LAB — BASIC METABOLIC PANEL
Anion gap: 11 (ref 5–15)
Anion gap: 10 (ref 5–15)
BUN: 73 mg/dL — ABNORMAL HIGH (ref 6–20)
BUN: 72 mg/dL — ABNORMAL HIGH (ref 6–20)
CO2: 24 mmol/L (ref 22–32)
CO2: 26 mmol/L (ref 22–32)
Calcium: 7.9 mg/dL — ABNORMAL LOW (ref 8.9–10.3)
Calcium: 7.9 mg/dL — ABNORMAL LOW (ref 8.9–10.3)
Chloride: 94 mmol/L — ABNORMAL LOW (ref 98–111)
Chloride: 95 mmol/L — ABNORMAL LOW (ref 98–111)
Creatinine, Ser: 2.43 mg/dL — ABNORMAL HIGH (ref 0.44–1.00)
Creatinine, Ser: 2.29 mg/dL — ABNORMAL HIGH (ref 0.44–1.00)
GFR, Estimated: 24 mL/min — ABNORMAL LOW (ref >60)
GFR, Estimated: 26 mL/min — ABNORMAL LOW (ref >60)
Glucose, Bld: 191 mg/dL — ABNORMAL HIGH (ref 70–99)
Glucose, Bld: 104 mg/dL — ABNORMAL HIGH (ref 70–99)
Potassium: 3.4 mmol/L — ABNORMAL LOW (ref 3.5–5.1)
Potassium: 3.3 mmol/L — ABNORMAL LOW (ref 3.5–5.1)
Sodium: 129 mmol/L — ABNORMAL LOW (ref 135–145)
Sodium: 131 mmol/L — ABNORMAL LOW (ref 135–145)

## 2023-10-06 LAB — GLUCOSE, CAPILLARY
Glucose-Capillary: 103 mg/dL — ABNORMAL HIGH (ref 70–99)
Glucose-Capillary: 100 mg/dL — ABNORMAL HIGH (ref 70–99)
Glucose-Capillary: 108 mg/dL — ABNORMAL HIGH (ref 70–99)
Glucose-Capillary: 112 mg/dL — ABNORMAL HIGH (ref 70–99)

## 2023-10-06 LAB — COOXEMETRY PANEL
Carboxyhemoglobin: 1.1 % (ref 0.5–1.5)
Methemoglobin: 0.8 % (ref 0.0–1.5)
O2 Saturation: 64.9 %
Total hemoglobin: 9.8 g/dL — ABNORMAL LOW (ref 12.0–16.0)

## 2023-10-06 LAB — MAGNESIUM: Magnesium: 1.8 mg/dL (ref 1.7–2.4)

## 2023-10-06 MED ORDER — POTASSIUM CHLORIDE CRYS ER 20 MEQ PO TBCR
60.0000 meq | EXTENDED_RELEASE_TABLET | ORAL | Status: AC
  Administered 2023-10-06 (×3): 60 meq via ORAL
  Filled 2023-10-06 (×3): qty 3

## 2023-10-06 MED ORDER — POTASSIUM CHLORIDE CRYS ER 10 MEQ PO TBCR
40.0000 meq | EXTENDED_RELEASE_TABLET | Freq: Once | ORAL | Status: AC
  Administered 2023-10-06: 40 meq via ORAL
  Filled 2023-10-06: qty 4

## 2023-10-06 MED ORDER — MAGNESIUM SULFATE 2 GM/50ML IV SOLN
2.0000 g | Freq: Once | INTRAVENOUS | Status: AC
  Administered 2023-10-06: 2 g via INTRAVENOUS
  Filled 2023-10-06: qty 50

## 2023-10-06 NOTE — Progress Notes (Addendum)
 PROGRESS NOTE    Karen Bennett  UJW:[id number] DOB: [birth year] DOA: 09/22/2023 PCP: [name], NP  48/F w obesity class 3, chronic systolic CHF, HTN, HLD, IDDM, hypothyroidism, prior DVT/PE, history of [place] mitral valve endocarditis in 2022, pulmonary hypertension, CKD 3B, right BKA, prior CVA, presented to ED with worsening abdominal distention, lower extremity swelling and shortness of breath.  -recently hospitalized and discharged in January 2025 for acute on chronic systolic CHF, required significant diuresis with milrinone.  -In the ED she was found to have volume overload, acute kidney injury, and hyperkalemia.  -placed on IV furosemide, and inotropic support with milrinone.  -3/07 transferred to [facility]  -3/9-14: Continued on high-dose diuretics -3/13: Palliative care meeting, wishes for full code, full scope  Subjective: -No events overnight, denies complaints, off O2 this morning  Assessment and Plan:  Acute on chronic systolic heart failure (HCC) BiV Failure Pulm HTN -Echo w/ EF 25 to 30%, severe LV cavity dilatation, RV moderately reduced, RVSP 78.2  -CHF team following, on Lasix gtt. 30 Mg per hour and milrinone  -response to diuretics improving some -GDMT limited by AKI/CKD -poor SGLT2I candidate and has foley  -Palliative consulted for goals of care, overall poor prognosis with limited support> patient wishes for full code and full scope -[facility] declines SNF  Hypokalemia, hypomagnesemia -Replace  Frequent PVC,  -continue mexiletine, monitor lytes  AKI on CKD4 Hyponatremia, hypokalemia. (Base serum cr 2,0 -creat was 3.2 on admission, now improving on milrinone  History of pulmonary embolism Continue apixaban.   Type 2 diabetes mellitus  -CBGs are stable on current dose of glargine  Hx of right BKA (HCC) PT and OT consulted, she uses a power chair at baseline and transfers to the toilet, declines SNF  Hypothyroidism Continue with levothyroxine    Morbid Obesity OSA/OHS -declined sleep study, needs significant wt loss -Attempt nighttime BiPAP  History of CVA (cerebrovascular accident) -Continue apixaban and statin  Anxiety and depression Continue with as needed hydroxyzine    DVT prophylaxis: apixaban Code Status: Full Code Family Communication: None present Disposition Plan: Home likely 3 to 4 days  Consultants:    Procedures:   Antimicrobials:    Objective: Vitals:   10/06/23 0045 10/06/23 0254 10/06/23 0500 10/06/23 0806  BP:  117/63  115/62  Pulse:  97    Resp:  18  15  Temp:  98 F (36.7 C)  97.6 F (36.4 C)  TempSrc:  Oral  Oral  SpO2: 97% 90%    Weight:   (!) 200.3 kg   Height:        Intake/Output Summary (Last 24 hours) at 10/06/2023 1126 Last data filed at 10/06/2023 5621 Gross per 24 hour  Intake 1697.55 ml  Output 2400 ml  Net -702.45 ml   Filed Weights   10/04/23 0400 10/05/23 0519 10/06/23 0500  Weight: (!) 199.7 kg (!) 197.9 kg (!) 200.3 kg    Examination:  Gen: Obese chronically ill female sitting up in bed, AAOx3 HEENT: Neck obese unable to assess JVD CVS: S1-S2, regular rhythm Lungs: Distant breath sounds Abdomen: Soft, obese, nontender, bowel sounds present Extremities: 1+ edema in upper thighs, Unna boots noted, right BKA  Skin: no new rashes on exposed skin   Data Reviewed:   CBC: Recent Labs  Lab 09/30/23 0455  WBC 3.8*  HGB 9.5*  HCT 29.9*  MCV 86.2  PLT 164   Basic Metabolic Panel: Recent Labs  Lab 09/30/23 0455 09/30/23 1702 10/03/23 0713 10/03/23 1400  10/04/23 0400 10/04/23 1749 10/05/23 0410 10/05/23 1440 10/05/23 2235 10/06/23 0355  NA 128*   < > 130*   < > 131* 130* 129* 131* 130* 129*  K 3.2*   < > 2.7*   < > 3.3* 3.1* 2.8* 3.0* 3.5 3.4*  CL 91*   < > 93*   < > 93* 90* 91* 93* 93* 94*  CO2 23   < > 28   < > 29 25 26 27 28 24   GLUCOSE 164*   < > 106*   < > 122* 135* 154* 125* 131* 191*  BUN 73*   < > 75*   < > 75* 76* 77* 78* 77* 73*   CREATININE 2.78*   < > 2.85*   < > 2.62* 2.60* 2.71* 2.64* 2.65* 2.43*  CALCIUM 8.0*   < > 8.0*   < > 8.3* 8.2* 8.3* 8.1* 8.3* 7.9*  MG 2.0  --  2.0  --  1.9  --  1.8  --   --  1.8   < > = values in this interval not displayed.   GFR: Estimated Creatinine Clearance: 52.8 mL/min (A) (by C-G formula based on SCr of 2.43 mg/dL (H)). Liver Function Tests: No results for input(s): "AST", "ALT", "ALKPHOS", "BILITOT", "PROT", "ALBUMIN" in the last 168 hours.  No results for input(s): "LIPASE", "AMYLASE" in the last 168 hours. No results for input(s): "AMMONIA" in the last 168 hours. Coagulation Profile: No results for input(s): "INR", "PROTIME" in the last 168 hours. Cardiac Enzymes: No results for input(s): "CKTOTAL", "CKMB", "CKMBINDEX", "TROPONINI" in the last 168 hours. BNP (last 3 results) No results for input(s): "PROBNP" in the last 8760 hours. HbA1C: No results for input(s): "HGBA1C" in the last 72 hours. CBG: Recent Labs  Lab 10/05/23 0602 10/05/23 1148 10/05/23 1604 10/05/23 2115 10/06/23 0626  GLUCAP 90 137* 117* 121* 103*   Lipid Profile: No results for input(s): "CHOL", "HDL", "LDLCALC", "TRIG", "CHOLHDL", "LDLDIRECT" in the last 72 hours. Thyroid Function Tests: No results for input(s): "TSH", "T4TOTAL", "FREET4", "T3FREE", "THYROIDAB" in the last 72 hours. Anemia Panel: No results for input(s): "VITAMINB12", "FOLATE", "FERRITIN", "TIBC", "IRON", "RETICCTPCT" in the last 72 hours. Urine analysis:    Component Value Date/Time   COLORURINE AMBER (A) 03/22/2023 0426   APPEARANCEUR TURBID (A) 03/22/2023 0426   LABSPEC 1.040 (H) 03/22/2023 0426   PHURINE 5.0 03/22/2023 0426   GLUCOSEU >=500 (A) 03/22/2023 0426   HGBUR LARGE (A) 03/22/2023 0426   BILIRUBINUR NEGATIVE 03/22/2023 0426   KETONESUR NEGATIVE 03/22/2023 0426   PROTEINUR >=300 (A) 03/22/2023 0426   UROBILINOGEN 0.2 09/05/2011 0558   NITRITE POSITIVE (A) 03/22/2023 0426   LEUKOCYTESUR MODERATE (A)  03/22/2023 0426   Sepsis Labs: @LABRCNTIP (procalcitonin:4,lacticidven:4)  ) Recent Results (from the past 240 hours)  MRSA Next Gen by PCR, Nasal     Status: None   Collection Time: 09/27/23 10:54 PM   Specimen: Nasal Mucosa; Nasal Swab  Result Value Ref Range Status   MRSA by PCR Next Gen NOT DETECTED NOT DETECTED Final    Comment: (NOTE) The GeneXpert MRSA Assay (FDA approved for NASAL specimens only), is one component of a comprehensive MRSA colonization surveillance program. It is not intended to diagnose MRSA infection nor to guide or monitor treatment for MRSA infections. Test performance is not FDA approved in patients less than 43 years old. Performed at [facility], [address], [place], [place] [address]      Radiology Studies:  No results found.   Scheduled Meds:  acetaZOLAMIDE  500 mg Oral BID   apixaban  5 mg Oral BID   atorvastatin  40 mg Oral QPM   clotrimazole  1 Application Topical BID   ezetimibe  10 mg Oral Daily   insulin aspart  0-15 Units Subcutaneous TID WC   insulin aspart  0-5 Units Subcutaneous QHS   insulin glargine  10 Units Subcutaneous QHS   levothyroxine  75 mcg Oral QAC breakfast   metolazone  10 mg Oral BID   mexiletine  250 mg Oral Q12H   polyethylene glycol  17 g Oral Daily   potassium chloride  60 mEq Oral Q4H   senna-docusate  1 tablet Oral BID   sodium chloride flush  10-40 mL Intracatheter Q12H   Continuous Infusions:  furosemide (LASIX) 200 mg in dextrose 5 % 100 mL (2 mg/mL) infusion 30 mg/hr (10/06/23 0747)   milrinone 0.25 mcg/kg/min (10/06/23 0749)   promethazine (PHENERGAN) injection (IM or IVPB) 6.25 mg (10/06/23 0935)     LOS: 14 days    Time spent:    Zannie Cove, MD [facility]   10/06/2023, 11:26 AM

## 2023-10-06 NOTE — Progress Notes (Signed)
 Patient ID: Karen Bennett, female   DOB: 1975/11/16, 48 y.o.   MRN: [id number]     Advanced Heart Failure Rounding Note  Cardiologist: Chilton Si, MD  Chief Complaint: acute on chronic systolic heart failure Subjective:    Remains on milrinone 0.25, co-ox 65%  Not sure all UOP measured yesterday.  Creatinine trending down, 2.65 => 2.43. CVP still 17-18.  She is on Lasix gtt 30 mg/hr, metolazone 10 bid, acetazolamide 500 bid.   Breathing ok at rest.    Objective:   Weight Range: (!) 200.3 kg Body mass index is 71.27 kg/m.   Vital Signs:   Temp:  [97.6 F (36.4 C)-98.5 F (36.9 C)] 97.6 F (36.4 C) (03/15 0806) Pulse Rate:  [94-100] 97 (03/15 0254) Resp:  [15-18] 15 (03/15 0806) BP: (99-117)/(55-63) 115/62 (03/15 0806) SpO2:  [89 %-97 %] 90 % (03/15 0254) Weight:  [200.3 kg] 200.3 kg (03/15 0500) Last BM Date : 10/06/23  Weight change: Filed Weights   10/04/23 0400 10/05/23 0519 10/06/23 0500  Weight: (!) 199.7 kg (!) 197.9 kg (!) 200.3 kg    Intake/Output:   Intake/Output Summary (Last 24 hours) at 10/06/2023 1152 Last data filed at 10/06/2023 0807 Gross per 24 hour  Intake 1697.55 ml  Output 2400 ml  Net -702.45 ml     Physical Exam   CVP 17-18 General: NAD Neck: JVP 12-14 cm, no thyromegaly or thyroid nodule.  Lungs: Clear to auscultation bilaterally with normal respiratory effort. CV: Nondisplaced PMI.  Heart regular S1/S2, no S3/S4, no murmur.  1+ edema to knee on left.  Abdomen: Soft, nontender, no hepatosplenomegaly, no distention.  Skin: Intact without lesions or rashes.  Neurologic: Alert and oriented x 3.  Psych: Normal affect. Extremities: No clubbing or cyanosis. Right BKA.  HEENT: Normal.    Telemetry   NSR 90s, personally reviewed   EKG    No new EKG to review  Labs    CBC No results for input(s): "WBC", "NEUTROABS", "HGB", "HCT", "MCV", "PLT" in the last 72 hours.  Basic Metabolic Panel Recent Labs    40/98/11 0410  10/05/23 1440 10/05/23 2235 10/06/23 0355  NA 129*   < > 130* 129*  K 2.8*   < > 3.5 3.4*  CL 91*   < > 93* 94*  CO2 26   < > 28 24  GLUCOSE 154*   < > 131* 191*  BUN 77*   < > 77* 73*  CREATININE 2.71*   < > 2.65* 2.43*  CALCIUM 8.3*   < > 8.3* 7.9*  MG 1.8  --   --  1.8   < > = values in this interval not displayed.   Liver Function Tests No results for input(s): "AST", "ALT", "ALKPHOS", "BILITOT", "PROT", "ALBUMIN" in the last 72 hours. No results for input(s): "LIPASE", "AMYLASE" in the last 72 hours. Cardiac Enzymes No results for input(s): "CKTOTAL", "CKMB", "CKMBINDEX", "TROPONINI" in the last 72 hours.  BNP: BNP (last 3 results) Recent Labs    02/09/23 1736 08/01/23 1140 09/22/23 0215  BNP 345.1* 523.3* 572.3*    ProBNP (last 3 results) No results for input(s): "PROBNP" in the last 8760 hours.   D-Dimer No results for input(s): "DDIMER" in the last 72 hours. Hemoglobin A1C No results for input(s): "HGBA1C" in the last 72 hours. Fasting Lipid Panel No results for input(s): "CHOL", "HDL", "LDLCALC", "TRIG", "CHOLHDL", "LDLDIRECT" in the last 72 hours. Thyroid Function Tests No results for input(s): "TSH", "T4TOTAL", "T3FREE", "  THYROIDAB" in the last 72 hours.  Invalid input(s): "FREET3"  Other results:   Imaging    No results found.   Medications:     Scheduled Medications:  acetaZOLAMIDE  500 mg Oral BID   apixaban  5 mg Oral BID   atorvastatin  40 mg Oral QPM   clotrimazole  1 Application Topical BID   ezetimibe  10 mg Oral Daily   insulin aspart  0-15 Units Subcutaneous TID WC   insulin aspart  0-5 Units Subcutaneous QHS   insulin glargine  10 Units Subcutaneous QHS   levothyroxine  75 mcg Oral QAC breakfast   metolazone  10 mg Oral BID   mexiletine  250 mg Oral Q12H   polyethylene glycol  17 g Oral Daily   potassium chloride  60 mEq Oral Q4H   senna-docusate  1 tablet Oral BID   sodium chloride flush  10-40 mL Intracatheter Q12H     Infusions:  furosemide (LASIX) 200 mg in dextrose 5 % 100 mL (2 mg/mL) infusion 30 mg/hr (10/06/23 0747)   magnesium sulfate bolus IVPB     milrinone 0.25 mcg/kg/min (10/06/23 0749)   promethazine (PHENERGAN) injection (IM or IVPB) 6.25 mg (10/06/23 0935)    PRN Medications: acetaminophen **OR** acetaminophen, diphenhydrAMINE-zinc acetate, Gerhardt's butt cream, hydrOXYzine, mineral oil-hydrophilic petrolatum, mouth rinse, oxyCODONE, prochlorperazine, promethazine (PHENERGAN) injection (IM or IVPB), sodium chloride flush    Patient Profile   [name] with CKD stage III, hypothyroidism, mitral valve endocarditis in 2022, history of CVA, right BKA, severe morbid obesity (BMI 70) and chronic biventricular failure currently admitted with heart failure exacerbation.   Assessment/Plan   1. A/C Biventricular HF EF continues to drop. Previous Echo EF 35-40% EF but now 25-30% + RV failure. Etiology unclear. ? PVC induced? She has not had ischemic work up due to weight/CKD.  Readmitted with marked volume overload. Poor response with intermittent IV lasix and IV bumex. Milrinone added to augment diuresis.  - remains on milrinone 0.25, Co-ox 65%  - CVP still high at 17-18.  Weight difficult to follow as she cannot stand to weigh.  Continue lasix gtt @30  mg w/ aggressive K supplementation. Continue metolazone 10 bid + Diamox 500 mg bid  - obtain PM BMET to reassess K and SCr  - GDMT limited by hypotension and AKI  - Will continue aggressive diuresis as long as creatinine tolerates until Monday then reassess.    2. CKD  Stage IV on AKI  - SCr 3.2 on admit, b/l ~2.0  - SCr trending down, 2.7 => 2.43 today  - continue milrinone + diuresis per above - follow BMET    3. Hypothyroidism  - Recent TSH stable.    4. PVCs  Continue mexiletine 250 every 12 hours.    5. H/O PE/DVT - Continue eliquis 5 mg bid.    6.Suspected OHS/OSA - Refuses sleep study   7. Severe Morbid Obesity.  DMII.  HGb A1C 7.2 Consider GLP1 as an outpatient.  Body mass index is 69.65 kg/m.   8. Right BKA   9. Hypokalemia - K 3.4 - Replace K.  - obtain f/u BMET in PM   Length of Stay: 14  Marca Ancona, MD  10/06/2023, 11:52 AM  Advanced Heart Failure Team Pager [phone] (M-F; 7a - 5p)  Please contact [facility] for night-coverage after hours (5p -7a ) and weekends on [email or web address]

## 2023-10-06 NOTE — Plan of Care (Signed)
 Chart reviewed-   Goals are clear. Patient working with [employer] on [employer].  Plan for d/c home with outpatient Palliative support.   Palliative will follow peripherally- please call if acute Palliative needs arise.   Ocie Bob, AGNP-C Palliative Medicine  No charge

## 2023-10-07 DIAGNOSIS — I5023 Acute on chronic systolic (congestive) heart failure: Secondary | ICD-10-CM | POA: Diagnosis not present

## 2023-10-07 LAB — CBC
HCT: 28.7 % — ABNORMAL LOW (ref 36.0–46.0)
Hemoglobin: 8.9 g/dL — ABNORMAL LOW (ref 12.0–15.0)
MCH: 26.9 pg (ref 26.0–34.0)
MCHC: 31 g/dL (ref 30.0–36.0)
MCV: 86.7 fL (ref 80.0–100.0)
Platelets: 253 10*3/uL (ref 150–400)
RBC: 3.31 MIL/uL — ABNORMAL LOW (ref 3.87–5.11)
RDW: 14 % (ref 11.5–15.5)
WBC: 4.1 10*3/uL (ref 4.0–10.5)
nRBC: 0 % (ref 0.0–0.2)

## 2023-10-07 LAB — BASIC METABOLIC PANEL
Anion gap: 11 (ref 5–15)
BUN: 76 mg/dL — ABNORMAL HIGH (ref 6–20)
CO2: 26 mmol/L (ref 22–32)
Calcium: 8.1 mg/dL — ABNORMAL LOW (ref 8.9–10.3)
Chloride: 94 mmol/L — ABNORMAL LOW (ref 98–111)
Creatinine, Ser: 2.36 mg/dL — ABNORMAL HIGH (ref 0.44–1.00)
GFR, Estimated: 25 mL/min — ABNORMAL LOW (ref >60)
Glucose, Bld: 163 mg/dL — ABNORMAL HIGH (ref 70–99)
Potassium: 3.5 mmol/L (ref 3.5–5.1)
Sodium: 131 mmol/L — ABNORMAL LOW (ref 135–145)

## 2023-10-07 LAB — COOXEMETRY PANEL
Carboxyhemoglobin: 1.9 % — ABNORMAL HIGH (ref 0.5–1.5)
Methemoglobin: 0.8 % (ref 0.0–1.5)
O2 Saturation: 86.7 %
Total hemoglobin: 9.2 g/dL — ABNORMAL LOW (ref 12.0–16.0)

## 2023-10-07 LAB — GLUCOSE, CAPILLARY
Glucose-Capillary: 123 mg/dL — ABNORMAL HIGH (ref 70–99)
Glucose-Capillary: 128 mg/dL — ABNORMAL HIGH (ref 70–99)
Glucose-Capillary: 103 mg/dL — ABNORMAL HIGH (ref 70–99)
Glucose-Capillary: 114 mg/dL — ABNORMAL HIGH (ref 70–99)

## 2023-10-07 LAB — MAGNESIUM: Magnesium: 2 mg/dL (ref 1.7–2.4)

## 2023-10-07 MED ORDER — SODIUM CHLORIDE 0.9 % IV SOLN
200.0000 mg | Freq: Once | INTRAVENOUS | Status: AC
  Administered 2023-10-07: 200 mg via INTRAVENOUS
  Filled 2023-10-07: qty 10

## 2023-10-07 MED ORDER — POTASSIUM CHLORIDE CRYS ER 20 MEQ PO TBCR
60.0000 meq | EXTENDED_RELEASE_TABLET | ORAL | Status: AC
  Administered 2023-10-07 (×3): 60 meq via ORAL
  Filled 2023-10-07 (×3): qty 3

## 2023-10-07 MED ORDER — TRIMETHOBENZAMIDE HCL 100 MG/ML IM SOLN: 200.0000 mg | Freq: Three times a day (TID) | INTRAMUSCULAR | Status: DC | PRN

## 2023-10-07 NOTE — Progress Notes (Signed)
 Patient ID: Karen Bennett, female   DOB: August 11, 1975, 48 y.o.   MRN: [id number]     Advanced Heart Failure Rounding Note  Cardiologist: Chilton Si, MD  Chief Complaint: acute on chronic systolic heart failure Subjective:    Remains on milrinone 0.25, co-ox 87%  Not sure all UOP measured yesterday and weight is not accurate.  Creatinine 2.65 => 2.43 => 2.36. CVP lower today at 13-14.  She is on Lasix gtt 30 mg/hr, metolazone 10 bid, acetazolamide 500 bid.   Breathing ok at rest.    Objective:   Weight Range: (!) 219.5 kg Body mass index is 78.1 kg/m.   Vital Signs:   Temp:  [97.5 F (36.4 C)-98.9 F (37.2 C)] 98.9 F (37.2 C) (03/16 0822) Pulse Rate:  [90-97] 90 (03/16 0900) Resp:  [19-20] 19 (03/15 2330) BP: (106-123)/(65-90) 111/90 (03/16 0822) SpO2:  [94 %-97 %] 94 % (03/16 0900) Weight:  [219.5 kg] 219.5 kg (03/16 0636) Last BM Date : 10/06/23  Weight change: Filed Weights   10/05/23 0519 10/06/23 0500 10/07/23 0636  Weight: (!) 197.9 kg (!) 200.3 kg (!) 219.5 kg    Intake/Output:   Intake/Output Summary (Last 24 hours) at 10/07/2023 1004 Last data filed at 10/07/2023 0935 Gross per 24 hour  Intake 780.79 ml  Output 2451 ml  Net -1670.21 ml     Physical Exam   CVP 13-14 General: NAD Neck: JVP 12-14, no thyromegaly or thyroid nodule.  Lungs: Clear to auscultation bilaterally with normal respiratory effort. CV: Nondisplaced PMI.  Heart regular S1/S2, no S3/S4, no murmur.  1+ chronic edema to knee on left.    Abdomen: Soft, nontender, no hepatosplenomegaly, no distention.  Skin: Intact without lesions or rashes.  Neurologic: Alert and oriented x 3.  Psych: Normal affect. Extremities: Right BKA  HEENT: Normal.   Telemetry   NSR 90s, personally reviewed   EKG    No new EKG to review  Labs    CBC Recent Labs    10/07/23 0500  WBC 4.1  HGB 8.9*  HCT 28.7*  MCV 86.7  PLT 253    Basic Metabolic Panel Recent Labs    40/98/11 0355  10/06/23 1631 10/07/23 0500  NA 129* 131* 131*  K 3.4* 3.3* 3.5  CL 94* 95* 94*  CO2 24 26 26   GLUCOSE 191* 104* 163*  BUN 73* 72* 76*  CREATININE 2.43* 2.29* 2.36*  CALCIUM 7.9* 7.9* 8.1*  MG 1.8  --  2.0   Liver Function Tests No results for input(s): "AST", "ALT", "ALKPHOS", "BILITOT", "PROT", "ALBUMIN" in the last 72 hours. No results for input(s): "LIPASE", "AMYLASE" in the last 72 hours. Cardiac Enzymes No results for input(s): "CKTOTAL", "CKMB", "CKMBINDEX", "TROPONINI" in the last 72 hours.  BNP: BNP (last 3 results) Recent Labs    02/09/23 1736 08/01/23 1140 09/22/23 0215  BNP 345.1* 523.3* 572.3*    ProBNP (last 3 results) No results for input(s): "PROBNP" in the last 8760 hours.   D-Dimer No results for input(s): "DDIMER" in the last 72 hours. Hemoglobin A1C No results for input(s): "HGBA1C" in the last 72 hours. Fasting Lipid Panel No results for input(s): "CHOL", "HDL", "LDLCALC", "TRIG", "CHOLHDL", "LDLDIRECT" in the last 72 hours. Thyroid Function Tests No results for input(s): "TSH", "T4TOTAL", "T3FREE", "THYROIDAB" in the last 72 hours.  Invalid input(s): "FREET3"  Other results:   Imaging    No results found.   Medications:     Scheduled Medications:  acetaZOLAMIDE  500  mg Oral BID   apixaban  5 mg Oral BID   atorvastatin  40 mg Oral QPM   clotrimazole  1 Application Topical BID   ezetimibe  10 mg Oral Daily   insulin aspart  0-15 Units Subcutaneous TID WC   insulin aspart  0-5 Units Subcutaneous QHS   insulin glargine  10 Units Subcutaneous QHS   levothyroxine  75 mcg Oral QAC breakfast   metolazone  10 mg Oral BID   mexiletine  250 mg Oral Q12H   polyethylene glycol  17 g Oral Daily   potassium chloride  60 mEq Oral Q4H   senna-docusate  1 tablet Oral BID   sodium chloride flush  10-40 mL Intracatheter Q12H    Infusions:  furosemide (LASIX) 200 mg in dextrose 5 % 100 mL (2 mg/mL) infusion 30 mg/hr (10/07/23 0424)   iron  sucrose     milrinone 0.25 mcg/kg/min (10/07/23 0418)   promethazine (PHENERGAN) injection (IM or IVPB) 6.25 mg (10/07/23 0420)    PRN Medications: acetaminophen **OR** acetaminophen, diphenhydrAMINE-zinc acetate, Gerhardt's butt cream, hydrOXYzine, mineral oil-hydrophilic petrolatum, mouth rinse, oxyCODONE, prochlorperazine, promethazine (PHENERGAN) injection (IM or IVPB), sodium chloride flush    Patient Profile   [name] with CKD stage III, hypothyroidism, mitral valve endocarditis in 2022, history of CVA, right BKA, severe morbid obesity (BMI 70) and chronic biventricular failure currently admitted with heart failure exacerbation.   Assessment/Plan   1. A/C Biventricular HF EF continues to drop. Previous Echo EF 35-40% EF but now 25-30% + RV failure. Etiology unclear. ? PVC induced? She has not had ischemic work up due to weight/CKD.  Readmitted with marked volume overload. Poor response with intermittent IV lasix and IV bumex. Milrinone added to augment diuresis.  - remains on milrinone 0.25, Co-ox >80%  - Hard to follow I/Os and weight inaccurate.  However, CVP seems to be coming down (13-14 today).  Continue lasix gtt @30  mg w/ aggressive K supplementation. Continue metolazone 10 bid + Diamox 500 mg bid  - GDMT limited by hypotension and AKI  - Will continue aggressive diuresis as long as creatinine tolerates, reassess tomorrow.  Probably can switch to po diuretics when CVP is around 10. We are getting close.    2. CKD  Stage IV on AKI  - SCr 3.2 on admit, b/l ~2.0  - SCr trending down, 2.7 => 2.43 => 2.36 today  - continue milrinone + diuresis per above - follow BMET    3. Hypothyroidism  - Recent TSH stable.    4. PVCs  Continue mexiletine 250 every 12 hours.    5. H/O PE/DVT - Continue eliquis 5 mg bid.    6.Suspected OHS/OSA - Refuses sleep study   7. Severe Morbid Obesity.  DMII. HGb A1C 7.2 Consider GLP1 as an outpatient.  Body mass index is 69.65 kg/m.    8. Right BKA   9. Hypokalemia - Continue aggressive replacement.   Length of Stay: 15  Marca Ancona, MD  10/07/2023, 10:04 AM  Advanced Heart Failure Team Pager [phone] (M-F; 7a - 5p)  Please contact [facility] for night-coverage after hours (5p -7a ) and weekends on [email or web address]

## 2023-10-07 NOTE — Progress Notes (Addendum)
 PROGRESS NOTE    Karen Bennett  UUV:[id number] DOB: 08/14/1975 DOA: 09/22/2023 PCP: [name], NP  47/F w obesity class 3, chronic systolic CHF, HTN, HLD, IDDM, hypothyroidism, prior DVT/PE, history of [place] mitral valve endocarditis in 2022, pulmonary hypertension, CKD 3B, right BKA, prior CVA, presented to ED with worsening abdominal distention, lower extremity swelling and shortness of breath.  -recently hospitalized and discharged in January 2025 for acute on chronic systolic CHF, required significant diuresis with milrinone.  -In the ED she was found to have volume overload, acute kidney injury, and hyperkalemia.  -placed on IV furosemide, and inotropic support with milrinone.  -3/07 transferred to [facility]  -3/9-14: Continued on high-dose diuretics -3/13: Palliative care meeting, wishes for full code, full scope  Subjective: -No events overnight, denies complaints, off O2 this morning  Assessment and Plan:  Acute on chronic systolic heart failure (HCC) BiV Failure Pulm HTN -Echo w/ EF 25 to 30%, severe LV cavity dilatation, RV moderately reduced, RVSP 78.2  -CHF team following, on Lasix gtt. 30 Mg per hour and milrinone  -response to diuretics improving, bed weights inaccurate -GDMT limited by AKI/CKD -poor SGLT2I candidate and has foley  -Palliative consulted for goals of care, overall poor prognosis with limited support> patient wishes for full code and full scope -[facility] declines SNF -Transition to oral diuretics soon, will need to maximize home health services  Hypokalemia, hypomagnesemia -Replaced  Acute on chronic anemia -Prior anemia panel with iron deficiency, add IV iron today  Frequent PVC,  -continue mexiletine, monitor lytes  AKI on CKD4 Hyponatremia, hypokalemia. (Base serum cr 2,0 -creat was 3.2 on admission, now improving on milrinone  History of pulmonary embolism Continue apixaban.   Type 2 diabetes mellitus  -CBGs are stable on current  dose of glargine  Hx of right BKA (HCC) PT and OT consulted, she uses a power chair at baseline and transfers to the toilet, declines SNF  Hypothyroidism Continue with levothyroxine   Morbid Obesity OSA/OHS -declined sleep study, needs significant wt loss -Attempt nighttime BiPAP  History of CVA (cerebrovascular accident) -Continue apixaban and statin  Anxiety and depression Continue with as needed hydroxyzine    DVT prophylaxis: apixaban Code Status: Full Code Family Communication: None present Disposition Plan: Home likely 48 hours  Consultants:    Procedures:   Antimicrobials:    Objective: Vitals:   10/06/23 2330 10/07/23 0636 10/07/23 0822 10/07/23 0900  BP: 123/71  (!) 111/90   Pulse: 97 92 96 90  Resp: 19     Temp: 97.7 F (36.5 C)  98.9 F (37.2 C)   TempSrc: Oral  Oral   SpO2: 97% 97% 97% 94%  Weight:  (!) 219.5 kg    Height:        Intake/Output Summary (Last 24 hours) at 10/07/2023 1034 Last data filed at 10/07/2023 0935 Gross per 24 hour  Intake 1343.05 ml  Output 2451 ml  Net -1107.95 ml   Filed Weights   10/05/23 0519 10/06/23 0500 10/07/23 0636  Weight: (!) 197.9 kg (!) 200.3 kg (!) 219.5 kg    Examination:  Gen: Obese chronically ill female sitting up in bed, AAOx3 HEENT: Neck obese unable to assess JVD CVS: S1-S2, regular rhythm Lungs: Distant breath sounds Abdomen: Soft, obese, nontender, bowel sounds present Extremities: 1+ edema in upper thighs-improving, Unna boots noted, right BKA  Skin: no new rashes on exposed skin   Data Reviewed:   CBC: Recent Labs  Lab 10/07/23 0500  WBC 4.1  HGB 8.9*  HCT 28.7*  MCV 86.7  PLT 253   Basic Metabolic Panel: Recent Labs  Lab 10/03/23 0713 10/03/23 1400 10/04/23 0400 10/04/23 1749 10/05/23 0410 10/05/23 1440 10/05/23 2235 10/06/23 0355 10/06/23 1631 10/07/23 0500  NA 130*   < > 131*   < > 129* 131* 130* 129* 131* 131*  K 2.7*   < > 3.3*   < > 2.8* 3.0* 3.5 3.4* 3.3*  3.5  CL 93*   < > 93*   < > 91* 93* 93* 94* 95* 94*  CO2 28   < > 29   < > 26 27 28 24 26 26   GLUCOSE 106*   < > 122*   < > 154* 125* 131* 191* 104* 163*  BUN 75*   < > 75*   < > 77* 78* 77* 73* 72* 76*  CREATININE 2.85*   < > 2.62*   < > 2.71* 2.64* 2.65* 2.43* 2.29* 2.36*  CALCIUM 8.0*   < > 8.3*   < > 8.3* 8.1* 8.3* 7.9* 7.9* 8.1*  MG 2.0  --  1.9  --  1.8  --   --  1.8  --  2.0   < > = values in this interval not displayed.   GFR: Estimated Creatinine Clearance: 58 mL/min (A) (by C-G formula based on SCr of 2.36 mg/dL (H)). Liver Function Tests: No results for input(s): "AST", "ALT", "ALKPHOS", "BILITOT", "PROT", "ALBUMIN" in the last 168 hours.  No results for input(s): "LIPASE", "AMYLASE" in the last 168 hours. No results for input(s): "AMMONIA" in the last 168 hours. Coagulation Profile: No results for input(s): "INR", "PROTIME" in the last 168 hours. Cardiac Enzymes: No results for input(s): "CKTOTAL", "CKMB", "CKMBINDEX", "TROPONINI" in the last 168 hours. BNP (last 3 results) No results for input(s): "PROBNP" in the last 8760 hours. HbA1C: No results for input(s): "HGBA1C" in the last 72 hours. CBG: Recent Labs  Lab 10/06/23 0626 10/06/23 1201 10/06/23 1557 10/06/23 2128 10/07/23 0615  GLUCAP 103* 100* 108* 112* 123*   Lipid Profile: No results for input(s): "CHOL", "HDL", "LDLCALC", "TRIG", "CHOLHDL", "LDLDIRECT" in the last 72 hours. Thyroid Function Tests: No results for input(s): "TSH", "T4TOTAL", "FREET4", "T3FREE", "THYROIDAB" in the last 72 hours. Anemia Panel: No results for input(s): "VITAMINB12", "FOLATE", "FERRITIN", "TIBC", "IRON", "RETICCTPCT" in the last 72 hours. Urine analysis:    Component Value Date/Time   COLORURINE AMBER (A) 03/22/2023 0426   APPEARANCEUR TURBID (A) 03/22/2023 0426   LABSPEC 1.040 (H) 03/22/2023 0426   PHURINE 5.0 03/22/2023 0426   GLUCOSEU >=500 (A) 03/22/2023 0426   HGBUR LARGE (A) 03/22/2023 0426   BILIRUBINUR  NEGATIVE 03/22/2023 0426   KETONESUR NEGATIVE 03/22/2023 0426   PROTEINUR >=300 (A) 03/22/2023 0426   UROBILINOGEN 0.2 09/05/2011 0558   NITRITE POSITIVE (A) 03/22/2023 0426   LEUKOCYTESUR MODERATE (A) 03/22/2023 0426   Sepsis Labs: @LABRCNTIP (procalcitonin:4,lacticidven:4)  ) Recent Results (from the past 240 hours)  MRSA Next Gen by PCR, Nasal     Status: None   Collection Time: 09/27/23 10:54 PM   Specimen: Nasal Mucosa; Nasal Swab  Result Value Ref Range Status   MRSA by PCR Next Gen NOT DETECTED NOT DETECTED Final    Comment: (NOTE) The GeneXpert MRSA Assay (FDA approved for NASAL specimens only), is one component of a comprehensive MRSA colonization surveillance program. It is not intended to diagnose MRSA infection nor to guide or monitor treatment for MRSA infections. Test performance is not FDA  approved in patients less than 52 years old. Performed at [facility], [address] [place], [place] [address]      Radiology Studies: No results found.   Scheduled Meds:  acetaZOLAMIDE  500 mg Oral BID   apixaban  5 mg Oral BID   atorvastatin  40 mg Oral QPM   clotrimazole  1 Application Topical BID   ezetimibe  10 mg Oral Daily   insulin aspart  0-15 Units Subcutaneous TID WC   insulin aspart  0-5 Units Subcutaneous QHS   insulin glargine  10 Units Subcutaneous QHS   levothyroxine  75 mcg Oral QAC breakfast   metolazone  10 mg Oral BID   mexiletine  250 mg Oral Q12H   polyethylene glycol  17 g Oral Daily   potassium chloride  60 mEq Oral Q4H   senna-docusate  1 tablet Oral BID   sodium chloride flush  10-40 mL Intracatheter Q12H   Continuous Infusions:  furosemide (LASIX) 200 mg in dextrose 5 % 100 mL (2 mg/mL) infusion Stopped (10/07/23 1005)   milrinone 0.25 mcg/kg/min (10/07/23 0701)   promethazine (PHENERGAN) injection (IM or IVPB) Stopped (10/07/23 0440)     LOS: 15 days    Time spent:    Zannie Cove, MD [facility]   10/07/2023, 10:34 AM

## 2023-10-07 NOTE — Plan of Care (Signed)
  Problem: Coping: Goal: Ability to adjust to condition or change in health will improve Outcome: Progressing   Problem: Fluid Volume: Goal: Ability to maintain a balanced intake and output will improve Outcome: Progressing   Problem: Health Behavior/Discharge Planning: Goal: Ability to manage health-related needs will improve Outcome: Progressing   Problem: Metabolic: Goal: Ability to maintain appropriate glucose levels will improve Outcome: Progressing   Problem: Nutritional: Goal: Maintenance of adequate nutrition will improve Outcome: Progressing   Problem: Education: Goal: Knowledge of General Education information will improve Description: Including pain rating scale, medication(s)/side effects and non-pharmacologic comfort measures Outcome: Progressing   Problem: Health Behavior/Discharge Planning: Goal: Ability to manage health-related needs will improve Outcome: Progressing   Problem: Clinical Measurements: Goal: Respiratory complications will improve Outcome: Progressing Goal: Cardiovascular complication will be avoided Outcome: Progressing

## 2023-10-08 DIAGNOSIS — I5023 Acute on chronic systolic (congestive) heart failure: Secondary | ICD-10-CM | POA: Diagnosis not present

## 2023-10-08 LAB — BASIC METABOLIC PANEL
Anion gap: 8 (ref 5–15)
BUN: 72 mg/dL — ABNORMAL HIGH (ref 6–20)
CO2: 28 mmol/L (ref 22–32)
Calcium: 8 mg/dL — ABNORMAL LOW (ref 8.9–10.3)
Chloride: 96 mmol/L — ABNORMAL LOW (ref 98–111)
Creatinine, Ser: 2.15 mg/dL — ABNORMAL HIGH (ref 0.44–1.00)
GFR, Estimated: 28 mL/min — ABNORMAL LOW (ref >60)
Glucose, Bld: 99 mg/dL (ref 70–99)
Potassium: 3.6 mmol/L (ref 3.5–5.1)
Sodium: 132 mmol/L — ABNORMAL LOW (ref 135–145)

## 2023-10-08 LAB — CBC
HCT: 31.5 % — ABNORMAL LOW (ref 36.0–46.0)
Hemoglobin: 9.6 g/dL — ABNORMAL LOW (ref 12.0–15.0)
MCH: 26.7 pg (ref 26.0–34.0)
MCHC: 30.5 g/dL (ref 30.0–36.0)
MCV: 87.5 fL (ref 80.0–100.0)
Platelets: 294 10*3/uL (ref 150–400)
RBC: 3.6 MIL/uL — ABNORMAL LOW (ref 3.87–5.11)
RDW: 14.1 % (ref 11.5–15.5)
WBC: 4.6 10*3/uL (ref 4.0–10.5)
nRBC: 0 % (ref 0.0–0.2)

## 2023-10-08 LAB — COOXEMETRY PANEL
Carboxyhemoglobin: 1.7 % — ABNORMAL HIGH (ref 0.5–1.5)
Methemoglobin: <0.7 % (ref 0.0–1.5)
O2 Saturation: 55.1 %
Total hemoglobin: 10 g/dL — ABNORMAL LOW (ref 12.0–16.0)

## 2023-10-08 LAB — GLUCOSE, CAPILLARY
Glucose-Capillary: 95 mg/dL (ref 70–99)
Glucose-Capillary: 101 mg/dL — ABNORMAL HIGH (ref 70–99)
Glucose-Capillary: 111 mg/dL — ABNORMAL HIGH (ref 70–99)
Glucose-Capillary: 114 mg/dL — ABNORMAL HIGH (ref 70–99)

## 2023-10-08 LAB — MAGNESIUM: Magnesium: 2.1 mg/dL (ref 1.7–2.4)

## 2023-10-08 MED ORDER — POTASSIUM CHLORIDE CRYS ER 20 MEQ PO TBCR
60.0000 meq | EXTENDED_RELEASE_TABLET | ORAL | Status: AC
  Administered 2023-10-08 (×2): 60 meq via ORAL
  Filled 2023-10-08 (×2): qty 3

## 2023-10-08 MED ORDER — SPIRONOLACTONE 12.5 MG HALF TABLET
12.5000 mg | ORAL_TABLET | Freq: Every day | ORAL | Status: DC
  Administered 2023-10-08: 12.5 mg via ORAL
  Filled 2023-10-08: qty 1

## 2023-10-08 NOTE — Progress Notes (Addendum)
 Patient ID: ALETTE KATAOKA, female   DOB: [birth year], 48 y.o.   MRN: [id number]     Advanced Heart Failure Rounding Note  Cardiologist: Chilton Si, MD  Chief Complaint: acute on chronic systolic heart failure Subjective:   Over the weekend continued to diurese with Lasix gtt 30 mg/hr, metolazone 10 bid, acetazolamide 500 bid.   Remains on milrinone 0.25, CO-OX 55%  Bed weights aren't accurate.   Denies SOB.   Objective:   Weight Range: (!) 224 kg Body mass index is 79.72 kg/m.   Vital Signs:   Temp:  [97.8 F (36.6 C)-98.9 F (37.2 C)] 97.8 F (36.6 C) (03/17 0316) Pulse Rate:  [90-100] 95 (03/17 0316) Resp:  [16-18] 16 (03/17 0316) BP: (96-115)/(47-90) 115/54 (03/17 0316) SpO2:  [91 %-97 %] 91 % (03/17 0316) Weight:  [194 kg-224 kg] 224 kg (03/17 0505) Last BM Date : 10/06/23  Weight change: Filed Weights   10/07/23 0636 10/07/23 1553 10/08/23 0505  Weight: (!) 219.5 kg (!) 194 kg (!) 224 kg    Intake/Output:   Intake/Output Summary (Last 24 hours) at 10/08/2023 0723 Last data filed at 10/07/2023 2252 Gross per 24 hour  Intake 490 ml  Output 1850 ml  Net -1360 ml    CVP 11-12  Physical Exam  General:   No resp difficulty. Obese in bed.  Neck: supple. JVP difficult to assess due to body habitus.  Cor: PMI nondisplaced. Regular rate & rhythm. No rubs, gallops or murmurs. Lungs: clear Abdomen: soft, nontender, nondistended.  Extremities: no cyanosis, clubbing, rash, trace edema. RBKA. LUE PICC  Neuro: alert & oriented x3  Telemetry   SR 90s   EKG    No new EKG to review  Labs    CBC Recent Labs    10/07/23 0500 10/08/23 0400  WBC 4.1 4.6  HGB 8.9* 9.6*  HCT 28.7* 31.5*  MCV 86.7 87.5  PLT 253 294    Basic Metabolic Panel Recent Labs    59/56/38 0500 10/08/23 0352  NA 131* 132*  K 3.5 3.6  CL 94* 96*  CO2 26 28  GLUCOSE 163* 99  BUN 76* 72*  CREATININE 2.36* 2.15*  CALCIUM 8.1* 8.0*  MG 2.0 2.1   Liver Function Tests No  results for input(s): "AST", "ALT", "ALKPHOS", "BILITOT", "PROT", "ALBUMIN" in the last 72 hours. No results for input(s): "LIPASE", "AMYLASE" in the last 72 hours. Cardiac Enzymes No results for input(s): "CKTOTAL", "CKMB", "CKMBINDEX", "TROPONINI" in the last 72 hours.  BNP: BNP (last 3 results) Recent Labs    02/09/23 1736 08/01/23 1140 09/22/23 0215  BNP 345.1* 523.3* 572.3*    ProBNP (last 3 results) No results for input(s): "PROBNP" in the last 8760 hours.   D-Dimer No results for input(s): "DDIMER" in the last 72 hours. Hemoglobin A1C No results for input(s): "HGBA1C" in the last 72 hours. Fasting Lipid Panel No results for input(s): "CHOL", "HDL", "LDLCALC", "TRIG", "CHOLHDL", "LDLDIRECT" in the last 72 hours. Thyroid Function Tests No results for input(s): "TSH", "T4TOTAL", "T3FREE", "THYROIDAB" in the last 72 hours.  Invalid input(s): "FREET3"  Other results:   Imaging    No results found.   Medications:     Scheduled Medications:  acetaZOLAMIDE  500 mg Oral BID   apixaban  5 mg Oral BID   atorvastatin  40 mg Oral QPM   clotrimazole  1 Application Topical BID   ezetimibe  10 mg Oral Daily   insulin aspart  0-15 Units Subcutaneous TID  WC   insulin aspart  0-5 Units Subcutaneous QHS   insulin glargine  10 Units Subcutaneous QHS   levothyroxine  75 mcg Oral QAC breakfast   metolazone  10 mg Oral BID   mexiletine  250 mg Oral Q12H   polyethylene glycol  17 g Oral Daily   senna-docusate  1 tablet Oral BID   sodium chloride flush  10-40 mL Intracatheter Q12H    Infusions:  furosemide (LASIX) 200 mg in dextrose 5 % 100 mL (2 mg/mL) infusion 30 mg/hr (10/08/23 0342)   milrinone 0.25 mcg/kg/min (10/07/23 2347)   promethazine (PHENERGAN) injection (IM or IVPB) 6.25 mg (10/07/23 2346)    PRN Medications: acetaminophen **OR** acetaminophen, diphenhydrAMINE-zinc acetate, Gerhardt's butt cream, hydrOXYzine, mineral oil-hydrophilic petrolatum, mouth rinse,  oxyCODONE, promethazine (PHENERGAN) injection (IM or IVPB), sodium chloride flush    Patient Profile   [name] with CKD stage III, hypothyroidism, mitral valve endocarditis in 2022, history of CVA, right BKA, severe morbid obesity (BMI 70) and chronic biventricular failure currently admitted with heart failure exacerbation.   Assessment/Plan   1. A/C Biventricular HF EF continues to drop. Previous Echo EF 35-40% EF but now 25-30% + RV failure. Etiology unclear. ? PVC induced? She has not had ischemic work up due to weight/CKD.  Readmitted with marked volume overload. Poor response with intermittent IV lasix and IV bumex. Milrinone added to augment diuresis.  - remains on milrinone 0.25, CO-OX 55%.  - CVP 11-12. Needs another day of aggressive diuresis.  Continue lasix gtt @30  mg w/ aggressive K supplementation. Continue metolazone 10 bid + Diamox 500 mg bid  - GDMT limited by hypotension and AKI  - Add 12.5 mg spiro daily.  - Will continue aggressive diuresis as long as creatinine tolerates, reassess tomorrow.  Probably can switch to po diuretics when CVP is around 10. We are getting close.    2. CKD  Stage IV on AKI  - SCr 3.2 on admit, b/l ~2.0  - SCr trending down, 2.1  today, back to baseline.  - continue milrinone + diuresis per above - follow BMET    3. Hypothyroidism  - Recent TSH stable.    4. PVCs  Continue mexiletine 250 every 12 hours.    5. H/O PE/DVT - Continue eliquis 5 mg bid.    6.Suspected OHS/OSA - Refuses sleep study   7. Severe Morbid Obesity.  DMII. HGb A1C 7.2 Consider GLP1 as an outpatient.  Body mass index is 69.65 kg/m.   8. Right BKA   9. Hypokalemia - Supp K. Adding Cleda Daub as above.   10. GOC Palliative Care following.   Disposition: Planning HH /[facility] for Palliative Care   Length of Stay: [address] NP  10/08/2023, 7:23 AM  Advanced Heart Failure Team Pager [phone] (M-F; 7a - 5p)  Please contact [facility] for night-coverage after hours (5p -7a ) and weekends on [email or web address]  Patient seen and examined with the above-signed Advanced Practice Provider and/or Housestaff. I personally reviewed laboratory data, imaging studies and relevant notes. I independently examined the patient and formulated the important aspects of the plan. I have edited the note to reflect any of my changes or salient points. I have personally discussed the plan with the patient and/or family.  Remains on milrinone 0.25. Co-ox 55%. CVP remains elevated. SCr improved 3.2 -> 2.1  Feels weak. No SOB.   General:  Sitting up in bed No resp difficulty HEENT: normal Neck: supple. no  JVD. Carotids 2+ bilat; no bruits. No lymphadenopathy or thryomegaly appreciated. Cor: PMI nondisplaced. Regular rate & rhythm. No rubs, gallops or murmurs. Lungs: clear Abdomen: obese soft, nontender, nondistended. No hepatosplenomegaly. No bruits or masses. Good bowel sounds. Extremities: no cyanosis, clubbing, rash, 2+ edema  R BKA Neuro: alert & orientedx3, cranial nerves grossly intact. moves all 4 extremities w/o difficulty. Affect pleasant  Remains very tenuous. Coox marginal on milrinone. Will continue milrinone and IV lasix.   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 Palliative Care onboard and I reviewed their notes personally.   Arvilla Meres, MD  5:34 PM

## 2023-10-08 NOTE — Progress Notes (Signed)
 Physical Therapy Treatment Patient Details Name: Karen Bennett MRN: [id number] DOB: 08-08-1975 Today's Date: 10/08/2023   History of Present Illness Pt is a 48 y.o. female admitted to [facility] from [facility] on 09/28/23 with LE edema, SOB, and abdominal distention. Chest x-ray showed CHF w/ layering B pleural effusions. Admitted w/ acute on chronic HFrEF, AKI, and hyperkalemia. Prior admit 1/8-1/28 for CHF exacerbation requiring aggressive diuresis and inotropic support. PMH significant for DM2, HTN, HLD, hypothyroidism, CKD IIIa, morbid obesity, Rt BKA, PE on Xarelto, DVT, endocarditis, cellulitis, lumbar radiculopathy, anxiety, depression, CVA.    PT Comments  Patient initially agreed to work on OOB transfer to bari-recliner. Patient required +2 min assist to pivot (in long-sitting) to position herself for posterior transfer. Patient became anxious and reported feeling unsafe and not ready to try transfer. Then reported need for bedpan and assisted back to supine +2 min assist to pivot. Rolled with rail and min assist (with use of momentum). Nursing made aware she is on bedpan.     If plan is discharge home, recommend the following: Assistance with cooking/housework;Assist for transportation;A little help with walking and/or transfers;A lot of help with bathing/dressing/bathroom   Can travel by private vehicle        Equipment Recommendations  None recommended by PT    Recommendations for Other Services       Precautions / Restrictions Precautions Precautions: Fall;Other (comment) Precaution/Restrictions Comments: h/o R BKA, pt reports is NWB due to spinter femur fx in 2018 Restrictions Weight Bearing Restrictions Per Provider Order: Yes RLE Weight Bearing Per Provider Order: Non weight bearing     Mobility  Bed Mobility Overal bed mobility: Needs Assistance Bed Mobility: Rolling, Supine to Sit Rolling: Min assist, Used rails   Supine to sit: Independent (to come to long sitting in the  bed) Sit to supine: Independent (from long sit in bed)   General bed mobility comments: Assist for management of B LE in rolling due to B LE edema and R LE pain.    Transfers Overall transfer level: Needs assistance Equipment used:  (bed pad) Transfers: Bed to chair/wheelchair/BSC         Anterior-Posterior transfers: +2 physical assistance, Min assist   General transfer comment: pt scooted to position herself for posterior transfer and then stated she felt like she needed bedpan and returned to supine to assist onto bedpan    Ambulation/Gait               General Gait Details: power w/c at baseline   Stairs             Wheelchair Mobility     Tilt Bed    Modified Rankin (Stroke Patients Only)       Balance Overall balance assessment: Independent (in sitting) Sitting-balance support: No upper extremity supported, Feet supported (in long sitting in bed) Sitting balance-Leahy Scale: Good                                      Communication Communication Communication: No apparent difficulties  Cognition Arousal: Alert Behavior During Therapy: WFL for tasks assessed/performed   PT - Cognitive impairments: No apparent impairments                       PT - Cognition Comments: lethargic upon arrival, increased alertness throughout session Following commands: Intact      Cueing Cueing  Techniques: Verbal cues  Exercises      General Comments General comments (skin integrity, edema, etc.): Bari-recliner located and placed in pt's room for future use.      Pertinent Vitals/Pain Pain Assessment Pain Assessment: No/denies pain    Home Living                          Prior Function            PT Goals (current goals can now be found in the care plan section) Acute Rehab PT Goals Patient Stated Goal: to decrease swelling in legs and be able to move easier Time For Goal Achievement: 10/18/23 Potential to  Achieve Goals: Good Progress towards PT goals: Progressing toward goals    Frequency    Min 2X/week      PT Plan      Co-evaluation              AM-PAC PT "6 Clicks" Mobility   Outcome Measure  Help needed turning from your back to your side while in a flat bed without using bedrails?: None Help needed moving from lying on your back to sitting on the side of a flat bed without using bedrails?: None Help needed moving to and from a bed to a chair (including a wheelchair)?: A Little Help needed standing up from a chair using your arms (e.g., wheelchair or bedside chair)?: Total Help needed to walk in hospital room?: Total Help needed climbing 3-5 steps with a railing? : Total 6 Click Score: 14    End of Session   Activity Tolerance: Other (comment) (limited by anxiety and need for bedpan) Patient left: in bed;with call bell/phone within reach Nurse Communication: Mobility status PT Visit Diagnosis: Other abnormalities of gait and mobility (R26.89);Muscle weakness (generalized) (M62.81)     Time: 1610-9604 PT Time Calculation (min) (ACUTE ONLY): 20 min  Charges:    $Therapeutic Activity: 8-22 mins PT General Charges $$ ACUTE PT VISIT: 1 Visit                      Jerolyn Center, PT Acute Rehabilitation Services  Office [phone]    Zena Amos 10/08/2023, 2:36 PM

## 2023-10-08 NOTE — Progress Notes (Signed)
 PROGRESS NOTE    Karen Bennett  HYQ:[id number] DOB: 21-Mar-1976 DOA: 09/22/2023 PCP: [name], NP  47/F w obesity class 3, chronic systolic CHF, HTN, HLD, IDDM, hypothyroidism, prior DVT/PE, history of Candida mitral valve endocarditis in 2022, pulmonary hypertension, CKD 3B, right BKA, prior CVA, presented to ED with worsening abdominal distention, lower extremity swelling and shortness of breath.  -recently hospitalized and discharged in January 2025 for acute on chronic systolic CHF, required significant diuresis with milrinone.  -In the ED she was found to have volume overload, acute kidney injury, and hyperkalemia.  -placed on IV furosemide, and inotropic support with milrinone.  -3/07 transferred to [facility]  -3/9-14: Continued on high-dose diuretics -3/13: Palliative care meeting, wishes for full code, full scope  Subjective: -Feels okay, no events overnight, denies any dyspnea  Assessment and Plan:  Acute on chronic systolic heart failure (HCC) BiV Failure Pulm HTN -Echo w/ EF 25 to 30%, severe LV cavity dilatation, RV moderately reduced, RVSP 78.2  -CHF team following, on Lasix gtt. 30 Mg per hour and milrinone  -Good response to diuretics now, at least 12.8 L negative, weight inaccurate -GDMT limited by AKI/CKD -poor SGLT2I candidate -Palliative consulted for goals of care, overall poor prognosis with limited support> patient wishes for full code and full scope -Adamantly declines SNF -Transition to oral diuretics in 1 to 2 days, maximize home health services and outpatient palliative care  Hypokalemia, hypomagnesemia -Replaced  Acute on chronic anemia -Prior anemia panel with iron deficiency, given IV iron 3/16  Frequent PVC,  -continue mexiletine, monitor lytes  AKI on CKD4 Hyponatremia, hypokalemia. (Base serum cr 2,0 -creat was 3.2 on admission, now improving on milrinone  History of pulmonary embolism Continue apixaban.   Type 2 diabetes mellitus   -CBGs are stable on current dose of glargine  Hx of right BKA (HCC) PT and OT consulted, she uses a power chair at baseline and transfers to the toilet, declines SNF  Hypothyroidism Continue with levothyroxine   Morbid Obesity OSA/OHS -declined sleep study, needs significant wt loss -Attempt nighttime BiPAP  History of CVA (cerebrovascular accident) -Continue apixaban and statin  Anxiety and depression Continue with as needed hydroxyzine    DVT prophylaxis: apixaban Code Status: Full Code Family Communication: None present Disposition Plan: Home likely 48 hours  Consultants:    Procedures:   Antimicrobials:    Objective: Vitals:   10/07/23 2331 10/08/23 0316 10/08/23 0505 10/08/23 0804  BP: (!) 104/47 (!) 115/54  108/61  Pulse:  95  96  Resp: 18 16    Temp: 98.8 F (37.1 C) 97.8 F (36.6 C)  97.6 F (36.4 C)  TempSrc: Oral Oral  Oral  SpO2: 94% 91%  91%  Weight:   (!) 224 kg (!) 194.5 kg  Height:        Intake/Output Summary (Last 24 hours) at 10/08/2023 0913 Last data filed at 10/08/2023 0805 Gross per 24 hour  Intake 490 ml  Output 2600 ml  Net -2110 ml   Filed Weights   10/07/23 1553 10/08/23 0505 10/08/23 0804  Weight: (!) 194 kg (!) 224 kg (!) 194.5 kg    Examination:  Gen: Obese chronically ill female sitting up in bed, AAOx3 HEENT: Neck obese unable to assess JVD CVS: S1-S2, regular rhythm Lungs: Clear bilaterally Abdomen: Soft, nontender, bowel sounds present Extremities: Trace edema in left thigh, Unna boots noted, right BKA  Skin: no new rashes on exposed skin   Data Reviewed:   CBC: Recent  Labs  Lab 10/07/23 0500 10/08/23 0400  WBC 4.1 4.6  HGB 8.9* 9.6*  HCT 28.7* 31.5*  MCV 86.7 87.5  PLT 253 294   Basic Metabolic Panel: Recent Labs  Lab 10/04/23 0400 10/04/23 1749 10/05/23 0410 10/05/23 1440 10/05/23 2235 10/06/23 0355 10/06/23 1631 10/07/23 0500 10/08/23 0352  NA 131*   < > 129*   < > 130* 129* 131* 131*  132*  K 3.3*   < > 2.8*   < > 3.5 3.4* 3.3* 3.5 3.6  CL 93*   < > 91*   < > 93* 94* 95* 94* 96*  CO2 29   < > 26   < > 28 24 26 26 28   GLUCOSE 122*   < > 154*   < > 131* 191* 104* 163* 99  BUN 75*   < > 77*   < > 77* 73* 72* 76* 72*  CREATININE 2.62*   < > 2.71*   < > 2.65* 2.43* 2.29* 2.36* 2.15*  CALCIUM 8.3*   < > 8.3*   < > 8.3* 7.9* 7.9* 8.1* 8.0*  MG 1.9  --  1.8  --   --  1.8  --  2.0 2.1   < > = values in this interval not displayed.   GFR: Estimated Creatinine Clearance: 58.5 mL/min (A) (by C-G formula based on SCr of 2.15 mg/dL (H)). Liver Function Tests: No results for input(s): "AST", "ALT", "ALKPHOS", "BILITOT", "PROT", "ALBUMIN" in the last 168 hours.  No results for input(s): "LIPASE", "AMYLASE" in the last 168 hours. No results for input(s): "AMMONIA" in the last 168 hours. Coagulation Profile: No results for input(s): "INR", "PROTIME" in the last 168 hours. Cardiac Enzymes: No results for input(s): "CKTOTAL", "CKMB", "CKMBINDEX", "TROPONINI" in the last 168 hours. BNP (last 3 results) No results for input(s): "PROBNP" in the last 8760 hours. HbA1C: No results for input(s): "HGBA1C" in the last 72 hours. CBG: Recent Labs  Lab 10/07/23 0615 10/07/23 1147 10/07/23 1559 10/07/23 2102 10/08/23 0557  GLUCAP 123* 128* 103* 114* 95   Lipid Profile: No results for input(s): "CHOL", "HDL", "LDLCALC", "TRIG", "CHOLHDL", "LDLDIRECT" in the last 72 hours. Thyroid Function Tests: No results for input(s): "TSH", "T4TOTAL", "FREET4", "T3FREE", "THYROIDAB" in the last 72 hours. Anemia Panel: No results for input(s): "VITAMINB12", "FOLATE", "FERRITIN", "TIBC", "IRON", "RETICCTPCT" in the last 72 hours. Urine analysis:    Component Value Date/Time   COLORURINE AMBER (A) 03/22/2023 0426   APPEARANCEUR TURBID (A) 03/22/2023 0426   LABSPEC 1.040 (H) 03/22/2023 0426   PHURINE 5.0 03/22/2023 0426   GLUCOSEU >=500 (A) 03/22/2023 0426   HGBUR LARGE (A) 03/22/2023 0426    BILIRUBINUR NEGATIVE 03/22/2023 0426   KETONESUR NEGATIVE 03/22/2023 0426   PROTEINUR >=300 (A) 03/22/2023 0426   UROBILINOGEN 0.2 09/05/2011 0558   NITRITE POSITIVE (A) 03/22/2023 0426   LEUKOCYTESUR MODERATE (A) 03/22/2023 0426   Sepsis Labs: @LABRCNTIP (procalcitonin:4,lacticidven:4)  ) No results found for this or any previous visit (from the past 240 hours).    Radiology Studies: No results found.   Scheduled Meds:  acetaZOLAMIDE  500 mg Oral BID   apixaban  5 mg Oral BID   atorvastatin  40 mg Oral QPM   clotrimazole  1 Application Topical BID   ezetimibe  10 mg Oral Daily   insulin aspart  0-15 Units Subcutaneous TID WC   insulin aspart  0-5 Units Subcutaneous QHS   insulin glargine  10 Units Subcutaneous QHS   levothyroxine  75 mcg Oral QAC breakfast   metolazone  10 mg Oral BID   mexiletine  250 mg Oral Q12H   polyethylene glycol  17 g Oral Daily   senna-docusate  1 tablet Oral BID   sodium chloride flush  10-40 mL Intracatheter Q12H   spironolactone  12.5 mg Oral Daily   Continuous Infusions:  furosemide (LASIX) 200 mg in dextrose 5 % 100 mL (2 mg/mL) infusion 30 mg/hr (10/08/23 0342)   milrinone 0.25 mcg/kg/min (10/08/23 0728)   promethazine (PHENERGAN) injection (IM or IVPB) 6.25 mg (10/08/23 0727)     LOS: 16 days    Time spent:    Zannie Cove, MD [facility]   10/08/2023, 9:13 AM

## 2023-10-09 DIAGNOSIS — I5023 Acute on chronic systolic (congestive) heart failure: Secondary | ICD-10-CM | POA: Diagnosis not present

## 2023-10-09 DIAGNOSIS — N179 Acute kidney failure, unspecified: Secondary | ICD-10-CM | POA: Diagnosis not present

## 2023-10-09 DIAGNOSIS — Z7189 Other specified counseling: Secondary | ICD-10-CM | POA: Diagnosis not present

## 2023-10-09 DIAGNOSIS — Z8673 Personal history of transient ischemic attack (TIA), and cerebral infarction without residual deficits: Secondary | ICD-10-CM | POA: Diagnosis not present

## 2023-10-09 DIAGNOSIS — N184 Chronic kidney disease, stage 4 (severe): Secondary | ICD-10-CM

## 2023-10-09 LAB — BASIC METABOLIC PANEL
Anion gap: 12 (ref 5–15)
BUN: 72 mg/dL — ABNORMAL HIGH (ref 6–20)
CO2: 25 mmol/L (ref 22–32)
Calcium: 8 mg/dL — ABNORMAL LOW (ref 8.9–10.3)
Chloride: 96 mmol/L — ABNORMAL LOW (ref 98–111)
Creatinine, Ser: 2.13 mg/dL — ABNORMAL HIGH (ref 0.44–1.00)
GFR, Estimated: 28 mL/min — ABNORMAL LOW (ref >60)
Glucose, Bld: 114 mg/dL — ABNORMAL HIGH (ref 70–99)
Potassium: 3.4 mmol/L — ABNORMAL LOW (ref 3.5–5.1)
Sodium: 133 mmol/L — ABNORMAL LOW (ref 135–145)

## 2023-10-09 LAB — COOXEMETRY PANEL
Carboxyhemoglobin: 1.8 % — ABNORMAL HIGH (ref 0.5–1.5)
Methemoglobin: 0.7 % (ref 0.0–1.5)
O2 Saturation: 63.8 %
Total hemoglobin: 10.5 g/dL — ABNORMAL LOW (ref 12.0–16.0)

## 2023-10-09 LAB — MAGNESIUM: Magnesium: 1.9 mg/dL (ref 1.7–2.4)

## 2023-10-09 LAB — GLUCOSE, CAPILLARY
Glucose-Capillary: 110 mg/dL — ABNORMAL HIGH (ref 70–99)
Glucose-Capillary: 115 mg/dL — ABNORMAL HIGH (ref 70–99)
Glucose-Capillary: 164 mg/dL — ABNORMAL HIGH (ref 70–99)
Glucose-Capillary: 136 mg/dL — ABNORMAL HIGH (ref 70–99)

## 2023-10-09 MED ORDER — SPIRONOLACTONE 25 MG PO TABS
25.0000 mg | ORAL_TABLET | Freq: Every day | ORAL | Status: DC
  Administered 2023-10-09 – 2023-10-13 (×5): 25 mg via ORAL
  Filled 2023-10-09 (×5): qty 1

## 2023-10-09 MED ORDER — MAGNESIUM SULFATE 2 GM/50ML IV SOLN
2.0000 g | Freq: Once | INTRAVENOUS | Status: AC
  Administered 2023-10-09: 2 g via INTRAVENOUS
  Filled 2023-10-09: qty 50

## 2023-10-09 MED ORDER — PROMETHAZINE HCL 25 MG PO TABS
12.5000 mg | ORAL_TABLET | Freq: Four times a day (QID) | ORAL | Status: AC | PRN
  Administered 2023-10-09 – 2023-10-10 (×2): 12.5 mg via ORAL
  Filled 2023-10-09 (×2): qty 1

## 2023-10-09 MED ORDER — POTASSIUM CHLORIDE CRYS ER 20 MEQ PO TBCR
60.0000 meq | EXTENDED_RELEASE_TABLET | ORAL | Status: AC
  Administered 2023-10-09 (×3): 60 meq via ORAL
  Filled 2023-10-09 (×3): qty 3

## 2023-10-09 NOTE — Progress Notes (Signed)
 This chaplain is present for F/U spiritual care in the setting of creating/updating the Pt. Advance Directive.  The Pt. is awake and settling in her bed.   The chaplain understands the Pt. will begin reading the AD document and continues to lean on her friend-Shel for processing the AD content. The Pt. requests a revisit after she has had time to process the AD education on her own.  This chaplain is available for F/U spiritual care as needed.  Chaplain Stephanie Acre [phone]

## 2023-10-09 NOTE — Progress Notes (Addendum)
 PROGRESS NOTE    Karen Bennett  OZH:[id number] DOB: October 09, 1975 DOA: 09/22/2023 PCP: [name], NP  47/F w obesity class 3, chronic systolic CHF, HTN, HLD, IDDM, hypothyroidism, prior DVT/PE, history of Candida mitral valve endocarditis in 2022, pulmonary hypertension, CKD 3B, right BKA, prior CVA, presented to ED with worsening abdominal distention, lower extremity swelling and shortness of breath.  -recently hospitalized and discharged in January 2025 for acute on chronic systolic CHF, required significant diuresis with milrinone.  -In the ED she was found to have volume overload, acute kidney injury, and hyperkalemia.  -placed on IV furosemide, and inotropic support with milrinone.  -3/07 transferred to [facility]  -3/9-14: Continued on high-dose diuretics -3/13: Palliative care meeting, wishes for full code, full scope -Remains on Lasix gtt. and milrinone  Subjective: -Feels fair, no events overnight  Assessment and Plan:  Acute on chronic systolic heart failure (HCC) BiV Failure Pulm HTN -Echo w/ EF 25 to 30%, severe LV cavity dilatation, RV moderately reduced, RVSP 78.2  -CHF team following, on Lasix gtt. 30 Mg per hour and milrinone  -Improved response to diuretics, at least 15.4 L negative, weight inaccurate -GDMT limited by AKI/CKD -poor SGLT2I candidate -Palliative consulted for goals of care, overall poor prognosis with limited support> patient wishes for full code and full scope -Adamantly declines SNF -Transition to oral diuretics in 1 to 2 days, maximize home health services and outpatient palliative care, high risk of quick readmission  Hypokalemia, hypomagnesemia -Replaced  Acute on chronic anemia -Prior anemia panel with iron deficiency, given IV iron 3/16  Frequent PVC,  -continue mexiletine, monitor lytes  AKI on CKD4 Hyponatremia, hypokalemia. (Base serum cr 2,0 -creat was 3.2 on admission, now improving on milrinone  History of pulmonary  embolism Continue apixaban.   Type 2 diabetes mellitus  -CBGs are stable on current dose of glargine  Hx of right BKA (HCC) PT and OT consulted, she uses a power chair at baseline and transfers to the toilet, declines SNF  Hypothyroidism Continue with levothyroxine   Morbid Obesity OSA/OHS -declined sleep study, needs significant wt loss -Attempt nighttime BiPAP  History of CVA (cerebrovascular accident) -Continue apixaban and statin  Anxiety and depression Continue with as needed hydroxyzine    DVT prophylaxis: apixaban Code Status: Full Code Family Communication: None present Disposition Plan: Home likely 48 hours  Consultants:    Procedures:   Antimicrobials:    Objective: Vitals:   10/08/23 2239 10/09/23 0425 10/09/23 0647 10/09/23 0717  BP: 104/83 116/63  (!) 99/58  Pulse: 100 90    Resp: 16 20  19   Temp: 97.7 F (36.5 C) 97.9 F (36.6 C)  97.7 F (36.5 C)  TempSrc: Oral Oral  Oral  SpO2: 96% 91%  92%  Weight:   (!) 192.4 kg   Height:        Intake/Output Summary (Last 24 hours) at 10/09/2023 1130 Last data filed at 10/09/2023 1121 Gross per 24 hour  Intake 1191.64 ml  Output 2150 ml  Net -958.36 ml   Filed Weights   10/08/23 0505 10/08/23 0804 10/09/23 0647  Weight: (!) 224 kg (!) 194.5 kg (!) 192.4 kg    Examination:  Gen: Obese chronically ill female sitting up in bed, AAOx3 HEENT: Neck obese unable to assess JVD CVS: S1-S2, regular rhythm Lungs: Clear bilaterally Abdomen: Soft, nontender, bowel sounds present Remedies: Trace edema in left upper thigh, Unna boots noted, right BKA  Skin: no new rashes on exposed skin   Data  Reviewed:   CBC: Recent Labs  Lab 10/07/23 0500 10/08/23 0400  WBC 4.1 4.6  HGB 8.9* 9.6*  HCT 28.7* 31.5*  MCV 86.7 87.5  PLT 253 294   Basic Metabolic Panel: Recent Labs  Lab 10/05/23 0410 10/05/23 1440 10/06/23 0355 10/06/23 1631 10/07/23 0500 10/08/23 0352 10/09/23 0500  NA 129*   < > 129*  131* 131* 132* 133*  K 2.8*   < > 3.4* 3.3* 3.5 3.6 3.4*  CL 91*   < > 94* 95* 94* 96* 96*  CO2 26   < > 24 26 26 28 25   GLUCOSE 154*   < > 191* 104* 163* 99 114*  BUN 77*   < > 73* 72* 76* 72* 72*  CREATININE 2.71*   < > 2.43* 2.29* 2.36* 2.15* 2.13*  CALCIUM 8.3*   < > 7.9* 7.9* 8.1* 8.0* 8.0*  MG 1.8  --  1.8  --  2.0 2.1 1.9   < > = values in this interval not displayed.   GFR: Estimated Creatinine Clearance: 58.6 mL/min (A) (by C-G formula based on SCr of 2.13 mg/dL (H)). Liver Function Tests: No results for input(s): "AST", "ALT", "ALKPHOS", "BILITOT", "PROT", "ALBUMIN" in the last 168 hours.  No results for input(s): "LIPASE", "AMYLASE" in the last 168 hours. No results for input(s): "AMMONIA" in the last 168 hours. Coagulation Profile: No results for input(s): "INR", "PROTIME" in the last 168 hours. Cardiac Enzymes: No results for input(s): "CKTOTAL", "CKMB", "CKMBINDEX", "TROPONINI" in the last 168 hours. BNP (last 3 results) No results for input(s): "PROBNP" in the last 8760 hours. HbA1C: No results for input(s): "HGBA1C" in the last 72 hours. CBG: Recent Labs  Lab 10/08/23 0557 10/08/23 1147 10/08/23 1653 10/08/23 2134 10/09/23 0635  GLUCAP 95 101* 111* 114* 110*   Lipid Profile: No results for input(s): "CHOL", "HDL", "LDLCALC", "TRIG", "CHOLHDL", "LDLDIRECT" in the last 72 hours. Thyroid Function Tests: No results for input(s): "TSH", "T4TOTAL", "FREET4", "T3FREE", "THYROIDAB" in the last 72 hours. Anemia Panel: No results for input(s): "VITAMINB12", "FOLATE", "FERRITIN", "TIBC", "IRON", "RETICCTPCT" in the last 72 hours. Urine analysis:    Component Value Date/Time   COLORURINE AMBER (A) 03/22/2023 0426   APPEARANCEUR TURBID (A) 03/22/2023 0426   LABSPEC 1.040 (H) 03/22/2023 0426   PHURINE 5.0 03/22/2023 0426   GLUCOSEU >=500 (A) 03/22/2023 0426   HGBUR LARGE (A) 03/22/2023 0426   BILIRUBINUR NEGATIVE 03/22/2023 0426   KETONESUR NEGATIVE 03/22/2023  0426   PROTEINUR >=300 (A) 03/22/2023 0426   UROBILINOGEN 0.2 09/05/2011 0558   NITRITE POSITIVE (A) 03/22/2023 0426   LEUKOCYTESUR MODERATE (A) 03/22/2023 0426   Sepsis Labs: @LABRCNTIP (procalcitonin:4,lacticidven:4)  ) No results found for this or any previous visit (from the past 240 hours).    Radiology Studies: No results found.   Scheduled Meds:  acetaZOLAMIDE  500 mg Oral BID   apixaban  5 mg Oral BID   atorvastatin  40 mg Oral QPM   clotrimazole  1 Application Topical BID   ezetimibe  10 mg Oral Daily   insulin aspart  0-15 Units Subcutaneous TID WC   insulin aspart  0-5 Units Subcutaneous QHS   levothyroxine  75 mcg Oral QAC breakfast   metolazone  10 mg Oral BID   mexiletine  250 mg Oral Q12H   polyethylene glycol  17 g Oral Daily   senna-docusate  1 tablet Oral BID   sodium chloride flush  10-40 mL Intracatheter Q12H   spironolactone  25  mg Oral Daily   Continuous Infusions:  furosemide (LASIX) 200 mg in dextrose 5 % 100 mL (2 mg/mL) infusion 30 mg/hr (10/09/23 0643)   milrinone 0.25 mcg/kg/min (10/09/23 1121)   promethazine (PHENERGAN) injection (IM or IVPB) 6.25 mg (10/09/23 0603)     LOS: 17 days    Time spent:    Zannie Cove, MD [facility]   10/09/2023, 11:30 AM

## 2023-10-09 NOTE — Progress Notes (Signed)
 OT Cancellation Note  Patient Details Name: Karen Bennett MRN: [id number] DOB: 14-Jul-1976   Cancelled Treatment:    Reason Eval/Treat Not Completed: Fatigue/lethargy limiting ability to participate Pt sleeping on entry and did not awaken to voice. Left UE HEP at bedside with plans to review on next OT attempt.  Lorre Munroe 10/09/2023, 12:38 PM

## 2023-10-09 NOTE — Progress Notes (Addendum)
 Daily Progress Note   Patient Name: Karen Bennett       Date: 10/09/2023 DOB: [birth year]  Age: 48 y.o. MRN#: [id number] Attending Physician: Zannie Cove, MD Primary Care Physician: [name], NP Admit Date: 09/22/2023  Reason for Consultation/Follow-up: Establishing goals of care  Patient Profile/HPI:  48 y.o. female  with past medical history of obesity, chronic systolic CHF, HTN, HLD, IDDM, hypothyroidism, prior DVT/PE, history of Candida mitral valve endocarditis in 2022, pulmonary hypertension, CKD 3B, right BKA, prior CV. She presented to [facility] ED 09/22/23 c/o worsening abdominal distention, lower extremity swelling and shortness of breath. ED workup found volume overload, AKI and hyperkalemia, admitted and placed on IV furosemide and inotropic support with milrinone. She was subsequently transferred to [facility] 09/28/23.    Palliative care consulted to discuss goals of care.   Subjective: Chart reviewed including labs, progress notes, imaging from this and previous encounters.   Noted per cardiology she continues to diurese- hoping for transition to po diuretic soon when CVP falls to around 10 - currently is 12-13.  Karen Bennett is awake, coloring. Feels ok, no complaints.  We discussed HCPOA- she did not know where the paperwork was- I located it on the bedside table. I encouraged her to complete at least HCPOA part.   ROS   Physical Exam Vitals and nursing note reviewed.  Pulmonary:     Effort: Pulmonary effort is normal.  Neurological:     Mental Status: She is alert and oriented to person, place, and time.             Vital Signs: BP (!) 99/58 (BP Location: Right Wrist)   Pulse 90   Temp 97.7 F (36.5 C) (Oral)   Resp 19   Ht 5\' 6"  (1.676 m)   Wt (!) 192.4 kg   LMP 04/13/2013    SpO2 92%   BMI 68.46 kg/m  SpO2: SpO2: 92 % O2 Device: O2 Device: Room Air O2 Flow Rate: O2 Flow Rate (L/min): 3 L/min  Intake/output summary:  Intake/Output Summary (Last 24 hours) at 10/09/2023 1038 Last data filed at 10/09/2023 0428 Gross per 24 hour  Intake 957.66 ml  Output 2150 ml  Net -1192.34 ml   LBM: Last BM Date : 10/08/23 Baseline Weight: Weight: (!) 163.3 kg Most recent weight: Weight: (!) 192.4 kg  Palliative Assessment/Data: PPS: 50%      Patient Active Problem List   Diagnosis Date Noted   Acute on chronic HFrEF (heart failure with reduced ejection fraction) (HCC) 09/22/2023   History of CVA (cerebrovascular accident) 08/15/2023   Acute diastolic (congestive) heart failure (HCC) 08/01/2023   Acute CVA (cerebrovascular accident) (HCC) 03/23/2023   Aphasia 03/21/2023   QT prolongation 03/21/2023   Paraplegia (HCC) 02/09/2023   Acute pulmonary embolism (HCC) 01/25/2023   Acute on chronic systolic heart failure (HCC) 01/11/2023   Chronic kidney disease, stage 3a (HCC) 01/11/2023   Acute pain of right lower extremity 07/04/2022   History of femur fracture 02/23/2022   Bacteremia 02/22/2022   Hypothyroidism    Hyperglycemia 10/24/2021   Hx of bacterial endocarditis 10/24/2021   Pyuria 10/24/2021   Candida rash of groin 09/11/2021   Obstruction of right ureteropelvic junction (UPJ) due to stone 09/04/2021   Stenosis of subclavian artery (HCC) 02/09/2021   Sepsis (HCC) 02/01/2021   Mixed hyperlipidemia 01/12/2021   Infective endocarditis    BMI 60.0-69.9, adult (HCC) 12/27/2020   Hypertension associated with diabetes (HCC) 12/27/2020   History of pulmonary embolism 12/27/2020   Necrotizing fasciitis of lower leg (HCC) Thigh  12/27/2020   Cellulitis 12/26/2020   History of DVT (deep vein thrombosis) 11/12/2020   Tobacco abuse 12/31/2017   Hx of right BKA (HCC) 12/31/2017   Closed fracture of right distal femur (HCC) 12/31/2017   Hirsutism  06/21/2016   Noncompliance with treatment plan 04/19/2015   Anxiety and depression 06/26/2013   Normocytic anemia 05/22/2013   Depression 05/22/2013   Grief reaction 05/22/2013   Acute kidney injury superimposed on stage 4 chronic kidney disease (HCC) 05/07/2013   Phantom limb pain (HCC) 05/06/2013   Type 2 diabetes mellitus with hyperlipidemia (HCC) 05/02/2013   Hypokalemia 05/01/2013   Diabetic peripheral neuropathy associated with type 2 diabetes mellitus (HCC) 04/30/2013   MSSA (methicillin susceptible Staphylococcus aureus) 09/06/2012   Foot osteomyelitis, left (HCC) 09/03/2012   Pulmonary emboli (HCC) 11/11/2011   Hyponatremia 09/05/2011   Diabetic foot ulcer (HCC) 09/05/2011   Insulin dependent type 2 diabetes mellitus (HCC) 06/12/2007    Palliative Care Assessment & Plan    Assessment/Recommendations/Plan  Continue current interventions Plan for d/c home with Palliative  Appreciate Spiritual Care support and assistance with Advanced Directives   Code Status:   Code Status: Full Code   Prognosis:  Unable to determine  Discharge Planning: Home with Home Health  Care plan was discussed with patient.   Thank you for allowing the Palliative Medicine Team to assist in the care of this patient.  Total time:  Prolonged billing:  Time includes:   Preparing to see the patient (e.g., review of tests) Obtaining and/or reviewing separately obtained history Performing a medically necessary appropriate examination and/or evaluation Counseling and educating the patient/family/caregiver Ordering medications, tests, or procedures Referring and communicating with other health care professionals (when not reported separately) Documenting clinical information in the electronic or other health record Independently interpreting results (not reported separately) and communicating results to the patient/family/caregiver Care coordination (not reported separately) Clinical  documentation  Ocie Bob, AGNP-C Palliative Medicine   Please contact Palliative Medicine Team phone at [phone] for questions and concerns.

## 2023-10-09 NOTE — Plan of Care (Signed)
   Problem: Education: Goal: Knowledge of General Education information will improve Description: Including pain rating scale, medication(s)/side effects and non-pharmacologic comfort measures Outcome: Progressing   Problem: Clinical Measurements: Goal: Ability to maintain clinical measurements within normal limits will improve Outcome: Progressing

## 2023-10-09 NOTE — Progress Notes (Addendum)
 Patient ID: Karen Bennett, female   DOB: 12-17-1975, 48 y.o.   MRN: [id number]     Advanced Heart Failure Rounding Note  Cardiologist: Chilton Si, MD  Chief Complaint: Heart Failure  Subjective:   Over the weekend continued to diurese with Lasix gtt 30 mg/hr, metolazone 10 bid, acetazolamide 500 bid.   Brisk diuresis over the last 24 hours.   Remains on milrinone 0.25, CO-OX stable.   Denies SOB. Asking when she can go home.   Objective:   Weight Range: (!) 192.4 kg Body mass index is 68.46 kg/m.   Vital Signs:   Temp:  [97.6 F (36.4 C)-98.7 F (37.1 C)] 97.7 F (36.5 C) (03/18 0717) Pulse Rate:  [90-100] 90 (03/18 0425) Resp:  [16-20] 19 (03/18 0717) BP: (99-116)/(56-83) 99/58 (03/18 0717) SpO2:  [91 %-96 %] 92 % (03/18 0717) Weight:  [192.4 kg-194.5 kg] 192.4 kg (03/18 0647) Last BM Date : 10/08/23  Weight change: Filed Weights   10/08/23 0505 10/08/23 0804 10/09/23 0647  Weight: (!) 224 kg (!) 194.5 kg (!) 192.4 kg    Intake/Output:   Intake/Output Summary (Last 24 hours) at 10/09/2023 0724 Last data filed at 10/09/2023 0428 Gross per 24 hour  Intake 967.66 ml  Output 3050 ml  Net -2082.34 ml    CVP  12-13  Physical Exam  General:  In bed.  No resp difficulty Neck: supple. JPV difficult to assess.   Cor: PMI nondisplaced. Regular rate & rhythm. No rubs, gallops or murmurs. Lungs: clear Abdomen: soft, nontender, nondistended.  Extremities: no cyanosis, clubbing, rash, edema. LLE unna boot. RBKA Neuro: alert & oriented x3  Telemetry   SR-ST 90-100s   EKG    No new EKG to review  Labs    CBC Recent Labs    10/07/23 0500 10/08/23 0400  WBC 4.1 4.6  HGB 8.9* 9.6*  HCT 28.7* 31.5*  MCV 86.7 87.5  PLT 253 294    Basic Metabolic Panel Recent Labs    91/47/82 0352 10/09/23 0500  NA 132* 133*  K 3.6 3.4*  CL 96* 96*  CO2 28 25  GLUCOSE 99 114*  BUN 72* 72*  CREATININE 2.15* 2.13*  CALCIUM 8.0* 8.0*  MG 2.1 1.9   Liver  Function Tests No results for input(s): "AST", "ALT", "ALKPHOS", "BILITOT", "PROT", "ALBUMIN" in the last 72 hours. No results for input(s): "LIPASE", "AMYLASE" in the last 72 hours. Cardiac Enzymes No results for input(s): "CKTOTAL", "CKMB", "CKMBINDEX", "TROPONINI" in the last 72 hours.  BNP: BNP (last 3 results) Recent Labs    02/09/23 1736 08/01/23 1140 09/22/23 0215  BNP 345.1* 523.3* 572.3*    ProBNP (last 3 results) No results for input(s): "PROBNP" in the last 8760 hours.   D-Dimer No results for input(s): "DDIMER" in the last 72 hours. Hemoglobin A1C No results for input(s): "HGBA1C" in the last 72 hours. Fasting Lipid Panel No results for input(s): "CHOL", "HDL", "LDLCALC", "TRIG", "CHOLHDL", "LDLDIRECT" in the last 72 hours. Thyroid Function Tests No results for input(s): "TSH", "T4TOTAL", "T3FREE", "THYROIDAB" in the last 72 hours.  Invalid input(s): "FREET3"  Other results:   Imaging    No results found.   Medications:     Scheduled Medications:  acetaZOLAMIDE  500 mg Oral BID   apixaban  5 mg Oral BID   atorvastatin  40 mg Oral QPM   clotrimazole  1 Application Topical BID   ezetimibe  10 mg Oral Daily   insulin aspart  0-15 Units  Subcutaneous TID WC   insulin aspart  0-5 Units Subcutaneous QHS   levothyroxine  75 mcg Oral QAC breakfast   metolazone  10 mg Oral BID   mexiletine  250 mg Oral Q12H   polyethylene glycol  17 g Oral Daily   senna-docusate  1 tablet Oral BID   sodium chloride flush  10-40 mL Intracatheter Q12H   spironolactone  12.5 mg Oral Daily    Infusions:  furosemide (LASIX) 200 mg in dextrose 5 % 100 mL (2 mg/mL) infusion 30 mg/hr (10/09/23 0643)   milrinone 0.25 mcg/kg/min (10/09/23 0533)   promethazine (PHENERGAN) injection (IM or IVPB) 6.25 mg (10/09/23 0603)    PRN Medications: acetaminophen **OR** acetaminophen, diphenhydrAMINE-zinc acetate, Gerhardt's butt cream, hydrOXYzine, mineral oil-hydrophilic petrolatum,  mouth rinse, oxyCODONE, promethazine (PHENERGAN) injection (IM or IVPB), sodium chloride flush    Patient Profile   [name] with CKD stage III, hypothyroidism, mitral valve endocarditis in 2022, history of CVA, right BKA, severe morbid obesity (BMI 70) and chronic biventricular failure currently admitted with heart failure exacerbation.   Assessment/Plan   1. A/C Biventricular HF EF continues to drop. Previous Echo EF 35-40% EF but now 25-30% + RV failure. Etiology unclear. ? PVC induced? She has not had ischemic work up due to weight/CKD.  Readmitted with marked volume overload. Poor response with intermittent IV lasix and IV bumex. Milrinone added to augment diuresis.  - remains on milrinone 0.25, CO-OX 55%.  - CVP 12-13. Continue aggressive diuresis.  Continue lasix gtt @30  mg w/ aggressive K supplementation. Continue metolazone 10 bid + Diamox 500 mg bid .  - GDMT limited by hypotension and AKI  - Increase spiro 25 mg daily.   - Will continue aggressive diuresis as long as creatinine tolerates, reassess tomorrow.  Probably can switch to po diuretics when CVP is around 10.    2. CKD  Stage IV on AKI  - SCr 3.2 on admit, b/l ~2.0  - SCr stable 2.1, back to baseline.  - continue milrinone + diuresis per above - follow BMET    3. Hypothyroidism  - Recent TSH stable.    4. PVCs  Continue mexiletine 250 every 12 hours.    5. H/O PE/DVT - Continue eliquis 5 mg bid.    6.Suspected OHS/OSA - Refuses sleep study   7. Severe Morbid Obesity.  DMII. HGb A1C 7.2 Consider GLP1 as an outpatient.    8. Right BKA   9. Hypokalemia - Supp K. -Increase spiro to 25 mg daily.    10. GOC Palliative Care following. Plan for outpatient follow up.   Disposition: Planning HH /[facility] for Palliative Care   Length of Stay: [address], NP  10/09/2023, 7:24 AM  Advanced Heart Failure Team Pager [phone] (M-F; 7a - 5p)  Please contact [facility] for night-coverage  after hours (5p -7a ) and weekends on [email or web address]   Patient seen and examined with the above-signed Advanced Practice Provider and/or Housestaff. I personally reviewed laboratory data, imaging studies and relevant notes. I independently examined the patient and formulated the important aspects of the plan. I have edited the note to reflect any of my changes or salient points. I have personally discussed the plan with the patient and/or family.  Continues to diurese on high-dose lasix gtt, metolazone and diamox. Weight down slightly.   Denies CP or SOB. Co-ox  marginal at 55%. CVP 12-13  Scr stable.   General:  Obese woman sitting up in bed  eating lunch No resp difficulty HEENT: normal Neck: supple. JVP to jaw  Carotids 2+ bilat; no bruits. No lymphadenopathy or thryomegaly appreciated. Cor:  Regular rate & rhythm. No rubs, gallops or murmurs. Lungs: clear Abdomen: obese  soft, nontender,Good bowel sounds. Extremities: no cyanosis, clubbing, rash, 1-2+ edema on L (+UNNA) s/p R BKA Neuro: alert & orientedx3, cranial nerves grossly intact. moves all 4 extremities w/o difficulty. Affect pleasant  Diuresing steadily on high-dose diuretics. Will continue milrinone. Increase lasix gtt to 40. Discussed need to limit fluid intake.   Watch Scr closely.   Arvilla Meres, MD  1:29 PM

## 2023-10-10 ENCOUNTER — Telehealth (HOSPITAL_COMMUNITY): Payer: Self-pay

## 2023-10-10 DIAGNOSIS — I5023 Acute on chronic systolic (congestive) heart failure: Secondary | ICD-10-CM | POA: Diagnosis not present

## 2023-10-10 DIAGNOSIS — Z7189 Other specified counseling: Secondary | ICD-10-CM | POA: Diagnosis not present

## 2023-10-10 DIAGNOSIS — N179 Acute kidney failure, unspecified: Secondary | ICD-10-CM | POA: Diagnosis not present

## 2023-10-10 DIAGNOSIS — Z8673 Personal history of transient ischemic attack (TIA), and cerebral infarction without residual deficits: Secondary | ICD-10-CM | POA: Diagnosis not present

## 2023-10-10 LAB — BASIC METABOLIC PANEL
Anion gap: 9 (ref 5–15)
BUN: 71 mg/dL — ABNORMAL HIGH (ref 6–20)
CO2: 26 mmol/L (ref 22–32)
Calcium: 8.3 mg/dL — ABNORMAL LOW (ref 8.9–10.3)
Chloride: 98 mmol/L (ref 98–111)
Creatinine, Ser: 2.22 mg/dL — ABNORMAL HIGH (ref 0.44–1.00)
GFR, Estimated: 27 mL/min — ABNORMAL LOW (ref >60)
Glucose, Bld: 148 mg/dL — ABNORMAL HIGH (ref 70–99)
Potassium: 4.3 mmol/L (ref 3.5–5.1)
Sodium: 133 mmol/L — ABNORMAL LOW (ref 135–145)

## 2023-10-10 LAB — GLUCOSE, CAPILLARY
Glucose-Capillary: 145 mg/dL — ABNORMAL HIGH (ref 70–99)
Glucose-Capillary: 133 mg/dL — ABNORMAL HIGH (ref 70–99)
Glucose-Capillary: 128 mg/dL — ABNORMAL HIGH (ref 70–99)
Glucose-Capillary: 114 mg/dL — ABNORMAL HIGH (ref 70–99)

## 2023-10-10 LAB — COOXEMETRY PANEL
Carboxyhemoglobin: 1.5 % (ref 0.5–1.5)
Methemoglobin: <0.7 % (ref 0.0–1.5)
O2 Saturation: 54.9 %
Total hemoglobin: 10.6 g/dL — ABNORMAL LOW (ref 12.0–16.0)

## 2023-10-10 LAB — MAGNESIUM: Magnesium: 2.2 mg/dL (ref 1.7–2.4)

## 2023-10-10 MED ORDER — PROCHLORPERAZINE EDISYLATE 10 MG/2ML IJ SOLN
5.0000 mg | Freq: Once | INTRAMUSCULAR | Status: AC
  Administered 2023-10-10: 5 mg via INTRAVENOUS
  Filled 2023-10-10: qty 1

## 2023-10-10 MED ORDER — LOPERAMIDE HCL 2 MG PO CAPS
2.0000 mg | ORAL_CAPSULE | Freq: Once | ORAL | Status: AC
  Administered 2023-10-10: 2 mg via ORAL
  Filled 2023-10-10: qty 1

## 2023-10-10 MED ORDER — POTASSIUM CHLORIDE CRYS ER 20 MEQ PO TBCR
60.0000 meq | EXTENDED_RELEASE_TABLET | Freq: Once | ORAL | Status: AC
  Administered 2023-10-10: 60 meq via ORAL
  Filled 2023-10-10: qty 3

## 2023-10-10 NOTE — Progress Notes (Signed)
 Orthopedic Tech Progress Note Patient Details:  Karen Bennett 15-Sep-1975 [id number]  Ortho Devices Type of Ortho Device: Radio broadcast assistant Ortho Device/Splint Location: LLE Ortho Device/Splint Interventions: Application   Post Interventions Patient Tolerated: Well Instructions Provided: Care of device  Tisheena Maguire E Ersa Delaney 10/10/2023, 10:43 AM

## 2023-10-10 NOTE — Progress Notes (Addendum)
 Patient ID: Karen Bennett, female   DOB: 22-Jun-1976, 48 y.o.   MRN: [id number]     Advanced Heart Failure Rounding Note  Cardiologist: Chilton Si, MD  Chief Complaint: Fatigue Subjective:   3-16 Over the weekend continued to diurese with Lasix gtt 30 mg/hr, metolazone 10 bid, acetazolamide 500 bid.  3/18 Lasix drip increased to 40 mg per hour.   Sluggish diuresis noted.   Remains on milrinone 0.25, CO-OX 55%.   Complaining of fatigue.   Objective:   Weight Range: (!) 193.2 kg Body mass index is 68.75 kg/m.   Vital Signs:   Temp:  [97.6 F (36.4 C)-98.6 F (37 C)] 98.6 F (37 C) (03/19 0913) Pulse Rate:  [103] 103 (03/19 0358) Resp:  [17-20] 20 (03/19 0913) BP: (98-119)/(53-76) 113/67 (03/19 0913) SpO2:  [92 %-95 %] 94 % (03/19 0913) Weight:  [193.2 kg] 193.2 kg (03/19 0500) Last BM Date : 10/10/23  Weight change: Filed Weights   10/08/23 0804 10/09/23 0647 10/10/23 0500  Weight: (!) 194.5 kg (!) 192.4 kg (!) 193.2 kg    Intake/Output:   Intake/Output Summary (Last 24 hours) at 10/10/2023 1111 Last data filed at 10/10/2023 0956 Gross per 24 hour  Intake 2450.72 ml  Output 1525 ml  Net 925.72 ml    CVP  11-12  Physical Exam  General:   No resp difficulty Neck: supple. JVP 11-12 .  Cor: PMI nondisplaced. Regular rate & rhythm. No rubs, gallops or murmurs. Lungs: clear Abdomen: soft, nontender, nondistended. No hepatosplenomegaly. No bruits or masses. Good bowel sounds. Extremities: no cyanosis, clubbing, rash, edema. LLE unna boot. RBKA  Neuro: alert & orientedx3, cranial nerves grossly intact. moves all 4 extremities w/o difficulty. Affect pleasant   Telemetry   SR-ST 90-100s   EKG    No new EKG to review  Labs    CBC Recent Labs    10/08/23 0400  WBC 4.6  HGB 9.6*  HCT 31.5*  MCV 87.5  PLT 294    Basic Metabolic Panel Recent Labs    40/10/27 0500 10/10/23 0400  NA 133* 133*  K 3.4* 4.3  CL 96* 98  CO2 25 26  GLUCOSE 114*  148*  BUN 72* 71*  CREATININE 2.13* 2.22*  CALCIUM 8.0* 8.3*  MG 1.9 2.2   Liver Function Tests No results for input(s): "AST", "ALT", "ALKPHOS", "BILITOT", "PROT", "ALBUMIN" in the last 72 hours. No results for input(s): "LIPASE", "AMYLASE" in the last 72 hours. Cardiac Enzymes No results for input(s): "CKTOTAL", "CKMB", "CKMBINDEX", "TROPONINI" in the last 72 hours.  BNP: BNP (last 3 results) Recent Labs    02/09/23 1736 08/01/23 1140 09/22/23 0215  BNP 345.1* 523.3* 572.3*    ProBNP (last 3 results) No results for input(s): "PROBNP" in the last 8760 hours.   D-Dimer No results for input(s): "DDIMER" in the last 72 hours. Hemoglobin A1C No results for input(s): "HGBA1C" in the last 72 hours. Fasting Lipid Panel No results for input(s): "CHOL", "HDL", "LDLCALC", "TRIG", "CHOLHDL", "LDLDIRECT" in the last 72 hours. Thyroid Function Tests No results for input(s): "TSH", "T4TOTAL", "T3FREE", "THYROIDAB" in the last 72 hours.  Invalid input(s): "FREET3"  Other results:   Imaging    No results found.   Medications:     Scheduled Medications:  acetaZOLAMIDE  500 mg Oral BID   apixaban  5 mg Oral BID   atorvastatin  40 mg Oral QPM   clotrimazole  1 Application Topical BID   ezetimibe  10 mg  Oral Daily   insulin aspart  0-15 Units Subcutaneous TID WC   insulin aspart  0-5 Units Subcutaneous QHS   levothyroxine  75 mcg Oral QAC breakfast   metolazone  10 mg Oral BID   mexiletine  250 mg Oral Q12H   polyethylene glycol  17 g Oral Daily   senna-docusate  1 tablet Oral BID   sodium chloride flush  10-40 mL Intracatheter Q12H   spironolactone  25 mg Oral Daily    Infusions:  furosemide (LASIX) 200 mg in dextrose 5 % 100 mL (2 mg/mL) infusion 40 mg/hr (10/10/23 0743)   milrinone 0.25 mcg/kg/min (10/10/23 0751)    PRN Medications: acetaminophen **OR** acetaminophen, diphenhydrAMINE-zinc acetate, Gerhardt's butt cream, hydrOXYzine, mineral oil-hydrophilic  petrolatum, mouth rinse, oxyCODONE, sodium chloride flush    Patient Profile   [name] with CKD stage III, hypothyroidism, mitral valve endocarditis in 2022, history of CVA, right BKA, severe morbid obesity (BMI 70) and chronic biventricular failure currently admitted with heart failure exacerbation.   Assessment/Plan   1. A/C Biventricular HF EF continues to drop. Previous Echo EF 35-40% EF but now 25-30% + RV failure. Etiology unclear. ? PVC induced? She has not had ischemic work up due to weight/CKD.  Readmitted with marked volume overload. Poor response with intermittent IV lasix and IV bumex. Milrinone added to augment diuresis.  - remains on milrinone 0.25, CO-OX 55%.  - CVP 11-12.  Continue aggressive diuresis.  Continue lasix gtt @40  mg w/ aggressive K supplementation. Continue metolazone 10 bid + Diamox 500 mg bid .  - GDMT limited by hypotension and AKI  - Continue spiro 25 mg daily.   - Will continue aggressive diuresis as long as creatinine tolerates, reassess tomorrow.  Probably can switch to po diuretics when CVP is around 10.    2. CKD  Stage IV on AKI  - SCr 3.2 on admit, b/l ~2.0  - SCr stable 2.2, back to baseline.  - continue milrinone + diuresis per above - follow BMET    3. Hypothyroidism  - Recent TSH stable.    4. PVCs  Continue mexiletine 250 every 12 hours.    5. H/O PE/DVT - Continue eliquis 5 mg bid.    6.Suspected OHS/OSA - Refuses sleep study   7. Severe Morbid Obesity.  DMII. HGb A1C 7.2 Consider GLP1 as an outpatient.    8. Right BKA   9. Hypokalemia - K stable.  -Continue  spiro to 25 mg daily.    10. GOC Palliative Care following. Plan for outpatient follow up.   Disposition: Planning HH /[facility] for Palliative Care   Length of Stay: 57  Tonye Becket, NP  10/10/2023, 11:11 AM  Advanced Heart Failure Team Pager [phone] (M-F; 7a - 5p)  Please contact [facility] for night-coverage after hours (5p -7a ) and  weekends on [email or web address]  Patient seen and examined with the above-signed Advanced Practice Provider and/or Housestaff. I personally reviewed laboratory data, imaging studies and relevant notes. I independently examined the patient and formulated the important aspects of the plan. I have edited the note to reflect any of my changes or salient points. I have personally discussed the plan with the patient and/or family.  Remains on milrinone and high-dose lasix gtt (40/hr) + metolazone and diamox. Co-ox marginal at 55%.   Moderate diuresis. Weight up a pound. CVP 11-12  General:  Sitting up in bed No resp difficulty HEENT: normal Neck: supple. JVP up  Carotids 2+ bilat;  no bruits. No lymphadenopathy or thryomegaly appreciated. Cor: RRR Lungs: clear Abdomen: oese soft, nontender, nondistended. No hepatosplenomegaly. No bruits or masses. Good bowel sounds. Extremities: no cyanosis, clubbing, rash, 1+ edema + UNNA on left s/p R BKA  Neuro: alert & orientedx3, cranial nerves grossly intact. moves all 4 extremities w/o difficulty. Affect pleasant  Remains tenuous. Diuresis sluggish despite aggressive attempts and inotrope support. Will aim to get CVP < 10 prior to d/c   Arvilla Meres, MD  3:05 PM .

## 2023-10-10 NOTE — Telephone Encounter (Signed)
 Called to confirm/remind patient of their appointment at the [facility] on 10/11/23. However, pt is currently admitted into the hospital.

## 2023-10-10 NOTE — Progress Notes (Signed)
 PT Cancellation Note  Patient Details Name: Karen Bennett MRN: [id number] DOB: 14-Jun-1976   Cancelled Treatment:    Reason Eval/Treat Not Completed: Patient not medically ready  Patient reports recent nausea with vomiting and just got some nausea medicine. Does not feel she can do anything with PT at this time. OT may attempt to see later today as their schedule permits.    Jerolyn Center, PT Acute Rehabilitation Services  Office [phone]  Zena Amos 10/10/2023, 12:25 PM

## 2023-10-10 NOTE — Progress Notes (Signed)
 OT Cancellation Note  Patient Details Name: Karen Bennett MRN: [id number] DOB: 03/21/1976   Cancelled Treatment:    Reason Eval/Treat Not Completed: Medical issues which prohibited therapy (Pt declining participation in skilled OT treatment at this time due to nausea, vomiting, and diarrhea. RN reports pt has been given anti-nausea medication. OT to reattempt to see pt at a later time as appropriate/available.)  Marjorie Lussier "Ronaldo Miyamoto" M., OTR/L, MA Acute Rehab [phone]  Lendon Colonel 10/10/2023, 2:24 PM

## 2023-10-10 NOTE — Progress Notes (Addendum)
 PROGRESS NOTE    Karen Bennett  AOZ:[id number] DOB: [birth year] DOA: 09/22/2023 PCP: [name], NP  47/F w morbid obesity, chronic systolic CHF, HTN, HLD, IDDM, hypothyroidism, prior DVT/PE, history of Candida mitral valve endocarditis in 2022, pulmonary hypertension, CKD 3B, right BKA, prior CVA, presented to ED with worsening abdominal distention, lower extremity swelling and shortness of breath.  -recently hospitalized and discharged in January 2025 for systolic CHF, required diuresis with milrinone.  -In the ED she was found to have significant volume overload, acute kidney injury, and hyperkalemia.  -placed on IV furosemide, and inotropic support with milrinone.  -3/07 transferred to [facility] -3/9-14: Continued on high-dose diuretics -3/13: Palliative care meeting, wishes for full code, full scope -Remains on Lasix gtt. and milrinone, CVP remains elevated  Subjective: -Some diarrhea overnight, otherwise feels okay, denies any abdominal pain, no nausea or vomiting  Assessment and Plan:  Acute on chronic systolic heart failure (HCC) BiV Failure Pulm HTN -Echo w/ EF 25 to 30%, severe LV cavity dilatation, RV moderately reduced, RVSP 78.2  -CHF team following, on Lasix gtt. 40 Mg per hour and milrinone  -Improved response to diuretics, at least 14 L negative, bed weights inaccurate -GDMT limited by AKI/CKD -poor SGLT2I candidate -Palliative consulted for goals of care, overall poor prognosis with limited support> patient wishes for full code and full scope -Adamantly declines SNF -Fluid restriction, transition to oral diuretics soon, maximize home health services and outpatient palliative care, high risk of quick readmission -remove foley when she's off lasix gtt  Hypokalemia, hypomagnesemia -Replaced  Acute on chronic anemia -Prior anemia panel with iron deficiency, given IV iron 3/16  Frequent PVC,  -continue mexiletine, monitor lytes  AKI on CKD4 Hyponatremia,  hypokalemia. (Base serum cr 2,0 -creat was 3.2 on admission, now improving on milrinone  History of pulmonary embolism Continue apixaban.   Type 2 diabetes mellitus  -CBGs are stable on current dose of glargine  Hx of right BKA (HCC) PT and OT consulted, lives alone, uses a power chair at baseline and transfers to the toilet, adamantly declines SNF, she has a home health aide  Hypothyroidism Continue with levothyroxine   Morbid Obesity OSA/OHS -declined sleep study, needs significant wt loss -Attempt nighttime BiPAP  History of CVA (cerebrovascular accident) -Continue apixaban and statin  Anxiety and depression Continue with as needed hydroxyzine    DVT prophylaxis: apixaban Code Status: Full Code Family Communication: None present Disposition Plan: Home likely 2-3days  Consultants: Cards    Objective: Vitals:   10/09/23 2335 10/10/23 0358 10/10/23 0500 10/10/23 0913  BP: 114/65 119/76  113/67  Pulse:  (!) 103    Resp: 20 17  20   Temp: 97.6 F (36.4 C) 98.2 F (36.8 C)  98.6 F (37 C)  TempSrc: Oral Oral  Oral  SpO2: 95% 94%  94%  Weight:   (!) 193.2 kg   Height:        Intake/Output Summary (Last 24 hours) at 10/10/2023 1010 Last data filed at 10/10/2023 9629 Gross per 24 hour  Intake 2560.69 ml  Output 1525 ml  Net 1035.69 ml   Filed Weights   10/08/23 0804 10/09/23 0647 10/10/23 0500  Weight: (!) 194.5 kg (!) 192.4 kg (!) 193.2 kg    Examination:  Gen: Morbidly obese chronically ill female sitting up in bed, AAOx3 HEENT: Neck obese unable to assess JVD CVS: S1-S2, regular rhythm Lungs: Distant breath sounds otherwise clear Abdomen: Soft, obese, nontender, bowel sounds present Extremities: Trace edema in  left upper thigh, right BKA GU: Foley catheter Skin: no new rashes on exposed skin   Data Reviewed:   CBC: Recent Labs  Lab 10/07/23 0500 10/08/23 0400  WBC 4.1 4.6  HGB 8.9* 9.6*  HCT 28.7* 31.5*  MCV 86.7 87.5  PLT 253 294    Basic Metabolic Panel: Recent Labs  Lab 10/06/23 0355 10/06/23 1631 10/07/23 0500 10/08/23 0352 10/09/23 0500 10/10/23 0400  NA 129* 131* 131* 132* 133* 133*  K 3.4* 3.3* 3.5 3.6 3.4* 4.3  CL 94* 95* 94* 96* 96* 98  CO2 24 26 26 28 25 26   GLUCOSE 191* 104* 163* 99 114* 148*  BUN 73* 72* 76* 72* 72* 71*  CREATININE 2.43* 2.29* 2.36* 2.15* 2.13* 2.22*  CALCIUM 7.9* 7.9* 8.1* 8.0* 8.0* 8.3*  MG 1.8  --  2.0 2.1 1.9 2.2   GFR: Estimated Creatinine Clearance: 56.4 mL/min (A) (by C-G formula based on SCr of 2.22 mg/dL (H)). Liver Function Tests: No results for input(s): "AST", "ALT", "ALKPHOS", "BILITOT", "PROT", "ALBUMIN" in the last 168 hours.  No results for input(s): "LIPASE", "AMYLASE" in the last 168 hours. No results for input(s): "AMMONIA" in the last 168 hours. Coagulation Profile: No results for input(s): "INR", "PROTIME" in the last 168 hours. Cardiac Enzymes: No results for input(s): "CKTOTAL", "CKMB", "CKMBINDEX", "TROPONINI" in the last 168 hours. BNP (last 3 results) No results for input(s): "PROBNP" in the last 8760 hours. HbA1C: No results for input(s): "HGBA1C" in the last 72 hours. CBG: Recent Labs  Lab 10/09/23 0635 10/09/23 1143 10/09/23 1640 10/09/23 2120 10/10/23 0619  GLUCAP 110* 115* 164* 136* 145*   Lipid Profile: No results for input(s): "CHOL", "HDL", "LDLCALC", "TRIG", "CHOLHDL", "LDLDIRECT" in the last 72 hours. Thyroid Function Tests: No results for input(s): "TSH", "T4TOTAL", "FREET4", "T3FREE", "THYROIDAB" in the last 72 hours. Anemia Panel: No results for input(s): "VITAMINB12", "FOLATE", "FERRITIN", "TIBC", "IRON", "RETICCTPCT" in the last 72 hours. Urine analysis:    Component Value Date/Time   COLORURINE AMBER (A) 03/22/2023 0426   APPEARANCEUR TURBID (A) 03/22/2023 0426   LABSPEC 1.040 (H) 03/22/2023 0426   PHURINE 5.0 03/22/2023 0426   GLUCOSEU >=500 (A) 03/22/2023 0426   HGBUR LARGE (A) 03/22/2023 0426   BILIRUBINUR  NEGATIVE 03/22/2023 0426   KETONESUR NEGATIVE 03/22/2023 0426   PROTEINUR >=300 (A) 03/22/2023 0426   UROBILINOGEN 0.2 09/05/2011 0558   NITRITE POSITIVE (A) 03/22/2023 0426   LEUKOCYTESUR MODERATE (A) 03/22/2023 0426   Sepsis Labs: @LABRCNTIP (procalcitonin:4,lacticidven:4)  ) No results found for this or any previous visit (from the past 240 hours).    Radiology Studies: No results found.   Scheduled Meds:  acetaZOLAMIDE  500 mg Oral BID   apixaban  5 mg Oral BID   atorvastatin  40 mg Oral QPM   clotrimazole  1 Application Topical BID   ezetimibe  10 mg Oral Daily   insulin aspart  0-15 Units Subcutaneous TID WC   insulin aspart  0-5 Units Subcutaneous QHS   levothyroxine  75 mcg Oral QAC breakfast   loperamide  2 mg Oral Once   metolazone  10 mg Oral BID   mexiletine  250 mg Oral Q12H   polyethylene glycol  17 g Oral Daily   senna-docusate  1 tablet Oral BID   sodium chloride flush  10-40 mL Intracatheter Q12H   spironolactone  25 mg Oral Daily   Continuous Infusions:  furosemide (LASIX) 200 mg in dextrose 5 % 100 mL (2 mg/mL) infusion 40  mg/hr (10/10/23 0743)   milrinone 0.25 mcg/kg/min (10/10/23 0751)     LOS: 18 days    Time spent:    Zannie Cove, MD [facility]   10/10/2023, 10:10 AM

## 2023-10-11 ENCOUNTER — Encounter (HOSPITAL_COMMUNITY)

## 2023-10-11 DIAGNOSIS — E871 Hypo-osmolality and hyponatremia: Secondary | ICD-10-CM | POA: Diagnosis not present

## 2023-10-11 DIAGNOSIS — R197 Diarrhea, unspecified: Secondary | ICD-10-CM | POA: Diagnosis not present

## 2023-10-11 DIAGNOSIS — I5023 Acute on chronic systolic (congestive) heart failure: Secondary | ICD-10-CM | POA: Diagnosis not present

## 2023-10-11 LAB — BASIC METABOLIC PANEL
Anion gap: 10 (ref 5–15)
BUN: 72 mg/dL — ABNORMAL HIGH (ref 6–20)
CO2: 27 mmol/L (ref 22–32)
Calcium: 8.3 mg/dL — ABNORMAL LOW (ref 8.9–10.3)
Chloride: 92 mmol/L — ABNORMAL LOW (ref 98–111)
Creatinine, Ser: 2.23 mg/dL — ABNORMAL HIGH (ref 0.44–1.00)
GFR, Estimated: 27 mL/min — ABNORMAL LOW (ref >60)
Glucose, Bld: 198 mg/dL — ABNORMAL HIGH (ref 70–99)
Potassium: 3.5 mmol/L (ref 3.5–5.1)
Sodium: 129 mmol/L — ABNORMAL LOW (ref 135–145)

## 2023-10-11 LAB — GLUCOSE, CAPILLARY
Glucose-Capillary: 127 mg/dL — ABNORMAL HIGH (ref 70–99)
Glucose-Capillary: 125 mg/dL — ABNORMAL HIGH (ref 70–99)
Glucose-Capillary: 117 mg/dL — ABNORMAL HIGH (ref 70–99)
Glucose-Capillary: 127 mg/dL — ABNORMAL HIGH (ref 70–99)

## 2023-10-11 LAB — COOXEMETRY PANEL
Carboxyhemoglobin: 1.7 % — ABNORMAL HIGH (ref 0.5–1.5)
Methemoglobin: <0.7 % (ref 0.0–1.5)
O2 Saturation: 71.2 %
Total hemoglobin: 10 g/dL — ABNORMAL LOW (ref 12.0–16.0)

## 2023-10-11 LAB — MAGNESIUM: Magnesium: 2.1 mg/dL (ref 1.7–2.4)

## 2023-10-11 MED ORDER — LOPERAMIDE HCL 2 MG PO CAPS
4.0000 mg | ORAL_CAPSULE | Freq: Three times a day (TID) | ORAL | Status: DC | PRN
  Administered 2023-10-11: 4 mg via ORAL
  Filled 2023-10-11: qty 2

## 2023-10-11 MED ORDER — TORSEMIDE 20 MG PO TABS
100.0000 mg | ORAL_TABLET | Freq: Two times a day (BID) | ORAL | Status: DC
  Administered 2023-10-11 – 2023-10-13 (×4): 100 mg via ORAL
  Filled 2023-10-11 (×4): qty 5

## 2023-10-11 MED ORDER — ALUM & MAG HYDROXIDE-SIMETH 200-200-20 MG/5ML PO SUSP
15.0000 mL | ORAL | Status: DC | PRN
  Administered 2023-10-11 (×2): 15 mL via ORAL
  Filled 2023-10-11 (×2): qty 30

## 2023-10-11 MED ORDER — POTASSIUM CHLORIDE CRYS ER 20 MEQ PO TBCR
60.0000 meq | EXTENDED_RELEASE_TABLET | ORAL | Status: AC
  Administered 2023-10-11 (×2): 60 meq via ORAL
  Filled 2023-10-11 (×2): qty 3

## 2023-10-11 MED ORDER — LOPERAMIDE HCL 2 MG PO CAPS
4.0000 mg | ORAL_CAPSULE | Freq: Three times a day (TID) | ORAL | Status: AC
  Administered 2023-10-11 (×2): 4 mg via ORAL
  Filled 2023-10-11 (×2): qty 2

## 2023-10-11 NOTE — Progress Notes (Signed)
 Physical Therapy Treatment Patient Details Name: Karen Bennett MRN: [id number] DOB: 1976/06/05 Today's Date: 10/11/2023   History of Present Illness Pt is a 48 y.o. female admitted to [facility] from [facility] on 09/28/23 with LE edema, SOB, and abdominal distention. Chest x-ray showed CHF w/ layering B pleural effusions. Admitted w/ acute on chronic HFrEF, AKI, and hyperkalemia. Prior admit 1/8-1/28 for CHF exacerbation requiring aggressive diuresis and inotropic support. PMH significant for DM2, HTN, HLD, hypothyroidism, CKD IIIa, morbid obesity, Rt BKA, PE on Xarelto, DVT, endocarditis, cellulitis, lumbar radiculopathy, anxiety, depression, CVA.    PT Comments  Pt admitted with above diagnosis. PT somewhat self limiting as she would only agree to long sit on bed and perform sitting balance and UE exercises.  Pt definitely has more difficulty sitting in long sitting and performing UE exericises with theraband.  Will continue to work with pt as she will allow.  Pt currently with functional limitations due to the deficits listed below (see PT Problem List). Pt will benefit from acute skilled PT to increase their independence and safety with mobility to allow discharge.       If plan is discharge home, recommend the following: Assistance with cooking/housework;Assist for transportation;A little help with walking and/or transfers;A lot of help with bathing/dressing/bathroom   Can travel by private vehicle        Equipment Recommendations  None recommended by PT    Recommendations for Other Services       Precautions / Restrictions Precautions Precautions: Fall;Other (comment) Precaution/Restrictions Comments: h/o R BKA, pt reports is NWB due to spinter femur fx in 2018 Restrictions Weight Bearing Restrictions Per Provider Order: No RLE Weight Bearing Per Provider Order: Non weight bearing     Mobility  Bed Mobility         Supine to sit: Independent (to come to long sitting in the bed) Sit to  supine: Independent (from long sit in bed)        Transfers                        Ambulation/Gait                   Stairs             Wheelchair Mobility     Tilt Bed    Modified Rankin (Stroke Patients Only)       Balance Overall balance assessment: Independent (in sitting) Sitting-balance support: No upper extremity supported, Feet supported (in long sitting in bed) Sitting balance-Leahy Scale: Good Sitting balance - Comments: able to maintain balance in long sitting without hand support briefly. Pt able to work core while exercising in long sitting in bed.                                    Communication Communication Communication: No apparent difficulties  Cognition Arousal: Alert Behavior During Therapy: WFL for tasks assessed/performed   PT - Cognitive impairments: No apparent impairments                         Following commands: Intact      Cueing Cueing Techniques: Verbal cues  Exercises General Exercises - Upper Extremity Shoulder Flexion: AROM, Strengthening, Both, 5 reps, Seated (long sitting in bed; increased activity tolerance) General Exercises - Lower Extremity Ankle Circles/Pumps: AROM, Left, 10 reps, Supine Quad  Sets: AROM, Left, 10 reps, Seated Gluteal Sets: AROM, Both, Seated, 15 reps    General Comments        Pertinent Vitals/Pain Pain Assessment Pain Assessment: No/denies pain    Home Living                          Prior Function            PT Goals (current goals can now be found in the care plan section) Progress towards PT goals: Progressing toward goals    Frequency    Min 2X/week      PT Plan      Co-evaluation PT/OT/SLP Co-Evaluation/Treatment: Yes Reason for Co-Treatment: Complexity of the patient's impairments (multi-system involvement);For patient/therapist safety PT goals addressed during session: Mobility/safety with mobility (somwhat  dovetailed session overlapping a few min)        AM-PAC PT "6 Clicks" Mobility   Outcome Measure  Help needed turning from your back to your side while in a flat bed without using bedrails?: None Help needed moving from lying on your back to sitting on the side of a flat bed without using bedrails?: None Help needed moving to and from a bed to a chair (including a wheelchair)?: A Little Help needed standing up from a chair using your arms (e.g., wheelchair or bedside chair)?: Total Help needed to walk in hospital room?: Total Help needed climbing 3-5 steps with a railing? : Total 6 Click Score: 14    End of Session Equipment Utilized During Treatment: Oxygen Activity Tolerance:  (self limiting due to diarrhea) Patient left: in bed;with call bell/phone within reach Nurse Communication: Mobility status PT Visit Diagnosis: Other abnormalities of gait and mobility (R26.89);Muscle weakness (generalized) (M62.81)     Time: 1610-9604 PT Time Calculation (min) (ACUTE ONLY): 14 min  Charges:    $Therapeutic Exercise: 8-22 mins PT General Charges $$ ACUTE PT VISIT: 1 Visit                     Evany Schecter M,PT Acute Rehab Services [phone]    Bevelyn Buckles 10/11/2023, 12:45 PM

## 2023-10-11 NOTE — Progress Notes (Signed)
 Patient ID: Karen Bennett, female   DOB: 07-26-1975, 48 y.o.   MRN: [id number]     Advanced Heart Failure Rounding Note  Cardiologist: Chilton Si, MD  Chief Complaint: Fatigue Subjective:   3-16 Over the weekend continued to diurese with Lasix gtt 30 mg/hr, metolazone 10 bid, acetazolamide 500 bid.  3/18 Lasix drip increased to 40 mg per hour.   Remains on milrinone 0.25, CO-OX 55%.   Diuresis remains sluggish. Weight unchanged.   Denies SOB.   Objective:   Weight Range: (!) 193.1 kg Body mass index is 68.71 kg/m.   Vital Signs:   Temp:  [97.9 F (36.6 C)-98.7 F (37.1 C)] 98.6 F (37 C) (03/20 0735) Pulse Rate:  [94-100] 94 (03/20 0735) Resp:  [16-20] 20 (03/20 0304) BP: (102-120)/(55-74) 114/60 (03/20 0735) SpO2:  [92 %-97 %] 92 % (03/20 0735) Weight:  [193.1 kg] 193.1 kg (03/20 0304) Last BM Date : 10/10/23  Weight change: Filed Weights   10/09/23 0647 10/10/23 0500 10/11/23 0304  Weight: (!) 192.4 kg (!) 193.2 kg (!) 193.1 kg    Intake/Output:   Intake/Output Summary (Last 24 hours) at 10/11/2023 0739 Last data filed at 10/11/2023 0400 Gross per 24 hour  Intake 537.6 ml  Output 1875 ml  Net -1337.4 ml    CVP  12 Physical Exam  General:  No resp difficulty HEENT: normal Neck: supple. JVP difficult to assess due to body habitus. Carotids 2+ bilat; no bruits. No lymphadenopathy or thryomegaly appreciated. Cor: PMI nondisplaced. Regular rate & rhythm. No rubs, gallops or murmurs. Lungs: clear Abdomen: soft, nontender, nondistended. No hepatosplenomegaly. No bruits or masses. Good bowel sounds. Extremities: no cyanosis, clubbing, rash, edema. RBKA LLE unna boot Neuro: alert & orientedx3, cranial nerves grossly intact. moves all 4 extremities w/o difficulty. Affect pleasant   Telemetry   SR-ST 90-100s  EKG    No new EKG to review  Labs    CBC No results for input(s): "WBC", "NEUTROABS", "HGB", "HCT", "MCV", "PLT" in the last 72  hours.   Basic Metabolic Panel Recent Labs    40/10/27 0400 10/11/23 0315  NA 133* 129*  K 4.3 3.5  CL 98 92*  CO2 26 27  GLUCOSE 148* 198*  BUN 71* 72*  CREATININE 2.22* 2.23*  CALCIUM 8.3* 8.3*  MG 2.2 2.1   Liver Function Tests No results for input(s): "AST", "ALT", "ALKPHOS", "BILITOT", "PROT", "ALBUMIN" in the last 72 hours. No results for input(s): "LIPASE", "AMYLASE" in the last 72 hours. Cardiac Enzymes No results for input(s): "CKTOTAL", "CKMB", "CKMBINDEX", "TROPONINI" in the last 72 hours.  BNP: BNP (last 3 results) Recent Labs    02/09/23 1736 08/01/23 1140 09/22/23 0215  BNP 345.1* 523.3* 572.3*    ProBNP (last 3 results) No results for input(s): "PROBNP" in the last 8760 hours.   D-Dimer No results for input(s): "DDIMER" in the last 72 hours. Hemoglobin A1C No results for input(s): "HGBA1C" in the last 72 hours. Fasting Lipid Panel No results for input(s): "CHOL", "HDL", "LDLCALC", "TRIG", "CHOLHDL", "LDLDIRECT" in the last 72 hours. Thyroid Function Tests No results for input(s): "TSH", "T4TOTAL", "T3FREE", "THYROIDAB" in the last 72 hours.  Invalid input(s): "FREET3"  Other results:   Imaging    No results found.   Medications:     Scheduled Medications:  acetaZOLAMIDE  500 mg Oral BID   apixaban  5 mg Oral BID   atorvastatin  40 mg Oral QPM   clotrimazole  1 Application Topical BID  ezetimibe  10 mg Oral Daily   insulin aspart  0-15 Units Subcutaneous TID WC   insulin aspart  0-5 Units Subcutaneous QHS   levothyroxine  75 mcg Oral QAC breakfast   metolazone  10 mg Oral BID   mexiletine  250 mg Oral Q12H   polyethylene glycol  17 g Oral Daily   senna-docusate  1 tablet Oral BID   sodium chloride flush  10-40 mL Intracatheter Q12H   spironolactone  25 mg Oral Daily    Infusions:  furosemide (LASIX) 200 mg in dextrose 5 % 100 mL (2 mg/mL) infusion 40 mg/hr (10/11/23 0354)   milrinone 0.25 mcg/kg/min (10/11/23 0353)     PRN Medications: acetaminophen **OR** acetaminophen, alum & mag hydroxide-simeth, diphenhydrAMINE-zinc acetate, Gerhardt's butt cream, hydrOXYzine, mineral oil-hydrophilic petrolatum, mouth rinse, oxyCODONE, sodium chloride flush    Patient Profile   [name] with CKD stage III, hypothyroidism, mitral valve endocarditis in 2022, history of CVA, right BKA, severe morbid obesity (BMI 70) and chronic biventricular failure currently admitted with heart failure exacerbation.   Assessment/Plan   1. A/C Biventricular HF EF continues to drop. Previous Echo EF 35-40% EF but now 25-30% + RV failure. Etiology unclear. ? PVC induced? She has not had ischemic work up due to weight/CKD.  Readmitted with marked volume overload. Poor response with intermittent IV lasix and IV bumex. Milrinone added to augment diuresis.  - remains on milrinone 0.25, CO-OX 71% - CVP   Continue aggressive diuresis.  Continue lasix gtt @40  mg w/ aggressive K supplementation. Continue metolazone 10 bid + Diamox 500 mg bid .  - GDMT limited by hypotension and AKI  - Continue spiro 25 mg daily.   - Will continue aggressive diuresis as long as creatinine tolerates, reassess tomorrow.  Probably can switch to po diuretics when CVP is around 10.    2. CKD  Stage IV on AKI  - SCr 3.2 on admit, b/l ~2.0  - SCr stable 2.3, back to baseline.  - continue milrinone + diuresis per above - follow BMET    3. Hypothyroidism  - Recent TSH stable.    4. PVCs  Continue mexiletine 250 every 12 hours.    5. H/O PE/DVT - Continue eliquis 5 mg bid.    6.Suspected OHS/OSA - Refuses sleep study   7. Severe Morbid Obesity.  DMII. HGb A1C 7.2 Consider GLP1 as an outpatient.    8. Right BKA   9. Hypokalemia - Supp K  -Continue  spiro to 25 mg daily.    10. GOC Palliative Care following. Plan for outpatient follow up.   Disposition: Planning HH /[facility] for Palliative Care   Length of Stay: [address],  NP  10/11/2023, 7:39 AM  Advanced Heart Failure Team Pager [phone] (M-F; 7a - 5p)  Please contact [facility] for night-coverage after hours (5p -7a ) and weekends on [email or web address]  Patient seen and examined with the above-signed Advanced Practice Provider and/or Housestaff. I personally reviewed laboratory data, imaging studies and relevant notes. I independently examined the patient and formulated the important aspects of the plan. I have edited the note to reflect any of my changes or salient points. I have personally discussed the plan with the patient and/or family.  Remains on milrinone and high-dose diuretics. Weight unchanged. Co-ox 71% CVP 12  Denies CP or SOB. Drinking a bunch of fluid.   General:  Sitting up in bed No resp difficulty HEENT: normal Neck: supple. JVP hard  to see. Carotids 2+ bilat; no bruits. No lymphadenopathy or thryomegaly appreciated. Cor:  Regular rate & rhythm. No rubs, gallops or murmurs. Lungs: clear Abdomen: obese soft, nontender, Good bowel sounds. Extremities: no cyanosis, clubbing, rash, 1+ edema on left. S/p R BKA Neuro: alert & orientedx3, cranial nerves grossly intact. moves all 4 extremities w/o difficulty. Affect pleasant  I think this is as good as we can get her. Will stop lasix gtt today and switch to high dose oral diuretics. Begin milrinone wean tomorrow. If renal function worsening or develops refractory fluid overload will need to considr switch to Hospice care.   Arvilla Meres, MD  12:14 PM

## 2023-10-11 NOTE — Progress Notes (Signed)
 Occupational Therapy Treatment Patient Details Name: Karen Bennett MRN: [id number] DOB: September 24, 1975 Today's Date: 10/11/2023   History of present illness Pt is a 47 y.o. female admitted to [facility] from [facility] on 09/28/23 with LE edema, SOB, and abdominal distention. Chest x-ray showed CHF w/ layering B pleural effusions. Admitted w/ acute on chronic HFrEF, AKI, and hyperkalemia. Prior admit 1/8-1/28 for CHF exacerbation requiring aggressive diuresis and inotropic support. PMH significant for DM2, HTN, HLD, hypothyroidism, CKD IIIa, morbid obesity, Rt BKA, PE on Xarelto, DVT, endocarditis, cellulitis, lumbar radiculopathy, anxiety, depression, CVA.   OT comments  OT session largely focused on training in B UE therapeutic exercises in long sitting in bed and training in use of HEP with green theraband and handout provided to increase B UE and core strength and activity tolerance for carryover to functional tasks. Pt participated well in session from bed level but declining sitting EOB or addressing LB ADLs due to having diarrhea earlier this day and feeling she may again. Pt making good progress toward HEP goal and limited progress toward LB ADL and transfer goals. OT plans to focus next session on LB ADLs. Pt's VSS on RA throughout session. Pt will benefit from continued acute skilled OT services to address deficits outlined below and increase safety and independence with functional tasks. Discharge recommendation updated this day based on pt progress and current functional level. Post acute discharge, pt will benefit from continued skilled OT services in the home to maximize rehab potential.      If plan is discharge home, recommend the following:  Two people to help with walking and/or transfers;A lot of help with bathing/dressing/bathroom;Assistance with cooking/housework;Assist for transportation;Help with stairs or ramp for entrance   Equipment Recommendations  None recommended by OT (Pt already has  needed equipment)    Recommendations for Other Services      Precautions / Restrictions Precautions Precautions: Fall;Other (comment) Precaution/Restrictions Comments: h/o R BKA, pt reports is NWB due to spinter femur fx in 2018 Restrictions Weight Bearing Restrictions Per Provider Order: No RLE Weight Bearing Per Provider Order: Non weight bearing       Mobility Bed Mobility         Supine to sit: Independent, Used rails (to come to long sitting in the bed) Sit to supine: Independent, Used rails (from long sitting in bed to supine)   General bed mobility comments: Pt declining sitting EOB this session secondary to having diarrhea earlier in the day and continuing to feel she may have diarrhea again.    Transfers                         Balance Overall balance assessment: Independent (in sitting) Sitting-balance support: No upper extremity supported, Feet supported (in long sitting in the bed) Sitting balance-Leahy Scale: Good Sitting balance - Comments: able to maintain balance in long sitting without hand support briefly. Pt able to work core while exercising in long sitting in bed.                                   ADL either performed or assessed with clinical judgement   ADL Overall ADL's : Needs assistance/impaired  General ADL Comments: ADLs not addresed this session. Pt declining addressing LB ADLs due to having diarrhea earlier and feeling like she may have diarrhea again.    Extremity/Trunk Assessment Upper Extremity Assessment Upper Extremity Assessment: Left hand dominant;Overall WFL for tasks assessed (gross B UE strength 4/5)   Lower Extremity Assessment Lower Extremity Assessment: Defer to PT evaluation        Vision       Perception     Praxis     Communication Communication Communication: No apparent difficulties   Cognition Arousal: Alert Behavior During  Therapy: WFL for tasks assessed/performed Cognition: No apparent impairments             OT - Cognition Comments: AAOx4 and pleasant throughout session. Cognition WFL for tasks assessed, not formally screened or tested.                 Following commands: Intact        Cueing   Cueing Techniques: Verbal cues  Exercises Exercises: General Upper Extremity, General Lower Extremity General Exercises - Upper Extremity Shoulder Flexion: AROM, Strengthening, Both, 10 reps, Seated, Theraband (in long sitting in bed; increased activity tolerance, increased core strength) Theraband Level (Shoulder Flexion): Level 1 (Yellow) Shoulder Extension: AROM, Strengthening, Both, 10 reps, Seated, Theraband (in long sitting in bed; increased activity tolerance, increased core strength) Theraband Level (Shoulder Extension): Level 3 (Green) Shoulder Horizontal ABduction: AROM, Strengthening, Both, 10 reps, Seated, Theraband (in long sitting in bed; increased activity tolerance, increased core strength) Theraband Level (Shoulder Horizontal Abduction): Level 3 (Green) Shoulder Horizontal ADduction: AROM, Strengthening, Both, 10 reps, Seated, Theraband (in long sitting in bed; increased activity tolerance, increased core strength) Theraband Level (Shoulder Horizontal Adduction): Level 3 (Green) Elbow Flexion: AROM, Strengthening, Both, 10 reps, Seated, Theraband (in long sitting in bed; increased activity tolerance, increased core strength) Theraband Level (Elbow Flexion): Level 3 (Green) Elbow Extension: AROM, Strengthening, Both, 10 reps, Seated, Theraband (in long sitting in bed; increased activity tolerance, increased core strength) Theraband Level (Elbow Extension): Level 3 (Green)    Shoulder Instructions       General Comments OT educated pt in B UE HEP with green theraband and reviewed HEP handout with pt with pt verbalizing and demonstrating understanding through teach back. VSS on RA  throughout session    Pertinent Vitals/ Pain       Pain Assessment Pain Assessment: No/denies pain  Home Living                                          Prior Functioning/Environment              Frequency  Min 1X/week        Progress Toward Goals  OT Goals(current goals can now be found in the care plan section)  Progress towards OT goals: Progressing toward goals;Not progressing toward goals - comment (Pt progressing toward HEP goals but making limited progress toward LB ADL and transfer goals.)  Acute Rehab OT Goals Patient Stated Goal: to feel better and be able to return home  Plan      Co-evaluation    PT/OT/SLP Co-Evaluation/Treatment: Yes Reason for Co-Treatment: Complexity of the patient's impairments (multi-system involvement);For patient/therapist safety PT goals addressed during session: Mobility/safety with mobility (somewhat dovetailed session overlapping a few min) OT goals addressed during session: Strengthening/ROM      AM-PAC OT "6  Clicks" Daily Activity     Outcome Measure   Help from another person eating meals?: None Help from another person taking care of personal grooming?: None (in sitting) Help from another person toileting, which includes using toliet, bedpan, or urinal?: A Lot Help from another person bathing (including washing, rinsing, drying)?: A Lot Help from another person to put on and taking off regular upper body clothing?: None Help from another person to put on and taking off regular lower body clothing?: A Lot 6 Click Score: 18    End of Session    OT Visit Diagnosis: Other (comment) (decreased activity tolerance)   Activity Tolerance Patient tolerated treatment well;Other (comment) (Somewhat self-limiting with pt declining sitting EOB or LB ADLs due to having diarrhea earlier in the day)   Patient Left in bed;with call bell/phone within reach;with nursing/sitter in room   Nurse Communication Mobility  status;Other (comment) (Pt feels she may have more diarrhea.)        Time: 1201-1221 OT Time Calculation (min): 20 min  Charges: OT General Charges $OT Visit: 1 Visit OT Treatments $Therapeutic Exercise: 8-22 mins  Chianna Spirito "Orson Eva., OTR/L, MA Acute Rehab [phone]  Lendon Colonel 10/11/2023, 3:22 PM

## 2023-10-11 NOTE — Progress Notes (Signed)
 PROGRESS NOTE    Karen Bennett  ZOX:[id number] DOB: 10-14-1975 DOA: 09/22/2023 PCP: [name], NP  47/F w morbid obesity, chronic systolic CHF, HTN, HLD, IDDM, hypothyroidism, prior DVT/PE, history of Candida mitral valve endocarditis in 2022, pulmonary hypertension, CKD 3B, right BKA, prior CVA, presented to ED with worsening abdominal distention, lower extremity swelling and shortness of breath.  -recently hospitalized and discharged in January 2025 for systolic CHF, required diuresis with milrinone.  -In the ED she was found to have significant volume overload, acute kidney injury, and hyperkalemia.  -placed on IV furosemide, and inotropic support with milrinone.  -3/07 transferred to [facility] -3/9-14: Continued on high-dose diuretics -3/13: Palliative care meeting, wishes for full code, full scope -Remains on Lasix gtt. and milrinone, CVP remains elevated  Subjective: Complains of loose stools overnight.  Denies any abdominal pain.  Some nausea but that has been ongoing for several days. Urinating well.  Denies any chest pain or shortness of breath.  Assessment and Plan:  Acute on chronic systolic heart failure (HCC) BiV Failure Pulm HTN -Echo w/ EF 25 to 30%, severe LV cavity dilatation, RV moderately reduced, RVSP 78.2  -CHF team following Patient was started on milrinone as well as furosemide infusion. Seems to be diuresing well.  Management per cardiology. -GDMT limited by AKI/CKD -Palliative consulted for goals of care, overall poor prognosis with limited support> patient wishes for full code and full scope -Adamantly declines SNF -remove foley when she's off lasix gtt Saturating normal on room air.  Hypokalemia, hypomagnesemia, hyponatremia Continue to monitor electrolytes and supplement as indicated.  Diarrhea Experiencing loose stools over the last 48 hours.  Abdomen is benign on examination.  Noted to be on stool softeners and laxatives which were discontinued.   Imodium will be given today.  No probiotics since she has a central line.   She is noted to be afebrile.  Last WBC was normal.  Will recheck CBC in the morning.  Acute on chronic anemia -Prior anemia panel with iron deficiency, given IV iron 3/16  Frequent PVC,  -continue mexiletine, monitor lytes  AKI on CKD4 Baseline creatinine is 2.0.   Creatinine was 3.2 on admission.  Noted to be improving.  Continue to monitor urine output.  Avoid nephrotoxic agents.    History of pulmonary embolism Continue apixaban.   Type 2 diabetes mellitus  -CBGs are stable on current dose of glargine  Hx of right BKA (HCC) PT and OT consulted, lives alone, uses a power chair at baseline and transfers to the toilet, adamantly declines SNF, she has a home health aide  Hypothyroidism Continue with levothyroxine   Morbid Obesity OSA/OHS -declined sleep study, needs significant wt loss -Attempt nighttime BiPAP  History of CVA (cerebrovascular accident) -Continue apixaban and statin  Anxiety and depression Continue with as needed hydroxyzine    DVT prophylaxis: apixaban Code Status: Full Code Family Communication: None present Disposition Plan: Home with home health when cleared by cardiology  Consultants: Cards    Objective: Vitals:   10/10/23 1929 10/10/23 2337 10/11/23 0304 10/11/23 0735  BP: (!) 113/57 (!) 102/55 120/66 114/60  Pulse: 96 98 100 94  Resp: 18 16 20    Temp: 98.6 F (37 C) 98.7 F (37.1 C) 97.9 F (36.6 C) 98.6 F (37 C)  TempSrc: Oral Oral Oral Oral  SpO2: 95% 94% 92% 92%  Weight:   (!) 193.1 kg   Height:        Intake/Output Summary (Last 24 hours) at 10/11/2023  1038 Last data filed at 10/11/2023 0400 Gross per 24 hour  Intake 527.6 ml  Output 1875 ml  Net -1347.4 ml   Filed Weights   10/09/23 0647 10/10/23 0500 10/11/23 0304  Weight: (!) 192.4 kg (!) 193.2 kg (!) 193.1 kg    Examination:  General appearance: Awake alert.  In no distress Resp: Clear  to auscultation bilaterally.  Normal effort Cardio: S1-S2 is normal regular.  No S3-S4.  No rubs murmurs or bruit GI: Abdomen is soft.  Obese.  Examination limited due to body habitus.some discomfort in the lower abdomen but essentially nontender.      Data Reviewed:   CBC: Recent Labs  Lab 10/07/23 0500 10/08/23 0400  WBC 4.1 4.6  HGB 8.9* 9.6*  HCT 28.7* 31.5*  MCV 86.7 87.5  PLT 253 294   Basic Metabolic Panel: Recent Labs  Lab 10/07/23 0500 10/08/23 0352 10/09/23 0500 10/10/23 0400 10/11/23 0315  NA 131* 132* 133* 133* 129*  K 3.5 3.6 3.4* 4.3 3.5  CL 94* 96* 96* 98 92*  CO2 26 28 25 26 27   GLUCOSE 163* 99 114* 148* 198*  BUN 76* 72* 72* 71* 72*  CREATININE 2.36* 2.15* 2.13* 2.22* 2.23*  CALCIUM 8.1* 8.0* 8.0* 8.3* 8.3*  MG 2.0 2.1 1.9 2.2 2.1   GFR: Estimated Creatinine Clearance: 56.1 mL/min (A) (by C-G formula based on SCr of 2.23 mg/dL (H)).  CBG: Recent Labs  Lab 10/10/23 0619 10/10/23 1151 10/10/23 1650 10/10/23 2120 10/11/23 0556  GLUCAP 145* 133* 128* 114* 127*    Urine analysis:    Component Value Date/Time   COLORURINE AMBER (A) 03/22/2023 0426   APPEARANCEUR TURBID (A) 03/22/2023 0426   LABSPEC 1.040 (H) 03/22/2023 0426   PHURINE 5.0 03/22/2023 0426   GLUCOSEU >=500 (A) 03/22/2023 0426   HGBUR LARGE (A) 03/22/2023 0426   BILIRUBINUR NEGATIVE 03/22/2023 0426   KETONESUR NEGATIVE 03/22/2023 0426   PROTEINUR >=300 (A) 03/22/2023 0426   UROBILINOGEN 0.2 09/05/2011 0558   NITRITE POSITIVE (A) 03/22/2023 0426   LEUKOCYTESUR MODERATE (A) 03/22/2023 0426    Radiology Studies: No results found.   Scheduled Meds:  acetaZOLAMIDE  500 mg Oral BID   apixaban  5 mg Oral BID   atorvastatin  40 mg Oral QPM   clotrimazole  1 Application Topical BID   ezetimibe  10 mg Oral Daily   insulin aspart  0-15 Units Subcutaneous TID WC   insulin aspart  0-5 Units Subcutaneous QHS   levothyroxine  75 mcg Oral QAC breakfast   loperamide  4 mg Oral  TID   metolazone  10 mg Oral BID   mexiletine  250 mg Oral Q12H   potassium chloride  60 mEq Oral Q4H   sodium chloride flush  10-40 mL Intracatheter Q12H   spironolactone  25 mg Oral Daily   Continuous Infusions:  furosemide (LASIX) 200 mg in dextrose 5 % 100 mL (2 mg/mL) infusion 40 mg/hr (10/11/23 0855)   milrinone 0.25 mcg/kg/min (10/11/23 0905)     LOS: 19 days    [employer]  [facility]   10/11/2023, 10:38 AM

## 2023-10-11 NOTE — Plan of Care (Signed)
  Problem: Skin Integrity: Goal: Risk for impaired skin integrity will decrease Outcome: Progressing   Problem: Activity: Goal: Risk for activity intolerance will decrease Outcome: Progressing   Problem: Nutrition: Goal: Adequate nutrition will be maintained Outcome: Progressing   Problem: Coping: Goal: Level of anxiety will decrease Outcome: Progressing   Problem: Elimination: Goal: Will not experience complications related to bowel motility Outcome: Progressing Goal: Will not experience complications related to urinary retention Outcome: Progressing   Problem: Pain Managment: Goal: General experience of comfort will improve and/or be controlled Outcome: Progressing   Problem: Safety: Goal: Ability to remain free from injury will improve Outcome: Progressing   Problem: Skin Integrity: Goal: Risk for impaired skin integrity will decrease Outcome: Progressing

## 2023-10-11 NOTE — Plan of Care (Signed)

## 2023-10-12 DIAGNOSIS — I5023 Acute on chronic systolic (congestive) heart failure: Secondary | ICD-10-CM | POA: Diagnosis not present

## 2023-10-12 LAB — BASIC METABOLIC PANEL
Anion gap: 10 (ref 5–15)
BUN: 73 mg/dL — ABNORMAL HIGH (ref 6–20)
CO2: 27 mmol/L (ref 22–32)
Calcium: 8.4 mg/dL — ABNORMAL LOW (ref 8.9–10.3)
Chloride: 95 mmol/L — ABNORMAL LOW (ref 98–111)
Creatinine, Ser: 2.39 mg/dL — ABNORMAL HIGH (ref 0.44–1.00)
GFR, Estimated: 25 mL/min — ABNORMAL LOW (ref >60)
Glucose, Bld: 142 mg/dL — ABNORMAL HIGH (ref 70–99)
Potassium: 3.8 mmol/L (ref 3.5–5.1)
Sodium: 132 mmol/L — ABNORMAL LOW (ref 135–145)

## 2023-10-12 LAB — GLUCOSE, CAPILLARY
Glucose-Capillary: 144 mg/dL — ABNORMAL HIGH (ref 70–99)
Glucose-Capillary: 115 mg/dL — ABNORMAL HIGH (ref 70–99)
Glucose-Capillary: 137 mg/dL — ABNORMAL HIGH (ref 70–99)
Glucose-Capillary: 115 mg/dL — ABNORMAL HIGH (ref 70–99)

## 2023-10-12 LAB — CBC
HCT: 30 % — ABNORMAL LOW (ref 36.0–46.0)
Hemoglobin: 9.1 g/dL — ABNORMAL LOW (ref 12.0–15.0)
MCH: 26.8 pg (ref 26.0–34.0)
MCHC: 30.3 g/dL (ref 30.0–36.0)
MCV: 88.2 fL (ref 80.0–100.0)
Platelets: 250 10*3/uL (ref 150–400)
RBC: 3.4 MIL/uL — ABNORMAL LOW (ref 3.87–5.11)
RDW: 14.6 % (ref 11.5–15.5)
WBC: 4.3 10*3/uL (ref 4.0–10.5)
nRBC: 0 % (ref 0.0–0.2)

## 2023-10-12 LAB — COOXEMETRY PANEL
Carboxyhemoglobin: 1.7 % — ABNORMAL HIGH (ref 0.5–1.5)
Methemoglobin: 0.7 % (ref 0.0–1.5)
O2 Saturation: 74.9 %
Total hemoglobin: 9.3 g/dL — ABNORMAL LOW (ref 12.0–16.0)

## 2023-10-12 LAB — MAGNESIUM: Magnesium: 2.1 mg/dL (ref 1.7–2.4)

## 2023-10-12 MED ORDER — POTASSIUM CHLORIDE CRYS ER 20 MEQ PO TBCR
40.0000 meq | EXTENDED_RELEASE_TABLET | Freq: Two times a day (BID) | ORAL | Status: AC
Start: 2023-10-12 — End: 2023-10-12
  Administered 2023-10-12 (×2): 40 meq via ORAL
  Filled 2023-10-12 (×2): qty 2

## 2023-10-12 NOTE — Progress Notes (Addendum)
 Patient ID: Karen Bennett, female   DOB: April 03, 1976, 48 y.o.   MRN: [id number]     Advanced Heart Failure Rounding Note  Cardiologist: Chilton Si, MD  Chief Complaint: Fatigue Subjective:   3/16 Over the weekend continued to diurese with Lasix gtt 30 mg/hr, metolazone 10 bid, acetazolamide 500 bid.  3/18 Lasix drip increased to 40 mg per hour.  3/20 Started milrinone wean. Lasix gtt switched to po Torsemide.  CO-OX 75% on 0.125 milrinone. CVP 18 but waveform flat. Lasix gtt stopped yesterday am. Received 100 mg Torsemide + 10 mg metolazone BID yesterday with 3L UOP.   Bed weight down 1 lb.   Reports limiting fluid intake. Has 3 drinks on tray.     Objective:   Weight Range: (!) 192.6 kg Body mass index is 68.53 kg/m.   Vital Signs:   Temp:  [98.1 F (36.7 C)-98.6 F (37 C)] 98.5 F (36.9 C) (03/21 0336) Pulse Rate:  [94-100] 98 (03/21 0336) Resp:  [18-20] 18 (03/20 1915) BP: (98-123)/(54-66) 112/61 (03/21 0336) SpO2:  [91 %-95 %] 95 % (03/21 0336) Weight:  [192.6 kg] 192.6 kg (03/21 0500) Last BM Date : 10/11/23  Weight change: Filed Weights   10/10/23 0500 10/11/23 0304 10/12/23 0500  Weight: (!) 193.2 kg (!) 193.1 kg (!) 192.6 kg    Intake/Output:   Intake/Output Summary (Last 24 hours) at 10/12/2023 0706 Last data filed at 10/12/2023 0600 Gross per 24 hour  Intake 772.7 ml  Output 3000 ml  Net -2227.3 ml     Physical Exam  General:  Chronically ill appearing Neck: JVD difficult d/t thick neck Cor: Regular rate & rhythm. No rubs, gallops or murmurs. Lungs: diminished Abdomen: super morbidly obese + sacral edema Extremities: R BKA, UNNA boot LLE.  Neuro: alert & orientedx3. Affect pleasant    Telemetry   SR 90s  EKG    No new EKG to review  Labs    CBC No results for input(s): "WBC", "NEUTROABS", "HGB", "HCT", "MCV", "PLT" in the last 72 hours.   Basic Metabolic Panel Recent Labs    40/98/11 0315 10/12/23 0505  NA 129* 132*   K 3.5 3.8  CL 92* 95*  CO2 27 27  GLUCOSE 198* 142*  BUN 72* 73*  CREATININE 2.23* 2.39*  CALCIUM 8.3* 8.4*  MG 2.1 2.1   Liver Function Tests No results for input(s): "AST", "ALT", "ALKPHOS", "BILITOT", "PROT", "ALBUMIN" in the last 72 hours. No results for input(s): "LIPASE", "AMYLASE" in the last 72 hours. Cardiac Enzymes No results for input(s): "CKTOTAL", "CKMB", "CKMBINDEX", "TROPONINI" in the last 72 hours.  BNP: BNP (last 3 results) Recent Labs    02/09/23 1736 08/01/23 1140 09/22/23 0215  BNP 345.1* 523.3* 572.3*    ProBNP (last 3 results) No results for input(s): "PROBNP" in the last 8760 hours.   D-Dimer No results for input(s): "DDIMER" in the last 72 hours. Hemoglobin A1C No results for input(s): "HGBA1C" in the last 72 hours. Fasting Lipid Panel No results for input(s): "CHOL", "HDL", "LDLCALC", "TRIG", "CHOLHDL", "LDLDIRECT" in the last 72 hours. Thyroid Function Tests No results for input(s): "TSH", "T4TOTAL", "T3FREE", "THYROIDAB" in the last 72 hours.  Invalid input(s): "FREET3"  Other results:   Imaging    No results found.   Medications:     Scheduled Medications:  apixaban  5 mg Oral BID   atorvastatin  40 mg Oral QPM   clotrimazole  1 Application Topical BID   ezetimibe  10 mg  Oral Daily   insulin aspart  0-15 Units Subcutaneous TID WC   insulin aspart  0-5 Units Subcutaneous QHS   levothyroxine  75 mcg Oral QAC breakfast   metolazone  10 mg Oral BID   mexiletine  250 mg Oral Q12H   sodium chloride flush  10-40 mL Intracatheter Q12H   spironolactone  25 mg Oral Daily   torsemide  100 mg Oral BID    Infusions:  milrinone 0.125 mcg/kg/min (10/12/23 0600)    PRN Medications: acetaminophen **OR** acetaminophen, alum & mag hydroxide-simeth, diphenhydrAMINE-zinc acetate, Gerhardt's butt cream, hydrOXYzine, loperamide, mineral oil-hydrophilic petrolatum, mouth rinse, oxyCODONE, sodium chloride flush    Patient Profile   48  YO WF with CKD stage III, hypothyroidism, mitral valve endocarditis in 2022, history of CVA, right BKA, severe morbid obesity (BMI 70) and chronic biventricular failure currently admitted with heart failure exacerbation.   Assessment/Plan   1. A/C Biventricular HF -EF continues to decline.  -Previous Echo EF 35-40% EF but now 25-30% + RV failure.  -Etiology unclear. She has not had ischemic work up due to weight/CKD. ? PVCs. Now well suppressed with mexiletine. -Readmitted with marked volume overload. Poor response with intermittent IV lasix and IV bumex. Milrinone added to augment diuresis.  -Have started milrinone wean. CO-OX 75% on 0.125 milrinone. Stop milrinone.  -CVP 18 with flat waveform. Continue 100 mg Torsemide BID + 10 mg metolazone BID.  - volume likely as good as we can get. Discussed fluid restriction in detail.  - GDMT limited by hypotension and AKI  - Continue spiro 25 mg daily.   - No SGLT2i with concern for hygiene  2. CKD  Stage IV on AKI  - SCr 3.2 on admit, b/l ~2.0  - Scr stable at 2.4 this am - Follow with discontinuation of milrinone  3. Hypothyroidism  - Recent TSH stable.    4. PVCs  -Continue mexiletine 250 every 12 hours.  -Well suppressed   5. H/O PE/DVT - Continue eliquis 5 mg bid.    6.Suspected OHS/OSA - Refuses sleep study   7. Severe Morbid Obesity.  -DMII.  -HGb A1C 7.2  -Consider GLP1 as an outpatient.    8. Right BKA   9. Hypokalemia - Supp K with diuresis -Continue  spiro to 25 mg daily.    10. GOC Palliative Care following. Plan for outpatient follow up. If renal function or volume status worsens off milrinone will need to consider transition to hospice care.  Disposition: Planning HH /[facility] for Palliative Care. - Refuses SNF  Length of Stay: 20  FINCH, LINDSAY N, PA-C  10/12/2023, 7:06 AM  Advanced Heart Failure Team Pager [phone] (M-F; 7a - 5p)  Please contact [facility] for night-coverage after  hours (5p -7a ) and weekends on [email or web address]  Patient seen and examined with the above-signed Advanced Practice Provider and/or Housestaff. I personally reviewed laboratory data, imaging studies and relevant notes. I independently examined the patient and formulated the important aspects of the plan. I have edited the note to reflect any of my changes or salient points. I have personally discussed the plan with the patient and/or family.  IV lasix stopped yesterday. Switched to po torsemide 100 bid. Milrinone stopped this am.   Says she feels fine. Anxious to go home.   When I walked in she was sitting on bed eating [employer] sandwich a friend brought her  General:  Obese woman sitting up in bed  No resp difficulty HEENT: normal Neck: supple.  JVP 10  Carotids 2+ bilat; no bruits. No lymphadenopathy or thryomegaly appreciated. GGY:IRSWNIO rate & rhythm. No rubs, gallops or murmurs. Lungs: clear Abdomen: markedly obese soft, nontender, nondistended.  Good bowel sounds. Extremities: no cyanosis, clubbing, rash, tr edema on left. R BKA Neuro: alert & orientedx3, cranial nerves grossly intact. moves all 4 extremities w/o difficulty. Affect pleasant  Agree with stopping milrinone. Would stop checking co-ox as well. Goal will be to manage volume status with po meds. Discussed need for dietary restriction. GDMT limited by hypotension and renal function.   Refuses SNF. Has HH aide.  Possibly home tomorrow if volume status and renal function stable.   Arvilla Meres, MD  1:53 PM

## 2023-10-12 NOTE — Progress Notes (Signed)
 PROGRESS NOTE    Karen Bennett  NWG:[id number] DOB: 28-Jan-1976 DOA: 09/22/2023 PCP: [name], NP   Brief Narrative:  47/F w morbid obesity, chronic systolic CHF, HTN, HLD, IDDM, hypothyroidism, prior DVT/PE, history of Candida mitral valve endocarditis in 2022, pulmonary hypertension, CKD 3B, right BKA, prior CVA, presented to ED with worsening abdominal distention, lower extremity swelling and shortness of breath.  -recently hospitalized and discharged in January 2025 for systolic CHF, required diuresis with milrinone.  -In the ED she was found to have significant volume overload, acute kidney injury, and hyperkalemia.  -placed on IV furosemide, and inotropic support with milrinone.  -3/07 transferred to [facility] -3/9-14: Continued on high-dose diuretics -3/13: Palliative care meeting, wishes for full code, full scope Was on on Lasix gtt, transition to torsemide 10/11/2023. and milrinone which was stopped 10/12/2023, CVP remains elevated  Assessment & Plan:   Principal Problem:   Acute on chronic systolic heart failure (HCC) Active Problems:   Acute kidney injury superimposed on stage 4 chronic kidney disease (HCC)   History of pulmonary embolism   Type 2 diabetes mellitus with hyperlipidemia (HCC)   Hx of right BKA (HCC)   Hypothyroidism   BMI 60.0-69.9, adult (HCC)   History of CVA (cerebrovascular accident)   Anxiety and depression  Acute on chronic systolic heart failure (HCC) BiV Failure Pulm HTN -Echo w/ EF 25 to 30%, severe LV cavity dilatation, RV moderately reduced, RVSP 78.2. Patient was started on milrinone as well as furosemide infusion. -GDMT limited by AKI/CKD -Palliative consulted for goals of care, overall poor prognosis with limited support> patient wishes for full code and full scope -Adamantly declines SNF -remove foley when she's persistently off lasix gtt for at least 1 to 2 days.  Milrinone stopped, transition from IV Lasix to torsemide.  She is also on  Aldactone. Saturating normal on room air.  Per cardiology, if she starts volume overload again, the only option will be hospice.   Hypokalemia, hypomagnesemia, hyponatremia Continue to monitor electrolytes and supplement as indicated.   Diarrhea Improving on Imodium.   Acute on chronic anemia -Prior anemia panel with iron deficiency, given IV iron 3/16   Frequent PVC,  -continue mexiletine, monitor lytes   AKI on CKD4 Baseline creatinine is 2.0.   Creatinine was 3.2 on admission.  Improved and has been stable between 2.2-2.4.  Continue to monitor urine output.  Avoid nephrotoxic agents.     History of pulmonary embolism Continue apixaban.    Type 2 diabetes mellitus  -Only on SSI, blood sugar controlled.  Glargine on hold.   Hx of right BKA (HCC) PT and OT consulted, lives alone, uses a power chair at baseline and transfers to the toilet, adamantly declines SNF, she has a home health aide   Hypothyroidism Continue with levothyroxine    Morbid Obesity OSA/OHS -declined sleep study, needs significant wt loss -Attempt nighttime BiPAP   History of CVA (cerebrovascular accident) -Continue apixaban and statin   Anxiety and depression Continue with as needed hydroxyzine   DVT prophylaxis: Eliquis   Code Status: Full Code  Family Communication:  None present at bedside.  Plan of care discussed with patient in length and he/she verbalized understanding and agreed with it.  Status is: Inpatient Remains inpatient appropriate because: Observing off of milrinone.   Estimated body mass index is 68.53 kg/m as calculated from the following:   Height as of this encounter: 5\' 6"  (1.676 m).   Weight as of this encounter: 192.6 kg.  Pressure Injury 08/10/23 Tibial Posterior;Proximal;Right Stage 2 -  Partial thickness loss of dermis presenting as a shallow open injury with a red, pink wound bed without slough. small circular pressure injury on the back of stump (Active)  08/10/23  1659  Location: Tibial  Location Orientation: Posterior;Proximal;Right  Staging: Stage 2 -  Partial thickness loss of dermis presenting as a shallow open injury with a red, pink wound bed without slough.  Wound Description (Comments): small circular pressure injury on the back of stump  Present on Admission: Yes  Dressing Type None 10/09/23 2302   Nutritional Assessment: Body mass index is 68.53 kg/m.Marland Kitchen Seen by dietician.  I agree with the assessment and plan as outlined below: Nutrition Status:        . Skin Assessment: I have examined the patient's skin and I agree with the wound assessment as performed by the wound care RN as outlined below: Pressure Injury 08/10/23 Tibial Posterior;Proximal;Right Stage 2 -  Partial thickness loss of dermis presenting as a shallow open injury with a red, pink wound bed without slough. small circular pressure injury on the back of stump (Active)  08/10/23 1659  Location: Tibial  Location Orientation: Posterior;Proximal;Right  Staging: Stage 2 -  Partial thickness loss of dermis presenting as a shallow open injury with a red, pink wound bed without slough.  Wound Description (Comments): small circular pressure injury on the back of stump  Present on Admission: Yes  Dressing Type None 10/09/23 2302    Consultants:  Cardiology  Procedures:  As above  Antimicrobials:  Anti-infectives (From admission, onward)    None         Subjective: Patient seen and examined, doing well.  No complaints at all.  Diarrhea resolved.  Objective: Vitals:   10/11/23 2342 10/12/23 0336 10/12/23 0500 10/12/23 0818  BP: (!) 101/54 112/61  (!) 106/49  Pulse: 95 98  (!) 103  Resp:    20  Temp: 98.1 F (36.7 C) 98.5 F (36.9 C)  98.5 F (36.9 C)  TempSrc: Oral Oral  Oral  SpO2: 91% 95%  93%  Weight:   (!) 192.6 kg   Height:        Intake/Output Summary (Last 24 hours) at 10/12/2023 0834 Last data filed at 10/12/2023 6213 Gross per 24 hour  Intake  1012.7 ml  Output 3000 ml  Net -1987.3 ml   Filed Weights   10/10/23 0500 10/11/23 0304 10/12/23 0500  Weight: (!) 193.2 kg (!) 193.1 kg (!) 192.6 kg    Examination:  General exam: Appears calm and comfortable, morbidly obese Respiratory system: Clear to auscultation. Respiratory effort normal. Cardiovascular system: S1 & S2 heard, RRR. No JVD, murmurs, rubs, gallops or clicks. No pedal edema. Gastrointestinal system: Abdomen is nondistended, soft and nontender. No organomegaly or masses felt. Normal bowel sounds heard. Central nervous system: Alert and oriented. No focal neurological deficits. Extremities: Right BKA. Psychiatry: Judgement and insight appear normal. Mood & affect appropriate.    Data Reviewed: I have personally reviewed following labs and imaging studies  CBC: Recent Labs  Lab 10/07/23 0500 10/08/23 0400 10/12/23 0505  WBC 4.1 4.6 4.3  HGB 8.9* 9.6* 9.1*  HCT 28.7* 31.5* 30.0*  MCV 86.7 87.5 88.2  PLT 253 294 250   Basic Metabolic Panel: Recent Labs  Lab 10/08/23 0352 10/09/23 0500 10/10/23 0400 10/11/23 0315 10/12/23 0505  NA 132* 133* 133* 129* 132*  K 3.6 3.4* 4.3 3.5 3.8  CL 96* 96* 98 92* 95*  CO2 28 25 26 27 27   GLUCOSE 99 114* 148* 198* 142*  BUN 72* 72* 71* 72* 73*  CREATININE 2.15* 2.13* 2.22* 2.23* 2.39*  CALCIUM 8.0* 8.0* 8.3* 8.3* 8.4*  MG 2.1 1.9 2.2 2.1 2.1   GFR: Estimated Creatinine Clearance: 52.3 mL/min (A) (by C-G formula based on SCr of 2.39 mg/dL (H)). Liver Function Tests: No results for input(s): "AST", "ALT", "ALKPHOS", "BILITOT", "PROT", "ALBUMIN" in the last 168 hours. No results for input(s): "LIPASE", "AMYLASE" in the last 168 hours. No results for input(s): "AMMONIA" in the last 168 hours. Coagulation Profile: No results for input(s): "INR", "PROTIME" in the last 168 hours. Cardiac Enzymes: No results for input(s): "CKTOTAL", "CKMB", "CKMBINDEX", "TROPONINI" in the last 168 hours. BNP (last 3 results) No  results for input(s): "PROBNP" in the last 8760 hours. HbA1C: No results for input(s): "HGBA1C" in the last 72 hours. CBG: Recent Labs  Lab 10/11/23 0556 10/11/23 1221 10/11/23 1639 10/11/23 2051 10/12/23 0617  GLUCAP 127* 125* 117* 127* 144*   Lipid Profile: No results for input(s): "CHOL", "HDL", "LDLCALC", "TRIG", "CHOLHDL", "LDLDIRECT" in the last 72 hours. Thyroid Function Tests: No results for input(s): "TSH", "T4TOTAL", "FREET4", "T3FREE", "THYROIDAB" in the last 72 hours. Anemia Panel: No results for input(s): "VITAMINB12", "FOLATE", "FERRITIN", "TIBC", "IRON", "RETICCTPCT" in the last 72 hours. Sepsis Labs: No results for input(s): "PROCALCITON", "LATICACIDVEN" in the last 168 hours.  No results found for this or any previous visit (from the past 240 hours).   Radiology Studies: No results found.  Scheduled Meds:  apixaban  5 mg Oral BID   atorvastatin  40 mg Oral QPM   clotrimazole  1 Application Topical BID   ezetimibe  10 mg Oral Daily   insulin aspart  0-15 Units Subcutaneous TID WC   insulin aspart  0-5 Units Subcutaneous QHS   levothyroxine  75 mcg Oral QAC breakfast   metolazone  10 mg Oral BID   mexiletine  250 mg Oral Q12H   potassium chloride  40 mEq Oral BID   sodium chloride flush  10-40 mL Intracatheter Q12H   spironolactone  25 mg Oral Daily   torsemide  100 mg Oral BID   Continuous Infusions:   LOS: 20 days   Hughie Closs, MD [facility]  10/12/2023, 8:34 AM   *Please note that this is a verbal dictation therefore any spelling or grammatical errors are due to the "[employer] One" system interpretation.  Please page via Amion and do not message via secure chat for urgent patient care matters. Secure chat can be used for non urgent patient care matters.  How to contact the [facility] Attending or Consulting provider 7A - 7P or covering provider during after hours 7P -7A, for this patient?  Check the care team in [facility] and look for a)  attending/consulting [facility] provider listed and b) the [facility] team listed. Page or secure chat 7A-7P. Log into [email or web address] and use [place]'s universal password to access. If you do not have the password, please contact the hospital operator. Locate the [facility] provider you are looking for under [facility] and page to a number that you can be directly reached. If you still have difficulty reaching the provider, please page the [facility] (Director on Call) for the Hospitalists listed on amion for assistance.

## 2023-10-12 NOTE — Plan of Care (Signed)
  Problem: Coping: Goal: Ability to adjust to condition or change in health will improve Outcome: Progressing   Problem: Nutritional: Goal: Maintenance of adequate nutrition will improve Outcome: Progressing Goal: Progress toward achieving an optimal weight will improve Outcome: Progressing   Problem: Nutrition: Goal: Adequate nutrition will be maintained Outcome: Progressing   Problem: Coping: Goal: Level of anxiety will decrease Outcome: Progressing   Problem: Elimination: Goal: Will not experience complications related to bowel motility Outcome: Progressing Goal: Will not experience complications related to urinary retention Outcome: Progressing   Problem: Pain Managment: Goal: General experience of comfort will improve and/or be controlled Outcome: Progressing   Problem: Safety: Goal: Ability to remain free from injury will improve Outcome: Progressing   Problem: Skin Integrity: Goal: Risk for impaired skin integrity will decrease Outcome: Progressing

## 2023-10-13 ENCOUNTER — Other Ambulatory Visit (HOSPITAL_COMMUNITY): Payer: Self-pay

## 2023-10-13 DIAGNOSIS — I5023 Acute on chronic systolic (congestive) heart failure: Secondary | ICD-10-CM | POA: Diagnosis not present

## 2023-10-13 LAB — BASIC METABOLIC PANEL
Anion gap: 12 (ref 5–15)
BUN: 74 mg/dL — ABNORMAL HIGH (ref 6–20)
CO2: 26 mmol/L (ref 22–32)
Calcium: 8.7 mg/dL — ABNORMAL LOW (ref 8.9–10.3)
Chloride: 96 mmol/L — ABNORMAL LOW (ref 98–111)
Creatinine, Ser: 2.38 mg/dL — ABNORMAL HIGH (ref 0.44–1.00)
GFR, Estimated: 25 mL/min — ABNORMAL LOW (ref >60)
Glucose, Bld: 113 mg/dL — ABNORMAL HIGH (ref 70–99)
Potassium: 3.9 mmol/L (ref 3.5–5.1)
Sodium: 134 mmol/L — ABNORMAL LOW (ref 135–145)

## 2023-10-13 LAB — GLUCOSE, CAPILLARY
Glucose-Capillary: 99 mg/dL (ref 70–99)
Glucose-Capillary: 175 mg/dL — ABNORMAL HIGH (ref 70–99)
Glucose-Capillary: 109 mg/dL — ABNORMAL HIGH (ref 70–99)

## 2023-10-13 LAB — MAGNESIUM: Magnesium: 2.1 mg/dL (ref 1.7–2.4)

## 2023-10-13 MED ORDER — METOLAZONE 5 MG PO TABS
ORAL_TABLET | ORAL | 0 refills | Status: AC
  Filled 2023-10-13: qty 18, 9d supply, fill #0

## 2023-10-13 MED ORDER — DIPHENHYDRAMINE HCL 25 MG PO CAPS: 25.0000 mg | ORAL_CAPSULE | Freq: Once | ORAL | Status: DC | PRN

## 2023-10-13 MED ORDER — POTASSIUM CHLORIDE CRYS ER 20 MEQ PO TBCR
40.0000 meq | EXTENDED_RELEASE_TABLET | ORAL | 0 refills | Status: AC
  Filled 2023-10-13: qty 30, 52d supply, fill #0

## 2023-10-13 MED ORDER — DIPHENHYDRAMINE HCL 25 MG PO CAPS
25.0000 mg | ORAL_CAPSULE | Freq: Once | ORAL | Status: AC
  Administered 2023-10-13: 25 mg via ORAL
  Filled 2023-10-13: qty 1

## 2023-10-13 MED ORDER — OXYCODONE HCL 10 MG PO TABS
20.0000 mg | ORAL_TABLET | Freq: Four times a day (QID) | ORAL | 0 refills | Status: AC | PRN
Start: 2023-10-13 — End: ?
  Filled 2023-10-13: qty 30, 4d supply, fill #0

## 2023-10-13 MED ORDER — DIPHENHYDRAMINE HCL 50 MG/ML IJ SOLN
12.5000 mg | Freq: Once | INTRAMUSCULAR | Status: DC
  Filled 2023-10-13: qty 0.25

## 2023-10-13 MED ORDER — POTASSIUM CHLORIDE CRYS ER 20 MEQ PO TBCR
20.0000 meq | EXTENDED_RELEASE_TABLET | Freq: Two times a day (BID) | ORAL | 0 refills | Status: AC
  Filled 2023-10-13: qty 60, 30d supply, fill #0

## 2023-10-13 MED ORDER — TORSEMIDE 100 MG PO TABS
100.0000 mg | ORAL_TABLET | Freq: Two times a day (BID) | ORAL | 0 refills | Status: AC
  Filled 2023-10-13: qty 60, 30d supply, fill #0

## 2023-10-13 MED ORDER — HYDROXYZINE HCL 10 MG PO TABS: 10.0000 mg | ORAL_TABLET | Freq: Three times a day (TID) | ORAL | Status: DC | PRN

## 2023-10-13 MED ORDER — HYDROXYZINE HCL 10 MG PO TABS
10.0000 mg | ORAL_TABLET | Freq: Three times a day (TID) | ORAL | Status: DC | PRN
  Administered 2023-10-13: 10 mg via ORAL
  Filled 2023-10-13: qty 1

## 2023-10-13 NOTE — Progress Notes (Signed)
 [facility] night cross cover note:   I was notified by RN of the patient's report of continuation of generalized itching in spite of Atarax as well as Benadryl cream.  I subsequently placed one-time order for Benadryl 25 mg p.o. x 1 dose now.     Newton Pigg, DO Hospitalist

## 2023-10-13 NOTE — Discharge Summary (Signed)
 Physician Discharge Summary  Karen Bennett:[id number] DOB: 03-24-1976 DOA: 09/22/2023  PCP: [name], NP  Admit date: 09/22/2023 Discharge date: 10/13/2023 30 Day Unplanned Readmission Risk Score    Flowsheet Row ED to Hosp-Admission (Current) from 09/22/2023 in [place] 2C CV PROGRESSIVE CARE  30 Day Unplanned Readmission Risk Score (%) 38.53 Filed at 10/13/2023 0800       This score is the patient's risk of an unplanned readmission within 30 days of being discharged (0 -100%). The score is based on dignosis, age, lab data, medications, orders, and past utilization.   Low:  0-14.9   Medium: 15-21.9   High: 22-29.9   Extreme: 30 and above          Admitted From: Home Disposition: Home  Recommendations for Outpatient Follow-up:  Follow up with PCP in 1-2 weeks Please obtain BMP/CBC in one week Follow-up with cardiology in 1 to 2 weeks Please follow up with your PCP on the following pending results: Unresulted Labs (From admission, onward)     Start     Ordered   10/12/23 0500  Basic metabolic panel  Daily,   R     Question:  Specimen collection method  Answer:  Unit=Unit collect   10/11/23 0808   10/03/23 0500  Magnesium  Daily,   R     Question:  Specimen collection method  Answer:  Unit=Unit collect   10/02/23 0741              Home Health: Yes Equipment/Devices: None  Discharge Condition: Stable CODE STATUS: Full code Diet recommendation: Cardiac/low-sodium  Subjective: Seen and examined.  She has no complaints.  She tells me that cardiology told her that she will go home today and she wants to go home today.  Brief/Interim Summary: 47/F w morbid obesity, chronic systolic CHF, HTN, HLD, IDDM, hypothyroidism, prior DVT/PE, history of Candida mitral valve endocarditis in 2022, pulmonary hypertension, CKD 3B, right BKA, prior CVA, presented to ED with worsening abdominal distention, lower extremity swelling and shortness of breath.  -recently  hospitalized and discharged in January 2025 for systolic CHF, required diuresis with milrinone.  -In the ED she was found to have significant volume overload, acute kidney injury, and hyperkalemia.  -placed on IV furosemide, and inotropic support with milrinone.  -3/07 transferred to [facility] -3/9-14: Continued on high-dose diuretics -3/13: Palliative care meeting, wishes for full code, full scope Was on on Lasix gtt, transition to torsemide 10/11/2023. and milrinone which was stopped 10/12/2023, CVP remains elevated   Acute on chronic systolic heart failure (HCC) BiV Failure Pulm HTN -Echo w/ EF 25 to 30%, severe LV cavity dilatation, RV moderately reduced, RVSP 78.2. Patient was started on milrinone as well as furosemide infusion. -GDMT limited by AKI/CKD -Palliative consulted for goals of care, overall poor prognosis with limited support> patient wishes for full code and full scope -Adamantly declines SNF.  Milrinone stopped, transitioned from IV Lasix to torsemide.  She is also on Aldactone. Saturating normal on room air.  Cardiology has cleared her for discharge.  We will remove Foley catheter today and await successful voiding before she goes home today. Cards/ Dr. Gala Romney recommended following meds at dc. No rec for resuming jardiance so discontinuing that.  Torsemide 100 bid. Metolazone 5mg  Monday and Friday with kcl 40 extra on those days  Potassium 20 bid Cleda Daub 25 daily  Eliquis 5 bid Zetia 10  Atorva 40 daily  Mexilitene 250 bid   Hypokalemia, hypomagnesemia, hyponatremia Resolved/normalized.  Diarrhea Improved on Imodium.   Acute on chronic anemia -Prior anemia panel with iron deficiency, given IV iron 3/16   Frequent PVC,  -continue mexiletine, monitor lytes   AKI on CKD4 Baseline creatinine is 2.0.   Creatinine was 3.2 on admission.  Improved and has been stable between 2.2-2.4.  Continue to monitor urine output.  Avoid nephrotoxic agents.     History of pulmonary  embolism Continue apixaban.    Type 2 diabetes mellitus  -Only on SSI, blood sugar controlled.  Discontinue glargine at discharge.   Hx of right BKA (HCC) PT and OT consulted, lives alone, uses a power chair at baseline and transfers to the toilet, adamantly declines SNF, she has a home health aide   Hypothyroidism Continue with levothyroxine    Morbid Obesity OSA/OHS -declined sleep study, needs significant wt loss -Attempt nighttime BiPAP   History of CVA (cerebrovascular accident) -Continue apixaban and statin   Anxiety and depression Continue with as needed hydroxyzine   Discharge plan was discussed with patient and/or family member and they verbalized understanding and agreed with it.  Discharge Diagnoses:  Principal Problem:   Acute on chronic systolic heart failure (HCC) Active Problems:   Acute kidney injury superimposed on stage 4 chronic kidney disease (HCC)   History of pulmonary embolism   Type 2 diabetes mellitus with hyperlipidemia (HCC)   Hx of right BKA (HCC)   Hypothyroidism   BMI 60.0-69.9, adult (HCC)   History of CVA (cerebrovascular accident)   Anxiety and depression    Discharge Instructions   Allergies as of 10/13/2023       Reactions   Chlorhexidine Dermatitis, Rash   ALLERGY TO CHG WIPES   Clindamycin Diarrhea, Nausea And Vomiting   Zofran Itching, Nausea And Vomiting   Doxycycline Diarrhea, Nausea And Vomiting   Morphine And Codeine Hives, Itching, Rash        Medication List     STOP taking these medications    Jardiance 25 MG Tabs tablet Generic drug: empagliflozin   Lantus SoloStar 100 UNIT/ML Solostar Pen Generic drug: insulin glargine   potassium chloride 10 MEQ tablet Commonly known as: KLOR-CON       TAKE these medications    apixaban 5 MG Tabs tablet Commonly known as: ELIQUIS Take 1 tablet (5 mg total) by mouth 2 (two) times daily.   atorvastatin 40 MG tablet Commonly known as: LIPITOR Take 40 mg by  mouth every evening. What changed: Another medication with the same name was removed. Continue taking this medication, and follow the directions you see here.   ezetimibe 10 MG tablet Commonly known as: ZETIA Take 10 mg by mouth daily. What changed: Another medication with the same name was removed. Continue taking this medication, and follow the directions you see here.   FreeStyle Libre 3 Sensor Misc Place 1 sensor on the skin every 14 days. Use to check glucose continuously   glucose blood test strip Use as instructed   glucose monitoring kit monitoring kit 1 each by Does not apply route as needed for other.   hydrOXYzine 50 MG capsule Commonly known as: VISTARIL Take 50 mg by mouth 2 (two) times daily as needed for itching or anxiety.   insulin lispro 100 UNIT/ML injection Commonly known as: HUMALOG Inject 1-6 Units into the skin See admin instructions. Inject 1-6 units into the skin three times a day with meals, PER SLIDING SCALE   levothyroxine 75 MCG tablet Commonly known as: SYNTHROID Take 75 mcg  by mouth daily before breakfast.   methocarbamol 750 MG tablet Commonly known as: ROBAXIN Take 750 mg by mouth every 8 (eight) hours as needed for muscle spasms.   metolazone 5 MG tablet Commonly known as: ZAROXOLYN Take one tab on Mondays and one on Fridays only   mexiletine 250 MG capsule Commonly known as: MEXITIL Take 1 capsule (250 mg total) by mouth every 12 (twelve) hours.   minoxidil 10 MG tablet Commonly known as: LONITEN Take 5 mg by mouth daily.   Mounjaro 2.5 MG/0.5ML Pen Generic drug: tirzepatide Inject 2.5 mg into the skin every Wednesday.   Oxycodone HCl 20 MG Tabs Take 1 tablet (20 mg total) by mouth every 6 (six) hours as needed for severe pain (pain score 7-10). What changed:  medication strength how much to take reasons to take this   potassium chloride SA 20 MEQ tablet Commonly known as: KLOR-CON M Take 1 tablet (20 mEq total) by mouth 2  (two) times daily.   potassium chloride SA 20 MEQ tablet Commonly known as: KLOR-CON M Take 2 tablets (40 mEq total) by mouth 2 (two) times a week. Take one tablet each on Mondays and Fridays , on the days that you will take metolazone Start taking on: October 17, 2023   promethazine 25 MG tablet Commonly known as: PHENERGAN Take 25 mg by mouth every 6 (six) hours as needed for nausea or vomiting.   spironolactone 25 MG tablet Commonly known as: ALDACTONE Take 12.5 mg by mouth daily. What changed: Another medication with the same name was removed. Continue taking this medication, and follow the directions you see here.   torsemide 100 MG tablet Commonly known as: DEMADEX Take 1 tablet (100 mg total) by mouth 2 (two) times daily. What changed:  medication strength how much to take Another medication with the same name was removed. Continue taking this medication, and follow the directions you see here.   traZODone 100 MG tablet Commonly known as: DESYREL Take 100 mg by mouth at bedtime.   venlafaxine XR 75 MG 24 hr capsule Commonly known as: EFFEXOR-XR Take 75 mg by mouth daily with breakfast.        Follow-up Information     [facility] Follow up on 10/22/2023.   Specialty: Cardiology Why: Advanced Heart Failure Clinic 3:30 PM Contact information: [address] [place] [address] [phone]        [name], NP Follow up in 1 week(s).   Specialty: Adult Health Nurse Practitioner Contact information: [address] [place] [address] [phone]                Allergies  Allergen Reactions   Chlorhexidine Dermatitis and Rash    ALLERGY TO CHG WIPES   Clindamycin Diarrhea and Nausea And Vomiting   Zofran Itching and Nausea And Vomiting   Doxycycline Diarrhea and Nausea And Vomiting   Morphine And Codeine Hives, Itching and Rash    Consultations:  Cardiology   Procedures/Studies: ECHOCARDIOGRAM LIMITED Result Date: 09/25/2023    ECHOCARDIOGRAM LIMITED REPORT   Patient Name:   GRACI HULCE Date of Exam: 09/25/2023 Medical Rec #:  [id number]       Height:       66.0 in Accession #:    [id number]      Weight:       439.8 lb Date of Birth:  1975/09/30  BSA:          2.796 m Patient Age:    47 years        BP:           128/76 mmHg Patient Gender: F               HR:           88 bpm. Exam Location:  Inpatient Procedure: Limited Echo, Limited Color Doppler, Cardiac Doppler and Intracardiac            Opacification Agent (Both Spectral and Color Flow Doppler were            utilized during procedure). Indications:    Chest Pain  History:        Patient has prior history of Echocardiogram examinations, most                 recent 08/02/2023. CHF, Stroke; Risk Factors:Diabetes and Current                 Smoker.  Sonographer:    Amy Chionchio Referring Phys: [id number] MIHAI CROITORU IMPRESSIONS  1. LVEF now severely reduced 25-30%. Apical akinesis. Findings either represent stress-induced cardiomyopathy vs LAD infarction. Clinical correlation recommended. Left ventricular ejection fraction, by estimation, is 25 to 30%. The left ventricle has severely decreased function. The left ventricle demonstrates regional wall motion abnormalities (see scoring diagram/findings for description). The left ventricular internal cavity size was severely dilated. Indeterminate diastolic filling due to E-A fusion. There is the interventricular septum is flattened in systole, consistent with right ventricular pressure overload.  2. Right ventricular systolic function is moderately reduced. The right ventricular size is severely enlarged. There is severely elevated pulmonary artery systolic pressure. The estimated right ventricular systolic pressure is 78.2 mmHg.  3. Right atrial size was mildly dilated.  4. The mitral valve is grossly normal. Mild mitral valve regurgitation. No  evidence of mitral stenosis.  5. The inferior vena cava is dilated in size with >50% respiratory variability, suggesting right atrial pressure of 8 mmHg. Comparison(s): Changes from prior study are noted. LV function severely reduced 25-30% with apical WMA. RV severely dilated with moderately reduced function. FINDINGS  Left Ventricle: LVEF now severely reduced 25-30%. Apical akinesis. Findings either represent stress-induced cardiomyopathy vs LAD infarction. Clinical correlation recommended. Left ventricular ejection fraction, by estimation, is 25 to 30%. The left ventricle has severely decreased function. The left ventricle demonstrates regional wall motion abnormalities. Definity contrast agent was given IV to delineate the left ventricular endocardial borders. The left ventricular internal cavity size was severely dilated. There is no left ventricular hypertrophy. The interventricular septum is flattened in systole, consistent with right ventricular pressure overload. Indeterminate diastolic filling due to E-A fusion.  LV Wall Scoring: The entire apex is akinetic. Right Ventricle: The right ventricular size is severely enlarged. No increase in right ventricular wall thickness. Right ventricular systolic function is moderately reduced. There is severely elevated pulmonary artery systolic pressure. The tricuspid regurgitant velocity is 4.19 m/s, and with an assumed right atrial pressure of 8 mmHg, the estimated right ventricular systolic pressure is 78.2 mmHg. Right Atrium: Right atrial size was mildly dilated. Pericardium: There is no evidence of pericardial effusion. Mitral Valve: The mitral valve is grossly normal. Mild mitral valve regurgitation. No evidence of mitral valve stenosis. Tricuspid Valve: The tricuspid valve is grossly normal. Tricuspid valve regurgitation is mild . No evidence of tricuspid stenosis. Pulmonic Valve: The pulmonic valve was grossly normal. Pulmonic  valve regurgitation is mild. No  evidence of pulmonic stenosis. Aorta: The aortic root and ascending aorta are structurally normal, with no evidence of dilitation. Venous: The inferior vena cava is dilated in size with greater than 50% respiratory variability, suggesting right atrial pressure of 8 mmHg. Additional Comments: There is a small pleural effusion in the left lateral region.  LEFT VENTRICLE PLAX 2D LVIDd:         6.70 cm      Diastology LVIDs:         6.10 cm      LV e' lateral:   13.20 cm/s LV PW:         0.80 cm      LV E/e' lateral: 11.7 LV IVS:        0.70 cm LVOT diam:     2.00 cm LV SV:         59 LV SV Index:   21 LVOT Area:     3.14 cm  LV Volumes (MOD) LV vol d, MOD A2C: 271.0 ml LV vol d, MOD A4C: 239.0 ml LV vol s, MOD A2C: 150.0 ml LV vol s, MOD A4C: 131.0 ml LV SV MOD A2C:     121.0 ml LV SV MOD A4C:     239.0 ml LV SV MOD BP:      115.7 ml RIGHT VENTRICLE          IVC RV Basal diam:  5.30 cm  IVC diam: 2.10 cm RV Mid diam:    4.20 cm TAPSE (M-mode): 2.4 cm LEFT ATRIUM             Index        RIGHT ATRIUM           Index LA Vol (A2C):   62.6 ml 22.39 ml/m  RA Area:     28.60 cm LA Vol (A4C):   62.3 ml 22.28 ml/m  RA Volume:   106.00 ml 37.91 ml/m LA Biplane Vol: 68.3 ml 24.43 ml/m  AORTIC VALVE             PULMONIC VALVE LVOT Vmax:   125.00 cm/s PV Vmax:          1.34 m/s LVOT Vmean:  87.600 cm/s PV Peak grad:     7.2 mmHg LVOT VTI:    0.189 m     PR End Diast Vel: 16.81 msec  AORTA Ao Root diam: 2.90 cm Ao Asc diam:  3.30 cm MITRAL VALVE                  TRICUSPID VALVE MV Area (PHT): 4.52 cm       TR Peak grad:   70.2 mmHg MV Decel Time: 168 msec       TR Vmax:        419.00 cm/s MR Peak grad:    88.0 mmHg MR Mean grad:    56.0 mmHg    SHUNTS MR Vmax:         469.00 cm/s  Systemic VTI:  0.19 m MR Vmean:        350.0 cm/s   Systemic Diam: 2.00 cm MR PISA:         2.26 cm MR PISA Eff ROA: 19 mm MR PISA Radius:  0.60 cm MV E velocity: 155.00 cm/s MV A velocity: 75.90 cm/s MV E/A ratio:  2.04 Lennie Odor MD  Electronically signed by Lennie Odor MD Signature Date/Time: 09/25/2023/2:03:09 PM    Final  [place] EKG SITE RITE Result Date: 09/24/2023 If Site Rite image not attached, placement could not be confirmed due to current cardiac rhythm.  IR Fluoro Guide CV Line Right Result Date: 09/24/2023 INDICATION: ESRD.  Poor IV access. EXAM: * ABORTED * ULTRASOUND AND FLUOROSCOPIC GUIDED PLACEMENT OF TUNNELED CENTRAL VENOUS CATHETER MEDICATIONS: None. ANESTHESIA/SEDATION: None FLUOROSCOPY TIME:  Fluoroscopic dose; 0 mGy COMPLICATIONS: None immediate. PROCEDURE: Informed written consent was obtained from the patient after a discussion of the risks, benefits, and alternatives to treatment. Questions regarding the procedure were encouraged and answered. The RIGHT neck and chest were prepped with chlorhexidine in a sterile fashion, and a sterile drape was applied covering the operative field. Maximum barrier sterile technique with sterile gowns and gloves were used for the procedure. A timeout was performed prior to the initiation of the procedure. Prior to anesthetic administration, the patient became increasingly uncomfortable and requested the procedure to be stopped. The sterile drape port removed. Incision was not performed. FINDINGS: Preprocedural ultrasound with a widely patent RIGHT internal jugular vein. Incision was not performed IMPRESSION: Aborted placement of a tunneled central venous catheter, secondary to patient discomfort. Roanna Banning, MD [facility] Radiology Electronically Signed   By: Roanna Banning M.D.   On: 09/24/2023 14:01   [place] EKG SITE RITE Result Date: 09/22/2023 If Site Rite image not attached, placement could not be confirmed due to current cardiac rhythm.  DG Chest Portable 1 View Result Date: 09/22/2023 CLINICAL DATA:  Bilateral lower extremity edema. Shortness of breath EXAM: PORTABLE CHEST 1 VIEW COMPARISON:  08/01/2023 FINDINGS: Stable  cardiomegaly. Diffuse interstitial coarsening and pulmonary vascular congestion. Hazy opacities over the lower lungs likely due to layering pleural effusions. No pneumothorax. No displaced rib fractures. IMPRESSION: Congestive heart failure with layering bilateral pleural effusions. Electronically Signed   By: Minerva Fester M.D.   On: 09/22/2023 03:10     Discharge Exam: Vitals:   10/13/23 0449 10/13/23 0812  BP: (!) 101/54 119/69  Pulse: 85 84  Resp: 19   Temp: 97.7 F (36.5 C) 98.6 F (37 C)  SpO2: 92%    Vitals:   10/12/23 1933 10/12/23 2325 10/13/23 0449 10/13/23 0812  BP: 110/62 118/62 (!) 101/54 119/69  Pulse: 90 85 85 84  Resp: 18 20 19    Temp: 97.6 F (36.4 C) 98 F (36.7 C) 97.7 F (36.5 C) 98.6 F (37 C)  TempSrc: Oral Oral Oral Oral  SpO2: 92% 94% 92%   Weight:   (!) 195 kg   Height:        General: Pt is alert, awake, not in acute distress, morbidly obese Cardiovascular: RRR, S1/S2 +, no rubs, no gallops Respiratory: CTA bilaterally, no wheezing, no rhonchi Abdominal: Soft, NT, ND, bowel sounds + Extremities: no edema, no cyanosis    The results of significant diagnostics from this hospitalization (including imaging, microbiology, ancillary and laboratory) are listed below for reference.     Microbiology: No results found for this or any previous visit (from the past 240 hours).   Labs: BNP (last 3 results) Recent Labs    02/09/23 1736 08/01/23 1140 09/22/23 0215  BNP 345.1* 523.3* 572.3*   Basic Metabolic Panel: Recent Labs  Lab 10/09/23 0500 10/10/23 0400 10/11/23 0315 10/12/23 0505 10/13/23 0500  NA 133* 133* 129* 132* 134*  K 3.4* 4.3 3.5 3.8 3.9  CL 96* 98 92* 95* 96*  CO2 25 26 27 27 26   GLUCOSE 114* 148* 198* 142* 113*  BUN 72* 71* 72* 73* 74*  CREATININE 2.13* 2.22* 2.23* 2.39* 2.38*  CALCIUM 8.0* 8.3* 8.3* 8.4* 8.7*  MG 1.9 2.2 2.1 2.1 2.1   Liver Function Tests: No results for input(s): "AST", "ALT", "ALKPHOS",  "BILITOT", "PROT", "ALBUMIN" in the last 168 hours. No results for input(s): "LIPASE", "AMYLASE" in the last 168 hours. No results for input(s): "AMMONIA" in the last 168 hours. CBC: Recent Labs  Lab 10/07/23 0500 10/08/23 0400 10/12/23 0505  WBC 4.1 4.6 4.3  HGB 8.9* 9.6* 9.1*  HCT 28.7* 31.5* 30.0*  MCV 86.7 87.5 88.2  PLT 253 294 250   Cardiac Enzymes: No results for input(s): "CKTOTAL", "CKMB", "CKMBINDEX", "TROPONINI" in the last 168 hours. BNP: Invalid input(s): "POCBNP" CBG: Recent Labs  Lab 10/12/23 1129 10/12/23 1659 10/12/23 2101 10/13/23 0624 10/13/23 1144  GLUCAP 115* 137* 115* 99 175*   D-Dimer No results for input(s): "DDIMER" in the last 72 hours. Hgb A1c No results for input(s): "HGBA1C" in the last 72 hours. Lipid Profile No results for input(s): "CHOL", "HDL", "LDLCALC", "TRIG", "CHOLHDL", "LDLDIRECT" in the last 72 hours. Thyroid function studies No results for input(s): "TSH", "T4TOTAL", "T3FREE", "THYROIDAB" in the last 72 hours.  Invalid input(s): "FREET3" Anemia work up No results for input(s): "VITAMINB12", "FOLATE", "FERRITIN", "TIBC", "IRON", "RETICCTPCT" in the last 72 hours. Urinalysis    Component Value Date/Time   COLORURINE AMBER (A) 03/22/2023 0426   APPEARANCEUR TURBID (A) 03/22/2023 0426   LABSPEC 1.040 (H) 03/22/2023 0426   PHURINE 5.0 03/22/2023 0426   GLUCOSEU >=500 (A) 03/22/2023 0426   HGBUR LARGE (A) 03/22/2023 0426   BILIRUBINUR NEGATIVE 03/22/2023 0426   KETONESUR NEGATIVE 03/22/2023 0426   PROTEINUR >=300 (A) 03/22/2023 0426   UROBILINOGEN 0.2 09/05/2011 0558   NITRITE POSITIVE (A) 03/22/2023 0426   LEUKOCYTESUR MODERATE (A) 03/22/2023 0426   Sepsis Labs Recent Labs  Lab 10/07/23 0500 10/08/23 0400 10/12/23 0505  WBC 4.1 4.6 4.3   Microbiology No results found for this or any previous visit (from the past 240 hours).  FURTHER DISCHARGE INSTRUCTIONS:   Get Medicines reviewed and adjusted: Please take all  your medications with you for your next visit with your Primary MD   Laboratory/radiological data: Please request your Primary MD to go over all hospital tests and procedure/radiological results at the follow up, please ask your Primary MD to get all Hospital records sent to his/her office.   In some cases, they will be blood work, cultures and biopsy results pending at the time of your discharge. Please request that your primary care M.D. goes through all the records of your hospital data and follows up on these results.   Also Note the following: If you experience worsening of your admission symptoms, develop shortness of breath, life threatening emergency, suicidal or homicidal thoughts you must seek medical attention immediately by calling 911 or calling your MD immediately  if symptoms less severe.   You must read complete instructions/literature along with all the possible adverse reactions/side effects for all the Medicines you take and that have been prescribed to you. Take any new Medicines after you have completely understood and accpet all the possible adverse reactions/side effects.    Do not drive when taking Pain medications or sleeping medications (Benzodaizepines)   Do not take more than prescribed Pain, Sleep and Anxiety Medications. It is not advisable to combine anxiety,sleep and pain medications without talking with your primary care practitioner   Special Instructions: If you have smoked or chewed  Tobacco  in the last 2 yrs please stop smoking, stop any regular Alcohol  and or any Recreational drug use.   Wear Seat belts while driving.   Please note: You were cared for by a hospitalist during your hospital stay. Once you are discharged, your primary care physician will handle any further medical issues. Please note that NO REFILLS for any discharge medications will be authorized once you are discharged, as it is imperative that you return to your primary care physician (or  establish a relationship with a primary care physician if you do not have one) for your post hospital discharge needs so that they can reassess your need for medications and monitor your lab values  Time coordinating discharge: Over 30 minutes  SIGNED:   Hughie Closs, MD  [facility] 10/13/2023, 12:53 PM *Please note that this is a verbal dictation therefore any spelling or grammatical errors are due to the "Dragon [employer] One" system interpretation. If 7PM-7AM, please contact night-coverage [email or web address]

## 2023-10-13 NOTE — Plan of Care (Signed)
  Problem: Fluid Volume: Goal: Ability to maintain a balanced intake and output will improve Outcome: Progressing   Problem: Health Behavior/Discharge Planning: Goal: Ability to manage health-related needs will improve Outcome: Progressing   Problem: Skin Integrity: Goal: Risk for impaired skin integrity will decrease Outcome: Progressing

## 2023-10-13 NOTE — TOC Transition Note (Addendum)
 Transition of Care [facility]) - Discharge Note   Patient Details  Name: Karen Bennett MRN: [id number] Date of Birth: [birth year]  Transition of Care [facility]) CM/SW Contact:  Ronny Bacon, RN Phone Number: 10/13/2023, 9:34 AM   Clinical Narrative:   Patient is being discharged today. Katina with [facility] made aware.    1020:  PTAR forms sent to unit and PTAR arranged for 3 pm pick up per provider request.    Final next level of care: Home w Home Health Services Barriers to Discharge: No Barriers Identified   Patient Goals and CMS Choice   CMS Medicare.gov Compare Post Acute Care list provided to:: Patient Choice offered to / list presented to : Patient      Discharge Placement                       Discharge Plan and Services Additional resources added to the After Visit Summary for     Discharge Planning Services: CM Consult Post Acute Care Choice: Home Health                    HH Arranged: RN          Social Drivers of Health (SDOH) Interventions SDOH Screenings   Food Insecurity: No Food Insecurity (09/22/2023)  Housing: Low Risk  (09/22/2023)  Transportation Needs: No Transportation Needs (09/22/2023)  Utilities: Not At Risk (09/22/2023)  Financial Resource Strain: Low Risk  (09/07/2023)   Received from [facility]  Physical Activity: Sufficiently Active (05/07/2023)   Received from [facility]  Social Connections: Moderately Integrated (05/07/2023)   Received from [facility]  Stress: Stress Concern Present (05/07/2023)   Received from [facility]  Tobacco Use: High Risk (09/22/2023)     Readmission Risk Interventions    09/24/2023   11:10 AM 08/02/2023    1:28 PM 02/13/2023   11:23 AM  Readmission Risk Prevention Plan  Transportation Screening Complete Complete Complete  PCP or Specialist Appt within 3-5 Days   Complete  HRI or Home Care Consult   Complete  Social Work Consult for Recovery Care Planning/Counseling   Complete   Palliative Care Screening   Complete  Medication Review Oceanographer) Complete Complete Complete  PCP or Specialist appointment within 3-5 days of discharge  Complete   HRI or Home Care Consult Complete Complete   SW Recovery Care/Counseling Consult Complete Complete   Palliative Care Screening Not Applicable Not Applicable   Skilled Nursing Facility Not Applicable Not Applicable

## 2023-10-13 NOTE — Progress Notes (Signed)
 Patient ID: Karen Bennett, female   DOB: 10-Jan-1976, 48 y.o.   MRN: [id number]     Advanced Heart Failure Rounding Note  Cardiologist: Chilton Si, MD  Chief Complaint: Fatigue Subjective:    Off milrinone. On po diuretics. Weight up 4 pounds.   Sitting up in bed eating.   Denies CP or SOB Eager to go home   Objective:   Weight Range: (!) 195 kg Body mass index is 69.39 kg/m.   Vital Signs:   Temp:  [97.5 F (36.4 C)-98.6 F (37 C)] 98.6 F (37 C) (03/22 0812) Pulse Rate:  [84-94] 84 (03/22 0812) Resp:  [18-20] 19 (03/22 0449) BP: (101-119)/(54-69) 119/69 (03/22 0812) SpO2:  [92 %-94 %] 92 % (03/22 0449) Weight:  [195 kg] 195 kg (03/22 0449) Last BM Date : 10/11/23  Weight change: Filed Weights   10/11/23 0304 10/12/23 0500 10/13/23 0449  Weight: (!) 193.1 kg (!) 192.6 kg (!) 195 kg    Intake/Output:   Intake/Output Summary (Last 24 hours) at 10/13/2023 1122 Last data filed at 10/13/2023 0817 Gross per 24 hour  Intake 120 ml  Output 1650 ml  Net -1530 ml     Physical Exam   General:  Sitting up in bed eating No resp difficulty HEENT: normal Neck: supple. JVP to jaw Carotids 2+ bilat; no bruits. No lymphadenopathy or thryomegaly appreciated. Cor: Regular rate & rhythm. No rubs, gallops or murmurs. Lungs: clear Abdomen: markedly obese  soft, nontender, nondistended. Good bowel sounds. Extremities: no cyanosis, clubbing, rash, tr-1+ edema on LLE + UNNA s/p RBKA Neuro: alert & orientedx3, cranial nerves grossly intact. moves all 4 extremities w/o difficulty. Affect pleasant   Telemetry   SR 80s Personally reviewed   Labs    CBC Recent Labs    10/12/23 0505  WBC 4.3  HGB 9.1*  HCT 30.0*  MCV 88.2  PLT 250     Basic Metabolic Panel Recent Labs    40/98/11 0505 10/13/23 0500  NA 132* 134*  K 3.8 3.9  CL 95* 96*  CO2 27 26  GLUCOSE 142* 113*  BUN 73* 74*  CREATININE 2.39* 2.38*  CALCIUM 8.4* 8.7*  MG 2.1 2.1   Liver Function  Tests No results for input(s): "AST", "ALT", "ALKPHOS", "BILITOT", "PROT", "ALBUMIN" in the last 72 hours. No results for input(s): "LIPASE", "AMYLASE" in the last 72 hours. Cardiac Enzymes No results for input(s): "CKTOTAL", "CKMB", "CKMBINDEX", "TROPONINI" in the last 72 hours.  BNP: BNP (last 3 results) Recent Labs    02/09/23 1736 08/01/23 1140 09/22/23 0215  BNP 345.1* 523.3* 572.3*    ProBNP (last 3 results) No results for input(s): "PROBNP" in the last 8760 hours.   D-Dimer No results for input(s): "DDIMER" in the last 72 hours. Hemoglobin A1C No results for input(s): "HGBA1C" in the last 72 hours. Fasting Lipid Panel No results for input(s): "CHOL", "HDL", "LDLCALC", "TRIG", "CHOLHDL", "LDLDIRECT" in the last 72 hours. Thyroid Function Tests No results for input(s): "TSH", "T4TOTAL", "T3FREE", "THYROIDAB" in the last 72 hours.  Invalid input(s): "FREET3"  Other results:   Imaging    No results found.   Medications:     Scheduled Medications:  apixaban  5 mg Oral BID   atorvastatin  40 mg Oral QPM   clotrimazole  1 Application Topical BID   diphenhydrAMINE  12.5 mg Intravenous Once   ezetimibe  10 mg Oral Daily   insulin aspart  0-15 Units Subcutaneous TID WC   insulin aspart  0-5 Units Subcutaneous QHS   levothyroxine  75 mcg Oral QAC breakfast   metolazone  10 mg Oral BID   mexiletine  250 mg Oral Q12H   sodium chloride flush  10-40 mL Intracatheter Q12H   spironolactone  25 mg Oral Daily   torsemide  100 mg Oral BID    Infusions:    PRN Medications: acetaminophen **OR** acetaminophen, alum & mag hydroxide-simeth, Gerhardt's butt cream, hydrOXYzine, loperamide, mineral oil-hydrophilic petrolatum, mouth rinse, oxyCODONE, sodium chloride flush    Patient Profile   [name] with CKD stage III, hypothyroidism, mitral valve endocarditis in 2022, history of CVA, right BKA, severe morbid obesity (BMI 70) and chronic biventricular failure  currently admitted with heart failure exacerbation.   Assessment/Plan   1. A/C Biventricular HF -Previous Echo EF 35-40% EF but now 25-30% + RV failure.  -Etiology unclear. She has not had ischemic work up due to weight/CKD. ? PVCs. Now well suppressed with mexiletine. -Readmitted with marked volume overload. Poor response with intermittent IV lasix and IV bumex. Milrinone added to augment diuresis.  - Has diuresed about 20 pounds with milrinone support - Unfortunately she has very high food and fluid intake and hard to keep up with her intake - Currently as good as I think we can get her.  - Will plan d/c today. - Meds adjusted as below.  - Long discussion about need for fluid, salt and calorie modification - not canddiate for home inotropes or advanced therapies   2. CKD  Stage IV on AKI  - SCr 3.2 on admit, b/l ~2.0  - SCr stable at 2.4  - Follow with discontinuation of milrinone  3. Hypothyroidism  - Recent TSH stable.    4. PVCs  -Continue mexiletine 250 every 12 hours.  -Well suppressed   5. H/O PE/DVT - Continue eliquis 5 mg bid.  - no bleeding   6.Suspected OHS/OSA - Refuses sleep study   7. Severe Morbid Obesity.  -DMII.  -HGb A1C 7.2  -Body mass index is 69.39 kg/m.   8. Right BKA   9. Hypokalemia - K 3.9 today -Continue  spiro to 25 mg daily.    10. GOC Palliative Care following. Plan for outpatient follow up. If renal function or volume status worsens off milrinone will need to consider transition to hospice care.  Disposition: Planning HH /[facility] for Palliative Care. - Refuses SNF  D/c HF meds   Torsemide 100 bid. Metolazone 5mg  Monday and Friday with kcl 40 extra on those days  Potassium 20 bid Cleda Daub 25 daily  Eliquis 5 bid Zetia 10  Atorva 40 daily  Mexilitene 250 bid  Length of Stay: 21  Arvilla Meres, MD  10/13/2023, 11:22 AM  Advanced Heart Failure Team Pager [phone] (M-F; 7a - 5p)  Please contact [facility] for night-coverage after hours (5p -7a ) and weekends on [email or web address]

## 2023-10-19 ENCOUNTER — Telehealth (HOSPITAL_COMMUNITY): Payer: Self-pay

## 2023-10-19 NOTE — Telephone Encounter (Signed)
 Called to confirm/remind patient of their appointment at the [facility] on 10/22/2023 3:30.   Appointment:   [] Confirmed  [x] Left mess   [] No answer/No voice mail  [] Phone not in service  Patient reminded to bring all medications and/or complete list.  Confirmed patient has transportation. Gave directions, instructed to utilize valet parking.

## 2023-10-22 ENCOUNTER — Encounter (HOSPITAL_COMMUNITY)

## 2023-10-23 DEATH — deceased

## 2023-11-15 ENCOUNTER — Encounter: Payer: Self-pay | Admitting: Cardiovascular Disease

## 2024-02-08 ENCOUNTER — Encounter: Payer: Self-pay | Admitting: Advanced Practice Midwife
